# Patient Record
Sex: Female | Born: 1945
Health system: Southern US, Community
[De-identification: ages and names within clinical notes are randomized; demographics above are authoritative.]

## PROBLEM LIST (undated history)

## (undated) DIAGNOSIS — R609 Edema, unspecified: Secondary | ICD-10-CM

## (undated) DIAGNOSIS — J449 Chronic obstructive pulmonary disease, unspecified: Secondary | ICD-10-CM

## (undated) DIAGNOSIS — I509 Heart failure, unspecified: Secondary | ICD-10-CM

## (undated) DIAGNOSIS — R0902 Hypoxemia: Secondary | ICD-10-CM

## (undated) DIAGNOSIS — R29898 Other symptoms and signs involving the musculoskeletal system: Secondary | ICD-10-CM

## (undated) DIAGNOSIS — R062 Wheezing: Secondary | ICD-10-CM

## (undated) DIAGNOSIS — F419 Anxiety disorder, unspecified: Secondary | ICD-10-CM

## (undated) DIAGNOSIS — I10 Essential (primary) hypertension: Secondary | ICD-10-CM

## (undated) DIAGNOSIS — E039 Hypothyroidism, unspecified: Secondary | ICD-10-CM

## (undated) DIAGNOSIS — M1990 Unspecified osteoarthritis, unspecified site: Secondary | ICD-10-CM

## (undated) DIAGNOSIS — G2581 Restless legs syndrome: Secondary | ICD-10-CM

## (undated) DIAGNOSIS — M810 Age-related osteoporosis without current pathological fracture: Secondary | ICD-10-CM

## (undated) DIAGNOSIS — G4730 Sleep apnea, unspecified: Secondary | ICD-10-CM

## (undated) DIAGNOSIS — R0602 Shortness of breath: Secondary | ICD-10-CM

## (undated) HISTORY — PX: FOOT ARTHROPLASTY: SHX1657

## (undated) HISTORY — PX: BACK SURGERY: SHX140

## (undated) HISTORY — PX: JOINT REPLACEMENT: SHX530

## (undated) HISTORY — PX: BREAST BIOPSY: SHX20

---

## 2005-10-16 ENCOUNTER — Ambulatory Visit: Payer: Self-pay

## 2006-01-02 ENCOUNTER — Ambulatory Visit: Payer: Self-pay | Admitting: Internal Medicine

## 2006-02-19 ENCOUNTER — Inpatient Hospital Stay (HOSPITAL_COMMUNITY): Admission: RE | Admit: 2006-02-19 | Discharge: 2006-02-21 | Payer: Self-pay | Admitting: Neurosurgery

## 2007-10-10 ENCOUNTER — Ambulatory Visit: Payer: Self-pay

## 2008-12-01 ENCOUNTER — Ambulatory Visit: Payer: Self-pay

## 2009-01-20 ENCOUNTER — Ambulatory Visit: Payer: Self-pay | Admitting: General Practice

## 2009-02-04 ENCOUNTER — Ambulatory Visit: Payer: Self-pay | Admitting: General Practice

## 2009-08-08 ENCOUNTER — Ambulatory Visit: Payer: Self-pay | Admitting: General Practice

## 2009-08-22 ENCOUNTER — Inpatient Hospital Stay: Payer: Self-pay | Admitting: General Practice

## 2010-11-08 ENCOUNTER — Ambulatory Visit: Payer: Self-pay | Admitting: General Practice

## 2010-11-22 ENCOUNTER — Inpatient Hospital Stay: Payer: Self-pay | Admitting: General Practice

## 2011-02-28 ENCOUNTER — Ambulatory Visit: Payer: Self-pay | Admitting: Internal Medicine

## 2013-01-01 ENCOUNTER — Inpatient Hospital Stay: Payer: Self-pay | Admitting: Internal Medicine

## 2013-01-01 LAB — CK TOTAL AND CKMB (NOT AT ARMC): CK-MB: 2.1 ng/mL (ref 0.5–3.6)

## 2013-01-01 LAB — CBC WITH DIFFERENTIAL/PLATELET
Basophil #: 0 10*3/uL (ref 0.0–0.1)
Basophil %: 0.4 %
Eosinophil %: 2.3 %
HCT: 42.6 % (ref 35.0–47.0)
Lymphocyte %: 9.1 %
MCH: 29.5 pg (ref 26.0–34.0)
MCV: 89 fL (ref 80–100)
Neutrophil #: 8.3 10*3/uL — ABNORMAL HIGH (ref 1.4–6.5)
Platelet: 242 10*3/uL (ref 150–440)
RBC: 4.81 10*6/uL (ref 3.80–5.20)
WBC: 10.2 10*3/uL (ref 3.6–11.0)

## 2013-01-01 LAB — COMPREHENSIVE METABOLIC PANEL
Alkaline Phosphatase: 154 U/L — ABNORMAL HIGH (ref 50–136)
BUN: 17 mg/dL (ref 7–18)
Co2: 37 mmol/L — ABNORMAL HIGH (ref 21–32)
EGFR (African American): >60
Glucose: 101 mg/dL — ABNORMAL HIGH (ref 65–99)
Osmolality: 272 (ref 275–301)
Potassium: 2.9 mmol/L — ABNORMAL LOW (ref 3.5–5.1)
SGOT(AST): 74 U/L — ABNORMAL HIGH (ref 15–37)
SGPT (ALT): 49 U/L (ref 12–78)
Sodium: 136 mmol/L (ref 136–145)
Total Protein: 6.8 g/dL (ref 6.4–8.2)

## 2013-01-01 LAB — TSH: Thyroid Stimulating Horm: 1.66 u[IU]/mL

## 2013-01-02 LAB — URINALYSIS, COMPLETE
Bacteria: NONE SEEN
Blood: NEGATIVE
Glucose,UR: NEGATIVE mg/dL (ref 0–75)
Ketone: NEGATIVE
Leukocyte Esterase: NEGATIVE
Ph: 7 (ref 4.5–8.0)
Protein: NEGATIVE
RBC,UR: <1 /HPF (ref 0–5)
Specific Gravity: 1.014 (ref 1.003–1.030)
Squamous Epithelial: 1
WBC UR: 1 /HPF (ref 0–5)

## 2013-01-02 LAB — CBC WITH DIFFERENTIAL/PLATELET
Eosinophil #: 0 10*3/uL (ref 0.0–0.7)
Eosinophil %: 0.1 %
HCT: 44.5 % (ref 35.0–47.0)
HGB: 14.6 g/dL (ref 12.0–16.0)
Lymphocyte #: 0.4 10*3/uL — ABNORMAL LOW (ref 1.0–3.6)
MCHC: 32.9 g/dL (ref 32.0–36.0)
Monocyte %: 0.9 %
Neutrophil #: 11.7 10*3/uL — ABNORMAL HIGH (ref 1.4–6.5)
Neutrophil %: 94.2 %
RBC: 5 10*6/uL (ref 3.80–5.20)
RDW: 16.6 % — ABNORMAL HIGH (ref 11.5–14.5)

## 2013-01-02 LAB — CK TOTAL AND CKMB (NOT AT ARMC)
CK, Total: 25 U/L (ref 21–215)
CK-MB: 1.3 ng/mL (ref 0.5–3.6)

## 2013-01-02 LAB — BASIC METABOLIC PANEL
Anion Gap: 3 — ABNORMAL LOW (ref 7–16)
BUN: 16 mg/dL (ref 7–18)
EGFR (Non-African Amer.): >60
Glucose: 191 mg/dL — ABNORMAL HIGH (ref 65–99)
Osmolality: 276 (ref 275–301)
Sodium: 135 mmol/L — ABNORMAL LOW (ref 136–145)

## 2013-01-02 LAB — TROPONIN I
Troponin-I: 0.04 ng/mL
Troponin-I: <0.02 ng/mL

## 2013-01-03 LAB — BASIC METABOLIC PANEL
Calcium, Total: 8.5 mg/dL (ref 8.5–10.1)
Co2: 40 mmol/L (ref 21–32)
Creatinine: 0.9 mg/dL (ref 0.60–1.30)
EGFR (Non-African Amer.): >60
Sodium: 138 mmol/L (ref 136–145)

## 2013-01-04 LAB — CBC WITH DIFFERENTIAL/PLATELET
Basophil #: 0 10*3/uL (ref 0.0–0.1)
HCT: 41 % (ref 35.0–47.0)
HGB: 13.2 g/dL (ref 12.0–16.0)
MCH: 28.9 pg (ref 26.0–34.0)
MCV: 90 fL (ref 80–100)
Monocyte #: 0.7 x10 3/mm (ref 0.2–0.9)

## 2013-01-04 LAB — COMPREHENSIVE METABOLIC PANEL
Albumin: 3 g/dL — ABNORMAL LOW (ref 3.4–5.0)
Alkaline Phosphatase: 117 U/L (ref 50–136)
Anion Gap: 2 — ABNORMAL LOW (ref 7–16)
BUN: 22 mg/dL — ABNORMAL HIGH (ref 7–18)
Bilirubin,Total: 0.3 mg/dL (ref 0.2–1.0)
Co2: 40 mmol/L (ref 21–32)
Creatinine: 0.6 mg/dL (ref 0.60–1.30)
EGFR (Non-African Amer.): >60
Osmolality: 278 (ref 275–301)
SGOT(AST): 23 U/L (ref 15–37)
SGPT (ALT): 25 U/L (ref 12–78)
Sodium: 137 mmol/L (ref 136–145)

## 2013-01-04 LAB — MAGNESIUM: Magnesium: 2 mg/dL

## 2013-01-05 LAB — CBC WITH DIFFERENTIAL/PLATELET
Eosinophil #: 0.2 10*3/uL (ref 0.0–0.7)
Eosinophil %: 1.5 %
HCT: 42.2 % (ref 35.0–47.0)
HGB: 13.4 g/dL (ref 12.0–16.0)
MCV: 90 fL (ref 80–100)
Monocyte #: 1 x10 3/mm — ABNORMAL HIGH (ref 0.2–0.9)
Monocyte %: 8.5 %
Neutrophil #: 8.7 10*3/uL — ABNORMAL HIGH (ref 1.4–6.5)
Neutrophil %: 76.4 %
Platelet: 234 10*3/uL (ref 150–440)
RBC: 4.68 10*6/uL (ref 3.80–5.20)
RDW: 17 % — ABNORMAL HIGH (ref 11.5–14.5)
WBC: 11.4 10*3/uL — ABNORMAL HIGH (ref 3.6–11.0)

## 2013-01-05 LAB — BASIC METABOLIC PANEL
Anion Gap: 0 — ABNORMAL LOW (ref 7–16)
BUN: 25 mg/dL — ABNORMAL HIGH (ref 7–18)
Creatinine: 0.86 mg/dL (ref 0.60–1.30)
EGFR (African American): >60
EGFR (Non-African Amer.): >60
Osmolality: 286 (ref 275–301)
Potassium: 3.7 mmol/L (ref 3.5–5.1)
Sodium: 141 mmol/L (ref 136–145)

## 2013-01-06 LAB — CULTURE, BLOOD (SINGLE)

## 2013-05-29 ENCOUNTER — Ambulatory Visit: Payer: Self-pay | Admitting: Orthopedic Surgery

## 2013-06-30 ENCOUNTER — Other Ambulatory Visit (HOSPITAL_COMMUNITY): Payer: Self-pay

## 2013-07-01 ENCOUNTER — Encounter (HOSPITAL_COMMUNITY)
Admission: RE | Admit: 2013-07-01 | Discharge: 2013-07-01 | Disposition: A | Discharge status: Home / Self Care | Payer: Medicare HMO | Source: Ambulatory Visit | Attending: Orthopedic Surgery | Admitting: Orthopedic Surgery

## 2013-07-01 ENCOUNTER — Ambulatory Visit (HOSPITAL_COMMUNITY)
Admission: RE | Admit: 2013-07-01 | Discharge: 2013-07-01 | Disposition: A | Discharge status: Home / Self Care | Payer: Medicare HMO | Source: Ambulatory Visit | Attending: Orthopedic Surgery | Admitting: Orthopedic Surgery

## 2013-07-01 ENCOUNTER — Encounter (HOSPITAL_COMMUNITY): Payer: Self-pay

## 2013-07-01 ENCOUNTER — Encounter (HOSPITAL_COMMUNITY): Payer: Self-pay | Admitting: Pharmacy Technician

## 2013-07-01 DIAGNOSIS — Z01818 Encounter for other preprocedural examination: Secondary | ICD-10-CM | POA: Insufficient documentation

## 2013-07-01 HISTORY — DX: Sleep apnea, unspecified: G47.30

## 2013-07-01 HISTORY — DX: Unspecified osteoarthritis, unspecified site: M19.90

## 2013-07-01 HISTORY — DX: Hypothyroidism, unspecified: E03.9

## 2013-07-01 HISTORY — DX: Restless legs syndrome: G25.81

## 2013-07-01 HISTORY — DX: Heart failure, unspecified: I50.9

## 2013-07-01 HISTORY — DX: Essential (primary) hypertension: I10

## 2013-07-01 HISTORY — DX: Anxiety disorder, unspecified: F41.9

## 2013-07-01 HISTORY — DX: Shortness of breath: R06.02

## 2013-07-01 LAB — CBC WITH DIFFERENTIAL/PLATELET
Basophils Absolute: 0 10*3/uL (ref 0.0–0.1)
Basophils Relative: 0 % (ref 0–1)
Eosinophils Absolute: 0.2 10*3/uL (ref 0.0–0.7)
Eosinophils Relative: 2 % (ref 0–5)
HCT: 45.2 % (ref 36.0–46.0)
Hemoglobin: 14.8 g/dL (ref 12.0–15.0)
Lymphocytes Relative: 12 % (ref 12–46)
Lymphs Abs: 1 10*3/uL (ref 0.7–4.0)
MCH: 31.2 pg (ref 26.0–34.0)
MCHC: 32.7 g/dL (ref 30.0–36.0)
MCV: 95.2 fL (ref 78.0–100.0)
Monocytes Absolute: 0.5 10*3/uL (ref 0.1–1.0)
Monocytes Relative: 6 % (ref 3–12)
Neutro Abs: 6.9 10*3/uL (ref 1.7–7.7)
Neutrophils Relative %: 80 % — ABNORMAL HIGH (ref 43–77)
Platelets: 224 10*3/uL (ref 150–400)
RBC: 4.75 MIL/uL (ref 3.87–5.11)
RDW: 14.4 % (ref 11.5–15.5)
WBC: 8.7 10*3/uL (ref 4.0–10.5)

## 2013-07-01 LAB — COMPREHENSIVE METABOLIC PANEL
ALT: 13 U/L (ref 0–35)
AST: 18 U/L (ref 0–37)
Albumin: 4.1 g/dL (ref 3.5–5.2)
Alkaline Phosphatase: 149 U/L — ABNORMAL HIGH (ref 39–117)
BUN: 22 mg/dL (ref 6–23)
CO2: 30 mEq/L (ref 19–32)
Calcium: 10.3 mg/dL (ref 8.4–10.5)
Chloride: 101 mEq/L (ref 96–112)
Creatinine, Ser: 0.73 mg/dL (ref 0.50–1.10)
GFR calc Af Amer: >90 mL/min (ref >90)
GFR calc non Af Amer: 87 mL/min — ABNORMAL LOW (ref >90)
Glucose, Bld: 99 mg/dL (ref 70–99)
Potassium: 4.7 mEq/L (ref 3.7–5.3)
Sodium: 144 mEq/L (ref 137–147)
Total Bilirubin: 0.4 mg/dL (ref 0.3–1.2)
Total Protein: 7.2 g/dL (ref 6.0–8.3)

## 2013-07-01 LAB — PROTIME-INR
INR: 0.99 (ref 0.00–1.49)
Prothrombin Time: 12.9 seconds (ref 11.6–15.2)

## 2013-07-01 LAB — TYPE AND SCREEN
ABO/RH(D): O NEG
ANTIBODY SCREEN: NEGATIVE

## 2013-07-01 LAB — APTT: aPTT: 31 seconds (ref 24–37)

## 2013-07-01 LAB — ABO/RH: ABO/RH(D): O NEG

## 2013-07-01 MED ORDER — LACTATED RINGERS IV SOLN
INTRAVENOUS | Status: DC
  Administered 2013-07-02 (×2): via INTRAVENOUS

## 2013-07-01 MED ORDER — CHLORHEXIDINE GLUCONATE 4 % EX LIQD
60.0000 mL | Freq: Once | CUTANEOUS | Status: DC
  Filled 2013-07-01: qty 60

## 2013-07-01 MED ORDER — DEXTROSE 5 % IV SOLN
3.0000 g | INTRAVENOUS | Status: AC
  Administered 2013-07-02: 3 g via INTRAVENOUS
  Filled 2013-07-01: qty 3000

## 2013-07-01 NOTE — Pre-Procedure Instructions (Signed)
Hannah Powell  07/01/2013   Your procedure is scheduled on:  07/02/13  Report to [place]  2 * 3 at 530 AM.  Call this number if you have problems the morning of surgery: [phone]   Remember:   Do not eat food or drink liquids after midnight.   Take these medicines the morning of surgery with A SIP OF WATER: xanax,all inhalers,synthroid   Do not wear jewelry, make-up or nail polish.  Do not wear lotions, powders, or perfumes. You may wear deodorant.  Do not shave 48 hours prior to surgery. Men may shave face and neck.  Do not bring valuables to the hospital.  [facility] is not responsible                  for any belongings or valuables.               Contacts, dentures or bridgework may not be worn into surgery.  Leave suitcase in the car. After surgery it may be brought to your room.  For patients admitted to the hospital, discharge time is determined by your                treatment team.               Patients discharged the day of surgery will not be allowed to drive  home.  Name and phone number of your driver: family  Special Instructions: Incentive Spirometry - Practice and bring it with you on the day of surgery.   Please read over the following fact sheets that you were given: Pain Booklet, Coughing and Deep Breathing, Blood Transfusion Information and Surgical Site Infection Prevention

## 2013-07-01 NOTE — Progress Notes (Signed)
[facility] called for Ekg,stess, cxr

## 2013-07-02 ENCOUNTER — Encounter (HOSPITAL_COMMUNITY): Payer: Medicare HMO | Admitting: Anesthesiology

## 2013-07-02 ENCOUNTER — Encounter (HOSPITAL_COMMUNITY)
Admission: RE | Disposition: A | Discharge status: Home / Self Care | Payer: Self-pay | Source: Ambulatory Visit | Attending: Orthopedic Surgery

## 2013-07-02 ENCOUNTER — Inpatient Hospital Stay (HOSPITAL_COMMUNITY)
Admission: RE | Admit: 2013-07-02 | Discharge: 2013-07-03 | DRG: 483 | Disposition: A | Discharge status: Home / Self Care | Payer: Medicare HMO | Source: Ambulatory Visit | Attending: Orthopedic Surgery | Admitting: Orthopedic Surgery

## 2013-07-02 ENCOUNTER — Encounter (HOSPITAL_COMMUNITY): Payer: Self-pay | Admitting: Anesthesiology

## 2013-07-02 ENCOUNTER — Inpatient Hospital Stay (HOSPITAL_COMMUNITY): Payer: Medicare HMO | Admitting: Anesthesiology

## 2013-07-02 DIAGNOSIS — Z79899 Other long term (current) drug therapy: Secondary | ICD-10-CM

## 2013-07-02 DIAGNOSIS — I10 Essential (primary) hypertension: Secondary | ICD-10-CM | POA: Diagnosis present

## 2013-07-02 DIAGNOSIS — G4730 Sleep apnea, unspecified: Secondary | ICD-10-CM | POA: Diagnosis present

## 2013-07-02 DIAGNOSIS — E039 Hypothyroidism, unspecified: Secondary | ICD-10-CM | POA: Diagnosis present

## 2013-07-02 DIAGNOSIS — Z7982 Long term (current) use of aspirin: Secondary | ICD-10-CM

## 2013-07-02 DIAGNOSIS — Z96619 Presence of unspecified artificial shoulder joint: Secondary | ICD-10-CM

## 2013-07-02 DIAGNOSIS — Z96659 Presence of unspecified artificial knee joint: Secondary | ICD-10-CM

## 2013-07-02 DIAGNOSIS — G2581 Restless legs syndrome: Secondary | ICD-10-CM | POA: Diagnosis present

## 2013-07-02 DIAGNOSIS — I509 Heart failure, unspecified: Secondary | ICD-10-CM | POA: Diagnosis present

## 2013-07-02 DIAGNOSIS — M19019 Primary osteoarthritis, unspecified shoulder: Principal | ICD-10-CM | POA: Diagnosis present

## 2013-07-02 HISTORY — PX: TOTAL SHOULDER ARTHROPLASTY: SHX126

## 2013-07-02 SURGERY — ARTHROPLASTY, SHOULDER, TOTAL
Anesthesia: General | Site: Shoulder | Laterality: Left

## 2013-07-02 MED ORDER — ONDANSETRON HCL 4 MG/2ML IJ SOLN: 4.0000 mg | Freq: Four times a day (QID) | INTRAMUSCULAR | Status: DC | PRN

## 2013-07-02 MED ORDER — HYDROMORPHONE HCL PF 1 MG/ML IJ SOLN
0.2500 mg | INTRAMUSCULAR | Status: DC | PRN
  Administered 2013-07-03: 1 mg via INTRAVENOUS
  Filled 2013-07-02: qty 1

## 2013-07-02 MED ORDER — ASPIRIN EC 81 MG PO TBEC
81.0000 mg | DELAYED_RELEASE_TABLET | Freq: Every day | ORAL | Status: DC
  Administered 2013-07-02 – 2013-07-03 (×2): 81 mg via ORAL
  Filled 2013-07-02 (×2): qty 1

## 2013-07-02 MED ORDER — ONDANSETRON HCL 4 MG/2ML IJ SOLN: 4.0000 mg | Freq: Once | INTRAMUSCULAR | Status: DC | PRN

## 2013-07-02 MED ORDER — MIDAZOLAM HCL 2 MG/2ML IJ SOLN
INTRAMUSCULAR | Status: AC
  Filled 2013-07-02: qty 2

## 2013-07-02 MED ORDER — ROCURONIUM BROMIDE 100 MG/10ML IV SOLN
INTRAVENOUS | Status: DC | PRN
  Administered 2013-07-02: 50 mg via INTRAVENOUS

## 2013-07-02 MED ORDER — FLUTICASONE FUROATE-VILANTEROL 100-25 MCG/INH IN AEPB: 1.0000 | INHALATION_SPRAY | Freq: Every day | RESPIRATORY_TRACT | Status: DC

## 2013-07-02 MED ORDER — GLYCOPYRROLATE 0.2 MG/ML IJ SOLN
INTRAMUSCULAR | Status: AC
  Filled 2013-07-02: qty 1

## 2013-07-02 MED ORDER — FUROSEMIDE 40 MG PO TABS
40.0000 mg | ORAL_TABLET | Freq: Two times a day (BID) | ORAL | Status: DC | PRN
  Filled 2013-07-02: qty 1

## 2013-07-02 MED ORDER — BISACODYL 5 MG PO TBEC
5.0000 mg | DELAYED_RELEASE_TABLET | Freq: Every day | ORAL | Status: DC | PRN
Start: 2013-07-02 — End: 2013-07-03

## 2013-07-02 MED ORDER — ALPRAZOLAM 0.5 MG PO TABS: 0.5000 mg | ORAL_TABLET | Freq: Every day | ORAL | Status: DC | PRN

## 2013-07-02 MED ORDER — OXYCODONE HCL 5 MG PO TABS: 5.0000 mg | ORAL_TABLET | Freq: Once | ORAL | Status: DC | PRN

## 2013-07-02 MED ORDER — PHENOL 1.4 % MT LIQD: 1.0000 | OROMUCOSAL | Status: DC | PRN

## 2013-07-02 MED ORDER — ROCURONIUM BROMIDE 50 MG/5ML IV SOLN
INTRAVENOUS | Status: AC
  Filled 2013-07-02: qty 1

## 2013-07-02 MED ORDER — DOCUSATE SODIUM 100 MG PO CAPS
100.0000 mg | ORAL_CAPSULE | Freq: Two times a day (BID) | ORAL | Status: DC
  Filled 2013-07-02 (×3): qty 1

## 2013-07-02 MED ORDER — OXYCODONE HCL 5 MG/5ML PO SOLN: 5.0000 mg | Freq: Once | ORAL | Status: DC | PRN

## 2013-07-02 MED ORDER — GLYCOPYRROLATE 0.2 MG/ML IJ SOLN
INTRAMUSCULAR | Status: DC | PRN
  Administered 2013-07-02: 0.2 mg via INTRAVENOUS
  Administered 2013-07-02: .5 mg via INTRAVENOUS

## 2013-07-02 MED ORDER — DEXAMETHASONE SODIUM PHOSPHATE 4 MG/ML IJ SOLN
INTRAMUSCULAR | Status: DC | PRN
  Administered 2013-07-02: 4 mg via INTRAVENOUS

## 2013-07-02 MED ORDER — DIPHENHYDRAMINE HCL 12.5 MG/5ML PO ELIX: 12.5000 mg | ORAL_SOLUTION | ORAL | Status: DC | PRN

## 2013-07-02 MED ORDER — LIDOCAINE HCL (CARDIAC) 20 MG/ML IV SOLN
INTRAVENOUS | Status: AC
  Filled 2013-07-02: qty 5

## 2013-07-02 MED ORDER — LEVOTHYROXINE SODIUM 200 MCG PO TABS
200.0000 ug | ORAL_TABLET | Freq: Every day | ORAL | Status: DC
  Administered 2013-07-03: 200 ug via ORAL
  Filled 2013-07-02 (×2): qty 1

## 2013-07-02 MED ORDER — FLEET ENEMA 7-19 GM/118ML RE ENEM: 1.0000 | ENEMA | Freq: Once | RECTAL | Status: AC | PRN

## 2013-07-02 MED ORDER — DIAZEPAM 5 MG PO TABS
2.5000 mg | ORAL_TABLET | Freq: Four times a day (QID) | ORAL | Status: DC | PRN
  Administered 2013-07-03: 5 mg via ORAL
  Filled 2013-07-02 (×2): qty 1

## 2013-07-02 MED ORDER — MIDAZOLAM HCL 5 MG/5ML IJ SOLN
INTRAMUSCULAR | Status: DC | PRN
  Administered 2013-07-02: 2 mg via INTRAVENOUS

## 2013-07-02 MED ORDER — EPHEDRINE SULFATE 50 MG/ML IJ SOLN
INTRAMUSCULAR | Status: DC | PRN
  Administered 2013-07-02: 5 mg via INTRAVENOUS
  Administered 2013-07-02: 10 mg via INTRAVENOUS
  Administered 2013-07-02: 5 mg via INTRAVENOUS
  Administered 2013-07-02: 10 mg via INTRAVENOUS
  Administered 2013-07-02: 5 mg via INTRAVENOUS

## 2013-07-02 MED ORDER — LOSARTAN POTASSIUM 25 MG PO TABS
25.0000 mg | ORAL_TABLET | Freq: Every day | ORAL | Status: DC
  Administered 2013-07-03: 25 mg via ORAL
  Filled 2013-07-02 (×2): qty 1

## 2013-07-02 MED ORDER — ONDANSETRON HCL 4 MG/2ML IJ SOLN
INTRAMUSCULAR | Status: DC | PRN
  Administered 2013-07-02: 4 mg via INTRAVENOUS

## 2013-07-02 MED ORDER — FENTANYL CITRATE 0.05 MG/ML IJ SOLN: 25.0000 ug | INTRAMUSCULAR | Status: DC | PRN

## 2013-07-02 MED ORDER — FENTANYL CITRATE 0.05 MG/ML IJ SOLN
INTRAMUSCULAR | Status: AC
  Filled 2013-07-02: qty 5

## 2013-07-02 MED ORDER — POTASSIUM CHLORIDE CRYS ER 20 MEQ PO TBCR: 20.0000 meq | EXTENDED_RELEASE_TABLET | Freq: Two times a day (BID) | ORAL | Status: DC | PRN

## 2013-07-02 MED ORDER — PRAMIPEXOLE DIHYDROCHLORIDE 1.5 MG PO TABS
3.0000 mg | ORAL_TABLET | Freq: Every day | ORAL | Status: DC
  Administered 2013-07-02: 3 mg via ORAL
  Filled 2013-07-02 (×2): qty 2

## 2013-07-02 MED ORDER — FENTANYL CITRATE 0.05 MG/ML IJ SOLN
INTRAMUSCULAR | Status: DC | PRN
  Administered 2013-07-02 (×2): 50 ug via INTRAVENOUS

## 2013-07-02 MED ORDER — KETOROLAC TROMETHAMINE 15 MG/ML IJ SOLN
15.0000 mg | Freq: Four times a day (QID) | INTRAMUSCULAR | Status: DC
  Administered 2013-07-02 – 2013-07-03 (×4): 15 mg via INTRAVENOUS
  Filled 2013-07-02 (×8): qty 1

## 2013-07-02 MED ORDER — LACTATED RINGERS IV SOLN
INTRAVENOUS | Status: DC
  Administered 2013-07-02: 19:00:00 via INTRAVENOUS

## 2013-07-02 MED ORDER — METOCLOPRAMIDE HCL 5 MG/ML IJ SOLN
5.0000 mg | Freq: Three times a day (TID) | INTRAMUSCULAR | Status: DC | PRN
Start: 2013-07-02 — End: 2013-07-03

## 2013-07-02 MED ORDER — PHENYLEPHRINE HCL 10 MG/ML IJ SOLN
10.0000 mg | INTRAMUSCULAR | Status: DC | PRN
  Administered 2013-07-02: 25 ug/min via INTRAVENOUS

## 2013-07-02 MED ORDER — CEFAZOLIN SODIUM 1-5 GM-% IV SOLN
1.0000 g | Freq: Four times a day (QID) | INTRAVENOUS | Status: AC
  Administered 2013-07-02 – 2013-07-03 (×3): 1 g via INTRAVENOUS
  Filled 2013-07-02 (×4): qty 50

## 2013-07-02 MED ORDER — ALUM & MAG HYDROXIDE-SIMETH 200-200-20 MG/5ML PO SUSP: 30.0000 mL | ORAL | Status: DC | PRN

## 2013-07-02 MED ORDER — ACETAMINOPHEN 650 MG RE SUPP: 650.0000 mg | Freq: Four times a day (QID) | RECTAL | Status: DC | PRN

## 2013-07-02 MED ORDER — METOCLOPRAMIDE HCL 10 MG PO TABS
5.0000 mg | ORAL_TABLET | Freq: Three times a day (TID) | ORAL | Status: DC | PRN
Start: 2013-07-02 — End: 2013-07-03

## 2013-07-02 MED ORDER — GLYCOPYRROLATE 0.2 MG/ML IJ SOLN
INTRAMUSCULAR | Status: AC
  Filled 2013-07-02: qty 2

## 2013-07-02 MED ORDER — ONDANSETRON HCL 4 MG PO TABS: 4.0000 mg | ORAL_TABLET | Freq: Four times a day (QID) | ORAL | Status: DC | PRN

## 2013-07-02 MED ORDER — PROPOFOL 10 MG/ML IV BOLUS
INTRAVENOUS | Status: DC | PRN
  Administered 2013-07-02: 140 mg via INTRAVENOUS

## 2013-07-02 MED ORDER — LIDOCAINE HCL (CARDIAC) 20 MG/ML IV SOLN
INTRAVENOUS | Status: DC | PRN
  Administered 2013-07-02: 30 mg via INTRAVENOUS

## 2013-07-02 MED ORDER — ONDANSETRON HCL 4 MG/2ML IJ SOLN
INTRAMUSCULAR | Status: AC
  Filled 2013-07-02: qty 2

## 2013-07-02 MED ORDER — OXYCODONE-ACETAMINOPHEN 5-325 MG PO TABS: 1.0000 | ORAL_TABLET | ORAL | Status: DC | PRN

## 2013-07-02 MED ORDER — HYDROCODONE-ACETAMINOPHEN 5-325 MG PO TABS
1.0000 | ORAL_TABLET | ORAL | Status: DC | PRN
  Administered 2013-07-03 (×2): 2 via ORAL
  Filled 2013-07-02 (×2): qty 2

## 2013-07-02 MED ORDER — MENTHOL 3 MG MT LOZG
1.0000 | LOZENGE | OROMUCOSAL | Status: DC | PRN
  Filled 2013-07-02: qty 9

## 2013-07-02 MED ORDER — DEXAMETHASONE SODIUM PHOSPHATE 4 MG/ML IJ SOLN
INTRAMUSCULAR | Status: AC
  Filled 2013-07-02: qty 1

## 2013-07-02 MED ORDER — NEOSTIGMINE METHYLSULFATE 1 MG/ML IJ SOLN
INTRAMUSCULAR | Status: DC | PRN
  Administered 2013-07-02: 3 mg via INTRAVENOUS

## 2013-07-02 MED ORDER — PROPOFOL 10 MG/ML IV BOLUS
INTRAVENOUS | Status: AC
  Filled 2013-07-02: qty 20

## 2013-07-02 MED ORDER — ACETAMINOPHEN 325 MG PO TABS: 650.0000 mg | ORAL_TABLET | Freq: Four times a day (QID) | ORAL | Status: DC | PRN

## 2013-07-02 MED ORDER — POLYETHYLENE GLYCOL 3350 17 G PO PACK: 17.0000 g | PACK | Freq: Every day | ORAL | Status: DC | PRN

## 2013-07-02 SURGICAL SUPPLY — 65 items
ARM SLING XL ×1 IMPLANT
BLADE SAW SGTL 83.5X18.5 (BLADE) ×2 IMPLANT
CEMENT HV SMART SET (Cement) ×1 IMPLANT
COVER SURGICAL LIGHT HANDLE (MISCELLANEOUS) ×2 IMPLANT
DRAPE INCISE IOBAN 66X45 STRL (DRAPES) ×2 IMPLANT
DRAPE SURG 17X11 SM STRL (DRAPES) ×2 IMPLANT
DRAPE SURG 17X23 STRL (DRAPES) ×2 IMPLANT
DRAPE U-SHAPE 47X51 STRL (DRAPES) ×2 IMPLANT
DRILL BIT 7/64X5 (BIT) IMPLANT
DRSG AQUACEL AG ADV 3.5X10 (GAUZE/BANDAGES/DRESSINGS) ×2 IMPLANT
DRSG MEPILEX BORDER 4X4 (GAUZE/BANDAGES/DRESSINGS) ×2 IMPLANT
DRSG MEPILEX BORDER 4X8 (GAUZE/BANDAGES/DRESSINGS) ×2 IMPLANT
DURAPREP 26ML APPLICATOR (WOUND CARE) ×4 IMPLANT
ELECT BLADE 4.0 EZ CLEAN MEGAD (MISCELLANEOUS) ×2
ELECT CAUTERY BLADE 6.4 (BLADE) ×2 IMPLANT
ELECT REM PT RETURN 9FT ADLT (ELECTROSURGICAL) ×2
ELECTRODE BLDE 4.0 EZ CLN MEGD (MISCELLANEOUS) ×1 IMPLANT
ELECTRODE REM PT RTRN 9FT ADLT (ELECTROSURGICAL) ×1 IMPLANT
EVACUATOR 1/8 PVC DRAIN (DRAIN) ×1 IMPLANT
FACESHIELD WRAPAROUND (MASK) ×6 IMPLANT
FACESHIELD WRAPAROUND OR TEAM (MASK) ×3 IMPLANT
GLENOID ANCHOR PEG CROSSLK 44 (Orthopedic Implant) ×1 IMPLANT
GLOVE BIO SURGEON STRL SZ7.5 (GLOVE) ×2 IMPLANT
GLOVE BIO SURGEON STRL SZ8 (GLOVE) ×2 IMPLANT
GLOVE EUDERMIC 7 POWDERFREE (GLOVE) ×2 IMPLANT
GLOVE SS BIOGEL STRL SZ 7.5 (GLOVE) ×1 IMPLANT
GLOVE SUPERSENSE BIOGEL SZ 7.5 (GLOVE) ×1
GOWN STRL REUS W/ TWL LRG LVL3 (GOWN DISPOSABLE) ×1 IMPLANT
GOWN STRL REUS W/ TWL XL LVL3 (GOWN DISPOSABLE) ×2 IMPLANT
GOWN STRL REUS W/TWL LRG LVL3 (GOWN DISPOSABLE) ×2
GOWN STRL REUS W/TWL XL LVL3 (GOWN DISPOSABLE) ×4
HEAD HUM ECCENTRIC 48X18 STRL (Trauma) ×1 IMPLANT
HUMERAL STEM 14MM (Trauma) ×1 IMPLANT
KIT BASIN OR (CUSTOM PROCEDURE TRAY) ×2 IMPLANT
KIT ROOM TURNOVER OR (KITS) ×2 IMPLANT
MANIFOLD NEPTUNE II (INSTRUMENTS) ×2 IMPLANT
NDL HYPO 25GX1X1/2 BEV (NEEDLE) IMPLANT
NDL SUT 6 .5 CRC .975X.05 MAYO (NEEDLE) ×1 IMPLANT
NEEDLE HYPO 25GX1X1/2 BEV (NEEDLE) IMPLANT
NEEDLE MAYO TAPER (NEEDLE) ×2
NS IRRIG 1000ML POUR BTL (IV SOLUTION) ×2 IMPLANT
PACK SHOULDER (CUSTOM PROCEDURE TRAY) ×2 IMPLANT
PAD ARMBOARD 7.5X6 YLW CONV (MISCELLANEOUS) ×4 IMPLANT
PASSER SUT SWANSON 36MM LOOP (INSTRUMENTS) IMPLANT
PIN METAGLENE 2.5 (PIN) ×2 IMPLANT
SLING ARM LRG ADULT FOAM STRAP (SOFTGOODS) IMPLANT
SLING ARM XL FOAM STRAP (SOFTGOODS) ×2 IMPLANT
SMARTMIX MINI TOWER (MISCELLANEOUS) ×2
SPONGE LAP 18X18 X RAY DECT (DISPOSABLE) ×2 IMPLANT
SPONGE LAP 4X18 X RAY DECT (DISPOSABLE) ×2 IMPLANT
STRIP CLOSURE SKIN 1/2X4 (GAUZE/BANDAGES/DRESSINGS) ×2 IMPLANT
SUCTION FRAZIER TIP 10 FR DISP (SUCTIONS) ×2 IMPLANT
SUT BONE WAX W31G (SUTURE) IMPLANT
SUT FIBERWIRE #2 38 T-5 BLUE (SUTURE) ×4
SUT MNCRL AB 3-0 PS2 18 (SUTURE) ×2 IMPLANT
SUT VIC AB 1 CT1 27 (SUTURE) ×6
SUT VIC AB 1 CT1 27XBRD ANBCTR (SUTURE) ×3 IMPLANT
SUT VIC AB 2-0 CT1 27 (SUTURE) ×4
SUT VIC AB 2-0 CT1 TAPERPNT 27 (SUTURE) ×2 IMPLANT
SUTURE FIBERWR #2 38 T-5 BLUE (SUTURE) ×2 IMPLANT
SYR CONTROL 10ML LL (SYRINGE) IMPLANT
TOWEL OR 17X24 6PK STRL BLUE (TOWEL DISPOSABLE) ×2 IMPLANT
TOWEL OR 17X26 10 PK STRL BLUE (TOWEL DISPOSABLE) ×2 IMPLANT
TOWER SMARTMIX MINI (MISCELLANEOUS) ×1 IMPLANT
WATER STERILE IRR 1000ML POUR (IV SOLUTION) ×2 IMPLANT

## 2013-07-02 NOTE — H&P (Signed)
Hannah Powell    Chief Complaint: OA OF LEFT SHOULDER HPI: The patient is a 68 y.o. female with end stage left shoulder OA  Past Medical History  Diagnosis Date  . CHF (congestive heart failure)   . Shortness of breath   . Hypertension     dr Otho Najjar     . Sleep apnea     cpap      >5 yrs  . Hypothyroidism   . Anxiety   . Arthritis   . RLS (restless legs syndrome)     Past Surgical History  Procedure Laterality Date  . Back surgery      cervical  . Joint replacement      x2 tkr  . Foot arthroplasty      No family history on file.  Social History:  reports that she has never smoked. She does not have any smokeless tobacco history on file. She reports that she drinks alcohol. She reports that she does not use illicit drugs.  Allergies: No Known Allergies  Medications Prior to Admission  Medication Sig Dispense Refill  . ALPRAZolam (XANAX) 0.5 MG tablet Take 0.5 mg by mouth daily as needed for anxiety.      Marland Kitchen aspirin EC 81 MG tablet Take 81 mg by mouth daily.      . B Complex-C (B-COMPLEX WITH VITAMIN C) tablet Take 1 tablet by mouth daily.      . Calcium Carbonate-Vitamin D (CALCIUM-VITAMIN D) 500-200 MG-UNIT per tablet Take 1 tablet by mouth daily.      . Fluticasone Furoate-Vilanterol (BREO ELLIPTA) 100-25 MCG/INH AEPB Inhale 1 puff into the lungs daily.      . furosemide (LASIX) 40 MG tablet Take 40 mg by mouth 2 (two) times daily as needed for fluid (Takes once or twice daily depending on fluid levels).      Marland Kitchen levothyroxine (SYNTHROID, LEVOTHROID) 200 MCG tablet Take 200 mcg by mouth daily before breakfast.      . losartan (COZAAR) 25 MG tablet Take 25 mg by mouth daily.      . Omega-3 Fatty Acids (OMEGA 3 PO) Take 1 capsule by mouth daily.      . potassium chloride SA (K-DUR,KLOR-CON) 20 MEQ tablet Take 20 mEq by mouth 2 (two) times daily as needed (for fluid. Takes once of twice daily depending on how many furosemide she takes.).      Marland Kitchen pramipexole (MIRAPEX) 1 MG  tablet Take 3 mg by mouth at bedtime.         Physical Exam: left shoulder with painful and restricted motion as noted at recent office visits  Vitals  Temp:  [98.4 F (36.9 C)] 98.4 F (36.9 C) (04/16 0610) Pulse Rate:  [81-91] 81 (04/16 0610) Resp:  [18] 18 (04/16 0610) BP: (135-151)/(75-96) 135/75 mmHg (04/16 0610) SpO2:  [94 %-100 %] 100 % (04/16 0610) Weight:  [123.801 kg (272 lb 14.9 oz)] 123.801 kg (272 lb 14.9 oz) (04/16 0610)  Assessment/Plan  Impression: OA OF LEFT SHOULDER  Plan of Action: Procedure(s): LEFT TOTAL SHOULDER ARTHROPLASTY  Metta Clines Raffael Bugarin 07/02/2013, 7:10 AM

## 2013-07-02 NOTE — Op Note (Signed)
07/02/2013  9:43 AM  PATIENT:   Ronal Fear  68 y.o. female  PRE-OPERATIVE DIAGNOSIS:  OA OF LEFT SHOULDER  POST-OPERATIVE DIAGNOSIS:  same  PROCEDURE:  Left total shoulder arthroplasty, #14 stem, 48x18 eccentric head, 44 glenoid  SURGEON:  Elliett Guarisco, Metta Clines M.D.  ASSISTANTS: Shuford pac   ANESTHESIA:   GET + ISB  EBL: 250  SPECIMEN:  none  Drains: hemovac x 1   PATIENT DISPOSITION:  PACU - hemodynamically stable.    PLAN OF CARE: Admit to inpatient   Dictation# [id number]

## 2013-07-02 NOTE — Anesthesia Procedure Notes (Addendum)
Anesthesia Regional Block:  Interscalene brachial plexus block  Pre-Anesthetic Checklist: ,, timeout performed, Correct Patient, Correct Site, Correct Laterality, Correct Procedure, Correct Position, site marked, Risks and benefits discussed,  Surgical consent,  Pre-op evaluation,  At surgeon's request and post-op pain management  Laterality: Left  Prep: chloraprep       Needles:  Injection technique: Single-shot  Needle Type: Echogenic Stimulator Needle     Needle Length: 9cm 9 cm Needle Gauge: 22 and 22 G    Additional Needles:  Procedures: ultrasound guided (picture in chart) and nerve stimulator Interscalene brachial plexus block Narrative:  Start time: 07/02/2013 7:10 AM End time: 07/02/2013 7:15 AM Injection made incrementally with aspirations every 5 mL.  Performed by: Personally   Additional Notes: 20 cc 0.5% naropin injected easily   Procedure Name: Intubation Date/Time: 07/02/2013 7:38 AM Performed by: Rush Farmer E Pre-anesthesia Checklist: Patient identified, Emergency Drugs available, Suction available, Patient being monitored and Timeout performed Patient Re-evaluated:Patient Re-evaluated prior to inductionOxygen Delivery Method: Circle system utilized Preoxygenation: Pre-oxygenation with 100% oxygen Intubation Type: IV induction Ventilation: Mask ventilation without difficulty and Oral airway inserted - appropriate to patient size Laryngoscope Size: Mac and 4 Grade View: Grade II Tube type: Oral Tube size: 7.5 mm Number of attempts: 2 Airway Equipment and Method: Stylet Placement Confirmation: ETT inserted through vocal cords under direct vision,  positive ETCO2 and breath sounds checked- equal and bilateral Secured at: 21 cm Tube secured with: Tape Dental Injury: Teeth and Oropharynx as per pre-operative assessment and Injury to tongue  Comments: DL x 1 with MAC 4, Grade II view. Unable to advance ETT per CRNA. Same DL, MDA advanced ETT. +ETCO2 & BSS =.

## 2013-07-02 NOTE — Progress Notes (Signed)
Utilization review completed.  

## 2013-07-02 NOTE — Anesthesia Preprocedure Evaluation (Addendum)
Anesthesia Evaluation  Patient identified by MRN, date of birth, ID band Patient awake    Reviewed: Allergy & Precautions, H&P , NPO status , Patient's Chart, lab work & pertinent test results  Airway Mallampati: I TM Distance: >3 FB     Dental  (+) Teeth Intact, Caps   Pulmonary shortness of breath and with exertion, sleep apnea and Continuous Positive Airway Pressure Ventilation ,  breath sounds clear to auscultation        Cardiovascular hypertension, Rhythm:Regular Rate:Normal     Neuro/Psych    GI/Hepatic   Endo/Other  Hypothyroidism   Renal/GU      Musculoskeletal   Abdominal (+) + obese,   Peds  Hematology   Anesthesia Other Findings   Reproductive/Obstetrics                          Anesthesia Physical Anesthesia Plan  ASA: III  Anesthesia Plan: General   Post-op Pain Management:    Induction: Intravenous  Airway Management Planned: Oral ETT  Additional Equipment:   Intra-op Plan:   Post-operative Plan: Extubation in OR  Informed Consent: I have reviewed the patients History and Physical, chart, labs and discussed the procedure including the risks, benefits and alternatives for the proposed anesthesia with the patient or authorized representative who has indicated his/her understanding and acceptance.   Dental advisory given  Plan Discussed with: CRNA and Anesthesiologist  Anesthesia Plan Comments: (DJD L. Shoulder Obesity Htn H/O CHF per patient, (-) stress test at [facility] EF 55% per echo, no orthopnea, lungs clear  Plan GA with oral ETT and interscalene block with Ropivacaine  Roberts Gaudy, MD)       Anesthesia Quick Evaluation

## 2013-07-02 NOTE — Progress Notes (Signed)
Report given to maryann rn as caregiver 

## 2013-07-02 NOTE — Anesthesia Postprocedure Evaluation (Signed)
  Anesthesia Post-op Note  Patient: Hannah Powell  Procedure(s) Performed: Procedure(s): LEFT TOTAL SHOULDER ARTHROPLASTY (Left)  Patient Location: PACU  Anesthesia Type:General and GA combined with regional for post-op pain  Level of Consciousness: awake, alert  and oriented  Airway and Oxygen Therapy: Patient Spontanous Breathing and Patient connected to nasal cannula oxygen  Post-op Pain: none  Post-op Assessment: Post-op Vital signs reviewed, Patient's Cardiovascular Status Stable, Respiratory Function Stable, Patent Airway and Pain level controlled  Post-op Vital Signs: stable  Last Vitals:  Filed Vitals:   07/02/13 1208  BP: 100/52  Pulse: 83  Temp:   Resp:     Complications: No apparent anesthesia complications

## 2013-07-02 NOTE — Transfer of Care (Signed)
Immediate Anesthesia Transfer of Care Note  Patient: Hannah Powell  Procedure(s) Performed: Procedure(s): LEFT TOTAL SHOULDER ARTHROPLASTY (Left)  Patient Location: PACU  Anesthesia Type:General  Level of Consciousness: awake, alert  and oriented  Airway & Oxygen Therapy: Patient Spontanous Breathing and Patient connected to nasal cannula oxygen  Post-op Assessment: Report given to PACU RN and Post -op Vital signs reviewed and stable  Post vital signs: Reviewed and stable  Complications: No apparent anesthesia complications

## 2013-07-03 ENCOUNTER — Encounter (HOSPITAL_COMMUNITY): Payer: Self-pay | Admitting: *Deleted

## 2013-07-03 MED ORDER — DIAZEPAM 5 MG PO TABS: 2.5000 mg | ORAL_TABLET | Freq: Four times a day (QID) | ORAL | Status: DC | PRN

## 2013-07-03 MED ORDER — HYDROCODONE-ACETAMINOPHEN 7.5-325 MG PO TABS: 1.0000 | ORAL_TABLET | ORAL | Status: DC | PRN

## 2013-07-03 NOTE — Discharge Instructions (Signed)
° °  Kevin M. Supple, M.D., F.A.A.O.S. °Orthopaedic Surgery °Specializing in Arthroscopic and Reconstructive °Surgery of the Shoulder and Knee °[phone] °[address] - [place], [place] [address] - Fax [phone] ° ° °POST-OP TOTAL SHOULDER REPLACEMENT/SHOULDER HEMIARTHROPLASTY INSTRUCTIONS ° °1. Call the office at [phone] to schedule your first post-op appointment 10-14 days from the date of your surgery. ° °2. The bandage over your incision is waterproof. You may begin showering with this dressing on. You may leave this dressing on until first follow up appointment within 2 weeks. If you would like to remove it you may do so after the 5th day. Go slow and tug at the borders gently to break the bond the dressing has with the skin. The steri strips may come off with the dressing. At this point if there is no drainage it is okay to go without a bandage or you may cover it with a light guaze and tape. Leave the steri-strips in place over your incision. You can expect drainage that is bloody or yellow in nature that should gradually decrease from day of surgery. Change your dressing daily until drainage is completely resolved, then you may feel free to go without a bandage. You can also expect significant bruising around your shoulder that will drift down your arm and into your chest wall. This is very normal and should resolve over several days. ° ° 3. Wear your sling/immobilizer at all times except to perform the exercises below or to occasionally let your arm dangle by your side to stretch your elbow. You also need to sleep in your sling immobilizer until instructed otherwise. ° °4. Range of motion to your elbow, wrist, and hand are encouraged 3-5 times daily. Exercise to your hand and fingers helps to reduce swelling you may experience. ° °5. Utilize ice to the shoulder 3-5 times minimum a day and additionally if you are experiencing pain. ° °6. Prescriptions for a pain medication and a muscle  relaxant are provided for you. It is recommended that if you are experiencing pain that you pain medication alone is not controlling, add the muscle relaxant along with the pain medication which can give additional pain relief. The first 1-2 days is generally the most severe of your pain and then should gradually decrease. As your pain lessens it is recommended that you decrease your use of the pain medications to an "as needed basis'" only and to always comply with the recommended dosages of the pain medications. ° °7. Pain medications can produce constipation along with their use. If you experience this, the use of an over the counter stool softener or laxative daily is recommended.  ° ° ° °8. For additional questions or concerns, please do not hesitate to call the office. If after hours there is an answering service to forward your concerns to the physician on call. ° °POST-OP EXERCISES ° °Pendulum Exercises ° °Perform pendulum exercises while standing and bending at the waist. Support your uninvolved arm on a table or chair and allow your operated arm to hang freely. Make sure to do these exercises passively - not using you shoulder muscles. ° °Repeat 20 times. Do 3 sessions per day. ° ° ° ° °

## 2013-07-03 NOTE — Op Note (Signed)
NAMEJESSLYNN, KRUCK                 ACCOUNT NO.:  [id number]  MEDICAL RECORD NO.:  [id number]  LOCATION:  5N11C                        FACILITY:  [facility]  PHYSICIAN:  Metta Clines. Vaeda Westall, M.D.  DATE OF BIRTH:  April 12, 1945  DATE OF PROCEDURE:  07/02/2013 DATE OF DISCHARGE:                              OPERATIVE REPORT   PREOPERATIVE DIAGNOSIS:  End-stage osteoarthrosis of the left shoulder, with severe collapse of the humeral head related to avascular necrosis.  POSTOPERATIVE DIAGNOSIS:  End-stage osteoarthrosis of the left shoulder, with severe collapse of the humeral head related to avascular necrosis.  PROCEDURE:  Left total shoulder arthroplasty utilizing a press-fit size 14, DePuy global stem, a 48 x 18 eccentric head, and a cemented 44 glenoid.  SURGEON:  Metta Clines. Kasin Tonkinson, MD  ASSISTANT:  Reather Laurence. Shuford, PA-C  ANESTHESIA:  General endotracheal as well as an interscalene block.  ESTIMATED BLOOD LOSS:  250 mL.  SPECIMENS:  None.  DRAINS:  Hemovac x1.  HISTORY:  Ms. 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  Due to her persistent pain and functional limitations, she was brought to the operating room at this time for planned left total shoulder arthroplasty.  Preoperatively, I had counseled Ms. Zalenski regarding treatment options and risks versus benefits thereof.  Possible surgical complications were all reviewed including the potential for bleeding, infection, neurovascular injury, persistent pain, loss of motion, anesthetic complication, failure of the implant, possible need for  additional surgery.  She understands and accepts and agrees to planned procedure.  Additionally, on the preoperative studies, was found to have evidence for some loosening and migration of hardware in her neck from a previous anterior cervical diskectomy and fusion multiple levels.  This was performed by Dr. Vertell Limber a number of years ago.  I spoke with Dr. Vertell Limber yesterday.  He reviewed the radiographs and confirmed that these were chronic changes and did not feel that there is any reason to postpone the shoulder surgery and he would plan to follow up with Ms. Kemple once she had appropriately recovered from her shoulder surgery.  I counseled Ms. Morioka regarding these various issues and she was in agreement.  DESCRIPTION OF PROCEDURE:  After undergoing routine preop evaluation, the patient did receive prophylactic antibiotics.  An interscalene block was established in the holding area by the Anesthesia Department. Placed supine on the operating table, underwent smooth induction of a general endotracheal anesthesia.  Placed into beach-chair position and appropriately padded and protected.  The left shoulder girdle region was sterilely prepped and draped in standard fashion.  Time-out was called. An anterior approach to the left shoulder was made through a 12-cm deltopectoral incision.  Skin flaps were elevated.  Electrocautery was used for hemostasis.  The deltopectoral interval was then developed with the cephalic vein retracted laterally with the deltoid.  Upper cm half of the pectoralis major was tenotomized to  enhance exposure.  The adhesions were released beneath the deltoid.  Retractors were placed. Conjoined tendon was identified, mobilized, and retracted medially.  At this point, the biceps tendon was elevated from the bicipital groove and tenotomized for later tenodesis.  At the apex of the bicipital groove, I split the rotator cuff to the base of the coracoid and then divided  the subscapularis away from its insertion to the lesser tuberosity, leaving a 1-cm cuff of tissue for later repair.  The free margin was tagged with a series of #2 FiberWire sutures and then divided the capsular tissues from the anteroinferior inferior, inferior, and posterior inferior aspect of the humeral head, and maneuvering around very large osteophytes which had developed anteriorly and inferiorly.  The osteophytes were then removed with a rongeur.  An oscillating saw was then used to resect the humeral head based on the extramedullary guide and care was taken to protect the rotator cuff superiorly and posteriorly.  At this point, hand reaming of the canal was performed up to size 14.  The cookie cutter broach was then used to outline the stem position at the proximal 30 degrees retroversion, mimicking native retroversion.  Then, broached up to size 14 with good fit.  At this point, we released additional adhesions about the proximal humerus and then the metal cap was placed over the metaphyseal surface of the humerus and we then exposed the glenoid using a combination of Fukuda, pitchfork, and snake tongue retractors.  A very large osteophytes were noted around the margin of the glenoid which were all carefully excised with combination, blunt and sharp dissection, and electrocautery.  This allowed [place] to gain access to the circumference of the glenoid which was markedly inflamed with a layer of granulation tissue covering the surface.  I should also mention that the humeral head showed significant degeneration with collapse and was covered with granulation tissue as well, clearly indicating advanced degenerative changes and very inflammatory painful joint.  A guide pin was then placed in the center of the glenoid.  We reamed this with a 44-mm reamer to a stable subchondral bony bed.  The central drill hole was placed followed by the peripheral peg holes and the trial glenoid showed  excellent fit and fixation.  The glenoid was then irrigated, cleaned.  Cement was mixed and at the appropriate consistency, we introduced cement into the peripheral peg holes.  The final glenoid was impacted into position with excellent fit and fixation.  Cement was allowed to harden.  At this time, we returned our attention to the proximal humerus where the canal was irrigated.  Our size 14 stem was then impacted after we did use bone graft to fill the metaphyseal region and the humeral stem was then seated into the appropriate depth with excellent fit and fixation.  We then performed trial reductions and we found that 48 x 18 eccentric head had the best coverage and this final head was impacted after the [facility] taper was meticulously cleaned and dried.  Final reduction was performed after the joint was irrigated and soft tissue balance was much to our satisfaction with good stability.  At this point, the subscapularis was then repaired back to the lesser tuberosity.  The soft tissue repaired using #2 FiberWire and then repaired this rotator interval with a pair of figure-of-eight FiberWire sutures.  A FiberWire was used to perform a biceps tenodesis at the upper level of the pectoralis major and the proximal portion was then excised.  At  this point, we did place a Hemovac drain which we brought out posterolaterally.  Irrigation was completed.  Hemostasis was obtained.  The deltopectoral interval was then reapproximated with a series of figure-of-eight #1 Vicryl sutures, 2-0 Vicryl used for the subcu layer and intracuticular 3-0 Monocryl for the skin followed by Steri-Strips.  Dry dressings applied over left shoulder.  Left arm was placed in a sling.  The patient was awakened, extubated, and taken to the recovery room in stable condition.  Tracy A. Shuford, PA-C, was used as an Environmental consultant throughout this case as potential for help with position of the patient, position of the extremity,  retraction, tissue manipulation, wound closure, implantation of prosthesis, and intraoperative decision making.     Metta Clines. Omkar Stratmann, M.D.     KMS/MEDQ  D:  07/02/2013  T:  07/03/2013  Job:  [id number]

## 2013-07-03 NOTE — Evaluation (Signed)
Occupational Therapy Evaluation Patient Details Name: SHAWNTIA MANGAL MRN: [id number] DOB: 1945/04/26 Today's Date: 07/03/2013    History of Present Illness Pt s/p L TSA.   Clinical Impression   Pt s/p L TSA.  Education provided on shoulder precautions, ADL compensatory strategies and LUE HEP. Pt verbalized and demonstrated understanding of education.  Pt's husband present during session and verbalized understanding as well.  Pt typically ambulates with single point cane in left hand due to L LE deficits/weakness.  During OT session, she ambulated with cane in right hand. Although she does not require physical assist for balance, she does demonstrate left knee buckling and requires min guard for safety.  Pt states she has been receiving OP PT. OT recommends pt continue with OP PT for balance and to minimize fall risk, especially now that she is s/p L TSA.  No further acute OT needs. Recommend progress rehab of shoulder as recommended by MD. Pt anticipates d/c home later today.    Follow Up Recommendations  Supervision/Assistance - 24 hour (progress rehab of shoulder as recommended by MD )    Equipment Recommendations  None recommended by OT    Recommendations for Other Services       Precautions / Restrictions Precautions Precautions: Shoulder Type of Shoulder Precautions: Supple protocol for TSA Shoulder Interventions: Shoulder sling/immobilizer;For comfort Precaution Booklet Issued: Yes (comment) Precaution Comments: Educated pt and spouse on shoulder precautions, ADL compensatory strategies, and HEP.  Handout material provided. Required Braces or Orthoses: Sling Restrictions Weight Bearing Restrictions: Yes LUE Weight Bearing: Non weight bearing      Mobility Bed Mobility Overal bed mobility:  (not assessed- pt sitting EOB)                Transfers Overall transfer level: Needs assistance Equipment used: Straight cane Transfers: Sit to/from Stand Sit to Stand:  Supervision         General transfer comment: Supervision for safety due to unable to use LUE and for safety.    Balance                                            ADL Overall ADL's : Needs assistance/impaired Eating/Feeding: Set up;Sitting               Upper Body Dressing : Minimal assistance;Sitting;With caregiver independent assisting   Lower Body Dressing: Moderate assistance;Sit to/from stand   Toilet Transfer: Min guard;Ambulation;Comfort height toilet (single point cane)           Functional mobility during ADLs: Min guard;Cane General ADL Comments: Pt requires assist with UB/LB bathing/dressing but husband is independent in assisting.  Pt now ambulating with cane in right hand due to left UE precautions/immobilization.  Pt presents with left knee buckling at times when ambulating with cane but did not requires physical assist from OT for steadying/balance.  Educated pt on use of BSC as needed,especially for toileting needs at night. Also recommended remove any tripping hazards at home such as throw rugs.  Pt's husband reports he will be with her at all times and will get a w/c for her when they mobilize within community.  Pt states she has been receiving OP PT and plans to continue in order to improve balance.  OT agrees wtih this plan, especially since balance is now further affected by LUE in sling and use of cane on right  side. Pt able to verbalize and demonstrate understanding of all LUE exercises.      Vision                     Perception     Praxis      Pertinent Vitals/Pain C/o 5/10 pain in left shoulder after completing HEP.      Hand Dominance Right   Extremity/Trunk Assessment Upper Extremity Assessment Upper Extremity Assessment: LUE deficits/detail LUE Deficits / Details: Hand, wrist and elbow AROM WFL.  Shoulder PROM FF ~50 degrees, PROM ER 30 degrees, and PROM abduction ~40 degrees.   Lower Extremity  Assessment Lower Extremity Assessment: LLE deficits/detail LLE Deficits / Details: H/o left foot arthroplasty.       Communication Communication Communication: No difficulties   Cognition Arousal/Alertness: Awake/alert Behavior During Therapy: WFL for tasks assessed/performed Overall Cognitive Status: Within Functional Limits for tasks assessed                     General Comments       Exercises Exercises: Shoulder     Shoulder Instructions Shoulder Instructions Method for sponge bathing under operated UE: Minimal assistance Donning/doffing sling/immobilizer: Minimal assistance;Caregiver independent with task;Patient able to independently direct caregiver Correct positioning of sling/immobilizer: Independent Pendulum exercises (written home exercise program): Supervision/safety ROM for elbow, wrist and digits of operated UE: Independent Sling wearing schedule (on at all times/off for ADL's): Independent Proper positioning of operated UE when showering: [place] expects to be discharged to:: Private residence Living Arrangements: Spouse/significant other Available Help at Discharge: Family;Available 24 hours/day Type of Home: House Home Access: Stairs to enter [employer] of Steps: 2   Home Layout: One level     Bathroom Shower/Tub: Occupational psychologist: Handicapped height     Home Equipment: Environmental consultant - 2 wheels;Cane - single point;Shower seat - built in;Bedside commode          Prior Functioning/Environment Level of Independence: Independent with assistive device(s)        Comments: Pt uses single point cane in left hand for all functional mobility due to left foot pain (h/o sx on left foot).  Has been receiving OP PT per pt report.    OT Diagnosis:     OT Problem List:     OT Treatment/Interventions:      OT Goals(Current goals can be found in the care plan section)    OT Frequency:      Barriers to D/C:            Co-evaluation              End of Session Equipment Utilized During Treatment: Rolling walker (single point cane) Nurse Communication: Mobility status  Activity Tolerance: Patient tolerated treatment well Patient left: in chair;with call bell/phone within reach;with family/visitor present   Time: 1610-9604 OT Time Calculation (min): 35 min Charges:  OT General Charges $OT Visit: 1 Procedure OT Evaluation $Initial OT Evaluation Tier I: 1 Procedure OT Treatments $Self Care/Home Management : 8-22 mins $Therapeutic Exercise: 8-22 mins G-Codes:    Luther Bradley 2013-07-06, 9:42 AM 07-06-2013 Luther Bradley OTR/L Pager [phone] Office [phone]

## 2013-07-03 NOTE — Discharge Summary (Signed)
PATIENT ID:      Hannah Powell  MRN:     [id number] DOB/AGE:    Feb 14, 1946 / 68 y.o.     DISCHARGE SUMMARY  ADMISSION DATE:    07/02/2013 DISCHARGE DATE:    ADMISSION DIAGNOSIS: OA OF LEFT SHOULDER Past Medical History  Diagnosis Date  . CHF (congestive heart failure)   . Shortness of breath   . Hypertension     dr Otho Najjar     . Sleep apnea     cpap      >5 yrs  . Hypothyroidism   . Anxiety   . Arthritis   . RLS (restless legs syndrome)     DISCHARGE DIAGNOSIS:   Active Problems:   S/P shoulder replacement   PROCEDURE: Procedure(s): LEFT TOTAL SHOULDER ARTHROPLASTY on 07/02/2013  CONSULTS:   none  HISTORY:  See H&P in chart.  HOSPITAL COURSE:  Hannah Powell is a 68 y.o. admitted on 07/02/2013 with a chief complaint of left shoulder pain and dysfunction, and found to have a diagnosis of OA OF LEFT SHOULDER.  They were brought to the operating room on 07/02/2013 and underwent Procedure(s): LEFT TOTAL SHOULDER ARTHROPLASTY.    They were given perioperative antibiotics: Anti-infectives   Start     Dose/Rate Route Frequency Ordered Stop   07/02/13 1145  ceFAZolin (ANCEF) IVPB 1 g/50 mL premix     1 g 100 mL/hr over 30 Minutes Intravenous Every 6 hours 07/02/13 1124 07/03/13 0035   07/02/13 0600  ceFAZolin (ANCEF) 3 g in dextrose 5 % 50 mL IVPB     3 g 160 mL/hr over 30 Minutes Intravenous On call to O.R. 07/01/13 1428 07/02/13 0745    .  Patient underwent the above named procedure and tolerated it well. The following day they were hemodynamically stable and pain was controlled on oral analgesics. They were neurovascularly intact to the operative extremity. OT was ordered and worked with patient per protocol. They were medically and orthopaedically stable for discharge on day1 .   DIAGNOSTIC STUDIES:  RECENT RADIOGRAPHIC STUDIES :  Dg Chest 2 View  07/01/2013   CLINICAL DATA:  Pre-admission, shoulder surgery  EXAM: CHEST  2 VIEW  COMPARISON:  02/15/2006  FINDINGS:  Cardiomediastinal silhouette is unremarkable. Degenerative changes thoracic spine. No acute infiltrate or pleural effusion. No pulmonary edema. Degenerative changes are noted bilateral shoulders. A metallic fixation plate is noted in cervical spine. Please note the right most inferior screw of the metallic plate is dislodged. There is significant compression deformity mid thoracic spine of indeterminate age. Clinical correlation is necessary.  IMPRESSION: Degenerative changes thoracic spine. No acute infiltrate or pleural effusion. No pulmonary edema. Degenerative changes are noted bilateral shoulders. A metallic fixation plate is noted in cervical spine. Please note the right most inferior screw of the metallic plate is dislodged. There is significant compression deformity mid thoracic spine of indeterminate age. Clinical correlation is necessary.  These results were called by telephone at the time of interpretation on 07/01/2013 at 2:14 PM to Dr. Lennette Bihari SUPPLE , who verbally acknowledged these results.   Electronically Signed   By: Lahoma Crocker M.D.   On: 07/01/2013 14:14    RECENT VITAL SIGNS:  Patient Vitals for the past 24 hrs:  BP Temp Temp src Pulse Resp SpO2 Height Weight  07/03/13 0547 103/56 mmHg 97.8 F (36.6 C) Oral 75 18 97 % - -  07/03/13 0207 - - - - - - 5\' 5"  (1.651 m) 132.440  kg (272 lb)  07/02/13 2122 109/49 mmHg 98 F (36.7 C) Oral 88 18 99 % - -  07/02/13 1500 95/46 mmHg 98 F (36.7 C) Oral 89 18 100 % - -  07/02/13 1208 100/52 mmHg - - 83 - 93 % - -  07/02/13 1123 95/50 mmHg - - 80 16 93 % - -  07/02/13 1120 86/48 mmHg 98.1 F (36.7 C) Oral 81 16 90 % - -  07/02/13 1058 - 97.8 F (36.6 C) - 81 16 93 % - -  07/02/13 1049 96/48 mmHg - - - - - - -  07/02/13 1045 - - - 80 24 93 % - -  07/02/13 1032 97/54 mmHg - - - - - - -  07/02/13 1030 - - - 81 15 92 % - -  07/02/13 1017 94/73 mmHg - - - - - - -  07/02/13 1015 - - - 79 21 92 % - -  07/02/13 1003 97/53 mmHg - - - - - - -   07/02/13 1000 97/53 mmHg 97.8 F (36.6 C) - 82 16 88 % - -  .  RECENT EKG RESULTS:   No orders found for this or any previous visit.  DISCHARGE INSTRUCTIONS:    DISCHARGE MEDICATIONS:     Medication List         ALPRAZolam 0.5 MG tablet  Commonly known as:  XANAX  Take 0.5 mg by mouth daily as needed for anxiety.     aspirin EC 81 MG tablet  Take 81 mg by mouth daily.     B-complex with vitamin C tablet  Take 1 tablet by mouth daily.     BREO ELLIPTA 100-25 MCG/INH Aepb  Generic drug:  Fluticasone Furoate-Vilanterol  Inhale 1 puff into the lungs daily.     calcium-vitamin D 500-200 MG-UNIT per tablet  Take 1 tablet by mouth daily.     diazepam 5 MG tablet  Commonly known as:  VALIUM  Take 0.5-1 tablets (2.5-5 mg total) by mouth every 6 (six) hours as needed for muscle spasms or sedation.     furosemide 40 MG tablet  Commonly known as:  LASIX  Take 40 mg by mouth 2 (two) times daily as needed for fluid (Takes once or twice daily depending on fluid levels).     HYDROcodone-acetaminophen 7.5-325 MG per tablet  Commonly known as:  NORCO  Take 1-2 tablets by mouth every 4 (four) hours as needed for moderate pain.     levothyroxine 200 MCG tablet  Commonly known as:  SYNTHROID, LEVOTHROID  Take 200 mcg by mouth daily before breakfast.     losartan 25 MG tablet  Commonly known as:  COZAAR  Take 25 mg by mouth daily.     OMEGA 3 PO  Take 1 capsule by mouth daily.     potassium chloride SA 20 MEQ tablet  Commonly known as:  K-DUR,KLOR-CON  Take 20 mEq by mouth 2 (two) times daily as needed (for fluid. Takes once of twice daily depending on how many furosemide she takes.).     pramipexole 1 MG tablet  Commonly known as:  MIRAPEX  Take 3 mg by mouth at bedtime.        FOLLOW UP VISIT:       Follow-up Information   Follow up with SUPPLE,KEVIN M, MD. (call to be seen in 10-14 days)    Specialty:  Orthopedic Surgery   Contact information:   [facility]  [address] [place] [address] [phone]       DISCHARGE TO: Home  DISPOSITION: Good  DISCHARGE CONDITION:  Thereasa Parkin Sederick Jacobsen for Dr. Justice Britain 07/03/2013, 8:33 AM

## 2013-07-06 ENCOUNTER — Encounter (HOSPITAL_COMMUNITY): Payer: Self-pay | Admitting: Orthopedic Surgery

## 2013-12-10 ENCOUNTER — Ambulatory Visit: Payer: Self-pay | Admitting: Internal Medicine

## 2014-04-26 DIAGNOSIS — M6281 Muscle weakness (generalized): Secondary | ICD-10-CM | POA: Diagnosis not present

## 2014-05-11 ENCOUNTER — Ambulatory Visit: Payer: Self-pay | Admitting: Neurology

## 2014-05-11 DIAGNOSIS — M47812 Spondylosis without myelopathy or radiculopathy, cervical region: Secondary | ICD-10-CM | POA: Diagnosis not present

## 2014-05-11 DIAGNOSIS — M4802 Spinal stenosis, cervical region: Secondary | ICD-10-CM | POA: Diagnosis not present

## 2014-05-11 DIAGNOSIS — M5022 Other cervical disc displacement, mid-cervical region: Secondary | ICD-10-CM | POA: Diagnosis not present

## 2014-05-19 DIAGNOSIS — R609 Edema, unspecified: Secondary | ICD-10-CM | POA: Diagnosis not present

## 2014-05-19 DIAGNOSIS — R0602 Shortness of breath: Secondary | ICD-10-CM | POA: Diagnosis not present

## 2014-05-19 DIAGNOSIS — G4730 Sleep apnea, unspecified: Secondary | ICD-10-CM | POA: Diagnosis not present

## 2014-05-19 DIAGNOSIS — J449 Chronic obstructive pulmonary disease, unspecified: Secondary | ICD-10-CM | POA: Diagnosis not present

## 2014-06-18 DIAGNOSIS — I10 Essential (primary) hypertension: Secondary | ICD-10-CM | POA: Diagnosis not present

## 2014-06-18 DIAGNOSIS — M6281 Muscle weakness (generalized): Secondary | ICD-10-CM | POA: Diagnosis not present

## 2014-06-18 DIAGNOSIS — G4710 Hypersomnia, unspecified: Secondary | ICD-10-CM | POA: Diagnosis not present

## 2014-06-18 DIAGNOSIS — F329 Major depressive disorder, single episode, unspecified: Secondary | ICD-10-CM | POA: Diagnosis not present

## 2014-06-18 DIAGNOSIS — G629 Polyneuropathy, unspecified: Secondary | ICD-10-CM | POA: Diagnosis not present

## 2014-06-30 ENCOUNTER — Inpatient Hospital Stay
Admit: 2014-06-30 | Disposition: A | Discharge status: Home / Self Care | Payer: Self-pay | Attending: Internal Medicine | Admitting: Internal Medicine

## 2014-06-30 DIAGNOSIS — J9620 Acute and chronic respiratory failure, unspecified whether with hypoxia or hypercapnia: Secondary | ICD-10-CM | POA: Diagnosis not present

## 2014-06-30 DIAGNOSIS — R918 Other nonspecific abnormal finding of lung field: Secondary | ICD-10-CM | POA: Diagnosis not present

## 2014-06-30 DIAGNOSIS — F329 Major depressive disorder, single episode, unspecified: Secondary | ICD-10-CM | POA: Diagnosis not present

## 2014-06-30 DIAGNOSIS — I10 Essential (primary) hypertension: Secondary | ICD-10-CM | POA: Diagnosis not present

## 2014-06-30 DIAGNOSIS — J153 Pneumonia due to streptococcus, group B: Secondary | ICD-10-CM | POA: Diagnosis not present

## 2014-06-30 DIAGNOSIS — E039 Hypothyroidism, unspecified: Secondary | ICD-10-CM | POA: Diagnosis not present

## 2014-06-30 DIAGNOSIS — R0602 Shortness of breath: Secondary | ICD-10-CM | POA: Diagnosis not present

## 2014-06-30 DIAGNOSIS — J441 Chronic obstructive pulmonary disease with (acute) exacerbation: Secondary | ICD-10-CM | POA: Diagnosis not present

## 2014-06-30 DIAGNOSIS — E876 Hypokalemia: Secondary | ICD-10-CM | POA: Diagnosis not present

## 2014-06-30 DIAGNOSIS — F419 Anxiety disorder, unspecified: Secondary | ICD-10-CM | POA: Diagnosis not present

## 2014-06-30 DIAGNOSIS — G2581 Restless legs syndrome: Secondary | ICD-10-CM | POA: Diagnosis not present

## 2014-06-30 DIAGNOSIS — E785 Hyperlipidemia, unspecified: Secondary | ICD-10-CM | POA: Diagnosis not present

## 2014-06-30 DIAGNOSIS — J9600 Acute respiratory failure, unspecified whether with hypoxia or hypercapnia: Secondary | ICD-10-CM | POA: Diagnosis not present

## 2014-06-30 DIAGNOSIS — R05 Cough: Secondary | ICD-10-CM | POA: Diagnosis not present

## 2014-06-30 DIAGNOSIS — R0600 Dyspnea, unspecified: Secondary | ICD-10-CM | POA: Diagnosis not present

## 2014-06-30 DIAGNOSIS — J90 Pleural effusion, not elsewhere classified: Secondary | ICD-10-CM | POA: Diagnosis not present

## 2014-06-30 LAB — CBC WITH DIFFERENTIAL/PLATELET
Basophil #: 0 10*3/uL (ref 0.0–0.1)
Basophil %: 0.2 %
EOS PCT: 0.9 %
Eosinophil #: 0.1 10*3/uL (ref 0.0–0.7)
HCT: 41.2 % (ref 35.0–47.0)
HGB: 13.2 g/dL (ref 12.0–16.0)
LYMPHS PCT: 4.5 %
Lymphocyte #: 0.7 10*3/uL — ABNORMAL LOW (ref 1.0–3.6)
MCH: 30 pg (ref 26.0–34.0)
MCHC: 32.1 g/dL (ref 32.0–36.0)
MCV: 94 fL (ref 80–100)
MONOS PCT: 6.4 %
Monocyte #: 1 x10 3/mm — ABNORMAL HIGH (ref 0.2–0.9)
NEUTROS PCT: 88 %
Neutrophil #: 13.3 10*3/uL — ABNORMAL HIGH (ref 1.4–6.5)
PLATELETS: 217 10*3/uL (ref 150–440)
RBC: 4.41 10*6/uL (ref 3.80–5.20)
RDW: 14.8 % — ABNORMAL HIGH (ref 11.5–14.5)
WBC: 15 10*3/uL — AB (ref 3.6–11.0)

## 2014-06-30 LAB — COMPREHENSIVE METABOLIC PANEL
ALBUMIN: 3.8 g/dL
ALK PHOS: 121 U/L
ALT: 17 U/L
Anion Gap: 9 (ref 7–16)
BUN: 14 mg/dL
Bilirubin,Total: 0.8 mg/dL
CALCIUM: 8.4 mg/dL — AB
Chloride: 95 mmol/L — ABNORMAL LOW
Co2: 36 mmol/L — ABNORMAL HIGH
Creatinine: 0.74 mg/dL
EGFR (African American): >60
EGFR (Non-African Amer.): >60
GLUCOSE: 121 mg/dL — AB
POTASSIUM: 2.9 mmol/L — AB
SGOT(AST): 24 U/L
SODIUM: 140 mmol/L
TOTAL PROTEIN: 6.7 g/dL

## 2014-06-30 LAB — TROPONIN I: Troponin-I: <0.03 ng/mL

## 2014-06-30 LAB — TSH: Thyroid Stimulating Horm: 2.873 u[IU]/mL

## 2014-07-01 LAB — CBC WITH DIFFERENTIAL/PLATELET
Basophil #: 0 10*3/uL (ref 0.0–0.1)
Basophil %: 0.1 %
EOS PCT: 0 %
Eosinophil #: 0 10*3/uL (ref 0.0–0.7)
HCT: 43.5 % (ref 35.0–47.0)
HGB: 13.7 g/dL (ref 12.0–16.0)
Lymphocyte #: 0.5 10*3/uL — ABNORMAL LOW (ref 1.0–3.6)
Lymphocyte %: 2.2 %
MCH: 29.9 pg (ref 26.0–34.0)
MCHC: 31.6 g/dL — ABNORMAL LOW (ref 32.0–36.0)
MCV: 95 fL (ref 80–100)
Monocyte #: 0.5 x10 3/mm (ref 0.2–0.9)
Monocyte %: 2.5 %
Neutrophil #: 19.7 10*3/uL — ABNORMAL HIGH (ref 1.4–6.5)
Neutrophil %: 95.2 %
Platelet: 214 10*3/uL (ref 150–440)
RBC: 4.6 10*6/uL (ref 3.80–5.20)
RDW: 15.1 % — ABNORMAL HIGH (ref 11.5–14.5)
WBC: 20.7 10*3/uL — ABNORMAL HIGH (ref 3.6–11.0)

## 2014-07-01 LAB — URINALYSIS, COMPLETE
BACTERIA: NONE SEEN
Bilirubin,UR: NEGATIVE
Blood: NEGATIVE
Glucose,UR: NEGATIVE mg/dL (ref 0–75)
KETONE: NEGATIVE
LEUKOCYTE ESTERASE: NEGATIVE
Nitrite: NEGATIVE
PH: 6 (ref 4.5–8.0)
Protein: NEGATIVE
RBC,UR: NONE SEEN /HPF (ref 0–5)
SPECIFIC GRAVITY: 1.017 (ref 1.003–1.030)

## 2014-07-01 LAB — BASIC METABOLIC PANEL
ANION GAP: 6 — AB (ref 7–16)
BUN: 14 mg/dL
Calcium, Total: 8.4 mg/dL — ABNORMAL LOW
Chloride: 98 mmol/L — ABNORMAL LOW
Co2: 36 mmol/L — ABNORMAL HIGH
Creatinine: 0.64 mg/dL
Glucose: 178 mg/dL — ABNORMAL HIGH
POTASSIUM: 4 mmol/L
Sodium: 140 mmol/L

## 2014-07-02 LAB — POTASSIUM: Potassium: 4 mmol/L

## 2014-07-02 LAB — URINALYSIS, COMPLETE
BILIRUBIN, UR: NEGATIVE
Bacteria: NONE SEEN
Blood: NEGATIVE
Glucose,UR: NEGATIVE mg/dL (ref 0–75)
Ketone: NEGATIVE
Leukocyte Esterase: NEGATIVE
Nitrite: NEGATIVE
PH: 6 (ref 4.5–8.0)
Protein: NEGATIVE
Specific Gravity: 1.021 (ref 1.003–1.030)

## 2014-07-02 LAB — CBC WITH DIFFERENTIAL/PLATELET
Basophil #: 0 10*3/uL (ref 0.0–0.1)
Basophil %: 0.2 %
Eosinophil #: 0 10*3/uL (ref 0.0–0.7)
Eosinophil %: 0 %
HCT: 40.8 % (ref 35.0–47.0)
HGB: 13 g/dL (ref 12.0–16.0)
LYMPHS ABS: 0.3 10*3/uL — AB (ref 1.0–3.6)
LYMPHS PCT: 1.3 %
MCH: 30.2 pg (ref 26.0–34.0)
MCHC: 32 g/dL (ref 32.0–36.0)
MCV: 95 fL (ref 80–100)
Monocyte #: 0.8 x10 3/mm (ref 0.2–0.9)
Monocyte %: 3.5 %
NEUTROS ABS: 22.5 10*3/uL — AB (ref 1.4–6.5)
Neutrophil %: 95 %
PLATELETS: 245 10*3/uL (ref 150–440)
RBC: 4.31 10*6/uL (ref 3.80–5.20)
RDW: 14.7 % — ABNORMAL HIGH (ref 11.5–14.5)
WBC: 23.6 10*3/uL — ABNORMAL HIGH (ref 3.6–11.0)

## 2014-07-03 LAB — BASIC METABOLIC PANEL
Anion Gap: 3 — ABNORMAL LOW (ref 7–16)
BUN: 24 mg/dL — AB
CREATININE: 0.58 mg/dL
Calcium, Total: 8.8 mg/dL — ABNORMAL LOW
Chloride: 101 mmol/L
Co2: 37 mmol/L — ABNORMAL HIGH
EGFR (African American): >60
Glucose: 109 mg/dL — ABNORMAL HIGH
POTASSIUM: 3.9 mmol/L
SODIUM: 141 mmol/L

## 2014-07-03 LAB — WBC: WBC: 17.1 10*3/uL — ABNORMAL HIGH (ref 3.6–11.0)

## 2014-07-04 LAB — CBC WITH DIFFERENTIAL/PLATELET
EOS PCT: 1 %
HCT: 40.6 % (ref 35.0–47.0)
HGB: 13 g/dL (ref 12.0–16.0)
Lymphocytes: 19 %
MCH: 30.1 pg (ref 26.0–34.0)
MCHC: 32.1 g/dL (ref 32.0–36.0)
MCV: 94 fL (ref 80–100)
METAMYELOCYTE: 1 %
Monocytes: 11 %
PLATELETS: 229 10*3/uL (ref 150–440)
RBC: 4.32 10*6/uL (ref 3.80–5.20)
RDW: 14.9 % — ABNORMAL HIGH (ref 11.5–14.5)
Segmented Neutrophils: 68 %
WBC: 9.4 10*3/uL (ref 3.6–11.0)

## 2014-07-05 LAB — CULTURE, BLOOD (SINGLE)

## 2014-07-07 LAB — EXPECTORATED SPUTUM ASSESSMENT W REFEX TO RESP CULTURE

## 2014-07-09 NOTE — Consult Note (Signed)
PATIENT NAME:  Hannah Powell, Hannah Powell MR#:  [id number] DATE OF BIRTH:  [birth year]  DATE OF CONSULTATION:  01/02/2013  CONSULTING PHYSICIAN:  Isaias Cowman, MD  PRIMARY CARE PHYSICIAN:  Adin Hector, MD  CHIEF COMPLAINT: Shortness of breath.   HISTORY OF PRESENT ILLNESS: The patient is a 69 year old female with history of sleep apnea on CPAP and chronic diastolic congestive heart failure. The patient typically takes 40 mg of furosemide every morning and occasionally has difficulty with peripheral edema and shortness of breath, which generally is resolved after an extra furosemide. During the past 2 days, the patient reports worsening peripheral edema and shortness of breath resistant to outpatient furosemide. Saw Dr. Caryl Comes in the office and was admitted for intravenous furosemide and diuresis. The patient has responded to initial treatment with diuresis and clinical improvement.   PAST MEDICAL HISTORY: 1.  Sleep apnea, on CPAP.  2.  Hypertension.  3.  Hyperlipidemia.  4.  Obesity.  5.  Depression, anxiety.  6.  Hyperlipidemia.  7.  Restless leg syndrome.  8.  History of diastolic congestive heart failure.   MEDICATIONS: Hydrochlorothiazide 25 mg daily, furosemide 20 mg daily, potassium 10 mEq  daily, calcium 600 mg daily, fish oil cap 1000 mg daily, Requip 1 mg t.i.d. p.r.n., ibuprofen 800 mg daily p.r.n., Flexeril 10 mg at bedtime, Cymbalta 60 mg daily, levothyroxine 0.188 mg daily, Norco 5/325 q.6 p.r.n., alprazolam 0.5 mg t.i.d. p.r.n.   SOCIAL HISTORY: The patient has remote tobacco abuse.   FAMILY HISTORY: Father with a previous history of myocardial infarction, coronary artery disease.   REVIEW OF SYSTEMS:    CONSTITUTIONAL: No fever or chills.  EYES: No blurry vision.  EARS: No hearing loss.  RESPIRATORY: Shortness of breath as described above.  CARDIOVASCULAR: The patient does have peripheral edema.  GASTROINTESTINAL: No nausea, vomiting or diarrhea.  GENITOURINARY: No  dysuria or hematuria.  ENDOCRINE: No polyuria or polydipsia.  MUSCULOSKELETAL: No arthralgias or myalgias.  NEUROLOGICAL: No focal muscle weakness or numbness.  PSYCHOLOGICAL: No depression or anxiety.   PHYSICAL EXAMINATION: VITAL SIGNS: Blood pressure 137/74, pulse 82, respirations 18, temperature 98.6, pulse oximetry 96%.  HEENT: Pupils equal, reactive to light and accommodation.  NECK: Supple without thyromegaly.  LUNGS: Decreased breath sounds at both bases.  HEART: Normal JVP. Normal PMI. Regular rate and rhythm. Normal S1, S2. No appreciable gallop, murmur, rub.  ABDOMEN: Soft, nontender. Pulses were intact bilaterally.  MUSCULOSKELETAL: Normal muscle tone.  NEUROLOGIC: The patient is alert and oriented x 3. Motor and sensory both grossly intact.   IMPRESSION: A 69 year old female with known history of sleep apnea on CPAP with right-sided heart failure, diastolic congestive heart failure with worsening peripheral edema refractory to outpatient furosemide.   RECOMMENDATIONS: 1.  Agree with current therapy.  2.  Continue diuresis.  3.  Review 2-D echocardiogram.  4.  Had discussion with patient about the importance of low-sodium diet.  5.  Defer any further noninvasive or invasive cardiac evaluation at this time.   ____________________________ Isaias Cowman, MD ap:jm D: 01/02/2013 13:17:55 ET T: 01/02/2013 13:39:38 ET JOB#: [id number]  cc: Isaias Cowman, MD, <Dictator> Isaias Cowman MD ELECTRONICALLY SIGNED 02/04/2013 9:29

## 2014-07-09 NOTE — Discharge Summary (Signed)
PATIENT NAME:  Hannah Powell, Hannah Powell MR#:  [id number] DATE OF BIRTH:  March 11, 1946  DATE OF ADMISSION:  01/01/2013 DATE OF DISCHARGE:  01/05/2013  FINAL DIAGNOSES:  1. Acute on chronic respiratory failure due to acute left-sided diastolic congestive heart failure. 2. Chronic hypercapnic respiratory failure due to sleep apnea.  3. Degenerative joint disease.  4. Hypertension.  5. Hypothyroidism.  6. Degenerative disk disease.  7. Depression and anxiety.  8. Restless leg syndrome.  9. Chronic right-sided systolic congestive heart failure.   HISTORY AND PHYSICAL: Please see dictated admission history and physical.   [place]: The patient was admitted with acute respiratory failure, with profound hypoxia, which suggested an element of chronic respiratory failure in addition. She was initiated on diuresis, steroids were added for acute COPD exacerbation as well. She responded to diuretics slowly but steadily. Echocardiogram was performed, which revealed evidence of diastolic dysfunction, left-sided, and in addition the patient's known right-sided chronic CHF. She began to ambulate and felt improved and so was felt ready for discharge to home in stable condition. It was recommended that she weigh herself daily, calling for more than 2-pound gain in 1 day or 5 pounds in 1 week or increasing signs or symptoms of heart failure to include weight gain, fatigue, edema, dyspnea. She is familiar with these symptoms. She was placed on a 2 gram sodium diet. Her physical activity should be as tolerated with a walker. I anticipate her following up with [place] in the next 1 week. She should use CPAP whenever sleeping. She should wear compression hose whenever she is not in bed. Home health physical therapy and nursing was ordered for the patient as well to improve her ambulation and to monitor edema and her oxygenation.   DISCHARGE MEDICATIONS:  1. Calcium 600 mg p.o. daily.  2. Fish oil 1000 mg p.o.  daily. 3. Levothyroxine 0.188 mg p.o. daily.  4. Ambien 10 mg 1/2 to 1 tablet p.o. at bedtime as needed.  5. Norco 5/325 one p.o. q.6 hours p.r.n. severe pain.  6. Losartan 25 mg p.o. daily.  7. Duloxetine 60 mg p.o. daily.  8. Requip 1.5 mg 2 tablets p.o. daily.  9. Xanax 0.5 mg p.o. q.8 hours as needed for anxiety.  10. Ceftin 500 mg p.o. b.i.d. for 7 days.  11. Furosemide 40 mg p.o. b.i.d. as needed for edema.  12. Potassium 20 mEq p.o. b.i.d. as needed with furosemide.  13. Combivent MDI 1 puff 4 times a day.  14. Aspirin 81 mg p.o. daily.  15. Prednisone taper starting at 30 mg p.o. daily, decreasing by 10 mg every 2 days.   The patient was given instructions to stop hydrochlorothiazide.   ____________________________ Adin Hector, MD bjk:lb D: 01/16/2013 08:04:50 ET T: 01/16/2013 08:16:01 ET JOB#: [id number]  cc: Adin Hector, MD, <Dictator> Ramonita Lab MD ELECTRONICALLY SIGNED 01/27/2013 20:18

## 2014-07-09 NOTE — H&P (Signed)
PATIENT NAME:  Hannah Powell, PHARO MR#:  [id number] DATE OF BIRTH:  April 26, 1945  DATE OF ADMISSION:  01/01/2013  CHIEF COMPLAINT: Shortness of breath.   HISTORY OF PRESENT ILLNESS: A 69 year old female with history of sleep apnea, hypertension, hypothyroidism, degenerative disk disease who has had increasing lower extremity edema over the last several weeks.  She is not quite sure whether her weight has changed over this time she was unable to stand to be weighed today. She saw generally fatigued. She has been using Lasix and feels like it makes to make some urine but not a great amount. She presented to the office for evaluation after calling in with some these complaints, and initially was found to have oxygen saturation 67% on room air, 74% on repeat. This improved to 93% on 3 liters. Examination reveals decreased air flow into the bases, with extensive edema extending to her thighs. She is admitted now with acute respiratory failure, appears to be acute exacerbation of congestive heart failure.   PAST MEDICAL HISTORY: 1.  Sleep apnea on CPAP 10.  2.  Hypertension.  3.  Hypothyroidism.  4.  Depression and anxiety.  5.  Restless leg syndrome.  6.  Hyperlipidemia.  7.  Degenerative disk disease with C3-C7 decompression.  8.  History of chronic right-sided systolic congestive heart failure by prior echocardiogram.  9.  History of bilateral knee replacements, last September 2012 and on the right side.  10.  History of talar effusion.  11.  History of bilateral tubal ligation.   ALLERGIES: LYRICA.   MEDICATIONS: 1.  Calcium 600 mg p.o. daily.  2.  Fish oil 1000 mg p.o. daily.  3.  HCTZ 25 mg p.o. daily; ferrous sulfate 325 mg p.o. daily.  4.  Lasix 20 mg p.o. daily.  5.  Requip 1 mg p.o. t.i.d. p.r.n.  6.  Ibuprofen 800 mg p.o. daily as needed for pain.  7.  Flexeril 10 mg one half to 1 tablet p.o. at bedtime as needed for muscle pain.  8.  Cymbalta 60 mg p.o. daily.  9.  Levothyroxine, total  dose 0.188 mg p.o. daily.  10.  Norco 5/325 mg, 1 p.o. q. 6 hours as needed for pain.  11.  Alprazolam 0.5 mg p.o. 3 times daily p.r.n. anxiety.  12.  Potassium 10 mEq p.o. daily p.r.n. with Lasix.   SOCIAL HISTORY: Remote tobacco. Occasional alcohol.   FAMILY HISTORY: Hypertension, diabetes, Parkinson's, coronary artery disease.   REVIEW OF SYSTEMS: Please see history of present illness. No dysphagia; no real chest pain; no fevers, chills. The remainder of complete review of systems is negative.   PHYSICAL EXAMINATION: VITAL SIGNS: Blood pressure 130/80, temperature 98.2, pulse 108, respirations 18, saturation initially 67% on room air.  GENERAL: Obese female, appears fatigued.  EYES: Pupils are round and reactive to light. Lids and conjunctivae unremarkable.  EARS, NOSE, AND THROAT:  Throat examination unremarkable. The oropharynx is moist without lesions.  NECK: Supple. Trachea in the midline. No thyromegaly.  CARDIOVASCULAR: Regular rate and rhythm without gallops or rubs. Carotid and radial pulses 2+.  LUNGS: Decreased air flow into the bases without retractions.  ABDOMEN: Soft and nontender. Positive bowel sounds. No guarding, rebound, mass.  SKIN: No significant rashes or nodules.  LYMPH NODES: No cervical or supraclavicular nodes.  MUSCULOSKELETAL:  No clubbing or cyanosis. 2+ edema extending above the knees bilaterally. Venous stasis changes. Bilateral knee replacement changes. Negative Homans.  NEUROLOGICAL: Cranial nerves intact. Generalized weakness but motor strength appears  to be grossly symmetrical.   IMPRESSION AND PLAN: 1.  Acute respiratory failure. It appears most likely with pulmonary edema, possibly acute on chronic right-sided systolic congestive heart failure, but given edema, with prior knee surgery, need to evaluate for potential emboli. We will get chest x-ray, echo, EKG and follow cardiac enzymes.  Empiric antibiotics and we will add some steroids as there is some  decreased air flow, though this may be more "cardiac asthma" if there is some bronchospasm present. Lovenox deep vein thrombosis prophylaxis for now and consideration for increasing based on finding on D-dimer and further investigation of the above. Aggressive diuresis as tolerated.  2.  Sleep apnea. Continue CPAP.  3.  Hypothyroidism. Continue levothyroxine recheck TSH.  4.  Depression/anxiety. Continue home medications.  5.  Degenerative disk disease and chronic pain syndrome. Continue pain medication as needed.   ____________________________ Adin Hector, MD bjk:cc D: 01/01/2013 17:36:44 ET T: 01/01/2013 18:01:52 ET JOB#: [id number]  cc: Adin Hector, MD, <Dictator> Ramonita Lab MD ELECTRONICALLY SIGNED 01/09/2013 12:37

## 2014-07-15 DIAGNOSIS — M4806 Spinal stenosis, lumbar region: Secondary | ICD-10-CM | POA: Diagnosis not present

## 2014-07-15 DIAGNOSIS — E780 Pure hypercholesterolemia: Secondary | ICD-10-CM | POA: Diagnosis not present

## 2014-07-15 DIAGNOSIS — J439 Emphysema, unspecified: Secondary | ICD-10-CM | POA: Diagnosis not present

## 2014-07-15 DIAGNOSIS — G4730 Sleep apnea, unspecified: Secondary | ICD-10-CM | POA: Diagnosis not present

## 2014-07-18 NOTE — H&P (Signed)
PATIENT NAME:  Hannah Powell, BOEHNING MR#:  [id number] DATE OF BIRTH:  April 02, 1945  DATE OF ADMISSION:  06/30/2014  CHIEF COMPLAINT: Shortness of breath.   HISTORY OF PRESENT ILLNESS: A 69 year old female with history of hypertension, sleep apnea, hypothyroidism, COPD, arthritis with lumbar spinal stenosis. She developed upper respiratory symptoms yesterday with rapid progression overnight, now with significant wheezing, cough. Presented to the office for a routine evaluation, with oxygen saturation found to be 83-84% at that time. She was able to cough up some sputum and briefly bring her saturations up before they would then fall. She is admitted now with acute COPD exacerbation with acute respiratory failure.   PAST MEDICAL HISTORY:  1. Hypertension.  2. Sleep apnea.  3. Hypothyroidism.  4. Restless leg syndrome.  5. Depression and anxiety.  6. Hyperlipidemia.  7. Osteoarthritis.  8. Lumbar spinal stenosis with bilateral leg weakness.  9. COPD.   10. History of polyneuropathy.   ALLERGIES: Lyrica.   MEDICATIONS:  1. Xanax 0.5 mg p.o. b.i.d. p.r.n. anxiety.  2. Aspirin 81 mg p.o. daily.  3. Calcium 600 mg p.o. daily.  4. Breo 1 puff b.i.d.  5. Lasix 80 mg p.o. daily as needed for edema.  6. Tussionex 5 mL p.o. b.i.d. p.r.n. cough.  7. Combivent MDI 1 puff t.i.d.  8. Levothyroxine 0.2 mg p.o. daily.  9. Losartan 25 mg p.o. daily.  10. Potassium 20 mEq p.o. daily p.r.n. with Lasix.  11. Mirapex 1 mg p.o. at bedtime.  12. Zolpidem 5 mg p.o. at bedtime.   SOCIAL HISTORY: No active tobacco or significant alcohol use.   FAMILY HISTORY: Noncontributory.   REVIEW OF SYSTEMS: Please see HPI. The remainder of complete review of systems is negative.   PHYSICAL EXAMINATION:  VITAL SIGNS: Blood pressure 140/70, pulse 93, temperature 99.1, respirations 18, saturation 83% on room air. Weight is 282 pounds.  GENERAL: Obese female, appears ill.  EYES: Pupils round and react to light. Lids and  conjunctivae unremarkable.  EARS, NOSE AND THROAT: Examination unremarkable. The oropharynx is slightly dry without lesions.  NECK: Supple, trachea in the midline. No thyromegaly.  CARDIOVASCULAR: Regular rate and rhythm without gallops, rubs. Carotid and radial pulses 2 +.  LUNGS: Poor air flow throughout with expiratory wheeze. Occasional retractions.  ABDOMEN: Soft, nontender, nondistended. Positive bowel sounds.  SKIN: No significant rashes or nodules.  LYMPH NODES: No cervical or supraclavicular nodes.  MUSCULOSKELETAL: No clubbing, cyanosis. Postoperative changes in the knees. 1 + ankle edema with lymphedema present.   NEUROLOGIC: Cranial nerves intact with generalized weakness secondary to illness.   IMPRESSION AND PLAN:  1.  Acute chronic obstructive pulmonary disease exacerbation/acute on chronic respiratory failure. Start on steroids, add enteric antibiotics, convert to Symbicort and place on DuoNeb SVNs. Chest x-ray today, EKG, and cycle cardiac enzymes. Further investigation to be based on the above. Send sputum for culture if able.  2.  Hypertension. Continue losartan at home dose for now. Monitor renal function. 3.  Chronic edema. Hold Lasix and potassium for now and see how does with the above.  4.  Hypothyroidism. Check a TSH. Continue home dose levothyroxine.  5.  Restless leg syndrome. Continue on Mirapex.  6.  Anxiety. Continue Xanax as needed.    ____________________________ Adin Hector, MD bjk:bu D: 06/30/2014 15:23:40 ET T: 06/30/2014 15:56:40 ET JOB#: [id number]  cc: Adin Hector, MD, <Dictator> Ramonita Lab MD ELECTRONICALLY SIGNED 07/01/2014 8:21

## 2014-07-18 NOTE — Discharge Summary (Signed)
PATIENT NAME:  Hannah Powell, KRONICK MR#:  [id number] DATE OF BIRTH:  [birth year]  DATE OF ADMISSION:  06/30/2014 DATE OF DISCHARGE:  07/05/2014  FINAL DIAGNOSES:  1.  Pneumonia, probably from group B strep.  2.  Acute on chronic respiratory failure.  3.  Acute chronic obstructive pulmonary disease exacerbation.  4.  Hypertension.  5.  Chronic edema.  6.  Hypothyroidism.  7.  Restless leg syndrome depression.  8.  Anxiety.  9.  Degenerative joint disease.   HISTORY AND PHYSICAL: Please see dictated admission history and physical.   HOSPITAL COURSE: The patient was admitted with acute respiratory failure and findings of acute COPD exacerbation. Chest x-ray revealed bilateral increased interstitial markings which appeared to be infectious. She was placed on broad-spectrum antibiotics, steroids, inhaled medications, with improvement. Sputum culture was sent, which grew group B strep. She was converted over to oral antibiotics and oral steroids, which she tolerated. She was weaned down on oxygen and saturations were 92 to 93% on room air. She did drop to approximately 86% briefly with ambulation, before coming back up; however, she was asymptomatic with this and did not wish to use oxygen at home, although she is aware that recommendation that she do so. We had discussed home health physical therapy, but she thinks that she would do better if she can go to place where there is equipment, and so she will be set up for outpatient physical therapy. It was recommended that she weigh herself daily, calling for more than 2 pound gain in 1 day or 5 pounds in 1 week or any increased signs or symptoms of heart failure. She should be up as tolerated with a walker. Her diet should be 2 gm sodium, low fat, low cholesterol. She will follow up in our office in 1 to 2 weeks.   DISCHARGE MEDICATIONS:  1.  Ambien 5 mg p.o. at bedtime as needed for sleep.  2.  Norco 5/325, 1 p.o. every 6 hours as needed for pain.  3.   Losartan 25 mg p.o. daily. 4.  Mirapex 1.5 mg 2 tablets p.o. at bedtime.  5.  Xanax 0.5 mg p.o. every 8 hours as needed for anxiety.  6.  Combivent metered dose inhaler 1 puff be 4 times a day.  7.  Aspirin 81 mg p.o. daily.  8.  Levothyroxine 0.2 mg p.o. daily.  9.  Lasix 80 mg p.o. b.i.d.  10.  Potassium 20 mEq p.o. daily.  11.  Prednisone taper starting at 50 mg p.o. daily, decreasing x 10 mg every 2 days.  12.  Levofloxacin 500 mg p.o. daily x 5 days to complete a course.  13.  Tussionex 5 mL p.o. b.i.d. p.r.n. cough.  14.  Breo 1 inhalation twice a day to.   The patient will stop calcium while she is on Levaquin. She may hold fish oil or restart this if she wishes.   ____________________________ Adin Hector, MD bjk:sp D: 07/05/2014 12:46:31 ET T: 07/05/2014 16:44:47 ET JOB#: [id number]  cc: Adin Hector, MD, <Dictator> Ramonita Lab MD ELECTRONICALLY SIGNED 07/13/2014 8:01

## 2014-09-02 DIAGNOSIS — M6281 Muscle weakness (generalized): Secondary | ICD-10-CM | POA: Diagnosis not present

## 2014-09-02 DIAGNOSIS — R2689 Other abnormalities of gait and mobility: Secondary | ICD-10-CM | POA: Diagnosis not present

## 2014-09-08 ENCOUNTER — Ambulatory Visit: Payer: Commercial Managed Care - HMO | Attending: Neurology | Admitting: Physical Therapy

## 2014-09-08 DIAGNOSIS — M6281 Muscle weakness (generalized): Secondary | ICD-10-CM | POA: Insufficient documentation

## 2014-09-08 DIAGNOSIS — Z9181 History of falling: Secondary | ICD-10-CM

## 2014-09-08 DIAGNOSIS — R2681 Unsteadiness on feet: Secondary | ICD-10-CM | POA: Diagnosis not present

## 2014-09-08 DIAGNOSIS — R2689 Other abnormalities of gait and mobility: Secondary | ICD-10-CM

## 2014-09-08 NOTE — Patient Instructions (Signed)
All exercises provided were adapted from [email or web address]. Patient was provided a written handout with pictures as described. Any additional cues were manually entered in to handout and copied in to this document.    Standing Marching ( 10 times, 2 second hold, 2 sets, 1x per day)   Stand with upright posture and abdominals contracted, slowly bend and straigthen hip as if you were marching. Repeat as directed.    TANDEM STANCE WITH SUPPORT (5 times, 1set, 1x per day)  Stand in front of a chair, table or counter top for support. Then place the heel of one foot so that it is touching the toes of the other foot. Maintain your balance in this position.    SIT TO STAND - NO HANDS (3 times, 1 second hold, 4 sets, 1x per day)  Start by sitting in a chair. Next, raise up to standing without using your hands for support.

## 2014-09-08 NOTE — Therapy (Signed)
[place] [facility] SERVICES [address] [place], [name], [address] Phone: [phone]   Fax:  [phone]  Physical Therapy Evaluation  Patient Details  Name: Hannah Powell MRN: [id number] Date of Birth: 1945/06/18 Referring Provider:  Vladimir Crofts, MD  Encounter Date: 09/08/2014      PT End of Session - 09/08/14 1501    Visit Number 1   Number of Visits 17   Date for PT Re-Evaluation 11/17/14   Authorization Type 1/10   PT Start Time 1350   PT Stop Time 1457   PT Time Calculation (min) 67 min   Equipment Utilized During Treatment Gait belt  SPC   Activity Tolerance Patient tolerated treatment well;Patient limited by fatigue   Behavior During Therapy [facility] for tasks assessed/performed      Past Medical History  Diagnosis Date  . CHF (congestive heart failure)   . Shortness of breath   . Hypertension     dr Otho Najjar     . Sleep apnea     cpap      >5 yrs  . Hypothyroidism   . Anxiety   . Arthritis   . RLS (restless legs syndrome)     Past Surgical History  Procedure Laterality Date  . Back surgery      cervical  . Joint replacement      x2 tkr  . Foot arthroplasty    . Total shoulder arthroplasty Left 07/02/2013    Procedure: LEFT TOTAL SHOULDER ARTHROPLASTY;  Surgeon: Marin Shutter, MD;  Location: [facility];  Service: Orthopedics;  Laterality: Left;    There were no vitals filed for this visit.  Visit Diagnosis:  Generalized muscle weakness - Plan: PT plan of care cert/re-cert  Imbalance - Plan: PT plan of care cert/re-cert  At risk for falls - Plan: PT plan of care cert/re-cert      Subjective Assessment - 09/08/14 1355    Subjective Patient reports she had pain in the lateral part of her left foot/ankle roughly 8 months prior to a surgery to "lift" the arch in her foot. She reports attempting PT and other services, which were unsuccessful. Since that time she has found an increased reliance on an  assistive device for a combination of lack of confidence and to increase her safety.    Limitations Walking   Patient Stated Goals Improve her balance to the point where she no longer uses her cane.    Currently in Pain? No/denies            [facility] PT Assessment - 09/08/14 0001    Assessment   Medical Diagnosis Balance deficit   Precautions   Precautions Fall   Restrictions   Weight Bearing Restrictions No   Balance Screen   Has the patient fallen in the past 6 months No   Has the patient had a decrease in activity level because of a fear of falling?  Yes   Is the patient reluctant to leave their home because of a fear of falling?  No   Sit to Stand   Comments Unable to complete with arm rails/cane   Strength   Overall Strength Within functional limits for tasks performed   Straight Leg Raise   Findings Negative   Hip Scouring   Findings Negative   6 Minute Walk- Baseline   6 Minute Walk- Baseline yes   6 minute walk test results    Aerobic Endurance Distance Walked 385  Standardized Balance Assessment   Five times sit to stand comments  13 seconds  Had to use arm rails, unable to stand without arm rails   10 Meter Walk 16.18  With cane   Timed Up and Go Test   Normal TUG (seconds) 21.5   TUG Comments Used Cane   Functional Gait  Assessment   Gait Level Surface Walks 20 ft in less than 7 sec but greater than 5.5 sec, uses assistive device, slower speed, mild gait deviations, or deviates 6-10 in outside of the 12 in walkway width.   Change in Gait Speed Makes only minor adjustments to walking speed, or accomplishes a change in speed with significant gait deviations, deviates 10-15 in outside the 12 in walkway width, or changes speed but loses balance but is able to recover and continue walking.   Gait with Horizontal Head Turns Performs head turns smoothly with slight change in gait velocity (eg, minor disruption to smooth gait path), deviates 6-10 in outside 12 in walkway  width, or uses an assistive device.   Gait with Vertical Head Turns Performs head turns with no change in gait. Deviates no more than 6 in outside 12 in walkway width.   Gait and Pivot Turn Pivot turns safely within 3 sec and stops quickly with no loss of balance.   Step Over Obstacle Is able to step over one shoe box (4.5 in total height) but must slow down and adjust steps to clear box safely. May require verbal cueing.   Gait with Narrow Base of Support Ambulates less than 4 steps heel to toe or cannot perform without assistance.   Gait with Eyes Closed Cannot walk 20 ft without assistance, severe gait deviations or imbalance, deviates greater than 15 in outside 12 in walkway width or will not attempt task.   Ambulating Backwards Walks 20 ft, slow speed, abnormal gait pattern, evidence for imbalance, deviates 10-15 in outside 12 in walkway width.   Steps Two feet to a stair, must use rail.   Total Score 14     There-Ex  Sit to stand with two pillows underneath, RUE using SPC, and LUE pushing off through this with  Cuing to move as fast as she can. 3 sets x 4 repetitions  Standing hip flexion marhcing x 10 bilaterally with unilateral UE support for 2 sets  Tandem stance x 4-10 seconds bilaterally for 5 sets total without hand hold assistance.                       PT Education - 09/08/14 1500    Education provided Yes   Education Details Patient educated on progression of PT, need to exercise daily (both her HEP and ambulation), and need for use of a SPC currently.    Person(s) Educated Patient   Methods Explanation;Demonstration;Handout   Comprehension Verbalized understanding;Returned demonstration             PT Long Term Goals - 09/08/14 1518    PT LONG TERM GOAL #1   Title Patient will be independent with home exercise program to increase her strength, power, and endurance with mobility by 11/17/2014.   Status New   PT LONG TERM GOAL #2   Title Patient will  increase her 6 minute walk distance by at least 150 feet to display increased endurance and gait speed to return to community mobility by 11/17/2014.    Baseline 385 feet    Status New   PT LONG  TERM GOAL #3   Title Patient will decrease 92m walk time by at least 2 seconds with minimally assistive device to return to community mobility speed by 11/17/2014.    Baseline 16.18 seconds    Status New   PT LONG TERM GOAL #4   Title Patient will complete a TUG in less than 14 seconds with minimally assistive device to display increased safety and decreased falls risk with dynamic mobility by 11/17/2014.    Baseline 21.5 seconds    Status New   PT LONG TERM GOAL #5   Title Patient will complete 5x sit to stand in less than 15 seconds without use of hands to display increased strength and independence with mobility by 11/17/2014.    Baseline Unable to stand without hand hold assistance    Status New   Additional Long Term Goals   Additional Long Term Goals Yes   PT LONG TERM GOAL #6   Title Patient will increase her score on the FGA by at least 4 points to display increase in safety and independence with mobility by 11/17/2014.   Baseline 14   Status New               Plan - 10-08-2014 1513    Clinical Impression Statement Patient is a 69 y/o female that presents with marked deconditioning, functional weakness, and impaired balance. She presents with decreased 6 minute walk time (385 feet), TUG (21.5 seconds), and is unable to transfer sit <--> stand without using her UEs. She is limited currently by fatigue but not pain. She is very motivated to complete her exercise sessions and not have to use her SPC. Skilled PT services are indicated to address her safety and independence with mobility.    Pt will benefit from skilled therapeutic intervention in order to improve on the following deficits Abnormal gait;Difficulty walking;Decreased activity tolerance;Cardiopulmonary status limiting  activity;Decreased balance;Decreased endurance;Pain;Obesity;Decreased range of motion;Decreased mobility   Rehab Potential Good   Clinical Impairments Affecting Rehab Potential Positive: Motivation Negative: deconditioned status, obese.   PT Frequency 2x / week   PT Duration 8 weeks   PT Treatment/Interventions ADLs/Self Care Home Management;DME Instruction;Gait training;Stair training;Cryotherapy;Therapeutic activities;Therapeutic exercise;Neuromuscular re-education;Balance training;Energy conservation   PT Next Visit Plan Progress balance and strength training (for LEs). Increase hip extensor strengthening (bridging, standing hip abductions, step ups, single leg stance).    PT Home Exercise Plan See patient instructions.    Consulted and Agree with Plan of Care Patient          G-Codes - 08-Oct-2014 1503    Functional Assessment Tool Used 6 Minute walk test, 5x sit to stand, TUG, 40m walk time, FGA   Functional Limitation Mobility: Walking and moving around   Mobility: Walking and Moving Around Current Status [phone]) At least 40 percent but less than 60 percent impaired, limited or restricted   Mobility: Walking and Moving Around Goal Status [phone]) At least 20 percent but less than 40 percent impaired, limited or restricted       Problem List Patient Active Problem List   Diagnosis Date Noted  . S/P shoulder replacement 07/02/2013   Kerman Passey, PT, DPT   10-08-2014, 4:22 PM  [place] [facility] SERVICES [address] [place], [name], [address] Phone: [phone]   Fax:  [phone]

## 2014-09-24 DIAGNOSIS — M179 Osteoarthritis of knee, unspecified: Secondary | ICD-10-CM | POA: Diagnosis not present

## 2014-09-24 DIAGNOSIS — M25562 Pain in left knee: Secondary | ICD-10-CM | POA: Diagnosis not present

## 2014-09-24 DIAGNOSIS — S82035A Nondisplaced transverse fracture of left patella, initial encounter for closed fracture: Secondary | ICD-10-CM | POA: Diagnosis not present

## 2014-09-27 ENCOUNTER — Ambulatory Visit: Payer: Commercial Managed Care - HMO | Admitting: Physical Therapy

## 2014-09-29 ENCOUNTER — Ambulatory Visit: Payer: Commercial Managed Care - HMO | Admitting: Physical Therapy

## 2014-10-04 ENCOUNTER — Encounter: Payer: Commercial Managed Care - HMO | Admitting: Physical Therapy

## 2014-10-06 ENCOUNTER — Encounter: Payer: Commercial Managed Care - HMO | Admitting: Physical Therapy

## 2014-10-06 DIAGNOSIS — I10 Essential (primary) hypertension: Secondary | ICD-10-CM | POA: Diagnosis not present

## 2014-10-06 DIAGNOSIS — E780 Pure hypercholesterolemia: Secondary | ICD-10-CM | POA: Diagnosis not present

## 2014-10-11 ENCOUNTER — Encounter: Payer: Commercial Managed Care - HMO | Admitting: Physical Therapy

## 2014-10-13 ENCOUNTER — Encounter: Payer: Commercial Managed Care - HMO | Admitting: Physical Therapy

## 2014-10-14 DIAGNOSIS — S82032D Displaced transverse fracture of left patella, subsequent encounter for closed fracture with routine healing: Secondary | ICD-10-CM | POA: Diagnosis not present

## 2014-10-14 DIAGNOSIS — S82092D Other fracture of left patella, subsequent encounter for closed fracture with routine healing: Secondary | ICD-10-CM | POA: Diagnosis not present

## 2014-10-19 ENCOUNTER — Encounter: Payer: Commercial Managed Care - HMO | Admitting: Physical Therapy

## 2014-10-19 DIAGNOSIS — G4730 Sleep apnea, unspecified: Secondary | ICD-10-CM | POA: Diagnosis not present

## 2014-10-19 DIAGNOSIS — I10 Essential (primary) hypertension: Secondary | ICD-10-CM | POA: Diagnosis not present

## 2014-10-19 DIAGNOSIS — E780 Pure hypercholesterolemia: Secondary | ICD-10-CM | POA: Diagnosis not present

## 2014-10-19 DIAGNOSIS — E038 Other specified hypothyroidism: Secondary | ICD-10-CM | POA: Diagnosis not present

## 2014-10-21 ENCOUNTER — Encounter: Payer: Commercial Managed Care - HMO | Admitting: Physical Therapy

## 2014-10-25 ENCOUNTER — Encounter: Payer: Commercial Managed Care - HMO | Admitting: Physical Therapy

## 2014-10-27 ENCOUNTER — Encounter: Payer: Commercial Managed Care - HMO | Admitting: Physical Therapy

## 2014-11-01 ENCOUNTER — Encounter: Payer: Commercial Managed Care - HMO | Admitting: Physical Therapy

## 2014-11-02 DIAGNOSIS — S82032D Displaced transverse fracture of left patella, subsequent encounter for closed fracture with routine healing: Secondary | ICD-10-CM | POA: Diagnosis not present

## 2014-11-02 DIAGNOSIS — S82002D Unspecified fracture of left patella, subsequent encounter for closed fracture with routine healing: Secondary | ICD-10-CM | POA: Diagnosis not present

## 2014-11-03 ENCOUNTER — Encounter: Payer: Commercial Managed Care - HMO | Admitting: Physical Therapy

## 2014-11-05 DIAGNOSIS — N390 Urinary tract infection, site not specified: Secondary | ICD-10-CM | POA: Diagnosis not present

## 2014-11-08 ENCOUNTER — Encounter: Payer: Commercial Managed Care - HMO | Admitting: Physical Therapy

## 2014-11-10 ENCOUNTER — Encounter: Payer: Commercial Managed Care - HMO | Admitting: Physical Therapy

## 2014-11-16 DIAGNOSIS — S82002G Unspecified fracture of left patella, subsequent encounter for closed fracture with delayed healing: Secondary | ICD-10-CM | POA: Diagnosis not present

## 2014-11-30 DIAGNOSIS — S82002D Unspecified fracture of left patella, subsequent encounter for closed fracture with routine healing: Secondary | ICD-10-CM | POA: Diagnosis not present

## 2014-11-30 DIAGNOSIS — S82032D Displaced transverse fracture of left patella, subsequent encounter for closed fracture with routine healing: Secondary | ICD-10-CM | POA: Diagnosis not present

## 2014-12-21 DIAGNOSIS — R590 Localized enlarged lymph nodes: Secondary | ICD-10-CM | POA: Diagnosis not present

## 2014-12-21 DIAGNOSIS — S82002D Unspecified fracture of left patella, subsequent encounter for closed fracture with routine healing: Secondary | ICD-10-CM | POA: Diagnosis not present

## 2015-01-13 DIAGNOSIS — G4733 Obstructive sleep apnea (adult) (pediatric): Secondary | ICD-10-CM | POA: Diagnosis not present

## 2015-01-13 DIAGNOSIS — I10 Essential (primary) hypertension: Secondary | ICD-10-CM | POA: Diagnosis not present

## 2015-01-13 DIAGNOSIS — G2581 Restless legs syndrome: Secondary | ICD-10-CM | POA: Diagnosis not present

## 2015-01-20 DIAGNOSIS — S82002D Unspecified fracture of left patella, subsequent encounter for closed fracture with routine healing: Secondary | ICD-10-CM | POA: Diagnosis not present

## 2015-02-23 DIAGNOSIS — Z6841 Body Mass Index (BMI) 40.0 and over, adult: Secondary | ICD-10-CM | POA: Diagnosis not present

## 2015-02-23 DIAGNOSIS — M79672 Pain in left foot: Secondary | ICD-10-CM | POA: Diagnosis not present

## 2015-03-01 DIAGNOSIS — M79672 Pain in left foot: Secondary | ICD-10-CM | POA: Diagnosis not present

## 2015-03-01 DIAGNOSIS — M216X2 Other acquired deformities of left foot: Secondary | ICD-10-CM | POA: Diagnosis not present

## 2015-03-01 DIAGNOSIS — G8929 Other chronic pain: Secondary | ICD-10-CM | POA: Diagnosis not present

## 2015-03-03 DIAGNOSIS — G5603 Carpal tunnel syndrome, bilateral upper limbs: Secondary | ICD-10-CM | POA: Diagnosis not present

## 2015-03-03 DIAGNOSIS — R2689 Other abnormalities of gait and mobility: Secondary | ICD-10-CM | POA: Diagnosis not present

## 2015-03-03 DIAGNOSIS — S82032D Displaced transverse fracture of left patella, subsequent encounter for closed fracture with routine healing: Secondary | ICD-10-CM | POA: Diagnosis not present

## 2015-03-03 DIAGNOSIS — S82002D Unspecified fracture of left patella, subsequent encounter for closed fracture with routine healing: Secondary | ICD-10-CM | POA: Diagnosis not present

## 2015-03-16 DIAGNOSIS — J439 Emphysema, unspecified: Secondary | ICD-10-CM | POA: Diagnosis not present

## 2015-03-16 DIAGNOSIS — G4730 Sleep apnea, unspecified: Secondary | ICD-10-CM | POA: Diagnosis not present

## 2015-03-16 DIAGNOSIS — I10 Essential (primary) hypertension: Secondary | ICD-10-CM | POA: Diagnosis not present

## 2015-03-18 DIAGNOSIS — I10 Essential (primary) hypertension: Secondary | ICD-10-CM | POA: Diagnosis not present

## 2015-03-18 DIAGNOSIS — J438 Other emphysema: Secondary | ICD-10-CM | POA: Diagnosis not present

## 2015-03-18 DIAGNOSIS — E034 Atrophy of thyroid (acquired): Secondary | ICD-10-CM | POA: Diagnosis not present

## 2015-03-18 DIAGNOSIS — M159 Polyosteoarthritis, unspecified: Secondary | ICD-10-CM | POA: Diagnosis not present

## 2015-03-18 DIAGNOSIS — G4733 Obstructive sleep apnea (adult) (pediatric): Secondary | ICD-10-CM | POA: Diagnosis not present

## 2015-03-18 DIAGNOSIS — E7800 Pure hypercholesterolemia, unspecified: Secondary | ICD-10-CM | POA: Diagnosis not present

## 2015-03-18 DIAGNOSIS — G629 Polyneuropathy, unspecified: Secondary | ICD-10-CM | POA: Diagnosis not present

## 2015-03-18 DIAGNOSIS — M4806 Spinal stenosis, lumbar region: Secondary | ICD-10-CM | POA: Diagnosis not present

## 2015-03-23 ENCOUNTER — Ambulatory Visit: Payer: Commercial Managed Care - HMO | Attending: Neurology | Admitting: Physical Therapy

## 2015-03-23 ENCOUNTER — Encounter: Payer: Self-pay | Admitting: Physical Therapy

## 2015-03-23 DIAGNOSIS — M6281 Muscle weakness (generalized): Secondary | ICD-10-CM | POA: Diagnosis not present

## 2015-03-23 DIAGNOSIS — R2689 Other abnormalities of gait and mobility: Secondary | ICD-10-CM | POA: Diagnosis not present

## 2015-03-23 DIAGNOSIS — R269 Unspecified abnormalities of gait and mobility: Secondary | ICD-10-CM | POA: Diagnosis not present

## 2015-03-23 DIAGNOSIS — Z9181 History of falling: Secondary | ICD-10-CM | POA: Insufficient documentation

## 2015-03-23 NOTE — Therapy (Signed)
[place] MAIN [facility] SERVICES [address] [place], [name], [address] Phone: [phone]   Fax:  [phone]  Physical Therapy Evaluation  Patient Details  Name: Hannah Powell MRN: [id number] Date of Birth: 1945-10-18 Referring Provider:  Dr. Jennings Books  Encounter Date: 03/23/2015      PT End of Session - 03/23/15 1136    Visit Number 1   Number of Visits 17   Date for PT Re-Evaluation 05/18/15   Authorization Type 1   Authorization Time Period 10   PT Start Time 1005   PT Stop Time 1100   PT Time Calculation (min) 55 min   Equipment Utilized During Treatment Gait belt   Activity Tolerance Patient tolerated treatment well;No increased pain;Patient limited by fatigue   Behavior During Therapy [facility] for tasks assessed/performed      Past Medical History  Diagnosis Date  . CHF (congestive heart failure) ([place])   . Shortness of breath   . Hypertension     dr Otho Najjar     . Hypothyroidism   . Anxiety   . Arthritis   . RLS (restless legs syndrome)   . Sleep apnea     cpap      >5 yrs    Past Surgical History  Procedure Laterality Date  . Back surgery      cervical  . Joint replacement      x2 tkr  . Foot arthroplasty    . Total shoulder arthroplasty Left 07/02/2013    Procedure: LEFT TOTAL SHOULDER ARTHROPLASTY;  Surgeon: Marin Shutter, MD;  Location: [facility];  Service: Orthopedics;  Laterality: Left;    There were no vitals filed for this visit.  Visit Diagnosis:  Abnormality of gait - Plan: PT plan of care cert/re-cert  Generalized muscle weakness - Plan: PT plan of care cert/re-cert  Imbalance - Plan: PT plan of care cert/re-cert  At risk for falls - Plan: PT plan of care cert/re-cert      Subjective Assessment - 03/23/15 1012    Subjective 70 yo Female came to PT evaluation in June 2016 for balance deficits. Then she reports that she started feeling left knee pain and went to see orthopedic doctor who diagnosed  her with left knee patella fracture. she was given a knee immobilizer (july until november 2016)  she started using crutches or in a wheelchair at that time to be "off" her knee to avoid surgery; She reports that her patella is doing better. She has been released from orthopedic MD to gradually get back to where she was prior to injury;  Has numbness/tingling in right hand which is related to carpal tunnel since using crutches; managed with brace;    Pertinent History personal factors affecting rehab: recent left patella fracture (restrictions of no squats/lunges or anything that would put extra pressure on knee); 5 steps to enter house ( B Rails); CHF (controlled but has shortness of breath with activity), High fall risk, obesity   How long can you sit comfortably? unlimited   How long can you stand comfortably? 3-4 hours   How long can you walk comfortably? 30 + minutes   Diagnostic tests last x-rays of left knee where in November 2016 which looked good;    Patient Stated Goals "get rid of crutches and cane and be able to walk unassisted"; its been 3 years since she walked unassisted;    Currently in Pain? No/denies  [facility] PT Assessment - 03/23/15 0001    Assessment   Medical Diagnosis Impaired balance   Referring Provider  Dr. Jennings Books   Onset Date/Surgical Date 03/19/14   Hand Dominance Right   Next MD Visit 6 months   Prior Therapy Had PT evaluation 6 months ago for balance but was put on hold due to left patella fracture; just now able to start back; no other PT for this condition;    Precautions   Precautions Fall   Precaution Comments knee  knee restrictions- no lunges/squats   Restrictions   Weight Bearing Restrictions No   Balance Screen   Has the patient fallen in the past 6 months No   Has the patient had a decrease in activity level because of a fear of falling?  Yes   Is the patient reluctant to leave their home because of a fear of falling?  No   Home  Environment   Additional Comments 5 steps to enter house with B rails; single level home; lives with husband; has multiple AD including WC, crutches, cane, walker, bedside commode, shower seat etc;    Prior Function   Level of Independence Independent with basic ADLs;Requires assistive device for independence   Vocation Retired   Leisure swim, read, play with grandchildren   Cognition   Overall Cognitive Status Within Functional Limits for tasks assessed   Observation/Other Assessments   Observations demonstrates increased swelling in BLE (L>R); reports that this is following knee surgery and that she would always have swelling; no skin breakdown noted; no weeping;    Sensation   Light Touch Impaired by gross assessment  does have numnbess along left knee, intact light touch;   Hot/Cold Appears Intact   Additional Comments reports numnbess/tingling in BUE hands related to carpal tunnel; intact light touch sensation and deep pressure;   Coordination   Gross Motor Movements are Fluid and Coordinated Yes   Fine Motor Movements are Fluid and Coordinated Yes   Sit to Stand   Comments requires hands to push up from chair with sit<>Stand transfers;   Posture/Postural Control   Posture Comments demonstrates forward slumped posture with gait tasks especially when using crutches;   AROM   Overall AROM Comments BUE and BLE AROM is [facility]   Strength   Overall Strength Comments BUE gross strength is WNL   Right Hip Flexion 4/5   Right Hip Extension 4-/5   Right Hip ABduction 4/5   Right Hip ADduction 4/5   Left Hip Flexion 4-/5   Left Hip Extension 4/5   Left Hip ABduction 4-/5   Left Hip ADduction 4-/5   Right Knee Flexion 4+/5   Right Knee Extension 4/5   Left Knee Flexion 4+/5   Left Knee Extension 4+/5   Right Ankle Dorsiflexion 4/5   Right Ankle Plantar Flexion 3/5   Left Ankle Dorsiflexion 4-/5   Left Ankle Plantar Flexion 3/5   Palpation   Palpation comment reports mild tenderness  to palpation of patella;    Ambulation/Gait   Gait Comments ambulates with BUE crutches with distant supervision, slower gait speed, 4 point gait pattern with increased slumped posture; ambulates with SPC, close supervision slower gait speed, improved posture but wider base of support and decreased step length   6 minute walk test results    Aerobic Endurance Distance Walked 490   Endurance additional comments using BUE crutches (limited ambulator, 1000 feet is community ambulator);    Standardized Balance Assessment   Five  times sit to stand comments  16 sec with BUE pushing on arm rests; >15 sec without pushing on arm rests indicates increased risk for falls;   10 Meter Walk 0.54 m/s with crutches (BUE), and 0.43 m/s with SPC ;(high fall risk, limited home ambulator)   High Level Balance   High Level Balance Comments able to hold SLS for 1 sec on RLE, unable to hold SLS on LLE; modified tandem stance for 10 sec with increased sway; able to stand with feet together with eyes closed without loss of balance;                           PT Education - 03/23/15 1136    Education provided Yes   Education Details findings, recommendations, plan of care   Person(s) Educated Patient   Methods Explanation   Comprehension Verbalized understanding             PT Long Term Goals - 03/23/15 1141    PT LONG TERM GOAL #1   Title Patient will be independent in home exercise program to improve strength/mobility for better functional independence with ADLs. by 05/18/15   Time 8   Period Weeks   Status New   PT LONG TERM GOAL #2   Title Patient (> 76 years old) will complete five times sit to stand test with 1 UE or less in < 15 seconds indicating an increased LE strength and improved balance. by 05/18/15   Time 8   Period Weeks   Status New   PT LONG TERM GOAL #3   Title Patient will increase six minute walk test distance to >1000 for progression to community ambulator and improve  gait ability by 05/18/15   Time 8   Period Weeks   Status New   PT LONG TERM GOAL #4   Title Patient will increase 10 meter walk test to >1.41m/s as to improve gait speed for better community ambulation and to reduce fall risk. by 05/18/15   Time 8   Period Weeks   Status New   PT LONG TERM GOAL #5   Title Patient will be able to ambulate at least 50 feet without AD demonstrating good posture and safety, independently for increased home ambulation by 05/18/15   Time 8   Period Weeks   Status New   PT LONG TERM GOAL #6   Title Patient will increase Berg Balance score to >42/56 to demonstrate decreased fall risk during functional activities.   Time 8   Period Weeks   Status New               Plan - 03/23/15 1137    Clinical Impression Statement 70 yo pleasant female was referred to physical therapy with impaired balance. Patient is s/p left patella fracture in June/July of 2016 and was recently released by orthopedic MD to slowly resume functional mobility. She exhibits increased weakness in BLE (L>R); Patient also has increased swelling in LLE due to past surgeries. She is obese which limits mobiltiy and has CHF with increased shortness of breath; Patient tested as a high fall risk during balance and gait testing. She currently ambulates with BUE axillary crutches and is close supervision for gait tasks with SPC. She would benefit from additional skilled PT intervention to improve strength, gait ability, balance and functional independence with ADLs.    Pt will benefit from skilled therapeutic intervention in order to improve on the following deficits  Abnormal gait;Decreased endurance;Impaired sensation;Obesity;Cardiopulmonary status limiting activity;Decreased activity tolerance;Decreased strength;Difficulty walking;Decreased mobility;Decreased balance;Impaired flexibility;Decreased safety awareness   Rehab Potential Good   Clinical Impairments Affecting Rehab Potential positive:  motivation, family support, negative: co-morbidities, deconditioned, high fall risk; Body structures that will need to be addressed: weakness, numbness/tingling, impaired balance, difficulty walking, decreased transfer ability, etc; Due to co-morbidities her clinical presenstation is evolving;    PT Frequency 2x / week   PT Duration 8 weeks   PT Treatment/Interventions Aquatic Therapy;Cryotherapy;Electrical Stimulation;Moist Heat;Balance training;Therapeutic exercise;Therapeutic activities;Functional mobility training;Stair training;Gait training;DME Instruction;Neuromuscular re-education;Patient/family education;Energy conservation   PT Next Visit Plan initiate HEP   PT Home Exercise Plan will address next visit   Consulted and Agree with Plan of Care Patient          G-Codes - 04/10/2015 1144    Functional Assessment Tool Used 6 Minute walk test, 5x sit to stand, 62m walk time,clinical judgement   Functional Limitation Mobility: Walking and moving around   Mobility: Walking and Moving Around Current Status [id number]) At least 40 percent but less than 60 percent impaired, limited or restricted   Mobility: Walking and Moving Around Goal Status [phone]) At least 20 percent but less than 40 percent impaired, limited or restricted       Problem List Patient Active Problem List   Diagnosis Date Noted  . S/P shoulder replacement 07/02/2013    Jesiel Garate PT,DPT 04-10-15, 11:46 AM  [place] MAIN [facility] SERVICES [place], [name], [address] Phone: [phone]   Fax:  [phone]  Name: Hannah Powell MRN: [id number] Date of Birth: 1945-05-07

## 2015-03-28 ENCOUNTER — Ambulatory Visit: Payer: Commercial Managed Care - HMO | Admitting: Physical Therapy

## 2015-03-29 DIAGNOSIS — M79672 Pain in left foot: Secondary | ICD-10-CM | POA: Diagnosis not present

## 2015-03-29 DIAGNOSIS — M216X2 Other acquired deformities of left foot: Secondary | ICD-10-CM | POA: Diagnosis not present

## 2015-03-29 DIAGNOSIS — G8929 Other chronic pain: Secondary | ICD-10-CM | POA: Diagnosis not present

## 2015-03-30 ENCOUNTER — Ambulatory Visit: Payer: Commercial Managed Care - HMO | Admitting: Physical Therapy

## 2015-03-30 ENCOUNTER — Encounter: Payer: Self-pay | Admitting: Physical Therapy

## 2015-03-30 DIAGNOSIS — Z9181 History of falling: Secondary | ICD-10-CM | POA: Diagnosis not present

## 2015-03-30 DIAGNOSIS — R269 Unspecified abnormalities of gait and mobility: Secondary | ICD-10-CM

## 2015-03-30 DIAGNOSIS — M6281 Muscle weakness (generalized): Secondary | ICD-10-CM

## 2015-03-30 DIAGNOSIS — R2689 Other abnormalities of gait and mobility: Secondary | ICD-10-CM | POA: Diagnosis not present

## 2015-03-30 NOTE — Therapy (Signed)
[place] MAIN [facility] SERVICES [address] [place], [name], [address] Phone: [phone]   Fax:  [phone]  Physical Therapy Treatment  Patient Details  Name: Hannah Powell MRN: [id number] Date of Birth: November 13, 1945 Referring Provider:  Dr. Jennings Books  Encounter Date: 03/30/2015      PT End of Session - 03/30/15 0942    Visit Number 2   Number of Visits 17   Date for PT Re-Evaluation 05/18/15   Authorization Type 2   Authorization Time Period 10   PT Start Time 0935   PT Stop Time 1016   PT Time Calculation (min) 41 min   Equipment Utilized During Treatment Gait belt   Activity Tolerance Patient tolerated treatment well;No increased pain;Patient limited by fatigue   Behavior During Therapy [facility] for tasks assessed/performed      Past Medical History  Diagnosis Date  . CHF (congestive heart failure) ([place])   . Shortness of breath   . Hypertension     dr Otho Najjar     . Hypothyroidism   . Anxiety   . Arthritis   . RLS (restless legs syndrome)   . Sleep apnea     cpap      >5 yrs    Past Surgical History  Procedure Laterality Date  . Back surgery      cervical  . Joint replacement      x2 tkr  . Foot arthroplasty    . Total shoulder arthroplasty Left 07/02/2013    Procedure: LEFT TOTAL SHOULDER ARTHROPLASTY;  Surgeon: Marin Shutter, MD;  Location: [facility];  Service: Orthopedics;  Laterality: Left;    There were no vitals filed for this visit.  Visit Diagnosis:  Abnormality of gait  Generalized muscle weakness  Imbalance  At risk for falls      Subjective Assessment - 03/30/15 0940    Subjective Patient reports doing well. She reports, "I'm a little tired this morning. I don't know what possessed me to have a 14 appointment"; denies any new falls; presents to therapy with B axillary crutches;    Pertinent History personal factors affecting rehab: recent left patella fracture (restrictions of no squats/lunges  or anything that would put extra pressure on knee); 5 steps to enter house ( B Rails); CHF (controlled but has shortness of breath with activity), High fall risk, obesity   How long can you sit comfortably? unlimited   How long can you stand comfortably? 3-4 hours   How long can you walk comfortably? 30 + minutes   Diagnostic tests last x-rays of left knee where in November 2016 which looked good;    Patient Stated Goals "get rid of crutches and cane and be able to walk unassisted"; its been 3 years since she walked unassisted;    Currently in Pain? No/denies      TREATMENT: Warm up on Nustep level 1 BUE/BLE x5 min (2 min unbilled, with min VCs to increase left knee flexion to improve flexibility);  Instructed patient in HEP:  Forward/backward walking with 1 rail assist 10 feet x5 laps each direction progressing to finger tip hold for forward gait; Tandem stance with 1-0 rail assist 10 sec hold x3 each foot in front; SLS with 1-0 rail assist 10 sec hold x3 each LE;  Patient required min VCs for balance stability, including to increase trunk control for less loss of balance with smaller base of support  Standing with red tband around both ankles: Hip abduction  x10 bilaterally; Hip extension x10 bilaterally; Side stepping 10 feet x3 laps each direction; Patient required min VCs to avoid trunk lean and to increase ROM for better strengthening;  Forward step up on 4 inch step with 2-1 rail assist x10 each LE leading;  Patient ambulates without AD, 10 feet max with decreased right LE weight shift and increased hip abductor weakness with decreased right stance and shorter left step length;  She verbalized and demonstrates independence in HEP;                          PT Education - 03/30/15 [phone]    Education provided Yes   Education Details HEP, balance/strengthening exercise   Person(s) Educated Patient   Methods Explanation;Verbal cues   Comprehension  Verbalized understanding;Verbal cues required;Returned demonstration             PT Long Term Goals - 03/23/15 1141    PT LONG TERM GOAL #1   Title Patient will be independent in home exercise program to improve strength/mobility for better functional independence with ADLs. by 05/18/15   Time 8   Period Weeks   Status New   PT LONG TERM GOAL #2   Title Patient (> 30 years old) will complete five times sit to stand test with 1 UE or less in < 15 seconds indicating an increased LE strength and improved balance. by 05/18/15   Time 8   Period Weeks   Status New   PT LONG TERM GOAL #3   Title Patient will increase six minute walk test distance to >1000 for progression to community ambulator and improve gait ability by 05/18/15   Time 8   Period Weeks   Status New   PT LONG TERM GOAL #4   Title Patient will increase 10 meter walk test to >1.15m/s as to improve gait speed for better community ambulation and to reduce fall risk. by 05/18/15   Time 8   Period Weeks   Status New   PT LONG TERM GOAL #5   Title Patient will be able to ambulate at least 50 feet without AD demonstrating good posture and safety, independently for increased home ambulation by 05/18/15   Time 8   Period Weeks   Status New   PT LONG TERM GOAL #6   Title Patient will increase Berg Balance score to >42/56 to demonstrate decreased fall risk during functional activities.   Time 8   Period Weeks   Status New               Plan - 03/30/15 [id number]    Clinical Impression Statement Patient instructed in HEP including balance/LE strengthening exercise. She required min VCs for correct exercise technique. She is hesitant to shift weight to LLE due to past knee injury. Patient would benefit from additional skilled PT intervention to improve balanc/gait safety and increase LE strength.    Pt will benefit from skilled therapeutic intervention in order to improve on the following deficits Abnormal gait;Decreased  endurance;Impaired sensation;Obesity;Cardiopulmonary status limiting activity;Decreased activity tolerance;Decreased strength;Difficulty walking;Decreased mobility;Decreased balance;Impaired flexibility;Decreased safety awareness   Rehab Potential Good   Clinical Impairments Affecting Rehab Potential positive: motivation, family support, negative: co-morbidities, deconditioned, high fall risk; Body structures that will need to be addressed: weakness, numbness/tingling, impaired balance, difficulty walking, decreased transfer ability, etc; Due to co-morbidities her clinical presenstation is evolving;    PT Frequency 2x / week   PT Duration 8 weeks  PT Treatment/Interventions Aquatic Therapy;Cryotherapy;Electrical Stimulation;Moist Heat;Balance training;Therapeutic exercise;Therapeutic activities;Functional mobility training;Stair training;Gait training;DME Instruction;Neuromuscular re-education;Patient/family education;Energy conservation   PT Next Visit Plan initiate HEP   PT Home Exercise Plan will address next visit   Consulted and Agree with Plan of Care Patient        Problem List Patient Active Problem List   Diagnosis Date Noted  . S/P shoulder replacement 07/02/2013    Trotter,Margaret PT, DPT 03/30/2015, 10:16 AM  [place] MAIN [facility] SERVICES [place], [name], [address] Phone: [phone]   Fax:  [phone]  Name: Hannah Powell MRN: [id number] Date of Birth: 08-Sep-1945

## 2015-03-30 NOTE — Patient Instructions (Signed)
Copyright  VHI. All rights reserved.  Backward Walking   Walk backward, toes of each foot coming down first. Take long, even strides. Make sure you have a clear pathway with no obstructions when you do this. Stand beside counter and walk backward  And then walk forward doing opposite directions; repeat 10 laps 2x a day at least 5 days a week.  Copyright  VHI. All rights reserved.  Tandem Walking   Stand beside kitchen sink and place one foot in front of the other, lift your hand and try to hold position for 10 sec. Repeat with other foot in front; Repeat 5 reps with each foot in front 5 days a week.Balance: Unilateral   Attempt to balance on left leg, eyes open. Hold _5-10___ seconds.Start with holding onto counter and if you get your balance you can try to let go of counter. Repeat __5__ times per set. Do __1__ sets per session. Do __1__ sessions per day. Keep eyes open:   [email or web address].[email or web address]   Copyright  VHI. All rights reserved.   Balance, Proprioception: Hip Abduction With Tubing   With tubing attached to both ankles, Standing holding onto counter, kick one leg out to side and then Return.  Repeat _10___ times  On each side.  Do ___2_ sessions per day.  [email or web address].[email or web address].[email or web address]    Copyright  VHI. All rights reserved.  Balance, Proprioception: Hip Extension With Tubing   With tubing tied around both legs, holding onto kitchen counter, swing leg back. Return. Repeat _10___ times . Do __2__ sessions per day.  [email or web address]://cc.[email or web address].[email or web address]     Copyright  VHI. All rights reserved.  Band Walk: Side Stepping   Tie band around legs, around ankles. Step _10__ feet to one side, then step back to start. Repeat _2-3__ feet per session. Note: Small towel between band and skin eases rubbing.

## 2015-04-01 ENCOUNTER — Encounter: Payer: Commercial Managed Care - HMO | Admitting: Physical Therapy

## 2015-04-01 ENCOUNTER — Encounter: Payer: Self-pay | Admitting: Physical Therapy

## 2015-04-01 ENCOUNTER — Ambulatory Visit: Payer: Commercial Managed Care - HMO | Admitting: Physical Therapy

## 2015-04-01 DIAGNOSIS — Z9181 History of falling: Secondary | ICD-10-CM

## 2015-04-01 DIAGNOSIS — R269 Unspecified abnormalities of gait and mobility: Secondary | ICD-10-CM | POA: Diagnosis not present

## 2015-04-01 DIAGNOSIS — R2689 Other abnormalities of gait and mobility: Secondary | ICD-10-CM

## 2015-04-01 DIAGNOSIS — M6281 Muscle weakness (generalized): Secondary | ICD-10-CM | POA: Diagnosis not present

## 2015-04-01 NOTE — Patient Instructions (Addendum)
  Copyright  VHI. All rights reserved.   Lower Trunk Rotation Stretch  Lying on back with knees bent, Keeping back flat and feet together, rotate knees side to side slowly and in pain free range of motion.  Hold _2___ seconds. Repeat for 1-2 minutes. Do __1__ sets per session. Do __2-3__ sessions per day.  .    Copyright  VHI. All rights reserved.  Abduction: Side Leg Lift (Eccentric) - Side-Lying   Lie on side. Lift top leg slightly higher than shoulder level. Keep top leg straight with body, toes pointing forward. Slowly lower for 3-5 seconds. _10__ reps per set, _2-3__ sets per day, __5_ days per week. Be sure to keep stomach tight and avoid rotating spine  Copyright  VHI. All rights reserved.  Strengthening: Straight Leg Raise (Phase 1)  Lying on back with one knee bent and one knee straight Tighten muscles on front of right thigh, then lift leg _3-5___ inches from surface, keeping knee locked.  Repeat __10__ times per set. Do __2-3__ sets per session. Do __1__ sessions per day.

## 2015-04-01 NOTE — Therapy (Signed)
Hannah Powell [facility] SERVICES [address] [place], [name], [address] Phone: [phone]   Fax:  [phone]  Physical Therapy Treatment  Patient Details  Name: Hannah Powell MRN: [id number] Date of Birth: Mar 18, 1946 Referring Provider:  Dr. Jennings Books  Encounter Date: 04/01/2015      PT End of Session - 04/01/15 1007    Visit Number 3   Number of Visits 17   Date for PT Re-Evaluation 05/18/15   Authorization Type 3   Authorization Time Period 10   PT Start Time 1003   PT Stop Time 1045   PT Time Calculation (min) 42 min   Equipment Utilized During Treatment Gait belt   Activity Tolerance Patient tolerated treatment well;Patient limited by fatigue;No increased pain   Behavior During Therapy [facility] for tasks assessed/performed      Past Medical History  Diagnosis Date  . CHF (congestive heart failure) ([place])   . Shortness of breath   . Hypertension     dr Otho Najjar     . Hypothyroidism   . Anxiety   . Arthritis   . RLS (restless legs syndrome)   . Sleep apnea     cpap      >5 yrs    Past Surgical History  Procedure Laterality Date  . Back surgery      cervical  . Joint replacement      x2 tkr  . Foot arthroplasty    . Total shoulder arthroplasty Left 07/02/2013    Procedure: LEFT TOTAL SHOULDER ARTHROPLASTY;  Surgeon: Marin Shutter, MD;  Location: [place];  Service: Orthopedics;  Laterality: Left;    There were no vitals filed for this visit.  Visit Diagnosis:  Abnormality of gait  Generalized muscle weakness  Imbalance  At risk for falls      Subjective Assessment - 04/01/15 1006    Subjective Patient reports increased soreness in left knee after last treatment session; She denies any increase in swelling, denies any numbness or sharp pain. "I think that I just overdid it a little bit."   Pertinent History personal factors affecting rehab: recent left patella fracture (restrictions of no squats/lunges or  anything that would put extra pressure on knee); 5 steps to enter house ( B Rails); CHF (controlled but has shortness of breath with activity), High fall risk, obesity   How long can you sit comfortably? unlimited   How long can you stand comfortably? 3-4 hours   How long can you walk comfortably? 30 + minutes   Diagnostic tests last x-rays of left knee where in November 2016 which looked good;    Patient Stated Goals "get rid of crutches and cane and be able to walk unassisted"; its been 3 years since she walked unassisted;    Currently in Pain? Yes   Pain Score 3    Pain Location Knee   Pain Orientation Left   Pain Descriptors / Indicators Sore   Pain Type Chronic pain     TREATMENT: Warm up on Treadmill 1.0 mph with 2-1 rail assist, x4 min with cues to increase step length for increased reciprocal gait pattern, increase erect posture and to improve arm swing with less rail assist;  Supine: Lumbar trunk rotation x1 min each direction for increased flexibility; SLR flexion x15 bilaterally with min VCs to increase core abdominal stabilization for less back discomfort. Sidelying, hip abduction SLR 2x10 bilaterally with mod VCs to stay on side and reduce hip ER for  increased abductor strengthening;  Patient fearful of lying prone due to past knee injury;  Gait with 1 axillary crutch 40 feet x1 with min VCs to improve gait speed for less loss of balance;  Standing with back against wall, single leg march with tactile cues to increase ROM for increased hip flexion strength and to improve contralateral LE hip extensor strength x10 bilaterally; Patient required 1 axillary crutch for safety;  Advanced HEP- see patient instructions;                            PT Education - 04/01/15 1006    Education provided Yes   Education Details LE strengthening, balance and gait safety;    Person(s) Educated Patient   Methods Explanation;Verbal cues;Handout   Comprehension  Verbalized understanding;Returned demonstration;Verbal cues required             PT Long Term Goals - 03/23/15 1141    PT LONG TERM GOAL #1   Title Patient will be independent in home exercise program to improve strength/mobility for better functional independence with ADLs. by 05/18/15   Time 8   Period Weeks   Status New   PT LONG TERM GOAL #2   Title Patient (> 1 years old) will complete five times sit to stand test with 1 UE or less in < 15 seconds indicating an increased LE strength and improved balance. by 05/18/15   Time 8   Period Weeks   Status New   PT LONG TERM GOAL #3   Title Patient will increase six minute walk test distance to >1000 for progression to community ambulator and improve gait ability by 05/18/15   Time 8   Period Weeks   Status New   PT LONG TERM GOAL #4   Title Patient will increase 10 meter walk test to >1.8m/s as to improve gait speed for better community ambulation and to reduce fall risk. by 05/18/15   Time 8   Period Weeks   Status New   PT LONG TERM GOAL #5   Title Patient will be able to ambulate at least 50 feet without AD demonstrating good posture and safety, independently for increased home ambulation by 05/18/15   Time 8   Period Weeks   Status New   PT LONG TERM GOAL #6   Title Patient will increase Berg Balance score to >42/56 to demonstrate decreased fall risk during functional activities.   Time 8   Period Weeks   Status New               Plan - 04/01/15 1309    Clinical Impression Statement Patient instructed in BLE strengthening. Advanced HEP with hooklying/sidelying position to improve hip strength. She required mod VCs for correct positioning to improve strengthening. Patient is still hesitant with gait and standing tasks with less HHA due to fear of falling and LE weakness. She would benefit from additional skilled PT intervention to improve LE strength, balance and gait safety;    Pt will benefit from skilled therapeutic  intervention in order to improve on the following deficits Abnormal gait;Decreased endurance;Impaired sensation;Obesity;Cardiopulmonary status limiting activity;Decreased activity tolerance;Decreased strength;Difficulty walking;Decreased mobility;Decreased balance;Impaired flexibility;Decreased safety awareness   Rehab Potential Good   Clinical Impairments Affecting Rehab Potential positive: motivation, family support, negative: co-morbidities, deconditioned, high fall risk; Body structures that will need to be addressed: weakness, numbness/tingling, impaired balance, difficulty walking, decreased transfer ability, etc; Due to co-morbidities her clinical presenstation  is evolving;    PT Frequency 2x / week   PT Duration 8 weeks   PT Treatment/Interventions Aquatic Therapy;Cryotherapy;Electrical Stimulation;Moist Heat;Balance training;Therapeutic exercise;Therapeutic activities;Functional mobility training;Stair training;Gait training;DME Instruction;Neuromuscular re-education;Patient/family education;Energy conservation   PT Next Visit Plan initiate HEP   PT Home Exercise Plan will address next visit   Consulted and Agree with Plan of Care Patient        Problem List Patient Active Problem List   Diagnosis Date Noted  . S/P shoulder replacement 07/02/2013    Wendy Hoback PT, DPT 04/01/2015, 1:11 PM  [place] [facility] SERVICES [address] [place], [name], [address] Phone: [phone]   Fax:  [phone]  Name: Hannah Powell MRN: [id number] Date of Birth: [birth year]

## 2015-04-04 ENCOUNTER — Ambulatory Visit: Payer: Commercial Managed Care - HMO | Admitting: Physical Therapy

## 2015-04-04 ENCOUNTER — Encounter: Payer: Self-pay | Admitting: Physical Therapy

## 2015-04-04 DIAGNOSIS — R269 Unspecified abnormalities of gait and mobility: Secondary | ICD-10-CM | POA: Diagnosis not present

## 2015-04-04 DIAGNOSIS — R2689 Other abnormalities of gait and mobility: Secondary | ICD-10-CM | POA: Diagnosis not present

## 2015-04-04 DIAGNOSIS — M6281 Muscle weakness (generalized): Secondary | ICD-10-CM

## 2015-04-04 DIAGNOSIS — Z9181 History of falling: Secondary | ICD-10-CM | POA: Diagnosis not present

## 2015-04-04 NOTE — Therapy (Signed)
[place] [facility] SERVICES [address] [place], [name], [address] Phone: [phone]   Fax:  [phone]  Physical Therapy Treatment  Patient Details  Name: Hannah Powell MRN: [id number] Date of Birth: 1945-09-07 Referring Provider:  Dr. Jennings Books  Encounter Date: 04/04/2015      PT End of Session - 04/04/15 1031    Visit Number 4   Number of Visits 17   Date for PT Re-Evaluation 05/18/15   Authorization Type 4   Authorization Time Period 10   PT Start Time 1028   PT Stop Time 1107   PT Time Calculation (min) 39 min   Equipment Utilized During Treatment Gait belt   Activity Tolerance Patient tolerated treatment well;Patient limited by fatigue;No increased pain   Behavior During Therapy [facility] for tasks assessed/performed      Past Medical History  Diagnosis Date  . CHF (congestive heart failure) ([place])   . Shortness of breath   . Hypertension     dr Otho Najjar     . Hypothyroidism   . Anxiety   . Arthritis   . RLS (restless legs syndrome)   . Sleep apnea     cpap      >5 yrs    Past Surgical History  Procedure Laterality Date  . Back surgery      cervical  . Joint replacement      x2 tkr  . Foot arthroplasty    . Total shoulder arthroplasty Left 07/02/2013    Procedure: LEFT TOTAL SHOULDER ARTHROPLASTY;  Surgeon: Marin Shutter, MD;  Location: [place];  Service: Orthopedics;  Laterality: Left;    There were no vitals filed for this visit.  Visit Diagnosis:  Abnormality of gait  Generalized muscle weakness  Imbalance  At risk for falls      Subjective Assessment - 04/04/15 1027    Subjective Patient reports doing well today; She did have some discomfort over the weekend in her knee. Patient reports no pain this morning. She thinks that it is related to increased exercise. She reports doing more walking and more activity. "Those exercises are hard."   Pertinent History personal factors affecting rehab:  recent left patella fracture (restrictions of no squats/lunges or anything that would put extra pressure on knee); 5 steps to enter house ( B Rails); CHF (controlled but has shortness of breath with activity), High fall risk, obesity   How long can you sit comfortably? unlimited   How long can you stand comfortably? 3-4 hours   How long can you walk comfortably? 30 + minutes   Diagnostic tests last x-rays of left knee where in November 2016 which looked good;    Patient Stated Goals "get rid of crutches and cane and be able to walk unassisted"; its been 3 years since she walked unassisted;    Currently in Pain? No/denies        TREATMENT: Warm up on Treadmill 1.0 mph with 2-1 rail assist, x4 min with cues to increase step length for increased reciprocal gait pattern, increase erect posture and to improve arm swing with less rail assist;  Standing with red tband around both ankles: Hip extension 2x15 bilaterally with min VCs to increase gluteal squeeze; Side stepping 10 feet x3 laps with 1  rail assist;  Alternate toe taps on 4 inch step with 2-1 rail assist, 10 sec each UE hold x1 min total; Hip flexion march against green tband 2x10 bilaterally with B rail assist;  Patient required min-moderate verbal/tactile cues for correct exercise technique.   Gait with left crutch: Over 1/2 bolsters x8 with min VCs to advance crutch prior to stepping over with LE; Weaving around cones #3, x3 Picking up cones #3 with min VCs to adjust hand placement with crutch for better pick up ability;  Standing in parallel bars on airex x2: Without rail assist, head turns side/side x5, up/down x5 with min VCs for gaze stabilization; Without rail assist, head turns multiple directions x1-2 min with cues for improved left weight shift;  Patient required min VCs for balance stability, including to increase trunk control for less loss of balance with smaller base of  support                             PT Education - 04/04/15 1031    Education provided Yes   Education Details LE strengthening, gait safety;    Person(s) Educated Patient   Methods Explanation;Verbal cues   Comprehension Verbalized understanding;Returned demonstration;Verbal cues required             PT Long Term Goals - 03/23/15 1141    PT LONG TERM GOAL #1   Title Patient will be independent in home exercise program to improve strength/mobility for better functional independence with ADLs. by 05/18/15   Time 8   Period Weeks   Status New   PT LONG TERM GOAL #2   Title Patient (> 33 years old) will complete five times sit to stand test with 1 UE or less in < 15 seconds indicating an increased LE strength and improved balance. by 05/18/15   Time 8   Period Weeks   Status New   PT LONG TERM GOAL #3   Title Patient will increase six minute walk test distance to >1000 for progression to community ambulator and improve gait ability by 05/18/15   Time 8   Period Weeks   Status New   PT LONG TERM GOAL #4   Title Patient will increase 10 meter walk test to >1.4m/s as to improve gait speed for better community ambulation and to reduce fall risk. by 05/18/15   Time 8   Period Weeks   Status New   PT LONG TERM GOAL #5   Title Patient will be able to ambulate at least 50 feet without AD demonstrating good posture and safety, independently for increased home ambulation by 05/18/15   Time 8   Period Weeks   Status New   PT LONG TERM GOAL #6   Title Patient will increase Berg Balance score to >42/56 to demonstrate decreased fall risk during functional activities.   Time 8   Period Weeks   Status New               Plan - 04/04/15 1106    Clinical Impression Statement Patient instructed in advanced BLE strengthening exercise. She continues to demonstrate decreased hip abductor weakness with difficulty reducing HHA with side stepping; Patient required min VCs  for correct exercise technique for better strengthening. Instructed patient in better gait safety with 1 crutch negotiating obstacles; also instructed patient in static/dynamic balance exercise. She would benefit from additional skilled PT intervention to improve balance/gait safety and reduce risk for falls;    Pt will benefit from skilled therapeutic intervention in order to improve on the following deficits Abnormal gait;Decreased endurance;Impaired sensation;Obesity;Cardiopulmonary status limiting activity;Decreased activity tolerance;Decreased strength;Difficulty walking;Decreased mobility;Decreased balance;Impaired flexibility;Decreased safety awareness  Rehab Potential Good   Clinical Impairments Affecting Rehab Potential positive: motivation, family support, negative: co-morbidities, deconditioned, high fall risk; Body structures that will need to be addressed: weakness, numbness/tingling, impaired balance, difficulty walking, decreased transfer ability, etc; Due to co-morbidities her clinical presenstation is evolving;    PT Frequency 2x / week   PT Duration 8 weeks   PT Treatment/Interventions Aquatic Therapy;Cryotherapy;Electrical Stimulation;Moist Heat;Balance training;Therapeutic exercise;Therapeutic activities;Functional mobility training;Stair training;Gait training;DME Instruction;Neuromuscular re-education;Patient/family education;Energy conservation   PT Next Visit Plan initiate HEP   PT Home Exercise Plan will address next visit   Consulted and Agree with Plan of Care Patient        Problem List Patient Active Problem List   Diagnosis Date Noted  . S/P shoulder replacement 07/02/2013    Brentley Landfair PT, DPT 04/04/2015, 11:08 AM  [place] [facility] SERVICES [place], [name], [address] Phone: [phone]   Fax:  [phone]  Name: Khaleelah Steen MRN: [id number] Date of Birth: 1945-05-01

## 2015-04-06 ENCOUNTER — Encounter: Payer: Commercial Managed Care - HMO | Admitting: Physical Therapy

## 2015-04-06 ENCOUNTER — Ambulatory Visit: Payer: Commercial Managed Care - HMO | Admitting: Physical Therapy

## 2015-04-06 DIAGNOSIS — R269 Unspecified abnormalities of gait and mobility: Secondary | ICD-10-CM | POA: Diagnosis not present

## 2015-04-06 DIAGNOSIS — Z9181 History of falling: Secondary | ICD-10-CM | POA: Diagnosis not present

## 2015-04-06 DIAGNOSIS — M6281 Muscle weakness (generalized): Secondary | ICD-10-CM

## 2015-04-06 DIAGNOSIS — R2689 Other abnormalities of gait and mobility: Secondary | ICD-10-CM | POA: Diagnosis not present

## 2015-04-07 ENCOUNTER — Encounter: Payer: Self-pay | Admitting: Physical Therapy

## 2015-04-07 NOTE — Therapy (Addendum)
[place] [facility] SERVICES [address] [place], [name], [address] Phone: [phone]   Fax:  [phone]  Physical Therapy Treatment  Patient Details  Name: Hannah Powell MRN: [id number] Date of Birth: 02-Mar-1946 Referring Provider:  Dr. Jennings Books  Encounter Date: 04/06/2015      PT End of Session - 04/07/15 1218    Visit Number 5   Number of Visits 17   Date for PT Re-Evaluation 05/18/15   Authorization Type 5   Authorization Time Period 10   PT Start Time 1102   PT Stop Time 1146   PT Time Calculation (min) 44 min   Equipment Utilized During Treatment Gait belt   Activity Tolerance No increased pain;Patient limited by fatigue   Behavior During Therapy [facility] for tasks assessed/performed      Past Medical History  Diagnosis Date  . CHF (congestive heart failure) ([place])   . Shortness of breath   . Hypertension     dr Otho Najjar     . Hypothyroidism   . Anxiety   . Arthritis   . RLS (restless legs syndrome)   . Sleep apnea     cpap      >5 yrs    Past Surgical History  Procedure Laterality Date  . Back surgery      cervical  . Joint replacement      x2 tkr  . Foot arthroplasty    . Total shoulder arthroplasty Left 07/02/2013    Procedure: LEFT TOTAL SHOULDER ARTHROPLASTY;  Surgeon: Marin Shutter, MD;  Location: [place];  Service: Orthopedics;  Laterality: Left;    There were no vitals filed for this visit.  Visit Diagnosis:  Abnormality of gait  Generalized muscle weakness  Imbalance  At risk for falls      Subjective Assessment - 04/06/15 1110    Subjective Patient reports doing well today; She denies any soreness in left knee; She reports doing well with using just one crutch at home   Pertinent History personal factors affecting rehab: recent left patella fracture (restrictions of no squats/lunges or anything that would put extra pressure on knee); 5 steps to enter house ( B Rails); CHF (controlled but  has shortness of breath with activity), High fall risk, obesity   How long can you sit comfortably? unlimited   How long can you stand comfortably? 3-4 hours   How long can you walk comfortably? 30 + minutes   Diagnostic tests last x-rays of left knee where in November 2016 which looked good;    Patient Stated Goals "get rid of crutches and cane and be able to walk unassisted"; its been 3 years since she walked unassisted;    Currently in Pain? No/denies      TREATMENT: Warm up on Treadmill 1.0 mph with 2-1 rail assist, x4 min with cues to increase step length for increased reciprocal gait pattern, increase erect posture and to improve arm swing with less rail assist;  PT instructed patient in resisted weighted gait with 12.5# forward/backward x1 rep; Patient became dizzy and requested to sit down; BP was 113/67, HR 102, SPO2 95%; She reports having increased sinus congestion and believes that her ears may have fluid in them; PT instructed patient in lying exercise to reduce dizziness/discomfort;  Hooklying: SLR flexion x15 bilaterally; Hip flexion march with green tband x15 bilaterally;  Bridges with BLE x15 (requires wedge under LLE foot due to decreased PF ROM; Bridge with single leg knee  extension x5 bilaterally; patient had increaesd difficulty with RLE due to weakness in hip;  Sidelying: Green tband clamshells 2x15 bilaterally with min VCs for correct positioning to improve hip strengthening;   PT instructed patient in gait with SPC in left hand, 100 feet, CGA with min VCs to improve step length/cadence. Patient continues to have short stance time on right with decreased left step length;   Patient required min-moderate verbal/tactile cues for correct exercise technique.                          PT Education - 04/07/15 1217    Education provided Yes   Education Details LE strengthening   Person(s) Educated Patient   Methods Explanation;Verbal cues    Comprehension Verbalized understanding;Returned demonstration;Verbal cues required             PT Long Term Goals - 03/23/15 1141    PT LONG TERM GOAL #1   Title Patient will be independent in home exercise program to improve strength/mobility for better functional independence with ADLs. by 05/18/15   Time 8   Period Weeks   Status New   PT LONG TERM GOAL #2   Title Patient (> 69 years old) will complete five times sit to stand test with 1 UE or less in < 15 seconds indicating an increased LE strength and improved balance. by 05/18/15   Time 8   Period Weeks   Status New   PT LONG TERM GOAL #3   Title Patient will increase six minute walk test distance to >1000 for progression to community ambulator and improve gait ability by 05/18/15   Time 8   Period Weeks   Status New   PT LONG TERM GOAL #4   Title Patient will increase 10 meter walk test to >1.37m/s as to improve gait speed for better community ambulation and to reduce fall risk. by 05/18/15   Time 8   Period Weeks   Status New   PT LONG TERM GOAL #5   Title Patient will be able to ambulate at least 50 feet without AD demonstrating good posture and safety, independently for increased home ambulation by 05/18/15   Time 8   Period Weeks   Status New   PT LONG TERM GOAL #6   Title Patient will increase Berg Balance score to >42/56 to demonstrate decreased fall risk during functional activities.   Time 8   Period Weeks   Status New               Plan - 04/07/15 1218    Clinical Impression Statement Patient became dizzy while doing resisted weighted gait. She reports having increased head congestion and believes that dizziness is related to fluid in ears. PT instructed patient in BLE strengthening in hooklying, sidelying to reduce discomfort with standing/walking. She continues to have weakness with LE movement. Patient also instructed in gait with SPC requiring CGA for gait safety; She reports feeling fearful when walking  without crutches. Patient would benefit from additional skilled PT intervention to improve balance/gait safety and LE strength.    Pt will benefit from skilled therapeutic intervention in order to improve on the following deficits Abnormal gait;Decreased endurance;Impaired sensation;Obesity;Cardiopulmonary status limiting activity;Decreased activity tolerance;Decreased strength;Difficulty walking;Decreased mobility;Decreased balance;Impaired flexibility;Decreased safety awareness   Rehab Potential Good   Clinical Impairments Affecting Rehab Potential positive: motivation, family support, negative: co-morbidities, deconditioned, high fall risk; Body structures that will need to be addressed: weakness, numbness/tingling,  impaired balance, difficulty walking, decreased transfer ability, etc; Due to co-morbidities her clinical presenstation is evolving;    PT Frequency 2x / week   PT Duration 8 weeks   PT Treatment/Interventions Aquatic Therapy;Cryotherapy;Electrical Stimulation;Moist Heat;Balance training;Therapeutic exercise;Therapeutic activities;Functional mobility training;Stair training;Gait training;DME Instruction;Neuromuscular re-education;Patient/family education;Energy conservation   PT Next Visit Plan advance HEP with more balance exercise   PT Home Exercise Plan continue as previously given;    Consulted and Agree with Plan of Care Patient         Problem List Patient Active Problem List   Diagnosis Date Noted  . S/P shoulder replacement 07/02/2013    Trotter,Margaret PT, DPT 04/07/2015, 12:20 PM  [place] [facility] SERVICES [address] [place], [name], [address] Phone: [phone]   Fax:  [phone]  Name: Adelynne Sime MRN: [id number] Date of Birth: 07-11-1945

## 2015-04-11 ENCOUNTER — Encounter: Payer: Commercial Managed Care - HMO | Admitting: Physical Therapy

## 2015-04-13 ENCOUNTER — Encounter: Payer: Self-pay | Admitting: Physical Therapy

## 2015-04-13 ENCOUNTER — Ambulatory Visit: Payer: Commercial Managed Care - HMO | Admitting: Physical Therapy

## 2015-04-13 DIAGNOSIS — Z9181 History of falling: Secondary | ICD-10-CM | POA: Diagnosis not present

## 2015-04-13 DIAGNOSIS — R269 Unspecified abnormalities of gait and mobility: Secondary | ICD-10-CM

## 2015-04-13 DIAGNOSIS — M6281 Muscle weakness (generalized): Secondary | ICD-10-CM

## 2015-04-13 DIAGNOSIS — R2689 Other abnormalities of gait and mobility: Secondary | ICD-10-CM | POA: Diagnosis not present

## 2015-04-13 NOTE — Patient Instructions (Addendum)
March    Lift knee toward chest to 90 bend, then lower leg as knee is straightened. Session: March _1-2___ minutes. Do _5___ sessions per week. Arm movement: Swing, elbows straight (UEP-1) Breaststroke (UEP-3) Overhand crawl (UEP-4) Move: Forward  Try not to hold on if possible. Use crutch as needed;  Copyright  VHI. All rights reserved.  Forward    Step up onto step using 1 rail and then step down with both feet, Do _10___ repetitions, __1-2__ sets, leading with each foot;   http://bt.exer.us/155   Copyright  VHI. All rights reserved.

## 2015-04-13 NOTE — Therapy (Signed)
[place] MAIN [facility] SERVICES [address] [place], [name], [address] Phone: [phone]   Fax:  [phone]  Physical Therapy Treatment  Patient Details  Name: Hannah Powell MRN: [id number] Date of Birth: September 27, 1945 Referring Provider:  Dr. Jennings Books  Encounter Date: 04/13/2015      PT End of Session - 04/13/15 1301    Visit Number 6   Number of Visits 17   Date for PT Re-Evaluation 05/18/15   Authorization Type 6   Authorization Time Period 10   PT Start Time 1101   PT Stop Time 1159   PT Time Calculation (min) 58 min   Equipment Utilized During Treatment Gait belt   Activity Tolerance No increased pain;Patient limited by fatigue   Behavior During Therapy [facility] for tasks assessed/performed      Past Medical History  Diagnosis Date  . CHF (congestive heart failure) ([place])   . Shortness of breath   . Hypertension     dr Otho Najjar     . Hypothyroidism   . Anxiety   . Arthritis   . RLS (restless legs syndrome)   . Sleep apnea     cpap      >5 yrs    Past Surgical History  Procedure Laterality Date  . Back surgery      cervical  . Joint replacement      x2 tkr  . Foot arthroplasty    . Total shoulder arthroplasty Left 07/02/2013    Procedure: LEFT TOTAL SHOULDER ARTHROPLASTY;  Surgeon: Marin Shutter, MD;  Location: [place];  Service: Orthopedics;  Laterality: Left;    There were no vitals filed for this visit.  Visit Diagnosis:  Abnormality of gait  Imbalance  At risk for falls  Generalized muscle weakness      Subjective Assessment - 04/13/15 1105    Subjective Patient reports doing well today; Denies any pain; Reports compliance with HEP    Pertinent History personal factors affecting rehab: recent left patella fracture (restrictions of no squats/lunges or anything that would put extra pressure on knee); 5 steps to enter house ( B Rails); CHF (controlled but has shortness of breath with activity), High fall  risk, obesity   How long can you sit comfortably? unlimited   How long can you stand comfortably? 3-4 hours   How long can you walk comfortably? 30 + minutes   Diagnostic tests last x-rays of left knee where in November 2016 which looked good;    Patient Stated Goals "get rid of crutches and cane and be able to walk unassisted"; its been 3 years since she walked unassisted;    Currently in Pain? No/denies      TREATMENT:   Warm up on Treadmill 1.0 mph with 2-1 rail assist x3 min with mod VCS to increase RLE stance and slow down LLE Step for better even cadence;   Resisted weighted gait with LUE crutch, 7.5# forward/backward x2 laps, side/side left to right x2 laps with min A and mod VCs for correct gait technique including to increase step length and improve weight shift for better balance;   PT educated patient in HEP: Alternate march with LUE crutch x2 min with min VCs to increase hip flexion for better hip strengthening; Forward step up on 4 inch step with 1 rail assist and other UE fingertip hold x10 each LE; Patient required min Vcs to increase erect posture and increase forward weight shift; Standing feet shoulder width apart,  right LE hip hike x10 with mod VCs to increase LLE glute squeeze, increase hip elevation for better left hip abductor strengthening; Pal-off press x10 each side with red tband; patient requires min VCs for correct positioning with set up; She reports feeling increased gluteal soreness with increased repetition;  PT provided written handout for increased compliance; Educated patient on plan of care; She expressed desire to decrease to 1x a week due to financial costs.                        PT Education - 04/13/15 1301    Education provided Yes   Education Details LE strengthening, HEP   Person(s) Educated Patient   Methods Explanation;Verbal cues;Handout;Tactile cues   Comprehension Verbalized understanding;Returned demonstration;Verbal  cues required;Tactile cues required             PT Long Term Goals - 03/23/15 1141    PT LONG TERM GOAL #1   Title Patient will be independent in home exercise program to improve strength/mobility for better functional independence with ADLs. by 05/18/15   Time 8   Period Weeks   Status New   PT LONG TERM GOAL #2   Title Patient (> 27 years old) will complete five times sit to stand test with 1 UE or less in < 15 seconds indicating an increased LE strength and improved balance. by 05/18/15   Time 8   Period Weeks   Status New   PT LONG TERM GOAL #3   Title Patient will increase six minute walk test distance to >1000 for progression to community ambulator and improve gait ability by 05/18/15   Time 8   Period Weeks   Status New   PT LONG TERM GOAL #4   Title Patient will increase 10 meter walk test to >1.12m/s as to improve gait speed for better community ambulation and to reduce fall risk. by 05/18/15   Time 8   Period Weeks   Status New   PT LONG TERM GOAL #5   Title Patient will be able to ambulate at least 50 feet without AD demonstrating good posture and safety, independently for increased home ambulation by 05/18/15   Time 8   Period Weeks   Status New   PT LONG TERM GOAL #6   Title Patient will increase Berg Balance score to >42/56 to demonstrate decreased fall risk during functional activities.   Time 8   Period Weeks   Status New               Plan - 04/13/15 1302    Clinical Impression Statement Patient reports less dizziness today; She presents to therapy with B axillary crutches but was able to decrease to 1 crutch well without difficulty. PT emphasized importance of trying to use just 1 crutch at home for improved gait ability; Patient instructed in BLE strengthening, gait tasks. She requires min VCs for correct exercise technique. Patient would benefit from additional skilled PT intervention to improve LE strength and gait independence. She did express financial  concerns for continuing 2 x a week and has requested to be decreased to 1x a week;    Pt will benefit from skilled therapeutic intervention in order to improve on the following deficits Abnormal gait;Decreased endurance;Impaired sensation;Obesity;Cardiopulmonary status limiting activity;Decreased activity tolerance;Decreased strength;Difficulty walking;Decreased mobility;Decreased balance;Impaired flexibility;Decreased safety awareness   Rehab Potential Good   Clinical Impairments Affecting Rehab Potential positive: motivation, family support, negative: co-morbidities, deconditioned, high fall risk;  Body structures that will need to be addressed: weakness, numbness/tingling, impaired balance, difficulty walking, decreased transfer ability, etc; Due to co-morbidities her clinical presenstation is evolving;    PT Frequency 1x / week   PT Duration 8 weeks   PT Treatment/Interventions Aquatic Therapy;Cryotherapy;Electrical Stimulation;Moist Heat;Balance training;Therapeutic exercise;Therapeutic activities;Functional mobility training;Stair training;Gait training;DME Instruction;Neuromuscular re-education;Patient/family education;Energy conservation   PT Next Visit Plan work on gait training, LE strengthening, check goals?   PT Home Exercise Plan advanced- see patient instructions   Consulted and Agree with Plan of Care Patient        Problem List Patient Active Problem List   Diagnosis Date Noted  . S/P shoulder replacement 07/02/2013    Trotter,Margaret PT, DPT 04/13/2015, 2:22 PM  [place] MAIN [facility] SERVICES [address] [place], [name], [address] Phone: [phone]   Fax:  [phone]  Name: Hannah Powell MRN: [id number] Date of Birth: 07/15/1945

## 2015-04-15 ENCOUNTER — Encounter: Payer: Commercial Managed Care - HMO | Admitting: Physical Therapy

## 2015-04-18 ENCOUNTER — Ambulatory Visit: Payer: Commercial Managed Care - HMO | Admitting: Physical Therapy

## 2015-04-20 ENCOUNTER — Ambulatory Visit: Payer: Commercial Managed Care - HMO | Admitting: Physical Therapy

## 2015-04-22 ENCOUNTER — Ambulatory Visit: Payer: Commercial Managed Care - HMO | Attending: Neurology | Admitting: Physical Therapy

## 2015-04-22 ENCOUNTER — Encounter: Payer: Self-pay | Admitting: Physical Therapy

## 2015-04-22 DIAGNOSIS — R2689 Other abnormalities of gait and mobility: Secondary | ICD-10-CM

## 2015-04-22 DIAGNOSIS — M6281 Muscle weakness (generalized): Secondary | ICD-10-CM | POA: Insufficient documentation

## 2015-04-22 DIAGNOSIS — R269 Unspecified abnormalities of gait and mobility: Secondary | ICD-10-CM | POA: Insufficient documentation

## 2015-04-22 DIAGNOSIS — Z9181 History of falling: Secondary | ICD-10-CM | POA: Insufficient documentation

## 2015-04-22 NOTE — Therapy (Signed)
[place] MAIN [facility] SERVICES [address] [place], [name], [address] Phone: [phone]   Fax:  [phone]  Physical Therapy Treatment/Progress note 03/23/15 to 04/22/15  Patient Details  Name: Hannah Powell MRN: [id number] Date of Birth: Jan 07, 1946 (70) Referring Provider:  Dr. Jennings Books  Encounter Date: 04/22/2015      PT End of Session - 04/22/15 1126    Visit Number 7   Number of Visits 17   Date for PT Re-Evaluation 05/18/15   Authorization Type 1   Authorization Time Period 10   PT Start Time 1016   PT Stop Time 1117   PT Time Calculation (min) 61 min   Equipment Utilized During Treatment Gait belt   Activity Tolerance No increased pain;Patient limited by fatigue   Behavior During Therapy [facility] for tasks assessed/performed      Past Medical History  Diagnosis Date  . CHF (congestive heart failure) ([place])   . Shortness of breath   . Hypertension     dr Otho Najjar     . Hypothyroidism   . Anxiety   . Arthritis   . RLS (restless legs syndrome)   . Sleep apnea     cpap      >5 yrs    Past Surgical History  Procedure Laterality Date  . Back surgery      cervical  . Joint replacement      x2 tkr  . Foot arthroplasty    . Total shoulder arthroplasty Left 07/02/2013    Procedure: LEFT TOTAL SHOULDER ARTHROPLASTY;  Surgeon: Marin Shutter, MD;  Location: [place];  Service: Orthopedics;  Laterality: Left;    There were no vitals filed for this visit.  Visit Diagnosis:  Abnormality of gait  Imbalance  At risk for falls  Generalized muscle weakness      Subjective Assessment - 04/22/15 1027    Subjective "I have been doing the exercises and working with a Physiological scientist. I think that things are getting better" Patient presents to therapy with B axillary crutches;    Pertinent History personal factors affecting rehab: recent left patella fracture (restrictions of no squats/lunges or anything that would put extra pressure  on knee); 5 steps to enter house ( B Rails); CHF (controlled but has shortness of breath with activity), High fall risk, obesity   How long can you sit comfortably? unlimited   How long can you stand comfortably? 3-4 hours   How long can you walk comfortably? 30 + minutes   Diagnostic tests last x-rays of left knee where in November 2016 which looked good;    Patient Stated Goals "get rid of crutches and cane and be able to walk unassisted"; its been 3 years since she walked unassisted;    Currently in Pain? No/denies            [facility] PT Assessment - 04/22/15 0001    Observation/Other Assessments   Lower Extremity Functional Scale  36/80 (the lower the score the greater the disability)   Strength   Right Hip Flexion 4+/5   Right Hip ABduction 4/5   Right Hip ADduction 4/5   Left Hip Flexion 4+/5   Left Hip ABduction 4/5   Left Hip ADduction 4/5   Right Knee Flexion 4+/5   Right Knee Extension 4+/5   Left Knee Flexion 4+/5   Left Knee Extension 4/5   Right Ankle Dorsiflexion 4/5   Left Ankle Dorsiflexion 4/5   6 minute walk test  results    Aerobic Endurance Distance Walked 410   Endurance additional comments with LUE axillary crutch; required rest break after 4 min 15 sec after walk 380 feet; no significant difference from initial eval on 03/23/15 which was 490 feet with B axillary crutches;   Standardized Balance Assessment   Five times sit to stand comments  13.5 sec with 1 UE; improved from initial eval on 03/23/15 which was 16 sec with BUE pushing on chair rails;    10 Meter Walk 0.60 m/s with 1 UE crutch; limited home ambulator; improved from initial eval on 03/23/15 which was0.54 m/s with BUE axillary crutches;         TREATMENT: Warm up on Nustep BUE/BLE level 3 x4 min (unbilled);  PT educated patient in correct hand placement for stepping up on treadmill for optimum ability to get on/off treadmill.  Instructed patient in Outcome measures (10 meter walk, 5 times  sit<>Stand, 6 min walk and LEFS) to assess progress towards goals; Please see above; While patient's time and gait distance didn't significantly improve, she was able to complete all tests with 1 crutch as compared to 2 crutches;  Leg press: BLE plate 75# [id number] LLE only plate 45# [id number] Patient required min-moderate verbal/tactile cues for correct exercise technique including to increase knee ROM for better strengthening;  Patient ambulated in parallel bars without rail assist x8 feet x2 with close supervision demonstrating short step length but able to complete without HHA; Patient reports that this is her first time walking without assistive device in 3 years;                       PT Education - 04/22/15 1126    Education provided Yes   Education Details outcomes, strengthening, gait safety;    Person(s) Educated Patient   Methods Explanation;Verbal cues   Comprehension Verbalized understanding;Returned demonstration;Verbal cues required             PT Long Term Goals - 04/22/15 1040    PT LONG TERM GOAL #1   Title Patient will be independent in home exercise program to improve strength/mobility for better functional independence with ADLs. by 05/18/15   Time 8   Period Weeks   Status On-going   PT LONG TERM GOAL #2   Title Patient (> 13 years old) will complete five times sit to stand test with no hands  in < 15 seconds indicating an increased LE strength and improved balance. by 05/18/15   Time 8   Period Weeks   Status Revised   PT LONG TERM GOAL #3   Title Patient will increase six minute walk test distance to >1000 for progression to community ambulator and improve gait ability by 05/18/15   Time 8   Period Weeks   Status Not Met   PT LONG TERM GOAL #4   Title Patient will increase 10 meter walk test to >1.15ms as to improve gait speed for better community ambulation and to reduce fall risk. by 05/18/15   Time 8   Period Weeks   Status Partially Met   PT  LONG TERM GOAL #5   Title Patient will be able to ambulate at least 50 feet without AD demonstrating good posture and safety, independently for increased home ambulation by 05/18/15   Time 8   Period Weeks   Status Not Met   PT LONG TERM GOAL #6   Title Patient will increase Berg Balance score to >42/56 to  demonstrate decreased fall risk during functional activities.   Time 8   Period Weeks   Status Not Met               Plan - 05/22/15 1126    Clinical Impression Statement Instructed patient in functional outcomes to assess progress towards goals. she was able to demonstrate improved gait ability with using just 1 crutch but her distance and times were not significantly faster. Patient also demonstrates improved LE strength. She was instructed in Leg press strengthening without difficulty. She would benefit from additional skilled PT intervention to improve balance/gait safety and increase functional mobility.    Pt will benefit from skilled therapeutic intervention in order to improve on the following deficits Abnormal gait;Decreased endurance;Impaired sensation;Obesity;Cardiopulmonary status limiting activity;Decreased activity tolerance;Decreased strength;Difficulty walking;Decreased mobility;Decreased balance;Impaired flexibility;Decreased safety awareness   Rehab Potential Good   Clinical Impairments Affecting Rehab Potential positive: motivation, family support, negative: co-morbidities, deconditioned, high fall risk; Body structures that will need to be addressed: weakness, numbness/tingling, impaired balance, difficulty walking, decreased transfer ability, etc; Due to co-morbidities her clinical presenstation is evolving;    PT Frequency 1x / week   PT Duration 8 weeks   PT Treatment/Interventions Aquatic Therapy;Cryotherapy;Electrical Stimulation;Moist Heat;Balance training;Therapeutic exercise;Therapeutic activities;Functional mobility training;Stair training;Gait training;DME  Instruction;Neuromuscular re-education;Patient/family education;Energy conservation   PT Next Visit Plan work on balance exercise;    PT Home Exercise Plan continue as given   Consulted and Agree with Plan of Care Patient          G-Codes - May 22, 2015 1128    Functional Assessment Tool Used 6 Minute walk test, 5x sit to stand, 68mwalk time,clinical judgement   Functional Limitation Mobility: Walking and moving around   Mobility: Walking and Moving Around Current Status (([id number] At least 40 percent but less than 60 percent impaired, limited or restricted   Mobility: Walking and Moving Around Goal Status [phone] At least 20 percent but less than 40 percent impaired, limited or restricted      Problem List Patient Active Problem List   Diagnosis Date Noted  . S/P shoulder replacement 07/02/2013    Maximilien Hayashi PT, DPT 22017-03-05 11:29 AM  [place] [facility] [address] [address]Phone: [phone]  Fax:  [phone] Name: Hannah BannMRN: [id number] of Birth: 912-Dec-1947

## 2015-04-25 ENCOUNTER — Ambulatory Visit: Payer: Commercial Managed Care - HMO | Admitting: Physical Therapy

## 2015-04-25 DIAGNOSIS — I10 Essential (primary) hypertension: Secondary | ICD-10-CM | POA: Diagnosis not present

## 2015-04-25 DIAGNOSIS — J438 Other emphysema: Secondary | ICD-10-CM | POA: Diagnosis not present

## 2015-04-25 DIAGNOSIS — J111 Influenza due to unidentified influenza virus with other respiratory manifestations: Secondary | ICD-10-CM | POA: Diagnosis not present

## 2015-04-27 ENCOUNTER — Ambulatory Visit: Payer: Commercial Managed Care - HMO | Admitting: Physical Therapy

## 2015-04-28 ENCOUNTER — Ambulatory Visit: Payer: Commercial Managed Care - HMO | Admitting: Physical Therapy

## 2015-05-02 ENCOUNTER — Encounter: Payer: Self-pay | Admitting: Physical Therapy

## 2015-05-04 ENCOUNTER — Encounter: Payer: Self-pay | Admitting: Physical Therapy

## 2015-05-04 ENCOUNTER — Ambulatory Visit: Payer: Commercial Managed Care - HMO | Admitting: Physical Therapy

## 2015-05-04 DIAGNOSIS — Z9181 History of falling: Secondary | ICD-10-CM | POA: Diagnosis not present

## 2015-05-04 DIAGNOSIS — R269 Unspecified abnormalities of gait and mobility: Secondary | ICD-10-CM | POA: Diagnosis not present

## 2015-05-04 DIAGNOSIS — R2689 Other abnormalities of gait and mobility: Secondary | ICD-10-CM | POA: Diagnosis not present

## 2015-05-04 DIAGNOSIS — M6281 Muscle weakness (generalized): Secondary | ICD-10-CM | POA: Diagnosis not present

## 2015-05-04 NOTE — Therapy (Signed)
[place] MAIN [facility] SERVICES [address] [place], [name], [address] Phone: [phone]   Fax:  [phone]  Physical Therapy Treatment  Patient Details  Name: Hannah Powell MRN: [id number] Date of Birth: 08/08/1945 Referring Provider:  Dr. Jennings Books  Encounter Date: 05/04/2015      PT End of Session - 05/04/15 1131    Visit Number 8   Number of Visits 17   Date for PT Re-Evaluation 05/18/15   Authorization Type 2   Authorization Time Period 10   PT Start Time 1033   PT Stop Time 1116   PT Time Calculation (min) 43 min   Equipment Utilized During Treatment Gait belt   Activity Tolerance No increased pain;Patient limited by fatigue   Behavior During Therapy [facility] for tasks assessed/performed      Past Medical History  Diagnosis Date  . CHF (congestive heart failure) ([place])   . Shortness of breath   . Hypertension     dr Otho Najjar     . Hypothyroidism   . Anxiety   . Arthritis   . RLS (restless legs syndrome)   . Sleep apnea     cpap      >5 yrs    Past Surgical History  Procedure Laterality Date  . Back surgery      cervical  . Joint replacement      x2 tkr  . Foot arthroplasty    . Total shoulder arthroplasty Left 07/02/2013    Procedure: LEFT TOTAL SHOULDER ARTHROPLASTY;  Surgeon: Marin Shutter, MD;  Location: [place];  Service: Orthopedics;  Laterality: Left;    There were no vitals filed for this visit.  Visit Diagnosis:  Abnormality of gait  Imbalance  At risk for falls  Generalized muscle weakness      Subjective Assessment - 05/04/15 1045    Subjective Patient has been sick with the flu; She reports that she is about 85% from PLOF; She presents to therapy with 1 crutch and reports that she has done well with just the 1 crutch;    Pertinent History personal factors affecting rehab: recent left patella fracture (restrictions of no squats/lunges or anything that would put extra pressure on knee); 5  steps to enter house ( B Rails); CHF (controlled but has shortness of breath with activity), High fall risk, obesity   How long can you sit comfortably? unlimited   How long can you stand comfortably? 3-4 hours   How long can you walk comfortably? 30 + minutes   Diagnostic tests last x-rays of left knee where in November 2016 which looked good;    Patient Stated Goals "get rid of crutches and cane and be able to walk unassisted"; its been 3 years since she walked unassisted;    Currently in Pain? No/denies        TREATMENT: Warm up on NuStep level 2 BUE/BLE x5 min (unbilled):  Resisted weighted gait 12.5# forward/backward, side/side x2 laps each with min VCs for improved weight shift to improve balance control;  Sit<>stand from raised mat table without HHA x5 reps with mod VCS to increase forward weight shift for improved transfer ability;  Side stepping in parallel bars with green tband 10 feet x3 laps each direction; Diagonal steps forward/backward with parallel bars green tband 10 feet x2 laps each direction; Hip abduction green tband x12 bilaterally; Hip extension green tband x12 bilaterally; required min VCs to reduce BUE weight bearing for better hip strengthening;  Forward/backward step outside parallel bars without rail assist x10 reps each with mod VCs to increase step length for better gait ability;  Patient required min-moderate verbal/tactile cues for correct exercise technique.                           PT Education - 05/04/15 1131    Education provided Yes   Education Details LE strengthening, HEP   Person(s) Educated Patient   Methods Explanation;Verbal cues   Comprehension Verbalized understanding;Returned demonstration;Verbal cues required             PT Long Term Goals - 04/22/15 1040    PT LONG TERM GOAL #1   Title Patient will be independent in home exercise program to improve strength/mobility for better functional independence  with ADLs. by 05/18/15   Time 8   Period Weeks   Status On-going   PT LONG TERM GOAL #2   Title Patient (> 9 years old) will complete five times sit to stand test with no hands  in < 15 seconds indicating an increased LE strength and improved balance. by 05/18/15   Time 8   Period Weeks   Status Revised   PT LONG TERM GOAL #3   Title Patient will increase six minute walk test distance to >1000 for progression to community ambulator and improve gait ability by 05/18/15   Time 8   Period Weeks   Status Not Met   PT LONG TERM GOAL #4   Title Patient will increase 10 meter walk test to >1.18ms as to improve gait speed for better community ambulation and to reduce fall risk. by 05/18/15   Time 8   Period Weeks   Status Partially Met   PT LONG TERM GOAL #5   Title Patient will be able to ambulate at least 50 feet without AD demonstrating good posture and safety, independently for increased home ambulation by 05/18/15   Time 8   Period Weeks   Status Not Met   PT LONG TERM GOAL #6   Title Patient will increase Berg Balance score to >42/56 to demonstrate decreased fall risk during functional activities.   Time 8   Period Weeks   Status Not Met               Plan - 05/04/15 1132    Clinical Impression Statement Instructed patient in BLE strengthening. Patient able to tolerate increased resistance. She still has difficulty with sit<>Stand without HHA. Patient able to ambulate short distances <10 feet without AD with increased lateral trunk sway and trendelenburg gait. She would benefit from additional skilled PT intervention to improve balance and gait safety as well as LE strength.    Pt will benefit from skilled therapeutic intervention in order to improve on the following deficits Abnormal gait;Decreased endurance;Impaired sensation;Obesity;Cardiopulmonary status limiting activity;Decreased activity tolerance;Decreased strength;Difficulty walking;Decreased mobility;Decreased balance;Impaired  flexibility;Decreased safety awareness   Rehab Potential Good   Clinical Impairments Affecting Rehab Potential positive: motivation, family support, negative: co-morbidities, deconditioned, high fall risk; Body structures that will need to be addressed: weakness, numbness/tingling, impaired balance, difficulty walking, decreased transfer ability, etc; Due to co-morbidities her clinical presenstation is evolving;    PT Frequency 1x / week   PT Duration 8 weeks   PT Treatment/Interventions Aquatic Therapy;Cryotherapy;Electrical Stimulation;Moist Heat;Balance training;Therapeutic exercise;Therapeutic activities;Functional mobility training;Stair training;Gait training;DME Instruction;Neuromuscular re-education;Patient/family education;Energy conservation   PT Next Visit Plan work on balance exercise;    PT Home Exercise Plan  advanced with green tband   Consulted and Agree with Plan of Care Patient        Problem List Patient Active Problem List   Diagnosis Date Noted  . S/P shoulder replacement 07/02/2013    Trotter,Margaret PT, DPT 05/04/2015, 11:48 AM  [place] MAIN [facility] SERVICES [place], [name], [address] Phone: [phone]   Fax:  [phone]  Name: Hannah Powell MRN: [id number] Date of Birth: Jul 26, 1945

## 2015-05-09 ENCOUNTER — Encounter: Payer: Self-pay | Admitting: Physical Therapy

## 2015-05-11 ENCOUNTER — Ambulatory Visit: Payer: Commercial Managed Care - HMO | Admitting: Physical Therapy

## 2015-05-11 ENCOUNTER — Encounter: Payer: Self-pay | Admitting: Physical Therapy

## 2015-05-16 ENCOUNTER — Encounter: Payer: Self-pay | Admitting: Physical Therapy

## 2015-05-18 ENCOUNTER — Encounter: Payer: Self-pay | Admitting: Physical Therapy

## 2015-05-18 ENCOUNTER — Ambulatory Visit: Payer: Commercial Managed Care - HMO | Attending: Neurology | Admitting: Physical Therapy

## 2015-05-18 DIAGNOSIS — Z9181 History of falling: Secondary | ICD-10-CM

## 2015-05-18 DIAGNOSIS — R2689 Other abnormalities of gait and mobility: Secondary | ICD-10-CM

## 2015-05-18 DIAGNOSIS — M6281 Muscle weakness (generalized): Secondary | ICD-10-CM | POA: Diagnosis not present

## 2015-05-18 DIAGNOSIS — R269 Unspecified abnormalities of gait and mobility: Secondary | ICD-10-CM | POA: Diagnosis not present

## 2015-05-18 NOTE — Therapy (Signed)
[place] MAIN [facility] SERVICES [address] [place], [name], [address] Phone: [phone]   Fax:  [phone]  Physical Therapy Treatment  Patient Details  Name: Hannah Powell MRN: [id number] Date of Birth: 1945/07/08 Referring Provider:  Dr. Jennings Books  Encounter Date: 05/18/2015      PT End of Session - 05/18/15 1421    Visit Number 9   Number of Visits 17   Date for PT Re-Evaluation 05/18/15   Authorization Type 3   Authorization Time Period 10   PT Start Time 1025   PT Stop Time 1125   PT Time Calculation (min) 60 min   Equipment Utilized During Treatment Gait belt   Activity Tolerance No increased pain;Patient limited by fatigue   Behavior During Therapy [facility] for tasks assessed/performed      Past Medical History  Diagnosis Date  . CHF (congestive heart failure) ([place])   . Shortness of breath   . Hypertension     dr Otho Najjar     . Hypothyroidism   . Anxiety   . Arthritis   . RLS (restless legs syndrome)   . Sleep apnea     cpap      >5 yrs    Past Surgical History  Procedure Laterality Date  . Back surgery      cervical  . Joint replacement      x2 tkr  . Foot arthroplasty    . Total shoulder arthroplasty Left 07/02/2013    Procedure: LEFT TOTAL SHOULDER ARTHROPLASTY;  Surgeon: Marin Shutter, MD;  Location: [facility];  Service: Orthopedics;  Laterality: Left;    There were no vitals filed for this visit.  Visit Diagnosis:  Abnormality of gait  Imbalance  At risk for falls  Generalized muscle weakness      Subjective Assessment - 05/18/15 1030    Subjective Patient reports having a good trip. She reports walking 2.5 miles in the last week one day. She presents to therapy with 1 crutch. Reports that her exercises are going well.    Pertinent History personal factors affecting rehab: recent left patella fracture (restrictions of no squats/lunges or anything that would put extra pressure on knee); 5 steps to  enter house ( B Rails); CHF (controlled but has shortness of breath with activity), High fall risk, obesity   How long can you sit comfortably? unlimited   How long can you stand comfortably? 3-4 hours   How long can you walk comfortably? 30 + minutes   Diagnostic tests last x-rays of left knee where in November 2016 which looked good;    Patient Stated Goals "get rid of crutches and cane and be able to walk unassisted"; its been 3 years since she walked unassisted;    Currently in Pain? Yes   Pain Score 3    Pain Location Knee   Pain Orientation Right;Left   Pain Descriptors / Indicators Aching;Sore   Pain Type Chronic pain          TREATMENT: Warm up on NuStep level 2 BLE x5 min (unbilled):  Resisted weighted gait 12.5# forward/backward, side/side x2 laps each with min VCs for improved weight shift to improve balance control; Patient requires 1 crutch in LUE during resisted weighted gait for balance;   Gait with SPC in LUE x100 feet with CGA to close supervision and mod VCS to increase step length, increase erect posture. Patient ambulates with narrow base of support, and trendelenburg right with left hip  drop; Gait with SPC in LUE, weaving around cones #6, picking up cones # 6 with CGA to close supervision; Patient able to demonstrate better step length with 2nd gait trial. She is slower with negotiating cones but was able to turn well.  PT instructed patient to use cane with all gait inside home and to only use single crutch when outside home. Gait without AD x15 feet CGA for balance with increased trendelenburg gait, slower gait speed, decreased step length on left; Patient very fearful with walking without AD and would benefit from additional skilled PT intervention for better gait training; Patient continues to be concerned with trendelenburg gait. PT instructed patient to really focus on sidelying hip abduction and standing hip abduction working on maintain neutral hips during  exercise;  Standing in corner on airex pad: Head turns up/down x10 reps, side/side x10 reps; patient unsupported and required min VCs to increase gaze stabilization for better balance control; Standing unsupported, feet close together, eyes open/closed 10 sec hold x4 each;  Patient required close supervision with all balance exercise. Educated patient in ways to improve safety at home such as putting chair in front and to stand close to corner for better support when eyes are closed. PT advanced HEP- see patient instructions;                          PT Education - 05/18/15 1421    Education provided Yes   Education Details balance exercise   Person(s) Educated Patient   Methods Explanation;Verbal cues   Comprehension Verbalized understanding;Returned demonstration;Verbal cues required             PT Long Term Goals - 04/22/15 1040    PT LONG TERM GOAL #1   Title Patient will be independent in home exercise program to improve strength/mobility for better functional independence with ADLs. by 05/18/15   Time 8   Period Weeks   Status On-going   PT LONG TERM GOAL #2   Title Patient (> 68 years old) will complete five times sit to stand test with no hands  in < 15 seconds indicating an increased LE strength and improved balance. by 05/18/15   Time 8   Period Weeks   Status Revised   PT LONG TERM GOAL #3   Title Patient will increase six minute walk test distance to >1000 for progression to community ambulator and improve gait ability by 05/18/15   Time 8   Period Weeks   Status Not Met   PT LONG TERM GOAL #4   Title Patient will increase 10 meter walk test to >1.51ms as to improve gait speed for better community ambulation and to reduce fall risk. by 05/18/15   Time 8   Period Weeks   Status Partially Met   PT LONG TERM GOAL #5   Title Patient will be able to ambulate at least 50 feet without AD demonstrating good posture and safety, independently for increased  home ambulation by 05/18/15   Time 8   Period Weeks   Status Not Met   PT LONG TERM GOAL #6   Title Patient will increase Berg Balance score to >42/56 to demonstrate decreased fall risk during functional activities.   Time 8   Period Weeks   Status Not Met               Plan - 05/18/15 1422    Clinical Impression Statement Instructed patient in gait training and  balance exercise. Patient able to ambulate >100 feet with SPC with CGA to close supervision. She continues to have right hip weakness with trendelenburg gait with less AD. Patient able to demonstrate improved balance with less unsteadiness unsupported. Patient would benefit from additional skilled PT intervention to improve balance and gait safety; Will reassess goals next visit.    Pt will benefit from skilled therapeutic intervention in order to improve on the following deficits Abnormal gait;Decreased endurance;Impaired sensation;Obesity;Cardiopulmonary status limiting activity;Decreased activity tolerance;Decreased strength;Difficulty walking;Decreased mobility;Decreased balance;Impaired flexibility;Decreased safety awareness   Rehab Potential Good   Clinical Impairments Affecting Rehab Potential positive: motivation, family support, negative: co-morbidities, deconditioned, high fall risk; Body structures that will need to be addressed: weakness, numbness/tingling, impaired balance, difficulty walking, decreased transfer ability, etc; Due to co-morbidities her clinical presenstation is evolving;    PT Frequency 1x / week   PT Duration 8 weeks   PT Treatment/Interventions Aquatic Therapy;Cryotherapy;Electrical Stimulation;Moist Heat;Balance training;Therapeutic exercise;Therapeutic activities;Functional mobility training;Stair training;Gait training;DME Instruction;Neuromuscular re-education;Patient/family education;Energy conservation   PT Next Visit Plan assess goals   PT Home Exercise Plan advanced with balance exercise    Consulted and Agree with Plan of Care Patient        Problem List Patient Active Problem List   Diagnosis Date Noted  . S/P shoulder replacement 07/02/2013      Trotter,Margaret PT, DPT 05/18/2015, 2:25 PM  [place] MAIN [facility] SERVICES [address] [place], [name], [address] Phone: [phone]   Fax:  [phone]  Name: Hannah Powell MRN: [id number] Date of Birth: 09-19-1945

## 2015-05-18 NOTE — Patient Instructions (Addendum)
  Feet Together, Head Motion - Eyes Closed    Standing in corner (have a chair in front for safety);  1- start with turning head side/side x10 each direction; 2- turn head up/down x10 each direction; 3- eyes open x10 sec, eyes closed 10 sec x5 each;  Try to have feet close together if possible;

## 2015-05-27 ENCOUNTER — Ambulatory Visit: Payer: Commercial Managed Care - HMO

## 2015-05-27 VITALS — BP 136/61 | HR 93

## 2015-05-27 DIAGNOSIS — R269 Unspecified abnormalities of gait and mobility: Secondary | ICD-10-CM

## 2015-05-27 DIAGNOSIS — M6281 Muscle weakness (generalized): Secondary | ICD-10-CM

## 2015-05-27 DIAGNOSIS — R2689 Other abnormalities of gait and mobility: Secondary | ICD-10-CM

## 2015-05-27 DIAGNOSIS — Z9181 History of falling: Secondary | ICD-10-CM | POA: Diagnosis not present

## 2015-05-27 NOTE — Therapy (Signed)
[place] MAIN [facility] SERVICES [address] [place], [name], [address] Phone: [phone]   Fax:  [phone]  Physical Therapy Treatment  Patient Details  Name: Hannah Powell MRN: [id number] Date of Birth: 05-25-1945 Referring Provider:  Dr. Jennings Books  Encounter Date: 05/27/2015      PT End of Session - 05/27/15 1207    Visit Number 10   Number of Visits 25   Date for PT Re-Evaluation 07/22/15   Authorization Type 1   Authorization Time Period 10   PT Start Time 0905   PT Stop Time 0950   PT Time Calculation (min) 45 min   Equipment Utilized During Treatment Gait belt   Activity Tolerance No increased pain;Patient limited by fatigue   Behavior During Therapy [facility] for tasks assessed/performed      Past Medical History  Diagnosis Date  . CHF (congestive heart failure) ([place])   . Shortness of breath   . Hypertension     dr Otho Najjar     . Hypothyroidism   . Anxiety   . Arthritis   . RLS (restless legs syndrome)   . Sleep apnea     cpap      >5 yrs    Past Surgical History  Procedure Laterality Date  . Back surgery      cervical  . Joint replacement      x2 tkr  . Foot arthroplasty    . Total shoulder arthroplasty Left 07/02/2013    Procedure: LEFT TOTAL SHOULDER ARTHROPLASTY;  Surgeon: Marin Shutter, MD;  Location: [place];  Service: Orthopedics;  Laterality: Left;    Filed Vitals:   05/27/15 0911  BP: 136/61  Pulse: 93  SpO2: 94%    Visit Diagnosis:  Abnormality of gait - Plan: PT plan of care cert/re-cert  Imbalance - Plan: PT plan of care cert/re-cert  Generalized muscle weakness - Plan: PT plan of care cert/re-cert      Subjective Assessment - 05/27/15 0911    Subjective Pt reports she is doing well on this date. She is having a little more L foot pain this morining. She rates pain as 5/10 on this date. Performing HEP without issue. No specific questions or concerns on this date.    Pertinent History  personal factors affecting rehab: recent left patella fracture (restrictions of no squats/lunges or anything that would put extra pressure on knee); 5 steps to enter house ( B [place]); CHF (controlled but has shortness of breath with activity), High fall risk, obesity   How long can you sit comfortably? unlimited   How long can you stand comfortably? 3-4 hours   How long can you walk comfortably? 30 + minutes   Diagnostic tests last x-rays of left knee where in November 2016 which looked good;    Patient Stated Goals "get rid of crutches and cane and be able to walk unassisted"; its been 3 years since she walked unassisted;    Currently in Pain? Yes   Pain Score 5    Pain Location Foot   Pain Orientation Left   Pain Descriptors / Indicators Burning   Pain Type Chronic pain   Multiple Pain Sites No            [facility] PT Assessment - 05/27/15 0934    Standardized Balance Assessment   Standardized Balance Assessment Berg Balance Test;10 meter walk test;Five Times Sit to Stand   Five times sit to stand comments  14.2 seconds  with hands, unable to perform without hands   10 Meter Walk 0.65 m/s   Berg Balance Test   Sit to Stand Able to stand  independently using hands   Standing Unsupported Able to stand safely 2 minutes   Sitting with Back Unsupported but Feet Supported on Floor or Stool Able to sit safely and securely 2 minutes   Stand to Sit Controls descent by using hands   Transfers Able to transfer safely, definite need of hands   Standing Unsupported with Eyes Closed Able to stand 10 seconds safely   Standing Ubsupported with Feet Together Able to place feet together independently and stand 1 minute safely   From Standing, Reach Forward with Outstretched Arm Can reach confidently >25 cm (10")   From Standing Position, Pick up Object from Floor Able to pick up shoe safely and easily   From Standing Position, Turn to Look Behind Over each Shoulder Looks behind from both sides and  weight shifts well   Turn 360 Degrees Able to turn 360 degrees safely but slowly   Standing Unsupported, Alternately Place Feet on Step/Stool Able to complete >2 steps/needs minimal assist   Standing Unsupported, One Foot in Front Able to take small step independently and hold 30 seconds   Standing on One Leg Tries to lift leg/unable to hold 3 seconds but remains standing independently   Total Score 43        TREATMENT:  Physical Performance: Attempted 6MWT however pt unable to perform today due to increased LLE pain as well as increased SOB with minimal exertions. Perform BERG, 65mgait speed, 5TSTS. Also performed ambulation without assistive device. Pt able to ambulate >50' but with significant decrease in speed and bilateral step length. Also worsening stability not demonstrating safety without close supervision/CGA;  Neuromuscular Re-education  Airex shoulder width balance with horizontal and vertical head turns x 30 seconds each; Airex shoulder width balance with eyes open/closed x 30 seconds each; Airex modified tandem balance with eyes open/closed x 30 seconds each; Rocker board balance and weight shifting in both A/P and R/L orientations x 30 seconds each;  Patient required close supervision with all balance exercise. Seated rest breaks provided due to increased fatigue today.                   PT Education - 05/27/15 1206    Education provided Yes   Education Details Plan of care, HEP reinforced, recertification   Person(s) Educated Patient   Methods Explanation   Comprehension Verbalized understanding             PT Long Term Goals - 05/27/15 0913    PT LONG TERM GOAL #1   Title Patient will be independent in home exercise program to improve strength/mobility for better functional independence with ADLs. by 05/18/15   Time 8   Period Weeks   Status On-going   PT LONG TERM GOAL #2   Title Patient (> 690years old) will complete five times sit to  stand test with no hands  in < 15 seconds indicating an increased LE strength and improved balance. by 05/18/15   Baseline 05/27/15: 14.2 seconds (with hands), pt unable to perform sit to stand from regular height char without UE support.    Time 8   Period Weeks   Status On-going   PT LONG TERM GOAL #3   Title Patient will increase six minute walk test distance to >1000 for progression to community ambulator and improve gait  ability by 05/18/15   Baseline 2015-06-01: Deferred due to increased SOB and increased L foot pain today.    Time 8   Period Weeks   Status Not Met   PT LONG TERM GOAL #4   Title Patient will increase 10 meter walk test to >1.74ms as to improve gait speed for better community ambulation and to reduce fall risk. by 05/18/15   Baseline 123m15.5 seconds, 0.65 m/s   Time 8   Period Weeks   Status Partially Met   PT LONG TERM GOAL #5   Title Patient will be able to ambulate at least 50 feet without AD demonstrating good posture and safety, independently for increased home ambulation by 05/18/15   Baseline 3/Mar 15, 2017Able to perform but very slowly and not safely   Time 8   Period Weeks   Status On-going   Additional Long Term Goals   Additional Long Term Goals Yes   PT LONG TERM GOAL #6   Title Patient will increase Berg Balance score to >42/56 to demonstrate decreased fall risk during functional activities.   Baseline 05/2015-05-1541/56   Time 8   Period Weeks   Status Achieved   PT LONG TERM GOAL #7   Title Pt will increase BERG Balance score to >46/56 to demonstrate decreased fall risk during functional activities   Baseline 3/15-Mar-201743/56   Time 8   Period Weeks   Status New               Plan - 032017/03/15208    Clinical Impression Statement Pt demonstrates improvement in balance with BERG score of 43/56 on this date. Gait speed and ambulation are still limited and pt continues to demonstrate reliance on axillary crutch in LUE for ambulation. LE strength is also  still impaired with 5TSTS of 14.2 seconds. Pt demonstrates progress and reports improvement in function since starting therapy. However she has still not achieved all of her goals and continues to demonstrate deficits. Pt will benefit from continued skilled PT services to address deficits in strength, balance, and gait in order to improve function at home.    Pt will benefit from skilled therapeutic intervention in order to improve on the following deficits Abnormal gait;Decreased endurance;Impaired sensation;Obesity;Cardiopulmonary status limiting activity;Decreased activity tolerance;Decreased strength;Difficulty walking;Decreased mobility;Decreased balance;Impaired flexibility;Decreased safety awareness   Rehab Potential Good   Clinical Impairments Affecting Rehab Potential positive: motivation, family support, negative: co-morbidities, deconditioned, high fall risk; Body structures that will need to be addressed: weakness, numbness/tingling, impaired balance, difficulty walking, decreased transfer ability, etc; Due to co-morbidities her clinical presenstation is evolving;    PT Frequency 1x / week   PT Duration 8 weeks   PT Treatment/Interventions Aquatic Therapy;Cryotherapy;Electrical Stimulation;Moist Heat;Balance training;Therapeutic exercise;Therapeutic activities;Functional mobility training;Stair training;Gait training;DME Instruction;Neuromuscular re-education;Patient/family education;Energy conservation   PT Next Visit Plan Progress balance and strengthening. Perform 6MWT if pt can tolerate   PT Home Exercise Plan continue as prescribed.    Consulted and Agree with Plan of Care Patient          G-Codes - [id number]    Functional Assessment Tool Used BERG, 5x sit to stand, 1051mlk time,clinical judgement   Functional Limitation Mobility: Walking and moving around   Mobility: Walking and Moving Around Current Status ([id number] least 40 percent but less than 60 percent impaired,  limited or restricted   Mobility: Walking and Moving Around Goal Status ([id number] least 20 percent but less than 40 percent impaired, limited or restricted  Problem List Patient Active Problem List   Diagnosis Date Noted  . S/P shoulder replacement 07/02/2013   Phillips Grout PT, DPT   Aarib Pulido 05/27/2015, 12:31 PM  [place] MAIN [facility] SERVICES [address] [place], [name], [address] Phone: [phone]   Fax:  [phone]  Name: Hannah Powell MRN: [id number] Date of Birth: [birth year]

## 2015-05-31 ENCOUNTER — Ambulatory Visit: Payer: Commercial Managed Care - HMO | Admitting: Physical Therapy

## 2015-05-31 DIAGNOSIS — M2040 Other hammer toe(s) (acquired), unspecified foot: Secondary | ICD-10-CM | POA: Diagnosis not present

## 2015-05-31 DIAGNOSIS — M79672 Pain in left foot: Secondary | ICD-10-CM | POA: Diagnosis not present

## 2015-06-03 ENCOUNTER — Ambulatory Visit: Payer: Commercial Managed Care - HMO | Admitting: Physical Therapy

## 2015-06-03 DIAGNOSIS — Z6841 Body Mass Index (BMI) 40.0 and over, adult: Secondary | ICD-10-CM | POA: Diagnosis not present

## 2015-06-03 DIAGNOSIS — R197 Diarrhea, unspecified: Secondary | ICD-10-CM | POA: Diagnosis not present

## 2015-06-03 DIAGNOSIS — I10 Essential (primary) hypertension: Secondary | ICD-10-CM | POA: Diagnosis not present

## 2015-06-06 DIAGNOSIS — R197 Diarrhea, unspecified: Secondary | ICD-10-CM | POA: Diagnosis not present

## 2015-06-10 ENCOUNTER — Ambulatory Visit: Payer: Commercial Managed Care - HMO | Admitting: Physical Therapy

## 2015-06-10 ENCOUNTER — Encounter: Payer: Self-pay | Admitting: Physical Therapy

## 2015-06-10 DIAGNOSIS — R2689 Other abnormalities of gait and mobility: Secondary | ICD-10-CM

## 2015-06-10 DIAGNOSIS — Z9181 History of falling: Secondary | ICD-10-CM

## 2015-06-10 DIAGNOSIS — R269 Unspecified abnormalities of gait and mobility: Secondary | ICD-10-CM | POA: Diagnosis not present

## 2015-06-10 DIAGNOSIS — M6281 Muscle weakness (generalized): Secondary | ICD-10-CM | POA: Diagnosis not present

## 2015-06-10 NOTE — Patient Instructions (Addendum)
  SIT TO STAND: No Device     HAVE PILLOWS IN CHAIR TO BE ABLE TO DO IT WITHOUT PULLING ON BAR  Sit with feet shoulder-width apart, on floor.(Make sure that you are in a chair that won't move like a chair against a wall or couch etc) Lean chest forward, raise hips up from surface. Straighten hips and knees. Weight bear equally on left and right sides. 10___ reps per set, _2__ sets per day, _5__ days per week Place left leg closer to sitting surface.    Copyright  VHI. All rights reserved.   Band Walk: Zig Zag   Tie green band around legs, just above knees. Walk forward _both__ feet in a zig zag pattern. Without turning walk backward to start for one zig zag. Repeat _2-3__ zig zags per session.  USE CANE FOR BALANCE  http://plyo.exer.us/80   Copyright  VHI. All rights reserved.    Copyright  VHI. All rights reserved.  Shoulder Retraction   Tie band around door knob (sitting or standing, holding band in both hands) Facing chest height anchor, grasp ends of band and pull hands to chest, squeezing shoulder blades together. Hold _3-5seconds. Repeat _10 times. Do _2_ sessions per day. Safety Note: Be sure anchor is secure.    Copyright  VHI. All rights reserved.  Reverse Fly / Shoulder Retraction  Holding band in both hands, Extend both arms in front of body at shoulder height, palms down, holding band. Move arms out to sides, squeeze shoulder blades together. Repeat _10__ times. Do _2__ sessions per day.   Copyright  VHI. All rights reserved.   Roll   Inhale and bring shoulders up, back, then exhale and relax shoulders down. Repeat _10__ times. Do _2-3__ times per day.  Copyright  VHI. All rights reserved.     USE CANE WHEN WALKING INSIDE THE HOUSE; ONLY USE CRUTCH WHEN WALKING OUTSIDE HOUSE OR AT NIGHT

## 2015-06-10 NOTE — Therapy (Signed)
[place] MAIN [facility] SERVICES [address] [place], [name], [address] Phone: [phone]   Fax:  [phone]  Physical Therapy Treatment  Patient Details  Name: Hannah Powell MRN: [id number] Date of Birth: 09/17/1945 Referring Provider:  Dr. Jennings Books  Encounter Date: 06/10/2015      PT End of Session - 06/10/15 1114    Visit Number 11   Number of Visits 25   Date for PT Re-Evaluation 07/22/15   Authorization Type 2   Authorization Time Period 10   PT Start Time 1002   PT Stop Time 1100   PT Time Calculation (min) 58 min   Equipment Utilized During Treatment --   Activity Tolerance No increased pain;Patient limited by fatigue   Behavior During Therapy [facility] for tasks assessed/performed      Past Medical History  Diagnosis Date  . CHF (congestive heart failure) ([place])   . Shortness of breath   . Hypertension     dr Otho Najjar     . Hypothyroidism   . Anxiety   . Arthritis   . RLS (restless legs syndrome)   . Sleep apnea     cpap      >5 yrs    Past Surgical History  Procedure Laterality Date  . Back surgery      cervical  . Joint replacement      x2 tkr  . Foot arthroplasty    . Total shoulder arthroplasty Left 07/02/2013    Procedure: LEFT TOTAL SHOULDER ARTHROPLASTY;  Surgeon: Marin Shutter, MD;  Location: [facility];  Service: Orthopedics;  Laterality: Left;    There were no vitals filed for this visit.  Visit Diagnosis:  Abnormality of gait  Imbalance  Generalized muscle weakness  At risk for falls      Subjective Assessment - 06/10/15 1008    Subjective Patient reports doing a little better; She is still walking with crutch; She denies any pain today; She is still working with her Physiological scientist 4 times a week 45 min to 1 hour;    Pertinent History personal factors affecting rehab: recent left patella fracture (restrictions of no squats/lunges or anything that would put extra pressure on knee); 5 steps to  enter house ( [place]); CHF (controlled but has shortness of breath with activity), High fall risk, obesity   How long can you sit comfortably? unlimited   How long can you stand comfortably? 3-4 hours   How long can you walk comfortably? 30 + minutes   Diagnostic tests last x-rays of left knee where in November 2016 which looked good;    Patient Stated Goals "get rid of crutches and cane and be able to walk unassisted"; its been 3 years since she walked unassisted;    Currently in Pain? No/denies      TREATMENT:  Warm up on Nustep BLE only level 3 x4 min (Unbilled);  Resisted weighted gait 12.5# backward then forward, no AD, x2 laps, CGA with mod VCs for improved weight shift to improve balance control;  Gait with SPC in LUE x150 feet with close supervision and mod VCS to increase step length, increase erect posture. Patient ambulates with narrow base of support, and trendelenburg right with left hip drop, able to demonstrate better step length and cadence today as compared to previous sessions;  PT instructed patient to use cane with all gait inside home and to only use single crutch when outside home.   Reeducated patient in  HEP: Sit<>stand from chair with 2 pillows without HHA x5 reps; Patient does require min VCs to increase forward trunk lean; recommend patient do sit<>stands with pillows and start reducing pillows as exercise gets easier. Diagonal steps, red tband around legs above knees x10 feet x1 each direction with SPC with min VCS to increase step length when walking backwards;  Educated patient in postural strengthening: Red tband BUE shoulder abduction x10 reps; Red tband BUE rows x10 reps; Posterior shoulder rolls; Patient required min VCS to increase scapular retraction for better postural control;  Instructed patient in 6 min walk test, see below;           [facility] PT Assessment - 06/10/15 0001    6 minute walk test results    Aerobic Endurance Distance Walked  540   Endurance additional comments with LUE crutch; limited ambulator; improved from last reassessment on 04/22/15 which was 410 feet with LUE crutch;                              PT Education - 06/10/15 1108    Education provided Yes   Education Details gait safety, HEP, posture;   Person(s) Educated Patient   Methods Explanation;Verbal cues   Comprehension Verbalized understanding;Returned demonstration;Verbal cues required             PT Long Term Goals - 06/10/15 1117    PT LONG TERM GOAL #1   Title Patient will be independent in home exercise program to improve strength/mobility for better functional independence with ADLs. by 07/22/15   Time 8   Period Weeks   Status On-going   PT LONG TERM GOAL #2   Title Patient (> 52 years old) will complete five times sit to stand test with no hands  in < 15 seconds indicating an increased LE strength and improved balance. by 07/22/15   Baseline 05/27/15: 14.2 seconds (with hands), pt unable to perform sit to stand from regular height char without UE support.    Time 8   Period Weeks   Status On-going   PT LONG TERM GOAL #3   Title Patient will increase six minute walk test distance to >600 feet for progression to community ambulator and improve gait ability by 07/22/15   Baseline 05/27/15: Deferred due to increased SOB and increased L foot pain today.    Time 8   Period Weeks   Status Revised   PT LONG TERM GOAL #4   Title Patient will increase 10 meter walk test to >1.71ms as to improve gait speed for better community ambulation and to reduce fall risk. by 07/22/15   Baseline 169m15.5 seconds, 0.65 m/s   Time 8   Period Weeks   Status Partially Met   PT LONG TERM GOAL #5   Title Patient will be able to ambulate at least 50 feet without AD demonstrating good posture and safety, independently for increased home ambulation by 07/22/15   Baseline 05/27/15: Able to perform but very slowly and not safely   Time 8    Period Weeks   Status On-going   PT LONG TERM GOAL #6   Title Patient will increase Berg Balance score to >42/56 to demonstrate decreased fall risk during functional activities.   Baseline 05/27/15: 43/56   Time 8   Period Weeks   Status Achieved   PT LONG TERM GOAL #7   Title Pt will increase BERG Balance score to >46/56  to demonstrate decreased fall risk during functional activities 07/22/15   Baseline 05/27/15: 43/56   Time 8   Period Weeks   Status New               Plan - 06/10/15 1114    Clinical Impression Statement Patient continues to have fear with gait tasks with less AD. She does demonstrate improved gait safety with SPC Being able to demonstrate better cadence and reciprocal gait technique; Patient requires increased sitting rest break due to fatigue. Re-educated patient in HEP and advanced with new standing exercise and postural strengthening. Patient does demonstrate improved gait ability with increased distance on 6 min walk test. She would benefit from additional skilled PT Intervention to improve balance/gait safety and LE strength.    Pt will benefit from skilled therapeutic intervention in order to improve on the following deficits Abnormal gait;Decreased endurance;Impaired sensation;Obesity;Cardiopulmonary status limiting activity;Decreased activity tolerance;Decreased strength;Difficulty walking;Decreased mobility;Decreased balance;Impaired flexibility;Decreased safety awareness   Rehab Potential Good   Clinical Impairments Affecting Rehab Potential positive: motivation, family support, negative: co-morbidities, deconditioned, high fall risk; Body structures that will need to be addressed: weakness, numbness/tingling, impaired balance, difficulty walking, decreased transfer ability, etc; Due to co-morbidities her clinical presenstation is evolving;    PT Frequency 1x / week   PT Duration 8 weeks   PT Treatment/Interventions Aquatic Therapy;Cryotherapy;Electrical  Stimulation;Moist Heat;Balance training;Therapeutic exercise;Therapeutic activities;Functional mobility training;Stair training;Gait training;DME Instruction;Neuromuscular re-education;Patient/family education;Energy conservation   PT Next Visit Plan Progress balance and strengthening. Perform 6MWT if pt can tolerate   PT Home Exercise Plan continue as prescribed.    Consulted and Agree with Plan of Care Patient        Problem List Patient Active Problem List   Diagnosis Date Noted  . S/P shoulder replacement 07/02/2013    Trotter,Margaret PT, DPT 06/10/2015, 11:19 AM  [place] MAIN [facility] SERVICES [place], [name], [address] Phone: [phone]   Fax:  [phone]  Name: Hannah Powell MRN: [id number] Date of Birth: 06-Apr-1945

## 2015-06-15 ENCOUNTER — Encounter: Payer: Self-pay | Admitting: Physical Therapy

## 2015-06-15 ENCOUNTER — Ambulatory Visit: Payer: Commercial Managed Care - HMO | Admitting: Physical Therapy

## 2015-06-15 DIAGNOSIS — R269 Unspecified abnormalities of gait and mobility: Secondary | ICD-10-CM

## 2015-06-15 DIAGNOSIS — R2689 Other abnormalities of gait and mobility: Secondary | ICD-10-CM | POA: Diagnosis not present

## 2015-06-15 DIAGNOSIS — M6281 Muscle weakness (generalized): Secondary | ICD-10-CM | POA: Diagnosis not present

## 2015-06-15 DIAGNOSIS — Z9181 History of falling: Secondary | ICD-10-CM | POA: Diagnosis not present

## 2015-06-15 NOTE — Patient Instructions (Addendum)
  Copyright  VHI. All rights reserved.   Lower Trunk Rotation Stretch  Lying on back with knees bent, Keeping back flat and feet together, rotate knees side to side slowly and in pain free range of motion.  Hold _2___ seconds. Repeat for 1-2 minutes. Do __1__ sets per session. Do __2-3__ sessions per day.    Abduction    Lift leg up toward ceiling. Return. Use _2___ lbs on ankle. Repeat _15___ times each leg. Do _1-2___ sessions per day.  http://gt2.exer.us/386   Copyright  VHI. All rights reserved.  ABDUCTION: Side-Lying (Active)    Lie on right side, top left leg straight. Raise top leg as far as possible.Then do small circles, clockwise/counterclockwise, 5 times each; Complete _1-2__ sets of _5__ repetitions. Perform _1-2__ sessions per day.  http://gtsc.exer.us/95   Copyright  VHI. All rights reserved.  Knee Extension: Sit to Stand (Eccentric)    While doing sit to stand, add lifting something or throwing something... Stand up and lift arms overhead, then sit down and bring back to center. Repeat 10 times, 2-3 times per day;  Copyright  VHI. All rights reserved.

## 2015-06-15 NOTE — Therapy (Signed)
[place] MAIN [facility] SERVICES [address] [place], [name], [address] Phone: [phone]   Fax:  [phone]  Physical Therapy Treatment  Patient Details  Name: Hannah Powell MRN: [id number] Date of Birth: [birth year] Referring Provider:  Dr. Jennings Books  Encounter Date: 06/15/2015      PT End of Session - 06/15/15 1550    Visit Number 12   Number of Visits 25   Date for PT Re-Evaluation 07/22/15   Authorization Type 3   Authorization Time Period 10   PT Start Time 1425   PT Stop Time 1520   PT Time Calculation (min) 55 min   Activity Tolerance No increased pain;Patient limited by fatigue   Behavior During Therapy [facility] for tasks assessed/performed      Past Medical History  Diagnosis Date  . CHF (congestive heart failure) ([place])   . Shortness of breath   . Hypertension     dr Otho Najjar     . Hypothyroidism   . Anxiety   . Arthritis   . RLS (restless legs syndrome)   . Sleep apnea     cpap      >5 yrs    Past Surgical History  Procedure Laterality Date  . Back surgery      cervical  . Joint replacement      x2 tkr  . Foot arthroplasty    . Total shoulder arthroplasty Left 07/02/2013    Procedure: LEFT TOTAL SHOULDER ARTHROPLASTY;  Surgeon: Marin Shutter, MD;  Location: [facility];  Service: Orthopedics;  Laterality: Left;    There were no vitals filed for this visit.  Visit Diagnosis:  Abnormality of gait  Imbalance  Generalized muscle weakness  At risk for falls      Subjective Assessment - 06/15/15 1428    Subjective Patient report increased left lower back pain with doing housework. She reports that it started yesterday and has just been hurting. She reports having to treat her hard work floors and just really made her back sore;    Pertinent History personal factors affecting rehab: recent left patella fracture (restrictions of no squats/lunges or anything that would put extra pressure on knee); 5 steps to  enter house ( [place]); CHF (controlled but has shortness of breath with activity), High fall risk, obesity   How long can you sit comfortably? unlimited   How long can you stand comfortably? 3-4 hours   How long can you walk comfortably? 30 + minutes   Diagnostic tests last x-rays of left knee where in November 2016 which looked good;    Patient Stated Goals "get rid of crutches and cane and be able to walk unassisted"; its been 3 years since she walked unassisted;    Currently in Pain? Yes   Pain Score 4    Pain Location Knee   Pain Orientation Left   Pain Descriptors / Indicators Aching;Sore   Pain Type Chronic pain   Multiple Pain Sites Yes   Pain Score 7   Pain Location Back   Pain Orientation Lower   Pain Descriptors / Indicators Aching;Sore   Pain Type Acute pain   Pain Radiating Towards left hip;    Pain Onset Yesterday              Warm up on Nustep BLE only level 2 x 5 min concurrent with moist heat to left low back (unbilled);  Sidelying: Hip abduction SLR x10 bilaterally; LLE hip circles, clockwise/counterclockwise x5  each direction x1 set each; Patient able to demonstrate better hip abduction ROM, but continues to require mod VCs to avoid hip ER for better abductor strengthening. She fatigued quickly with hip circles requiring min VCs to avoid trunk rotation; Hooklying: Lumbar trunk rotation x1 min each direction;   Sit<>stand from low mat table with BUE ball toss x10 reps; Patient reports fatigue with sit<>Stand but is able to complete without pushing off from mat table;   Educated patient in safe gait technique: Gait with SPC in LUE x300+ feet with supervision and min VCS to increase step length, increase erect posture. Patient ambulates with normal base of support, and less trendelenburg right with left hip drop, able to demonstrate better step length and cadence today as compared to previous sessions; Gait outside on uneven surfaces: On grass with SPC,  close supervision, 3 point gait pattern x75 feet with min VCS for increased gaze stabilization and to increase ankle control/strategies; Up/down curb with SPC x2 reps with min A for stability; Patient had increased difficulty shifting weight forward having increased challenge and fear with curb negotiation; On uneven concrete x200 feet with SPC with cues for 2 point gait pattern and to increase step length; She was able to ambulate 15 feet on even surface without AD with uneven cadence but less trendelenburg gait;  PT instructed patient to use cane with all gait inside home and to only use single crutch when outside home.  Patient fatigued quickly with all gait training with increased shortness of breath. She required frequent sitting rest breaks. Advanced HEP- see attached.                          PT Education - 06/15/15 1549    Education provided Yes   Education Details LE strengthening, gait safety   Person(s) Educated Patient   Methods Explanation;Verbal cues   Comprehension Verbalized understanding;Returned demonstration;Verbal cues required             PT Long Term Goals - 06/10/15 1117    PT LONG TERM GOAL #1   Title Patient will be independent in home exercise program to improve strength/mobility for better functional independence with ADLs. by 07/22/15   Time 8   Period Weeks   Status On-going   PT LONG TERM GOAL #2   Title Patient (> 27 years old) will complete five times sit to stand test with no hands  in < 15 seconds indicating an increased LE strength and improved balance. by 07/22/15   Baseline 05/27/15: 14.2 seconds (with hands), pt unable to perform sit to stand from regular height char without UE support.    Time 8   Period Weeks   Status On-going   PT LONG TERM GOAL #3   Title Patient will increase six minute walk test distance to >600 feet for progression to community ambulator and improve gait ability by 07/22/15   Baseline 05/27/15:  Deferred due to increased SOB and increased L foot pain today.    Time 8   Period Weeks   Status Revised   PT LONG TERM GOAL #4   Title Patient will increase 10 meter walk test to >1.59ms as to improve gait speed for better community ambulation and to reduce fall risk. by 07/22/15   Baseline 158m15.5 seconds, 0.65 m/s   Time 8   Period Weeks   Status Partially Met   PT LONG TERM GOAL #5   Title Patient will be  able to ambulate at least 50 feet without AD demonstrating good posture and safety, independently for increased home ambulation by 07/22/15   Baseline 05/27/15: Able to perform but very slowly and not safely   Time 8   Period Weeks   Status On-going   PT LONG TERM GOAL #6   Title Patient will increase Berg Balance score to >42/56 to demonstrate decreased fall risk during functional activities.   Baseline 05/27/15: 43/56   Time 8   Period Weeks   Status Achieved   PT LONG TERM GOAL #7   Title Pt will increase BERG Balance score to >46/56 to demonstrate decreased fall risk during functional activities 07/22/15   Baseline 05/27/15: 43/56   Time 8   Period Weeks   Status New               Plan - 06/15/15 1550    Clinical Impression Statement Patient demonstrates improved LE strength being able to transfer sit<>stand from low mat table without HHA; She continues to be fearful with gait tasks with less AD (SPC vs crutch) but overall is improving. She would benefit from additional skilled PT Intervention to improve LE strength, balance/gait safety;    Pt will benefit from skilled therapeutic intervention in order to improve on the following deficits Abnormal gait;Decreased endurance;Impaired sensation;Obesity;Cardiopulmonary status limiting activity;Decreased activity tolerance;Decreased strength;Difficulty walking;Decreased mobility;Decreased balance;Impaired flexibility;Decreased safety awareness   Rehab Potential Good   Clinical Impairments Affecting Rehab Potential  positive: motivation, family support, negative: co-morbidities, deconditioned, high fall risk; Body structures that will need to be addressed: weakness, numbness/tingling, impaired balance, difficulty walking, decreased transfer ability, etc; Due to co-morbidities her clinical presenstation is evolving;    PT Frequency 1x / week   PT Duration 8 weeks   PT Treatment/Interventions Aquatic Therapy;Cryotherapy;Electrical Stimulation;Moist Heat;Balance training;Therapeutic exercise;Therapeutic activities;Functional mobility training;Stair training;Gait training;DME Instruction;Neuromuscular re-education;Patient/family education;Energy conservation   PT Next Visit Plan Progress balance and strengthening. Perform 6MWT if pt can tolerate   PT Home Exercise Plan advanced- see patient instructions;    Consulted and Agree with Plan of Care Patient        Problem List Patient Active Problem List   Diagnosis Date Noted  . S/P shoulder replacement 07/02/2013    Hannah Powell PT, DPT 06/15/2015, 3:51 PM  [place] MAIN [facility] SERVICES [address] [place], [name], [address] Phone: [phone]   Fax:  [phone]  Name: Hannah Powell MRN: [id number] Date of Birth: 02/17/1946

## 2015-06-20 ENCOUNTER — Ambulatory Visit: Payer: Commercial Managed Care - HMO | Attending: Neurology | Admitting: Physical Therapy

## 2015-06-20 ENCOUNTER — Encounter: Payer: Self-pay | Admitting: Physical Therapy

## 2015-06-20 DIAGNOSIS — R269 Unspecified abnormalities of gait and mobility: Secondary | ICD-10-CM | POA: Diagnosis not present

## 2015-06-20 DIAGNOSIS — M6281 Muscle weakness (generalized): Secondary | ICD-10-CM

## 2015-06-20 DIAGNOSIS — Z9181 History of falling: Secondary | ICD-10-CM

## 2015-06-20 DIAGNOSIS — R2689 Other abnormalities of gait and mobility: Secondary | ICD-10-CM | POA: Insufficient documentation

## 2015-06-20 NOTE — Therapy (Signed)
[place] MAIN [facility] SERVICES [address] [place], [name], [address] Phone: [phone]   Fax:  [phone]  Physical Therapy Treatment  Patient Details  Name: Hannah Powell MRN: [id number] Date of Birth: 10-Oct-1945 Referring Provider:  Dr. Jennings Books  Encounter Date: 06/20/2015      PT End of Session - 06/20/15 1310    Visit Number 13   Number of Visits 25   Date for PT Re-Evaluation 07/22/15   Authorization Type 4   Authorization Time Period 10   PT Start Time 1300   PT Stop Time 1345   PT Time Calculation (min) 45 min   Activity Tolerance No increased pain;Patient limited by fatigue   Behavior During Therapy [facility] for tasks assessed/performed      Past Medical History  Diagnosis Date  . CHF (congestive heart failure) ([place])   . Shortness of breath   . Hypertension     dr Otho Najjar     . Hypothyroidism   . Anxiety   . Arthritis   . RLS (restless legs syndrome)   . Sleep apnea     cpap      >5 yrs    Past Surgical History  Procedure Laterality Date  . Back surgery      cervical  . Joint replacement      x2 tkr  . Foot arthroplasty    . Total shoulder arthroplasty Left 07/02/2013    Procedure: LEFT TOTAL SHOULDER ARTHROPLASTY;  Surgeon: Marin Shutter, MD;  Location: [place];  Service: Orthopedics;  Laterality: Left;    There were no vitals filed for this visit.  Visit Diagnosis:  Abnormality of gait  Imbalance  Generalized muscle weakness  At risk for falls      Subjective Assessment - 06/20/15 1307    Subjective Patient presents to therapy with SPC with triangular base and crutch; She reports no pain; She has bad allergies and is hoarse today;    Pertinent History personal factors affecting rehab: recent left patella fracture (restrictions of no squats/lunges or anything that would put extra pressure on knee); 5 steps to enter house ( [place]); CHF (controlled but has shortness of breath with activity), High  fall risk, obesity   How long can you sit comfortably? unlimited   How long can you stand comfortably? 3-4 hours   How long can you walk comfortably? 30 + minutes   Diagnostic tests last x-rays of left knee where in November 2016 which looked good;    Patient Stated Goals "get rid of crutches and cane and be able to walk unassisted"; its been 3 years since she walked unassisted;    Currently in Pain? No/denies   Pain Onset Yesterday           TREATMENT:  Warm up on Nustep BUE/BLE level 2 x4 min (Unbilled);  Standing: Heel/toe raises x15 with cues to avoid hip movement for better ankle strengthening; Alternate march x1 min with min VCs to increase hip flexion and to slow down on LE movement for better balance challenge; Hip abduction circles x10 each LE with cues for erect posture and to increase ROM; Educated patient in other exercises for the pool including use of pool noodles and kickboard; Patient was provided with written handout for increased compliance;  Resisted weighted gait, 17.5#, backward, with eccentric forward x2 laps (15 feet each) with min A and mod VCs to increase step length, increase weight shift and improve gait speed;  Patient fatigued after treatment session;                         PT Education - 06/20/15 1310    Education provided Yes   Education Details LE strengthening, gait safety;    Person(s) Educated Patient   Methods Explanation;Verbal cues   Comprehension Verbalized understanding;Returned demonstration;Verbal cues required             PT Long Term Goals - 06/10/15 1117    PT LONG TERM GOAL #1   Title Patient will be independent in home exercise program to improve strength/mobility for better functional independence with ADLs. by 07/22/15   Time 8   Period Weeks   Status On-going   PT LONG TERM GOAL #2   Title Patient (> 50 years old) will complete five times sit to stand test with no hands  in < 15 seconds indicating  an increased LE strength and improved balance. by 07/22/15   Baseline 05/27/15: 14.2 seconds (with hands), pt unable to perform sit to stand from regular height char without UE support.    Time 8   Period Weeks   Status On-going   PT LONG TERM GOAL #3   Title Patient will increase six minute walk test distance to >600 feet for progression to community ambulator and improve gait ability by 07/22/15   Baseline 05/27/15: Deferred due to increased SOB and increased L foot pain today.    Time 8   Period Weeks   Status Revised   PT LONG TERM GOAL #4   Title Patient will increase 10 meter walk test to >1.42ms as to improve gait speed for better community ambulation and to reduce fall risk. by 07/22/15   Baseline 161m15.5 seconds, 0.65 m/s   Time 8   Period Weeks   Status Partially Met   PT LONG TERM GOAL #5   Title Patient will be able to ambulate at least 50 feet without AD demonstrating good posture and safety, independently for increased home ambulation by 07/22/15   Baseline 05/27/15: Able to perform but very slowly and not safely   Time 8   Period Weeks   Status On-going   PT LONG TERM GOAL #6   Title Patient will increase Berg Balance score to >42/56 to demonstrate decreased fall risk during functional activities.   Baseline 05/27/15: 43/56   Time 8   Period Weeks   Status Achieved   PT LONG TERM GOAL #7   Title Pt will increase BERG Balance score to >46/56 to demonstrate decreased fall risk during functional activities 07/22/15   Baseline 05/27/15: 43/56   Time 8   Period Weeks   Status New               Plan - 06/20/15 1341    Clinical Impression Statement Instructed patient in BLE strengthening and ROM exercise for the pool. She will be going out of town and will have access to a pool. Was able to advance gait training today with increased resistance on resisted weighted gait. Patient fatigues quickly and had difficulty increasing step length due to fatigue and weakness.  She would benefit from additional skilled PT Intervention to improve LE strength, balance and gait safety;    Pt will benefit from skilled therapeutic intervention in order to improve on the following deficits Abnormal gait;Decreased endurance;Impaired sensation;Obesity;Cardiopulmonary status limiting activity;Decreased activity tolerance;Decreased strength;Difficulty walking;Decreased mobility;Decreased balance;Impaired flexibility;Decreased safety awareness   Rehab Potential Good  Clinical Impairments Affecting Rehab Potential positive: motivation, family support, negative: co-morbidities, deconditioned, high fall risk; Body structures that will need to be addressed: weakness, numbness/tingling, impaired balance, difficulty walking, decreased transfer ability, etc; Due to co-morbidities her clinical presenstation is evolving;    PT Frequency 1x / week   PT Duration 8 weeks   PT Treatment/Interventions Aquatic Therapy;Cryotherapy;Electrical Stimulation;Moist Heat;Balance training;Therapeutic exercise;Therapeutic activities;Functional mobility training;Stair training;Gait training;DME Instruction;Neuromuscular re-education;Patient/family education;Energy conservation   PT Next Visit Plan Progress balance and strengthening. Perform 6MWT if pt can tolerate   PT Home Exercise Plan advanced- see patient instructions;    Consulted and Agree with Plan of Care Patient        Problem List Patient Active Problem List   Diagnosis Date Noted  . S/P shoulder replacement 07/02/2013    Sharron Simpson PT, DPT 06/20/2015, 1:43 PM  [place] MAIN [facility] SERVICES [address] [place], [name], [address] Phone: [phone]   Fax:  [phone]  Name: Hannah Powell MRN: [id number] Date of Birth: [birth year]

## 2015-06-20 NOTE — Patient Instructions (Addendum)
  Heel Raise: Bilateral (Standing)    Rise on balls of feet, then try to pull toes up;  Repeat _10___ times per set. Do __2__ sets per session. Do __2__ sessions per day.  http://orth.exer.us/39   Copyright  VHI. All rights reserved.  March    Lift knee toward chest to 90 bend, then lower leg as knee is straightened. Session: March __1__ minutes. Do __2__ sessions per week. Arm movement: Swing, elbows straight (UEP-1) Breaststroke (UEP-3) Overhand crawl (UEP-4) Move: Forward   Copyright  VHI. All rights reserved.  Walking Backward With [employer] backward, perform a backward breaststroke motion: arms outstretched with the thumbs up, pull arms together so that palms meet. Repeat sequence. Perform __10_ laps.  Copyright  VHI. All rights reserved.  Side Stepping    In vertical position, move sideways by lifting arms and moving one leg out to side. Then pull arms down and legs together. Perform _5__ laps leading with left leg. Perform _5__ laps leading with right leg.  Copyright  VHI. All rights reserved.  Bound: Lateral    Start beside incline. Jump, pushing up and out to side, gaining distance and height. Land on uphill foot only. Push off immediately and repeat downhill, landing on downhill foot only. Repeat ___10 times. Repeat on other side for set.  http://plyo.exer.us/93   Copyright  VHI. All rights reserved.  SIT TO STAND: No Device    Sit on steps at pool, with feet shoulder-width apart, on floor. Lean chest forward, raise hips up from surface. Straighten hips and knees. Weight bear equally on left and right sides. _10__ reps per set, _1__ sets per day, _ Copyright  VHI. All rights reserved.  Task: on Roller    Stand on noodle with boot feet (feet apart or one foot in front of other), try to do activity, such as head turns, tossing a ball, reaching, etc   Copyright  VHI. All rights reserved.  Double Kickboard Marching    In deep  or shallow water, stand with one kickboard under each foot. Slowly alternate bringing knees to chest in marching motion. Perform _10__ reps.  Copyright  VHI. All rights reserved.  Kickboard Ankle Motion    Stand on kickboard with right foot. Slowly move ankle up/down, in/out, clockwise/counterclockwise. Perform _10__ reps.  Copyright  VHI. All rights reserved.  Lower Extremity Rhythmic Stabilization With Kickboard Circles    Stand in pool, kick one let out to side, then do big circles, outward.  Repeat 10 times on each side;  Copyright  VHI. All rights reserved.  Walking Kickboard Diagonal Push / Pull With Lunge    Stand with both hands holding kickboard. Lunge forward with left leg, stabilize abdominals, rotate torso 45 to inside, push/pull kickboard. Lunge forward with other leg and repeat push/pull. Perform _2__ laps. Perform __2_ laps backward.  Copyright  VHI. All rights reserved.  Hip External Rotation Circles: Transverse Plane Stability    Side-lying, legs slightly forward. Lift top leg above hip height. Make __5_ circles clockwise, then repeat counterclockwise keeping pelvis still. Repeat with other leg. Challenge: Hand on hip not floor. Do __2_ times per day.  http://ss.exer.us/85   Copyright  VHI. All rights reserved.

## 2015-07-07 ENCOUNTER — Ambulatory Visit: Payer: Commercial Managed Care - HMO | Admitting: Physical Therapy

## 2015-07-07 DIAGNOSIS — J069 Acute upper respiratory infection, unspecified: Secondary | ICD-10-CM | POA: Diagnosis not present

## 2015-07-07 DIAGNOSIS — I10 Essential (primary) hypertension: Secondary | ICD-10-CM | POA: Diagnosis not present

## 2015-07-07 DIAGNOSIS — J441 Chronic obstructive pulmonary disease with (acute) exacerbation: Secondary | ICD-10-CM | POA: Diagnosis not present

## 2015-07-11 ENCOUNTER — Ambulatory Visit: Payer: Commercial Managed Care - HMO | Admitting: Physical Therapy

## 2015-07-11 DIAGNOSIS — E7800 Pure hypercholesterolemia, unspecified: Secondary | ICD-10-CM | POA: Diagnosis not present

## 2015-07-11 DIAGNOSIS — I10 Essential (primary) hypertension: Secondary | ICD-10-CM | POA: Diagnosis not present

## 2015-07-11 DIAGNOSIS — J441 Chronic obstructive pulmonary disease with (acute) exacerbation: Secondary | ICD-10-CM | POA: Diagnosis not present

## 2015-07-11 DIAGNOSIS — E034 Atrophy of thyroid (acquired): Secondary | ICD-10-CM | POA: Diagnosis not present

## 2015-07-11 DIAGNOSIS — J438 Other emphysema: Secondary | ICD-10-CM | POA: Diagnosis not present

## 2015-07-14 ENCOUNTER — Encounter: Payer: Self-pay | Admitting: Physical Therapy

## 2015-07-18 ENCOUNTER — Emergency Department: Payer: Commercial Managed Care - HMO

## 2015-07-18 ENCOUNTER — Inpatient Hospital Stay
Admit: 2015-07-18 | Discharge: 2015-07-18 | Disposition: A | Discharge status: Home / Self Care | Payer: Commercial Managed Care - HMO | Attending: Internal Medicine | Admitting: Internal Medicine

## 2015-07-18 ENCOUNTER — Inpatient Hospital Stay
Admission: EM | Admit: 2015-07-18 | Discharge: 2015-07-22 | DRG: 190 | Disposition: A | Discharge status: Home / Self Care | Payer: Commercial Managed Care - HMO | Attending: Internal Medicine | Admitting: Internal Medicine

## 2015-07-18 DIAGNOSIS — J440 Chronic obstructive pulmonary disease with acute lower respiratory infection: Secondary | ICD-10-CM | POA: Diagnosis not present

## 2015-07-18 DIAGNOSIS — J209 Acute bronchitis, unspecified: Secondary | ICD-10-CM | POA: Diagnosis present

## 2015-07-18 DIAGNOSIS — Z79899 Other long term (current) drug therapy: Secondary | ICD-10-CM

## 2015-07-18 DIAGNOSIS — J9811 Atelectasis: Secondary | ICD-10-CM | POA: Diagnosis present

## 2015-07-18 DIAGNOSIS — R200 Anesthesia of skin: Secondary | ICD-10-CM | POA: Diagnosis not present

## 2015-07-18 DIAGNOSIS — I10 Essential (primary) hypertension: Secondary | ICD-10-CM | POA: Diagnosis not present

## 2015-07-18 DIAGNOSIS — Z6841 Body Mass Index (BMI) 40.0 and over, adult: Secondary | ICD-10-CM | POA: Diagnosis not present

## 2015-07-18 DIAGNOSIS — G4733 Obstructive sleep apnea (adult) (pediatric): Secondary | ICD-10-CM | POA: Diagnosis not present

## 2015-07-18 DIAGNOSIS — I5033 Acute on chronic diastolic (congestive) heart failure: Secondary | ICD-10-CM | POA: Diagnosis not present

## 2015-07-18 DIAGNOSIS — J449 Chronic obstructive pulmonary disease, unspecified: Secondary | ICD-10-CM | POA: Diagnosis not present

## 2015-07-18 DIAGNOSIS — Z96619 Presence of unspecified artificial shoulder joint: Secondary | ICD-10-CM | POA: Diagnosis present

## 2015-07-18 DIAGNOSIS — M1990 Unspecified osteoarthritis, unspecified site: Secondary | ICD-10-CM | POA: Diagnosis present

## 2015-07-18 DIAGNOSIS — J441 Chronic obstructive pulmonary disease with (acute) exacerbation: Secondary | ICD-10-CM | POA: Diagnosis not present

## 2015-07-18 DIAGNOSIS — J9621 Acute and chronic respiratory failure with hypoxia: Secondary | ICD-10-CM | POA: Diagnosis present

## 2015-07-18 DIAGNOSIS — Z7982 Long term (current) use of aspirin: Secondary | ICD-10-CM | POA: Diagnosis not present

## 2015-07-18 DIAGNOSIS — E669 Obesity, unspecified: Secondary | ICD-10-CM | POA: Diagnosis present

## 2015-07-18 DIAGNOSIS — R739 Hyperglycemia, unspecified: Secondary | ICD-10-CM | POA: Diagnosis present

## 2015-07-18 DIAGNOSIS — R05 Cough: Secondary | ICD-10-CM | POA: Diagnosis not present

## 2015-07-18 DIAGNOSIS — I509 Heart failure, unspecified: Secondary | ICD-10-CM | POA: Diagnosis not present

## 2015-07-18 DIAGNOSIS — J9622 Acute and chronic respiratory failure with hypercapnia: Secondary | ICD-10-CM | POA: Diagnosis present

## 2015-07-18 DIAGNOSIS — N289 Disorder of kidney and ureter, unspecified: Secondary | ICD-10-CM

## 2015-07-18 DIAGNOSIS — I110 Hypertensive heart disease with heart failure: Secondary | ICD-10-CM | POA: Diagnosis not present

## 2015-07-18 DIAGNOSIS — R0689 Other abnormalities of breathing: Secondary | ICD-10-CM | POA: Diagnosis not present

## 2015-07-18 DIAGNOSIS — R059 Cough, unspecified: Secondary | ICD-10-CM

## 2015-07-18 DIAGNOSIS — E039 Hypothyroidism, unspecified: Secondary | ICD-10-CM | POA: Diagnosis present

## 2015-07-18 DIAGNOSIS — G2581 Restless legs syndrome: Secondary | ICD-10-CM | POA: Diagnosis present

## 2015-07-18 DIAGNOSIS — F419 Anxiety disorder, unspecified: Secondary | ICD-10-CM | POA: Diagnosis present

## 2015-07-18 DIAGNOSIS — R29898 Other symptoms and signs involving the musculoskeletal system: Secondary | ICD-10-CM | POA: Diagnosis not present

## 2015-07-18 DIAGNOSIS — R2689 Other abnormalities of gait and mobility: Secondary | ICD-10-CM | POA: Diagnosis not present

## 2015-07-18 DIAGNOSIS — J9692 Respiratory failure, unspecified with hypercapnia: Secondary | ICD-10-CM

## 2015-07-18 DIAGNOSIS — R55 Syncope and collapse: Secondary | ICD-10-CM | POA: Diagnosis not present

## 2015-07-18 DIAGNOSIS — R0602 Shortness of breath: Secondary | ICD-10-CM | POA: Diagnosis not present

## 2015-07-18 DIAGNOSIS — R0902 Hypoxemia: Secondary | ICD-10-CM | POA: Diagnosis not present

## 2015-07-18 DIAGNOSIS — I5031 Acute diastolic (congestive) heart failure: Secondary | ICD-10-CM | POA: Diagnosis present

## 2015-07-18 DIAGNOSIS — Z87891 Personal history of nicotine dependence: Secondary | ICD-10-CM | POA: Diagnosis not present

## 2015-07-18 DIAGNOSIS — R296 Repeated falls: Secondary | ICD-10-CM | POA: Diagnosis not present

## 2015-07-18 DIAGNOSIS — J90 Pleural effusion, not elsewhere classified: Secondary | ICD-10-CM | POA: Diagnosis not present

## 2015-07-18 LAB — BLOOD GAS, ARTERIAL
ACID-BASE EXCESS: 12.8 mmol/L — AB (ref 0.0–3.0)
ALLENS TEST (PASS/FAIL): POSITIVE — AB
Acid-Base Excess: 8.7 mmol/L — ABNORMAL HIGH (ref 0.0–3.0)
Allens test (pass/fail): POSITIVE — AB
BICARBONATE: 44.6 meq/L — AB (ref 21.0–28.0)
Bicarbonate: 38.9 mEq/L — ABNORMAL HIGH (ref 21.0–28.0)
DELIVERY SYSTEMS: POSITIVE
Expiratory PAP: 6
FIO2: 0.32
FIO2: 0.28
Inspiratory PAP: 12
LHR: 8 {breaths}/min
O2 Saturation: 91.5 %
O2 Saturation: 84 %
PCO2 ART: 95 mmHg — AB (ref 32.0–48.0)
PH ART: 7.28 — AB (ref 7.350–7.450)
Patient temperature: 37
Patient temperature: 37
pCO2 arterial: 79 mmHg (ref 32.0–48.0)
pH, Arterial: 7.3 — ABNORMAL LOW (ref 7.350–7.450)
pO2, Arterial: 70 mmHg — ABNORMAL LOW (ref 83.0–108.0)
pO2, Arterial: 54 mmHg — ABNORMAL LOW (ref 83.0–108.0)

## 2015-07-18 LAB — CBC WITH DIFFERENTIAL/PLATELET
Basophils Absolute: 0 10*3/uL (ref 0–0.1)
Basophils Relative: 0 %
EOS PCT: 3 %
Eosinophils Absolute: 0.2 10*3/uL (ref 0–0.7)
HCT: 46.4 % (ref 35.0–47.0)
Hemoglobin: 14.7 g/dL (ref 12.0–16.0)
LYMPHS ABS: 0.9 10*3/uL — AB (ref 1.0–3.6)
Lymphocytes Relative: 11 %
MCH: 30.2 pg (ref 26.0–34.0)
MCHC: 31.8 g/dL — AB (ref 32.0–36.0)
MCV: 95 fL (ref 80.0–100.0)
MONO ABS: 0.6 10*3/uL (ref 0.2–0.9)
Monocytes Relative: 8 %
Neutro Abs: 6.2 10*3/uL (ref 1.4–6.5)
Neutrophils Relative %: 78 %
PLATELETS: 195 10*3/uL (ref 150–440)
RBC: 4.88 MIL/uL (ref 3.80–5.20)
RDW: 19.1 % — ABNORMAL HIGH (ref 11.5–14.5)
WBC: 7.9 10*3/uL (ref 3.6–11.0)

## 2015-07-18 LAB — COMPREHENSIVE METABOLIC PANEL
ALK PHOS: 107 U/L (ref 38–126)
ALT: 74 U/L — AB (ref 14–54)
AST: 53 U/L — AB (ref 15–41)
Albumin: 3.7 g/dL (ref 3.5–5.0)
Anion gap: 7 (ref 5–15)
BILIRUBIN TOTAL: 0.7 mg/dL (ref 0.3–1.2)
BUN: 27 mg/dL — AB (ref 6–20)
CALCIUM: 8.8 mg/dL — AB (ref 8.9–10.3)
CHLORIDE: 97 mmol/L — AB (ref 101–111)
CO2: 39 mmol/L — ABNORMAL HIGH (ref 22–32)
CREATININE: 1.07 mg/dL — AB (ref 0.44–1.00)
GFR calc Af Amer: >60 mL/min (ref >60)
GFR, EST NON AFRICAN AMERICAN: 52 mL/min — AB (ref >60)
Glucose, Bld: 134 mg/dL — ABNORMAL HIGH (ref 65–99)
Potassium: 3.7 mmol/L (ref 3.5–5.1)
Sodium: 143 mmol/L (ref 135–145)
Total Protein: 6.2 g/dL — ABNORMAL LOW (ref 6.5–8.1)

## 2015-07-18 LAB — TROPONIN I: TROPONIN I: 0.03 ng/mL (ref <0.031)

## 2015-07-18 LAB — PHOSPHORUS: PHOSPHORUS: 3.6 mg/dL (ref 2.5–4.6)

## 2015-07-18 LAB — MRSA PCR SCREENING: MRSA by PCR: NEGATIVE

## 2015-07-18 LAB — BRAIN NATRIURETIC PEPTIDE: B Natriuretic Peptide: 502 pg/mL — ABNORMAL HIGH (ref 0.0–100.0)

## 2015-07-18 LAB — MAGNESIUM: MAGNESIUM: 2.5 mg/dL — AB (ref 1.7–2.4)

## 2015-07-18 MED ORDER — BUDESONIDE 0.5 MG/2ML IN SUSP
0.5000 mg | Freq: Two times a day (BID) | RESPIRATORY_TRACT | Status: DC
  Administered 2015-07-18 – 2015-07-22 (×8): 0.5 mg via RESPIRATORY_TRACT
  Filled 2015-07-18 (×11): qty 2

## 2015-07-18 MED ORDER — SODIUM CHLORIDE 0.9 % IV SOLN: 250.0000 mL | INTRAVENOUS | Status: DC | PRN

## 2015-07-18 MED ORDER — PRAMIPEXOLE DIHYDROCHLORIDE 1 MG PO TABS
3.0000 mg | ORAL_TABLET | Freq: Every day | ORAL | Status: DC
  Administered 2015-07-18 – 2015-07-21 (×4): 3 mg via ORAL
  Filled 2015-07-18 (×4): qty 2
  Filled 2015-07-18: qty 3

## 2015-07-18 MED ORDER — ZOLPIDEM TARTRATE 5 MG PO TABS
5.0000 mg | ORAL_TABLET | Freq: Every evening | ORAL | Status: DC | PRN
Start: 2015-07-18 — End: 2015-07-22
  Administered 2015-07-18 – 2015-07-21 (×4): 5 mg via ORAL
  Filled 2015-07-18 (×4): qty 1

## 2015-07-18 MED ORDER — LOSARTAN POTASSIUM 50 MG PO TABS
25.0000 mg | ORAL_TABLET | Freq: Every day | ORAL | Status: DC
  Administered 2015-07-19 – 2015-07-22 (×4): 25 mg via ORAL
  Filled 2015-07-18 (×4): qty 1

## 2015-07-18 MED ORDER — METHYLPREDNISOLONE SODIUM SUCC 40 MG IJ SOLR
40.0000 mg | Freq: Two times a day (BID) | INTRAMUSCULAR | Status: DC
  Administered 2015-07-18 – 2015-07-21 (×8): 40 mg via INTRAVENOUS
  Filled 2015-07-18 (×8): qty 1

## 2015-07-18 MED ORDER — IPRATROPIUM-ALBUTEROL 0.5-2.5 (3) MG/3ML IN SOLN
3.0000 mL | Freq: Once | RESPIRATORY_TRACT | Status: AC
  Administered 2015-07-18: 3 mL via RESPIRATORY_TRACT
  Filled 2015-07-18: qty 3

## 2015-07-18 MED ORDER — ACETAMINOPHEN 325 MG PO TABS: 650.0000 mg | ORAL_TABLET | ORAL | Status: DC | PRN

## 2015-07-18 MED ORDER — LEVOFLOXACIN IN D5W 750 MG/150ML IV SOLN
750.0000 mg | Freq: Once | INTRAVENOUS | Status: AC
  Administered 2015-07-18: 750 mg via INTRAVENOUS
  Filled 2015-07-18: qty 150

## 2015-07-18 MED ORDER — IPRATROPIUM-ALBUTEROL 0.5-2.5 (3) MG/3ML IN SOLN
3.0000 mL | RESPIRATORY_TRACT | Status: DC
  Administered 2015-07-18 – 2015-07-19 (×6): 3 mL via RESPIRATORY_TRACT
  Filled 2015-07-18 (×8): qty 3

## 2015-07-18 MED ORDER — METHYLPREDNISOLONE SODIUM SUCC 125 MG IJ SOLR
125.0000 mg | Freq: Once | INTRAMUSCULAR | Status: AC
  Administered 2015-07-18: 125 mg via INTRAVENOUS
  Filled 2015-07-18: qty 2

## 2015-07-18 MED ORDER — FAMOTIDINE 20 MG PO TABS
20.0000 mg | ORAL_TABLET | Freq: Every day | ORAL | Status: DC
  Administered 2015-07-18 – 2015-07-22 (×5): 20 mg via ORAL
  Filled 2015-07-18 (×5): qty 1

## 2015-07-18 MED ORDER — DEXTROSE 5 % IV SOLN
1.0000 g | INTRAVENOUS | Status: DC
  Filled 2015-07-18: qty 10

## 2015-07-18 MED ORDER — IOPAMIDOL (ISOVUE-370) INJECTION 76%
100.0000 mL | Freq: Once | INTRAVENOUS | Status: AC | PRN
  Administered 2015-07-18: 100 mL via INTRAVENOUS

## 2015-07-18 MED ORDER — FUROSEMIDE 10 MG/ML IJ SOLN
60.0000 mg | Freq: Once | INTRAMUSCULAR | Status: AC
  Administered 2015-07-18: 60 mg via INTRAVENOUS
  Filled 2015-07-18: qty 8

## 2015-07-18 MED ORDER — LEVOTHYROXINE SODIUM 100 MCG PO TABS
200.0000 ug | ORAL_TABLET | Freq: Every day | ORAL | Status: DC
  Administered 2015-07-19 – 2015-07-22 (×4): 200 ug via ORAL
  Filled 2015-07-18: qty 2
  Filled 2015-07-18: qty 1
  Filled 2015-07-18 (×3): qty 2

## 2015-07-18 MED ORDER — ONDANSETRON HCL 4 MG/2ML IJ SOLN: 4.0000 mg | Freq: Four times a day (QID) | INTRAMUSCULAR | Status: DC | PRN

## 2015-07-18 MED ORDER — GUAIFENESIN-CODEINE 100-10 MG/5ML PO SOLN
10.0000 mL | ORAL | Status: DC | PRN
  Administered 2015-07-19 – 2015-07-21 (×7): 10 mL via ORAL
  Filled 2015-07-18 (×7): qty 10

## 2015-07-18 MED ORDER — SODIUM CHLORIDE 0.9% FLUSH: 3.0000 mL | INTRAVENOUS | Status: DC | PRN

## 2015-07-18 MED ORDER — FAMOTIDINE IN NACL 20-0.9 MG/50ML-% IV SOLN
20.0000 mg | Freq: Two times a day (BID) | INTRAVENOUS | Status: DC
  Filled 2015-07-18 (×2): qty 50

## 2015-07-18 MED ORDER — ENOXAPARIN SODIUM 40 MG/0.4ML ~~LOC~~ SOLN
30.0000 mg | SUBCUTANEOUS | Status: DC
  Administered 2015-07-18: 30 mg via SUBCUTANEOUS
  Filled 2015-07-18: qty 0.4

## 2015-07-18 MED ORDER — AZITHROMYCIN 500 MG IV SOLR
500.0000 mg | INTRAVENOUS | Status: DC
  Administered 2015-07-19 – 2015-07-20 (×2): 500 mg via INTRAVENOUS
  Filled 2015-07-18 (×3): qty 500

## 2015-07-18 MED ORDER — SODIUM CHLORIDE 0.9% FLUSH
3.0000 mL | Freq: Two times a day (BID) | INTRAVENOUS | Status: DC
  Administered 2015-07-18 – 2015-07-22 (×8): 3 mL via INTRAVENOUS

## 2015-07-18 NOTE — ED Notes (Signed)
Reports cough and wheezing x 1 wk, taking meds from primary MD without improvement. sats improved from 62% to 93% on 3 l.  Edema to bilat legs, skin w/d.

## 2015-07-18 NOTE — H&P (Signed)
[place] [facility] Consultation      Name: Hannah Powell MRN: [id number] DOB: May 03, 1945    ADMISSION DATE:  07/18/2015   CHIEF COMPLAINT:   resp distress, cough   HISTORY OF PRESENT ILLNESS  70 yo white female seen in ER for progressive coughing and wheezing for  Last several days, patient is former smoker quit 1974 Husband at bedside.  Patient placed on BiPAP, wheezing b/l able to speak but really SOB Patient with pH 7.29/95  Patient not moving any air,  SHe is able to provide limited ROS   PAST MEDICAL HISTORY    :  Past Medical History  Diagnosis Date  . CHF (congestive heart failure) ([place])   . Shortness of breath   . Hypertension     dr Otho Najjar     . Hypothyroidism   . Anxiety   . Arthritis   . RLS (restless legs syndrome)   . Sleep apnea     cpap      >5 yrs   Past Surgical History  Procedure Laterality Date  . Back surgery      cervical  . Joint replacement      x2 tkr  . Foot arthroplasty    . Total shoulder arthroplasty Left 07/02/2013    Procedure: LEFT TOTAL SHOULDER ARTHROPLASTY;  Surgeon: Marin Shutter, MD;  Location: [place];  Service: Orthopedics;  Laterality: Left;   Prior to Admission medications   Medication Sig Start Date End Date Taking? Authorizing Provider  aspirin EC 81 MG tablet Take 81 mg by mouth daily.   Yes Historical Provider, MD  B Complex-C (B-COMPLEX WITH VITAMIN C) tablet Take 1 tablet by mouth daily.   Yes Historical Provider, MD  Calcium Carbonate-Vitamin D (CALCIUM-VITAMIN D) 500-200 MG-UNIT per tablet Take 1 tablet by mouth daily.   Yes Historical Provider, MD  Fluticasone Furoate-Vilanterol (BREO ELLIPTA) 100-25 MCG/INH AEPB Inhale 1 puff into the lungs daily.   Yes Historical Provider, MD  furosemide (LASIX) 40 MG tablet Take 40 mg by mouth 2 (two) times daily as needed for fluid (Takes once or twice daily depending on fluid levels).   Yes Historical Provider, MD  ibuprofen (ADVIL,MOTRIN) 800 MG tablet  Take 800 mg by mouth every 8 (eight) hours as needed.   Yes Historical Provider, MD  levothyroxine (SYNTHROID, LEVOTHROID) 200 MCG tablet Take 200 mcg by mouth daily before breakfast.   Yes Historical Provider, MD  losartan (COZAAR) 25 MG tablet Take 25 mg by mouth daily.   Yes Historical Provider, MD  Omega-3 Fatty Acids (OMEGA 3 PO) Take 1 capsule by mouth daily.   Yes Historical Provider, MD  potassium chloride SA (K-DUR,KLOR-CON) 20 MEQ tablet Take 20 mEq by mouth 2 (two) times daily as needed (for fluid. Takes once of twice daily depending on how many furosemide she takes.).   Yes Historical Provider, MD  pramipexole (MIRAPEX) 1 MG tablet Take 3 mg by mouth at bedtime.   Yes Historical Provider, MD   Allergies  Allergen Reactions  . Lyrica [Pregabalin] Other (See Comments)    Reaction: made Restless Leg Syndrome worse      FAMILY HISTORY   No family history on file.    SOCIAL HISTORY    reports that she quit smoking about 40 years ago. She does not have any smokeless tobacco history on file. She reports that she drinks alcohol. She reports that she does not use illicit drugs.  Review of Systems  Constitutional: Positive for  malaise/fatigue. Negative for fever, chills and weight loss.  Eyes: Negative for blurred vision and double vision.  Respiratory: Positive for cough, shortness of breath and wheezing. Negative for hemoptysis.   Cardiovascular: Negative for chest pain.  Gastrointestinal: Negative for heartburn, nausea, vomiting and abdominal pain.  Genitourinary: Negative for dysuria.  Musculoskeletal: Negative for back pain.  Skin: Negative for rash.  Neurological: Negative for dizziness, tingling and headaches.  Endo/Heme/Allergies: Does not bruise/bleed easily.  Psychiatric/Behavioral: The patient is nervous/anxious.   All other systems reviewed and are negative.     VITAL SIGNS    Temp:  [98.2 F (36.8 C)] 98.2 F (36.8 C) (05/01 0909) Pulse Rate:  [88-97] 90  (05/01 1241) Resp:  [18-26] 18 (05/01 1241) BP: (112-154)/(62-74) 112/65 mmHg (05/01 1241) SpO2:  [64 %-96 %] 91 % (05/01 1241) Weight:  [268 lb (121.564 kg)] 268 lb (121.564 kg) (05/01 0909) HEMODYNAMICS:   VENTILATOR SETTINGS:   INTAKE / OUTPUT:  Intake/Output Summary (Last 24 hours) at 07/18/15 1325 Last data filed at 07/18/15 1325  Gross per 24 hour  Intake      0 ml  Output    600 ml  Net   -600 ml       PHYSICAL EXAM   Physical Exam  Constitutional: She is oriented to person, place, and time. She appears well-developed and well-nourished. No distress.  HENT:  Head: Normocephalic and atraumatic.  Mouth/Throat: No oropharyngeal exudate.  Eyes: EOM are normal. Pupils are equal, round, and reactive to light. No scleral icterus.  Neck: Normal range of motion. Neck supple.  Cardiovascular: Normal rate, regular rhythm and normal heart sounds.   No murmur heard. Pulmonary/Chest: No stridor. She is in respiratory distress. She has wheezes.  Abdominal: Soft. Bowel sounds are normal. She exhibits no distension.  Musculoskeletal: Normal range of motion. She exhibits no edema.  Neurological: She is alert and oriented to person, place, and time. No cranial nerve deficit.  Skin: Skin is warm. She is not diaphoretic.  Psychiatric: She has a normal mood and affect.       LABS   LABS:  CBC  Recent Labs Lab 07/18/15 0925  WBC 7.9  HGB 14.7  HCT 46.4  PLT 195   Coag's No results for input(s): APTT, INR in the last 168 hours. BMET  Recent Labs Lab 07/18/15 0925  NA 143  K 3.7  CL 97*  CO2 39*  BUN 27*  CREATININE 1.07*  GLUCOSE 134*   Electrolytes  Recent Labs Lab 07/18/15 0925  CALCIUM 8.8*   Sepsis Markers No results for input(s): LATICACIDVEN, PROCALCITON, O2SATVEN in the last 168 hours. ABG  Recent Labs Lab 07/18/15 1059 07/18/15 1253  PHART 7.28* 7.30*  PCO2ART 95* 79*  PO2ART 70* 54*   Liver Enzymes  Recent Labs Lab 07/18/15 0925    AST 53*  ALT 74*  ALKPHOS 107  BILITOT 0.7  ALBUMIN 3.7   Cardiac Enzymes  Recent Labs Lab 07/18/15 0925  TROPONINI 0.03   Glucose No results for input(s): GLUCAP in the last 168 hours.   No results found for this or any previous visit (from the past 240 hour(s)).   Current facility-administered medications:  .  0.9 %  sodium chloride infusion, 250 mL, Intravenous, PRN, Flora Lipps, MD .  0.9 %  sodium chloride infusion, 250 mL, Intravenous, PRN, Flora Lipps, MD .  acetaminophen (TYLENOL) tablet 650 mg, 650 mg, Oral, Q4H PRN, Flora Lipps, MD .  budesonide (PULMICORT) nebulizer solution 0.5  mg, 0.5 mg, Nebulization, BID, Flora Lipps, MD .  enoxaparin (LOVENOX) injection 30 mg, 30 mg, Subcutaneous, Q24H, Flora Lipps, MD .  famotidine (PEPCID) IVPB 20 mg premix, 20 mg, Intravenous, Q12H, Flora Lipps, MD .  famotidine (PEPCID) tablet 20 mg, 20 mg, Oral, Daily, Flora Lipps, MD .  ipratropium-albuterol (DUONEB) 0.5-2.5 (3) MG/3ML nebulizer solution 3 mL, 3 mL, Nebulization, Q4H, Flora Lipps, MD .  levofloxacin (LEVAQUIN) IVPB 750 mg, 750 mg, Intravenous, Once, Schuyler Amor, MD, Last Rate: 100 mL/hr at 07/18/15 1227, 750 mg at 07/18/15 1227 .  methylPREDNISolone sodium succinate (SOLU-MEDROL) 40 mg/mL injection 40 mg, 40 mg, Intravenous, Q12H, Flora Lipps, MD .  ondansetron (ZOFRAN) injection 4 mg, 4 mg, Intravenous, Q6H PRN, Flora Lipps, MD .  sodium chloride flush (NS) 0.9 % injection 3 mL, 3 mL, Intravenous, Q12H, Flora Lipps, MD .  sodium chloride flush (NS) 0.9 % injection 3 mL, 3 mL, Intravenous, PRN, Flora Lipps, MD  Current outpatient prescriptions:  .  aspirin EC 81 MG tablet, Take 81 mg by mouth daily., Disp: , Rfl:  .  B Complex-C (B-COMPLEX WITH VITAMIN C) tablet, Take 1 tablet by mouth daily., Disp: , Rfl:  .  Calcium Carbonate-Vitamin D (CALCIUM-VITAMIN D) 500-200 MG-UNIT per tablet, Take 1 tablet by mouth daily., Disp: , Rfl:  .  Fluticasone Furoate-Vilanterol  (BREO ELLIPTA) 100-25 MCG/INH AEPB, Inhale 1 puff into the lungs daily., Disp: , Rfl:  .  furosemide (LASIX) 40 MG tablet, Take 40 mg by mouth 2 (two) times daily as needed for fluid (Takes once or twice daily depending on fluid levels)., Disp: , Rfl:  .  ibuprofen (ADVIL,MOTRIN) 800 MG tablet, Take 800 mg by mouth every 8 (eight) hours as needed., Disp: , Rfl:  .  levothyroxine (SYNTHROID, LEVOTHROID) 200 MCG tablet, Take 200 mcg by mouth daily before breakfast., Disp: , Rfl:  .  losartan (COZAAR) 25 MG tablet, Take 25 mg by mouth daily., Disp: , Rfl:  .  Omega-3 Fatty Acids (OMEGA 3 PO), Take 1 capsule by mouth daily., Disp: , Rfl:  .  potassium chloride SA (K-DUR,KLOR-CON) 20 MEQ tablet, Take 20 mEq by mouth 2 (two) times daily as needed (for fluid. Takes once of twice daily depending on how many furosemide she takes.)., Disp: , Rfl:  .  pramipexole (MIRAPEX) 1 MG tablet, Take 3 mg by mouth at bedtime., Disp: , Rfl:   IMAGING    Dg Chest 2 View  07/18/2015  CLINICAL DATA:  Productive cough and shortness of breath for 1 week, history of CHF and hypertension, former smoker EXAM: CHEST  2 VIEW COMPARISON:  04/152016 FINDINGS: Enlargement of cardiac silhouette with pulmonary vascular congestion. Tortuosity of thoracic aorta. Chronic bronchitic changes with bibasilar atelectasis. Mild chronic accentuation of interstitial markings without acute infiltrate, pleural effusion, or pneumothorax. Bones demineralized. LEFT shoulder prosthesis. IMPRESSION: Enlargement of cardiac silhouette with pulmonary vascular congestion. Chronic bronchitic changes with bibasilar atelectasis. No definite acute infiltrate. Electronically Signed   By: Lavonia Dana M.D.   On: 07/18/2015 10:21   Ct Angio Chest Pe W/cm &/or Wo Cm  07/18/2015  CLINICAL DATA:  Low oxygen saturations and shortness of breath. EXAM: CT ANGIOGRAPHY CHEST WITH CONTRAST TECHNIQUE: Multidetector CT imaging of the chest was performed using the standard  protocol during bolus administration of intravenous contrast. Multiplanar CT image reconstructions and MIPs were obtained to evaluate the vascular anatomy. CONTRAST:  100 mL Isovue 370 COMPARISON:  Chest CTA 07/05/2014 and 01/01/2013 FINDINGS: Mediastinum/Lymph  Nodes: No evidence for pulmonary embolism. Main pulmonary artery is enlarged measuring up to 4.2 cm. Small amount of fluid in superior pericardial recess. No suspicious chest lymphadenopathy. Lungs/Pleura: Trachea and mainstem bronchi are patent. 9 mm x 9 mm noncalcified nodule in the left lower lobe on sequence 6, image 90. This nodule has not significantly changed since 01/01/2013. Patchy parenchymal densities along the dependent aspect the lower lobes bilaterally. Small amount atelectasis in the posterior upper lobes bilaterally. There appears to be some scarring in left lower lobe. There appears to be some trace right pleural fluid. Upper abdomen: Question exophytic structure involving the anterior aspect of the left kidney on sequence 4, image 142. However, this is the last image and could be related to normal parenchyma. This area is incompletely evaluated. Otherwise, images of the upper abdomen are unremarkable. Musculoskeletal: Stable appearance of the anterior fixation plate and screws in the lower cervical spine. Again noted is severe compression deformity involving T7 vertebral body. Multilevel degenerative changes in the thoracic spine. Hemiarthroplasty of the left shoulder. Prominent subchondral cyst formations in the glenoids bilaterally. Review of the MIP images confirms the above findings. IMPRESSION: Negative for pulmonary embolism. Patchy parenchymal densities in the dependent aspect of the lower lobes bilaterally. This likely represents a combination of scarring and atelectasis. Trace pleural fluid. Stable 9 mm nodule in the left lower lobe. There has been minimal change since 2014 suggesting that this is likely a benign etiology. Consider  a 1 year follow-up to ensure stability. Question an exophytic area involving the anterior aspect of the left kidney but this area is incompletely evaluated and could represent normal parenchyma. Consider follow-up renal ultrasound to exclude a renal lesion. Electronically Signed   By: Markus Daft M.D.   On: 07/18/2015 11:58        ASSESSMENT/PLAN   70 yo white female seen today for acute and severe hypercapnic resp failure from acute COPD exacerbation  from acute and probable pneumonia   Pulmonary -Respiratory Failure Oxygen and biPAP as needed -aggressive BD therapy -iv steroids and IV abx -high risk of failing   CARDIOVASCULAR Needs SD/ICU monitoring -  GASTROINTESTINAL Gi prx, keep NPO  HEMATOLOGIC follow CBC  INFECTIOUS Empiric abx therapy -rocephin and asithromycin     I have personally obtained a history, examined the patient, evaluated laboratory and independently reviewed  imaging results, formulated the assessment and plan and placed orders.  The Patient requires high complexity decision making for assessment and support, frequent evaluation and titration of therapies, application of advanced monitoring technologies and extensive interpretation of multiple databases. Critical Care Time devoted to patient care services described in this note is 45 minutes.   Overall, patient is critically ill, prognosis is guarded.   Corrin Parker, M.D.  [facility]  Medical Director [place] Director [facility] Cardio-Pulmonary Department

## 2015-07-18 NOTE — ED Notes (Signed)
AAOx3.  Skin warm and dry.  Ambulated with easy and steady gait.  Tolerated well.

## 2015-07-18 NOTE — ED Notes (Signed)
OOB to [facility].  Remained on Bipap.  Tolerated well.  Continue to monitor.

## 2015-07-18 NOTE — Progress Notes (Signed)
Pharmacy Antibiotic Note  Hannah Powell is a 70 y.o. female admitted on 07/18/2015 with pneumonia.  Pharmacy has been consulted for ceftriaxone and azithromycin dosing.  Plan: Ceftriaxone 1 g IV daily Azithromycin 500 mg IV daily (to begin tomorrow since patient received levofloxacin this afternoon in ED and they need to be separated by 24 hours to reduce risk of QTc prolongation)  Height: 5\' 5"  (165.1 cm) Weight: 268 lb (121.564 kg) IBW/kg (Calculated) : 57  Temp (24hrs), Avg:98.2 F (36.8 C), Min:98.2 F (36.8 C), Max:98.2 F (36.8 C)   Recent Labs Lab 07/18/15 0925  WBC 7.9  CREATININE 1.07*    Estimated Creatinine Clearance: 65.8 mL/min (by C-G formula based on Cr of 1.07).    Allergies  Allergen Reactions  . Lyrica [Pregabalin] Other (See Comments)    Reaction: made Restless Leg Syndrome worse    Antimicrobials this admission: ceftriaxone 5/1 >>  azithromycin 5/1 >>  Levofloxacin one dose in ED on 5/1  Dose adjustments this admission:  Microbiology results: 5/1 BCx: Sent  Thank you for allowing pharmacy to be a part of this patient's care.  Lenis Noon, PharmD Clinical Pharmacist 07/18/2015 1:34 PM

## 2015-07-18 NOTE — ED Provider Notes (Addendum)
[facility] Emergency Department Provider Note  ____________________________________________   I have reviewed the triage vital signs and the nursing notes.   HISTORY  Chief Complaint Shortness of Breath    HPI Hannah Powell is a 70 y.o. female who does not she state have a history of COPD, has a history of Lasix use but denies history of CHF states that she uses her Lasix "as needed" for high blood pressure and has not been taking it recently because her blood pressure has been low. Patient states that she has had a wheeze and a cough which is productive for the last 2 weeks. She has seen her doctor and had steroids as well as albuterol and antibiotics but does not feel much better. She has had several syncopal events. Most recent was 4 days ago. No seizure. She wants passed out for about "10 seconds". She denies any focal neurologic deficit. She states one week ago at her primary care doctor her oxygen saturation was 90 and she does have a home pulse ox machine and his regularly been in the upper 80s but sometimes below that recently. No personal or family history of PE or DVT she has chronic leg swelling and after knee surgery left sometimes is greater than the right. She went to see Dr. Brigitte Pulse this morning and it was noted that her oxygen saturation was in the 60s, she was sent here by private vehicle. On arrival, patient sats are 64% however we have given her oxygen and she is now in the mid 90s feeling better. She denies any chest pain, she denies any significant injury from her fall she did bump her cheek once. Usually she decides out of a chair according to her family member.   Past Medical History  Diagnosis Date  . CHF (congestive heart failure) ([place])   . Shortness of breath   . Hypertension     dr Otho Najjar     . Hypothyroidism   . Anxiety   . Arthritis   . RLS (restless legs syndrome)   . Sleep apnea     cpap      >5 yrs    Patient Active Problem  List   Diagnosis Date Noted  . S/P shoulder replacement 07/02/2013    Past Surgical History  Procedure Laterality Date  . Back surgery      cervical  . Joint replacement      x2 tkr  . Foot arthroplasty    . Total shoulder arthroplasty Left 07/02/2013    Procedure: LEFT TOTAL SHOULDER ARTHROPLASTY;  Surgeon: Marin Shutter, MD;  Location: [facility];  Service: Orthopedics;  Laterality: Left;    Current Outpatient Rx  Name  Route  Sig  Dispense  Refill  . ALPRAZolam (XANAX) 0.5 MG tablet   Oral   Take 0.5 mg by mouth daily as needed for anxiety. Reported on 03/23/2015         . aspirin EC 81 MG tablet   Oral   Take 81 mg by mouth daily.         . B Complex-C (B-COMPLEX WITH VITAMIN C) tablet   Oral   Take 1 tablet by mouth daily.         . Calcium Carbonate-Vitamin D (CALCIUM-VITAMIN D) 500-200 MG-UNIT per tablet   Oral   Take 1 tablet by mouth daily.         . diazepam (VALIUM) 5 MG tablet   Oral   Take 0.5-1 tablets (  2.5-5 mg total) by mouth every 6 (six) hours as needed for muscle spasms or sedation. Patient not taking: Reported on 03/23/2015   40 tablet   1   . Fluticasone Furoate-Vilanterol (BREO ELLIPTA) 100-25 MCG/INH AEPB   Inhalation   Inhale 1 puff into the lungs daily.         . furosemide (LASIX) 40 MG tablet   Oral   Take 40 mg by mouth 2 (two) times daily as needed for fluid (Takes once or twice daily depending on fluid levels).         Marland Kitchen HYDROcodone-acetaminophen (NORCO) 7.5-325 MG per tablet   Oral   Take 1-2 tablets by mouth every 4 (four) hours as needed for moderate pain. Patient not taking: Reported on 03/23/2015   60 tablet   0   . ibuprofen (ADVIL,MOTRIN) 800 MG tablet   Oral   Take 800 mg by mouth every 8 (eight) hours as needed.         Marland Kitchen levothyroxine (SYNTHROID, LEVOTHROID) 200 MCG tablet   Oral   Take 200 mcg by mouth daily before breakfast.         . losartan (COZAAR) 25 MG tablet   Oral   Take 25 mg by mouth daily.          . Omega-3 Fatty Acids (OMEGA 3 PO)   Oral   Take 1 capsule by mouth daily.         . potassium chloride SA (K-DUR,KLOR-CON) 20 MEQ tablet   Oral   Take 20 mEq by mouth 2 (two) times daily as needed (for fluid. Takes once of twice daily depending on how many furosemide she takes.).         Marland Kitchen pramipexole (MIRAPEX) 1 MG tablet   Oral   Take 3 mg by mouth at bedtime.           Allergies Review of patient's allergies indicates no known allergies.  No family history on file.  Social History Social History  Substance Use Topics  . Smoking status: Former Smoker -- 1.00 packs/day for 10 years    Quit date: 03/20/1975  . Smokeless tobacco: Not on file  . Alcohol Use: Yes     Comment: occ    Review of Systems Constitutional: No fever/chills Eyes: No visual changes. ENT: No sore throat. No stiff neck no neck pain Cardiovascular: Denies chest pain. Respiratory: Positive shortness of breath. Gastrointestinal:   no vomiting.  No diarrhea.  No constipation. Genitourinary: Negative for dysuria. Musculoskeletal: Negative for change in chronic lower extremity swelling Skin: Negative for rash. Neurological: Negative for headaches, focal weakness or numbness. 10-point ROS otherwise negative.  ____________________________________________   PHYSICAL EXAM:  VITAL SIGNS: ED Triage Vitals  Enc Vitals Group     BP 07/18/15 0909 127/66 mmHg     Pulse Rate 07/18/15 0909 93     Resp 07/18/15 0909 26     Temp 07/18/15 0909 98.2 F (36.8 C)     Temp Source 07/18/15 0909 Oral     SpO2 07/18/15 0909 64 %     Weight 07/18/15 0909 268 lb (121.564 kg)     Height 07/18/15 0909 5\' 5"  (1.651 m)     Head Cir --      Peak Flow --      Pain Score 07/18/15 0909 0     Pain Loc --      Pain Edu? --      Excl. in [place]? --  Constitutional:Patient remarkedly well-appearing despite oxygen saturation, not in respiratory distress Eyes: Conjunctivae are normal. PERRL. EOMI. Head:  Atraumatic. Nose: No congestion/rhinnorhea. Mouth/Throat: Mucous membranes are moist.  Oropharynx non-erythematous. Neck: No stridor.   Nontender with no meningismus Cardiovascular: Normal rate, regular rhythm. Grossly normal heart sounds.  Good peripheral circulation. Respiratory: No significant increased work of breathing mild tachypnea noted, very diminished in the bases, tight Abdominal: Soft and nontender. No distention. No guarding no rebound Back:  There is no focal tenderness or step off there is no midline tenderness there are no lesions noted. there is no CVA tenderness Musculoskeletal: No lower extremity tenderness. No joint effusions, no DVT signs strong distal pulses 2+ bilateral pitting edema Neurologic:  Normal speech and language. No gross focal neurologic deficits are appreciated.  Skin:  Skin is warm, dry and intact. No rash noted. Psychiatric: Mood and affect are normal. Speech and behavior are normal.  ____________________________________________   LABS (all labs ordered are listed, but only abnormal results are displayed)  Labs Reviewed  CULTURE, BLOOD (ROUTINE X 2)  CULTURE, BLOOD (ROUTINE X 2)  COMPREHENSIVE METABOLIC PANEL  CBC WITH DIFFERENTIAL/PLATELET  TROPONIN I  BRAIN NATRIURETIC PEPTIDE  BLOOD GAS, ARTERIAL   ____________________________________________  EKG  I personally interpreted any EKGs ordered by me or triage Sinus rhythm rate 89 bpm no acute ST elevation or depression, RAD noted ____________________________________________  RADIOLOGY  I reviewed any imaging ordered by me or triage that were performed during my shift and, if possible, patient and/or family made aware of any abnormal findings. ____________________________________________   PROCEDURES  Procedure(s) performed: None  Critical Care performed: CRITICAL CARE Performed by: Schuyler Amor   Total critical care time: 50 minutes  Critical care time was exclusive of  separately billable procedures and treating other patients.  Critical care was necessary to treat or prevent imminent or life-threatening deterioration.  Critical care was time spent personally by me on the following activities: development of treatment plan with patient and/or surrogate as well as nursing, discussions with consultants, evaluation of patient's response to treatment, examination of patient, obtaining history from patient or surrogate, ordering and performing treatments and interventions, ordering and review of laboratory studies, ordering and review of radiographic studies, pulse oximetry and re-evaluation of patient's condition.   ____________________________________________   INITIAL IMPRESSION / ASSESSMENT AND PLAN / ED COURSE  Pertinent labs & imaging results that were available during my care of the patient were reviewed by me and considered in my medical decision making (see chart for details).  Patient with significant hypoxia the context of cough and wheeze which she describes as "allergies". She arrives by private vehicle with a sat of 64%. However, her sats rapidly improve with supplemental oxygen at this time she is at 95%. We will obtain an ABG of BNP at chest x-ray, EKG doesn't show any acute ischemia she has had no chest pain. We will obtain a troponin tissue that does not represent occult ischemia, we will obtain basic blood work and we will give her an albuterol treatment steroids and reassessed. Could be CHF could be COPD or pneumonia, could be pulmonary embolism. The cough and wheeze however makes me suspect less than his PE ____________________________________________   FINAL CLINICAL IMPRESSION(S) / ED DIAGNOSES  Final diagnoses:  None      This chart was dictated using voice recognition software.  Despite best efforts to proofread,  errors can occur which can change meaning.     Schuyler Amor, MD 07/18/15  [id number]  Schuyler Amor, MD 07/18/15  [phone]

## 2015-07-18 NOTE — ED Notes (Signed)
Pt sent here from Dr Manuella Ghazi office due to low oxygen saturation; pt's oxygen sat is 64% on room air in triage. Pt reports some shortness of breath, denies chest pain at present time. Pt alert and oriented; placed on 4L via [place].

## 2015-07-18 NOTE — Progress Notes (Signed)
*  PRELIMINARY RESULTS* Echocardiogram 2D Echocardiogram has been performed.  Hannah Powell 07/18/2015, 7:58 PM

## 2015-07-19 ENCOUNTER — Inpatient Hospital Stay: Payer: Commercial Managed Care - HMO

## 2015-07-19 LAB — ECHOCARDIOGRAM COMPLETE
HEIGHTINCHES: 65 in
Weight: 4288 oz

## 2015-07-19 LAB — BASIC METABOLIC PANEL
ANION GAP: 8 (ref 5–15)
BUN: 25 mg/dL — ABNORMAL HIGH (ref 6–20)
CALCIUM: 8.9 mg/dL (ref 8.9–10.3)
CO2: 39 mmol/L — ABNORMAL HIGH (ref 22–32)
Chloride: 98 mmol/L — ABNORMAL LOW (ref 101–111)
Creatinine, Ser: 1.02 mg/dL — ABNORMAL HIGH (ref 0.44–1.00)
GFR, EST NON AFRICAN AMERICAN: 55 mL/min — AB (ref >60)
GLUCOSE: 160 mg/dL — AB (ref 65–99)
POTASSIUM: 4.4 mmol/L (ref 3.5–5.1)
Sodium: 145 mmol/L (ref 135–145)

## 2015-07-19 LAB — CBC
HCT: 47.8 % — ABNORMAL HIGH (ref 35.0–47.0)
Hemoglobin: 15 g/dL (ref 12.0–16.0)
MCH: 30.4 pg (ref 26.0–34.0)
MCHC: 31.3 g/dL — ABNORMAL LOW (ref 32.0–36.0)
MCV: 97.2 fL (ref 80.0–100.0)
PLATELETS: 170 10*3/uL (ref 150–440)
RBC: 4.92 MIL/uL (ref 3.80–5.20)
RDW: 18.8 % — ABNORMAL HIGH (ref 11.5–14.5)
WBC: 6.1 10*3/uL (ref 3.6–11.0)

## 2015-07-19 MED ORDER — FUROSEMIDE 10 MG/ML IJ SOLN
40.0000 mg | Freq: Once | INTRAMUSCULAR | Status: AC
  Administered 2015-07-19: 40 mg via INTRAVENOUS
  Filled 2015-07-19: qty 4

## 2015-07-19 MED ORDER — IPRATROPIUM-ALBUTEROL 0.5-2.5 (3) MG/3ML IN SOLN
3.0000 mL | Freq: Three times a day (TID) | RESPIRATORY_TRACT | Status: DC
  Administered 2015-07-19 – 2015-07-20 (×2): 3 mL via RESPIRATORY_TRACT
  Filled 2015-07-19 (×2): qty 3

## 2015-07-19 MED ORDER — ENOXAPARIN SODIUM 40 MG/0.4ML ~~LOC~~ SOLN
40.0000 mg | Freq: Two times a day (BID) | SUBCUTANEOUS | Status: DC
  Administered 2015-07-19 – 2015-07-22 (×6): 40 mg via SUBCUTANEOUS
  Filled 2015-07-19 (×6): qty 0.4

## 2015-07-19 NOTE — Clinical Documentation Improvement (Signed)
Internal Medicine Critical Care  Please provide diagnosis for BNP 502, if appropriate ?  Thank you     Acuity - Acute, Chronic, Acute on Chronic   Type - Systolic, Diastolic, Systolic and Diastolic  Other  Clinically Undetermined   Document any associated diagnoses/conditions  Supporting information: Hypertension   Treatment: IV Lasix 60 mg x1, then 40 mg BID IV  Evaluation: Echo results pending    Please exercise your independent, professional judgment when responding. A specific answer is not anticipated or expected.   Thank You,  [place] [phone]

## 2015-07-19 NOTE — Consult Note (Signed)
[facility] Consultation     Date: 07/19/2015,   MRN# [id number] Hannah Powell 1945-11-26 Code Status:     Code Status Orders        Start     Ordered   07/18/15 1321  Full code   Continuous     07/18/15 1324    Code Status History    Date Active Date Inactive Code Status Order ID Comments User Context   07/02/2013 11:24 AM 07/03/2013  1:35 PM Full Code [id number]  Jenetta Loges, PA-C Inpatient    Advance Directive Documentation        Most Recent Value   Type of Advance Directive  Living will, Healthcare Power of Attorney   Pre-existing out of facility DNR order (yellow form or pink MOST form)     "MOST" Form in Place?       Hosp day:@LENGTHOFSTAYDAYS @ Referring MD: @ATDPROV @     PCP:      AdmissionWeight: 268 lb (121.564 kg)                 CurrentWeight: 271 lb 13.2 oz (123.3 kg)      CHIEF COMPLAINT:   Acute resp failure-resolved   HISTORY OF PRESENT ILLNESS  SOB SOB resolved, still with cough, echo pending,     Review of Systems  Constitutional: Positive for malaise/fatigue. Negative for fever, chills and weight loss.  HENT: Positive for congestion.   Respiratory: Positive for cough. Negative for hemoptysis, sputum production, shortness of breath and wheezing.   Cardiovascular: Positive for leg swelling. Negative for chest pain, palpitations and orthopnea.  Gastrointestinal: Negative for nausea and vomiting.  Musculoskeletal: Positive for back pain.  Skin: Negative for rash.  Neurological: Negative for dizziness.  Psychiatric/Behavioral: The patient is not nervous/anxious.   All other systems reviewed and are negative.    VS: BP 120/76 mmHg  Pulse 75  Temp(Src) 98.4 F (36.9 C) (Oral)  Resp 18  Ht 5\' 5"  (1.651 m)  Wt 271 lb 13.2 oz (123.3 kg)  BMI 45.23 kg/m2  SpO2 100%     PHYSICAL EXAM   Physical Exam  Constitutional: She is oriented to person, place, and time. She appears well-developed and well-nourished. No  distress.  HENT:  Head: Normocephalic and atraumatic.  Eyes: Pupils are equal, round, and reactive to light.  Neck: Normal range of motion. Neck supple.  Cardiovascular: Normal rate, regular rhythm and normal heart sounds.   No murmur heard. Pulmonary/Chest: No stridor. No respiratory distress. She has no wheezes.  Musculoskeletal: Normal range of motion. She exhibits edema.  Neurological: She is alert and oriented to person, place, and time. No cranial nerve deficit.  Skin: Skin is warm. She is not diaphoretic.  Psychiatric: She has a normal mood and affect.        LABS    Recent Labs     07/18/15  0925  07/19/15  0546  HGB  14.7  15.0  HCT  46.4  47.8*  MCV  95.0  97.2  WBC  7.9  6.1  BUN  27*  25*  CREATININE  1.07*  1.02*  GLUCOSE  134*  160*  CALCIUM  8.8*  8.9  ,           IMAGING    Dg Chest 2 View  07/18/2015  CLINICAL DATA:  Productive cough and shortness of breath for 1 week, history of CHF and hypertension, former smoker EXAM: CHEST  2 VIEW COMPARISON:  04/152016 FINDINGS: Enlargement  of cardiac silhouette with pulmonary vascular congestion. Tortuosity of thoracic aorta. Chronic bronchitic changes with bibasilar atelectasis. Mild chronic accentuation of interstitial markings without acute infiltrate, pleural effusion, or pneumothorax. Bones demineralized. LEFT shoulder prosthesis. IMPRESSION: Enlargement of cardiac silhouette with pulmonary vascular congestion. Chronic bronchitic changes with bibasilar atelectasis. No definite acute infiltrate. Electronically Signed   By: Lavonia Dana M.D.   On: 07/18/2015 10:21   Ct Angio Chest Pe W/cm &/or Wo Cm  07/18/2015  CLINICAL DATA:  Low oxygen saturations and shortness of breath. EXAM: CT ANGIOGRAPHY CHEST WITH CONTRAST TECHNIQUE: Multidetector CT imaging of the chest was performed using the standard protocol during bolus administration of intravenous contrast. Multiplanar CT image reconstructions and MIPs were obtained  to evaluate the vascular anatomy. CONTRAST:  100 mL Isovue 370 COMPARISON:  Chest CTA 07/05/2014 and 01/01/2013 FINDINGS: Mediastinum/Lymph Nodes: No evidence for pulmonary embolism. Main pulmonary artery is enlarged measuring up to 4.2 cm. Small amount of fluid in superior pericardial recess. No suspicious chest lymphadenopathy. Lungs/Pleura: Trachea and mainstem bronchi are patent. 9 mm x 9 mm noncalcified nodule in the left lower lobe on sequence 6, image 90. This nodule has not significantly changed since 01/01/2013. Patchy parenchymal densities along the dependent aspect the lower lobes bilaterally. Small amount atelectasis in the posterior upper lobes bilaterally. There appears to be some scarring in left lower lobe. There appears to be some trace right pleural fluid. Upper abdomen: Question exophytic structure involving the anterior aspect of the left kidney on sequence 4, image 142. However, this is the last image and could be related to normal parenchyma. This area is incompletely evaluated. Otherwise, images of the upper abdomen are unremarkable. Musculoskeletal: Stable appearance of the anterior fixation plate and screws in the lower cervical spine. Again noted is severe compression deformity involving T7 vertebral body. Multilevel degenerative changes in the thoracic spine. Hemiarthroplasty of the left shoulder. Prominent subchondral cyst formations in the glenoids bilaterally. Review of the MIP images confirms the above findings. IMPRESSION: Negative for pulmonary embolism. Patchy parenchymal densities in the dependent aspect of the lower lobes bilaterally. This likely represents a combination of scarring and atelectasis. Trace pleural fluid. Stable 9 mm nodule in the left lower lobe. There has been minimal change since 2014 suggesting that this is likely a benign etiology. Consider a 1 year follow-up to ensure stability. Question an exophytic area involving the anterior aspect of the left kidney but  this area is incompletely evaluated and could represent normal parenchyma. Consider follow-up renal ultrasound to exclude a renal lesion. Electronically Signed   By: Markus Daft M.D.   On: 07/18/2015 11:58   Dg Chest Port 1 View  07/19/2015  CLINICAL DATA:  Cough for 3 weeks. EXAM: PORTABLE CHEST 1 VIEW COMPARISON:  07/18/2015 FINDINGS: Shallow inspiration. Cardiac enlargement with mild vascular congestion. No edema. Probable small bilateral pleural effusions with atelectasis in the lung bases. Tortuous aorta. Postoperative changes in the cervical spine and left shoulder. No pneumothorax. IMPRESSION: Cardiac enlargement with mild pulmonary vascular congestion. Bilateral small pleural effusions and atelectasis in the lung bases. Electronically Signed   By: Lucienne Capers M.D.   On: 07/19/2015 06:35       ASSESSMENT/PLAN    70 yo white female seen today for acute and severe hypercapnic resp failure from acute COPD exacerbation from acute and probable pneumonia   Pulmonary -Respiratory Failure-resolved Oxygen and biPAP at night for OSA -aggressive BD therapy -wean off steroids, off ABX now-watch for fevers -high risk of  failing   CARDIOVASCULAR Ok to transfer to gen med floor -ECHO pending Lasix as tolerated Patient with h/o CHF  GASTROINTESTINAL Advance diet  INFECTIOUS Hold BAX for now   Needs PT/OT and transfer to gen med floor, Dr. Judeen Hammans notified of transfer to gen med floor and transfer of service   The Patient requires high complexity decision making for assessment and support, frequent evaluation and titration of therapies, application of advanced monitoring technologies and extensive interpretation of multiple databases.  Patient satisfied with Plan of action and management. All questions answered   Corrin Parker, M.D.  [facility]  Medical Director [place] Director [facility] Cardio-Pulmonary Department

## 2015-07-19 NOTE — Progress Notes (Signed)
Vital signs stable all shift. Patient up in chair with minimal assist. Patient gets up to [facility] to use the bathroom. Lungs sounds clear and diminished. On 2L O2 via . O2 Sats at 90-100%. No complaints of pain this shift. Patient complains of cough and given PRN medication. Followed up with patient and reports that the cough has decreased and medicine was effective. Patient on a low-salt diet. Will continue to monitor patient.  Hannah Powell

## 2015-07-19 NOTE — Progress Notes (Signed)
Pt requesting to take a shower, nurse advised pt that due to her respiratory distress and poor activity tolerance a shower is not recommended. Pt insisting. MD Paulene Floor notified , per MD pt should not take a shower at this time. Pt notified of MD order. Pt verbalized understanding.

## 2015-07-19 NOTE — Progress Notes (Signed)
Dr. Mortimer Fries gave order for patient to be moved to any floor without tele monitoring.

## 2015-07-19 NOTE — Evaluation (Signed)
Occupational Therapy Evaluation Patient Details Name: Hannah Powell MRN: [id number] DOB: 1945-06-10 Today's Date: 07/19/2015    History of Present Illness patient is 70 year old Caucasian female with past medical history significant for history of CHF, shortness of breath, hypertension, obstructive sleep apnea, pulmonary tobacco abuse, who presents to the hospital with cough, greenish phlegm production, shortness of breath worsening over period of few weeks prior to coming to the hospital, associated with lower extremity swelling. On arrival to the hospital. Patient's labs revealed hypercarbic hypoxic respiratory failure, mild renal insufficiency. Patient's initial chest x-ray revealed enlargement of cardiac silhouette and pulmonary vascular congestion, bibasilar atelectasis. Patient was initiated on broad-spectrum antibiotic therapy with Rocephin and Zithromax intravenously nebulizing therapy, Lasix, BiPAP was initiated and patient was admitted to intensive care unit. With conservative therapy. Her condition improved and she was weaned off BiPAP, now she is on 2 L of oxygen through nasal cannula and feels comfortable. She diuresed approximately 1.8 L during the stay in the hospital time. She admitted of orthopnea prior to coming to the hospital, which resolved with diuresis    Clinical Impression   Pt is  70 year old woman who presents to [facility] hospital with SOB that was getting worse over several weeks with LE swelling.  She presents with decreased airway exchange during conversation and when talking for more than a few sentences, she has to cough.  She was knowledgeable about energy conservation tech and states she has been using pacing, purse lip breathing, task analysis to spread out duties to do each day but would benefit from using the pursed lip breathing during ADls.  She requires min assist for LB dressing tasks with several rest breaks and would benefit from instruction in use of assistive  devices to decrease exertion and minimize SOB.  She has a fully remodeled bathroom with handicap height toilet and built in seat for shower with grab bars for both areas.  She and her husband order food from local restaurants for meals and don't cook except basic breakfast items.  She would benefit from continued OT services while in hospital for further instruction in energy conservation tech and education in adaptive equipment for LB dressing skills.  She requires assist for ADLs due to SOB, decreased endurance for functional tasks and at risk for falls.  Pt will most likely not need any further OT services but will continue to monitor progress.     Follow Up Recommendations  No OT follow up    Equipment Recommendations       Recommendations for Other Services       Precautions / Restrictions Precautions Precautions: Fall Restrictions Weight Bearing Restrictions: Yes Other Position/Activity Restrictions: WBAT      Mobility Bed Mobility                  Transfers                      Balance                                            ADL Overall ADL's : Needs assistance/impaired Eating/Feeding: Independent;Set up   Grooming: Wash/dry hands;Wash/dry face;Oral care;Sitting;Set up               Lower Body Dressing: Minimal assistance;Cueing for compensatory techniques Lower Body Dressing Details (indicate cue type and reason):  (  rest breaks for SOB and rec using AD)               General ADL Comments: Pt is limited in ADLs due to decreased endurance, SOB and need for rest breaks.  Increased SOB with exertion and leaning forward and able to state back endergy conserv tech but not able to demonstrate during ADLs due to fatigue.  Rec using AD for LB dressing when SOB increases and to prevent further SOB and left handout about adaptive devices to review and consider for ADLs.     Vision     Perception     Praxis      Pertinent  Vitals/Pain Pain Assessment: No/denies pain     Hand Dominance Right   Extremity/Trunk Assessment Upper Extremity Assessment Upper Extremity Assessment: Overall WFL for tasks assessed;Generalized weakness (BUEs and hands weak but full ROM with carpal tunnel in R hand per pt report with minimal numbness noted)   Lower Extremity Assessment Lower Extremity Assessment: Defer to PT evaluation       Communication Communication Communication: No difficulties   Cognition Arousal/Alertness: Awake/alert Behavior During Therapy: WFL for tasks assessed/performed Overall Cognitive Status: Within Functional Limits for tasks assessed                     General Comments       Exercises       Shoulder Instructions      Home Living Family/patient expects to be discharged to:: Private residence Living Arrangements: Spouse/significant other Available Help at Discharge: Family;Available 24 hours/day Type of Home: House Home Access: Stairs to enter [employer] of Steps: 2   Home Layout: One level     Bathroom Shower/Tub: Occupational psychologist: Handicapped height Bathroom Accessibility: Yes How Accessible: Accessible via walker Home Equipment: [place] - 2 wheels;Cane - single point;Shower seat - built in;Bedside commode          Prior Functioning/Environment Level of Independence: Independent with assistive device(s)        Comments: SPC as needed but was mostly walking without AD for ADLs    OT Diagnosis: Generalized weakness;Other (comment) (long standing SOB and decreased stamina)   OT Problem List: Decreased activity tolerance;Decreased strength;Cardiopulmonary status limiting activity;Other (comment) (needs education in energy conservation tech)   OT Treatment/Interventions: Self-care/ADL training;Energy conservation;Patient/family education;Therapeutic activities    OT Goals(Current goals can be found in the care plan section) Acute Rehab  OT Goals Patient Stated Goal: "to keep getting better so I can go home" OT Goal Formulation: With patient/family Time For Goal Achievement: 08/02/15 Potential to Achieve Goals: Good ADL Goals Pt Will Perform Lower Body Dressing: with min assist;with adaptive equipment;sit to/from stand (rec using reacher and sock aid to decrease SOB and exertion) Pt Will Perform Toileting - Clothing Manipulation and hygiene: with min assist;sit to/from stand Additional ADL Goal #1: Pt will be educated and able to demonstrate energy conservation tech for ADLs  OT Frequency: Min 1X/week   Barriers to D/C:            Co-evaluation              End of Session Nurse Communication:  (to clear for evaluation and clarify mobility status and not on bed rest any longer)  Activity Tolerance: Patient limited by fatigue Patient left: in chair;with chair alarm set;with call bell/phone within reach   Time: 1430-1459 OT Time Calculation (min): 29 min Charges:  OT General Charges $OT Visit: 1  Procedure OT Evaluation $OT Eval Moderate Complexity: 1 Procedure OT Treatments $Self Care/Home Management : 8-22 mins G-Codes:     Chrys Racer, OTR/L ascom [phone] 07/19/2015, 3:24 PM

## 2015-07-19 NOTE — Plan of Care (Signed)
Problem: Safety: Goal: Ability to remain free from injury will improve Outcome: Completed/Met Date Met:  07/19/15 Pt remains free from falls. All walkways clear and assessed prior to ambulating. Fall risk signage hanging in room. Patient verbalizes when needing to go to the restroom.     

## 2015-07-19 NOTE — Care Management (Signed)
Respiratory sx related to CHF per attending.  May benefit from referral to heart failure clinic. Notified clinic of potential referral.  Physical therapy and occupational therapy are not recommending any home health therapy.  Patient was receiving outpatient physical therapy prior to admission and should resume.  Will need to be assessed for need of home 02

## 2015-07-19 NOTE — Progress Notes (Signed)
[facility] [facility] [place] NAME: Hannah Powell    MR#:  [id number]  DATE OF BIRTH:  07-Jul-1945  SUBJECTIVE:  CHIEF COMPLAINT:   Chief Complaint  Patient presents with  . Shortness of Breath   patient is 70 year old Caucasian female with past medical history significant for history of CHF, shortness of breath, hypertension, obstructive sleep apnea, pulmonary tobacco abuse, who presents to the hospital with cough, greenish phlegm production, shortness of breath worsening over period of few weeks prior to coming to the hospital, associated with lower extremity swelling. On arrival to the hospital. Patient's labs revealed hypercarbic hypoxic respiratory failure, mild renal insufficiency. Patient's initial chest x-ray revealed enlargement of cardiac silhouette and pulmonary vascular congestion, bibasilar atelectasis. Patient was initiated on broad-spectrum antibiotic therapy with Rocephin and Zithromax intravenously nebulizing therapy, Lasix, BiPAP was initiated and patient was admitted to intensive care unit. With conservative therapy. Her condition improved and she was weaned off BiPAP, now she is on 2 L of oxygen through nasal cannula and feels comfortable. She diuresed approximately 1.8 L during the stay in the hospital time. She admitted of orthopnea prior to coming to the hospital, which resolved with diuresis . Patient admited of smoking tobacco approximately 10 years, however, quit more than 40 years ago  Review of Systems  Constitutional: Negative for fever, chills and weight loss.  HENT: Negative for congestion.   Eyes: Negative for blurred vision and double vision.  Respiratory: Positive for cough and sputum production. Negative for shortness of breath and wheezing.   Cardiovascular: Positive for leg swelling. Negative for chest pain, palpitations, orthopnea and PND.  Gastrointestinal: Negative for nausea, vomiting, abdominal pain, diarrhea,  constipation and blood in stool.  Genitourinary: Negative for dysuria, urgency, frequency and hematuria.  Musculoskeletal: Negative for falls.  Neurological: Negative for dizziness, tremors, focal weakness and headaches.  Endo/Heme/Allergies: Does not bruise/bleed easily.  Psychiatric/Behavioral: Negative for depression. The patient does not have insomnia.     VITAL SIGNS: Blood pressure 106/64, pulse 81, temperature 98.4 F (36.9 C), temperature source Oral, resp. rate 29, height 5\' 5"  (1.651 m), weight 123.3 kg (271 lb 13.2 oz), SpO2 81 %.  PHYSICAL EXAMINATION:   GENERAL:  70 y.o.-year-old patient lying in the bedin mild respiratory distress, intermittently tachypneic, especially whenever exerting herself, talking, however, able to speak long sentences, coughs intermittently. No significant sputum production.Marland Kitchen  EYES: Pupils equal, round, reactive to light and accommodation. No scleral icterus. Extraocular muscles intact.  HEENT: Head atraumatic, normocephalic. Oropharynx and nasopharynx clear.  NECK:  Supple, no jugular venous distention. No thyroid enlargement, no tenderness.  LUNGS:Some diminishedh sounds bilaterally, especially on bases, no wheezing, , however, unilateral, more on the right side, intermittent rales,rhonchi and crepitations were noted. No use of accessory muscles of respiration.  CARDIOVASCULAR: S1, S2 normal. No murmurs, rubs, or gallops.  ABDOMEN: Soft, nontender, nondistended. Bowel sounds present. No organomegaly or mass.  EXTREMITIES:Trace lower extremity and pedal edema, no cyanosis, or clubbing. Dorsalis pedis pulses are 2+  NEUROLOGIC: Cranial nerves II through XII are intact. Muscle strength 5/5 in all extremities. Sensation intact. Gait not checked.  PSYCHIATRIC: The patient is alert and oriented x 3.  SKIN: No obvious rash, lesion, or ulcer.   ORDERS/RESULTS REVIEWED:   CBC  Recent Labs Lab 07/18/15 0925 07/19/15 0546  WBC 7.9 6.1  HGB 14.7 15.0   HCT 46.4 47.8*  PLT 195 170  MCV 95.0 97.2  MCH 30.2 30.4  MCHC 31.8*  31.3*  RDW 19.1* 18.8*  LYMPHSABS 0.9*  --   MONOABS 0.6  --   EOSABS 0.2  --   BASOSABS 0.0  --    ------------------------------------------------------------------------------------------------------------------  Chemistries   Recent Labs Lab 07/18/15 0925 07/19/15 0546  NA 143 145  K 3.7 4.4  CL 97* 98*  CO2 39* 39*  GLUCOSE 134* 160*  BUN 27* 25*  CREATININE 1.07* 1.02*  CALCIUM 8.8* 8.9  MG 2.5*  --   AST 53*  --   ALT 74*  --   ALKPHOS 107  --   BILITOT 0.7  --    ------------------------------------------------------------------------------------------------------------------ estimated creatinine clearance is 69.6 mL/min (by C-G formula based on Cr of 1.02). ------------------------------------------------------------------------------------------------------------------ No results for input(s): TSH, T4TOTAL, T3FREE, THYROIDAB in the last 72 hours.  Invalid input(s): FREET3  Cardiac Enzymes  Recent Labs Lab 07/18/15 0925  TROPONINI 0.03   ------------------------------------------------------------------------------------------------------------------ Invalid input(s): POCBNP ---------------------------------------------------------------------------------------------------------------  RADIOLOGY: Dg Chest 2 View  07/18/2015  CLINICAL DATA:  Productive cough and shortness of breath for 1 week, history of CHF and hypertension, former smoker EXAM: CHEST  2 VIEW COMPARISON:  04/152016 FINDINGS: Enlargement of cardiac silhouette with pulmonary vascular congestion. Tortuosity of thoracic aorta. Chronic bronchitic changes with bibasilar atelectasis. Mild chronic accentuation of interstitial markings without acute infiltrate, pleural effusion, or pneumothorax. Bones demineralized. LEFT shoulder prosthesis. IMPRESSION: Enlargement of cardiac silhouette with pulmonary vascular congestion. Chronic  bronchitic changes with bibasilar atelectasis. No definite acute infiltrate. Electronically Signed   By: Lavonia Dana M.D.   On: 07/18/2015 10:21   Ct Angio Chest Pe W/cm &/or Wo Cm  07/18/2015  CLINICAL DATA:  Low oxygen saturations and shortness of breath. EXAM: CT ANGIOGRAPHY CHEST WITH CONTRAST TECHNIQUE: Multidetector CT imaging of the chest was performed using the standard protocol during bolus administration of intravenous contrast. Multiplanar CT image reconstructions and MIPs were obtained to evaluate the vascular anatomy. CONTRAST:  100 mL Isovue 370 COMPARISON:  Chest CTA 07/05/2014 and 01/01/2013 FINDINGS: Mediastinum/Lymph Nodes: No evidence for pulmonary embolism. Main pulmonary artery is enlarged measuring up to 4.2 cm. Small amount of fluid in superior pericardial recess. No suspicious chest lymphadenopathy. Lungs/Pleura: Trachea and mainstem bronchi are patent. 9 mm x 9 mm noncalcified nodule in the left lower lobe on sequence 6, image 90. This nodule has not significantly changed since 01/01/2013. Patchy parenchymal densities along the dependent aspect the lower lobes bilaterally. Small amount atelectasis in the posterior upper lobes bilaterally. There appears to be some scarring in left lower lobe. There appears to be some trace right pleural fluid. Upper abdomen: Question exophytic structure involving the anterior aspect of the left kidney on sequence 4, image 142. However, this is the last image and could be related to normal parenchyma. This area is incompletely evaluated. Otherwise, images of the upper abdomen are unremarkable. Musculoskeletal: Stable appearance of the anterior fixation plate and screws in the lower cervical spine. Again noted is severe compression deformity involving T7 vertebral body. Multilevel degenerative changes in the thoracic spine. Hemiarthroplasty of the left shoulder. Prominent subchondral cyst formations in the glenoids bilaterally. Review of the MIP images  confirms the above findings. IMPRESSION: Negative for pulmonary embolism. Patchy parenchymal densities in the dependent aspect of the lower lobes bilaterally. This likely represents a combination of scarring and atelectasis. Trace pleural fluid. Stable 9 mm nodule in the left lower lobe. There has been minimal change since 2014 suggesting that this is likely a benign etiology. Consider a 1 year follow-up to ensure  stability. Question an exophytic area involving the anterior aspect of the left kidney but this area is incompletely evaluated and could represent normal parenchyma. Consider follow-up renal ultrasound to exclude a renal lesion. Electronically Signed   By: Markus Daft M.D.   On: 07/18/2015 11:58   Dg Chest Port 1 View  07/19/2015  CLINICAL DATA:  Cough for 3 weeks. EXAM: PORTABLE CHEST 1 VIEW COMPARISON:  07/18/2015 FINDINGS: Shallow inspiration. Cardiac enlargement with mild vascular congestion. No edema. Probable small bilateral pleural effusions with atelectasis in the lung bases. Tortuous aorta. Postoperative changes in the cervical spine and left shoulder. No pneumothorax. IMPRESSION: Cardiac enlargement with mild pulmonary vascular congestion. Bilateral small pleural effusions and atelectasis in the lung bases. Electronically Signed   By: Lucienne Capers M.D.   On: 07/19/2015 06:35    EKG:  Orders placed or performed during the hospital encounter of 07/18/15  . EKG 12-Lead  . EKG 12-Lead  . ED EKG  . ED EKG    ASSESSMENT AND PLAN:  Active Problems:   Respiratory failure ([place]) 1. Acute on Chronic respiratory failure with hypoxia and hypercapnia due to acute diastolic CHF, COPD exacerbation, patient's chest x-ray revealed cardiac enlargement and pulmonary vascular congestion, bilateral pleural effusions and atelectasis, unlikely pneumonia, but acute bronchitis, continue oxygen therapy and wean off oxygen as tolerated #2. Acute on chronic diastolic CHF, echocardiogram revealed normal  ejection fraction, mild diastolic dysfunction and mild mitral valve regurgitation, continue Lasix, losartan #3. Essential hypertension, well-controlled on current medications #4. COPD exacerbation due to acute bronchitis, continue Rocephin and Zithromax. Get sputum cultures if possible, blood culture 1 was negative so far, MRSA PCR is negative #5. Acute renal insufficiency, seems to be stable with diuresis, follow with ARB and Lasix  #6. Hyperglycemia, get hemoglobin A1c to rule out diabetes mellitus  Management plans discussed with the patient, family and they are in agreement.   DRUG ALLERGIES:  Allergies  Allergen Reactions  . Lyrica [Pregabalin] Other (See Comments)    Reaction: made Restless Leg Syndrome worse     CODE STATUS:     Code Status Orders        Start     Ordered   07/18/15 1321  Full code   Continuous     07/18/15 1324    Code Status History    Date Active Date Inactive Code Status Order ID Comments User Context   07/02/2013 11:24 AM 07/03/2013  1:35 PM Full Code [id number]  Jenetta Loges, PA-C Inpatient    Advance Directive Documentation        Most Recent Value   Type of Advance Directive  Living will, Healthcare Power of Attorney   Pre-existing out of facility DNR order (yellow form or pink MOST form)     "MOST" Form in Place?        TOTAL TIME TAKING CARE OF THIS PATIENT: 40  minutes.    Theodoro Grist M.D on 07/19/2015 at 11:10 AM  Between 7am to 6pm - Pager - [phone]  After 6pm go to [email or web address] - password [email or web address] [facility]  [place] [facility]  Office  [phone]  CC: Primary care physician; No primary care provider on file.

## 2015-07-19 NOTE — Progress Notes (Signed)
Pt 85% on room air. Placed on 2L of nasal cannula.

## 2015-07-19 NOTE — Progress Notes (Signed)
Report given to Hannah Males, RN who is receiving [name] in room 138.  Jahid Weida A Cliff Damiani  4:24 PM

## 2015-07-19 NOTE — Progress Notes (Signed)
Patient transported to 138 by wheelchair at 1645.  O2 taken off upon transportation and patient's O2 sat at 98% during transportation.  Patient in bed with Wilhemena Durie, RN at the bedside. Reported to [facility] that O2 had been taken off for transport.  Levon Hedger RN

## 2015-07-19 NOTE — Care Management (Signed)
Patient admitted to icu stepdown due to the need for continuous bipap. she is currently on nasal cannula. She does not have chronic home 02.  She is current with her pcp- Dr Ramonita Lab.  Denies issues with accessing medical care, obtaining medications or transportation

## 2015-07-19 NOTE — Clinical Documentation Improvement (Signed)
Internal Medicine Critical Care  Can a  diagnosis be provided for the BMI of  44.7, if appropriate for this admission ?  Thank you    Identify Type - morbid obesity, obesity (link the BMI to condition e.g. morbid obesity with BMI of 47), overweight, with alveolar hypoventilation (Pickwickian Syndrome)  Document etiology of - drug induced (specify drug), due to excess calories, familial, endocrine  Other  Clinically Undetermined  Document any associated diagnoses/conditions.   Supporting Information:  COPD, Heart failure, OSA use of Oxygen/Bipap   Please exercise your independent, professional judgment when responding. A specific answer is not anticipated or expected.   Thank You,  [place] [phone]

## 2015-07-19 NOTE — Progress Notes (Signed)
Pt has maintained O2 goal of >88, and rested well on 2L [place]. Pts VS have remained stable even with getting up to the bedside commode. AM ABG order discontinued due to pt not requiring Bipap overnight.

## 2015-07-19 NOTE — Progress Notes (Signed)
Pharmacy Antibiotic Note  Hannah Powell is a 70 y.o. female admitted on 07/18/2015 with pneumonia.  Pharmacy has been consulted for ceftriaxone and azithromycin dosing.  Plan: After discussion with Dr. Mortimer Fries, will d/c ceftriaxone and continue azithromycin for a total of 5 days.  Continue azithromycin 500 mg iv daily.   Height: 5\' 5"  (165.1 cm) Weight: 271 lb 13.2 oz (123.3 kg) IBW/kg (Calculated) : 57  Temp (24hrs), Avg:98.4 F (36.9 C), Min:98.3 F (36.8 C), Max:98.6 F (37 C)   Recent Labs Lab 07/18/15 0925 07/19/15 0546  WBC 7.9 6.1  CREATININE 1.07* 1.02*    Estimated Creatinine Clearance: 69.6 mL/min (by C-G formula based on Cr of 1.02).    Allergies  Allergen Reactions  . Lyrica [Pregabalin] Other (See Comments)    Reaction: made Restless Leg Syndrome worse    Antimicrobials this admission: ceftriaxone 5/1 >> 5/2 azithromycin 5/1 >>  Levofloxacin one dose in ED on 5/1  Dose adjustments this admission:  Microbiology results: 5/1 BCx: NGTD 5/1 MRSA PCR: negative  Thank you for allowing pharmacy to be a part of this patient's care.  [name], PharmD Clinical Pharmacist 07/19/2015 2:11 PM

## 2015-07-19 NOTE — Evaluation (Signed)
Physical Therapy Evaluation Patient Details Name: Hannah Powell MRN: [id number] DOB: 1945/06/20 Today's Date: 07/19/2015   History of Present Illness  Pt presented to ED on 5/1 from D. Shah's office for low oxygen saturation down to 64% on RA. She was found to hypercarbic hypoxic respiratory failure, mild renal insufficiency. Patient's initial chest x-ray revealed enlargement of cardiac silhouette and pulmonary vascular congestion, bibasilar atelectasis.   Clinical Impression  Pt demonstrated generalized weakness and difficulty walking with decreased endurance. During session SpO2 ranged between 93 to 96% on 2L O2. Cues given for pursed lip breathing during activity. Required min guard for transfers and ambulation in room with FWW. Demonstrated good hand placement and steady gait with slow speed. No LOB during session. After hospital discharge, continuing OPPT downstairs at [facility] main therapy recommended as she was involved prior to hospital admission. This recommendation is in order to address deficits of strength, endurance, balance and gait to progress towards PLOF. Pt will benefit from skilled PT services to increase functional I and mobility for safe discharge.     Follow Up Recommendations Outpatient PT;Supervision - Intermittent (continue OPPT downstairs at [facility] main OP) Note: pt will likely need a new PT order to continue OPPT after hospital discharge.    Equipment Recommendations  None recommended by PT (pt has recommended FWW)    Recommendations for Other Services       Precautions / Restrictions Precautions Precautions: Fall Restrictions Weight Bearing Restrictions: Yes Other Position/Activity Restrictions: WBAT      Mobility  Bed Mobility               General bed mobility comments: up in recliner, NT  Transfers Overall transfer level: Needs assistance Equipment used: Rolling walker (2 wheeled) Transfers: Sit to/from [facility] Sit to  Stand: Min guard Stand pivot transfers: Min guard       General transfer comment: demonstrated good hand placement and stability, no LOB  Ambulation/Gait Ambulation/Gait assistance: Min guard Ambulation Distance (Feet): 15 Feet Assistive device: Rolling walker (2 wheeled) Gait Pattern/deviations: Decreased stride length;Trunk flexed Gait velocity: reduced   General Gait Details: Demonstrated steady but slow gait with no LOB.   Stairs            Wheelchair Mobility    Modified Rankin (Stroke Patients Only)       Balance Overall balance assessment: Needs assistance;History of Falls Sitting-balance support: Feet supported Sitting balance-Leahy Scale: Good     Standing balance support: Single extremity supported Standing balance-Leahy Scale: Fair Standing balance comment: able to reach out of BOS without LOB                             Pertinent Vitals/Pain Pain Assessment: No/denies pain    Home Living Family/patient expects to be discharged to:: Private residence Living Arrangements: Spouse/significant other Available Help at Discharge: Family;Available 24 hours/day Type of Home: House Home Access: Stairs to enter Entrance Stairs-Rails: Can reach both Entrance Stairs-Number of Steps: 2 to 6 Home Layout: One level Home Equipment: Walker - 2 wheels;Cane - single point;Shower seat - built in;Bedside commode Additional Comments: depending on entrance has 2 rails or 1 rail    Prior Function Level of Independence: Independent with assistive device(s)         Comments: She has been using crutches but not using any for ADLs until she walked to living room; she was seeing PT for outpatient downstairs (rehab receptionist and PT  Beth notified pt was in [place] per patient permission)     Hand Dominance   Dominant Hand: Right    Extremity/Trunk Assessment   Upper Extremity Assessment: Defer to OT evaluation           Lower Extremity  Assessment: Generalized weakness (grossly 4/5 strength B LEs)         Communication   Communication: No difficulties  Cognition Arousal/Alertness: Awake/alert Behavior During Therapy: WFL for tasks assessed/performed Overall Cognitive Status: Within Functional Limits for tasks assessed                      General Comments General comments (skin integrity, edema, etc.): B LE edema    Exercises Other Exercises Other Exercises: B LE seated therex: ankle pumps, QS, GS, LAQs, marching, hip abd and heel slides with manual resistance, hip add pillow squeezes x15 each. Cues for pursed lip breathing to maintain oxygen concentration.      Assessment/Plan    PT Assessment Patient needs continued PT services  PT Diagnosis Difficulty walking;Generalized weakness   PT Problem List Decreased strength;Decreased activity tolerance;Decreased balance;Decreased mobility;Decreased knowledge of use of DME;Cardiopulmonary status limiting activity;Obesity  PT Treatment Interventions DME instruction;Gait training;Stair training;Therapeutic activities;Therapeutic exercise;Balance training;Neuromuscular re-education;Patient/family education   PT Goals (Current goals can be found in the Care Plan section) Acute Rehab PT Goals Patient Stated Goal: "to keep getting better so I can go home" PT Goal Formulation: With patient Time For Goal Achievement: 08/02/15 Potential to Achieve Goals: Good    Frequency Min 2X/week   Barriers to discharge Inaccessible home environment stairs to enter    Co-evaluation               End of Session Equipment Utilized During Treatment: Gait belt;Oxygen Activity Tolerance: Patient tolerated treatment well Patient left: in chair;with call bell/phone within reach Nurse Communication: Mobility status         Time: 1505-1530 PT Time Calculation (min) (ACUTE ONLY): 25 min   Charges:   PT Evaluation $PT Eval Moderate Complexity: 1 Procedure PT  Treatments $Therapeutic Exercise: 8-22 mins   PT G Codes:        Neoma Laming, PT, DPT  07/19/2015, 3:54 PM [phone]

## 2015-07-20 DIAGNOSIS — G4733 Obstructive sleep apnea (adult) (pediatric): Secondary | ICD-10-CM

## 2015-07-20 DIAGNOSIS — I509 Heart failure, unspecified: Secondary | ICD-10-CM

## 2015-07-20 DIAGNOSIS — E669 Obesity, unspecified: Secondary | ICD-10-CM

## 2015-07-20 LAB — HEMOGLOBIN A1C: HEMOGLOBIN A1C: 5.7 % (ref 4.0–6.0)

## 2015-07-20 MED ORDER — FUROSEMIDE 40 MG PO TABS
40.0000 mg | ORAL_TABLET | Freq: Every day | ORAL | Status: DC
  Administered 2015-07-20 – 2015-07-22 (×3): 40 mg via ORAL
  Filled 2015-07-20 (×3): qty 1

## 2015-07-20 MED ORDER — IPRATROPIUM-ALBUTEROL 0.5-2.5 (3) MG/3ML IN SOLN
3.0000 mL | RESPIRATORY_TRACT | Status: DC
  Administered 2015-07-20 – 2015-07-22 (×11): 3 mL via RESPIRATORY_TRACT
  Filled 2015-07-20 (×11): qty 3

## 2015-07-20 NOTE — Progress Notes (Signed)
Physical Therapy Treatment Patient Details Name: Hannah Powell MRN: [id number] DOB: January 18, 1946 Today's Date: 07/20/2015    History of Present Illness Pt presented to ED on 5/1 from D. Shah's office for low oxygen saturation down to 64% on RA. She was found to hypercarbic hypoxic respiratory failure, mild renal insufficiency. Patient's initial chest x-ray revealed enlargement of cardiac silhouette and pulmonary vascular congestion, bibasilar atelectasis.     PT Comments    Pt progressing with PT this date, able to complete transfers with Mod I and ambulate 400' with RW on 2LO2 with O2 sats at 99% on 2.5L and 93% on room air.  Continue to progress POC.  Follow Up Recommendations  Outpatient PT;Supervision - Intermittent     Equipment Recommendations  Rolling walker with 5" wheels    Recommendations for Other Services       Precautions / Restrictions Precautions Precautions: Fall    Mobility  Bed Mobility Overal bed mobility: Modified Independent             General bed mobility comments: Supine to sit with HOB elevate to 45 degrees using L bed rail to exit with Mod I  Transfers Overall transfer level: Modified independent Equipment used: Rolling walker (2 wheeled) Transfers: Sit to/from Stand Sit to Stand: Supervision Stand pivot transfers: Modified independent (Device/Increase time)       General transfer comment: Sit<>stand from bed and toilet using RW and grab bar in bathroom.  Ambulation/Gait Ambulation/Gait assistance: Supervision Ambulation Distance (Feet): 400 Feet Assistive device: Rolling walker (2 wheeled)   Gait velocity: reduced Gait velocity interpretation: Below normal speed for age/gender General Gait Details: Slow steady gait with forward lean onto RW   Stairs            Wheelchair Mobility    Modified Rankin (Stroke Patients Only)       Balance Overall balance assessment: Modified Independent                                   Cognition Arousal/Alertness: Awake/alert Behavior During Therapy: WFL for tasks assessed/performed Overall Cognitive Status: Within Functional Limits for tasks assessed                      Exercises Other Exercises Other Exercises: ambulation    General Comments General comments (skin integrity, edema, etc.): B LE edema      Pertinent Vitals/Pain Pain Assessment: No/denies pain    Home Living                      Prior Function            PT Goals (current goals can now be found in the care plan section) Acute Rehab PT Goals Patient Stated Goal: "to keep getting better so I can go home" PT Goal Formulation: With patient Time For Goal Achievement: 08/02/15 Potential to Achieve Goals: Good    Frequency  Min 2X/week    PT Plan Current plan remains appropriate    Co-evaluation             End of Session Equipment Utilized During Treatment: Gait belt;Oxygen Activity Tolerance: Patient tolerated treatment well Patient left: in chair;with call bell/phone within reach     Time: 1415-1444 PT Time Calculation (min) (ACUTE ONLY): 29 min  Charges:  $Therapeutic Exercise: 23-37 mins  G Codes:      Tyke Outman A Martinez Boxx 07/20/2015, 3:30 PM

## 2015-07-20 NOTE — Care Management (Addendum)
Cardiac rehab follow up appointment 5/16. PCP is Dr. Ramonita Lab. O2 is new. Need O2 assessment documented. [facility] after speaking with patient regarding potential need. She denies difficulty obtaining meds. She states she is independent with mobility. RNCM will continue to follow. Patient states she has a nebulizer at home that works.

## 2015-07-20 NOTE — Discharge Instructions (Signed)
Heart Failure Clinic appointment on Aug 02, 2015 at 9:00am with Darylene Price, [place]. Please call [phone] to reschedule.

## 2015-07-20 NOTE — Progress Notes (Addendum)
SATURATION QUALIFICATIONS:  Patient Saturations on Room Air at Rest = 85 %  Please briefly explain why patient needs home oxygen: drop in O2 while ambulating

## 2015-07-20 NOTE — Progress Notes (Signed)
[facility] NAME: Hannah Powell    MR#:  [id number]  DATE OF BIRTH:  01-29-1946  SUBJECTIVE:  CHIEF COMPLAINT:   Chief Complaint  Patient presents with  . Shortness of Breath   patient is 70 year old Caucasian female with past medical history significant for history of CHF, shortness of breath, hypertension, obstructive sleep apnea, pulmonary tobacco abuse, who presents to the hospital with cough, greenish phlegm production, shortness of breath worsening over period of few weeks prior to coming to the hospital, associated with lower extremity swelling. On arrival to the hospital. Patient's labs revealed hypercarbic hypoxic respiratory failure, mild renal insufficiency. Patient's initial chest x-ray revealed enlargement of cardiac silhouette and pulmonary vascular congestion, bibasilar atelectasis. Patient was initiated on broad-spectrum antibiotic therapy with Rocephin and Zithromax intravenously nebulizing therapy, Lasix, BiPAP was initiated and patient was admitted to intensive care unit. With conservative therapy. Her condition improved and she was weaned off BiPAP, now she is on 2 L of oxygen through nasal cannula and feels Overall better. She diuresed approximately 2.2 L during the stay in the hospital time. She admitted of orthopnea prior to coming to the hospital, which resolved with diuresis . Patient admited of smoking tobacco approximately 10 years, however, quit more than 40 years ago . She feels better today, however, admits of shortness of breath, wheezing Review of Systems  Constitutional: Negative for fever, chills and weight loss.  HENT: Negative for congestion.   Eyes: Negative for blurred vision and double vision.  Respiratory: Positive for cough and sputum production. Negative for shortness of breath and wheezing.   Cardiovascular: Positive for leg swelling. Negative for chest pain, palpitations, orthopnea and PND.   Gastrointestinal: Negative for nausea, vomiting, abdominal pain, diarrhea, constipation and blood in stool.  Genitourinary: Negative for dysuria, urgency, frequency and hematuria.  Musculoskeletal: Negative for falls.  Neurological: Negative for dizziness, tremors, focal weakness and headaches.  Endo/Heme/Allergies: Does not bruise/bleed easily.  Psychiatric/Behavioral: Negative for depression. The patient does not have insomnia.     VITAL SIGNS: Blood pressure 142/69, pulse 86, temperature 98.4 F (36.9 C), temperature source Oral, resp. rate 18, height 5\' 5"  (1.651 m), weight 123.061 kg (271 lb 4.8 oz), SpO2 97 %.  PHYSICAL EXAMINATION:   GENERAL:  70 y.o.-year-old patient lying in the bedin mild respiratory distress, tachypneic, especially on exertion  talking, coughs intermittently. No significant sputum production.Marland Kitchen  EYES: Pupils equal, round, reactive to light and accommodation. No scleral icterus. Extraocular muscles intact.  HEENT: Head atraumatic, normocephalic. Oropharynx and nasopharynx clear.  NECK:  Supple, no jugular venous distention. No thyroid enlargement, no tenderness.  LUNGS:Diminishedh sounds bilaterally, especially on bases, diffuse bilateral inspiratory and expiratory wheezing, no rales,rhonchi or crepitations were noted. No use of accessory muscles of respiration.  CARDIOVASCULAR: S1, S2 normal. No murmurs, rubs, or gallops.  ABDOMEN: Soft, nontender, nondistended. Bowel sounds present. No organomegaly or mass.  EXTREMITIES:Trace lower extremity and pedal edema, no cyanosis, or clubbing. Dorsalis pedis pulses are 2+  NEUROLOGIC: Cranial nerves II through XII are intact. Muscle strength 5/5 in all extremities. Sensation intact. Gait not checked.  PSYCHIATRIC: The patient is alert and oriented x 3.  SKIN: No obvious rash, lesion, or ulcer.   ORDERS/RESULTS REVIEWED:   CBC  Recent Labs Lab 07/18/15 0925 07/19/15 0546  WBC 7.9 6.1  HGB 14.7 15.0  HCT 46.4  47.8*  PLT 195 170  MCV 95.0 97.2  MCH 30.2 30.4  MCHC 31.8*  31.3*  RDW 19.1* 18.8*  LYMPHSABS 0.9*  --   MONOABS 0.6  --   EOSABS 0.2  --   BASOSABS 0.0  --    ------------------------------------------------------------------------------------------------------------------  Chemistries   Recent Labs Lab 07/18/15 0925 07/19/15 0546  NA 143 145  K 3.7 4.4  CL 97* 98*  CO2 39* 39*  GLUCOSE 134* 160*  BUN 27* 25*  CREATININE 1.07* 1.02*  CALCIUM 8.8* 8.9  MG 2.5*  --   AST 53*  --   ALT 74*  --   ALKPHOS 107  --   BILITOT 0.7  --    ------------------------------------------------------------------------------------------------------------------ estimated creatinine clearance is 69.5 mL/min (by C-G formula based on Cr of 1.02). ------------------------------------------------------------------------------------------------------------------ No results for input(s): TSH, T4TOTAL, T3FREE, THYROIDAB in the last 72 hours.  Invalid input(s): FREET3  Cardiac Enzymes  Recent Labs Lab 07/18/15 0925  TROPONINI 0.03   ------------------------------------------------------------------------------------------------------------------ Invalid input(s): POCBNP ---------------------------------------------------------------------------------------------------------------  RADIOLOGY: Dg Chest Port 1 View  07/19/2015  CLINICAL DATA:  Cough for 3 weeks. EXAM: PORTABLE CHEST 1 VIEW COMPARISON:  07/18/2015 FINDINGS: Shallow inspiration. Cardiac enlargement with mild vascular congestion. No edema. Probable small bilateral pleural effusions with atelectasis in the lung bases. Tortuous aorta. Postoperative changes in the cervical spine and left shoulder. No pneumothorax. IMPRESSION: Cardiac enlargement with mild pulmonary vascular congestion. Bilateral small pleural effusions and atelectasis in the lung bases. Electronically Signed   By: Lucienne Capers M.D.   On: 07/19/2015 06:35     EKG:  Orders placed or performed during the hospital encounter of 07/18/15  . EKG 12-Lead  . EKG 12-Lead  . ED EKG  . ED EKG    ASSESSMENT AND PLAN:  Active Problems:   Respiratory failure ([place]) 1. Acute on Chronic respiratory failure with hypoxia and hypercapnia due to less likely 2/2 acute diastolic CHF, more COPD exacerbation, patient's chest x-ray revealed cardiac enlargement and pulmonary vascular congestion, bilateral pleural effusions and atelectasis, unlikely pneumonia, but acute bronchitis, continue oxygen therapy and wean off oxygen as tolerated #2. Acute on chronic diastolic CHF, echocardiogram revealed normal ejection fraction, mild diastolic dysfunction and mild mitral valve regurgitation, continue Lasix, losartan #3. Essential hypertension, well-controlled on current medications #4. COPD exacerbation due to acute bronchitis, continue Rocephin and Zithromax. No sputum cultures reported, blood culture 1 was negative so far, MRSA PCR is negative, clinically stable, improving #5. Acute renal insufficiency, stable with diuresis, follow with ARB and Lasix  #6. Hyperglycemia, hemoglobin A1c 5.7, no diabetes mellitus  Management plans discussed with the patient, family and they are in agreement.   DRUG ALLERGIES:  Allergies  Allergen Reactions  . Lyrica [Pregabalin] Other (See Comments)    Reaction: made Restless Leg Syndrome worse     CODE STATUS:     Code Status Orders        Start     Ordered   07/18/15 1321  Full code   Continuous     07/18/15 1324    Code Status History    Date Active Date Inactive Code Status Order ID Comments User Context   07/02/2013 11:24 AM 07/03/2013  1:35 PM Full Code [id number]  Jenetta Loges, PA-C Inpatient    Advance Directive Documentation        Most Recent Value   Type of Advance Directive  Living will, Healthcare Power of Attorney   Pre-existing out of facility DNR order (yellow form or pink MOST form)     "MOST" Form in  Place?  TOTAL TIME TAKING CARE OF THIS PATIENT: 30  minutes.    Theodoro Grist M.D on 07/20/2015 at 12:58 PM  Between 7am to 6pm - Pager - [phone]  After 6pm go to [email or web address] - password [email or web address] [facility]  [facility]  Office  [phone]  CC: Primary care physician; No primary care provider on file.

## 2015-07-20 NOTE — Consult Note (Signed)
[facility] Consultation     Date: 07/20/2015,   MRN# [id number] Kaida Maro [facility] 1946-01-03    AdmissionWeight: 268 lb (121.564 kg)                 CurrentWeight: 271 lb 4.8 oz (123.061 kg)      CHIEF COMPLAINT:   Acute resp failure-resolved   SUBJECTIVE  SOB resolved, still with cough (mild), echo pending, did not wear bipap lastnight, thought she didn't need it.  Currently on 2L of O2.  Tolerating PT well.     Review of Systems  Constitutional: Positive for malaise/fatigue. Negative for fever, chills and weight loss.  HENT: Positive for congestion.   Respiratory: Positive for cough. Negative for hemoptysis, sputum production, shortness of breath and wheezing.   Cardiovascular: Positive for leg swelling. Negative for chest pain, palpitations and orthopnea.  Gastrointestinal: Negative for nausea and vomiting.  Musculoskeletal: Positive for back pain.  Skin: Negative for rash.  Neurological: Negative for dizziness.  Psychiatric/Behavioral: The patient is not nervous/anxious.   All other systems reviewed and are negative.    VS: BP 142/69 mmHg  Pulse 86  Temp(Src) 98.4 F (36.9 C) (Oral)  Resp 18  Ht 5\' 5"  (1.651 m)  Wt 271 lb 4.8 oz (123.061 kg)  BMI 45.15 kg/m2  SpO2 97%     PHYSICAL EXAM   Physical Exam  Constitutional: She is oriented to person, place, and time. She appears well-developed and well-nourished. No distress.  HENT:  Head: Normocephalic and atraumatic.  Eyes: Pupils are equal, round, and reactive to light.  Neck: Normal range of motion. Neck supple.  Cardiovascular: Normal rate, regular rhythm and normal heart sounds.   No murmur heard. Pulmonary/Chest: No stridor. No respiratory distress. She has no wheezes.  Musculoskeletal: Normal range of motion. She exhibits edema.  Neurological: She is alert and oriented to person, place, and time. No cranial nerve deficit.  Skin: Skin is warm. She is not diaphoretic.    Psychiatric: She has a normal mood and affect.        LABS    Recent Labs     07/18/15  0925  07/19/15  0546  HGB  14.7  15.0  HCT  46.4  47.8*  MCV  95.0  97.2  WBC  7.9  6.1  BUN  27*  25*  CREATININE  1.07*  1.02*  GLUCOSE  134*  160*  CALCIUM  8.8*  8.9  ,           IMAGING    No results found.     ASSESSMENT/PLAN    70 yo female seen today for acute and severe hypercapnic resp failure from acute COPD exacerbation from acute and probable pneumonia, doing well today    Pulmonary -Respiratory Failure-resolved -Oxygen and biPAP at night for OSA -aggressive BD therapy -wean off steroids, off ABX now-watch for fevers   CARDIOVASCULAR -ECHO pending -Lasix as tolerated -Patient with h/o CHF   Patient will be seen in the Pulmonary clinic by Dr. Mortimer Fries for OSA optimization.  Pulmonary Clinic will call patient with appointment.   Thank you for consulting [facility], we will signoff at this time.  Please feel free to contact us with any questions at [phone] (please enter 7-digits).    Vilinda Boehringer, MD [facility] Pager [phone] (please enter 7-digits) On Call Pager - [phone] (please enter 7-digits) Clinic - [phone]  [phone]     

## 2015-07-20 NOTE — Care Management Important Message (Signed)
Important Message  Patient Details  Name: Hannah Powell MRN: [id number] Date of Birth: Aug 17, 1945   Medicare Important Message Given:  Yes    [name] 07/20/2015, 10:05 AM

## 2015-07-20 NOTE — Progress Notes (Signed)
Initial Heart Failure Clinic appointment scheduled on Aug 02, 2015 at 9:00am. Thank you for the referral.

## 2015-07-21 ENCOUNTER — Ambulatory Visit: Payer: Commercial Managed Care - HMO | Admitting: Physical Therapy

## 2015-07-21 ENCOUNTER — Telehealth: Payer: Self-pay | Admitting: *Deleted

## 2015-07-21 DIAGNOSIS — I10 Essential (primary) hypertension: Secondary | ICD-10-CM

## 2015-07-21 DIAGNOSIS — R739 Hyperglycemia, unspecified: Secondary | ICD-10-CM

## 2015-07-21 DIAGNOSIS — N289 Disorder of kidney and ureter, unspecified: Secondary | ICD-10-CM

## 2015-07-21 DIAGNOSIS — I5033 Acute on chronic diastolic (congestive) heart failure: Secondary | ICD-10-CM

## 2015-07-21 DIAGNOSIS — J441 Chronic obstructive pulmonary disease with (acute) exacerbation: Secondary | ICD-10-CM

## 2015-07-21 LAB — BASIC METABOLIC PANEL
ANION GAP: 8 (ref 5–15)
BUN: 24 mg/dL — AB (ref 6–20)
CALCIUM: 8.8 mg/dL — AB (ref 8.9–10.3)
CO2: 36 mmol/L — ABNORMAL HIGH (ref 22–32)
Chloride: 95 mmol/L — ABNORMAL LOW (ref 101–111)
Creatinine, Ser: 0.75 mg/dL (ref 0.44–1.00)
GFR calc Af Amer: >60 mL/min (ref >60)
GLUCOSE: 133 mg/dL — AB (ref 65–99)
Potassium: 3.7 mmol/L (ref 3.5–5.1)
SODIUM: 139 mmol/L (ref 135–145)

## 2015-07-21 MED ORDER — PREDNISONE 10 MG (21) PO TBPK: 10.0000 mg | ORAL_TABLET | Freq: Every day | ORAL | Status: DC

## 2015-07-21 MED ORDER — AZITHROMYCIN 250 MG PO TABS
500.0000 mg | ORAL_TABLET | Freq: Every day | ORAL | Status: DC
  Administered 2015-07-21 – 2015-07-22 (×2): 500 mg via ORAL
  Filled 2015-07-21 (×2): qty 2

## 2015-07-21 MED ORDER — FAMOTIDINE 20 MG PO TABS: 20.0000 mg | ORAL_TABLET | Freq: Every day | ORAL | Status: DC

## 2015-07-21 MED ORDER — HYDROCOD POLST-CPM POLST ER 10-8 MG/5ML PO SUER: 5.0000 mL | Freq: Two times a day (BID) | ORAL | Status: DC

## 2015-07-21 MED ORDER — IPRATROPIUM-ALBUTEROL 0.5-2.5 (3) MG/3ML IN SOLN: 3.0000 mL | RESPIRATORY_TRACT | Status: DC

## 2015-07-21 MED ORDER — GUAIFENESIN ER 600 MG PO TB12: 600.0000 mg | ORAL_TABLET | Freq: Two times a day (BID) | ORAL | Status: DC

## 2015-07-21 MED ORDER — FUROSEMIDE 40 MG PO TABS: 40.0000 mg | ORAL_TABLET | Freq: Every day | ORAL | Status: DC

## 2015-07-21 MED ORDER — AZITHROMYCIN 250 MG PO TABS: 250.0000 mg | ORAL_TABLET | Freq: Every day | ORAL | Status: DC

## 2015-07-21 MED ORDER — GUAIFENESIN-CODEINE 100-10 MG/5ML PO SOLN: 10.0000 mL | ORAL | Status: DC | PRN

## 2015-07-21 NOTE — Progress Notes (Addendum)
SATURATION QUALIFICATIONS: (This note is used to comply with regulatory documentation for home oxygen)  Patient Saturations on Room Air at Rest = 93%  Patient Saturations on Room Air while Ambulating = 80%  Patient Saturations on 1 Liters of oxygen while Ambulating = 95%  Please briefly explain why patient needs home oxygen:

## 2015-07-21 NOTE — Care Management (Addendum)
Patient O2 sats dropped to 80's on room air with mobility. Referral to [facility] with [place] for tank delivered to this room today. Dr. Jen Mow is concerned since her O2 sats dropped that low. Discharge cancelled per Dr. Jen Mow. Corene Cornea with [facility] notified. Patient is aware. And not happy. Flutter valve requested. Patient is not wearing O2 and does not think she needs it. She is also refusing home health services.

## 2015-07-21 NOTE — Progress Notes (Signed)
[facility] NAME: Hannah Powell    MR#:  [id number]  DATE OF BIRTH:  10/01/1945  SUBJECTIVE:  CHIEF COMPLAINT:   Chief Complaint  Patient presents with  . Shortness of Breath   patient is 70 year old Caucasian female with past medical history significant for history of CHF, shortness of breath, hypertension, obstructive sleep apnea, pulmonary tobacco abuse, who presents to the hospital with cough, greenish phlegm production, shortness of breath worsening over period of few weeks prior to coming to the hospital, associated with lower extremity swelling. On arrival to the hospital. Patient's labs revealed hypercarbic hypoxic respiratory failure, mild renal insufficiency. Patient's initial chest x-ray revealed enlargement of cardiac silhouette and pulmonary vascular congestion, bibasilar atelectasis. Patient was initiated on broad-spectrum antibiotic therapy with Rocephin and Zithromax intravenously nebulizing therapy, Lasix, BiPAP was initiated and patient was admitted to intensive care unit. With conservative therapy. Her condition improved and she was weaned off BiPAP, now she is on 2 L of oxygen through nasal cannula and feels Overall better. She diuresed approximately 3.3  L during the stay in the hospital time. She admitted of orthopnea prior to coming to the hospital, which resolved with diuresis . Patient admited of smoking tobacco approximately 10 years, however, quit more than 40 years ago . She feels better today, however, Oxygen saturations dropped down to 80% on room air on exertion.  Review of Systems  Constitutional: Negative for fever, chills and weight loss.  HENT: Negative for congestion.   Eyes: Negative for blurred vision and double vision.  Respiratory: Positive for cough and sputum production. Negative for shortness of breath and wheezing.   Cardiovascular: Positive for leg swelling. Negative for chest pain, palpitations,  orthopnea and PND.  Gastrointestinal: Negative for nausea, vomiting, abdominal pain, diarrhea, constipation and blood in stool.  Genitourinary: Negative for dysuria, urgency, frequency and hematuria.  Musculoskeletal: Negative for falls.  Neurological: Negative for dizziness, tremors, focal weakness and headaches.  Endo/Heme/Allergies: Does not bruise/bleed easily.  Psychiatric/Behavioral: Negative for depression. The patient does not have insomnia.     VITAL SIGNS: Blood pressure 136/71, pulse 86, temperature 97.7 F (36.5 C), temperature source Oral, resp. rate 20, height 5\' 5"  (1.651 m), weight 121.745 kg (268 lb 6.4 oz), SpO2 95 %.  PHYSICAL EXAMINATION:   GENERAL:  70 y.o.-year-old patient lying in the bed in no significant distress, less cough,, respirations  EYES: Pupils equal, round, reactive to light and accommodation. No scleral icterus. Extraocular muscles intact.  HEENT: Head atraumatic, normocephalic. Oropharynx and nasopharynx clear.  NECK:  Supple, no jugular venous distention. No thyroid enlargement, no tenderness.  LUNGS: Better air entrance, breath sounds bilaterally, especially on bases, no  wheezing, no rales,rhonchi or crepitations were noted. No use of accessory muscles of respiration.  CARDIOVASCULAR: S1, S2 normal. No murmurs, rubs, or gallops.  ABDOMEN: Soft, nontender, nondistended. Bowel sounds present. No organomegaly or mass.  EXTREMITIES:Trace lower extremity and pedal edema, no cyanosis, or clubbing. Dorsalis pedis pulses are 2+  NEUROLOGIC: Cranial nerves II through XII are intact. Muscle strength 5/5 in all extremities. Sensation intact. Gait not checked.  PSYCHIATRIC: The patient is alert and oriented x 3.  SKIN: No obvious rash, lesion, or ulcer.   ORDERS/RESULTS REVIEWED:   CBC  Recent Labs Lab 07/18/15 0925 07/19/15 0546  WBC 7.9 6.1  HGB 14.7 15.0  HCT 46.4 47.8*  PLT 195 170  MCV 95.0 97.2  MCH 30.2 30.4  MCHC 31.8* 31.3*  RDW 19.1*  18.8*  LYMPHSABS 0.9*  --   MONOABS 0.6  --   EOSABS 0.2  --   BASOSABS 0.0  --    ------------------------------------------------------------------------------------------------------------------  Chemistries   Recent Labs Lab 07/18/15 0925 07/19/15 0546 07/21/15 0347  NA 143 145 139  K 3.7 4.4 3.7  CL 97* 98* 95*  CO2 39* 39* 36*  GLUCOSE 134* 160* 133*  BUN 27* 25* 24*  CREATININE 1.07* 1.02* 0.75  CALCIUM 8.8* 8.9 8.8*  MG 2.5*  --   --   AST 53*  --   --   ALT 74*  --   --   ALKPHOS 107  --   --   BILITOT 0.7  --   --    ------------------------------------------------------------------------------------------------------------------ estimated creatinine clearance is 88.1 mL/min (by C-G formula based on Cr of 0.75). ------------------------------------------------------------------------------------------------------------------ No results for input(s): TSH, T4TOTAL, T3FREE, THYROIDAB in the last 72 hours.  Invalid input(s): FREET3  Cardiac Enzymes  Recent Labs Lab 07/18/15 0925  TROPONINI 0.03   ------------------------------------------------------------------------------------------------------------------ Invalid input(s): POCBNP ---------------------------------------------------------------------------------------------------------------  RADIOLOGY: No results found.  EKG:  Orders placed or performed during the hospital encounter of 07/18/15  . EKG 12-Lead  . EKG 12-Lead  . ED EKG  . ED EKG    ASSESSMENT AND PLAN:  Active Problems:   Acute on chronic respiratory failure with hypoxia and hypercapnia (HCC)   Acute on chronic diastolic CHF (congestive heart failure) (HCC)   COPD exacerbation (HCC)   Acute renal insufficiency   Hyperglycemia   Benign essential HTN 1. Acute on Chronic respiratory failure with hypoxia and hypercapnia due to acute diastolic CHF, COPD exacerbation, patient's chest x-ray revealed cardiac enlargement and pulmonary  vascular congestion, bilateral pleural effusions and atelectasis, unlikely pneumonia, but acute bronchitis, continue oxygen therapy and wean off oxygen as tolerated. Patient was weaned off oxygen and her O2 saturation remained stable on room air. However, it dropped down to 80s when patient was ambulated. Continue current therapy and recheck O2 sats again tomorrow morning #2. Acute on chronic diastolic CHF, echocardiogram revealed normal ejection fraction, mild diastolic dysfunction and mild mitral valve regurgitation, continue Lasix, losartan #3. Essential hypertension, controlled on current medications #4. COPD exacerbation due to acute bronchitis, continue Zithromax orally. No sputum cultures reported, blood culture 1 was negative so far, MRSA PCR is negative, clinically stable, improving #5. Acute renal insufficiency, stable with diuresis, follow with ARB and Lasix  #6. Hyperglycemia, hemoglobin A1c 5.7, no diabetes mellitus  Management plans discussed with the patient, family and they are in agreement.   DRUG ALLERGIES:  Allergies  Allergen Reactions  . Lyrica [Pregabalin] Other (See Comments)    Reaction: made Restless Leg Syndrome worse     CODE STATUS:     Code Status Orders        Start     Ordered   07/18/15 1321  Full code   Continuous     07/18/15 1324    Code Status History    Date Active Date Inactive Code Status Order ID Comments User Context   07/02/2013 11:24 AM 07/03/2013  1:35 PM Full Code [id number]  Jenetta Loges, PA-C Inpatient    Advance Directive Documentation        Most Recent Value   Type of Advance Directive  Living will, Healthcare Power of Attorney   Pre-existing out of facility DNR order (yellow form or pink MOST form)     "MOST" Form in Place?  TOTAL TIME TAKING CARE OF THIS PATIENT: 35 minutes.  Discharge planning, oxygen saturation on room air evaluation at rest as well as on exertion  Zurisadai Helminiak M.D on 07/21/2015 at 1:25 PM  Between  7am to 6pm - Pager - [phone]  After 6pm go to [email or web address] - password [email or web address] [facility]  [facility]  Office  [phone]  CC: Primary care physician; No primary care provider on file.

## 2015-07-21 NOTE — Progress Notes (Signed)
PT Cancellation Note  Patient Details Name: Hannah Powell MRN: [id number] DOB: 1946/02/15   Cancelled Treatment:    Reason Eval/Treat Not Completed: Other (comment)  Pt up in chair and ambulating with NA earlier today.  Pt just received lunch tray.  Will continue to follow as appropriate.  [name], GL 07/21/2015, 1:23 PM

## 2015-07-21 NOTE — Telephone Encounter (Signed)
-----   Message from Vilinda Boehringer, MD sent at 07/20/2015  2:25 PM EDT ----- Regarding: HFU Please setup patient with Dr. Mortimer Fries as a hospital follow up in 2-3 weeks.   Thanks VM

## 2015-07-21 NOTE — Care Management (Signed)
Patient appears to be on room air and tolerating at rest. Will need new O2 assessments to include ambulatory sats prior to discharge. I have updated [facility].

## 2015-07-21 NOTE — Telephone Encounter (Signed)
Spoke with Hannah Powell on the unit and gave hosp f/u appt for 5/15 to arrive at 11am with [facility]. Nothing further needed.

## 2015-07-21 NOTE — Progress Notes (Signed)
PHARMACIST - PHYSICIAN COMMUNICATION CONCERNING: Antibiotic IV to Oral Route Change Policy  RECOMMENDATION: This patient is receiving azithromycin by the intravenous route.  Based on criteria approved by the Pharmacy and Therapeutics Committee, the antibiotic(s) is/are being converted to the equivalent oral dose form(s).   DESCRIPTION: These criteria include:  Patient being treated for a respiratory tract infection, urinary tract infection, cellulitis or clostridium difficile associated diarrhea if on metronidazole  The patient is not neutropenic and does not exhibit a GI malabsorption state  The patient is eating (either orally or via tube) and/or has been taking other orally administered medications for a least 24 hours  The patient is improving clinically and has a Tmax < 100.5  If you have questions about this conversion, please contact the Pharmacy Department  []  ( [phone] )  [facility] [x]  ( [phone] )  [facility] []  ( [phone] )  [place] []  ( [phone] )  [facility] []  ( [phone] )  [facility]  

## 2015-07-22 NOTE — Progress Notes (Signed)
Ambulated patient half way around the nurses station. Patient O2 sats fluctuated around 88-91 on room air. Will continue to monitor.

## 2015-07-22 NOTE — Progress Notes (Signed)
SATURATION QUALIFICATIONS: (This note is used to comply with regulatory documentation for home oxygen)  Patient Saturations on Room Air at Rest = 95%  Patient Saturations on Room Air while Ambulating = 93%  Patient Saturations on 0 Liters of oxygen while Ambulating =93%  Please briefly explain why patient needs home oxygen: shortness of breath

## 2015-07-22 NOTE — Care Management (Signed)
Per staff patient did drop to 88% but within seconds of rest she recovered to 90's. She remained in the 90's with ambulation per staff. I have updated Corene Cornea with [facility]. Patient again refused home health services- said "she was not going to be homebound and not going to wait on them". 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 I'm not going to leave AMA today but I am going home today". No further RNCM needs. Case closed.

## 2015-07-22 NOTE — Discharge Summary (Signed)
Hannah Powell at [place] NAME: Hannah Powell    MR#:  [id number]  DATE OF BIRTH:  [birth year]  DATE OF ADMISSION:  07/18/2015 ADMITTING PHYSICIAN: No admitting provider for patient encounter.  DATE OF DISCHARGE: 07/22/2015 12:51 PM  PRIMARY CARE PHYSICIAN: No primary care provider on file.     ADMISSION DIAGNOSIS:  Cough [R05] Hypercapnia [R06.89] Hypoxia [R09.02]  DISCHARGE DIAGNOSIS:  Active Problems:   Acute on chronic respiratory failure with hypoxia and hypercapnia (HCC)   Acute on chronic diastolic CHF (congestive heart failure) (HCC)   COPD exacerbation (HCC)   Acute renal insufficiency   Hyperglycemia   Benign essential HTN   SECONDARY DIAGNOSIS:   Past Medical History  Diagnosis Date  . CHF (congestive heart failure) ([place])   . Shortness of breath   . Hypertension     dr Otho Najjar     . Hypothyroidism   . Anxiety   . Arthritis   . RLS (restless legs syndrome)   . Sleep apnea     cpap      >5 yrs    .pro HOSPITAL COURSE:  The patient is 70 year old Caucasian female with past medical history significant for history of CHF, shortness of breath, hypertension, obstructive sleep apnea, tobacco abuse, who presented to the hospital with cough, greenish phlegm production, shortness of breath worsening over period of few weeks prior to coming to the hospital, associated with lower extremity swelling. On arrival to the hospital the patient's labs revealed hypercarbic hypoxic respiratory failure, mild renal insufficiency. Patient's initial chest x-ray revealed enlargement of cardiac silhouette and pulmonary vascular congestion, bibasilar atelectasis. Patient was initiated on broad-spectrum antibiotic therapy with Rocephin and Zithromax intravenously,  nebulizing therapy, Lasix, BiPAP, oxygen and admitted to intensive care unit. With conservative therapy her condition improved and she was weaned off BiPAP, and eventually off oxygen.  She diuresed approximately 2.7 L during the stay in the hospital time.  Discussion by problem: 1. Acute on Chronic respiratory failure with hypoxia and hypercapnia due to acute diastolic CHF, COPD exacerbation, patient's chest x-ray revealed cardiac enlargement and pulmonary vascular congestion, bilateral pleural effusions and atelectasis, unlikely pneumonia, but acute bronchitis, the patient was weaned off oxygen. Her O2 sats were checked on room air at rest and on admission and it remained stable. #2. Acute on chronic diastolic CHF, echocardiogram revealed normal ejection fraction, mild diastolic dysfunction and mild mitral valve regurgitation, continue Lasix, losartan #3. Essential hypertension, controlled on current medications #4. COPD exacerbation due to acute bronchitis, continue Zithromax orally to complete course. No sputum cultures were obtained, blood culture 1 was negative,  MRSA PCR is negative, clinically  improved #5. Acute renal insufficiency, resolved despite diuresis,  ARB   #6. Hyperglycemia, hemoglobin A1c 5.7, no diabetes mellitus   DISCHARGE CONDITIONS:   stable  CONSULTS OBTAINED:     DRUG ALLERGIES:   Allergies  Allergen Reactions  . Lyrica [Pregabalin] Other (See Comments)    Reaction: made Restless Leg Syndrome worse     DISCHARGE MEDICATIONS:   Discharge Medication List as of 07/22/2015 11:05 AM    START taking these medications   Details  azithromycin (ZITHROMAX) 250 MG tablet Take 1 tablet (250 mg total) by mouth daily., Starting 07/21/2015, Until Discontinued, Normal    chlorpheniramine-HYDROcodone (TUSSIONEX PENNKINETIC ER) 10-8 MG/5ML SUER Take 5 mLs by mouth 2 (two) times daily., Starting 07/21/2015, Until Discontinued, Normal    famotidine (PEPCID) 20 MG tablet Take 1 tablet (20 mg  total) by mouth daily., Starting 07/21/2015, Until Discontinued, Normal    guaiFENesin (MUCINEX) 600 MG 12 hr tablet Take 1 tablet (600 mg total) by mouth 2 (two) times  daily., Starting 07/21/2015, Until Discontinued, Normal    guaiFENesin-codeine 100-10 MG/5ML syrup Take 10 mLs by mouth every 4 (four) hours as needed for cough., Starting 07/21/2015, Until Discontinued, Print    ipratropium-albuterol (DUONEB) 0.5-2.5 (3) MG/3ML SOLN Take 3 mLs by nebulization every 4 (four) hours., Starting 07/21/2015, Until Discontinued, Normal    predniSONE (STERAPRED UNI-PAK 21 TAB) 10 MG (21) TBPK tablet Take 1 tablet (10 mg total) by mouth daily. Please take 6 pills once in the morning on the first day, then taper by one pill daily until finished, Starting 07/21/2015, Until Discontinued, Normal      CONTINUE these medications which have CHANGED   Details  furosemide (LASIX) 40 MG tablet Take 1 tablet (40 mg total) by mouth daily., Starting 07/21/2015, Until Discontinued, Normal      CONTINUE these medications which have NOT CHANGED   Details  aspirin EC 81 MG tablet Take 81 mg by mouth daily., Until Discontinued, Historical Med    B Complex-C (B-COMPLEX WITH VITAMIN C) tablet Take 1 tablet by mouth daily., Until Discontinued, Historical Med    Calcium Carbonate-Vitamin D (CALCIUM-VITAMIN D) 500-200 MG-UNIT per tablet Take 1 tablet by mouth daily., Until Discontinued, Historical Med    Fluticasone Furoate-Vilanterol (BREO ELLIPTA) 100-25 MCG/INH AEPB Inhale 1 puff into the lungs daily., Until Discontinued, Historical Med    levothyroxine (SYNTHROID, LEVOTHROID) 200 MCG tablet Take 200 mcg by mouth daily before breakfast., Until Discontinued, Historical Med    losartan (COZAAR) 25 MG tablet Take 25 mg by mouth daily., Until Discontinued, Historical Med    Omega-3 Fatty Acids (OMEGA 3 PO) Take 1 capsule by mouth daily., Until Discontinued, Historical Med    pramipexole (MIRAPEX) 1 MG tablet Take 3 mg by mouth at bedtime., Until Discontinued, Historical Med      STOP taking these medications     ibuprofen (ADVIL,MOTRIN) 800 MG tablet      potassium chloride SA  (K-DUR,KLOR-CON) 20 MEQ tablet          DISCHARGE INSTRUCTIONS:    Follow up with PCP , pulmonary as outpatient  If you experience worsening of your admission symptoms, develop shortness of breath, life threatening emergency, suicidal or homicidal thoughts you must seek medical attention immediately by calling 911 or calling your MD immediately  if symptoms less severe.  You Must read complete instructions/literature along with all the possible adverse reactions/side effects for all the Medicines you take and that have been prescribed to you. Take any new Medicines after you have completely understood and accept all the possible adverse reactions/side effects.   Please note  You were cared for by a hospitalist during your hospital stay. If you have any questions about your discharge medications or the care you received while you were in the hospital after you are discharged, you can call the unit and asked to speak with the hospitalist on call if the hospitalist that took care of you is not available. Once you are discharged, your primary care physician will handle any further medical issues. Please note that NO REFILLS for any discharge medications will be authorized once you are discharged, as it is imperative that you return to your primary care physician (or establish a relationship with a primary care physician if you do not have one) for your aftercare needs so  that they can reassess your need for medications and monitor your lab values.    Today   CHIEF COMPLAINT:   Chief Complaint  Patient presents with  . Shortness of Breath    HISTORY OF PRESENT ILLNESS:  Hannah Powell  is a 70 y.o. female with a known history of CHF, shortness of breath, hypertension, obstructive sleep apnea, tobacco abuse, who presented to the hospital with cough, greenish phlegm production, shortness of breath worsening over period of few weeks prior to coming to the hospital, associated with lower extremity  swelling. On arrival to the hospital the patient's labs revealed hypercarbic hypoxic respiratory failure, mild renal insufficiency. Patient's initial chest x-ray revealed enlargement of cardiac silhouette and pulmonary vascular congestion, bibasilar atelectasis. Patient was initiated on broad-spectrum antibiotic therapy with Rocephin and Zithromax intravenously,  nebulizing therapy, Lasix, BiPAP, oxygen and admitted to intensive care unit. With conservative therapy her condition improved and she was weaned off BiPAP, and eventually off oxygen. She diuresed approximately 2.7 L during the stay in the hospital time.  Discussion by problem: 1. Acute on Chronic respiratory failure with hypoxia and hypercapnia due to acute diastolic CHF, COPD exacerbation, patient's chest x-ray revealed cardiac enlargement and pulmonary vascular congestion, bilateral pleural effusions and atelectasis, unlikely pneumonia, but acute bronchitis, the patient was weaned off oxygen. Her O2 sats were checked on room air at rest and on admission and it remained stable. #2. Acute on chronic diastolic CHF, echocardiogram revealed normal ejection fraction, mild diastolic dysfunction and mild mitral valve regurgitation, continue Lasix, losartan #3. Essential hypertension, controlled on current medications #4. COPD exacerbation due to acute bronchitis, continue Zithromax orally to complete course. No sputum cultures were obtained, blood culture 1 was negative,  MRSA PCR is negative, clinically  improved #5. Acute renal insufficiency, resolved despite diuresis,  ARB   #6. Hyperglycemia, hemoglobin A1c 5.7, no diabetes mellitus     VITAL SIGNS:  Blood pressure 140/74, pulse 96, temperature 97.9 F (36.6 C), temperature source Oral, resp. rate 18, height 5\' 5"  (1.651 m), weight 126.454 kg (278 lb 12.5 oz), SpO2 96 %.  I/O:   Intake/Output Summary (Last 24 hours) at 07/22/15 2000 Last data filed at 07/22/15 0900  Gross per 24 hour   Intake    360 ml  Output      0 ml  Net    360 ml    PHYSICAL EXAMINATION:  GENERAL:  70 y.o.-year-old patient lying in the bed with no acute distress.  EYES: Pupils equal, round, reactive to light and accommodation. No scleral icterus. Extraocular muscles intact.  HEENT: Head atraumatic, normocephalic. Oropharynx and nasopharynx clear.  NECK:  Supple, no jugular venous distention. No thyroid enlargement, no tenderness.  LUNGS: Normal breath sounds bilaterally, no wheezing, rales,rhonchi or crepitation. No use of accessory muscles of respiration.  CARDIOVASCULAR: S1, S2 normal. No murmurs, rubs, or gallops.  ABDOMEN: Soft, non-tender, non-distended. Bowel sounds present. No organomegaly or mass.  EXTREMITIES: No pedal edema, cyanosis, or clubbing.  NEUROLOGIC: Cranial nerves II through XII are intact. Muscle strength 5/5 in all extremities. Sensation intact. Gait not checked.  PSYCHIATRIC: The patient is alert and oriented x 3.  SKIN: No obvious rash, lesion, or ulcer.   DATA REVIEW:   CBC  Recent Labs Lab 07/19/15 0546  WBC 6.1  HGB 15.0  HCT 47.8*  PLT 170    Chemistries   Recent Labs Lab 07/18/15 0925  07/21/15 0347  NA 143  < > 139  K 3.7  < >  3.7  CL 97*  < > 95*  CO2 39*  < > 36*  GLUCOSE 134*  < > 133*  BUN 27*  < > 24*  CREATININE 1.07*  < > 0.75  CALCIUM 8.8*  < > 8.8*  MG 2.5*  --   --   AST 53*  --   --   ALT 74*  --   --   ALKPHOS 107  --   --   BILITOT 0.7  --   --   < > = values in this interval not displayed.  Cardiac Enzymes  Recent Labs Lab 07/18/15 0925  TROPONINI 0.03    Microbiology Results  Results for orders placed or performed during the hospital encounter of 07/18/15  Culture, blood (routine x 2)     Status: None (Preliminary result)   Collection Time: 07/18/15  9:25 AM  Result Value Ref Range Status   Specimen Description BLOOD  Final   Special Requests NONE  Final   Culture NO GROWTH 4 DAYS  Final   Report Status PENDING   Incomplete  MRSA PCR Screening     Status: None   Collection Time: 07/18/15  4:31 PM  Result Value Ref Range Status   MRSA by PCR NEGATIVE NEGATIVE Final    Comment:        The GeneXpert MRSA Assay (FDA approved for NASAL specimens only), is one component of a comprehensive MRSA colonization surveillance program. It is not intended to diagnose MRSA infection nor to guide or monitor treatment for MRSA infections.     RADIOLOGY:  No results found.  EKG:   Orders placed or performed during the hospital encounter of 07/18/15  . EKG 12-Lead  . EKG 12-Lead  . ED EKG  . ED EKG      Management plans discussed with the patient, family and they are in agreement.  CODE STATUS:  Code Status History    Date Active Date Inactive Code Status Order ID Comments User Context   07/18/2015  1:24 PM 07/22/2015  3:51 PM Full Code [id number]  Flora Lipps, MD ED   07/02/2013 11:24 AM 07/03/2013  1:35 PM Full Code [id number]  Jenetta Loges, PA-C Inpatient    Advance Directive Documentation        Most Recent Value   Type of Advance Directive  Living will, Healthcare Power of Attorney   Pre-existing out of facility DNR order (yellow form or pink MOST form)     "MOST" Form in Place?        TOTAL TIME TAKING CARE OF THIS PATIENT: 40 minutes.    Theodoro Grist M.D on 07/22/2015 at 8:00 PM  Between 7am to 6pm - Pager - [phone]  After 6pm go to [email or web address] - password [email or web address] [facility]  [facility]  Office  [phone]  CC: Primary care physician; No primary care provider on file.

## 2015-07-22 NOTE — Progress Notes (Signed)
Pt discharged home. DC instructions  provided and explained. Medications reviewed. Rx given. All questions answered. Pt denies pain and shortness of breath.

## 2015-07-23 LAB — CULTURE, BLOOD (ROUTINE X 2): Culture: NO GROWTH

## 2015-07-27 ENCOUNTER — Ambulatory Visit: Payer: Commercial Managed Care - HMO | Admitting: Physical Therapy

## 2015-07-29 DIAGNOSIS — Q6652 Congenital pes planus, left foot: Secondary | ICD-10-CM | POA: Diagnosis not present

## 2015-07-29 DIAGNOSIS — M2141 Flat foot [pes planus] (acquired), right foot: Secondary | ICD-10-CM | POA: Diagnosis not present

## 2015-08-01 ENCOUNTER — Encounter: Payer: Self-pay | Admitting: Internal Medicine

## 2015-08-01 ENCOUNTER — Ambulatory Visit (INDEPENDENT_AMBULATORY_CARE_PROVIDER_SITE_OTHER): Payer: Commercial Managed Care - HMO | Admitting: Internal Medicine

## 2015-08-01 VITALS — BP 150/90 | HR 88 | Ht 65.0 in | Wt 263.0 lb

## 2015-08-01 DIAGNOSIS — J438 Other emphysema: Secondary | ICD-10-CM | POA: Diagnosis not present

## 2015-08-01 DIAGNOSIS — Z6841 Body Mass Index (BMI) 40.0 and over, adult: Secondary | ICD-10-CM | POA: Diagnosis not present

## 2015-08-01 DIAGNOSIS — G4730 Sleep apnea, unspecified: Secondary | ICD-10-CM

## 2015-08-01 DIAGNOSIS — I10 Essential (primary) hypertension: Secondary | ICD-10-CM | POA: Diagnosis not present

## 2015-08-01 DIAGNOSIS — G4733 Obstructive sleep apnea (adult) (pediatric): Secondary | ICD-10-CM | POA: Diagnosis not present

## 2015-08-01 DIAGNOSIS — I5032 Chronic diastolic (congestive) heart failure: Secondary | ICD-10-CM | POA: Diagnosis not present

## 2015-08-01 MED ORDER — FLUTICASONE FUROATE-VILANTEROL 100-25 MCG/INH IN AEPB: 1.0000 | INHALATION_SPRAY | Freq: Every day | RESPIRATORY_TRACT | Status: AC

## 2015-08-01 NOTE — Patient Instructions (Signed)
Chronic Obstructive Pulmonary Disease Chronic obstructive pulmonary disease (COPD) is a common lung condition in which airflow from the lungs is limited. COPD is a general term that can be used to describe many different lung problems that limit airflow, including both chronic bronchitis and emphysema. If you have COPD, your lung function will probably never return to normal, but there are measures you can take to improve lung function and make yourself feel better. CAUSES   Smoking (common).  Exposure to secondhand smoke.  Genetic problems.  Chronic inflammatory lung diseases or recurrent infections. SYMPTOMS  Shortness of breath, especially with physical activity.  Deep, persistent (chronic) cough with a large amount of thick mucus.  Wheezing.  Rapid breaths (tachypnea).  Gray or bluish discoloration (cyanosis) of the skin, especially in your fingers, toes, or lips.  Fatigue.  Weight loss.  Frequent infections or episodes when breathing symptoms become much worse (exacerbations).  Chest tightness. DIAGNOSIS Your health care provider will take a medical history and perform a physical examination to diagnose COPD. Additional tests for COPD may include:  Lung (pulmonary) function tests.  Chest X-ray.  CT scan.  Blood tests. TREATMENT  Treatment for COPD may include:  Inhaler and nebulizer medicines. These help manage the symptoms of COPD and make your breathing more comfortable.  Supplemental oxygen. Supplemental oxygen is only helpful if you have a low oxygen level in your blood.  Exercise and physical activity. These are beneficial for nearly all people with COPD.  Lung surgery or transplant.  Nutrition therapy to gain weight, if you are underweight.  Pulmonary rehabilitation. This may involve working with a team of health care providers and specialists, such as respiratory, occupational, and physical therapists. HOME CARE INSTRUCTIONS  Take all medicines  (inhaled or pills) as directed by your health care provider.  Avoid over-the-counter medicines or cough syrups that dry up your airway (such as antihistamines) and slow down the elimination of secretions unless instructed otherwise by your health care provider.  If you are a smoker, the most important thing that you can do is stop smoking. Continuing to smoke will cause further lung damage and breathing trouble. Ask your health care provider for help with quitting smoking. He or she can direct you to community resources or hospitals that provide support.  Avoid exposure to irritants such as smoke, chemicals, and fumes that aggravate your breathing.  Use oxygen therapy and pulmonary rehabilitation if directed by your health care provider. If you require home oxygen therapy, ask your health care provider whether you should purchase a pulse oximeter to measure your oxygen level at home.  Avoid contact with individuals who have a contagious illness.  Avoid extreme temperature and humidity changes.  Eat healthy foods. Eating smaller, more frequent meals and resting before meals may help you maintain your strength.  Stay active, but balance activity with periods of rest. Exercise and physical activity will help you maintain your ability to do things you want to do.  Preventing infection and hospitalization is very important when you have COPD. Make sure to receive all the vaccines your health care provider recommends, especially the pneumococcal and influenza vaccines. Ask your health care provider whether you need a pneumonia vaccine.  Learn and use relaxation techniques to manage stress.  Learn and use controlled breathing techniques as directed by your health care provider. Controlled breathing techniques include:  Pursed lip breathing. Start by breathing in (inhaling) through your nose for 1 second. Then, purse your lips as if you were   going to whistle and breathe out (exhale) through the  pursed lips for 2 seconds.  Diaphragmatic breathing. Start by putting one hand on your abdomen just above your waist. Inhale slowly through your nose. The hand on your abdomen should move out. Then purse your lips and exhale slowly. You should be able to feel the hand on your abdomen moving in as you exhale.  Learn and use controlled coughing to clear mucus from your lungs. Controlled coughing is a series of short, progressive coughs. The steps of controlled coughing are: 1. Lean your head slightly forward. 2. Breathe in deeply using diaphragmatic breathing. 3. Try to hold your breath for 3 seconds. 4. Keep your mouth slightly open while coughing twice. 5. Spit any mucus out into a tissue. 6. Rest and repeat the steps once or twice as needed. SEEK MEDICAL CARE IF:  You are coughing up more mucus than usual.  There is a change in the color or thickness of your mucus.  Your breathing is more labored than usual.  Your breathing is faster than usual. SEEK IMMEDIATE MEDICAL CARE IF:  You have shortness of breath while you are resting.  You have shortness of breath that prevents you from:  Being able to talk.  Performing your usual physical activities.  You have chest pain lasting longer than 5 minutes.  Your skin color is more cyanotic than usual.  You measure low oxygen saturations for longer than 5 minutes with a pulse oximeter. MAKE SURE YOU:  Understand these instructions.  Will watch your condition.  Will get help right away if you are not doing well or get worse.   This information is not intended to replace advice given to you by your health care provider. Make sure you discuss any questions you have with your health care provider.   Document Released: 12/13/2004 Document Revised: 03/26/2014 Document Reviewed: 10/30/2012 Elsevier Interactive Patient Education 2016 [employer]  

## 2015-08-01 NOTE — Progress Notes (Signed)
[facility] Consultation     Date: 08/01/2015,   MRN# [id number] Hannah Powell Sep 13, 1945 Code Status:  Code Status History    Date Active Date Inactive Code Status Order ID Comments User Context   07/18/2015  1:24 PM 07/22/2015  3:51 PM Full Code [id number]  Flora Lipps, MD ED   07/02/2013 11:24 AM 07/03/2013  1:35 PM Full Code [id number]  Jenetta Loges, PA-C Inpatient     Hosp day:@LENGTHOFSTAYDAYS @ Referring MD: @ATDPROV @     PCP:      AdmissionWeight: 263 lb (119.296 kg)                 CurrentWeight: 263 lb (119.296 kg)  CHIEF COMPLAINT:   Hospital follow up for resp failure   HISTORY OF PRESENT ILLNESS    70 yo obese white female admitted recently for acute COPD exacerbation Patient was treated with biPAP therapy, abx and steroids, patient doing well after hospitalization Patient is remote smoker, quit 1977 Currently, patient doing well. No SOB/DOE Patient is compliant with CPAP therapy for OSA No sign of infection at this time Takes BReo and dounebs every 6 hrs Patient uses CPAP for OSA(reports 12 cm h2o)   Home Medication:   Current Medication:   Current outpatient prescriptions:  .  aspirin EC 81 MG tablet, Take 81 mg by mouth daily., Disp: , Rfl:  .  B Complex-C (B-COMPLEX WITH VITAMIN C) tablet, Take 1 tablet by mouth daily., Disp: , Rfl:  .  Calcium Carbonate-Vitamin D (CALCIUM-VITAMIN D) 500-200 MG-UNIT per tablet, Take 1 tablet by mouth daily., Disp: , Rfl:  .  chlorpheniramine-HYDROcodone (TUSSIONEX PENNKINETIC ER) 10-8 MG/5ML SUER, Take 5 mLs by mouth 2 (two) times daily., Disp: 140 mL, Rfl: 0 .  famotidine (PEPCID) 20 MG tablet, Take 1 tablet (20 mg total) by mouth daily., Disp: 30 tablet, Rfl: 5 .  Fluticasone Furoate-Vilanterol (BREO ELLIPTA) 100-25 MCG/INH AEPB, Inhale 1 puff into the lungs daily., Disp: , Rfl:  .  furosemide (LASIX) 40 MG tablet, Take 1 tablet (40 mg total) by mouth daily., Disp: 30 tablet, Rfl: 5 .  guaiFENesin  (MUCINEX) 600 MG 12 hr tablet, Take 1 tablet (600 mg total) by mouth 2 (two) times daily., Disp: 20 tablet, Rfl: 2 .  guaiFENesin-codeine 100-10 MG/5ML syrup, Take 10 mLs by mouth every 4 (four) hours as needed for cough., Disp: 120 mL, Rfl: 0 .  ipratropium-albuterol (DUONEB) 0.5-2.5 (3) MG/3ML SOLN, Take 3 mLs by nebulization every 4 (four) hours., Disp: 360 mL, Rfl: 5 .  levothyroxine (SYNTHROID, LEVOTHROID) 200 MCG tablet, Take 200 mcg by mouth daily before breakfast., Disp: , Rfl:  .  losartan (COZAAR) 25 MG tablet, Take 25 mg by mouth daily., Disp: , Rfl:  .  Omega-3 Fatty Acids (OMEGA 3 PO), Take 1 capsule by mouth daily., Disp: , Rfl:  .  pramipexole (MIRAPEX) 1 MG tablet, Take 4 mg by mouth at bedtime. , Disp: , Rfl:      ALLERGIES   Lyrica     REVIEW OF SYSTEMS   Review of Systems  Constitutional: Negative for fever, chills, weight loss, malaise/fatigue and diaphoresis.  HENT: Negative for congestion and hearing loss.   Eyes: Negative for blurred vision and double vision.  Respiratory: Negative for cough, hemoptysis, sputum production, shortness of breath and wheezing.   Cardiovascular: Negative for chest pain, palpitations, orthopnea and leg swelling.  Gastrointestinal: Negative for heartburn, nausea, vomiting and abdominal pain.  Skin: Negative for rash.  Neurological:  Negative for dizziness, weakness and headaches.  Endo/Heme/Allergies: Does not bruise/bleed easily.  All other systems reviewed and are negative.    VS: BP 150/90 mmHg  Pulse 88  Ht 5\' 5"  (1.651 m)  Wt 263 lb (119.296 kg)  BMI 43.77 kg/m2  SpO2 92%     PHYSICAL EXAM   Physical Exam  Constitutional: She is oriented to person, place, and time. She appears well-developed and well-nourished. No distress.  HENT:  Head: Normocephalic and atraumatic.  Mouth/Throat: No oropharyngeal exudate.  Eyes: EOM are normal. Pupils are equal, round, and reactive to light. No scleral icterus.  Neck: Normal  range of motion. Neck supple.  Cardiovascular: Normal rate, regular rhythm and normal heart sounds.   No murmur heard. Pulmonary/Chest: No stridor. No respiratory distress. She has no wheezes. She has no rales.  Musculoskeletal: Normal range of motion. She exhibits no edema.  Neurological: She is alert and oriented to person, place, and time. No cranial nerve deficit.  Skin: Skin is warm. She is not diaphoretic.  Psychiatric: She has a normal mood and affect.        ASSESSMENT/PLAN   71 yo white female with recent hospitalization for high likelihood for COPD with obesity and underlying OSA  1.check ONO 2.continue CPAP therapy for OSA 3.will need to check PFT/6MWT 4.continue inhaled steroids/LABA(BREO) 5.dounebs every 6 hrs as needed  Follow up in 2 months     The Patient requires high complexity decision making for assessment and support, frequent evaluation and titration of therapies, application of advanced monitoring technologies and extensive interpretation of multiple databases.  Patient/Family are satisfied with Plan of action and management. All questions answered   Corrin Parker, M.D.  [facility]  Medical Director [place] Director [facility] Cardio-Pulmonary Department

## 2015-08-02 ENCOUNTER — Ambulatory Visit: Payer: Self-pay | Admitting: Family

## 2015-08-03 ENCOUNTER — Ambulatory Visit: Payer: Commercial Managed Care - HMO | Admitting: Physical Therapy

## 2015-08-04 DIAGNOSIS — R55 Syncope and collapse: Secondary | ICD-10-CM | POA: Diagnosis not present

## 2015-08-05 ENCOUNTER — Encounter: Payer: Self-pay | Admitting: Internal Medicine

## 2015-08-05 DIAGNOSIS — H2513 Age-related nuclear cataract, bilateral: Secondary | ICD-10-CM | POA: Diagnosis not present

## 2015-08-08 ENCOUNTER — Ambulatory Visit: Payer: Commercial Managed Care - HMO | Admitting: Physical Therapy

## 2015-08-09 DIAGNOSIS — G4730 Sleep apnea, unspecified: Secondary | ICD-10-CM | POA: Diagnosis not present

## 2015-08-16 ENCOUNTER — Encounter: Payer: Self-pay | Admitting: Physical Therapy

## 2015-08-16 ENCOUNTER — Telehealth: Payer: Self-pay | Admitting: Internal Medicine

## 2015-08-16 DIAGNOSIS — J449 Chronic obstructive pulmonary disease, unspecified: Secondary | ICD-10-CM

## 2015-08-16 NOTE — Telephone Encounter (Signed)
Pt informed of ONO results. Order placed for O2 for night time use @2L .

## 2015-08-16 NOTE — Telephone Encounter (Signed)
Pt is calling to check up on [facility]. If we received the report, it is a over night test she did. Please call back

## 2015-08-17 DIAGNOSIS — J449 Chronic obstructive pulmonary disease, unspecified: Secondary | ICD-10-CM | POA: Diagnosis not present

## 2015-08-30 ENCOUNTER — Ambulatory Visit: Payer: Self-pay | Admitting: Internal Medicine

## 2015-09-01 DIAGNOSIS — S82032D Displaced transverse fracture of left patella, subsequent encounter for closed fracture with routine healing: Secondary | ICD-10-CM | POA: Diagnosis not present

## 2015-09-01 DIAGNOSIS — R29898 Other symptoms and signs involving the musculoskeletal system: Secondary | ICD-10-CM | POA: Diagnosis not present

## 2015-09-01 DIAGNOSIS — G4733 Obstructive sleep apnea (adult) (pediatric): Secondary | ICD-10-CM | POA: Diagnosis not present

## 2015-09-01 DIAGNOSIS — Z96653 Presence of artificial knee joint, bilateral: Secondary | ICD-10-CM | POA: Diagnosis not present

## 2015-09-01 DIAGNOSIS — R296 Repeated falls: Secondary | ICD-10-CM | POA: Diagnosis not present

## 2015-09-09 DIAGNOSIS — J438 Other emphysema: Secondary | ICD-10-CM | POA: Diagnosis not present

## 2015-09-09 DIAGNOSIS — G629 Polyneuropathy, unspecified: Secondary | ICD-10-CM | POA: Diagnosis not present

## 2015-09-09 DIAGNOSIS — R05 Cough: Secondary | ICD-10-CM | POA: Diagnosis not present

## 2015-09-09 DIAGNOSIS — I10 Essential (primary) hypertension: Secondary | ICD-10-CM | POA: Diagnosis not present

## 2015-09-09 DIAGNOSIS — I5032 Chronic diastolic (congestive) heart failure: Secondary | ICD-10-CM | POA: Diagnosis not present

## 2015-09-09 DIAGNOSIS — G4733 Obstructive sleep apnea (adult) (pediatric): Secondary | ICD-10-CM | POA: Diagnosis not present

## 2015-09-13 ENCOUNTER — Ambulatory Visit (INDEPENDENT_AMBULATORY_CARE_PROVIDER_SITE_OTHER): Payer: Commercial Managed Care - HMO | Admitting: *Deleted

## 2015-09-13 DIAGNOSIS — G4730 Sleep apnea, unspecified: Secondary | ICD-10-CM

## 2015-09-13 DIAGNOSIS — R0902 Hypoxemia: Secondary | ICD-10-CM

## 2015-09-13 LAB — PULMONARY FUNCTION TEST
DL/VA % PRED: 97 %
DL/VA: 4.81 ml/min/mmHg/L
DLCO unc % pred: 63 %
DLCO unc: 16.23 ml/min/mmHg
FEF 25-75 POST: 0.86 L/s
FEF 25-75 Pre: 0.85 L/sec
FEF2575-%CHANGE-POST: 1 %
FEF2575-%PRED-POST: 42 %
FEF2575-%Pred-Pre: 42 %
FEV1-%Change-Post: 0 %
FEV1-%PRED-PRE: 52 %
FEV1-%Pred-Post: 52 %
FEV1-PRE: 1.27 L
FEV1-Post: 1.27 L
FEV1FVC-%CHANGE-POST: 0 %
FEV1FVC-%PRED-PRE: 94 %
FEV6-%Change-Post: 0 %
FEV6-%PRED-PRE: 58 %
FEV6-%Pred-Post: 58 %
FEV6-Post: 1.77 L
FEV6-Pre: 1.76 L
FEV6FVC-%Pred-Post: 104 %
FEV6FVC-%Pred-Pre: 104 %
FVC-%Change-Post: 0 %
FVC-%PRED-POST: 56 %
FVC-%PRED-PRE: 55 %
FVC-POST: 1.77 L
FVC-PRE: 1.77 L
POST FEV1/FVC RATIO: 72 %
PRE FEV1/FVC RATIO: 72 %
Post FEV6/FVC ratio: 100 %
Pre FEV6/FVC Ratio: 100 %
RV % pred: 142 %
RV: 3.15 L
TLC % pred: 98 %
TLC: 5.1 L

## 2015-09-13 NOTE — Progress Notes (Signed)
PFT performed today. 

## 2015-09-15 DIAGNOSIS — Z96652 Presence of left artificial knee joint: Secondary | ICD-10-CM | POA: Diagnosis not present

## 2015-09-15 DIAGNOSIS — M7050 Other bursitis of knee, unspecified knee: Secondary | ICD-10-CM | POA: Diagnosis not present

## 2015-09-15 DIAGNOSIS — M1712 Unilateral primary osteoarthritis, left knee: Secondary | ICD-10-CM | POA: Diagnosis not present

## 2015-09-15 DIAGNOSIS — S82032D Displaced transverse fracture of left patella, subsequent encounter for closed fracture with routine healing: Secondary | ICD-10-CM | POA: Diagnosis not present

## 2015-09-16 DIAGNOSIS — J449 Chronic obstructive pulmonary disease, unspecified: Secondary | ICD-10-CM | POA: Diagnosis not present

## 2015-09-22 ENCOUNTER — Ambulatory Visit (INDEPENDENT_AMBULATORY_CARE_PROVIDER_SITE_OTHER): Payer: Commercial Managed Care - HMO | Admitting: Internal Medicine

## 2015-09-22 ENCOUNTER — Encounter: Payer: Self-pay | Admitting: Internal Medicine

## 2015-09-22 VITALS — BP 142/80 | HR 95 | Ht 65.0 in | Wt 271.0 lb

## 2015-09-22 DIAGNOSIS — J449 Chronic obstructive pulmonary disease, unspecified: Secondary | ICD-10-CM | POA: Diagnosis not present

## 2015-09-22 NOTE — Patient Instructions (Signed)
1.continue Breo 2.dounebs as needed 3.needs 2 L Oxygen at night with CPAP 4.follow up in 3 months  Chronic Obstructive Pulmonary Disease Chronic obstructive pulmonary disease (COPD) is a common lung condition in which airflow from the lungs is limited. COPD is a general term that can be used to describe many different lung problems that limit airflow, including both chronic bronchitis and emphysema. If you have COPD, your lung function will probably never return to normal, but there are measures you can take to improve lung function and make yourself feel better. CAUSES   Smoking (common).  Exposure to secondhand smoke.  Genetic problems.  Chronic inflammatory lung diseases or recurrent infections. SYMPTOMS  Shortness of breath, especially with physical activity.  Deep, persistent (chronic) cough with a large amount of thick mucus.  Wheezing.  Rapid breaths (tachypnea).  Gray or bluish discoloration (cyanosis) of the skin, especially in your fingers, toes, or lips.  Fatigue.  Weight loss.  Frequent infections or episodes when breathing symptoms become much worse (exacerbations).  Chest tightness. DIAGNOSIS Your health care provider will take a medical history and perform a physical examination to diagnose COPD. Additional tests for COPD may include:  Lung (pulmonary) function tests.  Chest X-ray.  CT scan.  Blood tests. TREATMENT  Treatment for COPD may include:  Inhaler and nebulizer medicines. These help manage the symptoms of COPD and make your breathing more comfortable.  Supplemental oxygen. Supplemental oxygen is only helpful if you have a low oxygen level in your blood.  Exercise and physical activity. These are beneficial for nearly all people with COPD.  Lung surgery or transplant.  Nutrition therapy to gain weight, if you are underweight.  Pulmonary rehabilitation. This may involve working with a team of health care providers and specialists, such  as respiratory, occupational, and physical therapists. HOME CARE INSTRUCTIONS  Take all medicines (inhaled or pills) as directed by your health care provider.  Avoid over-the-counter medicines or cough syrups that dry up your airway (such as antihistamines) and slow down the elimination of secretions unless instructed otherwise by your health care provider.  If you are a smoker, the most important thing that you can do is stop smoking. Continuing to smoke will cause further lung damage and breathing trouble. Ask your health care provider for help with quitting smoking. He or she can direct you to community resources or hospitals that provide support.  Avoid exposure to irritants such as smoke, chemicals, and fumes that aggravate your breathing.  Use oxygen therapy and pulmonary rehabilitation if directed by your health care provider. If you require home oxygen therapy, ask your health care provider whether you should purchase a pulse oximeter to measure your oxygen level at home.  Avoid contact with individuals who have a contagious illness.  Avoid extreme temperature and humidity changes.  Eat healthy foods. Eating smaller, more frequent meals and resting before meals may help you maintain your strength.  Stay active, but balance activity with periods of rest. Exercise and physical activity will help you maintain your ability to do things you want to do.  Preventing infection and hospitalization is very important when you have COPD. Make sure to receive all the vaccines your health care provider recommends, especially the pneumococcal and influenza vaccines. Ask your health care provider whether you need a pneumonia vaccine.  Learn and use relaxation techniques to manage stress.  Learn and use controlled breathing techniques as directed by your health care provider. Controlled breathing techniques include:  Pursed lip breathing.  Start by breathing in (inhaling) through your nose for 1  second. Then, purse your lips as if you were going to whistle and breathe out (exhale) through the pursed lips for 2 seconds.  Diaphragmatic breathing. Start by putting one hand on your abdomen just above your waist. Inhale slowly through your nose. The hand on your abdomen should move out. Then purse your lips and exhale slowly. You should be able to feel the hand on your abdomen moving in as you exhale.  Learn and use controlled coughing to clear mucus from your lungs. Controlled coughing is a series of short, progressive coughs. The steps of controlled coughing are: 1. Lean your head slightly forward. 2. Breathe in deeply using diaphragmatic breathing. 3. Try to hold your breath for 3 seconds. 4. Keep your mouth slightly open while coughing twice. 5. Spit any mucus out into a tissue. 6. Rest and repeat the steps once or twice as needed. SEEK MEDICAL CARE IF:  You are coughing up more mucus than usual.  There is a change in the color or thickness of your mucus.  Your breathing is more labored than usual.  Your breathing is faster than usual. SEEK IMMEDIATE MEDICAL CARE IF:  You have shortness of breath while you are resting.  You have shortness of breath that prevents you from:  Being able to talk.  Performing your usual physical activities.  You have chest pain lasting longer than 5 minutes.  Your skin color is more cyanotic than usual.  You measure low oxygen saturations for longer than 5 minutes with a pulse oximeter. MAKE SURE YOU:  Understand these instructions.  Will watch your condition.  Will get help right away if you are not doing well or get worse.   This information is not intended to replace advice given to you by your health care provider. Make sure you discuss any questions you have with your health care provider.   Document Released: 12/13/2004 Document Revised: 03/26/2014 Document Reviewed: 10/30/2012 Elsevier Interactive Patient Education [employer]

## 2015-09-22 NOTE — Progress Notes (Signed)
[facility] Consultation     Date: 09/22/2015,   MRN# [id number] Hannah Powell 06/05/1945 Code Status:  Code Status History    Date Active Date Inactive Code Status Order ID Comments User Context   07/18/2015  1:24 PM 07/22/2015  3:51 PM Full Code [id number]  Flora Lipps, MD ED   07/02/2013 11:24 AM 07/03/2013  1:35 PM Full Code [id number]  Jenetta Loges, PA-C Inpatient     Hosp day:@LENGTHOFSTAYDAYS @ Referring MD: @ATDPROV @     PCP:      AdmissionWeight: 271 lb (122.925 kg)                 CurrentWeight: 271 lb (122.925 kg)  CHIEF COMPLAINT:   Hospital follow up for resp failure-resolved   HISTORY OF PRESENT ILLNESS   currently Doing well overall, doing well with Breo No SOB, no chest pain No signs of infection at this time  Patient is remote smoker, quit 1977 Patient uses CPAP for OSA(reports 12 cm h2o) ONO results positive for hypoxia  PFT 09/13/15 Ratio 72% Fev1 52% Fef25/75 42%  Assessment-mild obstructive airways disease  Home Medication:   Current Medication:   Current outpatient prescriptions:  .  aspirin EC 81 MG tablet, Take 81 mg by mouth daily., Disp: , Rfl:  .  B Complex-C (B-COMPLEX WITH VITAMIN C) tablet, Take 1 tablet by mouth daily., Disp: , Rfl:  .  Calcium Carbonate-Vitamin D (CALCIUM-VITAMIN D) 500-200 MG-UNIT per tablet, Take 1 tablet by mouth daily., Disp: , Rfl:  .  chlorpheniramine-HYDROcodone (TUSSIONEX PENNKINETIC ER) 10-8 MG/5ML SUER, Take 5 mLs by mouth 2 (two) times daily. (Patient taking differently: Take 5 mLs by mouth 2 (two) times daily as needed. ), Disp: 140 mL, Rfl: 0 .  Fluticasone Furoate-Vilanterol (BREO ELLIPTA) 100-25 MCG/INH AEPB, Inhale 1 puff into the lungs daily., Disp: , Rfl:  .  furosemide (LASIX) 40 MG tablet, Take 1 tablet (40 mg total) by mouth daily., Disp: 30 tablet, Rfl: 5 .  guaiFENesin (MUCINEX) 600 MG 12 hr tablet, Take 1 tablet (600 mg total) by mouth 2 (two) times daily., Disp: 20 tablet, Rfl:  2 .  guaiFENesin-codeine 100-10 MG/5ML syrup, Take 10 mLs by mouth every 4 (four) hours as needed for cough., Disp: 120 mL, Rfl: 0 .  ipratropium-albuterol (DUONEB) 0.5-2.5 (3) MG/3ML SOLN, Take 3 mLs by nebulization every 4 (four) hours. (Patient taking differently: Take 3 mLs by nebulization every 4 (four) hours as needed. ), Disp: 360 mL, Rfl: 5 .  levothyroxine (SYNTHROID, LEVOTHROID) 200 MCG tablet, Take 200 mcg by mouth daily before breakfast., Disp: , Rfl:  .  losartan (COZAAR) 25 MG tablet, Take 25 mg by mouth daily., Disp: , Rfl:  .  Omega-3 Fatty Acids (OMEGA 3 PO), Take 1 capsule by mouth daily., Disp: , Rfl:  .  pramipexole (MIRAPEX) 1 MG tablet, Take 4 mg by mouth at bedtime. , Disp: , Rfl:      ALLERGIES   Lyrica     REVIEW OF SYSTEMS   Review of Systems  Constitutional: Negative for fever, chills, weight loss, malaise/fatigue and diaphoresis.  HENT: Positive for congestion.   Eyes: Negative for blurred vision and double vision.  Respiratory: Negative for cough, hemoptysis, sputum production, shortness of breath and wheezing.   Cardiovascular: Positive for leg swelling. Negative for chest pain, palpitations and orthopnea.  Gastrointestinal: Negative for heartburn, nausea, vomiting and abdominal pain.  Neurological: Negative for weakness.  All other systems reviewed and are negative.  VS: BP 142/80 mmHg  Pulse 95  Ht 5\' 5"  (1.651 m)  Wt 271 lb (122.925 kg)  BMI 45.10 kg/m2  SpO2 91%     PHYSICAL EXAM   Physical Exam  Constitutional: She is oriented to person, place, and time. She appears well-developed and well-nourished. No distress.  HENT:  Mouth/Throat: No oropharyngeal exudate.  Cardiovascular: Normal rate, regular rhythm and normal heart sounds.   No murmur heard. Pulmonary/Chest: Effort normal and breath sounds normal. No stridor. No respiratory distress. She has no wheezes. She has no rales.  Musculoskeletal: Normal range of motion. She exhibits  edema.  Neurological: She is alert and oriented to person, place, and time. No cranial nerve deficit.  Skin: Skin is warm. She is not diaphoretic.  Psychiatric: She has a normal mood and affect.         ASSESSMENT/PLAN   70 yo white female with MILD COPD Grade A with obesity and underlying OSA with nocturnal hypoxia  1.continue CPAP therapy for OSA with oxygen 2.continue inhaled steroids/LABA(BREO) 3..dounebs every 6 hrs as needed  Follow up in 3 months   The Patient requires high complexity decision making for assessment and support, frequent evaluation and titration of therapies, application of advanced monitoring technologies and extensive interpretation of multiple databases.  Patient/Family are satisfied with Plan of action and management. All questions answered   Corrin Parker, M.D.  [facility]  Medical Director [place] Director [facility] Cardio-Pulmonary Department

## 2015-09-27 DIAGNOSIS — N3946 Mixed incontinence: Secondary | ICD-10-CM | POA: Diagnosis not present

## 2015-10-03 DIAGNOSIS — J438 Other emphysema: Secondary | ICD-10-CM | POA: Diagnosis not present

## 2015-10-03 DIAGNOSIS — I10 Essential (primary) hypertension: Secondary | ICD-10-CM | POA: Diagnosis not present

## 2015-10-17 DIAGNOSIS — E7800 Pure hypercholesterolemia, unspecified: Secondary | ICD-10-CM | POA: Diagnosis not present

## 2015-10-17 DIAGNOSIS — I10 Essential (primary) hypertension: Secondary | ICD-10-CM | POA: Diagnosis not present

## 2015-10-17 DIAGNOSIS — M4696 Unspecified inflammatory spondylopathy, lumbar region: Secondary | ICD-10-CM | POA: Diagnosis not present

## 2015-10-17 DIAGNOSIS — E034 Atrophy of thyroid (acquired): Secondary | ICD-10-CM | POA: Diagnosis not present

## 2015-10-17 DIAGNOSIS — M1710 Unilateral primary osteoarthritis, unspecified knee: Secondary | ICD-10-CM | POA: Diagnosis not present

## 2015-10-17 DIAGNOSIS — G4733 Obstructive sleep apnea (adult) (pediatric): Secondary | ICD-10-CM | POA: Diagnosis not present

## 2015-10-17 DIAGNOSIS — G2581 Restless legs syndrome: Secondary | ICD-10-CM | POA: Diagnosis not present

## 2015-10-17 DIAGNOSIS — J449 Chronic obstructive pulmonary disease, unspecified: Secondary | ICD-10-CM | POA: Diagnosis not present

## 2015-10-17 DIAGNOSIS — M4806 Spinal stenosis, lumbar region: Secondary | ICD-10-CM | POA: Diagnosis not present

## 2015-10-17 DIAGNOSIS — G629 Polyneuropathy, unspecified: Secondary | ICD-10-CM | POA: Diagnosis not present

## 2015-10-20 DIAGNOSIS — M25551 Pain in right hip: Secondary | ICD-10-CM | POA: Diagnosis not present

## 2015-10-20 DIAGNOSIS — M545 Low back pain: Secondary | ICD-10-CM | POA: Diagnosis not present

## 2015-10-20 DIAGNOSIS — M7061 Trochanteric bursitis, right hip: Secondary | ICD-10-CM | POA: Diagnosis not present

## 2015-10-20 DIAGNOSIS — S82032D Displaced transverse fracture of left patella, subsequent encounter for closed fracture with routine healing: Secondary | ICD-10-CM | POA: Diagnosis not present

## 2015-10-20 DIAGNOSIS — S82002D Unspecified fracture of left patella, subsequent encounter for closed fracture with routine healing: Secondary | ICD-10-CM | POA: Diagnosis not present

## 2015-10-28 DIAGNOSIS — I10 Essential (primary) hypertension: Secondary | ICD-10-CM | POA: Diagnosis not present

## 2015-10-28 DIAGNOSIS — M4696 Unspecified inflammatory spondylopathy, lumbar region: Secondary | ICD-10-CM | POA: Diagnosis not present

## 2015-10-28 DIAGNOSIS — I5032 Chronic diastolic (congestive) heart failure: Secondary | ICD-10-CM | POA: Diagnosis not present

## 2015-10-28 DIAGNOSIS — J438 Other emphysema: Secondary | ICD-10-CM | POA: Diagnosis not present

## 2015-11-17 DIAGNOSIS — J449 Chronic obstructive pulmonary disease, unspecified: Secondary | ICD-10-CM | POA: Diagnosis not present

## 2015-11-29 DIAGNOSIS — J438 Other emphysema: Secondary | ICD-10-CM | POA: Diagnosis not present

## 2015-11-29 DIAGNOSIS — G4733 Obstructive sleep apnea (adult) (pediatric): Secondary | ICD-10-CM | POA: Diagnosis not present

## 2015-11-29 DIAGNOSIS — Z6841 Body Mass Index (BMI) 40.0 and over, adult: Secondary | ICD-10-CM | POA: Diagnosis not present

## 2015-11-29 DIAGNOSIS — I5032 Chronic diastolic (congestive) heart failure: Secondary | ICD-10-CM | POA: Diagnosis not present

## 2015-11-29 DIAGNOSIS — J9601 Acute respiratory failure with hypoxia: Secondary | ICD-10-CM | POA: Diagnosis not present

## 2015-11-29 DIAGNOSIS — M159 Polyosteoarthritis, unspecified: Secondary | ICD-10-CM | POA: Diagnosis not present

## 2015-11-29 DIAGNOSIS — R05 Cough: Secondary | ICD-10-CM | POA: Diagnosis not present

## 2015-12-13 DIAGNOSIS — Z23 Encounter for immunization: Secondary | ICD-10-CM | POA: Diagnosis not present

## 2015-12-13 DIAGNOSIS — G4733 Obstructive sleep apnea (adult) (pediatric): Secondary | ICD-10-CM | POA: Diagnosis not present

## 2015-12-13 DIAGNOSIS — E7800 Pure hypercholesterolemia, unspecified: Secondary | ICD-10-CM | POA: Diagnosis not present

## 2015-12-13 DIAGNOSIS — G629 Polyneuropathy, unspecified: Secondary | ICD-10-CM | POA: Diagnosis not present

## 2015-12-13 DIAGNOSIS — M4696 Unspecified inflammatory spondylopathy, lumbar region: Secondary | ICD-10-CM | POA: Diagnosis not present

## 2015-12-13 DIAGNOSIS — M159 Polyosteoarthritis, unspecified: Secondary | ICD-10-CM | POA: Diagnosis not present

## 2015-12-13 DIAGNOSIS — G2581 Restless legs syndrome: Secondary | ICD-10-CM | POA: Diagnosis not present

## 2015-12-13 DIAGNOSIS — I5032 Chronic diastolic (congestive) heart failure: Secondary | ICD-10-CM | POA: Diagnosis not present

## 2015-12-13 DIAGNOSIS — J438 Other emphysema: Secondary | ICD-10-CM | POA: Diagnosis not present

## 2015-12-13 DIAGNOSIS — Z6841 Body Mass Index (BMI) 40.0 and over, adult: Secondary | ICD-10-CM | POA: Diagnosis not present

## 2015-12-17 DIAGNOSIS — J449 Chronic obstructive pulmonary disease, unspecified: Secondary | ICD-10-CM | POA: Diagnosis not present

## 2015-12-30 DIAGNOSIS — J438 Other emphysema: Secondary | ICD-10-CM | POA: Diagnosis not present

## 2015-12-30 DIAGNOSIS — M48062 Spinal stenosis, lumbar region with neurogenic claudication: Secondary | ICD-10-CM | POA: Diagnosis not present

## 2015-12-30 DIAGNOSIS — I5032 Chronic diastolic (congestive) heart failure: Secondary | ICD-10-CM | POA: Diagnosis not present

## 2015-12-30 DIAGNOSIS — M4696 Unspecified inflammatory spondylopathy, lumbar region: Secondary | ICD-10-CM | POA: Diagnosis not present

## 2015-12-30 DIAGNOSIS — J441 Chronic obstructive pulmonary disease with (acute) exacerbation: Secondary | ICD-10-CM | POA: Diagnosis not present

## 2016-01-10 ENCOUNTER — Encounter: Payer: Self-pay | Admitting: Internal Medicine

## 2016-01-10 ENCOUNTER — Ambulatory Visit (INDEPENDENT_AMBULATORY_CARE_PROVIDER_SITE_OTHER): Payer: Commercial Managed Care - HMO | Admitting: Internal Medicine

## 2016-01-10 VITALS — BP 140/64 | HR 85 | Ht 65.0 in | Wt 273.0 lb

## 2016-01-10 DIAGNOSIS — J441 Chronic obstructive pulmonary disease with (acute) exacerbation: Secondary | ICD-10-CM | POA: Diagnosis not present

## 2016-01-10 MED ORDER — PREDNISONE 20 MG PO TABS: 20.0000 mg | ORAL_TABLET | Freq: Every day | ORAL | 0 refills | Status: DC

## 2016-01-10 NOTE — Progress Notes (Signed)
[facility] Consultation     Date: 01/10/2016,   MRN# [id number] Jarrett Kortan 08/30/1945 Code Status:  Code Status History    Date Active Date Inactive Code Status Order ID Comments User Context   07/18/2015  1:24 PM 07/22/2015  3:51 PM Full Code [id number]  Flora Lipps, MD ED   07/02/2013 11:24 AM 07/03/2013  1:35 PM Full Code [id number]  Jenetta Loges, PA-C Inpatient     Hosp day:@LENGTHOFSTAYDAYS @ Referring MD: @ATDPROV @     PCP:      AdmissionWeight: 273 lb (123.8 kg)                 CurrentWeight: 273 lb (123.8 kg)  CHIEF COMPLAINT:   Follow up COPD   HISTORY OF PRESENT ILLNESS   Currently having mild COPD exacerbation Finished 7 days of prednisone 40 mg daily Completed levaquin Has some SOB, cough, and wheezing   Patient is remote smoker, quit 1977 Patient uses CPAP for OSA(reports 12 cm h2o) ONO results positive for hypoxia  PFT 09/13/15 Ratio 72% Fev1 52% Fef25/75 42%  Assessment-mod obstructive airways disease  Home Medication:   Current Medication:   Current Outpatient Prescriptions:  .  aspirin EC 81 MG tablet, Take 81 mg by mouth daily., Disp: , Rfl:  .  B Complex-C (B-COMPLEX WITH VITAMIN C) tablet, Take 1 tablet by mouth daily., Disp: , Rfl:  .  Calcium Carbonate-Vitamin D (CALCIUM-VITAMIN D) 500-200 MG-UNIT per tablet, Take 1 tablet by mouth daily., Disp: , Rfl:  .  chlorpheniramine-HYDROcodone (TUSSIONEX PENNKINETIC ER) 10-8 MG/5ML SUER, Take 5 mLs by mouth 2 (two) times daily. (Patient taking differently: Take 5 mLs by mouth 2 (two) times daily as needed. ), Disp: 140 mL, Rfl: 0 .  Fluticasone Furoate-Vilanterol (BREO ELLIPTA) 100-25 MCG/INH AEPB, Inhale 1 puff into the lungs daily., Disp: , Rfl:  .  furosemide (LASIX) 40 MG tablet, Take 1 tablet (40 mg total) by mouth daily., Disp: 30 tablet, Rfl: 5 .  guaiFENesin (MUCINEX) 600 MG 12 hr tablet, Take 1 tablet (600 mg total) by mouth 2 (two) times daily., Disp: 20 tablet, Rfl:  2 .  guaiFENesin-codeine 100-10 MG/5ML syrup, Take 10 mLs by mouth every 4 (four) hours as needed for cough., Disp: 120 mL, Rfl: 0 .  ipratropium-albuterol (DUONEB) 0.5-2.5 (3) MG/3ML SOLN, Take 3 mLs by nebulization every 4 (four) hours. (Patient taking differently: Take 3 mLs by nebulization every 4 (four) hours as needed. ), Disp: 360 mL, Rfl: 5 .  levothyroxine (SYNTHROID, LEVOTHROID) 200 MCG tablet, Take 200 mcg by mouth daily before breakfast., Disp: , Rfl:  .  losartan (COZAAR) 25 MG tablet, Take 25 mg by mouth daily., Disp: , Rfl:  .  Omega-3 Fatty Acids (OMEGA 3 PO), Take 1 capsule by mouth daily., Disp: , Rfl:  .  pramipexole (MIRAPEX) 1 MG tablet, Take 4 mg by mouth at bedtime. , Disp: , Rfl:  .  zolpidem (AMBIEN) 10 MG tablet, Take by mouth., Disp: , Rfl:      ALLERGIES   Lyrica [pregabalin]     REVIEW OF SYSTEMS   Review of Systems  Constitutional: Negative for chills, diaphoresis, fever, malaise/fatigue and weight loss.  HENT: Positive for congestion.   Respiratory: Positive for wheezing. Negative for cough, hemoptysis, sputum production and shortness of breath.   Cardiovascular: Positive for leg swelling. Negative for chest pain, palpitations and orthopnea.  Gastrointestinal: Negative for abdominal pain, heartburn, nausea and vomiting.  Neurological: Negative for weakness.  All  other systems reviewed and are negative.    VS: BP 140/64 (BP Location: Left Wrist, Cuff Size: Normal)   Pulse 85   Ht 5\' 5"  (1.651 m)   Wt 273 lb (123.8 kg)   SpO2 91%   BMI 45.43 kg/m      PHYSICAL EXAM   Physical Exam  Constitutional: She is oriented to person, place, and time. She appears well-developed and well-nourished. No distress.  HENT:  Mouth/Throat: No oropharyngeal exudate.  Cardiovascular: Normal rate, regular rhythm and normal heart sounds.   No murmur heard. Pulmonary/Chest: Effort normal. No stridor. No respiratory distress. She has wheezes. She has no rales.   Musculoskeletal: Normal range of motion. She exhibits edema.  Neurological: She is alert and oriented to person, place, and time. No cranial nerve deficit.  Skin: Skin is warm. She is not diaphoretic.  Psychiatric: She has a normal mood and affect.         ASSESSMENT/PLAN   70 yo white female with Moderate COPD Grade B with obesity and underlying OSA with nocturnal hypoxia with chronic resp failure now with acute MILD copd exacerbation  1.continue CPAP therapy for OSA with oxygen 2.continue inhaled steroids/LABA(BREO) 3..dounebs every 4 hrs for next 2 days 4.will prescribed 40 mg prednisone for another 3 days(patient already had 7 days of prednisone)  Follow up in 3 months   The Patient requires high complexity decision making for assessment and support, frequent evaluation and titration of therapies, application of advanced monitoring technologies and extensive interpretation of multiple databases.  Patient/Family are satisfied with Plan of action and management. All questions answered   Corrin Parker, M.D.  [facility]  Medical Director [place] Director [facility] Cardio-Pulmonary Department

## 2016-01-10 NOTE — Patient Instructions (Signed)
Prednisone 40 mg for 3 more days Dounebs every 4 hrs for next 2 days  Chronic Obstructive Pulmonary Disease Chronic obstructive pulmonary disease (COPD) is a common lung condition in which airflow from the lungs is limited. COPD is a general term that can be used to describe many different lung problems that limit airflow, including both chronic bronchitis and emphysema. If you have COPD, your lung function will probably never return to normal, but there are measures you can take to improve lung function and make yourself feel better. CAUSES   Smoking (common).  Exposure to secondhand smoke.  Genetic problems.  Chronic inflammatory lung diseases or recurrent infections. SYMPTOMS  Shortness of breath, especially with physical activity.  Deep, persistent (chronic) cough with a large amount of thick mucus.  Wheezing.  Rapid breaths (tachypnea).  Gray or bluish discoloration (cyanosis) of the skin, especially in your fingers, toes, or lips.  Fatigue.  Weight loss.  Frequent infections or episodes when breathing symptoms become much worse (exacerbations).  Chest tightness. DIAGNOSIS Your health care provider will take a medical history and perform a physical examination to diagnose COPD. Additional tests for COPD may include:  Lung (pulmonary) function tests.  Chest X-ray.  CT scan.  Blood tests. TREATMENT  Treatment for COPD may include:  Inhaler and nebulizer medicines. These help manage the symptoms of COPD and make your breathing more comfortable.  Supplemental oxygen. Supplemental oxygen is only helpful if you have a low oxygen level in your blood.  Exercise and physical activity. These are beneficial for nearly all people with COPD.  Lung surgery or transplant.  Nutrition therapy to gain weight, if you are underweight.  Pulmonary rehabilitation. This may involve working with a team of health care providers and specialists, such as respiratory, occupational,  and physical therapists. HOME CARE INSTRUCTIONS  Take all medicines (inhaled or pills) as directed by your health care provider.  Avoid over-the-counter medicines or cough syrups that dry up your airway (such as antihistamines) and slow down the elimination of secretions unless instructed otherwise by your health care provider.  If you are a smoker, the most important thing that you can do is stop smoking. Continuing to smoke will cause further lung damage and breathing trouble. Ask your health care provider for help with quitting smoking. He or she can direct you to community resources or hospitals that provide support.  Avoid exposure to irritants such as smoke, chemicals, and fumes that aggravate your breathing.  Use oxygen therapy and pulmonary rehabilitation if directed by your health care provider. If you require home oxygen therapy, ask your health care provider whether you should purchase a pulse oximeter to measure your oxygen level at home.  Avoid contact with individuals who have a contagious illness.  Avoid extreme temperature and humidity changes.  Eat healthy foods. Eating smaller, more frequent meals and resting before meals may help you maintain your strength.  Stay active, but balance activity with periods of rest. Exercise and physical activity will help you maintain your ability to do things you want to do.  Preventing infection and hospitalization is very important when you have COPD. Make sure to receive all the vaccines your health care provider recommends, especially the pneumococcal and influenza vaccines. Ask your health care provider whether you need a pneumonia vaccine.  Learn and use relaxation techniques to manage stress.  Learn and use controlled breathing techniques as directed by your health care provider. Controlled breathing techniques include:  Pursed lip breathing. Start by breathing  in (inhaling) through your nose for 1 second. Then, purse your lips as  if you were going to whistle and breathe out (exhale) through the pursed lips for 2 seconds.  Diaphragmatic breathing. Start by putting one hand on your abdomen just above your waist. Inhale slowly through your nose. The hand on your abdomen should move out. Then purse your lips and exhale slowly. You should be able to feel the hand on your abdomen moving in as you exhale.  Learn and use controlled coughing to clear mucus from your lungs. Controlled coughing is a series of short, progressive coughs. The steps of controlled coughing are: 1. Lean your head slightly forward. 2. Breathe in deeply using diaphragmatic breathing. 3. Try to hold your breath for 3 seconds. 4. Keep your mouth slightly open while coughing twice. 5. Spit any mucus out into a tissue. 6. Rest and repeat the steps once or twice as needed. SEEK MEDICAL CARE IF:  You are coughing up more mucus than usual.  There is a change in the color or thickness of your mucus.  Your breathing is more labored than usual.  Your breathing is faster than usual. SEEK IMMEDIATE MEDICAL CARE IF:  You have shortness of breath while you are resting.  You have shortness of breath that prevents you from:  Being able to talk.  Performing your usual physical activities.  You have chest pain lasting longer than 5 minutes.  Your skin color is more cyanotic than usual.  You measure low oxygen saturations for longer than 5 minutes with a pulse oximeter. MAKE SURE YOU:  Understand these instructions.  Will watch your condition.  Will get help right away if you are not doing well or get worse.   This information is not intended to replace advice given to you by your health care provider. Make sure you discuss any questions you have with your health care provider.   Document Released: 12/13/2004 Document Revised: 03/26/2014 Document Reviewed: 10/30/2012 Elsevier Interactive Patient Education [employer].

## 2016-01-12 ENCOUNTER — Telehealth: Payer: Self-pay | Admitting: Internal Medicine

## 2016-01-12 MED ORDER — LEVOFLOXACIN 500 MG PO TABS: 500.0000 mg | ORAL_TABLET | Freq: Every day | ORAL | 0 refills | Status: AC

## 2016-01-12 NOTE — Telephone Encounter (Signed)
Per [facility]K sending in Levaquin 500mg  daily x 3 days. RX sent. Pt informed. Nothing further needed.

## 2016-01-12 NOTE — Telephone Encounter (Signed)
Patient was in yesterday to see Kasa.  Patient says she may need to start another round of Levaquin.  Patient has a productive green sputum started this morning.  Please call.

## 2016-01-12 NOTE — Telephone Encounter (Signed)
Please advise on message below. Pt was seen on 01/12/16. OV note states finished Prednisone 40mg  daily x 7 days and Levaquin. You gave Prednisone at visit. Please advise.

## 2016-01-12 NOTE — Telephone Encounter (Signed)
3 more days of levaquin please

## 2016-01-17 DIAGNOSIS — J449 Chronic obstructive pulmonary disease, unspecified: Secondary | ICD-10-CM | POA: Diagnosis not present

## 2016-01-20 ENCOUNTER — Other Ambulatory Visit
Admission: RE | Admit: 2016-01-20 | Discharge: 2016-01-20 | Disposition: A | Discharge status: Home / Self Care | Payer: Commercial Managed Care - HMO | Source: Ambulatory Visit | Attending: Physician Assistant | Admitting: Physician Assistant

## 2016-01-20 DIAGNOSIS — R0602 Shortness of breath: Secondary | ICD-10-CM | POA: Diagnosis not present

## 2016-01-20 DIAGNOSIS — J438 Other emphysema: Secondary | ICD-10-CM | POA: Diagnosis not present

## 2016-01-20 DIAGNOSIS — I5032 Chronic diastolic (congestive) heart failure: Secondary | ICD-10-CM | POA: Diagnosis not present

## 2016-01-20 DIAGNOSIS — I10 Essential (primary) hypertension: Secondary | ICD-10-CM | POA: Diagnosis not present

## 2016-01-20 DIAGNOSIS — R05 Cough: Secondary | ICD-10-CM | POA: Diagnosis not present

## 2016-01-20 LAB — BRAIN NATRIURETIC PEPTIDE: B NATRIURETIC PEPTIDE 5: 355 pg/mL — AB (ref 0.0–100.0)

## 2016-01-24 DIAGNOSIS — J9611 Chronic respiratory failure with hypoxia: Secondary | ICD-10-CM | POA: Diagnosis not present

## 2016-01-24 DIAGNOSIS — J438 Other emphysema: Secondary | ICD-10-CM | POA: Diagnosis not present

## 2016-01-24 DIAGNOSIS — R05 Cough: Secondary | ICD-10-CM | POA: Diagnosis not present

## 2016-01-24 DIAGNOSIS — M4696 Unspecified inflammatory spondylopathy, lumbar region: Secondary | ICD-10-CM | POA: Diagnosis not present

## 2016-01-25 ENCOUNTER — Ambulatory Visit (INDEPENDENT_AMBULATORY_CARE_PROVIDER_SITE_OTHER): Payer: Commercial Managed Care - HMO | Admitting: Internal Medicine

## 2016-01-25 ENCOUNTER — Encounter: Payer: Self-pay | Admitting: Internal Medicine

## 2016-01-25 VITALS — BP 132/70 | HR 87 | Wt 265.0 lb

## 2016-01-25 DIAGNOSIS — J441 Chronic obstructive pulmonary disease with (acute) exacerbation: Secondary | ICD-10-CM | POA: Diagnosis not present

## 2016-01-25 DIAGNOSIS — J449 Chronic obstructive pulmonary disease, unspecified: Secondary | ICD-10-CM

## 2016-01-25 MED ORDER — ROFLUMILAST 500 MCG PO TABS: 500.0000 ug | ORAL_TABLET | Freq: Every day | ORAL | 1 refills | Status: DC

## 2016-01-25 MED ORDER — ROFLUMILAST 500 MCG PO TABS: 500.0000 ug | ORAL_TABLET | Freq: Every day | ORAL | 5 refills | Status: DC

## 2016-01-25 NOTE — Patient Instructions (Signed)
Start DALIRESP Continue inhaled meds as prescribed Finish up steroids and abx as prescribed  Chronic Obstructive Pulmonary Disease Chronic obstructive pulmonary disease (COPD) is a common lung condition in which airflow from the lungs is limited. COPD is a general term that can be used to describe many different lung problems that limit airflow, including both chronic bronchitis and emphysema. If you have COPD, your lung function will probably never return to normal, but there are measures you can take to improve lung function and make yourself feel better. CAUSES   Smoking (common).  Exposure to secondhand smoke.  Genetic problems.  Chronic inflammatory lung diseases or recurrent infections. SYMPTOMS  Shortness of breath, especially with physical activity.  Deep, persistent (chronic) cough with a large amount of thick mucus.  Wheezing.  Rapid breaths (tachypnea).  Gray or bluish discoloration (cyanosis) of the skin, especially in your fingers, toes, or lips.  Fatigue.  Weight loss.  Frequent infections or episodes when breathing symptoms become much worse (exacerbations).  Chest tightness. DIAGNOSIS Your health care provider will take a medical history and perform a physical examination to diagnose COPD. Additional tests for COPD may include:  Lung (pulmonary) function tests.  Chest X-ray.  CT scan.  Blood tests. TREATMENT  Treatment for COPD may include:  Inhaler and nebulizer medicines. These help manage the symptoms of COPD and make your breathing more comfortable.  Supplemental oxygen. Supplemental oxygen is only helpful if you have a low oxygen level in your blood.  Exercise and physical activity. These are beneficial for nearly all people with COPD.  Lung surgery or transplant.  Nutrition therapy to gain weight, if you are underweight.  Pulmonary rehabilitation. This may involve working with a team of health care providers and specialists, such as  respiratory, occupational, and physical therapists. HOME CARE INSTRUCTIONS  Take all medicines (inhaled or pills) as directed by your health care provider.  Avoid over-the-counter medicines or cough syrups that dry up your airway (such as antihistamines) and slow down the elimination of secretions unless instructed otherwise by your health care provider.  If you are a smoker, the most important thing that you can do is stop smoking. Continuing to smoke will cause further lung damage and breathing trouble. Ask your health care provider for help with quitting smoking. He or she can direct you to community resources or hospitals that provide support.  Avoid exposure to irritants such as smoke, chemicals, and fumes that aggravate your breathing.  Use oxygen therapy and pulmonary rehabilitation if directed by your health care provider. If you require home oxygen therapy, ask your health care provider whether you should purchase a pulse oximeter to measure your oxygen level at home.  Avoid contact with individuals who have a contagious illness.  Avoid extreme temperature and humidity changes.  Eat healthy foods. Eating smaller, more frequent meals and resting before meals may help you maintain your strength.  Stay active, but balance activity with periods of rest. Exercise and physical activity will help you maintain your ability to do things you want to do.  Preventing infection and hospitalization is very important when you have COPD. Make sure to receive all the vaccines your health care provider recommends, especially the pneumococcal and influenza vaccines. Ask your health care provider whether you need a pneumonia vaccine.  Learn and use relaxation techniques to manage stress.  Learn and use controlled breathing techniques as directed by your health care provider. Controlled breathing techniques include:  Pursed lip breathing. Start by breathing in (  inhaling) through your nose for 1  second. Then, purse your lips as if you were going to whistle and breathe out (exhale) through the pursed lips for 2 seconds.  Diaphragmatic breathing. Start by putting one hand on your abdomen just above your waist. Inhale slowly through your nose. The hand on your abdomen should move out. Then purse your lips and exhale slowly. You should be able to feel the hand on your abdomen moving in as you exhale.  Learn and use controlled coughing to clear mucus from your lungs. Controlled coughing is a series of short, progressive coughs. The steps of controlled coughing are: 1. Lean your head slightly forward. 2. Breathe in deeply using diaphragmatic breathing. 3. Try to hold your breath for 3 seconds. 4. Keep your mouth slightly open while coughing twice. 5. Spit any mucus out into a tissue. 6. Rest and repeat the steps once or twice as needed. SEEK MEDICAL CARE IF:  You are coughing up more mucus than usual.  There is a change in the color or thickness of your mucus.  Your breathing is more labored than usual.  Your breathing is faster than usual. SEEK IMMEDIATE MEDICAL CARE IF:  You have shortness of breath while you are resting.  You have shortness of breath that prevents you from:  Being able to talk.  Performing your usual physical activities.  You have chest pain lasting longer than 5 minutes.  Your skin color is more cyanotic than usual.  You measure low oxygen saturations for longer than 5 minutes with a pulse oximeter. MAKE SURE YOU:  Understand these instructions.  Will watch your condition.  Will get help right away if you are not doing well or get worse.   This information is not intended to replace advice given to you by your health care provider. Make sure you discuss any questions you have with your health care provider.   Document Released: 12/13/2004 Document Revised: 03/26/2014 Document Reviewed: 10/30/2012 Elsevier Interactive Patient Education [employer]

## 2016-01-25 NOTE — Progress Notes (Signed)
[facility] Consultation     Date: 01/25/2016,   MRN# [id number] Jazabella Brush [birth year] Code Status:  Code Status History    Date Active Date Inactive Code Status Order ID Comments User Context   07/18/2015  1:24 PM 07/22/2015  3:51 PM Full Code [id number]  Flora Lipps, MD ED   07/02/2013 11:24 AM 07/03/2013  1:35 PM Full Code [id number]  Jenetta Loges, PA-C Inpatient     Hosp day:@LENGTHOFSTAYDAYS @ Referring MD: @ATDPROV @     PCP:      AdmissionWeight: 265 lb (120.2 kg)                 CurrentWeight: 265 lb (120.2 kg)  CHIEF COMPLAINT:   Follow up COPD   HISTORY OF PRESENT ILLNESS   LAST OV 10/24 had acute COPD exacerbation PRevious OV on 10/24 patient was given prednisone and ABX for Acute copd exacerbation  Patient Currently having acute  COPD exacerbation, PCP placed on prednisone and levaquin Still with some SOB, cough, and wheezing 02 sat is 82% on RA. Placed on oxygen   Patient is remote smoker, quit 1977 Patient uses CPAP for OSA(reports 12 cm h2o) -will need to obtain compliance report ONO results positive for hypoxia  PFT 09/13/15 Ratio 72% Fev1 52% Fef25/75 42%  Assessment-mod/severe obstructive airways disease  Home Medication:   Current Medication:   Current Outpatient Prescriptions:  .  aspirin EC 81 MG tablet, Take 81 mg by mouth daily., Disp: , Rfl:  .  B Complex-C (B-COMPLEX WITH VITAMIN C) tablet, Take 1 tablet by mouth daily., Disp: , Rfl:  .  Calcium Carbonate-Vitamin D (CALCIUM-VITAMIN D) 500-200 MG-UNIT per tablet, Take 1 tablet by mouth daily., Disp: , Rfl:  .  chlorpheniramine-HYDROcodone (TUSSIONEX PENNKINETIC ER) 10-8 MG/5ML SUER, Take 5 mLs by mouth 2 (two) times daily. (Patient taking differently: Take 5 mLs by mouth 2 (two) times daily as needed. ), Disp: 140 mL, Rfl: 0 .  Fluticasone Furoate-Vilanterol (BREO ELLIPTA) 100-25 MCG/INH AEPB, Inhale 1 puff into the lungs daily., Disp: , Rfl:  .  furosemide (LASIX) 40  MG tablet, Take 1 tablet (40 mg total) by mouth daily., Disp: 30 tablet, Rfl: 5 .  guaiFENesin (MUCINEX) 600 MG 12 hr tablet, Take 1 tablet (600 mg total) by mouth 2 (two) times daily., Disp: 20 tablet, Rfl: 2 .  guaiFENesin-codeine 100-10 MG/5ML syrup, Take 10 mLs by mouth every 4 (four) hours as needed for cough., Disp: 120 mL, Rfl: 0 .  ipratropium-albuterol (DUONEB) 0.5-2.5 (3) MG/3ML SOLN, Take 3 mLs by nebulization every 4 (four) hours. (Patient taking differently: Take 3 mLs by nebulization every 4 (four) hours as needed. ), Disp: 360 mL, Rfl: 5 .  levothyroxine (SYNTHROID, LEVOTHROID) 200 MCG tablet, Take 200 mcg by mouth daily before breakfast., Disp: , Rfl:  .  losartan (COZAAR) 25 MG tablet, Take 25 mg by mouth daily., Disp: , Rfl:  .  metolazone (ZAROXOLYN) 5 MG tablet, Take 5 mg by mouth daily., Disp: , Rfl:  .  Omega-3 Fatty Acids (OMEGA 3 PO), Take 1 capsule by mouth daily., Disp: , Rfl:  .  pramipexole (MIRAPEX) 1 MG tablet, Take 4 mg by mouth at bedtime. , Disp: , Rfl:  .  predniSONE (DELTASONE) 20 MG tablet, Take 1 tablet (20 mg total) by mouth daily with breakfast. 2 tablets daily for 3 days, Disp: 6 tablet, Rfl: 0 .  zolpidem (AMBIEN) 10 MG tablet, Take by mouth., Disp: , Rfl:  ALLERGIES   Lyrica [pregabalin]     REVIEW OF SYSTEMS   Review of Systems  Constitutional: Negative for chills, diaphoresis, fever, malaise/fatigue and weight loss.  HENT: Positive for congestion.   Respiratory: Positive for wheezing. Negative for cough, hemoptysis, sputum production and shortness of breath.   Cardiovascular: Positive for leg swelling. Negative for chest pain, palpitations and orthopnea.  Gastrointestinal: Negative for abdominal pain, heartburn, nausea and vomiting.  Neurological: Negative for weakness.  All other systems reviewed and are negative.    VS: BP 132/70 (BP Location: Left Arm, Cuff Size: Normal)   Pulse 87   Wt 265 lb (120.2 kg)   SpO2 92%   BMI 44.10  kg/m      PHYSICAL EXAM   Physical Exam  Constitutional: She is oriented to person, place, and time. She appears well-developed and well-nourished. No distress.  HENT:  Mouth/Throat: No oropharyngeal exudate.  Cardiovascular: Normal rate, regular rhythm and normal heart sounds.   No murmur heard. Pulmonary/Chest: Effort normal. No stridor. No respiratory distress. She has wheezes. She has no rales.  Musculoskeletal: Normal range of motion. She exhibits edema.  Neurological: She is alert and oriented to person, place, and time. No cranial nerve deficit.  Skin: Skin is warm. She is not diaphoretic.  Psychiatric: She has a normal mood and affect.         ASSESSMENT/PLAN   70 yo white female with Moderate COPD Grade C with obesity and underlying OSA with nocturnal hypoxia with chronic resp failure with recurrent bouts of COPD exacerbation and multiple prednisone therapies. She has at least 4 exacerbations in the last 6 months   1.continue CPAP therapy for OSA with oxygen 2.oxygen with exertion 3.continue inhaled steroids/LABA(BREO) 4.dounebs every 4 hrs as needed 5.continue prednisone taper and abx as prescribed by her PCP 6.will order Daliresp to prevent COPD exacerbations  Follow up in 1 months   The Patient requires high complexity decision making for assessment and support, frequent evaluation and titration of therapies, application of advanced monitoring technologies and extensive interpretation of multiple databases.  Patient/Family are satisfied with Plan of action and management. All questions answered   Corrin Parker, M.D.  [facility]  Medical Director [place] Director [facility] Cardio-Pulmonary Department

## 2016-01-26 ENCOUNTER — Telehealth: Payer: Self-pay | Admitting: Internal Medicine

## 2016-01-26 NOTE — Telephone Encounter (Signed)
Pt seen DK 01-25-16 and was started on daliresp, pt is currently on abx and prednisone prescribed by her PCP. Pt wanted to make sure that it was okay to take daliresp while on abx and prednisone. I explained to per OV note is it okay to do both. Pt voiced understanding & had no further questions. Nothing further needed.

## 2016-01-26 NOTE — Telephone Encounter (Signed)
Pt calling stating we started her on some new medicine and she would like to just make sure that she is taking them correctly  Please call back

## 2016-01-30 ENCOUNTER — Telehealth: Payer: Self-pay | Admitting: Internal Medicine

## 2016-01-30 NOTE — Telephone Encounter (Signed)
Pt is calling to just make sure that we received some paperwork from [place] for her Please call back if we did not receive anything from them

## 2016-01-30 NOTE — Telephone Encounter (Signed)
Informed pt that we have the paperwork from [place].

## 2016-02-03 DIAGNOSIS — R0902 Hypoxemia: Secondary | ICD-10-CM | POA: Diagnosis not present

## 2016-02-03 DIAGNOSIS — I509 Heart failure, unspecified: Secondary | ICD-10-CM | POA: Diagnosis not present

## 2016-02-03 DIAGNOSIS — J449 Chronic obstructive pulmonary disease, unspecified: Secondary | ICD-10-CM | POA: Diagnosis not present

## 2016-02-03 DIAGNOSIS — R0689 Other abnormalities of breathing: Secondary | ICD-10-CM | POA: Diagnosis not present

## 2016-02-06 ENCOUNTER — Telehealth: Payer: Self-pay | Admitting: Internal Medicine

## 2016-02-06 NOTE — Telephone Encounter (Signed)
Spoke with pt and she states she feels much better. Informed she can cont QID nebs per DK but pt states she will do them PRN. Nothing further needed.

## 2016-02-06 NOTE — Telephone Encounter (Signed)
Pt states she is doing much better, no coughing, breathing much better, she asks if it is ok that she stops the breathing treatments 4x a day. Please call and advise.

## 2016-02-06 NOTE — Telephone Encounter (Signed)
Yes that's ok

## 2016-02-06 NOTE — Telephone Encounter (Signed)
Spoke with pt who states she is really much better. Pt denies any cough, wheezing or chest tightness. Pt states she feels like a brand new person and would like to know if she needs to continue taking her neb treatments qid?  DK please advise. Thanks.

## 2016-02-06 NOTE — Telephone Encounter (Signed)
Lmtcb. Will await call back 

## 2016-02-08 DIAGNOSIS — S81002A Unspecified open wound, left knee, initial encounter: Secondary | ICD-10-CM | POA: Diagnosis not present

## 2016-02-16 ENCOUNTER — Telehealth: Payer: Self-pay | Admitting: Internal Medicine

## 2016-02-16 DIAGNOSIS — J449 Chronic obstructive pulmonary disease, unspecified: Secondary | ICD-10-CM | POA: Diagnosis not present

## 2016-02-16 MED ORDER — ROFLUMILAST 500 MCG PO TABS: 500.0000 ug | ORAL_TABLET | Freq: Every day | ORAL | 3 refills | Status: DC

## 2016-02-16 NOTE — Telephone Encounter (Signed)
Rx sent to preferred pharmacy. Pt aware & voiced understanding. Nothing further needed.  

## 2016-02-16 NOTE — Telephone Encounter (Signed)
Pt calling asking if we can send her presctiption to [facility] mail order Please send in Ethan  If we have any issues please call her

## 2016-02-17 DIAGNOSIS — S81002A Unspecified open wound, left knee, initial encounter: Secondary | ICD-10-CM | POA: Diagnosis not present

## 2016-02-17 DIAGNOSIS — J438 Other emphysema: Secondary | ICD-10-CM | POA: Diagnosis not present

## 2016-02-21 DIAGNOSIS — R29898 Other symptoms and signs involving the musculoskeletal system: Secondary | ICD-10-CM | POA: Diagnosis not present

## 2016-02-22 ENCOUNTER — Encounter: Payer: Self-pay | Admitting: Internal Medicine

## 2016-02-22 ENCOUNTER — Ambulatory Visit (INDEPENDENT_AMBULATORY_CARE_PROVIDER_SITE_OTHER): Payer: Commercial Managed Care - HMO | Admitting: Internal Medicine

## 2016-02-22 VITALS — BP 118/74 | HR 100 | Wt 255.0 lb

## 2016-02-22 DIAGNOSIS — J449 Chronic obstructive pulmonary disease, unspecified: Secondary | ICD-10-CM | POA: Diagnosis not present

## 2016-02-22 MED ORDER — FLUTICASONE FUROATE-VILANTEROL 100-25 MCG/INH IN AEPB: 1.0000 | INHALATION_SPRAY | Freq: Every day | RESPIRATORY_TRACT | 0 refills | Status: DC

## 2016-02-22 NOTE — Progress Notes (Signed)
[facility] Consultation     Date: 02/22/2016,   MRN# [id number] Hannah Powell 02/17/1946 Code Status:  Code Status History    Date Active Date Inactive Code Status Order ID Comments User Context   07/18/2015  1:24 PM 07/22/2015  3:51 PM Full Code [id number]  Flora Lipps, MD ED   07/02/2013 11:24 AM 07/03/2013  1:35 PM Full Code [id number]  Jenetta Loges, PA-C Inpatient     Hosp day:@LENGTHOFSTAYDAYS @ Referring MD: @ATDPROV @     PCP:      AdmissionWeight: 255 lb (115.7 kg)                 CurrentWeight: 255 lb (115.7 kg)  CHIEF COMPLAINT:   Follow up COPD   HISTORY OF PRESENT ILLNESS   Doing well on oxygen as needed and inhaled therapy No signs of infection at this time tolerating daliresp well, no exacerbation at this time  Patient is remote smoker, quit 1977 Patient uses CPAP for OSA(reports 12 cm h2o) ONO results positive for hypoxia  PFT 09/13/15 Ratio 72% Fev1 52% Fef25/75 42%  Assessment-mod/severe obstructive airways disease  Home Medication:   Current Medication:   Current Outpatient Prescriptions:  .  aspirin EC 81 MG tablet, Take 81 mg by mouth daily., Disp: , Rfl:  .  B Complex-C (B-COMPLEX WITH VITAMIN C) tablet, Take 1 tablet by mouth daily., Disp: , Rfl:  .  Calcium Carbonate-Vitamin D (CALCIUM-VITAMIN D) 500-200 MG-UNIT per tablet, Take 1 tablet by mouth daily., Disp: , Rfl:  .  chlorpheniramine-HYDROcodone (TUSSIONEX PENNKINETIC ER) 10-8 MG/5ML SUER, Take 5 mLs by mouth 2 (two) times daily. (Patient taking differently: Take 5 mLs by mouth 2 (two) times daily as needed. ), Disp: 140 mL, Rfl: 0 .  Fluticasone Furoate-Vilanterol (BREO ELLIPTA) 100-25 MCG/INH AEPB, Inhale 1 puff into the lungs daily., Disp: , Rfl:  .  furosemide (LASIX) 40 MG tablet, Take 1 tablet (40 mg total) by mouth daily., Disp: 30 tablet, Rfl: 5 .  guaiFENesin (MUCINEX) 600 MG 12 hr tablet, Take 1 tablet (600 mg total) by mouth 2 (two) times daily., Disp: 20  tablet, Rfl: 2 .  guaiFENesin-codeine 100-10 MG/5ML syrup, Take 10 mLs by mouth every 4 (four) hours as needed for cough., Disp: 120 mL, Rfl: 0 .  ipratropium-albuterol (DUONEB) 0.5-2.5 (3) MG/3ML SOLN, Take 3 mLs by nebulization every 4 (four) hours. (Patient taking differently: Take 3 mLs by nebulization every 4 (four) hours as needed. ), Disp: 360 mL, Rfl: 5 .  levothyroxine (SYNTHROID, LEVOTHROID) 200 MCG tablet, Take 200 mcg by mouth daily before breakfast., Disp: , Rfl:  .  losartan (COZAAR) 25 MG tablet, Take 25 mg by mouth daily., Disp: , Rfl:  .  metolazone (ZAROXOLYN) 5 MG tablet, Take 5 mg by mouth daily., Disp: , Rfl:  .  Omega-3 Fatty Acids (OMEGA 3 PO), Take 1 capsule by mouth daily., Disp: , Rfl:  .  pramipexole (MIRAPEX) 1 MG tablet, Take 4 mg by mouth at bedtime. , Disp: , Rfl:  .  predniSONE (DELTASONE) 20 MG tablet, Take 1 tablet (20 mg total) by mouth daily with breakfast. 2 tablets daily for 3 days, Disp: 6 tablet, Rfl: 0 .  roflumilast (DALIRESP) 500 MCG TABS tablet, Take 1 tablet (500 mcg total) by mouth daily., Disp: 90 tablet, Rfl: 3 .  zolpidem (AMBIEN) 10 MG tablet, Take by mouth., Disp: , Rfl:      ALLERGIES   Lyrica [pregabalin]     REVIEW  OF SYSTEMS   Review of Systems  Constitutional: Negative for chills, diaphoresis, fever, malaise/fatigue and weight loss.  HENT: Negative for congestion.   Eyes: Negative for blurred vision and double vision.  Respiratory: Negative for cough, hemoptysis, sputum production, shortness of breath and wheezing.   Cardiovascular: Positive for leg swelling. Negative for chest pain, palpitations and orthopnea.  Gastrointestinal: Negative for abdominal pain, heartburn, nausea and vomiting.  Neurological: Negative for weakness.  All other systems reviewed and are negative.    VS: BP 118/74 (BP Location: Left Arm, Cuff Size: Normal)   Pulse 100   Wt 255 lb (115.7 kg)   SpO2 93%   BMI 42.43 kg/m      PHYSICAL EXAM    Physical Exam  Constitutional: She is oriented to person, place, and time. She appears well-developed and well-nourished. No distress.  HENT:  Mouth/Throat: No oropharyngeal exudate.  Cardiovascular: Normal rate, regular rhythm and normal heart sounds.   No murmur heard. Pulmonary/Chest: Effort normal. No stridor. No respiratory distress. She has no wheezes. She has no rales.  Musculoskeletal: Normal range of motion. She exhibits edema.  Neurological: She is alert and oriented to person, place, and time. No cranial nerve deficit.  Skin: Skin is warm. She is not diaphoretic.  Psychiatric: She has a normal mood and affect.         ASSESSMENT/PLAN   70 yo white female with Moderate COPD Grade C with obesity and underlying OSA with nocturnal hypoxia with chronic resp failure with recurrent bouts of COPD exacerbation and multiple prednisone therapies. She has at least 4 exacerbations in the last 6 months   1.continue CPAP therapy for OSA with oxygen 2.oxygen with exertion 3.continue inhaled steroids/LABA(BREO) 4.dounebs every 4 hrs as needed 5.continue  Daliresp to prevent COPD exacerbations  Follow up in 6 months Overall, patient stabel from resp status, and doing well with her inhaler regimen    The Patient requires high complexity decision making for assessment and support, frequent evaluation and titration of therapies, application of advanced monitoring technologies and extensive interpretation of multiple databases.  Patient/Family are satisfied with Plan of action and management. All questions answered   Corrin Parker, M.D.  [facility]  Medical Director [place] Director [facility] Cardio-Pulmonary Department

## 2016-02-22 NOTE — Patient Instructions (Signed)
Continue oxygen as needed Inhalers as prescribed   Chronic Obstructive Pulmonary Disease Chronic obstructive pulmonary disease (COPD) is a common lung problem. In COPD, the flow of air from the lungs is limited. The way your lungs work will probably never return to normal, but there are things you can do to improve your lungs and make yourself feel better. Your doctor may treat your condition with:  Medicines.  Oxygen.  Lung surgery.  Changes to your diet.  Rehabilitation. This may involve a team of specialists. Follow these instructions at home:  Take all medicines as told by your doctor.  Avoid medicines or cough syrups that dry up your airway (such as antihistamines) and do not allow you to get rid of thick spit. You do not need to avoid them if told differently by your doctor.  If you smoke, stop. Smoking makes the problem worse.  Avoid being around things that make your breathing worse (like smoke, chemicals, and fumes).  Use oxygen therapy and therapy to help improve your lungs (pulmonary rehabilitation) if told by your doctor. If you need home oxygen therapy, ask your doctor if you should buy a tool to measure your oxygen level (oximeter).  Avoid people who have a sickness you can catch (contagious).  Avoid going outside when it is very hot, cold, or humid.  Eat healthy foods. Eat smaller meals more often. Rest before meals.  Stay active, but remember to also rest.  Make sure to get all the shots (vaccines) your doctor recommends. Ask your doctor if you need a pneumonia shot.  Learn and use tips on how to relax.  Learn and use tips on how to control your breathing as told by your doctor. Try: 1. Breathing in (inhaling) through your nose for 1 second. Then, pucker your lips and breath out (exhale) through your lips for 2 seconds. 2. Putting one hand on your belly (abdomen). Breathe in slowly through your nose for 1 second. Your hand on your belly should move out.  Pucker your lips and breathe out slowly through your lips. Your hand on your belly should move in as you breathe out.  Learn and use controlled coughing to clear thick spit from your lungs. The steps are: 1. Lean your head a little forward. 2. Breathe in deeply. 3. Try to hold your breath for 3 seconds. 4. Keep your mouth slightly open while coughing 2 times. 5. Spit any thick spit out into a tissue. 6. Rest and do the steps again 1 or 2 times as needed. Contact a doctor if:  You cough up more thick spit than usual.  There is a change in the color or thickness of the spit.  It is harder to breathe than usual.  Your breathing is faster than usual. Get help right away if:  You have shortness of breath while resting.  You have shortness of breath that stops you from:  Being able to talk.  Doing normal activities.  You chest hurts for longer than 5 minutes.  Your skin color is more blue than usual.  Your pulse oximeter shows that you have low oxygen for longer than 5 minutes. This information is not intended to replace advice given to you by your health care provider. Make sure you discuss any questions you have with your health care provider. Document Released: 08/22/2007 Document Revised: 08/11/2015 Document Reviewed: 10/30/2012 Elsevier Interactive Patient Education  2017 [employer].

## 2016-02-22 NOTE — Addendum Note (Signed)
Addended by: Oscar La R on: 02/22/2016 10:13 AM   Modules accepted: Orders

## 2016-03-01 ENCOUNTER — Ambulatory Visit: Payer: Commercial Managed Care - HMO | Attending: Neurology

## 2016-03-01 VITALS — BP 123/62 | HR 90

## 2016-03-01 DIAGNOSIS — M6281 Muscle weakness (generalized): Secondary | ICD-10-CM | POA: Insufficient documentation

## 2016-03-01 DIAGNOSIS — R2689 Other abnormalities of gait and mobility: Secondary | ICD-10-CM | POA: Diagnosis not present

## 2016-03-01 DIAGNOSIS — R2681 Unsteadiness on feet: Secondary | ICD-10-CM

## 2016-03-01 NOTE — Therapy (Signed)
[facility] [address] [place], [name], [address] Phone: [phone]   Fax:  [phone]  Physical Therapy Evaluation  Patient Details  Name: Hannah Powell MRN: [id number] Date of Birth: 10/16/1945 Referring Provider: Jennings Books  Encounter Date: 03/01/2016      PT End of Session - 03/01/16 1458    Visit Number 1   Number of Visits 17   Date for PT Re-Evaluation 05-10-16   Authorization Type g codes 1/10   PT Start Time 1440   PT Stop Time 1530   PT Time Calculation (min) 50 min   Equipment Utilized During Treatment Gait belt   Activity Tolerance Patient tolerated treatment well   Behavior During Therapy [facility] for tasks assessed/performed      Past Medical History:  Diagnosis Date  . Anxiety   . Arthritis   . CHF (congestive heart failure) ([place])   . Hypertension    dr Otho Najjar     . Hypothyroidism   . RLS (restless legs syndrome)   . Shortness of breath   . Sleep apnea    cpap      >5 yrs    Past Surgical History:  Procedure Laterality Date  . BACK SURGERY     cervical  . FOOT ARTHROPLASTY    . JOINT REPLACEMENT     x2 tkr  . TOTAL SHOULDER ARTHROPLASTY Left 07/02/2013   Procedure: LEFT TOTAL SHOULDER ARTHROPLASTY;  Surgeon: Marin Shutter, MD;  Location: [facility];  Service: Orthopedics;  Laterality: Left;    Vitals:   03/01/16 1455  BP: 123/62  Pulse: 90  SpO2: 96%         Subjective Assessment - 03/01/16 1445    Subjective Bilateral LE weakness   Pertinent History 70 yo female referred for PT evaluation for bilateral LE weakness. Pt reports that the weakness is gradually worsening. Pt has a history of balance deficits with 5 falls over the course of 2 weeks in 06/2015. Pt reports only one falls since that time. Pt is complaining of L hip weakness at this time. She has a history of left knee patella fracture, bilateral knee replacements, and a L shoulder replacement. She ambulates with axillary  crutches since 2013 after a L foot surgery. She has been actively working out with a trainer at [employer] but stopped approximately 1 month ago. Pt reports she went out of town, got sick, and then had financial constraints which prevented her from returning to her trainer. Pt a history of neuropathy in bilateral feet but reports it as mild. Pt denies other changes in her health recently. ROS negative for red flags.    How long can you sit comfortably? unlimited   How long can you stand comfortably? 3-4 hours   How long can you walk comfortably? 3-4 hours   Patient Stated Goals get rid of crutches and walk unassisted. "Increase my endurance." Pt reports primary limiting factor in walking endurance is use of crutches   Currently in Pain? No/denies            [facility] PT Assessment - 03/01/16 1447      Assessment   Medical Diagnosis Muscle weakness lower extremities   Referring Provider Hemang Shah   Onset Date/Surgical Date 04/02/11  Approximate   Hand Dominance Right   Next MD Visit Neuromuscular specialist in January 2018   Prior Therapy Yes, at [facility] and [facility]     Precautions  Precautions Fall     Restrictions   Weight Bearing Restrictions No     Balance Screen   Has the patient fallen in the past 6 months Yes   How many times? 1   Has the patient had a decrease in activity level because of a fear of falling?  No   Is the patient reluctant to leave their home because of a fear of falling?  No     Home Environment   Living Environment Private residence   Living Arrangements Spouse/significant other   Available Help at Discharge Family   Type of [place]   Additional Comments 5 steps to enter house with B rails; single level home; lives with husband; has multiple AD including WC, crutches, cane, walker, bedside commode, shower seat etc;      Prior Function   Level of Independence Independent with basic ADLs;Requires assistive device for  independence   Vocation Retired   [employer] Retired [employer], read, play with grandchildren     Cognition   Overall Cognitive Status Within Functional Limits for tasks assessed     Observation/Other Assessments   Other Surveys  --     Observation/Other Assessments-Edema    Edema --  bilateral LE edema noted     Sensation   Additional Comments reports numbness/tingling in BUE hands related to carpal tunnel; reports some bilateral LE neuropathy in feet but mild. Not tested at this time     Posture/Postural Control   Posture Comments slumped posture with rounded upper back, protracted shoulder and forward head     AROM   Overall AROM Comments BUE and BLE AROM is [facility]     Strength   Overall Strength Comments BUE gross strength is WNL   Right/Left Hip Right;Left   Right Hip Flexion 4/5   Right Hip ABduction 4/5   Right Hip ADduction 4/5   Left Hip Flexion 4/5   Left Hip ABduction 4/5   Left Hip ADduction 4/5   Right Knee Flexion 4+/5   Right Knee Extension 4+/5   Left Knee Flexion 4+/5   Left Knee Extension 4+/5   Right Ankle Dorsiflexion 4/5   Left Ankle Dorsiflexion 4/5     Palpation   Palpation comment Deferred, not related to current episode of care     Transfers   Comments Requires UE support for all transfers. Unable to perform sit to stand without heavy bilateral UE assist     Ambulation/Gait   Gait Comments ambulates with BUE crutches with distant supervision, slower gait speed, 4 point gait pattern with increased slumped posture;     6 Minute Walk- Baseline   6 Minute Walk- Baseline yes   BP (mmHg) 126/62   HR (bpm) 90   02 Sat (%RA) 96 %   Modified Borg Scale for Dyspnea 0- Nothing at all   Perceived Rate of Exertion (Borg) 6-     6 Minute walk- Post Test   6 Minute Walk Post Test yes   BP (mmHg) 126/71   HR (bpm) 93   02 Sat (%RA) 93 %   Modified Borg Scale for Dyspnea 1- Very mild shortness of breath   Perceived Rate of  Exertion (Borg) 8-     6 minute walk test results    Aerobic Endurance Distance Walked 345   Endurance additional comments with bilateral UE crutches;     Standardized Balance Assessment   Standardized Balance Assessment 10 meter walk test;Timed Up and  Go Test   Five times sit to stand comments  14.14 seconds  with BUE assist. Unable to performance without UE assist   10 Meter Walk 22.7 seconds; 0.44 m/s     Timed Up and Go Test   TUG Normal TUG   Normal TUG (seconds) 37.2                              PT Education - 03/01/16 1457    Education provided Yes   Education Details HEP, plan of care   Person(s) Educated Patient   Methods Explanation;Demonstration;Verbal cues;Handout   Comprehension Verbalized understanding;Returned demonstration             PT Long Term Goals - 03/01/16 1504      PT LONG TERM GOAL #1   Title Patient will be independent in home exercise program to improve strength/mobility for better functional independence with ADLs.   Time 8   Period Weeks   Status New     PT LONG TERM GOAL #2   Title Pt will decrease TUG by at least 5 seconds in order to demonstrate decreased fall risk    Baseline 03/01/16: 37.2 seconds   Time 8   Period Weeks   Status New     PT LONG TERM GOAL #3   Title Pt will increase 6MWT by at least 52m (178ft) in order to demonstrate clinically significant improvement in cardiopulmonary endurance and community ambulation   Baseline 03/01/16: 345'   Time 8   Period Weeks   Status New     PT LONG TERM GOAL #4   Title Pt will increase 10MWT speed by at least 0.13 m/s in order to demonstrate clinically significant improvement in community ambulation.   Baseline 03/01/16: 0.44 m/s   Time 8   Period Weeks   Status New     PT LONG TERM GOAL #5   Title --   Baseline --   Time --   Period --   Status --     PT LONG TERM GOAL #6   Title --   Baseline --   Time --   Period --   Status --     PT  LONG TERM GOAL #7   Title --   Baseline --   Time --   Period --   Status --               Plan - 03/01/16 1459    Clinical Impression Statement Pt is a pleasant 70 yo female referred for bilateral LE weakness. Pt presents today with bilateral LE weakness and poor endurance. 6 Minute Walk Test is 53' which is significantly below norms for age/gender. Pt unable to perform sit to stand without UE support. TUG is 37.2 seconds which significantly above cut-off for age/gender norms indiciating increased fall risk. 12m gait speed is also decreased at 0.44 m/s. Pt will benefit from skilled PT services to address deficits in strength, balance, and endurance in order to improve function at decrease risk for future falls.   Rehab Potential Good   Clinical Impairments Affecting Rehab Potential positive: motivation, family support, negative: co-morbidities, deconditioned, high fall risk; Body structures that will need to be addressed: weakness, numbness/tingling, impaired balance, difficulty walking, decreased transfer ability, etc; Due to co-morbidities her clinical presenstation is evolving;    PT Frequency 2x / week   PT Duration 8 weeks   PT Treatment/Interventions Aquatic Therapy;Cryotherapy;Dealer  Stimulation;Moist Heat;Balance training;Therapeutic exercise;Therapeutic activities;Functional mobility training;Stair training;Gait training;DME Instruction;Neuromuscular re-education;Patient/family education;Energy conservation;ADLs/Self Care Home Management;Ultrasound;Manual techniques;Passive range of motion   PT Next Visit Plan Progress balance and strengthening.    PT Home Exercise Plan To be initiated at next session.    Consulted and Agree with Plan of Care Patient      Patient will benefit from skilled therapeutic intervention in order to improve the following deficits and impairments:  Abnormal gait, Decreased endurance, Impaired sensation, Obesity, Cardiopulmonary status limiting  activity, Decreased activity tolerance, Decreased strength, Difficulty walking, Decreased mobility, Decreased balance, Impaired flexibility, Decreased safety awareness  Visit Diagnosis: Muscle weakness (generalized) - Plan: PT plan of care cert/re-cert  Other abnormalities of gait and mobility - Plan: PT plan of care cert/re-cert  Unsteadiness on feet - Plan: PT plan of care cert/re-cert      G-Codes - [id number]    Functional Assessment Tool Used 5TSTS, 72m walk time, TUG, 6 minute walk test, clinical judgement   Functional Limitation Mobility: Walking and moving around   Mobility: Walking and Moving Around Current Status [id number]) At least 40 percent but less than 60 percent impaired, limited or restricted   Mobility: Walking and Moving Around Goal Status [phone]) At least 20 percent but less than 40 percent impaired, limited or restricted       Problem List Patient Active Problem List   Diagnosis Date Noted  . Acute on chronic diastolic CHF (congestive heart failure) ([place]) 07/21/2015  . COPD exacerbation ([place]) 07/21/2015  . Acute renal insufficiency 07/21/2015  . Hyperglycemia 07/21/2015  . Benign essential HTN 07/21/2015  . Acute on chronic respiratory failure with hypoxia and hypercapnia ([place]) 07/18/2015  . S/P shoulder replacement 07/02/2013   Phillips Grout PT, DPT   Hannah Powell 03/02/2016, 10:49 AM  [facility] [address] [place], [name], [address] Phone: [phone]   Fax:  [phone]  Name: Kaleb Dimeglio MRN: [id number] Date of Birth: July 16, 1945

## 2016-03-02 ENCOUNTER — Telehealth: Payer: Self-pay | Admitting: Internal Medicine

## 2016-03-02 MED ORDER — DOXYCYCLINE HYCLATE 100 MG PO TABS: 100.0000 mg | ORAL_TABLET | Freq: Two times a day (BID) | ORAL | 0 refills | Status: DC

## 2016-03-02 MED ORDER — PREDNISONE 20 MG PO TABS: 40.0000 mg | ORAL_TABLET | Freq: Every day | ORAL | 0 refills | Status: DC

## 2016-03-02 MED ORDER — PREDNISONE 20 MG PO TABS: 40.0000 mg | ORAL_TABLET | Freq: Every day | ORAL | 0 refills | Status: AC

## 2016-03-02 NOTE — Telephone Encounter (Signed)
Canceled rx sent to [facility]. Rx sent to local pharmacy. Pt aware and voiced understanding. Nothing further needed.

## 2016-03-02 NOTE — Telephone Encounter (Signed)
Doxycycline 100 mg twice a day for 7 days Prednisone 40 mg (20mg  X 2) daily X 5 days  These orders have been sent to pharmacy.  Expect improvement beginning in 2-3 days  Call us if not improved after completion   Waunita Schooner

## 2016-03-02 NOTE — Telephone Encounter (Signed)
Pt c/o prod cough with yellow mucus & head congestion X1 wk pt denies any fever, chills or sweats. Pt has been taking Claritin D daily & duoneb bid with no relief. Pt has upcoming trip planned for christmas and is requesting recommendations.  DS please advise. thanks

## 2016-03-02 NOTE — Telephone Encounter (Signed)
Pt states she is going out of town for Christmas, and has started to get cold symptoms. States she is coughing up yellow stuff, very congested. Please call.

## 2016-03-04 DIAGNOSIS — I509 Heart failure, unspecified: Secondary | ICD-10-CM | POA: Diagnosis not present

## 2016-03-04 DIAGNOSIS — J449 Chronic obstructive pulmonary disease, unspecified: Secondary | ICD-10-CM | POA: Diagnosis not present

## 2016-03-04 DIAGNOSIS — R0689 Other abnormalities of breathing: Secondary | ICD-10-CM | POA: Diagnosis not present

## 2016-03-04 DIAGNOSIS — R0902 Hypoxemia: Secondary | ICD-10-CM | POA: Diagnosis not present

## 2016-03-18 DIAGNOSIS — J449 Chronic obstructive pulmonary disease, unspecified: Secondary | ICD-10-CM | POA: Diagnosis not present

## 2016-03-20 DIAGNOSIS — M48061 Spinal stenosis, lumbar region without neurogenic claudication: Secondary | ICD-10-CM | POA: Diagnosis not present

## 2016-03-20 DIAGNOSIS — G629 Polyneuropathy, unspecified: Secondary | ICD-10-CM | POA: Diagnosis not present

## 2016-03-20 DIAGNOSIS — R202 Paresthesia of skin: Secondary | ICD-10-CM | POA: Diagnosis not present

## 2016-03-20 DIAGNOSIS — M4802 Spinal stenosis, cervical region: Secondary | ICD-10-CM | POA: Diagnosis not present

## 2016-03-20 DIAGNOSIS — R600 Localized edema: Secondary | ICD-10-CM | POA: Diagnosis not present

## 2016-03-20 DIAGNOSIS — M2578 Osteophyte, vertebrae: Secondary | ICD-10-CM | POA: Diagnosis not present

## 2016-03-20 DIAGNOSIS — R531 Weakness: Secondary | ICD-10-CM | POA: Diagnosis not present

## 2016-03-20 DIAGNOSIS — M47817 Spondylosis without myelopathy or radiculopathy, lumbosacral region: Secondary | ICD-10-CM | POA: Diagnosis not present

## 2016-03-21 ENCOUNTER — Ambulatory Visit: Payer: Medicare HMO | Attending: Neurology | Admitting: Physical Therapy

## 2016-03-21 DIAGNOSIS — M6281 Muscle weakness (generalized): Secondary | ICD-10-CM | POA: Insufficient documentation

## 2016-03-21 DIAGNOSIS — Z9181 History of falling: Secondary | ICD-10-CM | POA: Diagnosis not present

## 2016-03-21 DIAGNOSIS — R2681 Unsteadiness on feet: Secondary | ICD-10-CM | POA: Diagnosis not present

## 2016-03-21 DIAGNOSIS — R2689 Other abnormalities of gait and mobility: Secondary | ICD-10-CM

## 2016-03-21 DIAGNOSIS — R269 Unspecified abnormalities of gait and mobility: Secondary | ICD-10-CM | POA: Diagnosis not present

## 2016-03-21 NOTE — Therapy (Signed)
[facility] [address] [place], [name], [address] Phone: [phone]   Fax:  [phone]  Physical Therapy Treatment  Patient Details  Name: Hannah Powell MRN: [id number] Date of Birth: 02-22-1946 Referring Provider: Jennings Books  Encounter Date: 03/21/2016      PT End of Session - 03/21/16 1349    Visit Number 2   Number of Visits 17   Date for PT Re-Evaluation 05-23-2016   Authorization Type g codes 1/10   Authorization Time Period --   Authorization - Visit Number 2   Authorization - Number of Visits 10   PT Start Time 0906   PT Stop Time 1001   PT Time Calculation (min) 55 min   Equipment Utilized During Treatment Gait belt   Activity Tolerance Patient tolerated treatment well   Behavior During Therapy [facility] for tasks assessed/performed      Past Medical History:  Diagnosis Date  . Anxiety   . Arthritis   . CHF (congestive heart failure) ([place])   . Hypertension    dr Otho Najjar     . Hypothyroidism   . RLS (restless legs syndrome)   . Shortness of breath   . Sleep apnea    cpap      >5 yrs    Past Surgical History:  Procedure Laterality Date  . BACK SURGERY     cervical  . FOOT ARTHROPLASTY    . JOINT REPLACEMENT     x2 tkr  . TOTAL SHOULDER ARTHROPLASTY Left 07/02/2013   Procedure: LEFT TOTAL SHOULDER ARTHROPLASTY;  Surgeon: Marin Shutter, MD;  Location: [facility];  Service: Orthopedics;  Laterality: Left;    There were no vitals filed for this visit.      Subjective Assessment - 03/21/16 1239    Subjective Pt. reports no pain prior to tx. session and entered PT with use of B axillary crutches.  Pt. c/o L foot issues and concerns with muscle weakness/ gait/ balance.     Pertinent History 71 yo female referred for PT evaluation for bilateral LE weakness. Pt reports that the weakness is gradually worsening. Pt has a history of balance deficits with 5 falls over the course of 2 weeks in 06/2015. Pt reports  only one falls since that time. Pt is complaining of L hip weakness at this time. She has a history of left knee patella fracture, bilateral knee replacements, and a L shoulder replacement. She ambulates with axillary crutches since 2013 after a L foot surgery. She has been actively working out with a trainer at [employer] but stopped approximately 1 month ago. Pt reports she went out of town, got sick, and then had financial constraints which prevented her from returning to her trainer. Pt a history of neuropathy in bilateral feet but reports it as mild. Pt denies other changes in her health recently. ROS negative for red flags.    How long can you sit comfortably? unlimited   How long can you stand comfortably? 3-4 hours   How long can you walk comfortably? 3-4 hours   Diagnostic tests last x-rays of left knee where in November 2016 which looked good;    Patient Stated Goals get rid of crutches and walk unassisted. "Increase my endurance." Pt reports primary limiting factor in walking endurance is use of crutches   Currently in Pain? No/denies      OBJECTIVE:  Neuro:  Standing tolerance in //-bars/ forward and lateral walking in //-bars with  cuing for upright posture/ BOS (2x).  Standing step touches with B UE progressing to single hand (difficulty with R UE during L hip flexion).  Tandem stance/ SLS with difficulty.  There.ex.: Supine SLR/ hip abd. With RTB/ add. With ball/ bridging with bolster under L foot 10x2.  Discussed HEP and plans for handouts next visit.  Nustep L5 10 min. (consistent cadence/ no breaks).       Pt response for medical necessity:  Pt. Benefits from generalized strengthening ex. Program to improve standing/ gait with least assistive device.  Limited by L ankle and B LE muscle weakness.  No pain.         PT Long Term Goals - 03/01/16 1504      PT LONG TERM GOAL #1   Title Patient will be independent in home exercise program to improve strength/mobility for  better functional independence with ADLs.   Time 8   Period Weeks   Status New     PT LONG TERM GOAL #2   Title Pt will decrease TUG by at least 5 seconds in order to demonstrate decreased fall risk    Baseline 03/01/16: 37.2 seconds   Time 8   Period Weeks   Status New     PT LONG TERM GOAL #3   Title Pt will increase 6MWT by at least 51m (179ft) in order to demonstrate clinically significant improvement in cardiopulmonary endurance and community ambulation   Baseline 03/01/16: 345'   Time 8   Period Weeks   Status New     PT LONG TERM GOAL #4   Title Pt will increase 10MWT speed by at least 0.13 m/s in order to demonstrate clinically significant improvement in community ambulation.   Baseline 03/01/16: 0.44 m/s   Time 8   Period Weeks   Status New     PT LONG TERM GOAL #5   Title --   Baseline --   Time --   Period --   Status --     PT LONG TERM GOAL #6   Title --   Baseline --   Time --   Period --   Status --     PT LONG TERM GOAL #7   Title --   Baseline --   Time --   Period --   Status --               Plan - 03/21/16 1350    Clinical Impression Statement Pt. requires significant B UE assist with all standing/walking tasks.  Pt. unable to lift/flexion L hip with step touches while using only R UE on //-bars.  Pt. benefits from use of B UE assist in //-bars and completed supine ex. with min. issues.  Pt. unable to PF L foot noted during supine bridging ex. and required bolster to support.  No pain limitations but poor generalized muscle strength/ conditioning t/o tx. session requiring breaks.     Rehab Potential Good   Clinical Impairments Affecting Rehab Potential positive: motivation, family support, negative: co-morbidities, deconditioned, high fall risk; Body structures that will need to be addressed: weakness, numbness/tingling, impaired balance, difficulty walking, decreased transfer ability, etc; Due to co-morbidities her clinical presenstation  is evolving;    PT Frequency 2x / week   PT Duration 8 weeks   PT Treatment/Interventions Aquatic Therapy;Cryotherapy;Electrical Stimulation;Moist Heat;Balance training;Therapeutic exercise;Therapeutic activities;Functional mobility training;Stair training;Gait training;DME Instruction;Neuromuscular re-education;Patient/family education;Energy conservation;ADLs/Self Care Home Management;Ultrasound;Manual techniques;Passive range of motion   PT Next Visit Plan Progress balance  and strengthening.  ISSUE HEP   PT Home Exercise Plan To be initiated at next session.    Consulted and Agree with Plan of Care Patient      Patient will benefit from skilled therapeutic intervention in order to improve the following deficits and impairments:  Abnormal gait, Decreased endurance, Impaired sensation, Obesity, Cardiopulmonary status limiting activity, Decreased activity tolerance, Decreased strength, Difficulty walking, Decreased mobility, Decreased balance, Impaired flexibility, Decreased safety awareness  Visit Diagnosis: Muscle weakness (generalized)  Other abnormalities of gait and mobility  Unsteadiness on feet  Abnormality of gait  Imbalance     Problem List Patient Active Problem List   Diagnosis Date Noted  . Acute on chronic diastolic CHF (congestive heart failure) ([place]) 07/21/2015  . COPD exacerbation ([place]) 07/21/2015  . Acute renal insufficiency 07/21/2015  . Hyperglycemia 07/21/2015  . Benign essential HTN 07/21/2015  . Acute on chronic respiratory failure with hypoxia and hypercapnia ([place]) 07/18/2015  . S/P shoulder replacement 07/02/2013   Pura Spice, PT, DPT # [id number] 03/21/2016, 1:55 PM  [facility] [address] [place], [name], [address] Phone: [phone]   Fax:  [phone]  Name: Aubrie Pompeo MRN: [id number] Date of Birth: 1945-10-11

## 2016-03-23 ENCOUNTER — Other Ambulatory Visit: Payer: Self-pay | Admitting: Internal Medicine

## 2016-03-23 DIAGNOSIS — M48061 Spinal stenosis, lumbar region without neurogenic claudication: Secondary | ICD-10-CM

## 2016-03-27 ENCOUNTER — Ambulatory Visit: Payer: Medicare HMO | Admitting: Physical Therapy

## 2016-03-27 DIAGNOSIS — M6281 Muscle weakness (generalized): Secondary | ICD-10-CM | POA: Diagnosis not present

## 2016-03-27 DIAGNOSIS — R2689 Other abnormalities of gait and mobility: Secondary | ICD-10-CM

## 2016-03-27 DIAGNOSIS — R269 Unspecified abnormalities of gait and mobility: Secondary | ICD-10-CM | POA: Diagnosis not present

## 2016-03-27 DIAGNOSIS — R2681 Unsteadiness on feet: Secondary | ICD-10-CM | POA: Diagnosis not present

## 2016-03-27 DIAGNOSIS — Z9181 History of falling: Secondary | ICD-10-CM | POA: Diagnosis not present

## 2016-03-27 NOTE — Therapy (Signed)
[facility] [address] [place], [name], [address] Phone: [phone]   Fax:  [phone]  Physical Therapy Treatment  Patient Details  Name: Hero Prem MRN: [id number] Date of Birth: 20-Jul-1945 Referring Provider: Jennings Books  Encounter Date: 03/27/2016      PT End of Session - 03/27/16 1805    Visit Number 3   Number of Visits 17   Date for PT Re-Evaluation 04/26/16   Authorization - Visit Number 3   Authorization - Number of Visits 10   PT Start Time [id number]   PT Stop Time 1216   PT Time Calculation (min) 51 min   Equipment Utilized During Treatment Gait belt   Activity Tolerance Patient tolerated treatment well   Behavior During Therapy [facility] for tasks assessed/performed      Past Medical History:  Diagnosis Date  . Anxiety   . Arthritis   . CHF (congestive heart failure) ([place])   . Hypertension    dr Otho Najjar     . Hypothyroidism   . RLS (restless legs syndrome)   . Shortness of breath   . Sleep apnea    cpap      >5 yrs    Past Surgical History:  Procedure Laterality Date  . BACK SURGERY     cervical  . FOOT ARTHROPLASTY    . JOINT REPLACEMENT     x2 tkr  . TOTAL SHOULDER ARTHROPLASTY Left 07/02/2013   Procedure: LEFT TOTAL SHOULDER ARTHROPLASTY;  Surgeon: Marin Shutter, MD;  Location: [place];  Service: Orthopedics;  Laterality: Left;    There were no vitals filed for this visit.      Subjective Assessment - 03/27/16 1800    Subjective Pt. reports no pain prior to tx. session and entered PT with use of B axillary crutches.  Pt reported she went to the gym 3x in the past week and performed cardiovascular exercises. Pt showed a positive disposition and is eager to progress from B axillary crutches to a single-point cane.    Pertinent History 71 yo female referred for PT evaluation for bilateral LE weakness. Pt reports that the weakness is gradually worsening. Pt has a history of balance deficits with 5  falls over the course of 2 weeks in 06/2015. Pt reports only one falls since that time. Pt is complaining of L hip weakness at this time. She has a history of left knee patella fracture, bilateral knee replacements, and a L shoulder replacement. She ambulates with axillary crutches since 2013 after a L foot surgery. She has been actively working out with a trainer at [employer] but stopped approximately 1 month ago. Pt reports she went out of town, got sick, and then had financial constraints which prevented her from returning to her trainer. Pt a history of neuropathy in bilateral feet but reports it as mild. Pt denies other changes in her health recently. ROS negative for red flags.    Limitations Walking;Standing;House hold activities   Patient Stated Goals get rid of crutches and walk unassisted. "Increase my endurance." Pt reports primary limiting factor in walking endurance is use of crutches   Currently in Pain? No/denies   Pain Score 0-No pain      There ex: Discussed gym based ex. //-bar forward/backward/sideways steps with focus on hip and knee flexion - cueing to decrease UE assist/ correct posture  Sit to stands 10x.   Neuro: Toe and heel touches w/ 6" step x20 each  leg - progressed from bilateral hand hold to to single hand hold  Ambulation w/ single-point cane - min/mod assist with cueing for safety Working on consistent BOS/ hip symmetry with standing posture and hip flexion.  Trying to prevent trendelenburg with increase glut stability.   Amb. Out to car with use of crutches (poor posture/ head position noted).          PT Long Term Goals - 03/01/16 1504      PT LONG TERM GOAL #1   Title Patient will be independent in home exercise program to improve strength/mobility for better functional independence with ADLs.   Time 8   Period Weeks   Status New     PT LONG TERM GOAL #2   Title Pt will decrease TUG by at least 5 seconds in order to demonstrate decreased  fall risk    Baseline 03/01/16: 37.2 seconds   Time 8   Period Weeks   Status New     PT LONG TERM GOAL #3   Title Pt will increase 6MWT by at least 64m (135ft) in order to demonstrate clinically significant improvement in cardiopulmonary endurance and community ambulation   Baseline 03/01/16: 345'   Time 8   Period Weeks   Status New     PT LONG TERM GOAL #4   Title Pt will increase 10MWT speed by at least 0.13 m/s in order to demonstrate clinically significant improvement in community ambulation.   Baseline 03/01/16: 0.44 m/s   Time 8   Period Weeks   Status New     PT LONG TERM GOAL #5   Title --   Baseline --   Time --   Period --   Status --     PT LONG TERM GOAL #6   Title --   Baseline --   Time --   Period --   Status --     PT LONG TERM GOAL #7   Title --   Baseline --   Time --   Period --   Status --            Plan - 03/27/16 1806    Clinical Impression Statement Pt. showed significant B LE fatigue during step touches in //-bars but demonstrated improved ability to lift L hip while using R UE in //-bars. Pt showed R sided glut. weakness and a Trendelenburg gait pattern.  with all aspects of walking.  Pt. benefits from use of L UE assist during R and L hip flexion step-ups.  Pt able to progress to 1-handed step-ups and was limited by fatigue but no pain.  Pt. remains highly motivated and hard working during tx. session.     Rehab Potential Good   Clinical Impairments Affecting Rehab Potential positive: motivation, family support, negative: co-morbidities, deconditioned, high fall risk; Body structures that will need to be addressed: weakness, numbness/tingling, impaired balance, difficulty walking, decreased transfer ability, etc; Due to co-morbidities her clinical presenstation is evolving;    PT Frequency 2x / week   PT Duration 8 weeks   PT Treatment/Interventions Aquatic Therapy;Cryotherapy;Electrical Stimulation;Moist Heat;Balance  training;Therapeutic exercise;Therapeutic activities;Functional mobility training;Stair training;Gait training;DME Instruction;Neuromuscular re-education;Patient/family education;Energy conservation;ADLs/Self Care Home Management;Ultrasound;Manual techniques;Passive range of motion   PT Next Visit Plan Progress balance and strengthening. discuss gym program and progression from B axillary crutches.  Discuss trip to [employer] and walking.        Patient will benefit from skilled therapeutic intervention in order to improve the following deficits  and impairments:  Abnormal gait, Decreased endurance, Impaired sensation, Obesity, Cardiopulmonary status limiting activity, Decreased activity tolerance, Decreased strength, Difficulty walking, Decreased mobility, Decreased balance, Impaired flexibility, Decreased safety awareness  Visit Diagnosis: Muscle weakness (generalized)  Other abnormalities of gait and mobility  Unsteadiness on feet  Imbalance  At risk for falls     Problem List Patient Active Problem List   Diagnosis Date Noted  . Acute on chronic diastolic CHF (congestive heart failure) ([place]) 07/21/2015  . COPD exacerbation ([place]) 07/21/2015  . Acute renal insufficiency 07/21/2015  . Hyperglycemia 07/21/2015  . Benign essential HTN 07/21/2015  . Acute on chronic respiratory failure with hypoxia and hypercapnia ([place]) 07/18/2015  . S/P shoulder replacement 07/02/2013    Pura Spice, PT, DPT # [id number] Willodean Rosenthal, SPT 03/27/2016, 6:35 PM  [place] [facility] [address] [place], [name], [address] Phone: [phone]   Fax:  [phone]  Name: Kristabella Turpen MRN: [id number] Date of Birth: [birth year]

## 2016-03-28 ENCOUNTER — Encounter: Payer: Commercial Managed Care - HMO | Admitting: Physical Therapy

## 2016-03-30 ENCOUNTER — Ambulatory Visit
Admission: RE | Admit: 2016-03-30 | Discharge: 2016-03-30 | Disposition: A | Discharge status: Home / Self Care | Payer: Medicare HMO | Source: Ambulatory Visit | Attending: Internal Medicine | Admitting: Internal Medicine

## 2016-03-30 DIAGNOSIS — M47816 Spondylosis without myelopathy or radiculopathy, lumbar region: Secondary | ICD-10-CM | POA: Diagnosis not present

## 2016-03-30 DIAGNOSIS — M48061 Spinal stenosis, lumbar region without neurogenic claudication: Secondary | ICD-10-CM | POA: Diagnosis not present

## 2016-03-30 DIAGNOSIS — M5126 Other intervertebral disc displacement, lumbar region: Secondary | ICD-10-CM | POA: Insufficient documentation

## 2016-03-30 DIAGNOSIS — M545 Low back pain: Secondary | ICD-10-CM | POA: Diagnosis not present

## 2016-04-04 ENCOUNTER — Encounter: Payer: Commercial Managed Care - HMO | Admitting: Physical Therapy

## 2016-04-04 DIAGNOSIS — I509 Heart failure, unspecified: Secondary | ICD-10-CM | POA: Diagnosis not present

## 2016-04-04 DIAGNOSIS — J449 Chronic obstructive pulmonary disease, unspecified: Secondary | ICD-10-CM | POA: Diagnosis not present

## 2016-04-04 DIAGNOSIS — R0689 Other abnormalities of breathing: Secondary | ICD-10-CM | POA: Diagnosis not present

## 2016-04-04 DIAGNOSIS — R0902 Hypoxemia: Secondary | ICD-10-CM | POA: Diagnosis not present

## 2016-04-06 ENCOUNTER — Ambulatory Visit: Payer: Medicare HMO | Admitting: Physical Therapy

## 2016-04-11 ENCOUNTER — Ambulatory Visit: Payer: Medicare HMO | Admitting: Physical Therapy

## 2016-04-11 DIAGNOSIS — R269 Unspecified abnormalities of gait and mobility: Secondary | ICD-10-CM | POA: Diagnosis not present

## 2016-04-11 DIAGNOSIS — M1710 Unilateral primary osteoarthritis, unspecified knee: Secondary | ICD-10-CM | POA: Diagnosis not present

## 2016-04-11 DIAGNOSIS — M4696 Unspecified inflammatory spondylopathy, lumbar region: Secondary | ICD-10-CM | POA: Diagnosis not present

## 2016-04-11 DIAGNOSIS — M79604 Pain in right leg: Secondary | ICD-10-CM | POA: Diagnosis not present

## 2016-04-11 DIAGNOSIS — M6281 Muscle weakness (generalized): Secondary | ICD-10-CM | POA: Diagnosis not present

## 2016-04-11 DIAGNOSIS — R2689 Other abnormalities of gait and mobility: Secondary | ICD-10-CM

## 2016-04-11 DIAGNOSIS — Z9181 History of falling: Secondary | ICD-10-CM | POA: Diagnosis not present

## 2016-04-11 DIAGNOSIS — R2681 Unsteadiness on feet: Secondary | ICD-10-CM | POA: Diagnosis not present

## 2016-04-11 DIAGNOSIS — J438 Other emphysema: Secondary | ICD-10-CM | POA: Diagnosis not present

## 2016-04-11 DIAGNOSIS — I5032 Chronic diastolic (congestive) heart failure: Secondary | ICD-10-CM | POA: Diagnosis not present

## 2016-04-11 DIAGNOSIS — M159 Polyosteoarthritis, unspecified: Secondary | ICD-10-CM | POA: Diagnosis not present

## 2016-04-11 DIAGNOSIS — I10 Essential (primary) hypertension: Secondary | ICD-10-CM | POA: Diagnosis not present

## 2016-04-11 DIAGNOSIS — J9611 Chronic respiratory failure with hypoxia: Secondary | ICD-10-CM | POA: Diagnosis not present

## 2016-04-11 DIAGNOSIS — G4733 Obstructive sleep apnea (adult) (pediatric): Secondary | ICD-10-CM | POA: Diagnosis not present

## 2016-04-11 DIAGNOSIS — Z6841 Body Mass Index (BMI) 40.0 and over, adult: Secondary | ICD-10-CM | POA: Diagnosis not present

## 2016-04-11 DIAGNOSIS — G629 Polyneuropathy, unspecified: Secondary | ICD-10-CM | POA: Diagnosis not present

## 2016-04-11 NOTE — Therapy (Signed)
[facility] [address] [place], [name], [address] Phone: [phone]   Fax:  [phone]  Physical Therapy Treatment  Patient Details  Name: Hannah Powell MRN: [id number] Date of Birth: 1945-05-18 Referring Provider: Jennings Books  Encounter Date: 04/11/2016      PT End of Session - 04/11/16 1007    Visit Number 4   Number of Visits 12   Date for PT Re-Evaluation 05/09/16   Authorization - Visit Number 5   Authorization - Number of Visits 14   PT Start Time 0902   PT Stop Time 1005   PT Time Calculation (min) 63 min   Equipment Utilized During Treatment Gait belt   Activity Tolerance Patient tolerated treatment well;Patient limited by fatigue   Behavior During Therapy [facility] for tasks assessed/performed      Past Medical History:  Diagnosis Date  . Anxiety   . Arthritis   . CHF (congestive heart failure) ([place])   . Hypertension    dr Otho Najjar     . Hypothyroidism   . RLS (restless legs syndrome)   . Shortness of breath   . Sleep apnea    cpap      >5 yrs    Past Surgical History:  Procedure Laterality Date  . BACK SURGERY     cervical  . FOOT ARTHROPLASTY    . JOINT REPLACEMENT     x2 tkr  . TOTAL SHOULDER ARTHROPLASTY Left 07/02/2013   Procedure: LEFT TOTAL SHOULDER ARTHROPLASTY;  Surgeon: Marin Shutter, MD;  Location: [place];  Service: Orthopedics;  Laterality: Left;    There were no vitals filed for this visit.      Subjective Assessment - 04/11/16 0932    Subjective Pt. reports no pain prior to tx. session and entered PT with use of B axillary crutches. Pt. said she went to the gym one time this week and swam and rode the bike.    Pertinent History 71 yo female referred for PT evaluation for bilateral LE weakness. Pt reports that the weakness is gradually worsening. Pt has a history of balance deficits with 5 falls over the course of 2 weeks in 06/2015. Pt reports only one falls since that time. Pt is  complaining of L hip weakness at this time. She has a history of left knee patella fracture, bilateral knee replacements, and a L shoulder replacement. She ambulates with axillary crutches since 2013 after a L foot surgery. She has been actively working out with a trainer at [employer] but stopped approximately 1 month ago. Pt reports she went out of town, got sick, and then had financial constraints which prevented her from returning to her trainer. Pt a history of neuropathy in bilateral feet but reports it as mild. Pt denies other changes in her health recently. ROS negative for red flags.    Limitations Walking;Standing;House hold activities   Patient Stated Goals get rid of crutches and walk unassisted. "Increase my endurance." Pt reports primary limiting factor in walking endurance is use of crutches   Currently in Pain? No/denies      Therex: Standing marches in //-bars with focus on LLE.  Standing hip abduction w/ RTB - sing UE assist Step-ups on 6" step - cuing for LLE to flex straight up/no circumduction (B UE assist) some fatigue noted. Nustep L3 B UE/LE 10 min (no charge) consistent cadence 60 steps/min  Neuro: Tandem balance in //-bars - good LE control, some cuing  for posture req.  Single leg balance - single UE assist.  SLS with forward reach  Forward punches w/ 2# dumbbell on airex - no assist. Good UE control  Gait training: Walking w/ B axillary crutches progressed to QC in //-bars for first time. Pt. Fearful to leave //-bars but practiced small lap with SPT CGA and gait belt.  Pt will benefit from continued LE strength and balance training as well as progression of gait training from B axillary crutches to QC for more independent ambulation in community and at home.         PT Long Term Goals - 04/12/16 1221      PT LONG TERM GOAL #1   Title Patient will be independent in home exercise program to improve strength/mobility for better functional  independence with ADLs.   Time 4   Period Weeks   Status On-going     PT LONG TERM GOAL #2   Title Pt will decrease TUG by at least 5 seconds in order to demonstrate decreased fall risk    Baseline 03/01/16: 37.2 seconds   Time 4   Period Weeks   Status On-going     PT LONG TERM GOAL #3   Title Pt will increase 6MWT by at least 30m (139ft) in order to demonstrate clinically significant improvement in cardiopulmonary endurance and community ambulation   Baseline 03/01/16: 345'   Time 4   Period Weeks   Status On-going     PT LONG TERM GOAL #4   Title Pt will increase 10MWT speed by at least 0.13 m/s in order to demonstrate clinically significant improvement in community ambulation.   Baseline 03/01/16: 0.44 m/s   Time 4   Period Weeks   Status On-going             Plan - 04/11/16 1008    Clinical Impression Statement Pt. demonstrated improved R glut. weakness during step ups in //-bars with single UE assist. Pt. able to complete 10x2 step-ups before fatiguing. Pt. completed 20 forward shoulder flexion w/ 2# dumbbells on airex w/ no assist. Pt. still using B axillary crutches for ambulation, progressed to QC in //-bars today. Pt. ambulated out of //-bars at end of tx session with CGA and gait belt. Pt. has been going to the gym to swim and bike and will benefit from continued B LE strengthening, balance training, and gait training progression.    Rehab Potential Good   Clinical Impairments Affecting Rehab Potential positive: motivation, family support, negative: co-morbidities, deconditioned, high fall risk; Body structures that will need to be addressed: weakness, numbness/tingling, impaired balance, difficulty walking, decreased transfer ability, etc; Due to co-morbidities her clinical presenstation is evolving;    PT Frequency 2x / week   PT Duration 8 weeks   PT Treatment/Interventions Aquatic Therapy;Cryotherapy;Electrical Stimulation;Moist Heat;Balance training;Therapeutic  exercise;Therapeutic activities;Functional mobility training;Stair training;Gait training;DME Instruction;Neuromuscular re-education;Patient/family education;Energy conservation;ADLs/Self Care Home Management;Ultrasound;Manual techniques;Passive range of motion   PT Next Visit Plan Progress balance and LE strength program. Assess home program. Progress QC walking.   Consulted and Agree with Plan of Care Patient      Patient will benefit from skilled therapeutic intervention in order to improve the following deficits and impairments:  Abnormal gait, Decreased endurance, Impaired sensation, Obesity, Cardiopulmonary status limiting activity, Decreased activity tolerance, Decreased strength, Difficulty walking, Decreased mobility, Decreased balance, Impaired flexibility, Decreased safety awareness  Visit Diagnosis: Muscle weakness (generalized)  Unsteadiness on feet  Imbalance       G-Codes -  04/11/16 1422    Functional Assessment Tool Used 5TSTS, 58m walk time, TUG, 6 minute walk test, clinical judgement   Functional Limitation Mobility: Walking and moving around   Mobility: Walking and Moving Around Current Status [phone]) At least 40 percent but less than 60 percent impaired, limited or restricted   Mobility: Walking and Moving Around Goal Status [phone]) At least 20 percent but less than 40 percent impaired, limited or restricted      Problem List Patient Active Problem List   Diagnosis Date Noted  . Acute on chronic diastolic CHF (congestive heart failure) ([place]) 07/21/2015  . COPD exacerbation ([place]) 07/21/2015  . Acute renal insufficiency 07/21/2015  . Hyperglycemia 07/21/2015  . Benign essential HTN 07/21/2015  . Acute on chronic respiratory failure with hypoxia and hypercapnia ([place]) 07/18/2015  . S/P shoulder replacement 07/02/2013   Pura Spice, PT, DPT # [id number], SPT 04/12/2016, 12:23 PM  [facility] [address] [place], [name], [address] Phone: [phone]   Fax:  [phone]  Name: Hannah Powell MRN: [id number] Date of Birth: 14-Sep-1945

## 2016-04-12 ENCOUNTER — Telehealth: Payer: Self-pay | Admitting: Internal Medicine

## 2016-04-12 NOTE — Telephone Encounter (Signed)
Pt states she has gotten congestion, "all of the sudden". States she is using her Albuterol and Claritin D and Flonase. States at night she coughs continually. States she has taken Tussinex before for her cough. Please call.

## 2016-04-13 MED ORDER — AZITHROMYCIN 250 MG PO TABS: ORAL_TABLET | ORAL | 0 refills | Status: AC

## 2016-04-13 NOTE — Telephone Encounter (Signed)
Zpak ordered. Have her call back first of next week if not improving si that she may be seen by any available provider  Thanks  Waunita Schooner

## 2016-04-13 NOTE — Telephone Encounter (Signed)
Pt informed. Nothing further needed. 

## 2016-04-13 NOTE — Telephone Encounter (Signed)
Prod cough at times. Denies fever. Pt states she has gotten congestion, "all of the sudden". States she is using her Albuterol and Claritin D and Flonase. States at night she coughs continually. States she has taken Tussinex before for her cough. Please advise.

## 2016-04-17 DIAGNOSIS — M5441 Lumbago with sciatica, right side: Secondary | ICD-10-CM | POA: Diagnosis not present

## 2016-04-17 DIAGNOSIS — M7061 Trochanteric bursitis, right hip: Secondary | ICD-10-CM | POA: Diagnosis not present

## 2016-04-18 ENCOUNTER — Ambulatory Visit: Payer: Medicare HMO | Admitting: Physical Therapy

## 2016-04-18 ENCOUNTER — Other Ambulatory Visit: Payer: Self-pay | Admitting: Internal Medicine

## 2016-04-18 DIAGNOSIS — J438 Other emphysema: Secondary | ICD-10-CM | POA: Diagnosis not present

## 2016-04-18 DIAGNOSIS — Z1231 Encounter for screening mammogram for malignant neoplasm of breast: Secondary | ICD-10-CM

## 2016-04-18 DIAGNOSIS — M1710 Unilateral primary osteoarthritis, unspecified knee: Secondary | ICD-10-CM | POA: Diagnosis not present

## 2016-04-18 DIAGNOSIS — M4696 Unspecified inflammatory spondylopathy, lumbar region: Secondary | ICD-10-CM | POA: Diagnosis not present

## 2016-04-18 DIAGNOSIS — R2681 Unsteadiness on feet: Secondary | ICD-10-CM | POA: Diagnosis not present

## 2016-04-18 DIAGNOSIS — R2689 Other abnormalities of gait and mobility: Secondary | ICD-10-CM | POA: Diagnosis not present

## 2016-04-18 DIAGNOSIS — R269 Unspecified abnormalities of gait and mobility: Secondary | ICD-10-CM | POA: Diagnosis not present

## 2016-04-18 DIAGNOSIS — M6281 Muscle weakness (generalized): Secondary | ICD-10-CM | POA: Diagnosis not present

## 2016-04-18 DIAGNOSIS — J449 Chronic obstructive pulmonary disease, unspecified: Secondary | ICD-10-CM | POA: Diagnosis not present

## 2016-04-18 DIAGNOSIS — I10 Essential (primary) hypertension: Secondary | ICD-10-CM | POA: Diagnosis not present

## 2016-04-18 DIAGNOSIS — Z0000 Encounter for general adult medical examination without abnormal findings: Secondary | ICD-10-CM | POA: Diagnosis not present

## 2016-04-18 DIAGNOSIS — E034 Atrophy of thyroid (acquired): Secondary | ICD-10-CM | POA: Diagnosis not present

## 2016-04-18 DIAGNOSIS — M159 Polyosteoarthritis, unspecified: Secondary | ICD-10-CM | POA: Diagnosis not present

## 2016-04-18 DIAGNOSIS — Z9181 History of falling: Secondary | ICD-10-CM | POA: Diagnosis not present

## 2016-04-18 DIAGNOSIS — G63 Polyneuropathy in diseases classified elsewhere: Secondary | ICD-10-CM | POA: Diagnosis not present

## 2016-04-18 DIAGNOSIS — I5032 Chronic diastolic (congestive) heart failure: Secondary | ICD-10-CM | POA: Diagnosis not present

## 2016-04-18 DIAGNOSIS — J9611 Chronic respiratory failure with hypoxia: Secondary | ICD-10-CM | POA: Diagnosis not present

## 2016-04-18 NOTE — Therapy (Signed)
[place] [facility] [address] [place], [name], [address] Phone: [phone]   Fax:  [phone]  Physical Therapy Treatment  Patient Details  Name: Hannah Powell MRN: [id number] Date of Birth: 03/14/1946 Referring Provider: Jennings Books  Encounter Date: 04/18/2016      PT End of Session - 04/18/16 1507    Visit Number 5   Number of Visits 12   Date for PT Re-Evaluation 05/09/16   Authorization - Visit Number 5   Authorization - Number of Visits 14   PT Start Time 1302   PT Stop Time 1401   PT Time Calculation (min) 59 min   Equipment Utilized During Treatment Gait belt   Activity Tolerance Patient tolerated treatment well;Patient limited by fatigue;Patient limited by pain   Behavior During Therapy [facility] for tasks assessed/performed      Past Medical History:  Diagnosis Date  . Anxiety   . Arthritis   . CHF (congestive heart failure) ([place])   . Hypertension    dr Otho Najjar     . Hypothyroidism   . RLS (restless legs syndrome)   . Shortness of breath   . Sleep apnea    cpap      >5 yrs    Past Surgical History:  Procedure Laterality Date  . BACK SURGERY     cervical  . FOOT ARTHROPLASTY    . JOINT REPLACEMENT     x2 tkr  . TOTAL SHOULDER ARTHROPLASTY Left 07/02/2013   Procedure: LEFT TOTAL SHOULDER ARTHROPLASTY;  Surgeon: Marin Shutter, MD;  Location: [facility];  Service: Orthopedics;  Laterality: Left;    There were no vitals filed for this visit.      Subjective Assessment - 04/18/16 1259    Subjective Pt. reports R sided bursitis dx in past week and received injection yesterday and reports 4/10 pain.    Pertinent History 71 yo female referred for PT evaluation for bilateral LE weakness. Pt reports that the weakness is gradually worsening. Pt has a history of balance deficits with 5 falls over the course of 2 weeks in 06/2015. Pt reports only one falls since that time. Pt is complaining of L hip weakness at this  time. She has a history of left knee patella fracture, bilateral knee replacements, and a L shoulder replacement. She ambulates with axillary crutches since 2013 after a L foot surgery. She has been actively working out with a trainer at [employer] but stopped approximately 1 month ago. Pt reports she went out of town, got sick, and then had financial constraints which prevented her from returning to her trainer. Pt a history of neuropathy in bilateral feet but reports it as mild. Pt denies other changes in her health recently. ROS negative for red flags.    Limitations Walking;Standing;House hold activities   How long can you sit comfortably? unlimited   How long can you stand comfortably? 3-4 hours   How long can you walk comfortably? 3-4 hours   Diagnostic tests last x-rays of left knee where in November 2016 which looked good;    Patient Stated Goals get rid of crutches and walk unassisted. "Increase my endurance." Pt reports primary limiting factor in walking endurance is use of crutches   Currently in Pain? Yes   Pain Score 4    Pain Location Hip   Pain Orientation Right     Gait training:  Ambulation //-bars forward/backward double UE assist to single to 2 finger touch  Cane ambulation in //-bars (pt less fearful here) Cane ambulation in clinic - CGA ~40 ft. Pt. Confidence improving with increased practice. Reciprocal gait pattern noted w/ slight R Trendelenberg   Therex: Lateral walks w/ squat - fatigue noted after 2 trials. R hip pain (recent bursitis dx) Step-ups 6" - B UE assist on rails - pt. Able to complete incr. Reps before fatigue from last tx.  Step over bars leading with left leg BUE support, single UE support, two finger bilateral support. Verbal cues to maintain posture.  Nustep 10 min L6 consistent cadence (no charge)   Pt will benefit from continued LE strength and balance training as well as progression of gait training from B axillary crutches to QC for  more independent ambulation in community and at home.          PT Long Term Goals - 04/12/16 1221      PT LONG TERM GOAL #1   Title Patient will be independent in home exercise program to improve strength/mobility for better functional independence with ADLs.   Time 4   Period Weeks   Status On-going     PT LONG TERM GOAL #2   Title Pt will decrease TUG by at least 5 seconds in order to demonstrate decreased fall risk    Baseline 03/01/16: 37.2 seconds   Time 4   Period Weeks   Status On-going     PT LONG TERM GOAL #3   Title Pt will increase 6MWT by at least 89m (127ft) in order to demonstrate clinically significant improvement in cardiopulmonary endurance and community ambulation   Baseline 03/01/16: 345'   Time 4   Period Weeks   Status On-going     PT LONG TERM GOAL #4   Title Pt will increase 10MWT speed by at least 0.13 m/s in order to demonstrate clinically significant improvement in community ambulation.   Baseline 03/01/16: 0.44 m/s   Time 4   Period Weeks   Status On-going            Plan - 04/18/16 1508    Clinical Impression Statement Pt. progressed today with SPC ambulation outside parallel bars w/ CGA. Pt. fearful to practice ambulation with cane but demonstrates improving sequencing with reciprocal gait pattern. Some verbal cuing req. for correct gait pattern w/ cane. Pt. demonstrates kyphotic posture/forward head and frequently looks at feet during ambulation but is able to correct with verbal cuing. Pt. fatigues with standing parallel bar exercises but is staying consistent with making it to the gym while at home to perform cardio and LE strength exercises. B hip flexion strength increasing with 6" step up exercises. Pt. will benefit from continued gait training to increase confidence and LE strength/balance to progress from B axillary crutches to [facility] for use at home and in the community.    Rehab Potential Good   Clinical Impairments Affecting Rehab  Potential positive: motivation, family support, negative: co-morbidities, deconditioned, high fall risk; Body structures that will need to be addressed: weakness, numbness/tingling, impaired balance, difficulty walking, decreased transfer ability, etc; Due to co-morbidities her clinical presenstation is evolving;    PT Frequency 2x / week   PT Duration 4 weeks   PT Treatment/Interventions Aquatic Therapy;Cryotherapy;Electrical Stimulation;Moist Heat;Balance training;Therapeutic exercise;Therapeutic activities;Functional mobility training;Stair training;Gait training;DME Instruction;Neuromuscular re-education;Patient/family education;Energy conservation;ADLs/Self Care Home Management;Ultrasound;Manual techniques;Passive range of motion   PT Next Visit Plan Progress balance and LE strength program. Assess home program. Progress QC walking.   Consulted and Agree with Plan of  Care Patient      Patient will benefit from skilled therapeutic intervention in order to improve the following deficits and impairments:  Abnormal gait, Decreased endurance, Impaired sensation, Obesity, Cardiopulmonary status limiting activity, Decreased activity tolerance, Decreased strength, Difficulty walking, Decreased mobility, Decreased balance, Impaired flexibility, Decreased safety awareness  Visit Diagnosis: Muscle weakness (generalized)  Unsteadiness on feet  Imbalance  Other abnormalities of gait and mobility  At risk for falls     Problem List Patient Active Problem List   Diagnosis Date Noted  . Acute on chronic diastolic CHF (congestive heart failure) ([place]) 07/21/2015  . COPD exacerbation ([place]) 07/21/2015  . Acute renal insufficiency 07/21/2015  . Hyperglycemia 07/21/2015  . Benign essential HTN 07/21/2015  . Acute on chronic respiratory failure with hypoxia and hypercapnia ([place]) 07/18/2015  . S/P shoulder replacement 07/02/2013   Pura Spice, PT, DPT # [id number] SPT 04/18/2016, 6:26  PM  [place] [facility] [address] [place], [name], [address] Phone: [phone]   Fax:  [phone]  Name: Hannah Powell MRN: [id number] Date of Birth: 03/15/1946

## 2016-04-23 ENCOUNTER — Ambulatory Visit
Admission: RE | Admit: 2016-04-23 | Discharge: 2016-04-23 | Disposition: A | Discharge status: Home / Self Care | Payer: Medicare HMO | Source: Ambulatory Visit | Attending: Internal Medicine | Admitting: Internal Medicine

## 2016-04-23 DIAGNOSIS — Z1231 Encounter for screening mammogram for malignant neoplasm of breast: Secondary | ICD-10-CM | POA: Insufficient documentation

## 2016-04-24 DIAGNOSIS — Z780 Asymptomatic menopausal state: Secondary | ICD-10-CM | POA: Diagnosis not present

## 2016-04-24 DIAGNOSIS — M8588 Other specified disorders of bone density and structure, other site: Secondary | ICD-10-CM | POA: Diagnosis not present

## 2016-04-25 ENCOUNTER — Ambulatory Visit: Payer: Medicare HMO | Attending: Neurology | Admitting: Physical Therapy

## 2016-04-25 DIAGNOSIS — R2689 Other abnormalities of gait and mobility: Secondary | ICD-10-CM | POA: Diagnosis not present

## 2016-04-25 DIAGNOSIS — Z9181 History of falling: Secondary | ICD-10-CM | POA: Insufficient documentation

## 2016-04-25 DIAGNOSIS — M6281 Muscle weakness (generalized): Secondary | ICD-10-CM | POA: Diagnosis not present

## 2016-04-25 DIAGNOSIS — R2681 Unsteadiness on feet: Secondary | ICD-10-CM

## 2016-04-25 NOTE — Therapy (Signed)
[place] [facility] [address]. [place], [name], [address] Phone: [phone]   Fax:  [phone]  Physical Therapy Treatment  Patient Details  Name: Hannah Powell MRN: [id number] Date of Birth: 25-Apr-1945 Referring Provider: Jennings Books  Encounter Date: 04/25/2016      PT End of Session - 04/25/16 1308    Visit Number 6   Number of Visits 12   Date for PT Re-Evaluation 05/09/16   Authorization - Visit Number 6   Authorization - Number of Visits 14   PT Start Time [id number]   PT Stop Time [id number]   PT Time Calculation (min) 56 min   Equipment Utilized During Treatment Gait belt   Activity Tolerance Patient tolerated treatment well;Patient limited by fatigue;Patient limited by pain   Behavior During Therapy [facility] for tasks assessed/performed      Past Medical History:  Diagnosis Date  . Anxiety   . Arthritis   . CHF (congestive heart failure) ([place])   . Hypertension    dr Otho Najjar     . Hypothyroidism   . RLS (restless legs syndrome)   . Shortness of breath   . Sleep apnea    cpap      >5 yrs    Past Surgical History:  Procedure Laterality Date  . BACK SURGERY     cervical  . BREAST BIOPSY Left    needle bx-neg  . FOOT ARTHROPLASTY    . JOINT REPLACEMENT     x2 tkr  . TOTAL SHOULDER ARTHROPLASTY Left 07/02/2013   Procedure: LEFT TOTAL SHOULDER ARTHROPLASTY;  Surgeon: Marin Shutter, MD;  Location: [facility];  Service: Orthopedics;  Laterality: Left;    There were no vitals filed for this visit.      Subjective Assessment - 04/25/16 1305    Subjective Patient says she has increased her weight for the abductor and adductor machine at the gym and is feeling good. She is feeling a lot better this week.    Pertinent History 71 yo female referred for PT evaluation for bilateral LE weakness. Pt reports that the weakness is gradually worsening. Pt has a history of balance deficits with 5 falls over the course of 2 weeks in 06/2015.  Pt reports only one falls since that time. Pt is complaining of L hip weakness at this time. She has a history of left knee patella fracture, bilateral knee replacements, and a L shoulder replacement. She ambulates with axillary crutches since 2013 after a L foot surgery. She has been actively working out with a trainer at [employer] but stopped approximately 1 month ago. Pt reports she went out of town, got sick, and then had financial constraints which prevented her from returning to her trainer. Pt a history of neuropathy in bilateral feet but reports it as mild. Pt denies other changes in her health recently. ROS negative for red flags.    Limitations Walking;Standing;House hold activities   How long can you sit comfortably? unlimited   How long can you stand comfortably? 3-4 hours   How long can you walk comfortably? 3-4 hours   Diagnostic tests last x-rays of left knee where in November 2016 which looked good;    Patient Stated Goals get rid of crutches and walk unassisted. "Increase my endurance." Pt reports primary limiting factor in walking endurance is use of crutches   Currently in Pain? No/denies      Therex: Walking in // bars with single HHA (R)  and CGA for multiple trials.  Step over 3" in // bars with light BHHA  for multiple trials. Verbal cues for not putting pressure through arms. Cues for upright posture.  Step ups with LLE/RLEonto 6" in // bars x10 each leg combined with forward and backwards walking in // bars ~3 ft. . Cues for upright posture Step up/over with LLE 6" in // bars for multiple trials.  Dynamic marches in // bars with BUE support , patient performed well with minimal cueing Partial squat side steps in // bars with BUE support. Pt requires cues for upright trunk. Educated on how to perform in pool   Gait Tng: Ambulating with SPT 2x ~40 ft. Cues for keeping core activated with upright posture to look 10-15 ft ahead.  Turn pivot transfer with SPT:  CGA Ambulating with Quad cane: in // bars with BUE support to educate on use of quad cane, ambulating with quad cane in // bars without using railing.  Ambulating 15 ft with quad cane and CGA in clinic.     Pt. Does 30 min cross country exercises on noodle in water and leg lifts, squats in water, and balance in water           PT Long Term Goals - 04/12/16 1221      PT LONG TERM GOAL #1   Title Patient will be independent in home exercise program to improve strength/mobility for better functional independence with ADLs.   Time 4   Period Weeks   Status On-going     PT LONG TERM GOAL #2   Title Pt will decrease TUG by at least 5 seconds in order to demonstrate decreased fall risk    Baseline 03/01/16: 37.2 seconds   Time 4   Period Weeks   Status On-going     PT LONG TERM GOAL #3   Title Pt will increase 6MWT by at least 71m (152ft) in order to demonstrate clinically significant improvement in cardiopulmonary endurance and community ambulation   Baseline 03/01/16: 345'   Time 4   Period Weeks   Status On-going     PT LONG TERM GOAL #4   Title Pt will increase 10MWT speed by at least 0.13 m/s in order to demonstrate clinically significant improvement in community ambulation.   Baseline 03/01/16: 0.44 m/s   Time 4   Period Weeks   Status On-going             Plan - 04/25/16 1355    Clinical Impression Statement Patient demonstrated good tolerance of activities today with occasional standing rest breaks. Patient requires continuous cueing for upright posture. Functional hip strength is improving with noted progression of step up/over, decreased need of BUE assistance, and improved control of LE body mechanics. Patient continues to rely on UE support when LE fatigue. The ability to ambulate with a cane for longer durations is improving with continued gait deviations of decreased step length and weight bearing on right side. Patient educated on use of quad cane and  performed multiple trials in parallel bars. Patient ambulated with similar gait patterns with SPC and quad cane and upon discussion with patient decided to continue patient ambulatory plan with [facility]. Patient would benefit from continued skilled physical therapy for gait training, balance, gross strengthening, and increased independence navigating natural environment.    Rehab Potential Good   Clinical Impairments Affecting Rehab Potential positive: motivation, family support, negative: co-morbidities, deconditioned, high fall risk; Body structures that will need to be addressed:  weakness, numbness/tingling, impaired balance, difficulty walking, decreased transfer ability, etc; Due to co-morbidities her clinical presenstation is evolving;    PT Frequency 1x / week   PT Duration 4 weeks   PT Treatment/Interventions Aquatic Therapy;Cryotherapy;Electrical Stimulation;Moist Heat;Balance training;Therapeutic exercise;Therapeutic activities;Functional mobility training;Stair training;Gait training;DME Instruction;Neuromuscular re-education;Patient/family education;Energy conservation;ADLs/Self Care Home Management;Ultrasound;Manual techniques;Passive range of motion   PT Next Visit Plan Progress balance and LE strength program. Assess home program. Progress QC walking.   Consulted and Agree with Plan of Care Patient      Patient will benefit from skilled therapeutic intervention in order to improve the following deficits and impairments:  Abnormal gait, Decreased endurance, Impaired sensation, Obesity, Cardiopulmonary status limiting activity, Decreased activity tolerance, Decreased strength, Difficulty walking, Decreased mobility, Decreased balance, Impaired flexibility, Decreased safety awareness  Visit Diagnosis: Muscle weakness (generalized)  Unsteadiness on feet  Imbalance  Other abnormalities of gait and mobility  At risk for falls     Problem List Patient Active Problem List   Diagnosis  Date Noted  . Acute on chronic diastolic CHF (congestive heart failure) ([place]) 07/21/2015  . COPD exacerbation ([place]) 07/21/2015  . Acute renal insufficiency 07/21/2015  . Hyperglycemia 07/21/2015  . Benign essential HTN 07/21/2015  . Acute on chronic respiratory failure with hypoxia and hypercapnia ([place]) 07/18/2015  . S/P shoulder replacement 07/02/2013   Pura Spice, PT, DPT # [id number] Willodean Rosenthal, SPT 04/26/2016, 7:51 AM  [place] [facility] [address] [place], [name], [address] Phone: [phone]   Fax:  [phone]  Name: Zariah Gluckman MRN: [id number] Date of Birth: 02-Aug-1945

## 2016-05-02 ENCOUNTER — Ambulatory Visit: Payer: Medicare HMO | Admitting: Physical Therapy

## 2016-05-02 DIAGNOSIS — R2689 Other abnormalities of gait and mobility: Secondary | ICD-10-CM | POA: Diagnosis not present

## 2016-05-02 DIAGNOSIS — M6281 Muscle weakness (generalized): Secondary | ICD-10-CM

## 2016-05-02 DIAGNOSIS — R2681 Unsteadiness on feet: Secondary | ICD-10-CM

## 2016-05-02 DIAGNOSIS — Z9181 History of falling: Secondary | ICD-10-CM

## 2016-05-02 NOTE — Therapy (Signed)
[place] [facility] [address] [place], [name], [address] Phone: [phone]   Fax:  [phone]  Physical Therapy Treatment  Patient Details  Name: Hannah Powell MRN: [id number] Date of Birth: [birth year] Referring Provider: Jennings Books  Encounter Date: 05/02/2016      PT End of Session - 05/02/16 1101    Visit Number 7   Number of Visits 12   Date for PT Re-Evaluation 05/09/16   Authorization - Visit Number 7   Authorization - Number of Visits 14   PT Start Time 0949   PT Stop Time 1051   PT Time Calculation (min) 62 min   Equipment Utilized During Treatment Gait belt   Activity Tolerance Patient tolerated treatment well   Behavior During Therapy [facility] for tasks assessed/performed      Past Medical History:  Diagnosis Date  . Anxiety   . Arthritis   . CHF (congestive heart failure) ([place])   . Hypertension    dr Otho Najjar     . Hypothyroidism   . RLS (restless legs syndrome)   . Shortness of breath   . Sleep apnea    cpap      >5 yrs    Past Surgical History:  Procedure Laterality Date  . BACK SURGERY     cervical  . BREAST BIOPSY Left    needle bx-neg  . FOOT ARTHROPLASTY    . JOINT REPLACEMENT     x2 tkr  . TOTAL SHOULDER ARTHROPLASTY Left 07/02/2013   Procedure: LEFT TOTAL SHOULDER ARTHROPLASTY;  Surgeon: Marin Shutter, MD;  Location: [place];  Service: Orthopedics;  Laterality: Left;    There were no vitals filed for this visit.      Subjective Assessment - 05/02/16 0954    Subjective Pt. arrived to PT with B crutches and brought her own [facility] today and reports she has been practicing at home with it.    Pertinent History 71 yo female referred for PT evaluation for bilateral LE weakness. Pt reports that the weakness is gradually worsening. Pt has a history of balance deficits with 5 falls over the course of 2 weeks in 06/2015. Pt reports only one falls since that time. Pt is complaining of L hip weakness at  this time. She has a history of left knee patella fracture, bilateral knee replacements, and a L shoulder replacement. She ambulates with axillary crutches since 2013 after a L foot surgery. She has been actively working out with a trainer at [employer] but stopped approximately 1 month ago. Pt reports she went out of town, got sick, and then had financial constraints which prevented her from returning to her trainer. Pt a history of neuropathy in bilateral feet but reports it as mild. Pt denies other changes in her health recently. ROS negative for red flags.    Limitations Walking;Standing;House hold activities   How long can you sit comfortably? unlimited   Patient Stated Goals get rid of crutches and walk unassisted. "Increase my endurance." Pt reports primary limiting factor in walking endurance is use of crutches   Currently in Pain? No/denies     OBJECTIVE:  Nustep L5 10 min. B UE/LE (warm-up prior to tx. Session)- no charge.    Gait Training: Ambulating with SPT in hallway 6x 70 ft. With CGA. Pt. Provided cueing to increase step length and keep a steady breathing cadence during ambulation.  Turn pivot transfer to chair with SPT: CGA. Pt. Able to control  descent well despite feeling weak in LLE today. Pt. Instructed on increasing base of support during gait and using hip flexors to ensure proper foot clearance onto different surfaces. Pt. Was fearful of ambulating over blue foam pad but was able to show good ankle control and no periods of LOB. Pt. Ambulated through the grass outside with [facility] and demonstrated a side-step down off the curb to parking lot safely and with min CGA. Pt. Still limited in heelstrike but understands importance of clearing each foot during swing phase.  Pt. Given new set of exercises to perform at home focusing on glute and hip flexor strengthening (marching, side steps, and sit to stands) in addition to practicing at-home ambulation.  Pt. Continues to do   30 min cross country exercises on noodle in water and leg lifts, squats in water, and balance in water.         PT Education - 05/02/16 1058    Education provided Yes   Education Details pt. educated on performing side step w/ squat at kitchen counter along with sit to stands at home.    Person(s) Educated Patient   Methods Explanation;Demonstration   Comprehension Verbalized understanding;Returned demonstration             PT Long Term Goals - 04/12/16 1221      PT LONG TERM GOAL #1   Title Patient will be independent in home exercise program to improve strength/mobility for better functional independence with ADLs.   Time 4   Period Weeks   Status On-going     PT LONG TERM GOAL #2   Title Pt will decrease TUG by at least 5 seconds in order to demonstrate decreased fall risk    Baseline 03/01/16: 37.2 seconds   Time 4   Period Weeks   Status On-going     PT LONG TERM GOAL #3   Title Pt will increase 6MWT by at least 54m (142ft) in order to demonstrate clinically significant improvement in cardiopulmonary endurance and community ambulation   Baseline 03/01/16: 345'   Time 4   Period Weeks   Status On-going     PT LONG TERM GOAL #4   Title Pt will increase 10MWT speed by at least 0.13 m/s in order to demonstrate clinically significant improvement in community ambulation.   Baseline 03/01/16: 0.44 m/s   Time 4   Period Weeks   Status On-going               Plan - 05/02/16 1102    Clinical Impression Statement Patient was able to ambulate 70 ft. w/ no rest breaks for multiple trials with SPC and CGA from PT. Patient is still working on increasing confidence with use of SPC and has been practicing at home. Pt. was monitored for Sp02 levels and HR after each trial of ambulation and remained at her typical levels (between 92 and 94% O2) throughout. Pt. was able to navigate different surfaces such has a foam mat and negotiate turns between cones without any LOB.  Pt. cued to maintain LE control during stand to sit and reported some feelings of weakness in LLE. Pt. is remaining consistent with going to the gym multiple times a week to use exercise machines and swim. Pt. will benefit from continued skilled PT in order to progress gait training with SPC, gross LE strengthening, and balance to improve stability during ambulation.    Rehab Potential Good   Clinical Impairments Affecting Rehab Potential positive: motivation, family support, negative:  co-morbidities, deconditioned, high fall risk; Body structures that will need to be addressed: weakness, numbness/tingling, impaired balance, difficulty walking, decreased transfer ability, etc; Due to co-morbidities her clinical presenstation is evolving;    PT Frequency 1x / week   PT Duration 4 weeks   PT Treatment/Interventions Aquatic Therapy;Cryotherapy;Electrical Stimulation;Moist Heat;Balance training;Therapeutic exercise;Therapeutic activities;Functional mobility training;Stair training;Gait training;DME Instruction;Neuromuscular re-education;Patient/family education;Energy conservation;ADLs/Self Care Home Management;Ultrasound;Manual techniques;Passive range of motion   PT Next Visit Plan Progress balance and LE strength program. Progress SPC ambulation on various surfaces   PT Home Exercise Plan Sidestep w/ squat, standing marching, and sit to stands added for HEP (pt. declined handout)   Consulted and Agree with Plan of Care Patient      Patient will benefit from skilled therapeutic intervention in order to improve the following deficits and impairments:  Abnormal gait, Decreased endurance, Impaired sensation, Obesity, Cardiopulmonary status limiting activity, Decreased activity tolerance, Decreased strength, Difficulty walking, Decreased mobility, Decreased balance, Impaired flexibility, Decreased safety awareness  Visit Diagnosis: Muscle weakness (generalized)  Unsteadiness on feet  Imbalance  At  risk for falls     Problem List Patient Active Problem List   Diagnosis Date Noted  . Acute on chronic diastolic CHF (congestive heart failure) ([place]) 07/21/2015  . COPD exacerbation ([place]) 07/21/2015  . Acute renal insufficiency 07/21/2015  . Hyperglycemia 07/21/2015  . Benign essential HTN 07/21/2015  . Acute on chronic respiratory failure with hypoxia and hypercapnia ([place]) 07/18/2015  . S/P shoulder replacement 07/02/2013   Pura Spice, PT, DPT # [id number] SPT 05/02/2016, 12:13 PM  [place] [facility] [address] [place], [name], [address] Phone: [phone]   Fax:  [phone]  Name: Barnette Lofgreen MRN: [id number] Date of Birth: 1946/03/09

## 2016-05-05 DIAGNOSIS — R0902 Hypoxemia: Secondary | ICD-10-CM | POA: Diagnosis not present

## 2016-05-05 DIAGNOSIS — J449 Chronic obstructive pulmonary disease, unspecified: Secondary | ICD-10-CM | POA: Diagnosis not present

## 2016-05-05 DIAGNOSIS — I509 Heart failure, unspecified: Secondary | ICD-10-CM | POA: Diagnosis not present

## 2016-05-05 DIAGNOSIS — R0689 Other abnormalities of breathing: Secondary | ICD-10-CM | POA: Diagnosis not present

## 2016-05-09 ENCOUNTER — Ambulatory Visit: Payer: Medicare HMO | Admitting: Physical Therapy

## 2016-05-09 DIAGNOSIS — R2689 Other abnormalities of gait and mobility: Secondary | ICD-10-CM

## 2016-05-09 DIAGNOSIS — R2681 Unsteadiness on feet: Secondary | ICD-10-CM

## 2016-05-09 DIAGNOSIS — Z9181 History of falling: Secondary | ICD-10-CM | POA: Diagnosis not present

## 2016-05-09 DIAGNOSIS — M6281 Muscle weakness (generalized): Secondary | ICD-10-CM | POA: Diagnosis not present

## 2016-05-09 NOTE — Therapy (Signed)
[place] [facility] [address] [place], [name], [address] Phone: [phone]   Fax:  [phone]  Physical Therapy Treatment  Patient Details  Name: Hannah Powell MRN: [id number] Date of Birth: 25-Mar-1945 Referring Provider: Jennings Books  Encounter Date: 05/09/2016      PT End of Session - 05/09/16 1239    Visit Number 8   Number of Visits 12   Date for PT Re-Evaluation 05/09/16   Authorization - Visit Number 8   Authorization - Number of Visits 14   PT Start Time [phone]   PT Stop Time 1040   PT Time Calculation (min) 58 min   Equipment Utilized During Treatment Gait belt   Activity Tolerance Patient tolerated treatment well   Behavior During Therapy [facility] for tasks assessed/performed      Past Medical History:  Diagnosis Date  . Anxiety   . Arthritis   . CHF (congestive heart failure) ([place])   . Hypertension    dr Otho Najjar     . Hypothyroidism   . RLS (restless legs syndrome)   . Shortness of breath   . Sleep apnea    cpap      >5 yrs    Past Surgical History:  Procedure Laterality Date  . BACK SURGERY     cervical  . BREAST BIOPSY Left    needle bx-neg  . FOOT ARTHROPLASTY    . JOINT REPLACEMENT     x2 tkr  . TOTAL SHOULDER ARTHROPLASTY Left 07/02/2013   Procedure: LEFT TOTAL SHOULDER ARTHROPLASTY;  Surgeon: Marin Shutter, MD;  Location: [place];  Service: Orthopedics;  Laterality: Left;    There were no vitals filed for this visit.      Subjective Assessment - 05/09/16 1046    Subjective Pt. arrived to PT clinic with [facility] and ambulated in from her car with it. Pt. reports no pain today and has been consistent with exercising at the gym and in the pool.   Pertinent History 71 yo female referred for PT evaluation for bilateral LE weakness. Pt reports that the weakness is gradually worsening. Pt has a history of balance deficits with 5 falls over the course of 2 weeks in 06/2015. Pt reports only one falls since that  time. Pt is complaining of L hip weakness at this time. She has a history of left knee patella fracture, bilateral knee replacements, and a L shoulder replacement. She ambulates with axillary crutches since 2013 after a L foot surgery. She has been actively working out with a trainer at [employer] but stopped approximately 1 month ago. Pt reports she went out of town, got sick, and then had financial constraints which prevented her from returning to her trainer. Pt a history of neuropathy in bilateral feet but reports it as mild. Pt denies other changes in her health recently. ROS negative for red flags.    Limitations Walking;Standing;House hold activities   How long can you sit comfortably? unlimited   Patient Stated Goals get rid of crutches and walk unassisted. "Increase my endurance." Pt reports primary limiting factor in walking endurance is use of crutches   Currently in Pain? No/denies        OBJECTIVE:  Nustep L6 10 min. B UE/LE (warm-up prior to tx. Session)- no charge.    Gait Training: Ambulating with SPT in clinic/hallway with B loftstrands with CGA. Pt.  Several turn pivot transfers to chair with SPT:  Pt. Instructed on increasing base  of support during gait and using hip flexors to ensure proper foot clearance onto different surfaces.Pt. Ambulated with B loftstrands down a cemented incline slowly but safely and back up the incline w/ no LOB. Pt. Still limited in heelstrike but understands importance of clearing each foot during swing phase.  Pt. Ascended/descended 4 stairs with B and single loftstrands. Pt. Uses a step-to gait pattern for both ascend/descending but is able to perform both safely. Pt. Educated on how to descend stairs with AD descending each step before her Les. Pt. Ambulated to and from car in parking lot to clinic with [facility] with SBA for safety. Pt.'s endurance has improved greatly and does not fatigue with ambulating around/outside clinic.  Pt. Is  progressing well with progression of assistive devices and will benefit from continued skilled PT to progress gait to most independent device and increase B LE strength.    Pt. Continues to do  30 min cross country exercises on noodle in water and leg lifts, squats in water, and balance in water as well as gym-based exercises focusing on cardiovascular endurance and LE strength.           PT Long Term Goals - 04/12/16 1221      PT LONG TERM GOAL #1   Title Patient will be independent in home exercise program to improve strength/mobility for better functional independence with ADLs.   Time 4   Period Weeks   Status On-going     PT LONG TERM GOAL #2   Title Pt will decrease TUG by at least 5 seconds in order to demonstrate decreased fall risk    Baseline 03/01/16: 37.2 seconds   Time 4   Period Weeks   Status On-going     PT LONG TERM GOAL #3   Title Pt will increase 6MWT by at least 32m (150ft) in order to demonstrate clinically significant improvement in cardiopulmonary endurance and community ambulation   Baseline 03/01/16: 345'   Time 4   Period Weeks   Status On-going     PT LONG TERM GOAL #4   Title Pt will increase 10MWT speed by at least 0.13 m/s in order to demonstrate clinically significant improvement in community ambulation.   Baseline 03/01/16: 0.44 m/s   Time 4   Period Weeks   Status On-going            Plan - 05/09/16 1239    Clinical Impression Statement Pt. presented to PT clinic with [facility] for first time without axillary crutches. Pt. has had no falls practicing with cane at home. Pt. tried B Loftsrand crutches for first time today first in parallel bars and then out of //-bars. Pt. ambulated first with 4-point gait pattern and quickly progressed to 2-point gait pattern with ability to maintain consistent step pattern and no LOB. Pt. ambulated outdoors down incline, on uneven terrain, and up an incline and through the clinic.  Pt. then used single  Loftstrand in left UE in //-bars for practice.  Pt. ascended and descended 4 stairs with B loftstrands 1x and with single loftsrand 1x. Pt. is able to maintain increased upright posture with use of loftstrands as compared to B axillary crutches or SPC. [facility] training will remain in pt's POC but pt. is going to acquire personal loftstrands for use in and out of her home. Pt. remains fearful of ambulating with only SPC but is working hard each session. Pt. remains consistent with gym program focusing on LE strengthening and aquatic therapy.  Rehab Potential Good   Clinical Impairments Affecting Rehab Potential positive: motivation, family support, negative: co-morbidities, deconditioned, high fall risk; Body structures that will need to be addressed: weakness, numbness/tingling, impaired balance, difficulty walking, decreased transfer ability, etc; Due to co-morbidities her clinical presenstation is evolving;    PT Frequency 1x / week   PT Duration 4 weeks   PT Treatment/Interventions Aquatic Therapy;Cryotherapy;Electrical Stimulation;Moist Heat;Balance training;Therapeutic exercise;Therapeutic activities;Functional mobility training;Stair training;Gait training;DME Instruction;Neuromuscular re-education;Patient/family education;Energy conservation;ADLs/Self Care Home Management;Ultrasound;Manual techniques;Passive range of motion   PT Next Visit Plan Progress balance and LE strength program. Progress SPC ambulation on various surfaces   PT Home Exercise Plan Sidestep w/ squat, standing marching, and sit to stands added for HEP (pt. declined handout).   RECERT NEXT TX SESSION   Consulted and Agree with Plan of Care Patient      Patient will benefit from skilled therapeutic intervention in order to improve the following deficits and impairments:  Abnormal gait, Decreased endurance, Impaired sensation, Obesity, Cardiopulmonary status limiting activity, Decreased activity tolerance, Decreased strength,  Difficulty walking, Decreased mobility, Decreased balance, Impaired flexibility, Decreased safety awareness  Visit Diagnosis: Muscle weakness (generalized)  Unsteadiness on feet  Imbalance     Problem List Patient Active Problem List   Diagnosis Date Noted  . Acute on chronic diastolic CHF (congestive heart failure) ([place]) 07/21/2015  . COPD exacerbation ([place]) 07/21/2015  . Acute renal insufficiency 07/21/2015  . Hyperglycemia 07/21/2015  . Benign essential HTN 07/21/2015  . Acute on chronic respiratory failure with hypoxia and hypercapnia ([place]) 07/18/2015  . S/P shoulder replacement 07/02/2013   Pura Spice, PT, DPT # [address] SPT 05/10/2016, 8:01 AM  [place] [facility] [address] [place], [name], [address] Phone: [phone]   Fax:  [phone]  Name: Karan Hollaway MRN: [id number] Date of Birth: [birth year]

## 2016-05-16 ENCOUNTER — Encounter: Payer: Medicare HMO | Admitting: Physical Therapy

## 2016-05-16 DIAGNOSIS — I10 Essential (primary) hypertension: Secondary | ICD-10-CM | POA: Diagnosis not present

## 2016-05-16 DIAGNOSIS — M5136 Other intervertebral disc degeneration, lumbar region: Secondary | ICD-10-CM | POA: Diagnosis not present

## 2016-05-16 DIAGNOSIS — J449 Chronic obstructive pulmonary disease, unspecified: Secondary | ICD-10-CM | POA: Diagnosis not present

## 2016-05-16 DIAGNOSIS — M545 Low back pain: Secondary | ICD-10-CM | POA: Diagnosis not present

## 2016-05-16 DIAGNOSIS — M5416 Radiculopathy, lumbar region: Secondary | ICD-10-CM | POA: Diagnosis not present

## 2016-05-17 ENCOUNTER — Ambulatory Visit: Payer: Medicare HMO | Attending: Neurology | Admitting: Physical Therapy

## 2016-05-17 DIAGNOSIS — R2689 Other abnormalities of gait and mobility: Secondary | ICD-10-CM | POA: Diagnosis not present

## 2016-05-17 DIAGNOSIS — Z9181 History of falling: Secondary | ICD-10-CM | POA: Insufficient documentation

## 2016-05-17 DIAGNOSIS — M6281 Muscle weakness (generalized): Secondary | ICD-10-CM | POA: Insufficient documentation

## 2016-05-17 DIAGNOSIS — R2681 Unsteadiness on feet: Secondary | ICD-10-CM | POA: Diagnosis not present

## 2016-05-17 NOTE — Therapy (Signed)
[facility] [address]. [place], [name], [address] Phone: [phone]   Fax:  [phone]  Physical Therapy Treatment  Patient Details  Name: Hannah Powell MRN: [id number] Date of Birth: [birth year] Referring Provider: Jennings Books  Encounter Date: 05/17/2016      PT End of Session - 05/17/16 1235    Visit Number 9   Number of Visits 13   Date for PT Re-Evaluation 06/14/16   Authorization - Visit Number 9   Authorization - Number of Visits 14   PT Start Time 0908   PT Stop Time 1007   PT Time Calculation (min) 59 min   Equipment Utilized During Treatment Gait belt   Activity Tolerance Patient tolerated treatment well   Behavior During Therapy [facility] for tasks assessed/performed      Past Medical History:  Diagnosis Date  . Anxiety   . Arthritis   . CHF (congestive heart failure) ([place])   . Hypertension    dr Otho Najjar     . Hypothyroidism   . RLS (restless legs syndrome)   . Shortness of breath   . Sleep apnea    cpap      >71 yrs    Past Surgical History:  Procedure Laterality Date  . BACK SURGERY     cervical  . BREAST BIOPSY Left    needle bx-neg  . FOOT ARTHROPLASTY    . JOINT REPLACEMENT     x2 tkr  . TOTAL SHOULDER ARTHROPLASTY Left 07/02/2013   Procedure: LEFT TOTAL SHOULDER ARTHROPLASTY;  Surgeon: Marin Shutter, MD;  Location: [facility];  Service: Orthopedics;  Laterality: Left;    There were no vitals filed for this visit.      Subjective Assessment - 05/17/16 1234    Subjective Pt. has purchased Lofstrand crutches and brought them to her session today. She has been consistant with her exercise at the gym and in the pool.    Pertinent History 71 yo female referred for PT evaluation for bilateral LE weakness. Pt reports that the weakness is gradually worsening. Pt has a history of balance deficits with 5 falls over the course of 2 weeks in 06/2015. Pt reports only one falls since that time. Pt is  complaining of L hip weakness at this time. She has a history of left knee patella fracture, bilateral knee replacements, and a L shoulder replacement. She ambulates with axillary crutches since 2013 after a L foot surgery. She has been actively working out with a trainer at [employer] but stopped approximately 1 month ago. Pt reports she went out of town, got sick, and then had financial constraints which prevented her from returning to her trainer. Pt a history of neuropathy in bilateral feet but reports it as mild. Pt denies other changes in her health recently. ROS negative for red flags.    Limitations Walking;Standing;House hold activities   How long can you sit comfortably? unlimited   Patient Stated Goals get rid of crutches and walk unassisted. "Increase my endurance." Pt reports primary limiting factor in walking endurance is use of crutches   Currently in Pain? No/denies     Gait: TUG (timed up and go): 3 trials with Lofstrand crutches, 1 trial with SPC. Improved speed with increased trials. Step length increased after second trial with better heel strike and trunk alignment.CGA.  10MWT: 4 trials with Lofstrand crutches, 2 trials with SPC. Fatigue after each trial with decreased acceleration with cane due to  fear of falling. CGA.  TherEx: Sit to stand with Lofstrand crutches: Educated on proper technique to increase speed and safety. Required frequent trials due to fatigue. CGA   Pt. Is progressing well with progression of assistive devices and will benefit from continued skilled PT to progress gait to most independent device and increase B LE strength.         PT Education - 05/17/16 1235    Education provided Yes   Education Details Pt. educated on sit to stand with lofstrand crutches   Person(s) Educated Patient   Methods Explanation;Demonstration   Comprehension Verbalized understanding;Returned demonstration             PT Long Term Goals - 05/17/16 1239       PT LONG TERM GOAL #1   Title Patient will be independent in home exercise program to improve strength/mobility for better functional independence with ADLs.   Baseline Patient continues to progress with HEP   Time 4   Period Weeks   Status Partially Met     PT LONG TERM GOAL #2   Title Pt will decrease TUG by at least 5 seconds in order to demonstrate decreased fall risk    Baseline 05/17/16: 27.32; =9 minute decrease with Lofstrand 33.82 with cane= partially met 03/01/16: 37.2 seconds   Time 4   Period Weeks   Status Partially Met     PT LONG TERM GOAL #3   Title Pt will increase 6MWT by at least 66m(1694f in order to demonstrate clinically significant improvement in cardiopulmonary endurance and community ambulation   Baseline will test next visit, 03/01/16: 345'   Time 4   Period Weeks   Status On-going     PT LONG TERM GOAL #4   Title Pt will increase 10MWT speed by at least 0.13 m/s in order to demonstrate clinically significant improvement in community ambulation.   Baseline 05/17/16: .4525mwith Lofstrand crutches .34 m/s with SPC, 03/01/16: 0.44 m/s   Time 4   Period Weeks   Status On-going     PT LONG TERM GOAL #5   Title Patient will perform 10 consistant sit to stands with Lofstrand crutches in an efficient manner to improve transfer ability in the community.    Baseline Patient is very slow and fatigues quickly from performing sit to stands with Lofstrand crutches, having to pause at top to readjust handles.    Time 4   Period Weeks   Status New               Plan - 05/17/16 1805    Clinical Impression Statement Patient presented to PT clinic with newly purchased lofstrand crutches. Pt. has had no falls and feels safer utilizing Lofstrand crutches. Crutches required adjustment for proper fit and pt. demonstrated improved two point gait mechanics post adjustment. Pt. performed 6m5mk test with Lofstrand (.41 m/s) and SPC (.81m/36mPatient performed the  TUG and had a 9 minute decrease with use of Lofstrand crutches and approximately a 4 minute decrease with cane from prior testing. Patient was educated on sit to standing with Lofstrand crutches to promote functionality. Pt. continues to be fearful of ambulating with only SPC but work diligently throughout session. Patient will benefit from continued skilled physical therapy in order to progress gait training to least assistive device, gross LE strengthening, and balance to improve stability during ambulation to allow increased independence navigating her natural environment.    Rehab Potential Good   Clinical Impairments Affecting  Rehab Potential positive: motivation, family support, negative: co-morbidities, deconditioned, high fall risk; Body structures that will need to be addressed: weakness, numbness/tingling, impaired balance, difficulty walking, decreased transfer ability, etc; Due to co-morbidities her clinical presenstation is evolving;    PT Frequency 1x / week   PT Duration 4 weeks   PT Treatment/Interventions Aquatic Therapy;Cryotherapy;Electrical Stimulation;Moist Heat;Balance training;Therapeutic exercise;Therapeutic activities;Functional mobility training;Stair training;Gait training;DME Instruction;Neuromuscular re-education;Patient/family education;Energy conservation;ADLs/Self Care Home Management;Ultrasound;Manual techniques;Passive range of motion   PT Next Visit Plan Progress balance and LE strength program. Progress SPC ambulation on various surfaces   PT Home Exercise Plan Sidestep w/ squat, standing marching, and sit to stands added for HEP (pt. declined handout).   RECERT NEXT TX SESSION   Consulted and Agree with Plan of Care Patient      Patient will benefit from skilled therapeutic intervention in order to improve the following deficits and impairments:  Abnormal gait, Decreased endurance, Impaired sensation, Obesity, Cardiopulmonary status limiting activity, Decreased  activity tolerance, Decreased strength, Difficulty walking, Decreased mobility, Decreased balance, Impaired flexibility, Decreased safety awareness  Visit Diagnosis: Muscle weakness (generalized)  Unsteadiness on feet  Imbalance  At risk for falls  Other abnormalities of gait and mobility       G-Codes - 05/23/16 1441    Functional Assessment Tool Used (Outpatient Only) 5TSTS, 38mwalk time, TUG, 6 minute walk test, clinical judgement   Functional Limitation Mobility: Walking and moving around   Mobility: Walking and Moving Around Current Status ([phone] At least 20 percent but less than 40 percent impaired, limited or restricted   Mobility: Walking and Moving Around Goal Status ([phone] At least 1 percent but less than 20 percent impaired, limited or restricted      Problem List Patient Active Problem List   Diagnosis Date Noted  . Acute on chronic diastolic CHF (congestive heart failure) ([place] 07/21/[place]  . [place] exacerbation ([place] 07/21/2015  . Acute renal insufficiency 07/21/2015  . Hyperglycemia 07/21/2015  . Benign essential HTN 07/21/2015  . Acute on chronic respiratory failure with hypoxia and hypercapnia ([place] 07/18/2015  . S/P shoulder replacement 07/02/2013   MPura Spice PT, DPT # [id number] Arch SPT 05/18/2016, 2:46 PM  [place] [facility]MRoscoe NAlaska [address]Phone: [phone]  Fax:  [phone] Name: SShery WaunekaMRN: [id number] of Birth: 9Sep 08, 1947

## 2016-05-18 ENCOUNTER — Encounter: Payer: Self-pay | Admitting: Physical Therapy

## 2016-05-21 DIAGNOSIS — R29898 Other symptoms and signs involving the musculoskeletal system: Secondary | ICD-10-CM | POA: Diagnosis not present

## 2016-05-21 DIAGNOSIS — R296 Repeated falls: Secondary | ICD-10-CM | POA: Diagnosis not present

## 2016-05-28 ENCOUNTER — Telehealth: Payer: Self-pay | Admitting: Internal Medicine

## 2016-05-28 NOTE — Telephone Encounter (Signed)
Pt states her O2 stats are in the 80s, she is coughing, she thinks she may have pneumonial or bronchitis. States she is going out of town the end of the month, and would like to get treatment before then. Please call.

## 2016-05-28 NOTE — Telephone Encounter (Signed)
Pt informed she needs to go to ER per DR. Pt states she will see if she can get in with Dr. Caryl Comes and if not she will go to ER. Nothing further needed.

## 2016-05-28 NOTE — Telephone Encounter (Signed)
Pt called back and states Dr. Caryl Comes is calling an abx and steroid and she is scheduled to see him 05/29/16. Nothing further needed.

## 2016-05-29 ENCOUNTER — Telehealth: Payer: Self-pay | Admitting: Internal Medicine

## 2016-05-29 DIAGNOSIS — I5032 Chronic diastolic (congestive) heart failure: Secondary | ICD-10-CM | POA: Diagnosis not present

## 2016-05-29 DIAGNOSIS — J438 Other emphysema: Secondary | ICD-10-CM | POA: Diagnosis not present

## 2016-05-29 DIAGNOSIS — J441 Chronic obstructive pulmonary disease with (acute) exacerbation: Secondary | ICD-10-CM | POA: Diagnosis not present

## 2016-05-29 DIAGNOSIS — J811 Chronic pulmonary edema: Secondary | ICD-10-CM | POA: Diagnosis not present

## 2016-05-29 DIAGNOSIS — J9611 Chronic respiratory failure with hypoxia: Secondary | ICD-10-CM | POA: Diagnosis not present

## 2016-05-29 NOTE — Telephone Encounter (Signed)
Patient called an wanted to let you know she has an appt today with Dr. Caryn Section.

## 2016-05-29 NOTE — Telephone Encounter (Signed)
Spoke with pt yesterday in previous phone note and she informed me at that time. Will close encounter at this time.

## 2016-05-30 ENCOUNTER — Telehealth: Payer: Self-pay | Admitting: Internal Medicine

## 2016-05-30 NOTE — Telephone Encounter (Signed)
Please call patient to discuss copd flare .

## 2016-05-30 NOTE — Telephone Encounter (Signed)
LMOM for pt to return call. 

## 2016-05-30 NOTE — Telephone Encounter (Signed)
Spoke with pt and she states she followed with Dr. Caryl Comes. We scheduled pt a f/u for April due to needing to f/u from this exacerbation. Nothing further needed.

## 2016-06-02 DIAGNOSIS — R0902 Hypoxemia: Secondary | ICD-10-CM | POA: Diagnosis not present

## 2016-06-02 DIAGNOSIS — R0689 Other abnormalities of breathing: Secondary | ICD-10-CM | POA: Diagnosis not present

## 2016-06-02 DIAGNOSIS — I509 Heart failure, unspecified: Secondary | ICD-10-CM | POA: Diagnosis not present

## 2016-06-02 DIAGNOSIS — J449 Chronic obstructive pulmonary disease, unspecified: Secondary | ICD-10-CM | POA: Diagnosis not present

## 2016-06-16 DIAGNOSIS — J449 Chronic obstructive pulmonary disease, unspecified: Secondary | ICD-10-CM | POA: Diagnosis not present

## 2016-06-30 ENCOUNTER — Ambulatory Visit
Admission: EM | Admit: 2016-06-30 | Discharge: 2016-06-30 | Disposition: A | Discharge status: Home / Self Care | Payer: Medicare HMO | Attending: Family Medicine | Admitting: Family Medicine

## 2016-06-30 ENCOUNTER — Encounter: Payer: Self-pay | Admitting: Gynecology

## 2016-06-30 DIAGNOSIS — R300 Dysuria: Secondary | ICD-10-CM | POA: Diagnosis not present

## 2016-06-30 DIAGNOSIS — N390 Urinary tract infection, site not specified: Secondary | ICD-10-CM

## 2016-06-30 LAB — URINALYSIS, COMPLETE (UACMP) WITH MICROSCOPIC
BACTERIA UA: NONE SEEN
Bilirubin Urine: NEGATIVE
Glucose, UA: NEGATIVE mg/dL
Hgb urine dipstick: NEGATIVE
Ketones, ur: NEGATIVE mg/dL
Nitrite: NEGATIVE
PH: 5.5 (ref 5.0–8.0)
Protein, ur: NEGATIVE mg/dL
RBC / HPF: NONE SEEN RBC/hpf (ref 0–5)
SPECIFIC GRAVITY, URINE: 1.01 (ref 1.005–1.030)

## 2016-06-30 MED ORDER — PHENAZOPYRIDINE HCL 200 MG PO TABS: 200.0000 mg | ORAL_TABLET | Freq: Three times a day (TID) | ORAL | 0 refills | Status: DC | PRN

## 2016-06-30 MED ORDER — NITROFURANTOIN MONOHYD MACRO 100 MG PO CAPS: 100.0000 mg | ORAL_CAPSULE | Freq: Two times a day (BID) | ORAL | 0 refills | Status: DC

## 2016-06-30 MED ORDER — AMOXICILLIN-POT CLAVULANATE 875-125 MG PO TABS: 1.0000 | ORAL_TABLET | Freq: Two times a day (BID) | ORAL | 0 refills | Status: DC

## 2016-06-30 MED ORDER — CEFTRIAXONE SODIUM 1 G IJ SOLR
1.0000 g | Freq: Once | INTRAMUSCULAR | Status: DC
Start: 2016-06-30 — End: 2016-06-30

## 2016-06-30 NOTE — ED Triage Notes (Signed)
Per patient abdominal pain/ burning with urination.

## 2016-06-30 NOTE — ED Provider Notes (Signed)
[facility]    CSN: [id number] Arrival date & time: 06/30/16  1547     History   Chief Complaint Chief Complaint  Patient presents with  . Urinary Tract Infection    HPI Hannah Powell is a 71 y.o. female.   Patient's here because of symptoms of UTI. She reports symptoms [name] today. She reports lower abdominal pain in the very bottom of the abdomen and frequency. She has some chills earlier today but no true fever. She's had UTI one time before stasis is very similar to what was. She denies any type of vaginal discharge dyspareunia other fascial problems. She's had multiple orthopedic surgery of both knees replaced shoulder and cervical spine fusion as well. She's also developed COPD though she stopped smoking over 20 years ago. She has a history of CHF and hypertension as well. No pertinent family medical history. She is allergic to Lyrica.   The history is provided by the patient. No language interpreter was used.  Urinary Tract Infection  Pain quality:  Burning, stabbing and shooting Pain severity:  Moderate Onset quality:  Sudden Duration:  7 hours Timing:  Constant Progression:  Worsening Chronicity:  New Recent urinary tract infections: no   Relieved by:  Nothing Worsened by:  Nothing Urinary symptoms: frequent urination   Associated symptoms: abdominal pain   Associated symptoms: no fever, no genital lesions and no vaginal discharge   Risk factors: no recurrent urinary tract infections, no renal disease, no single kidney and no urinary catheter     Past Medical History:  Diagnosis Date  . Anxiety   . Arthritis   . CHF (congestive heart failure) ([place])   . Hypertension    dr Otho Najjar     . Hypothyroidism   . RLS (restless legs syndrome)   . Shortness of breath   . Sleep apnea    cpap      >5 yrs    Patient Active Problem List   Diagnosis Date Noted  . Acute on chronic diastolic CHF (congestive heart failure) ([place]) 07/21/2015  . COPD  exacerbation ([place]) 07/21/2015  . Acute renal insufficiency 07/21/2015  . Hyperglycemia 07/21/2015  . Benign essential HTN 07/21/2015  . Acute on chronic respiratory failure with hypoxia and hypercapnia ([place]) 07/18/2015  . S/P shoulder replacement 07/02/2013    Past Surgical History:  Procedure Laterality Date  . BACK SURGERY     cervical  . BREAST BIOPSY Left    needle bx-neg  . FOOT ARTHROPLASTY    . JOINT REPLACEMENT     x2 tkr  . TOTAL SHOULDER ARTHROPLASTY Left 07/02/2013   Procedure: LEFT TOTAL SHOULDER ARTHROPLASTY;  Surgeon: Marin Shutter, MD;  Location: [facility];  Service: Orthopedics;  Laterality: Left;    OB History    No data available       Home Medications    Prior to Admission medications   Medication Sig Start Date End Date Taking? Authorizing Provider  aspirin EC 81 MG tablet Take 81 mg by mouth daily.   Yes Historical Provider, MD  B Complex-C (B-COMPLEX WITH VITAMIN C) tablet Take 1 tablet by mouth daily.   Yes Historical Provider, MD  Calcium Carbonate-Vitamin D (CALCIUM-VITAMIN D) 500-200 MG-UNIT per tablet Take 1 tablet by mouth daily.   Yes Historical Provider, MD  doxycycline (VIBRA-TABS) 100 MG tablet Take 1 tablet (100 mg total) by mouth 2 (two) times daily. 03/02/16  Yes Wilhelmina Mcardle, MD  fluticasone furoate-vilanterol (BREO ELLIPTA) 100-25  MCG/INH AEPB Inhale 1 puff into the lungs daily. 02/22/16  Yes Flora Lipps, MD  furosemide (LASIX) 40 MG tablet Take 1 tablet (40 mg total) by mouth daily. 07/21/15  Yes Theodoro Grist, MD  guaiFENesin (MUCINEX) 600 MG 12 hr tablet Take 1 tablet (600 mg total) by mouth 2 (two) times daily. 07/21/15  Yes Theodoro Grist, MD  ipratropium-albuterol (DUONEB) 0.5-2.5 (3) MG/3ML SOLN Take 3 mLs by nebulization every 4 (four) hours. Patient taking differently: Take 3 mLs by nebulization every 4 (four) hours as needed.  07/21/15  Yes Theodoro Grist, MD  levothyroxine (SYNTHROID, LEVOTHROID) 200 MCG tablet Take 200 mcg by mouth daily  before breakfast.   Yes Historical Provider, MD  losartan (COZAAR) 25 MG tablet Take 25 mg by mouth daily.   Yes Historical Provider, MD  metolazone (ZAROXOLYN) 5 MG tablet Take 5 mg by mouth daily. 01/20/16 01/19/17 Yes Historical Provider, MD  Omega-3 Fatty Acids (OMEGA 3 PO) Take 1 capsule by mouth daily.   Yes Historical Provider, MD  pramipexole (MIRAPEX) 1 MG tablet Take 4 mg by mouth at bedtime.    Yes Historical Provider, MD  roflumilast (DALIRESP) 500 MCG TABS tablet Take 1 tablet (500 mcg total) by mouth daily. 02/16/16  Yes Flora Lipps, MD  zolpidem (AMBIEN) 10 MG tablet Take by mouth. 12/30/15  Yes Historical Provider, MD  chlorpheniramine-HYDROcodone (TUSSIONEX PENNKINETIC ER) 10-8 MG/5ML SUER Take 5 mLs by mouth 2 (two) times daily. Patient taking differently: Take 5 mLs by mouth 2 (two) times daily as needed.  07/21/15   Theodoro Grist, MD  guaiFENesin-codeine 100-10 MG/5ML syrup Take 10 mLs by mouth every 4 (four) hours as needed for cough. 07/21/15   Theodoro Grist, MD  nitrofurantoin, macrocrystal-monohydrate, (MACROBID) 100 MG capsule Take 1 capsule (100 mg total) by mouth 2 (two) times daily. 06/30/16   Frederich Cha, MD  phenazopyridine (PYRIDIUM) 200 MG tablet Take 1 tablet (200 mg total) by mouth 3 (three) times daily as needed for pain. 06/30/16   Frederich Cha, MD    Family History Family History  Problem Relation Age of Onset  . Breast cancer Neg Hx     Social History Social History  Substance Use Topics  . Smoking status: Former Smoker    Packs/day: 1.00    Years: 10.00    Quit date: 03/20/1975  . Smokeless tobacco: Never Used  . Alcohol use Yes     Comment: occ     Allergies   Lyrica [pregabalin]   Review of Systems Review of Systems  Constitutional: Negative for fever.  Gastrointestinal: Positive for abdominal pain.  Genitourinary: Positive for dysuria and urgency. Negative for vaginal discharge.  All other systems reviewed and are negative.    Physical  Exam Triage Vital Signs ED Triage Vitals  Enc Vitals Group     BP 06/30/16 1610 (!) 137/113     Pulse Rate 06/30/16 1610 (!) 109     Resp 06/30/16 1610 16     Temp 06/30/16 1610 99 F (37.2 C)     Temp Source 06/30/16 1610 Oral     SpO2 06/30/16 1610 93 %     Weight 06/30/16 1613 250 lb (113.4 kg)     Height 06/30/16 1613 5\' 5"  (1.651 m)     Head Circumference --      Peak Flow --      Pain Score 06/30/16 1614 3     Pain Loc --      Pain Edu? --  Excl. in GC? --    No data found.   Updated Vital Signs BP (!) 137/113 (BP Location: Left Arm)   Pulse (!) 109   Temp 99 F (37.2 C) (Oral)   Resp 16   Ht 5\' 5"  (5.732 m)   Wt 250 lb (113.4 kg)   SpO2 93%   BMI 41.60 kg/m   Visual Acuity Right Eye Distance:   Left Eye Distance:   Bilateral Distance:    Right Eye Near:   Left Eye Near:    Bilateral Near:     Physical Exam  Constitutional: She is oriented to person, place, and time. She appears well-nourished.  HENT:  Head: Normocephalic and atraumatic.  Right Ear: External ear normal.  Left Ear: External ear normal.  Eyes: Pupils are equal, round, and reactive to light.  Neck: Normal range of motion. Neck supple.  Pulmonary/Chest: Effort normal.  Abdominal: Soft. There is tenderness.  Musculoskeletal: Normal range of motion. She exhibits no edema or tenderness.  Neurological: She is alert and oriented to person, place, and time.  Skin: Skin is warm.  Psychiatric: She has a normal mood and affect.  Vitals reviewed.    UC Treatments / Results  Labs (all labs ordered are listed, but only abnormal results are displayed) Labs Reviewed  URINALYSIS, COMPLETE (UACMP) WITH MICROSCOPIC - Abnormal; Notable for the following:       Result Value   Leukocytes, UA TRACE (*)    Squamous Epithelial / LPF 0-5 (*)    All other components within normal limits  URINE CULTURE    EKG  EKG Interpretation None       Radiology No results  found.  Procedures Procedures (including critical care time)  Medications Ordered in UC Medications - No data to display Results for orders placed or performed during the hospital encounter of 06/30/16  Urinalysis, Complete w Microscopic  Result Value Ref Range   Color, Urine YELLOW YELLOW   APPearance CLEAR CLEAR   Specific Gravity, Urine 1.010 1.005 - 1.030   pH 5.5 5.0 - 8.0   Glucose, UA NEGATIVE NEGATIVE mg/dL   Hgb urine dipstick NEGATIVE NEGATIVE   Bilirubin Urine NEGATIVE NEGATIVE   Ketones, ur NEGATIVE NEGATIVE mg/dL   Protein, ur NEGATIVE NEGATIVE mg/dL   Nitrite NEGATIVE NEGATIVE   Leukocytes, UA TRACE (A) NEGATIVE   Squamous Epithelial / LPF 0-5 (A) NONE SEEN   WBC, UA 0-5 0 - 5 WBC/hpf   RBC / HPF NONE SEEN 0 - 5 RBC/hpf   Bacteria, UA NONE SEEN NONE SEEN   Urine-Other LESS THAN 10 mL OF URINE SUBMITTED     Initial Impression / Assessment and Plan / UC Course  I have reviewed the triage vital signs and the nursing notes.  Pertinent labs & imaging results that were available during my care of the patient were reviewed by me and considered in my medical decision making (see chart for details).     His urine just shows a little bit of leukocytes but may just be because OF started this morning. We'll place on Macrobid for 1 week 1 capsule twice a day will also place her on Pyridium for the next 5 days will culture urine to make sure this is truly UTI. Follow-up for PCP as needed not better in 1-2 weeks  Final Clinical Impressions(s) / UC Diagnoses   Final diagnoses:  Lower urinary tract infectious disease  Dysuria    New Prescriptions New Prescriptions   NITROFURANTOIN, MACROCRYSTAL-MONOHYDRATE, (  MACROBID) 100 MG CAPSULE    Take 1 capsule (100 mg total) by mouth 2 (two) times daily.   PHENAZOPYRIDINE (PYRIDIUM) 200 MG TABLET    Take 1 tablet (200 mg total) by mouth 3 (three) times daily as needed for pain.   Note: This dictation was prepared with Dragon  dictation along with smaller phrase technology. Any transcriptional errors that result from this process are unintentional.   Frederich Cha, MD 06/30/16 1654

## 2016-07-02 ENCOUNTER — Inpatient Hospital Stay
Admission: EM | Admit: 2016-07-02 | Discharge: 2016-07-24 | DRG: 870 | Disposition: A | Discharge status: Skilled Nursing Facility | Payer: Medicare HMO | Attending: Internal Medicine | Admitting: Internal Medicine

## 2016-07-02 ENCOUNTER — Emergency Department: Payer: Medicare HMO

## 2016-07-02 DIAGNOSIS — N170 Acute kidney failure with tubular necrosis: Secondary | ICD-10-CM | POA: Diagnosis not present

## 2016-07-02 DIAGNOSIS — I515 Myocardial degeneration: Secondary | ICD-10-CM | POA: Diagnosis not present

## 2016-07-02 DIAGNOSIS — I2510 Atherosclerotic heart disease of native coronary artery without angina pectoris: Secondary | ICD-10-CM | POA: Diagnosis not present

## 2016-07-02 DIAGNOSIS — J9801 Acute bronchospasm: Secondary | ICD-10-CM | POA: Diagnosis not present

## 2016-07-02 DIAGNOSIS — J9600 Acute respiratory failure, unspecified whether with hypoxia or hypercapnia: Secondary | ICD-10-CM | POA: Diagnosis present

## 2016-07-02 DIAGNOSIS — Z6841 Body Mass Index (BMI) 40.0 and over, adult: Secondary | ICD-10-CM

## 2016-07-02 DIAGNOSIS — R0902 Hypoxemia: Secondary | ICD-10-CM | POA: Diagnosis not present

## 2016-07-02 DIAGNOSIS — Z7189 Other specified counseling: Secondary | ICD-10-CM

## 2016-07-02 DIAGNOSIS — J9690 Respiratory failure, unspecified, unspecified whether with hypoxia or hypercapnia: Secondary | ICD-10-CM

## 2016-07-02 DIAGNOSIS — R410 Disorientation, unspecified: Secondary | ICD-10-CM | POA: Diagnosis not present

## 2016-07-02 DIAGNOSIS — I517 Cardiomegaly: Secondary | ICD-10-CM | POA: Diagnosis not present

## 2016-07-02 DIAGNOSIS — I5033 Acute on chronic diastolic (congestive) heart failure: Secondary | ICD-10-CM | POA: Diagnosis not present

## 2016-07-02 DIAGNOSIS — R918 Other nonspecific abnormal finding of lung field: Secondary | ICD-10-CM | POA: Diagnosis not present

## 2016-07-02 DIAGNOSIS — R498 Other voice and resonance disorders: Secondary | ICD-10-CM | POA: Diagnosis not present

## 2016-07-02 DIAGNOSIS — R29898 Other symptoms and signs involving the musculoskeletal system: Secondary | ICD-10-CM | POA: Diagnosis not present

## 2016-07-02 DIAGNOSIS — Z515 Encounter for palliative care: Secondary | ICD-10-CM | POA: Diagnosis not present

## 2016-07-02 DIAGNOSIS — J440 Chronic obstructive pulmonary disease with acute lower respiratory infection: Secondary | ICD-10-CM | POA: Diagnosis present

## 2016-07-02 DIAGNOSIS — I10 Essential (primary) hypertension: Secondary | ICD-10-CM | POA: Diagnosis not present

## 2016-07-02 DIAGNOSIS — D696 Thrombocytopenia, unspecified: Secondary | ICD-10-CM | POA: Diagnosis not present

## 2016-07-02 DIAGNOSIS — J449 Chronic obstructive pulmonary disease, unspecified: Secondary | ICD-10-CM | POA: Diagnosis not present

## 2016-07-02 DIAGNOSIS — G9340 Encephalopathy, unspecified: Secondary | ICD-10-CM | POA: Diagnosis not present

## 2016-07-02 DIAGNOSIS — Z96612 Presence of left artificial shoulder joint: Secondary | ICD-10-CM | POA: Diagnosis present

## 2016-07-02 DIAGNOSIS — Z8249 Family history of ischemic heart disease and other diseases of the circulatory system: Secondary | ICD-10-CM

## 2016-07-02 DIAGNOSIS — Z833 Family history of diabetes mellitus: Secondary | ICD-10-CM

## 2016-07-02 DIAGNOSIS — Z4682 Encounter for fitting and adjustment of non-vascular catheter: Secondary | ICD-10-CM | POA: Diagnosis not present

## 2016-07-02 DIAGNOSIS — A419 Sepsis, unspecified organism: Secondary | ICD-10-CM | POA: Diagnosis not present

## 2016-07-02 DIAGNOSIS — J441 Chronic obstructive pulmonary disease with (acute) exacerbation: Secondary | ICD-10-CM | POA: Diagnosis present

## 2016-07-02 DIAGNOSIS — J189 Pneumonia, unspecified organism: Secondary | ICD-10-CM | POA: Diagnosis not present

## 2016-07-02 DIAGNOSIS — G2581 Restless legs syndrome: Secondary | ICD-10-CM | POA: Diagnosis present

## 2016-07-02 DIAGNOSIS — Z66 Do not resuscitate: Secondary | ICD-10-CM | POA: Diagnosis present

## 2016-07-02 DIAGNOSIS — R404 Transient alteration of awareness: Secondary | ICD-10-CM | POA: Diagnosis not present

## 2016-07-02 DIAGNOSIS — I110 Hypertensive heart disease with heart failure: Secondary | ICD-10-CM | POA: Diagnosis present

## 2016-07-02 DIAGNOSIS — E876 Hypokalemia: Secondary | ICD-10-CM | POA: Diagnosis not present

## 2016-07-02 DIAGNOSIS — I959 Hypotension, unspecified: Secondary | ICD-10-CM | POA: Diagnosis present

## 2016-07-02 DIAGNOSIS — J9622 Acute and chronic respiratory failure with hypercapnia: Secondary | ICD-10-CM

## 2016-07-02 DIAGNOSIS — R0689 Other abnormalities of breathing: Secondary | ICD-10-CM | POA: Diagnosis not present

## 2016-07-02 DIAGNOSIS — R509 Fever, unspecified: Secondary | ICD-10-CM | POA: Diagnosis not present

## 2016-07-02 DIAGNOSIS — R0600 Dyspnea, unspecified: Secondary | ICD-10-CM | POA: Diagnosis not present

## 2016-07-02 DIAGNOSIS — Z9981 Dependence on supplemental oxygen: Secondary | ICD-10-CM

## 2016-07-02 DIAGNOSIS — E872 Acidosis: Secondary | ICD-10-CM | POA: Diagnosis not present

## 2016-07-02 DIAGNOSIS — J9601 Acute respiratory failure with hypoxia: Secondary | ICD-10-CM | POA: Diagnosis present

## 2016-07-02 DIAGNOSIS — A499 Bacterial infection, unspecified: Secondary | ICD-10-CM | POA: Diagnosis not present

## 2016-07-02 DIAGNOSIS — A403 Sepsis due to Streptococcus pneumoniae: Secondary | ICD-10-CM | POA: Diagnosis not present

## 2016-07-02 DIAGNOSIS — J9621 Acute and chronic respiratory failure with hypoxia: Secondary | ICD-10-CM | POA: Diagnosis not present

## 2016-07-02 DIAGNOSIS — Z7401 Bed confinement status: Secondary | ICD-10-CM | POA: Diagnosis not present

## 2016-07-02 DIAGNOSIS — E039 Hypothyroidism, unspecified: Secondary | ICD-10-CM | POA: Diagnosis present

## 2016-07-02 DIAGNOSIS — Z1623 Resistance to quinolones and fluoroquinolones: Secondary | ICD-10-CM | POA: Diagnosis present

## 2016-07-02 DIAGNOSIS — R1312 Dysphagia, oropharyngeal phase: Secondary | ICD-10-CM | POA: Diagnosis not present

## 2016-07-02 DIAGNOSIS — L8990 Pressure ulcer of unspecified site, unspecified stage: Secondary | ICD-10-CM | POA: Insufficient documentation

## 2016-07-02 DIAGNOSIS — Z87891 Personal history of nicotine dependence: Secondary | ICD-10-CM | POA: Diagnosis not present

## 2016-07-02 DIAGNOSIS — Z981 Arthrodesis status: Secondary | ICD-10-CM

## 2016-07-02 DIAGNOSIS — G4733 Obstructive sleep apnea (adult) (pediatric): Secondary | ICD-10-CM | POA: Diagnosis present

## 2016-07-02 DIAGNOSIS — R531 Weakness: Secondary | ICD-10-CM | POA: Diagnosis not present

## 2016-07-02 DIAGNOSIS — E874 Mixed disorder of acid-base balance: Secondary | ICD-10-CM | POA: Diagnosis not present

## 2016-07-02 DIAGNOSIS — Z4659 Encounter for fitting and adjustment of other gastrointestinal appliance and device: Secondary | ICD-10-CM

## 2016-07-02 DIAGNOSIS — Z9911 Dependence on respirator [ventilator] status: Secondary | ICD-10-CM | POA: Diagnosis not present

## 2016-07-02 DIAGNOSIS — R739 Hyperglycemia, unspecified: Secondary | ICD-10-CM | POA: Diagnosis not present

## 2016-07-02 DIAGNOSIS — J90 Pleural effusion, not elsewhere classified: Secondary | ICD-10-CM | POA: Diagnosis not present

## 2016-07-02 DIAGNOSIS — E873 Alkalosis: Secondary | ICD-10-CM | POA: Diagnosis not present

## 2016-07-02 DIAGNOSIS — M6281 Muscle weakness (generalized): Secondary | ICD-10-CM | POA: Diagnosis not present

## 2016-07-02 DIAGNOSIS — L570 Actinic keratosis: Secondary | ICD-10-CM | POA: Diagnosis not present

## 2016-07-02 DIAGNOSIS — I509 Heart failure, unspecified: Secondary | ICD-10-CM | POA: Diagnosis not present

## 2016-07-02 DIAGNOSIS — J13 Pneumonia due to Streptococcus pneumoniae: Secondary | ICD-10-CM | POA: Diagnosis not present

## 2016-07-02 DIAGNOSIS — J9602 Acute respiratory failure with hypercapnia: Secondary | ICD-10-CM | POA: Diagnosis not present

## 2016-07-02 DIAGNOSIS — R6521 Severe sepsis with septic shock: Secondary | ICD-10-CM | POA: Diagnosis not present

## 2016-07-02 DIAGNOSIS — N179 Acute kidney failure, unspecified: Secondary | ICD-10-CM | POA: Diagnosis not present

## 2016-07-02 DIAGNOSIS — Z96698 Presence of other orthopedic joint implants: Secondary | ICD-10-CM | POA: Diagnosis present

## 2016-07-02 DIAGNOSIS — R0602 Shortness of breath: Secondary | ICD-10-CM | POA: Diagnosis not present

## 2016-07-02 HISTORY — DX: Chronic obstructive pulmonary disease, unspecified: J44.9

## 2016-07-02 LAB — INFLUENZA PANEL BY PCR (TYPE A & B)
INFLBPCR: NEGATIVE
Influenza A By PCR: NEGATIVE

## 2016-07-02 LAB — URINE CULTURE: Special Requests: NORMAL

## 2016-07-02 LAB — COMPREHENSIVE METABOLIC PANEL
ALBUMIN: 3.1 g/dL — AB (ref 3.5–5.0)
ALK PHOS: 108 U/L (ref 38–126)
ALT: 14 U/L (ref 14–54)
AST: 25 U/L (ref 15–41)
Anion gap: 9 (ref 5–15)
BILIRUBIN TOTAL: 1 mg/dL (ref 0.3–1.2)
BUN: 58 mg/dL — AB (ref 6–20)
CALCIUM: 8.1 mg/dL — AB (ref 8.9–10.3)
CO2: 28 mmol/L (ref 22–32)
CREATININE: 2.92 mg/dL — AB (ref 0.44–1.00)
Chloride: 97 mmol/L — ABNORMAL LOW (ref 101–111)
GFR calc Af Amer: 18 mL/min — ABNORMAL LOW (ref >60)
GFR, EST NON AFRICAN AMERICAN: 15 mL/min — AB (ref >60)
GLUCOSE: 95 mg/dL (ref 65–99)
POTASSIUM: 4.4 mmol/L (ref 3.5–5.1)
Sodium: 134 mmol/L — ABNORMAL LOW (ref 135–145)
TOTAL PROTEIN: 5.8 g/dL — AB (ref 6.5–8.1)

## 2016-07-02 LAB — CBC
HCT: 43.4 % (ref 35.0–47.0)
Hemoglobin: 13.7 g/dL (ref 12.0–16.0)
MCH: 30.3 pg (ref 26.0–34.0)
MCHC: 31.6 g/dL — AB (ref 32.0–36.0)
MCV: 95.8 fL (ref 80.0–100.0)
PLATELETS: 165 10*3/uL (ref 150–440)
RBC: 4.53 MIL/uL (ref 3.80–5.20)
RDW: 17.2 % — AB (ref 11.5–14.5)
WBC: 36.7 10*3/uL — AB (ref 3.6–11.0)

## 2016-07-02 LAB — URINALYSIS, ROUTINE W REFLEX MICROSCOPIC
BACTERIA UA: NONE SEEN
Bilirubin Urine: NEGATIVE
GLUCOSE, UA: NEGATIVE mg/dL
KETONES UR: NEGATIVE mg/dL
LEUKOCYTES UA: NEGATIVE
NITRITE: NEGATIVE
Protein, ur: 30 mg/dL — AB
SPECIFIC GRAVITY, URINE: 1.016 (ref 1.005–1.030)
pH: 5 (ref 5.0–8.0)

## 2016-07-02 LAB — PROCALCITONIN: PROCALCITONIN: 14.57 ng/mL

## 2016-07-02 LAB — TROPONIN I: Troponin I: 0.06 ng/mL (ref <0.03)

## 2016-07-02 LAB — MRSA PCR SCREENING: MRSA BY PCR: NEGATIVE

## 2016-07-02 LAB — GLUCOSE, CAPILLARY: Glucose-Capillary: 87 mg/dL (ref 65–99)

## 2016-07-02 LAB — STREP PNEUMONIAE URINARY ANTIGEN: Strep Pneumo Urinary Antigen: POSITIVE — AB

## 2016-07-02 LAB — LACTIC ACID, PLASMA
LACTIC ACID, VENOUS: 1.5 mmol/L (ref 0.5–1.9)
Lactic Acid, Venous: 1.5 mmol/L (ref 0.5–1.9)

## 2016-07-02 MED ORDER — LEVOTHYROXINE SODIUM 75 MCG PO TABS
200.0000 ug | ORAL_TABLET | Freq: Every day | ORAL | Status: DC
  Administered 2016-07-03 – 2016-07-13 (×11): 200 ug via ORAL
  Filled 2016-07-02: qty 1
  Filled 2016-07-02: qty 2
  Filled 2016-07-02: qty 1
  Filled 2016-07-02: qty 4
  Filled 2016-07-02 (×6): qty 1
  Filled 2016-07-02: qty 2

## 2016-07-02 MED ORDER — IPRATROPIUM-ALBUTEROL 0.5-2.5 (3) MG/3ML IN SOLN
3.0000 mL | Freq: Once | RESPIRATORY_TRACT | Status: AC
  Administered 2016-07-02: 3 mL via RESPIRATORY_TRACT
  Filled 2016-07-02: qty 3

## 2016-07-02 MED ORDER — ACETAMINOPHEN 500 MG PO TABS
ORAL_TABLET | ORAL | Status: AC
  Administered 2016-07-02: 1000 mg via ORAL
  Filled 2016-07-02: qty 2

## 2016-07-02 MED ORDER — VANCOMYCIN HCL IN DEXTROSE 1-5 GM/200ML-% IV SOLN
1000.0000 mg | Freq: Once | INTRAVENOUS | Status: AC
  Administered 2016-07-02: 1000 mg via INTRAVENOUS
  Filled 2016-07-02: qty 200

## 2016-07-02 MED ORDER — SODIUM CHLORIDE 0.9 % IV BOLUS (SEPSIS)
1000.0000 mL | Freq: Once | INTRAVENOUS | Status: AC
  Administered 2016-07-02: 1000 mL via INTRAVENOUS

## 2016-07-02 MED ORDER — HEPARIN SODIUM (PORCINE) 5000 UNIT/ML IJ SOLN
5000.0000 [IU] | Freq: Three times a day (TID) | INTRAMUSCULAR | Status: DC
  Administered 2016-07-02 – 2016-07-07 (×15): 5000 [IU] via SUBCUTANEOUS
  Filled 2016-07-02 (×14): qty 1

## 2016-07-02 MED ORDER — ROFLUMILAST 500 MCG PO TABS
500.0000 ug | ORAL_TABLET | Freq: Every day | ORAL | Status: DC
Start: 2016-07-02 — End: 2016-07-02

## 2016-07-02 MED ORDER — ASPIRIN EC 81 MG PO TBEC
81.0000 mg | DELAYED_RELEASE_TABLET | Freq: Every day | ORAL | Status: DC
  Administered 2016-07-03 – 2016-07-11 (×8): 81 mg via ORAL
  Filled 2016-07-02 (×10): qty 1

## 2016-07-02 MED ORDER — FUROSEMIDE 40 MG PO TABS: 40.0000 mg | ORAL_TABLET | Freq: Every day | ORAL | Status: DC

## 2016-07-02 MED ORDER — ACETAMINOPHEN 325 MG PO TABS
650.0000 mg | ORAL_TABLET | Freq: Four times a day (QID) | ORAL | Status: DC | PRN
  Administered 2016-07-04 – 2016-07-06 (×2): 650 mg via ORAL
  Filled 2016-07-02 (×2): qty 2

## 2016-07-02 MED ORDER — SODIUM CHLORIDE 0.9 % IV SOLN
INTRAVENOUS | Status: DC
  Administered 2016-07-02: 13:00:00 via INTRAVENOUS

## 2016-07-02 MED ORDER — PRAMIPEXOLE DIHYDROCHLORIDE 1.5 MG PO TABS
4.0000 mg | ORAL_TABLET | Freq: Every day | ORAL | Status: DC
  Filled 2016-07-02: qty 1

## 2016-07-02 MED ORDER — ACETAMINOPHEN 650 MG RE SUPP: 650.0000 mg | Freq: Four times a day (QID) | RECTAL | Status: DC | PRN

## 2016-07-02 MED ORDER — BUDESONIDE 0.25 MG/2ML IN SUSP
0.2500 mg | Freq: Four times a day (QID) | RESPIRATORY_TRACT | Status: DC
  Administered 2016-07-02 – 2016-07-03 (×3): 0.25 mg via RESPIRATORY_TRACT
  Filled 2016-07-02 (×2): qty 2

## 2016-07-02 MED ORDER — IPRATROPIUM-ALBUTEROL 0.5-2.5 (3) MG/3ML IN SOLN: 3.0000 mL | RESPIRATORY_TRACT | Status: DC

## 2016-07-02 MED ORDER — PIPERACILLIN-TAZOBACTAM 3.375 G IVPB 30 MIN
3.3750 g | Freq: Once | INTRAVENOUS | Status: AC
  Administered 2016-07-02: 3.375 g via INTRAVENOUS
  Filled 2016-07-02: qty 50

## 2016-07-02 MED ORDER — VANCOMYCIN HCL 500 MG IV SOLR
500.0000 mg | Freq: Once | INTRAVENOUS | Status: AC
  Administered 2016-07-02: 500 mg via INTRAVENOUS
  Filled 2016-07-02 (×3): qty 500

## 2016-07-02 MED ORDER — IPRATROPIUM-ALBUTEROL 0.5-2.5 (3) MG/3ML IN SOLN
3.0000 mL | Freq: Four times a day (QID) | RESPIRATORY_TRACT | Status: DC
  Administered 2016-07-02 – 2016-07-03 (×3): 3 mL via RESPIRATORY_TRACT
  Filled 2016-07-02 (×3): qty 3

## 2016-07-02 MED ORDER — DEXTROSE 5 % IV SOLN
500.0000 mg | Freq: Every day | INTRAVENOUS | Status: DC
  Administered 2016-07-02 – 2016-07-03 (×2): 500 mg via INTRAVENOUS
  Filled 2016-07-02 (×3): qty 500

## 2016-07-02 MED ORDER — CHLORHEXIDINE GLUCONATE 0.12 % MT SOLN
15.0000 mL | Freq: Two times a day (BID) | OROMUCOSAL | Status: DC
  Administered 2016-07-02 – 2016-07-17 (×22): 15 mL via OROMUCOSAL
  Filled 2016-07-02 (×9): qty 15

## 2016-07-02 MED ORDER — PRAMIPEXOLE DIHYDROCHLORIDE 1 MG PO TABS
4.0000 mg | ORAL_TABLET | Freq: Every day | ORAL | Status: DC
  Administered 2016-07-03 – 2016-07-12 (×11): 4 mg via ORAL
  Filled 2016-07-02 (×12): qty 4

## 2016-07-02 MED ORDER — LACTATED RINGERS IV SOLN
INTRAVENOUS | Status: DC
  Administered 2016-07-02: 15:00:00 via INTRAVENOUS

## 2016-07-02 MED ORDER — BUDESONIDE 0.25 MG/2ML IN SUSP
0.2500 mg | Freq: Four times a day (QID) | RESPIRATORY_TRACT | Status: DC
  Filled 2016-07-02: qty 2

## 2016-07-02 MED ORDER — FLUTICASONE FUROATE-VILANTEROL 100-25 MCG/INH IN AEPB
1.0000 | INHALATION_SPRAY | Freq: Every day | RESPIRATORY_TRACT | Status: DC
  Administered 2016-07-02: 1 via RESPIRATORY_TRACT
  Filled 2016-07-02: qty 28

## 2016-07-02 MED ORDER — METHYLPREDNISOLONE SODIUM SUCC 40 MG IJ SOLR: 40.0000 mg | Freq: Every day | INTRAMUSCULAR | Status: DC

## 2016-07-02 MED ORDER — SODIUM CHLORIDE 0.9 % IV BOLUS (SEPSIS)
250.0000 mL | Freq: Once | INTRAVENOUS | Status: AC
  Administered 2016-07-02: 250 mL via INTRAVENOUS

## 2016-07-02 MED ORDER — DEXTROSE 5 % IV SOLN
2.0000 g | INTRAVENOUS | Status: DC
  Administered 2016-07-02 – 2016-07-03 (×2): 2 g via INTRAVENOUS
  Filled 2016-07-02 (×3): qty 2

## 2016-07-02 MED ORDER — CEFEPIME-DEXTROSE 1 GM/50ML IV SOLR: 1.0000 g | INTRAVENOUS | Status: DC

## 2016-07-02 MED ORDER — PHENYLEPHRINE HCL 10 MG/ML IJ SOLN
0.0000 ug/min | INTRAMUSCULAR | Status: DC
  Administered 2016-07-02 – 2016-07-03 (×2): 20 ug/min via INTRAVENOUS
  Filled 2016-07-02 (×2): qty 1

## 2016-07-02 MED ORDER — METHYLPREDNISOLONE SODIUM SUCC 125 MG IJ SOLR
125.0000 mg | Freq: Once | INTRAMUSCULAR | Status: AC
  Administered 2016-07-02: 125 mg via INTRAVENOUS
  Filled 2016-07-02: qty 2

## 2016-07-02 MED ORDER — METOLAZONE 5 MG PO TABS: 5.0000 mg | ORAL_TABLET | Freq: Every day | ORAL | Status: DC

## 2016-07-02 MED ORDER — ORAL CARE MOUTH RINSE: 15.0000 mL | Freq: Two times a day (BID) | OROMUCOSAL | Status: DC

## 2016-07-02 MED ORDER — ACETAMINOPHEN 500 MG PO TABS
1000.0000 mg | ORAL_TABLET | Freq: Once | ORAL | Status: AC
  Administered 2016-07-02: 1000 mg via ORAL

## 2016-07-02 NOTE — Consult Note (Signed)
PULMONARY / CRITICAL CARE MEDICINE   Name: Hannah Powell MRN: [id number] DOB: 10/12/1945    ADMISSION DATE:  07/02/2016 CONSULTATION DATE: 07/02/16  REFERRING MD: Earleen Newport  PT PROFILE: 71 y.o. F former smoker pt of [facility] with moderate to severe COPD admitted via ED with acute on chronic hypoxemic/hypercarbic respiratory failure due to pneumonia. She is DNR. Supported on BiPAP  HISTORY OF PRESENT ILLNESS:   As above. Patient is lethargic. Level V caveat. I did speak with Dr. Mortimer Fries who reports to me that he spoke with her over the phone last week and she had a fever but was not in respiratory distress at that time  PAST MEDICAL HISTORY :  She  has a past medical history of Anxiety; Arthritis; CHF (congestive heart failure) ([place]); COPD (chronic obstructive pulmonary disease) ([place]); Hypertension; Hypothyroidism; RLS (restless legs syndrome); Shortness of breath; and Sleep apnea.  PAST SURGICAL HISTORY: She  has a past surgical history that includes Back surgery; Joint replacement; Foot arthroplasty; Total shoulder arthroplasty (Left, 07/02/2013); and Breast biopsy (Left).  Allergies  Allergen Reactions  . Lyrica [Pregabalin] Other (See Comments)    Reaction: made Restless Leg Syndrome worse     No current facility-administered medications on file prior to encounter.    Current Outpatient Prescriptions on File Prior to Encounter  Medication Sig  . aspirin EC 81 MG tablet Take 81 mg by mouth daily.  . B Complex-C (B-COMPLEX WITH VITAMIN C) tablet Take 1 tablet by mouth daily.  . Calcium Carbonate-Vitamin D (CALCIUM-VITAMIN D) 500-200 MG-UNIT per tablet Take 1 tablet by mouth daily.  . chlorpheniramine-HYDROcodone (TUSSIONEX PENNKINETIC ER) 10-8 MG/5ML SUER Take 5 mLs by mouth 2 (two) times daily. (Patient taking differently: Take 5 mLs by mouth 2 (two) times daily as needed. )  . doxycycline (VIBRA-TABS) 100 MG tablet Take 1 tablet (100 mg total) by mouth 2 (two) times daily.  .  fluticasone furoate-vilanterol (BREO ELLIPTA) 100-25 MCG/INH AEPB Inhale 1 puff into the lungs daily.  . furosemide (LASIX) 40 MG tablet Take 1 tablet (40 mg total) by mouth daily.  Marland Kitchen guaiFENesin (MUCINEX) 600 MG 12 hr tablet Take 1 tablet (600 mg total) by mouth 2 (two) times daily.  Marland Kitchen guaiFENesin-codeine 100-10 MG/5ML syrup Take 10 mLs by mouth every 4 (four) hours as needed for cough.  Marland Kitchen ipratropium-albuterol (DUONEB) 0.5-2.5 (3) MG/3ML SOLN Take 3 mLs by nebulization every 4 (four) hours. (Patient taking differently: Take 3 mLs by nebulization every 4 (four) hours as needed. )  . levothyroxine (SYNTHROID, LEVOTHROID) 200 MCG tablet Take 200 mcg by mouth daily before breakfast.  . losartan (COZAAR) 25 MG tablet Take 25 mg by mouth daily.  . metolazone (ZAROXOLYN) 5 MG tablet Take 5 mg by mouth daily.  . nitrofurantoin, macrocrystal-monohydrate, (MACROBID) 100 MG capsule Take 1 capsule (100 mg total) by mouth 2 (two) times daily.  . Omega-3 Fatty Acids (OMEGA 3 PO) Take 1 capsule by mouth daily.  . phenazopyridine (PYRIDIUM) 200 MG tablet Take 1 tablet (200 mg total) by mouth 3 (three) times daily as needed for pain.  . pramipexole (MIRAPEX) 1 MG tablet Take 4 mg by mouth at bedtime.   . roflumilast (DALIRESP) 500 MCG TABS tablet Take 1 tablet (500 mcg total) by mouth daily.  Marland Kitchen zolpidem (AMBIEN) 10 MG tablet Take 10 mg by mouth at bedtime.     FAMILY HISTORY:  Her indicated that her mother is deceased. She indicated that her father is deceased. She indicated that the  status of her neg hx is unknown.    SOCIAL HISTORY: She  reports that she quit smoking about 41 years ago. She has a 10.00 pack-year smoking history. She has never used smokeless tobacco. She reports that she drinks alcohol. She reports that she does not use drugs.  REVIEW OF SYSTEMS:   Level V caveat  SUBJECTIVE:    VITAL SIGNS: BP (!) 115/57   Pulse 86   Temp 98.2 F (36.8 C) (Axillary)   Resp 19   Ht 5\' 5"  (1.651  m)   Wt 113.4 kg (250 lb)   SpO2 96%   BMI 41.60 kg/m   HEMODYNAMICS:    VENTILATOR SETTINGS:    INTAKE / OUTPUT: No intake/output data recorded.  PHYSICAL EXAMINATION: General: Lethargic, BiPAP, no overt distress, following commands Neuro: No focal deficits, cranial nerves intact, moves all extremities HEENT: NCAT, sclerae white Cardiovascular: Regular, no murmurs Lungs: Extensive rhonchi on the right, no wheezes Abdomen: Obese, soft, diminished bowel sounds Extremities: Warm, no edema Skin: Limited exam, no lesions noted  LABS:  BMET  Recent Labs Lab 07/02/16 1127  NA 134*  K 4.4  CL 97*  CO2 28  BUN 58*  CREATININE 2.92*  GLUCOSE 95    Electrolytes  Recent Labs Lab 07/02/16 1127  CALCIUM 8.1*    CBC  Recent Labs Lab 07/02/16 1127  WBC 36.7*  HGB 13.7  HCT 43.4  PLT 165    Coag's No results for input(s): APTT, INR in the last 168 hours.  Sepsis Markers  Recent Labs Lab 07/02/16 1127  LATICACIDVEN 1.5  PROCALCITON 14.57    ABG No results for input(s): PHART, PCO2ART, PO2ART in the last 168 hours.  Liver Enzymes  Recent Labs Lab 07/02/16 1127  AST 25  ALT 14  ALKPHOS 108  BILITOT 1.0  ALBUMIN 3.1*    Cardiac Enzymes  Recent Labs Lab 07/02/16 1127  TROPONINI 0.06*    Glucose  Recent Labs Lab 07/02/16 1505  GLUCAP 87    CXR: Extensive infiltrate involving all lobes on the right   ASSESSMENT / PLAN: Acute on chronic hypoxemic/hypercarbic respiratory failure Moderate to severe COPD Extensive right-sided pneumonia Acute kidney injury DNR  Agree with ICU/stepdown status Agree with current antibiotics-vancomycin, cefepime, azithromycin BiPAP as needed Nebulized steroids and bronchodilators Systemic steroids DNR status seems appropriate I have ordered strep and Legionella urinary antigens Pro-calcitonin protocol ordered I have adjusted IV fluids to minimize risk of worsening renal failure  Merton Border, MD PCCM service Mobile [phone] Pager [phone]  07/02/2016, 4:28 PM

## 2016-07-02 NOTE — Progress Notes (Signed)
Pharmacy Antibiotic Note  Hannah Powell is a 71 y.o. female admitted on 07/02/2016 with sepsis.  Pharmacy has been consulted for vancomycin and cefepime dosing.  Patient is in AKI. Vancomycin 1000 mg and piperacillin/tazobactam dose already given.  Plan: Will order another vancomycin 500 mg dose to be given now for a total loading dose of 1500 mg (~20 mg/kg adjusted body weight). As patient is in AKI, will need to dose off of random levels. Vancomycin level ordered for 24 hours post-dose. Goal vancomycin level 15-20 mcg/mL  Order cefepime 2 g IV daily per renal function and indication.   Height: 5\' 5"  (165.1 cm) Weight: 250 lb (113.4 kg) IBW/kg (Calculated) : 57  Temp (24hrs), Avg:98.2 F (36.8 C), Min:98.2 F (36.8 C), Max:98.2 F (36.8 C)   Recent Labs Lab 07/02/16 1127  WBC 36.7*  CREATININE 2.92*  LATICACIDVEN 1.5    Estimated Creatinine Clearance: 22.8 mL/min (A) (by C-G formula based on SCr of 2.92 mg/dL (H)).    Allergies  Allergen Reactions  . Lyrica [Pregabalin] Other (See Comments)    Reaction: made Restless Leg Syndrome worse     Thank you for allowing pharmacy to be a part of this patient's care.  Lenis Noon, PharmD Clinical Pharmacist 07/02/2016 1:37 PM

## 2016-07-02 NOTE — ED Triage Notes (Signed)
Pt presents to ED via [place] emergency traffic from home for respiratory distress. Pt is altered, will act like she's asleep when oxygen drops. Pt is breathing fast. 58% RA with FD, 6 L nasal cannula with EMS brought her up to 78% with BP of 82/60. Pt was traveling in [place] few weeks ago, self diagnosed with allergies and took a steroid. Arrived home few days ago and went to a walk in clinic and dx with borderline UTI, taking macrobid. Pt appears very tired, eye lids hanging low. Pt cap refil takes a while in fingers. Ears purple. Pt used to be a [employer] scientist. When she appears alert and oriented she answers questions, when oxygen drops she is hard to arouse. Pt has hx CHF. Pt arrives with 18 G R AC. Pt has a cough, lung sounds diminished. Pt is breathing fast and shallow. EMS gave 1 albuterol, CBG 114.

## 2016-07-02 NOTE — ED Notes (Signed)
Pt states she wears CPAP at home, told EMS she used it last night, told respiratory she didn't use it last night.

## 2016-07-02 NOTE — ED Provider Notes (Signed)
[facility] Emergency Department Provider Note   ____________________________________________    I have reviewed the triage vital signs and the nursing notes.   HISTORY  Chief Complaint Respiratory Distress   History limited by shortness of breath  HPI Hannah Powell is a 71 y.o. female who presents via EMS with shortness of breath.Patient poorly has a history of CHF and COPD. Patient feels this is likely related to COPD. She denies fevers to me. She denies chest pain. She is on 3 L of home O2.   Past Medical History:  Diagnosis Date  . Anxiety   . Arthritis   . CHF (congestive heart failure) ([place])   . Hypertension    dr Otho Najjar     . Hypothyroidism   . RLS (restless legs syndrome)   . Shortness of breath   . Sleep apnea    cpap      >5 yrs    Patient Active Problem List   Diagnosis Date Noted  . Acute respiratory failure with hypoxia ([place]) 07/02/2016  . Acute on chronic diastolic CHF (congestive heart failure) ([place]) 07/21/2015  . COPD exacerbation ([place]) 07/21/2015  . Acute renal insufficiency 07/21/2015  . Hyperglycemia 07/21/2015  . Benign essential HTN 07/21/2015  . Acute on chronic respiratory failure with hypoxia and hypercapnia ([place]) 07/18/2015  . S/P shoulder replacement 07/02/2013    Past Surgical History:  Procedure Laterality Date  . BACK SURGERY     cervical  . BREAST BIOPSY Left    needle bx-neg  . FOOT ARTHROPLASTY    . JOINT REPLACEMENT     x2 tkr  . TOTAL SHOULDER ARTHROPLASTY Left 07/02/2013   Procedure: LEFT TOTAL SHOULDER ARTHROPLASTY;  Surgeon: Marin Shutter, MD;  Location: [place];  Service: Orthopedics;  Laterality: Left;    Prior to Admission medications   Medication Sig Start Date End Date Taking? Authorizing Provider  aspirin EC 81 MG tablet Take 81 mg by mouth daily.   Yes Historical Provider, MD  B Complex-C (B-COMPLEX WITH VITAMIN C) tablet Take 1 tablet by mouth daily.   Yes Historical  Provider, MD  Calcium Carbonate-Vitamin D (CALCIUM-VITAMIN D) 500-200 MG-UNIT per tablet Take 1 tablet by mouth daily.   Yes Historical Provider, MD  chlorpheniramine-HYDROcodone (TUSSIONEX PENNKINETIC ER) 10-8 MG/5ML SUER Take 5 mLs by mouth 2 (two) times daily. Patient taking differently: Take 5 mLs by mouth 2 (two) times daily as needed.  07/21/15  Yes Theodoro Grist, MD  doxycycline (VIBRA-TABS) 100 MG tablet Take 1 tablet (100 mg total) by mouth 2 (two) times daily. 03/02/16  Yes Wilhelmina Mcardle, MD  fluticasone furoate-vilanterol (BREO ELLIPTA) 100-25 MCG/INH AEPB Inhale 1 puff into the lungs daily. 02/22/16  Yes Flora Lipps, MD  furosemide (LASIX) 40 MG tablet Take 1 tablet (40 mg total) by mouth daily. 07/21/15  Yes Theodoro Grist, MD  guaiFENesin (MUCINEX) 600 MG 12 hr tablet Take 1 tablet (600 mg total) by mouth 2 (two) times daily. 07/21/15  Yes Theodoro Grist, MD  guaiFENesin-codeine 100-10 MG/5ML syrup Take 10 mLs by mouth every 4 (four) hours as needed for cough. 07/21/15  Yes Theodoro Grist, MD  ipratropium-albuterol (DUONEB) 0.5-2.5 (3) MG/3ML SOLN Take 3 mLs by nebulization every 4 (four) hours. Patient taking differently: Take 3 mLs by nebulization every 4 (four) hours as needed.  07/21/15  Yes Theodoro Grist, MD  levothyroxine (SYNTHROID, LEVOTHROID) 200 MCG tablet Take 200 mcg by mouth daily before breakfast.   Yes Historical  Provider, MD  losartan (COZAAR) 25 MG tablet Take 25 mg by mouth daily.   Yes Historical Provider, MD  metolazone (ZAROXOLYN) 5 MG tablet Take 5 mg by mouth daily. 01/20/16 01/19/17 Yes Historical Provider, MD  nitrofurantoin, macrocrystal-monohydrate, (MACROBID) 100 MG capsule Take 1 capsule (100 mg total) by mouth 2 (two) times daily. 06/30/16  Yes Frederich Cha, MD  Omega-3 Fatty Acids (OMEGA 3 PO) Take 1 capsule by mouth daily.   Yes Historical Provider, MD  phenazopyridine (PYRIDIUM) 200 MG tablet Take 1 tablet (200 mg total) by mouth 3 (three) times daily as needed for  pain. 06/30/16  Yes Frederich Cha, MD  pramipexole (MIRAPEX) 1 MG tablet Take 4 mg by mouth at bedtime.    Yes Historical Provider, MD  roflumilast (DALIRESP) 500 MCG TABS tablet Take 1 tablet (500 mcg total) by mouth daily. 02/16/16  Yes Flora Lipps, MD  zolpidem (AMBIEN) 10 MG tablet Take 10 mg by mouth at bedtime.  12/30/15  Yes Historical Provider, MD     Allergies Lyrica [pregabalin]  Family History  Problem Relation Age of Onset  . Breast cancer Neg Hx     Social History Social History  Substance Use Topics  . Smoking status: Former Smoker    Packs/day: 1.00    Years: 10.00    Quit date: 03/20/1975  . Smokeless tobacco: Never Used  . Alcohol use Yes     Comment: occ    Level V caveat: Unable to obtain full Review of Systems due to patient distress  Constitutional: No fever/chills  ENT: No swelling Cardiovascular: Denies chest pain. Respiratory: Denies shortness of breath. Gastrointestinal: No abdominal pain.      Musculoskeletal: Negative for back pain.  Neurological: Negative for headaches  10-point ROS otherwise negative.  ____________________________________________   PHYSICAL EXAM:  VITAL SIGNS: ED Triage Vitals  Enc Vitals Group     BP 07/02/16 1130 116/88     Pulse Rate 07/02/16 1130 93     Resp 07/02/16 1130 19     Temp 07/02/16 1130 98.2 F (36.8 C)     Temp Source 07/02/16 1130 Axillary     SpO2 07/02/16 1130 (!) 84 %     Weight 07/02/16 1225 250 lb (113.4 kg)     Height 07/02/16 1225 5\' 5"  (1.651 m)     Head Circumference --      Peak Flow --      Pain Score 07/02/16 1126 6     Pain Loc --      Pain Edu? --      Excl. in [place]? --     Constitutional: AlertBut drowsy appearing. Obvious respiratory distress Eyes: Conjunctivae are normal.  Head: Atraumatic. Nose: No congestion/rhinnorhea. Mouth/Throat: Mucous membranes are moist.   Neck:  Painless ROM Cardiovascular: Tachycardia, regular rhythm. Grossly normal heart sounds.  Good peripheral  circulation. Respiratory: Wheezing diffusely, increased respiratory effort without tachypnea  bibasilar rales Gastrointestinal: Soft and nontender. No distention.  No CVA tenderness. Genitourinary: deferred Musculoskeletal: .  Warm and well perfused Neurologic:  . No gross focal neurologic deficits are appreciated.  Skin:  Skin is warm, dry and intact. No rash noted. Psychiatric: Mood and affect are normal. Speech and behavior are normal.  ____________________________________________   LABS (all labs ordered are listed, but only abnormal results are displayed)  Labs Reviewed  BLOOD GAS, VENOUS - Abnormal; Notable for the following:       Result Value   pH, Ven 7.15 (*)  pCO2, Ven 83 (*)    Bicarbonate 28.9 (*)    Acid-base deficit 2.3 (*)    All other components within normal limits  TROPONIN I - Abnormal; Notable for the following:    Troponin I 0.06 (*)    All other components within normal limits  COMPREHENSIVE METABOLIC PANEL - Abnormal; Notable for the following:    Sodium 134 (*)    Chloride 97 (*)    BUN 58 (*)    Creatinine, Ser 2.92 (*)    Calcium 8.1 (*)    Total Protein 5.8 (*)    Albumin 3.1 (*)    GFR calc non Af Amer 15 (*)    GFR calc Af Amer 18 (*)    All other components within normal limits  CBC - Abnormal; Notable for the following:    WBC 36.7 (*)    MCHC 31.6 (*)    RDW 17.2 (*)    All other components within normal limits  CULTURE, BLOOD (ROUTINE X 2)  CULTURE, BLOOD (ROUTINE X 2)  LACTIC ACID, PLASMA  LACTIC ACID, PLASMA  URINALYSIS, ROUTINE W REFLEX MICROSCOPIC   ____________________________________________  EKG  ED ECG REPORT I, Lavonia Drafts, the attending physician, personally viewed and interpreted this ECG.  Date: 07/02/2016   Rhythm: normal sinus rhythm QRS Axis: normal Intervals: normal ST/T Wave abnormalities: normal Conduction Disturbances: none   ____________________________________________  RADIOLOGY  Chest  x-ray shows probably lobar pneumonia on the right ____________________________________________   PROCEDURES  Procedure(s) performed: No    Critical Care performed: yes  CRITICAL CARE Performed by: Lavonia Drafts   Total critical care time:30 minutes  Critical care time was exclusive of separately billable procedures and treating other patients.  Critical care was necessary to treat or prevent imminent or life-threatening deterioration.  Critical care was time spent personally by me on the following activities: development of treatment plan with patient and/or surrogate as well as nursing, discussions with consultants, evaluation of patient's response to treatment, examination of patient, obtaining history from patient or surrogate, ordering and performing treatments and interventions, ordering and review of laboratory studies, ordering and review of radiographic studies, pulse oximetry and re-evaluation of patient's condition.  ____________________________________________   INITIAL IMPRESSION / ASSESSMENT AND PLAN / ED COURSE  Pertinent labs & imaging results that were available during my care of the patient were reviewed by me and considered in my medical decision making (see chart for details).  Patient presents with severe shortness of breath. Differential includes COPD exacerbation/CHF, less likely pneumonia. She appears drowsy, suspect elevated CO2. BiPAP ordered on arrival. Venous blood gas confirms elevated CO2.Marland Kitchen CBC demonstrates extremity high white blood cell count, given chest x-ray code sepsis called broad-spectrum antibiotics ordered.    ____________________________________________   FINAL CLINICAL IMPRESSION(S) / ED DIAGNOSES  Final diagnoses:  Acute respiratory failure with hypoxia and hypercapnia (HCC)  Community acquired pneumonia of right lung, unspecified part of lung  COPD exacerbation ([place])      NEW MEDICATIONS STARTED DURING THIS VISIT:  New  Prescriptions   No medications on file     Note:  This document was prepared using Dragon voice recognition software and may include unintentional dictation errors.    Lavonia Drafts, MD 07/02/16 1245

## 2016-07-02 NOTE — H&P (Signed)
[facility] NAME: Hannah Powell    MR#:  [id number]  DATE OF BIRTH:  Aug 05, 1945  DATE OF ADMISSION:  07/02/2016  PRIMARY CARE PHYSICIAN: Adin Hector, MD   REQUESTING/REFERRING PHYSICIAN: Dr Lavonia Drafts  CHIEF COMPLAINT:   Chief Complaint  Patient presents with  . Respiratory Distress    HISTORY OF PRESENT ILLNESS:  Hannah Powell  is a 71 y.o. female presented with respiratory distress. She stated on Thursday she started feeling bad. She needed to increase her oxygen from 2 L to 3 L. On Saturday she had shaking chills and fever. She spoke with Dr. Christel Mormon told her to go to urgent care. At the urgent care she was treated for UTI and given Bactrim. On Sunday she still had chills and a fever. She wanted to wait till today to call Dr. Caryl Comes for an appointment but she had altered mental status and family brought her in. She complains of shortness of breath, coughing and wheezing and bringing up grayish phlegm. In the ER she was in respiratory distress and placed on BiPAP. She was found to have a large right-sided pneumonia.  PAST MEDICAL HISTORY:   Past Medical History:  Diagnosis Date  . Anxiety   . Arthritis   . CHF (congestive heart failure) ([place])   . COPD (chronic obstructive pulmonary disease) ([place])   . Hypertension    dr Otho Najjar     . Hypothyroidism   . RLS (restless legs syndrome)   . Shortness of breath   . Sleep apnea    cpap      >5 yrs    PAST SURGICAL HISTORY:   Past Surgical History:  Procedure Laterality Date  . BACK SURGERY     cervical  . BREAST BIOPSY Left    needle bx-neg  . FOOT ARTHROPLASTY    . JOINT REPLACEMENT     x2 tkr  . TOTAL SHOULDER ARTHROPLASTY Left 07/02/2013   Procedure: LEFT TOTAL SHOULDER ARTHROPLASTY;  Surgeon: Marin Shutter, MD;  Location: [facility];  Service: Orthopedics;  Laterality: Left;    SOCIAL HISTORY:   Social History  Substance Use Topics  . Smoking status:  Former Smoker    Packs/day: 1.00    Years: 10.00    Quit date: 03/20/1975  . Smokeless tobacco: Never Used  . Alcohol use Yes     Comment: occ    FAMILY HISTORY:   Family History  Problem Relation Age of Onset  . Hypertension Mother   . Diabetes Mother   . Heart failure Father   . Breast cancer Neg Hx     DRUG ALLERGIES:   Allergies  Allergen Reactions  . Lyrica [Pregabalin] Other (See Comments)    Reaction: made Restless Leg Syndrome worse     REVIEW OF SYSTEMS:  CONSTITUTIONAL: Positive for fever, chills and sweats and fatigue and weakness.  EYES: No blurred or double vision. Wears glasses EARS, NOSE, AND THROAT: No tinnitus or ear pain. Positive for runny nose and sore throat. Decreased hearing RESPIRATORY: Positive for cough, shortness of breath, and wheezing. No hemoptysis.  CARDIOVASCULAR: No chest pain, orthopnea, edema.  GASTROINTESTINAL: No nausea, vomiting, diarrhea or abdominal pain. No blood in bowel movements GENITOURINARY: No dysuria, hematuria.  ENDOCRINE: No polyuria, nocturia,  HEMATOLOGY: No anemia, easy bruising or bleeding SKIN: No rash or lesion. MUSCULOSKELETAL: Positive for joint pain   NEUROLOGIC: No tingling, numbness, weakness.  PSYCHIATRY: No anxiety or depression.  MEDICATIONS AT HOME:   Prior to Admission medications   Medication Sig Start Date End Date Taking? Authorizing Provider  aspirin EC 81 MG tablet Take 81 mg by mouth daily.   Yes Historical Provider, MD  B Complex-C (B-COMPLEX WITH VITAMIN C) tablet Take 1 tablet by mouth daily.   Yes Historical Provider, MD  Calcium Carbonate-Vitamin D (CALCIUM-VITAMIN D) 500-200 MG-UNIT per tablet Take 1 tablet by mouth daily.   Yes Historical Provider, MD  chlorpheniramine-HYDROcodone (TUSSIONEX PENNKINETIC ER) 10-8 MG/5ML SUER Take 5 mLs by mouth 2 (two) times daily. Patient taking differently: Take 5 mLs by mouth 2 (two) times daily as needed.  07/21/15  Yes Theodoro Grist, MD  doxycycline  (VIBRA-TABS) 100 MG tablet Take 1 tablet (100 mg total) by mouth 2 (two) times daily. 03/02/16  Yes Wilhelmina Mcardle, MD  fluticasone furoate-vilanterol (BREO ELLIPTA) 100-25 MCG/INH AEPB Inhale 1 puff into the lungs daily. 02/22/16  Yes Flora Lipps, MD  furosemide (LASIX) 40 MG tablet Take 1 tablet (40 mg total) by mouth daily. 07/21/15  Yes Theodoro Grist, MD  guaiFENesin (MUCINEX) 600 MG 12 hr tablet Take 1 tablet (600 mg total) by mouth 2 (two) times daily. 07/21/15  Yes Theodoro Grist, MD  guaiFENesin-codeine 100-10 MG/5ML syrup Take 10 mLs by mouth every 4 (four) hours as needed for cough. 07/21/15  Yes Theodoro Grist, MD  ipratropium-albuterol (DUONEB) 0.5-2.5 (3) MG/3ML SOLN Take 3 mLs by nebulization every 4 (four) hours. Patient taking differently: Take 3 mLs by nebulization every 4 (four) hours as needed.  07/21/15  Yes Theodoro Grist, MD  levothyroxine (SYNTHROID, LEVOTHROID) 200 MCG tablet Take 200 mcg by mouth daily before breakfast.   Yes Historical Provider, MD  losartan (COZAAR) 25 MG tablet Take 25 mg by mouth daily.   Yes Historical Provider, MD  metolazone (ZAROXOLYN) 5 MG tablet Take 5 mg by mouth daily. 01/20/16 01/19/17 Yes Historical Provider, MD  nitrofurantoin, macrocrystal-monohydrate, (MACROBID) 100 MG capsule Take 1 capsule (100 mg total) by mouth 2 (two) times daily. 06/30/16  Yes Frederich Cha, MD  Omega-3 Fatty Acids (OMEGA 3 PO) Take 1 capsule by mouth daily.   Yes Historical Provider, MD  phenazopyridine (PYRIDIUM) 200 MG tablet Take 1 tablet (200 mg total) by mouth 3 (three) times daily as needed for pain. 06/30/16  Yes Frederich Cha, MD  pramipexole (MIRAPEX) 1 MG tablet Take 4 mg by mouth at bedtime.    Yes Historical Provider, MD  roflumilast (DALIRESP) 500 MCG TABS tablet Take 1 tablet (500 mcg total) by mouth daily. 02/16/16  Yes Flora Lipps, MD  zolpidem (AMBIEN) 10 MG tablet Take 10 mg by mouth at bedtime.  12/30/15  Yes Historical Provider, MD      VITAL SIGNS:  Blood  pressure (!) 89/64, pulse 87, temperature 98.2 F (36.8 C), temperature source Axillary, resp. rate 20, height 5\' 5"  (1.651 m), weight 113.4 kg (250 lb), SpO2 94 %.  PHYSICAL EXAMINATION:  GENERAL:  71 y.o.-year-old patient lying in the bed with acute Respiratory distress.  EYES: Pupils equal, round, reactive to light and accommodation. No scleral icterus. Extraocular muscles intact.  HEENT: Head atraumatic, normocephalic. Oropharynx and nasopharynx clear.  NECK:  Supple, no jugular venous distention. No thyroid enlargement, no tenderness.  LUNGS: Decreased breath sounds bilaterally, no wheezing or rales. Positive rhonchi right lung. No use of accessory muscles of respiration.  CARDIOVASCULAR: S1, S2 normal. No murmurs, rubs, or gallops.  ABDOMEN: Soft, nontender, nondistended. Bowel sounds present. No organomegaly or  mass.  EXTREMITIES: 2+ edema, no cyanosis, or clubbing.  NEUROLOGIC: Cranial nerves II through XII are intact. Muscle strength 5/5 in all extremities. Sensation intact. Gait not checked.  PSYCHIATRIC: The patient is alert and oriented x 3.  SKIN: Chronic lower extremity discoloration  LABORATORY PANEL:   CBC  Recent Labs Lab 07/02/16 1127  WBC 36.7*  HGB 13.7  HCT 43.4  PLT 165   ------------------------------------------------------------------------------------------------------------------  Chemistries   Recent Labs Lab 07/02/16 1127  NA 134*  K 4.4  CL 97*  CO2 28  GLUCOSE 95  BUN 58*  CREATININE 2.92*  CALCIUM 8.1*  AST 25  ALT 14  ALKPHOS 108  BILITOT 1.0   ------------------------------------------------------------------------------------------------------------------  Cardiac Enzymes  Recent Labs Lab 07/02/16 1127  TROPONINI 0.06*   ------------------------------------------------------------------------------------------------------------------  RADIOLOGY:  Dg Chest Portable 1 View  Result Date: 07/02/2016 CLINICAL DATA:  Shortness  of breath since this morning. EXAM: PORTABLE CHEST 1 VIEW COMPARISON:  Chest x-ray 07/19/2015 and report from prior chest x-ray 05/29/2016. FINDINGS: The heart is enlarged but stable. There is mild tortuosity and ectasia of the thoracic aorta. Fairly extensive opacification of the right hemithorax possibly a combination of polylobar pneumonia and pleural effusion. Left basilar atelectasis but no definite left lung infiltrates. The bony thorax is intact. Stable cervical fusion hardware and left shoulder prosthesis. IMPRESSION: Fairly extensive airspace opacification involving the right hemithorax. This is most likely polylobar pneumonia and pleural effusion. Tumor is felt to be unlikely given x-ray report of 05/29/2016. Followup PA and lateral chest X-ray is recommended in 3-4 weeks following trial of antibiotic therapy to ensure resolution and exclude underlying malignancy. Electronically Signed   By: Marijo Sanes M.D.   On: 07/02/2016 12:12    EKG:   Normal sinus rhythm 92 bpm, left atrial enlargement, incomplete right bundle-branch block  IMPRESSION AND PLAN:   1. Acute respiratory failure with hypoxia and acidosis. Patient placed on BiPAP and will be admitted to the critical care unit for closer monitoring. Case discussed with critical care specialist Dr. Alva Garnet to see the patient. 2. Clinical sepsis with Right-sided pneumonia, leukocytosis and tachypnea. ER physician placed on aggressive antibiotics with vancomycin and Zosyn. I will continue vancomycin and switch Zosyn to Maxipime and add Zithromax for right now. Obtain an MRSA PCR, pro-calcitonin and influenza swab. 3. Acute kidney injury. Gentle IV fluid hydration with history of congestive heart failure. Hold Zaroxolyn and losartan 4. COPD on chronic oxygen. Ordered nebulizer treatments and low-dose Solu-Medrol daily 5. Sleep apnea on CPAP at night will use BiPAP while here 6. History of diastolic congestive heart failure. This does not seem  like heart failure at this point. 7. Essential hypertension blood pressure on the lower side. IV fluid hydration 8. Hypothyroidism unspecified on levothyroxine 9. Restless leg syndrome on Mirapex  All the records are reviewed and case discussed with ED provider. Management plans discussed with the patient, family and they are in agreement.  CODE STATUS: DO NOT RESUSCITATE  TOTAL TIME TAKING CARE OF THIS PATIENT: 60 minutes, critical care time. Case discussed with Dr. Alva Garnet critical care specialist.   Milas Hock.D on 07/02/2016 at 12:51 PM  Between 7am to 6pm - Pager - [phone]  After 6pm call admission pager [phone]  [facility] Office  [phone]  CC: Primary care physician; Adin Hector, MD

## 2016-07-02 NOTE — ED Notes (Signed)
Date and time results received: 07/02/16 1200   Test: Troponin Critical Value: 0.06  Name of Provider Notified: Dr. Corky Downs

## 2016-07-02 NOTE — ED Notes (Signed)
Sister and admitting MD at bedside

## 2016-07-02 NOTE — ED Notes (Signed)
Musician switched BP cuff from R arm to R leg. Will continue to monitor BP.

## 2016-07-02 NOTE — ED Notes (Signed)
CODE  SEPSIS  CALLED TO  PHIL  AT  [facility] 

## 2016-07-02 NOTE — Progress Notes (Signed)
Notified E Link RN about patient's blood pressure which decreased to SBP in upper 80s and MAP in lower 60s even on recheck. E Link RN to notify E Link MD. Patient back on bipap now due to increased lethargy and while arousable appears to be sleeping in between care. Team will continue to monitor.

## 2016-07-02 NOTE — ED Notes (Signed)
Switched BP cuff from L arm to R arm.

## 2016-07-02 NOTE — ED Notes (Signed)
Pt alert, talking, answering questions. Will continue to monitor BP.

## 2016-07-02 NOTE — ED Notes (Addendum)
Pt color on ears and finger tips appears slightly less purple. Pt appears exhausted. Still talking back when asked a question.   [facility] ED medic taking a manual pressure on L arm of 72/34. R arm manual BP 60/40.

## 2016-07-02 NOTE — Progress Notes (Signed)
eLink Physician-Brief Progress Note Patient Name: Hannah Powell DOB: 04/03/1945 MRN: [id number]   Date of Service  07/02/2016  HPI/Events of Note  Hypotension - BP = 89/54. Last LVEF = 65%.  eICU Interventions  Will bolus with 0.9 NaCl 1 liter IV over 1 hour now.      Intervention Category Intermediate Interventions: Hypertension - evaluation and management  Lysle Dingwall 07/02/2016, 6:39 PM

## 2016-07-02 NOTE — ED Notes (Signed)
Pt oxygen maintaining 95%-96% on bipap.

## 2016-07-03 DIAGNOSIS — N179 Acute kidney failure, unspecified: Secondary | ICD-10-CM

## 2016-07-03 DIAGNOSIS — L8990 Pressure ulcer of unspecified site, unspecified stage: Secondary | ICD-10-CM | POA: Insufficient documentation

## 2016-07-03 LAB — CBC
HCT: 41.5 % (ref 35.0–47.0)
Hemoglobin: 13.3 g/dL (ref 12.0–16.0)
MCH: 30 pg (ref 26.0–34.0)
MCHC: 31.9 g/dL — ABNORMAL LOW (ref 32.0–36.0)
MCV: 94 fL (ref 80.0–100.0)
PLATELETS: 172 10*3/uL (ref 150–440)
RBC: 4.42 MIL/uL (ref 3.80–5.20)
RDW: 16.7 % — ABNORMAL HIGH (ref 11.5–14.5)
WBC: 35.4 10*3/uL — AB (ref 3.6–11.0)

## 2016-07-03 LAB — EXPECTORATED SPUTUM ASSESSMENT W REFEX TO RESP CULTURE

## 2016-07-03 LAB — BLOOD CULTURE ID PANEL (REFLEXED)
Acinetobacter baumannii: NOT DETECTED
CANDIDA ALBICANS: NOT DETECTED
CANDIDA GLABRATA: NOT DETECTED
CANDIDA KRUSEI: NOT DETECTED
CANDIDA PARAPSILOSIS: NOT DETECTED
CANDIDA TROPICALIS: NOT DETECTED
ENTEROBACTERIACEAE SPECIES: NOT DETECTED
ESCHERICHIA COLI: NOT DETECTED
Enterobacter cloacae complex: NOT DETECTED
Enterococcus species: NOT DETECTED
HAEMOPHILUS INFLUENZAE: NOT DETECTED
KLEBSIELLA OXYTOCA: NOT DETECTED
KLEBSIELLA PNEUMONIAE: NOT DETECTED
Listeria monocytogenes: NOT DETECTED
METHICILLIN RESISTANCE: DETECTED — AB
Neisseria meningitidis: NOT DETECTED
Proteus species: NOT DETECTED
Pseudomonas aeruginosa: NOT DETECTED
STREPTOCOCCUS PNEUMONIAE: NOT DETECTED
STREPTOCOCCUS PYOGENES: NOT DETECTED
Serratia marcescens: NOT DETECTED
Staphylococcus aureus (BCID): NOT DETECTED
Staphylococcus species: DETECTED — AB
Streptococcus agalactiae: NOT DETECTED
Streptococcus species: NOT DETECTED

## 2016-07-03 LAB — BASIC METABOLIC PANEL
Anion gap: 10 (ref 5–15)
BUN: 62 mg/dL — ABNORMAL HIGH (ref 6–20)
CHLORIDE: 101 mmol/L (ref 101–111)
CO2: 25 mmol/L (ref 22–32)
CREATININE: 2.53 mg/dL — AB (ref 0.44–1.00)
Calcium: 7.6 mg/dL — ABNORMAL LOW (ref 8.9–10.3)
GFR calc non Af Amer: 18 mL/min — ABNORMAL LOW (ref >60)
GFR, EST AFRICAN AMERICAN: 21 mL/min — AB (ref >60)
Glucose, Bld: 151 mg/dL — ABNORMAL HIGH (ref 65–99)
Potassium: 5.1 mmol/L (ref 3.5–5.1)
SODIUM: 136 mmol/L (ref 135–145)

## 2016-07-03 LAB — EXPECTORATED SPUTUM ASSESSMENT W GRAM STAIN, RFLX TO RESP C

## 2016-07-03 LAB — PROCALCITONIN: Procalcitonin: 8.82 ng/mL

## 2016-07-03 MED ORDER — FLUTICASONE FUROATE-VILANTEROL 100-25 MCG/INH IN AEPB
1.0000 | INHALATION_SPRAY | Freq: Every day | RESPIRATORY_TRACT | Status: DC
  Administered 2016-07-03 – 2016-07-04 (×2): 1 via RESPIRATORY_TRACT
  Filled 2016-07-03: qty 28

## 2016-07-03 MED ORDER — GUAIFENESIN-DM 100-10 MG/5ML PO SYRP
5.0000 mL | ORAL_SOLUTION | ORAL | Status: DC | PRN
  Administered 2016-07-03: 5 mL via ORAL
  Filled 2016-07-03: qty 5

## 2016-07-03 MED ORDER — HYDROCOD POLST-CPM POLST ER 10-8 MG/5ML PO SUER
5.0000 mL | Freq: Two times a day (BID) | ORAL | Status: DC | PRN
  Administered 2016-07-03 – 2016-07-07 (×4): 5 mL via ORAL
  Filled 2016-07-03 (×5): qty 5

## 2016-07-03 MED ORDER — IPRATROPIUM-ALBUTEROL 0.5-2.5 (3) MG/3ML IN SOLN
3.0000 mL | RESPIRATORY_TRACT | Status: DC | PRN
  Administered 2016-07-04: 3 mL via RESPIRATORY_TRACT
  Filled 2016-07-03: qty 3

## 2016-07-03 NOTE — Progress Notes (Addendum)
[facility] at [place] NAME: Hannah Powell    MR#:  [id number]  DATE OF BIRTH:  02-23-1946  SUBJECTIVE:  Came in with increasing SOB and found to have right sided pneumonia. Pt is off BIPAP  REVIEW OF SYSTEMS:   Review of Systems  Constitutional: Negative for chills, fever and weight loss.  HENT: Negative for ear discharge, ear pain and nosebleeds.   Eyes: Negative for blurred vision, pain and discharge.  Respiratory: Positive for sputum production and shortness of breath. Negative for wheezing and stridor.   Cardiovascular: Negative for chest pain, palpitations, orthopnea and PND.  Gastrointestinal: Negative for abdominal pain, diarrhea, nausea and vomiting.  Genitourinary: Negative for frequency and urgency.  Musculoskeletal: Negative for back pain and joint pain.  Neurological: Positive for weakness. Negative for sensory change, speech change and focal weakness.  Psychiatric/Behavioral: Negative for depression and hallucinations. The patient is not nervous/anxious.    Tolerating Diet:yes Tolerating PT: pending  DRUG ALLERGIES:   Allergies  Allergen Reactions  . Lyrica [Pregabalin] Other (See Comments)    Reaction: made Restless Leg Syndrome worse     VITALS:  Blood pressure 103/79, pulse 94, temperature 98.6 F (37 C), temperature source Oral, resp. rate 17, height 5\' 5"  (1.651 m), weight 113.4 kg (250 lb), SpO2 100 %.  PHYSICAL EXAMINATION:   Physical Exam  GENERAL:  71 y.o.-year-old patient lying in the bed with no acute distress.  EYES: Pupils equal, round, reactive to light and accommodation. No scleral icterus. Extraocular muscles intact.  HEENT: Head atraumatic, normocephalic. Oropharynx and nasopharynx clear.  NECK:  Supple, no jugular venous distention. No thyroid enlargement, no tenderness.  LUNGS: Normal breath sounds bilaterally, no wheezing, rales, rhonchi. No use of accessory muscles of respiration.   CARDIOVASCULAR: S1, S2 normal. No murmurs, rubs, or gallops.  ABDOMEN: Soft, nontender, nondistended. Bowel sounds present. No organomegaly or mass.  EXTREMITIES: No cyanosis, clubbing or edema b/l.    NEUROLOGIC: Cranial nerves II through XII are intact. No focal Motor or sensory deficits b/l.   PSYCHIATRIC:  patient is alert and oriented x 3.  SKIN: No obvious rash, lesion, or ulcer.   LABORATORY PANEL:  CBC  Recent Labs Lab 07/03/16 0334  WBC 35.4*  HGB 13.3  HCT 41.5  PLT 172    Chemistries   Recent Labs Lab 07/02/16 1127 07/03/16 0334  NA 134* 136  K 4.4 5.1  CL 97* 101  CO2 28 25  GLUCOSE 95 151*  BUN 58* 62*  CREATININE 2.92* 2.53*  CALCIUM 8.1* 7.6*  AST 25  --   ALT 14  --   ALKPHOS 108  --   BILITOT 1.0  --    Cardiac Enzymes  Recent Labs Lab 07/02/16 1127  TROPONINI 0.06*   RADIOLOGY:  Dg Chest Portable 1 View  Result Date: 07/02/2016 CLINICAL DATA:  Shortness of breath since this morning. EXAM: PORTABLE CHEST 1 VIEW COMPARISON:  Chest x-ray 07/19/2015 and report from prior chest x-ray 05/29/2016. FINDINGS: The heart is enlarged but stable. There is mild tortuosity and ectasia of the thoracic aorta. Fairly extensive opacification of the right hemithorax possibly a combination of polylobar pneumonia and pleural effusion. Left basilar atelectasis but no definite left lung infiltrates. The bony thorax is intact. Stable cervical fusion hardware and left shoulder prosthesis. IMPRESSION: Fairly extensive airspace opacification involving the right hemithorax. This is most likely polylobar pneumonia and pleural effusion. Tumor is felt to be unlikely given x-ray  report of 05/29/2016. Followup PA and lateral chest X-ray is recommended in 3-4 weeks following trial of antibiotic therapy to ensure resolution and exclude underlying malignancy. Electronically Signed   By: Marijo Sanes M.D.   On: 07/02/2016 12:12   ASSESSMENT AND PLAN:  Hannah Powell  is a 71 y.o.  female presented with respiratory distress. She stated on Thursday she started feeling bad. She needed to increase her oxygen from 2 L to 3 L. On Saturday she had shaking chills and fever.   1. Acute respiratory failure with hypoxia and acidosis. - PatientWas placed on BiPAP  - now off BiPAP doing well on 3 L nasal oxygen   2. Clinical sepsis with Right-sided pneumonia, leukocytosis and tachypnea. ER physician placed on aggressive antibiotics with vancomycin and Zosyn. - will continue Maxipime and Zithromax  -  MRSA PCR negative   3. Acute kidney injury. Gentle IV fluid hydration with history of congestive heart failure. Hold Zaroxolyn and losartan  4. COPD on chronic oxygen. Ordered nebulizer treatments and low-dose Solu-Medrol daily  5. Sleep apnea on CPAP at night will use BiPAP while here  6. History of diastolic congestive heart failure. This does not seem like heart failure at this point.  7. Essential hypertension blood pressure on the lower side. IV fluid hydration  8. Hypothyroidism unspecified on levothyroxine  9. Restless leg syndrome on Mirapex  Case discussed with Care Management/Social Worker. Management plans discussed with the patient, family and they are in agreement.  CODE STATUS: DNR  DVT Prophylaxis: lovenox TOTAL TIME TAKING CARE OF THIS PATIENT: 35 minutes.  >50% time spent on counselling and coordination of care pt and husband  POSSIBLE D/C IN 2-3 DAYS, DEPENDING ON CLINICAL CONDITION.  Note: This dictation was prepared with Dragon dictation along with smaller phrase technology. Any transcriptional errors that result from this process are unintentional.  Hannah Powell M.D on 07/03/2016 at 3:28 PM  Between 7am to 6pm - Pager - [phone]  After 6pm go to [email or web address] - password [email or web address] [facility]  Office  [phone]  CC: Primary care physician; Adin Hector, MD

## 2016-07-03 NOTE — Progress Notes (Signed)
Pt has remained alert and oriented with no c/o pain. NSR on cardiac monitor. Neo weaned off this am around 1100. BP has remained stable with MAP parameters > 60 per Dr Alva Garnet. Pt has been transitioned to 2LNC-SpO2 > 90%. Pt with a non-productive, cough-requesting cough medicine. Pt has been tolerating a cardiac/carb mod diet without noting any resp distress. Husband has been updated at bedside.

## 2016-07-03 NOTE — Progress Notes (Signed)
PHARMACY - PHYSICIAN COMMUNICATION CRITICAL VALUE ALERT - BLOOD CULTURE IDENTIFICATION (BCID)  Results for orders placed or performed during the hospital encounter of 07/02/16  Blood Culture ID Panel (Reflexed) (Collected: 07/02/2016 11:47 AM)  Result Value Ref Range   Enterococcus species NOT DETECTED NOT DETECTED   Listeria monocytogenes NOT DETECTED NOT DETECTED   Staphylococcus species DETECTED (A) NOT DETECTED   Staphylococcus aureus NOT DETECTED NOT DETECTED   Methicillin resistance DETECTED (A) NOT DETECTED   Streptococcus species NOT DETECTED NOT DETECTED   Streptococcus agalactiae NOT DETECTED NOT DETECTED   Streptococcus pneumoniae NOT DETECTED NOT DETECTED   Streptococcus pyogenes NOT DETECTED NOT DETECTED   Acinetobacter baumannii NOT DETECTED NOT DETECTED   Enterobacteriaceae species NOT DETECTED NOT DETECTED   Enterobacter cloacae complex NOT DETECTED NOT DETECTED   Escherichia coli NOT DETECTED NOT DETECTED   Klebsiella oxytoca NOT DETECTED NOT DETECTED   Klebsiella pneumoniae NOT DETECTED NOT DETECTED   Proteus species NOT DETECTED NOT DETECTED   Serratia marcescens NOT DETECTED NOT DETECTED   Haemophilus influenzae NOT DETECTED NOT DETECTED   Neisseria meningitidis NOT DETECTED NOT DETECTED   Pseudomonas aeruginosa NOT DETECTED NOT DETECTED   Candida albicans NOT DETECTED NOT DETECTED   Candida glabrata NOT DETECTED NOT DETECTED   Candida krusei NOT DETECTED NOT DETECTED   Candida parapsilosis NOT DETECTED NOT DETECTED   Candida tropicalis NOT DETECTED NOT DETECTED    Name of physician (or Provider) Contacted: Kasa  Changes to prescribed antibiotics required: Continue Cefepime   Chaniqua Brisby D 07/03/2016  6:00 PM

## 2016-07-03 NOTE — Progress Notes (Signed)
PULMONARY / CRITICAL CARE MEDICINE   Name: Hannah Powell MRN: [id number] DOB: 10-31-1945    ADMISSION DATE:  07/02/2016 CONSULTATION DATE: 07/02/16  REFERRING MD: Earleen Newport  PT PROFILE: 71 y.o. F former smoker pt of [facility] with moderate to severe COPD admitted via ED with acute on chronic hypoxemic/hypercarbic respiratory failure due to pneumonia. She is DNR. Supported on BiPAP   SUBJECTIVE:  Looks much better and cognition is much improved but started on low dose phenylephrine last PM for hypotension. Cognition is fully intact and she is comfortable on [place] O2  VITAL SIGNS: BP (!) 82/51   Pulse 97   Temp 98.8 F (37.1 C) (Axillary)   Resp 20   Ht 5\' 5"  (1.651 m)   Wt 113.4 kg (250 lb)   SpO2 99%   BMI 41.60 kg/m   HEMODYNAMICS:    VENTILATOR SETTINGS: FiO2 (%):  [40 %] 40 %  INTAKE / OUTPUT: I/O last 3 completed shifts: In: 2533.2 [I.V.:983.2; IV Piggyback:1550] Out: 525 [Urine:525]  PHYSICAL EXAMINATION: General: RASS 0, + F/C Neuro: No focal deficits, cranial nerves intact HEENT: NCAT, sclerae white Cardiovascular: Regular, no murmurs Lungs: Rhonchi on the right with bronchial BS, no wheezes Abdomen: Obese, soft, diminished bowel sounds Extremities: Warm, no edema Skin: Limited exam, no lesions noted  LABS:  BMET  Recent Labs Lab 07/02/16 1127 07/03/16 0334  NA 134* 136  K 4.4 5.1  CL 97* 101  CO2 28 25  BUN 58* 62*  CREATININE 2.92* 2.53*  GLUCOSE 95 151*    Electrolytes  Recent Labs Lab 07/02/16 1127 07/03/16 0334  CALCIUM 8.1* 7.6*    CBC  Recent Labs Lab 07/02/16 1127 07/03/16 0334  WBC 36.7* 35.4*  HGB 13.7 13.3  HCT 43.4 41.5  PLT 165 172    Coag's No results for input(s): APTT, INR in the last 168 hours.  Sepsis Markers  Recent Labs Lab 07/02/16 1127 07/02/16 2306 07/03/16 0334  LATICACIDVEN 1.5 1.5  --   PROCALCITON 14.57  --  8.82    ABG No results for input(s): PHART, PCO2ART, PO2ART in the last 168  hours.  Liver Enzymes  Recent Labs Lab 07/02/16 1127  AST 25  ALT 14  ALKPHOS 108  BILITOT 1.0  ALBUMIN 3.1*    Cardiac Enzymes  Recent Labs Lab 07/02/16 1127  TROPONINI 0.06*    Glucose  Recent Labs Lab 07/02/16 1505  GLUCAP 87   Urine strep antigen: Positive CXR: Extensive infiltrate involving all lobes on the right   ASSESSMENT / PLAN: Acute on chronic hypoxemic/hypercarbic respiratory failure Moderate to severe COPD Extensive right-sided pneumonia  Strep antigen positive - likely pneumococcal pneumonia Acute kidney injury DNR  Cont ICU/stepdown until off Vasopressors Continue antibiotics- we'll discontinue vancomycin. Continue cefepime and azithromycin for now Continue BiPAP as needed Continue nebulized steroids and bronchodilators Stopped systemic steroids in the absence of wheezing Monitor BMET intermittently Monitor I/Os Correct electrolytes as indicated   Merton Border, MD PCCM service Mobile [phone] Pager [phone]  07/03/2016, 2:18 PM

## 2016-07-03 NOTE — Progress Notes (Signed)
Pharmacy Antibiotic Note  Hannah Powell is a 71 y.o. female admitted on 07/02/2016 with sepsis.  Pharmacy has been consulted for vancomycin and cefepime dosing. Patient is also ordered azithromycin. Vancomycin d/c 4/17.   Plan: Continuecefepime 2 g IV daily per renal function and indication.   Height: 5\' 5"  (165.1 cm) Weight: 250 lb (113.4 kg) IBW/kg (Calculated) : 57  Temp (24hrs), Avg:98.4 F (36.9 C), Min:98.1 F (36.7 C), Max:98.8 F (37.1 C)   Recent Labs Lab 07/02/16 1127 07/02/16 2306 07/03/16 0334  WBC 36.7*  --  35.4*  CREATININE 2.92*  --  2.53*  LATICACIDVEN 1.5 1.5  --     Estimated Creatinine Clearance: 26.4 mL/min (A) (by C-G formula based on SCr of 2.53 mg/dL (H)).    Allergies  Allergen Reactions  . Lyrica [Pregabalin] Other (See Comments)    Reaction: made Restless Leg Syndrome worse     Thank you for allowing pharmacy to be a part of this patient's care.  Napoleon Form, PharmD Clinical Pharmacist 07/03/2016 2:56 PM

## 2016-07-04 ENCOUNTER — Inpatient Hospital Stay: Payer: Medicare HMO

## 2016-07-04 DIAGNOSIS — J441 Chronic obstructive pulmonary disease with (acute) exacerbation: Secondary | ICD-10-CM

## 2016-07-04 LAB — BASIC METABOLIC PANEL
ANION GAP: 9 (ref 5–15)
BUN: 66 mg/dL — ABNORMAL HIGH (ref 6–20)
CO2: 27 mmol/L (ref 22–32)
Calcium: 8.3 mg/dL — ABNORMAL LOW (ref 8.9–10.3)
Chloride: 100 mmol/L — ABNORMAL LOW (ref 101–111)
Creatinine, Ser: 2.08 mg/dL — ABNORMAL HIGH (ref 0.44–1.00)
GFR, EST AFRICAN AMERICAN: 27 mL/min — AB (ref >60)
GFR, EST NON AFRICAN AMERICAN: 23 mL/min — AB (ref >60)
GLUCOSE: 94 mg/dL (ref 65–99)
POTASSIUM: 4.6 mmol/L (ref 3.5–5.1)
Sodium: 136 mmol/L (ref 135–145)

## 2016-07-04 LAB — BLOOD GAS, ARTERIAL
Acid-base deficit: 4.1 mmol/L — ABNORMAL HIGH (ref 0.0–2.0)
Bicarbonate: 26.8 mmol/L (ref 20.0–28.0)
FIO2: 0.24
O2 SAT: 80.2 %
PATIENT TEMPERATURE: 37
PCO2 ART: 77 mmHg — AB (ref 32.0–48.0)
pH, Arterial: 7.15 — CL (ref 7.350–7.450)
pO2, Arterial: 58 mmHg — ABNORMAL LOW (ref 83.0–108.0)

## 2016-07-04 LAB — BLOOD GAS, VENOUS
ACID-BASE DEFICIT: 2.3 mmol/L — AB (ref 0.0–2.0)
Bicarbonate: 28.9 mmol/L — ABNORMAL HIGH (ref 20.0–28.0)
PATIENT TEMPERATURE: 37
PCO2 VEN: 83 mmHg — AB (ref 44.0–60.0)
pH, Ven: 7.15 — CL (ref 7.250–7.430)

## 2016-07-04 LAB — LEGIONELLA PNEUMOPHILA SEROGP 1 UR AG: L. pneumophila Serogp 1 Ur Ag: NEGATIVE

## 2016-07-04 LAB — PROCALCITONIN: PROCALCITONIN: 4.81 ng/mL

## 2016-07-04 LAB — GLUCOSE, CAPILLARY
GLUCOSE-CAPILLARY: 56 mg/dL — AB (ref 65–99)
GLUCOSE-CAPILLARY: 63 mg/dL — AB (ref 65–99)
GLUCOSE-CAPILLARY: 76 mg/dL (ref 65–99)

## 2016-07-04 MED ORDER — ONDANSETRON HCL 4 MG/2ML IJ SOLN
4.0000 mg | Freq: Four times a day (QID) | INTRAMUSCULAR | Status: DC | PRN
Start: 2016-07-04 — End: 2016-07-12
  Administered 2016-07-04 – 2016-07-12 (×3): 4 mg via INTRAVENOUS
  Filled 2016-07-04 (×3): qty 2

## 2016-07-04 MED ORDER — DEXTROSE 5 % IV SOLN
2.0000 g | Freq: Two times a day (BID) | INTRAVENOUS | Status: DC
  Administered 2016-07-04 – 2016-07-05 (×2): 2 g via INTRAVENOUS
  Filled 2016-07-04 (×3): qty 2

## 2016-07-04 MED ORDER — AZITHROMYCIN 250 MG PO TABS
250.0000 mg | ORAL_TABLET | Freq: Every day | ORAL | Status: DC
  Administered 2016-07-04: 250 mg via ORAL
  Filled 2016-07-04: qty 1

## 2016-07-04 MED ORDER — ACETYLCYSTEINE 20 % IN SOLN
4.0000 mL | Freq: Once | RESPIRATORY_TRACT | Status: AC
  Administered 2016-07-04: 4 mL via RESPIRATORY_TRACT
  Filled 2016-07-04: qty 4

## 2016-07-04 MED ORDER — DEXTROSE 50 % IV SOLN
1.0000 | Freq: Once | INTRAVENOUS | Status: AC
  Administered 2016-07-05: 50 mL via INTRAVENOUS
  Filled 2016-07-04: qty 50

## 2016-07-04 NOTE — Progress Notes (Signed)
PT Cancellation Note  Patient Details Name: Hannah Powell MRN: [id number] DOB: 21-Jun-1945   Cancelled Treatment:    Reason Eval/Treat Not Completed: Other (comment). Pt is now on bi-pap and transferred to CCU secondary to respiratory distress and lethargy. Will cancel current orders for PT treatment. Please re-order when medically ready.   Chau Savell 07/04/2016, 2:48 PM  Greggory Stallion, PT, DPT [phone]

## 2016-07-04 NOTE — Progress Notes (Signed)
PULMONARY / CRITICAL CARE MEDICINE   Name: Hannah Powell MRN: [id number] DOB: Jan 17, 1946    ADMISSION DATE:  07/02/2016 CONSULTATION DATE: 07/02/16  REFERRING MD: Earleen Newport  PT PROFILE: 71 y.o. F former smoker pt of pulmonologist Dr. Mortimer Fries admitted 04/16 with acute on chronic hypercapnic hypoxic respiratory failure secondary to AECOPD and right sided pneumonia requiring continuous BIpap.   SIGNIFICANT EVENTS: 04/16-Pt admitted to [facility] due to need of continuous Bipap  04/17-Pt transferred to [facility] unit  04/18-Pt transferred back to [facility] Unit due to acute on chronic hypercapnic respiratory failure requiring continuous BIpap   REVIEW OF SYSTEMS:  Unable to assess pt lethargic  SUBJECTIVE:  Pt very lethargic on 2L O2 and having intermittent jerky movement.  Her family member at the bedside is concerned about cognition he states "this is not her baseline"  VITAL SIGNS: BP 108/78 (BP Location: Right Arm)   Pulse 87   Temp 97.9 F (36.6 C) (Oral)   Resp 20   Ht 5\' 5"  (1.651 m)   Wt 113.4 kg (250 lb)   SpO2 97%   BMI 41.60 kg/m   HEMODYNAMICS:    VENTILATOR SETTINGS:    INTAKE / OUTPUT: I/O last 3 completed shifts: In: 2746.8 [P.O.:780; I.V.:1466.8; IV Piggyback:500] Out: 275 [Urine:275]  PHYSICAL EXAMINATION: General: well developed, well nourished, obese Caucasian female  Neuro: lethargic, follows commands, PERRL  HEENT: supple, no JVD  Cardiovascular: nsr, s1s2, M/R/G Lungs: mild rhonchi in all lobes on the right, slightly labored  Abdomen: Obese, soft, hypoative BS x4 Extremities: Warm, 1+ bilateral lower extremity edema  Skin: no rashes or lesions present   LABS:  BMET  Recent Labs Lab 07/02/16 1127 07/03/16 0334 07/04/16 0419  NA 134* 136 136  K 4.4 5.1 4.6  CL 97* 101 100*  CO2 28 25 27   BUN 58* 62* 66*  CREATININE 2.92* 2.53* 2.08*  GLUCOSE 95 151* 94    Electrolytes  Recent Labs Lab 07/02/16 1127 07/03/16 0334  07/04/16 0419  CALCIUM 8.1* 7.6* 8.3*    CBC  Recent Labs Lab 07/02/16 1127 07/03/16 0334  WBC 36.7* 35.4*  HGB 13.7 13.3  HCT 43.4 41.5  PLT 165 172    Coag's No results for input(s): APTT, INR in the last 168 hours.  Sepsis Markers  Recent Labs Lab 07/02/16 1127 07/02/16 2306 07/03/16 0334 07/04/16 0419  LATICACIDVEN 1.5 1.5  --   --   PROCALCITON 14.57  --  8.82 4.81    ABG  Recent Labs Lab 07/04/16 1155  PHART 7.15*  PCO2ART 77*  PO2ART 58*    Liver Enzymes  Recent Labs Lab 07/02/16 1127  AST 25  ALT 14  ALKPHOS 108  BILITOT 1.0  ALBUMIN 3.1*    Cardiac Enzymes  Recent Labs Lab 07/02/16 1127  TROPONINI 0.06*    Glucose  Recent Labs Lab 07/02/16 1505  GLUCAP 87   Urine strep antigen: Positive CXR: Extensive infiltrate involving all lobes on the right   ASSESSMENT / PLAN: Acute on chronic hypoxemic/hypercarbic respiratory failure Moderate to severe COPD Extensive right-sided pneumonia  Strep antigen positive - likely pneumococcal pneumonia Acute kidney injury-improving  Hx: OSA-cpap qhs  P:  Will transfer pt back to Stepdown Unit for continuous Bipap will wean as tolerated and will need Bipap qhs  Maintain O2 sats 88% to 92% Continue cefepime and azithromycin for now will transition all abx to po once mentation improves  Continue bronchodilator therapy  Monitor BMET intermittently Monitor I/Os Correct  electrolytes as indicated Follow cultures and sensitivities  Upon discharge pt will need to follow-up with Dr. Mortimer Fries in 3-4 weeks for repeat CXR   Marda Stalker, [place] Pager [phone] (please enter 7 digits) PCCM Consult Pager [phone] (please enter 7 digits)    PCCM ATTENDING ATTESTATION:  I have evaluated patient with the APP [place], reviewed database in its entirety and discussed care plan in detail. In addition, this patient was discussed on multidisciplinary rounds.   Important  exam findings: More lethargic and tremulous Increased wheezes  ABG reveals worsening (recurrent) respiratory acidosis  Major problems addressed by PCCM team: Mod-severe COPD R sided CAP - likely pneumococcal (strep Ag positive) Acute bronchospasm   PLAN/REC: Transfer to ICU/SDU for NIPPV Cont abx, steroids, nebs, O2 as above   Merton Border, MD PCCM service Mobile [phone] Pager [phone] 07/04/2016 2:00 PM

## 2016-07-04 NOTE — Progress Notes (Signed)
Pt being moved to ICU room 19. Report called to tracy RN. Family was called and updated on status and plan of care.

## 2016-07-04 NOTE — Progress Notes (Signed)
Assisted with patient trans port to ICU room 19. Pt was transported while on the BiPap with no complications.

## 2016-07-04 NOTE — Progress Notes (Signed)
Pharmacy Antibiotic Note  Hannah Powell is a 71 y.o. female admitted on 07/02/2016 with sepsis.  Pharmacy has been consulted for vancomycin and cefepime dosing. Patient is also ordered azithromycin.   Plan: Will increase cefepime to 2 g iv q 12 hours with improvement in SCr.   Height: 5\' 5"  (165.1 cm) Weight: 250 lb (113.4 kg) IBW/kg (Calculated) : 57  Temp (24hrs), Avg:98.2 F (36.8 C), Min:97.8 F (36.6 C), Max:98.6 F (37 C)   Recent Labs Lab 07/02/16 1127 07/02/16 2306 07/03/16 0334 07/04/16 0419  WBC 36.7*  --  35.4*  --   CREATININE 2.92*  --  2.53* 2.08*  LATICACIDVEN 1.5 1.5  --   --     Estimated Creatinine Clearance: 32.1 mL/min (A) (by C-G formula based on SCr of 2.08 mg/dL (H)).    Allergies  Allergen Reactions  . Lyrica [Pregabalin] Other (See Comments)    Reaction: made Restless Leg Syndrome worse     Thank you for allowing pharmacy to be a part of this patient's care.  [name], PharmD Clinical Pharmacist 07/04/2016 1:46 PM

## 2016-07-04 NOTE — Evaluation (Signed)
Physical Therapy Evaluation Patient Details Name: Hannah Powell MRN: [id number] DOB: [birth year] Today's Date: 07/04/2016   History of Present Illness  Pt admitted for acute respiratory failure with hypoxia. Pt now with + pneumonia and complaints of respiratory distress. PMH includes anxiety, CHF, COPD, HTN and wears 3L of O2 PRN.   Clinical Impression  Pt is a pleasant 71 year old female who was admitted for acute respiratory failure with hypoxia. Pt performs transfers with cga and ambulation with cga and loft strand crutches. Pt with no LOB during ambulation, however fatigues quickly and needs cues for pursed lip breathing as O2 decreases with exertion. Pt demonstrates deficits with strength/endurance/mobility. Pt appears slightly confused, often answering questions wrong and then correcting herself. Unsure of accurate history. Pt appears somewhat close to baseline level with mobility. Would benefit from skilled PT to address above deficits and promote optimal return to PLOF. Recommend transition to [place] upon discharge from acute hospitalization.       Follow Up Recommendations Home health PT;Supervision for mobility/OOB    Equipment Recommendations  None recommended by PT    Recommendations for Other Services       Precautions / Restrictions Precautions Precautions: Fall Restrictions Weight Bearing Restrictions: No      Mobility  Bed Mobility               General bed mobility comments: not performed as pt received in recliner  Transfers Overall transfer level: Needs assistance Equipment used: Lofstrands Transfers: Sit to/from Stand Sit to Stand: Min guard         General transfer comment: assist for transfers with pt taking increased time for completion. Upright posture noted  Ambulation/Gait Ambulation/Gait assistance: Min guard Ambulation Distance (Feet): 80 Feet Assistive device: Lofstrands Gait Pattern/deviations: Step-through pattern;Wide base of  support     General Gait Details: very slow gait speed with pt with SOB symptoms limiting further exertion. All mobility performed on 3L of O2 with sats decreasing to 85% with exertion. Pt cued for pursed lip breathing. No LOB or unsteadiness noted  Stairs            Wheelchair Mobility    Modified Rankin (Stroke Patients Only)       Balance Overall balance assessment: Needs assistance Sitting-balance support: Feet supported Sitting balance-Leahy Scale: Normal     Standing balance support: Bilateral upper extremity supported Standing balance-Leahy Scale: Good                               Pertinent Vitals/Pain Pain Assessment: No/denies pain    Home Living Family/patient expects to be discharged to:: Private residence Living Arrangements: Spouse/significant other Available Help at Discharge: Available 24 hours/day Type of Home: House Home Access: Stairs to enter Entrance Stairs-Rails: Can reach both Entrance Stairs-Number of Steps: 5 Home Layout: One level Home Equipment:  (loft strand crutches)      Prior Function Level of Independence: Independent with assistive device(s)         Comments: ambulates using loftstrand crutches. Reports she typically only ambulates 10-12' in home. She then reports she is able to ambulate outside as well. Pt is poor historian.     Hand Dominance        Extremity/Trunk Assessment   Upper Extremity Assessment Upper Extremity Assessment: Overall WFL for tasks assessed    Lower Extremity Assessment Lower Extremity Assessment: Generalized weakness (B LE grossly 4/5)  Communication   Communication: No difficulties  Cognition Arousal/Alertness: Awake/alert Behavior During Therapy: WFL for tasks assessed/performed Overall Cognitive Status: Impaired/Different from baseline                                        General Comments      Exercises Other Exercises Other Exercises:  Seated ther-ex performed including B LE alt. LAQ, marching, and ankle pumps. All ther-ex performed x 10 reps with cues for pursed lip breathing. Safe technique performed.   Assessment/Plan    PT Assessment Patient needs continued PT services  PT Problem List Decreased strength;Decreased balance;Decreased mobility;Decreased cognition       PT Treatment Interventions Gait training;DME instruction;Therapeutic exercise;Balance training    PT Goals (Current goals can be found in the Care Plan section)  Acute Rehab PT Goals Patient Stated Goal: to get stronger PT Goal Formulation: With patient Time For Goal Achievement: 07/18/16 Potential to Achieve Goals: Good    Frequency Min 2X/week   Barriers to discharge        Co-evaluation               End of Session Equipment Utilized During Treatment: Gait belt;Oxygen Activity Tolerance: Patient tolerated treatment well Patient left: in chair (no chair alarm available; RN and aide notified) Nurse Communication: Mobility status PT Visit Diagnosis: Muscle weakness (generalized) (M62.81);Difficulty in walking, not elsewhere classified (R26.2)    Time: 9357-0177 PT Time Calculation (min) (ACUTE ONLY): 21 min   Charges:   PT Evaluation $PT Eval Low Complexity: 1 Procedure PT Treatments $Therapeutic Exercise: 8-22 mins   PT G Codes:        Greggory Stallion, PT, DPT [phone]   Ambermarie Honeyman 07/04/2016, 11:05 AM

## 2016-07-04 NOTE — Progress Notes (Signed)
[place] [facility] [place] NAME: Hannah Powell    MR#:  [id number]  DATE OF BIRTH:  1946-03-18  SUBJECTIVE:  I want to go home Breathing better  REVIEW OF SYSTEMS:   Review of Systems  Constitutional: Negative for chills, fever and weight loss.  HENT: Negative for ear discharge, ear pain and nosebleeds.   Eyes: Negative for blurred vision, pain and discharge.  Respiratory: Positive for shortness of breath. Negative for sputum production, wheezing and stridor.   Cardiovascular: Negative for chest pain, palpitations, orthopnea and PND.  Gastrointestinal: Negative for abdominal pain, diarrhea, nausea and vomiting.  Genitourinary: Negative for frequency and urgency.  Musculoskeletal: Negative for back pain and joint pain.  Neurological: Positive for weakness. Negative for sensory change, speech change and focal weakness.  Psychiatric/Behavioral: Negative for depression and hallucinations. The patient is not nervous/anxious.    Tolerating Diet:yes Tolerating PT: pending  DRUG ALLERGIES:   Allergies  Allergen Reactions  . Lyrica [Pregabalin] Other (See Comments)    Reaction: made Restless Leg Syndrome worse     VITALS:  Blood pressure 108/78, pulse 87, temperature 97.9 F (36.6 C), temperature source Oral, resp. rate 20, height 5\' 5"  (1.651 m), weight 113.4 kg (250 lb), SpO2 97 %.  PHYSICAL EXAMINATION:   Physical Exam  GENERAL:  71 y.o.-year-old patient lying in the bed with no acute distress.  EYES: Pupils equal, round, reactive to light and accommodation. No scleral icterus. Extraocular muscles intact.  HEENT: Head atraumatic, normocephalic. Oropharynx and nasopharynx clear.  NECK:  Supple, no jugular venous distention. No thyroid enlargement, no tenderness.  LUNGS:distant breath sounds bilaterally, no wheezing, rales, rhonchi. No use of accessory muscles of respiration.  CARDIOVASCULAR: S1, S2 normal. No murmurs, rubs, or  gallops.  ABDOMEN: Soft, nontender, nondistended. Bowel sounds present. No organomegaly or mass.  EXTREMITIES: No cyanosis, clubbing or edema b/l.    NEUROLOGIC: Cranial nerves II through XII are intact. No focal Motor or sensory deficits b/l.   PSYCHIATRIC:  patient is alert and oriented x 3.  SKIN: No obvious rash, lesion, or ulcer.   LABORATORY PANEL:  CBC  Recent Labs Lab 07/03/16 0334  WBC 35.4*  HGB 13.3  HCT 41.5  PLT 172    Chemistries   Recent Labs Lab 07/02/16 1127  07/04/16 0419  NA 134*  < > 136  K 4.4  < > 4.6  CL 97*  < > 100*  CO2 28  < > 27  GLUCOSE 95  < > 94  BUN 58*  < > 66*  CREATININE 2.92*  < > 2.08*  CALCIUM 8.1*  < > 8.3*  AST 25  --   --   ALT 14  --   --   ALKPHOS 108  --   --   BILITOT 1.0  --   --   < > = values in this interval not displayed. Cardiac Enzymes  Recent Labs Lab 07/02/16 1127  TROPONINI 0.06*   RADIOLOGY:  Dg Chest Port 1 View  Result Date: 07/04/2016 CLINICAL DATA:  Respiratory failure EXAM: PORTABLE CHEST 1 VIEW COMPARISON:  07/02/2016 FINDINGS: Cardiac shadow is again mildly enlarged but stable. Diffuse right upper lobe infiltrate is seen unchanged from the prior exam. Left lung remains clear. No new bony abnormality is noted. Postsurgical changes in left shoulder are noted. Postsurgical changes are noted in the cervical spine. Loose screws are noted inferiorly but stable from multiple previous exams. IMPRESSION: Stable right  upper lobe infiltrate. Electronically Signed   By: Inez Catalina M.D.   On: 07/04/2016 07:33   Dg Chest Portable 1 View  Result Date: 07/02/2016 CLINICAL DATA:  Shortness of breath since this morning. EXAM: PORTABLE CHEST 1 VIEW COMPARISON:  Chest x-ray 07/19/2015 and report from prior chest x-ray 05/29/2016. FINDINGS: The heart is enlarged but stable. There is mild tortuosity and ectasia of the thoracic aorta. Fairly extensive opacification of the right hemithorax possibly a combination of polylobar  pneumonia and pleural effusion. Left basilar atelectasis but no definite left lung infiltrates. The bony thorax is intact. Stable cervical fusion hardware and left shoulder prosthesis. IMPRESSION: Fairly extensive airspace opacification involving the right hemithorax. This is most likely polylobar pneumonia and pleural effusion. Tumor is felt to be unlikely given x-ray report of 05/29/2016. Followup PA and lateral chest X-ray is recommended in 3-4 weeks following trial of antibiotic therapy to ensure resolution and exclude underlying malignancy. Electronically Signed   By: Marijo Sanes M.D.   On: 07/02/2016 12:12   ASSESSMENT AND PLAN:  Hannah Powell a 71 y.o.femalepresented with respiratory distress. She stated on Thursday she started feeling bad. She needed to increase her oxygen from 2 L to 3 L. On Saturday she had shaking chills and fever.   1. Acute respiratory failure with hypoxia and acidosis. - PatientWas placed on BiPAP  - now off BiPAP doing well on 3 L nasal oxygen  -1/2 BC GPC  2. Clinical sepsis with Right-sided pneumonia, leukocytosis and tachypnea. - will continue Maxipime and Zithromax  -  MRSA PCR negative   3. Acute kidney injury. Gentle IV fluid hydration with history of congestive heart failure. Hold Zaroxolyn and losartan -creat improving Baseline creat 0.75 in 01/2016 Came in with 2.53--- 2.08  4. COPD on chronic oxygen. Ordered nebulizer treatments and change to oral steroids  5. Sleep apnea on CPAP at night   6. History of diastolic congestive heart failure. This does not seem like heart failure at this point.  7. Essential hypertension blood pressure on the lower side. IV fluid hydration  8. Hypothyroidism unspecified on levothyroxine  9. Restless leg syndrome on Mirapex  Case discussed with Care Management/Social Worker. Management plans discussed with the patient, family and they are in agreement.  CODE STATUS: DNR  DVT Prophylaxis:  lovenox  TOTAL TIME TAKING CARE OF THIS PATIENT: *30 minutes.  >50% time spent on counselling and coordination of care  POSSIBLE D/C IN1DAYS, DEPENDING ON CLINICAL CONDITION.  Note: This dictation was prepared with Dragon dictation along with smaller phrase technology. Any transcriptional errors that result from this process are unintentional.  Venola Castello M.D on 07/04/2016 at 8:43 AM  Between 7am to 6pm - Pager - [phone]  After 6pm go to [email or web address] - password [email or web address] [facility]  Office  [phone]  CC: Primary care physician; Adin Hector, MD

## 2016-07-04 NOTE — Progress Notes (Signed)
Pt complains of nausea. Notified MD. Awaiting orders.

## 2016-07-05 ENCOUNTER — Ambulatory Visit: Payer: Self-pay | Admitting: Internal Medicine

## 2016-07-05 DIAGNOSIS — J13 Pneumonia due to Streptococcus pneumoniae: Secondary | ICD-10-CM

## 2016-07-05 DIAGNOSIS — R410 Disorientation, unspecified: Secondary | ICD-10-CM

## 2016-07-05 LAB — GLUCOSE, CAPILLARY
GLUCOSE-CAPILLARY: 100 mg/dL — AB (ref 65–99)
GLUCOSE-CAPILLARY: 68 mg/dL (ref 65–99)
GLUCOSE-CAPILLARY: 74 mg/dL (ref 65–99)
Glucose-Capillary: 102 mg/dL — ABNORMAL HIGH (ref 65–99)
Glucose-Capillary: 76 mg/dL (ref 65–99)
Glucose-Capillary: 80 mg/dL (ref 65–99)
Glucose-Capillary: 82 mg/dL (ref 65–99)

## 2016-07-05 LAB — CULTURE, RESPIRATORY W GRAM STAIN

## 2016-07-05 LAB — CBC
HCT: 44.7 % (ref 35.0–47.0)
Hemoglobin: 14.4 g/dL (ref 12.0–16.0)
MCH: 30.1 pg (ref 26.0–34.0)
MCHC: 32.1 g/dL (ref 32.0–36.0)
MCV: 93.7 fL (ref 80.0–100.0)
PLATELETS: 140 10*3/uL — AB (ref 150–440)
RBC: 4.77 MIL/uL (ref 3.80–5.20)
RDW: 16.6 % — ABNORMAL HIGH (ref 11.5–14.5)
WBC: 13.4 10*3/uL — ABNORMAL HIGH (ref 3.6–11.0)

## 2016-07-05 LAB — CULTURE, RESPIRATORY

## 2016-07-05 LAB — CREATININE, SERUM
Creatinine, Ser: 1.1 mg/dL — ABNORMAL HIGH (ref 0.44–1.00)
GFR calc Af Amer: 58 mL/min — ABNORMAL LOW (ref >60)
GFR calc non Af Amer: 50 mL/min — ABNORMAL LOW (ref >60)

## 2016-07-05 MED ORDER — ALPRAZOLAM 0.25 MG PO TABS
0.2500 mg | ORAL_TABLET | Freq: Three times a day (TID) | ORAL | Status: DC | PRN
  Administered 2016-07-05 – 2016-07-08 (×7): 0.25 mg via ORAL
  Filled 2016-07-05 (×7): qty 1

## 2016-07-05 MED ORDER — DEXTROSE 5 % IV SOLN
1.0000 g | INTRAVENOUS | Status: DC
  Administered 2016-07-05 – 2016-07-06 (×2): 1 g via INTRAVENOUS
  Filled 2016-07-05 (×3): qty 10

## 2016-07-05 MED ORDER — IPRATROPIUM-ALBUTEROL 0.5-2.5 (3) MG/3ML IN SOLN
3.0000 mL | Freq: Four times a day (QID) | RESPIRATORY_TRACT | Status: DC
  Administered 2016-07-05 – 2016-07-06 (×5): 3 mL via RESPIRATORY_TRACT
  Filled 2016-07-05 (×5): qty 3

## 2016-07-05 MED ORDER — BUDESONIDE 0.25 MG/2ML IN SUSP
0.2500 mg | Freq: Four times a day (QID) | RESPIRATORY_TRACT | Status: DC
Start: 2016-07-05 — End: 2016-07-07
  Administered 2016-07-05 – 2016-07-07 (×8): 0.25 mg via RESPIRATORY_TRACT
  Filled 2016-07-05 (×8): qty 2

## 2016-07-05 MED ORDER — VANCOMYCIN HCL IN DEXTROSE 1-5 GM/200ML-% IV SOLN
1000.0000 mg | Freq: Once | INTRAVENOUS | Status: AC
Start: 2016-07-05 — End: 2016-07-05
  Administered 2016-07-05: 1000 mg via INTRAVENOUS
  Filled 2016-07-05: qty 200

## 2016-07-05 MED ORDER — DEXTROSE IN LACTATED RINGERS 5 % IV SOLN
INTRAVENOUS | Status: DC
  Administered 2016-07-05: 1 mL via INTRAVENOUS
  Administered 2016-07-06: 23:00:00 via INTRAVENOUS

## 2016-07-05 MED ORDER — ALBUTEROL SULFATE (2.5 MG/3ML) 0.083% IN NEBU
2.5000 mg | INHALATION_SOLUTION | RESPIRATORY_TRACT | Status: DC | PRN
  Administered 2016-07-06: 2.5 mg via RESPIRATORY_TRACT
  Filled 2016-07-05: qty 3

## 2016-07-05 MED ORDER — VANCOMYCIN HCL 10 G IV SOLR
1500.0000 mg | INTRAVENOUS | Status: DC
  Administered 2016-07-05: 1500 mg via INTRAVENOUS
  Filled 2016-07-05 (×2): qty 1500

## 2016-07-05 NOTE — Progress Notes (Signed)
Pharmacy Antibiotic Note  Hannah Powell is a 71 y.o. female admitted on 07/02/2016 with respiratory failure due to AECOPD and PNA.  Patient was receiving cefepime and azithromycin with one set of blood cultures growing CNS. Second set now with [facility] in clusters and microbiology working on differentiation. Vancomycin ordered pending ID recommendations. Respiratory culture with S pneumoniae. Patient is also on ceftriaxone.   Plan: Vancomycin 1000 mg iv once then 1500 mg iv q 24 hours. Goal trough 15-20 mcg/ml.   ke: 0.046 (using CrCl of 50 ml/min between actual and adjusted body weight)  Height: 5\' 7"  (170.2 cm) Weight: 262 lb 5.6 oz (119 kg) IBW/kg (Calculated) : 61.6  Temp (24hrs), Avg:98.2 F (36.8 C), Min:97.7 F (36.5 C), Max:98.5 F (36.9 C)   Recent Labs Lab 07/02/16 1127 07/02/16 2306 07/03/16 0334 07/04/16 0419 07/05/16 0345  WBC 36.7*  --  35.4*  --  13.4*  CREATININE 2.92*  --  2.53* 2.08* 1.10*  LATICACIDVEN 1.5 1.5  --   --   --     Estimated Creatinine Clearance: 64.5 mL/min (A) (by C-G formula based on SCr of 1.1 mg/dL (H)).    Allergies  Allergen Reactions  . Lyrica [Pregabalin] Other (See Comments)    Reaction: made Restless Leg Syndrome worse     Antimicrobials this admission: Vancomycin and Zosyn 4/16  Azithromycin and cefepime 4/16 >> 4/19 Vancomycin 4/19 >> Ceftriaxone 4/19 >>  Dose adjustments this admission:   Microbiology results: 4/16 BCx: CNS 2/2 4/17 Sputum: S pneumoniae  4/16 MRSA PCR: negative  Thank you for allowing pharmacy to be a part of this patient's care.  [name] 07/05/2016 2:55 PM

## 2016-07-05 NOTE — Progress Notes (Signed)
Pharmacy Antibiotic Note  Hannah Powell is a 71 y.o. female admitted on 07/02/2016 with respiratory failure due to AECOPD and PNA.  Patient was receiving cefepime and azithromycin with one set of blood cultures growing CNS. Second set now with [facility] in clusters and microbiology working on differentiation. Vancomycin ordered pending ID recommendations. Respiratory culture with S pneumoniae. Patient is also on ceftriaxone.   Plan: Vancomycin 1000 mg iv once then 1500 mg iv q 24 hours. Goal trough 15-20 mcg/ml.   ke: 0.046  Height: 5\' 7"  (170.2 cm) Weight: 262 lb 5.6 oz (119 kg) IBW/kg (Calculated) : 61.6  Temp (24hrs), Avg:98.2 F (36.8 C), Min:97.7 F (36.5 C), Max:98.5 F (36.9 C)   Recent Labs Lab 07/02/16 1127 07/02/16 2306 07/03/16 0334 07/04/16 0419 07/05/16 0345  WBC 36.7*  --  35.4*  --  13.4*  CREATININE 2.92*  --  2.53* 2.08* 1.10*  LATICACIDVEN 1.5 1.5  --   --   --     Estimated Creatinine Clearance: 64.5 mL/min (A) (by C-G formula based on SCr of 1.1 mg/dL (H)).    Allergies  Allergen Reactions  . Lyrica [Pregabalin] Other (See Comments)    Reaction: made Restless Leg Syndrome worse     Antimicrobials this admission: Vancomycin and Zosyn 4/16  Azithromycin and cefepime 4/16 >> 4/19 Vancomycin 4/19 >> Ceftriaxone 4/19 >>  Dose adjustments this admission:   Microbiology results: 4/16 BCx: CNS 2/2 4/17 Sputum: S pneumoniae  4/16 MRSA PCR: negative  Thank you for allowing pharmacy to be a part of this patient's care.  [name] 07/05/2016 2:45 PM

## 2016-07-05 NOTE — Progress Notes (Signed)
[facility] NAME: Hannah Powell    MR#:  [id number]  DATE OF BIRTH:  [birth year]  SUBJECTIVE:  Transferred to CCU due to working of breathing. Per RN change in mental status with sob No focal weakness  REVIEW OF SYSTEMS:   Review of Systems  Constitutional: Negative for chills, fever and weight loss.  HENT: Negative for ear discharge, ear pain and nosebleeds.   Eyes: Negative for blurred vision, pain and discharge.  Respiratory: Positive for shortness of breath. Negative for sputum production, wheezing and stridor.   Cardiovascular: Negative for chest pain, palpitations, orthopnea and PND.  Gastrointestinal: Negative for abdominal pain, diarrhea, nausea and vomiting.  Genitourinary: Negative for frequency and urgency.  Musculoskeletal: Negative for back pain and joint pain.  Neurological: Positive for weakness. Negative for sensory change, speech change and focal weakness.  Psychiatric/Behavioral: Negative for depression and hallucinations. The patient is not nervous/anxious.    Tolerating Diet:yes Tolerating PT: pending  DRUG ALLERGIES:   Allergies  Allergen Reactions  . Lyrica [Pregabalin] Other (See Comments)    Reaction: made Restless Leg Syndrome worse     VITALS:  Blood pressure (!) 158/89, pulse 75, temperature 98.4 F (36.9 C), temperature source Oral, resp. rate (!) 25, height 5\' 7"  (1.702 m), weight 119 kg (262 lb 5.6 oz), SpO2 99 %.  PHYSICAL EXAMINATION:   Physical Exam  GENERAL:  71 y.o.-year-old patient lying in the bed with mild acute distress. Obese EYES: Pupils equal, round, reactive to light and accommodation. No scleral icterus. Extraocular muscles intact.  HEENT: Head atraumatic, normocephalic. Oropharynx and nasopharynx clear. BIPAP+ NECK:  Supple, no jugular venous distention. No thyroid enlargement, no tenderness.  LUNGS:distant breath sounds bilaterally, no wheezing, rales, rhonchi. No use  of accessory muscles of respiration.  CARDIOVASCULAR: S1, S2 normal. No murmurs, rubs, or gallops.  ABDOMEN: Soft, nontender, nondistended. Bowel sounds present. No organomegaly or mass.  EXTREMITIES: No cyanosis, clubbing or edema b/l.    NEUROLOGIC: Cranial nerves II through XII are intact. No focal Motor or sensory deficits b/l.   PSYCHIATRIC:  patient is alert and oriented x 3.  SKIN: No obvious rash, lesion, or ulcer.   LABORATORY PANEL:  CBC  Recent Labs Lab 07/05/16 0345  WBC 13.4*  HGB 14.4  HCT 44.7  PLT 140*    Chemistries   Recent Labs Lab 07/02/16 1127  07/04/16 0419 07/05/16 0345  NA 134*  < > 136  --   K 4.4  < > 4.6  --   CL 97*  < > 100*  --   CO2 28  < > 27  --   GLUCOSE 95  < > 94  --   BUN 58*  < > 66*  --   CREATININE 2.92*  < > 2.08* 1.10*  CALCIUM 8.1*  < > 8.3*  --   AST 25  --   --   --   ALT 14  --   --   --   ALKPHOS 108  --   --   --   BILITOT 1.0  --   --   --   < > = values in this interval not displayed. Cardiac Enzymes  Recent Labs Lab 07/02/16 1127  TROPONINI 0.06*   RADIOLOGY:  Dg Chest Port 1 View  Result Date: 07/04/2016 CLINICAL DATA:  Respiratory failure EXAM: PORTABLE CHEST 1 VIEW COMPARISON:  07/02/2016 FINDINGS: Cardiac shadow is again mildly enlarged but  stable. Diffuse right upper lobe infiltrate is seen unchanged from the prior exam. Left lung remains clear. No new bony abnormality is noted. Postsurgical changes in left shoulder are noted. Postsurgical changes are noted in the cervical spine. Loose screws are noted inferiorly but stable from multiple previous exams. IMPRESSION: Stable right upper lobe infiltrate. Electronically Signed   By: Inez Catalina M.D.   On: 07/04/2016 07:33   ASSESSMENT AND PLAN:  Hannah Powell a 71 y.o.femalepresented with respiratory distress. She stated on Thursday she started feeling bad. She needed to increase her oxygen from 2 L to 3 L. On Saturday she had shaking chills and fever.    1. Acute respiratory failure with hypoxia and acidosis. - Patient is back on BiPAP  -1/2 BC GPC  2. Clinical sepsis with Right-sided pneumonia, leukocytosis and tachypnea. - will continue vanc and rocephin -  MRSA PCR negative  -BC positive for strept and staph -case d/w ID  3. Acute kidney injury. - Gentle IV fluid hydration with history of congestive heart failure. Hold Zaroxolyn and losartan -creat improving Baseline creat 0.75 in 01/2016 Came in with 2.53--- 2.08  4. COPD on chronic oxygen. Ordered nebulizer treatments and change to oral steroids  5. Sleep apnea on CPAP at night   6. History of diastolic congestive heart failure. This does not seem like heart failure at this point.  7. Essential hypertension blood pressure on the lower side. IV fluid hydration  8. Hypothyroidism unspecified on levothyroxine  9. Restless leg syndrome on Mirapex  Case discussed with Care Management/Social Worker. Management plans discussed with the patient, family and they are in agreement.  CODE STATUS: DNR  DVT Prophylaxis: lovenox  TOTAL TIME TAKING CARE OF THIS PATIENT: *30 minutes.  >50% time spent on counselling and coordination of care  POSSIBLE D/C IN1DAYS, DEPENDING ON CLINICAL CONDITION.  Note: This dictation was prepared with Dragon dictation along with smaller phrase technology. Any transcriptional errors that result from this process are unintentional.  Gunner Iodice M.D on 07/05/2016 at 3:17 PM  Between 7am to 6pm - Pager - [phone]  After 6pm go to [email or web address] - password [email or web address] [facility]  Office  [phone]  CC: Primary care physician; Adin Hector, MD

## 2016-07-05 NOTE — Progress Notes (Signed)
Patient alert with much confusion. Patient was going between using Bi-Pap and using [place] at 3LPM per patient comfort. Family in visiting all day. MD aware as well regarding patient's increase in confusion and repeating statements. Continent of bladder. Both IV sites went bad and she now has two new IV sites. One on each of her arms. Tolerating IVF well. Safety maintained. Will continue to monitor.

## 2016-07-05 NOTE — Progress Notes (Signed)
Pt's blood sugar dropped again at 6am check.  Two cups of orange juice were given.  Will recheck blood sugar in 15 min.

## 2016-07-05 NOTE — Progress Notes (Signed)
Blood sugar was taken at 00:00 with the result of 56.  Repeat was 63.  One amp of D50 was given and sugars increased to 100.  Pt remains on Bipap.

## 2016-07-05 NOTE — Plan of Care (Signed)
Problem: Safety: Goal: Ability to remain free from injury will improve Outcome: Progressing Reminded patient to use call bell and not attempt to get out of bed without assistance.

## 2016-07-05 NOTE — Consult Note (Signed)
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 On admit her wbc was 36 but no fevers. CXR showed impressive PNA and she was admitted with acute on chronic Resp failure. Since admission she was placed on Bipap, Sputum cx + strep PNA and bcx + CNS. SHe has no fever and wbc down to 13.  Remains on Bipap but family at bedside reports some continued confusion.    Past Medical History:  Diagnosis Date  . Anxiety   . Arthritis   . CHF (congestive heart failure) ([place])   . COPD (chronic obstructive pulmonary disease) ([place])   . Hypertension    dr Otho Najjar     . Hypothyroidism   . RLS (restless legs syndrome)   . Shortness of breath   . Sleep apnea    cpap      >5 yrs   Past Surgical History:  Procedure Laterality Date  . BACK SURGERY     cervical  . BREAST BIOPSY Left    needle bx-neg  . FOOT ARTHROPLASTY    . JOINT REPLACEMENT     x2 tkr  . TOTAL SHOULDER ARTHROPLASTY Left 07/02/2013   Procedure: LEFT TOTAL SHOULDER ARTHROPLASTY;  Surgeon: Marin Shutter, MD;  Location: [facility];  Service: Orthopedics;  Laterality: Left;   Social History  Substance Use Topics  . Smoking status: Former Smoker    Packs/day: 1.00    Years: 10.00    Quit date: 03/20/1975  . Smokeless tobacco: Never Used  . Alcohol use Yes     Comment: occ   Family History  Problem Relation Age of Onset  . Hypertension Mother   . Diabetes Mother   . Heart failure Father   . Breast cancer Neg Hx     Allergies:  Allergies  Allergen Reactions  . Lyrica [Pregabalin] Other (See Comments)    Reaction: made Restless Leg Syndrome worse     Current antibiotics: Antibiotics Given (last 72 hours)     Date/Time Action Medication Dose Rate   07/03/16 1052 Given   azithromycin (ZITHROMAX) 500 mg in dextrose 5 % 250 mL IVPB 500 mg 250 mL/hr   07/03/16 1853 Given   ceFEPIme (MAXIPIME) 2 g in dextrose 5 % 50 mL IVPB 2 g 100 mL/hr   07/04/16 0946 Given   azithromycin (ZITHROMAX) tablet 250 mg 250 mg       MEDICATIONS: . aspirin EC  81 mg Oral Daily  . budesonide (PULMICORT) nebulizer solution  0.25 mg Nebulization Q6H  . chlorhexidine  15 mL Mouth Rinse BID  . heparin  5,000 Units Subcutaneous Q8H  . ipratropium-albuterol  3 mL Nebulization Q6H  . levothyroxine  200 mcg Oral QAC breakfast  . pramipexole  4 mg Oral QHS    Review of Systems - 11 systems reviewed and negative per HPI   OBJECTIVE: Temp:  [97.9 F (36.6 C)-98.5 F (36.9 C)] 97.9 F (36.6 C) (04/19 1400) Pulse Rate:  [68-89] 80 (04/19 1800) Resp:  [14-36] 20 (04/19 1800) BP: (121-162)/(66-144) 143/78 (04/19 1800) SpO2:  [93 %-100 %] 100 % (04/19 2118) FiO2 (%):  [35 %] 35 % (04/19 2118)  Physical Exam  Constitutional:  Obese, on bipap with some labored breathing, able to converse HENT: [place]/AT, PERRLA, no scleral icterus Mouth/Throat: Oropharynx is clear and moist. No oropharyngeal exudate.  Cardiovascular: Normal rate, regular rhythm and normal heart sounds. Exam reveals no gallop .  Pulmonary/Chest: poor air movement, rhonchi Neck = supple, no nuchal rigidity Abdominal: Soft. Bowel sounds are normal.  exhibits no distension. There is no tenderness.  Lymphadenopathy: no cervical adenopathy. No axillary adenopathy Neurological: alert and oriented to person, place, and time.  Skin: Skin is warm and dry. No rash noted. No erythema.  Psychiatric: a normal mood and affect.  behavior is normal.    LABS: Results for orders placed or performed during the hospital encounter of 07/02/16 (from the past 48 hour(s))  Procalcitonin     Status: None   Collection Time: 07/04/16  4:19 AM  Result Value Ref Range    Procalcitonin 4.81 ng/mL    Comment:        Interpretation: PCT > 2 ng/mL: Systemic infection (sepsis) is likely, unless other causes are known. (NOTE)         ICU PCT Algorithm               Non ICU PCT Algorithm    ----------------------------     ------------------------------         PCT < 0.25 ng/mL                 PCT < 0.1 ng/mL     Stopping of antibiotics            Stopping of antibiotics       strongly encouraged.               strongly encouraged.    ----------------------------     ------------------------------       PCT level decrease by               PCT < 0.25 ng/mL       >= 80% from peak PCT       OR PCT 0.25 - 0.5 ng/mL          Stopping of antibiotics                                             encouraged.     Stopping of antibiotics           encouraged.    ----------------------------     ------------------------------       PCT level decrease by              PCT >= 0.25 ng/mL       < 80% from peak PCT        AND PCT >= 0.5 ng/mL            Continuing antibiotics                                               encouraged.       Continuing antibiotics            encouraged.    ----------------------------     ------------------------------     PCT level increase compared  PCT > 0.5 ng/mL         with peak PCT AND          PCT >= 0.5 ng/mL             Escalation of antibiotics                                          strongly encouraged.      Escalation of antibiotics        strongly encouraged.   Basic metabolic panel     Status: Abnormal   Collection Time: 07/04/16  4:19 AM  Result Value Ref Range   Sodium 136 135 - 145 mmol/L   Potassium 4.6 3.5 - 5.1 mmol/L   Chloride 100 (L) 101 - 111 mmol/L   CO2 27 22 - 32 mmol/L   Glucose, Bld 94 65 - 99 mg/dL   BUN 66 (H) 6 - 20 mg/dL   Creatinine, Ser 2.08 (H) 0.44 - 1.00 mg/dL   Calcium 8.3 (L) 8.9 - 10.3 mg/dL   GFR calc non Af Amer 23 (L) >60 mL/min   GFR calc Af Amer 27 (L) >60 mL/min    Comment:  (NOTE) The eGFR has been calculated using the CKD EPI equation. This calculation has not been validated in all clinical situations. eGFR's persistently <60 mL/min signify possible Chronic Kidney Disease.    Anion gap 9 5 - 15  Blood gas, arterial     Status: Abnormal   Collection Time: 07/04/16 11:55 AM  Result Value Ref Range   FIO2 0.24    Delivery systems NASAL CANNULA    pH, Arterial 7.15 (LL) 7.350 - 7.450    Comment: CRITICAL RESULT CALLED TO, READ BACK BY AND VERIFIED WITH: CRITICAL VALUE 07/04/16,1240,DANA BLAKENEY,NP/FD    pCO2 arterial 77 (HH) 32.0 - 48.0 mmHg    Comment: CRITICAL RESULT CALLED TO, READ BACK BY AND VERIFIED WITH: CRITICAL VALUE 07/04/16,1240,DANA BLAKENEY,NP/FD    pO2, Arterial 58 (L) 83.0 - 108.0 mmHg   Bicarbonate 26.8 20.0 - 28.0 mmol/L   Acid-base deficit 4.1 (H) 0.0 - 2.0 mmol/L   O2 Saturation 80.2 %   Patient temperature 37.0    Collection site RIGHT BRACHIAL    Sample type ARTERIAL DRAW    [place]ens test (pass/fail) PASS PASS  Glucose, capillary     Status: None   Collection Time: 07/04/16  2:28 PM  Result Value Ref Range   Glucose-Capillary 76 65 - 99 mg/dL  Glucose, capillary     Status: Abnormal   Collection Time: 07/04/16 11:54 PM  Result Value Ref Range   Glucose-Capillary 56 (L) 65 - 99 mg/dL  Glucose, capillary     Status: Abnormal   Collection Time: 07/04/16 11:56 PM  Result Value Ref Range   Glucose-Capillary 63 (L) 65 - 99 mg/dL  Glucose, capillary     Status: Abnormal   Collection Time: 07/05/16 12:49 AM  Result Value Ref Range   Glucose-Capillary 100 (H) 65 - 99 mg/dL  Glucose, capillary     Status: None   Collection Time: 07/05/16  3:07 AM  Result Value Ref Range   Glucose-Capillary 82 65 - 99 mg/dL  CBC     Status: Abnormal   Collection Time: 07/05/16  3:45 AM  Result Value Ref Range   WBC 13.4 (H) 3.6 - 11.0 K/uL   RBC 4.77 3.80 -  5.20 MIL/uL   Hemoglobin 14.4 12.0 - 16.0 g/dL   HCT 44.7 35.0 - 47.0 %   MCV 93.7 80.0  - 100.0 fL   MCH 30.1 26.0 - 34.0 pg   MCHC 32.1 32.0 - 36.0 g/dL   RDW 16.6 (H) 11.5 - 14.5 %   Platelets 140 (L) 150 - 440 K/uL  Creatinine, serum     Status: Abnormal   Collection Time: 07/05/16  3:45 AM  Result Value Ref Range   Creatinine, Ser 1.10 (H) 0.44 - 1.00 mg/dL   GFR calc non Af Amer 50 (L) >60 mL/min   GFR calc Af Amer 58 (L) >60 mL/min    Comment: (NOTE) The eGFR has been calculated using the CKD EPI equation. This calculation has not been validated in all clinical situations. eGFR's persistently <60 mL/min signify possible Chronic Kidney Disease.   Glucose, capillary     Status: None   Collection Time: 07/05/16  6:04 AM  Result Value Ref Range   Glucose-Capillary 68 65 - 99 mg/dL  Glucose, capillary     Status: None   Collection Time: 07/05/16  6:36 AM  Result Value Ref Range   Glucose-Capillary 74 65 - 99 mg/dL  Glucose, capillary     Status: Abnormal   Collection Time: 07/05/16 11:23 AM  Result Value Ref Range   Glucose-Capillary 102 (H) 65 - 99 mg/dL  Glucose, capillary     Status: None   Collection Time: 07/05/16  5:57 PM  Result Value Ref Range   Glucose-Capillary 76 65 - 99 mg/dL   No components found for: ESR, C REACTIVE PROTEIN MICRO: Recent Results (from the past 720 hour(s))  Urine culture     Status: Abnormal   Collection Time: 06/30/16  3:50 PM  Result Value Ref Range Status   Specimen Description URINE, CLEAN CATCH  Final   Special Requests Normal  Final   Culture MULTIPLE SPECIES PRESENT, SUGGEST RECOLLECTION (A)  Final   Report Status 07/02/2016 FINAL  Final  Blood culture (routine x 2)     Status: Abnormal (Preliminary result)   Collection Time: 07/02/16 11:47 AM  Result Value Ref Range Status   Specimen Description BLOOD LEFT ARM  Final   Special Requests   Final    BOTTLES DRAWN AEROBIC AND ANAEROBIC Blood Culture results may not be optimal due to an excessive volume of blood received in culture bottles   Culture  Setup Time   Final     GRAM POSITIVE COCCI IN CLUSTERS ANAEROBIC BOTTLE ONLY CRITICAL VALUE NOTED.  VALUE IS CONSISTENT WITH PREVIOUSLY REPORTED AND CALLED VALUE. Performed at [facility], [place] [address], [place], [place] [address]    Culture STAPHYLOCOCCUS CAPITIS (A)  Final   Report Status PENDING  Incomplete  Blood culture (routine x 2)     Status: Abnormal (Preliminary result)   Collection Time: 07/02/16 11:47 AM  Result Value Ref Range Status   Specimen Description BLOOD RIGHT ARM  Final   Special Requests   Final    BOTTLES DRAWN AEROBIC AND ANAEROBIC Blood Culture adequate volume   Culture  Setup Time   Final    GRAM POSITIVE COCCI IN BOTH AEROBIC AND ANAEROBIC BOTTLES CRITICAL RESULT CALLED TO, READ BACK BY AND VERIFIED WITH: SHEEMA HALLAJI 07/03/16 @ 17  [place]    Culture STAPHYLOCOCCUS SPECIES (COAGULASE NEGATIVE) (A)  Final   Report Status PENDING  Incomplete  Blood Culture ID Panel (Reflexed)     Status: Abnormal  Collection Time: 07/02/16 11:47 AM  Result Value Ref Range Status   Enterococcus species NOT DETECTED NOT DETECTED Final   Listeria monocytogenes NOT DETECTED NOT DETECTED Final   Staphylococcus species DETECTED (A) NOT DETECTED Final    Comment: Methicillin (oxacillin) resistant coagulase negative staphylococcus. Possible blood culture contaminant (unless isolated from more than one blood culture draw or clinical case suggests pathogenicity). No antibiotic treatment is indicated for blood  culture contaminants. CRITICAL RESULT CALLED TO, READ BACK BY AND VERIFIED WITH: SHEEMA HALLAJI 07/03/16 @ 8588  [place]    Staphylococcus aureus NOT DETECTED NOT DETECTED Final   Methicillin resistance DETECTED (A) NOT DETECTED Final    Comment: CRITICAL RESULT CALLED TO, READ BACK BY AND VERIFIED WITH: SHEEMA HALLAJI 07/03/16 @ 1725  [facility]    Streptococcus species NOT DETECTED NOT DETECTED Final   Streptococcus agalactiae NOT DETECTED NOT DETECTED Final   Streptococcus pneumoniae NOT  DETECTED NOT DETECTED Final   Streptococcus pyogenes NOT DETECTED NOT DETECTED Final   Acinetobacter baumannii NOT DETECTED NOT DETECTED Final   Enterobacteriaceae species NOT DETECTED NOT DETECTED Final   Enterobacter cloacae complex NOT DETECTED NOT DETECTED Final   Escherichia coli NOT DETECTED NOT DETECTED Final   Klebsiella oxytoca NOT DETECTED NOT DETECTED Final   Klebsiella pneumoniae NOT DETECTED NOT DETECTED Final   Proteus species NOT DETECTED NOT DETECTED Final   Serratia marcescens NOT DETECTED NOT DETECTED Final   Haemophilus influenzae NOT DETECTED NOT DETECTED Final   Neisseria meningitidis NOT DETECTED NOT DETECTED Final   Pseudomonas aeruginosa NOT DETECTED NOT DETECTED Final   Candida albicans NOT DETECTED NOT DETECTED Final   Candida glabrata NOT DETECTED NOT DETECTED Final   Candida krusei NOT DETECTED NOT DETECTED Final   Candida parapsilosis NOT DETECTED NOT DETECTED Final   Candida tropicalis NOT DETECTED NOT DETECTED Final  MRSA PCR Screening     Status: None   Collection Time: 07/02/16  3:14 PM  Result Value Ref Range Status   MRSA by PCR NEGATIVE NEGATIVE Final    Comment:        The GeneXpert MRSA Assay (FDA approved for NASAL specimens only), is one component of a comprehensive MRSA colonization surveillance program. It is not intended to diagnose MRSA infection nor to guide or monitor treatment for MRSA infections.   Culture, expectorated sputum-assessment     Status: None   Collection Time: 07/03/16 12:23 AM  Result Value Ref Range Status   Specimen Description SPUTUM  Final   Special Requests NONE  Final   Sputum evaluation THIS SPECIMEN IS ACCEPTABLE FOR SPUTUM CULTURE  Final   Report Status 07/03/2016 FINAL  Final  Culture, respiratory (NON-Expectorated)     Status: None   Collection Time: 07/03/16 12:23 AM  Result Value Ref Range Status   Specimen Description SPUTUM  Final   Special Requests NONE Reflexed from [id number]  Final   Gram Stain    Final    MODERATE WBC PRESENT, PREDOMINANTLY PMN RARE YEAST FEW GRAM POSITIVE COCCI IN PAIRS Performed at [facility], [address], [place], [place] [address]    Culture FEW STREPTOCOCCUS PNEUMONIAE  Final   Report Status 07/05/2016 FINAL  Final   Organism ID, Bacteria STREPTOCOCCUS PNEUMONIAE  Final      Susceptibility   Streptococcus pneumoniae - MIC*    ERYTHROMYCIN >=8 RESISTANT Resistant     LEVOFLOXACIN >=16 RESISTANT Resistant     PENICILLIN (meningitis) <=0.06 SENSITIVE Sensitive     PENICILLIN (non-meningitis) <=  0.06 SENSITIVE Sensitive     PENICILLIN (oral) <=0.06 SENSITIVE Sensitive     CEFTRIAXONE (non-meningitis) <=0.12 SENSITIVE Sensitive     CEFTRIAXONE (meningitis) <=0.12 SENSITIVE Sensitive     * FEW STREPTOCOCCUS PNEUMONIAE    IMAGING: Dg Chest Port 1 View  Result Date: 07/04/2016 CLINICAL DATA:  Respiratory failure EXAM: PORTABLE CHEST 1 VIEW COMPARISON:  07/02/2016 FINDINGS: Cardiac shadow is again mildly enlarged but stable. Diffuse right upper lobe infiltrate is seen unchanged from the prior exam. Left lung remains clear. No new bony abnormality is noted. Postsurgical changes in left shoulder are noted. Postsurgical changes are noted in the cervical spine. Loose screws are noted inferiorly but stable from multiple previous exams. IMPRESSION: Stable right upper lobe infiltrate. Electronically Signed   By: Inez Catalina M.D.   On: 07/04/2016 07:33   Dg Chest Portable 1 View  Result Date: 07/02/2016 CLINICAL DATA:  Shortness of breath since this morning. EXAM: PORTABLE CHEST 1 VIEW COMPARISON:  Chest x-ray 07/19/2015 and report from prior chest x-ray 05/29/2016. FINDINGS: The heart is enlarged but stable. There is mild tortuosity and ectasia of the thoracic aorta. Fairly extensive opacification of the right hemithorax possibly a combination of polylobar pneumonia and pleural effusion. Left basilar atelectasis but no definite left lung infiltrates. The bony  thorax is intact. Stable cervical fusion hardware and left shoulder prosthesis. IMPRESSION: Fairly extensive airspace opacification involving the right hemithorax. This is most likely polylobar pneumonia and pleural effusion. Tumor is felt to be unlikely given x-ray report of 05/29/2016. Followup PA and lateral chest X-ray is recommended in 3-4 weeks following trial of antibiotic therapy to ensure resolution and exclude underlying malignancy. Electronically Signed   By: Marijo Sanes M.D.   On: 07/02/2016 12:12    Assessment:   Nakema Fake is a 71 y.o. female with mod-severe COPD on Home O2 admitted with PNA with cx from sputum + Strep PNA and BCX 2/2 + CNS.  She is on Bipap, wbc improving with ctx and azithromycin. She has no PPM or intravascular devices. [place] despite being 2/2 + seem to have been drawn at the same time and I suspect they were from the same blood draw. She is not really at risk of coag neg staph bacteremia with no PPM, AICD or Portacath. One set is growing staph capitis, the other is stilll pending   Recommendations Cont ctx Can dc azitro If the second set of bcx is a different form of coag neg staph can dc vanco Repeat bcx ordered Thank you very much for allowing me to participate in the care of this patient. Please call with questions.   Cheral Marker. Ola Spurr, MD

## 2016-07-05 NOTE — Progress Notes (Signed)
Per Hinton Dyer NP placed patient on 3 liters and removed bipap. Orders to allow patient to eat and drink. If needed orders to place patient back if she becomes in distress. Will monitor.

## 2016-07-05 NOTE — Care Management Note (Signed)
Case Management Note  Patient Details  Name: Hannah Powell MRN: [id number] Date of Birth: 1945-09-14  Subjective/Objective:                  Patient's husband called this RNCM regarding discharge planning. Patient typically ambulates with cane (for past two years) but still has her front-wheeled walker. He states that patient was followed by home health agency after knee surgery with Dr. Marry Guan in the past. He thinks it was [facility] (now [facility]) and would like them again. She is on home O2 through [place]. She has a CPAP that she wears with O2 at night per husband. He would like to talk to Pulmonologist regarding benefits of NIV. Cost for NIV would be around $200/month with insurance approval and would need documentation that "patient needs the non-invasive vent due to chronic respiratory failure secondary to COPD" (if appropriate).   Action/Plan: [facility]. Referral to [facility] for [place]. RNCM will continue to follow.  Expected Discharge Date:                  Expected Discharge Plan:     In-House Referral:     Discharge planning Services  CM Consult  Post Acute Care Choice:  Durable Medical Equipment, Home Health Choice offered to:  Spouse, Patient  DME Arranged:  NIV DME Agency:  [place]:  PT Homeland:  [facility] (now [facility])  Status of Service:  In process, will continue to follow  If discussed at Long Length of Stay Meetings, dates discussed:    Additional Comments:  Marshell Garfinkel, RN 07/05/2016, 1:39 PM

## 2016-07-05 NOTE — H&P (Signed)
Dr Leonidas Romberg has been made aware of patients speech delays and mimicking. At this point no additional orders. Patient is moving all extremities. Continue to monitor.

## 2016-07-05 NOTE — Care Management (Signed)
PT is recommending HHPT. Patient may qualify for home NIV also. Corene Cornea with [facility] has sent qualification guide and I have shared with Hinton Dyer NP for assessment. I have left message for patient's husband to discuss discharge planning. ID is also following patient.

## 2016-07-05 NOTE — Progress Notes (Signed)
PULMONARY / CRITICAL CARE MEDICINE   Name: Hannah Powell MRN: [id number] DOB: 1945-11-27    ADMISSION DATE:  07/02/2016 CONSULTATION DATE: 07/02/16  REFERRING MD: Earleen Newport  PT PROFILE: 71 y.o. F former smoker pt of pulmonologist Dr. Mortimer Fries admitted 04/16 with acute on chronic hypercapnic hypoxic respiratory failure secondary to AECOPD and right sided pneumonia requiring continuous BIpap.   SIGNIFICANT EVENTS: 04/16-Pt admitted to [facility] Unit due to need of continuous Bipap  04/17-Pt transferred to [facility] unit  04/18-Pt transferred back to [facility] Unit due to acute on chronic hypercapnic respiratory failure requiring continuous BIpap   REVIEW OF SYSTEMS:  Unable to assess pt lethargic  SUBJECTIVE:  Remains slightly confused but alert and interactive. Seemingly some difficulty with word finding  VITAL SIGNS: BP (!) 153/89 (BP Location: Right Arm)   Pulse 87   Temp 98.4 F (36.9 C) (Oral)   Resp (!) 23   Ht 5\' 7"  (1.702 m)   Wt 119 kg (262 lb 5.6 oz)   SpO2 94%   BMI 41.09 kg/m   HEMODYNAMICS:    VENTILATOR SETTINGS: FiO2 (%):  [35 %] 35 %  INTAKE / OUTPUT: I/O last 3 completed shifts: In: 50 [IV Piggyback:50] Out: 950 [Urine:950]  PHYSICAL EXAMINATION: General: Mildly dyspneic, tachypneic off BiPAP  Neuro: RASS 0, no focal deficits, CNs intact, DTRs symmetric HEENT: supple, no JVD  Cardiovascular: Reg, no M Lungs: Rhonchi on R, few scattered wheezes Abdomen: Obese, soft, +BS Extremities: Warm, 1+ bilateral lower extremity edema  Skin: no rashes or lesions present   LABS:  BMET  Recent Labs Lab 07/02/16 1127 07/03/16 0334 07/04/16 0419 07/05/16 0345  NA 134* 136 136  --   K 4.4 5.1 4.6  --   CL 97* 101 100*  --   CO2 28 25 27   --   BUN 58* 62* 66*  --   CREATININE 2.92* 2.53* 2.08* 1.10*  GLUCOSE 95 151* 94  --     Electrolytes  Recent Labs Lab 07/02/16 1127 07/03/16 0334 07/04/16 0419  CALCIUM 8.1* 7.6* 8.3*    CBC  Recent  Labs Lab 07/02/16 1127 07/03/16 0334 07/05/16 0345  WBC 36.7* 35.4* 13.4*  HGB 13.7 13.3 14.4  HCT 43.4 41.5 44.7  PLT 165 172 140*    Coag's No results for input(s): APTT, INR in the last 168 hours.  Sepsis Markers  Recent Labs Lab 07/02/16 1127 07/02/16 2306 07/03/16 0334 07/04/16 0419  LATICACIDVEN 1.5 1.5  --   --   PROCALCITON 14.57  --  8.82 4.81    ABG  Recent Labs Lab 07/04/16 1155  PHART 7.15*  PCO2ART 77*  PO2ART 58*    Liver Enzymes  Recent Labs Lab 07/02/16 1127  AST 25  ALT 14  ALKPHOS 108  BILITOT 1.0  ALBUMIN 3.1*    Cardiac Enzymes  Recent Labs Lab 07/02/16 1127  TROPONINI 0.06*    Glucose  Recent Labs Lab 07/04/16 2356 07/05/16 0049 07/05/16 0307 07/05/16 0604 07/05/16 0636 07/05/16 1123  GLUCAP 63* 100* 82 68 74 102*   Results for orders placed or performed during the hospital encounter of 07/02/16  Blood culture (routine x 2)     Status: None (Preliminary result)   Collection Time: 07/02/16 11:47 AM  Result Value Ref Range Status   Specimen Description BLOOD LEFT ARM  Final   Special Requests   Final    BOTTLES DRAWN AEROBIC AND ANAEROBIC Blood Culture results may not be optimal due to an  excessive volume of blood received in culture bottles   Culture  Setup Time   Final    GRAM POSITIVE COCCI IN CLUSTERS ANAEROBIC BOTTLE ONLY CRITICAL VALUE NOTED.  VALUE IS CONSISTENT WITH PREVIOUSLY REPORTED AND CALLED VALUE. Performed at [facility], [place] [address] [place], [place] [address]    Culture GRAM POSITIVE COCCI  Final   Report Status PENDING  Incomplete  Blood culture (routine x 2)     Status: Abnormal (Preliminary result)   Collection Time: 07/02/16 11:47 AM  Result Value Ref Range Status   Specimen Description BLOOD RIGHT ARM  Final   Special Requests   Final    BOTTLES DRAWN AEROBIC AND ANAEROBIC Blood Culture adequate volume   Culture  Setup Time   Final    GRAM POSITIVE COCCI IN BOTH AEROBIC AND  ANAEROBIC BOTTLES CRITICAL RESULT CALLED TO, READ BACK BY AND VERIFIED WITH: SHEEMA HALLAJI 07/03/16 @ 66  [place]    Culture STAPHYLOCOCCUS SPECIES (COAGULASE NEGATIVE) (A)  Final   Report Status PENDING  Incomplete  Blood Culture ID Panel (Reflexed)     Status: Abnormal   Collection Time: 07/02/16 11:47 AM  Result Value Ref Range Status   Enterococcus species NOT DETECTED NOT DETECTED Final   Listeria monocytogenes NOT DETECTED NOT DETECTED Final   Staphylococcus species DETECTED (A) NOT DETECTED Final    Comment: Methicillin (oxacillin) resistant coagulase negative staphylococcus. Possible blood culture contaminant (unless isolated from more than one blood culture draw or clinical case suggests pathogenicity). No antibiotic treatment is indicated for blood  culture contaminants. CRITICAL RESULT CALLED TO, READ BACK BY AND VERIFIED WITH: SHEEMA HALLAJI 07/03/16 @ 1027  [place]    Staphylococcus aureus NOT DETECTED NOT DETECTED Final   Methicillin resistance DETECTED (A) NOT DETECTED Final    Comment: CRITICAL RESULT CALLED TO, READ BACK BY AND VERIFIED WITH: SHEEMA HALLAJI 07/03/16 @ 1725  [place]    Streptococcus species NOT DETECTED NOT DETECTED Final   Streptococcus agalactiae NOT DETECTED NOT DETECTED Final   Streptococcus pneumoniae NOT DETECTED NOT DETECTED Final   Streptococcus pyogenes NOT DETECTED NOT DETECTED Final   Acinetobacter baumannii NOT DETECTED NOT DETECTED Final   Enterobacteriaceae species NOT DETECTED NOT DETECTED Final   Enterobacter cloacae complex NOT DETECTED NOT DETECTED Final   Escherichia coli NOT DETECTED NOT DETECTED Final   Klebsiella oxytoca NOT DETECTED NOT DETECTED Final   Klebsiella pneumoniae NOT DETECTED NOT DETECTED Final   Proteus species NOT DETECTED NOT DETECTED Final   Serratia marcescens NOT DETECTED NOT DETECTED Final   Haemophilus influenzae NOT DETECTED NOT DETECTED Final   Neisseria meningitidis NOT DETECTED NOT DETECTED Final   Pseudomonas  aeruginosa NOT DETECTED NOT DETECTED Final   Candida albicans NOT DETECTED NOT DETECTED Final   Candida glabrata NOT DETECTED NOT DETECTED Final   Candida krusei NOT DETECTED NOT DETECTED Final   Candida parapsilosis NOT DETECTED NOT DETECTED Final   Candida tropicalis NOT DETECTED NOT DETECTED Final  MRSA PCR Screening     Status: None   Collection Time: 07/02/16  3:14 PM  Result Value Ref Range Status   MRSA by PCR NEGATIVE NEGATIVE Final    Comment:        The GeneXpert MRSA Assay (FDA approved for NASAL specimens only), is one component of a comprehensive MRSA colonization surveillance program. It is not intended to diagnose MRSA infection nor to guide or monitor treatment for MRSA infections.   Culture, expectorated sputum-assessment  Status: None   Collection Time: 07/03/16 12:23 AM  Result Value Ref Range Status   Specimen Description SPUTUM  Final   Special Requests NONE  Final   Sputum evaluation THIS SPECIMEN IS ACCEPTABLE FOR SPUTUM CULTURE  Final   Report Status 07/03/2016 FINAL  Final  Culture, respiratory (NON-Expectorated)     Status: None (Preliminary result)   Collection Time: 07/03/16 12:23 AM  Result Value Ref Range Status   Specimen Description SPUTUM  Final   Special Requests NONE Reflexed from [id number]  Final   Gram Stain   Final    MODERATE WBC PRESENT, PREDOMINANTLY PMN RARE YEAST FEW GRAM POSITIVE COCCI IN PAIRS Performed at [facility], [address] [place], [place] [address]    Culture FEW STREPTOCOCCUS PNEUMONIAE  Final   Report Status PENDING  Incomplete   Anti-infectives    Start     Dose/Rate Route Frequency Ordered Stop   07/05/16 1030  vancomycin (VANCOCIN) IVPB 1000 mg/200 mL premix     1,000 mg 200 mL/hr over 60 Minutes Intravenous  Once 07/05/16 0928 07/05/16 1200   07/05/16 1000  cefTRIAXone (ROCEPHIN) 1 g in dextrose 5 % 50 mL IVPB     1 g 120 mL/hr over 30 Minutes Intravenous Every 24 hours 07/05/16 0942 07/10/16 0959    07/04/16 1500  ceFEPIme (MAXIPIME) 2 g in dextrose 5 % 50 mL IVPB  Status:  Discontinued     2 g 100 mL/hr over 30 Minutes Intravenous Every 12 hours 07/04/16 1346 07/05/16 1032   07/04/16 1000  azithromycin (ZITHROMAX) tablet 250 mg  Status:  Discontinued     250 mg Oral Daily 07/04/16 0742 07/05/16 0942   07/02/16 1800  ceFEPIme (MAXIPIME) 1 GM / 73mL IVPB premix  Status:  Discontinued     1 g 100 mL/hr over 30 Minutes Intravenous Every 24 hours 07/02/16 1342 07/02/16 1342   07/02/16 1800  ceFEPIme (MAXIPIME) 2 g in dextrose 5 % 50 mL IVPB  Status:  Discontinued     2 g 100 mL/hr over 30 Minutes Intravenous Every 24 hours 07/02/16 1343 07/04/16 1346   07/02/16 1400  vancomycin (VANCOCIN) 500 mg in sodium chloride 0.9 % 100 mL IVPB     500 mg 100 mL/hr over 60 Minutes Intravenous  Once 07/02/16 1337 07/02/16 1935   07/02/16 1300  azithromycin (ZITHROMAX) 500 mg in dextrose 5 % 250 mL IVPB  Status:  Discontinued     500 mg 250 mL/hr over 60 Minutes Intravenous Daily 07/02/16 1242 07/04/16 0742   07/02/16 1215  vancomycin (VANCOCIN) IVPB 1000 mg/200 mL premix     1,000 mg 200 mL/hr over 60 Minutes Intravenous  Once 07/02/16 1211 07/02/16 1333   07/02/16 1215  piperacillin-tazobactam (ZOSYN) IVPB 3.375 g     3.375 g 100 mL/hr over 30 Minutes Intravenous  Once 07/02/16 1212 07/02/16 1256       Urine strep antigen: Positive CXR:  NNF   ASSESSMENT / PLAN: Acute on chronic hypoxemic/hypercarbic respiratory failure Moderate to severe COPD Extensive right-sided pneumococcal pneumonia Acute kidney injury- Cr improving  "+" blood cultures for coag neg staph - these appear to be contaminants Hx: OSA-cpap qhs   AMS, acute encephalopathy - suspect due to hypercarbia.  Cont PRN BiPAP Cont supplemental O2 Cont systemic steroids Cont nebulized steroids and bronchodilators Recheck CXR AM 04/20 Monitor BMET intermittently Monitor I/Os Correct electrolytes as indicated DVT px: SQ  heparin Monitor CBC intermittently Transfuse per usual guidelines  Monitor temp, WBC count Micro and abx as above Discussed abx therapy with Dr Ola Spurr If AMS persists, will consider CT head 04/20 Family updated @ bedside  Merton Border, MD PCCM service Mobile [phone] Pager [phone] 07/05/2016 12:09 PM

## 2016-07-06 ENCOUNTER — Inpatient Hospital Stay: Payer: Medicare HMO

## 2016-07-06 ENCOUNTER — Ambulatory Visit: Payer: Self-pay | Admitting: Internal Medicine

## 2016-07-06 DIAGNOSIS — J9601 Acute respiratory failure with hypoxia: Secondary | ICD-10-CM

## 2016-07-06 LAB — GLUCOSE, CAPILLARY
GLUCOSE-CAPILLARY: 84 mg/dL (ref 65–99)
Glucose-Capillary: 101 mg/dL — ABNORMAL HIGH (ref 65–99)
Glucose-Capillary: 109 mg/dL — ABNORMAL HIGH (ref 65–99)
Glucose-Capillary: 114 mg/dL — ABNORMAL HIGH (ref 65–99)

## 2016-07-06 LAB — CULTURE, BLOOD (ROUTINE X 2): Special Requests: ADEQUATE

## 2016-07-06 MED ORDER — LORATADINE 10 MG PO TABS
10.0000 mg | ORAL_TABLET | Freq: Every day | ORAL | Status: DC
  Administered 2016-07-06 – 2016-07-07 (×2): 10 mg via ORAL
  Filled 2016-07-06 (×4): qty 1

## 2016-07-06 MED ORDER — IPRATROPIUM-ALBUTEROL 0.5-2.5 (3) MG/3ML IN SOLN: 3.0000 mL | RESPIRATORY_TRACT | Status: DC | PRN

## 2016-07-06 MED ORDER — TRAZODONE HCL 50 MG PO TABS
100.0000 mg | ORAL_TABLET | Freq: Every day | ORAL | Status: DC
  Administered 2016-07-06 – 2016-07-11 (×6): 100 mg via ORAL
  Filled 2016-07-06 (×5): qty 2
  Filled 2016-07-06: qty 1

## 2016-07-06 MED ORDER — GUAIFENESIN 100 MG/5ML PO SOLN
10.0000 mL | ORAL | Status: DC | PRN
  Administered 2016-07-06 – 2016-07-12 (×9): 200 mg via ORAL
  Filled 2016-07-06 (×14): qty 10

## 2016-07-06 NOTE — Progress Notes (Signed)
[place] INFECTIOUS DISEASE PROGRESS NOTE Date of Admission:  07/02/2016     ID: Hannah Powell is a 71 y.o. female with strep PNA and coag neg staph bacteremia Active Problems:   Acute respiratory failure with hypoxia (HCC)   Pressure injury of skin   Subjective: No fevers, was off bipap for about an hour and a half but now back on. Still with cough.   ROS  Eleven systems are reviewed and negative except per hpi  Medications:  Antibiotics Given (last 72 hours)    Date/Time Action Medication Dose Rate   07/03/16 1853 Given   ceFEPIme (MAXIPIME) 2 g in dextrose 5 % 50 mL IVPB 2 g 100 mL/hr   07/04/16 0946 Given   azithromycin (ZITHROMAX) tablet 250 mg 250 mg      . aspirin EC  81 mg Oral Daily  . budesonide (PULMICORT) nebulizer solution  0.25 mg Nebulization Q6H  . chlorhexidine  15 mL Mouth Rinse BID  . heparin  5,000 Units Subcutaneous Q8H  . ipratropium-albuterol  3 mL Nebulization Q6H  . levothyroxine  200 mcg Oral QAC breakfast  . pramipexole  4 mg Oral QHS  . traZODone  100 mg Oral QHS    Objective: Vital signs in last 24 hours: Temp:  [98.2 F (36.8 C)] 98.2 F (36.8 C) (04/19 2000) Pulse Rate:  [72-86] 72 (04/20 0600) Resp:  [18-36] 31 (04/20 0600) BP: (134-162)/(76-144) 150/119 (04/20 0600) SpO2:  [84 %-100 %] 94 % (04/20 0820) FiO2 (%):  [35 %] 35 % (04/20 0215) Constitutional:  Obese, on bipap with some labored breathing, able to converse HENT: [place]/AT, PERRLA, no scleral icterus Mouth/Throat: Oropharynx is clear and moist. No oropharyngeal exudate.  Cardiovascular: Normal rate, regular rhythm and normal heart sounds. Exam reveals no gallop .  Pulmonary/Chest: poor air movement, rhonchi Neck = supple, no nuchal rigidity Abdominal: Soft. Bowel sounds are normal.  exhibits no distension. There is no tenderness.  Lymphadenopathy: no cervical adenopathy. No axillary adenopathy Neurological: alert and oriented to person, place, and time.  Skin: Skin  is warm and dry. No rash noted. No erythema.  Psychiatric: a normal mood and affect.  behavior is normal.   Lab Results  Recent Labs  07/04/16 0419 07/05/16 0345  WBC  --  13.4*  HGB  --  14.4  HCT  --  44.7  NA 136  --   K 4.6  --   CL 100*  --   CO2 27  --   BUN 66*  --   CREATININE 2.08* 1.10*    Microbiology: Results for orders placed or performed during the hospital encounter of 07/02/16  Blood culture (routine x 2)     Status: Abnormal (Preliminary result)   Collection Time: 07/02/16 11:47 AM  Result Value Ref Range Status   Specimen Description BLOOD LEFT ARM  Final   Special Requests   Final    BOTTLES DRAWN AEROBIC AND ANAEROBIC Blood Culture results may not be optimal due to an excessive volume of blood received in culture bottles   Culture  Setup Time   Final    GRAM POSITIVE COCCI IN CLUSTERS ANAEROBIC BOTTLE ONLY CRITICAL VALUE NOTED.  VALUE IS CONSISTENT WITH PREVIOUSLY REPORTED AND CALLED VALUE.    Culture (A)  Final    STAPHYLOCOCCUS CAPITIS CULTURE REINCUBATED FOR BETTER GROWTH Performed at [facility], [place] [address] [place], [place] [address]    Report Status PENDING  Incomplete   Organism ID,  Bacteria STAPHYLOCOCCUS CAPITIS  Final      Susceptibility   Staphylococcus capitis - MIC*    CIPROFLOXACIN 4 RESISTANT Resistant     ERYTHROMYCIN >=8 RESISTANT Resistant     GENTAMICIN <=0.5 SENSITIVE Sensitive     OXACILLIN >=4 RESISTANT Resistant     TETRACYCLINE <=1 SENSITIVE Sensitive     VANCOMYCIN <=0.5 SENSITIVE Sensitive     TRIMETH/SULFA <=10 SENSITIVE Sensitive     CLINDAMYCIN INTERMEDIATE Intermediate     RIFAMPIN <=0.5 SENSITIVE Sensitive     Inducible Clindamycin NEGATIVE Sensitive     * STAPHYLOCOCCUS CAPITIS  Blood culture (routine x 2)     Status: Abnormal   Collection Time: 07/02/16 11:47 AM  Result Value Ref Range Status   Specimen Description BLOOD RIGHT ARM  Final   Special Requests   Final    BOTTLES DRAWN AEROBIC AND  ANAEROBIC Blood Culture adequate volume   Culture  Setup Time   Final    GRAM POSITIVE COCCI IN BOTH AEROBIC AND ANAEROBIC BOTTLES CRITICAL RESULT CALLED TO, READ BACK BY AND VERIFIED WITH: SHEEMA HALLAJI 07/03/16 @ 62  [place]    Culture (A)  Final    STAPHYLOCOCCUS CAPITIS SUSCEPTIBILITIES PERFORMED ON PREVIOUS CULTURE WITHIN THE LAST 5 DAYS. Performed at [facility], [place] [address], [place], [place] [address]    Report Status 07/06/2016 FINAL  Final  Blood Culture ID Panel (Reflexed)     Status: Abnormal   Collection Time: 07/02/16 11:47 AM  Result Value Ref Range Status   Enterococcus species NOT DETECTED NOT DETECTED Final   Listeria monocytogenes NOT DETECTED NOT DETECTED Final   Staphylococcus species DETECTED (A) NOT DETECTED Final    Comment: Methicillin (oxacillin) resistant coagulase negative staphylococcus. Possible blood culture contaminant (unless isolated from more than one blood culture draw or clinical case suggests pathogenicity). No antibiotic treatment is indicated for blood  culture contaminants. CRITICAL RESULT CALLED TO, READ BACK BY AND VERIFIED WITH: SHEEMA HALLAJI 07/03/16 @ 6712  [place]    Staphylococcus aureus NOT DETECTED NOT DETECTED Final   Methicillin resistance DETECTED (A) NOT DETECTED Final    Comment: CRITICAL RESULT CALLED TO, READ BACK BY AND VERIFIED WITH: SHEEMA HALLAJI 07/03/16 @ 1725  [place]    Streptococcus species NOT DETECTED NOT DETECTED Final   Streptococcus agalactiae NOT DETECTED NOT DETECTED Final   Streptococcus pneumoniae NOT DETECTED NOT DETECTED Final   Streptococcus pyogenes NOT DETECTED NOT DETECTED Final   Acinetobacter baumannii NOT DETECTED NOT DETECTED Final   Enterobacteriaceae species NOT DETECTED NOT DETECTED Final   Enterobacter cloacae complex NOT DETECTED NOT DETECTED Final   Escherichia coli NOT DETECTED NOT DETECTED Final   Klebsiella oxytoca NOT DETECTED NOT DETECTED Final   Klebsiella pneumoniae NOT DETECTED NOT  DETECTED Final   Proteus species NOT DETECTED NOT DETECTED Final   Serratia marcescens NOT DETECTED NOT DETECTED Final   Haemophilus influenzae NOT DETECTED NOT DETECTED Final   Neisseria meningitidis NOT DETECTED NOT DETECTED Final   Pseudomonas aeruginosa NOT DETECTED NOT DETECTED Final   Candida albicans NOT DETECTED NOT DETECTED Final   Candida glabrata NOT DETECTED NOT DETECTED Final   Candida krusei NOT DETECTED NOT DETECTED Final   Candida parapsilosis NOT DETECTED NOT DETECTED Final   Candida tropicalis NOT DETECTED NOT DETECTED Final  MRSA PCR Screening     Status: None   Collection Time: 07/02/16  3:14 PM  Result Value Ref Range Status   MRSA by PCR NEGATIVE NEGATIVE Final  Comment:        The GeneXpert MRSA Assay (FDA approved for NASAL specimens only), is one component of a comprehensive MRSA colonization surveillance program. It is not intended to diagnose MRSA infection nor to guide or monitor treatment for MRSA infections.   Culture, expectorated sputum-assessment     Status: None   Collection Time: 07/03/16 12:23 AM  Result Value Ref Range Status   Specimen Description SPUTUM  Final   Special Requests NONE  Final   Sputum evaluation THIS SPECIMEN IS ACCEPTABLE FOR SPUTUM CULTURE  Final   Report Status 07/03/2016 FINAL  Final  Culture, respiratory (NON-Expectorated)     Status: None   Collection Time: 07/03/16 12:23 AM  Result Value Ref Range Status   Specimen Description SPUTUM  Final   Special Requests NONE Reflexed from [id number]  Final   Gram Stain   Final    MODERATE WBC PRESENT, PREDOMINANTLY PMN RARE YEAST FEW GRAM POSITIVE COCCI IN PAIRS Performed at [facility], [address], [place], [place] [address]    Culture FEW STREPTOCOCCUS PNEUMONIAE  Final   Report Status 07/05/2016 FINAL  Final   Organism ID, Bacteria STREPTOCOCCUS PNEUMONIAE  Final      Susceptibility   Streptococcus pneumoniae - MIC*    ERYTHROMYCIN >=8 RESISTANT Resistant      LEVOFLOXACIN >=16 RESISTANT Resistant     PENICILLIN (meningitis) <=0.06 SENSITIVE Sensitive     PENICILLIN (non-meningitis) <=0.06 SENSITIVE Sensitive     PENICILLIN (oral) <=0.06 SENSITIVE Sensitive     CEFTRIAXONE (non-meningitis) <=0.12 SENSITIVE Sensitive     CEFTRIAXONE (meningitis) <=0.12 SENSITIVE Sensitive     * FEW STREPTOCOCCUS PNEUMONIAE  Culture, blood (single) w Reflex to ID Panel     Status: None (Preliminary result)   Collection Time: 07/05/16 11:23 AM  Result Value Ref Range Status   Specimen Description BLOOD R HAND  Final   Special Requests   Final    BOTTLES DRAWN AEROBIC AND ANAEROBIC Blood Culture adequate volume   Culture NO GROWTH < 24 HOURS  Final   Report Status PENDING  Incomplete     Studies/Results: Dg Chest Port 1 View  Result Date: 07/06/2016 CLINICAL DATA:  Pneumonia. EXAM: PORTABLE CHEST 1 VIEW COMPARISON:  Radiograph of July 04, 2016. FINDINGS: Stable cardiomegaly. No pneumothorax is noted. Increased right upper lobe opacity is noted consistent with pneumonia. Mild bibasilar subsegmental atelectasis or infiltrates are noted. Probable minimal bilateral pleural effusions are noted. Status post left shoulder arthroplasty. Degenerative joint disease is seen involving the right glenohumeral joint. IMPRESSION: Increased right upper lobe opacity is noted consistent with pneumonia. Mild bibasilar subsegmental atelectasis or infiltrates are noted. Electronically Signed   By: Marijo Conception, M.D.   On: 07/06/2016 10:21    Assessment/Plan: Hannah Powell is a 71 y.o. female with mod-severe COPD on Home O2 admitted with PNA with cx from sputum + Strep PNA and BCX 2/2 + CNS (Staph capitis).  She is on Bipap, wbc improving with ctx and azithromycin. Vanco added 4/19. She has no PPM or intravascular devices. [place] despite being 2/2 + seem to have been drawn at the same time and I suspect they were from the same blood draw. She is not really at risk of coag neg  staph bacteremia with no PPM, AICD or Portacath.  FU BCX is negative.  CXR still with impressive RUL changes Day 5 of effective abx therapy Recommendations Cont ceftriaxone.  Would rec a 10 day course of  treatment but does not have to be all IV. Since levo and azithro resistant best option would be oral penicillin when ready for DC  I would not suggest targeting treatment for the Coag negative staph so will dc vancomycin  Thank you very much for the consult. Will follow with you.  Caya Soberanis P   07/06/2016, 2:24 PM

## 2016-07-06 NOTE — Progress Notes (Signed)
PULMONARY / CRITICAL CARE MEDICINE   Name: Hannah Powell MRN: [id number] DOB: 1945/08/28    ADMISSION DATE:  07/02/2016 CONSULTATION DATE: 07/02/16  REFERRING MD: Earleen Newport  PT PROFILE: 71 y.o. F former smoker pt of pulmonologist Dr. Mortimer Fries admitted 04/16 with acute on chronic hypercapnic hypoxic respiratory failure secondary to AECOPD and right sided pneumonia requiring continuous BIpap.   SIGNIFICANT EVENTS: 04/16-Pt admitted to [facility] due to need of continuous Bipap  04/17-Pt transferred to [facility] unit  04/18-Pt transferred back to [facility] Unit due to acute on chronic hypercapnic respiratory failure requiring continuous BIpap   REVIEW OF SYSTEMS: Positives in BOLD Gen: Denies fever, chills, weight change, fatigue, night sweats HEENT: Denies blurred vision, double vision, hearing loss, tinnitus, sinus congestion, rhinorrhea, sore throat, neck stiffness, dysphagia PULM: Intermittent shortness of breath, cough, sputum production, hemoptysis, wheezing CV: Denies chest pain, edema, orthopnea, paroxysmal nocturnal dyspnea, palpitations GI: Denies abdominal pain, nausea, vomiting, diarrhea, hematochezia, melena, constipation, change in bowel habits GU: Denies dysuria, hematuria, polyuria, oliguria, urethral discharge Endocrine: Denies hot or cold intolerance, polyuria, polyphagia or appetite change Derm: Denies rash, dry skin, scaling or peeling skin change Heme: Denies easy bruising, bleeding, bleeding gums Neuro: Intermittent confusion,  headache, numbness, weakness, slurred speech, loss of memory or consciousness  SUBJECTIVE:  Mentation improved today, however still requesting Bipap intermittently due to shortness of breath   VITAL SIGNS: BP 139/84   Pulse 74   Temp 98.2 F (36.8 C) (Oral)   Resp (!) 36   Ht 5\' 7"  (1.702 m)   Wt 119 kg (262 lb 5.6 oz)   SpO2 93%   BMI 41.09 kg/m   HEMODYNAMICS:    VENTILATOR SETTINGS: FiO2 (%):  [35 %] 35 %  INTAKE /  OUTPUT: I/O last 3 completed shifts: In: 1797.5 [P.O.:240; I.V.:1507.5; IV Piggyback:50] Out: 2400 [Urine:2400]  PHYSICAL EXAMINATION: General: obese Caucasian female NAD  Neuro: RASS 0, no focal deficits, CNs intact, DTRs symmetric HEENT: supple, no JVD  Cardiovascular: Reg, no M Lungs: Rhonchi on R, few scattered wheezes, even, non labored  Abdomen: Obese, soft, +BS x4, non tender, non distended  Extremities: Warm, 1+ bilateral lower extremity edema  Skin: no rashes or lesions present   LABS: Reviewed   Urine strep antigen: Positive CXR:  NNF  ASSESSMENT / PLAN: Acute on chronic hypoxemic/hypercarbic respiratory failure Moderate to severe COPD Extensive right-sided pneumococcal pneumonia Acute kidney injury- Cr improving  "+" blood cultures for coag neg staph - these appear to be contaminants AMS, acute encephalopathy - suspect due to hypercarbia and may be component of ICU delirium Hx: OSA-cpap qhs   P: Cont PRN BiPAP  Cont supplemental O2 Cont systemic steroids Cont nebulized steroids and prn bronchodilators Recheck CXR AM 04/22 Monitor BMET intermittently Monitor I/Os Correct electrolytes as indicated DVT px: SQ heparin Monitor CBC intermittently Transfuse per usual guidelines Monitor temp, WBC count Micro and abx as above Per ID recommendations continue IV Ceftriaxone recommends 10 day course of treatment, however IV abx not required.  Best option at discharge is oral penicillin due to levo and azithro reistant.  Suggest not targeting treatment for the Coag negative staph will dc vancomycin Family updated @ bedside and updated  -Hopefully pt can be transferred out of the [facility] in the next day or two  Marda Stalker, [place] Pager [phone] (please enter 7 digits) [place] Pager [phone] (please enter 7 digits)  PCCM ATTENDING ATTESTATION:  I have evaluated patient with the APP  [place], reviewed database in its  entirety and discussed care plan in detail. In addition, this patient was discussed on multidisciplinary rounds.   Important exam findings: Much less dyspneic Cognition improved Still on intermittent BiPAP Minimal wheezes CXR unchanged  Plan as above Watch in SDU through today Husband updated @ bedside  Merton Border, MD PCCM service Mobile [phone] Pager [phone] 07/06/2016 3:51 PM

## 2016-07-06 NOTE — Progress Notes (Signed)
[place] at [place] NAME: Nataleigh Griffin    MR#:  [id number]  DATE OF BIRTH:  [birth year]  SUBJECTIVE:  Feels a lot better. Uses prn BIPAP only  REVIEW OF SYSTEMS:   Review of Systems  Constitutional: Negative for chills, fever and weight loss.  HENT: Negative for ear discharge, ear pain and nosebleeds.   Eyes: Negative for blurred vision, pain and discharge.  Respiratory: Positive for shortness of breath. Negative for sputum production, wheezing and stridor.   Cardiovascular: Negative for chest pain, palpitations, orthopnea and PND.  Gastrointestinal: Negative for abdominal pain, diarrhea, nausea and vomiting.  Genitourinary: Negative for frequency and urgency.  Musculoskeletal: Negative for back pain and joint pain.  Neurological: Positive for weakness. Negative for sensory change, speech change and focal weakness.  Psychiatric/Behavioral: Negative for depression and hallucinations. The patient is not nervous/anxious.    Tolerating Diet:yes Tolerating PT: pending  DRUG ALLERGIES:   Allergies  Allergen Reactions  . Lyrica [Pregabalin] Other (See Comments)    Reaction: made Restless Leg Syndrome worse     VITALS:  Blood pressure 139/84, pulse 74, temperature 98.2 F (36.8 C), temperature source Oral, resp. rate (!) 36, height 5\' 7"  (1.702 m), weight 119 kg (262 lb 5.6 oz), SpO2 93 %.  PHYSICAL EXAMINATION:   Physical Exam  GENERAL:  71 y.o.-year-old patient lying in the bed with mild acute distress. Obese EYES: Pupils equal, round, reactive to light and accommodation. No scleral icterus. Extraocular muscles intact.  HEENT: Head atraumatic, normocephalic. Oropharynx and nasopharynx clear. BIPAP+ NECK:  Supple, no jugular venous distention. No thyroid enlargement, no tenderness.  LUNGS:distant breath sounds bilaterally, no wheezing, rales, rhonchi. No use of accessory muscles of respiration.  CARDIOVASCULAR: S1, S2 normal. No  murmurs, rubs, or gallops.  ABDOMEN: Soft, nontender, nondistended. Bowel sounds present. No organomegaly or mass.  EXTREMITIES: No cyanosis, clubbing or edema b/l.    NEUROLOGIC: Cranial nerves II through XII are intact. No focal Motor or sensory deficits b/l.   PSYCHIATRIC:  patient is alert and oriented x 3.  SKIN: No obvious rash, lesion, or ulcer.   LABORATORY PANEL:  CBC  Recent Labs Lab 07/05/16 0345  WBC 13.4*  HGB 14.4  HCT 44.7  PLT 140*    Chemistries   Recent Labs Lab 07/02/16 1127  07/04/16 0419 07/05/16 0345  NA 134*  < > 136  --   K 4.4  < > 4.6  --   CL 97*  < > 100*  --   CO2 28  < > 27  --   GLUCOSE 95  < > 94  --   BUN 58*  < > 66*  --   CREATININE 2.92*  < > 2.08* 1.10*  CALCIUM 8.1*  < > 8.3*  --   AST 25  --   --   --   ALT 14  --   --   --   ALKPHOS 108  --   --   --   BILITOT 1.0  --   --   --   < > = values in this interval not displayed. Cardiac Enzymes  Recent Labs Lab 07/02/16 1127  TROPONINI 0.06*   RADIOLOGY:  Dg Chest Port 1 View  Result Date: 07/06/2016 CLINICAL DATA:  Pneumonia. EXAM: PORTABLE CHEST 1 VIEW COMPARISON:  Radiograph of July 04, 2016. FINDINGS: Stable cardiomegaly. No pneumothorax is noted. Increased right upper lobe opacity is noted consistent with pneumonia.  Mild bibasilar subsegmental atelectasis or infiltrates are noted. Probable minimal bilateral pleural effusions are noted. Status post left shoulder arthroplasty. Degenerative joint disease is seen involving the right glenohumeral joint. IMPRESSION: Increased right upper lobe opacity is noted consistent with pneumonia. Mild bibasilar subsegmental atelectasis or infiltrates are noted. Electronically Signed   By: Marijo Conception, M.D.   On: 07/06/2016 10:21   ASSESSMENT AND PLAN:  [name] a 71 y.o.femalepresented with respiratory distress. She stated on Thursday she started feeling bad. She needed to increase her oxygen from 2 L to 3 L. On Saturday she had  shaking chills and fever.   1. Acute respiratory failure with hypoxia and acidosis. - Patient is back on BiPAP  -1/2 BC GPC  2. Clinical sepsis with Right-sided pneumonia, leukocytosis and tachypnea. - will continue vanc and rocephin---now on IV rocephin only -  MRSA PCR negative  -BC positive for strept and staph -case d/w ID  3. Acute kidney injury. - Gentle IV fluid hydration with history of congestive heart failure. Hold Zaroxolyn and losartan -creat improving Baseline creat 0.75 in 01/2016 Came in with 2.53--- 2.08--1.10  4. COPD on chronic oxygen. Ordered nebulizer treatments and change to oral steroids  5. Sleep apnea on CPAP at night   6. History of diastolic congestive heart failure. This does not seem like heart failure at this point.  7. Essential hypertension blood pressure on the lower side. IV fluid hydration  8. Hypothyroidism unspecified on levothyroxine  9. Restless leg syndrome on Mirapex  Case discussed with Care Management/Social Worker. Management plans discussed with the patient, family and they are in agreement.  CODE STATUS: DNR  DVT Prophylaxis: lovenox  TOTAL TIME TAKING CARE OF THIS PATIENT: *25 minutes.  >50% time spent on counselling and coordination of care  POSSIBLE D/C IN 1-2 DAYS, DEPENDING ON CLINICAL CONDITION.  Note: This dictation was prepared with Dragon dictation along with smaller phrase technology. Any transcriptional errors that result from this process are unintentional.  Neyland Pettengill M.D on 07/06/2016 at 3:12 PM  Between 7am to 6pm - Pager - [phone]  After 6pm go to [email or web address] - password [email or web address] [place] [facility]  Office  [phone]  CC: Primary care physician; Adin Hector, MD

## 2016-07-07 ENCOUNTER — Inpatient Hospital Stay: Payer: Medicare HMO

## 2016-07-07 LAB — CBC
HEMATOCRIT: 39.7 % (ref 35.0–47.0)
Hemoglobin: 12.8 g/dL (ref 12.0–16.0)
MCH: 30 pg (ref 26.0–34.0)
MCHC: 32.4 g/dL (ref 32.0–36.0)
MCV: 92.8 fL (ref 80.0–100.0)
Platelets: 127 10*3/uL — ABNORMAL LOW (ref 150–440)
RBC: 4.27 MIL/uL (ref 3.80–5.20)
RDW: 16.7 % — AB (ref 11.5–14.5)
WBC: 7.5 10*3/uL (ref 3.6–11.0)

## 2016-07-07 LAB — GLUCOSE, CAPILLARY: Glucose-Capillary: 92 mg/dL (ref 65–99)

## 2016-07-07 LAB — CULTURE, BLOOD (ROUTINE X 2)

## 2016-07-07 LAB — BASIC METABOLIC PANEL
Anion gap: 5 (ref 5–15)
BUN: 16 mg/dL (ref 6–20)
CO2: 33 mmol/L — AB (ref 22–32)
Calcium: 8.4 mg/dL — ABNORMAL LOW (ref 8.9–10.3)
Chloride: 105 mmol/L (ref 101–111)
Creatinine, Ser: 0.75 mg/dL (ref 0.44–1.00)
GFR calc Af Amer: >60 mL/min (ref >60)
GLUCOSE: 104 mg/dL — AB (ref 65–99)
POTASSIUM: 3.9 mmol/L (ref 3.5–5.1)
Sodium: 143 mmol/L (ref 135–145)

## 2016-07-07 MED ORDER — MORPHINE SULFATE (PF) 2 MG/ML IV SOLN
0.5000 mg | Freq: Once | INTRAVENOUS | Status: AC
  Administered 2016-07-07: 0.5 mg via INTRAVENOUS
  Filled 2016-07-07: qty 1

## 2016-07-07 MED ORDER — IPRATROPIUM-ALBUTEROL 0.5-2.5 (3) MG/3ML IN SOLN
3.0000 mL | RESPIRATORY_TRACT | Status: DC | PRN
  Administered 2016-07-07: 3 mL via RESPIRATORY_TRACT
  Filled 2016-07-07: qty 3

## 2016-07-07 MED ORDER — LEVOFLOXACIN 500 MG PO TABS
500.0000 mg | ORAL_TABLET | Freq: Every day | ORAL | Status: DC
  Administered 2016-07-07: 500 mg via ORAL
  Filled 2016-07-07 (×2): qty 1

## 2016-07-07 MED ORDER — TIOTROPIUM BROMIDE MONOHYDRATE 18 MCG IN CAPS
18.0000 ug | ORAL_CAPSULE | Freq: Every day | RESPIRATORY_TRACT | Status: DC
  Administered 2016-07-07: 18 ug via RESPIRATORY_TRACT
  Filled 2016-07-07: qty 5

## 2016-07-07 MED ORDER — ENOXAPARIN SODIUM 40 MG/0.4ML ~~LOC~~ SOLN
40.0000 mg | SUBCUTANEOUS | Status: DC
  Administered 2016-07-07 – 2016-07-09 (×3): 40 mg via SUBCUTANEOUS
  Filled 2016-07-07 (×5): qty 0.4

## 2016-07-07 MED ORDER — FLUTICASONE FUROATE-VILANTEROL 200-25 MCG/INH IN AEPB
1.0000 | INHALATION_SPRAY | Freq: Every day | RESPIRATORY_TRACT | Status: DC
  Administered 2016-07-07: 1 via RESPIRATORY_TRACT
  Filled 2016-07-07: qty 28

## 2016-07-07 NOTE — Plan of Care (Signed)
Problem: Education: Goal: Knowledge of [facility] Education information/materials will improve Outcome: Progressing Pt POC discussed with pt and son (via phone), pt denies pain. Pt remains on bipap over night.  Problem: Safety: Goal: Ability to remain free from injury will improve Outcome: Progressing Pt remains free of falls, call bell within reach.   Problem: Pain Managment: Goal: General experience of comfort will improve Outcome: Progressing Pt denies  Problem: Physical Regulation: Goal: Will remain free from infection Outcome: Progressing Cont to monitor WBC, pt afebrile.   Problem: Activity: Goal: Risk for activity intolerance will decrease Outcome: Progressing Pt oob to bsc to void.

## 2016-07-07 NOTE — Progress Notes (Signed)
[place] at [place] NAME: Hannah Powell    MR#:  [id number]  DATE OF BIRTH:  Nov 02, 1945  SUBJECTIVE:  Feels a lot better. Uses prn BIPAP only Eating well  REVIEW OF SYSTEMS:   Review of Systems  Constitutional: Negative for chills, fever and weight loss.  HENT: Negative for ear discharge, ear pain and nosebleeds.   Eyes: Negative for blurred vision, pain and discharge.  Respiratory: Positive for shortness of breath. Negative for sputum production, wheezing and stridor.   Cardiovascular: Negative for chest pain, palpitations, orthopnea and PND.  Gastrointestinal: Negative for abdominal pain, diarrhea, nausea and vomiting.  Genitourinary: Negative for frequency and urgency.  Musculoskeletal: Negative for back pain and joint pain.  Neurological: Positive for weakness. Negative for sensory change, speech change and focal weakness.  Psychiatric/Behavioral: Negative for depression and hallucinations. The patient is not nervous/anxious.    Tolerating Diet:yes Tolerating PT: pending  DRUG ALLERGIES:   Allergies  Allergen Reactions  . Lyrica [Pregabalin] Other (See Comments)    Reaction: made Restless Leg Syndrome worse     VITALS:  Blood pressure (!) 146/79, pulse 68, temperature 98.4 F (36.9 C), temperature source Axillary, resp. rate (!) 21, height 5\' 7"  (1.702 m), weight 119 kg (262 lb 5.6 oz), SpO2 93 %.  PHYSICAL EXAMINATION:   Physical Exam  GENERAL:  71 y.o.-year-old patient lying in the bed with mild acute distress. Obese EYES: Pupils equal, round, reactive to light and accommodation. No scleral icterus. Extraocular muscles intact.  HEENT: Head atraumatic, normocephalic. Oropharynx and nasopharynx clear. BIPAP+ NECK:  Supple, no jugular venous distention. No thyroid enlargement, no tenderness.  LUNGS:distant breath sounds bilaterally, no wheezing, rales, rhonchi. No use of accessory muscles of respiration.   CARDIOVASCULAR: S1, S2 normal. No murmurs, rubs, or gallops.  ABDOMEN: Soft, nontender, nondistended. Bowel sounds present. No organomegaly or mass.  EXTREMITIES: No cyanosis, clubbing or edema b/l.    NEUROLOGIC: Cranial nerves II through XII are intact. No focal Motor or sensory deficits b/l.   PSYCHIATRIC:  patient is alert and oriented x 3.  SKIN: No obvious rash, lesion, or ulcer.   LABORATORY PANEL:  CBC  Recent Labs Lab 07/07/16 0447  WBC 7.5  HGB 12.8  HCT 39.7  PLT 127*    Chemistries   Recent Labs Lab 07/02/16 1127  07/07/16 0447  NA 134*  < > 143  K 4.4  < > 3.9  CL 97*  < > 105  CO2 28  < > 33*  GLUCOSE 95  < > 104*  BUN 58*  < > 16  CREATININE 2.92*  < > 0.75  CALCIUM 8.1*  < > 8.4*  AST 25  --   --   ALT 14  --   --   ALKPHOS 108  --   --   BILITOT 1.0  --   --   < > = values in this interval not displayed. Cardiac Enzymes  Recent Labs Lab 07/02/16 1127  TROPONINI 0.06*   RADIOLOGY:  Dg Chest Port 1 View  Result Date: 07/07/2016 CLINICAL DATA:  Initial evaluation for acute respiratory failure. EXAM: PORTABLE CHEST 1 VIEW COMPARISON:  Prior radiograph from 07/06/2016. FINDINGS: Stable cardiomegaly.  Mediastinal silhouette within normal limits. Lungs mildly hypoinflated. Previously noted right upper lobe infiltrate again seen, improved from previous. Improved underlying vascular congestion as compared to prior. Increased density at the left lung base may reflect atelectasis or infiltrate. Possible small left  pleural effusion. No pneumothorax. No acute osseus abnormality. Cervical ACDF and left shoulder arthroplasty noted. Degenerative changes noted at the right shoulder. IMPRESSION: 1. Persistent but improved right upper lobe infiltrate, suggesting improved pneumonia. 2. Improved underlying pulmonary vascular congestion as compared to 07/06/16. 3. Increased density at the left lung base, which may reflect atelectasis or infiltrate. Possible small left  pleural effusion may be present. Electronically Signed   By: Jeannine Boga M.D.   On: 07/07/2016 03:43   Dg Chest Port 1 View  Result Date: 07/06/2016 CLINICAL DATA:  Pneumonia. EXAM: PORTABLE CHEST 1 VIEW COMPARISON:  Radiograph of July 04, 2016. FINDINGS: Stable cardiomegaly. No pneumothorax is noted. Increased right upper lobe opacity is noted consistent with pneumonia. Mild bibasilar subsegmental atelectasis or infiltrates are noted. Probable minimal bilateral pleural effusions are noted. Status post left shoulder arthroplasty. Degenerative joint disease is seen involving the right glenohumeral joint. IMPRESSION: Increased right upper lobe opacity is noted consistent with pneumonia. Mild bibasilar subsegmental atelectasis or infiltrates are noted. Electronically Signed   By: Marijo Conception, M.D.   On: 07/06/2016 10:21   ASSESSMENT AND PLAN:  [name] a 71 y.o.femalepresented with respiratory distress. She stated on Thursday she started feeling bad. She needed to increase her oxygen from 2 L to 3 L. On Saturday she had shaking chills and fever.   1. Acute respiratory failure with hypoxia and acidosis. - Patient is back on BiPAP---now on [place] oxygen  -1/2 BC GPC  2. Clinical sepsis with Right-sided pneumonia, leukocytosis and tachypnea. - will continue vanc and rocephin---now on IV rocephin only--changed to po levaquin -  MRSA PCR negative  -BC positive for strept and staph -case d/w ID  3. Acute kidney injury due to ATN from pneumonia and sepsis. -creat improving Baseline creat 0.75 in 01/2016 Came in with 2.53--- 2.08--1.10--0.75  4. COPD on chronic oxygen. Ordered nebulizer treatments and change to oral steroids  5. Sleep apnea on CPAP at night   6. History of diastolic congestive heart failure. This does not seem like heart failure at this point.  7. Essential hypertension blood pressure on the lower side. IV fluid hydration  8. Hypothyroidism unspecified  on levothyroxine  9. Restless leg syndrome on Mirapex  Transfer to med floor PT to see Case discussed with Care Management/Social Worker. Management plans discussed with the patient, family and they are in agreement.  CODE STATUS: DNR  DVT Prophylaxis: lovenox  TOTAL TIME TAKING CARE OF THIS PATIENT: *25 minutes.  >50% time spent on counselling and coordination of care  POSSIBLE D/C IN 1-2 DAYS, DEPENDING ON CLINICAL CONDITION.  Note: This dictation was prepared with Dragon dictation along with smaller phrase technology. Any transcriptional errors that result from this process are unintentional.  Dashanique Brownstein M.D on 07/07/2016 at 11:42 AM  Between 7am to 6pm - Pager - [phone]  After 6pm go to [email or web address] - password [email or web address] [facility]  Office  [phone]  CC: Primary care physician; Adin Hector, MD

## 2016-07-07 NOTE — Progress Notes (Signed)
PT Cancellation Note  Patient Details Name: Hannah Powell MRN: [id number] DOB: 1946-02-21   Cancelled Treatment:    Reason Eval/Treat Not Completed: Other (comment). Consult received for re-evaluation. Re-eval attempted, however pt just reported she received morphine secondary to difficulty breathing and is requesting to re-attempt at another time. Will re-attempt next date.   Tanaya Dunigan 07/07/2016, 2:34 PM Greggory Stallion, PT, DPT [phone]

## 2016-07-07 NOTE — Progress Notes (Signed)
PULMONARY / CRITICAL CARE MEDICINE   Name: Hannah Powell MRN: [id number] DOB: 1945/10/13    ADMISSION DATE:  07/02/2016 CONSULTATION DATE: 07/02/16  REFERRING MD: Earleen Newport  PT PROFILE: 71 y.o. F former smoker pt of pulmonologist Dr. Mortimer Fries admitted 04/16 with acute on chronic hypercapnic hypoxic respiratory failure secondary to AECOPD and right sided pneumonia requiring continuous BIpap.    SUBJECTIVE:  Much improved. No new complaints. Requests to transfer out of ICU/SDU  VITAL SIGNS: BP (!) 146/79 (BP Location: Left Arm)   Pulse 68   Temp 98.4 F (36.9 C) (Axillary)   Resp (!) 21   Ht 5\' 7"  (1.702 m)   Wt 119 kg (262 lb 5.6 oz)   SpO2 93%   BMI 41.09 kg/m   HEMODYNAMICS:    VENTILATOR SETTINGS: FiO2 (%):  [35 %] 35 %  INTAKE / OUTPUT: I/O last 3 completed shifts: In: 2670 [P.O.:120; I.V.:2550] Out: 1750 [Urine:1750]  PHYSICAL EXAMINATION: NAD HEENT WNL No JVD Few crackles on R, minimal scattered wheezes Reg, no M NABS, soft No edema, warm  LABS: Reviewed   CXR:  Improving RUL infiltrate  ASSESSMENT / PLAN: Acute on chronic hypoxemic/hypercarbic respiratory failure Moderate to severe COPD at baseline Pneumococcal pneumonia - clinically and radiographically resolving Acute kidney injury- resolved False positive blood cultures for coag neg staph  AMS, acute encephalopathy - resolved Hx: OSA-cpap qhs   P: Cont supplemental O2 - baseline is 3 lpm [place] Changed controlled regimen to Breo 200 and tiotropium - she should be discharged to home on these Cont PRN Duoneb Change abx to levofloxacin and complete 5 more days of abx per ID recs Transfer to med-surg I have requested follow up with Dr Mortimer Fries in 3-4 weeks with CXR prior  PCCM will sign off. Please call if we can be of further assistance   Merton Border, MD PCCM service Mobile [phone] Pager [phone] 07/07/2016 12:01 PM

## 2016-07-07 NOTE — Progress Notes (Signed)
Sats on [place] are 86-87% at this time, Left patient at 3.5 liters, RN spoke with Dr. Posey Pronto about saturation of 86-87% and no changes were requested at this time.

## 2016-07-08 LAB — GLUCOSE, CAPILLARY: GLUCOSE-CAPILLARY: 70 mg/dL (ref 65–99)

## 2016-07-08 MED ORDER — ROFLUMILAST 500 MCG PO TABS
500.0000 ug | ORAL_TABLET | Freq: Every day | ORAL | Status: DC
  Filled 2016-07-08: qty 1

## 2016-07-08 MED ORDER — LOSARTAN POTASSIUM 25 MG PO TABS
25.0000 mg | ORAL_TABLET | Freq: Every day | ORAL | Status: DC
  Filled 2016-07-08: qty 1

## 2016-07-08 MED ORDER — AMOXICILLIN-POT CLAVULANATE 875-125 MG PO TABS
1.0000 | ORAL_TABLET | Freq: Two times a day (BID) | ORAL | Status: DC
  Filled 2016-07-08: qty 1

## 2016-07-08 MED ORDER — FUROSEMIDE 10 MG/ML IJ SOLN
40.0000 mg | Freq: Once | INTRAMUSCULAR | Status: AC
Start: 2016-07-08 — End: 2016-07-08
  Administered 2016-07-08: 40 mg via INTRAVENOUS
  Filled 2016-07-08: qty 4

## 2016-07-08 MED ORDER — ALBUTEROL SULFATE (2.5 MG/3ML) 0.083% IN NEBU: 2.5000 mg | INHALATION_SOLUTION | RESPIRATORY_TRACT | Status: DC | PRN

## 2016-07-08 MED ORDER — IPRATROPIUM-ALBUTEROL 0.5-2.5 (3) MG/3ML IN SOLN
3.0000 mL | Freq: Four times a day (QID) | RESPIRATORY_TRACT | Status: DC
  Administered 2016-07-08 – 2016-07-24 (×64): 3 mL via RESPIRATORY_TRACT
  Filled 2016-07-08 (×65): qty 3

## 2016-07-08 MED ORDER — AMPICILLIN-SULBACTAM SODIUM 3 (2-1) G IJ SOLR
3.0000 g | Freq: Three times a day (TID) | INTRAMUSCULAR | Status: DC
  Filled 2016-07-08 (×2): qty 3

## 2016-07-08 MED ORDER — FUROSEMIDE 40 MG PO TABS: 40.0000 mg | ORAL_TABLET | Freq: Every day | ORAL | Status: DC

## 2016-07-08 MED ORDER — FLUTICASONE FUROATE-VILANTEROL 100-25 MCG/INH IN AEPB: 1.0000 | INHALATION_SPRAY | Freq: Every day | RESPIRATORY_TRACT | Status: DC

## 2016-07-08 MED ORDER — MORPHINE SULFATE (PF) 4 MG/ML IV SOLN
1.0000 mg | INTRAVENOUS | Status: DC | PRN
  Administered 2016-07-08 – 2016-07-09 (×2): 2 mg via INTRAVENOUS
  Filled 2016-07-08 (×2): qty 1

## 2016-07-08 MED ORDER — POTASSIUM CL IN DEXTROSE 5% 20 MEQ/L IV SOLN
20.0000 meq | INTRAVENOUS | Status: DC
  Administered 2016-07-08 – 2016-07-12 (×5): 20 meq via INTRAVENOUS
  Filled 2016-07-08 (×6): qty 1000

## 2016-07-08 MED ORDER — B COMPLEX-C PO TABS
1.0000 | ORAL_TABLET | Freq: Every day | ORAL | Status: DC
  Filled 2016-07-08: qty 1

## 2016-07-08 MED ORDER — BUDESONIDE 0.25 MG/2ML IN SUSP
0.2500 mg | Freq: Four times a day (QID) | RESPIRATORY_TRACT | Status: DC
  Administered 2016-07-08 – 2016-07-24 (×64): 0.25 mg via RESPIRATORY_TRACT
  Filled 2016-07-08 (×66): qty 2

## 2016-07-08 MED ORDER — METOLAZONE 5 MG PO TABS
5.0000 mg | ORAL_TABLET | Freq: Every day | ORAL | Status: DC
  Administered 2016-07-09 – 2016-07-10 (×2): 5 mg via ORAL
  Filled 2016-07-08 (×4): qty 1

## 2016-07-08 MED ORDER — CALCIUM CARBONATE-VITAMIN D 500-200 MG-UNIT PO TABS
1.0000 | ORAL_TABLET | Freq: Every day | ORAL | Status: DC
  Filled 2016-07-08: qty 1

## 2016-07-08 MED ORDER — SODIUM CHLORIDE 0.9 % IV SOLN
3.0000 g | Freq: Four times a day (QID) | INTRAVENOUS | Status: DC
  Administered 2016-07-08 – 2016-07-13 (×20): 3 g via INTRAVENOUS
  Filled 2016-07-08 (×26): qty 3

## 2016-07-08 MED ORDER — MORPHINE SULFATE (PF) 2 MG/ML IV SOLN
0.5000 mg | INTRAVENOUS | Status: AC
  Administered 2016-07-08: 0.5 mg via INTRAVENOUS
  Filled 2016-07-08: qty 1

## 2016-07-08 MED ORDER — ALPRAZOLAM 0.5 MG PO TABS
0.5000 mg | ORAL_TABLET | Freq: Three times a day (TID) | ORAL | Status: DC | PRN
  Administered 2016-07-08 – 2016-07-11 (×3): 0.5 mg via ORAL
  Filled 2016-07-08 (×3): qty 1

## 2016-07-08 MED ORDER — HYDROCOD POLST-CPM POLST ER 10-8 MG/5ML PO SUER
5.0000 mL | Freq: Two times a day (BID) | ORAL | Status: DC
  Administered 2016-07-08: 5 mL via ORAL
  Filled 2016-07-08: qty 5

## 2016-07-08 NOTE — Progress Notes (Signed)
RT called by RN to place patient on HFNC per Dr. Ena Dawley request. Patients sats on 3.5l [place] when RT entered room were 66%.

## 2016-07-08 NOTE — Progress Notes (Signed)
Patient is being transferred to CCU. Gave report to Q[place]a Skye

## 2016-07-08 NOTE — Progress Notes (Signed)
Pt reports anxiety about being alone tonight, requesting something to relax.  Pt given xanax Prn.

## 2016-07-08 NOTE — Progress Notes (Signed)
PT Cancellation Note  Patient Details Name: Hannah Powell MRN: [id number] DOB: [birth year]   Cancelled Treatment:    Reason Eval/Treat Not Completed: Patient not medically ready. Pt now transferred to CCU secondary to respiratory distress. Will need new order to for continued PT when medically stable. Will cancel current order.   Jaydyn Bozzo 07/08/2016, 11:42 AM  Greggory Stallion, PT, DPT [phone]

## 2016-07-08 NOTE — Progress Notes (Addendum)
[place] [facility] [place] NAME: Hannah Powell    MR#:  [id number]  DATE OF BIRTH:  November 24, 1945  SUBJECTIVE:  Declined this am when PT was about to start PT. sats dropped in the 60%'s. Her oxygen tubing was kinked. Now on HFNC 45% ofor 30 mins but sats 85-87% Lip cyanosis Anxious Cough with thick phlegm. Unable to cough up much.  REVIEW OF SYSTEMS:   Review of Systems  Constitutional: Negative for chills, fever and weight loss.  HENT: Negative for ear discharge, ear pain and nosebleeds.   Eyes: Negative for blurred vision, pain and discharge.  Respiratory: Positive for shortness of breath. Negative for sputum production, wheezing and stridor.   Cardiovascular: Negative for chest pain, palpitations, orthopnea and PND.  Gastrointestinal: Negative for abdominal pain, diarrhea, nausea and vomiting.  Genitourinary: Negative for frequency and urgency.  Musculoskeletal: Negative for back pain and joint pain.  Neurological: Positive for weakness. Negative for sensory change, speech change and focal weakness.  Psychiatric/Behavioral: Negative for depression and hallucinations. The patient is not nervous/anxious.    Tolerating Diet:yes Tolerating PT: pending  DRUG ALLERGIES:   Allergies  Allergen Reactions  . Lyrica [Pregabalin] Other (See Comments)    Reaction: made Restless Leg Syndrome worse     VITALS:  Blood pressure (!) 154/62, pulse 71, temperature 98.9 F (37.2 C), temperature source Oral, resp. rate (!) 29, height 5\' 7"  (1.702 m), weight 119 kg (262 lb 5.6 oz), SpO2 92 %.  PHYSICAL EXAMINATION:   Physical Exam  GENERAL:  71 y.o.-year-old patient lying in the bed with moderate  distress. Obese EYES: Pupils equal, round, reactive to light and accommodation. No scleral icterus. Extraocular muscles intact.  HEENT: Head atraumatic, normocephalic. Oropharynx and nasopharynx clear. BIPAP+ cyanotic lips NECK:  Supple, no jugular venous  distention. No thyroid enlargement, no tenderness.  LUNGS:distant breath sounds bilaterally, no wheezing,++ rales,no rhonchi. Shallow breathing CARDIOVASCULAR: S1, S2 normal. No murmurs, rubs, or gallops.  ABDOMEN: Soft, nontender, nondistended. Bowel sounds present. No organomegaly or mass.  EXTREMITIES: No cyanosis, clubbing or edema b/l.    NEUROLOGIC: Cranial nerves II through XII are intact. No focal Motor or sensory deficits b/l.   PSYCHIATRIC:  patient is alert and oriented x 3. But anxious  SKIN: No obvious rash, lesion, or ulcer.   LABORATORY PANEL:  CBC  Recent Labs Lab 07/07/16 0447  WBC 7.5  HGB 12.8  HCT 39.7  PLT 127*    Chemistries   Recent Labs Lab 07/02/16 1127  07/07/16 0447  NA 134*  < > 143  K 4.4  < > 3.9  CL 97*  < > 105  CO2 28  < > 33*  GLUCOSE 95  < > 104*  BUN 58*  < > 16  CREATININE 2.92*  < > 0.75  CALCIUM 8.1*  < > 8.4*  AST 25  --   --   ALT 14  --   --   ALKPHOS 108  --   --   BILITOT 1.0  --   --   < > = values in this interval not displayed. Cardiac Enzymes  Recent Labs Lab 07/02/16 1127  TROPONINI 0.06*   RADIOLOGY:  Dg Chest Port 1 View  Result Date: 07/07/2016 CLINICAL DATA:  Initial evaluation for acute respiratory failure. EXAM: PORTABLE CHEST 1 VIEW COMPARISON:  Prior radiograph from 07/06/2016. FINDINGS: Stable cardiomegaly.  Mediastinal silhouette within normal limits. Lungs mildly hypoinflated. Previously noted right upper  lobe infiltrate again seen, improved from previous. Improved underlying vascular congestion as compared to prior. Increased density at the left lung base may reflect atelectasis or infiltrate. Possible small left pleural effusion. No pneumothorax. No acute osseus abnormality. Cervical ACDF and left shoulder arthroplasty noted. Degenerative changes noted at the right shoulder. IMPRESSION: 1. Persistent but improved right upper lobe infiltrate, suggesting improved pneumonia. 2. Improved underlying pulmonary  vascular congestion as compared to 07/06/16. 3. Increased density at the left lung base, which may reflect atelectasis or infiltrate. Possible small left pleural effusion may be present. Electronically Signed   By: Jeannine Boga M.D.   On: 07/07/2016 03:43   Dg Chest Port 1 View  Result Date: 07/06/2016 CLINICAL DATA:  Pneumonia. EXAM: PORTABLE CHEST 1 VIEW COMPARISON:  Radiograph of July 04, 2016. FINDINGS: Stable cardiomegaly. No pneumothorax is noted. Increased right upper lobe opacity is noted consistent with pneumonia. Mild bibasilar subsegmental atelectasis or infiltrates are noted. Probable minimal bilateral pleural effusions are noted. Status post left shoulder arthroplasty. Degenerative joint disease is seen involving the right glenohumeral joint. IMPRESSION: Increased right upper lobe opacity is noted consistent with pneumonia. Mild bibasilar subsegmental atelectasis or infiltrates are noted. Electronically Signed   By: Marijo Conception, M.D.   On: 07/06/2016 10:21   ASSESSMENT AND PLAN:  [name] a 71 y.o.femalepresented with respiratory distress. She stated on Thursday she started feeling bad. She needed to increase her oxygen from 2 L to 3 L. On Saturday she had shaking chills and fever.   1. Acute on chronic hypoxic respiratory failure with hypoxia and acidosis. - Patient is back on BiPAP---now on [place] oxygen  -1/2 BC GPC -looks pt going into impending  respiratory failure with severe hypoxia and cyanosis -transfer to CCU -Spoke with Dr Alva Garnet - Lasix 40 mg stat, IV morphine, BIPAP -pt will benefit from NIV after discharge due to her severe COPD  2. Clinical sepsis with Right-sided pneumonia, leukocytosis and tachypnea. - will continue vanc and rocephin---now on IV rocephin only--changed to po levaquin -  MRSA PCR negative  -BC positive for strept and staph -sputum culture strept pneumonia resistant to levofloxacin--per ID use oral penicillin.will give IV unasyn now  that she is will require BIPAP  3. Acute kidney injury due to ATN from pneumonia and sepsis. -creat improving Baseline creat 0.75 in 01/2016 Came in with 2.53--- 2.08--1.10--0.75  4. COPD on chronic oxygen. Ordered nebulizer treatments and change to oral steroids  5. Sleep apnea on CPAP at night   6. History of diastolic congestive heart failure. This does not seem like heart failure at this point.  7. Essential hypertension blood pressure on the lower side. IV fluid hydration  8. Hypothyroidism unspecified on levothyroxine  9. Restless leg syndrome on Mirapex  Transfer to CCU   Husband is requesting transfer to [facility] once pt stabilizes. Will pass on this to ICU attending.  Case discussed with Care Management/Social Worker. Management plans discussed with the patient, family and they are in agreement.  CODE STATUS: DNR  DVT Prophylaxis: lovenox  TOTAL TIME TAKING CARE OF THIS PATIENT: 30 minutes.  >50% time spent on counselling and coordination of care   Note: This dictation was prepared with Dragon dictation along with smaller phrase technology. Any transcriptional errors that result from this process are unintentional.  Chaundra Abreu M.D on 07/08/2016 at 10:08 AM  Between 7am to 6pm - Pager - [phone]  After 6pm go to [email or web address] - password [email or web address]  [employer]  Office  [phone]  CC: Primary care physician; Adin Hector, MD

## 2016-07-08 NOTE — Progress Notes (Signed)
PULMONARY / CRITICAL CARE MEDICINE   Name: Hannah Powell MRN: [id number] DOB: May 08, 1945    ADMISSION DATE:  07/02/2016 CONSULTATION DATE: 07/02/16  REFERRING MD: Earleen Newport  PT PROFILE: 71 y.o. F former smoker pt of pulmonologist Dr. Mortimer Fries admitted 04/16 with acute on chronic hypercapnic hypoxic respiratory failure secondary to AECOPD and right sided pneumonia requiring continuous BIpap.    SUBJECTIVE:  Patient was transferred out of SDU yesterday. This morning developed increasing respiratory distress with hypoxemia. Transferred back to SDU for BiPAP. Cognition intact. Comfortable on BiPAP.  VITAL SIGNS: BP 138/72   Pulse 65   Temp 97 F (36.1 C) (Axillary)   Resp (!) 35   Ht 5\' 5"  (1.651 m)   Wt 119.4 kg (263 lb 3.7 oz)   SpO2 94%   BMI 43.80 kg/m   HEMODYNAMICS:    VENTILATOR SETTINGS: FiO2 (%):  [35 %] 35 %  INTAKE / OUTPUT: I/O last 3 completed shifts: In: 600 [I.V.:600] Out: 500 [Urine:500]  PHYSICAL EXAMINATION: NAD HEENT WNL No JVD Scattered crackles and wheezes on R, diminished breath sounds on L Reg, no M NABS, soft No edema, warm  LABS: BMP Latest Ref Rng & Units 07/07/2016 07/05/2016 07/04/2016  Glucose 65 - 99 mg/dL 104(H) - 94  BUN 6 - 20 mg/dL 16 - 66(H)  Creatinine 0.44 - 1.00 mg/dL 0.75 1.10(H) 2.08(H)  Sodium 135 - 145 mmol/L 143 - 136  Potassium 3.5 - 5.1 mmol/L 3.9 - 4.6  Chloride 101 - 111 mmol/L 105 - 100(L)  CO2 22 - 32 mmol/L 33(H) - 27  Calcium 8.9 - 10.3 mg/dL 8.4(L) - 8.3(L)   CBC Latest Ref Rng & Units 07/07/2016 07/05/2016 07/03/2016  WBC 3.6 - 11.0 K/uL 7.5 13.4(H) 35.4(H)  Hemoglobin 12.0 - 16.0 g/dL 12.8 14.4 13.3  Hematocrit 35.0 - 47.0 % 39.7 44.7 41.5  Platelets 150 - 440 K/uL 127(L) 140(L) 172     CXR:  NNF  ASSESSMENT / PLAN: Acute on chronic hypoxemic/hypercarbic respiratory failure  Recurrent need for BiPAP Moderate to severe COPD at baseline based on previous PFTs Pneumococcal pneumonia - clinically and  radiographically resolving Acute kidney injury- resolved False positive blood cultures for coag neg staph  AMS, acute encephalopathy - resolved Hx: OSA-cpap qhs   P: Cont BiPAP Continue supplemental oxygen Changed back to nebulized steroids and bronchodilators Complete 10 days antibiotics (through 04/26) Husband and daughter have been updated in detail I have already requested follow up with Dr Mortimer Fries in 3-4 weeks with CXR prior   Merton Border, MD PCCM service Mobile [phone] Pager [phone] 07/08/2016 3:16 PM

## 2016-07-08 NOTE — Progress Notes (Signed)
Pharmacy Antibiotic Note  Hannah Powell is a 71 y.o. female admitted on 07/02/2016 with  pneumonia.  Pharmacy has been consulted for Unasyn dosing.  Plan: Will start patient on Unasyn 3gm IV every 6 hours based on current CrCl.   Height: 5\' 7"  (170.2 cm) Weight: 262 lb 5.6 oz (119 kg) IBW/kg (Calculated) : 61.6  Temp (24hrs), Avg:98.4 F (36.9 C), Min:98.1 F (36.7 C), Max:98.9 F (37.2 C)   Recent Labs Lab 07/02/16 1127 07/02/16 2306 07/03/16 0334 07/04/16 0419 07/05/16 0345 07/07/16 0447  WBC 36.7*  --  35.4*  --  13.4* 7.5  CREATININE 2.92*  --  2.53* 2.08* 1.10* 0.75  LATICACIDVEN 1.5 1.5  --   --   --   --     Estimated Creatinine Clearance: 88.6 mL/min (by C-G formula based on SCr of 0.75 mg/dL).    Allergies  Allergen Reactions  . Lyrica [Pregabalin] Other (See Comments)    Reaction: made Restless Leg Syndrome worse     Antimicrobials this admission: 4/20 ceftriaxone >> 4/20 4/21 levofloxacin >>4/21 4/22 Unasyn >>  Dose adjustments this admission:  Microbiology results: 4/17 BCx: STAPHYLOCOCCUS CAPITIS 4/18 BX: NG x 3 days  4/17 Sputum: STREPTOCOCCUS PNEUMONIAE 4/17 MRSA PCR: negative   Thank you for allowing pharmacy to be a part of this patient's care.  Pernell Dupre, PharmD, BCPS Clinical Pharmacist 07/08/2016 10:39 AM

## 2016-07-08 NOTE — Progress Notes (Signed)
PT Cancellation Note  Patient Details Name: Hannah Powell MRN: [id number] DOB: 22-Jul-1945   Cancelled Treatment:    Reason Eval/Treat Not Completed: Other (comment). PT entered room for re-evaluation attempt, however pt with difficulty breathing. Pt on 4L of O2 and at 66% sats. Called RN to assess. Taking increased time for sats to improve. When RN entered O2 sats at 82%. RN to call Respiratory. Not appropriate for PT re-evaluation at this time. Will re-attempt next date if medically stable.   Briasia Flinders 07/08/2016, 9:24 AM  Greggory Stallion, PT, DPT [phone]

## 2016-07-08 NOTE — Progress Notes (Signed)
Pulled patients morning meds but patient was not able to take them d/t sob. Advised CCU nurse that they were not given.

## 2016-07-09 ENCOUNTER — Other Ambulatory Visit: Payer: Self-pay | Admitting: *Deleted

## 2016-07-09 ENCOUNTER — Inpatient Hospital Stay: Payer: Medicare HMO

## 2016-07-09 ENCOUNTER — Telehealth: Payer: Self-pay | Admitting: Internal Medicine

## 2016-07-09 DIAGNOSIS — J189 Pneumonia, unspecified organism: Secondary | ICD-10-CM

## 2016-07-09 LAB — CBC
HCT: 43.5 % (ref 35.0–47.0)
HEMOGLOBIN: 13.5 g/dL (ref 12.0–16.0)
MCH: 29.5 pg (ref 26.0–34.0)
MCHC: 31.1 g/dL — ABNORMAL LOW (ref 32.0–36.0)
MCV: 94.8 fL (ref 80.0–100.0)
PLATELETS: 118 10*3/uL — AB (ref 150–440)
RBC: 4.59 MIL/uL (ref 3.80–5.20)
RDW: 16.5 % — ABNORMAL HIGH (ref 11.5–14.5)
WBC: 7.6 10*3/uL (ref 3.6–11.0)

## 2016-07-09 LAB — BASIC METABOLIC PANEL
ANION GAP: 5 (ref 5–15)
BUN: 11 mg/dL (ref 6–20)
CO2: 41 mmol/L — ABNORMAL HIGH (ref 22–32)
Calcium: 8.4 mg/dL — ABNORMAL LOW (ref 8.9–10.3)
Chloride: 98 mmol/L — ABNORMAL LOW (ref 101–111)
Creatinine, Ser: 0.66 mg/dL (ref 0.44–1.00)
GFR calc non Af Amer: >60 mL/min (ref >60)
Glucose, Bld: 96 mg/dL (ref 65–99)
POTASSIUM: 4 mmol/L (ref 3.5–5.1)
SODIUM: 144 mmol/L (ref 135–145)

## 2016-07-09 LAB — MAGNESIUM: MAGNESIUM: 1.5 mg/dL — AB (ref 1.7–2.4)

## 2016-07-09 LAB — PHOSPHORUS: Phosphorus: 2.6 mg/dL (ref 2.5–4.6)

## 2016-07-09 MED ORDER — BENZONATATE 100 MG PO CAPS
200.0000 mg | ORAL_CAPSULE | Freq: Three times a day (TID) | ORAL | Status: DC
  Administered 2016-07-09 – 2016-07-11 (×8): 200 mg via ORAL
  Filled 2016-07-09 (×8): qty 2

## 2016-07-09 MED ORDER — FUROSEMIDE 10 MG/ML IJ SOLN
INTRAMUSCULAR | Status: AC
  Filled 2016-07-09: qty 2

## 2016-07-09 MED ORDER — MAGNESIUM SULFATE 4 GM/100ML IV SOLN
4.0000 g | Freq: Once | INTRAVENOUS | Status: AC
  Administered 2016-07-09: 4 g via INTRAVENOUS
  Filled 2016-07-09: qty 100

## 2016-07-09 MED ORDER — FAMOTIDINE 40 MG/5ML PO SUSR
20.0000 mg | Freq: Every day | ORAL | Status: DC
  Filled 2016-07-09: qty 2.5

## 2016-07-09 MED ORDER — IBUPROFEN 400 MG PO TABS
400.0000 mg | ORAL_TABLET | ORAL | Status: DC | PRN
  Administered 2016-07-09: 400 mg via ORAL
  Filled 2016-07-09: qty 1

## 2016-07-09 MED ORDER — FUROSEMIDE 10 MG/ML IJ SOLN
20.0000 mg | Freq: Two times a day (BID) | INTRAMUSCULAR | Status: DC
  Administered 2016-07-09: 20 mg via INTRAVENOUS
  Filled 2016-07-09: qty 2

## 2016-07-09 MED ORDER — LOSARTAN POTASSIUM 25 MG PO TABS
25.0000 mg | ORAL_TABLET | Freq: Every day | ORAL | Status: DC
  Administered 2016-07-09 – 2016-07-11 (×3): 25 mg via ORAL
  Filled 2016-07-09 (×4): qty 1

## 2016-07-09 MED ORDER — FUROSEMIDE 10 MG/ML IJ SOLN
20.0000 mg | Freq: Once | INTRAMUSCULAR | Status: AC
  Administered 2016-07-09: 20 mg via INTRAVENOUS

## 2016-07-09 MED ORDER — ENOXAPARIN SODIUM 40 MG/0.4ML ~~LOC~~ SOLN
40.0000 mg | Freq: Two times a day (BID) | SUBCUTANEOUS | Status: DC
  Administered 2016-07-09 – 2016-07-14 (×11): 40 mg via SUBCUTANEOUS
  Filled 2016-07-09 (×12): qty 0.4

## 2016-07-09 MED ORDER — MAGNESIUM SULFATE 4 GM/100ML IV SOLN
4.0000 g | Freq: Once | INTRAVENOUS | Status: DC
  Filled 2016-07-09: qty 100

## 2016-07-09 MED ORDER — FAMOTIDINE 20 MG PO TABS
20.0000 mg | ORAL_TABLET | Freq: Every day | ORAL | Status: DC
  Administered 2016-07-09 – 2016-07-10 (×2): 20 mg via ORAL
  Filled 2016-07-09 (×2): qty 1

## 2016-07-09 MED ORDER — FUROSEMIDE 10 MG/ML IJ SOLN
40.0000 mg | Freq: Two times a day (BID) | INTRAMUSCULAR | Status: DC
  Administered 2016-07-09 – 2016-07-10 (×2): 40 mg via INTRAVENOUS
  Filled 2016-07-09 (×2): qty 4

## 2016-07-09 MED ORDER — ACETAMINOPHEN 325 MG PO TABS
650.0000 mg | ORAL_TABLET | Freq: Four times a day (QID) | ORAL | Status: DC | PRN
  Administered 2016-07-09 – 2016-07-12 (×2): 650 mg via ORAL
  Filled 2016-07-09 (×3): qty 2

## 2016-07-09 NOTE — Telephone Encounter (Signed)
Husband informed DR has seen pt today and to let us know if there is anything they need.

## 2016-07-09 NOTE — Progress Notes (Signed)
Pharmacy Antibiotic Note/ Electrolyte Monitoring  Hannah Powell is a 71 y.o. female admitted on 07/02/2016 with  pneumonia.  Pharmacy has been consulted for Unasyn dosing as well as for electrolyte monitoring and replacement.   Plan: 1. Continue Unasyn 3 g iv q 6 hours. Plan is for 10 day course of antibiotics per ID.   2. Magnesium sulfate 4 g iv once and will f/u AM labs.   Height: 5\' 5"  (165.1 cm) Weight: 263 lb 3.7 oz (119.4 kg) IBW/kg (Calculated) : 57  Temp (24hrs), Avg:98.8 F (37.1 C), Min:98.5 F (36.9 C), Max:99.3 F (37.4 C)   Recent Labs Lab 07/02/16 2306 07/03/16 0334 07/04/16 0419 07/05/16 0345 07/07/16 0447 07/09/16 0501  WBC  --  35.4*  --  13.4* 7.5 7.6  CREATININE  --  2.53* 2.08* 1.10* 0.75 0.66  LATICACIDVEN 1.5  --   --   --   --   --     Estimated Creatinine Clearance: 85.9 mL/min (by C-G formula based on SCr of 0.66 mg/dL).    BMP Latest Ref Rng & Units 07/09/2016 07/07/2016 07/05/2016  Glucose 65 - 99 mg/dL 96 104(H) -  BUN 6 - 20 mg/dL 11 16 -  Creatinine 0.44 - 1.00 mg/dL 0.66 0.75 1.10(H)  Sodium 135 - 145 mmol/L 144 143 -  Potassium 3.5 - 5.1 mmol/L 4.0 3.9 -  Chloride 101 - 111 mmol/L 98(L) 105 -  CO2 22 - 32 mmol/L 41(H) 33(H) -  Calcium 8.9 - 10.3 mg/dL 8.4(L) 8.4(L) -   Magnesium (mg/dL)  Date Value  07/09/2016 1.5 (L)  01/04/2013 2.0   Phosphorus (mg/dL)  Date Value  07/09/2016 2.6   Albumin (g/dL)  Date Value  07/02/2016 3.1 (L)  06/30/2014 3.8     Allergies  Allergen Reactions  . Lyrica [Pregabalin] Other (See Comments)    Reaction: made Restless Leg Syndrome worse     Antimicrobials this admission: 4/20 ceftriaxone >> 4/20 4/21 levofloxacin >>4/21 4/22 Unasyn >>  Dose adjustments this admission:  Microbiology results: 4/17 BCx: STAPHYLOCOCCUS CAPITIS 4/18 BX: NG x 3 days  4/17 Sputum: STREPTOCOCCUS PNEUMONIAE 4/17 MRSA PCR: negative   Thank you for allowing pharmacy to be a part of this patient's  care.  Napoleon Form, PharmD, BCPS Clinical Pharmacist 07/09/2016 3:22 PM

## 2016-07-09 NOTE — Telephone Encounter (Signed)
Pt spouse calling stating pt is in hospital and is in here now in ICU  Just wanted to let us know

## 2016-07-09 NOTE — Progress Notes (Signed)
Anticoagulation Monitoring  71 y/o F ordered Lovenox 40 mg daily for DVT prophylaxis.   Estimated Creatinine Clearance: 85.9 mL/min (by C-G formula based on SCr of 0.66 mg/dL).  Body mass index is 43.8 kg/m.  Will increase Lovenox to 40 mg bid for BMI > 40.   Ulice Dash, PharmD Clinical Pharmacist

## 2016-07-09 NOTE — Progress Notes (Signed)
Patient alert and oriented. Vitals stable- NSR on monitor.  Patient had coughing episodes during the morning- tessalon pearls ordered in addition to robitussin- patient's coughing has ended now. Patient weaned off bipap and is now on 3 liters nasal cannula sating 90%.  Patient eating- tolerating well.  Patient foley in place- lasix scheduled and patient has great urine output.  Magnesium replaced- and PT ordered.  Family at bedside.

## 2016-07-09 NOTE — Care Management (Signed)
Home health is covered at 100% per Tim with [facility].

## 2016-07-09 NOTE — Progress Notes (Signed)
[place] at [place] NAME: Hannah Powell    MR#:  [id number]  DATE OF BIRTH:  [birth year]  SUBJECTIVE:  Off BIPA at present Headache and cough today  REVIEW OF SYSTEMS:   Review of Systems  Constitutional: Negative for chills, fever and weight loss.  HENT: Negative for ear discharge, ear pain and nosebleeds.   Eyes: Negative for blurred vision, pain and discharge.  Respiratory: Positive for shortness of breath. Negative for sputum production, wheezing and stridor.   Cardiovascular: Negative for chest pain, palpitations, orthopnea and PND.  Gastrointestinal: Negative for abdominal pain, diarrhea, nausea and vomiting.  Genitourinary: Negative for frequency and urgency.  Musculoskeletal: Negative for back pain and joint pain.  Neurological: Positive for weakness. Negative for sensory change, speech change and focal weakness.  Psychiatric/Behavioral: Negative for depression and hallucinations. The patient is not nervous/anxious.    Tolerating Diet:yes Tolerating PT: pending  DRUG ALLERGIES:   Allergies  Allergen Reactions  . Lyrica [Pregabalin] Other (See Comments)    Reaction: made Restless Leg Syndrome worse     VITALS:  Blood pressure (!) 147/76, pulse 87, temperature 97.5 F (36.4 C), temperature source Axillary, resp. rate (!) 36, height 5\' 5"  (1.651 m), weight 119.4 kg (263 lb 3.7 oz), SpO2 90 %.  PHYSICAL EXAMINATION:   Physical Exam  GENERAL:  71 y.o.-year-old patient lying in the bed with moderate  distress. Obese EYES: Pupils equal, round, reactive to light and accommodation. No scleral icterus. Extraocular muscles intact.  HEENT: Head atraumatic, normocephalic. Oropharynx and nasopharynx clear. BIPAP+ cyanotic lips NECK:  Supple, no jugular venous distention. No thyroid enlargement, no tenderness.  LUNGS:distant breath sounds bilaterally, no wheezing,++ rales,no rhonchi. Shallow breathing CARDIOVASCULAR: S1, S2  normal. No murmurs, rubs, or gallops.  ABDOMEN: Soft, nontender, nondistended. Bowel sounds present. No organomegaly or mass.  EXTREMITIES: No cyanosis, clubbing or edema b/l.    NEUROLOGIC: Cranial nerves II through XII are intact. No focal Motor or sensory deficits b/l.   PSYCHIATRIC:  patient is alert and oriented x 3. But anxious  SKIN: No obvious rash, lesion, or ulcer.   LABORATORY PANEL:  CBC  Recent Labs Lab 07/09/16 0501  WBC 7.6  HGB 13.5  HCT 43.5  PLT 118*    Chemistries   Recent Labs Lab 07/09/16 0501  NA 144  K 4.0  CL 98*  CO2 41*  GLUCOSE 96  BUN 11  CREATININE 0.66  CALCIUM 8.4*  MG 1.5*   Cardiac Enzymes No results for input(s): TROPONINI in the last 168 hours. RADIOLOGY:  Dg Chest Port 1 View  Result Date: 07/09/2016 CLINICAL DATA:  Respiratory failure. EXAM: PORTABLE CHEST 1 VIEW COMPARISON:  07/07/2016 FINDINGS: Left shoulder arthroplasty and lower cervical spine fixation. Mildly degraded exam due to AP portable technique and patient body habitus. New or increased right and persistent small left pleural effusion. No pneumothorax. Mild interstitial edema persists. Worsened right upper lobe airspace disease. Increased bibasilar airspace opacities. IMPRESSION: Overall worsened aeration, with increased interstitial edema and multifocal airspace opacities. Persistent left and new or increased layering right pleural effusion. Electronically Signed   By: Abigail Miyamoto M.D.   On: 07/09/2016 07:28   ASSESSMENT AND PLAN:  Hannah Powell a 71 y.o.femalepresented with respiratory distress. She stated on Thursday she started feeling bad. She needed to increase her oxygen from 2 L to 3 L. On Saturday she had shaking chills and fever.   1. Acute on chronic hypoxic respiratory failure with  hypoxia and acidosis. Appears combination of severe COPD and CHF acute diastolic (Echo in the past EF 65%) - Patient is back on BiPAP---now on [place] oxygen  -IV lasix ---good  diureses  2. Clinical sepsis with Right-sided pneumonia, leukocytosis and tachypnea. - will continue vanc and rocephin---now on IV rocephin only--changed to po levaquin -  MRSA PCR negative  -BC positive for strept and staph -sputum culture strept pneumonia resistant to levofloxacin--per ID use oral penicillin. -will give IV unasyn now that she is will require BIPAP  3. Acute kidney injury due to ATN from pneumonia and sepsis. -creat improving Baseline creat 0.75 in 01/2016 Came in with 2.53--- 2.08--1.10--0.75  4. COPD on chronic oxygen. Ordered nebulizer treatments and change to oral steroids  5. Sleep apnea on CPAP at night   6. History of diastolic congestive heart failure. This does not seem like heart failure at this point.  7. Essential hypertension blood pressure on the lower side. IV fluid hydration  8. Hypothyroidism unspecified on levothyroxine  9. Restless leg syndrome on Mirapex   Spoke with dter  Case discussed with Care Management/Social Worker. Management plans discussed with the patient, family and they are in agreement.  CODE STATUS: DNR  DVT Prophylaxis: lovenox  TOTAL TIME TAKING CARE OF THIS PATIENT: 30 minutes.  >50% time spent on counselling and coordination of care   Note: This dictation was prepared with Dragon dictation along with smaller phrase technology. Any transcriptional errors that result from this process are unintentional.  Hannah Powell M.D on 07/09/2016 at 6:47 PM  Between 7am to 6pm - Pager - [phone]  After 6pm go to [email or web address] - password [email or web address] [facility]  Office  [phone]  CC: Primary care physician; Adin Hector, MD

## 2016-07-09 NOTE — Progress Notes (Signed)
[facility] INFECTI[facility] PROGRESS NOTE Date of Admission:  07/02/2016     ID: Hannah Powell is a 71 y.o. female with strep PNA and coag neg staph bacteremia Active Problems:   Acute respiratory failure with hypoxia (HCC)   Pressure injury of skin   Subjective: No fevers, now  off bipap sitting up in bed, eating.  Still with cough.   ROS  Eleven systems are reviewed and negative except per hpi  Medications:  Antibiotics Given (last 72 hours)    Date/Time Action Medication Dose   07/07/16 1221 Given   levofloxacin (LEVAQUIN) tablet 500 mg 500 mg     . aspirin EC  81 mg Oral Daily  . benzonatate  200 mg Oral TID  . budesonide (PULMICORT) nebulizer solution  0.25 mg Nebulization Q6H  . chlorhexidine  15 mL Mouth Rinse BID  . enoxaparin (LOVENOX) injection  40 mg Subcutaneous Q12H  . famotidine  20 mg Oral Daily  . furosemide      . furosemide  40 mg Intravenous Q12H  . ipratropium-albuterol  3 mL Nebulization Q6H  . levothyroxine  200 mcg Oral QAC breakfast  . losartan  25 mg Oral Daily  . metolazone  5 mg Oral Daily  . pramipexole  4 mg Oral QHS  . traZODone  100 mg Oral QHS    Objective: Vital signs in last 24 hours: Temp:  [98.5 F (36.9 C)-99.3 F (37.4 C)] 98.6 F (37 C) (04/23 1154) Pulse Rate:  [73-108] 97 (04/23 1531) Resp:  [14-41] 23 (04/23 1531) BP: (123-163)/(52-121) 140/78 (04/23 1500) SpO2:  [76 %-98 %] 92 % (04/23 1531) FiO2 (%):  [35 %-76 %] 35 % (04/23 0751) Constitutional:  Obese,  Mild  labored breathing, able to converse HENT: [place]/AT, PERRLA, no scleral icterus Mouth/Throat: Oropharynx is clear and moist. No oropharyngeal exudate.  Cardiovascular: Normal rate, regular rhythm and normal heart sounds. Exam reveals no gallop .  Pulmonary/Chest: poor air movement, rhonchi Neck = supple, no nuchal rigidity Abdominal: Soft. Bowel sounds are normal.  exhibits no distension. There is no tenderness.  Lymphadenopathy: no cervical adenopathy.  No axillary adenopathy Neurological: alert and oriented to person, place, and time.  Skin: Skin is warm and dry. No rash noted. No erythema.  Psychiatric: a normal mood and affect.  behavior is normal.   Lab Results  Recent Labs  07/07/16 0447 07/09/16 0501  WBC 7.5 7.6  HGB 12.8 13.5  HCT 39.7 43.5  NA 143 144  K 3.9 4.0  CL 105 98*  CO2 33* 41*  BUN 16 11  CREATININE 0.75 0.66    Microbiology: Results for orders placed or performed during the hospital encounter of 07/02/16  Blood culture (routine x 2)     Status: Abnormal   Collection Time: 07/02/16 11:47 AM  Result Value Ref Range Status   Specimen Description BLOOD LEFT ARM  Final   Special Requests   Final    BOTTLES DRAWN AEROBIC AND ANAEROBIC Blood Culture results may not be optimal due to an excessive volume of blood received in culture bottles   Culture  Setup Time   Final    GRAM POSITIVE COCCI IN CLUSTERS ANAEROBIC BOTTLE ONLY CRITICAL VALUE NOTED.  VALUE IS CONSISTENT WITH PREVIOUSLY REPORTED AND CALLED VALUE. Performed at [facility], [place] [address] [place], [place] [address]    Culture STAPHYLOCOCCUS CAPITIS (A)  Final   Report Status 07/07/2016 FINAL  Final   Organism ID, Bacteria STAPHYLOCOCCUS  CAPITIS  Final      Susceptibility   Staphylococcus capitis - MIC*    CIPROFLOXACIN 4 RESISTANT Resistant     ERYTHROMYCIN >=8 RESISTANT Resistant     GENTAMICIN <=0.5 SENSITIVE Sensitive     OXACILLIN >=4 RESISTANT Resistant     TETRACYCLINE <=1 SENSITIVE Sensitive     VANCOMYCIN <=0.5 SENSITIVE Sensitive     TRIMETH/SULFA <=10 SENSITIVE Sensitive     CLINDAMYCIN INTERMEDIATE Intermediate     RIFAMPIN <=0.5 SENSITIVE Sensitive     Inducible Clindamycin NEGATIVE Sensitive     * STAPHYLOCOCCUS CAPITIS  Blood culture (routine x 2)     Status: Abnormal   Collection Time: 07/02/16 11:47 AM  Result Value Ref Range Status   Specimen Description BLOOD RIGHT ARM  Final   Special Requests   Final     BOTTLES DRAWN AEROBIC AND ANAEROBIC Blood Culture adequate volume   Culture  Setup Time   Final    GRAM POSITIVE COCCI IN BOTH AEROBIC AND ANAEROBIC BOTTLES CRITICAL RESULT CALLED TO, READ BACK BY AND VERIFIED WITH: SHEEMA HALLAJI 07/03/16 @ 31  [place]    Culture (A)  Final    STAPHYLOCOCCUS CAPITIS SUSCEPTIBILITIES PERFORMED ON PREVIOUS CULTURE WITHIN THE LAST 5 DAYS. Performed at [facility], [place] [address] [place], [place] [address]    Report Status 07/06/2016 FINAL  Final  Blood Culture ID Panel (Reflexed)     Status: Abnormal   Collection Time: 07/02/16 11:47 AM  Result Value Ref Range Status   Enterococcus species NOT DETECTED NOT DETECTED Final   Listeria monocytogenes NOT DETECTED NOT DETECTED Final   Staphylococcus species DETECTED (A) NOT DETECTED Final    Comment: Methicillin (oxacillin) resistant coagulase negative staphylococcus. Possible blood culture contaminant (unless isolated from more than one blood culture draw or clinical case suggests pathogenicity). No antibiotic treatment is indicated for blood  culture contaminants. CRITICAL RESULT CALLED TO, READ BACK BY AND VERIFIED WITH: SHEEMA HALLAJI 07/03/16 @ 1540  [place]    Staphylococcus aureus NOT DETECTED NOT DETECTED Final   Methicillin resistance DETECTED (A) NOT DETECTED Final    Comment: CRITICAL RESULT CALLED TO, READ BACK BY AND VERIFIED WITH: SHEEMA HALLAJI 07/03/16 @ 1725  [facility]    Streptococcus species NOT DETECTED NOT DETECTED Final   Streptococcus agalactiae NOT DETECTED NOT DETECTED Final   Streptococcus pneumoniae NOT DETECTED NOT DETECTED Final   Streptococcus pyogenes NOT DETECTED NOT DETECTED Final   Acinetobacter baumannii NOT DETECTED NOT DETECTED Final   Enterobacteriaceae species NOT DETECTED NOT DETECTED Final   Enterobacter cloacae complex NOT DETECTED NOT DETECTED Final   Escherichia coli NOT DETECTED NOT DETECTED Final   Klebsiella oxytoca NOT DETECTED NOT DETECTED Final   Klebsiella  pneumoniae NOT DETECTED NOT DETECTED Final   Proteus species NOT DETECTED NOT DETECTED Final   Serratia marcescens NOT DETECTED NOT DETECTED Final   Haemophilus influenzae NOT DETECTED NOT DETECTED Final   Neisseria meningitidis NOT DETECTED NOT DETECTED Final   Pseudomonas aeruginosa NOT DETECTED NOT DETECTED Final   Candida albicans NOT DETECTED NOT DETECTED Final   Candida glabrata NOT DETECTED NOT DETECTED Final   Candida krusei NOT DETECTED NOT DETECTED Final   Candida parapsilosis NOT DETECTED NOT DETECTED Final   Candida tropicalis NOT DETECTED NOT DETECTED Final  MRSA PCR Screening     Status: None   Collection Time: 07/02/16  3:14 PM  Result Value Ref Range Status   MRSA by PCR NEGATIVE NEGATIVE Final  Comment:        The GeneXpert MRSA Assay (FDA approved for NASAL specimens only), is one component of a comprehensive MRSA colonization surveillance program. It is not intended to diagnose MRSA infection nor to guide or monitor treatment for MRSA infections.   Culture, expectorated sputum-assessment     Status: None   Collection Time: 07/03/16 12:23 AM  Result Value Ref Range Status   Specimen Description SPUTUM  Final   Special Requests NONE  Final   Sputum evaluation THIS SPECIMEN IS ACCEPTABLE FOR SPUTUM CULTURE  Final   Report Status 07/03/2016 FINAL  Final  Culture, respiratory (NON-Expectorated)     Status: None   Collection Time: 07/03/16 12:23 AM  Result Value Ref Range Status   Specimen Description SPUTUM  Final   Special Requests NONE Reflexed from [id number]  Final   Gram Stain   Final    MODERATE WBC PRESENT, PREDOMINANTLY PMN RARE YEAST FEW GRAM POSITIVE COCCI IN PAIRS Performed at [facility], [address] [place], [place] [address]    Culture FEW STREPTOCOCCUS PNEUMONIAE  Final   Report Status 07/05/2016 FINAL  Final   Organism ID, Bacteria STREPTOCOCCUS PNEUMONIAE  Final      Susceptibility   Streptococcus pneumoniae - MIC*     ERYTHROMYCIN >=8 RESISTANT Resistant     LEVOFLOXACIN >=16 RESISTANT Resistant     PENICILLIN (meningitis) <=0.06 SENSITIVE Sensitive     PENICILLIN (non-meningitis) <=0.06 SENSITIVE Sensitive     PENICILLIN (oral) <=0.06 SENSITIVE Sensitive     CEFTRIAXONE (non-meningitis) <=0.12 SENSITIVE Sensitive     CEFTRIAXONE (meningitis) <=0.12 SENSITIVE Sensitive     * FEW STREPTOCOCCUS PNEUMONIAE  Culture, blood (single) w Reflex to ID Panel     Status: None (Preliminary result)   Collection Time: 07/05/16 11:23 AM  Result Value Ref Range Status   Specimen Description BLOOD R HAND  Final   Special Requests   Final    BOTTLES DRAWN AEROBIC AND ANAEROBIC Blood Culture adequate volume   Culture NO GROWTH 4 DAYS  Final   Report Status PENDING  Incomplete     Studies/Results: Dg Chest Port 1 View  Result Date: 07/09/2016 CLINICAL DATA:  Respiratory failure. EXAM: PORTABLE CHEST 1 VIEW COMPARISON:  07/07/2016 FINDINGS: Left shoulder arthroplasty and lower cervical spine fixation. Mildly degraded exam due to AP portable technique and patient body habitus. New or increased right and persistent small left pleural effusion. No pneumothorax. Mild interstitial edema persists. Worsened right upper lobe airspace disease. Increased bibasilar airspace opacities. IMPRESSION: Overall worsened aeration, with increased interstitial edema and multifocal airspace opacities. Persistent left and new or increased layering right pleural effusion. Electronically Signed   By: Abigail Miyamoto M.D.   On: 07/09/2016 07:28    Assessment/Plan: Eugena Rhue is a 71 y.o. female with mod-severe COPD on Home O2 admitted with PNA with cx from sputum + Strep PNA and BCX 2/2 + CNS (Staph capitis).  She is on Bipap, wbc improving with ctx and azithromycin. Vanco added 4/19. She has no PPM or intravascular devices. [place] despite being 2/2 + seem to have been drawn at the same time and I suspect they were from the same blood draw. She is  not really at risk of coag neg staph bacteremia with no PPM, AICD or Portacath.  FU BCX is negative.  CXR still with impressive RUL changes Day 8 of effective abx therapy Recommendations Cont unasyn  Would rec a 10-14 day course of treatment but  does not have to be all IV.  Can change to augmentin at dc Monitor for empyema I would not suggest targeting treatment for the Coag negative staph so will dc vancomycin  Thank you very much for the consult. Will follow with you.  Gypsy Kellogg P   07/09/2016, 4:45 PM

## 2016-07-09 NOTE — Progress Notes (Addendum)
[facility] Progess Note    SYNOPSIS   71 y.o. F former smoker pt of pulmonologist Dr. Mortimer Fries admitted 04/16 with acute on chronic hypercapnic hypoxic respiratory failure secondary to AECOPD and right sided pneumonia requiring continuous BIpap.   ASSESSMENT/PLAN    PULMONARY A:Acute hypoxic respiratory failure with pneumococcal pneumonia, complicating severe COPD. Pt has had multiple exacerbations requiring hospitalization, she remains at high risk of progression of disease, intubation, and readmission for Copd exacerbation. She would benefit from non-invasive ventilatory therapy long term.  -Images personally reviewed; chest x-ray 07/09/16; right upper lobe infiltrate, which may be consistent with pneumonia. -Currently on BiPAP at 35%. P: -Continue to wean down/off BiPAP.    CARDIOVASCULAR A:essential hypertension , Currently blood pressure appears adequately controlled.  --Acute on Chronic volume overload. P: Continue diuresis.  Continue zaroxolyn.    RENAL A:-- INTAKE / OUTPUT:  Intake/Output Summary (Last 24 hours) at 07/09/16 0834 Last data filed at 07/09/16 0700  Gross per 24 hour  Intake            832.5 ml  Output             3750 ml  Net          -2917.5 ml    GASTROINTESTINAL A:  --  HEMATOLOGIC A:  -  INFECTIOUS A:  -- P:    Micro/culture results UC 07/05/16; negative Sputum: 07/03/16; positive for strep pneumonia; levofloxacin, resistant MRSA screen 4/16; negative  Antibiotics: 4/20 ceftriaxone >> 4/20 4/21 levofloxacin >>4/21 4/22 Unasyn >>  ENDOCRINE A:  Glucose controlled.  P:   --  NEUROLOGIC A:  --   MAJOR EVENTS/TEST RESULTS:   Best Practices  DVT Prophylaxis: enoxaparin.  GI Prophylaxis: famotidine.   --Daughter and husband updated at bedside.  ---------------------------------------   ----------------------------------------   Name: Hannah Powell MRN: [id number] DOB: Mar 25, 1945     ADMISSION DATE:  07/02/2016    SUBJECTIVE:   Pt currently on the bipap, can not provide history or review of systems. However she appears to be breathing comfortably.   Review of Systems:  --   VITAL SIGNS: Temp:  [97 F (36.1 C)-99.3 F (37.4 C)] 98.5 F (36.9 C) (04/23 0742) Pulse Rate:  [65-108] 108 (04/23 0800) Resp:  [14-41] 32 (04/23 0800) BP: (117-157)/(52-81) 125/73 (04/23 0500) SpO2:  [76 %-98 %] 94 % (04/23 0800) FiO2 (%):  [35 %-76 %] 35 % (04/23 0751) Weight:  [263 lb 3.7 oz (119.4 kg)] 263 lb 3.7 oz (119.4 kg) (04/22 1111)     PHYSICAL EXAMINATION: Physical Examination:   VS: BP 125/73   Pulse (!) 108   Temp 98.5 F (36.9 C) (Oral)   Resp (!) 32   Ht 5\' 5"  (1.651 m)   Wt 263 lb 3.7 oz (119.4 kg)   SpO2 94%   BMI 43.80 kg/m   General Appearance: No distress  Neuro:without focal findings, mental status normal. HEENT: PERRLA, EOM intact. Pulmonary: decreased air entry bilaterally.  CardiovascularNormal S1,S2.  No m/r/g.   Abdomen: Benign, Soft, non-tender. Renal:  No costovertebral tenderness  GU:  Not performed at this time. Endocrine: No evident thyromegaly. Skin:   warm, no rashes, no ecchymosis  Extremities: normal, no cyanosis, clubbing.    LABORATORY PANEL:   CBC  Recent Labs Lab 07/09/16 0501  WBC 7.6  HGB 13.5  HCT 43.5  PLT 118*    Chemistries   Recent Labs Lab 07/02/16 1127  07/09/16 0501  NA 134*  < >  144  K 4.4  < > 4.0  CL 97*  < > 98*  CO2 28  < > 41*  GLUCOSE 95  < > 96  BUN 58*  < > 11  CREATININE 2.92*  < > 0.66  CALCIUM 8.1*  < > 8.4*  MG  --   --  1.5*  PHOS  --   --  2.6  AST 25  --   --   ALT 14  --   --   ALKPHOS 108  --   --   BILITOT 1.0  --   --   < > = values in this interval not displayed.   Recent Labs Lab 07/06/16 0554 07/06/16 1559 07/06/16 2013 07/06/16 2357 07/07/16 0758 07/08/16 1113  GLUCAP 84 109* 114* 101* 92 70    Recent Labs Lab 07/04/16 1155  PHART 7.15*   PCO2ART 77*  PO2ART 58*    Recent Labs Lab 07/02/16 1127  AST 25  ALT 14  ALKPHOS 108  BILITOT 1.0  ALBUMIN 3.1*    Cardiac Enzymes  Recent Labs Lab 07/02/16 1127  TROPONINI 0.06*    RADIOLOGY:  Dg Chest Port 1 View  Result Date: 07/09/2016 CLINICAL DATA:  Respiratory failure. EXAM: PORTABLE CHEST 1 VIEW COMPARISON:  07/07/2016 FINDINGS: Left shoulder arthroplasty and lower cervical spine fixation. Mildly degraded exam due to AP portable technique and patient body habitus. New or increased right and persistent small left pleural effusion. No pneumothorax. Mild interstitial edema persists. Worsened right upper lobe airspace disease. Increased bibasilar airspace opacities. IMPRESSION: Overall worsened aeration, with increased interstitial edema and multifocal airspace opacities. Persistent left and new or increased layering right pleural effusion. Electronically Signed   By: Abigail Miyamoto M.D.   On: 07/09/2016 07:28        --Marda Stalker, MD.  ICU Pager: [phone] [facility] Office Number: [phone]   07/09/2016   Critical Care Attestation.  I have personally obtained a history, examined the patient, evaluated laboratory and imaging results, formulated the assessment and plan and placed orders. The Patient requires high complexity decision making for assessment and support, frequent evaluation and titration of therapies, application of advanced monitoring technologies and extensive interpretation of multiple databases. The patient has critical illness that could lead imminently to failure of 1 or more organ systems and requires the highest level of physician preparedness to intervene.  Critical Care Time devoted to patient care services described in this note is 35 minutes and is exclusive of time spent in procedures.

## 2016-07-09 NOTE — Progress Notes (Signed)
eLink Physician-Brief Progress Note Patient Name: Wende Longstreth DOB: Aug 22, 1945 MRN: [id number]   Date of Service  07/09/2016  HPI/Events of Note  Patient c/o headache. No relief with Tylenol PO. Creatinine = 0.66.  eICU Interventions  Will order Motrin 400 mg PO Q 4 hours PRN headache.      Intervention Category Intermediate Interventions: Pain - evaluation and management  Lysle Dingwall 07/09/2016, 5:33 PM

## 2016-07-10 ENCOUNTER — Inpatient Hospital Stay: Payer: Medicare HMO

## 2016-07-10 LAB — BLOOD GAS, ARTERIAL
ACID-BASE EXCESS: 23.4 mmol/L — AB (ref 0.0–2.0)
Bicarbonate: 54.3 mmol/L — ABNORMAL HIGH (ref 20.0–28.0)
DELIVERY SYSTEMS: POSITIVE
Expiratory PAP: 8
FIO2: 0.5
Inspiratory PAP: 14
O2 Saturation: 95.2 %
PATIENT TEMPERATURE: 37
PCO2 ART: 94 mmHg — AB (ref 32.0–48.0)
PH ART: 7.37 (ref 7.350–7.450)
pO2, Arterial: 79 mmHg — ABNORMAL LOW (ref 83.0–108.0)

## 2016-07-10 LAB — CULTURE, BLOOD (SINGLE)
Culture: NO GROWTH
SPECIAL REQUESTS: ADEQUATE

## 2016-07-10 LAB — MAGNESIUM: Magnesium: 1.7 mg/dL (ref 1.7–2.4)

## 2016-07-10 LAB — BASIC METABOLIC PANEL
ANION GAP: 6 (ref 5–15)
BUN: 11 mg/dL (ref 6–20)
CALCIUM: 8.2 mg/dL — AB (ref 8.9–10.3)
CO2: 49 mmol/L — ABNORMAL HIGH (ref 22–32)
Chloride: 87 mmol/L — ABNORMAL LOW (ref 101–111)
Creatinine, Ser: 0.8 mg/dL (ref 0.44–1.00)
GLUCOSE: 105 mg/dL — AB (ref 65–99)
Potassium: 3.5 mmol/L (ref 3.5–5.1)
SODIUM: 142 mmol/L (ref 135–145)

## 2016-07-10 LAB — CBC
HCT: 40.8 % (ref 35.0–47.0)
Hemoglobin: 13.3 g/dL (ref 12.0–16.0)
MCH: 30.9 pg (ref 26.0–34.0)
MCHC: 32.6 g/dL (ref 32.0–36.0)
MCV: 94.6 fL (ref 80.0–100.0)
PLATELETS: 105 10*3/uL — AB (ref 150–440)
RBC: 4.31 MIL/uL (ref 3.80–5.20)
RDW: 16 % — AB (ref 11.5–14.5)
WBC: 7.9 10*3/uL (ref 3.6–11.0)

## 2016-07-10 MED ORDER — METHYLPREDNISOLONE SODIUM SUCC 125 MG IJ SOLR
60.0000 mg | Freq: Once | INTRAMUSCULAR | Status: AC
  Administered 2016-07-10: 60 mg via INTRAVENOUS
  Filled 2016-07-10: qty 2

## 2016-07-10 MED ORDER — HYDROCOD POLST-CPM POLST ER 10-8 MG/5ML PO SUER
5.0000 mL | Freq: Two times a day (BID) | ORAL | Status: DC
  Administered 2016-07-10 – 2016-07-15 (×10): 5 mL via ORAL
  Filled 2016-07-10 (×10): qty 5

## 2016-07-10 MED ORDER — SODIUM CHLORIDE 0.9 % IV SOLN
30.0000 meq | Freq: Once | INTRAVENOUS | Status: AC
  Administered 2016-07-10: 30 meq via INTRAVENOUS
  Filled 2016-07-10: qty 15

## 2016-07-10 MED ORDER — PANTOPRAZOLE SODIUM 40 MG IV SOLR
40.0000 mg | INTRAVENOUS | Status: DC
  Administered 2016-07-10 – 2016-07-12 (×3): 40 mg via INTRAVENOUS
  Filled 2016-07-10 (×3): qty 40

## 2016-07-10 MED ORDER — METHYLPREDNISOLONE SODIUM SUCC 125 MG IJ SOLR: 60.0000 mg | INTRAMUSCULAR | Status: DC

## 2016-07-10 MED ORDER — MAGNESIUM SULFATE 2 GM/50ML IV SOLN
2.0000 g | Freq: Once | INTRAVENOUS | Status: AC
  Administered 2016-07-10: 2 g via INTRAVENOUS
  Filled 2016-07-10: qty 50

## 2016-07-10 MED ORDER — FUROSEMIDE 10 MG/ML IJ SOLN
20.0000 mg | Freq: Every day | INTRAMUSCULAR | Status: DC
  Administered 2016-07-10 – 2016-07-12 (×2): 20 mg via INTRAVENOUS
  Filled 2016-07-10 (×3): qty 2

## 2016-07-10 NOTE — Progress Notes (Signed)
Pharmacy Antibiotic Note/ Electrolyte Monitoring  Hannah Powell is a 71 y.o. female admitted on 07/02/2016 with  pneumonia.  Pharmacy has been consulted for Unasyn dosing as well as for electrolyte monitoring and replacement.   Plan: 1. Continue Unasyn 3 g iv q 6 hours. Plan is for 10 day course of antibiotics per ID.   2. Magnesium sulfate 2 g iv once and KCl 30 meq IV once as patient continues on Lasix and is unable to take oral medications currently.   Height: 5\' 5"  (165.1 cm) Weight: 263 lb 3.7 oz (119.4 kg) IBW/kg (Calculated) : 57  Temp (24hrs), Avg:98.1 F (36.7 C), Min:97.5 F (36.4 C), Max:98.6 F (37 C)   Recent Labs Lab 07/04/16 0419 07/05/16 0345 07/07/16 0447 07/09/16 0501 07/10/16 0439  WBC  --  13.4* 7.5 7.6 7.9  CREATININE 2.08* 1.10* 0.75 0.66 0.80    Estimated Creatinine Clearance: 85.9 mL/min (by C-G formula based on SCr of 0.8 mg/dL).    BMP Latest Ref Rng & Units 07/10/2016 07/09/2016 07/07/2016  Glucose 65 - 99 mg/dL 105(H) 96 104(H)  BUN 6 - 20 mg/dL 11 11 16   Creatinine 0.44 - 1.00 mg/dL 0.80 0.66 0.75  Sodium 135 - 145 mmol/L 142 144 143  Potassium 3.5 - 5.1 mmol/L 3.5 4.0 3.9  Chloride 101 - 111 mmol/L 87(L) 98(L) 105  CO2 22 - 32 mmol/L 49(H) 41(H) 33(H)  Calcium 8.9 - 10.3 mg/dL 8.2(L) 8.4(L) 8.4(L)   Magnesium (mg/dL)  Date Value  07/10/2016 1.7  01/04/2013 2.0   Phosphorus (mg/dL)  Date Value  07/09/2016 2.6   Albumin (g/dL)  Date Value  07/02/2016 3.1 (L)  06/30/2014 3.8     Allergies  Allergen Reactions  . Lyrica [Pregabalin] Other (See Comments)    Reaction: made Restless Leg Syndrome worse     Antimicrobials this admission: 4/20 ceftriaxone >> 4/20 4/21 levofloxacin >>4/21 4/22 Unasyn >>  Dose adjustments this admission:  Microbiology results: 4/17 BCx: STAPHYLOCOCCUS CAPITIS 4/18 BX: NG x 3 days  4/17 Sputum: STREPTOCOCCUS PNEUMONIAE 4/17 MRSA PCR: negative   Thank you for allowing pharmacy to be a part of  this patient's care.  Napoleon Form, PharmD, BCPS Clinical Pharmacist 07/10/2016 2:57 PM

## 2016-07-10 NOTE — Progress Notes (Signed)
Pt at 13:45 showed signs of decreased level of consciousness.  Family at bedside also confirmed that pt had been getting more confused throughout the day. Dr. Mortimer Fries alerted of pt status change and verbal orders given to restart BiPAP. Will continue to assess and monitor.

## 2016-07-10 NOTE — Progress Notes (Signed)
SLP Cancellation Note  Patient Details Name: Crisol Muecke MRN: [id number] DOB: [birth year]   Cancelled treatment:       Reason Eval/Treat Not Completed: Medical issues which prohibited therapy (chart reviewed; consulted NSG re: pt's status). NSG indicated SLP would need to hold on BSE d/t pt's respiratory status at this time; pt is currently on HFNC for support. ST will f/u w/ BSE tomorrow.    Orinda Kenner, MS, CCC-SLP Watson,Katherine 07/10/2016, 1:54 PM

## 2016-07-10 NOTE — Evaluation (Signed)
Physical Therapy Re-Evaluation Patient Details Name: Hannah Powell MRN: [id number] DOB: 04-Apr-1945 Today's Date: 07/10/2016   History of Present Illness  Pt admitted to the CCU 07/02/16 for acute respiratory failure with hypoxia on continuous BiPAP. Pt moved to med surg on 07/03/16 on 3L O2. Pt moved to CCU on 07/04/16 after RR called for acute on chronic hypercapnic respiratory failure requiring continuous BiPAP. Pt returned to med surg on 4/21, but moved back to CCU on 4/22 for increased respiratory distress with hypoxemia and back on BiPAP. Pt now on HFNC. Pt has PMH of anxiety, arthritis, CHF, COPD, HTN, hypothyroidism, restless leg syndrome, sleep apnea, s/p back surgery (cervical), foot arthroplasty, L total shoulder arthroplasty, and TKR. Pt wears 3L of O2 PRN at baseline.   Clinical Impression  Pt in bed watching TV upon entry. Pt very pleasant and agreeable to PT evaluation. Pt presents with generalized weakness. Pt required multiple rest breaks and pursed lip breathing during eval d/t coughing spells and O2 sats dropping to 88% on HFNC. O2 sat would return to >90% with a few deep breaths. During bed level exercises, pt's O2 sats dropped to 77% and respiratory rate was 42. Exercises were stopped and it took ~2 min of pursed lip breathing to return pt's O2 sats to >90% and 20 breaths per min (RN notified). Pt would benefit from skilled PT to increase strength, activity tolerance, and functional mobility. Recommend d/c to SNF for deficits mentioned above and to improve functional mobility to PLOF.    Follow Up Recommendations SNF    Equipment Recommendations  Rolling walker with 5" wheels;3in1 (PT) (Pt has 3in1 at home)    Recommendations for Other Services       Precautions / Restrictions Precautions Precautions: Fall Restrictions Weight Bearing Restrictions: No      Mobility  Bed Mobility               General bed mobility comments: Not assessed d/t pt respiratory  status  Transfers                 General transfer comment: Not assessed d/t pt respiratory status  Ambulation/Gait                Stairs            Wheelchair Mobility    Modified Rankin (Stroke Patients Only)       Balance                                             Pertinent Vitals/Pain Pain Assessment: No/denies pain  Nursing reports pt on high flow nasal cannula at 75%.    Home Living Family/patient expects to be discharged to:: Private residence Living Arrangements: Spouse/significant other Available Help at Discharge: Available 24 hours/day Type of Home: House Home Access: Stairs to enter Entrance Stairs-Rails: Can reach both Entrance Stairs-Number of Steps: 5 Home Layout: One level Home Equipment: Crutches;Walker - standard;Bedside commode      Prior Function Level of Independence: Independent with assistive device(s)         Comments: ambulates using loftstrand crutches. Reports she typically only ambulates 10-12' in home. She then reports she is able to ambulate outside as well. Pt is poor historian. (Per prior PT note)     Hand Dominance        Extremity/Trunk Assessment   Upper Extremity  Assessment Upper Extremity Assessment: Generalized weakness (strength grossly at least 3/5 with fair grip strength)    Lower Extremity Assessment Lower Extremity Assessment: Generalized weakness (B LE grossly at least 3/5 strength)       Communication   Communication: Other (comment) (Pt intermittently struggles to find words and perseverates with the word ibuprophen )  Cognition Arousal/Alertness: Awake/alert Behavior During Therapy: WFL for tasks assessed/performed Overall Cognitive Status: Impaired/Different from baseline Area of Impairment: Orientation                 Orientation Level: Disoriented to;Time;Place             General Comments: Pt could recall her name, but not her birthday, today's  date, or which hospital she was in      General Comments      Exercises General Exercises - Lower Extremity Ankle Circles/Pumps: AROM;Strengthening;Both;10 reps;Supine Heel Slides: AROM;Strengthening;Both;10 reps;Supine Hip ABduction/ADduction: AROM;Right;Supine;Strengthening (3 reps) Straight Leg Raises: AROM;Strengthening;Both;10 reps;Supine   Assessment/Plan    PT Assessment Patient needs continued PT services  PT Problem List Decreased strength;Decreased mobility;Decreased activity tolerance;Decreased cognition;Cardiopulmonary status limiting activity;Decreased balance       PT Treatment Interventions DME instruction;Therapeutic activities;Gait training;Therapeutic exercise;Patient/family education;Stair training;Balance training;Functional mobility training    PT Goals (Current goals can be found in the Care Plan section)  Acute Rehab PT Goals Patient Stated Goal: to get stronger and go back home PT Goal Formulation: With patient Time For Goal Achievement: 07/10/16 Potential to Achieve Goals: Fair    Frequency Min 2X/week   Barriers to discharge Inaccessible home environment      Co-evaluation               End of Session Equipment Utilized During Treatment: Oxygen (high flow 75%) Activity Tolerance: Treatment limited secondary to medical complications (Comment) (Limited by respiratory rate and O2 desaturation) Patient left: in bed;with call bell/phone within reach;with bed alarm set Nurse Communication: Other (comment) (O2 sats and respiratory rate) PT Visit Diagnosis: Difficulty in walking, not elsewhere classified (R26.2);Muscle weakness (generalized) (M62.81)    Time: 1027-2536 PT Time Calculation (min) (ACUTE ONLY): 26 min   Charges:         PT G Codes:        Marzell Allemand, SPT 07/10/2016, 2:45 PM

## 2016-07-10 NOTE — Progress Notes (Signed)
[facility] INFECTIOUS DISE[facility] PROGRESS NOTE Date of Admission:  07/02/2016     ID: Hannah Powell is a 71 y.o. female with strep PNA and coag neg staph bacteremia Active Problems:   Acute respiratory failure with hypoxia (HCC)   Pressure injury of skin   Subjective: No fevers, wbc remains stable. Off and on bipap  ROS  Eleven systems are reviewed and negative except per hpi  Medications:  Antibiotics Given (last 72 hours)    None     . aspirin EC  81 mg Oral Daily  . benzonatate  200 mg Oral TID  . budesonide (PULMICORT) nebulizer solution  0.25 mg Nebulization Q6H  . chlorhexidine  15 mL Mouth Rinse BID  . chlorpheniramine-HYDROcodone  5 mL Oral Q12H  . enoxaparin (LOVENOX) injection  40 mg Subcutaneous Q12H  . furosemide  20 mg Intravenous Daily  . furosemide  40 mg Intravenous Q12H  . ipratropium-albuterol  3 mL Nebulization Q6H  . levothyroxine  200 mcg Oral QAC breakfast  . losartan  25 mg Oral Daily  . metolazone  5 mg Oral Daily  . pantoprazole (PROTONIX) IV  40 mg Intravenous Q24H  . pramipexole  4 mg Oral QHS  . traZODone  100 mg Oral QHS    Objective: Vital signs in last 24 hours: Temp:  [97.5 F (36.4 C)-98.6 F (37 C)] 98.6 F (37 C) (04/24 1200) Pulse Rate:  [80-164] 110 (04/24 1300) Resp:  [17-42] 42 (04/24 1300) BP: (101-175)/(49-162) 101/92 (04/24 1300) SpO2:  [56 %-100 %] 91 % (04/24 1300) FiO2 (%):  [50 %-75 %] 75 % (04/24 0815) Constitutional:  Obese,  Mild  labored breathing, on bipap  HENT: [place]/AT, PERRLA, no scleral icterus Mouth/Throat: Oropharynx is clear and moist. No oropharyngeal exudate.  Cardiovascular: Normal rate, regular rhythm and normal heart sounds. Exam reveals no gallop .  Pulmonary/Chest: poor air movement, rhonchi Neck = supple, no nuchal rigidity Abdominal: Soft. Bowel sounds are normal.  exhibits no distension. There is no tenderness.  Lymphadenopathy: no cervical adenopathy. No axillary adenopathy Neurological:  alert and oriented to person, place, and time.  Skin: Skin is warm and dry. No rash noted. No erythema.  Psychiatric: a normal mood and affect.  behavior is normal.   Lab Results  Recent Labs  07/09/16 0501 07/10/16 0439  WBC 7.6 7.9  HGB 13.5 13.3  HCT 43.5 40.8  NA 144 142  K 4.0 3.5  CL 98* 87*  CO2 41* 49*  BUN 11 11  CREATININE 0.66 0.80    Microbiology: Results for orders placed or performed during the hospital encounter of 07/02/16  Blood culture (routine x 2)     Status: Abnormal   Collection Time: 07/02/16 11:47 AM  Result Value Ref Range Status   Specimen Description BLOOD LEFT ARM  Final   Special Requests   Final    BOTTLES DRAWN AEROBIC AND ANAEROBIC Blood Culture results may not be optimal due to an excessive volume of blood received in culture bottles   Culture  Setup Time   Final    GRAM POSITIVE COCCI IN CLUSTERS ANAEROBIC BOTTLE ONLY CRITICAL VALUE NOTED.  VALUE IS CONSISTENT WITH PREVIOUSLY REPORTED AND CALLED VALUE. Performed at [facility], [place] [address], [place], [place] [address]    Culture STAPHYLOCOCCUS CAPITIS (A)  Final   Report Status 07/07/2016 FINAL  Final   Organism ID, Bacteria STAPHYLOCOCCUS CAPITIS  Final      Susceptibility   Staphylococcus capitis -  MIC*    CIPROFLOXACIN 4 RESISTANT Resistant     ERYTHROMYCIN >=8 RESISTANT Resistant     GENTAMICIN <=0.5 SENSITIVE Sensitive     OXACILLIN >=4 RESISTANT Resistant     TETRACYCLINE <=1 SENSITIVE Sensitive     VANCOMYCIN <=0.5 SENSITIVE Sensitive     TRIMETH/SULFA <=10 SENSITIVE Sensitive     CLINDAMYCIN INTERMEDIATE Intermediate     RIFAMPIN <=0.5 SENSITIVE Sensitive     Inducible Clindamycin NEGATIVE Sensitive     * STAPHYLOCOCCUS CAPITIS  Blood culture (routine x 2)     Status: Abnormal   Collection Time: 07/02/16 11:47 AM  Result Value Ref Range Status   Specimen Description BLOOD RIGHT ARM  Final   Special Requests   Final    BOTTLES DRAWN AEROBIC AND ANAEROBIC Blood  Culture adequate volume   Culture  Setup Time   Final    GRAM POSITIVE COCCI IN BOTH AEROBIC AND ANAEROBIC BOTTLES CRITICAL RESULT CALLED TO, READ BACK BY AND VERIFIED WITH: SHEEMA HALLAJI 07/03/16 @ 54  [place]    Culture (A)  Final    STAPHYLOCOCCUS CAPITIS SUSCEPTIBILITIES PERFORMED ON PREVIOUS CULTURE WITHIN THE LAST 5 DAYS. Performed at [facility], [place] [address], [place], [place] [address]    Report Status 07/06/2016 FINAL  Final  Blood Culture ID Panel (Reflexed)     Status: Abnormal   Collection Time: 07/02/16 11:47 AM  Result Value Ref Range Status   Enterococcus species NOT DETECTED NOT DETECTED Final   Listeria monocytogenes NOT DETECTED NOT DETECTED Final   Staphylococcus species DETECTED (A) NOT DETECTED Final    Comment: Methicillin (oxacillin) resistant coagulase negative staphylococcus. Possible blood culture contaminant (unless isolated from more than one blood culture draw or clinical case suggests pathogenicity). No antibiotic treatment is indicated for blood  culture contaminants. CRITICAL RESULT CALLED TO, READ BACK BY AND VERIFIED WITH: SHEEMA HALLAJI 07/03/16 @ 0272  [place]    Staphylococcus aureus NOT DETECTED NOT DETECTED Final   Methicillin resistance DETECTED (A) NOT DETECTED Final    Comment: CRITICAL RESULT CALLED TO, READ BACK BY AND VERIFIED WITH: SHEEMA HALLAJI 07/03/16 @ 1725  [facility]    Streptococcus species NOT DETECTED NOT DETECTED Final   Streptococcus agalactiae NOT DETECTED NOT DETECTED Final   Streptococcus pneumoniae NOT DETECTED NOT DETECTED Final   Streptococcus pyogenes NOT DETECTED NOT DETECTED Final   Acinetobacter baumannii NOT DETECTED NOT DETECTED Final   Enterobacteriaceae species NOT DETECTED NOT DETECTED Final   Enterobacter cloacae complex NOT DETECTED NOT DETECTED Final   Escherichia coli NOT DETECTED NOT DETECTED Final   Klebsiella oxytoca NOT DETECTED NOT DETECTED Final   Klebsiella pneumoniae NOT DETECTED NOT DETECTED Final    Proteus species NOT DETECTED NOT DETECTED Final   Serratia marcescens NOT DETECTED NOT DETECTED Final   Haemophilus influenzae NOT DETECTED NOT DETECTED Final   Neisseria meningitidis NOT DETECTED NOT DETECTED Final   Pseudomonas aeruginosa NOT DETECTED NOT DETECTED Final   Candida albicans NOT DETECTED NOT DETECTED Final   Candida glabrata NOT DETECTED NOT DETECTED Final   Candida krusei NOT DETECTED NOT DETECTED Final   Candida parapsilosis NOT DETECTED NOT DETECTED Final   Candida tropicalis NOT DETECTED NOT DETECTED Final  MRSA PCR Screening     Status: None   Collection Time: 07/02/16  3:14 PM  Result Value Ref Range Status   MRSA by PCR NEGATIVE NEGATIVE Final    Comment:        The GeneXpert MRSA Assay (FDA  approved for NASAL specimens only), is one component of a comprehensive MRSA colonization surveillance program. It is not intended to diagnose MRSA infection nor to guide or monitor treatment for MRSA infections.   Culture, expectorated sputum-assessment     Status: None   Collection Time: 07/03/16 12:23 AM  Result Value Ref Range Status   Specimen Description SPUTUM  Final   Special Requests NONE  Final   Sputum evaluation THIS SPECIMEN IS ACCEPTABLE FOR SPUTUM CULTURE  Final   Report Status 07/03/2016 FINAL  Final  Culture, respiratory (NON-Expectorated)     Status: None   Collection Time: 07/03/16 12:23 AM  Result Value Ref Range Status   Specimen Description SPUTUM  Final   Special Requests NONE Reflexed from [id number]  Final   Gram Stain   Final    MODERATE WBC PRESENT, PREDOMINANTLY PMN RARE YEAST FEW GRAM POSITIVE COCCI IN PAIRS Performed at [facility], [address], [place], [place] [address]    Culture FEW STREPTOCOCCUS PNEUMONIAE  Final   Report Status 07/05/2016 FINAL  Final   Organism ID, Bacteria STREPTOCOCCUS PNEUMONIAE  Final      Susceptibility   Streptococcus pneumoniae - MIC*    ERYTHROMYCIN >=8 RESISTANT Resistant     LEVOFLOXACIN  >=16 RESISTANT Resistant     PENICILLIN (meningitis) <=0.06 SENSITIVE Sensitive     PENICILLIN (non-meningitis) <=0.06 SENSITIVE Sensitive     PENICILLIN (oral) <=0.06 SENSITIVE Sensitive     CEFTRIAXONE (non-meningitis) <=0.12 SENSITIVE Sensitive     CEFTRIAXONE (meningitis) <=0.12 SENSITIVE Sensitive     * FEW STREPTOCOCCUS PNEUMONIAE  Culture, blood (single) w Reflex to ID Panel     Status: None   Collection Time: 07/05/16 11:23 AM  Result Value Ref Range Status   Specimen Description BLOOD R HAND  Final   Special Requests   Final    BOTTLES DRAWN AEROBIC AND ANAEROBIC Blood Culture adequate volume   Culture NO GROWTH 5 DAYS  Final   Report Status 07/10/2016 FINAL  Final     Studies/Results: Dg Chest 1 View  Result Date: 07/10/2016 CLINICAL DATA:  Shortness of breath. EXAM: CHEST 1 VIEW COMPARISON:  07/09/2016. FINDINGS: Stable cardiomegaly. Persistent multifocal pulmonary infiltrates particularly the right upper lung. Atelectatic changes right upper lung. Persistent right apical pleural thickening. Small right pleural effusion. Interim improvement of left pleural effusion. Prior cervical spine fusion. Left shoulder replacement. Degenerative changes right shoulder. IMPRESSION: 1. Persistent multifocal pulmonary infiltrates particularly in the right upper lung. Persistent atelectatic changes right upper lung. Persistent right apical pleural thickening. 2. Small right pleural effusion. Interim improvement of left pleural effusion . Electronically Signed   By: Marcello Moores  Register   On: 07/10/2016 06:54   Dg Chest Port 1 View  Result Date: 07/09/2016 CLINICAL DATA:  Respiratory failure. EXAM: PORTABLE CHEST 1 VIEW COMPARISON:  07/07/2016 FINDINGS: Left shoulder arthroplasty and lower cervical spine fixation. Mildly degraded exam due to AP portable technique and patient body habitus. New or increased right and persistent small left pleural effusion. No pneumothorax. Mild interstitial edema  persists. Worsened right upper lobe airspace disease. Increased bibasilar airspace opacities. IMPRESSION: Overall worsened aeration, with increased interstitial edema and multifocal airspace opacities. Persistent left and new or increased layering right pleural effusion. Electronically Signed   By: Abigail Miyamoto M.D.   On: 07/09/2016 07:28    Assessment/Plan: Kambra Beachem is a 71 y.o. female with mod-severe COPD on Home O2 admitted with PNA with cx from sputum + Strep PNA  and BCX 2/2 + CNS (Staph capitis).  She is on Bipap, wbc improving with ctx and azithromycin. Vanco added 4/19. She has no PPM or intravascular devices. [place] despite being 2/2 + seem to have been drawn at the same time and I suspect they were from the same blood draw. She is not really at risk of coag neg staph bacteremia with no PPM, AICD or Portacath.  FU BCX is negative.  CXR still with impressive RUL changes  Day 9 of effective abx therapy Recommendations Cont unasyn  Would rec a 10-14 day course of treatment but does not have to be all IV.  Can change to augmentin at dc Monitor for empyema I would not suggest targeting treatment for the Coag negative staph so will dc vancomycin  Thank you very much for the consult. Will follow with you.  Brode Sculley P   07/10/2016, 1:57 PM

## 2016-07-10 NOTE — Progress Notes (Signed)
[facility] [place] NAME: Hannah Powell    MR#:  [id number]  DATE OF BIRTH:  1945/09/05  SUBJECTIVE:  On BIPAP at present Son in the room  REVIEW OF SYSTEMS:   Review of Systems  Constitutional: Negative for chills, fever and weight loss.  HENT: Negative for ear discharge, ear pain and nosebleeds.   Eyes: Negative for blurred vision, pain and discharge.  Respiratory: Positive for shortness of breath. Negative for sputum production, wheezing and stridor.   Cardiovascular: Negative for chest pain, palpitations, orthopnea and PND.  Gastrointestinal: Negative for abdominal pain, diarrhea, nausea and vomiting.  Genitourinary: Negative for frequency and urgency.  Musculoskeletal: Negative for back pain and joint pain.  Neurological: Positive for weakness. Negative for sensory change, speech change and focal weakness.  Psychiatric/Behavioral: Negative for depression and hallucinations. The patient is not nervous/anxious.    Tolerating Diet:yes Tolerating PT: pending  DRUG ALLERGIES:   Allergies  Allergen Reactions  . Lyrica [Pregabalin] Other (See Comments)    Reaction: made Restless Leg Syndrome worse     VITALS:  Blood pressure (!) 103/53, pulse 94, temperature 97.8 F (36.6 C), temperature source Axillary, resp. rate (!) 33, height 5\' 5"  (1.651 m), weight 119.4 kg (263 lb 3.7 oz), SpO2 98 %.  PHYSICAL EXAMINATION:   Physical Exam  GENERAL:  71 y.o.-year-old patient lying in the bed with moderate  distress. Obese EYES: Pupils equal, round, reactive to light and accommodation. No scleral icterus. Extraocular muscles intact.  HEENT: Head atraumatic, normocephalic. Oropharynx and nasopharynx clear. BIPAP+ cyanotic lips NECK:  Supple, no jugular venous distention. No thyroid enlargement, no tenderness.  LUNGS:distant breath sounds bilaterally, no wheezing,++ rales,no rhonchi. Shallow breathing CARDIOVASCULAR: S1, S2 normal. No  murmurs, rubs, or gallops.  ABDOMEN: Soft, nontender, nondistended. Bowel sounds present. No organomegaly or mass.  EXTREMITIES: No cyanosis, clubbing or edema b/l.    NEUROLOGIC: Cranial nerves II through XII are intact. No focal Motor or sensory deficits b/l.   PSYCHIATRIC:  patient is alert and oriented x 3. But anxious  SKIN: No obvious rash, lesion, or ulcer.   LABORATORY PANEL:  CBC  Recent Labs Lab 07/10/16 0439  WBC 7.9  HGB 13.3  HCT 40.8  PLT 105*    Chemistries   Recent Labs Lab 07/10/16 0439  NA 142  K 3.5  CL 87*  CO2 49*  GLUCOSE 105*  BUN 11  CREATININE 0.80  CALCIUM 8.2*  MG 1.7   Cardiac Enzymes No results for input(s): TROPONINI in the last 168 hours. RADIOLOGY:  Dg Chest 1 View  Result Date: 07/10/2016 CLINICAL DATA:  Shortness of breath. EXAM: CHEST 1 VIEW COMPARISON:  07/09/2016. FINDINGS: Stable cardiomegaly. Persistent multifocal pulmonary infiltrates particularly the right upper lung. Atelectatic changes right upper lung. Persistent right apical pleural thickening. Small right pleural effusion. Interim improvement of left pleural effusion. Prior cervical spine fusion. Left shoulder replacement. Degenerative changes right shoulder. IMPRESSION: 1. Persistent multifocal pulmonary infiltrates particularly in the right upper lung. Persistent atelectatic changes right upper lung. Persistent right apical pleural thickening. 2. Small right pleural effusion. Interim improvement of left pleural effusion . Electronically Signed   By: Marcello Moores  Register   On: 07/10/2016 06:54   Dg Chest Port 1 View  Result Date: 07/09/2016 CLINICAL DATA:  Respiratory failure. EXAM: PORTABLE CHEST 1 VIEW COMPARISON:  07/07/2016 FINDINGS: Left shoulder arthroplasty and lower cervical spine fixation. Mildly degraded exam due to AP portable technique and patient body  habitus. New or increased right and persistent small left pleural effusion. No pneumothorax. Mild interstitial edema  persists. Worsened right upper lobe airspace disease. Increased bibasilar airspace opacities. IMPRESSION: Overall worsened aeration, with increased interstitial edema and multifocal airspace opacities. Persistent left and new or increased layering right pleural effusion. Electronically Signed   By: Abigail Miyamoto M.D.   On: 07/09/2016 07:28   ASSESSMENT AND PLAN:  Hannah Powell a 71 y.o.femalepresented with respiratory distress. She stated on Thursday she started feeling bad. She needed to increase her oxygen from 2 L to 3 L. On Saturday she had shaking chills and fever.   1. Acute on chronic hypoxic/hypercarbic respiratory failure with hypoxia and acidosis.  -Appears combination of severe COPD and CHF acute diastolic (Echo in the past EF 65%) - Patient is back on BiPAP due to change in mentation and CO2 narcosis -IV lasix ---good diureses (-) by 7 liters  2. Clinical sepsis with Right-sided pneumonia, leukocytosis and tachypnea. -sputum culture strept pneumonia resistant to levofloxacin--per ID use oral penicillin. -will give IV unasyn now that she is will require BIPAP  3. Acute kidney injury due to ATN from pneumonia and sepsis. -creat improving Baseline creat 0.75 in 01/2016 Came in with 2.53--- 2.08--1.10--0.75  4. Acute on chronic COPD on chronic oxygen. -management as above  5. Sleep apnea on CPAP at night    6. Essential hypertension blood pressure on the lower side. IV fluid hydration  7. Hypothyroidism unspecified on levothyroxine  8. Restless leg syndrome on Mirapex  Spoke with son in the room  Case discussed with Care Management/Social Worker. Management plans discussed with the patient, family and they are in agreement.  CODE STATUS: DNR  DVT Prophylaxis: lovenox  TOTAL TIME TAKING CARE OF THIS PATIENT: 30 minutes.  >50% time spent on counselling and coordination of care   Note: This dictation was prepared with Dragon dictation along with smaller phrase  technology. Any transcriptional errors that result from this process are unintentional.  Albaraa Swingle M.D on 07/10/2016 at 8:41 PM  Between 7am to 6pm - Pager - [phone]  After 6pm go to [email or web address] - password [email or web address] [facility]  Office  [phone]  CC: Primary care physician; Adin Hector, MD

## 2016-07-10 NOTE — Progress Notes (Signed)
[facility] Progess Note    SYNOPSIS   71 y.o. F former smoker pt of pulmonologist Dr. Mortimer Fries admitted 04/16 with acute on chronic hypercapnic hypoxic respiratory failure secondary to AECOPD and right sided pneumonia requiring continuous Bipap.   ASSESSMENT/PLAN    PULMONARY A:Acute hypoxic respiratory failure with pneumococcal pneumonia, complicating severe COPD. Pt has acute on chronic hypoxic and hypercapnic respiratory failure, she has had multiple exacerbations requiring hospitalization, she remains at high risk of progression of disease, intubation, and readmission for Copd exacerbation. She would benefit from non-invasive ventilatory therapy long term.  -Images personally reviewed; chest x-ray 07/10/16; continued right upper lobe infiltrate, which may be consistent with pneumonia. -ABG, 7.37/94/79/54.3. Consistent with compensated chronic hypercapnic respiratory failure. Likely secondary to severe COPD, as well as some element of obesity hypoventilation syndrome. -Currently on BiPAP at 50% this am. P: -Bipap qhs, d/w Education officer, museum re: obtaining home trilogy ventilator.  -Avoid narcotics.  CARDIOVASCULAR A:essential hypertension , Currently blood pressure appears adequately controlled.  --Acute on Chronic volume overload. P: Continue diuresis.  Continue zaroxolyn.    RENAL A:-- good urine output yesterday.  INTAKE / OUTPUT:  Intake/Output Summary (Last 24 hours) at 07/10/16 0751 Last data filed at 07/10/16 0600  Gross per 24 hour  Intake           865.83 ml  Output             6785 ml  Net         -5919.17 ml    GASTROINTESTINAL A:  --  HEMATOLOGIC A:  -  INFECTIOUS A:  -- P:    Micro/culture results UC 07/05/16; negative Sputum: 07/03/16; positive for strep pneumonia; levofloxacin, resistant MRSA screen 4/16; negative  Antibiotics: 4/20 ceftriaxone >> 4/20 4/21 levofloxacin >>4/21 4/22 Unasyn >>  ENDOCRINE A:  Glucose  controlled.  P:   --  NEUROLOGIC A:  --   MAJOR EVENTS/TEST RESULTS:   Best Practices  DVT Prophylaxis: enoxaparin.  GI Prophylaxis: famotidine.   --Daughter and husband updated at bedside.  ---------------------------------------   ----------------------------------------   Name: Amanat Hackel MRN: [id number] DOB: 10/13/1945    ADMISSION DATE:  07/02/2016    SUBJECTIVE:   Pt currently on the bipap, can not provide history or review of systems. However she appears to be breathing comfortably.   Review of Systems:  --   VITAL SIGNS: Temp:  [97.5 F (36.4 C)-98.6 F (37 C)] 97.9 F (36.6 C) (04/24 0200) Pulse Rate:  [80-108] 91 (04/24 0730) Resp:  [15-37] 24 (04/24 0730) BP: (101-163)/(49-121) 114/65 (04/24 0700) SpO2:  [76 %-100 %] 97 % (04/24 0730) FiO2 (%):  [50 %-55 %] 50 % (04/24 0128)     PHYSICAL EXAMINATION: Physical Examination:   VS: BP 114/65   Pulse 91   Temp 97.9 F (36.6 C) (Axillary)   Resp (!) 24   Ht 5\' 5"  (1.651 m)   Wt 263 lb 3.7 oz (119.4 kg)   SpO2 97%   BMI 43.80 kg/m   General Appearance: No distress  Neuro:without focal findings, mental status normal. HEENT: PERRLA, EOM intact. Pulmonary: decreased air entry bilaterally.  CardiovascularNormal S1,S2.  No m/r/g.   Abdomen: Benign, Soft, non-tender. Renal:  No costovertebral tenderness  GU:  Not performed at this time. Endocrine: No evident thyromegaly. Skin:   warm, no rashes, no ecchymosis  Extremities: normal, no cyanosis, clubbing.    LABORATORY PANEL:   CBC  Recent Labs Lab 07/10/16 0439  WBC  7.9  HGB 13.3  HCT 40.8  PLT 105*    Chemistries   Recent Labs Lab 07/09/16 0501 07/10/16 0439  NA 144 142  K 4.0 3.5  CL 98* 87*  CO2 41* 49*  GLUCOSE 96 105*  BUN 11 11  CREATININE 0.66 0.80  CALCIUM 8.4* 8.2*  MG 1.5* 1.7  PHOS 2.6  --      Recent Labs Lab 07/06/16 0554 07/06/16 1559 07/06/16 2013 07/06/16 2357 07/07/16 0758  07/08/16 1113  GLUCAP 84 109* 114* 101* 92 70    Recent Labs Lab 07/04/16 1155 07/10/16 0356  PHART 7.15* 7.37  PCO2ART 77* 94*  PO2ART 58* 79*   No results for input(s): AST, ALT, ALKPHOS, BILITOT, ALBUMIN in the last 168 hours.  Cardiac Enzymes No results for input(s): TROPONINI in the last 168 hours.  RADIOLOGY:  Dg Chest 1 View  Result Date: 07/10/2016 CLINICAL DATA:  Shortness of breath. EXAM: CHEST 1 VIEW COMPARISON:  07/09/2016. FINDINGS: Stable cardiomegaly. Persistent multifocal pulmonary infiltrates particularly the right upper lung. Atelectatic changes right upper lung. Persistent right apical pleural thickening. Small right pleural effusion. Interim improvement of left pleural effusion. Prior cervical spine fusion. Left shoulder replacement. Degenerative changes right shoulder. IMPRESSION: 1. Persistent multifocal pulmonary infiltrates particularly in the right upper lung. Persistent atelectatic changes right upper lung. Persistent right apical pleural thickening. 2. Small right pleural effusion. Interim improvement of left pleural effusion . Electronically Signed   By: Marcello Moores  Register   On: 07/10/2016 06:54   Dg Chest Port 1 View  Result Date: 07/09/2016 CLINICAL DATA:  Respiratory failure. EXAM: PORTABLE CHEST 1 VIEW COMPARISON:  07/07/2016 FINDINGS: Left shoulder arthroplasty and lower cervical spine fixation. Mildly degraded exam due to AP portable technique and patient body habitus. New or increased right and persistent small left pleural effusion. No pneumothorax. Mild interstitial edema persists. Worsened right upper lobe airspace disease. Increased bibasilar airspace opacities. IMPRESSION: Overall worsened aeration, with increased interstitial edema and multifocal airspace opacities. Persistent left and new or increased layering right pleural effusion. Electronically Signed   By: Abigail Miyamoto M.D.   On: 07/09/2016 07:28        --Marda Stalker, MD.  ICU Pager:  [phone] [facility] Office Number: [phone]   07/10/2016

## 2016-07-11 DIAGNOSIS — Z7189 Other specified counseling: Secondary | ICD-10-CM

## 2016-07-11 DIAGNOSIS — Z515 Encounter for palliative care: Secondary | ICD-10-CM

## 2016-07-11 DIAGNOSIS — J189 Pneumonia, unspecified organism: Secondary | ICD-10-CM

## 2016-07-11 LAB — MAGNESIUM: Magnesium: 2.1 mg/dL (ref 1.7–2.4)

## 2016-07-11 LAB — BASIC METABOLIC PANEL
Anion gap: 7 (ref 5–15)
BUN: 21 mg/dL — AB (ref 6–20)
CALCIUM: 7.9 mg/dL — AB (ref 8.9–10.3)
CO2: 50 mmol/L — AB (ref 22–32)
CREATININE: 0.92 mg/dL (ref 0.44–1.00)
Chloride: 83 mmol/L — ABNORMAL LOW (ref 101–111)
GFR calc Af Amer: >60 mL/min (ref >60)
GFR calc non Af Amer: >60 mL/min (ref >60)
GLUCOSE: 153 mg/dL — AB (ref 65–99)
Potassium: 4.1 mmol/L (ref 3.5–5.1)
Sodium: 140 mmol/L (ref 135–145)

## 2016-07-11 LAB — PHOSPHORUS: Phosphorus: 4.6 mg/dL (ref 2.5–4.6)

## 2016-07-11 MED ORDER — MENTHOL 3 MG MT LOZG
1.0000 | LOZENGE | OROMUCOSAL | Status: DC | PRN
  Administered 2016-07-11: 3 mg via ORAL
  Filled 2016-07-11: qty 9

## 2016-07-11 NOTE — Consult Note (Signed)
Consultation Note Date: 07/11/2016   Patient Name: Hannah Powell  DOB: [birth year]  MRN: [id number]  Age / Sex: 71 y.o., female  PCP: Adin Hector, MD Referring Physician: Fritzi Mandes, MD  Reason for Consultation: Establishing goals of care  HPI/Patient Profile: 71 y.o. female  with past medical history of COPD on home oxygen, sleep apnea with CPAP HS, restless leg, hypertension, hypothyroidism, diastolic CHF, arthritis, and anxiety admitted on 07/02/2016 with respiratory distress and altered mental status. In ED, was in respiratory distress and placed on BiPAP. Found to have large right-sided pneumonia. Patient has acute on chronic hypoxic and hypercapnic respiratory failure complicated by pneumococcal pneumonia and severe COPD. Palliative medicine consultation for goals of care.   Clinical Assessment and Goals of Care: I have reviewed medical records, discussed with care team, and met with patient and son, Hannah Powell, at bedside to discuss diagnosis,  prognosis, GOC, EOL wishes, disposition and options. Husband is sick today and unable to visit the hospital.   Introduced Palliative Medicine as specialized medical care for people living with serious illness. It focuses on providing relief from the symptoms and stress of a serious illness. The goal is to improve quality of life for both the patient and the family.  We discussed a brief life review of the patient. She has been married to husband for 49 years. Two children and four grandchildren. Daughter lives nearby, son is in [place]. Patient is a retired Marine scientist. She has had COPD for about 5 years and been on home oxygen for about 3 years. Prior to hospitalization, patient was ambulating with crutches, able to perform ADL's, and with good appetite. Son speaks of her having exacerbations associated with traveling. "This often happens after she travels."    Discussed current diagnoses and interventions. Educated on the trajectory of COPD. Patient and son understand that COPD is a chronic, progressive, and irreversible disease. Patient tells me she "hopes to get off oxygen at home." Explained my concern that she will likely not be able to eliminate oxygen and that she is needing higher amounts of oxygen (high flow and BiPAP) to function. Son understands she will need BiPAP at home--if able to get home. We talked about her being at a new baseline and there often becomes a point where aggressive interventions are not effective as disease progresses--and the focus becoming comfort and symptom management.   Advanced directives, concepts specific to code status, and artifical feeding and hydration were considered and discussed. Patient/son confirms DNR/DNI.   Patient speaks of "doing what I want" and "making the calls on the BiPAP." Son and I reiterated to her that she will need the BiPAP to survive if this is what she wants for herself. Also, that she is not at a place where she can even be discharged to SNF for rehab. She may need a LTACH.   Patient is a spiritual individual of [place]. Strong faith community praying that she improves to the point of return home. Asked the patient her fears. She  tells me "I am afraid I won't go to heaven." Then states "that I won't survive." Becomes tearful during the conversation and changes the subject. Son encouraged she talk about her fears with their pastor.   At this point, patient/family will continue current plan of care understanding she may need LTACH. They are not ready for a transition to comfort. I encouraged the patient consider her wishes if she should continue to decline and discuss with husband and family.   Questions and concerns were addressed.  Hard Choices booklet left for review.     SUMMARY OF RECOMMENDATIONS    DNR/DNI  Continue current plan of care--patient/family not ready for comfort  measures only. Remain hopeful for improvement even if this means transfer to [facility].   Educated on trajectory of COPD.  Encouraged the patient and family continue to discuss [place] and EOL wishes if she should continue to decline.   PMT will continue to support patient/family through hospitalization.   Code Status/Advance Care Planning:  DNR   Symptom Management:   Per attending  Palliative Prophylaxis:   Aspiration, Delirium Protocol, Oral Care and Turn Reposition  Additional Recommendations (Limitations, Scope, Preferences):  Full Scope Treatment-except DNR/DNI  Psycho-social/Spiritual:   Desire for further Chaplaincy support: yes  Additional Recommendations: Caregiving  Support/Resources, Compassionate Wean Education and Education on Hospice  Prognosis:   Unable to determine  Discharge Planning: To Be Determined      Primary Diagnoses: Present on Admission: . Acute respiratory failure with hypoxia ([place])   I have reviewed the medical record, interviewed the patient and family, and examined the patient. The following aspects are pertinent.  Past Medical History:  Diagnosis Date  . Anxiety   . Arthritis   . CHF (congestive heart failure) ([place])   . COPD (chronic obstructive pulmonary disease) ([place])   . Hypertension    dr Otho Najjar     . Hypothyroidism   . RLS (restless legs syndrome)   . Shortness of breath   . Sleep apnea    cpap      >5 yrs   Social History   Social History  . Marital status: Married    Spouse name: N/A  . Number of children: N/A  . Years of education: N/A   Social History Main Topics  . Smoking status: Former Smoker    Packs/day: 1.00    Years: 10.00    Quit date: 03/20/1975  . Smokeless tobacco: Never Used  . Alcohol use Yes     Comment: occ  . Drug use: No  . Sexual activity: Not Asked   Other Topics Concern  . None   Social History Narrative  . None   Family History  Problem Relation Age of Onset  . Hypertension  Mother   . Diabetes Mother   . Heart failure Father   . Breast cancer Neg Hx    Scheduled Meds: . aspirin EC  81 mg Oral Daily  . benzonatate  200 mg Oral TID  . budesonide (PULMICORT) nebulizer solution  0.25 mg Nebulization Q6H  . chlorhexidine  15 mL Mouth Rinse BID  . chlorpheniramine-HYDROcodone  5 mL Oral Q12H  . enoxaparin (LOVENOX) injection  40 mg Subcutaneous Q12H  . furosemide  20 mg Intravenous Daily  . ipratropium-albuterol  3 mL Nebulization Q6H  . levothyroxine  200 mcg Oral QAC breakfast  . losartan  25 mg Oral Daily  . metolazone  5 mg Oral Daily  . pantoprazole (PROTONIX) IV  40 mg Intravenous  Q24H  . pramipexole  4 mg Oral QHS  . traZODone  100 mg Oral QHS   Continuous Infusions: . ampicillin-sulbactam (UNASYN) IV Stopped (07/11/16 2229)  . dextrose 5 % with KCl 20 mEq / L 20 mEq (07/11/16 0600)   PRN Meds:.acetaminophen, albuterol, ALPRAZolam, guaiFENesin, ibuprofen, morphine injection, ondansetron (ZOFRAN) IV Medications Prior to Admission:  Prior to Admission medications   Medication Sig Start Date End Date Taking? Authorizing Provider  aspirin EC 81 MG tablet Take 81 mg by mouth daily.   Yes Historical Provider, MD  B Complex-C (B-COMPLEX WITH VITAMIN C) tablet Take 1 tablet by mouth daily.   Yes Historical Provider, MD  Calcium Carbonate-Vitamin D (CALCIUM-VITAMIN D) 500-200 MG-UNIT per tablet Take 1 tablet by mouth daily.   Yes Historical Provider, MD  chlorpheniramine-HYDROcodone (TUSSIONEX PENNKINETIC ER) 10-8 MG/5ML SUER Take 5 mLs by mouth 2 (two) times daily. Patient taking differently: Take 5 mLs by mouth 2 (two) times daily as needed.  07/21/15  Yes Theodoro Grist, MD  doxycycline (VIBRA-TABS) 100 MG tablet Take 1 tablet (100 mg total) by mouth 2 (two) times daily. 03/02/16  Yes Wilhelmina Mcardle, MD  fluticasone furoate-vilanterol (BREO ELLIPTA) 100-25 MCG/INH AEPB Inhale 1 puff into the lungs daily. 02/22/16  Yes Flora Lipps, MD  furosemide (LASIX) 40  MG tablet Take 1 tablet (40 mg total) by mouth daily. 07/21/15  Yes Theodoro Grist, MD  guaiFENesin (MUCINEX) 600 MG 12 hr tablet Take 1 tablet (600 mg total) by mouth 2 (two) times daily. 07/21/15  Yes Theodoro Grist, MD  guaiFENesin-codeine 100-10 MG/5ML syrup Take 10 mLs by mouth every 4 (four) hours as needed for cough. 07/21/15  Yes Theodoro Grist, MD  ipratropium-albuterol (DUONEB) 0.5-2.5 (3) MG/3ML SOLN Take 3 mLs by nebulization every 4 (four) hours. Patient taking differently: Take 3 mLs by nebulization every 4 (four) hours as needed.  07/21/15  Yes Theodoro Grist, MD  levothyroxine (SYNTHROID, LEVOTHROID) 200 MCG tablet Take 200 mcg by mouth daily before breakfast.   Yes Historical Provider, MD  losartan (COZAAR) 25 MG tablet Take 25 mg by mouth daily.   Yes Historical Provider, MD  metolazone (ZAROXOLYN) 5 MG tablet Take 5 mg by mouth daily. 01/20/16 01/19/17 Yes Historical Provider, MD  nitrofurantoin, macrocrystal-monohydrate, (MACROBID) 100 MG capsule Take 1 capsule (100 mg total) by mouth 2 (two) times daily. 06/30/16  Yes Frederich Cha, MD  Omega-3 Fatty Acids (OMEGA 3 PO) Take 1 capsule by mouth daily.   Yes Historical Provider, MD  phenazopyridine (PYRIDIUM) 200 MG tablet Take 1 tablet (200 mg total) by mouth 3 (three) times daily as needed for pain. 06/30/16  Yes Frederich Cha, MD  pramipexole (MIRAPEX) 1 MG tablet Take 4 mg by mouth at bedtime.    Yes Historical Provider, MD  roflumilast (DALIRESP) 500 MCG TABS tablet Take 1 tablet (500 mcg total) by mouth daily. 02/16/16  Yes Flora Lipps, MD  zolpidem (AMBIEN) 10 MG tablet Take 10 mg by mouth at bedtime.  12/30/15  Yes Historical Provider, MD   Allergies  Allergen Reactions  . Lyrica [Pregabalin] Other (See Comments)    Reaction: made Restless Leg Syndrome worse    Review of Systems  Unable to perform ROS: Acuity of condition   Physical Exam  Constitutional: She is easily aroused. She appears ill.  HENT:  Head: Normocephalic and  atraumatic.  Cardiovascular: Regular rhythm.   Pulmonary/Chest: No tachypnea. No respiratory distress. She has decreased breath sounds.  Abdominal: Normal appearance and bowel  sounds are normal.  Neurological: She is alert and easily aroused.  Skin: Skin is warm and dry. There is pallor.  Psychiatric: She has a normal mood and affect. Her speech is normal and behavior is normal.  Nursing note and vitals reviewed.  Vital Signs: BP (!) 90/52   Pulse 85   Temp 98 F (36.7 C) (Axillary)   Resp 17   Ht _0  (1.651 m)   Wt 119.4 kg (263 lb 3.7 oz)   SpO2 91%   BMI 43.80 kg/m  Pain Assessment: No/denies pain POSS *See Group Information*: S-Acceptable,Sleep, easy to arouse Pain Score: 0-No pain   SpO2: SpO2: 91 % O2 Device:SpO2: 91 % O2 Flow Rate: .O2 Flow Rate (L/min): 50 L/min  IO: Intake/output summary:   Intake/Output Summary (Last 24 hours) at 07/11/16 0957 Last data filed at 07/11/16 0900  Gross per 24 hour  Intake              800 ml  Output             1100 ml  Net             -300 ml    LBM: Last BM Date: 07/06/16 Baseline Weight: Weight: 113.4 kg (250 lb) Most recent weight: Weight: 119.4 kg (263 lb 3.7 oz)     Palliative Assessment/Data: PPS 30%   Flowsheet Rows     Most Recent Value  Intake Tab  Referral Department  Critical care  Unit at Time of Referral  ICU  Palliative Care Primary Diagnosis  Pulmonary  Palliative Care Type  New Palliative care  Reason for referral  Clarify Goals of Care  Date first seen by Palliative Care  07/11/16  Clinical Assessment  Palliative Performance Scale Score  30%  Psychosocial & Spiritual Assessment  Palliative Care Outcomes  Patient/Family meeting held?  Yes  Who was at the meeting?  patient and son  Palliative Care Outcomes  Clarified goals of care, Provided psychosocial or spiritual support, Linked to palliative care logitudinal support, ACP counseling assistance, Counseled regarding hospice      Time In:  1040 Time Out: 1150 Time Total: 38mn Greater than 50%  of this time was spent counseling and coordinating care related to the above assessment and plan.  Signed by:  MIhor Dow FNP-C Palliative Medicine Team  Phone: [phone]: [phone]  Please contact Palliative Medicine Team phone at [phone] questions and concerns.  For individual provider: See AShea Evans

## 2016-07-11 NOTE — Progress Notes (Signed)
Pharmacy Antibiotic Note/ Electrolyte Monitoring  Hannah Powell is a 71 y.o. female admitted on 07/02/2016 with  pneumonia.  Pharmacy has been consulted for Unasyn dosing as well as for electrolyte monitoring and replacement.   Plan: 1. Continue Unasyn 3 g iv q 6 hours. Plan is for 10 day course of antibiotics per ID.   2. No replacement needed for electrolytes. Will f/u AM labs.   Height: 5\' 5"  (165.1 cm) Weight: 263 lb 3.7 oz (119.4 kg) IBW/kg (Calculated) : 57  Temp (24hrs), Avg:97.6 F (36.4 C), Min:96.7 F (35.9 C), Max:98 F (36.7 C)   Recent Labs Lab 07/05/16 0345 07/07/16 0447 07/09/16 0501 07/10/16 0439 07/11/16 0415  WBC 13.4* 7.5 7.6 7.9  --   CREATININE 1.10* 0.75 0.66 0.80 0.92    Estimated Creatinine Clearance: 74.7 mL/min (by C-G formula based on SCr of 0.92 mg/dL).    BMP Latest Ref Rng & Units 07/11/2016 07/10/2016 07/09/2016  Glucose 65 - 99 mg/dL 153(H) 105(H) 96  BUN 6 - 20 mg/dL 21(H) 11 11  Creatinine 0.44 - 1.00 mg/dL 0.92 0.80 0.66  Sodium 135 - 145 mmol/L 140 142 144  Potassium 3.5 - 5.1 mmol/L 4.1 3.5 4.0  Chloride 101 - 111 mmol/L 83(L) 87(L) 98(L)  CO2 22 - 32 mmol/L 50(H) 49(H) 41(H)  Calcium 8.9 - 10.3 mg/dL 7.9(L) 8.2(L) 8.4(L)   Magnesium (mg/dL)  Date Value  07/11/2016 2.1  01/04/2013 2.0   Phosphorus (mg/dL)  Date Value  07/11/2016 4.6   Albumin (g/dL)  Date Value  07/02/2016 3.1 (L)  06/30/2014 3.8     Allergies  Allergen Reactions  . Lyrica [Pregabalin] Other (See Comments)    Reaction: made Restless Leg Syndrome worse     Antimicrobials this admission: 4/20 ceftriaxone >> 4/20 4/21 levofloxacin >>4/21 4/22 Unasyn >>  Dose adjustments this admission:  Microbiology results: 4/17 BCx: STAPHYLOCOCCUS CAPITIS 4/18 BX: NG x 3 days  4/17 Sputum: STREPTOCOCCUS PNEUMONIAE 4/17 MRSA PCR: negative  4/19 BCx: negative  Thank you for allowing pharmacy to be a part of this patient's care.  Napoleon Form, PharmD,  BCPS Clinical Pharmacist 07/11/2016 2:28 PM

## 2016-07-11 NOTE — Progress Notes (Signed)
Discussed with Dr. Ashby Dawes during rounds that patient's blood pressure has been low this morning with MAP of 64 at highest and that patient has lasix, cozarr and metalozone due.  Patient arouses to voice and is oriented to self and place but has confused conversation at times.  Dr. Ashby Dawes gave order to hold lasix and metalozone if systolic BP is less than 90 and that cozarr can be given.

## 2016-07-11 NOTE — Progress Notes (Signed)
RN made Dr. Ashby Dawes aware that patient continues to cough after taking tessalon pearls and tussin ex this morning. RN asked about cough drops.  MD gave order for Cepacol lozenges.

## 2016-07-11 NOTE — Progress Notes (Signed)
[place] [facility] [place] NAME: Hannah Powell    MR#:  [id number]  DATE OF BIRTH:  December 31, 1945  SUBJECTIVE:  On BIPAP at present  REVIEW OF SYSTEMS:   Review of Systems  Constitutional: Negative for chills, fever and weight loss.  HENT: Negative for ear discharge, ear pain and nosebleeds.   Eyes: Negative for blurred vision, pain and discharge.  Respiratory: Positive for shortness of breath. Negative for sputum production, wheezing and stridor.   Cardiovascular: Negative for chest pain, palpitations, orthopnea and PND.  Gastrointestinal: Negative for abdominal pain, diarrhea, nausea and vomiting.  Genitourinary: Negative for frequency and urgency.  Musculoskeletal: Negative for back pain and joint pain.  Neurological: Positive for weakness. Negative for sensory change, speech change and focal weakness.  Psychiatric/Behavioral: Negative for depression and hallucinations. The patient is not nervous/anxious.    Tolerating Diet:yes Tolerating PT: pending  DRUG ALLERGIES:   Allergies  Allergen Reactions  . Lyrica [Pregabalin] Other (See Comments)    Reaction: made Restless Leg Syndrome worse     VITALS:  Blood pressure (!) 89/51, pulse 82, temperature 97.7 F (36.5 C), temperature source Axillary, resp. rate (!) 21, height 5\' 5"  (1.651 m), weight 119.4 kg (263 lb 3.7 oz), SpO2 93 %.  PHYSICAL EXAMINATION:   Physical Exam  GENERAL:  71 y.o.-year-old patient lying in the bed with moderate  distress. Obese EYES: Pupils equal, round, reactive to light and accommodation. No scleral icterus. Extraocular muscles intact.  HEENT: Head atraumatic, normocephalic. Oropharynx and nasopharynx clear. BIPAP+ cyanotic lips NECK:  Supple, no jugular venous distention. No thyroid enlargement, no tenderness.  LUNGS:distant breath sounds bilaterally, no wheezing,++ rales,no rhonchi. Shallow breathing CARDIOVASCULAR: S1, S2 normal. No murmurs, rubs, or  gallops.  ABDOMEN: Soft, nontender, nondistended. Bowel sounds present. No organomegaly or mass.  EXTREMITIES: No cyanosis, clubbing or edema b/l.    NEUROLOGIC: Cranial nerves II through XII are intact. No focal Motor or sensory deficits b/l.   PSYCHIATRIC:  patient is alert and oriented x 3. But anxious  SKIN: No obvious rash, lesion, or ulcer.   LABORATORY PANEL:  CBC  Recent Labs Lab 07/10/16 0439  WBC 7.9  HGB 13.3  HCT 40.8  PLT 105*    Chemistries   Recent Labs Lab 07/11/16 0415  NA 140  K 4.1  CL 83*  CO2 50*  GLUCOSE 153*  BUN 21*  CREATININE 0.92  CALCIUM 7.9*  MG 2.1   Cardiac Enzymes No results for input(s): TROPONINI in the last 168 hours. RADIOLOGY:  Dg Chest 1 View  Result Date: 07/10/2016 CLINICAL DATA:  Shortness of breath. EXAM: CHEST 1 VIEW COMPARISON:  07/09/2016. FINDINGS: Stable cardiomegaly. Persistent multifocal pulmonary infiltrates particularly the right upper lung. Atelectatic changes right upper lung. Persistent right apical pleural thickening. Small right pleural effusion. Interim improvement of left pleural effusion. Prior cervical spine fusion. Left shoulder replacement. Degenerative changes right shoulder. IMPRESSION: 1. Persistent multifocal pulmonary infiltrates particularly in the right upper lung. Persistent atelectatic changes right upper lung. Persistent right apical pleural thickening. 2. Small right pleural effusion. Interim improvement of left pleural effusion . Electronically Signed   By: Marcello Moores  Register   On: 07/10/2016 06:54   ASSESSMENT AND PLAN:  [name] a 71 y.o.femalepresented with respiratory distress. She stated on Thursday she started feeling bad. She needed to increase her oxygen from 2 L to 3 L. On Saturday she had shaking chills and fever.   1. Acute on chronic hypoxic/hypercarbic  respiratory failure with hypoxia and acidosis.  -Appears combination of severe COPD and CHF acute diastolic (Echo in the past EF  65%) - Patient is back on BiPAP due to change in mentation and CO2 narcosis -IV lasix ---good diureses (-) by 7 liters  2. Clinical sepsis with Right-sided pneumonia, leukocytosis and tachypnea. -sputum culture strept pneumonia resistant to levofloxacin--per ID use oral penicillin. -will give IV unasyn now that she is will require BIPAP  3. Acute kidney injury due to ATN from pneumonia and sepsis. -creat improving Baseline creat 0.75 in 01/2016 Came in with 2.53--- 2.08--1.10--0.75  4. Acute on chronic COPD on chronic oxygen. -management as above  5. Sleep apnea on CPAP at night    6. Essential hypertension blood pressure on the lower side. IV fluid hydration  7. Hypothyroidism unspecified on levothyroxine  8. Restless leg syndrome on Mirapex  Spoke with the family member the room. Discharge planning possible LTAC.  Case discussed with Care Management/Social Worker. Management plans discussed with the patient, family and they are in agreement.  CODE STATUS: DNR  DVT Prophylaxis: lovenox  TOTAL TIME TAKING CARE OF THIS PATIENT: 30 minutes.  >50% time spent on counselling and coordination of care   Note: This dictation was prepared with Dragon dictation along with smaller phrase technology. Any transcriptional errors that result from this process are unintentional.  Ellis Koffler M.D on 07/11/2016 at 3:06 PM  Between 7am to 6pm - Pager - [phone]  After 6pm go to [email or web address] - password [email or web address] [facility]  Office  [phone]  CC: Primary care physician; Adin Hector, MD

## 2016-07-11 NOTE — Care Management (Addendum)
RNCM notified by Jinny Blossom with Palliative team that Raynie Steinhaus son [phone] requests call from me.  I have explained everything to [place] as I did Mr. Whitt (husband). They will talk and decide on LTAC or not. Trilogy may be delivered tomorrow.

## 2016-07-11 NOTE — Progress Notes (Signed)
SLP Cancellation Note: Chart reviewed. Pt Still on Bipap. Will hold off on BSE until respiratory status has improved.  Patient Details Name: Hannah Powell MRN: [id number] DOB: 24-Mar-1945   Cancelled treatment:           Lucila Maine 07/11/2016, 11:16 AM

## 2016-07-11 NOTE — Care Management (Signed)
Spoke with patient's husband this morning regarding trial with Triliogy while here at hospital- he agrees. He also agrees for LTAC screening. I have notified [employer] worker, community pending husband/physician decision.

## 2016-07-11 NOTE — Progress Notes (Addendum)
[facility] Progess Note    SYNOPSIS   71 y.o. F former smoker pt of pulmonologist Dr. Mortimer Fries admitted 04/16 with acute on chronic hypercapnic hypoxic respiratory failure secondary to AECOPD and right sided pneumonia requiring continuous Bipap. Making minimal progress.   ASSESSMENT/PLAN   ADDENDUM:  Spoke with patient's son, Darrick Meigs, explained poor prognosis, she has been on Bipap for several days, and we would have expected improvement, however she continues to decline and does not appear to be improving. She is starting to have tissue breakdown from bipap mask, and has been unable to be without the bipap for even short periods. Recommend that we should change goals of care to comfort, if continued care is desired, then we would have to transfer to [facility] and consider intubation as I do not think she will tolerate Bipap much longer.  Subsequently, informed RN that he wants his mother to be transferred. Then called [place] office wanting his mother to be seen by Dr. Mortimer Fries right away, he was apparently lead to believe that he could approach the front desk and simply ask for Dr. Mortimer Fries, and he expected me to call Dr. Mortimer Fries to come in and see her.  I informed him that I am covering for Dr. Mortimer Fries at this time, he would not be in. However a different doctor is taking over the service tomorrow and Dr. Mortimer Fries would be available at that time, to provide his opinion at that time. He stated that he needs a second opinion today, and waiting until tomorrow is unacceptable.  I  offered a second opinion from another doctor, which he agreed to. I consulted Dr. Raul Del for a second opinion who will be seeing the patient later today.   PULMONARY A:Acute hypoxic respiratory failure with pneumococcal pneumonia, complicating severe COPD. Pt has acute on chronic hypoxic and hypercapnic respiratory failure, she has had multiple exacerbations requiring hospitalization, she remains at high risk of  progression of disease, intubation, and readmission for Copd exacerbation. She would benefit from non-invasive ventilatory therapy long term.  -Images personally reviewed; chest x-ray 07/11/16; continued right upper lobe infiltrate, which may be consistent with pneumonia. -ABG, 7.37/94/79/54.3. Consistent with compensated chronic hypercapnic respiratory failure. Likely secondary to severe COPD, as well as some element of obesity hypoventilation syndrome. -Currently on BiPAP at16/8 50%-70 this am. P: -Bipapd/w Education officer, museum re: obtaining home trilogy ventilator and possible transfer to [place].  -Avoid narcotics.  CARDIOVASCULAR A:essential hypertension , Currently blood pressure appears adequately controlled.  --Acute on Chronic volume overload. P: Continue diuresis.  Continue zaroxolyn.    RENAL A:-- good urine output 4/24.  INTAKE / OUTPUT:  Intake/Output Summary (Last 24 hours) at 07/11/16 0858 Last data filed at 07/11/16 0800  Gross per 24 hour  Intake              750 ml  Output             1100 ml  Net             -350 ml    GASTROINTESTINAL A:  --  HEMATOLOGIC A:  -  INFECTIOUS A:  -- P:    Micro/culture results UC 07/05/16; negative Sputum: 07/03/16; positive for strep pneumonia; levofloxacin, resistant MRSA screen 4/16; negative  Antibiotics: 4/20 ceftriaxone >> 4/20 4/21 levofloxacin >>4/21 4/22 Unasyn >>  ENDOCRINE A:  Glucose controlled.  P:   --  NEUROLOGIC A:  --   MAJOR EVENTS/TEST RESULTS:   Best Practices  DVT Prophylaxis:  enoxaparin.  GI Prophylaxis: famotidine.   --Daughter and husband updated at bedside.  ---------------------------------------   ----------------------------------------   Name: Hannah Powell MRN: [id number] DOB: 05-04-1945    ADMISSION DATE:  07/02/2016    SUBJECTIVE:   Pt currently on the bipap, can not provide history or review of systems.  Review of Systems:  --   VITAL SIGNS: Temp:  [96.7 F  (35.9 C)-98.6 F (37 C)] 98 F (36.7 C) (04/25 0816) Pulse Rate:  [82-112] 83 (04/25 0820) Resp:  [17-42] 22 (04/25 0820) BP: (79-148)/(53-128) 95/57 (04/25 0820) SpO2:  [87 %-99 %] 96 % (04/25 0820) FiO2 (%):  [70 %-100 %] 70 % (04/25 0820)     PHYSICAL EXAMINATION: Physical Examination:   VS: BP (!) 95/57   Pulse 83   Temp 98 F (36.7 C) (Axillary)   Resp (!) 22   Ht 5\' 5"  (1.651 m)   Wt 263 lb 3.7 oz (119.4 kg)   SpO2 96%   BMI 43.80 kg/m   General Appearance: No distress  Neuro:without focal findings, mental status lethargic HEENT: PERRLA, EOM intact. Pulmonary: decreased air entry bilaterally.  CardiovascularNormal S1,S2.  No m/r/g.   Abdomen: Benign, Soft, non-tender. Renal:  No costovertebral tenderness  GU:  Not performed at this time. Endocrine: No evident thyromegaly. Skin:   warm, no rashes, no ecchymosis  Extremities: normal, no cyanosis, clubbing.    LABORATORY PANEL:   CBC  Recent Labs Lab 07/10/16 0439  WBC 7.9  HGB 13.3  HCT 40.8  PLT 105*    Chemistries   Recent Labs Lab 07/11/16 0415  NA 140  K 4.1  CL 83*  CO2 50*  GLUCOSE 153*  BUN 21*  CREATININE 0.92  CALCIUM 7.9*  MG 2.1  PHOS 4.6     Recent Labs Lab 07/06/16 0554 07/06/16 1559 07/06/16 2013 07/06/16 2357 07/07/16 0758 07/08/16 1113  GLUCAP 84 109* 114* 101* 92 70    Recent Labs Lab 07/04/16 1155 07/10/16 0356  PHART 7.15* 7.37  PCO2ART 77* 94*  PO2ART 58* 79*   No results for input(s): AST, ALT, ALKPHOS, BILITOT, ALBUMIN in the last 168 hours.  Cardiac Enzymes No results for input(s): TROPONINI in the last 168 hours.  RADIOLOGY:  Dg Chest 1 View  Result Date: 07/10/2016 CLINICAL DATA:  Shortness of breath. EXAM: CHEST 1 VIEW COMPARISON:  07/09/2016. FINDINGS: Stable cardiomegaly. Persistent multifocal pulmonary infiltrates particularly the right upper lung. Atelectatic changes right upper lung. Persistent right apical pleural thickening. Small  right pleural effusion. Interim improvement of left pleural effusion. Prior cervical spine fusion. Left shoulder replacement. Degenerative changes right shoulder. IMPRESSION: 1. Persistent multifocal pulmonary infiltrates particularly in the right upper lung. Persistent atelectatic changes right upper lung. Persistent right apical pleural thickening. 2. Small right pleural effusion. Interim improvement of left pleural effusion . Electronically Signed   By: Marcello Moores  Register   On: 07/10/2016 06:54        --Marda Stalker, MD.  ICU Pager: [phone] [facility] Office Number: [phone]   07/11/2016   Critical Care Attestation.  I have personally obtained a history, examined the patient, evaluated laboratory and imaging results, formulated the assessment and plan and placed orders. The Patient requires high complexity decision making for assessment and support, frequent evaluation and titration of therapies, application of advanced monitoring technologies and extensive interpretation of multiple databases. The patient has critical illness that could lead imminently to failure of 1 or more organ systems and requires the  highest level of physician preparedness to intervene.  Critical Care Time devoted to patient care services described in this note is 34 minutes and is exclusive of time spent in procedures.

## 2016-07-11 NOTE — Progress Notes (Signed)
Patient asking for something to eat therefore RN asked Hinton Dyer, NP about diet.  NP gave order for diet.  Patient alert to self and place with confused conversation at times.  Denies pain.  No respiratory distress at rest.  Patient complaining that high flow oxygen is too much air flow to her nose therefore trying nasal cannula at this time.  Patient on bipap for a good part of shift and tolerated well.  Daughter and son at bedside.

## 2016-07-11 NOTE — Progress Notes (Signed)
PT Cancellation Note  Patient Details Name: Hannah Powell MRN: [id number] DOB: March 05, 1946   Cancelled Treatment:    Reason Eval/Treat Not Completed: Medical issues which prohibited therapy.  Per notes, pt on BiPAP today.  Will hold PT at this time and re-attempt PT treatment at a later date/time as medically appropriate.  Leitha Bleak, PT 07/11/16, 5:18 PM [phone]

## 2016-07-12 ENCOUNTER — Telehealth: Payer: Self-pay | Admitting: Internal Medicine

## 2016-07-12 ENCOUNTER — Inpatient Hospital Stay: Payer: Medicare HMO

## 2016-07-12 DIAGNOSIS — J9602 Acute respiratory failure with hypercapnia: Secondary | ICD-10-CM

## 2016-07-12 LAB — BASIC METABOLIC PANEL
ANION GAP: 7 (ref 5–15)
BUN: 31 mg/dL — ABNORMAL HIGH (ref 6–20)
CHLORIDE: 82 mmol/L — AB (ref 101–111)
CO2: 50 mmol/L — AB (ref 22–32)
Calcium: 8.2 mg/dL — ABNORMAL LOW (ref 8.9–10.3)
Creatinine, Ser: 0.97 mg/dL (ref 0.44–1.00)
GFR calc non Af Amer: 58 mL/min — ABNORMAL LOW (ref >60)
Glucose, Bld: 93 mg/dL (ref 65–99)
Potassium: 4.1 mmol/L (ref 3.5–5.1)
Sodium: 139 mmol/L (ref 135–145)

## 2016-07-12 LAB — PHOSPHORUS
PHOSPHORUS: 3.3 mg/dL (ref 2.5–4.6)
Phosphorus: 2.7 mg/dL (ref 2.5–4.6)

## 2016-07-12 LAB — CBC
HEMATOCRIT: 39.2 % (ref 35.0–47.0)
HEMOGLOBIN: 12.5 g/dL (ref 12.0–16.0)
MCH: 30.3 pg (ref 26.0–34.0)
MCHC: 31.9 g/dL — AB (ref 32.0–36.0)
MCV: 95.1 fL (ref 80.0–100.0)
Platelets: 131 10*3/uL — ABNORMAL LOW (ref 150–440)
RBC: 4.12 MIL/uL (ref 3.80–5.20)
RDW: 16.2 % — ABNORMAL HIGH (ref 11.5–14.5)
WBC: 8.6 10*3/uL (ref 3.6–11.0)

## 2016-07-12 LAB — MAGNESIUM
MAGNESIUM: 1.7 mg/dL (ref 1.7–2.4)
Magnesium: 2.1 mg/dL (ref 1.7–2.4)

## 2016-07-12 LAB — GLUCOSE, CAPILLARY
GLUCOSE-CAPILLARY: 111 mg/dL — AB (ref 65–99)
Glucose-Capillary: 103 mg/dL — ABNORMAL HIGH (ref 65–99)

## 2016-07-12 MED ORDER — FUROSEMIDE 10 MG/ML IJ SOLN: 40.0000 mg | Freq: Every day | INTRAMUSCULAR | Status: DC

## 2016-07-12 MED ORDER — FUROSEMIDE 10 MG/ML IJ SOLN
20.0000 mg | Freq: Once | INTRAMUSCULAR | Status: AC
  Administered 2016-07-12: 20 mg via INTRAVENOUS
  Filled 2016-07-12: qty 2

## 2016-07-12 MED ORDER — FENTANYL 2500MCG IN NS 250ML (10MCG/ML) PREMIX INFUSION
0.0000 ug/h | INTRAVENOUS | Status: DC
  Administered 2016-07-12: 300 ug/h via INTRAVENOUS
  Administered 2016-07-12: 250 ug/h via INTRAVENOUS
  Administered 2016-07-13 (×2): 200 ug/h via INTRAVENOUS
  Administered 2016-07-14: 300 ug/h via INTRAVENOUS
  Administered 2016-07-14: 250 ug/h via INTRAVENOUS
  Administered 2016-07-15: 300 ug/h via INTRAVENOUS
  Administered 2016-07-15: 250 ug/h via INTRAVENOUS
  Administered 2016-07-15 – 2016-07-16 (×2): 350 ug/h via INTRAVENOUS
  Administered 2016-07-16: 400 ug/h via INTRAVENOUS
  Administered 2016-07-16: 350 ug/h via INTRAVENOUS
  Administered 2016-07-17: 50 ug/h via INTRAVENOUS
  Filled 2016-07-12 (×13): qty 250

## 2016-07-12 MED ORDER — CHLORHEXIDINE GLUCONATE 0.12% ORAL RINSE (MEDLINE KIT)
15.0000 mL | Freq: Two times a day (BID) | OROMUCOSAL | Status: DC
  Administered 2016-07-12 – 2016-07-18 (×11): 15 mL via OROMUCOSAL

## 2016-07-12 MED ORDER — FENTANYL BOLUS VIA INFUSION
25.0000 ug | INTRAVENOUS | Status: DC | PRN
  Administered 2016-07-13 – 2016-07-18 (×15): 25 ug via INTRAVENOUS
  Filled 2016-07-12: qty 25

## 2016-07-12 MED ORDER — FENTANYL CITRATE (PF) 100 MCG/2ML IJ SOLN: 50.0000 ug | Freq: Once | INTRAMUSCULAR | Status: DC

## 2016-07-12 MED ORDER — ORAL CARE MOUTH RINSE
15.0000 mL | Freq: Four times a day (QID) | OROMUCOSAL | Status: DC
  Administered 2016-07-12 – 2016-07-17 (×20): 15 mL via OROMUCOSAL

## 2016-07-12 MED ORDER — STERILE WATER FOR INJECTION IJ SOLN
INTRAMUSCULAR | Status: AC
  Administered 2016-07-12: 18:00:00
  Filled 2016-07-12: qty 10

## 2016-07-12 MED ORDER — PRO-STAT SUGAR FREE PO LIQD
30.0000 mL | Freq: Two times a day (BID) | ORAL | Status: DC
  Administered 2016-07-12 – 2016-07-13 (×3): 30 mL

## 2016-07-12 MED ORDER — FENTANYL CITRATE (PF) 100 MCG/2ML IJ SOLN
INTRAMUSCULAR | Status: AC
  Administered 2016-07-12: 100 ug
  Filled 2016-07-12: qty 4

## 2016-07-12 MED ORDER — MIDAZOLAM HCL 2 MG/2ML IJ SOLN
1.0000 mg | INTRAMUSCULAR | Status: AC | PRN
  Administered 2016-07-12 – 2016-07-13 (×3): 1 mg via INTRAVENOUS
  Filled 2016-07-12 (×3): qty 2

## 2016-07-12 MED ORDER — NOREPINEPHRINE 4 MG/250ML-% IV SOLN
INTRAVENOUS | Status: AC
  Filled 2016-07-12: qty 250

## 2016-07-12 MED ORDER — VECURONIUM BROMIDE 10 MG IV SOLR
INTRAVENOUS | Status: AC
  Administered 2016-07-12: 10 mg
  Filled 2016-07-12: qty 10

## 2016-07-12 MED ORDER — MIDAZOLAM HCL 2 MG/2ML IJ SOLN
INTRAMUSCULAR | Status: AC
  Administered 2016-07-12: 2 mg
  Filled 2016-07-12: qty 4

## 2016-07-12 MED ORDER — SENNOSIDES-DOCUSATE SODIUM 8.6-50 MG PO TABS
2.0000 | ORAL_TABLET | Freq: Two times a day (BID) | ORAL | Status: DC
  Administered 2016-07-12: 2 via ORAL
  Filled 2016-07-12: qty 2

## 2016-07-12 MED ORDER — VITAL HIGH PROTEIN PO LIQD
1000.0000 mL | ORAL | Status: DC
  Administered 2016-07-12 (×2)
  Administered 2016-07-12: 1000 mL

## 2016-07-12 MED ORDER — DEXTROSE 5 % IV SOLN
0.0000 ug/min | INTRAVENOUS | Status: DC
  Administered 2016-07-12: 8 ug/min via INTRAVENOUS
  Administered 2016-07-13: 17 ug/min via INTRAVENOUS
  Administered 2016-07-13: 20 ug/min via INTRAVENOUS
  Administered 2016-07-14: 21 ug/min via INTRAVENOUS
  Administered 2016-07-15: 31 ug/min via INTRAVENOUS
  Administered 2016-07-17: 3 ug/min via INTRAVENOUS
  Filled 2016-07-12 (×8): qty 16

## 2016-07-12 MED ORDER — POTASSIUM CHLORIDE 2 MEQ/ML IV SOLN
INTRAVENOUS | Status: DC
  Administered 2016-07-12: 12:00:00 via INTRAVENOUS
  Filled 2016-07-12: qty 990

## 2016-07-12 MED ORDER — MIDAZOLAM HCL 2 MG/2ML IJ SOLN
1.0000 mg | INTRAMUSCULAR | Status: DC | PRN
  Administered 2016-07-13 – 2016-07-14 (×3): 1 mg via INTRAVENOUS
  Filled 2016-07-12 (×3): qty 2

## 2016-07-12 MED ORDER — NOREPINEPHRINE 4 MG/250ML-% IV SOLN
0.0000 ug/min | INTRAVENOUS | Status: DC
  Administered 2016-07-12: 8 ug/min via INTRAVENOUS

## 2016-07-12 NOTE — Procedures (Signed)
Central Venous Catheter Insertion Procedure Note Anjeli Casad [id number] 07-Apr-1945  Procedure: Insertion of Central Venous Catheter Indications: Assessment of intravascular volume, Drug and/or fluid administration and Frequent blood sampling  Procedure Details Consent: Risks of procedure as well as the alternatives and risks of each were explained to the (patient/caregiver).  Consent for procedure obtained. Time Out: Verified patient identification, verified procedure, site/side was marked, verified correct patient position, special equipment/implants available, medications/allergies/relevent history reviewed, required imaging and test results available.  Performed  Maximum sterile technique was used including antiseptics, cap, gloves, gown, hand hygiene, mask and sheet. Skin prep: Chlorhexidine; local anesthetic administered A antimicrobial bonded/coated triple lumen catheter was placed in the left internal jugular vein using the Seldinger technique.  Evaluation Blood flow good Complications: No apparent complications Patient did tolerate procedure well. Chest X-ray ordered to verify placement.  CXR: pending.  Left internal jugular central line placed utilizing ultrasound no complications noted during or following procedure.  Marda Stalker, [place] Pager [phone] (please enter 7 digits) [place] Pager [phone] (please enter 7 digits)   Merton Border, MD PCCM service Mobile [phone] Pager [phone] 07/16/2016 4:47 PM

## 2016-07-12 NOTE — Telephone Encounter (Signed)
Jawanna Dykman pt son calling stating he is in hospital with patient. He called asked the nurses station to see if he could possibly get an opinion from patient primary pulmonary provider who is Dr Mortimer Fries, but when he went he states Dr Ashby Dawes was not happy about that and stated to him that he would have to wait until tomorrow when both providers switched out. He states he is very concerned for patient and that Dr Ashby Dawes told him he was not going to contact Dr Mortimer Fries for he was on in the hospital today  Would like someone to please help patient. He is worried for patient.

## 2016-07-12 NOTE — Progress Notes (Signed)
[place] at [place] NAME: Hannah Powell    MR#:  [id number]  DATE OF BIRTH:  1945-12-19  SUBJECTIVE:  On BIPAP at present Does not appear to look good. fatigued  REVIEW OF SYSTEMS:   Review of Systems  Constitutional: Negative for chills, fever and weight loss.  HENT: Negative for ear discharge, ear pain and nosebleeds.   Eyes: Negative for blurred vision, pain and discharge.  Respiratory: Positive for shortness of breath. Negative for sputum production, wheezing and stridor.   Cardiovascular: Negative for chest pain, palpitations, orthopnea and PND.  Gastrointestinal: Negative for abdominal pain, diarrhea, nausea and vomiting.  Genitourinary: Negative for frequency and urgency.  Musculoskeletal: Negative for back pain and joint pain.  Neurological: Positive for weakness. Negative for sensory change, speech change and focal weakness.  Psychiatric/Behavioral: Negative for depression and hallucinations. The patient is not nervous/anxious.    Tolerating Diet:yes Tolerating PT: pending  DRUG ALLERGIES:   Allergies  Allergen Reactions  . Lyrica [Pregabalin] Other (See Comments)    Reaction: made Restless Leg Syndrome worse     VITALS:  Blood pressure 127/66, pulse 98, temperature 97.8 F (36.6 C), resp. rate (!) 35, height 5\' 5"  (1.651 m), weight 119.4 kg (263 lb 3.7 oz), SpO2 90 %.  PHYSICAL EXAMINATION:   Physical Exam  GENERAL:  71 y.o.-year-old patient lying in the bed with moderate  distress. Obese EYES: Pupils equal, round, reactive to light and accommodation. No scleral icterus. Extraocular muscles intact.  HEENT: Head atraumatic, normocephalic. Oropharynx and nasopharynx clear. BIPAP+ cyanotic lips NECK:  Supple, no jugular venous distention. No thyroid enlargement, no tenderness.  LUNGS:distant breath sounds bilaterally, no wheezing,++ rales,no rhonchi. Shallow breathing CARDIOVASCULAR: S1, S2 normal. No murmurs,  rubs, or gallops.  ABDOMEN: Soft, nontender, nondistended. Bowel sounds present. No organomegaly or mass.  EXTREMITIES: No cyanosis, clubbing or edema b/l.    NEUROLOGIC: Cranial nerves II through XII are intact. No focal Motor or sensory deficits b/l.   PSYCHIATRIC:  patient is alert and oriented x 3. But anxious  SKIN: No obvious rash, lesion, or ulcer.   LABORATORY PANEL:  CBC  Recent Labs Lab 07/12/16 0534  WBC 8.6  HGB 12.5  HCT 39.2  PLT 131*    Chemistries   Recent Labs Lab 07/12/16 0534  NA 139  K 4.1  CL 82*  CO2 50*  GLUCOSE 93  BUN 31*  CREATININE 0.97  CALCIUM 8.2*  MG 2.1   Cardiac Enzymes No results for input(s): TROPONINI in the last 168 hours. RADIOLOGY:  No results found. ASSESSMENT AND PLAN:  [name] a 71 y.o.femalepresented with respiratory distress. She stated on Thursday she started feeling bad. She needed to increase her oxygen from 2 L to 3 L. On Saturday she had shaking chills and fever.   1. Acute on chronic hypoxic/hypercarbic respiratory failure with hypoxia and acidosis.  -Appears combination of severe COPD and CHF acute diastolic (Echo in the past EF 65%) - Patient is back on BiPAP due to change in mentation and CO2 narcosis -IV lasix ---good diureses (-) by 7 liters -family had discussion with Hannah Powell. They want pt to be intubated today (07/12/16)  2. Clinical sepsis with Right-sided pneumonia, leukocytosis and tachypnea. -sputum culture strept pneumonia resistant to levofloxacin--per ID use oral penicillin. -iv unasyn  3. Acute kidney injury due to ATN from pneumonia and sepsis. -creat improving Baseline creat 0.75 in 01/2016 Came in with 2.53--- 2.08--1.10--0.75  4. Acute  on chronic COPD on chronic oxygen. -management as above  5. Sleep apnea on CPAP at night    6. Essential hypertension blood pressure on the lower side.   7. Hypothyroidism unspecified on levothyroxine  8. Restless leg syndrome on  Mirapex   Case discussed with Care Management/Social Worker. Management plans discussed with the patient, family and they are in agreement.  CODE STATUS: DNR  DVT Prophylaxis: lovenox  TOTAL TIME TAKING CARE OF THIS PATIENT: 30 minutes.  >50% time spent on counselling and coordination of care   Note: This dictation was prepared with Dragon dictation along with smaller phrase technology. Any transcriptional errors that result from this process are unintentional.  Hannah Powell M.D on 07/12/2016 at 3:19 PM  Between 7am to 6pm - Pager - [phone]  After 6pm go to [email or web address] - password [email or web address] [place] [facility]  Office  [phone]  CC: Primary care physician; Hannah Hector, MD

## 2016-07-12 NOTE — Progress Notes (Signed)
[facility]  Patient Status: RT attempted delivery of Trilogy NIV today, husband not sure of discharge plan at this time. Contacted CM to make aware if patient is to d/c over the weekend we would need to deliver and educate by end of day 4/27.    Hannah Powell 07/12/2016, 12:51 PM

## 2016-07-12 NOTE — Progress Notes (Signed)
Patient struggling to breath on bi-pap and high-flow this am- decision was made by the family to intubate per Dr. Mortimer Fries- Patient intubated- sedated with fentanyl- patient is breathing comfortably- on levophed at 5 mcqs at this time- tube feeds started- foley in place- urine output great.  Family at bedside.

## 2016-07-12 NOTE — Procedures (Signed)
Endotracheal Intubation: Patient required placement of an artificial airway secondary to resp failure.   Consent: Emergent.   Hand washing performed prior to starting the procedure.   Medications administered for sedation prior to procedure: Midazolam 4 mg IV,  Vecuronium 10 mg IV, Fentanyl 100 mcg IV.   Procedure: A time out procedure was called and correct patient, name, & ID confirmed. Needed supplies and equipment were assembled and checked to include ETT, 10 ml syringe, Glidescope, Mac and Miller blades, suction, oxygen and bag mask valve, end tidal CO2 monitor. Patient was positioned to align the mouth and pharynx to facilitate visualization of the glottis.  Heart rate, SpO2 and blood pressure was continuously monitored during the procedure. Pre-oxygenation was conducted prior to intubation and endotracheal tube was placed through the vocal cords into the trachea.  During intubation an assistant applied gentle pressure to the cricoid cartilage.   The artificial airway was placed under direct visualization via glidescope route using a 8.0 ETT on the first attempt.    ETT was secured at 22 cm mark.    Placement was confirmed by auscuitation of lungs with good breath sounds bilaterally and no stomach sounds.  Condensation was noted on endotracheal tube.  Pulse ox 92%.  CO2 detector in place with appropriate color change.   Complications: None .   Operator: Elizet Kaplan/Blakeny  Chest radiograph ordered and pending.   Comments: OGT placed via glidescope.  Corrin Parker, M.D.  [facility]  Medical Director [place] Director [facility]io-P[facility] Department

## 2016-07-12 NOTE — Telephone Encounter (Signed)
Informed DK of pt's son calling. Informed son per DK that he would come to ICU to see her later today due to being in clinic. Pt's son informed and verbalized understanding and stating that he spoke with DR and that son wants 2nd opinion. Spoke with DR who states he has requested a 2nd opinion from Dr. Raul Del. Nothing further needed at this time.

## 2016-07-12 NOTE — Progress Notes (Signed)
I met with whole family, patient with progressive resp distress, suffering and struggling on biPAP. I conveyed patients options of Comfort care measures or proceed with MV and intubation.  Family has consented and agreed to MV and intubation and maintain DNR status.   Family are satisfied with Plan of action and management. All questions answered  Corrin Parker, M.D.  [facility]  Medical Director [place] Director [facility] Cardio-Pulmonary Department

## 2016-07-12 NOTE — Progress Notes (Addendum)
PT Cancellation Note  Patient Details Name: Hannah Powell MRN: [id number] DOB: [birth year]   Cancelled Treatment:    Reason Eval/Treat Not Completed: Patient at procedure or test/unavailable. CI at this time; pt critically ill with several procedures today. Re attempt at a later date as appropriate.    Larae Grooms, PTA 07/12/2016, 4:11 PM

## 2016-07-12 NOTE — Care Management (Signed)
RNCM notified by [facility] that patient's husband would not allow Trilogy to be delivered.

## 2016-07-12 NOTE — Progress Notes (Signed)
Pharmacy Antibiotic Note/ Electrolyte Monitoring  Hannah Powell is a 71 y.o. female admitted on 07/02/2016 with  pneumonia.  Pharmacy has been consulted for Unasyn dosing as well as for electrolyte monitoring and replacement. Patient receiving D10 with 20 mEq K+/L at 25 mL/hr.  Plan: 1. Continue Unasyn 3 g iv q 6 hours. Plan is for 10 day course of antibiotics per ID.   2. No replacement needed for electrolytes. Will f/u AM labs.   Height: 5\' 5"  (165.1 cm) Weight: 263 lb 3.7 oz (119.4 kg) IBW/kg (Calculated) : 57  Temp (24hrs), Avg:98 F (36.7 C), Min:97.3 F (36.3 C), Max:98.5 F (36.9 C)   Recent Labs Lab 07/07/16 0447 07/09/16 0501 07/10/16 0439 07/11/16 0415 07/12/16 0534  WBC 7.5 7.6 7.9  --  8.6  CREATININE 0.75 0.66 0.80 0.92 0.97    Estimated Creatinine Clearance: 70.9 mL/min (by C-G formula based on SCr of 0.97 mg/dL).    BMP Latest Ref Rng & Units 07/12/2016 07/11/2016 07/10/2016  Glucose 65 - 99 mg/dL 93 153(H) 105(H)  BUN 6 - 20 mg/dL 31(H) 21(H) 11  Creatinine 0.44 - 1.00 mg/dL 0.97 0.92 0.80  Sodium 135 - 145 mmol/L 139 140 142  Potassium 3.5 - 5.1 mmol/L 4.1 4.1 3.5  Chloride 101 - 111 mmol/L 82(L) 83(L) 87(L)  CO2 22 - 32 mmol/L 50(H) 50(H) 49(H)  Calcium 8.9 - 10.3 mg/dL 8.2(L) 7.9(L) 8.2(L)   Magnesium (mg/dL)  Date Value  07/12/2016 2.1  01/04/2013 2.0   Phosphorus (mg/dL)  Date Value  07/12/2016 3.3   Albumin (g/dL)  Date Value  07/02/2016 3.1 (L)  06/30/2014 3.8     Allergies  Allergen Reactions  . Lyrica [Pregabalin] Other (See Comments)    Reaction: made Restless Leg Syndrome worse     Antimicrobials this admission: 4/20 ceftriaxone >> 4/20 4/21 levofloxacin >>4/21 4/22 Unasyn >>  Dose adjustments this admission:  Microbiology results: 4/16 MRSA PCR: negative 4/16 BCx: STAPHYLOCOCCUS CAPITIS 4/17 Sputum: STREPTOCOCCUS PNEUMONIAE  4/19 BCx: NG x 5 days  Thank you for allowing pharmacy to be a part of this patient's  care.  Samin Milke 07/12/2016 11:37 AM

## 2016-07-12 NOTE — Progress Notes (Signed)
Daily Progress Note   Patient Name: Hannah Powell       Date: 07/12/2016 DOB: 1945-09-05  Age: 71 y.o. MRN#: [id number] Attending Physician: Fritzi Mandes, MD Primary Care Physician: Adin Hector, MD Admit Date: 07/02/2016  Reason for Consultation/Follow-up: Establishing goals of care  Subjective: Patient with respiratory distress. Tachypnea and using accessory muscles with BiPAP. Easily arouses to voice but remains confused.   Husband, son, and friend at bedside. Dr. Juanell Fairly has spoke with son on options--comfort versus need for intubation. He is requesting a second opinion. Son, Darrick Meigs, asked my thoughts on her condition. Spoke of decline in respiratory status from yesterday. She was on high flow and able to participate in the conversation. Today with tachypnea, accessory muscle usage, and increased drowsiness. Discussed diagnoses, interventions, and increased work of breathing likely in need of intubation in the near future. She told us yesterday she would NOT want to be intubated.   Husband questions how she has "worsened over night." Educated on trajectory of COPD and often coming to a point where interventions are not effective and focus transitions to comfort. Ran through med list with him. He is irritable and states "I don't know how we let it get to this."   Patient states "it is what it is." Son asks what I would do if this was my mom. I encouraged he consider keeping her comfortable because I feel she is suffering at this time. Also encouraged family to keep her at the center of decision making. Son tells me "people are coming and we will make a decision soon" in regards to intubation and after second opinion from Dr. Mortimer Fries.   Answered questions and concerns. Provided emotional and  spiritual support.   Length of Stay: 10  Current Medications: Scheduled Meds:  . aspirin EC  81 mg Oral Daily  . benzonatate  200 mg Oral TID  . budesonide (PULMICORT) nebulizer solution  0.25 mg Nebulization Q6H  . chlorhexidine  15 mL Mouth Rinse BID  . chlorpheniramine-HYDROcodone  5 mL Oral Q12H  . enoxaparin (LOVENOX) injection  40 mg Subcutaneous Q12H  . [START ON 07/13/2016] furosemide  40 mg Intravenous Daily  . ipratropium-albuterol  3 mL Nebulization Q6H  . levothyroxine  200 mcg Oral QAC breakfast  . losartan  25 mg Oral Daily  .  metolazone  5 mg Oral Daily  . pantoprazole (PROTONIX) IV  40 mg Intravenous Q24H  . pramipexole  4 mg Oral QHS  . senna-docusate  2 tablet Oral BID  . traZODone  100 mg Oral QHS    Continuous Infusions: . ampicillin-sulbactam (UNASYN) IV 3 g (07/12/16 1225)  . dextrose 10 % with additives Pediatric IV fluid 25 mL/hr at 07/12/16 1225    PRN Meds: acetaminophen, albuterol, ALPRAZolam, guaiFENesin, ibuprofen, menthol-cetylpyridinium, morphine injection, ondansetron (ZOFRAN) IV  Physical Exam  Constitutional: She is easily aroused. She appears ill. She appears distressed.  HENT:  Head: Normocephalic and atraumatic.  Cardiovascular: Regular rhythm.   Pulmonary/Chest: Accessory muscle usage present. Tachypnea noted. She has decreased breath sounds.  Musculoskeletal: She exhibits edema (generalized).  Neurological: She is easily aroused.  Arouses to voice/pleasantly confused  Skin: Skin is warm and dry. There is pallor.  Psychiatric: Cognition and memory are impaired. She is inattentive.  Nursing note and vitals reviewed.          Vital Signs: BP 127/66   Pulse 98   Temp 98.1 F (36.7 C) (Axillary)   Resp (!) 35   Ht 5\' 5"  (1.651 m)   Wt 119.4 kg (263 lb 3.7 oz)   SpO2 (!) 87%   BMI 43.80 kg/m  SpO2: SpO2: (!) 87 % O2 Device: O2 Device: High Flow Nasal Cannula O2 Flow Rate: O2 Flow Rate (L/min): 40 L/min  Intake/output summary:     Intake/Output Summary (Last 24 hours) at 07/12/16 1231 Last data filed at 07/12/16 1000  Gross per 24 hour  Intake             1200 ml  Output             1200 ml  Net                0 ml   LBM: Last BM Date: 07/06/16 Baseline Weight: Weight: 113.4 kg (250 lb) Most recent weight: Weight: 119.4 kg (263 lb 3.7 oz)  Palliative Assessment/Data: PPS 20%   Flowsheet Rows     Most Recent Value  Intake Tab  Referral Department  Critical care  Unit at Time of Referral  ICU  Palliative Care Primary Diagnosis  Pulmonary  Date Notified  07/10/16  Palliative Care Type  New Palliative care  Reason for referral  Clarify Goals of Care  Date of Admission  07/02/16  Date first seen by Palliative Care  07/11/16  # of days Palliative referral response time  1 Day(s)  # of days IP prior to Palliative referral  8  Clinical Assessment  Palliative Performance Scale Score  20%  Psychosocial & Spiritual Assessment  Palliative Care Outcomes  Patient/Family meeting held?  Yes  Who was at the meeting?  patient, son, husband, friend  Palliative Care Outcomes  Clarified goals of care, ACP counseling assistance, Provided psychosocial or spiritual support, Provided end of life care assistance      Patient Active Problem List   Diagnosis Date Noted  . Community acquired pneumonia of right lung   . Palliative care by specialist   . Goals of care, counseling/discussion   . Pressure injury of skin 07/03/2016  . Acute respiratory failure with hypoxia and hypercapnia ([place]) 07/02/2016  . Acute on chronic diastolic CHF (congestive heart failure) ([place]) 07/21/2015  . COPD exacerbation ([place]) 07/21/2015  . Acute renal insufficiency 07/21/2015  . Hyperglycemia 07/21/2015  . Benign essential HTN 07/21/2015  . Acute on chronic respiratory  failure with hypoxia and hypercapnia ([place]) 07/18/2015  . S/P shoulder replacement 07/02/2013    Palliative Care Assessment & Plan   Patient Profile: 71 y.o. female   with past medical history of COPD on home oxygen, sleep apnea with CPAP HS, restless leg, hypertension, hypothyroidism, diastolic CHF, arthritis, and anxiety admitted on 07/02/2016 with respiratory distress and altered mental status. In ED, was in respiratory distress and placed on BiPAP. Found to have large right-sided pneumonia. Patient has acute on chronic hypoxic and hypercapnic respiratory failure complicated by pneumococcal pneumonia and severe COPD. Palliative medicine consultation for goals of care.   Assessment: Acute on chronic hypoxic/hypercarbic respiratory failure Severe COPD Acute diastolic CHF Sepsis with right-sided pneumonia Acute kidney injury due to ATN Sleep apnea on CPAP HS Essential hypertension Hypothyroidism   Recommendations/Plan:  Family waiting for second opinion.  Patient confirmed DNR/DNI yesterday but family may revoke this.  Nausea during my conversation--RN to give zofran.  PMT will continue to support patient/family through hospitalization.  Goals of Care and Additional Recommendations:  Limitations on Scope of Treatment: Full Scope Treatment DNR/DNI ?  Code Status: DNR/DNI   Code Status Orders        Start     Ordered   07/02/16 1244  Do not attempt resuscitation (DNR)  Continuous    Question Answer Comment  In the event of cardiac or respiratory ARREST Do not call a "code blue"   In the event of cardiac or respiratory ARREST Do not perform Intubation, CPR, defibrillation or ACLS   In the event of cardiac or respiratory ARREST Use medication by any route, position, wound care, and other measures to relive pain and suffering. May use oxygen, suction and manual treatment of airway obstruction as needed for comfort.   Comments nurse may pronounce      07/02/16 1244    Code Status History    Date Active Date Inactive Code Status Order ID Comments User Context   07/18/2015  1:24 PM 07/22/2015  3:51 PM Full Code [id number]  Flora Lipps, MD ED    07/02/2013 11:24 AM 07/03/2013  1:35 PM Full Code [id number]  Jenetta Loges, PA-C Inpatient       Prognosis:   Unable to determine guarded with clinical decline today--worsening respiratory status on BiPAP secondary to pneumonia and severe COPD.   Discharge Planning:  To Be Determined  Care plan was discussed with patient, Dr. Ashby Dawes, family, and RN  Thank you for allowing the Palliative Medicine Team to assist in the care of this patient.   Time In: 1115 Time Out: 1230 Total Time 77min Prolonged Time Billed yes      Greater than 50%  of this time was spent counseling and coordinating care related to the above assessment and plan.  Ihor Dow, FNP-C Palliative Medicine Team  Phone: [phone] Fax: [phone]  Please contact Palliative Medicine Team phone at [phone] for questions and concerns.

## 2016-07-13 ENCOUNTER — Inpatient Hospital Stay: Payer: Medicare HMO

## 2016-07-13 ENCOUNTER — Encounter: Payer: Self-pay | Admitting: Radiology

## 2016-07-13 DIAGNOSIS — R509 Fever, unspecified: Secondary | ICD-10-CM

## 2016-07-13 DIAGNOSIS — J13 Pneumonia due to Streptococcus pneumoniae: Secondary | ICD-10-CM

## 2016-07-13 DIAGNOSIS — J9600 Acute respiratory failure, unspecified whether with hypoxia or hypercapnia: Secondary | ICD-10-CM

## 2016-07-13 LAB — CBC WITH DIFFERENTIAL/PLATELET
BASOS PCT: 0 %
Basophils Absolute: 0 10*3/uL (ref 0–0.1)
Eosinophils Absolute: 0 10*3/uL (ref 0–0.7)
Eosinophils Relative: 0 %
HEMATOCRIT: 40.1 % (ref 35.0–47.0)
HEMOGLOBIN: 13.1 g/dL (ref 12.0–16.0)
Lymphocytes Relative: 8 %
Lymphs Abs: 0.7 10*3/uL — ABNORMAL LOW (ref 1.0–3.6)
MCH: 30.6 pg (ref 26.0–34.0)
MCHC: 32.6 g/dL (ref 32.0–36.0)
MCV: 93.8 fL (ref 80.0–100.0)
MONO ABS: 0.8 10*3/uL (ref 0.2–0.9)
Monocytes Relative: 8 %
NEUTROS ABS: 7.6 10*3/uL — AB (ref 1.4–6.5)
NEUTROS PCT: 84 %
Platelets: 154 10*3/uL (ref 150–440)
RBC: 4.27 MIL/uL (ref 3.80–5.20)
RDW: 15.6 % — AB (ref 11.5–14.5)
WBC: 9.2 10*3/uL (ref 3.6–11.0)

## 2016-07-13 LAB — BASIC METABOLIC PANEL
Anion gap: 9 (ref 5–15)
BUN: 38 mg/dL — ABNORMAL HIGH (ref 6–20)
CHLORIDE: 81 mmol/L — AB (ref 101–111)
CO2: 50 mmol/L — ABNORMAL HIGH (ref 22–32)
CREATININE: 0.95 mg/dL (ref 0.44–1.00)
Calcium: 8.2 mg/dL — ABNORMAL LOW (ref 8.9–10.3)
GFR calc non Af Amer: 60 mL/min — ABNORMAL LOW (ref >60)
Glucose, Bld: 134 mg/dL — ABNORMAL HIGH (ref 65–99)
POTASSIUM: 3.7 mmol/L (ref 3.5–5.1)
SODIUM: 140 mmol/L (ref 135–145)

## 2016-07-13 LAB — MAGNESIUM
MAGNESIUM: 1.7 mg/dL (ref 1.7–2.4)
Magnesium: 2.1 mg/dL (ref 1.7–2.4)

## 2016-07-13 LAB — GLUCOSE, CAPILLARY
GLUCOSE-CAPILLARY: 143 mg/dL — AB (ref 65–99)
Glucose-Capillary: 120 mg/dL — ABNORMAL HIGH (ref 65–99)
Glucose-Capillary: 125 mg/dL — ABNORMAL HIGH (ref 65–99)
Glucose-Capillary: 128 mg/dL — ABNORMAL HIGH (ref 65–99)
Glucose-Capillary: 139 mg/dL — ABNORMAL HIGH (ref 65–99)
Glucose-Capillary: 171 mg/dL — ABNORMAL HIGH (ref 65–99)

## 2016-07-13 LAB — PHOSPHORUS
PHOSPHORUS: 1.6 mg/dL — AB (ref 2.5–4.6)
PHOSPHORUS: 8.6 mg/dL — AB (ref 2.5–4.6)

## 2016-07-13 MED ORDER — BISACODYL 10 MG RE SUPP
10.0000 mg | Freq: Every day | RECTAL | Status: DC | PRN
  Administered 2016-07-13: 10 mg via RECTAL
  Filled 2016-07-13: qty 1

## 2016-07-13 MED ORDER — PRAMIPEXOLE DIHYDROCHLORIDE 1 MG PO TABS
4.0000 mg | ORAL_TABLET | Freq: Every day | ORAL | Status: DC
  Administered 2016-07-13 – 2016-07-17 (×5): 4 mg
  Filled 2016-07-13 (×5): qty 4

## 2016-07-13 MED ORDER — IOPAMIDOL (ISOVUE-300) INJECTION 61%
30.0000 mL | Freq: Once | INTRAVENOUS | Status: AC
  Administered 2016-07-13: 30 mL via ORAL

## 2016-07-13 MED ORDER — VANCOMYCIN HCL 10 G IV SOLR
1250.0000 mg | Freq: Once | INTRAVENOUS | Status: AC
  Administered 2016-07-13: 1250 mg via INTRAVENOUS
  Filled 2016-07-13: qty 1250

## 2016-07-13 MED ORDER — ASPIRIN 81 MG PO CHEW
81.0000 mg | CHEWABLE_TABLET | Freq: Every day | ORAL | Status: DC
  Administered 2016-07-13 – 2016-07-17 (×5): 81 mg
  Filled 2016-07-13 (×6): qty 1

## 2016-07-13 MED ORDER — ACETAMINOPHEN 325 MG PO TABS
650.0000 mg | ORAL_TABLET | ORAL | Status: DC | PRN
  Administered 2016-07-13 – 2016-07-14 (×5): 650 mg via ORAL
  Filled 2016-07-13 (×5): qty 2

## 2016-07-13 MED ORDER — LEVOTHYROXINE SODIUM 75 MCG PO TABS
200.0000 ug | ORAL_TABLET | Freq: Every day | ORAL | Status: DC
  Administered 2016-07-14 – 2016-07-18 (×5): 200 ug
  Filled 2016-07-13 (×4): qty 1
  Filled 2016-07-13: qty 2

## 2016-07-13 MED ORDER — IOPAMIDOL (ISOVUE-300) INJECTION 61%
100.0000 mL | Freq: Once | INTRAVENOUS | Status: AC | PRN
  Administered 2016-07-13: 100 mL via INTRAVENOUS

## 2016-07-13 MED ORDER — POTASSIUM PHOSPHATES 15 MMOLE/5ML IV SOLN
30.0000 mmol | Freq: Once | INTRAVENOUS | Status: AC
  Administered 2016-07-13: 30 mmol via INTRAVENOUS
  Filled 2016-07-13: qty 10

## 2016-07-13 MED ORDER — METOLAZONE 5 MG PO TABS
5.0000 mg | ORAL_TABLET | Freq: Every day | ORAL | Status: DC
  Administered 2016-07-13 – 2016-07-14 (×2): 5 mg
  Filled 2016-07-13 (×2): qty 1

## 2016-07-13 MED ORDER — PANTOPRAZOLE SODIUM 40 MG PO PACK
40.0000 mg | PACK | Freq: Every day | ORAL | Status: DC
  Administered 2016-07-13 – 2016-07-18 (×6): 40 mg
  Filled 2016-07-13 (×6): qty 20

## 2016-07-13 MED ORDER — MAGNESIUM SULFATE 2 GM/50ML IV SOLN
2.0000 g | Freq: Once | INTRAVENOUS | Status: AC
  Administered 2016-07-13: 2 g via INTRAVENOUS
  Filled 2016-07-13: qty 50

## 2016-07-13 MED ORDER — PIPERACILLIN-TAZOBACTAM 3.375 G IVPB
3.3750 g | Freq: Three times a day (TID) | INTRAVENOUS | Status: DC
  Administered 2016-07-13 – 2016-07-18 (×15): 3.375 g via INTRAVENOUS
  Filled 2016-07-13 (×19): qty 50

## 2016-07-13 MED ORDER — SENNOSIDES-DOCUSATE SODIUM 8.6-50 MG PO TABS
2.0000 | ORAL_TABLET | Freq: Two times a day (BID) | ORAL | Status: DC
  Administered 2016-07-13 – 2016-07-17 (×5): 2
  Filled 2016-07-13 (×5): qty 2

## 2016-07-13 MED ORDER — IBUPROFEN 800 MG PO TABS
800.0000 mg | ORAL_TABLET | Freq: Once | ORAL | Status: AC
  Administered 2016-07-13: 800 mg via ORAL
  Filled 2016-07-13: qty 1

## 2016-07-13 MED ORDER — POTASSIUM & SODIUM PHOSPHATES 280-160-250 MG PO PACK
2.0000 | PACK | ORAL | Status: AC
  Administered 2016-07-13 (×2): 2 via ORAL
  Filled 2016-07-13 (×2): qty 2

## 2016-07-13 MED ORDER — VITAL HIGH PROTEIN PO LIQD
1000.0000 mL | ORAL | Status: DC
  Administered 2016-07-14 – 2016-07-17 (×5): 1000 mL

## 2016-07-13 MED ORDER — HYDROCORTISONE NA SUCCINATE PF 100 MG IJ SOLR
100.0000 mg | Freq: Three times a day (TID) | INTRAMUSCULAR | Status: DC
  Administered 2016-07-13 – 2016-07-16 (×10): 100 mg via INTRAVENOUS
  Filled 2016-07-13 (×10): qty 2

## 2016-07-13 MED ORDER — VANCOMYCIN HCL IN DEXTROSE 1-5 GM/200ML-% IV SOLN
1000.0000 mg | Freq: Two times a day (BID) | INTRAVENOUS | Status: DC
  Administered 2016-07-13 – 2016-07-15 (×4): 1000 mg via INTRAVENOUS
  Filled 2016-07-13 (×6): qty 200

## 2016-07-13 NOTE — Progress Notes (Signed)
Initial Nutrition Assessment  DOCUMENTATION CODES:   Morbid obesity  INTERVENTION:  -TF: recommend Vital High Protein at rate of 60 ml/hr providing 1440 kcals, 127 g of protein and 1210 mL of free water. PEPuP initiated.  -Discussed no BM x 1 week during ICU rounds, bowel regimen adjusted  NUTRITION DIAGNOSIS:   Inadequate oral intake related to acute illness as evidenced by NPO status.  Being addressed via TF  GOAL:   Provide needs based on ASPEN/SCCM guidelines  Met  MONITOR:   TF tolerance, Vent status, Labs, Weight trends  REASON FOR ASSESSMENT:   Ventilator, Consult Enteral/tube feeding initiation and management  ASSESSMENT:   71 yo female admitted with acute respiratory failure and sepsis with pneumonia on 4/16, on and off Bipap, ultimately requiring intubation on 4/26.  Pt with hx of CHF, COPD, HTN  Pt currently sedated on vent, febrile this AM Adult Tube Feeding Protocol ordered 4/26 Vital High Protein infusing at 40 ml/hr with Prostat BID No BM x 1 week  Labs: phosphorus 1.6 (supplemented) Meds: senna-docusate started today  Diet Order:   NPO  Skin:  Wound (see comment) (stage II sacrum)  Last BM:  07/06/16  Height:   Ht Readings from Last 1 Encounters:  07/08/16 _0  (1.651 m)    Weight:   Wt Readings from Last 1 Encounters:  07/13/16 243 lb 13.3 oz (110.6 kg)   BMI:  Body mass index is 40.58 kg/m.  Estimated Nutritional Needs:   Kcal:  6967-8938 kcals  Protein:  >/= 114 g  Fluid:  >/= 1.5 L  EDUCATION NEEDS:   No education needs identified at this time  [place], [place], [place] [phone] Pager  [phone] Weekend/On-Call Pager

## 2016-07-13 NOTE — Progress Notes (Signed)
[place] [facility] [place] NAME: Hannah Powell    MR#:  [id number]  DATE OF BIRTH:  [birth year]  SUBJECTIVE:  Patient got intubated yesterday. Spiking fever of 101-103  Husband and daughter in the room  REVIEW OF SYSTEMS:   Review of Systems  Unable to perform ROS: Intubated   DRUG ALLERGIES:   Allergies  Allergen Reactions  . Lyrica [Pregabalin] Other (See Comments)    Reaction: made Restless Leg Syndrome worse     VITALS:  Blood pressure (!) 77/67, pulse 82, temperature (!) 103.3 F (39.6 C), temperature source Rectal, resp. rate 16, height '5\' 5"'  (1.651 m), weight 110.6 kg (243 lb 13.3 oz), SpO2 90 %.  PHYSICAL EXAMINATION:   Physical Exam  GENERAL:  71 y.o.-year-old patient lying in the bed with moderate  distress. ObeseCritically ill  EYES: Pupils equal, round, reactive to light and accommodation. No scleral icterus. Extraocular muscles intact.  HEENT: Head atraumatic, normocephalic. Oropharynx and nasopharynx clear.Intubated and on the ventK:  Supple, no jugular venous distention. No thyroid enlargement, no tenderness.  LUNGS:distant breath sounds bilaterally, no wheezing,++ rales,no rhonchi. Shallow breathing CARDIOVASCULAR: S1, S2 normal. No murmurs, rubs, or gallops.  ABDOMEN: Soft, nontender, nondistended. Bowel sounds present. No organomegaly or mass.  EXTREMITIES: No cyanosis, clubbing or edema b/l.    NEUROLOGIC:Intubated Psychiatric: Intubated  SKIN: No obvious rash, lesion, or ulcer.   LABORATORY PANEL:  CBC  Recent Labs Lab 07/13/16 0434  WBC 9.2  HGB 13.1  HCT 40.1  PLT 154    Chemistries   Recent Labs Lab 07/13/16 0434  NA 140  K 3.7  CL 81*  CO2 50*  GLUCOSE 134*  BUN 38*  CREATININE 0.95  CALCIUM 8.2*  MG 1.7   Cardiac Enzymes No results for input(s): TROPONINI in the last 168 hours. RADIOLOGY:  Dg Abd 1 View  Result Date: 07/12/2016 CLINICAL DATA:  Post endotracheal tube placement, OG  tube placement EXAM: ABDOMEN - 1 VIEW COMPARISON:  Portable chest x-ray of 07/10/2016 FINDINGS: The tip of the endotracheal tube is approximately 4.0 cm above the carina. Left IJ central venous line tip overlies the mid lower SVC. A NG tube extends into the stomach. The lungs appear better aerated. Bibasilar opacities remain consistent with atelectasis and possible small effusions. Pleuroparenchymal opacities are unchanged and the right lung apex. Technically the film is overpenetrated making assessment of the left lung difficult. IMPRESSION: 1. Endotracheal tube tip 4.0 cm above the carina. 2. NG tube extends into the stomach. 3. Left central venous line tip overlies the mid lower SVC Electronically Signed   By: Ivar Drape M.D.   On: 07/12/2016 16:03   Dg Chest Port 1 View  Result Date: 07/13/2016 CLINICAL DATA:  71 year old female with acute respiratory failure. EXAM: PORTABLE CHEST 1 VIEW COMPARISON:  Chest radiograph dated 07/12/2016 FINDINGS: Endotracheal tube above the carina in similar positioning. Left IJ central line in stable position. Enteric tube extends into the left hemiabdomen with tip beyond the inferior margin of the image. There is mild cardiomegaly. There is prominence of the central pulmonary arteries likely representing a degree of underlying pulmonary hypertension. Bibasilar and right upper lobe airspace densities again noted, similar or slightly worsened. Small bilateral pleural effusions. No pneumothorax. Partially visualized lower cervical fusion hardware. Left shoulder hemiarthroplasty. No acute fracture. IMPRESSION: 1. Bibasilar and right upper lobe airspace opacities, similar or slightly worsened compared to the prior radiograph. 2. Small bilateral pleural effusions similar to  prior radiograph. Electronically Signed   By: Anner Crete M.D.   On: 07/13/2016 06:52   Dg Chest Port 1 View  Result Date: 07/12/2016 CLINICAL DATA:  Endotracheal tube placement. EXAM: PORTABLE CHEST 1  VIEW COMPARISON:  Chest radiograph July 10, 2016 FINDINGS: Endotracheal tube tip projects 3.5 cm above the carina. LEFT internal jugular central venous catheter distal tip projects in proximal superior vena cava. Nasogastric tube side port projecting in proximal stomach, distal tip out of field-of-view. Increasing bibasilar airspace opacities, obscuring the hemidiaphragms. Cardiac silhouette is mildly enlarged, mildly calcified aortic knob. Improved RIGHT upper lobe consolidation with persistent interstitial prominence. Small pleural effusions. No pneumothorax. LEFT humeral arthroplasty, osseous structures unchanged. ACDF. IMPRESSION: Endotracheal tube tip projects 3.5 cm above the carina. LEFT internal jugular central venous catheter distal tip projects in proximal superior vena cava. Nasogastric tube side port projecting in proximal stomach, distal tip out of field-of-view. Worsening bibasilar consolidation with improved aeration of RIGHT upper lobe, findings suggest shifting pulmonary edema, pneumonia is possible. Small pleural effusions. Electronically Signed   By: Elon Alas M.D.   On: 07/12/2016 16:02   ASSESSMENT AND PLAN:  Hannah Powell a 71 y.o.femalepresented with respiratory distress. She stated on Thursday she started feeling bad. She needed to increase her oxygen from 2 L to 3 L. On Saturday she had shaking chills and fever.   1. Acute on chronic hypoxic/hypercarbic respiratory failure with hypoxia and acidosis.  -Appears combination of severe COPD and CHF acute diastolic (Echo in the past EF 65%) -Patient continued to worsen despite aggressive medical management. -family had discussion with DR Mortimer Fries. -Patient intubated on  (07/12/16) -IV fentanyl drip -Currently on Levophed -Spiking high-grade fever  2. Clinical sepsis with Right-sided pneumonia, leukocytosis and tachypnea. IV Vanco and Zosyn  - repeat blood cultures from 4/27 negative  - sputum cultures pending   3. Acute  kidney injury due to ATN from pneumonia and sepsis. -creat improving Baseline creat 0.75 in 01/2016 Came in with 2.53--- 2.08--1.10--0.75  4. Acute on chronic COPD on chronic oxygen. -management as above  5.History of obstructive sleep apnea   6.  history of Essential hypertension patient is hypotensive on IV levophed  7. Hypothyroidism    8. Palliative care has seen and met patient and family. -Patient remains DO NOT RESUSCITATE  Overall critically ill  Case discussed with Care Management/Social Worker. Management plans discussed with the patient, family and they are in agreement.  CODE STATUS: DNR  DVT Prophylaxis: lovenox  TOTAL TIME TAKING CARE OF THIS PATIENT: 30 minutes.  >50% time spent on counselling and coordination of careHusband and daughter   Note: This dictation was prepared with Dragon dictation along with smaller phrase technology. Any transcriptional errors that result from this process are unintentional.  [place] Fassnacht M.D on 07/13/2016 at 2:09 PM  Between 7am to 6pm - Pager - [phone]  After 6pm go to [email or web address] - password [email or web address] [facility]  Office  [phone]  CC: Primary care physician; Adin Hector, MD

## 2016-07-13 NOTE — Progress Notes (Signed)
Attempted to retrieve sputum sample on 2 different occasions, unsuccessful each attempt. RN aware.

## 2016-07-13 NOTE — Progress Notes (Signed)
PT Cancellation Note  Patient Details Name: Hannah Powell MRN: [id number] DOB: Jul 06, 1945   Cancelled Treatment:    Reason Eval/Treat Not Completed: Medical issues which prohibited therapy (Per chart review, patient now intubated/sedated due to further respiratory decline.  Will complete initial order at this time; please re-consult as medically appropriate.)   Maryetta Shafer H. Owens Shark, PT, DPT, NCS 07/13/16, 8:49 AM [phone]

## 2016-07-13 NOTE — Progress Notes (Signed)
RN spoke with Dr. Lyndel Safe about fever of 102.9 and that tylenol has been given around 0500 and can not be given at this time and that room is very cool.  MD gave order for 800 mg motrin.

## 2016-07-13 NOTE — Progress Notes (Signed)
PULMONARY / CRITICAL CARE MEDICINE   Name: Hannah Powell MRN: [id number] DOB: 1945-11-24    ADMISSION DATE:  07/02/2016    SIGNIFICANT EVENTS: Noted events overnight. Patient on NE. Spiking fever overnight. Abdomen distended.   HISTORY OF PRESENT ILLNESS:   71 y.o.F former smoker pt of pulmonologist Dr. Mortimer Fries admitted 04/16 with acute on chronic hypercapnic hypoxic respiratory failure secondary to AECOPD and right sided pneumonia. Intubated 4/26.  PAST MEDICAL HISTORY :   has a past medical history of Anxiety; Arthritis; CHF (congestive heart failure) ([place]); COPD (chronic obstructive pulmonary disease) ([place]); Hypertension; Hypothyroidism; RLS (restless legs syndrome); Shortness of breath; and Sleep apnea.  has a past surgical history that includes Back surgery; Joint replacement; Foot arthroplasty; Total shoulder arthroplasty (Left, 07/02/2013); and Breast biopsy (Left).   Prior to Admission medications   Medication Sig Start Date End Date Taking? Authorizing Provider  aspirin EC 81 MG tablet Take 81 mg by mouth daily.   Yes Historical Provider, MD  B Complex-C (B-COMPLEX WITH VITAMIN C) tablet Take 1 tablet by mouth daily.   Yes Historical Provider, MD  Calcium Carbonate-Vitamin D (CALCIUM-VITAMIN D) 500-200 MG-UNIT per tablet Take 1 tablet by mouth daily.   Yes Historical Provider, MD  chlorpheniramine-HYDROcodone (TUSSIONEX PENNKINETIC ER) 10-8 MG/5ML SUER Take 5 mLs by mouth 2 (two) times daily. Patient taking differently: Take 5 mLs by mouth 2 (two) times daily as needed.  07/21/15  Yes Theodoro Grist, MD  doxycycline (VIBRA-TABS) 100 MG tablet Take 1 tablet (100 mg total) by mouth 2 (two) times daily. 03/02/16  Yes Wilhelmina Mcardle, MD  fluticasone furoate-vilanterol (BREO ELLIPTA) 100-25 MCG/INH AEPB Inhale 1 puff into the lungs daily. 02/22/16  Yes Flora Lipps, MD  furosemide (LASIX) 40 MG tablet Take 1 tablet (40 mg total) by mouth daily. 07/21/15  Yes Theodoro Grist, MD  guaiFENesin  (MUCINEX) 600 MG 12 hr tablet Take 1 tablet (600 mg total) by mouth 2 (two) times daily. 07/21/15  Yes Theodoro Grist, MD  guaiFENesin-codeine 100-10 MG/5ML syrup Take 10 mLs by mouth every 4 (four) hours as needed for cough. 07/21/15  Yes Theodoro Grist, MD  ipratropium-albuterol (DUONEB) 0.5-2.5 (3) MG/3ML SOLN Take 3 mLs by nebulization every 4 (four) hours. Patient taking differently: Take 3 mLs by nebulization every 4 (four) hours as needed.  07/21/15  Yes Theodoro Grist, MD  levothyroxine (SYNTHROID, LEVOTHROID) 200 MCG tablet Take 200 mcg by mouth daily before breakfast.   Yes Historical Provider, MD  losartan (COZAAR) 25 MG tablet Take 25 mg by mouth daily.   Yes Historical Provider, MD  metolazone (ZAROXOLYN) 5 MG tablet Take 5 mg by mouth daily. 01/20/16 01/19/17 Yes Historical Provider, MD  nitrofurantoin, macrocrystal-monohydrate, (MACROBID) 100 MG capsule Take 1 capsule (100 mg total) by mouth 2 (two) times daily. 06/30/16  Yes Frederich Cha, MD  Omega-3 Fatty Acids (OMEGA 3 PO) Take 1 capsule by mouth daily.   Yes Historical Provider, MD  phenazopyridine (PYRIDIUM) 200 MG tablet Take 1 tablet (200 mg total) by mouth 3 (three) times daily as needed for pain. 06/30/16  Yes Frederich Cha, MD  pramipexole (MIRAPEX) 1 MG tablet Take 4 mg by mouth at bedtime.    Yes Historical Provider, MD  roflumilast (DALIRESP) 500 MCG TABS tablet Take 1 tablet (500 mcg total) by mouth daily. 02/16/16  Yes Flora Lipps, MD  zolpidem (AMBIEN) 10 MG tablet Take 10 mg by mouth at bedtime.  12/30/15  Yes Historical Provider, MD   Allergies  Allergen Reactions  . Lyrica [Pregabalin] Other (See Comments)    Reaction: made Restless Leg Syndrome worse       VITAL SIGNS: Temp:  [98.9 F (37.2 C)-103.3 F (39.6 C)] 102.2 F (39 C) (04/27 1600) Pulse Rate:  [81-103] 83 (04/27 1600) Resp:  [15-21] 16 (04/27 1600) BP: (34-162)/(25-130) 105/56 (04/27 1600) SpO2:  [86 %-96 %] 94 % (04/27 1600) FiO2 (%):  [65 %-70 %] 65 %  (04/27 1451) Weight:  [243 lb 13.3 oz (110.6 kg)] 243 lb 13.3 oz (110.6 kg) (04/27 0500) HEMODYNAMICS: CVP:  [6 mmHg-7 mmHg] 7 mmHg VENTILATOR SETTINGS: Vent Mode: PRVC FiO2 (%):  [65 %-70 %] 65 % Set Rate:  [16 bmp] 16 bmp Vt Set:  [500 mL] 500 mL PEEP:  [5 cmH20] 5 cmH20 INTAKE / OUTPUT:  Intake/Output Summary (Last 24 hours) at 07/13/16 1605 Last data filed at 07/13/16 1600  Gross per 24 hour  Intake          3184.87 ml  Output             2450 ml  Net           734.87 ml    PHYSICAL EXAMINATION: General:  Intubated, sedated Neuro:  Moves all extremities to painful stimuli HEENT:  ett+ Cardiovascular:  s1s2+, regular Lungs:  Decreased air entry b/l, no wheezing/crackles Abdomen:  Distended, decreased bowel sounds Ext: no edema  LABS:  CBC  Recent Labs Lab 07/10/16 0439 07/12/16 0534 07/13/16 0434  WBC 7.9 8.6 9.2  HGB 13.3 12.5 13.1  HCT 40.8 39.2 40.1  PLT 105* 131* 154   Coag's No results for input(s): APTT, INR in the last 168 hours. BMET  Recent Labs Lab 07/11/16 0415 07/12/16 0534 07/13/16 0434  NA 140 139 140  K 4.1 4.1 3.7  CL 83* 82* 81*  CO2 50* 50* 50*  BUN 21* 31* 38*  CREATININE 0.92 0.97 0.95  GLUCOSE 153* 93 134*   Electrolytes  Recent Labs Lab 07/11/16 0415 07/12/16 0534 07/12/16 1714 07/13/16 0434  CALCIUM 7.9* 8.2*  --  8.2*  MG 2.1 2.1 1.7 1.7  PHOS 4.6 3.3 2.7 1.6*   Sepsis Markers No results for input(s): LATICACIDVEN, PROCALCITON, O2SATVEN in the last 168 hours. ABG  Recent Labs Lab 07/10/16 0356 07/12/16 1600  PHART 7.37 7.45  PCO2ART 94* 89*  PO2ART 79* 66*   Liver Enzymes No results for input(s): AST, ALT, ALKPHOS, BILITOT, ALBUMIN in the last 168 hours. Cardiac Enzymes No results for input(s): TROPONINI, PROBNP in the last 168 hours. Glucose  Recent Labs Lab 07/12/16 1737 07/12/16 1936 07/12/16 2359 07/13/16 0353 07/13/16 0718 07/13/16 1132  GLUCAP 103* 111* 120* 139* 128* 143*     Imaging Dg Chest Port 1 View  Result Date: 07/13/2016 CLINICAL DATA:  71 year old female with acute respiratory failure. EXAM: PORTABLE CHEST 1 VIEW COMPARISON:  Chest radiograph dated 07/12/2016 FINDINGS: Endotracheal tube above the carina in similar positioning. Left IJ central line in stable position. Enteric tube extends into the left hemiabdomen with tip beyond the inferior margin of the image. There is mild cardiomegaly. There is prominence of the central pulmonary arteries likely representing a degree of underlying pulmonary hypertension. Bibasilar and right upper lobe airspace densities again noted, similar or slightly worsened. Small bilateral pleural effusions. No pneumothorax. Partially visualized lower cervical fusion hardware. Left shoulder hemiarthroplasty. No acute fracture. IMPRESSION: 1. Bibasilar and right upper lobe airspace opacities, similar or slightly worsened compared to the prior radiograph.  2. Small bilateral pleural effusions similar to prior radiograph. Electronically Signed   By: Anner Crete M.D.   On: 07/13/2016 06:52    ASSESSMENT: ACUTE HYPOXIC RESP FAILURE / ARDS SHOCK ? 2 TO SEPSIS PNEUMONIA ALTERED MENTAL STATUS THROMBOCTOPENIA   PLAN Vasopressors for MAP>65. Will switch to zosyn/vanc. Ct chest/abd/pelvis for fever work-up. Wean FiO2/PEEP for sats >90%. Will check bladder pressures. Monitor closely. Stress dose steroids.  DVT/GI prophylaxis. Glycemic control. DNR Family updated at bedside.  Cc: 32 minutes   Dimas Chyle MD Pulmonary and Critical Care Medicine    07/13/2016, 4:05 PM

## 2016-07-13 NOTE — Progress Notes (Signed)
Pharmacy Antibiotic Note/ Electrolyte Monitoring  Hannah Powell is a 71 y.o. female admitted on 07/02/2016 with  pneumonia.  Pharmacy has been consulted for vancomycin and Zosyn dosing as well as for electrolyte monitoring and replacement.   Plan: 1. Patient with persistent fevers and patient panned cultured. Coverage expanded and will initiate Zosyn EI 3.375g IV Q8hr.   2. Will start vancomycin 1000mg  IV Q12hr for goal trough of 15-20. Will obtain trough prior to 5th dose of vancomycin.     3. No replacement needed for electrolytes. Will f/u AM labs.   Height: 5\' 5"  (165.1 cm) Weight: 243 lb 13.3 oz (110.6 kg) IBW/kg (Calculated) : 57  Temp (24hrs), Avg:102 F (38.9 C), Min:99.4 F (37.4 C), Max:103.3 F (39.6 C)   Recent Labs Lab 07/07/16 0447 07/09/16 0501 07/10/16 0439 07/11/16 0415 07/12/16 0534 07/13/16 0434  WBC 7.5 7.6 7.9  --  8.6 9.2  CREATININE 0.75 0.66 0.80 0.92 0.97 0.95    Estimated Creatinine Clearance: 69.2 mL/min (by C-G formula based on SCr of 0.95 mg/dL).    BMP Latest Ref Rng & Units 07/13/2016 07/12/2016 07/11/2016  Glucose 65 - 99 mg/dL 134(H) 93 153(H)  BUN 6 - 20 mg/dL 38(H) 31(H) 21(H)  Creatinine 0.44 - 1.00 mg/dL 0.95 0.97 0.92  Sodium 135 - 145 mmol/L 140 139 140  Potassium 3.5 - 5.1 mmol/L 3.7 4.1 4.1  Chloride 101 - 111 mmol/L 81(L) 82(L) 83(L)  CO2 22 - 32 mmol/L 50(H) 50(H) 50(H)  Calcium 8.9 - 10.3 mg/dL 8.2(L) 8.2(L) 7.9(L)   Magnesium (mg/dL)  Date Value  07/13/2016 2.1  01/04/2013 2.0   Phosphorus (mg/dL)  Date Value  07/13/2016 8.6 (H)   Albumin (g/dL)  Date Value  07/02/2016 3.1 (L)  06/30/2014 3.8     Allergies  Allergen Reactions  . Lyrica [Pregabalin] Other (See Comments)    Reaction: made Restless Leg Syndrome worse     Antimicrobials this admission: 4/20 ceftriaxone >> 4/20 4/21 levofloxacin >>4/21 4/22 Unasyn >> 4/27   Vancomycin 4/27 >>  Zosyn 4/27 >>   Dose adjustments this  admission:  Microbiology results: 4/17 BCx: STAPHYLOCOCCUS CAPITIS 4/18 BX: NG x 3 days  4/17 Sputum: STREPTOCOCCUS PNEUMONIAE 4/17 MRSA PCR: negative  4/19 BCx: negative 4/27 Sputum: no organisms seen  4/27 UCx: pending  4/27 BCx: no growth < 12 hours   Thank you for allowing pharmacy to be a part of this patient's care.  Jalila Goodnough L, 07/13/2016 11:19 PM

## 2016-07-13 NOTE — Progress Notes (Signed)
Daily Progress Note   Patient Name: Hannah Powell       Date: 07/13/2016 DOB: October 03, 1945  Age: 71 y.o. MRN#: [id number] Attending Physician: Fritzi Mandes, MD Primary Care Physician: Adin Hector, MD Admit Date: 07/02/2016  Reason for Consultation/Follow-up: Establishing goals of care  Subjective: Patient intubated and sedated. No signs or symptoms of discomfort.  Husband, daughter, and friend at bedside. Confirmed full scope treatment but not resuscitation. Worried about her fevers this morning. Answered questions. Offered emotional and spiritual support.   Length of Stay: 11  Current Medications: Scheduled Meds:  . aspirin  81 mg Per Tube Daily  . budesonide (PULMICORT) nebulizer solution  0.25 mg Nebulization Q6H  . chlorhexidine  15 mL Mouth Rinse BID  . chlorhexidine gluconate (MEDLINE KIT)  15 mL Mouth Rinse BID  . chlorpheniramine-HYDROcodone  5 mL Oral Q12H  . enoxaparin (LOVENOX) injection  40 mg Subcutaneous Q12H  . feeding supplement (PRO-STAT SUGAR FREE 64)  30 mL Per Tube BID  . feeding supplement (VITAL HIGH PROTEIN)  1,000 mL Per Tube Q24H  . fentaNYL (SUBLIMAZE) injection  50 mcg Intravenous Once  . ipratropium-albuterol  3 mL Nebulization Q6H  . [START ON 07/14/2016] levothyroxine  200 mcg Per Tube QAC breakfast  . mouth rinse  15 mL Mouth Rinse QID  . metolazone  5 mg Per Tube Daily  . pantoprazole sodium  40 mg Per Tube Daily  . potassium & sodium phosphates  2 packet Oral Q4H  . pramipexole  4 mg Per Tube QHS  . senna-docusate  2 tablet Per Tube BID    Continuous Infusions: . ampicillin-sulbactam (UNASYN) IV Stopped (07/13/16 0531)  . fentaNYL infusion INTRAVENOUS 200 mcg/hr (07/13/16 0944)  . norepinephrine (LEVOPHED) Adult infusion 17 mcg/min  (07/13/16 0900)  . potassium phosphate IVPB (mmol) 30 mmol (07/13/16 0742)    PRN Meds: acetaminophen, albuterol, fentaNYL, midazolam, midazolam  Physical Exam  Constitutional: She appears lethargic. She appears ill. She is sedated and intubated.  HENT:  Head: Normocephalic and atraumatic.  Cardiovascular: Regular rhythm.   Pulmonary/Chest: She is intubated. She has decreased breath sounds. She has wheezes.  Abdominal: Normal appearance and bowel sounds are normal.  Musculoskeletal: She exhibits edema (generalized).  Neurological: She appears lethargic.  Arouses to voice/pleasantly confused  Skin: Skin is warm and  dry. There is pallor.  Psychiatric: Cognition and memory are impaired. She is inattentive.  Nursing note and vitals reviewed.          Vital Signs: BP (!) 88/58   Pulse 88   Temp (!) 102.9 F (39.4 C)   Resp 17   Ht _0  (1.651 m)   Wt 110.6 kg (243 lb 13.3 oz)   SpO2 95%   BMI 40.58 kg/m  SpO2: SpO2: 95 % O2 Device: O2 Device: Ventilator O2 Flow Rate: O2 Flow Rate (L/min): 40 L/min  Intake/output summary:   Intake/Output Summary (Last 24 hours) at 07/13/16 1010 Last data filed at 07/13/16 1000  Gross per 24 hour  Intake          2421.37 ml  Output             2200 ml  Net           221.37 ml   LBM: Last BM Date: 07/06/16 Baseline Weight: Weight: 113.4 kg (250 lb) Most recent weight: Weight: 110.6 kg (243 lb 13.3 oz)  Palliative Assessment/Data: PPS 20%   Flowsheet Rows     Most Recent Value  Intake Tab  Referral Department  Critical care  Unit at Time of Referral  ICU  Palliative Care Primary Diagnosis  Pulmonary  Date Notified  07/10/16  Palliative Care Type  New Palliative care  Reason for referral  Clarify Goals of Care  Date of Admission  07/02/16  Date first seen by Palliative Care  07/11/16  # of days Palliative referral response time  1 Day(s)  # of days IP prior to Palliative referral  8  Clinical Assessment  Palliative Performance  Scale Score  20%  Psychosocial & Spiritual Assessment  Palliative Care Outcomes  Patient/Family meeting held?  Yes  Who was at the meeting?  husband, daughter, friend  Palliative Care Outcomes  Clarified goals of care, ACP counseling assistance, Provided psychosocial or spiritual support      Patient Active Problem List   Diagnosis Date Noted  . Community acquired pneumonia of right lung   . Palliative care by specialist   . Goals of care, counseling/discussion   . Pressure injury of skin 07/03/2016  . Acute respiratory failure with hypoxia and hypercapnia ([place]) 07/02/2016  . Acute on chronic diastolic CHF (congestive heart failure) ([place]) 07/21/2015  . COPD exacerbation ([place]) 07/21/2015  . Acute renal insufficiency 07/21/2015  . Hyperglycemia 07/21/2015  . Benign essential HTN 07/21/2015  . Acute on chronic respiratory failure with hypoxia and hypercapnia ([place]) 07/18/2015  . S/P shoulder replacement 07/02/2013    Palliative Care Assessment & Plan   Patient Profile: 71 y.o. female  with past medical history of COPD on home oxygen, sleep apnea with CPAP HS, restless leg, hypertension, hypothyroidism, diastolic CHF, arthritis, and anxiety admitted on 07/02/2016 with respiratory distress and altered mental status. In ED, was in respiratory distress and placed on BiPAP. Found to have large right-sided pneumonia. Patient has acute on chronic hypoxic and hypercapnic respiratory failure complicated by pneumococcal pneumonia and severe COPD. Palliative medicine consultation for goals of care.   Assessment: Acute on chronic hypoxic/hypercarbic respiratory failure Severe COPD Acute diastolic CHF Sepsis with right-sided pneumonia Acute kidney injury due to ATN Sleep apnea on CPAP HS Essential hypertension Hypothyroidism   Recommendations/Plan:  Intubated. Family confirms full scope treatment but no resuscitation.  Watchful waiting through the weekend.   PMT will f/u Monday.   Goals  of Care and Additional Recommendations:  Limitations on Scope of Treatment: Full Scope Treatment no resuscitation  Code Status: DNR   Code Status Orders        Start     Ordered   07/02/16 8832  Do not attempt resuscitation (DNR)  Continuous    Question Answer Comment  In the event of cardiac or respiratory ARREST Do not call a "code blue"   In the event of cardiac or respiratory ARREST Do not perform Intubation, CPR, defibrillation or ACLS   In the event of cardiac or respiratory ARREST Use medication by any route, position, wound care, and other measures to relive pain and suffering. May use oxygen, suction and manual treatment of airway obstruction as needed for comfort.   Comments nurse may pronounce      07/02/16 1244    Code Status History    Date Active Date Inactive Code Status Order ID Comments User Context   07/18/2015  1:24 PM 07/22/2015  3:51 PM Full Code [id number]  Flora Lipps, MD ED   07/02/2013 11:24 AM 07/03/2013  1:35 PM Full Code [id number]  Jenetta Loges, PA-C Inpatient       Prognosis:   Unable to determine guarded with clinical decline yesterday--worsening respiratory status now intubated secondary to pneumonia and severe COPD.   Discharge Planning:  To Be Determined  Care plan was discussed with patient, family, and RN  Thank you for allowing the Palliative Medicine Team to assist in the care of this patient.   Time In: 0945 Time Out: 1010 Total Time 64mn Prolonged Time Billed no      Greater than 50%  of this time was spent counseling and coordinating care related to the above assessment and plan.  MIhor Dow FNP-C Palliative Medicine Team  Phone: [phone]: [phone] Please contact Palliative Medicine Team phone at [phone] questions and concerns.

## 2016-07-13 NOTE — Progress Notes (Signed)
Fever unchanged by ice packs therefore will place cooling blanket and give bath.

## 2016-07-13 NOTE — Progress Notes (Signed)
Patient with fever of 102.3 orally upon assessment. Prn tylenol given as ordered and NP [place] made aware. Orders for cultures (blood/urine/sputum) acknowledged. Levophed and fentanyl drip infusing. Family at bedside last night and updated on plan of care. Will continue to monitor.

## 2016-07-13 NOTE — Progress Notes (Addendum)
Fever unrelieved by tylenol therefore Ice packs placed under both axilla, behind neck and in groin to try to bring fever down.  Will continue to monitor.

## 2016-07-13 NOTE — Progress Notes (Signed)
Since bathing patient temperature is consistantly trending down.  Cooling blanket placed under patient when turning for bath but has not been turned on yet since temperature is coming down.  Will continue to monitor and assess need to turn cooling blanket on.

## 2016-07-14 ENCOUNTER — Inpatient Hospital Stay
Admit: 2016-07-14 | Discharge: 2016-07-14 | Disposition: A | Discharge status: Home / Self Care | Payer: Medicare HMO | Attending: Pulmonary Disease | Admitting: Pulmonary Disease

## 2016-07-14 LAB — BLOOD GAS, ARTERIAL
ACID-BASE EXCESS: 34.1 mmol/L — AB (ref 0.0–2.0)
BICARBONATE: 64.8 mmol/L — AB (ref 20.0–28.0)
FIO2: 40
MECHVT: 500 mL
O2 SAT: 87.7 %
PATIENT TEMPERATURE: 37
PCO2 ART: 87 mmHg — AB (ref 32.0–48.0)
PEEP: 5 cmH2O
PH ART: 7.48 — AB (ref 7.350–7.450)
PO2 ART: 50 mmHg — AB (ref 83.0–108.0)
RATE: 16 resp/min

## 2016-07-14 LAB — BASIC METABOLIC PANEL
Anion gap: 9 (ref 5–15)
BUN: 33 mg/dL — AB (ref 6–20)
CALCIUM: 7.8 mg/dL — AB (ref 8.9–10.3)
CO2: 49 mmol/L — AB (ref 22–32)
CREATININE: 0.79 mg/dL (ref 0.44–1.00)
Chloride: 80 mmol/L — ABNORMAL LOW (ref 101–111)
GFR calc non Af Amer: >60 mL/min (ref >60)
Glucose, Bld: 203 mg/dL — ABNORMAL HIGH (ref 65–99)
Potassium: 3.1 mmol/L — ABNORMAL LOW (ref 3.5–5.1)
Sodium: 138 mmol/L (ref 135–145)

## 2016-07-14 LAB — URINE CULTURE: CULTURE: NO GROWTH

## 2016-07-14 LAB — GLUCOSE, CAPILLARY
GLUCOSE-CAPILLARY: 182 mg/dL — AB (ref 65–99)
GLUCOSE-CAPILLARY: 197 mg/dL — AB (ref 65–99)
GLUCOSE-CAPILLARY: 200 mg/dL — AB (ref 65–99)
Glucose-Capillary: 198 mg/dL — ABNORMAL HIGH (ref 65–99)
Glucose-Capillary: 219 mg/dL — ABNORMAL HIGH (ref 65–99)
Glucose-Capillary: 185 mg/dL — ABNORMAL HIGH (ref 65–99)
Glucose-Capillary: 188 mg/dL — ABNORMAL HIGH (ref 65–99)

## 2016-07-14 LAB — PHOSPHORUS: Phosphorus: 4.2 mg/dL (ref 2.5–4.6)

## 2016-07-14 LAB — BRAIN NATRIURETIC PEPTIDE: B NATRIURETIC PEPTIDE 5: 122 pg/mL — AB (ref 0.0–100.0)

## 2016-07-14 LAB — MAGNESIUM: Magnesium: 2 mg/dL (ref 1.7–2.4)

## 2016-07-14 LAB — TRIGLYCERIDES: TRIGLYCERIDES: 137 mg/dL (ref <150)

## 2016-07-14 LAB — VANCOMYCIN, TROUGH: Vancomycin Tr: 18 ug/mL (ref 15–20)

## 2016-07-14 MED ORDER — POTASSIUM CHLORIDE 20 MEQ/15ML (10%) PO SOLN
30.0000 meq | Freq: Once | ORAL | Status: AC
  Administered 2016-07-14: 30 meq
  Filled 2016-07-14: qty 22.5

## 2016-07-14 MED ORDER — PROPOFOL 1000 MG/100ML IV EMUL
5.0000 ug/kg/min | INTRAVENOUS | Status: DC
  Administered 2016-07-14: 10 ug/kg/min via INTRAVENOUS
  Administered 2016-07-14 – 2016-07-15 (×2): 20 ug/kg/min via INTRAVENOUS
  Administered 2016-07-15: 15 ug/kg/min via INTRAVENOUS
  Administered 2016-07-15 – 2016-07-16 (×2): 20 ug/kg/min via INTRAVENOUS
  Administered 2016-07-16: 50 ug/kg/min via INTRAVENOUS
  Administered 2016-07-16: 40 ug/kg/min via INTRAVENOUS
  Administered 2016-07-16: 20 ug/kg/min via INTRAVENOUS
  Administered 2016-07-16 – 2016-07-17 (×2): 50 ug/kg/min via INTRAVENOUS
  Administered 2016-07-17: 35 ug/kg/min via INTRAVENOUS
  Administered 2016-07-17: 30 ug/kg/min via INTRAVENOUS
  Administered 2016-07-17: 35 ug/kg/min via INTRAVENOUS
  Administered 2016-07-17: 23 ug/kg/min via INTRAVENOUS
  Administered 2016-07-17: 30 ug/kg/min via INTRAVENOUS
  Administered 2016-07-18: 20 ug/kg/min via INTRAVENOUS
  Filled 2016-07-14 (×18): qty 100

## 2016-07-14 MED ORDER — INSULIN ASPART 100 UNIT/ML ~~LOC~~ SOLN
0.0000 [IU] | Freq: Three times a day (TID) | SUBCUTANEOUS | Status: DC
  Administered 2016-07-14 (×2): 4 [IU] via SUBCUTANEOUS
  Administered 2016-07-14: 7 [IU] via SUBCUTANEOUS
  Administered 2016-07-15: 4 [IU] via SUBCUTANEOUS
  Administered 2016-07-15: 7 [IU] via SUBCUTANEOUS
  Administered 2016-07-15: 4 [IU] via SUBCUTANEOUS
  Administered 2016-07-16 (×3): 3 [IU] via SUBCUTANEOUS
  Filled 2016-07-14: qty 4
  Filled 2016-07-14: qty 7
  Filled 2016-07-14 (×2): qty 3
  Filled 2016-07-14: qty 7
  Filled 2016-07-14: qty 4
  Filled 2016-07-14: qty 3
  Filled 2016-07-14 (×2): qty 4

## 2016-07-14 MED ORDER — IBUPROFEN 100 MG/5ML PO SUSP
600.0000 mg | Freq: Once | ORAL | Status: AC
  Administered 2016-07-14: 600 mg
  Filled 2016-07-14: qty 30

## 2016-07-14 NOTE — Progress Notes (Signed)
Pt has sores on her lips and inside of her mouth with scabs. ETAD moved every 4 hrs to help with breakdown.

## 2016-07-14 NOTE — Progress Notes (Signed)
RN spoke with Dr. Lyndel Safe about patient being agitated and anxious not relieved by fentanyl drip at 300 mcg/H.  Versed PRN used but only works for a short time.  MD gave order for propofol drip.

## 2016-07-14 NOTE — Progress Notes (Signed)
Pharmacy Antibiotic Note/ Electrolyte Monitoring  Hannah Powell is a 71 y.o. female admitted on 07/02/2016 with  pneumonia.  Pharmacy has been consulted for vancomycin and Zosyn dosing as well as for electrolyte monitoring and replacement.   Plan: 1. Patient with persistent fevers and patient panned cultured. Coverage expanded and will initiate Zosyn EI 3.375g IV Q8hr.   2. Continue 1000mg  IV Q12hr for goal trough of 15-20. Will obtain trough prior to 5th dose of vancomycin.     3.Will replace potassium 37mEq VT, recheck electrolytes with AM labs  Height: 5\' 5"  (165.1 cm) Weight: 247 lb 2.2 oz (112.1 kg) IBW/kg (Calculated) : 57  Temp (24hrs), Avg:101.8 F (38.8 C), Min:98.6 F (37 C), Max:103.3 F (39.6 C)   Recent Labs Lab 07/09/16 0501 07/10/16 0439 07/11/16 0415 07/12/16 0534 07/13/16 0434 07/14/16 0515  WBC 7.6 7.9  --  8.6 9.2  --   CREATININE 0.66 0.80 0.92 0.97 0.95 0.79  VANCOTROUGH  --   --   --   --   --  18    Estimated Creatinine Clearance: 82.8 mL/min (by C-G formula based on SCr of 0.79 mg/dL).    BMP Latest Ref Rng & Units 07/14/2016 07/13/2016 07/12/2016  Glucose 65 - 99 mg/dL 203(H) 134(H) 93  BUN 6 - 20 mg/dL 33(H) 38(H) 31(H)  Creatinine 0.44 - 1.00 mg/dL 0.79 0.95 0.97  Sodium 135 - 145 mmol/L 138 140 139  Potassium 3.5 - 5.1 mmol/L 3.1(L) 3.7 4.1  Chloride 101 - 111 mmol/L 80(L) 81(L) 82(L)  CO2 22 - 32 mmol/L 49(H) 50(H) 50(H)  Calcium 8.9 - 10.3 mg/dL 7.8(L) 8.2(L) 8.2(L)   Magnesium (mg/dL)  Date Value  07/14/2016 2.0  01/04/2013 2.0   Phosphorus (mg/dL)  Date Value  07/14/2016 4.2   Albumin (g/dL)  Date Value  07/02/2016 3.1 (L)  06/30/2014 3.8     Allergies  Allergen Reactions  . Lyrica [Pregabalin] Other (See Comments)    Reaction: made Restless Leg Syndrome worse     Antimicrobials this admission: 4/20 ceftriaxone >> 4/20 4/21 levofloxacin >>4/21 4/22 Unasyn >> 4/27   Vancomycin 4/27 >>  Zosyn 4/27 >>   Dose  adjustments this admission:  Microbiology results: 4/17 BCx: STAPHYLOCOCCUS CAPITIS 4/18 BX: NG x 3 days  4/17 Sputum: STREPTOCOCCUS PNEUMONIAE 4/17 MRSA PCR: negative  4/19 BCx: negative 4/27 Sputum: no organisms seen  4/27 UCx: pending  4/27 BCx: no growth < 12 hours   Thank you for allowing pharmacy to be a part of this patient's care.  Hans Rusher C, 07/14/2016 8:15 AM

## 2016-07-14 NOTE — Progress Notes (Signed)
PULMONARY / CRITICAL CARE MEDICINE   Name: Hannah Powell MRN: [id number] DOB: 10-02-1945    ADMISSION DATE:  07/02/2016    SIGNIFICANT EVENTS: Noted events overnight. Patient continues to be on norepinephrine. Fever curve better. Reviewed CT scans from overnight.    HISTORY OF PRESENT ILLNESS:   71 y.o.F former smoker pt of pulmonologist Dr. Mortimer Fries admitted 04/16 with acute on chronic hypercapnic hypoxic respiratory failure secondary to AECOPD and right sided pneumonia. Intubated 4/26.  PAST MEDICAL HISTORY :   has a past medical history of Anxiety; Arthritis; CHF (congestive heart failure) ([place]); COPD (chronic obstructive pulmonary disease) ([place]); Hypertension; Hypothyroidism; RLS (restless legs syndrome); Shortness of breath; and Sleep apnea.  has a past surgical history that includes Back surgery; Joint replacement; Foot arthroplasty; Total shoulder arthroplasty (Left, 07/02/2013); and Breast biopsy (Left).    VITAL SIGNS: Temp:  [98.6 F (37 C)-102.9 F (39.4 C)] 100.6 F (38.1 C) (04/28 1500) Pulse Rate:  [66-91] 83 (04/28 1500) Resp:  [12-24] 18 (04/28 1500) BP: (70-182)/(40-135) 103/47 (04/28 1500) SpO2:  [87 %-100 %] 94 % (04/28 1500) FiO2 (%):  [40 %-65 %] 60 % (04/28 1122) Weight:  [247 lb 2.2 oz (112.1 kg)] 247 lb 2.2 oz (112.1 kg) (04/28 0500) HEMODYNAMICS: CVP:  [5 mmHg-13 mmHg] 10 mmHg VENTILATOR SETTINGS: Vent Mode: PRVC FiO2 (%):  [40 %-65 %] 60 % Set Rate:  [16 bmp] 16 bmp Vt Set:  [500 mL] 500 mL PEEP:  [5 cmH20] 5 cmH20 INTAKE / OUTPUT:  Intake/Output Summary (Last 24 hours) at 07/14/16 1526 Last data filed at 07/14/16 1500  Gross per 24 hour  Intake          3484.68 ml  Output             4450 ml  Net          -965.32 ml    PHYSICAL EXAMINATION: General:  Intubated, sedated Neuro:  Moves all extremities to painful stimuli HEENT:  ett+ Cardiovascular:  s1s2+, regular Lungs:  Decreased air entry b/l, no wheezing/crackles Abdomen:  Distended,  decreased bowel sounds Ext: no edema  LABS:  CBC  Recent Labs Lab 07/10/16 0439 07/12/16 0534 07/13/16 0434  WBC 7.9 8.6 9.2  HGB 13.3 12.5 13.1  HCT 40.8 39.2 40.1  PLT 105* 131* 154   Coag's No results for input(s): APTT, INR in the last 168 hours. BMET  Recent Labs Lab 07/12/16 0534 07/13/16 0434 07/14/16 0515  NA 139 140 138  K 4.1 3.7 3.1*  CL 82* 81* 80*  CO2 50* 50* 49*  BUN 31* 38* 33*  CREATININE 0.97 0.95 0.79  GLUCOSE 93 134* 203*   Electrolytes  Recent Labs Lab 07/12/16 0534  07/13/16 0434 07/13/16 1653 07/14/16 0515  CALCIUM 8.2*  --  8.2*  --  7.8*  MG 2.1  < > 1.7 2.1 2.0  PHOS 3.3  < > 1.6* 8.6* 4.2  < > = values in this interval not displayed. Sepsis Markers No results for input(s): LATICACIDVEN, PROCALCITON, O2SATVEN in the last 168 hours. ABG  Recent Labs Lab 07/10/16 0356 07/12/16 1600 07/14/16 0930  PHART 7.37 7.45 7.48*  PCO2ART 94* 89* 87*  PO2ART 79* 66* 50*   Liver Enzymes No results for input(s): AST, ALT, ALKPHOS, BILITOT, ALBUMIN in the last 168 hours. Cardiac Enzymes No results for input(s): TROPONINI, PROBNP in the last 168 hours. Glucose  Recent Labs Lab 07/13/16 1603 07/13/16 1958 07/14/16 0107 07/14/16 0401 07/14/16 0721 07/14/16 1237  GLUCAP 125* 171* 182* 198* 219* 185*    Imaging Ct Chest W Contrast  Result Date: 07/13/2016 CLINICAL DATA:  Acute kidney injury. Pneumonia and sepsis. Respiratory failure. Ventilator support. EXAM: CT CHEST, ABDOMEN, AND PELVIS WITH CONTRAST TECHNIQUE: Multidetector CT imaging of the chest, abdomen and pelvis was performed following the standard protocol during bolus administration of intravenous contrast. CONTRAST:  183mL ISOVUE-300 IOPAMIDOL (ISOVUE-300) INJECTION 61% COMPARISON:  Recent radiography FINDINGS: CT CHEST FINDINGS Cardiovascular: Aortic atherosclerosis. No aneurysm or dissection. Some coronary artery calcification. Mitral valve region calcification. No  pericardial fluid. No definable pulmonary arterial abnormality. Mediastinum/Nodes: Small reactive mediastinal lymph nodes. Lungs/Pleura: Bilateral effusions layering dependently, larger on the right than the left. On the left, there are areas of patchy bronchopneumonia in the left upper lobe. The left lower lobe shows subtotal collapse. The lung not collapsed shows scattered patchy airspace densities. On the right, the upper lobe shows extensive infiltrate in subtotal collapse. The lower lobe shows subtotal collapse and moderate infiltrate. The right middle lobe shows moderate collapse and infiltrate. Musculoskeletal: Ordinary degenerative changes. Old compression fracture T7. Endotracheal tube tip is in the trachea just above the carina. Nasogastric tube enters the abdomen. CT ABDOMEN PELVIS FINDINGS Hepatobiliary: Normal Pancreas: Fatty replacement.  Otherwise normal. Spleen: Normal Adrenals/Urinary Tract: Adrenal glands are normal. No hydronephrosis. Poorly functioning kidneys. Benign appearing 2 cm cyst lateral right kidney. Stomach/Bowel: No acute or significant bowel finding. Vascular/Lymphatic: Aortic atherosclerosis. No aneurysm. IVC is normal. No retroperitoneal mass or adenopathy. Reproductive: Negative Other: No free fluid or air. Musculoskeletal: Old appearing lower thoracic compression fracture. Extensive chronic degenerative changes throughout the lumbar spine. IMPRESSION: Bilateral effusions layering dependently, larger on the right than the left. Associated pulmonary collapse affecting all lobes to a degree with the exception of the left upper lobe. The portions of the lungs that are not collapsed show patchy airspace filling consistent with bronchopneumonia. No significant intraabdominal finding, other than delayed excretion by the kidneys. Electronically Signed   By: Nelson Chimes M.D.   On: 07/13/2016 18:05   Ct Abdomen Pelvis W Contrast  Result Date: 07/13/2016 CLINICAL DATA:  Acute kidney  injury. Pneumonia and sepsis. Respiratory failure. Ventilator support. EXAM: CT CHEST, ABDOMEN, AND PELVIS WITH CONTRAST TECHNIQUE: Multidetector CT imaging of the chest, abdomen and pelvis was performed following the standard protocol during bolus administration of intravenous contrast. CONTRAST:  177mL ISOVUE-300 IOPAMIDOL (ISOVUE-300) INJECTION 61% COMPARISON:  Recent radiography FINDINGS: CT CHEST FINDINGS Cardiovascular: Aortic atherosclerosis. No aneurysm or dissection. Some coronary artery calcification. Mitral valve region calcification. No pericardial fluid. No definable pulmonary arterial abnormality. Mediastinum/Nodes: Small reactive mediastinal lymph nodes. Lungs/Pleura: Bilateral effusions layering dependently, larger on the right than the left. On the left, there are areas of patchy bronchopneumonia in the left upper lobe. The left lower lobe shows subtotal collapse. The lung not collapsed shows scattered patchy airspace densities. On the right, the upper lobe shows extensive infiltrate in subtotal collapse. The lower lobe shows subtotal collapse and moderate infiltrate. The right middle lobe shows moderate collapse and infiltrate. Musculoskeletal: Ordinary degenerative changes. Old compression fracture T7. Endotracheal tube tip is in the trachea just above the carina. Nasogastric tube enters the abdomen. CT ABDOMEN PELVIS FINDINGS Hepatobiliary: Normal Pancreas: Fatty replacement.  Otherwise normal. Spleen: Normal Adrenals/Urinary Tract: Adrenal glands are normal. No hydronephrosis. Poorly functioning kidneys. Benign appearing 2 cm cyst lateral right kidney. Stomach/Bowel: No acute or significant bowel finding. Vascular/Lymphatic: Aortic atherosclerosis. No aneurysm. IVC is normal. No retroperitoneal mass or adenopathy. Reproductive:  Negative Other: No free fluid or air. Musculoskeletal: Old appearing lower thoracic compression fracture. Extensive chronic degenerative changes throughout the lumbar  spine. IMPRESSION: Bilateral effusions layering dependently, larger on the right than the left. Associated pulmonary collapse affecting all lobes to a degree with the exception of the left upper lobe. The portions of the lungs that are not collapsed show patchy airspace filling consistent with bronchopneumonia. No significant intraabdominal finding, other than delayed excretion by the kidneys. Electronically Signed   By: Nelson Chimes M.D.   On: 07/13/2016 18:05    ASSESSMENT: ACUTE HYPOXIC RESP FAILURE / ARDS SHOCK ? 2 TO SEPSIS PNEUMONIA/ BILATERAL PLEURAL EFFUSIONS ALTERED MENTAL STATUS THROMBOCTOPENIA   PLAN Wean vasopressors for MAP>65. Continue zosyn/vanc. Echo pending. No indication for thoracentesis at this time. Weaning FiO2/PEEP for sats >90%.  Monitor closely. Continue stress dose steroids.  DVT/GI prophylaxis. Glycemic control. DNR. Family updated at bedside. No beds available at [facility]. Called hospital as per family request.  Cc: 35 minutes   Dimas Chyle MD Pulmonary and Critical Care Medicine 07/14/2016, 3:26 PM

## 2016-07-14 NOTE — Progress Notes (Signed)
Pt continues to have sores on both upper and lower lips. Some of the sores are scabbed over and others are bleeding. Ett continues to be moved per protocol every 4 hours

## 2016-07-14 NOTE — Progress Notes (Signed)
[facility] [place] NAME: Hannah Powell    MR#:  [id number]  DATE OF BIRTH:  Mar 08, 1946  SUBJECTIVE:  Patient got intubated on 07/12/16. Spiking fever of 101-103 Yesterday. Off cooling like it. Temperature 90.9  daughter in the room  Appears critically ill REVIEW OF SYSTEMS:   Review of Systems  Unable to perform ROS: Intubated   DRUG ALLERGIES:   Allergies  Allergen Reactions  . Lyrica [Pregabalin] Other (See Comments)    Reaction: made Restless Leg Syndrome worse     VITALS:  Blood pressure (!) 114/56, pulse 66, temperature 99.5 F (37.5 C), temperature source Rectal, resp. rate 16, height '5\' 5"'  (1.651 m), weight 112.1 kg (247 lb 2.2 oz), SpO2 96 %.  PHYSICAL EXAMINATION:   Physical Exam  GENERAL:  71 y.o.-year-old patient lying in the bed with moderate  distress. ObeseCritically ill  EYES: Pupils equal, round, reactive to light and accommodation. No scleral icterus. Extraocular muscles intact.  HEENT: Head atraumatic, normocephalic. Oropharynx and nasopharynx clear.Intubated and on the ventK:  Supple, no jugular venous distention. No thyroid enlargement, no tenderness.  LUNGS:distant breath sounds bilaterally, no wheezing,++ rales,no rhonchi. Shallow breathing CARDIOVASCULAR: S1, S2 normal. No murmurs, rubs, or gallops.  ABDOMEN: Soft, nontender, nondistended. Bowel sounds present. No organomegaly or mass.  EXTREMITIES: No cyanosis, clubbing or edema b/l.    NEUROLOGIC:Intubated Psychiatric: Intubated  SKIN: No obvious rash, lesion, or ulcer.   LABORATORY PANEL:  CBC  Recent Labs Lab 07/13/16 0434  WBC 9.2  HGB 13.1  HCT 40.1  PLT 154    Chemistries   Recent Labs Lab 07/14/16 0515  NA 138  K 3.1*  CL 80*  CO2 49*  GLUCOSE 203*  BUN 33*  CREATININE 0.79  CALCIUM 7.8*  MG 2.0   Cardiac Enzymes No results for input(s): TROPONINI in the last 168 hours. RADIOLOGY:  Dg Abd 1 View  Result Date:  07/12/2016 CLINICAL DATA:  Post endotracheal tube placement, OG tube placement EXAM: ABDOMEN - 1 VIEW COMPARISON:  Portable chest x-ray of 07/10/2016 FINDINGS: The tip of the endotracheal tube is approximately 4.0 cm above the carina. Left IJ central venous line tip overlies the mid lower SVC. A NG tube extends into the stomach. The lungs appear better aerated. Bibasilar opacities remain consistent with atelectasis and possible small effusions. Pleuroparenchymal opacities are unchanged and the right lung apex. Technically the film is overpenetrated making assessment of the left lung difficult. IMPRESSION: 1. Endotracheal tube tip 4.0 cm above the carina. 2. NG tube extends into the stomach. 3. Left central venous line tip overlies the mid lower SVC Electronically Signed   By: Ivar Drape M.D.   On: 07/12/2016 16:03   Ct Chest W Contrast  Result Date: 07/13/2016 CLINICAL DATA:  Acute kidney injury. Pneumonia and sepsis. Respiratory failure. Ventilator support. EXAM: CT CHEST, ABDOMEN, AND PELVIS WITH CONTRAST TECHNIQUE: Multidetector CT imaging of the chest, abdomen and pelvis was performed following the standard protocol during bolus administration of intravenous contrast. CONTRAST:  129m ISOVUE-300 IOPAMIDOL (ISOVUE-300) INJECTION 61% COMPARISON:  Recent radiography FINDINGS: CT CHEST FINDINGS Cardiovascular: Aortic atherosclerosis. No aneurysm or dissection. Some coronary artery calcification. Mitral valve region calcification. No pericardial fluid. No definable pulmonary arterial abnormality. Mediastinum/Nodes: Small reactive mediastinal lymph nodes. Lungs/Pleura: Bilateral effusions layering dependently, larger on the right than the left. On the left, there are areas of patchy bronchopneumonia in the left upper lobe. The left lower lobe shows subtotal collapse.  The lung not collapsed shows scattered patchy airspace densities. On the right, the upper lobe shows extensive infiltrate in subtotal collapse. The  lower lobe shows subtotal collapse and moderate infiltrate. The right middle lobe shows moderate collapse and infiltrate. Musculoskeletal: Ordinary degenerative changes. Old compression fracture T7. Endotracheal tube tip is in the trachea just above the carina. Nasogastric tube enters the abdomen. CT ABDOMEN PELVIS FINDINGS Hepatobiliary: Normal Pancreas: Fatty replacement.  Otherwise normal. Spleen: Normal Adrenals/Urinary Tract: Adrenal glands are normal. No hydronephrosis. Poorly functioning kidneys. Benign appearing 2 cm cyst lateral right kidney. Stomach/Bowel: No acute or significant bowel finding. Vascular/Lymphatic: Aortic atherosclerosis. No aneurysm. IVC is normal. No retroperitoneal mass or adenopathy. Reproductive: Negative Other: No free fluid or air. Musculoskeletal: Old appearing lower thoracic compression fracture. Extensive chronic degenerative changes throughout the lumbar spine. IMPRESSION: Bilateral effusions layering dependently, larger on the right than the left. Associated pulmonary collapse affecting all lobes to a degree with the exception of the left upper lobe. The portions of the lungs that are not collapsed show patchy airspace filling consistent with bronchopneumonia. No significant intraabdominal finding, other than delayed excretion by the kidneys. Electronically Signed   By: Nelson Chimes M.D.   On: 07/13/2016 18:05   Ct Abdomen Pelvis W Contrast  Result Date: 07/13/2016 CLINICAL DATA:  Acute kidney injury. Pneumonia and sepsis. Respiratory failure. Ventilator support. EXAM: CT CHEST, ABDOMEN, AND PELVIS WITH CONTRAST TECHNIQUE: Multidetector CT imaging of the chest, abdomen and pelvis was performed following the standard protocol during bolus administration of intravenous contrast. CONTRAST:  163m ISOVUE-300 IOPAMIDOL (ISOVUE-300) INJECTION 61% COMPARISON:  Recent radiography FINDINGS: CT CHEST FINDINGS Cardiovascular: Aortic atherosclerosis. No aneurysm or dissection. Some  coronary artery calcification. Mitral valve region calcification. No pericardial fluid. No definable pulmonary arterial abnormality. Mediastinum/Nodes: Small reactive mediastinal lymph nodes. Lungs/Pleura: Bilateral effusions layering dependently, larger on the right than the left. On the left, there are areas of patchy bronchopneumonia in the left upper lobe. The left lower lobe shows subtotal collapse. The lung not collapsed shows scattered patchy airspace densities. On the right, the upper lobe shows extensive infiltrate in subtotal collapse. The lower lobe shows subtotal collapse and moderate infiltrate. The right middle lobe shows moderate collapse and infiltrate. Musculoskeletal: Ordinary degenerative changes. Old compression fracture T7. Endotracheal tube tip is in the trachea just above the carina. Nasogastric tube enters the abdomen. CT ABDOMEN PELVIS FINDINGS Hepatobiliary: Normal Pancreas: Fatty replacement.  Otherwise normal. Spleen: Normal Adrenals/Urinary Tract: Adrenal glands are normal. No hydronephrosis. Poorly functioning kidneys. Benign appearing 2 cm cyst lateral right kidney. Stomach/Bowel: No acute or significant bowel finding. Vascular/Lymphatic: Aortic atherosclerosis. No aneurysm. IVC is normal. No retroperitoneal mass or adenopathy. Reproductive: Negative Other: No free fluid or air. Musculoskeletal: Old appearing lower thoracic compression fracture. Extensive chronic degenerative changes throughout the lumbar spine. IMPRESSION: Bilateral effusions layering dependently, larger on the right than the left. Associated pulmonary collapse affecting all lobes to a degree with the exception of the left upper lobe. The portions of the lungs that are not collapsed show patchy airspace filling consistent with bronchopneumonia. No significant intraabdominal finding, other than delayed excretion by the kidneys. Electronically Signed   By: MNelson ChimesM.D.   On: 07/13/2016 18:05   Dg Chest Port 1  View  Result Date: 07/13/2016 CLINICAL DATA:  71year old female with acute respiratory failure. EXAM: PORTABLE CHEST 1 VIEW COMPARISON:  Chest radiograph dated 07/12/2016 FINDINGS: Endotracheal tube above the carina in similar positioning. Left IJ central line in stable position.  Enteric tube extends into the left hemiabdomen with tip beyond the inferior margin of the image. There is mild cardiomegaly. There is prominence of the central pulmonary arteries likely representing a degree of underlying pulmonary hypertension. Bibasilar and right upper lobe airspace densities again noted, similar or slightly worsened. Small bilateral pleural effusions. No pneumothorax. Partially visualized lower cervical fusion hardware. Left shoulder hemiarthroplasty. No acute fracture. IMPRESSION: 1. Bibasilar and right upper lobe airspace opacities, similar or slightly worsened compared to the prior radiograph. 2. Small bilateral pleural effusions similar to prior radiograph. Electronically Signed   By: Anner Crete M.D.   On: 07/13/2016 06:52   Dg Chest Port 1 View  Result Date: 07/12/2016 CLINICAL DATA:  Endotracheal tube placement. EXAM: PORTABLE CHEST 1 VIEW COMPARISON:  Chest radiograph July 10, 2016 FINDINGS: Endotracheal tube tip projects 3.5 cm above the carina. LEFT internal jugular central venous catheter distal tip projects in proximal superior vena cava. Nasogastric tube side port projecting in proximal stomach, distal tip out of field-of-view. Increasing bibasilar airspace opacities, obscuring the hemidiaphragms. Cardiac silhouette is mildly enlarged, mildly calcified aortic knob. Improved RIGHT upper lobe consolidation with persistent interstitial prominence. Small pleural effusions. No pneumothorax. LEFT humeral arthroplasty, osseous structures unchanged. ACDF. IMPRESSION: Endotracheal tube tip projects 3.5 cm above the carina. LEFT internal jugular central venous catheter distal tip projects in proximal  superior vena cava. Nasogastric tube side port projecting in proximal stomach, distal tip out of field-of-view. Worsening bibasilar consolidation with improved aeration of RIGHT upper lobe, findings suggest shifting pulmonary edema, pneumonia is possible. Small pleural effusions. Electronically Signed   By: Elon Alas M.D.   On: 07/12/2016 16:02   ASSESSMENT AND PLAN:  Hannah Powell a 71 y.o.femalepresented with respiratory distress. She stated on Thursday she started feeling bad. She needed to increase her oxygen from 2 L to 3 L. On Saturday she had shaking chills and fever.   1. Acute on chronic hypoxic/hypercarbic respiratory failure with hypoxia and acidosis.  -Appears combination of severe COPD and CHF acute diastolic (Echo in the past EF 65%) -Patient continued to worsen despite aggressive medical management. -family had discussion with DR Mortimer Fries. -Patient intubated on  (07/12/16) -IV fentanyl dripAdded low-dose propofol -Currently on Levophed -Spiking high-grade fever yesterday. -Repeat cultures including blood cultures sputum culture no growth from 07/13/2016  2. Clinical sepsis with Right-sided pneumonia, leukocytosis and tachypnea. IV Vanco and Zosyn  - repeat blood cultures from 4/27 negative  - sputum cultures negative  3. Acute kidney injury due to ATN from pneumonia and sepsis. -creat improving Baseline creat 0.75 in 01/2016 Came in with 2.53--- 2.08--1.10--0.75  4. Acute on chronic COPD on chronic oxygen. -management as above  5.History of obstructive sleep apnea   6.  history of Essential hypertension patient is hypotensive on IV levophed  7. Hypothyroidism    8. Palliative care has seen and met patient and family. -Patient remains DO NOT RESUSCITATE  Overall critically ill  Case discussed with Care Management/Social Worker. Management plans discussed with the patient, family and they are in agreement.  CODE STATUS: DNR  DVT Prophylaxis:  lovenox  TOTAL TIME TAKING CARE OF THIS PATIENT: 30 minutes.  >50% time spent on counselling and coordination of careHusband and daughter   Note: This dictation was prepared with Dragon dictation along with smaller phrase technology. Any transcriptional errors that result from this process are unintentional.  Brinkley Peet M.D on 07/14/2016 at 11:33 AM  Between 7am to 6pm - Pager - [phone]  After 6pm go  to [email or web address] - password [email or web address] [facility]  Office  [phone]  CC: Primary care physician; Adin Hector, MD

## 2016-07-15 ENCOUNTER — Inpatient Hospital Stay: Payer: Medicare HMO

## 2016-07-15 LAB — BLOOD GAS, ARTERIAL
ACID-BASE EXCESS: 30.2 mmol/L — AB (ref 0.0–2.0)
Acid-Base Excess: 28.6 mmol/L — ABNORMAL HIGH (ref 0.0–2.0)
Bicarbonate: 61.8 mmol/L — ABNORMAL HIGH (ref 20.0–28.0)
Bicarbonate: 58 mmol/L — ABNORMAL HIGH (ref 20.0–28.0)
FIO2: 0.6
FIO2: 60
MECHVT: 500 mL
MECHVT: 500 mL
Mechanical Rate: 16
O2 SAT: 97.2 %
O2 SAT: 94.9 %
PATIENT TEMPERATURE: 37
PCO2 ART: 91 mmHg — AB (ref 32.0–48.0)
PCO2 ART: 71 mmHg — AB (ref 32.0–48.0)
PEEP: 5 cmH2O
PEEP: 5 cmH2O
PH ART: 7.44 (ref 7.350–7.450)
PH ART: 7.52 — AB (ref 7.350–7.450)
PO2 ART: 90 mmHg (ref 83.0–108.0)
Patient temperature: 37
RATE: 25 resp/min
pO2, Arterial: 67 mmHg — ABNORMAL LOW (ref 83.0–108.0)

## 2016-07-15 LAB — BASIC METABOLIC PANEL
ANION GAP: 8 (ref 5–15)
BUN: 31 mg/dL — AB (ref 6–20)
CALCIUM: 7.7 mg/dL — AB (ref 8.9–10.3)
CO2: 48 mmol/L — ABNORMAL HIGH (ref 22–32)
CREATININE: 0.68 mg/dL (ref 0.44–1.00)
Chloride: 80 mmol/L — ABNORMAL LOW (ref 101–111)
GFR calc Af Amer: >60 mL/min (ref >60)
GFR calc non Af Amer: >60 mL/min (ref >60)
GLUCOSE: 267 mg/dL — AB (ref 65–99)
Potassium: 2.4 mmol/L — CL (ref 3.5–5.1)
Sodium: 136 mmol/L (ref 135–145)

## 2016-07-15 LAB — ECHOCARDIOGRAM COMPLETE
HEIGHTINCHES: 65 in
WEIGHTICAEL: 3954.17 [oz_av]

## 2016-07-15 LAB — MAGNESIUM: MAGNESIUM: 2 mg/dL (ref 1.7–2.4)

## 2016-07-15 LAB — GLUCOSE, CAPILLARY
GLUCOSE-CAPILLARY: 137 mg/dL — AB (ref 65–99)
GLUCOSE-CAPILLARY: 217 mg/dL — AB (ref 65–99)
Glucose-Capillary: 221 mg/dL — ABNORMAL HIGH (ref 65–99)
Glucose-Capillary: 167 mg/dL — ABNORMAL HIGH (ref 65–99)
Glucose-Capillary: 158 mg/dL — ABNORMAL HIGH (ref 65–99)
Glucose-Capillary: 144 mg/dL — ABNORMAL HIGH (ref 65–99)

## 2016-07-15 LAB — POTASSIUM: POTASSIUM: 2.8 mmol/L — AB (ref 3.5–5.1)

## 2016-07-15 LAB — VANCOMYCIN, TROUGH: VANCOMYCIN TR: 26 ug/mL — AB (ref 15–20)

## 2016-07-15 MED ORDER — SODIUM CHLORIDE 0.9 % IV SOLN
30.0000 meq | Freq: Once | INTRAVENOUS | Status: AC
  Administered 2016-07-15: 30 meq via INTRAVENOUS
  Filled 2016-07-15: qty 15

## 2016-07-15 MED ORDER — POTASSIUM CHLORIDE 20 MEQ/15ML (10%) PO SOLN
40.0000 meq | Freq: Once | ORAL | Status: AC
  Administered 2016-07-15: 40 meq
  Filled 2016-07-15: qty 30

## 2016-07-15 MED ORDER — FUROSEMIDE 10 MG/ML IJ SOLN
20.0000 mg | Freq: Once | INTRAMUSCULAR | Status: AC
Start: 2016-07-15 — End: 2016-07-15
  Administered 2016-07-15: 20 mg via INTRAVENOUS
  Filled 2016-07-15: qty 2

## 2016-07-15 MED ORDER — ENOXAPARIN SODIUM 40 MG/0.4ML ~~LOC~~ SOLN
40.0000 mg | SUBCUTANEOUS | Status: DC
  Administered 2016-07-15 – 2016-07-23 (×9): 40 mg via SUBCUTANEOUS
  Filled 2016-07-15 (×9): qty 0.4

## 2016-07-15 MED ORDER — VANCOMYCIN HCL IN DEXTROSE 1-5 GM/200ML-% IV SOLN
1000.0000 mg | INTRAVENOUS | Status: DC
  Administered 2016-07-16 – 2016-07-17 (×2): 1000 mg via INTRAVENOUS
  Filled 2016-07-15 (×3): qty 200

## 2016-07-15 NOTE — Progress Notes (Signed)
Pt has been awake on and off this shift. Had to be medicated with Fentanyl bolus and titrated continuous gtt. Blood pressure has been up and down this shift as well. Continue on Levo. Potassium was down this AM and medicated with K gtt and per peg tube. CO2 was elevated at 91. RT changed vent setting to accommodate.

## 2016-07-15 NOTE — Progress Notes (Addendum)
[facility] [place] NAME: Hannah Powell    MR#:  [id number]  DATE OF BIRTH:  02-21-1946  SUBJECTIVE:  Patient got intubated on 07/12/16.  Off cooling blanket, fever curve better  daughter in the room  Appears critically ill IV propofol, fentanyl and weaning IV levophed REVIEW OF SYSTEMS:   Review of Systems  Unable to perform ROS: Intubated   DRUG ALLERGIES:   Allergies  Allergen Reactions  . Lyrica [Pregabalin] Other (See Comments)    Reaction: made Restless Leg Syndrome worse     VITALS:  Blood pressure (!) 95/58, pulse 83, temperature 99.2 F (37.3 C), resp. rate 17, height _0  (1.651 m), weight 113.1 kg (249 lb 5.4 oz), SpO2 100 %.  PHYSICAL EXAMINATION:   Physical Exam  GENERAL:  71 y.o.-year-old patient lying in the bed with moderate  distress. ObeseCritically ill  EYES: Pupils equal, round, reactive to light and accommodation. No scleral icterus. Extraocular muscles intact.  HEENT: Head atraumatic, normocephalic. Oropharynx and nasopharynx clear.Intubated and on the ventK:  Supple, no jugular venous distention. No thyroid enlargement, no tenderness.  LUNGS:distant breath sounds bilaterally, no wheezing,++ rales,no rhonchi. Shallow breathing CARDIOVASCULAR: S1, S2 normal. No murmurs, rubs, or gallops.  ABDOMEN: Soft, nontender, nondistended. Bowel sounds present. No organomegaly or mass.  EXTREMITIES: No cyanosis, clubbing  ++ edema b/l.    NEUROLOGIC:Intubated Psychiatric: Intubated  SKIN: No obvious rash, lesion, or ulcer.   LABORATORY PANEL:  CBC  Recent Labs Lab 07/13/16 0434  WBC 9.2  HGB 13.1  HCT 40.1  PLT 154    Chemistries   Recent Labs Lab 07/14/16 0515 07/15/16 0422  NA 138 136  K 3.1* 2.4*  CL 80* 80*  CO2 49* 48*  GLUCOSE 203* 267*  BUN 33* 31*  CREATININE 0.79 0.68  CALCIUM 7.8* 7.7*  MG 2.0  --    Cardiac Enzymes No results for input(s): TROPONINI in the last 168  hours. RADIOLOGY:  Ct Chest W Contrast  Result Date: 07/13/2016 CLINICAL DATA:  Acute kidney injury. Pneumonia and sepsis. Respiratory failure. Ventilator support. EXAM: CT CHEST, ABDOMEN, AND PELVIS WITH CONTRAST TECHNIQUE: Multidetector CT imaging of the chest, abdomen and pelvis was performed following the standard protocol during bolus administration of intravenous contrast. CONTRAST:  151m ISOVUE-300 IOPAMIDOL (ISOVUE-300) INJECTION 61% COMPARISON:  Recent radiography FINDINGS: CT CHEST FINDINGS Cardiovascular: Aortic atherosclerosis. No aneurysm or dissection. Some coronary artery calcification. Mitral valve region calcification. No pericardial fluid. No definable pulmonary arterial abnormality. Mediastinum/Nodes: Small reactive mediastinal lymph nodes. Lungs/Pleura: Bilateral effusions layering dependently, larger on the right than the left. On the left, there are areas of patchy bronchopneumonia in the left upper lobe. The left lower lobe shows subtotal collapse. The lung not collapsed shows scattered patchy airspace densities. On the right, the upper lobe shows extensive infiltrate in subtotal collapse. The lower lobe shows subtotal collapse and moderate infiltrate. The right middle lobe shows moderate collapse and infiltrate. Musculoskeletal: Ordinary degenerative changes. Old compression fracture T7. Endotracheal tube tip is in the trachea just above the carina. Nasogastric tube enters the abdomen. CT ABDOMEN PELVIS FINDINGS Hepatobiliary: Normal Pancreas: Fatty replacement.  Otherwise normal. Spleen: Normal Adrenals/Urinary Tract: Adrenal glands are normal. No hydronephrosis. Poorly functioning kidneys. Benign appearing 2 cm cyst lateral right kidney. Stomach/Bowel: No acute or significant bowel finding. Vascular/Lymphatic: Aortic atherosclerosis. No aneurysm. IVC is normal. No retroperitoneal mass or adenopathy. Reproductive: Negative Other: No free fluid or air. Musculoskeletal: Old appearing  lower thoracic compression fracture. Extensive chronic degenerative changes throughout the lumbar spine. IMPRESSION: Bilateral effusions layering dependently, larger on the right than the left. Associated pulmonary collapse affecting all lobes to a degree with the exception of the left upper lobe. The portions of the lungs that are not collapsed show patchy airspace filling consistent with bronchopneumonia. No significant intraabdominal finding, other than delayed excretion by the kidneys. Electronically Signed   By: Nelson Chimes M.D.   On: 07/13/2016 18:05   Ct Abdomen Pelvis W Contrast  Result Date: 07/13/2016 CLINICAL DATA:  Acute kidney injury. Pneumonia and sepsis. Respiratory failure. Ventilator support. EXAM: CT CHEST, ABDOMEN, AND PELVIS WITH CONTRAST TECHNIQUE: Multidetector CT imaging of the chest, abdomen and pelvis was performed following the standard protocol during bolus administration of intravenous contrast. CONTRAST:  177m ISOVUE-300 IOPAMIDOL (ISOVUE-300) INJECTION 61% COMPARISON:  Recent radiography FINDINGS: CT CHEST FINDINGS Cardiovascular: Aortic atherosclerosis. No aneurysm or dissection. Some coronary artery calcification. Mitral valve region calcification. No pericardial fluid. No definable pulmonary arterial abnormality. Mediastinum/Nodes: Small reactive mediastinal lymph nodes. Lungs/Pleura: Bilateral effusions layering dependently, larger on the right than the left. On the left, there are areas of patchy bronchopneumonia in the left upper lobe. The left lower lobe shows subtotal collapse. The lung not collapsed shows scattered patchy airspace densities. On the right, the upper lobe shows extensive infiltrate in subtotal collapse. The lower lobe shows subtotal collapse and moderate infiltrate. The right middle lobe shows moderate collapse and infiltrate. Musculoskeletal: Ordinary degenerative changes. Old compression fracture T7. Endotracheal tube tip is in the trachea just above the  carina. Nasogastric tube enters the abdomen. CT ABDOMEN PELVIS FINDINGS Hepatobiliary: Normal Pancreas: Fatty replacement.  Otherwise normal. Spleen: Normal Adrenals/Urinary Tract: Adrenal glands are normal. No hydronephrosis. Poorly functioning kidneys. Benign appearing 2 cm cyst lateral right kidney. Stomach/Bowel: No acute or significant bowel finding. Vascular/Lymphatic: Aortic atherosclerosis. No aneurysm. IVC is normal. No retroperitoneal mass or adenopathy. Reproductive: Negative Other: No free fluid or air. Musculoskeletal: Old appearing lower thoracic compression fracture. Extensive chronic degenerative changes throughout the lumbar spine. IMPRESSION: Bilateral effusions layering dependently, larger on the right than the left. Associated pulmonary collapse affecting all lobes to a degree with the exception of the left upper lobe. The portions of the lungs that are not collapsed show patchy airspace filling consistent with bronchopneumonia. No significant intraabdominal finding, other than delayed excretion by the kidneys. Electronically Signed   By: MNelson ChimesM.D.   On: 07/13/2016 18:05   Dg Chest Port 1 View  Result Date: 07/15/2016 CLINICAL DATA:  Followup for pulmonary infiltrates. 71y.o. Former smoker admitted 04/16 with acute on chronic hypercapnic hypoxic respiratory failure secondary to exacerbation of COPD and right sided pneumonia. Intubated 4/26. EXAM: PORTABLE CHEST 1 VIEW COMPARISON:  07/13/2016 FINDINGS: Patchy bilateral airspace opacities, noted throughout the central right lung and in both lung bases, has improved, although significant consolidation persists. The bilateral pleural effusions appear decreased in size. There are no new lung abnormalities. No pneumothorax. The endotracheal tube, left internal jugular central venous line and nasogastric tube are stable. IMPRESSION: Improved lung aeration when compared the prior exam consistent with improving pneumonia and a decrease in  bilateral pleural effusions. Electronically Signed   By: DLajean ManesM.D.   On: 07/15/2016 10:14   ASSESSMENT AND PLAN:  [name] a 71y.o.femalepresented with respiratory distress. She stated on Thursday she started feeling bad. She needed to increase her oxygen from 2 L to 3 L. On Saturday she had shaking  chills and fever.   1. Acute on chronic hypoxic/hypercarbic respiratory failure with hypoxia and acidosis.  -Appears combination of severe COPD and CHF acute diastolic (Echo in the past EF 65%) -Patient continued to worsen despite aggressive medical management. -family had discussion with DR Mortimer Fries. -Patient intubated on  (07/12/16) -IV fentanyl drip, propofol gtt -Currently on Levophed -fever curve better -Repeat cultures including blood cultures sputum culture no growth from 07/13/2016 -UOP (-) 6.2 liters (not on lasix)  2. Clinical sepsis with Right-sided pneumonia, leukocytosis and tachypnea. IV Vanco and Zosyn  - repeat blood cultures from 4/27 negative  - sputum cultures negative -on IV solucortef  3. Acute kidney injury due to ATN from pneumonia and sepsis. -creat improving Baseline creat 0.75 in 01/2016 Came in with 2.53--- 2.08--1.10--0.75  4. Acute on chronic COPD on chronic oxygen. -management as above  5.History of obstructive sleep apnea   6.  history of Essential hypertension patient is hypotensive on IV levophed  7. Hypothyroidism    8. Palliative care has seen and met patient and family. -Patient remains DO NOT RESUSCITATE  Overall critically ill  Case discussed with Care Management/Social Worker. Management plans discussed with the patient, family and they are in agreement.  CODE STATUS: DNR  DVT Prophylaxis: lovenox  TOTAL TIME TAKING CARE OF THIS PATIENT: 30 minutes.  >50% time spent on counselling and coordination of careHusband and daughter   Note: This dictation was prepared with Dragon dictation along with smaller phrase  technology. Any transcriptional errors that result from this process are unintentional.  Maija Biggers M.D on 07/15/2016 at 1:06 PM  Between 7am to 6pm - Pager - [phone]  After 6pm go to [email or web address] - password [email or web address] [facility]  Office  [phone]  CC: Primary care physician; Adin Hector, MD

## 2016-07-15 NOTE — Progress Notes (Signed)
Pharmacy Antibiotic Note/ Electrolyte Monitoring  Hannah Powell is a 71 y.o. female admitted on 07/02/2016 with  pneumonia.  Pharmacy has been consulted for vancomycin and Zosyn dosing as well as for electrolyte monitoring and replacement.   Plan: 1. Patient with persistent fevers and patient panned cultured. Coverage expanded and will initiate Zosyn EI 3.375g IV Q8hr.   2. Currently ordered Vancomycin 1000mg  IV Q12hr for goal trough of 15-20. 4/29 17:17 Vancomycin trough resulted at 25.6 mcg/ml, this may have been drawn after dose was hung (17:00). Will change to 1g IV q18h and check a Vancomycin random level with AM labs (~12h post dose). If level still elevated then will continue with q18h, if therapeutic then could switch back to q12h interval.  3.Potassium low at 2.8 at 13:00, despite replacement with KCl 103mEq IV and 22mEq PO. Will order KCl 32mEq IV once. Will recheck BMET with AM labs.  Height: 5\' 5"  (165.1 cm) Weight: 249 lb 5.4 oz (113.1 kg) IBW/kg (Calculated) : 57  Temp (24hrs), Avg:100 F (37.8 C), Min:98.8 F (37.1 C), Max:101.8 F (38.8 C)   Recent Labs Lab 07/09/16 0501 07/10/16 0439 07/11/16 0415 07/12/16 0534 07/13/16 0434 07/14/16 0515 07/15/16 0422 07/15/16 1717  WBC 7.6 7.9  --  8.6 9.2  --   --   --   CREATININE 0.66 0.80 0.92 0.97 0.95 0.79 0.68  --   VANCOTROUGH  --   --   --   --   --  18  --  26*    Estimated Creatinine Clearance: 83.2 mL/min (by C-G formula based on SCr of 0.68 mg/dL).    BMP Latest Ref Rng & Units 07/15/2016 07/15/2016 07/14/2016  Glucose 65 - 99 mg/dL - 267(H) 203(H)  BUN 6 - 20 mg/dL - 31(H) 33(H)  Creatinine 0.44 - 1.00 mg/dL - 0.68 0.79  Sodium 135 - 145 mmol/L - 136 138  Potassium 3.5 - 5.1 mmol/L 2.8(L) 2.4(LL) 3.1(L)  Chloride 101 - 111 mmol/L - 80(L) 80(L)  CO2 22 - 32 mmol/L - 48(H) 49(H)  Calcium 8.9 - 10.3 mg/dL - 7.7(L) 7.8(L)   Magnesium (mg/dL)  Date Value  07/15/2016 2.0  01/04/2013 2.0   Phosphorus  (mg/dL)  Date Value  07/14/2016 4.2   Albumin (g/dL)  Date Value  07/02/2016 3.1 (L)  06/30/2014 3.8     Allergies  Allergen Reactions  . Lyrica [Pregabalin] Other (See Comments)    Reaction: made Restless Leg Syndrome worse     Antimicrobials this admission: 4/20 ceftriaxone >> 4/20 4/21 levofloxacin >>4/21 4/22 Unasyn >> 4/27   Vancomycin 4/27 >>  Zosyn 4/27 >>   Dose adjustments this admission:  Microbiology results: 4/17 BCx: STAPHYLOCOCCUS CAPITIS 4/18 BX: NG x 3 days  4/17 Sputum: STREPTOCOCCUS PNEUMONIAE 4/17 MRSA PCR: negative  4/19 BCx: negative 4/27 Sputum: no organisms seen  4/27 UCx: pending  4/27 BCx: no growth < 12 hours   Thank you for allowing pharmacy to be a part of this patient's care.  Paulina Fusi, PharmD, BCPS 07/15/2016 6:44 PM

## 2016-07-15 NOTE — Progress Notes (Signed)
Pharmacy Antibiotic Note/ Electrolyte Monitoring  Hannah Powell is a 71 y.o. female admitted on 07/02/2016 with  pneumonia.  Pharmacy has been consulted for vancomycin and Zosyn dosing as well as for electrolyte monitoring and replacement.   Plan: 1. Patient with persistent fevers and patient panned cultured. Coverage expanded and will initiate Zosyn EI 3.375g IV Q8hr.   2. Continue 1000mg  IV Q12hr for goal trough of 15-20. Will obtain trough prior to 5th dose of vancomycin.     3.Potassium critically low this AM, replaced with KCl 58mEq IV and 29mEq PO. Will recheck K/Mg at 1300.  Height: 5\' 5"  (165.1 cm) Weight: 249 lb 5.4 oz (113.1 kg) IBW/kg (Calculated) : 57  Temp (24hrs), Avg:100.5 F (38.1 C), Min:98.8 F (37.1 C), Max:101.8 F (38.8 C)   Recent Labs Lab 07/09/16 0501 07/10/16 0439 07/11/16 0415 07/12/16 0534 07/13/16 0434 07/14/16 0515 07/15/16 0422  WBC 7.6 7.9  --  8.6 9.2  --   --   CREATININE 0.66 0.80 0.92 0.97 0.95 0.79 0.68  VANCOTROUGH  --   --   --   --   --  18  --     Estimated Creatinine Clearance: 83.2 mL/min (by C-G formula based on SCr of 0.68 mg/dL).    BMP Latest Ref Rng & Units 07/15/2016 07/14/2016 07/13/2016  Glucose 65 - 99 mg/dL 267(H) 203(H) 134(H)  BUN 6 - 20 mg/dL 31(H) 33(H) 38(H)  Creatinine 0.44 - 1.00 mg/dL 0.68 0.79 0.95  Sodium 135 - 145 mmol/L 136 138 140  Potassium 3.5 - 5.1 mmol/L 2.4(LL) 3.1(L) 3.7  Chloride 101 - 111 mmol/L 80(L) 80(L) 81(L)  CO2 22 - 32 mmol/L 48(H) 49(H) 50(H)  Calcium 8.9 - 10.3 mg/dL 7.7(L) 7.8(L) 8.2(L)   Magnesium (mg/dL)  Date Value  07/14/2016 2.0  01/04/2013 2.0   Phosphorus (mg/dL)  Date Value  07/14/2016 4.2   Albumin (g/dL)  Date Value  07/02/2016 3.1 (L)  06/30/2014 3.8     Allergies  Allergen Reactions  . Lyrica [Pregabalin] Other (See Comments)    Reaction: made Restless Leg Syndrome worse     Antimicrobials this admission: 4/20 ceftriaxone >> 4/20 4/21 levofloxacin  >>4/21 4/22 Unasyn >> 4/27   Vancomycin 4/27 >>  Zosyn 4/27 >>   Dose adjustments this admission:  Microbiology results: 4/17 BCx: STAPHYLOCOCCUS CAPITIS 4/18 BX: NG x 3 days  4/17 Sputum: STREPTOCOCCUS PNEUMONIAE 4/17 MRSA PCR: negative  4/19 BCx: negative 4/27 Sputum: no organisms seen  4/27 UCx: pending  4/27 BCx: no growth < 12 hours   Thank you for allowing pharmacy to be a part of this patient's care.  Raizel Wesolowski C, 07/15/2016 9:15 AM

## 2016-07-15 NOTE — Progress Notes (Signed)
Pt has sores inside of mouth and on lips. Some sores have scabs and others are oozing blood. ETT moved according to protocol

## 2016-07-15 NOTE — Progress Notes (Signed)
PULMONARY / CRITICAL CARE MEDICINE   Name: Hannah Powell MRN: [id number] DOB: 01-12-1946    ADMISSION DATE:  07/02/2016    SIGNIFICANT EVENTS: Noted events overnight. Fever curve improved. Cultures negative so far. On norepinephrine gtt down to 6 from 28 earlier today. Family updated at bedside. Requesting transfer to [facility]. Increased O2 overnight. Net positive overnight.   HISTORY OF PRESENT ILLNESS:   71 y.o.F former smoker pt of pulmonologist Dr. Mortimer Fries admitted 04/16 with acute on chronic hypercapnic hypoxic respiratory failure secondary to AECOPD and right sided pneumonia. Intubated 4/26.  PAST MEDICAL HISTORY :   has a past medical history of Anxiety; Arthritis; CHF (congestive heart failure) ([place]); COPD (chronic obstructive pulmonary disease) ([place]); Hypertension; Hypothyroidism; RLS (restless legs syndrome); Shortness of breath; and Sleep apnea.  has a past surgical history that includes Back surgery; Joint replacement; Foot arthroplasty; Total shoulder arthroplasty (Left, 07/02/2013); and Breast biopsy (Left).    VITAL SIGNS: Temp:  [98.8 F (37.1 C)-101.8 F (38.8 C)] 99.2 F (37.3 C) (04/29 1130) Pulse Rate:  [82-102] 88 (04/29 1500) Resp:  [8-26] 13 (04/29 1500) BP: (68-180)/(32-142) 119/71 (04/29 1500) SpO2:  [85 %-100 %] 98 % (04/29 1600) FiO2 (%):  [60 %] 60 % (04/29 1600) Weight:  [249 lb 5.4 oz (113.1 kg)] 249 lb 5.4 oz (113.1 kg) (04/29 0425) HEMODYNAMICS:   VENTILATOR SETTINGS: Vent Mode: PRVC FiO2 (%):  [60 %] 60 % Set Rate:  [16 bmp-25 bmp] 16 bmp Vt Set:  [500 mL] 500 mL PEEP:  [5 cmH20] 5 cmH20 INTAKE / OUTPUT:  Intake/Output Summary (Last 24 hours) at 07/15/16 1702 Last data filed at 07/15/16 8921  Gross per 24 hour  Intake          2102.27 ml  Output             1850 ml  Net           252.27 ml    PHYSICAL EXAMINATION: General:  Intubated, sedated Neuro:  Moves all extremities to painful stimuli, does not follow commands HEENT:   ett+ Cardiovascular:  s1s2+, regular Lungs:  Decreased air entry b/l, no wheezing/crackles Abdomen:  Distended, decreased bowel sounds Ext: no edema  LABS:  CBC  Recent Labs Lab 07/10/16 0439 07/12/16 0534 07/13/16 0434  WBC 7.9 8.6 9.2  HGB 13.3 12.5 13.1  HCT 40.8 39.2 40.1  PLT 105* 131* 154   Coag's No results for input(s): APTT, INR in the last 168 hours. BMET  Recent Labs Lab 07/13/16 0434 07/14/16 0515 07/15/16 0422 07/15/16 1300  NA 140 138 136  --   K 3.7 3.1* 2.4* 2.8*  CL 81* 80* 80*  --   CO2 50* 49* 48*  --   BUN 38* 33* 31*  --   CREATININE 0.95 0.79 0.68  --   GLUCOSE 134* 203* 267*  --    Electrolytes  Recent Labs Lab 07/13/16 0434 07/13/16 1653 07/14/16 0515 07/15/16 0422 07/15/16 1300  CALCIUM 8.2*  --  7.8* 7.7*  --   MG 1.7 2.1 2.0  --  2.0  PHOS 1.6* 8.6* 4.2  --   --    Sepsis Markers No results for input(s): LATICACIDVEN, PROCALCITON, O2SATVEN in the last 168 hours. ABG  Recent Labs Lab 07/14/16 0930 07/15/16 0500 07/15/16 0830  PHART 7.48* 7.44 7.52*  PCO2ART 87* 91* 71*  PO2ART 50* 90 67*   Liver Enzymes No results for input(s): AST, ALT, ALKPHOS, BILITOT, ALBUMIN in the last 168  hours. Cardiac Enzymes No results for input(s): TROPONINI, PROBNP in the last 168 hours. Glucose  Recent Labs Lab 07/14/16 1945 07/14/16 2359 07/15/16 0423 07/15/16 0726 07/15/16 1152 07/15/16 1608  GLUCAP 200* 197* 221* 217* 167* 158*    Imaging Dg Chest Port 1 View  Result Date: 07/15/2016 CLINICAL DATA:  Followup for pulmonary infiltrates. 71 y.o. Former smoker admitted 04/16 with acute on chronic hypercapnic hypoxic respiratory failure secondary to exacerbation of COPD and right sided pneumonia. Intubated 4/26. EXAM: PORTABLE CHEST 1 VIEW COMPARISON:  07/13/2016 FINDINGS: Patchy bilateral airspace opacities, noted throughout the central right lung and in both lung bases, has improved, although significant consolidation persists.  The bilateral pleural effusions appear decreased in size. There are no new lung abnormalities. No pneumothorax. The endotracheal tube, left internal jugular central venous line and nasogastric tube are stable. IMPRESSION: Improved lung aeration when compared the prior exam consistent with improving pneumonia and a decrease in bilateral pleural effusions. Electronically Signed   By: Lajean Manes M.D.   On: 07/15/2016 10:14    ASSESSMENT: ACUTE HYPOXIC RESP FAILURE / ARDS SHOCK LIKELY 2 TO SEPSIS PNEUMONIA / BILATERAL PLEURAL EFFUSIONS ALTERED MENTAL STATUS THROMBOCTOPENIA HYPOKALEMIA   PLAN Weaning vasopressors for MAP>65. Will continue zosyn/vanc. Echo wnl.  Will hold off on thoracentesis at this time. Lasix IV. Wean FiO2/PEEP for sats >90%.  Continue stress dose steroids.  DVT/GI prophylaxis. Glycemic control. Will transition to precedex.  DNR. Family updated at bedside. No beds available at [facility]. Called them again today.  Cc: 32 minutes   Dimas Chyle MD Pulmonary and Critical Care Medicine 07/15/2016, 5:02 PM

## 2016-07-16 LAB — GLUCOSE, CAPILLARY
GLUCOSE-CAPILLARY: 142 mg/dL — AB (ref 65–99)
GLUCOSE-CAPILLARY: 143 mg/dL — AB (ref 65–99)
Glucose-Capillary: 159 mg/dL — ABNORMAL HIGH (ref 65–99)
Glucose-Capillary: 149 mg/dL — ABNORMAL HIGH (ref 65–99)
Glucose-Capillary: 131 mg/dL — ABNORMAL HIGH (ref 65–99)

## 2016-07-16 LAB — BASIC METABOLIC PANEL
BUN: 38 mg/dL — ABNORMAL HIGH (ref 6–20)
Calcium: 7.9 mg/dL — ABNORMAL LOW (ref 8.9–10.3)
Chloride: 81 mmol/L — ABNORMAL LOW (ref 101–111)
Creatinine, Ser: 0.77 mg/dL (ref 0.44–1.00)
GFR calc Af Amer: >60 mL/min (ref >60)
GFR calc non Af Amer: >60 mL/min (ref >60)
GLUCOSE: 163 mg/dL — AB (ref 65–99)
POTASSIUM: 2.7 mmol/L — AB (ref 3.5–5.1)
Sodium: 142 mmol/L (ref 135–145)

## 2016-07-16 LAB — CBC
HEMATOCRIT: 40.6 % (ref 35.0–47.0)
Hemoglobin: 12.9 g/dL (ref 12.0–16.0)
MCH: 30.4 pg (ref 26.0–34.0)
MCHC: 31.7 g/dL — ABNORMAL LOW (ref 32.0–36.0)
MCV: 95.8 fL (ref 80.0–100.0)
Platelets: 220 10*3/uL (ref 150–440)
RBC: 4.24 MIL/uL (ref 3.80–5.20)
RDW: 16.9 % — AB (ref 11.5–14.5)
WBC: 12 10*3/uL — AB (ref 3.6–11.0)

## 2016-07-16 LAB — CULTURE, RESPIRATORY

## 2016-07-16 LAB — CULTURE, RESPIRATORY W GRAM STAIN

## 2016-07-16 LAB — ALBUMIN: ALBUMIN: 2.3 g/dL — AB (ref 3.5–5.0)

## 2016-07-16 LAB — PHOSPHORUS: Phosphorus: 2.7 mg/dL (ref 2.5–4.6)

## 2016-07-16 LAB — VANCOMYCIN, RANDOM: Vancomycin Rm: 19

## 2016-07-16 MED ORDER — SODIUM CHLORIDE 0.9% FLUSH
10.0000 mL | Freq: Two times a day (BID) | INTRAVENOUS | Status: DC
  Administered 2016-07-16 – 2016-07-24 (×14): 10 mL

## 2016-07-16 MED ORDER — POTASSIUM CHLORIDE 10 MEQ/50ML IV SOLN
10.0000 meq | INTRAVENOUS | Status: AC
  Administered 2016-07-16 (×4): 10 meq via INTRAVENOUS
  Filled 2016-07-16 (×4): qty 50

## 2016-07-16 MED ORDER — SODIUM CHLORIDE 0.9% FLUSH: 10.0000 mL | INTRAVENOUS | Status: DC | PRN

## 2016-07-16 MED ORDER — POTASSIUM CHLORIDE 20 MEQ/15ML (10%) PO SOLN
40.0000 meq | Freq: Once | ORAL | Status: AC
  Administered 2016-07-16: 40 meq
  Filled 2016-07-16: qty 30

## 2016-07-16 MED ORDER — INSULIN ASPART 100 UNIT/ML ~~LOC~~ SOLN
0.0000 [IU] | SUBCUTANEOUS | Status: DC
  Administered 2016-07-16: 2 [IU] via SUBCUTANEOUS
  Administered 2016-07-16: 3 [IU] via SUBCUTANEOUS
  Administered 2016-07-16 – 2016-07-19 (×4): 2 [IU] via SUBCUTANEOUS
  Filled 2016-07-16 (×5): qty 2

## 2016-07-16 MED ORDER — HYDROCORTISONE NA SUCCINATE PF 100 MG IJ SOLR
50.0000 mg | Freq: Two times a day (BID) | INTRAMUSCULAR | Status: DC
  Administered 2016-07-17 – 2016-07-19 (×5): 50 mg via INTRAVENOUS
  Filled 2016-07-16 (×5): qty 2

## 2016-07-16 NOTE — Progress Notes (Signed)
[facility] NAME: Hannah Powell    MR#:  [id number]  DATE OF BIRTH:  Jul 21, 1945  SUBJECTIVE:  Patient got intubated on 07/12/16.  No fever Husband in the room awake IV propofol, fentanyl and weaning IV levophed REVIEW OF SYSTEMS:   Review of Systems  Unable to perform ROS: Intubated   DRUG ALLERGIES:   Allergies  Allergen Reactions  . Lyrica [Pregabalin] Other (See Comments)    Reaction: made Restless Leg Syndrome worse     VITALS:  Blood pressure 114/62, pulse 76, temperature 98.3 F (36.8 C), resp. rate 16, height '5\' 5"'  (1.651 m), weight 111.6 kg (246 lb 0.5 oz), SpO2 97 %.  PHYSICAL EXAMINATION:   Physical Exam  GENERAL:  71 y.o.-year-old patient lying in the bed with moderate  distress. ObeseCritically ill  EYES: Pupils equal, round, reactive to light and accommodation. No scleral icterus. Extraocular muscles intact.  HEENT: Head atraumatic, normocephalic. Oropharynx and nasopharynx clear.Intubated and on the ventK:  Supple, no jugular venous distention. No thyroid enlargement, no tenderness.  LUNGS:distant breath sounds bilaterally, no wheezing,++ rales,no rhonchi. Shallow breathing CARDIOVASCULAR: S1, S2 normal. No murmurs, rubs, or gallops.  ABDOMEN: Soft, nontender, nondistended. Bowel sounds present. No organomegaly or mass.  EXTREMITIES: No cyanosis, clubbing  ++ edema b/l.    NEUROLOGIC:Intubated Psychiatric: Intubated  SKIN: No obvious rash, lesion, or ulcer.   LABORATORY PANEL:  CBC  Recent Labs Lab 07/15/16 1209  WBC 12.0*  HGB 12.9  HCT 40.6  PLT 220    Chemistries   Recent Labs Lab 07/15/16 1300 07/16/16 0443  NA  --  142  K 2.8* 2.7*  CL  --  81*  CO2  --  >50*  GLUCOSE  --  163*  BUN  --  38*  CREATININE  --  0.77  CALCIUM  --  7.9*  MG 2.0  --    Cardiac Enzymes No results for input(s): TROPONINI in the last 168 hours. RADIOLOGY:  Dg Chest Port 1 View  Result Date:  07/15/2016 CLINICAL DATA:  Followup for pulmonary infiltrates. 71 y.o. Former smoker admitted 04/16 with acute on chronic hypercapnic hypoxic respiratory failure secondary to exacerbation of COPD and right sided pneumonia. Intubated 4/26. EXAM: PORTABLE CHEST 1 VIEW COMPARISON:  07/13/2016 FINDINGS: Patchy bilateral airspace opacities, noted throughout the central right lung and in both lung bases, has improved, although significant consolidation persists. The bilateral pleural effusions appear decreased in size. There are no new lung abnormalities. No pneumothorax. The endotracheal tube, left internal jugular central venous line and nasogastric tube are stable. IMPRESSION: Improved lung aeration when compared the prior exam consistent with improving pneumonia and a decrease in bilateral pleural effusions. Electronically Signed   By: Lajean Manes M.D.   On: 07/15/2016 10:14   ASSESSMENT AND PLAN:  [name] a 71 y.o.femalepresented with respiratory distress. She stated on Thursday she started feeling bad. She needed to increase her oxygen from 2 L to 3 L. On Saturday she had shaking chills and fever.   1. Acute on chronic hypoxic/hypercarbic respiratory failure with hypoxia and acidosis.  -Appears combination of severe COPD and CHF acute diastolic (Echo in the past EF 65%) -Patient continued to worsen despite aggressive medical management. -family had discussion with DR Mortimer Fries. -Patient intubated on  (07/12/16) -IV fentanyl drip, propofol gtt -Currently on Levophed -fever curve better -Repeat cultures including blood cultures sputum culture no growth from 07/13/2016 -UOP (-) 6.2 liters (not on  lasix)  2. Clinical sepsis with Right-sided pneumonia, leukocytosis and tachypnea. IV Vanco and Zosyn  - repeat blood cultures from 4/27 negative  - sputum cultures negative -on IV solucortef (stress doses)  3. Acute kidney injury due to ATN from pneumonia and sepsis. -creat improving Baseline  creat 0.75 in 01/2016 Came in with 2.53--- 2.08--1.10--0.75  4. Acute on chronic COPD on chronic oxygen. -management as above  5.History of obstructive sleep apnea   6.  history of Essential hypertension patient is hypotensive on IV levophed  7. Hypothyroidism    8. Palliative care has seen and met patient and family. -Patient remains DO NOT RESUSCITATE  Overall critically ill  Case discussed with Care Management/Social Worker. Management plans discussed with the patient, family and they are in agreement.  CODE STATUS: DNR  DVT Prophylaxis: lovenox  TOTAL TIME TAKING CARE OF THIS PATIENT: 30 minutes.  >50% time spent on counselling and coordination of careHusband and daughter   Note: This dictation was prepared with Dragon dictation along with smaller phrase technology. Any transcriptional errors that result from this process are unintentional.  Alean Kromer M.D on 07/16/2016 at 2:04 PM  Between 7am to 6pm - Pager - [phone]  After 6pm go to [email or web address] - password [email or web address] [facility]  Office  [phone]  CC: Primary care physician; Adin Hector, MD

## 2016-07-16 NOTE — Progress Notes (Signed)
PT PROFILE: 71 y.o. F with moderate to severe COPD, chronic oxygen therapy, moderately impaired at baseline. Admitted 07/02/16 with acute on chronic hypoxemic/hypercarbic respiratory failure due to severe right-sided pneumonia. Pneumococcal urine antigen was positive. Patient was initially supported with noninvasive ventilation. She was transferred out of the intensive care unit on the day following admission but returned shortly thereafter with worsening hypercarbic respiratory failure. Was transferred out and returned once again several days later. Ultimately was intubated 04/26. Has remained ventilator dependent since that time.  SUBJ: Intubated, continuous fentanyl infusion, continuous propofol infusion. Nonetheless, follows commands and appears to understand conversation. No distress on mechanical ventilation  OBJ: Vitals:   07/16/16 1500 07/16/16 1530 07/16/16 1545 07/16/16 1600  BP: (!) 90/51 (!) 81/46  (!) 88/49  Pulse: 76 73  72  Resp:      Temp:      TempSrc:      SpO2: 97% 98% 97% 98%  Weight:      Height:       NAD Synchronous with ventilator HEENT: NCAT, sclerae white Neck supple, jugular venous pulsations not well visualized Breath sounds mildly diminished and prolonged, no wheezes heard Cardiac: Regular, no murmurs Abdomen: Obese, soft, bowel sounds present Extremities: Warm, minimal symmetric edema Neuro: No focal deficits noted  BMP Latest Ref Rng & Units 07/16/2016 07/15/2016 07/15/2016  Glucose 65 - 99 mg/dL 163(H) - 267(H)  BUN 6 - 20 mg/dL 38(H) - 31(H)  Creatinine 0.44 - 1.00 mg/dL 0.77 - 0.68  Sodium 135 - 145 mmol/L 142 - 136  Potassium 3.5 - 5.1 mmol/L 2.7(LL) 2.8(L) 2.4(LL)  Chloride 101 - 111 mmol/L 81(L) - 80(L)  CO2 22 - 32 mmol/L >50(H) - 48(H)  Calcium 8.9 - 10.3 mg/dL 7.9(L) - 7.7(L)   CBC Latest Ref Rng & Units 07/15/2016 07/13/2016 07/12/2016  WBC 3.6 - 11.0 K/uL 12.0(H) 9.2 8.6  Hemoglobin 12.0 - 16.0 g/dL 12.9 13.1 12.5  Hematocrit 35.0 - 47.0 %  40.6 40.1 39.2  Platelets 150 - 440 K/uL 220 154 131(L)    Results for orders placed or performed during the hospital encounter of 07/02/16  Blood culture (routine x 2)     Status: Abnormal   Collection Time: 07/02/16 11:47 AM  Result Value Ref Range Status   Specimen Description BLOOD LEFT ARM  Final   Special Requests   Final    BOTTLES DRAWN AEROBIC AND ANAEROBIC Blood Culture results may not be optimal due to an excessive volume of blood received in culture bottles   Culture  Setup Time   Final    GRAM POSITIVE COCCI IN CLUSTERS ANAEROBIC BOTTLE ONLY CRITICAL VALUE NOTED.  VALUE IS CONSISTENT WITH PREVIOUSLY REPORTED AND CALLED VALUE. Performed at [facility], [place] [address] [place], [name] [address]    Culture STAPHYLOCOCCUS CAPITIS (A)  Final   Report Status 07/07/2016 FINAL  Final   Organism ID, Bacteria STAPHYLOCOCCUS CAPITIS  Final      Susceptibility   Staphylococcus capitis - MIC*    CIPROFLOXACIN 4 RESISTANT Resistant     ERYTHROMYCIN >=8 RESISTANT Resistant     GENTAMICIN <=0.5 SENSITIVE Sensitive     OXACILLIN >=4 RESISTANT Resistant     TETRACYCLINE <=1 SENSITIVE Sensitive     VANCOMYCIN <=0.5 SENSITIVE Sensitive     TRIMETH/SULFA <=10 SENSITIVE Sensitive     CLINDAMYCIN INTERMEDIATE Intermediate     RIFAMPIN <=0.5 SENSITIVE Sensitive     Inducible Clindamycin NEGATIVE Sensitive     * STAPHYLOCOCCUS  CAPITIS  Blood culture (routine x 2)     Status: Abnormal   Collection Time: 07/02/16 11:47 AM  Result Value Ref Range Status   Specimen Description BLOOD RIGHT ARM  Final   Special Requests   Final    BOTTLES DRAWN AEROBIC AND ANAEROBIC Blood Culture adequate volume   Culture  Setup Time   Final    GRAM POSITIVE COCCI IN BOTH AEROBIC AND ANAEROBIC BOTTLES CRITICAL RESULT CALLED TO, READ BACK BY AND VERIFIED WITH: SHEEMA HALLAJI 07/03/16 @ 41  [place]    Culture (A)  Final    STAPHYLOCOCCUS CAPITIS SUSCEPTIBILITIES PERFORMED ON PREVIOUS CULTURE WITHIN  THE LAST 5 DAYS. Performed at [facility], [place] [address] [place], [place] [address]    Report Status 07/06/2016 FINAL  Final  Blood Culture ID Panel (Reflexed)     Status: Abnormal   Collection Time: 07/02/16 11:47 AM  Result Value Ref Range Status   Enterococcus species NOT DETECTED NOT DETECTED Final   Listeria monocytogenes NOT DETECTED NOT DETECTED Final   Staphylococcus species DETECTED (A) NOT DETECTED Final    Comment: Methicillin (oxacillin) resistant coagulase negative staphylococcus. Possible blood culture contaminant (unless isolated from more than one blood culture draw or clinical case suggests pathogenicity). No antibiotic treatment is indicated for blood  culture contaminants. CRITICAL RESULT CALLED TO, READ BACK BY AND VERIFIED WITH: SHEEMA HALLAJI 07/03/16 @ 3149  [place]    Staphylococcus aureus NOT DETECTED NOT DETECTED Final   Methicillin resistance DETECTED (A) NOT DETECTED Final    Comment: CRITICAL RESULT CALLED TO, READ BACK BY AND VERIFIED WITH: SHEEMA HALLAJI 07/03/16 @ 1725  [facility]    Streptococcus species NOT DETECTED NOT DETECTED Final   Streptococcus agalactiae NOT DETECTED NOT DETECTED Final   Streptococcus pneumoniae NOT DETECTED NOT DETECTED Final   Streptococcus pyogenes NOT DETECTED NOT DETECTED Final   Acinetobacter baumannii NOT DETECTED NOT DETECTED Final   Enterobacteriaceae species NOT DETECTED NOT DETECTED Final   Enterobacter cloacae complex NOT DETECTED NOT DETECTED Final   Escherichia coli NOT DETECTED NOT DETECTED Final   Klebsiella oxytoca NOT DETECTED NOT DETECTED Final   Klebsiella pneumoniae NOT DETECTED NOT DETECTED Final   Proteus species NOT DETECTED NOT DETECTED Final   Serratia marcescens NOT DETECTED NOT DETECTED Final   Haemophilus influenzae NOT DETECTED NOT DETECTED Final   Neisseria meningitidis NOT DETECTED NOT DETECTED Final   Pseudomonas aeruginosa NOT DETECTED NOT DETECTED Final   Candida albicans NOT DETECTED NOT  DETECTED Final   Candida glabrata NOT DETECTED NOT DETECTED Final   Candida krusei NOT DETECTED NOT DETECTED Final   Candida parapsilosis NOT DETECTED NOT DETECTED Final   Candida tropicalis NOT DETECTED NOT DETECTED Final  MRSA PCR Screening     Status: None   Collection Time: 07/02/16  3:14 PM  Result Value Ref Range Status   MRSA by PCR NEGATIVE NEGATIVE Final    Comment:        The GeneXpert MRSA Assay (FDA approved for NASAL specimens only), is one component of a comprehensive MRSA colonization surveillance program. It is not intended to diagnose MRSA infection nor to guide or monitor treatment for MRSA infections.   Culture, expectorated sputum-assessment     Status: None   Collection Time: 07/03/16 12:23 AM  Result Value Ref Range Status   Specimen Description SPUTUM  Final   Special Requests NONE  Final   Sputum evaluation THIS SPECIMEN IS ACCEPTABLE FOR SPUTUM CULTURE  Final  Report Status 07/03/2016 FINAL  Final  Culture, respiratory (NON-Expectorated)     Status: None   Collection Time: 07/03/16 12:23 AM  Result Value Ref Range Status   Specimen Description SPUTUM  Final   Special Requests NONE Reflexed from [id number]  Final   Gram Stain   Final    MODERATE WBC PRESENT, PREDOMINANTLY PMN RARE YEAST FEW GRAM POSITIVE COCCI IN PAIRS Performed at [facility], [address] [place], [place] [address]    Culture FEW STREPTOCOCCUS PNEUMONIAE  Final   Report Status 07/05/2016 FINAL  Final   Organism ID, Bacteria STREPTOCOCCUS PNEUMONIAE  Final      Susceptibility   Streptococcus pneumoniae - MIC*    ERYTHROMYCIN >=8 RESISTANT Resistant     LEVOFLOXACIN >=16 RESISTANT Resistant     PENICILLIN (meningitis) <=0.06 SENSITIVE Sensitive     PENICILLIN (non-meningitis) <=0.06 SENSITIVE Sensitive     PENICILLIN (oral) <=0.06 SENSITIVE Sensitive     CEFTRIAXONE (non-meningitis) <=0.12 SENSITIVE Sensitive     CEFTRIAXONE (meningitis) <=0.12 SENSITIVE Sensitive     *  FEW STREPTOCOCCUS PNEUMONIAE  Culture, blood (single) w Reflex to ID Panel     Status: None   Collection Time: 07/05/16 11:23 AM  Result Value Ref Range Status   Specimen Description BLOOD R HAND  Final   Special Requests   Final    BOTTLES DRAWN AEROBIC AND ANAEROBIC Blood Culture adequate volume   Culture NO GROWTH 5 DAYS  Final   Report Status 07/10/2016 FINAL  Final  Urine culture     Status: None   Collection Time: 07/13/16 12:16 AM  Result Value Ref Range Status   Specimen Description URINE, RANDOM  Final   Special Requests NONE  Final   Culture   Final    NO GROWTH Performed at [facility], [place] [address] [place], [place] [address]    Report Status 07/14/2016 FINAL  Final  Culture, blood (Routine X 2) w Reflex to ID Panel     Status: None (Preliminary result)   Collection Time: 07/13/16 12:47 AM  Result Value Ref Range Status   Specimen Description BLOOD RIGHT HAND  Final   Special Requests   Final    BOTTLES DRAWN AEROBIC AND ANAEROBIC Blood Culture adequate volume   Culture NO GROWTH 3 DAYS  Final   Report Status PENDING  Incomplete  Culture, blood (Routine X 2) w Reflex to ID Panel     Status: None (Preliminary result)   Collection Time: 07/13/16 12:47 AM  Result Value Ref Range Status   Specimen Description BLOOD LEFT HAND  Final   Special Requests   Final    BOTTLES DRAWN AEROBIC AND ANAEROBIC Blood Culture adequate volume   Culture NO GROWTH 3 DAYS  Final   Report Status PENDING  Incomplete  Culture, respiratory (NON-Expectorated)     Status: None   Collection Time: 07/13/16  5:15 PM  Result Value Ref Range Status   Specimen Description TRACHEAL ASPIRATE  Final   Special Requests NONE  Final   Gram Stain   Final    ABUNDANT WBC PRESENT,BOTH PMN AND MONONUCLEAR NO ORGANISMS SEEN Performed at [facility], [address] [place], [place] [address]    Culture MODERATE CANDIDA ALBICANS  Final   Report Status 07/16/2016 FINAL  Final    Anti-infectives    Start     Dose/Rate Route Frequency Ordered Stop   07/16/16 1100  vancomycin (VANCOCIN) IVPB 1000 mg/200 mL  premix     1,000 mg 200 mL/hr over 60 Minutes Intravenous Every 18 hours 07/15/16 1836     07/13/16 1800  vancomycin (VANCOCIN) IVPB 1000 mg/200 mL premix  Status:  Discontinued     1,000 mg 200 mL/hr over 60 Minutes Intravenous Every 12 hours 07/13/16 1721 07/15/16 1836   07/13/16 1200  piperacillin-tazobactam (ZOSYN) IVPB 3.375 g     3.375 g 12.5 mL/hr over 240 Minutes Intravenous Every 8 hours 07/13/16 1050     07/13/16 1100  vancomycin (VANCOCIN) 1,250 mg in sodium chloride 0.9 % 250 mL IVPB     1,250 mg 166.7 mL/hr over 90 Minutes Intravenous  Once 07/13/16 1050 07/13/16 1303   07/08/16 1200  Ampicillin-Sulbactam (UNASYN) 3 g in sodium chloride 0.9 % 100 mL IVPB  Status:  Discontinued     3 g 200 mL/hr over 30 Minutes Intravenous Every 8 hours 07/08/16 1026 07/08/16 1034   07/08/16 1200  Ampicillin-Sulbactam (UNASYN) 3 g in sodium chloride 0.9 % 100 mL IVPB  Status:  Discontinued     3 g 200 mL/hr over 30 Minutes Intravenous Every 6 hours 07/08/16 1034 07/13/16 1050   07/08/16 1000  amoxicillin-clavulanate (AUGMENTIN) 875-125 MG per tablet 1 tablet  Status:  Discontinued     1 tablet Oral Every 12 hours 07/08/16 0955 07/08/16 1026   07/07/16 1000  levofloxacin (LEVAQUIN) tablet 500 mg  Status:  Discontinued     500 mg Oral Daily 07/07/16 0940 07/08/16 0955   07/05/16 1700  vancomycin (VANCOCIN) 1,500 mg in sodium chloride 0.9 % 500 mL IVPB  Status:  Discontinued     1,500 mg 250 mL/hr over 120 Minutes Intravenous Every 24 hours 07/05/16 1444 07/06/16 1436   07/05/16 1030  vancomycin (VANCOCIN) IVPB 1000 mg/200 mL premix     1,000 mg 200 mL/hr over 60 Minutes Intravenous  Once 07/05/16 0928 07/05/16 1200   07/05/16 1000  cefTRIAXone (ROCEPHIN) 1 g in dextrose 5 % 50 mL IVPB  Status:  Discontinued     1 g 120 mL/hr over 30 Minutes Intravenous Every 24  hours 07/05/16 0942 07/07/16 0940   07/04/16 1500  ceFEPIme (MAXIPIME) 2 g in dextrose 5 % 50 mL IVPB  Status:  Discontinued     2 g 100 mL/hr over 30 Minutes Intravenous Every 12 hours 07/04/16 1346 07/05/16 1032   07/04/16 1000  azithromycin (ZITHROMAX) tablet 250 mg  Status:  Discontinued     250 mg Oral Daily 07/04/16 0742 07/05/16 0942   07/02/16 1800  ceFEPIme (MAXIPIME) 1 GM / 88mL IVPB premix  Status:  Discontinued     1 g 100 mL/hr over 30 Minutes Intravenous Every 24 hours 07/02/16 1342 07/02/16 1342   07/02/16 1800  ceFEPIme (MAXIPIME) 2 g in dextrose 5 % 50 mL IVPB  Status:  Discontinued     2 g 100 mL/hr over 30 Minutes Intravenous Every 24 hours 07/02/16 1343 07/04/16 1346   07/02/16 1400  vancomycin (VANCOCIN) 500 mg in sodium chloride 0.9 % 100 mL IVPB     500 mg 100 mL/hr over 60 Minutes Intravenous  Once 07/02/16 1337 07/02/16 1935   07/02/16 1300  azithromycin (ZITHROMAX) 500 mg in dextrose 5 % 250 mL IVPB  Status:  Discontinued     500 mg 250 mL/hr over 60 Minutes Intravenous Daily 07/02/16 1242 07/04/16 0742   07/02/16 1215  vancomycin (VANCOCIN) IVPB 1000 mg/200 mL premix     1,000 mg 200 mL/hr over 60  Minutes Intravenous  Once 07/02/16 1211 07/02/16 1333   07/02/16 1215  piperacillin-tazobactam (ZOSYN) IVPB 3.375 g     3.375 g 100 mL/hr over 30 Minutes Intravenous  Once 07/02/16 1212 07/02/16 1256     CXR: NNF  IMPRESSION: Acute on chronic hypoxemic, hypercarbic respiratory failure Moderate to severe COPD at baseline Pneumococcal pneumonia - extensive infiltrate in right lung Resolving bronchospasm Hypotension - sedation induced Acute delirium - controlled Hypokalemia  PLAN/REC: Cont vent support - settings reviewed and/or adjusted Wean in PSV as tolerated Cont vent bundle Daily SBT if/when meets criteria Continue nebulized steroids and bronchodilators Monitor temp, WBC count Micro and abx as above Monitor BMET intermittently Monitor I/Os Correct  electrolytes as indicated Sliding scale insulin regimen adjusted Continue PAD protocol Wean norepinephrine to off for map greater than 36 Husband and sister updated in detail at the bedside. We discussed goals of care and current plan of treatment  CCM time: 40 mins The above time includes time spent in consultation with patient and/or family members and reviewing care plan on multidisciplinary rounds  Merton Border, MD PCCM service Mobile [phone] Pager [phone]

## 2016-07-16 NOTE — Progress Notes (Signed)
Pharmacy Antibiotic Note  Hannah Powell is a 71 y.o. female admitted on 07/02/2016 with  pneumonia.  Pharmacy has been consulted for vancomycin and Zosyn dosing.   Plan: 1. Patient with persistent fevers and patient panned cultured. Coverage expanded and will continue Zosyn EI 3.375g IV Q8hr.   2. Continue vancomycin 1g IV Q24hr for goal trough of 15-20. Will obtain follow up trough as clinically indicated.    Height: 5\' 5"  (165.1 cm) Weight: 246 lb 0.5 oz (111.6 kg) IBW/kg (Calculated) : 57  Temp (24hrs), Avg:98.1 F (36.7 C), Min:97.7 F (36.5 C), Max:98.9 F (37.2 C)   Recent Labs Lab 07/10/16 0439  07/12/16 0534 07/13/16 0434 07/14/16 0515 07/15/16 0422 07/15/16 1209 07/15/16 1717 07/16/16 0443 07/16/16 0958  WBC 7.9  --  8.6 9.2  --   --  12.0*  --   --   --   CREATININE 0.80  < > 0.97 0.95 0.79 0.68  --   --  0.77  --   VANCOTROUGH  --   --   --   --  18  --   --  26*  --   --   VANCORANDOM  --   --   --   --   --   --   --   --   --  19  < > = values in this interval not displayed.  Estimated Creatinine Clearance: 82.6 mL/min (by C-G formula based on SCr of 0.77 mg/dL).    BMP Latest Ref Rng & Units 07/16/2016 07/15/2016 07/15/2016  Glucose 65 - 99 mg/dL 163(H) - 267(H)  BUN 6 - 20 mg/dL 38(H) - 31(H)  Creatinine 0.44 - 1.00 mg/dL 0.77 - 0.68  Sodium 135 - 145 mmol/L 142 - 136  Potassium 3.5 - 5.1 mmol/L 2.7(LL) 2.8(L) 2.4(LL)  Chloride 101 - 111 mmol/L 81(L) - 80(L)  CO2 22 - 32 mmol/L >50(H) - 48(H)  Calcium 8.9 - 10.3 mg/dL 7.9(L) - 7.7(L)   Magnesium (mg/dL)  Date Value  07/15/2016 2.0  01/04/2013 2.0   Phosphorus (mg/dL)  Date Value  07/16/2016 2.7   Albumin (g/dL)  Date Value  07/16/2016 2.3 (L)  06/30/2014 3.8     Allergies  Allergen Reactions  . Lyrica [Pregabalin] Other (See Comments)    Reaction: made Restless Leg Syndrome worse     Antimicrobials this admission: 4/20 ceftriaxone >> 4/20 4/21 levofloxacin >>4/21 4/22 Unasyn >>  4/27   Vancomycin 4/27 >>  Zosyn 4/27 >>   Dose adjustments this admission:  Microbiology results: 4/17 BCx: STAPHYLOCOCCUS CAPITIS 4/18 BX: NG x 3 days  4/17 Sputum: STREPTOCOCCUS PNEUMONIAE 4/17 MRSA PCR: negative  4/19 BCx: negative 4/27 Sputum: no organisms seen  4/27 UCx: pending  4/27 BCx: no growth < 12 hours   Pharmacy will continue to monitor and adjust per consult.   MLS 07/16/2016 4:28 PM

## 2016-07-16 NOTE — Progress Notes (Signed)
Pt was more awake this shift and NP wanted pt sedated for comfort over night. Husband was at bedside earlier in shift and happy with temperature being down, and that pt was not on as much sedation during the day, but reported the dayshift RN wanted them to limit visit so she could get some rest. Good urine output following Lasix administration. Rectal tube intact and patent. Foley intact and patent. No other concerns, continue to monitor.

## 2016-07-16 NOTE — Progress Notes (Signed)
Nutrition Follow-up  DOCUMENTATION CODES:   Morbid obesity  INTERVENTION:  1. Continue VHP @ 59mL/hr, with propofol @ 13.40mL/hr patient is receiving 1796 calories (116% estimated needs)  NUTRITION DIAGNOSIS:   Inadequate oral intake related to acute illness as evidenced by NPO status. -ongoing  GOAL:   Provide needs based on ASPEN/SCCM guidelines  -exceeding currently  MONITOR:   TF tolerance, Vent status, Labs, Weight trends  ASSESSMENT:   71 yo female admitted with acute respiratory failure and sepsis with pneumonia on 4/16, on and off Bipap, ultimately requiring intubation on 4/26.  Pt with hx of CHF, COPD, HTN  Discussed in rounds. Patient is currently intubated on ventilator support MV: 8.1 L/min Temp (24hrs), Avg:98.3 F (36.8 C), Min:97.7 F (36.5 C), Max:99.2 F (37.3 C) Propofol: 13.5 ml/hr --> 356 calories Labs and medications reviewed: CBGs 142-159, K 2.7 KCL 27mEq x4 hrs, Senokot-S, fentanyl gtt  Diet Order:     Skin:  Wound (see comment) (stage II sacrum)  Last BM:  07/06/16  Height:   Ht Readings from Last 1 Encounters:  07/08/16 5\' 5"  (1.651 m)    Weight:   Wt Readings from Last 1 Encounters:  07/16/16 246 lb 0.5 oz (111.6 kg)    Ideal Body Weight:  56.81 kg  BMI:  Body mass index is 40.94 kg/m.  Estimated Nutritional Needs:   Kcal:  4665-9935 kcals  Protein:  >/= 114 g  Fluid:  >/= 1.5 L  EDUCATION NEEDS:   No education needs identified at this time  Satira Anis. Alaura Schippers, MS, RD LDN Inpatient Clinical Dietitian Pager [phone]

## 2016-07-17 ENCOUNTER — Inpatient Hospital Stay: Payer: Medicare HMO

## 2016-07-17 DIAGNOSIS — Z9911 Dependence on respirator [ventilator] status: Secondary | ICD-10-CM

## 2016-07-17 DIAGNOSIS — J90 Pleural effusion, not elsewhere classified: Secondary | ICD-10-CM

## 2016-07-17 LAB — BLOOD GAS, ARTERIAL
ACID-BASE EXCESS: 30.9 mmol/L — AB (ref 0.0–2.0)
Bicarbonate: 61.9 mmol/L — ABNORMAL HIGH (ref 20.0–28.0)
FIO2: 70
LHR: 16 {breaths}/min
O2 SAT: 93.6 %
PCO2 ART: 89 mmHg — AB (ref 32.0–48.0)
PEEP: 5 cmH2O
PH ART: 7.45 (ref 7.350–7.450)
PO2 ART: 66 mmHg — AB (ref 83.0–108.0)
Patient temperature: 37
VT: 500 mL

## 2016-07-17 LAB — BASIC METABOLIC PANEL
BUN: 47 mg/dL — AB (ref 6–20)
CALCIUM: 8.3 mg/dL — AB (ref 8.9–10.3)
CO2: >50 mmol/L — ABNORMAL HIGH (ref 22–32)
CREATININE: 0.74 mg/dL (ref 0.44–1.00)
Chloride: 84 mmol/L — ABNORMAL LOW (ref 101–111)
GFR calc Af Amer: >60 mL/min (ref >60)
GLUCOSE: 104 mg/dL — AB (ref 65–99)
Potassium: 2.8 mmol/L — ABNORMAL LOW (ref 3.5–5.1)
Sodium: 142 mmol/L (ref 135–145)

## 2016-07-17 LAB — GLUCOSE, CAPILLARY
GLUCOSE-CAPILLARY: 132 mg/dL — AB (ref 65–99)
GLUCOSE-CAPILLARY: 138 mg/dL — AB (ref 65–99)
GLUCOSE-CAPILLARY: 91 mg/dL (ref 65–99)
Glucose-Capillary: 109 mg/dL — ABNORMAL HIGH (ref 65–99)
Glucose-Capillary: 114 mg/dL — ABNORMAL HIGH (ref 65–99)
Glucose-Capillary: 135 mg/dL — ABNORMAL HIGH (ref 65–99)
Glucose-Capillary: 119 mg/dL — ABNORMAL HIGH (ref 65–99)

## 2016-07-17 LAB — TRIGLYCERIDES: TRIGLYCERIDES: 142 mg/dL (ref <150)

## 2016-07-17 LAB — PROTEIN, PLEURAL OR PERITONEAL FLUID

## 2016-07-17 LAB — MAGNESIUM: Magnesium: 2.3 mg/dL (ref 1.7–2.4)

## 2016-07-17 LAB — CBC
HCT: 34.4 % — ABNORMAL LOW (ref 35.0–47.0)
Hemoglobin: 11.2 g/dL — ABNORMAL LOW (ref 12.0–16.0)
MCH: 30.8 pg (ref 26.0–34.0)
MCHC: 32.6 g/dL (ref 32.0–36.0)
MCV: 94.5 fL (ref 80.0–100.0)
PLATELETS: 241 10*3/uL (ref 150–440)
RBC: 3.64 MIL/uL — ABNORMAL LOW (ref 3.80–5.20)
RDW: 16.2 % — AB (ref 11.5–14.5)
WBC: 10.8 10*3/uL (ref 3.6–11.0)

## 2016-07-17 LAB — LACTATE DEHYDROGENASE, PLEURAL OR PERITONEAL FLUID: LD, Fluid: 92 U/L — ABNORMAL HIGH (ref 3–23)

## 2016-07-17 LAB — BODY FLUID CELL COUNT WITH DIFFERENTIAL
EOS FL: 0 %
Lymphs, Fluid: 55 %
Monocyte-Macrophage-Serous Fluid: 21 %
NEUTROPHIL FLUID: 24 %
WBC FLUID: 153 uL

## 2016-07-17 MED ORDER — ORAL CARE MOUTH RINSE
15.0000 mL | OROMUCOSAL | Status: DC
  Administered 2016-07-17 – 2016-07-18 (×5): 15 mL via OROMUCOSAL

## 2016-07-17 MED ORDER — POTASSIUM CHLORIDE 20 MEQ/15ML (10%) PO SOLN
40.0000 meq | Freq: Three times a day (TID) | ORAL | Status: AC
  Administered 2016-07-17 (×3): 40 meq
  Filled 2016-07-17 (×4): qty 30

## 2016-07-17 MED ORDER — POTASSIUM CHLORIDE 10 MEQ/100ML IV SOLN
10.0000 meq | INTRAVENOUS | Status: AC
  Administered 2016-07-17 (×3): 10 meq via INTRAVENOUS
  Filled 2016-07-17 (×3): qty 100

## 2016-07-17 MED ORDER — SODIUM CHLORIDE 0.9 % IV SOLN: 30.0000 meq | Freq: Once | INTRAVENOUS | Status: DC

## 2016-07-17 NOTE — Progress Notes (Signed)
[facility] NAME: Hannah Powell    MR#:  [id number]  DATE OF BIRTH:  June 26, 1945  SUBJECTIVE:  Patient got intubated on 07/12/16.  No fever awake IV propofol and weaning IV levophed REVIEW OF SYSTEMS:   Review of Systems  Unable to perform ROS: Intubated   DRUG ALLERGIES:   Allergies  Allergen Reactions  . Lyrica [Pregabalin] Other (See Comments)    Reaction: made Restless Leg Syndrome worse     VITALS:  Blood pressure 119/64, pulse 63, temperature 98.6 F (37 C), temperature source Axillary, resp. rate 16, height 5' 5" (1.651 m), weight 111.7 kg (246 lb 4.1 oz), SpO2 97 %.  PHYSICAL EXAMINATION:   Physical Exam  GENERAL:  71 y.o.-year-old patient lying in the bed with moderate  distress. ObeseCritically ill  EYES: Pupils equal, round, reactive to light and accommodation. No scleral icterus. Extraocular muscles intact.  HEENT: Head atraumatic, normocephalic. Oropharynx and nasopharynx clear.Intubated and on the ventK:  Supple, no jugular venous distention. No thyroid enlargement, no tenderness.  LUNGS:distant breath sounds bilaterally, no wheezing,++ rales,no rhonchi. Shallow breathing CARDIOVASCULAR: S1, S2 normal. No murmurs, rubs, or gallops.  ABDOMEN: Soft, nontender, nondistended. Bowel sounds present. No organomegaly or mass.  EXTREMITIES: No cyanosis, clubbing  ++ edema b/l.    NEUROLOGIC:Intubated Psychiatric: Intubated  SKIN: No obvious rash, lesion, or ulcer.   LABORATORY PANEL:  CBC  Recent Labs Lab 07/17/16 0424  WBC 10.8  HGB 11.2*  HCT 34.4*  PLT 241    Chemistries   Recent Labs Lab 07/17/16 0424  NA 142  K 2.8*  CL 84*  CO2 >50*  GLUCOSE 104*  BUN 47*  CREATININE 0.74  CALCIUM 8.3*  MG 2.3   Cardiac Enzymes No results for input(s): TROPONINI in the last 168 hours. RADIOLOGY:  Dg Chest Port 1 View  Result Date: 07/17/2016 CLINICAL DATA:  Respiratory failure. EXAM: PORTABLE  CHEST 1 VIEW COMPARISON:  07/15/2016.  07/13/2016.  CT 07/13/2016. FINDINGS: Endotracheal tube, NG tube, left IJ line stable position. Stable cardiomegaly. Progressive right upper lobe infiltrate and/or edema. Persistent bibasilar infiltrates and/or edema. Small bilateral pleural effusions. No pneumothorax . Left shoulder replacement. IMPRESSION: 1. Lines and tubes in stable position . 2. Progressive right upper lobe infiltrate. Persistent bibasilar pulmonary infiltrates. Small bilateral pleural effusions. 3.  Cardiomegaly. Electronically Signed   By: Marcello Moores  Register   On: 07/17/2016 06:43   Dg Chest Port 1v Same Day  Result Date: 07/17/2016 CLINICAL DATA:  Pleural effusion EXAM: PORTABLE CHEST 1 VIEW COMPARISON:  07/17/2016 FINDINGS: Cardiac shadow is enlarged but stable. Endotracheal tube, nasogastric catheter and left jugular central line are again seen and stable. Improved aeration is noted bilaterally. Some persistent rounded density is noted in the right mid lung likely related to fluid in the minor fissure. Small pleural effusion is noted. Mild interstitial changes are seen. IMPRESSION: Significantly improved aeration when compared with the prior exam in the right lung. Fluid in the minor fissure remains. Tubes and lines as described. Electronically Signed   By: Inez Catalina M.D.   On: 07/17/2016 11:19   ASSESSMENT AND PLAN:  [name] a 71 y.o.femalepresented with respiratory distress. She stated on Thursday she started feeling bad. She needed to increase her oxygen from 2 L to 3 L. On Saturday she had shaking chills and fever.   1. Acute on chronic hypoxic/hypercarbic respiratory failure with hypoxia and acidosis.  -Appears combination of severe COPD and CHF  acute diastolic (Echo in the past EF 65%) -Patient continued to worsen despite aggressive medical management. -family had discussion with DR Mortimer Fries. -Patient intubated on  (07/12/16) -IV  propofol gtt -Currently on Levophed -fever  curve better -Repeat cultures including blood cultures sputum culture no growth from 07/13/2016 -UOP (-) 8.2 liters (not on lasix) -CXR today supine and lying down shows evidence of Pleural effusion. For thoracentesis today  2. Clinical sepsis with Right-sided pneumonia, leukocytosis and tachypnea. IV Vanco and Zosyn --vanco d/ced - repeat blood cultures from 4/27 negative  - sputum cultures negative -on IV solucortef (stress doses)  3. Acute kidney injury due to ATN from pneumonia and sepsis. -creat improving Baseline creat 0.75 in 01/2016 Came in with 2.53--- 2.08--1.10--0.75  4. Acute on chronic COPD on chronic oxygen. -management as above  5.History of obstructive sleep apnea   6.  history of Essential hypertension patient is hypotensive on IV levophed  7. Hypothyroidism    8. Palliative care has seen and met patient and family. -Patient remains DO NOT RESUSCITATE  Overall critically ill  Case discussed with Care Management/Social Worker. Management plans discussed with the patient, family and they are in agreement.  CODE STATUS: DNR  DVT Prophylaxis: lovenox  TOTAL TIME TAKING CARE OF THIS PATIENT: 30 minutes.  >50% time spent on counselling and coordination of careHusband and daughter   Note: This dictation was prepared with Dragon dictation along with smaller phrase technology. Any transcriptional errors that result from this process are unintentional.  PATEL,SONA M.D on 07/17/2016 at 11:46 AM  Between 7am to 6pm - Pager - [phone]  After 6pm go to [email or web address] - password [email or web address] [facility]  Office  [phone]  CC: Primary care physician; Adin Hector, MD

## 2016-07-17 NOTE — Progress Notes (Signed)
Pharmacy Antibiotic Note  Hannah Powell is a 71 y.o. female admitted on 07/02/2016 with  pneumonia.  Pharmacy has been consulted for vancomycin and Zosyn dosing.  Renal function stable, afebrile  Plan: 1. Patient panned cultured. Coverage expanded and will continue Zosyn EI 3.375g IV Q8hr.   2. Continue vancomycin 1g IV Q18hr for goal trough of 15-20. Will obtain follow up trough as clinically indicated.    Height: 5\' 5"  (165.1 cm) Weight: 246 lb 4.1 oz (111.7 kg) IBW/kg (Calculated) : 57  Temp (24hrs), Avg:98.6 F (37 C), Min:97.8 F (36.6 C), Max:99.1 F (37.3 C)   Recent Labs Lab 07/12/16 0534 07/13/16 0434 07/14/16 0515 07/15/16 0422 07/15/16 1209 07/15/16 1717 07/16/16 0443 07/16/16 0958 07/17/16 0424  WBC 8.6 9.2  --   --  12.0*  --   --   --  10.8  CREATININE 0.97 0.95 0.79 0.68  --   --  0.77  --  0.74  VANCOTROUGH  --   --  18  --   --  26*  --   --   --   VANCORANDOM  --   --   --   --   --   --   --  19  --     Estimated Creatinine Clearance: 82.7 mL/min (by C-G formula based on SCr of 0.74 mg/dL).    BMP Latest Ref Rng & Units 07/17/2016 07/16/2016 07/15/2016  Glucose 65 - 99 mg/dL 104(H) 163(H) -  BUN 6 - 20 mg/dL 47(H) 38(H) -  Creatinine 0.44 - 1.00 mg/dL 0.74 0.77 -  Sodium 135 - 145 mmol/L 142 142 -  Potassium 3.5 - 5.1 mmol/L 2.8(L) 2.7(LL) 2.8(L)  Chloride 101 - 111 mmol/L 84(L) 81(L) -  CO2 22 - 32 mmol/L >50(H) >50(H) -  Calcium 8.9 - 10.3 mg/dL 8.3(L) 7.9(L) -   Magnesium (mg/dL)  Date Value  07/17/2016 2.3  01/04/2013 2.0   Phosphorus (mg/dL)  Date Value  07/16/2016 2.7   Albumin (g/dL)  Date Value  07/16/2016 2.3 (L)  06/30/2014 3.8     Allergies  Allergen Reactions  . Lyrica [Pregabalin] Other (See Comments)    Reaction: made Restless Leg Syndrome worse     Antimicrobials this admission: 4/20 ceftriaxone >> 4/20 4/21 levofloxacin >>4/21 4/22 Unasyn >> 4/27   Vancomycin 4/27 >>  Zosyn 4/27 >>   Dose adjustments this  admission:  Microbiology results: 4/17 BCx: STAPHYLOCOCCUS CAPITIS 4/18 BX: NG x 3 days  4/17 Sputum: STREPTOCOCCUS PNEUMONIAE 4/17 MRSA PCR: negative  4/19 BCx: negative 4/27 Sputum: no organisms seen  4/27 UCx: no growth  4/27 BCx: no growth x 4 days   Pharmacy will continue to monitor and adjust per consult.   Loree Fee, PharmD 07/17/2016 10:04 AM

## 2016-07-17 NOTE — Procedures (Signed)
s/p [place] RT THORACENTESIS  No comp Stable 500cc removed and sent to lab cxr pending Full report in PACS

## 2016-07-17 NOTE — Progress Notes (Signed)
Hannah Powell Date of Admission:  07/02/2016     ID: Hannah Powell is a 71 y.o. female with strep PNA and coag neg staph bacteremia Active Problems:   Acute respiratory failure (HCC)   Pressure injury of skin   Community acquired pneumonia of right lung   Palliative care by specialist   Goals of care, counseling/discussion   Pneumonia due to Streptococcus pneumoniae ([place])   Fever   Subjective: Was transferred out of unit but then had decompensation and now intubated 4/26.   ROS  Eleven systems are reviewed and negative except per hpi  Medications:  Antibiotics Given (last 72 hours)    Date/Time Action Medication Dose Rate   07/14/16 2216 New Bag/Given   piperacillin-tazobactam (ZOSYN) IVPB 3.375 g 3.375 g 12.5 mL/hr   07/15/16 0535 New Bag/Given   piperacillin-tazobactam (ZOSYN) IVPB 3.375 g 3.375 g 12.5 mL/hr   07/15/16 0535 New Bag/Given   vancomycin (VANCOCIN) IVPB 1000 mg/200 mL premix 1,000 mg 200 mL/hr   07/15/16 1617 New Bag/Given   piperacillin-tazobactam (ZOSYN) IVPB 3.375 g 3.375 g 12.5 mL/hr   07/15/16 1700 New Bag/Given   vancomycin (VANCOCIN) IVPB 1000 mg/200 mL premix 1,000 mg 200 mL/hr   07/15/16 2150 New Bag/Given   piperacillin-tazobactam (ZOSYN) IVPB 3.375 g 3.375 g 12.5 mL/hr   07/16/16 0550 New Bag/Given   piperacillin-tazobactam (ZOSYN) IVPB 3.375 g 3.375 g 12.5 mL/hr   07/16/16 1324 New Bag/Given   vancomycin (VANCOCIN) IVPB 1000 mg/200 mL premix 1,000 mg 200 mL/hr   07/16/16 1428 New Bag/Given   piperacillin-tazobactam (ZOSYN) IVPB 3.375 g 3.375 g 12.5 mL/hr   07/16/16 2143 New Bag/Given   piperacillin-tazobactam (ZOSYN) IVPB 3.375 g 3.375 g 12.5 mL/hr   07/17/16 0436 New Bag/Given   vancomycin (VANCOCIN) IVPB 1000 mg/200 mL premix 1,000 mg 200 mL/hr   07/17/16 0543 New Bag/Given   piperacillin-tazobactam (ZOSYN) IVPB 3.375 g 3.375 g 12.5 mL/hr   07/17/16 1404 New Bag/Given   piperacillin-tazobactam (ZOSYN)  IVPB 3.375 g 3.375 g 12.5 mL/hr     . aspirin  81 mg Per Tube Daily  . budesonide (PULMICORT) nebulizer solution  0.25 mg Nebulization Q6H  . chlorhexidine gluconate (MEDLINE KIT)  15 mL Mouth Rinse BID  . enoxaparin (LOVENOX) injection  40 mg Subcutaneous Q24H  . hydrocortisone sod succinate (SOLU-CORTEF) inj  50 mg Intravenous Q12H  . insulin aspart  0-15 Units Subcutaneous Q4H  . ipratropium-albuterol  3 mL Nebulization Q6H  . levothyroxine  200 mcg Per Tube QAC breakfast  . mouth rinse  15 mL Mouth Rinse 10 times per day  . pantoprazole sodium  40 mg Per Tube Daily  . potassium chloride  40 mEq Per Tube TID  . pramipexole  4 mg Per Tube QHS  . senna-docusate  2 tablet Per Tube BID  . sodium chloride flush  10-40 mL Intracatheter Q12H    Objective: Vital signs in last 24 hours: Temp:  [98.2 F (36.8 C)-99.1 F (37.3 C)] 98.2 F (36.8 C) (05/01 2000) Pulse Rate:  [60-74] 64 (05/01 2100) Resp:  [11-20] 15 (05/01 2100) BP: (82-201)/(46-97) 109/58 (05/01 2100) SpO2:  [88 %-100 %] 100 % (05/01 2100) FiO2 (%):  [40 %-45 %] 40 % (05/01 2100) Weight:  [111.7 kg (246 lb 4.1 oz)] 111.7 kg (246 lb 4.1 oz) (05/01 0426) Constitutional:  Obese,  Intubated HENT: [place]/AT, PERRLA, no scleral icterus Mouth/Throat: ett in place Cardiovascular: Normal rate, regular rhythm and normal heart sounds. Pulmonary/Chest:  poor air movement, rhonchi Neck = supple, no nuchal rigidity Abdominal: Soft. Bowel sounds are normal.  exhibits no distension. There is no tenderness.  Lymphadenopathy: no cervical adenopathy. No axillary adenopathy Neurological: sedated Skin: Skin is warm and dry. No rash noted. No erythema.  Psychiatric: sedated GU - foley and rectal tube in place.    Lab Results  Recent Labs  07/15/16 1209  07/16/16 0443 07/17/16 0424  WBC 12.0*  --   --  10.8  HGB 12.9  --   --  11.2*  HCT 40.6  --   --  34.4*  NA  --   --  142 142  K  --   < > 2.7* 2.8*  CL  --   --  81* 84*  CO2   --   --  >50* >50*  BUN  --   --  38* 47*  CREATININE  --   --  0.77 0.74  < > = values in this interval not displayed.  Microbiology: Results for orders placed or performed during the hospital encounter of 07/02/16  Blood culture (routine x 2)     Status: Abnormal   Collection Time: 07/02/16 11:47 AM  Result Value Ref Range Status   Specimen Description BLOOD LEFT ARM  Final   Special Requests   Final    BOTTLES DRAWN AEROBIC AND ANAEROBIC Blood Culture results may not be optimal due to an excessive volume of blood received in culture bottles   Culture  Setup Time   Final    GRAM POSITIVE COCCI IN CLUSTERS ANAEROBIC BOTTLE ONLY CRITICAL VALUE NOTED.  VALUE IS CONSISTENT WITH PREVIOUSLY REPORTED AND CALLED VALUE. Performed at [facility], [place] [address] [place], [place] [address]    Culture STAPHYLOCOCCUS CAPITIS (A)  Final   Report Status 07/07/2016 FINAL  Final   Organism ID, Bacteria STAPHYLOCOCCUS CAPITIS  Final      Susceptibility   Staphylococcus capitis - MIC*    CIPROFLOXACIN 4 RESISTANT Resistant     ERYTHROMYCIN >=8 RESISTANT Resistant     GENTAMICIN <=0.5 SENSITIVE Sensitive     OXACILLIN >=4 RESISTANT Resistant     TETRACYCLINE <=1 SENSITIVE Sensitive     VANCOMYCIN <=0.5 SENSITIVE Sensitive     TRIMETH/SULFA <=10 SENSITIVE Sensitive     CLINDAMYCIN INTERMEDIATE Intermediate     RIFAMPIN <=0.5 SENSITIVE Sensitive     Inducible Clindamycin NEGATIVE Sensitive     * STAPHYLOCOCCUS CAPITIS  Blood culture (routine x 2)     Status: Abnormal   Collection Time: 07/02/16 11:47 AM  Result Value Ref Range Status   Specimen Description BLOOD RIGHT ARM  Final   Special Requests   Final    BOTTLES DRAWN AEROBIC AND ANAEROBIC Blood Culture adequate volume   Culture  Setup Time   Final    GRAM POSITIVE COCCI IN BOTH AEROBIC AND ANAEROBIC BOTTLES CRITICAL RESULT CALLED TO, READ BACK BY AND VERIFIED WITH: SHEEMA HALLAJI 07/03/16 @ 36  [place]    Culture (A)  Final     STAPHYLOCOCCUS CAPITIS SUSCEPTIBILITIES PERFORMED ON PREVIOUS CULTURE WITHIN THE LAST 5 DAYS. Performed at [facility], [place] [address] [place], [place] [address]    Report Status 07/06/2016 FINAL  Final  Blood Culture ID Panel (Reflexed)     Status: Abnormal   Collection Time: 07/02/16 11:47 AM  Result Value Ref Range Status   Enterococcus species NOT DETECTED NOT DETECTED Final   Listeria monocytogenes NOT DETECTED NOT  DETECTED Final   Staphylococcus species DETECTED (A) NOT DETECTED Final    Comment: Methicillin (oxacillin) resistant coagulase negative staphylococcus. Possible blood culture contaminant (unless isolated from more than one blood culture draw or clinical case suggests pathogenicity). No antibiotic treatment is indicated for blood  culture contaminants. CRITICAL RESULT CALLED TO, READ BACK BY AND VERIFIED WITH: SHEEMA HALLAJI 07/03/16 @ 1478  [place]    Staphylococcus aureus NOT DETECTED NOT DETECTED Final   Methicillin resistance DETECTED (A) NOT DETECTED Final    Comment: CRITICAL RESULT CALLED TO, READ BACK BY AND VERIFIED WITH: SHEEMA HALLAJI 07/03/16 @ 1725  MLK    Streptococcus species NOT DETECTED NOT DETECTED Final   Streptococcus agalactiae NOT DETECTED NOT DETECTED Final   Streptococcus pneumoniae NOT DETECTED NOT DETECTED Final   Streptococcus pyogenes NOT DETECTED NOT DETECTED Final   Acinetobacter baumannii NOT DETECTED NOT DETECTED Final   Enterobacteriaceae species NOT DETECTED NOT DETECTED Final   Enterobacter cloacae complex NOT DETECTED NOT DETECTED Final   Escherichia coli NOT DETECTED NOT DETECTED Final   Klebsiella oxytoca NOT DETECTED NOT DETECTED Final   Klebsiella pneumoniae NOT DETECTED NOT DETECTED Final   Proteus species NOT DETECTED NOT DETECTED Final   Serratia marcescens NOT DETECTED NOT DETECTED Final   Haemophilus influenzae NOT DETECTED NOT DETECTED Final   Neisseria meningitidis NOT DETECTED NOT DETECTED Final   Pseudomonas  aeruginosa NOT DETECTED NOT DETECTED Final   Candida albicans NOT DETECTED NOT DETECTED Final   Candida glabrata NOT DETECTED NOT DETECTED Final   Candida krusei NOT DETECTED NOT DETECTED Final   Candida parapsilosis NOT DETECTED NOT DETECTED Final   Candida tropicalis NOT DETECTED NOT DETECTED Final  MRSA PCR Screening     Status: None   Collection Time: 07/02/16  3:14 PM  Result Value Ref Range Status   MRSA by PCR NEGATIVE NEGATIVE Final    Comment:        The GeneXpert MRSA Assay (FDA approved for NASAL specimens only), is one component of a comprehensive MRSA colonization surveillance program. It is not intended to diagnose MRSA infection nor to guide or monitor treatment for MRSA infections.   Culture, expectorated sputum-assessment     Status: None   Collection Time: 07/03/16 12:23 AM  Result Value Ref Range Status   Specimen Description SPUTUM  Final   Special Requests NONE  Final   Sputum evaluation THIS SPECIMEN IS ACCEPTABLE FOR SPUTUM CULTURE  Final   Report Status 07/03/2016 FINAL  Final  Culture, respiratory (NON-Expectorated)     Status: None   Collection Time: 07/03/16 12:23 AM  Result Value Ref Range Status   Specimen Description SPUTUM  Final   Special Requests NONE Reflexed from [id number]  Final   Gram Stain   Final    MODERATE WBC PRESENT, PREDOMINANTLY PMN RARE YEAST FEW GRAM POSITIVE COCCI IN PAIRS Performed at [facility], [address], [place], [place] [address]    Culture FEW STREPTOCOCCUS PNEUMONIAE  Final   Report Status 07/05/2016 FINAL  Final   Organism ID, Bacteria STREPTOCOCCUS PNEUMONIAE  Final      Susceptibility   Streptococcus pneumoniae - MIC*    ERYTHROMYCIN >=8 RESISTANT Resistant     LEVOFLOXACIN >=16 RESISTANT Resistant     PENICILLIN (meningitis) <=0.06 SENSITIVE Sensitive     PENICILLIN (non-meningitis) <=0.06 SENSITIVE Sensitive     PENICILLIN (oral) <=0.06 SENSITIVE Sensitive     CEFTRIAXONE (non-meningitis) <=0.12  SENSITIVE Sensitive     CEFTRIAXONE (meningitis) <=  0.12 SENSITIVE Sensitive     * FEW STREPTOCOCCUS PNEUMONIAE  Culture, blood (single) w Reflex to ID Panel     Status: None   Collection Time: 07/05/16 11:23 AM  Result Value Ref Range Status   Specimen Description BLOOD R HAND  Final   Special Requests   Final    BOTTLES DRAWN AEROBIC AND ANAEROBIC Blood Culture adequate volume   Culture NO GROWTH 5 DAYS  Final   Report Status 07/10/2016 FINAL  Final  Urine culture     Status: None   Collection Time: 07/13/16 12:16 AM  Result Value Ref Range Status   Specimen Description URINE, RANDOM  Final   Special Requests NONE  Final   Culture   Final    NO GROWTH Performed at [facility], [place] [address], [place], [place] [address]    Report Status 07/14/2016 FINAL  Final  Culture, blood (Routine X 2) w Reflex to ID Panel     Status: None (Preliminary result)   Collection Time: 07/13/16 12:47 AM  Result Value Ref Range Status   Specimen Description BLOOD RIGHT HAND  Final   Special Requests   Final    BOTTLES DRAWN AEROBIC AND ANAEROBIC Blood Culture adequate volume   Culture NO GROWTH 4 DAYS  Final   Report Status PENDING  Incomplete  Culture, blood (Routine X 2) w Reflex to ID Panel     Status: None (Preliminary result)   Collection Time: 07/13/16 12:47 AM  Result Value Ref Range Status   Specimen Description BLOOD LEFT HAND  Final   Special Requests   Final    BOTTLES DRAWN AEROBIC AND ANAEROBIC Blood Culture adequate volume   Culture NO GROWTH 4 DAYS  Final   Report Status PENDING  Incomplete  Culture, respiratory (NON-Expectorated)     Status: None   Collection Time: 07/13/16  5:15 PM  Result Value Ref Range Status   Specimen Description TRACHEAL ASPIRATE  Final   Special Requests NONE  Final   Gram Stain   Final    ABUNDANT WBC PRESENT,BOTH PMN AND MONONUCLEAR NO ORGANISMS SEEN Performed at [facility], [address] [place], [place] [address]    Culture  MODERATE CANDIDA ALBICANS  Final   Report Status 07/16/2016 FINAL  Final     Studies/Results: Dg Chest 1 View  Result Date: 07/17/2016 CLINICAL DATA:  71 year old female status post thoracentesis. EXAM: CHEST 1 VIEW COMPARISON:  Chest x-ray 07/17/2016. FINDINGS: An endotracheal tube is in place with tip 3.4 cm above the carina. There is a left-sided internal jugular central venous catheter with tip terminating in the mid superior vena cava. A nasogastric tube is seen extending into the stomach, however, the tip of the nasogastric tube extends below the lower margin of the image. Orthopedic fixation hardware in the lower cervical spine. Lung volumes are low. No pneumothorax. Previously noted right-sided pleural effusion has significantly decreased in size, nearly completely resolved. Small left pleural effusion. Patchy multifocal airspace consolidation scattered throughout the lungs bilaterally, similar to recent prior examinations, including a nodular appearing area in the right mid lung. No evidence of pulmonary edema. Heart size is mildly enlarged. Upper mediastinal contours are distorted by patient positioning. Aortic atherosclerosis. IMPRESSION: 1. Decreased size of small right pleural effusion following right-sided thoracentesis. No pneumothorax or other immediate complicating features. 2. Persistent small left pleural effusion. 3. Patchy multifocal interstitial and airspace disease throughout the lungs bilaterally, most compatible with multilobar bronchopneumonia. 4. Mild cardiomegaly.  5. Aortic atherosclerosis. Electronically Signed   By: Vinnie Langton M.D.   On: 07/17/2016 15:15   Dg Chest Port 1 View  Result Date: 07/17/2016 CLINICAL DATA:  Respiratory failure. EXAM: PORTABLE CHEST 1 VIEW COMPARISON:  07/15/2016.  07/13/2016.  CT 07/13/2016. FINDINGS: Endotracheal tube, NG tube, left IJ line stable position. Stable cardiomegaly. Progressive right upper lobe infiltrate and/or edema. Persistent  bibasilar infiltrates and/or edema. Small bilateral pleural effusions. No pneumothorax . Left shoulder replacement. IMPRESSION: 1. Lines and tubes in stable position . 2. Progressive right upper lobe infiltrate. Persistent bibasilar pulmonary infiltrates. Small bilateral pleural effusions. 3.  Cardiomegaly. Electronically Signed   By: Marcello Moores  Register   On: 07/17/2016 06:43   Dg Chest Port 1v Same Day  Result Date: 07/17/2016 CLINICAL DATA:  Pleural effusion EXAM: PORTABLE CHEST 1 VIEW COMPARISON:  07/17/2016 FINDINGS: Cardiac shadow is enlarged but stable. Endotracheal tube, nasogastric catheter and left jugular central line are again seen and stable. Improved aeration is noted bilaterally. Some persistent rounded density is noted in the right mid lung likely related to fluid in the minor fissure. Small pleural effusion is noted. Mild interstitial changes are seen. IMPRESSION: Significantly improved aeration when compared with the prior exam in the right lung. Fluid in the minor fissure remains. Tubes and lines as described. Electronically Signed   By: Inez Catalina M.D.   On: 07/17/2016 11:19   US Thoracentesis Asp Pleural Space W/img Guide  Result Date: 07/17/2016 INDICATION: Respiratory failure, ventilatory support, right pleural effusion EXAM: ULTRASOUND GUIDED RIGHT THORACENTESIS MEDICATIONS: 1% lidocaine locally COMPLICATIONS: None immediate. PROCEDURE: An ultrasound guided thoracentesis was thoroughly discussed with the patient and questions answered. The benefits, risks, alternatives and complications were also discussed. The patient understands and wishes to proceed with the procedure. Written consent was obtained. Ultrasound was performed to localize and mark an adequate pocket of fluid in the right chest. The area was then prepped and draped in the normal sterile fashion. 1% Lidocaine was used for local anesthesia. Under ultrasound guidance a 6 Fr Safe-T-Centesis catheter was introduced.  Thoracentesis was performed. The catheter was removed and a dressing applied. FINDINGS: A total of approximately 500 cc of amber colored pleural fluid was removed. Samples were sent to the laboratory as requested by the clinical team. IMPRESSION: Successful ultrasound guided right thoracentesis yielding 500 cc of pleural fluid. Electronically Signed   By: Jerilynn Mages.  Shick M.D.   On: 07/17/2016 14:34    Assessment/Plan: Breon Diss is a 71 y.o. female with mod-severe COPD on Home O2 admitted with PNA with cx from sputum + Strep PNA and BCX 2/2 + CNS (Staph capitis).  She is on Bipap, wbc improving with ctx and azithromycin. Vanco added 4/19. She has no PPM or intravascular devices. [place] despite being 2/2 + seem to have been drawn at the same time and I suspect they were from the same blood draw. She is not really at risk of coag neg staph bacteremia with no PPM, AICD or Portacath.  FU BCX is negative.   Has had complicated course with intubated 4/26 and fevers 4.28. FU bcx all negative. Broadened from unasyn to vanco and zosyn on 4/27.  S/p thora 5/1 with evidence transudate.  CT shows extensive infection esp on R  Recommendations There is no evidence she needs the broader coverage with vanco and zosyn so can deescalate to ceftraixone or unasyn if ok with Pulm CCM. Continue critical care management- hopefully will have some improvement with removal of pleural  fluid.  I would not suggest targeting treatment for the Coag negative staph so will dc vancomycin  Thank you very much for the consult. Will follow with you.  [place], Watson Robarge P   07/17/2016, 9:52 PM

## 2016-07-17 NOTE — Progress Notes (Signed)
Patient agitated this morning- increased the sedation (fentanyl and propofol) and patient able to follow commands and rest.  Then was able to wean off fentanyl- patient resting comfortably and following commands.  Chest xray worsening from prior- Dr. Alva Garnet stated he may order thoracentesis to drain fluid.

## 2016-07-17 NOTE — Progress Notes (Signed)
Dr. Alva Garnet at bedside- placed patient is spontaneous mode- patient would not initiate breaths on her own. Patient following commands. Patient placed back on PRVC mode.

## 2016-07-17 NOTE — Progress Notes (Signed)
Thoracentesis performed- 581ml of fluid pulled off.  Fentanyl restarted prior to procedure due to patient nodding that she was in pain.  Patient resting comfortably at this time.  Family at bedside.

## 2016-07-17 NOTE — Progress Notes (Signed)
PT PROFILE: 71 y.o. F with moderate to severe COPD, chronic oxygen therapy, moderately impaired at baseline. Admitted 07/02/16 with acute on chronic hypoxemic/hypercarbic respiratory failure due to severe right-sided pneumonia. Pneumococcal urine antigen was positive. Patient was initially supported with noninvasive ventilation. She was transferred out of the intensive care unit on the day following admission but returned shortly thereafter with worsening hypercarbic respiratory failure. Was transferred out and returned once again several days later. Ultimately was intubated 04/26. Has remained ventilator dependent since that time.  SUBJ: Intubated, continuous fentanyl infusion, continuous propofol infusion. Nonetheless, follows commands and appears to understand conversation. No distress on mechanical ventilation  OBJ: Vitals:   07/17/16 0831 07/17/16 0900 07/17/16 1100 07/17/16 1200  BP: 135/65 119/64 (!) 99/52 (!) 103/56  Pulse: 69 63 60 68  Resp:  16 14 17   Temp:    98.9 F (37.2 C)  TempSrc:    Axillary  SpO2:  96% 97% 98%  Weight:      Height:       RASS -1, + F/C NAD, Synchronous with ventilator HEENT: NCAT, sclerae white Neck supple, jugular venous pulsations not well visualized Breath sounds mildly diminished and prolonged, no wheezes heard Cardiac: Regular, no murmurs Abdomen: Obese, soft, bowel sounds present Extremities: Warm, minimal symmetric edema Neuro: No focal deficits noted  BMP Latest Ref Rng & Units 07/17/2016 07/16/2016 07/15/2016  Glucose 65 - 99 mg/dL 104(H) 163(H) -  BUN 6 - 20 mg/dL 47(H) 38(H) -  Creatinine 0.44 - 1.00 mg/dL 0.74 0.77 -  Sodium 135 - 145 mmol/L 142 142 -  Potassium 3.5 - 5.1 mmol/L 2.8(L) 2.7(LL) 2.8(L)  Chloride 101 - 111 mmol/L 84(L) 81(L) -  CO2 22 - 32 mmol/L >50(H) >50(H) -  Calcium 8.9 - 10.3 mg/dL 8.3(L) 7.9(L) -   CBC Latest Ref Rng & Units 07/17/2016 07/15/2016 07/13/2016  WBC 3.6 - 11.0 K/uL 10.8 12.0(H) 9.2  Hemoglobin 12.0 -  16.0 g/dL 11.2(L) 12.9 13.1  Hematocrit 35.0 - 47.0 % 34.4(L) 40.6 40.1  Platelets 150 - 440 K/uL 241 220 154    Results for orders placed or performed during the hospital encounter of 07/02/16  Blood culture (routine x 2)     Status: Abnormal   Collection Time: 07/02/16 11:47 AM  Result Value Ref Range Status   Specimen Description BLOOD LEFT ARM  Final   Special Requests   Final    BOTTLES DRAWN AEROBIC AND ANAEROBIC Blood Culture results may not be optimal due to an excessive volume of blood received in culture bottles   Culture  Setup Time   Final    GRAM POSITIVE COCCI IN CLUSTERS ANAEROBIC BOTTLE ONLY CRITICAL VALUE NOTED.  VALUE IS CONSISTENT WITH PREVIOUSLY REPORTED AND CALLED VALUE. Performed at [facility], [place] [address] [place], [name] [address]    Culture STAPHYLOCOCCUS CAPITIS (A)  Final   Report Status 07/07/2016 FINAL  Final   Organism ID, Bacteria STAPHYLOCOCCUS CAPITIS  Final      Susceptibility   Staphylococcus capitis - MIC*    CIPROFLOXACIN 4 RESISTANT Resistant     ERYTHROMYCIN >=8 RESISTANT Resistant     GENTAMICIN <=0.5 SENSITIVE Sensitive     OXACILLIN >=4 RESISTANT Resistant     TETRACYCLINE <=1 SENSITIVE Sensitive     VANCOMYCIN <=0.5 SENSITIVE Sensitive     TRIMETH/SULFA <=10 SENSITIVE Sensitive     CLINDAMYCIN INTERMEDIATE Intermediate     RIFAMPIN <=0.5 SENSITIVE Sensitive     Inducible Clindamycin NEGATIVE Sensitive     *  STAPHYLOCOCCUS CAPITIS  Blood culture (routine x 2)     Status: Abnormal   Collection Time: 07/02/16 11:47 AM  Result Value Ref Range Status   Specimen Description BLOOD RIGHT ARM  Final   Special Requests   Final    BOTTLES DRAWN AEROBIC AND ANAEROBIC Blood Culture adequate volume   Culture  Setup Time   Final    GRAM POSITIVE COCCI IN BOTH AEROBIC AND ANAEROBIC BOTTLES CRITICAL RESULT CALLED TO, READ BACK BY AND VERIFIED WITH: SHEEMA HALLAJI 07/03/16 @ 36  [place]    Culture (A)  Final    STAPHYLOCOCCUS  CAPITIS SUSCEPTIBILITIES PERFORMED ON PREVIOUS CULTURE WITHIN THE LAST 5 DAYS. Performed at [facility], [place] [address] [place], [place] [address]    Report Status 07/06/2016 FINAL  Final  Blood Culture ID Panel (Reflexed)     Status: Abnormal   Collection Time: 07/02/16 11:47 AM  Result Value Ref Range Status   Enterococcus species NOT DETECTED NOT DETECTED Final   Listeria monocytogenes NOT DETECTED NOT DETECTED Final   Staphylococcus species DETECTED (A) NOT DETECTED Final    Comment: Methicillin (oxacillin) resistant coagulase negative staphylococcus. Possible blood culture contaminant (unless isolated from more than one blood culture draw or clinical case suggests pathogenicity). No antibiotic treatment is indicated for blood  culture contaminants. CRITICAL RESULT CALLED TO, READ BACK BY AND VERIFIED WITH: SHEEMA HALLAJI 07/03/16 @ 8270  [place]    Staphylococcus aureus NOT DETECTED NOT DETECTED Final   Methicillin resistance DETECTED (A) NOT DETECTED Final    Comment: CRITICAL RESULT CALLED TO, READ BACK BY AND VERIFIED WITH: SHEEMA HALLAJI 07/03/16 @ 1725  [facility]    Streptococcus species NOT DETECTED NOT DETECTED Final   Streptococcus agalactiae NOT DETECTED NOT DETECTED Final   Streptococcus pneumoniae NOT DETECTED NOT DETECTED Final   Streptococcus pyogenes NOT DETECTED NOT DETECTED Final   Acinetobacter baumannii NOT DETECTED NOT DETECTED Final   Enterobacteriaceae species NOT DETECTED NOT DETECTED Final   Enterobacter cloacae complex NOT DETECTED NOT DETECTED Final   Escherichia coli NOT DETECTED NOT DETECTED Final   Klebsiella oxytoca NOT DETECTED NOT DETECTED Final   Klebsiella pneumoniae NOT DETECTED NOT DETECTED Final   Proteus species NOT DETECTED NOT DETECTED Final   Serratia marcescens NOT DETECTED NOT DETECTED Final   Haemophilus influenzae NOT DETECTED NOT DETECTED Final   Neisseria meningitidis NOT DETECTED NOT DETECTED Final   Pseudomonas aeruginosa NOT  DETECTED NOT DETECTED Final   Candida albicans NOT DETECTED NOT DETECTED Final   Candida glabrata NOT DETECTED NOT DETECTED Final   Candida krusei NOT DETECTED NOT DETECTED Final   Candida parapsilosis NOT DETECTED NOT DETECTED Final   Candida tropicalis NOT DETECTED NOT DETECTED Final  MRSA PCR Screening     Status: None   Collection Time: 07/02/16  3:14 PM  Result Value Ref Range Status   MRSA by PCR NEGATIVE NEGATIVE Final    Comment:        The GeneXpert MRSA Assay (FDA approved for NASAL specimens only), is one component of a comprehensive MRSA colonization surveillance program. It is not intended to diagnose MRSA infection nor to guide or monitor treatment for MRSA infections.   Culture, expectorated sputum-assessment     Status: None   Collection Time: 07/03/16 12:23 AM  Result Value Ref Range Status   Specimen Description SPUTUM  Final   Special Requests NONE  Final   Sputum evaluation THIS SPECIMEN IS ACCEPTABLE FOR SPUTUM CULTURE  Final  Report Status 07/03/2016 FINAL  Final  Culture, respiratory (NON-Expectorated)     Status: None   Collection Time: 07/03/16 12:23 AM  Result Value Ref Range Status   Specimen Description SPUTUM  Final   Special Requests NONE Reflexed from [id number]  Final   Gram Stain   Final    MODERATE WBC PRESENT, PREDOMINANTLY PMN RARE YEAST FEW GRAM POSITIVE COCCI IN PAIRS Performed at [facility], [address], [place], [place] [address]    Culture FEW STREPTOCOCCUS PNEUMONIAE  Final   Report Status 07/05/2016 FINAL  Final   Organism ID, Bacteria STREPTOCOCCUS PNEUMONIAE  Final      Susceptibility   Streptococcus pneumoniae - MIC*    ERYTHROMYCIN >=8 RESISTANT Resistant     LEVOFLOXACIN >=16 RESISTANT Resistant     PENICILLIN (meningitis) <=0.06 SENSITIVE Sensitive     PENICILLIN (non-meningitis) <=0.06 SENSITIVE Sensitive     PENICILLIN (oral) <=0.06 SENSITIVE Sensitive     CEFTRIAXONE (non-meningitis) <=0.12 SENSITIVE Sensitive      CEFTRIAXONE (meningitis) <=0.12 SENSITIVE Sensitive     * FEW STREPTOCOCCUS PNEUMONIAE  Culture, blood (single) w Reflex to ID Panel     Status: None   Collection Time: 07/05/16 11:23 AM  Result Value Ref Range Status   Specimen Description BLOOD R HAND  Final   Special Requests   Final    BOTTLES DRAWN AEROBIC AND ANAEROBIC Blood Culture adequate volume   Culture NO GROWTH 5 DAYS  Final   Report Status 07/10/2016 FINAL  Final  Urine culture     Status: None   Collection Time: 07/13/16 12:16 AM  Result Value Ref Range Status   Specimen Description URINE, RANDOM  Final   Special Requests NONE  Final   Culture   Final    NO GROWTH Performed at [facility], [place] [address] [place], [place] [address]    Report Status 07/14/2016 FINAL  Final  Culture, blood (Routine X 2) w Reflex to ID Panel     Status: None (Preliminary result)   Collection Time: 07/13/16 12:47 AM  Result Value Ref Range Status   Specimen Description BLOOD RIGHT HAND  Final   Special Requests   Final    BOTTLES DRAWN AEROBIC AND ANAEROBIC Blood Culture adequate volume   Culture NO GROWTH 4 DAYS  Final   Report Status PENDING  Incomplete  Culture, blood (Routine X 2) w Reflex to ID Panel     Status: None (Preliminary result)   Collection Time: 07/13/16 12:47 AM  Result Value Ref Range Status   Specimen Description BLOOD LEFT HAND  Final   Special Requests   Final    BOTTLES DRAWN AEROBIC AND ANAEROBIC Blood Culture adequate volume   Culture NO GROWTH 4 DAYS  Final   Report Status PENDING  Incomplete  Culture, respiratory (NON-Expectorated)     Status: None   Collection Time: 07/13/16  5:15 PM  Result Value Ref Range Status   Specimen Description TRACHEAL ASPIRATE  Final   Special Requests NONE  Final   Gram Stain   Final    ABUNDANT WBC PRESENT,BOTH PMN AND MONONUCLEAR NO ORGANISMS SEEN Performed at [facility], [address], [place], [place] [address]    Culture MODERATE CANDIDA ALBICANS   Final   Report Status 07/16/2016 FINAL  Final   Anti-infectives    Start     Dose/Rate Route Frequency Ordered Stop   07/16/16 1100  vancomycin (VANCOCIN) IVPB 1000 mg/200 mL  premix  Status:  Discontinued     1,000 mg 200 mL/hr over 60 Minutes Intravenous Every 18 hours 07/15/16 1836 07/17/16 1057   07/13/16 1800  vancomycin (VANCOCIN) IVPB 1000 mg/200 mL premix  Status:  Discontinued     1,000 mg 200 mL/hr over 60 Minutes Intravenous Every 12 hours 07/13/16 1721 07/15/16 1836   07/13/16 1200  piperacillin-tazobactam (ZOSYN) IVPB 3.375 g     3.375 g 12.5 mL/hr over 240 Minutes Intravenous Every 8 hours 07/13/16 1050     07/13/16 1100  vancomycin (VANCOCIN) 1,250 mg in sodium chloride 0.9 % 250 mL IVPB     1,250 mg 166.7 mL/hr over 90 Minutes Intravenous  Once 07/13/16 1050 07/13/16 1303   07/08/16 1200  Ampicillin-Sulbactam (UNASYN) 3 g in sodium chloride 0.9 % 100 mL IVPB  Status:  Discontinued     3 g 200 mL/hr over 30 Minutes Intravenous Every 8 hours 07/08/16 1026 07/08/16 1034   07/08/16 1200  Ampicillin-Sulbactam (UNASYN) 3 g in sodium chloride 0.9 % 100 mL IVPB  Status:  Discontinued     3 g 200 mL/hr over 30 Minutes Intravenous Every 6 hours 07/08/16 1034 07/13/16 1050   07/08/16 1000  amoxicillin-clavulanate (AUGMENTIN) 875-125 MG per tablet 1 tablet  Status:  Discontinued     1 tablet Oral Every 12 hours 07/08/16 0955 07/08/16 1026   07/07/16 1000  levofloxacin (LEVAQUIN) tablet 500 mg  Status:  Discontinued     500 mg Oral Daily 07/07/16 0940 07/08/16 0955   07/05/16 1700  vancomycin (VANCOCIN) 1,500 mg in sodium chloride 0.9 % 500 mL IVPB  Status:  Discontinued     1,500 mg 250 mL/hr over 120 Minutes Intravenous Every 24 hours 07/05/16 1444 07/06/16 1436   07/05/16 1030  vancomycin (VANCOCIN) IVPB 1000 mg/200 mL premix     1,000 mg 200 mL/hr over 60 Minutes Intravenous  Once 07/05/16 0928 07/05/16 1200   07/05/16 1000  cefTRIAXone (ROCEPHIN) 1 g in dextrose 5 % 50 mL  IVPB  Status:  Discontinued     1 g 120 mL/hr over 30 Minutes Intravenous Every 24 hours 07/05/16 0942 07/07/16 0940   07/04/16 1500  ceFEPIme (MAXIPIME) 2 g in dextrose 5 % 50 mL IVPB  Status:  Discontinued     2 g 100 mL/hr over 30 Minutes Intravenous Every 12 hours 07/04/16 1346 07/05/16 1032   07/04/16 1000  azithromycin (ZITHROMAX) tablet 250 mg  Status:  Discontinued     250 mg Oral Daily 07/04/16 0742 07/05/16 0942   07/02/16 1800  ceFEPIme (MAXIPIME) 1 GM / 37mL IVPB premix  Status:  Discontinued     1 g 100 mL/hr over 30 Minutes Intravenous Every 24 hours 07/02/16 1342 07/02/16 1342   07/02/16 1800  ceFEPIme (MAXIPIME) 2 g in dextrose 5 % 50 mL IVPB  Status:  Discontinued     2 g 100 mL/hr over 30 Minutes Intravenous Every 24 hours 07/02/16 1343 07/04/16 1346   07/02/16 1400  vancomycin (VANCOCIN) 500 mg in sodium chloride 0.9 % 100 mL IVPB     500 mg 100 mL/hr over 60 Minutes Intravenous  Once 07/02/16 1337 07/02/16 1935   07/02/16 1300  azithromycin (ZITHROMAX) 500 mg in dextrose 5 % 250 mL IVPB  Status:  Discontinued     500 mg 250 mL/hr over 60 Minutes Intravenous Daily 07/02/16 1242 07/04/16 0742   07/02/16 1215  vancomycin (VANCOCIN) IVPB 1000 mg/200 mL premix     1,000 mg  200 mL/hr over 60 Minutes Intravenous  Once 07/02/16 1211 07/02/16 1333   07/02/16 1215  piperacillin-tazobactam (ZOSYN) IVPB 3.375 g     3.375 g 100 mL/hr over 30 Minutes Intravenous  Once 07/02/16 1212 07/02/16 1256     CXR: increased opacification/GGO on R. R sided opacity cleared completely with upright film - suggests layering pleural effusion  IMPRESSION: Acute on chronic hypoxemic, hypercarbic respiratory failure Ventilator status Moderate to severe COPD at baseline Pneumococcal pneumonia - resolving R lung infiltrate R pleural effusion Resolved bronchospasm Hypotension - sedation induced Acute delirium - controlled Hypokalemia, recurrent  PLAN/REC: Cont vent support - settings  reviewed and/or adjusted Wean in PSV as tolerated Cont vent bundle Daily SBT if/when meets criteria Continue nebulized steroids and bronchodilators Thoracentesis requested Monitor temp, WBC count Micro and abx as above Monitor BMET intermittently Monitor I/Os Correct electrolytes as indicated Sliding scale insulin regimen adjusted Continue PAD protocol Wean norepinephrine to off for map greater than 65 mmHg Husband and daughter updated in detail at the bedside  CCM time: 35 mins The above time includes time spent in consultation with patient and/or family members and reviewing care plan on multidisciplinary rounds  Merton Border, MD PCCM service Mobile [phone] Pager [phone]  07/17/2016 12:47 PM

## 2016-07-18 ENCOUNTER — Inpatient Hospital Stay: Payer: Medicare HMO

## 2016-07-18 LAB — CULTURE, BLOOD (ROUTINE X 2)
CULTURE: NO GROWTH
Culture: NO GROWTH
SPECIAL REQUESTS: ADEQUATE
Special Requests: ADEQUATE

## 2016-07-18 LAB — GLUCOSE, CAPILLARY
GLUCOSE-CAPILLARY: 107 mg/dL — AB (ref 65–99)
GLUCOSE-CAPILLARY: 119 mg/dL — AB (ref 65–99)
Glucose-Capillary: 108 mg/dL — ABNORMAL HIGH (ref 65–99)
Glucose-Capillary: 92 mg/dL (ref 65–99)
Glucose-Capillary: 107 mg/dL — ABNORMAL HIGH (ref 65–99)
Glucose-Capillary: 97 mg/dL (ref 65–99)

## 2016-07-18 LAB — BASIC METABOLIC PANEL
Anion gap: 10 (ref 5–15)
BUN: 39 mg/dL — AB (ref 6–20)
CHLORIDE: 85 mmol/L — AB (ref 101–111)
CO2: 48 mmol/L — AB (ref 22–32)
CREATININE: 0.57 mg/dL (ref 0.44–1.00)
Calcium: 8.4 mg/dL — ABNORMAL LOW (ref 8.9–10.3)
GFR calc Af Amer: >60 mL/min (ref >60)
GFR calc non Af Amer: >60 mL/min (ref >60)
GLUCOSE: 114 mg/dL — AB (ref 65–99)
POTASSIUM: 3.3 mmol/L — AB (ref 3.5–5.1)
SODIUM: 143 mmol/L (ref 135–145)

## 2016-07-18 LAB — MISC LABCORP TEST (SEND OUT): LABCORP TEST CODE: 19588

## 2016-07-18 MED ORDER — KCL-LACTATED RINGERS-D5W 20 MEQ/L IV SOLN
INTRAVENOUS | Status: DC
  Administered 2016-07-18 – 2016-07-19 (×2): via INTRAVENOUS
  Filled 2016-07-18 (×3): qty 1000

## 2016-07-18 MED ORDER — FENTANYL CITRATE (PF) 100 MCG/2ML IJ SOLN: 12.5000 ug | INTRAMUSCULAR | Status: DC | PRN

## 2016-07-18 MED ORDER — LEVOTHYROXINE SODIUM 200 MCG PO TABS
200.0000 ug | ORAL_TABLET | Freq: Every day | ORAL | Status: DC
  Administered 2016-07-19 – 2016-07-24 (×6): 200 ug via ORAL
  Filled 2016-07-18 (×6): qty 1

## 2016-07-18 MED ORDER — ONDANSETRON HCL 4 MG/2ML IJ SOLN
INTRAMUSCULAR | Status: AC
  Administered 2016-07-18: 4 mg via INTRAVENOUS
  Filled 2016-07-18: qty 2

## 2016-07-18 MED ORDER — ONDANSETRON HCL 4 MG/2ML IJ SOLN
4.0000 mg | Freq: Four times a day (QID) | INTRAMUSCULAR | Status: DC | PRN
  Administered 2016-07-18 – 2016-07-19 (×3): 4 mg via INTRAVENOUS
  Filled 2016-07-18 (×3): qty 2

## 2016-07-18 MED ORDER — ASPIRIN 81 MG PO CHEW
81.0000 mg | CHEWABLE_TABLET | Freq: Every day | ORAL | Status: DC
  Administered 2016-07-18 – 2016-07-24 (×7): 81 mg via ORAL
  Filled 2016-07-18 (×6): qty 1

## 2016-07-18 MED ORDER — ONDANSETRON HCL 4 MG PO TABS
4.0000 mg | ORAL_TABLET | Freq: Four times a day (QID) | ORAL | Status: DC | PRN
  Administered 2016-07-18: 4 mg via ORAL
  Filled 2016-07-18: qty 1

## 2016-07-18 MED ORDER — PRAMIPEXOLE DIHYDROCHLORIDE 1.5 MG PO TABS
4.0000 mg | ORAL_TABLET | Freq: Every day | ORAL | Status: DC
  Administered 2016-07-18 – 2016-07-22 (×5): 4 mg via ORAL
  Filled 2016-07-18: qty 1
  Filled 2016-07-18: qty 4
  Filled 2016-07-18 (×3): qty 1
  Filled 2016-07-18: qty 4
  Filled 2016-07-18: qty 1
  Filled 2016-07-18: qty 4

## 2016-07-18 MED ORDER — DEXTROSE 5 % IV SOLN
2.0000 g | INTRAVENOUS | Status: DC
  Filled 2016-07-18: qty 2

## 2016-07-18 MED ORDER — ALPRAZOLAM 0.5 MG PO TABS
0.2500 mg | ORAL_TABLET | Freq: Three times a day (TID) | ORAL | Status: DC | PRN
  Administered 2016-07-18 – 2016-07-24 (×6): 0.25 mg via ORAL
  Filled 2016-07-18 (×6): qty 1

## 2016-07-18 MED ORDER — METOCLOPRAMIDE HCL 5 MG/ML IJ SOLN
5.0000 mg | Freq: Once | INTRAMUSCULAR | Status: AC
  Administered 2016-07-18: 5 mg via INTRAVENOUS
  Filled 2016-07-18: qty 2

## 2016-07-18 MED ORDER — ORAL CARE MOUTH RINSE
15.0000 mL | Freq: Two times a day (BID) | OROMUCOSAL | Status: DC
  Administered 2016-07-18 – 2016-07-20 (×5): 15 mL via OROMUCOSAL

## 2016-07-18 NOTE — Progress Notes (Addendum)
Patient tolerating 4 liters well, tends to desat when coughing but recovers well, pt has strong productive cough and uses the [place] independently.   Foley appears to be leaking around insertion site.  Writer injected additional sterile NS into the balloon.  Follow up for further leakage.  May need to discontinue foley.

## 2016-07-18 NOTE — Progress Notes (Signed)
Pt tolerated bed to chair position approx 2 hours, and dangling approx 30 minutes.  Some nausea post extubation, no emesis, O2 sats low - mid 90s on 4 liters, she desats with exertion or coughing, but recovers quickly.  Physical Therapy deferred by writer until tomorrow per Dr Alva Garnet comment to me.  She will also have SLP tomorrow.  Tolerating ice chips and water and ginger ale very well w/o s/sx of aspiration.

## 2016-07-18 NOTE — Progress Notes (Signed)
PT PROFILE: 71 y.o. F with moderate to severe COPD, chronic oxygen therapy, moderately impaired at baseline. Admitted 07/02/16 with acute on chronic hypoxemic/hypercarbic respiratory failure due to severe right-sided pneumonia. Pneumococcal urine antigen was positive. Patient was initially supported with noninvasive ventilation. She was transferred out of the intensive care unit on the day following admission but returned shortly thereafter with worsening hypercarbic respiratory failure. Was transferred out and returned once again several days later. Ultimately was intubated 04/26. Has remained ventilator dependent since that time.  SUBJ: RASS 0. + F/C. Passed SBT and extubated under my direction. Tolerating so far. 4 LPM [place]  OBJ: Vitals:   07/18/16 0900 07/18/16 1000 07/18/16 1031 07/18/16 1100  BP: (!) 146/91 128/72  134/73  Pulse: 97 88  91  Resp: (!) 22 17  14   Temp:      TempSrc:      SpO2: (!) 88% 97% 100% (!) 87%  Weight:      Height:       RASS 0, + F/C NAD, Synchronous with ventilator HEENT: NCAT, sclerae white Neck supple, jugular venous pulsations not well visualized Breath sounds mildly diminished, scattered wheezes Cardiac: Regular, no murmurs Abdomen: Obese, soft, bowel sounds present Extremities: Warm, minimal symmetric edema Neuro: No focal deficits noted  BMP Latest Ref Rng & Units 07/18/2016 07/17/2016 07/16/2016  Glucose 65 - 99 mg/dL 114(H) 104(H) 163(H)  BUN 6 - 20 mg/dL 39(H) 47(H) 38(H)  Creatinine 0.44 - 1.00 mg/dL 0.57 0.74 0.77  Sodium 135 - 145 mmol/L 143 142 142  Potassium 3.5 - 5.1 mmol/L 3.3(L) 2.8(L) 2.7(LL)  Chloride 101 - 111 mmol/L 85(L) 84(L) 81(L)  CO2 22 - 32 mmol/L 48(H) >50(H) >50(H)  Calcium 8.9 - 10.3 mg/dL 8.4(L) 8.3(L) 7.9(L)   CBC Latest Ref Rng & Units 07/17/2016 07/15/2016 07/13/2016  WBC 3.6 - 11.0 K/uL 10.8 12.0(H) 9.2  Hemoglobin 12.0 - 16.0 g/dL 11.2(L) 12.9 13.1  Hematocrit 35.0 - 47.0 % 34.4(L) 40.6 40.1  Platelets 150 - 440 K/uL  241 220 154    Results for orders placed or performed during the hospital encounter of 07/02/16  Blood culture (routine x 2)     Status: Abnormal   Collection Time: 07/02/16 11:47 AM  Result Value Ref Range Status   Specimen Description BLOOD LEFT ARM  Final   Special Requests   Final    BOTTLES DRAWN AEROBIC AND ANAEROBIC Blood Culture results may not be optimal due to an excessive volume of blood received in culture bottles   Culture  Setup Time   Final    GRAM POSITIVE COCCI IN CLUSTERS ANAEROBIC BOTTLE ONLY CRITICAL VALUE NOTED.  VALUE IS CONSISTENT WITH PREVIOUSLY REPORTED AND CALLED VALUE. Performed at [facility], [place] [address], [place], [name] [address]    Culture STAPHYLOCOCCUS CAPITIS (A)  Final   Report Status 07/07/2016 FINAL  Final   Organism ID, Bacteria STAPHYLOCOCCUS CAPITIS  Final      Susceptibility   Staphylococcus capitis - MIC*    CIPROFLOXACIN 4 RESISTANT Resistant     ERYTHROMYCIN >=8 RESISTANT Resistant     GENTAMICIN <=0.5 SENSITIVE Sensitive     OXACILLIN >=4 RESISTANT Resistant     TETRACYCLINE <=1 SENSITIVE Sensitive     VANCOMYCIN <=0.5 SENSITIVE Sensitive     TRIMETH/SULFA <=10 SENSITIVE Sensitive     CLINDAMYCIN INTERMEDIATE Intermediate     RIFAMPIN <=0.5 SENSITIVE Sensitive     Inducible Clindamycin NEGATIVE Sensitive     * STAPHYLOCOCCUS CAPITIS  Blood culture (routine x 2)     Status: Abnormal   Collection Time: 07/02/16 11:47 AM  Result Value Ref Range Status   Specimen Description BLOOD RIGHT ARM  Final   Special Requests   Final    BOTTLES DRAWN AEROBIC AND ANAEROBIC Blood Culture adequate volume   Culture  Setup Time   Final    GRAM POSITIVE COCCI IN BOTH AEROBIC AND ANAEROBIC BOTTLES CRITICAL RESULT CALLED TO, READ BACK BY AND VERIFIED WITH: SHEEMA HALLAJI 07/03/16 @ 71  [place]    Culture (A)  Final    STAPHYLOCOCCUS CAPITIS SUSCEPTIBILITIES PERFORMED ON PREVIOUS CULTURE WITHIN THE LAST 5 DAYS. Performed at [facility], [place] [address] [place], [place] [address]    Report Status 07/06/2016 FINAL  Final  Blood Culture ID Panel (Reflexed)     Status: Abnormal   Collection Time: 07/02/16 11:47 AM  Result Value Ref Range Status   Enterococcus species NOT DETECTED NOT DETECTED Final   Listeria monocytogenes NOT DETECTED NOT DETECTED Final   Staphylococcus species DETECTED (A) NOT DETECTED Final    Comment: Methicillin (oxacillin) resistant coagulase negative staphylococcus. Possible blood culture contaminant (unless isolated from more than one blood culture draw or clinical case suggests pathogenicity). No antibiotic treatment is indicated for blood  culture contaminants. CRITICAL RESULT CALLED TO, READ BACK BY AND VERIFIED WITH: SHEEMA HALLAJI 07/03/16 @ 1914  [place]    Staphylococcus aureus NOT DETECTED NOT DETECTED Final   Methicillin resistance DETECTED (A) NOT DETECTED Final    Comment: CRITICAL RESULT CALLED TO, READ BACK BY AND VERIFIED WITH: SHEEMA HALLAJI 07/03/16 @ 1725  MLK    Streptococcus species NOT DETECTED NOT DETECTED Final   Streptococcus agalactiae NOT DETECTED NOT DETECTED Final   Streptococcus pneumoniae NOT DETECTED NOT DETECTED Final   Streptococcus pyogenes NOT DETECTED NOT DETECTED Final   Acinetobacter baumannii NOT DETECTED NOT DETECTED Final   Enterobacteriaceae species NOT DETECTED NOT DETECTED Final   Enterobacter cloacae complex NOT DETECTED NOT DETECTED Final   Escherichia coli NOT DETECTED NOT DETECTED Final   Klebsiella oxytoca NOT DETECTED NOT DETECTED Final   Klebsiella pneumoniae NOT DETECTED NOT DETECTED Final   Proteus species NOT DETECTED NOT DETECTED Final   Serratia marcescens NOT DETECTED NOT DETECTED Final   Haemophilus influenzae NOT DETECTED NOT DETECTED Final   Neisseria meningitidis NOT DETECTED NOT DETECTED Final   Pseudomonas aeruginosa NOT DETECTED NOT DETECTED Final   Candida albicans NOT DETECTED NOT DETECTED Final   Candida glabrata NOT DETECTED  NOT DETECTED Final   Candida krusei NOT DETECTED NOT DETECTED Final   Candida parapsilosis NOT DETECTED NOT DETECTED Final   Candida tropicalis NOT DETECTED NOT DETECTED Final  MRSA PCR Screening     Status: None   Collection Time: 07/02/16  3:14 PM  Result Value Ref Range Status   MRSA by PCR NEGATIVE NEGATIVE Final    Comment:        The GeneXpert MRSA Assay (FDA approved for NASAL specimens only), is one component of a comprehensive MRSA colonization surveillance program. It is not intended to diagnose MRSA infection nor to guide or monitor treatment for MRSA infections.   Culture, expectorated sputum-assessment     Status: None   Collection Time: 07/03/16 12:23 AM  Result Value Ref Range Status   Specimen Description SPUTUM  Final   Special Requests NONE  Final   Sputum evaluation THIS SPECIMEN IS ACCEPTABLE FOR SPUTUM CULTURE  Final   Report Status  07/03/2016 FINAL  Final  Culture, respiratory (NON-Expectorated)     Status: None   Collection Time: 07/03/16 12:23 AM  Result Value Ref Range Status   Specimen Description SPUTUM  Final   Special Requests NONE Reflexed from [id number]  Final   Gram Stain   Final    MODERATE WBC PRESENT, PREDOMINANTLY PMN RARE YEAST FEW GRAM POSITIVE COCCI IN PAIRS Performed at [facility], [address] [place], [place] [address]    Culture FEW STREPTOCOCCUS PNEUMONIAE  Final   Report Status 07/05/2016 FINAL  Final   Organism ID, Bacteria STREPTOCOCCUS PNEUMONIAE  Final      Susceptibility   Streptococcus pneumoniae - MIC*    ERYTHROMYCIN >=8 RESISTANT Resistant     LEVOFLOXACIN >=16 RESISTANT Resistant     PENICILLIN (meningitis) <=0.06 SENSITIVE Sensitive     PENICILLIN (non-meningitis) <=0.06 SENSITIVE Sensitive     PENICILLIN (oral) <=0.06 SENSITIVE Sensitive     CEFTRIAXONE (non-meningitis) <=0.12 SENSITIVE Sensitive     CEFTRIAXONE (meningitis) <=0.12 SENSITIVE Sensitive     * FEW STREPTOCOCCUS PNEUMONIAE  Culture, blood  (single) w Reflex to ID Panel     Status: None   Collection Time: 07/05/16 11:23 AM  Result Value Ref Range Status   Specimen Description BLOOD R HAND  Final   Special Requests   Final    BOTTLES DRAWN AEROBIC AND ANAEROBIC Blood Culture adequate volume   Culture NO GROWTH 5 DAYS  Final   Report Status 07/10/2016 FINAL  Final  Urine culture     Status: None   Collection Time: 07/13/16 12:16 AM  Result Value Ref Range Status   Specimen Description URINE, RANDOM  Final   Special Requests NONE  Final   Culture   Final    NO GROWTH Performed at [facility], [place] [address] [place], [place] [address]    Report Status 07/14/2016 FINAL  Final  Culture, blood (Routine X 2) w Reflex to ID Panel     Status: None   Collection Time: 07/13/16 12:47 AM  Result Value Ref Range Status   Specimen Description BLOOD RIGHT HAND  Final   Special Requests   Final    BOTTLES DRAWN AEROBIC AND ANAEROBIC Blood Culture adequate volume   Culture NO GROWTH 5 DAYS  Final   Report Status 07/18/2016 FINAL  Final  Culture, blood (Routine X 2) w Reflex to ID Panel     Status: None   Collection Time: 07/13/16 12:47 AM  Result Value Ref Range Status   Specimen Description BLOOD LEFT HAND  Final   Special Requests   Final    BOTTLES DRAWN AEROBIC AND ANAEROBIC Blood Culture adequate volume   Culture NO GROWTH 5 DAYS  Final   Report Status 07/18/2016 FINAL  Final  Culture, respiratory (NON-Expectorated)     Status: None   Collection Time: 07/13/16  5:15 PM  Result Value Ref Range Status   Specimen Description TRACHEAL ASPIRATE  Final   Special Requests NONE  Final   Gram Stain   Final    ABUNDANT WBC PRESENT,BOTH PMN AND MONONUCLEAR NO ORGANISMS SEEN Performed at [facility], [place] [address] [place], [place] [address]    Culture MODERATE CANDIDA ALBICANS  Final   Report Status 07/16/2016 FINAL  Final  Body fluid culture     Status: None (Preliminary result)   Collection Time: 07/17/16  2:24  PM  Result Value Ref Range Status   Specimen Description PLEURAL  Final   Special Requests NONE  Final   Gram Stain   Final    RARE WBC PRESENT, PREDOMINANTLY MONONUCLEAR NO ORGANISMS SEEN    Culture   Final    NO GROWTH < 24 HOURS Performed at [facility] [address] [place], [place] [address]    Report Status PENDING  Incomplete   Anti-infectives    Start     Dose/Rate Route Frequency Ordered Stop   07/18/16 1000  cefTRIAXone (ROCEPHIN) 2 g in dextrose 5 % 50 mL IVPB  Status:  Discontinued     2 g 100 mL/hr over 30 Minutes Intravenous Every 24 hours 07/18/16 0604 07/18/16 0835   07/16/16 1100  vancomycin (VANCOCIN) IVPB 1000 mg/200 mL premix  Status:  Discontinued     1,000 mg 200 mL/hr over 60 Minutes Intravenous Every 18 hours 07/15/16 1836 07/17/16 1057   07/13/16 1800  vancomycin (VANCOCIN) IVPB 1000 mg/200 mL premix  Status:  Discontinued     1,000 mg 200 mL/hr over 60 Minutes Intravenous Every 12 hours 07/13/16 1721 07/15/16 1836   07/13/16 1200  piperacillin-tazobactam (ZOSYN) IVPB 3.375 g  Status:  Discontinued     3.375 g 12.5 mL/hr over 240 Minutes Intravenous Every 8 hours 07/13/16 1050 07/18/16 0559   07/13/16 1100  vancomycin (VANCOCIN) 1,250 mg in sodium chloride 0.9 % 250 mL IVPB     1,250 mg 166.7 mL/hr over 90 Minutes Intravenous  Once 07/13/16 1050 07/13/16 1303   07/08/16 1200  Ampicillin-Sulbactam (UNASYN) 3 g in sodium chloride 0.9 % 100 mL IVPB  Status:  Discontinued     3 g 200 mL/hr over 30 Minutes Intravenous Every 8 hours 07/08/16 1026 07/08/16 1034   07/08/16 1200  Ampicillin-Sulbactam (UNASYN) 3 g in sodium chloride 0.9 % 100 mL IVPB  Status:  Discontinued     3 g 200 mL/hr over 30 Minutes Intravenous Every 6 hours 07/08/16 1034 07/13/16 1050   07/08/16 1000  amoxicillin-clavulanate (AUGMENTIN) 875-125 MG per tablet 1 tablet  Status:  Discontinued     1 tablet Oral Every 12 hours 07/08/16 0955 07/08/16 1026   07/07/16 1000  levofloxacin  (LEVAQUIN) tablet 500 mg  Status:  Discontinued     500 mg Oral Daily 07/07/16 0940 07/08/16 0955   07/05/16 1700  vancomycin (VANCOCIN) 1,500 mg in sodium chloride 0.9 % 500 mL IVPB  Status:  Discontinued     1,500 mg 250 mL/hr over 120 Minutes Intravenous Every 24 hours 07/05/16 1444 07/06/16 1436   07/05/16 1030  vancomycin (VANCOCIN) IVPB 1000 mg/200 mL premix     1,000 mg 200 mL/hr over 60 Minutes Intravenous  Once 07/05/16 0928 07/05/16 1200   07/05/16 1000  cefTRIAXone (ROCEPHIN) 1 g in dextrose 5 % 50 mL IVPB  Status:  Discontinued     1 g 120 mL/hr over 30 Minutes Intravenous Every 24 hours 07/05/16 0942 07/07/16 0940   07/04/16 1500  ceFEPIme (MAXIPIME) 2 g in dextrose 5 % 50 mL IVPB  Status:  Discontinued     2 g 100 mL/hr over 30 Minutes Intravenous Every 12 hours 07/04/16 1346 07/05/16 1032   07/04/16 1000  azithromycin (ZITHROMAX) tablet 250 mg  Status:  Discontinued     250 mg Oral Daily 07/04/16 0742 07/05/16 0942   07/02/16 1800  ceFEPIme (MAXIPIME) 1 GM / 74mL IVPB premix  Status:  Discontinued     1 g 100 mL/hr over 30 Minutes Intravenous Every 24 hours 07/02/16  1342 07/02/16 1342   07/02/16 1800  ceFEPIme (MAXIPIME) 2 g in dextrose 5 % 50 mL IVPB  Status:  Discontinued     2 g 100 mL/hr over 30 Minutes Intravenous Every 24 hours 07/02/16 1343 07/04/16 1346   07/02/16 1400  vancomycin (VANCOCIN) 500 mg in sodium chloride 0.9 % 100 mL IVPB     500 mg 100 mL/hr over 60 Minutes Intravenous  Once 07/02/16 1337 07/02/16 1935   07/02/16 1300  azithromycin (ZITHROMAX) 500 mg in dextrose 5 % 250 mL IVPB  Status:  Discontinued     500 mg 250 mL/hr over 60 Minutes Intravenous Daily 07/02/16 1242 07/04/16 0742   07/02/16 1215  vancomycin (VANCOCIN) IVPB 1000 mg/200 mL premix     1,000 mg 200 mL/hr over 60 Minutes Intravenous  Once 07/02/16 1211 07/02/16 1333   07/02/16 1215  piperacillin-tazobactam (ZOSYN) IVPB 3.375 g     3.375 g 100 mL/hr over 30 Minutes Intravenous  Once  07/02/16 1212 07/02/16 1256     CXR: increased opacification/GGO on R. R sided opacity cleared completely with upright film - suggests layering pleural effusion  IMPRESSION: Acute on chronic hypoxemic, hypercarbic respiratory failure Moderate to severe COPD at baseline Pneumococcal pneumonia - nearly fully resolved R lung infiltrate R pleural effusion - S/P thoracentesis 05/01. 500cc removed. Fluid c/w transudate Hypotension - resolved Acute delirium - resolved Hypokalemia, recurrent  PLAN/REC: Monitor in ICU post extubation Supplemental O2 to keep saturation greater than 90% Continue nebulized steroids and bronchodilators Monitor temp, WBC count Micro and abx as above - all antibiotics discontinued 05/02 Monitor BMET intermittently Monitor I/Os Correct electrolytes as indicated Continue sliding scale insulin  Husband and daughter updated in detail at the bedside  CCM time: 35 mins The above time includes time spent in consultation with patient and/or family members, reviewing care plan on multidisciplinary rounds and several rechecks after extubation  Merton Border, MD PCCM service Mobile [phone] Pager [phone]  07/18/2016 12:12 PM

## 2016-07-18 NOTE — Progress Notes (Signed)
Per NP Hinton Dyer order zofran for patient

## 2016-07-18 NOTE — Progress Notes (Signed)
SLP Cancellation Note  Patient Details Name: Nyala Kirchner MRN: [id number] DOB: 11-17-1945   Cancelled treatment:       Reason Eval/Treat Not Completed:  (order received to initiate 07/19/16. Chart reviewed.).  NSG consulted. SLP recommended frequent oral care for hygiene and stimulation for swallowing today; possible few ice chips (post oral care) if appropriate and per MD ok - again, to stimulate swallowing. ST services will f/u w/ BSE on 07/19/16. NSG to contact w/ any needs.   Orinda Kenner, [place], CCC-SLP Kaheem Halleck 07/18/2016, 9:33 AM

## 2016-07-18 NOTE — Progress Notes (Signed)
Pharmacy Antibiotic Note  Hannah Powell is a 71 y.o. female admitted on 07/02/2016 with  pneumonia.  Pharmacy has been consulted for vancomycin and Zosyn dosing.  Renal function stable, afebrile  Plan: 1. Patient panned cultured. Coverage expanded and will continue Zosyn EI 3.375g IV Q8hr.   2. Continue vancomycin 1g IV Q18hr for goal trough of 15-20. Will obtain follow up trough as clinically indicated.   5/2 Abx changed to ceftriaxone. 2 gram q 24 hours ordered.   Height: 5\' 5"  (165.1 cm) Weight: 245 lb 2.4 oz (111.2 kg) IBW/kg (Calculated) : 57  Temp (24hrs), Avg:98.6 F (37 C), Min:98.2 F (36.8 C), Max:98.9 F (37.2 C)   Recent Labs Lab 07/12/16 0534 07/13/16 0434 07/14/16 0515 07/15/16 0422 07/15/16 1209 07/15/16 1717 07/16/16 0443 07/16/16 0958 07/17/16 0424  WBC 8.6 9.2  --   --  12.0*  --   --   --  10.8  CREATININE 0.97 0.95 0.79 0.68  --   --  0.77  --  0.74  VANCOTROUGH  --   --  18  --   --  26*  --   --   --   VANCORANDOM  --   --   --   --   --   --   --  19  --     Estimated Creatinine Clearance: 82.5 mL/min (by C-G formula based on SCr of 0.74 mg/dL).    BMP Latest Ref Rng & Units 07/17/2016 07/16/2016 07/15/2016  Glucose 65 - 99 mg/dL 104(H) 163(H) -  BUN 6 - 20 mg/dL 47(H) 38(H) -  Creatinine 0.44 - 1.00 mg/dL 0.74 0.77 -  Sodium 135 - 145 mmol/L 142 142 -  Potassium 3.5 - 5.1 mmol/L 2.8(L) 2.7(LL) 2.8(L)  Chloride 101 - 111 mmol/L 84(L) 81(L) -  CO2 22 - 32 mmol/L >50(H) >50(H) -  Calcium 8.9 - 10.3 mg/dL 8.3(L) 7.9(L) -   Magnesium (mg/dL)  Date Value  07/17/2016 2.3  01/04/2013 2.0   Phosphorus (mg/dL)  Date Value  07/16/2016 2.7   Albumin (g/dL)  Date Value  07/16/2016 2.3 (L)  06/30/2014 3.8     Allergies  Allergen Reactions  . Lyrica [Pregabalin] Other (See Comments)    Reaction: made Restless Leg Syndrome worse     Antimicrobials this admission: 4/20 ceftriaxone >> 4/20 4/21 levofloxacin >>4/21 4/22 Unasyn >> 4/27    Vancomycin 4/27 >>  Zosyn 4/27 >> ceftriaxone 5/2  Dose adjustments this admission:  Microbiology results: 4/17 BCx: STAPHYLOCOCCUS CAPITIS 4/18 BX: NG x 3 days  4/17 Sputum: STREPTOCOCCUS PNEUMONIAE 4/17 MRSA PCR: negative  4/19 BCx: negative 4/27 Sputum: no organisms seen  4/27 UCx: no growth  4/27 BCx: no growth x 4 days   Pharmacy will continue to monitor and adjust per consult.   Tamella Tuccillo S, PharmD 07/18/2016 6:04 AM

## 2016-07-18 NOTE — Progress Notes (Signed)
Pt is in no distress. Pt does not wish to wear Bipap. Pt sates that the Dr. Rockey Situ her it was only for an emergency and as needed.

## 2016-07-18 NOTE — Progress Notes (Signed)
Nutrition Follow-up  DOCUMENTATION CODES:   Morbid obesity  INTERVENTION:  1. Diet advancement per MD/NP/PA  NUTRITION DIAGNOSIS:   Inadequate oral intake related to acute illness as evidenced by NPO status. -ongoing  GOAL:   Patient will meet greater than or equal to 90% of their needs -new goal, not meeting yet  MONITOR:   I & O's, Diet advancement, Labs, Supplement acceptance  ASSESSMENT:   71 yo female admitted with acute respiratory failure and sepsis with pneumonia on 4/16, on and off Bipap, ultimately requiring intubation on 4/26.  Pt with hx of CHF, COPD, HTN  Patient extubated this morning to 4L [place] Monitor for diet advancement, BSE by SLP tomorrow Needs re-estimated Labs and medications reviewed: K 3.3 D5 LR w/ KCL 64mEq @ 52mL/hr --> 204 calories  Intake/Output Summary (Last 24 hours) at 07/18/16 1119 Last data filed at 07/18/16 1100  Gross per 24 hour  Intake          2421.07 ml  Output             6165 ml  Net         -3743.93 ml   Diet Order:  Diet NPO time specified Except for: Ice Chips, Sips with Meds  Skin:  Wound (see comment) (stage II sacrum)  Last BM:  07/18/2016  Height:   Ht Readings from Last 1 Encounters:  07/08/16 5\' 5"  (1.651 m)    Weight:   Wt Readings from Last 1 Encounters:  07/18/16 245 lb 2.4 oz (111.2 kg)    Ideal Body Weight:  56.81 kg  BMI:  Body mass index is 40.8 kg/m.  Estimated Nutritional Needs:   Kcal:  1700-2100 calories (25-30 cal/kg ABW)  Protein:  60-75 gm  Fluid:  >/= 1.7L  EDUCATION NEEDS:   No education needs identified at this time  Hannah Powell. Hannah Kleckley, MS, RD LDN Inpatient Clinical Dietitian Pager [phone]

## 2016-07-18 NOTE — Progress Notes (Signed)
[facility] NAME: Hannah Powell    MR#:  [id number]  DATE OF BIRTH:  Nov 07, 1945  SUBJECTIVE:  Patient got intubated on 07/12/16.---extubated on 07/18/16 No fever Awake doing well. Very hungry  REVIEW OF SYSTEMS:   Review of Systems  Unable to perform ROS: Intubated   DRUG ALLERGIES:   Allergies  Allergen Reactions  . Lyrica [Pregabalin] Other (See Comments)    Reaction: made Restless Leg Syndrome worse     VITALS:  Blood pressure 118/65, pulse 88, temperature 98.2 F (36.8 C), temperature source Axillary, resp. rate 20, height '5\' 5"'  (1.651 m), weight 111.2 kg (245 lb 2.4 oz), SpO2 92 %.  PHYSICAL EXAMINATION:   Physical Exam  GENERAL:  71 y.o.-year-old patient lying in the bed with moderate  distress. ObeseCritically ill  EYES: Pupils equal, round, reactive to light and accommodation. No scleral icterus. Extraocular muscles intact.  HEENT: Head atraumatic, normocephalic. Oropharynx and nasopharynx clear. -NECK  Supple, no jugular venous distention. No thyroid enlargement, no tenderness.  LUNGS:distant breath sounds bilaterally, no wheezing,++ rales,no rhonchi. Shallow breathing CARDIOVASCULAR: S1, S2 normal. No murmurs, rubs, or gallops.  ABDOMEN: Soft, nontender, nondistended. Bowel sounds present. No organomegaly or mass.  EXTREMITIES: No cyanosis, clubbing  ++ edema b/l.    NEUROLOGIC: weak Grossly nonfocal moves all her extremities well Psychiatric: Alert and oriented 3  SKIN: No obvious rash, lesion, or ulcer.   LABORATORY PANEL:  CBC  Recent Labs Lab 07/17/16 0424  WBC 10.8  HGB 11.2*  HCT 34.4*  PLT 241    Chemistries   Recent Labs Lab 07/17/16 0424 07/18/16 0827  NA 142 143  K 2.8* 3.3*  CL 84* 85*  CO2 >50* 48*  GLUCOSE 104* 114*  BUN 47* 39*  CREATININE 0.74 0.57  CALCIUM 8.3* 8.4*  MG 2.3  --    Cardiac Enzymes No results for input(s): TROPONINI in the last 168  hours. RADIOLOGY:  Dg Chest 1 View  Result Date: 07/17/2016 CLINICAL DATA:  71 year old female status post thoracentesis. EXAM: CHEST 1 VIEW COMPARISON:  Chest x-ray 07/17/2016. FINDINGS: An endotracheal tube is in place with tip 3.4 cm above the carina. There is a left-sided internal jugular central venous catheter with tip terminating in the mid superior vena cava. A nasogastric tube is seen extending into the stomach, however, the tip of the nasogastric tube extends below the lower margin of the image. Orthopedic fixation hardware in the lower cervical spine. Lung volumes are low. No pneumothorax. Previously noted right-sided pleural effusion has significantly decreased in size, nearly completely resolved. Small left pleural effusion. Patchy multifocal airspace consolidation scattered throughout the lungs bilaterally, similar to recent prior examinations, including a nodular appearing area in the right mid lung. No evidence of pulmonary edema. Heart size is mildly enlarged. Upper mediastinal contours are distorted by patient positioning. Aortic atherosclerosis. IMPRESSION: 1. Decreased size of small right pleural effusion following right-sided thoracentesis. No pneumothorax or other immediate complicating features. 2. Persistent small left pleural effusion. 3. Patchy multifocal interstitial and airspace disease throughout the lungs bilaterally, most compatible with multilobar bronchopneumonia. 4. Mild cardiomegaly. 5. Aortic atherosclerosis. Electronically Signed   By: Vinnie Langton M.D.   On: 07/17/2016 15:15   Dg Chest Port 1 View  Result Date: 07/18/2016 CLINICAL DATA:  Respiratory failure. EXAM: PORTABLE CHEST 1 VIEW COMPARISON:  07/17/2016. FINDINGS: Endotracheal tube, NG tube, left IJ line stable position. Stable cardiomegaly. Patchy right upper lobe and bibasilar infiltrates  again noted. Small bilateral pleural effusions again noted. No pneumothorax. Cervical spine fusion. Left shoulder  replacement. IMPRESSION: 1. Lines and tubes in stable position. 2. Patchy right upper lobe and bibasilar infiltrates and small pleural effusions. No change. 3. Stable cardiomegaly . Electronically Signed   By: Marcello Moores  Register   On: 07/18/2016 06:45   Dg Chest Port 1 View  Result Date: 07/17/2016 CLINICAL DATA:  Respiratory failure. EXAM: PORTABLE CHEST 1 VIEW COMPARISON:  07/15/2016.  07/13/2016.  CT 07/13/2016. FINDINGS: Endotracheal tube, NG tube, left IJ line stable position. Stable cardiomegaly. Progressive right upper lobe infiltrate and/or edema. Persistent bibasilar infiltrates and/or edema. Small bilateral pleural effusions. No pneumothorax . Left shoulder replacement. IMPRESSION: 1. Lines and tubes in stable position . 2. Progressive right upper lobe infiltrate. Persistent bibasilar pulmonary infiltrates. Small bilateral pleural effusions. 3.  Cardiomegaly. Electronically Signed   By: Marcello Moores  Register   On: 07/17/2016 06:43   Dg Chest Port 1v Same Day  Result Date: 07/17/2016 CLINICAL DATA:  Pleural effusion EXAM: PORTABLE CHEST 1 VIEW COMPARISON:  07/17/2016 FINDINGS: Cardiac shadow is enlarged but stable. Endotracheal tube, nasogastric catheter and left jugular central line are again seen and stable. Improved aeration is noted bilaterally. Some persistent rounded density is noted in the right mid lung likely related to fluid in the minor fissure. Small pleural effusion is noted. Mild interstitial changes are seen. IMPRESSION: Significantly improved aeration when compared with the prior exam in the right lung. Fluid in the minor fissure remains. Tubes and lines as described. Electronically Signed   By: Inez Catalina M.D.   On: 07/17/2016 11:19   US Thoracentesis Asp Pleural Space W/img Guide  Result Date: 07/17/2016 INDICATION: Respiratory failure, ventilatory support, right pleural effusion EXAM: ULTRASOUND GUIDED RIGHT THORACENTESIS MEDICATIONS: 1% lidocaine locally COMPLICATIONS: None  immediate. PROCEDURE: An ultrasound guided thoracentesis was thoroughly discussed with the patient and questions answered. The benefits, risks, alternatives and complications were also discussed. The patient understands and wishes to proceed with the procedure. Written consent was obtained. Ultrasound was performed to localize and mark an adequate pocket of fluid in the right chest. The area was then prepped and draped in the normal sterile fashion. 1% Lidocaine was used for local anesthesia. Under ultrasound guidance a 6 Fr Safe-T-Centesis catheter was introduced. Thoracentesis was performed. The catheter was removed and a dressing applied. FINDINGS: A total of approximately 500 cc of amber colored pleural fluid was removed. Samples were sent to the laboratory as requested by the clinical team. IMPRESSION: Successful ultrasound guided right thoracentesis yielding 500 cc of pleural fluid. Electronically Signed   By: Jerilynn Mages.  Shick M.D.   On: 07/17/2016 14:34   ASSESSMENT AND PLAN:  [name] a 71 y.o.femalepresented with respiratory distress. She stated on Thursday she started feeling bad. She needed to increase her oxygen from 2 L to 3 L. On Saturday she had shaking chills and fever.   1. Acute on chronic hypoxic/hypercarbic respiratory failure with hypoxia and acidosis.  -Appears combination of severe COPD and CHF acute diastolic (Echo in the past EF 65%) -Patient continued to worsen despite aggressive medical management. -family had discussion with DR Mortimer Fries. -Patient intubated on  (07/12/16)---now extubated 07/18/2016 -Repeat cultures including blood cultures sputum culture no growth from 07/13/2016 -UOP (-) 8.2 liters (not on lasix) -CXR today supine and lying down shows evidence of Pleural effusion. -Status post thoracentesis 07/17/2016 which are more of 500 cc of fluid  2. Clinical sepsis with Right-sided pneumonia, leukocytosis and tachypnea. IV Vanco  and Zosyn --vanco d/ced. Patient  currently not on any antibiotics - repeat blood cultures from 4/27 negative  - sputum cultures negative -on IV solucortef (stress doses)  3. Acute kidney injury due to ATN from pneumonia and sepsis. -creat improving Baseline creat 0.75 in 01/2016 Came in with 2.53--- 2.08--1.10--0.75  4. Acute on chronic COPD on chronic oxygen. -management as above  5.History of obstructive sleep apnea   6.  history of Essential hypertension patient is hypotensive on IV levophed  7. Hypothyroidism    8. Palliative care has seen and met patient and family. -Patient remains DO NOT RESUSCITATE  Case discussed with Care Management/Social Worker. Management plans discussed with the patient, family and they are in agreement.  CODE STATUS: DNR  DVT Prophylaxis: lovenox  TOTAL TIME TAKING CARE OF THIS PATIENT: 30 minutes.  >50% time spent on counselling and coordination of careHusband and daughter   Note: This dictation was prepared with Dragon dictation along with smaller phrase technology. Any transcriptional errors that result from this process are unintentional.  Jasilyn Holderman M.D on 07/18/2016 at 3:33 PM  Between 7am to 6pm - Pager - [phone]  After 6pm go to [email or web address] - password [email or web address] [facility]  Office  [phone]  CC: Primary care physician; Adin Hector, MD

## 2016-07-18 NOTE — Progress Notes (Signed)
PT Cancellation Note  Patient Details Name: Hannah Powell MRN: [id number] DOB: May 20, 1945   Cancelled Treatment:    Reason Eval/Treat Not Completed: Patient not medically ready. PT discussed with RN, per her conversation with MD earlier today this order should have read "start tomorrow" (5/3). Will hold on mobility re-evaluation today and check on her tomorrow.   Royce Macadamia PT, DPT, CSCS    07/18/2016, 4:22 PM

## 2016-07-18 NOTE — Progress Notes (Signed)
Patient placed on PSV 5 peep of 5 by physician at [facility].  Patient assessed at [phone] and was tolerating well so patient was extubated at this time and placed on 4lpm [place].  Patient doing well HR 91 saturations 93% RR 20.

## 2016-07-19 DIAGNOSIS — E873 Alkalosis: Secondary | ICD-10-CM

## 2016-07-19 DIAGNOSIS — R918 Other nonspecific abnormal finding of lung field: Secondary | ICD-10-CM

## 2016-07-19 LAB — BASIC METABOLIC PANEL
ANION GAP: 5 (ref 5–15)
Anion gap: 4 — ABNORMAL LOW (ref 5–15)
BUN: 27 mg/dL — ABNORMAL HIGH (ref 6–20)
BUN: 25 mg/dL — ABNORMAL HIGH (ref 6–20)
CALCIUM: 8.4 mg/dL — AB (ref 8.9–10.3)
CHLORIDE: 92 mmol/L — AB (ref 101–111)
CHLORIDE: 95 mmol/L — AB (ref 101–111)
CO2: 50 mmol/L — AB (ref 22–32)
CO2: 48 mmol/L — AB (ref 22–32)
CREATININE: 0.5 mg/dL (ref 0.44–1.00)
Calcium: 8.9 mg/dL (ref 8.9–10.3)
Creatinine, Ser: 0.49 mg/dL (ref 0.44–1.00)
GFR calc non Af Amer: >60 mL/min (ref >60)
GFR calc non Af Amer: >60 mL/min (ref >60)
GLUCOSE: 97 mg/dL (ref 65–99)
Glucose, Bld: 146 mg/dL — ABNORMAL HIGH (ref 65–99)
POTASSIUM: 3.5 mmol/L (ref 3.5–5.1)
Potassium: 2.8 mmol/L — ABNORMAL LOW (ref 3.5–5.1)
SODIUM: 148 mmol/L — AB (ref 135–145)
Sodium: 146 mmol/L — ABNORMAL HIGH (ref 135–145)

## 2016-07-19 LAB — CBC
HCT: 33.1 % — ABNORMAL LOW (ref 35.0–47.0)
Hemoglobin: 10.9 g/dL — ABNORMAL LOW (ref 12.0–16.0)
MCH: 30.5 pg (ref 26.0–34.0)
MCHC: 32.8 g/dL (ref 32.0–36.0)
MCV: 93.2 fL (ref 80.0–100.0)
PLATELETS: 303 10*3/uL (ref 150–440)
RBC: 3.56 MIL/uL — AB (ref 3.80–5.20)
RDW: 15.8 % — ABNORMAL HIGH (ref 11.5–14.5)
WBC: 7.3 10*3/uL (ref 3.6–11.0)

## 2016-07-19 LAB — GLUCOSE, CAPILLARY
GLUCOSE-CAPILLARY: 80 mg/dL (ref 65–99)
GLUCOSE-CAPILLARY: 148 mg/dL — AB (ref 65–99)
GLUCOSE-CAPILLARY: 77 mg/dL (ref 65–99)
Glucose-Capillary: 106 mg/dL — ABNORMAL HIGH (ref 65–99)
Glucose-Capillary: 93 mg/dL (ref 65–99)

## 2016-07-19 LAB — CYTOLOGY - NON PAP

## 2016-07-19 LAB — MAGNESIUM: Magnesium: 1.9 mg/dL (ref 1.7–2.4)

## 2016-07-19 MED ORDER — STERILE WATER FOR INJECTION IJ SOLN
INTRAMUSCULAR | Status: AC
  Administered 2016-07-19: 10 mL
  Filled 2016-07-19: qty 10

## 2016-07-19 MED ORDER — PROMETHAZINE HCL 25 MG/ML IJ SOLN
12.5000 mg | INTRAMUSCULAR | Status: DC | PRN
  Administered 2016-07-19 – 2016-07-23 (×8): 12.5 mg via INTRAVENOUS
  Filled 2016-07-19 (×8): qty 1

## 2016-07-19 MED ORDER — POTASSIUM CHLORIDE 10 MEQ/100ML IV SOLN
10.0000 meq | INTRAVENOUS | Status: DC
  Filled 2016-07-19 (×6): qty 100

## 2016-07-19 MED ORDER — FAMOTIDINE 20 MG PO TABS
20.0000 mg | ORAL_TABLET | Freq: Two times a day (BID) | ORAL | Status: DC
  Administered 2016-07-19 – 2016-07-24 (×9): 20 mg via ORAL
  Filled 2016-07-19 (×10): qty 1

## 2016-07-19 MED ORDER — ACETAZOLAMIDE SODIUM 500 MG IJ SOLR
500.0000 mg | Freq: Once | INTRAMUSCULAR | Status: AC
  Administered 2016-07-19: 500 mg via INTRAVENOUS
  Filled 2016-07-19: qty 500

## 2016-07-19 MED ORDER — FAMOTIDINE IN NACL 20-0.9 MG/50ML-% IV SOLN
20.0000 mg | INTRAVENOUS | Status: DC
  Administered 2016-07-19: 20 mg via INTRAVENOUS
  Filled 2016-07-19: qty 50

## 2016-07-19 MED ORDER — POTASSIUM CHLORIDE 10 MEQ/100ML IV SOLN: 10.0000 meq | INTRAVENOUS | Status: DC

## 2016-07-19 MED ORDER — GUAIFENESIN-DM 100-10 MG/5ML PO SYRP
5.0000 mL | ORAL_SOLUTION | Freq: Four times a day (QID) | ORAL | Status: DC | PRN
  Administered 2016-07-19 – 2016-07-22 (×3): 5 mL via ORAL
  Filled 2016-07-19 (×2): qty 5

## 2016-07-19 MED ORDER — POTASSIUM CHLORIDE 2 MEQ/ML IV SOLN
INTRAVENOUS | Status: DC
  Administered 2016-07-19 (×2): via INTRAVENOUS
  Filled 2016-07-19 (×6): qty 1000

## 2016-07-19 MED ORDER — POTASSIUM CHLORIDE 10 MEQ/100ML IV SOLN
10.0000 meq | INTRAVENOUS | Status: AC
  Administered 2016-07-19 (×6): 10 meq via INTRAVENOUS
  Filled 2016-07-19 (×6): qty 100

## 2016-07-19 MED ORDER — PANTOPRAZOLE SODIUM 40 MG IV SOLR: 40.0000 mg | Freq: Once | INTRAVENOUS | Status: DC

## 2016-07-19 MED ORDER — SODIUM CHLORIDE 0.9 % IJ SOLN
INTRAMUSCULAR | Status: AC
  Administered 2016-07-19: 10 mL
  Filled 2016-07-19: qty 10

## 2016-07-19 MED ORDER — INSULIN ASPART 100 UNIT/ML ~~LOC~~ SOLN
0.0000 [IU] | Freq: Three times a day (TID) | SUBCUTANEOUS | Status: DC
  Administered 2016-07-20 – 2016-07-24 (×8): 2 [IU] via SUBCUTANEOUS
  Filled 2016-07-19: qty 1
  Filled 2016-07-19 (×4): qty 2
  Filled 2016-07-19: qty 1
  Filled 2016-07-19: qty 2
  Filled 2016-07-19: qty 1
  Filled 2016-07-19: qty 2

## 2016-07-19 MED ORDER — ZOLPIDEM TARTRATE 5 MG PO TABS
5.0000 mg | ORAL_TABLET | Freq: Once | ORAL | Status: AC
  Administered 2016-07-19: 5 mg via ORAL
  Filled 2016-07-19: qty 1

## 2016-07-19 MED ORDER — INSULIN ASPART 100 UNIT/ML ~~LOC~~ SOLN: 0.0000 [IU] | Freq: Every day | SUBCUTANEOUS | Status: DC

## 2016-07-19 MED ORDER — TRAZODONE HCL 100 MG PO TABS
100.0000 mg | ORAL_TABLET | Freq: Every day | ORAL | Status: DC
  Administered 2016-07-19 – 2016-07-23 (×5): 100 mg via ORAL
  Filled 2016-07-19: qty 1
  Filled 2016-07-19: qty 2
  Filled 2016-07-19: qty 1
  Filled 2016-07-19: qty 2
  Filled 2016-07-19: qty 1

## 2016-07-19 MED ORDER — PREDNISONE 20 MG PO TABS
40.0000 mg | ORAL_TABLET | Freq: Every day | ORAL | Status: DC
  Administered 2016-07-20 – 2016-07-24 (×5): 40 mg via ORAL
  Filled 2016-07-19 (×5): qty 2

## 2016-07-19 NOTE — Progress Notes (Signed)
CONCERNING: IV to Oral Route Change Policy  RECOMMENDATION: This patient is receiving famotidne by the intravenous route.  Based on criteria approved by the Pharmacy and Therapeutics Committee, the intravenous medication(s) is/are being converted to the equivalent oral dose form(s).   DESCRIPTION: These criteria include:  The patient is eating (either orally or via tube) and/or has been taking other orally administered medications for a least 24 hours  The patient has no evidence of active gastrointestinal bleeding or impaired GI absorption (gastrectomy, short bowel, patient on TNA or NPO).  If you have questions about this conversion, please contact the Pharmacy Department  []   [phone] )  [facility] [x]   [phone] )  [facility] []   [phone] )  [facility] []   [phone] )  [facility] []   [phone] )  [place], [facility] 07/19/2016 4:18 PM

## 2016-07-19 NOTE — Progress Notes (Signed)
Hannah Powell NAME: Hannah Powell    MR#:  [id number]  DATE OF BIRTH:  07-27-1945  SUBJECTIVE:  Patient got intubated on 07/12/16.---extubated on 07/18/16  Afebrile Getting better. Weak. Nausea but no vomiting  REVIEW OF SYSTEMS:   Review of Systems  Unable to perform ROS: Intubated   DRUG ALLERGIES:   Allergies  Allergen Reactions  . Lyrica [Pregabalin] Other (See Comments)    Reaction: made Restless Leg Syndrome worse     VITALS:  Blood pressure (!) 143/73, pulse 86, temperature 98.7 F (37.1 C), temperature source Axillary, resp. rate 20, height 5\' 5"  (1.651 m), weight 111.2 kg (245 lb 2.4 oz), SpO2 91 %.  PHYSICAL EXAMINATION:   Physical Exam  GENERAL:  71 y.o.-year-old patient lying in the bed with moderate  distress. Obese. EYES: Pupils equal, round, reactive to light and accommodation. No scleral icterus. Extraocular muscles intact.  HEENT: Head atraumatic, normocephalic. Oropharynx and nasopharynx clear. -NECK  Supple, no jugular venous distention. No thyroid enlargement, no tenderness.  LUNGS:distant breath sounds bilaterally, no wheezing, + rales,no rhonchi. Shallow breathing CARDIOVASCULAR: S1, S2 normal. No murmurs, rubs, or gallops.  ABDOMEN: Soft, nontender, nondistended. Bowel sounds present. No organomegaly or mass.  EXTREMITIES: No cyanosis, clubbing  + edema b/l.    NEUROLOGIC: weak Grossly nonfocal moves all her extremities well Psychiatric: Alert and oriented 3  SKIN: No obvious rash, lesion, or ulcer.   LABORATORY PANEL:  CBC  Recent Labs Lab 07/19/16 0522  WBC 7.3  HGB 10.9*  HCT 33.1*  PLT 303    Chemistries   Recent Labs Lab 07/17/16 0424  07/19/16 0522  NA 142  < > 146*  K 2.8*  < > 2.8*  CL 84*  < > 92*  CO2 >50*  < > 50*  GLUCOSE 104*  < > 97  BUN 47*  < > 27*  CREATININE 0.74  < > 0.50  CALCIUM 8.3*  < > 8.4*  MG 2.3  --   --   < > = values in this interval not  displayed. Cardiac Enzymes No results for input(s): TROPONINI in the last 168 hours. RADIOLOGY:  Dg Chest 1 View  Result Date: 07/17/2016 CLINICAL DATA:  72 year old female status post thoracentesis. EXAM: CHEST 1 VIEW COMPARISON:  Chest x-ray 07/17/2016. FINDINGS: An endotracheal tube is in place with tip 3.4 cm above the carina. There is a left-sided internal jugular central venous catheter with tip terminating in the mid superior vena cava. A nasogastric tube is seen extending into the stomach, however, the tip of the nasogastric tube extends below the lower margin of the image. Orthopedic fixation hardware in the lower cervical spine. Lung volumes are low. No pneumothorax. Previously noted right-sided pleural effusion has significantly decreased in size, nearly completely resolved. Small left pleural effusion. Patchy multifocal airspace consolidation scattered throughout the lungs bilaterally, similar to recent prior examinations, including a nodular appearing area in the right mid lung. No evidence of pulmonary edema. Heart size is mildly enlarged. Upper mediastinal contours are distorted by patient positioning. Aortic atherosclerosis. IMPRESSION: 1. Decreased size of small right pleural effusion following right-sided thoracentesis. No pneumothorax or other immediate complicating features. 2. Persistent small left pleural effusion. 3. Patchy multifocal interstitial and airspace disease throughout the lungs bilaterally, most compatible with multilobar bronchopneumonia. 4. Mild cardiomegaly. 5. Aortic atherosclerosis. Electronically Signed   By: Vinnie Langton M.D.   On: 07/17/2016 15:15   Dg Chest [facility]  1 View  Result Date: 07/18/2016 CLINICAL DATA:  Respiratory failure. EXAM: PORTABLE CHEST 1 VIEW COMPARISON:  07/17/2016. FINDINGS: Endotracheal tube, NG tube, left IJ line stable position. Stable cardiomegaly. Patchy right upper lobe and bibasilar infiltrates again noted. Small bilateral pleural  effusions again noted. No pneumothorax. Cervical spine fusion. Left shoulder replacement. IMPRESSION: 1. Lines and tubes in stable position. 2. Patchy right upper lobe and bibasilar infiltrates and small pleural effusions. No change. 3. Stable cardiomegaly . Electronically Signed   By: Marcello Moores  Register   On: 07/18/2016 06:45   US Thoracentesis Asp Pleural Space W/img Guide  Result Date: 07/17/2016 INDICATION: Respiratory failure, ventilatory support, right pleural effusion EXAM: ULTRASOUND GUIDED RIGHT THORACENTESIS MEDICATIONS: 1% lidocaine locally COMPLICATIONS: None immediate. PROCEDURE: An ultrasound guided thoracentesis was thoroughly discussed with the patient and questions answered. The benefits, risks, alternatives and complications were also discussed. The patient understands and wishes to proceed with the procedure. Written consent was obtained. Ultrasound was performed to localize and mark an adequate pocket of fluid in the right chest. The area was then prepped and draped in the normal sterile fashion. 1% Lidocaine was used for local anesthesia. Under ultrasound guidance a 6 Fr Safe-T-Centesis catheter was introduced. Thoracentesis was performed. The catheter was removed and a dressing applied. FINDINGS: A total of approximately 500 cc of amber colored pleural fluid was removed. Samples were sent to the laboratory as requested by the clinical team. IMPRESSION: Successful ultrasound guided right thoracentesis yielding 500 cc of pleural fluid. Electronically Signed   By: Jerilynn Mages.  Shick M.D.   On: 07/17/2016 14:34   ASSESSMENT AND PLAN:  Hannah Powell a 71 y.o.femalepresented with respiratory distress. She stated on Thursday she started feeling bad. She needed to increase her oxygen from 2 L to 3 L. On Saturday she had shaking chills and fever.   1. Acute on chronic hypoxic/hypercarbic respiratory failure with hypoxia and acidosis.  -Appears combination of severe COPD and CHF acute diastolic (Echo  in the past EF 65%) -Patient continued to worsen despite aggressive medical management. -family had discussion with Dr Mortimer Fries. -Patient intubated on  (07/12/16)---now extubated 07/18/2016. On 4 L O2 -Repeat cultures including blood cultures sputum culture no growth from 07/13/2016 -Status post thoracentesis 07/17/2016 which are more of 500 cc of fluid  2. Severe sepsis with Right-sided pneumonia, leukocytosis and tachypnea. IV Vanco and Zosyn. Patient currently not on any antibiotics - repeat blood cultures from 4/27 negative  - sputum cultures negative -was on IV solucortef (stress doses) - Stopped  3. Acute kidney injury due to ATN from pneumonia and sepsis. -creatinine improving Baseline creat 0.75 in 01/2016 Came in with 2.53--- 2.08--1.10-->0.75  4. Acute on chronic COPD on chronic oxygen. -management as above  5.History of obstructive sleep apnea   6.  History of Essential hypertension patient was hypotensive on IV levophed. Add meds slowly  7. Hypothyroidism    8. Palliative care on board -Patient remains DO NOT RESUSCITATE  Discussed with patient and husband at bedside.  CODE STATUS: DNR  DVT Prophylaxis: lovenox  TOTAL TIME TAKING CARE OF THIS PATIENT: 30 minutes.    Hillary Bow R M.D on 07/19/2016 at 11:19 AM  Between 7am to 6pm - Pager - [phone]  After 6pm go to [email or web address] - password [email or web address] [facility]  Office  [phone]  CC: Primary care physician; Adin Hector, MD   Note: This dictation was prepared with Dragon dictation along with smaller phrase technology. Any transcriptional errors that  result from this process are unintentional.

## 2016-07-19 NOTE — Progress Notes (Signed)
Patient requests NO VISITORS other than her husband and children.  If her husband is here he will speak any visitors and let us know if they can come back to her room.  Unit secretary informed.

## 2016-07-19 NOTE — Progress Notes (Signed)
eLink Physician-Brief Progress Note Patient Name: Hannah Powell DOB: May 21, 1945 MRN: [id number]   Date of Service  07/19/2016  HPI/Events of Note  Patient now on diet. Request to change from Q 4 hour moderate Novolog SSI to AC/HS moderate Novolog SSI  eICU Interventions  Will order: 1. D/C Q 4 hour moderate Novolog SSI. 2. AC/HS moderate Novolog SSI.     Intervention Category Major Interventions: Hyperglycemia - active titration of insulin therapy  Sommer,Steven Eugene 07/19/2016, 4:02 PM

## 2016-07-19 NOTE — Clinical Social Work Note (Signed)
CSW has noted that patient is clinically improving and that PT reassessment is pending. CSW will follow up with patient again once PT reassessment has been completed. Shela Leff MSW,LCSW [phone]

## 2016-07-19 NOTE — Progress Notes (Signed)
PT PROFILE: 71 y.o. F with moderate to severe COPD, chronic oxygen therapy, moderately impaired at baseline. Admitted 07/02/16 with acute on chronic hypoxemic/hypercarbic respiratory failure due to severe right-sided pneumonia. Pneumococcal urine antigen was positive. Patient was initially supported with noninvasive ventilation. She was transferred out of the intensive care unit on the day following admission but returned shortly thereafter with worsening hypercarbic respiratory failure. Was transferred out and returned once again several days later. Ultimately was intubated 04/26 and extubated 05/2  SUBJ: Pt primarily c/o persistent nausea with epigastric pain.  She is currently on 4L O2 via nasal canula with no c/o sob, however she does have periods of wheezing   PHYSICAL EXAMINATION: General: well developed, well nourished, NAD  Neuro: No focal deficits, cranial nerves intact, follows commands  HEENT: NCAT, sclerae white Cardiovascular: nsr, s1s2, no M/R/G Lungs: rhonchi throughout, even, non labored Abdomen: Obese, soft, hypoactive BS x4, epigastric tenderness, non distended  Extremities: Warm, no edema Skin: no rashes or lesions, scattered ecchymosis   LABS: Reviewed   CXR: increased opacification/GGO on R. R sided opacity cleared completely with upright film - suggests layering pleural effusion  IMPRESSION: Acute on chronic hypoxemic, hypercarbic respiratory failure-improving Moderate to severe COPD at baseline Severe metabolic alkalosis secondary to loop diuresis  Pneumococcal pneumonia - nearly fully resolved R lung infiltrate R pleural effusion - S/P thoracentesis 05/01. 500cc removed. Fluid c/w transudate Hypotension - resolved Acute delirium - resolved Hypokalemia, recurrent  PLAN/REC: Supplemental O2 to keep saturation greater than 90% Diamox for 1 more dose today for metabolic alkalosis  Repeat BMP this afternoon  Continue nebulized steroids and  bronchodilators Monitor temp, WBC count Micro and abx as above - all antibiotics discontinued 05/02 Monitor BMET intermittently Monitor I/Os Correct electrolytes as indicated Continue sliding scale insulin  Husband and daughter updated in detail at the bedside Physical therapy consulted Frequent OOB to chair as tolerated   -Will keep pt in Stepdown Unit today 07/19/16 if pt remains stable overnight will transfer to medsurg unit tomorrow 07/20/16  Marda Stalker, [place] Pager [phone] (please enter 7 digits) PCCM Consult Pager [phone] (please enter 7 digits)  PCCM ATTENDING ATTESTATION:  I have evaluated patient with the APP [place], reviewed database in its entirety and discussed care plan in detail. In addition, this patient was discussed on multidisciplinary rounds.  Agree with above. Replete KCl aggressively. Acetazolamide X one dose. Monitor in ICU/SDU through today  Merton Border, MD PCCM service Mobile [phone] Pager [phone] 07/19/2016 1:47 PM

## 2016-07-19 NOTE — Evaluation (Signed)
Physical Therapy Re-Evaluation Patient Details Name: Hannah Powell MRN: [id number] DOB: 1946/03/16 Today's Date: 07/19/2016   History of Present Illness  Pt admitted to the CCU 07/02/16 for acute respiratory failure with hypoxia on continuous BiPAP. Pt moved to med surg on 07/03/16 on 3L O2. Pt moved to CCU on 07/04/16 after RR called for acute on chronic hypercapnic respiratory failure requiring continuous BiPAP. Pt returned to med surg on 4/21, but moved back to CCU on 4/22 for increased respiratory distress with hypoxemia and back on BiPAP. Pt intubated on 07/12/16 and extubated on 07/18/16. New PT orders recieved on 07/18/16 for re-eval and pt currently on 4L O2. Pt has PMH of anxiety, arthritis, CHF, COPD, HTN, hypothyroidism, restless leg syndrome, sleep apnea, s/p back surgery (cervical), foot arthroplasty, L total shoulder arthroplasty, and TKR. Pt wears 3L of O2 PRN at baseline.   Clinical Impression  Pt was in bed with family at bedside upon entry was very pleasant and agreeable to PT re-evaluation. Pt presents with generalized weakness, decreased activity tolerance, and decreased functional mobility. Pt tolerated bed level exercises with O2 sats at 94%. Pt required mod assist of 2 for rolling in bed to the L and R and for sitting EOB. Pt's O2 sats dropped to mid 80's on 3L O2 with sitting EOB, but pt tolerated sitting EOB for ~10 minutes performing pursed lip breathing attempting to increase O2 sats to >90%. O2 sats remained in mid to high 80's and pt assist back to bed with total assist of 2 to prevent O2 sats from dropping any lower. Pt's bed positioned in chair position at end of session and placed on 4L O2, per nursing, with O2 sats around 90-91%.     Follow Up Recommendations SNF    Equipment Recommendations  Rolling walker with 5" wheels;3in1 (PT) (Pt has 3in1 at home)    Recommendations for Other Services       Precautions / Restrictions Precautions Precautions:  Fall Restrictions Weight Bearing Restrictions: No      Mobility  Bed Mobility Overal bed mobility: Needs Assistance Bed Mobility: Rolling;Supine to Sit;Sit to Supine Rolling: Mod assist;+2 for physical assistance   Supine to sit: Mod assist;+2 for physical assistance Sit to supine: +2 for physical assistance;Total assist   General bed mobility comments: Pt required HHA from the front and a second assist with LE for rolling R and L; Pt required mod assist of 2 with LE and trunk to sit EOB; Pt required total assist of 2 for sit to supine d/t oxygen desaturation in mid 80's on 3L O2.     Transfers Overall transfer level:  (Not assessed d/t oxygen desaturation to mid 80's sitting EOB)                  Ambulation/Gait                Stairs            Wheelchair Mobility    Modified Rankin (Stroke Patients Only)       Balance Overall balance assessment: Needs assistance Sitting-balance support: Bilateral upper extremity supported;Feet supported Sitting balance-Leahy Scale: Poor Sitting balance - Comments: Pt requires min assist sitting EOB for ~10 minutes                                      Pertinent Vitals/Pain Pain Assessment: No/denies pain    Home  Living Family/patient expects to be discharged to:: Private residence Living Arrangements: Spouse/significant other Available Help at Discharge: Available 24 hours/day Type of Home: House Home Access: Stairs to enter Entrance Stairs-Rails: Can reach both Entrance Stairs-Number of Steps: 5 Home Layout: One level Home Equipment: Crutches;Walker - standard;Bedside commode      Prior Function Level of Independence: Independent with assistive device(s)         Comments: ambulates using loftstrand crutches. Reports she typically only ambulates 10-12' in home. She then reports she is able to ambulate outside as well. (Per prior PT note)     Hand Dominance        Extremity/Trunk  Assessment   Upper Extremity Assessment Upper Extremity Assessment: Generalized weakness (grossly at least 3/5 strength; good grip strength)    Lower Extremity Assessment Lower Extremity Assessment: Generalized weakness (grossly at least 3/5 strength; sensation intact)    Cervical / Trunk Assessment Cervical / Trunk Assessment: Kyphotic  Communication   Communication: No difficulties (pt's voice is quiet and raspy secondary to extubation yesterday.)  Cognition Arousal/Alertness: Awake/alert Behavior During Therapy: WFL for tasks assessed/performed Overall Cognitive Status: Within Functional Limits for tasks assessed                                 General Comments: Pt A/O x3      General Comments      Exercises     Assessment/Plan    PT Assessment Patient needs continued PT services  PT Problem List Decreased strength;Decreased mobility;Decreased activity tolerance;Cardiopulmonary status limiting activity;Decreased balance;Decreased knowledge of use of DME       PT Treatment Interventions DME instruction;Gait training;Stair training;Functional mobility training;Balance training;Therapeutic exercise;Therapeutic activities;Patient/family education    PT Goals (Current goals can be found in the Care Plan section)  Acute Rehab PT Goals Patient Stated Goal: to get stronger  PT Goal Formulation: With patient Time For Goal Achievement: 08/02/16 Potential to Achieve Goals: Good    Frequency Min 2X/week   Barriers to discharge Inaccessible home environment      Co-evaluation               AM-PAC PT "6 Clicks" Daily Activity  Outcome Measure Difficulty turning over in bed (including adjusting bedclothes, sheets and blankets)?: Total Difficulty moving from lying on back to sitting on the side of the bed? : Total Difficulty sitting down on and standing up from a chair with arms (e.g., wheelchair, bedside commode, etc,.)?: Total Help needed moving to and  from a bed to chair (including a wheelchair)?: Total Help needed walking in hospital room?: Total Help needed climbing 3-5 steps with a railing? : Total 6 Click Score: 6    End of Session Equipment Utilized During Treatment: Oxygen (3-4 L O2) Activity Tolerance:  (Pt limited by oxygen desaturation and dizziness 8/10 (RN notified)) Patient left: in bed;with call bell/phone within reach;Other (comment) (with speach therapist and RN in room ) Nurse Communication: Mobility status (O2 sats and dizziness) PT Visit Diagnosis: Difficulty in walking, not elsewhere classified (R26.2);Muscle weakness (generalized) (M62.81)    Time: 7106-2694 PT Time Calculation (min) (ACUTE ONLY): 38 min   Charges:         PT G Codes:        Clifford Coudriet, SPT 07/19/2016, 11:16 AM

## 2016-07-19 NOTE — Progress Notes (Signed)
Daily Progress Note   Patient Name: Hannah Powell       Date: 07/19/2016 DOB: November 25, 1945  Age: 71 y.o. MRN#: [id number] Attending Physician: Hillary Bow, MD Primary Care Physician: Adin Hector, MD Admit Date: 07/02/2016  Reason for Consultation/Follow-up: Establishing goals of care  Subjective: Patient is now extubated. Awake, alert, and oriented. Denies pain, discomfort, or shortness of breath. She speaks of her fears with having to be intubated. She is thankful to be feeling better today. Likely PT eval tomorrow. No family at bedside. Provided emotional and spiritual support.   Length of Stay: 17  Current Medications: Scheduled Meds:  . aspirin  81 mg Oral Daily  . budesonide (PULMICORT) nebulizer solution  0.25 mg Nebulization Q6H  . enoxaparin (LOVENOX) injection  40 mg Subcutaneous Q24H  . insulin aspart  0-15 Units Subcutaneous Q4H  . ipratropium-albuterol  3 mL Nebulization Q6H  . levothyroxine  200 mcg Oral QAC breakfast  . mouth rinse  15 mL Mouth Rinse BID  . pramipexole  4 mg Oral QHS  . [START ON 07/20/2016] predniSONE  40 mg Oral Q breakfast  . sodium chloride flush  10-40 mL Intracatheter Q12H  . traZODone  100 mg Oral QHS    Continuous Infusions: . dextrose 5 % with kcl 100 mL/hr at 07/19/16 1400  . famotidine (PEPCID) IV Stopped (07/19/16 1157)    PRN Meds: acetaminophen, albuterol, ALPRAZolam, bisacodyl, fentaNYL (SUBLIMAZE) injection, guaiFENesin-dextromethorphan, ondansetron (ZOFRAN) IV, promethazine, sodium chloride flush  Physical Exam  Constitutional: She is oriented to person, place, and time. She is cooperative. She appears ill.  HENT:  Head: Normocephalic and atraumatic.  Cardiovascular: Regular rhythm.   Pulmonary/Chest: No accessory muscle  usage. No tachypnea. No respiratory distress. She has decreased breath sounds. She has wheezes.  Abdominal: Normal appearance and bowel sounds are normal.  Musculoskeletal: Edema: generalized.  Neurological: She is alert and oriented to person, place, and time.  Skin: Skin is warm and dry. There is pallor.  Psychiatric: She has a normal mood and affect. Her speech is normal and behavior is normal. Cognition and memory are normal.  Nursing note and vitals reviewed.          Vital Signs: BP (!) 156/85   Pulse 84   Temp 98.5 F (36.9 C) (Oral)   Resp Marland Kitchen)  25   Ht 5\' 5"  (1.651 m)   Wt 111.2 kg (245 lb 2.4 oz)   SpO2 99%   BMI 40.80 kg/m  SpO2: SpO2: 99 % O2 Device: O2 Device: Nasal Cannula O2 Flow Rate: O2 Flow Rate (L/min): 4 L/min  Intake/output summary:   Intake/Output Summary (Last 24 hours) at 07/19/16 1438 Last data filed at 07/19/16 1400  Gross per 24 hour  Intake          1549.33 ml  Output             1150 ml  Net           399.33 ml   LBM: Last BM Date: 07/18/16 Baseline Weight: Weight: 113.4 kg (250 lb) Most recent weight: Weight: 111.2 kg (245 lb 2.4 oz)  Palliative Assessment/Data: PPS 30%   Flowsheet Rows     Most Recent Value  Intake Tab  Referral Department  Critical care  Unit at Time of Referral  ICU  Palliative Care Primary Diagnosis  Pulmonary  Date Notified  07/10/16  Palliative Care Type  New Palliative care  Reason for referral  Clarify Goals of Care  Date of Admission  07/02/16  Date first seen by Palliative Care  07/11/16  # of days Palliative referral response time  1 Day(s)  # of days IP prior to Palliative referral  8  Clinical Assessment  Palliative Performance Scale Score  30%  Psychosocial & Spiritual Assessment  Palliative Care Outcomes  Patient/Family meeting held?  Yes  Who was at the meeting?  patient  Palliative Care Outcomes  Clarified goals of care, Provided psychosocial or spiritual support, Linked to palliative care  logitudinal support      Patient Active Problem List   Diagnosis Date Noted  . Pneumonia due to Streptococcus pneumoniae ([place])   . Fever   . Community acquired pneumonia of right lung   . Palliative care by specialist   . Goals of care, counseling/discussion   . Pressure injury of skin 07/03/2016  . Acute respiratory failure ([place]) 07/02/2016  . Acute on chronic diastolic CHF (congestive heart failure) ([place]) 07/21/2015  . COPD exacerbation ([place]) 07/21/2015  . Acute renal insufficiency 07/21/2015  . Hyperglycemia 07/21/2015  . Benign essential HTN 07/21/2015  . Acute on chronic respiratory failure with hypoxia and hypercapnia ([place]) 07/18/2015  . S/P shoulder replacement 07/02/2013    Palliative Care Assessment & Plan   Patient Profile: 71 y.o. female  with past medical history of COPD on home oxygen, sleep apnea with CPAP HS, restless leg, hypertension, hypothyroidism, diastolic CHF, arthritis, and anxiety admitted on 07/02/2016 with respiratory distress and altered mental status. In ED, was in respiratory distress and placed on BiPAP. Found to have large right-sided pneumonia. Patient has acute on chronic hypoxic and hypercapnic respiratory failure complicated by pneumococcal pneumonia and severe COPD. Palliative medicine consultation for goals of care.   Assessment: Acute on chronic hypoxic/hypercarbic respiratory failure Severe COPD Acute diastolic CHF Sepsis with right-sided pneumonia Acute kidney injury due to ATN Sleep apnea on CPAP HS Essential hypertension Hypothyroidism   Recommendations/Plan:  DNR  Clinical improvement. Now on nasal cannula.  PT evaluation pending.  Could benefit from palliative services to follow outpatient.   PMT will continue to support patient/family through hospitalization  Goals of Care and Additional Recommendations:  Limitations on Scope of Treatment: Full Scope Treatment no resuscitation  Code Status: DNR   Code Status Orders         Start  Ordered   07/02/16 1244  Do not attempt resuscitation (DNR)  Continuous    Question Answer Comment  In the event of cardiac or respiratory ARREST Do not call a "code blue"   In the event of cardiac or respiratory ARREST Do not perform Intubation, CPR, defibrillation or ACLS   In the event of cardiac or respiratory ARREST Use medication by any route, position, wound care, and other measures to relive pain and suffering. May use oxygen, suction and manual treatment of airway obstruction as needed for comfort.   Comments nurse may pronounce      07/02/16 1244    Code Status History    Date Active Date Inactive Code Status Order ID Comments User Context   07/18/2015  1:24 PM 07/22/2015  3:51 PM Full Code [id number]  Flora Lipps, MD ED   07/02/2013 11:24 AM 07/03/2013  1:35 PM Full Code [id number]  Jenetta Loges, PA-C Inpatient       Prognosis:   Unable to determine  Discharge Planning:  To Be Determined  Care plan was discussed with patient and RN  Thank you for allowing the Palliative Medicine Team to assist in the care of this patient.   Time In: 1215 Time Out: 1230 Total Time 70min Prolonged Time Billed no      Greater than 50%  of this time was spent counseling and coordinating care related to the above assessment and plan.  Ihor Dow, FNP-C Palliative Medicine Team  Phone: [phone] Fax: [phone]  Please contact Palliative Medicine Team phone at [phone] for questions and concerns.

## 2016-07-19 NOTE — Evaluation (Signed)
Clinical/Bedside Swallow Evaluation Patient Details  Name: Hannah Powell MRN: [id number] Date of Birth: October 04, 1945  Today's Date: 07/19/2016 Time: SLP Start Time (ACUTE ONLY): 1000 SLP Stop Time (ACUTE ONLY): 1100 SLP Time Calculation (min) (ACUTE ONLY): 60 min  Past Medical History:  Past Medical History:  Diagnosis Date  . Anxiety   . Arthritis   . CHF (congestive heart failure) ([place])   . COPD (chronic obstructive pulmonary disease) ([place])   . Hypertension    dr Otho Najjar     . Hypothyroidism   . RLS (restless legs syndrome)   . Shortness of breath   . Sleep apnea    cpap      >5 yrs   Past Surgical History:  Past Surgical History:  Procedure Laterality Date  . BACK SURGERY     cervical  . BREAST BIOPSY Left    needle bx-neg  . FOOT ARTHROPLASTY    . JOINT REPLACEMENT     x2 tkr  . TOTAL SHOULDER ARTHROPLASTY Left 07/02/2013   Procedure: LEFT TOTAL SHOULDER ARTHROPLASTY;  Surgeon: Marin Shutter, MD;  Location: [facility];  Service: Orthopedics;  Laterality: Left;   HPI:      Assessment / Plan / Recommendation Clinical Impression  Pt appears at reduced-mild risk for aspiration but when following general aspiration precautions during oral intake, no immediate, overt s/s of aspiration were noted. Pt does exhibit min increased pulmonary effort w/ the exertion expended during oral intake/trials. Pt's O2 sats remained at their baseline of ~90% during/post trials. Pt only accepted a few po trials total of thin liquids and puree; declined solids. She endorses and demonstrated frequent belching and feelings of N/V. Recommend a more Full Liquid diet consistency and presentation for ease of swallowing/intake; thin liquids; general aspiration precautions; Reflux precautions. Trials to upgrade consistency/foods when pt is desiring such. Dietician updated; NSG updated.  SLP Visit Diagnosis: Dysphagia, unspecified (R13.10)    Aspiration Risk   (reduced-mild aspiration risk)    Diet  Recommendation  Full Liquid diet consistency of thin liquids, purees; general aspiration precautions; Reflux precautions. Trials to upgrade to solids as pt desires - Mech Soft consistency  Medication Administration: Whole meds with puree (Crushed if needed for easier, safer swallowing)    Other  Recommendations Recommended Consults: Consider GI evaluation (Dietician following) Oral Care Recommendations: Oral care BID;Patient independent with oral care;Staff/trained caregiver to provide oral care   Follow up Recommendations  (TBD)      Frequency and Duration min 2x/week  1 week       Prognosis Prognosis for Safe Diet Advancement: Fair (-Good)      Swallow Study   General Date of Onset: 07/02/16 Type of Study: Bedside Swallow Evaluation Previous Swallow Assessment: none Diet Prior to this Study: Regular;Thin liquids Temperature Spikes Noted: No (wbc 7.3) Respiratory Status: Nasal cannula (4 liters; CO2 elevated) History of Recent Intubation: Yes Length of Intubations (days): 7 days Date extubated: 07/18/16 Behavior/Cognition: Alert;Cooperative;Pleasant mood;Requires cueing Oral Cavity Assessment: Dry Oral Care Completed by SLP: Recent completion by staff Oral Cavity - Dentition: Adequate natural dentition Vision: Functional for self-feeding Self-Feeding Abilities: Able to feed self;Needs assist;Needs set up Patient Positioning: Upright in bed Baseline Vocal Quality: Low vocal intensity;Hoarse (hacking cough) Volitional Cough: Congested (hacking) Volitional Swallow: Able to elicit    Oral/Motor/Sensory Function Overall Oral Motor/Sensory Function: Within functional limits   Ice Chips Ice chips: Within functional limits Presentation: Spoon (fed; 3 trials)   Thin Liquid Thin Liquid: Impaired Presentation: Cup;Self Fed;Straw (  8 trials total) Oral Phase Impairments:  (none) Oral Phase Functional Implications:  (none) Pharyngeal  Phase Impairments:  (increased respiratory  effort w/ exertion) Other Comments: declined further    Nectar Thick Nectar Thick Liquid: Not tested   Honey Thick Honey Thick Liquid: Not tested   Puree Puree: Impaired Presentation: Spoon;Self Fed (5 trials) Oral Phase Impairments:  (took only a minimal amount from spoon) Oral Phase Functional Implications:  (none) Pharyngeal Phase Impairments:  (increased respiratory effort w/ the exertion)   Solid   GO   Solid: Not tested Other Comments: pt declined         Orinda Kenner, MS, CCC-SLP Hannah Powell 07/19/2016,2:17 PM

## 2016-07-19 NOTE — Progress Notes (Signed)
Patient's central line inserted 7 days ago, it flushes and draws back well.  The dressing is very clean and dry, antimicrobial disc is also clean and in place.  The patient has declined to have the dressing changed at this time.  NP Hinton Dyer informed; we will monitor the line and keep it for now as patient only has 1 PIV.  Will report to night RN that patient should be asked each shift if we can change the dressing

## 2016-07-19 NOTE — Plan of Care (Signed)
Problem: Coping: Goal: Verbalizations of decreased anxiety will increase Outcome: Progressing Patient able to ask for anxiety medication when needed and is easily able to calm down with communication and encouragement to remain calm.  Problem: Respiratory: Goal: Respiratory status will improve Outcome: Progressing Patient using incentive spirometer while awake and practicing coughing and deep breathing exercises.  Patient tolerating 4L [place] while awake and wearing CPAP while resting. Goal: Ability to maintain a clear airway will improve Outcome: Progressing Patient able to cough and deep breath as instructed; uses incentive spirometer as instructed; tolerating chest PT as needed; uses oral suction for sputum produced to clear airway Goal: Pain level will decrease Outcome: Progressing Patient does not have complaints of any pain or discomfort at this time

## 2016-07-20 LAB — CBC WITH DIFFERENTIAL/PLATELET
Basophils Absolute: 0 10*3/uL (ref 0–0.1)
Basophils Relative: 0 %
EOS PCT: 5 %
Eosinophils Absolute: 0.4 10*3/uL (ref 0–0.7)
HEMATOCRIT: 35.8 % (ref 35.0–47.0)
HEMOGLOBIN: 11.3 g/dL — AB (ref 12.0–16.0)
Lymphocytes Relative: 8 %
Lymphs Abs: 0.7 10*3/uL — ABNORMAL LOW (ref 1.0–3.6)
MCH: 30.1 pg (ref 26.0–34.0)
MCHC: 31.7 g/dL — AB (ref 32.0–36.0)
MCV: 94.9 fL (ref 80.0–100.0)
MONOS PCT: 9 %
Monocytes Absolute: 0.8 10*3/uL (ref 0.2–0.9)
NEUTROS ABS: 6.6 10*3/uL — AB (ref 1.4–6.5)
NRBC: 1 /100{WBCs} — AB
Neutrophils Relative %: 78 %
Platelets: 355 10*3/uL (ref 150–440)
RBC: 3.77 MIL/uL — ABNORMAL LOW (ref 3.80–5.20)
RDW: 16.4 % — ABNORMAL HIGH (ref 11.5–14.5)
WBC: 8.5 10*3/uL (ref 3.6–11.0)

## 2016-07-20 LAB — BASIC METABOLIC PANEL
Anion gap: 3 — ABNORMAL LOW (ref 5–15)
BUN: 21 mg/dL — ABNORMAL HIGH (ref 6–20)
CO2: 45 mmol/L — ABNORMAL HIGH (ref 22–32)
CREATININE: 0.53 mg/dL (ref 0.44–1.00)
Calcium: 8.4 mg/dL — ABNORMAL LOW (ref 8.9–10.3)
Chloride: 92 mmol/L — ABNORMAL LOW (ref 101–111)
Glucose, Bld: 100 mg/dL — ABNORMAL HIGH (ref 65–99)
Potassium: 3.5 mmol/L (ref 3.5–5.1)
SODIUM: 140 mmol/L (ref 135–145)

## 2016-07-20 LAB — GLUCOSE, CAPILLARY
GLUCOSE-CAPILLARY: 110 mg/dL — AB (ref 65–99)
GLUCOSE-CAPILLARY: 86 mg/dL (ref 65–99)
Glucose-Capillary: 100 mg/dL — ABNORMAL HIGH (ref 65–99)
Glucose-Capillary: 127 mg/dL — ABNORMAL HIGH (ref 65–99)

## 2016-07-20 LAB — BODY FLUID CULTURE: Culture: NO GROWTH

## 2016-07-20 LAB — TSH: TSH: 2.203 u[IU]/mL (ref 0.350–4.500)

## 2016-07-20 MED ORDER — KETOROLAC TROMETHAMINE 15 MG/ML IJ SOLN
15.0000 mg | Freq: Four times a day (QID) | INTRAMUSCULAR | Status: DC | PRN
Start: 2016-07-20 — End: 2016-07-21

## 2016-07-20 NOTE — Progress Notes (Signed)
SLP Cancellation Note  Patient Details Name: Hannah Powell MRN: [id number] DOB: 02/18/1946   Cancelled treatment:       Reason Eval/Treat Not Completed:  (chart reviewed; pt consulted: nsg consulted) Pt's diet consistency has been upgraded by MD; pt stated she is feeling "better" but continues to be easily SOB w/ any exertion. Briefly discussed recommendations from BSE yesterday including choosing easy to masticate foods to lessen the exertion of the masticating task; rest breaks; eating/drinking slowly during any oral intake; pt verbally agreed. NSG to use a puree w/ Pills if any overt s/s of aspiration noted using liquids.  ST services will be available for any further education or assessment while pt is admitted; NSG/MD to reconsult.    Orinda Kenner, [place], CCC-SLP Watson,Katherine 07/20/2016, 1:43 PM

## 2016-07-20 NOTE — Care Management (Signed)
Met with patient and her husband. No current changes to patient discharge plan. [facility] also updated. RNCM to continue to follow.

## 2016-07-20 NOTE — Progress Notes (Signed)
PT PROFILE: 71 y.o. F with moderate to severe COPD, chronic oxygen therapy, moderately impaired at baseline. Admitted 07/02/16 with acute on chronic hypoxemic/hypercarbic respiratory failure due to severe right-sided pneumonia. Pneumococcal urine antigen was positive. Patient was initially supported with noninvasive ventilation. She was transferred out of the intensive care unit on the day following admission but returned shortly thereafter with worsening hypercarbic respiratory failure. Was transferred out and returned once again several days later. Ultimately was intubated 04/26 and extubated 05/2  SUBJ: Pt stating she is more short of breath today then she was yesterday.   PHYSICAL EXAMINATION: General: well developed, well nourished, NAD  Neuro: No focal deficits, cranial nerves intact, follows commands  HEENT: NCAT, sclerae white Cardiovascular: nsr, s1s2, no M/R/G Lungs: wheezes bilateral upper lobes diminished throughout, even, non labored  Abdomen: Obese, soft, hypoactive BS x4, RUQ tenderness, non distended  Extremities: Warm, 2+ bilateral lower extremity edema  Skin: no rashes or lesions, scattered ecchymosis   LABS: BMP Latest Ref Rng & Units 07/20/2016 07/19/2016 07/19/2016  Glucose 65 - 99 mg/dL 100(H) 146(H) 97  BUN 6 - 20 mg/dL 21(H) 25(H) 27(H)  Creatinine 0.44 - 1.00 mg/dL 0.53 0.49 0.50  Sodium 135 - 145 mmol/L 140 148(H) 146(H)  Potassium 3.5 - 5.1 mmol/L 3.5 3.5 2.8(L)  Chloride 101 - 111 mmol/L 92(L) 95(L) 92(L)  CO2 22 - 32 mmol/L 45(H) 48(H) 50(H)  Calcium 8.9 - 10.3 mg/dL 8.4(L) 8.9 8.4(L)   CBC Latest Ref Rng & Units 07/20/2016 07/19/2016 07/17/2016  WBC 3.6 - 11.0 K/uL 8.5 7.3 10.8  Hemoglobin 12.0 - 16.0 g/dL 11.3(L) 10.9(L) 11.2(L)  Hematocrit 35.0 - 47.0 % 35.8 33.1(L) 34.4(L)  Platelets 150 - 440 K/uL 355 303 241     CXR: NNF  IMPRESSION: Acute on chronic hypoxemic, hypercarbic respiratory failure-improving Moderate to severe COPD at baseline Severe metabolic  alkalosis secondary to loop diuresis  Pneumococcal pneumonia - nearly fully resolved R lung infiltrate R pleural effusion - S/P thoracentesis 05/01. 500cc removed. Fluid c/w transudate Hypotension - resolved Acute delirium - resolved Hypokalemia, recurrent  PLAN/REC: Supplemental O2 to keep saturation greater than 90% CPAP qhs Discontinue maintenance fluids  Continue nebulized steroids and bronchodilators Monitor temp, WBC count Micro and abx as above - all antibiotics discontinued 05/02 Monitor BMET intermittently Monitor I/Os Correct electrolytes as indicated Continue sliding scale insulin  Physical therapy consulted Frequent OOB to chair as tolerated  Pulmonary hygiene Pts husband updated regarding plan of care and all questions answered  -Pt will stay in [facility] today will reassess in the am 05/5 if she is ready to transfer out of [facility].  Marda Stalker, [place] Pager [phone] (please enter 7 digits) [place] Pager [phone] (please enter 7 digits)   Pt interviewed, examined and database reviewed. Husband and pt updated @ bedside. Agree with above findings, assessment and plan  Merton Border, MD PCCM service Mobile [phone] Pager [phone] 07/20/2016 2:19 PM

## 2016-07-20 NOTE — Care Management Important Message (Signed)
Important Message  Patient Details  Name: Hannah Powell MRN: [id number] Date of Birth: 18-Oct-1945   Medicare Important Message Given:  Yes    Marshell Garfinkel, RN 07/20/2016, 9:08 AM

## 2016-07-20 NOTE — Plan of Care (Signed)
Problem: Pain Managment: Goal: General experience of comfort will improve Outcome: Progressing Patient denies any pain or discomfort during this shift.  Problem: Physical Regulation: Goal: Ability to maintain clinical measurements within normal limits will improve Outcome: Progressing Vitals WNL throughout shift, afebrile, tolerating 4LNC with no distress  Problem: Coping: Goal: Verbalizations of decreased anxiety will increase Outcome: Progressing Patient verbalized decrease of anxiety overall from previous night.  Rested more comfortably throughout shift.  Problem: Physical Regulation: Goal: Ability to maintain a body temperature in the normal range will improve Outcome: Progressing Patient remains afebrile throughout shift.  Problem: Respiratory: Goal: Ability to maintain a clear airway will improve Outcome: Progressing Patient experiencing less dyspnea with coughing spells and able to clear sputum when coughed up.

## 2016-07-20 NOTE — Progress Notes (Signed)
[facility] at [place] NAME: Hannah Powell    MR#:  [id number]  DATE OF BIRTH:  1945-08-26  SUBJECTIVE:  Patient got intubated on 07/12/16.---extubated on 07/18/16  Feels weak. Worked with PT yesterday in bed On 4 L o2  REVIEW OF SYSTEMS:   Review of Systems  Constitutional: Positive for malaise/fatigue. Negative for chills and fever.  HENT: Negative for sore throat.   Eyes: Negative for blurred vision, double vision and pain.  Respiratory: Positive for cough, shortness of breath and wheezing. Negative for hemoptysis.   Cardiovascular: Negative for chest pain, palpitations, orthopnea and leg swelling.  Gastrointestinal: Negative for abdominal pain, constipation, diarrhea, heartburn, nausea and vomiting.  Genitourinary: Negative for dysuria and hematuria.  Musculoskeletal: Positive for back pain. Negative for joint pain.  Skin: Negative for rash.  Neurological: Negative for sensory change, speech change, focal weakness and headaches.  Endo/Heme/Allergies: Does not bruise/bleed easily.  Psychiatric/Behavioral: Negative for depression. The patient is not nervous/anxious.    DRUG ALLERGIES:   Allergies  Allergen Reactions  . Lyrica [Pregabalin] Other (See Comments)    Reaction: made Restless Leg Syndrome worse     VITALS:  Blood pressure 124/79, pulse 95, temperature 98.8 F (37.1 C), temperature source Oral, resp. rate (!) 25, height 5\' 5"  (1.651 m), weight 111.2 kg (245 lb 2.4 oz), SpO2 95 %.  PHYSICAL EXAMINATION:   Physical Exam  GENERAL:  71 y.o.-year-old patient lying in the bed. Obese. EYES: Pupils equal, round, reactive to light and accommodation. No scleral icterus. Extraocular muscles intact.  HEENT: Head atraumatic, normocephalic. Oropharynx and nasopharynx clear. -NECK  Supple, no jugular venous distention. No thyroid enlargement, no tenderness.  LUNGS:distant breath sounds bilaterally, no wheezing, + rales,no  rhonchi. Shallow breathing CARDIOVASCULAR: S1, S2 normal. No murmurs, rubs, or gallops.  ABDOMEN: Soft, nontender, nondistended. Bowel sounds present. No organomegaly or mass.  EXTREMITIES: No cyanosis, clubbing  + edema b/l.    NEUROLOGIC: weak Grossly nonfocal moves all her extremities well Psychiatric: Alert and oriented 3  SKIN: No obvious rash, lesion, or ulcer.   LABORATORY PANEL:  CBC  Recent Labs Lab 07/20/16 0609  WBC 8.5  HGB 11.3*  HCT 35.8  PLT 355    Chemistries   Recent Labs Lab 07/19/16 0522  07/20/16 0609  NA 146*  < > 140  K 2.8*  < > 3.5  CL 92*  < > 92*  CO2 50*  < > 45*  GLUCOSE 97  < > 100*  BUN 27*  < > 21*  CREATININE 0.50  < > 0.53  CALCIUM 8.4*  < > 8.4*  MG 1.9  --   --   < > = values in this interval not displayed. Cardiac Enzymes No results for input(s): TROPONINI in the last 168 hours. RADIOLOGY:  No results found. ASSESSMENT AND PLAN:  Hannah Powell a 71 y.o.femalepresented with respiratory distress. She stated on Thursday she started feeling bad. She needed to increase her oxygen from 2 L to 3 L. On Saturday she had shaking chills and fever.   1. Acute on chronic hypoxic/hypercarbic respiratory failure with hypoxia and acidosis.  -Appears combination of severe COPD and CHF acute diastolic (Echo in the past EF 65%) -Patient continued to worsen despite aggressive medical management. -Patient intubated on  (07/12/16)---now extubated 07/18/2016. On 4 L O2 -Repeat cultures including blood cultures sputum culture no growth from 07/13/2016 -Status post thoracentesis 07/17/2016 - 500 cc of fluid  2. Severe  sepsis with Right-sided pneumonia, leukocytosis and tachypnea. IV Vanco and Zosyn were given. Patient currently not on any antibiotics - repeat blood cultures from 4/27 negative  - sputum cultures negative -was on IV solucortef (stress doses) - Stopped  3. Acute kidney injury due to ATN from pneumonia and sepsis. -creatinine  improving Resolved  4. Acute on chronic COPD on chronic oxygen. -management as above  5.History of obstructive sleep apnea   6.  History of Essential hypertension patient was hypotensive on IV levophed. Add meds slowly  7. Hypothyroidism    8. Palliative care on board -Patient remains DO NOT RESUSCITATE  Discussed with patient and husband at bedside.  CODE STATUS: DNR  DVT Prophylaxis: lovenox  TOTAL TIME TAKING CARE OF THIS PATIENT: 30 minutes.    Hillary Bow R M.D on 07/20/2016 at 10:29 AM  Between 7am to 6pm - Pager - [phone]  After 6pm go to [email or web address] - password [email or web address] [facility]  Office  [phone]  CC: Primary care physician; Adin Hector, MD   Note: This dictation was prepared with Dragon dictation along with smaller phrase technology. Any transcriptional errors that result from this process are unintentional.

## 2016-07-20 NOTE — Progress Notes (Signed)
Patient alert and oriented. Vitals stable- No complaints of pain.  Urine output adequate- foley removed.  Per Dr. Alva Garnet- leave Central IJ in for one more day.  Family at bedside.

## 2016-07-21 ENCOUNTER — Inpatient Hospital Stay: Payer: Medicare HMO

## 2016-07-21 DIAGNOSIS — R29898 Other symptoms and signs involving the musculoskeletal system: Secondary | ICD-10-CM

## 2016-07-21 LAB — GLUCOSE, CAPILLARY
GLUCOSE-CAPILLARY: 141 mg/dL — AB (ref 65–99)
Glucose-Capillary: 72 mg/dL (ref 65–99)
Glucose-Capillary: 144 mg/dL — ABNORMAL HIGH (ref 65–99)
Glucose-Capillary: 111 mg/dL — ABNORMAL HIGH (ref 65–99)

## 2016-07-21 LAB — CBC WITH DIFFERENTIAL/PLATELET
BASOS ABS: 0 10*3/uL (ref 0–0.1)
Basophils Relative: 0 %
EOS PCT: 5 %
Eosinophils Absolute: 0.4 10*3/uL (ref 0–0.7)
HCT: 35.2 % (ref 35.0–47.0)
Hemoglobin: 11.5 g/dL — ABNORMAL LOW (ref 12.0–16.0)
LYMPHS PCT: 9 %
Lymphs Abs: 0.8 10*3/uL — ABNORMAL LOW (ref 1.0–3.6)
MCH: 30.3 pg (ref 26.0–34.0)
MCHC: 32.6 g/dL (ref 32.0–36.0)
MCV: 93 fL (ref 80.0–100.0)
Monocytes Absolute: 0.7 10*3/uL (ref 0.2–0.9)
Monocytes Relative: 9 %
NEUTROS ABS: 6.7 10*3/uL — AB (ref 1.4–6.5)
NEUTROS PCT: 77 %
Platelets: 370 10*3/uL (ref 150–440)
RBC: 3.79 MIL/uL — AB (ref 3.80–5.20)
RDW: 16 % — AB (ref 11.5–14.5)
WBC: 8.7 10*3/uL (ref 3.6–11.0)

## 2016-07-21 LAB — BASIC METABOLIC PANEL
ANION GAP: 3 — AB (ref 5–15)
BUN: 18 mg/dL (ref 6–20)
CALCIUM: 8.4 mg/dL — AB (ref 8.9–10.3)
CO2: 42 mmol/L — ABNORMAL HIGH (ref 22–32)
Chloride: 93 mmol/L — ABNORMAL LOW (ref 101–111)
Creatinine, Ser: 0.35 mg/dL — ABNORMAL LOW (ref 0.44–1.00)
Glucose, Bld: 90 mg/dL (ref 65–99)
Potassium: 3.8 mmol/L (ref 3.5–5.1)
Sodium: 138 mmol/L (ref 135–145)

## 2016-07-21 MED ORDER — NEOMYCIN-POLYMYXIN-DEXAMETH 3.5-10000-0.1 OP SUSP
1.0000 [drp] | Freq: Four times a day (QID) | OPHTHALMIC | Status: DC
  Administered 2016-07-21 – 2016-07-24 (×13): 1 [drp] via OPHTHALMIC
  Filled 2016-07-21: qty 5

## 2016-07-21 NOTE — Progress Notes (Signed)
Pt remains alert and oriented.  Pt has not complained of any pain during the night.  She did wear cpap while she slept but is now on 3L nasal cannula.  Urine output has been more than adequate and she has been incontinent at times.  Central line remains intact.

## 2016-07-21 NOTE — Progress Notes (Signed)
[facility] at [place] NAME: Hannah Powell    MR#:  [id number]  DATE OF BIRTH:  December 12, 1945  SUBJECTIVE:  Patient got intubated on 07/12/16.---extubated on 07/18/16  Feels weak. Worked with PT yesterday in bed On 4 L o2 Family in the room  REVIEW OF SYSTEMS:   Review of Systems  Constitutional: Positive for malaise/fatigue. Negative for chills and fever.  HENT: Negative for sore throat.   Eyes: Negative for blurred vision, double vision and pain.  Respiratory: Positive for cough, shortness of breath and wheezing. Negative for hemoptysis.   Cardiovascular: Negative for chest pain, palpitations, orthopnea and leg swelling.  Gastrointestinal: Negative for abdominal pain, constipation, diarrhea, heartburn, nausea and vomiting.  Genitourinary: Negative for dysuria and hematuria.  Musculoskeletal: Positive for back pain. Negative for joint pain.  Skin: Negative for rash.  Neurological: Negative for sensory change, speech change, focal weakness and headaches.  Endo/Heme/Allergies: Does not bruise/bleed easily.  Psychiatric/Behavioral: Negative for depression. The patient is not nervous/anxious.    DRUG ALLERGIES:   Allergies  Allergen Reactions  . Lyrica [Pregabalin] Other (See Comments)    Reaction: made Restless Leg Syndrome worse     VITALS:  Blood pressure 127/79, pulse 100, temperature 98.5 F (36.9 C), temperature source Oral, resp. rate (!) 33, height 5\' 5"  (1.651 m), weight 111.2 kg (245 lb 2.4 oz), SpO2 96 %.  PHYSICAL EXAMINATION:   Physical Exam  GENERAL:  71 y.o.-year-old patient lying in the bed. Obese. EYES: Pupils equal, round, reactive to light and accommodation. No scleral icterus. Extraocular muscles intact.  HEENT: Head atraumatic, normocephalic. Oropharynx and nasopharynx clear. -NECK  Supple, no jugular venous distention. No thyroid enlargement, no tenderness.  LUNGS:distant breath sounds bilaterally, no  wheezing, + rales,no rhonchi. Shallow breathing CARDIOVASCULAR: S1, S2 normal. No murmurs, rubs, or gallops.  ABDOMEN: Soft, nontender, nondistended. Bowel sounds present. No organomegaly or mass.  EXTREMITIES: No cyanosis, clubbing  + edema b/l.    NEUROLOGIC: weak Grossly nonfocal moves all her extremities well Psychiatric: Alert and oriented 3  SKIN: No obvious rash, lesion, or ulcer.   LABORATORY PANEL:  CBC  Recent Labs Lab 07/21/16 0428  WBC 8.7  HGB 11.5*  HCT 35.2  PLT 370    Chemistries   Recent Labs Lab 07/19/16 0522  07/21/16 0428  NA 146*  < > 138  K 2.8*  < > 3.8  CL 92*  < > 93*  CO2 50*  < > 42*  GLUCOSE 97  < > 90  BUN 27*  < > 18  CREATININE 0.50  < > 0.35*  CALCIUM 8.4*  < > 8.4*  MG 1.9  --   --   < > = values in this interval not displayed. Cardiac Enzymes No results for input(s): TROPONINI in the last 168 hours. RADIOLOGY:  Dg Chest Port 1 View  Result Date: 07/21/2016 CLINICAL DATA:  Acute respiratory failure. EXAM: PORTABLE CHEST 1 VIEW COMPARISON:  Radiograph Jul 18, 2016. FINDINGS: Stable cardiomegaly. Endotracheal and nasogastric tubes have been removed. Left internal jugular catheter is again noted with distal tip in expected position of the SVC. Status post left shoulder arthroplasty. No pneumothorax is noted. Stable right upper lobe opacity is noted concerning for pneumonia. Mild bibasilar subsegmental atelectasis or infiltrates are noted with minimal pleural effusions. Bony thorax is unremarkable. IMPRESSION: Endotracheal and nasogastric tubes have been removed. Stable position of left internal jugular catheter. Stable right upper lobe opacity concerning for  possible pneumonia. Stable mild bibasilar subsegmental atelectasis or infiltrates with minimal pleural effusions. Electronically Signed   By: Marijo Conception, M.D.   On: 07/21/2016 07:11   ASSESSMENT AND PLAN:  Hannah Powell a 71 y.o.femalepresented with respiratory distress. She  stated on Thursday she started feeling bad. She needed to increase her oxygen from 2 L to 3 L. On Saturday she had shaking chills and fever.   1. Acute on chronic hypoxic/hypercarbic respiratory failure with hypoxia and acidosis.  -Appears combination of severe COPD and CHF acute diastolic (Echo in the past EF 65%) -Patient continued to worsen despite aggressive medical management. -Patient intubated on  (07/12/16)---now extubated 07/18/2016. On 4 L O2 -Repeat cultures including blood cultures sputum culture no growth from 07/13/2016 -Status post thoracentesis 07/17/2016 - 500 cc of fluid  2. Severe sepsis with Right-sided pneumonia, leukocytosis and tachypnea. IV Vanco and Zosyn were given. Patient currently not on any antibiotics - repeat blood cultures from 4/27 negative  - sputum cultures negative -was on IV solucortef (stress doses) - Stopped  3. Acute kidney injury due to ATN from pneumonia and sepsis. -creatinine improving Resolved  4. Acute on chronic COPD on chronic oxygen. -management as above  5.History of obstructive sleep apnea   6.  History of Essential hypertension patient was hypotensive on IV levophed. Add meds slowly  7. Hypothyroidism    8. Palliative care on board -Patient remains DO NOT RESUSCITATE  Pt being transferred out to the floor  Discussed with patient and daughter at bedside.  CODE STATUS: DNR  DVT Prophylaxis: lovenox  TOTAL TIME TAKING CARE OF THIS PATIENT: 30 minutes.    Robby Bulkley M.D on 07/21/2016 at 12:49 PM  Between 7am to 6pm - Pager - [phone]  After 6pm go to [email or web address] - password [email or web address] [facility]  Office  [phone]  CC: Primary care physician; Adin Hector, MD   Note: This dictation was prepared with Dragon dictation along with smaller phrase technology. Any transcriptional errors that result from this process are unintentional.

## 2016-07-21 NOTE — Progress Notes (Signed)
PT PROFILE: 71 y.o. F with moderate to severe COPD, chronic oxygen therapy, moderately impaired at baseline. Admitted 07/02/16 with acute on chronic hypoxemic/hypercarbic respiratory failure due to severe right-sided pneumonia. Pneumococcal urine antigen was positive. Patient was initially supported with noninvasive ventilation. She was transferred out of the intensive care unit on the day following admission but returned shortly thereafter with worsening hypercarbic respiratory failure. Was transferred out and returned once again several days later. Ultimately was intubated 04/26 and extubated 05/2  SUBJ: Improved. Denies dyspnea. No new complaints.   PHYSICAL EXAMINATION: General: well developed, well nourished, NAD  Neuro: No focal deficits, cranial nerves intact, diffusely weak, cognition intact HEENT: NCAT, sclerae white Cardiovascular: Regular, no murmurs Lungs: Diminished throughout, no wheezes Abdomen: Soft, nontender, bowel sounds present Extremities: Warm, 2+ bilateral lower extremity edema  Skin: no rashes or lesions, scattered ecchymosis   LABS: BMP Latest Ref Rng & Units 07/21/2016 07/20/2016 07/19/2016  Glucose 65 - 99 mg/dL 90 100(H) 146(H)  BUN 6 - 20 mg/dL 18 21(H) 25(H)  Creatinine 0.44 - 1.00 mg/dL 0.35(L) 0.53 0.49  Sodium 135 - 145 mmol/L 138 140 148(H)  Potassium 3.5 - 5.1 mmol/L 3.8 3.5 3.5  Chloride 101 - 111 mmol/L 93(L) 92(L) 95(L)  CO2 22 - 32 mmol/L 42(H) 45(H) 48(H)  Calcium 8.9 - 10.3 mg/dL 8.4(L) 8.4(L) 8.9   CBC Latest Ref Rng & Units 07/21/2016 07/20/2016 07/19/2016  WBC 3.6 - 11.0 K/uL 8.7 8.5 7.3  Hemoglobin 12.0 - 16.0 g/dL 11.5(L) 11.3(L) 10.9(L)  Hematocrit 35.0 - 47.0 % 35.2 35.8 33.1(L)  Platelets 150 - 440 K/uL 370 355 303     CXR: No significant change  IMPRESSION: Acute on chronic hypoxemic, hypercarbic respiratory failure - mproving Moderate to severe COPD at baseline Severe metabolic alkalosis secondary to loop diuresis -  improving Pneumococcal pneumonia - treated and nearly fully resolved R lung infiltrate R pleural effusion - S/P thoracentesis 05/01. 500cc removed. Fluid c/w transudate Mild hyperglycemia without prior history of diabetes Severe deconditioning due to prolonged hospitalization Hypotension - resolved Acute delirium - resolved Hypokalemia, recurrent  PLAN/REC: Supplemental O2 to keep saturation greater than 90% Nocturnal BiPAP Continue nebulized steroids and bronchodilators Monitor BMET intermittently Monitor I/Os Correct electrolytes as indicated Discontinue sliding scale insulin  Monitor glucose on chemistry panels PT/OT involved Advance activity as able   Transfer to [facility]. PCCM will continue to follow  Merton Border, MD PCCM service Mobile [phone] Pager [phone] 07/21/2016 3:56 PM

## 2016-07-22 LAB — GLUCOSE, CAPILLARY
GLUCOSE-CAPILLARY: 144 mg/dL — AB (ref 65–99)
GLUCOSE-CAPILLARY: 143 mg/dL — AB (ref 65–99)
Glucose-Capillary: 103 mg/dL — ABNORMAL HIGH (ref 65–99)
Glucose-Capillary: 123 mg/dL — ABNORMAL HIGH (ref 65–99)

## 2016-07-22 MED ORDER — HYDROCOD POLST-CPM POLST ER 10-8 MG/5ML PO SUER
5.0000 mL | Freq: Every evening | ORAL | Status: DC | PRN
  Administered 2016-07-22: 5 mL via ORAL
  Filled 2016-07-22: qty 5

## 2016-07-22 MED ORDER — LOSARTAN POTASSIUM 50 MG PO TABS
25.0000 mg | ORAL_TABLET | Freq: Every day | ORAL | Status: DC
  Administered 2016-07-22 – 2016-07-24 (×3): 25 mg via ORAL
  Filled 2016-07-22 (×3): qty 1

## 2016-07-22 MED ORDER — ROFLUMILAST 500 MCG PO TABS
500.0000 ug | ORAL_TABLET | Freq: Every day | ORAL | Status: DC
  Administered 2016-07-22 – 2016-07-24 (×3): 500 ug via ORAL
  Filled 2016-07-22 (×3): qty 1

## 2016-07-22 MED ORDER — FLUTICASONE FUROATE-VILANTEROL 100-25 MCG/INH IN AEPB
1.0000 | INHALATION_SPRAY | Freq: Every day | RESPIRATORY_TRACT | Status: DC
  Administered 2016-07-22 – 2016-07-23 (×2): 1 via RESPIRATORY_TRACT
  Filled 2016-07-22: qty 28

## 2016-07-22 MED ORDER — CALCIUM CARBONATE-VITAMIN D 500-200 MG-UNIT PO TABS
1.0000 | ORAL_TABLET | Freq: Every day | ORAL | Status: DC
  Administered 2016-07-22 – 2016-07-24 (×3): 1 via ORAL
  Filled 2016-07-22 (×3): qty 1

## 2016-07-22 MED ORDER — B COMPLEX-C PO TABS: 1.0000 | ORAL_TABLET | Freq: Every day | ORAL | Status: DC

## 2016-07-22 MED ORDER — HYPROMELLOSE (GONIOSCOPIC) 2.5 % OP SOLN: 1.0000 [drp] | Freq: Four times a day (QID) | OPHTHALMIC | Status: DC

## 2016-07-22 MED ORDER — LEVOTHYROXINE SODIUM 200 MCG PO TABS: 200.0000 ug | ORAL_TABLET | Freq: Every day | ORAL | Status: DC

## 2016-07-22 MED ORDER — POLYVINYL ALCOHOL 1.4 % OP SOLN
1.0000 [drp] | Freq: Four times a day (QID) | OPHTHALMIC | Status: DC
  Administered 2016-07-22 – 2016-07-24 (×7): 1 [drp] via OPHTHALMIC
  Filled 2016-07-22: qty 15

## 2016-07-22 MED ORDER — VITAMIN C 500 MG PO TABS
500.0000 mg | ORAL_TABLET | Freq: Every day | ORAL | Status: DC
  Administered 2016-07-22 – 2016-07-24 (×3): 500 mg via ORAL
  Filled 2016-07-22 (×3): qty 1

## 2016-07-22 NOTE — NC FL2 (Signed)
[place] LEVEL OF CARE SCREENING TOOL     IDENTIFICATION  Patient Name: Hannah Powell Birthdate: [birth year] Sex: female Admission Date (Current Location): 07/02/2016  [place] and [place] Number:  Engineering geologist and Address:  [facility], [address] [place], [place] [address]      Provider Number: [id number]  Attending Physician Name and Address:  Fritzi Mandes, MD  Relative Name and Phone Number:       Current Level of Care: Hospital Recommended Level of Care: [place] Prior Approval Number:    Date Approved/Denied: 07/22/16 PASRR Number: [id number]  Discharge Plan: SNF    Current Diagnoses: Patient Active Problem List   Diagnosis Date Noted  . Pulmonary infiltrates   . Pneumonia due to Streptococcus pneumoniae ([place])   . Fever   . Community acquired pneumonia of right lung   . Palliative care by specialist   . Goals of care, counseling/discussion   . Pressure injury of skin 07/03/2016  . Acute respiratory failure ([place]) 07/02/2016  . Acute on chronic diastolic CHF (congestive heart failure) ([place]) 07/21/2015  . COPD exacerbation ([place]) 07/21/2015  . Acute renal insufficiency 07/21/2015  . Hyperglycemia 07/21/2015  . Benign essential HTN 07/21/2015  . Acute on chronic respiratory failure with hypoxia and hypercapnia ([place]) 07/18/2015  . S/P shoulder replacement 07/02/2013    Orientation RESPIRATION BLADDER Height & Weight     Self, Time, Situation, Place  O2 (3L o2, chronic) Continent Weight: 245 lb 2.4 oz (111.2 kg) Height:  5\' 5"  (165.1 cm)  BEHAVIORAL SYMPTOMS/MOOD NEUROLOGICAL BOWEL NUTRITION STATUS      Continent Diet (heart healthy)  AMBULATORY STATUS COMMUNICATION OF NEEDS Skin   Extensive Assist Verbally Normal                       Personal Care Assistance Level of Assistance  Bathing, Feeding, Dressing Bathing Assistance: Maximum assistance Feeding assistance:  Independent Dressing Assistance: Maximum assistance     Functional Limitations Info             SPECIAL CARE FACTORS FREQUENCY  PT (By licensed PT)     PT Frequency: Up to 5X per day, 5 days per week              Contractures Contractures Info: Present    Additional Factors Info  Code Status, Allergies Code Status Info: DNR Allergies Info: Lyrica (pregabalin)           Current Medications (07/22/2016):  This is the current hospital active medication list Current Facility-Administered Medications  Medication Dose Route Frequency Provider Last Rate Last Dose  . acetaminophen (TYLENOL) tablet 650 mg  650 mg Oral Q4H PRN Varughese, Bincy S, NP   650 mg at 07/14/16 2103  . albuterol (PROVENTIL) (2.5 MG/3ML) 0.083% nebulizer solution 2.5 mg  2.5 mg Nebulization Q3H PRN Wilhelmina Mcardle, MD      . ALPRAZolam Duanne Moron) tablet 0.25 mg  0.25 mg Oral TID PRN Wilhelmina Mcardle, MD   0.25 mg at 07/20/16 1208  . aspirin chewable tablet 81 mg  81 mg Oral Daily Wilhelmina Mcardle, MD   81 mg at 07/22/16 1006  . bisacodyl (DULCOLAX) suppository 10 mg  10 mg Rectal Daily PRN Awilda Bill, NP   10 mg at 07/13/16 2004  . budesonide (PULMICORT) nebulizer solution 0.25 mg  0.25 mg Nebulization Q6H Wilhelmina Mcardle, MD   0.25 mg at 07/22/16 0739  .  calcium-vitamin D (OSCAL WITH D) 500-200 MG-UNIT per tablet 1 tablet  1 tablet Oral Daily Fritzi Mandes, MD   1 tablet at 07/22/16 1006  . enoxaparin (LOVENOX) injection 40 mg  40 mg Subcutaneous Q24H Dimas Chyle, MD   40 mg at 07/21/16 2149  . famotidine (PEPCID) tablet 20 mg  20 mg Oral BID Hillary Bow, MD   20 mg at 07/22/16 1005  . fluticasone furoate-vilanterol (BREO ELLIPTA) 100-25 MCG/INH 1 puff  1 puff Inhalation Daily Fritzi Mandes, MD   1 puff at 07/22/16 1009  . guaiFENesin-dextromethorphan (ROBITUSSIN DM) 100-10 MG/5ML syrup 5 mL  5 mL Oral Q6H PRN Varughese, Bincy S, NP   5 mL at 07/19/16 2155  . insulin aspart (novoLOG) injection 0-15  Units  0-15 Units Subcutaneous TID WC Anders Simmonds, MD   2 Units at 07/21/16 1705  . insulin aspart (novoLOG) injection 0-5 Units  0-5 Units Subcutaneous QHS Anders Simmonds, MD      . ipratropium-albuterol (DUONEB) 0.5-2.5 (3) MG/3ML nebulizer solution 3 mL  3 mL Nebulization Q6H Wilhelmina Mcardle, MD   3 mL at 07/22/16 0739  . levothyroxine (SYNTHROID, LEVOTHROID) tablet 200 mcg  200 mcg Oral QAC breakfast Wilhelmina Mcardle, MD   200 mcg at 07/22/16 0601  . losartan (COZAAR) tablet 25 mg  25 mg Oral Daily Fritzi Mandes, MD   25 mg at 07/22/16 1006  . neomycin-polymyxin b-dexamethasone (MAXITROL) ophthalmic suspension 1 drop  1 drop Left Eye Q6H Wilhelmina Mcardle, MD   1 drop at 07/22/16 0601  . ondansetron (ZOFRAN) injection 4 mg  4 mg Intravenous Q6H PRN Awilda Bill, NP   4 mg at 07/19/16 0555  . pramipexole (MIRAPEX) tablet 4 mg  4 mg Oral QHS Wilhelmina Mcardle, MD   4 mg at 07/21/16 2304  . predniSONE (DELTASONE) tablet 40 mg  40 mg Oral Q breakfast Wilhelmina Mcardle, MD   40 mg at 07/22/16 8786  . promethazine (PHENERGAN) injection 12.5 mg  12.5 mg Intravenous Q4H PRN Wilhelmina Mcardle, MD   12.5 mg at 07/21/16 1706  . roflumilast (DALIRESP) tablet 500 mcg  500 mcg Oral Daily Fritzi Mandes, MD   500 mcg at 07/22/16 1005  . sodium chloride flush (NS) 0.9 % injection 10-40 mL  10-40 mL Intracatheter Q12H Fritzi Mandes, MD   10 mL at 07/21/16 2306  . sodium chloride flush (NS) 0.9 % injection 10-40 mL  10-40 mL Intracatheter PRN Fritzi Mandes, MD      . traZODone (DESYREL) tablet 100 mg  100 mg Oral QHS Wilhelmina Mcardle, MD   100 mg at 07/21/16 2149  . vitamin C (ASCORBIC ACID) tablet 500 mg  500 mg Oral Daily Lenis Noon, RPH   500 mg at 07/22/16 1006     Discharge Medications: Please see discharge summary for a list of discharge medications.  Relevant Imaging Results:  Relevant Lab Results:   Additional Information SS# [id number]  Zettie Pho, LCSW

## 2016-07-22 NOTE — Clinical Social Work Note (Addendum)
Clinical Social Work Assessment  Patient Details  Name: Hannah Powell MRN: [id number] Date of Birth: September 16, 1945  Date of referral:  07/22/16               Reason for consult:  Facility Placement                Permission sought to share information with:  Chartered certified accountant granted to share information::  Yes, Verbal Permission Granted  Name::        Agency::     Relationship::     Contact Information:     Housing/Transportation Living arrangements for the past 2 months:  Single Family Home (The patient has been inpatient at [facility] for almost 1 month of the past 2 months.) Source of Information:  Medical Team, Spouse Patient Interpreter Needed:  None Criminal Activity/Legal Involvement Pertinent to Current Situation/Hospitalization:  No - Comment as needed Significant Relationships:  Warehouse manager, Social worker, Adult Children, Spouse Lives with:  Spouse Do you feel safe going back to the place where you live?  Yes Need for family participation in patient care:  No (Coment)  Care giving concerns:  PT recommendation for SNF   Social Worker assessment / plan:  CSW spoke with the patient's spouse about discharge planning and introduced role of CSW in such. The patient's husband gave verbal permission to conduct a SNF referral, and he indicated that her 1st preference is [facility], with [facility] as secondary choices. The CSW explained the insurance authorization process as well as the referral process.  The patient just recently transferred off of ICU. The patient's husband would like her to work more with PT before making a final decision; however, he reports that they understand that she will most likely continue needing STR at least due to deconditioning while inpatient. The patient is no longer on a vent.  The CSW has begun the referral process including PASRR and [place] 2. CSW will continue to follow for discharge facilitation and  planning.  Employment status:  Retired Forensic scientist:  Medicare PT Recommendations:  [place] / Referral to community resources:  [place]  Patient/Family's Response to care:  Patient's husband thanked the CSW for assistance.  Patient/Family's Understanding of and Emotional Response to Diagnosis, Current Treatment, and Prognosis:  The patient and her husband understand the potential needs for SNF level of care and are willing to pursue.  Emotional Assessment Appearance:  Appears stated age Attitude/Demeanor/Rapport:  Lethargic Affect (typically observed):  Appropriate Orientation:  Oriented to Self, Oriented to Place, Oriented to  Time, Oriented to Situation Alcohol / Substance use:  Never Used Psych involvement (Current and /or in the community):  No (Comment)  Discharge Needs  Concerns to be addressed:  Care Coordination, Adjustment to Illness, Discharge Planning Concerns Readmission within the last 30 days:  No Current discharge risk:  Chronically ill Barriers to Discharge:  Continued Medical Work up   [employer], LCSW 07/22/2016, 11:44 AM

## 2016-07-22 NOTE — Progress Notes (Signed)
[place] [facility] [place] NAME: Hannah Powell    MR#:  [id number]  DATE OF BIRTH:  03-24-1945  SUBJECTIVE:  Patient got intubated on 07/12/16.---extubated on 07/18/16  Feels weak. Worked with PT yesterday in bed On 3 L o2 Family in the room Cough. Feels a lot better  REVIEW OF SYSTEMS:   Review of Systems  Constitutional: Positive for malaise/fatigue. Negative for chills and fever.  HENT: Negative for sore throat.   Eyes: Negative for blurred vision, double vision and pain.  Respiratory: Positive for cough, shortness of breath and wheezing. Negative for hemoptysis.   Cardiovascular: Negative for chest pain, palpitations, orthopnea and leg swelling.  Gastrointestinal: Negative for abdominal pain, constipation, diarrhea, heartburn, nausea and vomiting.  Genitourinary: Negative for dysuria and hematuria.  Musculoskeletal: Positive for back pain. Negative for joint pain.  Skin: Negative for rash.  Neurological: Negative for sensory change, speech change, focal weakness and headaches.  Endo/Heme/Allergies: Does not bruise/bleed easily.  Psychiatric/Behavioral: Negative for depression. The patient is not nervous/anxious.    DRUG ALLERGIES:   Allergies  Allergen Reactions  . Lyrica [Pregabalin] Other (See Comments)    Reaction: made Restless Leg Syndrome worse     VITALS:  Blood pressure (!) 158/88, pulse 93, temperature 98.5 F (36.9 C), temperature source Axillary, resp. rate (!) 23, height 5\' 5"  (1.651 m), weight 111.2 kg (245 lb 2.4 oz), SpO2 95 %.  PHYSICAL EXAMINATION:   Physical Exam  GENERAL:  71 y.o.-year-old patient lying in the bed. Obese. EYES: Pupils equal, round, reactive to light and accommodation. No scleral icterus. Extraocular muscles intact.  HEENT: Head atraumatic, normocephalic. Oropharynx and nasopharynx clear. -NECK  Supple, no jugular venous distention. No thyroid enlargement, no tenderness.  LUNGS:distant  breath sounds bilaterally, no wheezing,  no rales,no rhonchi.  CARDIOVASCULAR: S1, S2 normal. No murmurs, rubs, or gallops.  ABDOMEN: Soft, nontender, nondistended. Bowel sounds present. No organomegaly or mass.  EXTREMITIES: No cyanosis, clubbing  + edema b/l.    NEUROLOGIC: weak Grossly nonfocal moves all her extremities well Psychiatric: Alert and oriented 3  SKIN: No obvious rash, lesion, or ulcer.   LABORATORY PANEL:  CBC  Recent Labs Lab 07/21/16 0428  WBC 8.7  HGB 11.5*  HCT 35.2  PLT 370    Chemistries   Recent Labs Lab 07/19/16 0522  07/21/16 0428  NA 146*  < > 138  K 2.8*  < > 3.8  CL 92*  < > 93*  CO2 50*  < > 42*  GLUCOSE 97  < > 90  BUN 27*  < > 18  CREATININE 0.50  < > 0.35*  CALCIUM 8.4*  < > 8.4*  MG 1.9  --   --   < > = values in this interval not displayed. Cardiac Enzymes No results for input(s): TROPONINI in the last 168 hours. RADIOLOGY:  Dg Chest Port 1 View  Result Date: 07/21/2016 CLINICAL DATA:  Acute respiratory failure. EXAM: PORTABLE CHEST 1 VIEW COMPARISON:  Radiograph Jul 18, 2016. FINDINGS: Stable cardiomegaly. Endotracheal and nasogastric tubes have been removed. Left internal jugular catheter is again noted with distal tip in expected position of the SVC. Status post left shoulder arthroplasty. No pneumothorax is noted. Stable right upper lobe opacity is noted concerning for pneumonia. Mild bibasilar subsegmental atelectasis or infiltrates are noted with minimal pleural effusions. Bony thorax is unremarkable. IMPRESSION: Endotracheal and nasogastric tubes have been removed. Stable position of left internal jugular catheter. Stable  right upper lobe opacity concerning for possible pneumonia. Stable mild bibasilar subsegmental atelectasis or infiltrates with minimal pleural effusions. Electronically Signed   By: Hannah Powell, M.D.   On: 07/21/2016 07:11   ASSESSMENT AND PLAN:  Hannah Powell with respiratory  distress. She stated on Thursday she started feeling bad. She needed to increase her oxygen from 2 L to 3 L. On Saturday she had shaking chills and fever.   1. Acute on chronic hypoxic/hypercarbic respiratory failure with hypoxia and acidosis.  -Appears combination of severe COPD and CHF acute diastolic (Echo in the past EF 65%) -Patient intubated on  (07/12/16)---now extubated 07/18/2016. On 4 L O2 -Repeat cultures including blood cultures sputum culture no growth from 07/13/2016 -Status post thoracentesis 07/17/2016 - 500 cc of fluid  2. Severe sepsis with Right-sided pneumonia, leukocytosis and tachypnea. IV Vanco and Zosyn were given. Patient currently not on any antibiotics - repeat blood cultures from 4/27 negative  - sputum cultures negative -was on IV solucortef (stress doses) - Stopped  3. Acute kidney injury due to ATN from pneumonia and sepsis. -creatinine improving Resolved  4. Acute on chronic COPD on chronic oxygen. -management as above  5.History of obstructive sleep apnea   6.  History of Essential hypertension patient was hypotensive on IV levophed. -resume losartan  7. Hypothyroidism -on synthroid    8. Palliative care on board -Patient remains DO NOT RESUSCITATE  Pt being transferred out to the floor CSW for d/c planning  Discussed with patient and daughter at bedside.  CODE STATUS: DNR  DVT Prophylaxis: lovenox  TOTAL TIME TAKING CARE OF THIS PATIENT: 30 minutes.    Hannah Powell M.D on 07/22/2016 at 8:56 AM  Between 7am to 6pm - Pager - [phone]  After 6pm go to [email or web address] - password [email or web address] [facility]  Office  [phone]  CC: Primary care physician; Hannah Hector, MD   Note: This dictation was prepared with Dragon dictation along with smaller phrase technology. Any transcriptional errors that result from this process are unintentional.

## 2016-07-22 NOTE — Plan of Care (Signed)
Problem: Activity: Goal: Risk for activity intolerance will decrease Outcome: Not Progressing Pt unable to tolerate transfer out of bed.

## 2016-07-22 NOTE — Clinical Social Work Note (Signed)
CSW received consult for nursing home placement. CSW is waiting for the PT note to be entered prior to beginning a bed search as said note is necessary for insurance authorization.  Santiago Bumpers, MSW, Latanya Presser [phone]

## 2016-07-23 LAB — GLUCOSE, CAPILLARY
GLUCOSE-CAPILLARY: 122 mg/dL — AB (ref 65–99)
GLUCOSE-CAPILLARY: 118 mg/dL — AB (ref 65–99)
GLUCOSE-CAPILLARY: 134 mg/dL — AB (ref 65–99)
GLUCOSE-CAPILLARY: 117 mg/dL — AB (ref 65–99)

## 2016-07-23 MED ORDER — PRAMIPEXOLE DIHYDROCHLORIDE 1 MG PO TABS
4.0000 mg | ORAL_TABLET | Freq: Every day | ORAL | Status: DC
  Administered 2016-07-23: 20:00:00 4 mg via ORAL
  Filled 2016-07-23: qty 4

## 2016-07-23 MED ORDER — FUROSEMIDE 20 MG PO TABS
20.0000 mg | ORAL_TABLET | Freq: Every day | ORAL | Status: DC
  Administered 2016-07-24: 20 mg via ORAL
  Filled 2016-07-23: qty 1

## 2016-07-23 NOTE — Progress Notes (Signed)
Nutrition Follow-up  DOCUMENTATION CODES:   Morbid obesity  INTERVENTION:  Continue calorie- and protein-dense snacks 2-3 times daily between meals (food brought from home).  Patient declined oral nutrition supplements recommended by RD.  NUTRITION DIAGNOSIS:   Inadequate oral intake related to acute illness as evidenced by per patient/family report.  Ongoing inadequate intake though patient reports improving appetite.  GOAL:   Patient will meet greater than or equal to 90% of their needs  Progressing.  MONITOR:   I & O's, Diet advancement, Labs, Supplement acceptance  REASON FOR ASSESSMENT:   Ventilator, Consult Enteral/tube feeding initiation and management  ASSESSMENT:   71 yo female admitted with acute respiratory failure and sepsis with pneumonia on 4/16, on and off Bipap, ultimately requiring intubation on 4/26.  Pt with hx of CHF, COPD, HTN  -Pt s/p thoracentesis on 5/1 with 500cc removed. -Patient extubated 07/18/2016 and OGT was removed. -Advanced to FLD on 5/3 and Heart Healthy/CHO Modified diet on 5/4.  -PMT following patient - was changed to DNR.  Spoke with patient at bedside. She reports her appetite is very good and she believes it is back to baseline. She had 100% of her breakfast this morning (only 220 kcal, 7 grams of protein) but reports prior to today she had been finishing about 20% of meals. She has many snacks in room (apples, peanut butter, peanuts, chips) that she reports are brought in from home and she eats 2-3 times per day between meals. Denies any N/V, abdominal pain, constipation/diarrhea, or difficulty chewing/swallowing. Patient reports she does not want any oral nutrition supplements offered by RD to help meet calorie/protein needs. States she will continue with her snacks, instead.  Medications reviewed and include: Oscal with D, famotidine, Novolog sliding scale TID with meals and QHS, levothyroxine, prednisone 40 mg daily, vitamin C 500 mg  daily.  Labs reviewed: CBG 122-144 past 24 hrs.  I/O: 3 occurrences unmeasured UOP past 24 hrs  Weight trend: RD obtained bed scale weight of 252 lbs (114.5 kg), which is +3.3 kg from last weight on 5/2  Diet Order:  Diet heart healthy/carb modified Room service appropriate? Yes; Fluid consistency: Thin  Skin:  Wound (see comment) (stage II sacrum)  Last BM:  07/22/2016 - type 6 per chart  Height:   Ht Readings from Last 1 Encounters:  07/08/16 5\' 5"  (1.651 m)    Weight:   Wt Readings from Last 1 Encounters:  07/23/16 252 lb (114.3 kg)    Ideal Body Weight:  56.81 kg  BMI:  Body mass index is 41.93 kg/m.  Estimated Nutritional Needs:   Kcal:  2050-2280 (18-20 kcal/kg)  Protein:  100-115 grams (0.9-1 grams/kg)  Fluid:  2 L/day  EDUCATION NEEDS:   No education needs identified at this time  Willey Blade, MS, RD, LDN Pager: [phone] After Hours Pager: [phone]

## 2016-07-23 NOTE — Clinical Social Work Note (Signed)
Pt will discharge to [facility]. [employer] Josem Kaufmann is pending. OT has been requested and needed for auth. MD updated. CSW will continue to follow.   Darden Dates, MSW, LCSW  Clinical Social Worker  [phone]

## 2016-07-23 NOTE — Progress Notes (Signed)
[place] at [place] NAME: Hannah Powell    MR#:  [id number]  DATE OF BIRTH:  Sep 27, 1945  SUBJECTIVE:  Patient got intubated on 07/12/16.---extubated on 07/18/16  Feels weak. Physical therapy started On 3 L o2 Family in the room Cough. Feels a lot better  REVIEW OF SYSTEMS:   Review of Systems  Constitutional: Positive for malaise/fatigue. Negative for chills and fever.  HENT: Negative for sore throat.   Eyes: Negative for blurred vision, double vision and pain.  Respiratory: Positive for cough, shortness of breath and wheezing. Negative for hemoptysis.   Cardiovascular: Negative for chest pain, palpitations, orthopnea and leg swelling.  Gastrointestinal: Negative for abdominal pain, constipation, diarrhea, heartburn, nausea and vomiting.  Genitourinary: Negative for dysuria and hematuria.  Musculoskeletal: Positive for back pain. Negative for joint pain.  Skin: Negative for rash.  Neurological: Negative for sensory change, speech change, focal weakness and headaches.  Endo/Heme/Allergies: Does not bruise/bleed easily.  Psychiatric/Behavioral: Negative for depression. The patient is not nervous/anxious.    DRUG ALLERGIES:   Allergies  Allergen Reactions  . Lyrica [Pregabalin] Other (See Comments)    Reaction: made Restless Leg Syndrome worse     VITALS:  Blood pressure 135/71, pulse 94, temperature 98.3 F (36.8 C), temperature source Oral, resp. rate 20, height 5\' 5"  (1.651 m), weight 114.3 kg (252 lb), SpO2 94 %.  PHYSICAL EXAMINATION:   Physical Exam  GENERAL:  71 y.o.-year-old patient lying in the bed. Obese. EYES: Pupils equal, round, reactive to light and accommodation. No scleral icterus. Extraocular muscles intact.  HEENT: Head atraumatic, normocephalic. Oropharynx and nasopharynx clear. -NECK  Supple, no jugular venous distention. No thyroid enlargement, no tenderness.  LUNGS:distant breath sounds bilaterally, no  wheezing,  no rales,no rhonchi.  CARDIOVASCULAR: S1, S2 normal. No murmurs, rubs, or gallops.  ABDOMEN: Soft, nontender, nondistended. Bowel sounds present. No organomegaly or mass.  EXTREMITIES: No cyanosis, clubbing  + edema b/l.    NEUROLOGIC: weak Grossly nonfocal moves all Hannah extremities well Psychiatric: Alert and oriented 3  SKIN: No obvious rash, lesion, or ulcer.   LABORATORY PANEL:  CBC  Recent Labs Lab 07/21/16 0428  WBC 8.7  HGB 11.5*  HCT 35.2  PLT 370    Chemistries   Recent Labs Lab 07/19/16 0522  07/21/16 0428  NA 146*  < > 138  K 2.8*  < > 3.8  CL 92*  < > 93*  CO2 50*  < > 42*  GLUCOSE 97  < > 90  BUN 27*  < > 18  CREATININE 0.50  < > 0.35*  CALCIUM 8.4*  < > 8.4*  MG 1.9  --   --   < > = values in this interval not displayed. Cardiac Enzymes No results for input(s): TROPONINI in the last 168 hours. RADIOLOGY:  No results found. ASSESSMENT AND PLAN:  Hannah Powell a 71 y.o.femalepresented with respiratory distress. She stated on Thursday she started feeling bad. She needed to increase Hannah oxygen from 2 L to 3 L. On Saturday she had shaking chills and fever.   1. Acute on chronic hypoxic/hypercarbic respiratory failure with hypoxia and acidosis.  -Appears combination of severe COPD and CHF acute diastolic (Echo in the past EF 65%) -Patient intubated on  (07/12/16)---now extubated 07/18/2016. On 4 L O2 -Repeat cultures including blood cultures sputum culture no growth from 07/13/2016 -Status post thoracentesis 07/17/2016 - 500 cc of fluid -Patient resume back on Lasix 20 mg daily -  She will be using BiPAP every night. Hannah settings have been given. Rehabilitation place has made arrangements for BiPAP.  2. Severe sepsis with Right-sided pneumonia, leukocytosis and tachypnea. IV Vanco and Zosyn were given. Patient currently not on any antibiotics - repeat blood cultures from 4/27 negative  - sputum cultures negative -was on IV solucortef  (stress doses) - Stopped  3. Acute kidney injury due to ATN from pneumonia and sepsis. -creatinine improving Resolved  4. Acute on chronic COPD on chronic oxygen. -management as above  5.History of obstructive sleep apnea   6.  History of Essential hypertension patient was hypotensive on IV levophed. -resume losartan  7. Hypothyroidism -on synthroid    8. Palliative care on board -Patient remains DO NOT RESUSCITATE   CSW for d/c planning. Insurance authorization pending. Patient will discharge to [facility] once insurance authorization obtained. This was discussed with patient and Hannah Powell in the room   CODE STATUS: DNR  DVT Prophylaxis: lovenox  TOTAL TIME TAKING CARE OF THIS PATIENT: 30 minutes.    Glynn Yepes M.D on 07/23/2016 at 3:45 PM  Between 7am to 6pm - Pager - [phone]  After 6pm go to [email or web address] - password [email or web address] [facility]  Office  [phone]  CC: Primary care physician; Adin Hector, MD   Note: This dictation was prepared with Dragon dictation along with smaller phrase technology. Any transcriptional errors that result from this process are unintentional.

## 2016-07-23 NOTE — Progress Notes (Signed)
PT PROFILE: 71 y.o. F with moderate to severe COPD, chronic oxygen therapy, moderately impaired at baseline. Admitted 07/02/16 with acute on chronic hypoxemic/hypercarbic respiratory failure due to severe right-sided pneumonia. Pneumococcal urine antigen was positive. Patient was initially supported with noninvasive ventilation. She was transferred out of the intensive care unit on the day following admission but returned shortly thereafter with worsening hypercarbic respiratory failure. Was transferred out and returned once again several days later. Ultimately was intubated 04/26 and extubated 05/2  SUBJ: Improved. Denies dyspnea. No new complaints.   PHYSICAL EXAMINATION: General: well developed, well nourished, NAD  Neuro: No focal deficits, cranial nerves intact, diffusely weak, cognition intact HEENT: NCAT, sclerae white Cardiovascular: Regular, no murmurs Lungs: Diminished throughout, no wheezes Abdomen: Soft, nontender, bowel sounds present Extremities: Warm, 2+ bilateral lower extremity edema  Skin: no rashes or lesions, scattered ecchymosis   LABS: BMP Latest Ref Rng & Units 07/21/2016 07/20/2016 07/19/2016  Glucose 65 - 99 mg/dL 90 100(H) 146(H)  BUN 6 - 20 mg/dL 18 21(H) 25(H)  Creatinine 0.44 - 1.00 mg/dL 0.35(L) 0.53 0.49  Sodium 135 - 145 mmol/L 138 140 148(H)  Potassium 3.5 - 5.1 mmol/L 3.8 3.5 3.5  Chloride 101 - 111 mmol/L 93(L) 92(L) 95(L)  CO2 22 - 32 mmol/L 42(H) 45(H) 48(H)  Calcium 8.9 - 10.3 mg/dL 8.4(L) 8.4(L) 8.9   CBC Latest Ref Rng & Units 07/21/2016 07/20/2016 07/19/2016  WBC 3.6 - 11.0 K/uL 8.7 8.5 7.3  Hemoglobin 12.0 - 16.0 g/dL 11.5(L) 11.3(L) 10.9(L)  Hematocrit 35.0 - 47.0 % 35.2 35.8 33.1(L)  Platelets 150 - 440 K/uL 370 355 303     CXR: No significant change  IMPRESSION: Acute on chronic hypoxemic, hypercarbic respiratory failure - mproving Moderate to severe COPD at baseline Severe metabolic alkalosis secondary to loop diuresis -  improving Pneumococcal pneumonia - treated and nearly fully resolved R lung infiltrate R pleural effusion - S/P thoracentesis 05/01. 500cc removed. Fluid c/w transudate Mild hyperglycemia without prior history of diabetes Severe deconditioning due to prolonged hospitalization Hypotension - resolved Acute delirium - resolved Hypokalemia, recurrent  PLAN/REC: Supplemental O2 to keep saturation greater than 90% Nocturnal BiPAP Continue nebulized steroids and bronchodilators Monitor BMET intermittently Monitor I/Os Correct electrolytes as indicated Discontinue sliding scale insulin  Monitor glucose on chemistry panels PT/OT involved Advance activity as able    Patient satisfied with Plan of action and management. All questions answered  Corrin Parker, M.D.  [facility]  Medical Director [place] Director [facility] Department

## 2016-07-23 NOTE — Evaluation (Signed)
Occupational Therapy Evaluation Patient Details Name: Hannah Powell MRN: [id number] DOB: [birth year] Today's Date: 07/23/2016    History of Present Illness Pt admitted to the CCU 07/02/16 for acute respiratory failure with hypoxia on continuous BiPAP. Pt moved to med surg on 07/03/16 on 3L O2. Pt moved to CCU on 07/04/16 after RR called for acute on chronic hypercapnic respiratory failure requiring continuous BiPAP. Pt returned to med surg on 4/21, but moved back to CCU on 4/22 for increased respiratory distress with hypoxemia and back on BiPAP. Pt intubated on 07/12/16 and extubated on 07/18/16. New PT orders recieved on 07/18/16 for re-eval and pt currently on 4L O2. Pt has PMH of anxiety, arthritis, CHF, COPD, HTN, hypothyroidism, restless leg syndrome, sleep apnea, s/p back surgery (cervical), foot arthroplasty, L total shoulder arthroplasty, and TKR. Pt wears 3L of O2 PRN at baseline.    Clinical Impression   Pt seen for OT evaluation this date. Pt agreeable to OT, despite noting at start of session that she has been feeling "a little depressed." Pt notes she has mentioned this to others on her healthcare team. Pt was independent with ADL, and IADL including med mgt, meals, light cleaning at baseline, was not driving, and was using loftstrand crutches for ambulation at baseline. Pt enjoys reading, watching tv, loves [place] sports teams, and enjoys participating in water aerobics classes 3-4x/wk. Pt presents with impaired cardiopulmonary status, a hx of 6 falls in past 12 months per pt report, and deficits in strength, activity tolerance, and knowledge of strategies and AE/AD for self care tasks. Pt will benefit from skilled OT services to address noted impairments and functional deficits including education/training in AE for self care tasks, energy conservation and task simplification strategies, and falls prevention education in order to maximize return to PLOF and minimize future falls risk and risk  of injury and rehospitalization. Pt limited during today's session due to fatigue and dizziness/lightheadedness/nausea. Pt is a good candidate for STR to continue addressing OT goals once medically appropriate.    Follow Up Recommendations  SNF    Equipment Recommendations  Other (comment) (defer to next venue of care)    Recommendations for Other Services       Precautions / Restrictions Precautions Precautions: Fall Restrictions Weight Bearing Restrictions: No      Mobility Bed Mobility               General bed mobility comments: did not attempt, pt limited by fatigue, dizziness, nausea  Transfers                 General transfer comment: did not attempt, pt limited by fatigue, dizziness, nausea    Balance                                           ADL either performed or assessed with clinical judgement   ADL Overall ADL's : Needs assistance/impaired Eating/Feeding: Bed level;Set up   Grooming: Set up;Bed level   Upper Body Bathing: Bed level;Moderate assistance   Lower Body Bathing: Bed level;Maximal assistance   Upper Body Dressing : Minimal assistance;Set up;Bed level   Lower Body Dressing: Maximal assistance;Bed level                 General ADL Comments: pt generally max assist for LB ADL     Vision   Additional Comments: vision not  tested at this time due to nausea/dizziness; of note, bruising around L eye, pt reports this does not negatively impact her vision in the L eye though     Perception     Praxis      Pertinent Vitals/Pain Pain Assessment: No/denies pain     Hand Dominance Right   Extremity/Trunk Assessment Upper Extremity Assessment Upper Extremity Assessment: Generalized weakness (grossly 4-/5 bilaterally, grip strength WFL)   Lower Extremity Assessment Lower Extremity Assessment: Defer to PT evaluation;Generalized weakness   Cervical / Trunk Assessment Cervical / Trunk Assessment:  Kyphotic   Communication Communication Communication: No difficulties   Cognition Arousal/Alertness: Awake/alert Behavior During Therapy: WFL for tasks assessed/performed Overall Cognitive Status: Within Functional Limits for tasks assessed                                     General Comments  bruising on R forearm, around L eye noted    Exercises Other Exercises Other Exercises: Pt educated in energy conservation strategies with handout provided, including home/routines modifications to support functional independence, safety/falls prevention Other Exercises: Pt educated in body positioning to minimize risk of lightheadedness/syncope and falls with positional changes    Shoulder Instructions      Home Living Family/patient expects to be discharged to:: Skilled nursing facility Living Arrangements: Spouse/significant other Available Help at Discharge: Available 24 hours/day;Family Type of Home: House Home Access: Stairs to enter [employer] of Steps: 5 Entrance Stairs-Rails: Can reach both Home Layout: One level     Bathroom Shower/Tub: Walk-in shower;Door   Bathroom Toilet: Handicapped height (has toilet riser) Bathroom Accessibility: Yes How Accessible: Accessible via wheelchair;Accessible via walker Home Equipment: Crutches;Walker - standard;Bedside commode;Hand held shower head;Grab bars - tub/shower;Grab bars - toilet;Toilet riser          Prior Functioning/Environment Level of Independence: Independent with assistive device(s)        Comments: ambulates using loftstrand crutches. Reports she typically only ambulates 10-12' in home. She then reports she is able to ambulate outside as well. (Per prior PT note); pt reports indep with med mgt, able to do light meal prep, has someone come clean the house 1x/mo, and indep with ADL, not driving; pt endorses 6 falls in past 12 months stating "I go to stand up and get lightheaded and just pass out"         OT Problem List: Decreased strength;Cardiopulmonary status limiting activity;Decreased activity tolerance;Impaired balance (sitting and/or standing);Decreased knowledge of use of DME or AE      OT Treatment/Interventions: Self-care/ADL training;Therapeutic exercise;Therapeutic activities;Energy conservation;DME and/or AE instruction;Patient/family education    OT Goals(Current goals can be found in the care plan section) Acute Rehab OT Goals Patient Stated Goal: to get stronger  OT Goal Formulation: With patient Time For Goal Achievement: 08/06/16 Potential to Achieve Goals: Good  OT Frequency: Min 2X/week   Barriers to D/C: Inaccessible home environment          Co-evaluation              AM-PAC PT "6 Clicks" Daily Activity     Outcome Measure Help from another person eating meals?: None Help from another person taking care of personal grooming?: None Help from another person toileting, which includes using toliet, bedpan, or urinal?: A Lot Help from another person bathing (including washing, rinsing, drying)?: A Lot Help from another person to put on and taking off regular upper  body clothing?: A Little Help from another person to put on and taking off regular lower body clothing?: A Lot 6 Click Score: 17   End of Session    Activity Tolerance: Patient limited by fatigue;Other (comment) (also limited by nausea, dizziness) Patient left: in bed;with call bell/phone within reach;with bed alarm set  OT Visit Diagnosis: Repeated falls (R29.6);Muscle weakness (generalized) (M62.81);History of falling (Z91.81);Dizziness and giddiness (R42)                Time: 8550-1586 OT Time Calculation (min): 29 min Charges:  OT General Charges $OT Visit: 1 Procedure OT Evaluation $OT Eval Low Complexity: 1 Procedure OT Treatments $Self Care/Home Management : 8-22 mins G-Codes:     Jeni Salles, MPH, MS, OTR/L ascom [phone] 07/23/16, 3:52 PM

## 2016-07-23 NOTE — Progress Notes (Signed)
Pt. pulled bipap mask off in her sleep. O2 sat dropped into 40's.  Replaced mask & increased O2 bleedin flow to 15+ lpm in order to raise sat to 89. Discussed with RN who will continue to check on her.

## 2016-07-23 NOTE — Care Management Important Message (Signed)
Important Message  Patient Details  Name: Hannah Powell MRN: [id number] Date of Birth: Feb 18, 1946   Medicare Important Message Given:  Yes    Shelbie Ammons, RN 07/23/2016, 9:39 AM

## 2016-07-23 NOTE — Progress Notes (Signed)
Physical Therapy Treatment Patient Details Name: Hannah Powell MRN: [id number] DOB: Jul 22, 1945 Today's Date: 07/23/2016    History of Present Illness Pt admitted to the CCU 07/02/16 for acute respiratory failure with hypoxia on continuous BiPAP. Pt moved to med surg on 07/03/16 on 3L O2. Pt moved to CCU on 07/04/16 after RR called for acute on chronic hypercapnic respiratory failure requiring continuous BiPAP. Pt returned to med surg on 4/21, but moved back to CCU on 4/22 for increased respiratory distress with hypoxemia and back on BiPAP. Pt intubated on 07/12/16 and extubated on 07/18/16. New PT orders recieved on 07/18/16 for re-eval and pt currently on 4L O2. Pt has PMH of anxiety, arthritis, CHF, COPD, HTN, hypothyroidism, restless leg syndrome, sleep apnea, s/p back surgery (cervical), foot arthroplasty, L total shoulder arthroplasty, and TKR. Pt wears 3L of O2 PRN at baseline.     PT Comments    Pt was in bed watching TV upon entry. Pt was very pleasant and agreeable to PT session. Pt required mod assist of 1 to max assist of 2 for bed mobility and max assist of 2 for 3 attempts to stand with RW. Pt tolerated bed level exercises well. O2 sats dropped to 88% on 3L after 15 reps of ankle pumps. Put the pt on 4L O2 (RN notified) and pt performed remainder of exercises with O2 sats >90%. Pt dropped to 88% on 4L O2 after sitting EOB, but quickly returned to 91% with pursed lip breathing. Pt's O2 sats remained above 90% for the remainder of the session on 4L O2 with mobility and stayed steady at 91% on 3L resting in bed at end of session. PT will attempt to transfer to the chair during next session and will continue with exercise program. Current recommendations remain appropriate.   Follow Up Recommendations  SNF     Equipment Recommendations  Rolling walker with 5" wheels;3in1 (PT)    Recommendations for Other Services       Precautions / Restrictions Precautions Precautions:  Fall Restrictions Weight Bearing Restrictions: No    Mobility  Bed Mobility Overal bed mobility: Needs Assistance Bed Mobility: Sidelying to Sit     Supine to sit: Mod assist Sit to supine: Max assist;+2 for physical assistance   General bed mobility comments: Pt required mod assist of 1 for trunk to sit EOB and max assist of 2 to lie back down and slide up in bed d/t fatgue  Transfers Overall transfer level: Needs assistance Equipment used: Rolling walker (2 wheeled) Transfers: Sit to/from Stand Sit to Stand: Max assist;+2 physical assistance         General transfer comment: pt attempted to stand x3 with max assist of 2; v/c's provided to put her feet undernieth her and put her nose over her toes and lean forward before trying to stand all the way up, pt able to perform this on last trial, but unable to push through her legs to stand all the way up; pt reported that she feels too weak and is scared of falling  Ambulation/Gait Ambulation/Gait assistance:  (not appropriate at this time)               Stairs            Wheelchair Mobility    Modified Rankin (Stroke Patients Only)       Balance Overall balance assessment: Needs assistance Sitting-balance support: Single extremity supported;Feet supported Sitting balance-Leahy Scale: Fair Sitting balance - Comments: pt required supervision when  sitting EOB                                     Cognition Arousal/Alertness: Awake/alert Behavior During Therapy: WFL for tasks assessed/performed Overall Cognitive Status: Within Functional Limits for tasks assessed                                        Exercises General Exercises - Lower Extremity Ankle Circles/Pumps: AROM;Strengthening;Both;15 reps;Supine Heel Slides: Strengthening;Both;10 reps;AROM;Supine Straight Leg Raises: AAROM;Both;10 reps;Supine;Strengthening     General Comments General comments (skin integrity,  edema, etc.): bruising on R forearm, around L eye noted      Pertinent Vitals/Pain Pain Assessment: No/denies pain           PT Goals (current goals can now be found in the care plan section) Acute Rehab PT Goals Patient Stated Goal: to get stronger  PT Goal Formulation: With patient Time For Goal Achievement: 08/06/16 Potential to Achieve Goals: Good Progress towards PT goals: Progressing toward goals    Frequency    Min 2X/week      PT Plan Current plan remains appropriate    Co-evaluation              AM-PAC PT "6 Clicks" Daily Activity  Outcome Measure  Difficulty turning over in bed (including adjusting bedclothes, sheets and blankets)?: Total Difficulty moving from lying on back to sitting on the side of the bed? : Total Difficulty sitting down on and standing up from a chair with arms (e.g., wheelchair, bedside commode, etc,.)?: Total Help needed moving to and from a bed to chair (including a wheelchair)?: Total Help needed walking in hospital room?: Total Help needed climbing 3-5 steps with a railing? : Total 6 Click Score: 6    End of Session Equipment Utilized During Treatment: Gait belt;Oxygen (3L resting in bed, 4L for mobility) Activity Tolerance: Patient limited by fatigue Patient left: in bed;with bed alarm set;with call bell/phone within reach (on 3L O2 ) Nurse Communication: Mobility status;Precautions (O2 to 4L with mobility, O2 sats dropped to 88% x2) PT Visit Diagnosis: Difficulty in walking, not elsewhere classified (R26.2);Muscle weakness (generalized) (M62.81)     Time: 0233-4356 PT Time Calculation (min) (ACUTE ONLY): 33 min  Charges:                       G Codes:         Hannah Powell, SPT 07/23/2016, 4:13 PM

## 2016-07-24 DIAGNOSIS — I10 Essential (primary) hypertension: Secondary | ICD-10-CM | POA: Diagnosis not present

## 2016-07-24 DIAGNOSIS — J449 Chronic obstructive pulmonary disease, unspecified: Secondary | ICD-10-CM | POA: Diagnosis not present

## 2016-07-24 DIAGNOSIS — R1312 Dysphagia, oropharyngeal phase: Secondary | ICD-10-CM | POA: Diagnosis not present

## 2016-07-24 DIAGNOSIS — G2581 Restless legs syndrome: Secondary | ICD-10-CM | POA: Diagnosis not present

## 2016-07-24 DIAGNOSIS — Z79899 Other long term (current) drug therapy: Secondary | ICD-10-CM | POA: Diagnosis not present

## 2016-07-24 DIAGNOSIS — R4182 Altered mental status, unspecified: Secondary | ICD-10-CM | POA: Diagnosis not present

## 2016-07-24 DIAGNOSIS — J9622 Acute and chronic respiratory failure with hypercapnia: Secondary | ICD-10-CM | POA: Diagnosis not present

## 2016-07-24 DIAGNOSIS — J9600 Acute respiratory failure, unspecified whether with hypoxia or hypercapnia: Secondary | ICD-10-CM | POA: Diagnosis not present

## 2016-07-24 DIAGNOSIS — M6281 Muscle weakness (generalized): Secondary | ICD-10-CM | POA: Diagnosis not present

## 2016-07-24 DIAGNOSIS — Z66 Do not resuscitate: Secondary | ICD-10-CM | POA: Diagnosis present

## 2016-07-24 DIAGNOSIS — J90 Pleural effusion, not elsewhere classified: Secondary | ICD-10-CM | POA: Diagnosis not present

## 2016-07-24 DIAGNOSIS — R531 Weakness: Secondary | ICD-10-CM | POA: Diagnosis not present

## 2016-07-24 DIAGNOSIS — G4700 Insomnia, unspecified: Secondary | ICD-10-CM | POA: Diagnosis not present

## 2016-07-24 DIAGNOSIS — I5031 Acute diastolic (congestive) heart failure: Secondary | ICD-10-CM | POA: Diagnosis not present

## 2016-07-24 DIAGNOSIS — E876 Hypokalemia: Secondary | ICD-10-CM | POA: Diagnosis present

## 2016-07-24 DIAGNOSIS — N390 Urinary tract infection, site not specified: Secondary | ICD-10-CM | POA: Diagnosis not present

## 2016-07-24 DIAGNOSIS — E870 Hyperosmolality and hypernatremia: Secondary | ICD-10-CM | POA: Diagnosis not present

## 2016-07-24 DIAGNOSIS — D649 Anemia, unspecified: Secondary | ICD-10-CM | POA: Diagnosis not present

## 2016-07-24 DIAGNOSIS — J189 Pneumonia, unspecified organism: Secondary | ICD-10-CM | POA: Diagnosis not present

## 2016-07-24 DIAGNOSIS — Z888 Allergy status to other drugs, medicaments and biological substances status: Secondary | ICD-10-CM | POA: Diagnosis not present

## 2016-07-24 DIAGNOSIS — I509 Heart failure, unspecified: Secondary | ICD-10-CM | POA: Diagnosis not present

## 2016-07-24 DIAGNOSIS — I110 Hypertensive heart disease with heart failure: Secondary | ICD-10-CM | POA: Diagnosis not present

## 2016-07-24 DIAGNOSIS — I700 Atherosclerosis of aorta: Secondary | ICD-10-CM | POA: Diagnosis present

## 2016-07-24 DIAGNOSIS — Z7982 Long term (current) use of aspirin: Secondary | ICD-10-CM | POA: Diagnosis not present

## 2016-07-24 DIAGNOSIS — J9601 Acute respiratory failure with hypoxia: Secondary | ICD-10-CM | POA: Diagnosis not present

## 2016-07-24 DIAGNOSIS — R0689 Other abnormalities of breathing: Secondary | ICD-10-CM | POA: Diagnosis not present

## 2016-07-24 DIAGNOSIS — R498 Other voice and resonance disorders: Secondary | ICD-10-CM | POA: Diagnosis not present

## 2016-07-24 DIAGNOSIS — R0600 Dyspnea, unspecified: Secondary | ICD-10-CM | POA: Diagnosis not present

## 2016-07-24 DIAGNOSIS — Z87891 Personal history of nicotine dependence: Secondary | ICD-10-CM | POA: Diagnosis not present

## 2016-07-24 DIAGNOSIS — Z7401 Bed confinement status: Secondary | ICD-10-CM | POA: Diagnosis not present

## 2016-07-24 DIAGNOSIS — I2510 Atherosclerotic heart disease of native coronary artery without angina pectoris: Secondary | ICD-10-CM | POA: Diagnosis not present

## 2016-07-24 DIAGNOSIS — F419 Anxiety disorder, unspecified: Secondary | ICD-10-CM | POA: Diagnosis present

## 2016-07-24 DIAGNOSIS — J9610 Chronic respiratory failure, unspecified whether with hypoxia or hypercapnia: Secondary | ICD-10-CM | POA: Diagnosis not present

## 2016-07-24 DIAGNOSIS — J9621 Acute and chronic respiratory failure with hypoxia: Secondary | ICD-10-CM | POA: Diagnosis not present

## 2016-07-24 DIAGNOSIS — G931 Anoxic brain damage, not elsewhere classified: Secondary | ICD-10-CM | POA: Diagnosis not present

## 2016-07-24 DIAGNOSIS — Z96612 Presence of left artificial shoulder joint: Secondary | ICD-10-CM | POA: Diagnosis present

## 2016-07-24 DIAGNOSIS — I5032 Chronic diastolic (congestive) heart failure: Secondary | ICD-10-CM | POA: Diagnosis not present

## 2016-07-24 DIAGNOSIS — I5033 Acute on chronic diastolic (congestive) heart failure: Secondary | ICD-10-CM | POA: Diagnosis not present

## 2016-07-24 DIAGNOSIS — Z8249 Family history of ischemic heart disease and other diseases of the circulatory system: Secondary | ICD-10-CM | POA: Diagnosis not present

## 2016-07-24 DIAGNOSIS — R0902 Hypoxemia: Secondary | ICD-10-CM | POA: Diagnosis not present

## 2016-07-24 DIAGNOSIS — J690 Pneumonitis due to inhalation of food and vomit: Secondary | ICD-10-CM | POA: Diagnosis not present

## 2016-07-24 DIAGNOSIS — J810 Acute pulmonary edema: Secondary | ICD-10-CM | POA: Diagnosis not present

## 2016-07-24 DIAGNOSIS — G4733 Obstructive sleep apnea (adult) (pediatric): Secondary | ICD-10-CM | POA: Diagnosis present

## 2016-07-24 DIAGNOSIS — E039 Hypothyroidism, unspecified: Secondary | ICD-10-CM | POA: Diagnosis not present

## 2016-07-24 DIAGNOSIS — J441 Chronic obstructive pulmonary disease with (acute) exacerbation: Secondary | ICD-10-CM | POA: Diagnosis not present

## 2016-07-24 LAB — GLUCOSE, CAPILLARY
Glucose-Capillary: 106 mg/dL — ABNORMAL HIGH (ref 65–99)
Glucose-Capillary: 141 mg/dL — ABNORMAL HIGH (ref 65–99)

## 2016-07-24 MED ORDER — PREDNISONE 20 MG PO TABS: 40.0000 mg | ORAL_TABLET | Freq: Every day | ORAL | Status: DC

## 2016-07-24 MED ORDER — FUROSEMIDE 20 MG PO TABS: 20.0000 mg | ORAL_TABLET | Freq: Every day | ORAL | Status: DC

## 2016-07-24 MED ORDER — NEOMYCIN-POLYMYXIN-DEXAMETH 3.5-10000-0.1 OP SUSP: 1.0000 [drp] | Freq: Four times a day (QID) | OPHTHALMIC | Status: DC

## 2016-07-24 MED ORDER — HYDROCOD POLST-CPM POLST ER 10-8 MG/5ML PO SUER: 5.0000 mL | Freq: Two times a day (BID) | ORAL | 0 refills | Status: DC | PRN

## 2016-07-24 MED ORDER — TRAZODONE HCL 100 MG PO TABS: 100.0000 mg | ORAL_TABLET | Freq: Every day | ORAL | Status: DC

## 2016-07-24 NOTE — Progress Notes (Signed)
Patient transported via EMS to [facility]. IV removed and tolerated with no c/o pain or discomfort. Family at bedside and will accompany patient to facility.

## 2016-07-24 NOTE — Clinical Social Work Note (Signed)
Pt is ready for discharge today and will go to [facility]. [employer] Josem Kaufmann has been obtained. Facility is ready to admit pt as they have received discharge information. Pt is aware and in agreement. Pt will update her family as they stepped out of the room. RN will call report. [facility] will provide transportation. CSW is signing off as no further needs identified.   Darden Dates, MSW, LCSW  Clinical Social Worker  [phone]

## 2016-07-24 NOTE — Clinical Social Work Placement (Signed)
   CLINICAL SOCIAL WORK PLACEMENT  NOTE  Date:  07/24/2016  Patient Details  Name: Hannah Powell MRN: [id number] Date of Birth: [birth year]  Clinical Social Work is seeking post-discharge placement for this patient at the [place] level of care (*CSW will initial, date and re-position this form in  chart as items are completed):  Yes   Patient/family provided with [place] Work Department's list of facilities offering this level of care within the geographic area requested by the patient (or if unable, by the patient's family).  Yes   Patient/family informed of their freedom to choose among providers that offer the needed level of care, that participate in Medicare, Medicaid or managed care program needed by the patient, have an available bed and are willing to accept the patient.  Yes   Patient/family informed of [facility]'s ownership interest in [facility] and [facility], as well as of the fact that they are under no obligation to receive care at these facilities.  PASRR submitted to EDS on 07/22/16     PASRR number received on 07/22/16     Existing PASRR number confirmed on       FL2 transmitted to all facilities in geographic area requested by pt/family on 07/22/16     FL2 transmitted to all facilities within larger geographic area on       Patient informed that his/her managed care company has contracts with or will negotiate with certain facilities, including the following:        Yes   Patient/family informed of bed offers received.  Patient chooses bed at [facility]     Physician recommends and patient chooses bed at      Patient to be transferred to [facility] on 07/24/16.  Patient to be transferred to facility by [facility]     Patient family notified on 07/24/16 of transfer.  Name of family member notified:  Pt will update her family, who stepped out of the room     PHYSICIAN      Additional Comment:    _______________________________________________ Darden Dates, LCSW 07/24/2016, 2:17 PM

## 2016-07-24 NOTE — Progress Notes (Signed)
Physical Therapy Treatment Patient Details Name: See Beharry MRN: [id number] DOB: [birth year] Today's Date: 07/24/2016    History of Present Illness Pt admitted to the CCU 07/02/16 for acute respiratory failure with hypoxia on continuous BiPAP. Pt moved to med surg on 07/03/16 on 3L O2. Pt moved to CCU on 07/04/16 after RR called for acute on chronic hypercapnic respiratory failure requiring continuous BiPAP. Pt returned to med surg on 4/21, but moved back to CCU on 4/22 for increased respiratory distress with hypoxemia and back on BiPAP. Pt intubated on 07/12/16 and extubated on 07/18/16. New PT orders recieved on 07/18/16 for re-eval and pt currently on 4L O2. Pt has PMH of anxiety, arthritis, CHF, COPD, HTN, hypothyroidism, restless leg syndrome, sleep apnea, s/p back surgery (cervical), foot arthroplasty, L total shoulder arthroplasty, and TKR. Pt wears 3L of O2 PRN at baseline.     PT Comments    Pt in bed, ready for session.  To edge of bed with mod a x 1, HOB raised and increased time.  Some dizziness reported upon sitting but cleared with time.  Stood x 2 with mod a x 2.  Once standing, she fatigues quickly but was able to take 3 small sidesteps along the bed.  She wanted to try but was unable to stand a 3rd time.  Max a x 2 to return to supine and reposition.    Sats monitored throughout session.  3 lpm at rest high 80's.  During activity dropped to low 80's and was increased to 4 lpm to maintain sats.  Returned to 3 lpm at end of session.  Pt stated her baseline was high 80's/ mid 90's.    SNF remains appropriate at this time to return to baseline mobility and is motivated to do so.   Follow Up Recommendations  SNF     Equipment Recommendations       Recommendations for Other Services       Precautions / Restrictions Precautions Precautions: Fall Restrictions Weight Bearing Restrictions: No    Mobility  Bed Mobility Overal bed mobility: Needs Assistance Bed Mobility: Supine  to Sit;Sit to Supine     Supine to sit: Mod assist Sit to supine: Max assist;+2 for physical assistance      Transfers Overall transfer level: Needs assistance Equipment used: Rolling walker (2 wheeled) Transfers: Sit to/from Stand Sit to Stand: Mod assist;+2 physical assistance         General transfer comment: stood x 2, unable on 3rd attempt  Ambulation/Gait             General Gait Details: unsafe at this time   Stairs            Wheelchair Mobility    Modified Rankin (Stroke Patients Only)       Balance Overall balance assessment: Needs assistance Sitting-balance support: Feet supported;Single extremity supported Sitting balance-Leahy Scale: Good Sitting balance - Comments: pt required supervision when sitting EOB    Standing balance support: Bilateral upper extremity supported Standing balance-Leahy Scale: Fair                              Cognition Arousal/Alertness: Awake/alert Behavior During Therapy: WFL for tasks assessed/performed Overall Cognitive Status: Within Functional Limits for tasks assessed  Exercises Other Exercises Other Exercises: BLE ankle pumps, heel slides, SLR,     General Comments        Pertinent Vitals/Pain Pain Assessment: No/denies pain    Home Living                      Prior Function            PT Goals (current goals can now be found in the care plan section) Progress towards PT goals: Progressing toward goals    Frequency           PT Plan Current plan remains appropriate    Co-evaluation              AM-PAC PT "6 Clicks" Daily Activity  Outcome Measure  Difficulty turning over in bed (including adjusting bedclothes, sheets and blankets)?: Total Difficulty moving from lying on back to sitting on the side of the bed? : Total Difficulty sitting down on and standing up from a chair with arms (e.g.,  wheelchair, bedside commode, etc,.)?: Total Help needed moving to and from a bed to chair (including a wheelchair)?: Total Help needed walking in hospital room?: Total Help needed climbing 3-5 steps with a railing? : Total 6 Click Score: 6    End of Session Equipment Utilized During Treatment: Gait belt;Oxygen Activity Tolerance: Patient limited by fatigue Patient left: in bed;with bed alarm set;with call bell/phone within reach;with family/visitor present         Time: 1130-1150 PT Time Calculation (min) (ACUTE ONLY): 20 min  Charges:  $Therapeutic Activity: 8-22 mins                    G Codes:       Jo-Ann Johanning, PTA 07/24/16, 11:55 AM

## 2016-07-24 NOTE — Progress Notes (Signed)
Report called to [place], Therapist, sports of [place] [employer]. AVS placed in discharge packet. Patient and husband aware of discharge plan awaiting transport at this time.

## 2016-07-24 NOTE — Discharge Summary (Addendum)
[facility] [place] NAME: Hannah Powell    MR#:  [id number]  DATE OF BIRTH:  08-06-1945  DATE OF ADMISSION:  07/02/2016   ADMITTING PHYSICIAN: Loletha Grayer, MD  DATE OF DISCHARGE: 07/24/2016 PRIMARY CARE PHYSICIAN: Adin Hector, MD   ADMISSION DIAGNOSIS:  COPD exacerbation ([place]) [J44.1] Acute respiratory failure with hypoxia and hypercapnia ([place]) [J96.01, J96.02] Community acquired pneumonia of right lung, unspecified part of lung [J18.9] DISCHARGE DIAGNOSIS:  Active Problems:   Acute respiratory failure ([place])   Pressure injury of skin   Community acquired pneumonia of right lung   Palliative care by specialist   Goals of care, counseling/discussion   Pneumonia due to Streptococcus pneumoniae ([place])   Fever   Pulmonary infiltrates Acute on chronic hypoxic/hypercarbic respiratory failure with hypoxia and acidosis.  Severe sepsis with Right-sided pneumonia, leukocytosis and tachypnea. Acute kidney injury due to ATN  Acute on chronic COPD on chronic oxygen. SECONDARY DIAGNOSIS:   Past Medical History:  Diagnosis Date  . Anxiety   . Arthritis   . CHF (congestive heart failure) ([place])   . COPD (chronic obstructive pulmonary disease) ([place])   . Hypertension    dr Otho Najjar     . Hypothyroidism   . RLS (restless legs syndrome)   . Shortness of breath   . Sleep apnea    cpap      >5 yrs   HOSPITAL COURSE:   Hannah Powell a 71 y.o.femalepresented with respiratory distress. She stated on Thursday she started feeling bad. She needed to increase her oxygen from 2 L to 3 L. On Saturday she had shaking chills and fever.   1. Acute on chronic hypoxic/hypercarbic respiratory failure with hypoxia and acidosis.  -Appears combination of severe COPD and CHF acute diastolic (Echo in the past EF 65%) -Patient intubated on  (07/12/16)---now extubated 07/18/2016. On 3 L O2 -Repeat cultures including blood cultures sputum culture no  growth from 07/13/2016 -Status post thoracentesis 07/17/2016 - 500 cc of fluid -Patient resume back on Lasix 20 mg daily -She will be using BiPAP every night. Her settings have been given. Rehabilitation place has made arrangements for BiPAP.  2. Severe sepsis with Right-sided pneumonia, leukocytosis and tachypnea. IV Vanco and Zosyn were given. Patient currently not on any antibiotics - repeat blood cultures from 4/27 negative  - sputum cultures negative -was on IV solucortef (stress doses) - Stopped  3. Acute kidney injury due to ATN from pneumonia and sepsis. Resolved  4. Acute on chronic COPD on chronic oxygen. -management as above  5.History of obstructive sleep apnea   6.  History of Essential hypertension patient was hypotensive on IV levophed. -resumed losartan  7. Hypothyroidism -on synthroid    8. Palliative care on board -Patient remains DO NOT RESUSCITATE  DISCHARGE CONDITIONS:  Clinically stable, discharge to SNF today. CONSULTS OBTAINED:  Treatment Team:  Leonel Ramsay, MD DRUG ALLERGIES:   Allergies  Allergen Reactions  . Lyrica [Pregabalin] Other (See Comments)    Reaction: made Restless Leg Syndrome worse    DISCHARGE MEDICATIONS:   Allergies as of 07/24/2016      Reactions   Lyrica [pregabalin] Other (See Comments)   Reaction: made Restless Leg Syndrome worse       Medication List    STOP taking these medications   doxycycline 100 MG tablet Commonly known as:  VIBRA-TABS   guaiFENesin 600 MG 12 hr tablet Commonly known as:  MUCINEX   metolazone  5 MG tablet Commonly known as:  ZAROXOLYN   zolpidem 10 MG tablet Commonly known as:  AMBIEN     TAKE these medications   aspirin EC 81 MG tablet Take 81 mg by mouth daily.   B-complex with vitamin C tablet Take 1 tablet by mouth daily.   calcium-vitamin D 500-200 MG-UNIT tablet Take 1 tablet by mouth daily.   chlorpheniramine-HYDROcodone 10-8 MG/5ML Suer Commonly known  as:  TUSSIONEX PENNKINETIC ER Take 5 mLs by mouth 2 (two) times daily as needed.   fluticasone furoate-vilanterol 100-25 MCG/INH Aepb Commonly known as:  BREO ELLIPTA Inhale 1 puff into the lungs daily.   furosemide 20 MG tablet Commonly known as:  LASIX Take 1 tablet (20 mg total) by mouth daily. What changed:  medication strength  how much to take   guaiFENesin-codeine 100-10 MG/5ML syrup Take 10 mLs by mouth every 4 (four) hours as needed for cough.   ipratropium-albuterol 0.5-2.5 (3) MG/3ML Soln Commonly known as:  DUONEB Take 3 mLs by nebulization every 4 (four) hours. What changed:  when to take this  reasons to take this   levothyroxine 200 MCG tablet Commonly known as:  SYNTHROID, LEVOTHROID Take 200 mcg by mouth daily before breakfast.   losartan 25 MG tablet Commonly known as:  COZAAR Take 25 mg by mouth daily.   neomycin-polymyxin b-dexamethasone 3.5-10000-0.1 Susp Commonly known as:  MAXITROL Place 1 drop into the left eye every 6 (six) hours.   nitrofurantoin (macrocrystal-monohydrate) 100 MG capsule Commonly known as:  MACROBID Take 1 capsule (100 mg total) by mouth 2 (two) times daily.   OMEGA 3 PO Take 1 capsule by mouth daily.   phenazopyridine 200 MG tablet Commonly known as:  PYRIDIUM Take 1 tablet (200 mg total) by mouth 3 (three) times daily as needed for pain.   pramipexole 1 MG tablet Commonly known as:  MIRAPEX Take 4 mg by mouth at bedtime.   predniSONE 20 MG tablet Commonly known as:  DELTASONE Take 2 tablets (40 mg total) by mouth daily with breakfast.   roflumilast 500 MCG Tabs tablet Commonly known as:  DALIRESP Take 1 tablet (500 mcg total) by mouth daily.   traZODone 100 MG tablet Commonly known as:  DESYREL Take 1 tablet (100 mg total) by mouth at bedtime.        DISCHARGE INSTRUCTIONS:  See AVS   If you experience worsening of your admission symptoms, develop shortness of breath, life threatening emergency,  suicidal or homicidal thoughts you must seek medical attention immediately by calling 911 or calling your MD immediately  if symptoms less severe.  You Must read complete instructions/literature along with all the possible adverse reactions/side effects for all the Medicines you take and that have been prescribed to you. Take any new Medicines after you have completely understood and accpet all the possible adverse reactions/side effects.   Please note  You were cared for by a hospitalist during your hospital stay. If you have any questions about your discharge medications or the care you received while you were in the hospital after you are discharged, you can call the unit and asked to speak with the hospitalist on call if the hospitalist that took care of you is not available. Once you are discharged, your primary care physician will handle any further medical issues. Please note that NO REFILLS for any discharge medications will be authorized once you are discharged, as it is imperative that you return to your primary care physician (or  establish a relationship with a primary care physician if you do not have one) for your aftercare needs so that they can reassess your need for medications and monitor your lab values.    On the day of Discharge:  VITAL SIGNS:  Blood pressure 131/88, pulse 94, temperature 97.8 F (36.6 C), temperature source Oral, resp. rate (!) 22, height 5\' 5"  (1.651 m), weight 252 lb (114.3 kg), SpO2 94 %. PHYSICAL EXAMINATION:  GENERAL:  71 y.o.-year-old patient lying in the bed with no acute distress. Obese. EYES: Pupils equal, round, reactive to light and accommodation. No scleral icterus. Extraocular muscles intact. HEENT: Head atraumatic, normocephalic. Oropharynx and nasopharynx clear.  NECK:  Supple, no jugular venous distention. No thyroid enlargement, no tenderness.  LUNGS: Normal breath sounds bilaterally, no wheezing, rales,rhonchi or crepitation. No use of  accessory muscles of respiration.  CARDIOVASCULAR: S1, S2 normal. No murmurs, rubs, or gallops.  ABDOMEN: Soft, non-tender, non-distended. Bowel sounds present. No organomegaly or mass.  EXTREMITIES: No pedal edema, cyanosis, or clubbing.  NEUROLOGIC: Cranial nerves II through XII are intact. Muscle strength 5/5 in all extremities. Sensation intact. Gait not checked.  PSYCHIATRIC: The patient is alert and oriented x 3.  SKIN: No obvious rash, lesion, or ulcer.  DATA REVIEW:   CBC  Recent Labs Lab 07/21/16 0428  WBC 8.7  HGB 11.5*  HCT 35.2  PLT 370    Chemistries   Recent Labs Lab 07/19/16 0522  07/21/16 0428  NA 146*  < > 138  K 2.8*  < > 3.8  CL 92*  < > 93*  CO2 50*  < > 42*  GLUCOSE 97  < > 90  BUN 27*  < > 18  CREATININE 0.50  < > 0.35*  CALCIUM 8.4*  < > 8.4*  MG 1.9  --   --   < > = values in this interval not displayed.   Microbiology Results  Results for orders placed or performed during the hospital encounter of 07/02/16  Blood culture (routine x 2)     Status: Abnormal   Collection Time: 07/02/16 11:47 AM  Result Value Ref Range Status   Specimen Description BLOOD LEFT ARM  Final   Special Requests   Final    BOTTLES DRAWN AEROBIC AND ANAEROBIC Blood Culture results may not be optimal due to an excessive volume of blood received in culture bottles   Culture  Setup Time   Final    GRAM POSITIVE COCCI IN CLUSTERS ANAEROBIC BOTTLE ONLY CRITICAL VALUE NOTED.  VALUE IS CONSISTENT WITH PREVIOUSLY REPORTED AND CALLED VALUE. Performed at [facility], [place] [address], [place], [place] [address]    Culture STAPHYLOCOCCUS CAPITIS (A)  Final   Report Status 07/07/2016 FINAL  Final   Organism ID, Bacteria STAPHYLOCOCCUS CAPITIS  Final      Susceptibility   Staphylococcus capitis - MIC*    CIPROFLOXACIN 4 RESISTANT Resistant     ERYTHROMYCIN >=8 RESISTANT Resistant     GENTAMICIN <=0.5 SENSITIVE Sensitive     OXACILLIN >=4 RESISTANT Resistant      TETRACYCLINE <=1 SENSITIVE Sensitive     VANCOMYCIN <=0.5 SENSITIVE Sensitive     TRIMETH/SULFA <=10 SENSITIVE Sensitive     CLINDAMYCIN INTERMEDIATE Intermediate     RIFAMPIN <=0.5 SENSITIVE Sensitive     Inducible Clindamycin NEGATIVE Sensitive     * STAPHYLOCOCCUS CAPITIS  Blood culture (routine x 2)     Status: Abnormal   Collection Time: 07/02/16 11:47 AM  Result Value Ref Range Status   Specimen Description BLOOD RIGHT ARM  Final   Special Requests   Final    BOTTLES DRAWN AEROBIC AND ANAEROBIC Blood Culture adequate volume   Culture  Setup Time   Final    GRAM POSITIVE COCCI IN BOTH AEROBIC AND ANAEROBIC BOTTLES CRITICAL RESULT CALLED TO, READ BACK BY AND VERIFIED WITH: SHEEMA HALLAJI 07/03/16 @ 57  [place]    Culture (A)  Final    STAPHYLOCOCCUS CAPITIS SUSCEPTIBILITIES PERFORMED ON PREVIOUS CULTURE WITHIN THE LAST 5 DAYS. Performed at [facility], [place] [address] [place], [place] [address]    Report Status 07/06/2016 FINAL  Final  Blood Culture ID Panel (Reflexed)     Status: Abnormal   Collection Time: 07/02/16 11:47 AM  Result Value Ref Range Status   Enterococcus species NOT DETECTED NOT DETECTED Final   Listeria monocytogenes NOT DETECTED NOT DETECTED Final   Staphylococcus species DETECTED (A) NOT DETECTED Final    Comment: Methicillin (oxacillin) resistant coagulase negative staphylococcus. Possible blood culture contaminant (unless isolated from more than one blood culture draw or clinical case suggests pathogenicity). No antibiotic treatment is indicated for blood  culture contaminants. CRITICAL RESULT CALLED TO, READ BACK BY AND VERIFIED WITH: SHEEMA HALLAJI 07/03/16 @ 8756  [place]    Staphylococcus aureus NOT DETECTED NOT DETECTED Final   Methicillin resistance DETECTED (A) NOT DETECTED Final    Comment: CRITICAL RESULT CALLED TO, READ BACK BY AND VERIFIED WITH: SHEEMA HALLAJI 07/03/16 @ 1725  [facility]    Streptococcus species NOT DETECTED NOT DETECTED Final    Streptococcus agalactiae NOT DETECTED NOT DETECTED Final   Streptococcus pneumoniae NOT DETECTED NOT DETECTED Final   Streptococcus pyogenes NOT DETECTED NOT DETECTED Final   Acinetobacter baumannii NOT DETECTED NOT DETECTED Final   Enterobacteriaceae species NOT DETECTED NOT DETECTED Final   Enterobacter cloacae complex NOT DETECTED NOT DETECTED Final   Escherichia coli NOT DETECTED NOT DETECTED Final   Klebsiella oxytoca NOT DETECTED NOT DETECTED Final   Klebsiella pneumoniae NOT DETECTED NOT DETECTED Final   Proteus species NOT DETECTED NOT DETECTED Final   Serratia marcescens NOT DETECTED NOT DETECTED Final   Haemophilus influenzae NOT DETECTED NOT DETECTED Final   Neisseria meningitidis NOT DETECTED NOT DETECTED Final   Pseudomonas aeruginosa NOT DETECTED NOT DETECTED Final   Candida albicans NOT DETECTED NOT DETECTED Final   Candida glabrata NOT DETECTED NOT DETECTED Final   Candida krusei NOT DETECTED NOT DETECTED Final   Candida parapsilosis NOT DETECTED NOT DETECTED Final   Candida tropicalis NOT DETECTED NOT DETECTED Final  MRSA PCR Screening     Status: None   Collection Time: 07/02/16  3:14 PM  Result Value Ref Range Status   MRSA by PCR NEGATIVE NEGATIVE Final    Comment:        The GeneXpert MRSA Assay (FDA approved for NASAL specimens only), is one component of a comprehensive MRSA colonization surveillance program. It is not intended to diagnose MRSA infection nor to guide or monitor treatment for MRSA infections.   Culture, expectorated sputum-assessment     Status: None   Collection Time: 07/03/16 12:23 AM  Result Value Ref Range Status   Specimen Description SPUTUM  Final   Special Requests NONE  Final   Sputum evaluation THIS SPECIMEN IS ACCEPTABLE FOR SPUTUM CULTURE  Final   Report Status 07/03/2016 FINAL  Final  Culture, respiratory (NON-Expectorated)     Status: None   Collection Time: 07/03/16 12:23  AM  Result Value Ref Range Status   Specimen  Description SPUTUM  Final   Special Requests NONE Reflexed from [id number]  Final   Gram Stain   Final    MODERATE WBC PRESENT, PREDOMINANTLY PMN RARE YEAST FEW GRAM POSITIVE COCCI IN PAIRS Performed at [facility], [address], [place], [place] [address]    Culture FEW STREPTOCOCCUS PNEUMONIAE  Final   Report Status 07/05/2016 FINAL  Final   Organism ID, Bacteria STREPTOCOCCUS PNEUMONIAE  Final      Susceptibility   Streptococcus pneumoniae - MIC*    ERYTHROMYCIN >=8 RESISTANT Resistant     LEVOFLOXACIN >=16 RESISTANT Resistant     PENICILLIN (meningitis) <=0.06 SENSITIVE Sensitive     PENICILLIN (non-meningitis) <=0.06 SENSITIVE Sensitive     PENICILLIN (oral) <=0.06 SENSITIVE Sensitive     CEFTRIAXONE (non-meningitis) <=0.12 SENSITIVE Sensitive     CEFTRIAXONE (meningitis) <=0.12 SENSITIVE Sensitive     * FEW STREPTOCOCCUS PNEUMONIAE  Culture, blood (single) w Reflex to ID Panel     Status: None   Collection Time: 07/05/16 11:23 AM  Result Value Ref Range Status   Specimen Description BLOOD R HAND  Final   Special Requests   Final    BOTTLES DRAWN AEROBIC AND ANAEROBIC Blood Culture adequate volume   Culture NO GROWTH 5 DAYS  Final   Report Status 07/10/2016 FINAL  Final  Urine culture     Status: None   Collection Time: 07/13/16 12:16 AM  Result Value Ref Range Status   Specimen Description URINE, RANDOM  Final   Special Requests NONE  Final   Culture   Final    NO GROWTH Performed at [facility], [place] [address] [place], [place] [address]    Report Status 07/14/2016 FINAL  Final  Culture, blood (Routine X 2) w Reflex to ID Panel     Status: None   Collection Time: 07/13/16 12:47 AM  Result Value Ref Range Status   Specimen Description BLOOD RIGHT HAND  Final   Special Requests   Final    BOTTLES DRAWN AEROBIC AND ANAEROBIC Blood Culture adequate volume   Culture NO GROWTH 5 DAYS  Final   Report Status 07/18/2016 FINAL  Final  Culture, blood (Routine X  2) w Reflex to ID Panel     Status: None   Collection Time: 07/13/16 12:47 AM  Result Value Ref Range Status   Specimen Description BLOOD LEFT HAND  Final   Special Requests   Final    BOTTLES DRAWN AEROBIC AND ANAEROBIC Blood Culture adequate volume   Culture NO GROWTH 5 DAYS  Final   Report Status 07/18/2016 FINAL  Final  Culture, respiratory (NON-Expectorated)     Status: None   Collection Time: 07/13/16  5:15 PM  Result Value Ref Range Status   Specimen Description TRACHEAL ASPIRATE  Final   Special Requests NONE  Final   Gram Stain   Final    ABUNDANT WBC PRESENT,BOTH PMN AND MONONUCLEAR NO ORGANISMS SEEN Performed at [facility], [place] [address], [place], [place] [address]    Culture MODERATE CANDIDA ALBICANS  Final   Report Status 07/16/2016 FINAL  Final  Body fluid culture     Status: None   Collection Time: 07/17/16  2:24 PM  Result Value Ref Range Status   Specimen Description PLEURAL  Final   Special Requests NONE  Final   Gram Stain   Final    RARE WBC PRESENT,  PREDOMINANTLY MONONUCLEAR NO ORGANISMS SEEN    Culture   Final    NO GROWTH 3 DAYS Performed at [facility], [place] [address] [place], [place] [address]    Report Status 07/20/2016 FINAL  Final    RADIOLOGY:  No results found.   Management plans discussed with the patient, family and they are in agreement.  CODE STATUS: DNR   TOTAL TIME TAKING CARE OF THIS PATIENT: 36 minutes.    Demetrios Loll M.D on 07/24/2016 at 8:38 AM  Between 7am to 6pm - Pager - [phone]  After 6pm go to [email or web address] - password [email or web address] [facility]  [employer] [facility]  Office  [phone]  CC: Primary care physician; Adin Hector, MD   Note: This dictation was prepared with Dragon dictation along with smaller phrase technology. Any transcriptional errors that result from this process are unintentional.

## 2016-07-24 NOTE — Discharge Instructions (Signed)
Continue home O2 [place] 3 L, BIPAP at night Continue prednisone 40 mg po daily until seeing Dr. Mortimer Fries. Heart healthy diet. Fall precaution.

## 2016-07-26 DIAGNOSIS — I5032 Chronic diastolic (congestive) heart failure: Secondary | ICD-10-CM | POA: Diagnosis not present

## 2016-07-26 DIAGNOSIS — J441 Chronic obstructive pulmonary disease with (acute) exacerbation: Secondary | ICD-10-CM | POA: Diagnosis not present

## 2016-07-26 DIAGNOSIS — G4700 Insomnia, unspecified: Secondary | ICD-10-CM | POA: Diagnosis not present

## 2016-07-26 DIAGNOSIS — J9600 Acute respiratory failure, unspecified whether with hypoxia or hypercapnia: Secondary | ICD-10-CM | POA: Diagnosis not present

## 2016-07-26 DIAGNOSIS — G2581 Restless legs syndrome: Secondary | ICD-10-CM | POA: Diagnosis not present

## 2016-07-26 DIAGNOSIS — E039 Hypothyroidism, unspecified: Secondary | ICD-10-CM | POA: Diagnosis not present

## 2016-07-26 DIAGNOSIS — J189 Pneumonia, unspecified organism: Secondary | ICD-10-CM | POA: Diagnosis not present

## 2016-07-26 DIAGNOSIS — N390 Urinary tract infection, site not specified: Secondary | ICD-10-CM | POA: Diagnosis not present

## 2016-07-26 DIAGNOSIS — M6281 Muscle weakness (generalized): Secondary | ICD-10-CM | POA: Diagnosis not present

## 2016-07-27 ENCOUNTER — Other Ambulatory Visit
Admission: RE | Admit: 2016-07-27 | Discharge: 2016-07-27 | Disposition: A | Discharge status: Home / Self Care | Payer: Medicare HMO | Source: Ambulatory Visit | Attending: Family Medicine | Admitting: Family Medicine

## 2016-07-27 DIAGNOSIS — I2510 Atherosclerotic heart disease of native coronary artery without angina pectoris: Secondary | ICD-10-CM | POA: Insufficient documentation

## 2016-07-27 LAB — BRAIN NATRIURETIC PEPTIDE: B Natriuretic Peptide: 96 pg/mL (ref 0.0–100.0)

## 2016-08-01 ENCOUNTER — Emergency Department: Payer: Medicare HMO

## 2016-08-01 ENCOUNTER — Inpatient Hospital Stay: Payer: Medicare HMO

## 2016-08-01 ENCOUNTER — Inpatient Hospital Stay
Admission: EM | Admit: 2016-08-01 | Discharge: 2016-08-06 | DRG: 291 | Disposition: A | Discharge status: Skilled Nursing Facility | Payer: Medicare HMO | Attending: Internal Medicine | Admitting: Internal Medicine

## 2016-08-01 DIAGNOSIS — J9621 Acute and chronic respiratory failure with hypoxia: Secondary | ICD-10-CM | POA: Diagnosis not present

## 2016-08-01 DIAGNOSIS — Z66 Do not resuscitate: Secondary | ICD-10-CM | POA: Diagnosis present

## 2016-08-01 DIAGNOSIS — J9622 Acute and chronic respiratory failure with hypercapnia: Secondary | ICD-10-CM | POA: Diagnosis present

## 2016-08-01 DIAGNOSIS — E039 Hypothyroidism, unspecified: Secondary | ICD-10-CM | POA: Diagnosis not present

## 2016-08-01 DIAGNOSIS — I5033 Acute on chronic diastolic (congestive) heart failure: Secondary | ICD-10-CM | POA: Diagnosis present

## 2016-08-01 DIAGNOSIS — J441 Chronic obstructive pulmonary disease with (acute) exacerbation: Secondary | ICD-10-CM | POA: Diagnosis not present

## 2016-08-01 DIAGNOSIS — R0902 Hypoxemia: Secondary | ICD-10-CM

## 2016-08-01 DIAGNOSIS — E876 Hypokalemia: Secondary | ICD-10-CM | POA: Diagnosis not present

## 2016-08-01 DIAGNOSIS — I110 Hypertensive heart disease with heart failure: Secondary | ICD-10-CM | POA: Diagnosis not present

## 2016-08-01 DIAGNOSIS — Z7982 Long term (current) use of aspirin: Secondary | ICD-10-CM

## 2016-08-01 DIAGNOSIS — G4733 Obstructive sleep apnea (adult) (pediatric): Secondary | ICD-10-CM | POA: Diagnosis present

## 2016-08-01 DIAGNOSIS — Z8249 Family history of ischemic heart disease and other diseases of the circulatory system: Secondary | ICD-10-CM | POA: Diagnosis not present

## 2016-08-01 DIAGNOSIS — G931 Anoxic brain damage, not elsewhere classified: Secondary | ICD-10-CM | POA: Diagnosis not present

## 2016-08-01 DIAGNOSIS — I509 Heart failure, unspecified: Secondary | ICD-10-CM | POA: Diagnosis not present

## 2016-08-01 DIAGNOSIS — I5031 Acute diastolic (congestive) heart failure: Secondary | ICD-10-CM | POA: Diagnosis not present

## 2016-08-01 DIAGNOSIS — I700 Atherosclerosis of aorta: Secondary | ICD-10-CM | POA: Diagnosis present

## 2016-08-01 DIAGNOSIS — J9601 Acute respiratory failure with hypoxia: Secondary | ICD-10-CM | POA: Diagnosis not present

## 2016-08-01 DIAGNOSIS — Z79899 Other long term (current) drug therapy: Secondary | ICD-10-CM | POA: Diagnosis not present

## 2016-08-01 DIAGNOSIS — Z888 Allergy status to other drugs, medicaments and biological substances status: Secondary | ICD-10-CM

## 2016-08-01 DIAGNOSIS — J9610 Chronic respiratory failure, unspecified whether with hypoxia or hypercapnia: Secondary | ICD-10-CM | POA: Diagnosis not present

## 2016-08-01 DIAGNOSIS — J9811 Atelectasis: Secondary | ICD-10-CM | POA: Diagnosis not present

## 2016-08-01 DIAGNOSIS — J690 Pneumonitis due to inhalation of food and vomit: Secondary | ICD-10-CM | POA: Diagnosis not present

## 2016-08-01 DIAGNOSIS — R4182 Altered mental status, unspecified: Secondary | ICD-10-CM

## 2016-08-01 DIAGNOSIS — G2581 Restless legs syndrome: Secondary | ICD-10-CM | POA: Diagnosis not present

## 2016-08-01 DIAGNOSIS — J9690 Respiratory failure, unspecified, unspecified whether with hypoxia or hypercapnia: Secondary | ICD-10-CM | POA: Diagnosis present

## 2016-08-01 DIAGNOSIS — Z96612 Presence of left artificial shoulder joint: Secondary | ICD-10-CM | POA: Diagnosis present

## 2016-08-01 DIAGNOSIS — J449 Chronic obstructive pulmonary disease, unspecified: Secondary | ICD-10-CM | POA: Diagnosis not present

## 2016-08-01 DIAGNOSIS — R0689 Other abnormalities of breathing: Secondary | ICD-10-CM

## 2016-08-01 DIAGNOSIS — Z87891 Personal history of nicotine dependence: Secondary | ICD-10-CM | POA: Diagnosis not present

## 2016-08-01 DIAGNOSIS — J9600 Acute respiratory failure, unspecified whether with hypoxia or hypercapnia: Secondary | ICD-10-CM | POA: Diagnosis not present

## 2016-08-01 DIAGNOSIS — F419 Anxiety disorder, unspecified: Secondary | ICD-10-CM | POA: Diagnosis present

## 2016-08-01 DIAGNOSIS — M6281 Muscle weakness (generalized): Secondary | ICD-10-CM | POA: Diagnosis not present

## 2016-08-01 DIAGNOSIS — I10 Essential (primary) hypertension: Secondary | ICD-10-CM | POA: Diagnosis not present

## 2016-08-01 DIAGNOSIS — E870 Hyperosmolality and hypernatremia: Secondary | ICD-10-CM | POA: Diagnosis present

## 2016-08-01 DIAGNOSIS — J810 Acute pulmonary edema: Secondary | ICD-10-CM | POA: Diagnosis not present

## 2016-08-01 DIAGNOSIS — I2510 Atherosclerotic heart disease of native coronary artery without angina pectoris: Secondary | ICD-10-CM | POA: Diagnosis not present

## 2016-08-01 DIAGNOSIS — Z7401 Bed confinement status: Secondary | ICD-10-CM | POA: Diagnosis not present

## 2016-08-01 DIAGNOSIS — J90 Pleural effusion, not elsewhere classified: Secondary | ICD-10-CM | POA: Diagnosis not present

## 2016-08-01 LAB — TROPONIN I: Troponin I: <0.03 ng/mL (ref <0.03)

## 2016-08-01 LAB — COMPREHENSIVE METABOLIC PANEL
ALK PHOS: 74 U/L (ref 38–126)
ALT: 18 U/L (ref 14–54)
AST: 27 U/L (ref 15–41)
Albumin: 3.3 g/dL — ABNORMAL LOW (ref 3.5–5.0)
Anion gap: 7 (ref 5–15)
BILIRUBIN TOTAL: 0.8 mg/dL (ref 0.3–1.2)
BUN: 13 mg/dL (ref 6–20)
CALCIUM: 8.8 mg/dL — AB (ref 8.9–10.3)
CO2: 48 mmol/L — ABNORMAL HIGH (ref 22–32)
CREATININE: 0.62 mg/dL (ref 0.44–1.00)
Chloride: 91 mmol/L — ABNORMAL LOW (ref 101–111)
Glucose, Bld: 113 mg/dL — ABNORMAL HIGH (ref 65–99)
Potassium: 3.5 mmol/L (ref 3.5–5.1)
Sodium: 146 mmol/L — ABNORMAL HIGH (ref 135–145)
Total Protein: 6.3 g/dL — ABNORMAL LOW (ref 6.5–8.1)

## 2016-08-01 LAB — BLOOD GAS, VENOUS
Acid-Base Excess: 23.8 mmol/L — ABNORMAL HIGH (ref 0.0–2.0)
Bicarbonate: 54.6 mmol/L — ABNORMAL HIGH (ref 20.0–28.0)
DELIVERY SYSTEMS: POSITIVE
FIO2: 0.5
O2 Saturation: 67.1 %
PCO2 VEN: 106 mmHg — AB (ref 44.0–60.0)
PO2 VEN: 38 mmHg (ref 32.0–45.0)
Patient temperature: 37
pH, Ven: 7.32 (ref 7.250–7.430)

## 2016-08-01 LAB — PROCALCITONIN

## 2016-08-01 LAB — URINALYSIS, COMPLETE (UACMP) WITH MICROSCOPIC
BILIRUBIN URINE: NEGATIVE
Glucose, UA: NEGATIVE mg/dL
HGB URINE DIPSTICK: NEGATIVE
Ketones, ur: NEGATIVE mg/dL
LEUKOCYTES UA: NEGATIVE
Nitrite: NEGATIVE
PROTEIN: 30 mg/dL — AB
SPECIFIC GRAVITY, URINE: 1.015 (ref 1.005–1.030)
pH: 6 (ref 5.0–8.0)

## 2016-08-01 LAB — CBC
HCT: 38.6 % (ref 35.0–47.0)
Hemoglobin: 12.4 g/dL (ref 12.0–16.0)
MCH: 31.8 pg (ref 26.0–34.0)
MCHC: 32.2 g/dL (ref 32.0–36.0)
MCV: 98.8 fL (ref 80.0–100.0)
Platelets: 143 10*3/uL — ABNORMAL LOW (ref 150–440)
RBC: 3.9 MIL/uL (ref 3.80–5.20)
RDW: 18.2 % — ABNORMAL HIGH (ref 11.5–14.5)
WBC: 5 10*3/uL (ref 3.6–11.0)

## 2016-08-01 LAB — POTASSIUM: POTASSIUM: 3 mmol/L — AB (ref 3.5–5.1)

## 2016-08-01 LAB — GLUCOSE, CAPILLARY: Glucose-Capillary: 95 mg/dL (ref 65–99)

## 2016-08-01 LAB — TSH: TSH: 0.668 u[IU]/mL (ref 0.350–4.500)

## 2016-08-01 LAB — AMMONIA: AMMONIA: 50 umol/L — AB (ref 9–35)

## 2016-08-01 LAB — MRSA PCR SCREENING: MRSA BY PCR: NEGATIVE

## 2016-08-01 LAB — BRAIN NATRIURETIC PEPTIDE: B NATRIURETIC PEPTIDE 5: 202 pg/mL — AB (ref 0.0–100.0)

## 2016-08-01 MED ORDER — ORAL CARE MOUTH RINSE
15.0000 mL | Freq: Two times a day (BID) | OROMUCOSAL | Status: DC
  Administered 2016-08-01 – 2016-08-02 (×4): 15 mL via OROMUCOSAL

## 2016-08-01 MED ORDER — CHLORHEXIDINE GLUCONATE 0.12 % MT SOLN
15.0000 mL | Freq: Two times a day (BID) | OROMUCOSAL | Status: DC
  Administered 2016-08-01 – 2016-08-05 (×6): 15 mL via OROMUCOSAL
  Filled 2016-08-01 (×5): qty 15

## 2016-08-01 MED ORDER — BISACODYL 5 MG PO TBEC: 5.0000 mg | DELAYED_RELEASE_TABLET | Freq: Every day | ORAL | Status: DC | PRN

## 2016-08-01 MED ORDER — LEVOTHYROXINE SODIUM 100 MCG IV SOLR
100.0000 ug | Freq: Every day | INTRAVENOUS | Status: DC
  Administered 2016-08-01: 100 ug via INTRAVENOUS
  Filled 2016-08-01: qty 5

## 2016-08-01 MED ORDER — SENNOSIDES-DOCUSATE SODIUM 8.6-50 MG PO TABS: 1.0000 | ORAL_TABLET | Freq: Every evening | ORAL | Status: DC | PRN

## 2016-08-01 MED ORDER — PIPERACILLIN-TAZOBACTAM 3.375 G IVPB
3.3750 g | Freq: Three times a day (TID) | INTRAVENOUS | Status: DC
  Administered 2016-08-01 – 2016-08-03 (×6): 3.375 g via INTRAVENOUS
  Filled 2016-08-01 (×9): qty 50

## 2016-08-01 MED ORDER — ACETAMINOPHEN 325 MG PO TABS: 650.0000 mg | ORAL_TABLET | Freq: Four times a day (QID) | ORAL | Status: DC | PRN

## 2016-08-01 MED ORDER — METHYLPREDNISOLONE SODIUM SUCC 40 MG IJ SOLR
40.0000 mg | Freq: Three times a day (TID) | INTRAMUSCULAR | Status: DC
  Administered 2016-08-01 – 2016-08-03 (×6): 40 mg via INTRAVENOUS
  Filled 2016-08-01 (×6): qty 1

## 2016-08-01 MED ORDER — ONDANSETRON HCL 4 MG PO TABS: 4.0000 mg | ORAL_TABLET | Freq: Four times a day (QID) | ORAL | Status: DC | PRN

## 2016-08-01 MED ORDER — FUROSEMIDE 10 MG/ML IJ SOLN
40.0000 mg | Freq: Two times a day (BID) | INTRAMUSCULAR | Status: DC
  Administered 2016-08-01 – 2016-08-03 (×4): 40 mg via INTRAVENOUS
  Filled 2016-08-01 (×5): qty 4

## 2016-08-01 MED ORDER — IOPAMIDOL (ISOVUE-370) INJECTION 76%
75.0000 mL | Freq: Once | INTRAVENOUS | Status: AC | PRN
  Administered 2016-08-01: 75 mL via INTRAVENOUS

## 2016-08-01 MED ORDER — FAMOTIDINE IN NACL 20-0.9 MG/50ML-% IV SOLN: 20.0000 mg | INTRAVENOUS | Status: DC

## 2016-08-01 MED ORDER — ENOXAPARIN SODIUM 40 MG/0.4ML ~~LOC~~ SOLN
40.0000 mg | Freq: Two times a day (BID) | SUBCUTANEOUS | Status: DC
  Administered 2016-08-01 – 2016-08-05 (×10): 40 mg via SUBCUTANEOUS
  Filled 2016-08-01 (×11): qty 0.4

## 2016-08-01 MED ORDER — HYDROCODONE-ACETAMINOPHEN 5-325 MG PO TABS: 1.0000 | ORAL_TABLET | ORAL | Status: DC | PRN

## 2016-08-01 MED ORDER — ACETAMINOPHEN 650 MG RE SUPP: 650.0000 mg | Freq: Four times a day (QID) | RECTAL | Status: DC | PRN

## 2016-08-01 MED ORDER — BUDESONIDE 0.5 MG/2ML IN SUSP
0.5000 mg | Freq: Two times a day (BID) | RESPIRATORY_TRACT | Status: DC
  Administered 2016-08-01 – 2016-08-06 (×11): 0.5 mg via RESPIRATORY_TRACT
  Filled 2016-08-01 (×11): qty 2

## 2016-08-01 MED ORDER — HYDRALAZINE HCL 20 MG/ML IJ SOLN: 10.0000 mg | Freq: Four times a day (QID) | INTRAMUSCULAR | Status: DC | PRN

## 2016-08-01 MED ORDER — ONDANSETRON HCL 4 MG/2ML IJ SOLN: 4.0000 mg | Freq: Four times a day (QID) | INTRAMUSCULAR | Status: DC | PRN

## 2016-08-01 MED ORDER — POTASSIUM CHLORIDE 10 MEQ/100ML IV SOLN
10.0000 meq | INTRAVENOUS | Status: AC
  Administered 2016-08-01 – 2016-08-02 (×6): 10 meq via INTRAVENOUS
  Filled 2016-08-01 (×6): qty 100

## 2016-08-01 MED ORDER — IPRATROPIUM-ALBUTEROL 0.5-2.5 (3) MG/3ML IN SOLN
3.0000 mL | RESPIRATORY_TRACT | Status: DC
  Administered 2016-08-01 – 2016-08-05 (×24): 3 mL via RESPIRATORY_TRACT
  Filled 2016-08-01 (×26): qty 3

## 2016-08-01 MED ORDER — FUROSEMIDE 10 MG/ML IJ SOLN: 40.0000 mg | Freq: Two times a day (BID) | INTRAMUSCULAR | Status: DC

## 2016-08-01 MED ORDER — ENOXAPARIN SODIUM 40 MG/0.4ML ~~LOC~~ SOLN: 40.0000 mg | SUBCUTANEOUS | Status: DC

## 2016-08-01 NOTE — Progress Notes (Signed)
eLink Physician-Brief Progress Note Patient Name: Hannah Powell DOB: 10/21/1945 MRN: [id number]   Date of Service  08/01/2016  HPI/Events of Note  Consult for hypercapnic respiratory failure. Patient known to have diastolic heart failure, recently with acute on chronic dyspnea.  VBG on admission pCo2 120. Placed on Bipap.  Per hospitalist, patient is still lethargic.  Stat ABG. If not better, patient is to be intubated.   Told [place] re: pt. PCCM to see pt.   eICU Interventions       Intervention Category Evaluation Type: Other  [place] 08/01/2016, 6:54 AM

## 2016-08-01 NOTE — ED Notes (Signed)
Report from [place], [place]. Care assumed by this RN.

## 2016-08-01 NOTE — ED Notes (Signed)
Pt arrives to ED via [facility] from [facility] with c/o respiratory distress and AMS. Per EMS pt was found to be altered with cyanotic lips sometime at the beginning of 3rd shift (11pm) and noted O2 sats to be in the 60's. Facility staff placed pt on 15L NRB and O2 sats imporved to mid 90's. EMS states the facility reports pt had "one or more episodes of emesis at an unknown time today" and ?'s if the pt might have possibly aspirated the vomitus. Facility reports the pt has been lethargic and sleeping all day. Pt arrives on 15L NRB via EMS, responds to voice and able to follow commands; denies any c/o pain. Dr Dahlia Client at beside upon pt's arrival to ED. EMS reports CBG checked and found to be 105 mg/dL en route.

## 2016-08-01 NOTE — ED Notes (Signed)
ED Provider at bedside. 

## 2016-08-01 NOTE — Consult Note (Addendum)
Name: Hannah Powell MRN: [id number] DOB: [birth year]    ADMISSION DATE:  08/01/2016 CONSULTATION DATE: 08/01/2016  REFERRING MD : Dr. Benjie Karvonen  CHIEF COMPLAINT:  Altered mental status  BRIEF PATIENT DESCRIPTION:  71 yo female admitted 05/16 with altered mental status and acute on chronic hypoxic hypercapnic respiratory failure secondary to AECOPD and CHF exacerbation requiring continuous Bipap  SIGNIFICANT EVENTS  05/16-Pt admitted to ICU   STUDIES:  CT Head 05/16>>Age related atrophy with minimal chronic small vessel ischemia. No acute intracranial abnormality. Fluid levels in the sphenoid sinuses and left maxillary sinus, may be secondary to recent intubation or sinusitis  HISTORY OF PRESENT ILLNESS:   This is a 71 yo female with a PMH of OSA (CPAP qhs), Restless Leg Syndrome, Hypothyroidism, COPD, HTN, Chronic Diastolic CHF, Arthritis, and Anxiety.  She presented to [facility] ER 05/16 with altered mental status, cyanotic lips, and hypoxia O2 sats 60's.  Per ER notes the facility reported the pt had been lethargic through the day and symptoms progressively worsened at the beginning of third shift 05/15.  However, according to pts son the pt had a good day yesterday 05/15 and was very active during rehabilitation.  She was recently discharged the beginning of May following a long hospital course due to pneumonia and acute on chronic hypoxic hypercapnic respiratory failure requiring mechanical intubation.  The pt arrived in the ER this presentation via EMS on a nonrebreather mask and was immediately placed on Bipap.  Lab results revealed venous gas pH 7.22, PCO2 120, and PO2 49.  She was subsequently admitted to the ICU by hospitalist for further workup and treatment.   Pt admitted to the CCU 07/02/16 for acute respiratory failure with hypoxia on continuous BiPAP. Pt moved to med surg on 07/03/16 on 3L O2. Pt moved to CCU on 07/04/16 after RR called for acute on chronic hypercapnic respiratory  failure requiring continuous BiPAP. Pt returned to med surg on 4/21, but moved back to CCU on 4/22 for increased respiratory distress with hypoxemia and back on BiPAP. Pt intubated on 07/12/16 and extubated on 07/18/16. New PT orders recieved on 07/18/16 for re-eval and pt currently on 4L O2. Pt has PMH of anxiety, arthritis, CHF, COPD, HTN, hypothyroidism, restless leg syndrome, sleep apnea, s/p back surgery (cervical), foot arthroplasty, L total shoulder arthroplasty, and TKR. Pt wears 3L of O2 PRN at baseline.     PAST MEDICAL HISTORY :   has a past medical history of Anxiety; Arthritis; CHF (congestive heart failure) ([place]); COPD (chronic obstructive pulmonary disease) ([place]); Hypertension; Hypothyroidism; RLS (restless legs syndrome); Shortness of breath; and Sleep apnea.  has a past surgical history that includes Back surgery; Joint replacement; Foot arthroplasty; Total shoulder arthroplasty (Left, 07/02/2013); and Breast biopsy (Left). Prior to Admission medications   Medication Sig Start Date End Date Taking? Authorizing Provider  ALPRAZolam (XANAX) 0.25 MG tablet Take 0.25 mg by mouth 3 (three) times daily as needed for anxiety.   Yes [provider]  aspirin EC 81 MG tablet Take 81 mg by mouth daily.   Yes [provider]  azelastine (ASTELIN) 0.1 % nasal spray Place 2 sprays into both nostrils 2 (two) times daily. Use in each nostril as directed   Yes [provider]  B Complex-C (B-COMPLEX WITH VITAMIN C) tablet Take 1 tablet by mouth daily.   Yes [provider]  Calcium Carbonate-Vitamin D (CALCIUM-VITAMIN D) 500-200 MG-UNIT per tablet Take 1 tablet by mouth daily.   Yes [provider]  cyclobenzaprine (FLEXERIL) 10 MG tablet Take 0.5-1 mg by mouth 3 (three) times daily as needed for muscle spasms.   Yes [provider]  ferrous sulfate 325 (65 FE) MG tablet Take 325 mg by mouth daily with breakfast.   Yes [provider]    furosemide (LASIX) 20 MG tablet Take 1 tablet (20 mg total) by mouth daily. Patient taking differently: Take 40 mg by mouth 2 (two) times daily.  07/24/16  Yes Demetrios Loll, MD  gabapentin (NEURONTIN) 300 MG capsule Take 300 mg by mouth 2 (two) times daily.   Yes [provider]  ibuprofen (ADVIL,MOTRIN) 200 MG tablet Take 200 mg by mouth every 6 (six) hours as needed.   Yes [provider]  ipratropium-albuterol (DUONEB) 0.5-2.5 (3) MG/3ML SOLN Take 3 mLs by nebulization every 4 (four) hours. Patient taking differently: Take 3 mLs by nebulization every 4 (four) hours as needed.  07/21/15  Yes Theodoro Grist, MD  levothyroxine (SYNTHROID, LEVOTHROID) 200 MCG tablet Take 200 mcg by mouth daily before breakfast.   Yes [provider]  losartan (COZAAR) 25 MG tablet Take 25 mg by mouth daily.   Yes [provider]  nystatin (NYSTATIN) powder Apply 1 g topically 2 (two) times daily.   Yes [provider]  Omega-3 Fatty Acids (OMEGA 3 PO) Take 1 capsule by mouth daily.   Yes [provider]  oxybutynin (DITROPAN) 5 MG tablet Take 5 mg by mouth 3 (three) times daily.   Yes [provider]  potassium chloride SA (K-DUR,KLOR-CON) 20 MEQ tablet Take 20 mEq by mouth 2 (two) times daily.   Yes [provider]  pramipexole (MIRAPEX) 1 MG tablet Take 4 mg by mouth at bedtime.    Yes [provider]  roflumilast (DALIRESP) 500 MCG TABS tablet Take 1 tablet (500 mcg total) by mouth daily. 02/16/16  Yes Flora Lipps, MD  tiotropium (SPIRIVA) 18 MCG inhalation capsule Place 18 mcg into inhaler and inhale daily.   Yes [provider]  traMADol (ULTRAM) 50 MG tablet Take 50 mg by mouth every 8 (eight) hours as needed.   Yes [provider]  zolpidem (AMBIEN) 10 MG tablet Take 5 mg by mouth at bedtime as needed for sleep.   Yes [provider]   Allergies  Allergen Reactions  . Lyrica [Pregabalin] Other (See  Comments)    Reaction: made Restless Leg Syndrome worse     FAMILY HISTORY:  family history includes Diabetes in her mother; Heart failure in her father; Hypertension in her mother. SOCIAL HISTORY:  reports that she quit smoking about 41 years ago. She has a 10.00 pack-year smoking history. She has never used smokeless tobacco. She reports that she drinks alcohol. She reports that she does not use drugs.  REVIEW OF SYSTEMS:   Unable to assess pt lethargic on Bipap  SUBJECTIVE:  Pt remains on Bipap  VITAL SIGNS: Temp:  [98.6 F (37 C)] 98.6 F (37 C) (05/16 0140) Pulse Rate:  [80-106] 81 (05/16 0711) Resp:  [17-34] 21 (05/16 0711) BP: (81-159)/(53-138) 109/63 (05/16 0711) SpO2:  [73 %-100 %] 97 % (05/16 0711) Weight:  [114.3 kg (252 lb)] 114.3 kg (252 lb) (05/16 0141)  PHYSICAL EXAMINATION: General: well developed, well nourished Caucasian female Neuro: follows commands, PERRL  HEENT: supple, No JVD Cardiovascular: nsr, s1s2, no M/R/G Lungs: diminished throughout, even, non labored  Abdomen: +BS x4, soft, obese, non tender, non distended  Musculoskeletal: 2+ bilateral lower  extremity edema, normal tone Skin: intact no rashes or lesions   Recent Labs Lab 08/01/16 0149  NA 146*  K 3.5  CL 91*  CO2 48*  BUN 13  CREATININE 0.62  GLUCOSE 113*    Recent Labs Lab 08/01/16 0149  HGB 12.4  HCT 38.6  WBC 5.0  PLT 143*   Ct Head Wo Contrast  Result Date: 08/01/2016 CLINICAL DATA:  Altered mental status.  Hypoxia. EXAM: CT HEAD WITHOUT CONTRAST TECHNIQUE: Contiguous axial images were obtained from the base of the skull through the vertex without intravenous contrast. COMPARISON:  None. FINDINGS: Brain: Age related atrophy. Minimal chronic small vessel ischemia. Remote lacunar infarct versus prominent perivascular space in the left basal ganglia. No hemorrhage, evidence acute infarct, mass effect or midline shift. No hydrocephalus, basilar cisterns are patent. Vascular:  Atherosclerosis of skullbase vasculature without hyperdense vessel or abnormal calcification. Skull: No fracture or focal lesion. Sinuses/Orbits: Fluid in the right and left sphenoid sinus. Fluid level in left maxillary sinus. Mastoid air cells are well-aerated. Other: None. IMPRESSION: Age related atrophy with minimal chronic small vessel ischemia. No acute intracranial abnormality. Fluid levels in the sphenoid sinuses and left maxillary sinus, may be secondary to recent intubation or sinusitis. Electronically Signed   By: Jeb Levering M.D.   On: 08/01/2016 03:37   Dg Chest Portable 1 View  Result Date: 08/01/2016 CLINICAL DATA:  Altered mental status and hypoxia EXAM: PORTABLE CHEST 1 VIEW COMPARISON:  Chest radiograph 07/21/2016 FINDINGS: There is unchanged massive cardiomegaly. There dense bibasilar consolidations with bilateral pleural effusions. There is mild pulmonary edema. No pneumothorax. IMPRESSION: 1. Unchanged massive enlargement of the cardiomediastinal silhouette, indicating pericardial effusion versus cardiomegaly alone. 2. Bibasilar consolidation, likely atelectasis, and bilateral pleural effusions with mild pulmonary edema, consistent with congestive heart failure. Electronically Signed   By: Ulyses Jarred M.D.   On: 08/01/2016 02:07    ASSESSMENT / PLAN: Acute on Chronic Hypoxic Hypercapnic Respiratory Failure secondary to AECOPD and CHF exacerbation Acute Encephalopathy secondary to hypercapnia Hx: HTN, OSA, Mechanical Intubation, Anxiety, and Hypothyroidism P: Continuous Bipap for now wean as tolerated, will need Bipap or Cpap qhs Maintain O2 sats 88% to 92%  Scheduled and prn bronchodilator therapy Intermittent CXR Repeat ABG today IV steroids Continuous telemetry monitoring IV lasix Cardiology consulted appreciate input  Daily weights Trend BMP Replace electrolytes as indicated Monitor UOP Lovenox for VTE prophylaxis Monitor for s/sx of bleeding Trend  CBC Transfuse for hgb <7 TSH pending Continue outpatient synthroid  Prn hydralazine for bp management  -Will need to discuss code status with family today 08/01/16  Marda Stalker, [place] Pager [phone] (please enter 7 digits) PCCM Consult Pager [phone] (please enter 7 digits)  Pt seen and examined with NP, agree with findings, assessment, plan.  Pt was DC from a complicated hospitalization from 07/02/16 to 07/24/16 for volume overload. She was initially admitted to ICU, transferred to floor on 4/17;  Back to ICU on 4/18 for bipap, then back to floor on 4/21.  Back to ICU on 4/22 for bipap.  Intubated on 4/26 underwent thoracentesis on 5/01, extubated on 5/02.   Currently the patient has Acute respiratory failure; I personally reviewed CXR images, there is bibasilar atelectasis vs. Effusions, R>L which appear slightly worse than on previous imaging from discharge. Will check CT chest to r/o PE, if effusion present will consider thoracentesis. Pt currently DNR, however they might consider intubation (as on last admission) if there is a pointed plan  to make her better.  Will Continue diuresis, bipap and wean as tolerated. Venous blood gas shows acute hypercapnic and hypoxic respiratory failure.  NP discussed case with husband who requested to [facility]. Per charting, the patient vomited at [facility], and there is concern that she may have aspirated. Will start empiric steroids and abx for aspiration pneumonitis/pneumonia. Marda Stalker, M.D.  08/01/2016

## 2016-08-01 NOTE — ED Notes (Signed)
Husband at bedside at this time.

## 2016-08-01 NOTE — Progress Notes (Signed)
Pt. Was transported to CT and back to ICU while on the bipap.

## 2016-08-01 NOTE — Progress Notes (Signed)
Pharmacy Antibiotic Note  Hannah Powell is a 71 y.o. female with a h/o COPD and CHF admitted on 08/01/2016 with acute respiratory failure.  Pharmacy has been consulted for Zosyn dosing for possible aspiration PNA.  Plan: Zosyn 3.375g IV q8h (4 hour infusion).  Height: 5\' 3"  (160 cm) Weight: 243 lb 6.2 oz (110.4 kg) IBW/kg (Calculated) : 52.4  Temp (24hrs), Avg:98.8 F (37.1 C), Min:98.6 F (37 C), Max:99 F (37.2 C)   Recent Labs Lab 08/01/16 0149  WBC 5.0  CREATININE 0.62    Estimated Creatinine Clearance: 79.2 mL/min (by C-G formula based on SCr of 0.62 mg/dL).    Allergies  Allergen Reactions  . Lyrica [Pregabalin] Other (See Comments)    Reaction: made Restless Leg Syndrome worse     Antimicrobials this admission: Zosyn 5/16 >>  Dose adjustments this admission:   Microbiology results: 5/16 BCx: sent 5/16 UCx: sent  5/16 MRSA PCR: negative  Thank you for allowing pharmacy to be a part of this patient's care.  [name] 08/01/2016 2:17 PM

## 2016-08-01 NOTE — ED Notes (Signed)
Spoke with pt's husband about the pt's presenting condition and c/o's. Husband reports being with the pt up until 5-530 pm yesterday, and denies anything unsual in her behavior. Except for being a little more drowsy than usual at dinner time (he does state she was able to eat a grilled cheese sandwich and chips, and was told the drowsiness might have been caused by nausea medication; states he was never told the pt had vomited or witnessed the same himself), pt's husband states she was at her normal baseline.

## 2016-08-01 NOTE — H&P (Signed)
[facility] [place] NAME: Hannah Powell    MR#:  [id number]  DATE OF BIRTH:  08-Mar-1946  DATE OF ADMISSION:  08/01/2016  PRIMARY CARE PHYSICIAN: Adin Hector, MD   REQUESTING/REFERRING PHYSICIAN: dr Dahlia Client  CHIEF COMPLAINT:   AMS HISTORY OF PRESENT ILLNESS:  Hannah Powell  is a 71 y.o. female with a known history of Respiratory failure due to COPD and chronic diastolic heart failure he was discharged from the hospital in early May after prolonged hospitalization for acute on chronic hypoxic/hypercarbic respiratory failure due to COPD and CHF requiring intubation who presents from skilled nursing facility due to altered mental status. Patient was found to be with altered mental status and cyanotic lips at the beginning of third shift. Her O2 saturations were in the 60s. The facility placed on 15 L nonrebreather and O2 saturations improved to mid 90s. The facility is reporting the patient has been lethargic through the day. According to the son was at bedside patient had a very good day yesterday and was very active at rehabilitation. Patient arrived on 15 L nonrebreather mask via EMS. She was placed on BiPAP in the emergency room. CBG was reported at 105. VBG shows severe CO2 narcosis.  PAST MEDICAL HISTORY:   Past Medical History:  Diagnosis Date  . Anxiety   . Arthritis   . CHF (congestive heart failure) ([place])   . COPD (chronic obstructive pulmonary disease) ([place])   . Hypertension    dr Otho Najjar     . Hypothyroidism   . RLS (restless legs syndrome)   . Shortness of breath   . Sleep apnea    cpap      >5 yrs    PAST SURGICAL HISTORY:   Past Surgical History:  Procedure Laterality Date  . BACK SURGERY     cervical  . BREAST BIOPSY Left    needle bx-neg  . FOOT ARTHROPLASTY    . JOINT REPLACEMENT     x2 tkr  . TOTAL SHOULDER ARTHROPLASTY Left 07/02/2013   Procedure: LEFT TOTAL SHOULDER ARTHROPLASTY;  Surgeon: Marin Shutter,  MD;  Location: [facility];  Service: Orthopedics;  Laterality: Left;    SOCIAL HISTORY:   Social History  Substance Use Topics  . Smoking status: Former Smoker    Packs/day: 1.00    Years: 10.00    Quit date: 03/20/1975  . Smokeless tobacco: Never Used  . Alcohol use Yes     Comment: occ    FAMILY HISTORY:   Family History  Problem Relation Age of Onset  . Hypertension Mother   . Diabetes Mother   . Heart failure Father   . Breast cancer Neg Hx     DRUG ALLERGIES:   Allergies  Allergen Reactions  . Lyrica [Pregabalin] Other (See Comments)    Reaction: made Restless Leg Syndrome worse     REVIEW OF SYSTEMS:   Review of Systems  Unable to perform ROS: Acuity of condition    MEDICATIONS AT HOME:   Prior to Admission medications   Medication Sig Start Date End Date Taking? Authorizing Provider  ALPRAZolam (XANAX) 0.25 MG tablet Take 0.25 mg by mouth 3 (three) times daily as needed for anxiety.   Yes [provider]  aspirin EC 81 MG tablet Take 81 mg by mouth daily.   Yes [provider]  azelastine (ASTELIN) 0.1 % nasal spray Place 2 sprays into both nostrils 2 (two) times daily. Use in  each nostril as directed   Yes [provider]  B Complex-C (B-COMPLEX WITH VITAMIN C) tablet Take 1 tablet by mouth daily.   Yes [provider]  Calcium Carbonate-Vitamin D (CALCIUM-VITAMIN D) 500-200 MG-UNIT per tablet Take 1 tablet by mouth daily.   Yes [provider]  cyclobenzaprine (FLEXERIL) 10 MG tablet Take 0.5-1 mg by mouth 3 (three) times daily as needed for muscle spasms.   Yes [provider]  ferrous sulfate 325 (65 FE) MG tablet Take 325 mg by mouth daily with breakfast.   Yes [provider]  furosemide (LASIX) 20 MG tablet Take 1 tablet (20 mg total) by mouth daily. Patient taking differently: Take 40 mg by mouth 2 (two) times daily.  07/24/16  Yes Demetrios Loll, MD  gabapentin (NEURONTIN) 300 MG capsule Take 300 mg  by mouth 2 (two) times daily.   Yes [provider]  ibuprofen (ADVIL,MOTRIN) 200 MG tablet Take 200 mg by mouth every 6 (six) hours as needed.   Yes [provider]  ipratropium-albuterol (DUONEB) 0.5-2.5 (3) MG/3ML SOLN Take 3 mLs by nebulization every 4 (four) hours. Patient taking differently: Take 3 mLs by nebulization every 4 (four) hours as needed.  07/21/15  Yes Theodoro Grist, MD  levothyroxine (SYNTHROID, LEVOTHROID) 200 MCG tablet Take 200 mcg by mouth daily before breakfast.   Yes [provider]  losartan (COZAAR) 25 MG tablet Take 25 mg by mouth daily.   Yes [provider]  nystatin (NYSTATIN) powder Apply 1 g topically 2 (two) times daily.   Yes [provider]  Omega-3 Fatty Acids (OMEGA 3 PO) Take 1 capsule by mouth daily.   Yes [provider]  oxybutynin (DITROPAN) 5 MG tablet Take 5 mg by mouth 3 (three) times daily.   Yes [provider]  potassium chloride SA (K-DUR,KLOR-CON) 20 MEQ tablet Take 20 mEq by mouth 2 (two) times daily.   Yes [provider]  pramipexole (MIRAPEX) 1 MG tablet Take 4 mg by mouth at bedtime.    Yes [provider]  roflumilast (DALIRESP) 500 MCG TABS tablet Take 1 tablet (500 mcg total) by mouth daily. 02/16/16  Yes Flora Lipps, MD  tiotropium (SPIRIVA) 18 MCG inhalation capsule Place 18 mcg into inhaler and inhale daily.   Yes [provider]  traMADol (ULTRAM) 50 MG tablet Take 50 mg by mouth every 8 (eight) hours as needed.   Yes [provider]  zolpidem (AMBIEN) 10 MG tablet Take 5 mg by mouth at bedtime as needed for sleep.   Yes [provider]      VITAL SIGNS:  Blood pressure 134/71, pulse 88, temperature 98.6 F (37 C), temperature source Axillary, resp. rate (!) 22, height 5\' 3"  (1.6 m), weight 114.3 kg (252 lb), SpO2 95 %.  PHYSICAL EXAMINATION:   Physical Exam  Constitutional: She is well-developed, well-nourished, and in no  distress. No distress.  lethargic  HENT:  Head: Normocephalic.  Eyes: No scleral icterus.  Left eye with  Neck: Normal range of motion. Neck supple. No JVD present. No tracheal deviation present. Thyromegaly present.  Cardiovascular: Normal rate, regular rhythm and normal heart sounds.  Exam reveals no gallop and no friction rub.   No murmur heard. Pulmonary/Chest: Effort normal and breath sounds normal. No respiratory distress. She has no wheezes. She has no rales. She exhibits no tenderness.  Abdominal: Soft. Bowel sounds are normal. She exhibits no distension and no mass. There is no tenderness.  There is no rebound and no guarding.  Musculoskeletal: She exhibits edema.  Neurological:  Lethargic  Skin: Skin is warm. No rash noted. No erythema.  Psychiatric:  Lethargic      LABORATORY PANEL:   CBC  Recent Labs Lab 08/01/16 0149  WBC 5.0  HGB 12.4  HCT 38.6  PLT 143*   ------------------------------------------------------------------------------------------------------------------  Chemistries   Recent Labs Lab 08/01/16 0149  NA 146*  K 3.5  CL 91*  CO2 48*  GLUCOSE 113*  BUN 13  CREATININE 0.62  CALCIUM 8.8*  AST 27  ALT 18  ALKPHOS 74  BILITOT 0.8   ------------------------------------------------------------------------------------------------------------------  Cardiac Enzymes  Recent Labs Lab 08/01/16 0149  TROPONINI <0.03   ------------------------------------------------------------------------------------------------------------------  RADIOLOGY:  Ct Head Wo Contrast  Result Date: 08/01/2016 CLINICAL DATA:  Altered mental status.  Hypoxia. EXAM: CT HEAD WITHOUT CONTRAST TECHNIQUE: Contiguous axial images were obtained from the base of the skull through the vertex without intravenous contrast. COMPARISON:  None. FINDINGS: Brain: Age related atrophy. Minimal chronic small vessel ischemia. Remote lacunar infarct versus prominent perivascular  space in the left basal ganglia. No hemorrhage, evidence acute infarct, mass effect or midline shift. No hydrocephalus, basilar cisterns are patent. Vascular: Atherosclerosis of skullbase vasculature without hyperdense vessel or abnormal calcification. Skull: No fracture or focal lesion. Sinuses/Orbits: Fluid in the right and left sphenoid sinus. Fluid level in left maxillary sinus. Mastoid air cells are well-aerated. Other: None. IMPRESSION: Age related atrophy with minimal chronic small vessel ischemia. No acute intracranial abnormality. Fluid levels in the sphenoid sinuses and left maxillary sinus, may be secondary to recent intubation or sinusitis. Electronically Signed   By: Jeb Levering M.D.   On: 08/01/2016 03:37   Dg Chest Portable 1 View  Result Date: 08/01/2016 CLINICAL DATA:  Altered mental status and hypoxia EXAM: PORTABLE CHEST 1 VIEW COMPARISON:  Chest radiograph 07/21/2016 FINDINGS: There is unchanged massive cardiomegaly. There dense bibasilar consolidations with bilateral pleural effusions. There is mild pulmonary edema. No pneumothorax. IMPRESSION: 1. Unchanged massive enlargement of the cardiomediastinal silhouette, indicating pericardial effusion versus cardiomegaly alone. 2. Bibasilar consolidation, likely atelectasis, and bilateral pleural effusions with mild pulmonary edema, consistent with congestive heart failure. Electronically Signed   By: Ulyses Jarred M.D.   On: 08/01/2016 02:07    EKG:   Sinus tachycardia No ST elevation or depression           IMPRESSION AND PLAN:   71 year old female with history of chronic respiratory failure on 2-3 L of oxygen due to COPD and chronic diastolic heart failure who presents from the skilled nursing facility with altered mental status and CO2 narcosis.  1. Acute hypoxic/hypercarbic respiratory failure in the setting of COPD and CHF exacerbation BiPAP is not effective for this patient. She will need intubation. Discussed case with  the intensivist as well as ER physician who will intubate patient. Further management as per intensivist when she reaches ICU.  2. Acute on chronic diastolic heart failure with preserved ejection fraction: Continue IV Lasix Monitor I and O and daily weight Consider reordering echocardiogram due to abnormal chest x-ray [facility] cardiology consultation requested  3. Acute COPD exacerbation: Start IV steroids and DuoNeb's  4. Essential hypertension: When necessary hydralazine has been ordered  5. Hypothyroidism: IV Synthroid has been ordered  6. Mild hypernatremia: Repeat in am after diuresis   All the records are reviewed and case discussed with ED provider. Management plans discussed with the patient's son and he is in agreement  CODE  STATUS: FULL  Critical care TOTAL TIME TAKING CARE OF THIS PATIENT: 60 minutes.    Rashaad Hallstrom M.D on 08/01/2016 at 6:57 AM  Between 7am to 6pm - Pager - [phone]  After 6pm go to [email or web address] - password [email or web address] [facility]  Office  [phone]  CC: Primary care physician; Adin Hector, MD

## 2016-08-01 NOTE — Progress Notes (Signed)
Lovenox dose adjusted to 40mg  q12hr per protocol due to BMI >40. Prudy Feeler, RPh

## 2016-08-01 NOTE — ED Provider Notes (Signed)
[facility] Emergency Department Provider Note   ____________________________________________   First MD Initiated Contact with Patient 08/01/16 0129     (approximate)  I have reviewed the triage vital signs and the nursing notes.   HISTORY  Chief Complaint Respiratory Distress and Altered Mental Status  The patient is unable to participate in the history due to being unresponsive.  HPI Hannah Powell is a 71 y.o. female who comes into the hospital today with unresponsiveness.According to EMS the patient was last seen normal sometime between 7 AM and 5 PM. They report that normally the patient is with it and talking can answer questions but currently she is not responsive. According to the nurse the second shuffle while the patient to sleep all day. They state that she did vomit and had a possible aspiration. This evening the patient was found with O2 sats of 60% and the patient couldn't answer any questions. When the patient's husband arrived he reports that she actually had a great day. He reports that he spent most of the day with her and she did multiple episodes of physical therapy. The patient spent time with her friends but he states that around 4 PM she did start to seem a little sleepier. He reports that he left [facility] around 5 but the patient was still acting within normal limits. He states that this is been one of her best days and she hadn't had any complaints today. The patient is unable to answer any questions regarding her history.   Past Medical History:  Diagnosis Date  . Anxiety   . Arthritis   . CHF (congestive heart failure) ([place])   . COPD (chronic obstructive pulmonary disease) ([place])   . Hypertension    dr Otho Najjar     . Hypothyroidism   . RLS (restless legs syndrome)   . Shortness of breath   . Sleep apnea    cpap      >5 yrs    Patient Active Problem List   Diagnosis Date Noted  . Pulmonary infiltrates   .  Pneumonia due to Streptococcus pneumoniae ([place])   . Fever   . Community acquired pneumonia of right lung   . Palliative care by specialist   . Goals of care, counseling/discussion   . Pressure injury of skin 07/03/2016  . Acute respiratory failure ([place]) 07/02/2016  . Acute on chronic diastolic CHF (congestive heart failure) ([place]) 07/21/2015  . COPD exacerbation ([place]) 07/21/2015  . Acute renal insufficiency 07/21/2015  . Hyperglycemia 07/21/2015  . Benign essential HTN 07/21/2015  . Acute on chronic respiratory failure with hypoxia and hypercapnia ([place]) 07/18/2015  . S/P shoulder replacement 07/02/2013    Past Surgical History:  Procedure Laterality Date  . BACK SURGERY     cervical  . BREAST BIOPSY Left    needle bx-neg  . FOOT ARTHROPLASTY    . JOINT REPLACEMENT     x2 tkr  . TOTAL SHOULDER ARTHROPLASTY Left 07/02/2013   Procedure: LEFT TOTAL SHOULDER ARTHROPLASTY;  Surgeon: Marin Shutter, MD;  Location: [place];  Service: Orthopedics;  Laterality: Left;    Prior to Admission medications   Medication Sig Start Date End Date Taking? Authorizing Provider  ALPRAZolam (XANAX) 0.25 MG tablet Take 0.25 mg by mouth 3 (three) times daily as needed for anxiety.   Yes [provider]  aspirin EC 81 MG tablet Take 81 mg by mouth daily.   Yes [provider]  azelastine (ASTELIN) 0.1 %  nasal spray Place 2 sprays into both nostrils 2 (two) times daily. Use in each nostril as directed   Yes [provider]  B Complex-C (B-COMPLEX WITH VITAMIN C) tablet Take 1 tablet by mouth daily.   Yes [provider]  Calcium Carbonate-Vitamin D (CALCIUM-VITAMIN D) 500-200 MG-UNIT per tablet Take 1 tablet by mouth daily.   Yes [provider]  cyclobenzaprine (FLEXERIL) 10 MG tablet Take 0.5-1 mg by mouth 3 (three) times daily as needed for muscle spasms.   Yes [provider]  ferrous sulfate 325 (65 FE) MG tablet Take 325 mg by mouth daily with  breakfast.   Yes [provider]  furosemide (LASIX) 20 MG tablet Take 1 tablet (20 mg total) by mouth daily. Patient taking differently: Take 40 mg by mouth 2 (two) times daily.  07/24/16  Yes Demetrios Loll, MD  gabapentin (NEURONTIN) 300 MG capsule Take 300 mg by mouth 2 (two) times daily.   Yes [provider]  ibuprofen (ADVIL,MOTRIN) 200 MG tablet Take 200 mg by mouth every 6 (six) hours as needed.   Yes [provider]  ipratropium-albuterol (DUONEB) 0.5-2.5 (3) MG/3ML SOLN Take 3 mLs by nebulization every 4 (four) hours. Patient taking differently: Take 3 mLs by nebulization every 4 (four) hours as needed.  07/21/15  Yes Theodoro Grist, MD  levothyroxine (SYNTHROID, LEVOTHROID) 200 MCG tablet Take 200 mcg by mouth daily before breakfast.   Yes [provider]  losartan (COZAAR) 25 MG tablet Take 25 mg by mouth daily.   Yes [provider]  nystatin (NYSTATIN) powder Apply 1 g topically 2 (two) times daily.   Yes [provider]  Omega-3 Fatty Acids (OMEGA 3 PO) Take 1 capsule by mouth daily.   Yes [provider]  oxybutynin (DITROPAN) 5 MG tablet Take 5 mg by mouth 3 (three) times daily.   Yes [provider]  potassium chloride SA (K-DUR,KLOR-CON) 20 MEQ tablet Take 20 mEq by mouth 2 (two) times daily.   Yes [provider]  pramipexole (MIRAPEX) 1 MG tablet Take 4 mg by mouth at bedtime.    Yes [provider]  roflumilast (DALIRESP) 500 MCG TABS tablet Take 1 tablet (500 mcg total) by mouth daily. 02/16/16  Yes Flora Lipps, MD  tiotropium (SPIRIVA) 18 MCG inhalation capsule Place 18 mcg into inhaler and inhale daily.   Yes [provider]  traMADol (ULTRAM) 50 MG tablet Take 50 mg by mouth every 8 (eight) hours as needed.   Yes [provider]  zolpidem (AMBIEN) 10 MG tablet Take 5 mg by mouth at bedtime as needed for sleep.   Yes [provider]    Allergies Lyrica  [pregabalin]  Family History  Problem Relation Age of Onset  . Hypertension Mother   . Diabetes Mother   . Heart failure Father   . Breast cancer Neg Hx     Social History Social History  Substance Use Topics  . Smoking status: Former Smoker    Packs/day: 1.00    Years: 10.00    Quit date: 03/20/1975  . Smokeless tobacco: Never Used  . Alcohol use Yes     Comment: occ    Review of Systems  I am unable to fully complete the review of systems as the patient is unresponsive and she is unable to answer my questions. Constitutional: No fever/chills Cardiovascular: Denies chest pain. Respiratory:  shortness of breath. Gastrointestinal: Vomiting Musculoskeletal: Negative for back pain. Skin: Negative for  rash. .   ____________________________________________   PHYSICAL EXAM:  VITAL SIGNS: ED Triage Vitals  Enc Vitals Group     BP 08/01/16 0140 (!) 152/87     Pulse Rate 08/01/16 0140 (!) 101     Resp 08/01/16 0140 18     Temp 08/01/16 0140 98.6 F (37 C)     Temp Source 08/01/16 0140 Axillary     SpO2 08/01/16 0134 97 %     Weight 08/01/16 0141 252 lb (114.3 kg)     Height 08/01/16 0141 5\' 3"  (1.6 m)     Head Circumference --      [facility] --      Pain Score 08/01/16 0137 0     Pain Loc --      Pain Edu? --      Excl. in [place]? --     Constitutional: The patient is somnolent and minimally responsive. She is sitting on the stretcher with a nonrebreather in place and her head to the side Eyes: Conjunctivae are normal. Head: Atraumatic. Nose: No congestion/rhinnorhea. Mouth/Throat: Mucous membranes are moist.  Oropharynx non-erythematous. Cardiovascular: Tachycardia, regular rhythm. Grossly normal heart sounds.  Good peripheral circulation. Respiratory: Increased respiratory effort.  No retractions. Diminished breath sounds throughout Gastrointestinal: Soft and nontender. No distention. Positive bowel sounds Musculoskeletal: Bilateral lower extremity  edema Neurologic:  The patient does not answer questions but she did squeeze my fingers when I asked her to. The patient does groan to sternal rub. Skin:  Skin is warm, dry and intact. No rash noted. Psychiatric: The patient is minimally responsive and sleeping on the stretcher  ____________________________________________   LABS (all labs ordered are listed, but only abnormal results are displayed)  Labs Reviewed  CBC - Abnormal; Notable for the following:       Result Value   RDW 18.2 (*)    Platelets 143 (*)    All other components within normal limits  COMPREHENSIVE METABOLIC PANEL - Abnormal; Notable for the following:    Sodium 146 (*)    Chloride 91 (*)    CO2 48 (*)    Glucose, Bld 113 (*)    Calcium 8.8 (*)    Total Protein 6.3 (*)    Albumin 3.3 (*)    All other components within normal limits  BRAIN NATRIURETIC PEPTIDE - Abnormal; Notable for the following:    B Natriuretic Peptide 202.0 (*)    All other components within normal limits  BLOOD GAS, VENOUS - Abnormal; Notable for the following:    pH, Ven 7.22 (*)    pCO2, Ven 120 (*)    pO2, Ven 49.0 (*)    All other components within normal limits  AMMONIA - Abnormal; Notable for the following:    Ammonia 50 (*)    All other components within normal limits  TROPONIN I  URINALYSIS, COMPLETE (UACMP) WITH MICROSCOPIC   ____________________________________________  EKG  ED ECG REPORT I, Loney Hering, the attending physician, personally viewed and interpreted this ECG.   Date: 08/01/2016  EKG Time: 137  Rate: 102  Rhythm: sinus tachycardia  Axis: normal  Intervals:none  ST&T Change: none  ____________________________________________  RADIOLOGY  CT head CXR ____________________________________________   PROCEDURES  Procedure(s) performed: None  Procedures  Critical Care performed: Yes, see critical care note(s)  CRITICAL CARE Performed by: Charlesetta Ivory P   Total critical care  time: 30 minutes  Critical care time was exclusive of separately billable procedures and treating other  patients.  Critical care was necessary to treat or prevent imminent or life-threatening deterioration.  Critical care was time spent personally by me on the following activities: development of treatment plan with patient and/or surrogate as well as nursing, discussions with consultants, evaluation of patient's response to treatment, examination of patient, obtaining history from patient or surrogate, ordering and performing treatments and interventions, ordering and review of laboratory studies, ordering and review of radiographic studies, pulse oximetry and re-evaluation of patient's condition.  ____________________________________________   INITIAL IMPRESSION / ASSESSMENT AND PLAN / ED COURSE  Pertinent labs & imaging results that were available during my care of the patient were reviewed by me and considered in my medical decision making (see chart for details).  This is a 71 year old female who comes into the hospital today with some unresponsiveness. I did order ABG as well as ammonia. The patient's PCO2 was found to be 120 with a pH of 7.22. I feel that the patient's symptoms are due to CO2 narcosis. The patient also has some right basilar consolidation with a concern for effusions versus pulmonary edema. The patient is also edematous and her lower extremities. We did place the patient on BiPAP when she initially arrived. The patient has a MOST form which delineates that the patient is a DO NOT RESUSCITATE and limited medical intervention patient. I did ask the family specifically and they state that at this time they do not want her intubated. The patient received a CT of her head which was unremarkable. Once the blood work was obtained the decision was made to admit the patient to the hospitalist service into the ICU. The patient will be admitted.  Clinical Course as of Aug 02 618  Wed  Aug 01, 2016  0247 1. Unchanged massive enlargement of the cardiomediastinal silhouette, indicating pericardial effusion versus cardiomegaly alone. 2. Bibasilar consolidation, likely atelectasis, and bilateral pleural effusions with mild pulmonary edema, consistent with congestive heart failure.   DG Chest Portable 1 View [AW]  0400 Age related atrophy with minimal chronic small vessel ischemia. No acute intracranial abnormality.  Fluid levels in the sphenoid sinuses and left maxillary sinus, may be secondary to recent intubation or sinusitis.   CT Head Wo Contrast [AW]    Clinical Course User Index [AW] Loney Hering, MD    We will repeat the patient's VBG. She did have a few lowered blood pressures which is why I did not give the patient Lasix. ____________________________________________   FINAL CLINICAL IMPRESSION(S) / ED DIAGNOSES  Final diagnoses:  Altered mental status, unspecified altered mental status type  CO2 narcosis  Hypoxia      NEW MEDICATIONS STARTED DURING THIS VISIT:  New Prescriptions   No medications on file     Note:  This document was prepared using Dragon voice recognition software and may include unintentional dictation errors.    Loney Hering, MD 08/01/16 [phone]

## 2016-08-01 NOTE — Consult Note (Signed)
[facility] Cardiology Consultation Note  Patient ID: Hannah Powell, MRN: [id number], DOB/AGE: 1945/03/25 71 y.o. Admit date: 08/01/2016   Date of Consult: 08/01/2016 Primary Physician: Adin Hector, MD Primary Cardiologist: None  Chief Complaint:  Chief Complaint  Patient presents with  . Respiratory Distress  . Altered Mental Status   Reason for Consult: acute on chronic diastolic dysfunction heart failure with cardiomegaly  HPI: 71 y.o. female with known severe COPD and sleep apnea with acute on chronic respiratory distress and hypoxia. The patient has recently been admitted for prolonged hospitalization for COPD and exacerbation. The patient was discharged to home and then has returned with skin continue to hypoxia and bilateral pleural effusions with some pulmonary edema as well. The patient has received Lasix to improve hypoxia and diastolic heart failure with improvements as well. The patient has had BiPAP and now is less hypoxic and feels more comfortable. Chest x-ray does show pleural effusions also cardiomegaly although CAT scan currently shows no evidence of pericardial effusion. Pleural effusion likely multifactorial in nature. The patient does have some atherosclerosis of the aorta but currently no evidence of myocardial infarction  Past Medical History:  Diagnosis Date  . Anxiety   . Arthritis   . CHF (congestive heart failure) ([place])   . COPD (chronic obstructive pulmonary disease) ([place])   . Hypertension    dr Otho Najjar     . Hypothyroidism   . RLS (restless legs syndrome)   . Shortness of breath   . Sleep apnea    cpap      >5 yrs      Surgical History:  Past Surgical History:  Procedure Laterality Date  . BACK SURGERY     cervical  . BREAST BIOPSY Left    needle bx-neg  . FOOT ARTHROPLASTY    . JOINT REPLACEMENT     x2 tkr  . TOTAL SHOULDER ARTHROPLASTY Left 07/02/2013   Procedure: LEFT TOTAL SHOULDER ARTHROPLASTY;  Surgeon: Marin Shutter, MD;   Location: [place];  Service: Orthopedics;  Laterality: Left;     Home Meds: Prior to Admission medications   Medication Sig Start Date End Date Taking? Authorizing Provider  ALPRAZolam (XANAX) 0.25 MG tablet Take 0.25 mg by mouth 3 (three) times daily as needed for anxiety.   Yes [provider]  aspirin EC 81 MG tablet Take 81 mg by mouth daily.   Yes [provider]  azelastine (ASTELIN) 0.1 % nasal spray Place 2 sprays into both nostrils 2 (two) times daily. Use in each nostril as directed   Yes [provider]  B Complex-C (B-COMPLEX WITH VITAMIN C) tablet Take 1 tablet by mouth daily.   Yes [provider]  Calcium Carbonate-Vitamin D (CALCIUM-VITAMIN D) 500-200 MG-UNIT per tablet Take 1 tablet by mouth daily.   Yes [provider]  cyclobenzaprine (FLEXERIL) 10 MG tablet Take 0.5-1 mg by mouth 3 (three) times daily as needed for muscle spasms.   Yes [provider]  ferrous sulfate 325 (65 FE) MG tablet Take 325 mg by mouth daily with breakfast.   Yes [provider]  furosemide (LASIX) 20 MG tablet Take 1 tablet (20 mg total) by mouth daily. Patient taking differently: Take 40 mg by mouth 2 (two) times daily.  07/24/16  Yes Demetrios Loll, MD  gabapentin (NEURONTIN) 300 MG capsule Take 300 mg by mouth 2 (two) times daily.   Yes [provider]  ibuprofen (ADVIL,MOTRIN) 200 MG tablet Take 200  mg by mouth every 6 (six) hours as needed.   Yes [provider]  ipratropium-albuterol (DUONEB) 0.5-2.5 (3) MG/3ML SOLN Take 3 mLs by nebulization every 4 (four) hours. Patient taking differently: Take 3 mLs by nebulization every 4 (four) hours as needed.  07/21/15  Yes Theodoro Grist, MD  levothyroxine (SYNTHROID, LEVOTHROID) 200 MCG tablet Take 200 mcg by mouth daily before breakfast.   Yes [provider]  losartan (COZAAR) 25 MG tablet Take 25 mg by mouth daily.   Yes [provider]  nystatin (NYSTATIN)  powder Apply 1 g topically 2 (two) times daily.   Yes [provider]  Omega-3 Fatty Acids (OMEGA 3 PO) Take 1 capsule by mouth daily.   Yes [provider]  oxybutynin (DITROPAN) 5 MG tablet Take 5 mg by mouth 3 (three) times daily.   Yes [provider]  potassium chloride SA (K-DUR,KLOR-CON) 20 MEQ tablet Take 20 mEq by mouth 2 (two) times daily.   Yes [provider]  pramipexole (MIRAPEX) 1 MG tablet Take 4 mg by mouth at bedtime.    Yes [provider]  roflumilast (DALIRESP) 500 MCG TABS tablet Take 1 tablet (500 mcg total) by mouth daily. 02/16/16  Yes Flora Lipps, MD  tiotropium (SPIRIVA) 18 MCG inhalation capsule Place 18 mcg into inhaler and inhale daily.   Yes [provider]  traMADol (ULTRAM) 50 MG tablet Take 50 mg by mouth every 8 (eight) hours as needed.   Yes [provider]  zolpidem (AMBIEN) 10 MG tablet Take 5 mg by mouth at bedtime as needed for sleep.   Yes [provider]    Inpatient Medications:  . budesonide (PULMICORT) nebulizer solution  0.5 mg Nebulization BID  . chlorhexidine  15 mL Mouth Rinse BID  . enoxaparin (LOVENOX) injection  40 mg Subcutaneous Q12H  . furosemide  40 mg Intravenous BID  . ipratropium-albuterol  3 mL Nebulization Q4H  . levothyroxine  100 mcg Intravenous Daily  . mouth rinse  15 mL Mouth Rinse q12n4p  . methylPREDNISolone (SOLU-MEDROL) injection  40 mg Intravenous Q8H   . piperacillin-tazobactam (ZOSYN)  IV 3.375 g (08/01/16 1641)    Allergies:  Allergies  Allergen Reactions  . Lyrica [Pregabalin] Other (See Comments)    Reaction: made Restless Leg Syndrome worse     Social History   Social History  . Marital status: Married    Spouse name: N/A  . Number of children: N/A  . Years of education: N/A   Occupational History  . Not on file.   Social History Main Topics  . Smoking status: Former Smoker    Packs/day: 1.00    Years: 10.00    Quit date:  03/20/1975  . Smokeless tobacco: Never Used  . Alcohol use Yes     Comment: occ  . Drug use: No  . Sexual activity: Not on file   Other Topics Concern  . Not on file   Social History Narrative  . No narrative on file     Family History  Problem Relation Age of Onset  . Hypertension Mother   . Diabetes Mother   . Heart failure Father   . Breast cancer Neg Hx      Review of Systems Positive for Shortness of breath cough congestion Negative for: General:  chills, fever, night sweats or weight changes.  Cardiovascular: PND orthopnea syncope dizziness  Dermatological skin lesions rashes Respiratory: Positive for Cough congestion Urologic: Frequent urination urination at night  and hematuria Abdominal: negative for nausea, vomiting, diarrhea, bright red blood per rectum, melena, or hematemesis Neurologic: negative for visual changes, and/or hearing changes  All other systems reviewed and are otherwise negative except as noted above.  Labs:  Recent Labs  08/01/16 0149 08/01/16 1145  TROPONINI <0.03 <0.03   Lab Results  Component Value Date   WBC 5.0 08/01/2016   HGB 12.4 08/01/2016   HCT 38.6 08/01/2016   MCV 98.8 08/01/2016   PLT 143 (L) 08/01/2016    Recent Labs Lab 08/01/16 0149  NA 146*  K 3.5  CL 91*  CO2 48*  BUN 13  CREATININE 0.62  CALCIUM 8.8*  PROT 6.3*  BILITOT 0.8  ALKPHOS 74  ALT 18  AST 27  GLUCOSE 113*   Lab Results  Component Value Date   TRIG 142 07/17/2016   No results found for: DDIMER  Radiology/Studies:  Dg Chest 1 View  Result Date: 07/17/2016 CLINICAL DATA:  70 year old female status post thoracentesis. EXAM: CHEST 1 VIEW COMPARISON:  Chest x-ray 07/17/2016. FINDINGS: An endotracheal tube is in place with tip 3.4 cm above the carina. There is a left-sided internal jugular central venous catheter with tip terminating in the mid superior vena cava. A nasogastric tube is seen extending into the stomach, however, the tip of the  nasogastric tube extends below the lower margin of the image. Orthopedic fixation hardware in the lower cervical spine. Lung volumes are low. No pneumothorax. Previously noted right-sided pleural effusion has significantly decreased in size, nearly completely resolved. Small left pleural effusion. Patchy multifocal airspace consolidation scattered throughout the lungs bilaterally, similar to recent prior examinations, including a nodular appearing area in the right mid lung. No evidence of pulmonary edema. Heart size is mildly enlarged. Upper mediastinal contours are distorted by patient positioning. Aortic atherosclerosis. IMPRESSION: 1. Decreased size of small right pleural effusion following right-sided thoracentesis. No pneumothorax or other immediate complicating features. 2. Persistent small left pleural effusion. 3. Patchy multifocal interstitial and airspace disease throughout the lungs bilaterally, most compatible with multilobar bronchopneumonia. 4. Mild cardiomegaly. 5. Aortic atherosclerosis. Electronically Signed   By: Vinnie Langton M.D.   On: 07/17/2016 15:15   Dg Chest 1 View  Result Date: 07/10/2016 CLINICAL DATA:  Shortness of breath. EXAM: CHEST 1 VIEW COMPARISON:  07/09/2016. FINDINGS: Stable cardiomegaly. Persistent multifocal pulmonary infiltrates particularly the right upper lung. Atelectatic changes right upper lung. Persistent right apical pleural thickening. Small right pleural effusion. Interim improvement of left pleural effusion. Prior cervical spine fusion. Left shoulder replacement. Degenerative changes right shoulder. IMPRESSION: 1. Persistent multifocal pulmonary infiltrates particularly in the right upper lung. Persistent atelectatic changes right upper lung. Persistent right apical pleural thickening. 2. Small right pleural effusion. Interim improvement of left pleural effusion . Electronically Signed   By: Marcello Moores  Register   On: 07/10/2016 06:54   Dg Abd 1 View  Result  Date: 07/12/2016 CLINICAL DATA:  Post endotracheal tube placement, OG tube placement EXAM: ABDOMEN - 1 VIEW COMPARISON:  Portable chest x-ray of 07/10/2016 FINDINGS: The tip of the endotracheal tube is approximately 4.0 cm above the carina. Left IJ central venous line tip overlies the mid lower SVC. A NG tube extends into the stomach. The lungs appear better aerated. Bibasilar opacities remain consistent with atelectasis and possible small effusions. Pleuroparenchymal opacities are unchanged and the right lung apex. Technically the film is overpenetrated making assessment of the left lung difficult. IMPRESSION: 1. Endotracheal tube tip 4.0 cm above the carina. 2. NG  tube extends into the stomach. 3. Left central venous line tip overlies the mid lower SVC Electronically Signed   By: Ivar Drape M.D.   On: 07/12/2016 16:03   Ct Head Wo Contrast  Result Date: 08/01/2016 CLINICAL DATA:  Altered mental status.  Hypoxia. EXAM: CT HEAD WITHOUT CONTRAST TECHNIQUE: Contiguous axial images were obtained from the base of the skull through the vertex without intravenous contrast. COMPARISON:  None. FINDINGS: Brain: Age related atrophy. Minimal chronic small vessel ischemia. Remote lacunar infarct versus prominent perivascular space in the left basal ganglia. No hemorrhage, evidence acute infarct, mass effect or midline shift. No hydrocephalus, basilar cisterns are patent. Vascular: Atherosclerosis of skullbase vasculature without hyperdense vessel or abnormal calcification. Skull: No fracture or focal lesion. Sinuses/Orbits: Fluid in the right and left sphenoid sinus. Fluid level in left maxillary sinus. Mastoid air cells are well-aerated. Other: None. IMPRESSION: Age related atrophy with minimal chronic small vessel ischemia. No acute intracranial abnormality. Fluid levels in the sphenoid sinuses and left maxillary sinus, may be secondary to recent intubation or sinusitis. Electronically Signed   By: Jeb Levering M.D.    On: 08/01/2016 03:37   Ct Chest W Contrast  Result Date: 07/13/2016 CLINICAL DATA:  Acute kidney injury. Pneumonia and sepsis. Respiratory failure. Ventilator support. EXAM: CT CHEST, ABDOMEN, AND PELVIS WITH CONTRAST TECHNIQUE: Multidetector CT imaging of the chest, abdomen and pelvis was performed following the standard protocol during bolus administration of intravenous contrast. CONTRAST:  123mL ISOVUE-300 IOPAMIDOL (ISOVUE-300) INJECTION 61% COMPARISON:  Recent radiography FINDINGS: CT CHEST FINDINGS Cardiovascular: Aortic atherosclerosis. No aneurysm or dissection. Some coronary artery calcification. Mitral valve region calcification. No pericardial fluid. No definable pulmonary arterial abnormality. Mediastinum/Nodes: Small reactive mediastinal lymph nodes. Lungs/Pleura: Bilateral effusions layering dependently, larger on the right than the left. On the left, there are areas of patchy bronchopneumonia in the left upper lobe. The left lower lobe shows subtotal collapse. The lung not collapsed shows scattered patchy airspace densities. On the right, the upper lobe shows extensive infiltrate in subtotal collapse. The lower lobe shows subtotal collapse and moderate infiltrate. The right middle lobe shows moderate collapse and infiltrate. Musculoskeletal: Ordinary degenerative changes. Old compression fracture T7. Endotracheal tube tip is in the trachea just above the carina. Nasogastric tube enters the abdomen. CT ABDOMEN PELVIS FINDINGS Hepatobiliary: Normal Pancreas: Fatty replacement.  Otherwise normal. Spleen: Normal Adrenals/Urinary Tract: Adrenal glands are normal. No hydronephrosis. Poorly functioning kidneys. Benign appearing 2 cm cyst lateral right kidney. Stomach/Bowel: No acute or significant bowel finding. Vascular/Lymphatic: Aortic atherosclerosis. No aneurysm. IVC is normal. No retroperitoneal mass or adenopathy. Reproductive: Negative Other: No free fluid or air. Musculoskeletal: Old appearing  lower thoracic compression fracture. Extensive chronic degenerative changes throughout the lumbar spine. IMPRESSION: Bilateral effusions layering dependently, larger on the right than the left. Associated pulmonary collapse affecting all lobes to a degree with the exception of the left upper lobe. The portions of the lungs that are not collapsed show patchy airspace filling consistent with bronchopneumonia. No significant intraabdominal finding, other than delayed excretion by the kidneys. Electronically Signed   By: Nelson Chimes M.D.   On: 07/13/2016 18:05   Ct Angio Chest Pe W Or Wo Contrast  Result Date: 08/01/2016 CLINICAL DATA:  Acute respiratory failure. EXAM: CT ANGIOGRAPHY CHEST WITH CONTRAST TECHNIQUE: Multidetector CT imaging of the chest was performed using the standard protocol during bolus administration of intravenous contrast. Multiplanar CT image reconstructions and MIPs were obtained to evaluate the vascular anatomy. CONTRAST:  75  mL of Isovue 370 intravenously. COMPARISON:  CT scan of July 13, 2016. FINDINGS: Cardiovascular: Atherosclerosis of thoracic aorta is noted without aneurysm or dissection. There is no definite evidence of pulmonary embolus. No pericardial effusion is noted. Mediastinum/Nodes: No enlarged mediastinal, hilar, or axillary lymph nodes. Thyroid gland, trachea, and esophagus demonstrate no significant findings. Lungs/Pleura: Mild bilateral pleural effusions are noted with adjacent atelectasis or infiltrates. No pneumothorax is noted. Upper Abdomen: No acute abnormality. Musculoskeletal: Status post left shoulder arthroplasty. No acute abnormality seen. Review of the MIP images confirms the above findings. IMPRESSION: No definite evidence of pulmonary embolus. Aortic atherosclerosis. Mild bilateral pleural effusions are noted with adjacent subsegmental atelectasis or infiltrates. Electronically Signed   By: Marijo Conception, M.D.   On: 08/01/2016 16:35   Ct Abdomen Pelvis W  Contrast  Result Date: 07/13/2016 CLINICAL DATA:  Acute kidney injury. Pneumonia and sepsis. Respiratory failure. Ventilator support. EXAM: CT CHEST, ABDOMEN, AND PELVIS WITH CONTRAST TECHNIQUE: Multidetector CT imaging of the chest, abdomen and pelvis was performed following the standard protocol during bolus administration of intravenous contrast. CONTRAST:  11mL ISOVUE-300 IOPAMIDOL (ISOVUE-300) INJECTION 61% COMPARISON:  Recent radiography FINDINGS: CT CHEST FINDINGS Cardiovascular: Aortic atherosclerosis. No aneurysm or dissection. Some coronary artery calcification. Mitral valve region calcification. No pericardial fluid. No definable pulmonary arterial abnormality. Mediastinum/Nodes: Small reactive mediastinal lymph nodes. Lungs/Pleura: Bilateral effusions layering dependently, larger on the right than the left. On the left, there are areas of patchy bronchopneumonia in the left upper lobe. The left lower lobe shows subtotal collapse. The lung not collapsed shows scattered patchy airspace densities. On the right, the upper lobe shows extensive infiltrate in subtotal collapse. The lower lobe shows subtotal collapse and moderate infiltrate. The right middle lobe shows moderate collapse and infiltrate. Musculoskeletal: Ordinary degenerative changes. Old compression fracture T7. Endotracheal tube tip is in the trachea just above the carina. Nasogastric tube enters the abdomen. CT ABDOMEN PELVIS FINDINGS Hepatobiliary: Normal Pancreas: Fatty replacement.  Otherwise normal. Spleen: Normal Adrenals/Urinary Tract: Adrenal glands are normal. No hydronephrosis. Poorly functioning kidneys. Benign appearing 2 cm cyst lateral right kidney. Stomach/Bowel: No acute or significant bowel finding. Vascular/Lymphatic: Aortic atherosclerosis. No aneurysm. IVC is normal. No retroperitoneal mass or adenopathy. Reproductive: Negative Other: No free fluid or air. Musculoskeletal: Old appearing lower thoracic compression fracture.  Extensive chronic degenerative changes throughout the lumbar spine. IMPRESSION: Bilateral effusions layering dependently, larger on the right than the left. Associated pulmonary collapse affecting all lobes to a degree with the exception of the left upper lobe. The portions of the lungs that are not collapsed show patchy airspace filling consistent with bronchopneumonia. No significant intraabdominal finding, other than delayed excretion by the kidneys. Electronically Signed   By: Nelson Chimes M.D.   On: 07/13/2016 18:05   Dg Chest Portable 1 View  Result Date: 08/01/2016 CLINICAL DATA:  Altered mental status and hypoxia EXAM: PORTABLE CHEST 1 VIEW COMPARISON:  Chest radiograph 07/21/2016 FINDINGS: There is unchanged massive cardiomegaly. There dense bibasilar consolidations with bilateral pleural effusions. There is mild pulmonary edema. No pneumothorax. IMPRESSION: 1. Unchanged massive enlargement of the cardiomediastinal silhouette, indicating pericardial effusion versus cardiomegaly alone. 2. Bibasilar consolidation, likely atelectasis, and bilateral pleural effusions with mild pulmonary edema, consistent with congestive heart failure. Electronically Signed   By: Ulyses Jarred M.D.   On: 08/01/2016 02:07   Dg Chest Port 1 View  Result Date: 07/21/2016 CLINICAL DATA:  Acute respiratory failure. EXAM: PORTABLE CHEST 1 VIEW COMPARISON:  Radiograph Jul 18, 2016.  FINDINGS: Stable cardiomegaly. Endotracheal and nasogastric tubes have been removed. Left internal jugular catheter is again noted with distal tip in expected position of the SVC. Status post left shoulder arthroplasty. No pneumothorax is noted. Stable right upper lobe opacity is noted concerning for pneumonia. Mild bibasilar subsegmental atelectasis or infiltrates are noted with minimal pleural effusions. Bony thorax is unremarkable. IMPRESSION: Endotracheal and nasogastric tubes have been removed. Stable position of left internal jugular catheter.  Stable right upper lobe opacity concerning for possible pneumonia. Stable mild bibasilar subsegmental atelectasis or infiltrates with minimal pleural effusions. Electronically Signed   By: Marijo Conception, M.D.   On: 07/21/2016 07:11   Dg Chest Port 1 View  Result Date: 07/18/2016 CLINICAL DATA:  Respiratory failure. EXAM: PORTABLE CHEST 1 VIEW COMPARISON:  07/17/2016. FINDINGS: Endotracheal tube, NG tube, left IJ line stable position. Stable cardiomegaly. Patchy right upper lobe and bibasilar infiltrates again noted. Small bilateral pleural effusions again noted. No pneumothorax. Cervical spine fusion. Left shoulder replacement. IMPRESSION: 1. Lines and tubes in stable position. 2. Patchy right upper lobe and bibasilar infiltrates and small pleural effusions. No change. 3. Stable cardiomegaly . Electronically Signed   By: Marcello Moores  Register   On: 07/18/2016 06:45   Dg Chest Port 1 View  Result Date: 07/17/2016 CLINICAL DATA:  Respiratory failure. EXAM: PORTABLE CHEST 1 VIEW COMPARISON:  07/15/2016.  07/13/2016.  CT 07/13/2016. FINDINGS: Endotracheal tube, NG tube, left IJ line stable position. Stable cardiomegaly. Progressive right upper lobe infiltrate and/or edema. Persistent bibasilar infiltrates and/or edema. Small bilateral pleural effusions. No pneumothorax . Left shoulder replacement. IMPRESSION: 1. Lines and tubes in stable position . 2. Progressive right upper lobe infiltrate. Persistent bibasilar pulmonary infiltrates. Small bilateral pleural effusions. 3.  Cardiomegaly. Electronically Signed   By: Marcello Moores  Register   On: 07/17/2016 06:43   Dg Chest Port 1 View  Result Date: 07/15/2016 CLINICAL DATA:  Followup for pulmonary infiltrates. 71 y.o. Former smoker admitted 04/16 with acute on chronic hypercapnic hypoxic respiratory failure secondary to exacerbation of COPD and right sided pneumonia. Intubated 4/26. EXAM: PORTABLE CHEST 1 VIEW COMPARISON:  07/13/2016 FINDINGS: Patchy bilateral airspace  opacities, noted throughout the central right lung and in both lung bases, has improved, although significant consolidation persists. The bilateral pleural effusions appear decreased in size. There are no new lung abnormalities. No pneumothorax. The endotracheal tube, left internal jugular central venous line and nasogastric tube are stable. IMPRESSION: Improved lung aeration when compared the prior exam consistent with improving pneumonia and a decrease in bilateral pleural effusions. Electronically Signed   By: Lajean Manes M.D.   On: 07/15/2016 10:14   Dg Chest Port 1 View  Result Date: 07/13/2016 CLINICAL DATA:  71 year old female with acute respiratory failure. EXAM: PORTABLE CHEST 1 VIEW COMPARISON:  Chest radiograph dated 07/12/2016 FINDINGS: Endotracheal tube above the carina in similar positioning. Left IJ central line in stable position. Enteric tube extends into the left hemiabdomen with tip beyond the inferior margin of the image. There is mild cardiomegaly. There is prominence of the central pulmonary arteries likely representing a degree of underlying pulmonary hypertension. Bibasilar and right upper lobe airspace densities again noted, similar or slightly worsened. Small bilateral pleural effusions. No pneumothorax. Partially visualized lower cervical fusion hardware. Left shoulder hemiarthroplasty. No acute fracture. IMPRESSION: 1. Bibasilar and right upper lobe airspace opacities, similar or slightly worsened compared to the prior radiograph. 2. Small bilateral pleural effusions similar to prior radiograph. Electronically Signed   By: Laren Everts.D.  On: 07/13/2016 06:52   Dg Chest Port 1 View  Result Date: 07/12/2016 CLINICAL DATA:  Endotracheal tube placement. EXAM: PORTABLE CHEST 1 VIEW COMPARISON:  Chest radiograph July 10, 2016 FINDINGS: Endotracheal tube tip projects 3.5 cm above the carina. LEFT internal jugular central venous catheter distal tip projects in proximal  superior vena cava. Nasogastric tube side port projecting in proximal stomach, distal tip out of field-of-view. Increasing bibasilar airspace opacities, obscuring the hemidiaphragms. Cardiac silhouette is mildly enlarged, mildly calcified aortic knob. Improved RIGHT upper lobe consolidation with persistent interstitial prominence. Small pleural effusions. No pneumothorax. LEFT humeral arthroplasty, osseous structures unchanged. ACDF. IMPRESSION: Endotracheal tube tip projects 3.5 cm above the carina. LEFT internal jugular central venous catheter distal tip projects in proximal superior vena cava. Nasogastric tube side port projecting in proximal stomach, distal tip out of field-of-view. Worsening bibasilar consolidation with improved aeration of RIGHT upper lobe, findings suggest shifting pulmonary edema, pneumonia is possible. Small pleural effusions. Electronically Signed   By: Elon Alas M.D.   On: 07/12/2016 16:02   Dg Chest Port 1 View  Result Date: 07/09/2016 CLINICAL DATA:  Respiratory failure. EXAM: PORTABLE CHEST 1 VIEW COMPARISON:  07/07/2016 FINDINGS: Left shoulder arthroplasty and lower cervical spine fixation. Mildly degraded exam due to AP portable technique and patient body habitus. New or increased right and persistent small left pleural effusion. No pneumothorax. Mild interstitial edema persists. Worsened right upper lobe airspace disease. Increased bibasilar airspace opacities. IMPRESSION: Overall worsened aeration, with increased interstitial edema and multifocal airspace opacities. Persistent left and new or increased layering right pleural effusion. Electronically Signed   By: Abigail Miyamoto M.D.   On: 07/09/2016 07:28   Dg Chest Port 1 View  Result Date: 07/07/2016 CLINICAL DATA:  Initial evaluation for acute respiratory failure. EXAM: PORTABLE CHEST 1 VIEW COMPARISON:  Prior radiograph from 07/06/2016. FINDINGS: Stable cardiomegaly.  Mediastinal silhouette within normal limits.  Lungs mildly hypoinflated. Previously noted right upper lobe infiltrate again seen, improved from previous. Improved underlying vascular congestion as compared to prior. Increased density at the left lung base may reflect atelectasis or infiltrate. Possible small left pleural effusion. No pneumothorax. No acute osseus abnormality. Cervical ACDF and left shoulder arthroplasty noted. Degenerative changes noted at the right shoulder. IMPRESSION: 1. Persistent but improved right upper lobe infiltrate, suggesting improved pneumonia. 2. Improved underlying pulmonary vascular congestion as compared to 07/06/16. 3. Increased density at the left lung base, which may reflect atelectasis or infiltrate. Possible small left pleural effusion may be present. Electronically Signed   By: Jeannine Boga M.D.   On: 07/07/2016 03:43   Dg Chest Port 1 View  Result Date: 07/06/2016 CLINICAL DATA:  Pneumonia. EXAM: PORTABLE CHEST 1 VIEW COMPARISON:  Radiograph of July 04, 2016. FINDINGS: Stable cardiomegaly. No pneumothorax is noted. Increased right upper lobe opacity is noted consistent with pneumonia. Mild bibasilar subsegmental atelectasis or infiltrates are noted. Probable minimal bilateral pleural effusions are noted. Status post left shoulder arthroplasty. Degenerative joint disease is seen involving the right glenohumeral joint. IMPRESSION: Increased right upper lobe opacity is noted consistent with pneumonia. Mild bibasilar subsegmental atelectasis or infiltrates are noted. Electronically Signed   By: Marijo Conception, M.D.   On: 07/06/2016 10:21   Dg Chest Port 1 View  Result Date: 07/04/2016 CLINICAL DATA:  Respiratory failure EXAM: PORTABLE CHEST 1 VIEW COMPARISON:  07/02/2016 FINDINGS: Cardiac shadow is again mildly enlarged but stable. Diffuse right upper lobe infiltrate is seen unchanged from the prior exam. Left lung remains  clear. No new bony abnormality is noted. Postsurgical changes in left shoulder are  noted. Postsurgical changes are noted in the cervical spine. Loose screws are noted inferiorly but stable from multiple previous exams. IMPRESSION: Stable right upper lobe infiltrate. Electronically Signed   By: Inez Catalina M.D.   On: 07/04/2016 07:33   Dg Chest Port 1v Same Day  Result Date: 07/17/2016 CLINICAL DATA:  Pleural effusion EXAM: PORTABLE CHEST 1 VIEW COMPARISON:  07/17/2016 FINDINGS: Cardiac shadow is enlarged but stable. Endotracheal tube, nasogastric catheter and left jugular central line are again seen and stable. Improved aeration is noted bilaterally. Some persistent rounded density is noted in the right mid lung likely related to fluid in the minor fissure. Small pleural effusion is noted. Mild interstitial changes are seen. IMPRESSION: Significantly improved aeration when compared with the prior exam in the right lung. Fluid in the minor fissure remains. Tubes and lines as described. Electronically Signed   By: Inez Catalina M.D.   On: 07/17/2016 11:19   US Thoracentesis Asp Pleural Space W/img Guide  Result Date: 07/17/2016 INDICATION: Respiratory failure, ventilatory support, right pleural effusion EXAM: ULTRASOUND GUIDED RIGHT THORACENTESIS MEDICATIONS: 1% lidocaine locally COMPLICATIONS: None immediate. PROCEDURE: An ultrasound guided thoracentesis was thoroughly discussed with the patient and questions answered. The benefits, risks, alternatives and complications were also discussed. The patient understands and wishes to proceed with the procedure. Written consent was obtained. Ultrasound was performed to localize and mark an adequate pocket of fluid in the right chest. The area was then prepped and draped in the normal sterile fashion. 1% Lidocaine was used for local anesthesia. Under ultrasound guidance a 6 Fr Safe-T-Centesis catheter was introduced. Thoracentesis was performed. The catheter was removed and a dressing applied. FINDINGS: A total of approximately 500 cc of amber  colored pleural fluid was removed. Samples were sent to the laboratory as requested by the clinical team. IMPRESSION: Successful ultrasound guided right thoracentesis yielding 500 cc of pleural fluid. Electronically Signed   By: Jerilynn Mages.  Shick M.D.   On: 07/17/2016 14:34    EKG: Normal sinus rhythm with left axis and left atrial enlargement  Weights: Filed Weights   08/01/16 0141 08/01/16 1000  Weight: 114.3 kg (252 lb) 110.4 kg (243 lb 6.2 oz)     Physical Exam: Blood pressure 131/67, pulse 78, temperature 99 F (37.2 C), temperature source Axillary, resp. rate (!) 29, height 5\' 3"  (1.6 m), weight 110.4 kg (243 lb 6.2 oz), SpO2 90 %. Body mass index is 43.11 kg/m. General: Well developed, well nourished, in no acute distress. Head eyes ears nose throat: Normocephalic, atraumatic, sclera non-icteric, no xanthomas, nares are without discharge. No apparent thyromegaly and/or mass  Lungs: Normal respiratory effort.  Diffuse wheezes, few rales, some rhonchi. Decreased breath sounds in the base Heart: RRR with normal S1 S2. no murmur gallop, no rub, PMI is normal size and placement, carotid upstroke normal without bruit, jugular venous pressure is normal Abdomen: Soft, non-tender, non-distended with normoactive bowel sounds. No hepatomegaly. No rebound/guarding. No obvious abdominal masses. Abdominal aorta is normal size without bruit Extremities: Trace to 1+ edema. no cyanosis, no clubbing, no ulcers  Peripheral : 2+ bilateral upper extremity pulses, 2+ bilateral femoral pulses, 2+ bilateral dorsal pedal pulse Neuro: Alert and oriented. No facial asymmetry. No focal deficit. Moves all extremities spontaneously. Musculoskeletal: Normal muscle tone without kyphosis Psych:  Responds to questions appropriately with a normal affect.    Assessment: 71 year old female with acute on chronic respiratory failure multifactorial  in nature with the acute on chronic diastolic dysfunction heart failure with  pleural effusion pulmonary edema slightly improved with diuretics and no current evidence of myocardial infarction  Plan: 1. Continue intravenous Lasix for pleural effusions and pulmonary edema watching closely for chronic kidney disease exacerbation 2. Continue BiPAP for oxygenation due to respiratory distress 3. No need for repeat of echocardiogram showing normal LV systolic function with diastolic heart failure and previously no pericardial effusion with CAT scan suggesting no evidence of pericardial effusion 4. Hypertension control with hydralazine as needed 5. Begin ambulation if able following for need for adjustments of medication management  Signed, Corey Skains M.D. [facility] 08/01/2016, 5:10 PM

## 2016-08-01 NOTE — Progress Notes (Addendum)
Called [facility]  transfer center and discussed case with CICU fellow, who felt that patient was likely more appropriate for MICU.  Discussed with MICU attending, she felt that patient was unlikely to benefit from transfer as there was little else they would offer, but would accept the patient when beds are available, currently no beds are available in the MICU.  She asked that we update transfer center if patient is weaned from bipap, as pt would no longer be an ICU transfer candidate at that point.  Marda Stalker, M.D.  08/01/2016

## 2016-08-01 NOTE — Care Management (Signed)
Readmission. Patient discharged to [facility] on 07/24/16 for rehab after a long respiratory recovery that required intubation during last ICU visit. During the early stages of that visit in ICU I spoke with husband and patient's son regarding [facility] and LTAC- they never followed through with CM on that as patient finally recovered somewhat. Patient has history of [facility] for home health. RNCM will follow.

## 2016-08-01 NOTE — Progress Notes (Signed)
Good afternoon. Down to CT at 4pm. for chest CT. More alert this afternoon.

## 2016-08-01 NOTE — ED Notes (Signed)
Attempted report x1. 

## 2016-08-02 ENCOUNTER — Inpatient Hospital Stay: Payer: Medicare HMO

## 2016-08-02 DIAGNOSIS — J810 Acute pulmonary edema: Secondary | ICD-10-CM

## 2016-08-02 DIAGNOSIS — J690 Pneumonitis due to inhalation of food and vomit: Secondary | ICD-10-CM

## 2016-08-02 LAB — CBC
HCT: 32.6 % — ABNORMAL LOW (ref 35.0–47.0)
HEMOGLOBIN: 10.6 g/dL — AB (ref 12.0–16.0)
MCH: 31 pg (ref 26.0–34.0)
MCHC: 32.4 g/dL (ref 32.0–36.0)
MCV: 95.7 fL (ref 80.0–100.0)
Platelets: 144 10*3/uL — ABNORMAL LOW (ref 150–440)
RBC: 3.41 MIL/uL — ABNORMAL LOW (ref 3.80–5.20)
RDW: 17.4 % — ABNORMAL HIGH (ref 11.5–14.5)
WBC: 5.4 10*3/uL (ref 3.6–11.0)

## 2016-08-02 LAB — BASIC METABOLIC PANEL
ANION GAP: 12 (ref 5–15)
BUN: 17 mg/dL (ref 6–20)
CALCIUM: 8.8 mg/dL — AB (ref 8.9–10.3)
CO2: 45 mmol/L — ABNORMAL HIGH (ref 22–32)
CREATININE: 0.64 mg/dL (ref 0.44–1.00)
Chloride: 87 mmol/L — ABNORMAL LOW (ref 101–111)
GFR calc Af Amer: >60 mL/min (ref >60)
GLUCOSE: 123 mg/dL — AB (ref 65–99)
Potassium: 3.9 mmol/L (ref 3.5–5.1)
Sodium: 144 mmol/L (ref 135–145)

## 2016-08-02 LAB — URINE CULTURE: CULTURE: NO GROWTH

## 2016-08-02 LAB — TROPONIN I

## 2016-08-02 LAB — PROCALCITONIN: Procalcitonin: <0.1 ng/mL

## 2016-08-02 MED ORDER — ACETAMINOPHEN 325 MG PO TABS: 650.0000 mg | ORAL_TABLET | Freq: Four times a day (QID) | ORAL | Status: DC | PRN

## 2016-08-02 MED ORDER — LEVOTHYROXINE SODIUM 200 MCG PO TABS
200.0000 ug | ORAL_TABLET | Freq: Every day | ORAL | Status: DC
  Administered 2016-08-02 – 2016-08-06 (×5): 200 ug via ORAL
  Filled 2016-08-02 (×5): qty 1

## 2016-08-02 MED ORDER — PRAMIPEXOLE DIHYDROCHLORIDE 1 MG PO TABS: 4.0000 mg | ORAL_TABLET | Freq: Every day | ORAL | Status: DC

## 2016-08-02 MED ORDER — PRAMIPEXOLE DIHYDROCHLORIDE 1 MG PO TABS
4.0000 mg | ORAL_TABLET | Freq: Every day | ORAL | Status: DC
  Administered 2016-08-02 – 2016-08-05 (×5): 4 mg via ORAL
  Filled 2016-08-02 (×5): qty 4

## 2016-08-02 NOTE — Progress Notes (Signed)
Name: Salayah Meares MRN: [id number] DOB: 1946/02/13    ADMISSION DATE:  08/01/2016 CONSULTATION DATE: 08/01/2016  REFERRING MD : Dr. Benjie Karvonen  CHIEF COMPLAINT:  Altered mental status  BRIEF PATIENT DESCRIPTION:  71 yo female admitted 05/16 with altered mental status and acute on chronic hypoxic hypercapnic respiratory failure secondary to AECOPD and CHF exacerbation requiring continuous Bipap  SIGNIFICANT EVENTS  05/16-Pt admitted to ICU   STUDIES:  CT Head 05/16>>Age related atrophy with minimal chronic small vessel ischemia. No acute intracranial abnormality. Fluid levels in the sphenoid sinuses and left maxillary sinus, may be secondary to recent intubation or sinusitis   REVIEW OF SYSTEMS:   Unable to assess pt lethargic on Bipap  SUBJECTIVE:  Pt taken off bipap to hi-flow at 40% appears to be feeling better and looking better.   VITAL SIGNS: Temp:  [98.6 F (37 C)-99.1 F (37.3 C)] 98.8 F (37.1 C) (05/17 0811) Pulse Rate:  [75-94] 94 (05/17 0811) Resp:  [14-30] 14 (05/17 0811) BP: (103-156)/(56-99) 124/82 (05/17 0811) SpO2:  [90 %-98 %] 93 % (05/17 0811) FiO2 (%):  [36 %-46 %] 46 % (05/17 0445) Weight:  [243 lb 6.2 oz (110.4 kg)-247 lb 9.2 oz (112.3 kg)] 247 lb 9.2 oz (112.3 kg) (05/17 0500)  PHYSICAL EXAMINATION: General: well developed, well nourished Caucasian female Neuro: follows commands, PERRL  HEENT: supple, No JVD Cardiovascular: nsr, s1s2, no M/R/G Lungs: diminished throughout, even, non labored  Abdomen: +BS x4, soft, obese, non tender, non distended  Musculoskeletal: 2+ bilateral lower extremity edema, normal tone Skin: intact no rashes or lesions   Recent Labs Lab 08/01/16 0149 08/01/16 1716 08/02/16 0610  NA 146*  --  144  K 3.5 3.0* 3.9  CL 91*  --  87*  CO2 48*  --  45*  BUN 13  --  17  CREATININE 0.62  --  0.64  GLUCOSE 113*  --  123*    Recent Labs Lab 08/01/16 0149 08/02/16 0610  HGB 12.4 10.6*  HCT 38.6 32.6*  WBC 5.0  5.4  PLT 143* 144*   Ct Head Wo Contrast  Result Date: 08/01/2016 CLINICAL DATA:  Altered mental status.  Hypoxia. EXAM: CT HEAD WITHOUT CONTRAST TECHNIQUE: Contiguous axial images were obtained from the base of the skull through the vertex without intravenous contrast. COMPARISON:  None. FINDINGS: Brain: Age related atrophy. Minimal chronic small vessel ischemia. Remote lacunar infarct versus prominent perivascular space in the left basal ganglia. No hemorrhage, evidence acute infarct, mass effect or midline shift. No hydrocephalus, basilar cisterns are patent. Vascular: Atherosclerosis of skullbase vasculature without hyperdense vessel or abnormal calcification. Skull: No fracture or focal lesion. Sinuses/Orbits: Fluid in the right and left sphenoid sinus. Fluid level in left maxillary sinus. Mastoid air cells are well-aerated. Other: None. IMPRESSION: Age related atrophy with minimal chronic small vessel ischemia. No acute intracranial abnormality. Fluid levels in the sphenoid sinuses and left maxillary sinus, may be secondary to recent intubation or sinusitis. Electronically Signed   By: Jeb Levering M.D.   On: 08/01/2016 03:37   Ct Angio Chest Pe W Or Wo Contrast  Result Date: 08/01/2016 CLINICAL DATA:  Acute respiratory failure. EXAM: CT ANGIOGRAPHY CHEST WITH CONTRAST TECHNIQUE: Multidetector CT imaging of the chest was performed using the standard protocol during bolus administration of intravenous contrast. Multiplanar CT image reconstructions and MIPs were obtained to evaluate the vascular anatomy. CONTRAST:  75 mL of Isovue 370 intravenously. COMPARISON:  CT scan of July 13, 2016. FINDINGS:  Cardiovascular: Atherosclerosis of thoracic aorta is noted without aneurysm or dissection. There is no definite evidence of pulmonary embolus. No pericardial effusion is noted. Mediastinum/Nodes: No enlarged mediastinal, hilar, or axillary lymph nodes. Thyroid gland, trachea, and esophagus demonstrate no  significant findings. Lungs/Pleura: Mild bilateral pleural effusions are noted with adjacent atelectasis or infiltrates. No pneumothorax is noted. Upper Abdomen: No acute abnormality. Musculoskeletal: Status post left shoulder arthroplasty. No acute abnormality seen. Review of the MIP images confirms the above findings. IMPRESSION: No definite evidence of pulmonary embolus. Aortic atherosclerosis. Mild bilateral pleural effusions are noted with adjacent subsegmental atelectasis or infiltrates. Electronically Signed   By: Marijo Conception, M.D.   On: 08/01/2016 16:35   Dg Chest Port 1 View  Result Date: 08/02/2016 CLINICAL DATA:  Respiratory failure. EXAM: PORTABLE CHEST 1 VIEW COMPARISON:  Chest x-ray 08/01/2016, 07/21/2016.  CT 08/01/2016. FINDINGS: Cardiomegaly with mild pulmonary vascular prominence, bilateral interstitial prominence and bilateral pleural effusions consistent with CHF. Similar findings noted on prior exam. Low lung volumes with basilar atelectasis. No pneumothorax. Prior cervicothoracic spine fusion. Left shoulder replacement. IMPRESSION: 1. Findings consistent with congestive heart failure with bilateral pulmonary interstitial edema and bilateral pleural effusions. similar findings noted on prior exam. 2. Low lung volumes with basilar atelectasis again noted. Electronically Signed   By: Marcello Moores  Register   On: 08/02/2016 06:19   Dg Chest Portable 1 View  Result Date: 08/01/2016 CLINICAL DATA:  Altered mental status and hypoxia EXAM: PORTABLE CHEST 1 VIEW COMPARISON:  Chest radiograph 07/21/2016 FINDINGS: There is unchanged massive cardiomegaly. There dense bibasilar consolidations with bilateral pleural effusions. There is mild pulmonary edema. No pneumothorax. IMPRESSION: 1. Unchanged massive enlargement of the cardiomediastinal silhouette, indicating pericardial effusion versus cardiomegaly alone. 2. Bibasilar consolidation, likely atelectasis, and bilateral pleural effusions with mild  pulmonary edema, consistent with congestive heart failure. Electronically Signed   By: Ulyses Jarred M.D.   On: 08/01/2016 02:07    ASSESSMENT / PLAN: Acute on Chronic Hypoxic Hypercapnic Respiratory Failure secondary to AECOPD and CHF exacerbation Acute Encephalopathy secondary to hypercapnia Possible aspiration pneumonitis secondary to episode of vomiting.  Hx: HTN, OSA, Mechanical Intubation, Anxiety, and Hypothyroidism P: Continue to wean down oxygen as tolerated.  Maintain O2 sats 88% to 92%  Scheduled and prn bronchodilator therapy Wean down steroids as tolerated.  Continuous telemetry monitoring Continue diuresis.  Cardiology consulted appreciate input  Prn hydralazine for bp management  I personally review CXR and CT imaging, there is continued pulmonary edema with reduced lung volumes. Small to moderate bilateral pleural effusions.   Marda Stalker, M.D.  08/02/2016

## 2016-08-02 NOTE — Progress Notes (Signed)
Pt's husband is requesting that pt remain in ICU until 08-03-16. Husband states that he believes pt's CO2 is elevated; however, he states that she does not need an ABG. Pt is on 6 LPM nasal cannula at this time, satting 96%. Discussed with Dr. Juanell Fairly the possibility of transferring pt to the floor. Dr. Juanell Fairly stated that Dr. Mortimer Fries can have the conversation with the family when he comes on at 21 due to a previously established relationship with the family.

## 2016-08-02 NOTE — Discharge Instructions (Signed)
[facility] Failure [facility] appointment on Aug 09, 2016 at 9:20am with Darylene Price, [place]. Please call [phone] to reschedule.

## 2016-08-02 NOTE — Care Management (Addendum)
Met with patient and her husband to discuss discharge planning. Patient was progressing with PT per husband and "her CO2 level went up so they sent her back to the hospital". He does most of the talking however patient did agree to this and agreed with everything he stated. She is "on a CPAP" at [facility] and they "want to go back for rehabilitation".  PT evaluation will be needed for her return. A [facility] cannot be set up if patient returns to rehab. She has history of [facility] for home health services. RNCM will continue to follow. I spoke also about LTAC and they are not interested in pursuing this option.

## 2016-08-02 NOTE — Progress Notes (Signed)
[facility] NAME: Hannah Powell    MR#:  [id number]  DATE OF BIRTH:  10/19/1945  SUBJECTIVE:   Patient off of BiPAP and on high flow nasal cannula. Patient is doing well without any complaints  REVIEW OF SYSTEMS:    Review of Systems  Constitutional: Negative for fever, chills weight loss HENT: Negative for ear pain, nosebleeds, congestion, facial swelling, rhinorrhea, neck pain, neck stiffness and ear discharge.   Respiratory: Negative for cough, shortness of breath, wheezing  Cardiovascular: Negative for chest pain, palpitations and leg swelling.  Gastrointestinal: Negative for heartburn, abdominal pain, vomiting, diarrhea or consitpation Genitourinary: Negative for dysuria, urgency, frequency, hematuria Musculoskeletal: Negative for back pain or joint pain Neurological: Negative for dizziness, seizures, syncope, focal weakness,  numbness and headaches.  Hematological: Does not bruise/bleed easily.  Psychiatric/Behavioral: Negative for hallucinations, confusion, dysphoric mood    Tolerating Diet:yes      DRUG ALLERGIES:   Allergies  Allergen Reactions  . Lyrica [Pregabalin] Other (See Comments)    Reaction: made Restless Leg Syndrome worse     VITALS:  Blood pressure (!) 141/82, pulse 86, temperature 98.6 F (37 C), temperature source Axillary, resp. rate (!) 23, height 5\' 3"  (1.6 m), weight 112.3 kg (247 lb 9.2 oz), SpO2 91 %.  PHYSICAL EXAMINATION:  Constitutional: Appears well-developed and well-nourished. No distress. HENT: Normocephalic. . Eyes: Conjunctivae and EOM are normal. PERRLA, no scleral icterus.  Neck: Normal ROM. Neck supple. No JVD. No tracheal deviation. CVS: RRR, S1/S2 +, no murmurs, no gallops, no carotid bruit.  Pulmonary: Effort and breath sounds normal, no stridor, rhonchi, wheezes, rales.  Abdominal: Soft. BS +,  no distension, tenderness, rebound or guarding.  Musculoskeletal: Normal range  of motion. 1+ lower extremity edema and no tenderness.  Neuro: Alert. CN 2-12 grossly intact. No focal deficits. Skin: Skin is warm and dry. No rash noted. Psychiatric: Normal mood and affect.      LABORATORY PANEL:   CBC  Recent Labs Lab 08/02/16 0610  WBC 5.4  HGB 10.6*  HCT 32.6*  PLT 144*   ------------------------------------------------------------------------------------------------------------------  Chemistries   Recent Labs Lab 08/01/16 0149  08/02/16 0610  NA 146*  --  144  K 3.5  < > 3.9  CL 91*  --  87*  CO2 48*  --  45*  GLUCOSE 113*  --  123*  BUN 13  --  17  CREATININE 0.62  --  0.64  CALCIUM 8.8*  --  8.8*  AST 27  --   --   ALT 18  --   --   ALKPHOS 74  --   --   BILITOT 0.8  --   --   < > = values in this interval not displayed. ------------------------------------------------------------------------------------------------------------------  Cardiac Enzymes  Recent Labs Lab 08/01/16 1145 08/01/16 1716 08/01/16 2326  TROPONINI <0.03 <0.03 <0.03   ------------------------------------------------------------------------------------------------------------------  RADIOLOGY:  Ct Head Wo Contrast  Result Date: 08/01/2016 CLINICAL DATA:  Altered mental status.  Hypoxia. EXAM: CT HEAD WITHOUT CONTRAST TECHNIQUE: Contiguous axial images were obtained from the base of the skull through the vertex without intravenous contrast. COMPARISON:  None. FINDINGS: Brain: Age related atrophy. Minimal chronic small vessel ischemia. Remote lacunar infarct versus prominent perivascular space in the left basal ganglia. No hemorrhage, evidence acute infarct, mass effect or midline shift. No hydrocephalus, basilar cisterns are patent. Vascular: Atherosclerosis of skullbase vasculature without hyperdense vessel or abnormal calcification. Skull: No fracture or  focal lesion. Sinuses/Orbits: Fluid in the right and left sphenoid sinus. Fluid level in left maxillary sinus.  Mastoid air cells are well-aerated. Other: None. IMPRESSION: Age related atrophy with minimal chronic small vessel ischemia. No acute intracranial abnormality. Fluid levels in the sphenoid sinuses and left maxillary sinus, may be secondary to recent intubation or sinusitis. Electronically Signed   By: Jeb Levering M.D.   On: 08/01/2016 03:37   Ct Angio Chest Pe W Or Wo Contrast  Result Date: 08/01/2016 CLINICAL DATA:  Acute respiratory failure. EXAM: CT ANGIOGRAPHY CHEST WITH CONTRAST TECHNIQUE: Multidetector CT imaging of the chest was performed using the standard protocol during bolus administration of intravenous contrast. Multiplanar CT image reconstructions and MIPs were obtained to evaluate the vascular anatomy. CONTRAST:  75 mL of Isovue 370 intravenously. COMPARISON:  CT scan of July 13, 2016. FINDINGS: Cardiovascular: Atherosclerosis of thoracic aorta is noted without aneurysm or dissection. There is no definite evidence of pulmonary embolus. No pericardial effusion is noted. Mediastinum/Nodes: No enlarged mediastinal, hilar, or axillary lymph nodes. Thyroid gland, trachea, and esophagus demonstrate no significant findings. Lungs/Pleura: Mild bilateral pleural effusions are noted with adjacent atelectasis or infiltrates. No pneumothorax is noted. Upper Abdomen: No acute abnormality. Musculoskeletal: Status post left shoulder arthroplasty. No acute abnormality seen. Review of the MIP images confirms the above findings. IMPRESSION: No definite evidence of pulmonary embolus. Aortic atherosclerosis. Mild bilateral pleural effusions are noted with adjacent subsegmental atelectasis or infiltrates. Electronically Signed   By: Marijo Conception, M.D.   On: 08/01/2016 16:35   Dg Chest Portable 1 View  Result Date: 08/01/2016 CLINICAL DATA:  Altered mental status and hypoxia EXAM: PORTABLE CHEST 1 VIEW COMPARISON:  Chest radiograph 07/21/2016 FINDINGS: There is unchanged massive cardiomegaly. There dense  bibasilar consolidations with bilateral pleural effusions. There is mild pulmonary edema. No pneumothorax. IMPRESSION: 1. Unchanged massive enlargement of the cardiomediastinal silhouette, indicating pericardial effusion versus cardiomegaly alone. 2. Bibasilar consolidation, likely atelectasis, and bilateral pleural effusions with mild pulmonary edema, consistent with congestive heart failure. Electronically Signed   By: Ulyses Jarred M.D.   On: 08/01/2016 02:07     ASSESSMENT AND PLAN:   71 year old female with history of chronic respiratory failure on 2-3 L of oxygen due to COPD and chronic diastolic heart failure who presents from the skilled nursing facility with altered mental status and CO2 narcosis.  1. Acute hypoxic/hypercarbic respiratory failure in the setting of COPD and CHF exacerbation Patient tolerated BiPAP and now is on high flow nasal cannula. She did not require intubation. Wean high flow to baseline nasal cannula oxygen of 3 L CT chest with results as above  2. Acute on chronic diastolic heart failure with preserved ejection fraction: Continue IV Lasix Monitor I and O and daily weight  [facility] cardiology consultation appreciated  3. Acute COPD exacerbation: Wean IV steroids as there is no wheezing on today's examination and DuoNeb's  4. Essential hypertension: Patient may restart home medications 5. Hypothyroidism: IV Synthroid has been ordered, but this may be changed to oral now  6. Mild hypernatremia: This is resolved  Needs physical therapy consultation for disposition  Management plans discussed with the patient and she is in agreement.  CODE STATUS: FULL  TOTAL TIME TAKING CARE OF THIS PATIENT: 30 minutes.     POSSIBLE D/C 1-2 days, DEPENDING ON CLINICAL CONDITION.   Jaysiah Marchetta M.D on 08/02/2016 at 6:51 AM  Between 7am to 6pm - Pager - [phone] After 6pm go to [email or web address] - password  [email or web address]  [facility]  Office   [phone]  CC: Primary care physician; Adin Hector, MD  Note: This dictation was prepared with Dragon dictation along with smaller phrase technology. Any transcriptional errors that result from this process are unintentional.

## 2016-08-02 NOTE — Progress Notes (Signed)
[facility] Encounter Note  Patient: Hannah Powell / Admit Date: 08/01/2016 / Date of Encounter: 08/02/2016, 8:37 AM   Subjective: Patient breathing much better. No evidence of significant rhythm disturbances by telemetry. Patient has normal troponin without evidence of myocardial infarction. Diastolic dysfunction congestive heart failure improved with Lasix and oxygenation with continued pleural effusion  Review of Systems: Positive for: Shortness of breath cough congestion Negative for: Vision change, hearing change, syncope, dizziness, nausea, vomiting,diarrhea, bloody stool, stomach pain, positive for cough, congestion, negative for diaphoresis, urinary frequency, urinary pain,skin lesions, skin rashes Others previously listed  Objective: Telemetry: Normal sinus rhythm Physical Exam: Blood pressure 124/82, pulse 94, temperature 98.8 F (37.1 C), temperature source Oral, resp. rate 14, height 5\' 3"  (1.6 m), weight 112.3 kg (247 lb 9.2 oz), SpO2 93 %. Body mass index is 43.86 kg/m. General: Well developed, well nourished, in no acute distress. Head: Normocephalic, atraumatic, sclera non-icteric, no xanthomas, nares are without discharge. Neck: No apparent masses Lungs: Normal respirations with diffuse wheezes, no rhonchi, no rales , few crackles basilar decreased breath sounds  Heart: Regular rate and rhythm, normal S1 S2, no murmur, no rub, no gallop, PMI is normal size and placement, carotid upstroke normal without bruit, jugular venous pressure normal Abdomen: Soft, non-tender, non-distended with normoactive bowel sounds. No hepatosplenomegaly. Abdominal aorta is normal size without bruit Extremities: 1+ edema, no clubbing, no cyanosis, no ulcers,  Peripheral: 2+ radial, 2+ femoral, 2+ dorsal pedal pulses Neuro: Alert and oriented. Moves all extremities spontaneously. Psych:  Responds to questions appropriately with a normal affect.   Intake/Output  Summary (Last 24 hours) at 08/02/16 0837 Last data filed at 08/02/16 0600  Gross per 24 hour  Intake              100 ml  Output             2750 ml  Net            -2650 ml    Inpatient Medications:  . budesonide (PULMICORT) nebulizer solution  0.5 mg Nebulization BID  . chlorhexidine  15 mL Mouth Rinse BID  . enoxaparin (LOVENOX) injection  40 mg Subcutaneous Q12H  . furosemide  40 mg Intravenous BID  . ipratropium-albuterol  3 mL Nebulization Q4H  . levothyroxine  200 mcg Oral QAC breakfast  . mouth rinse  15 mL Mouth Rinse q12n4p  . methylPREDNISolone (SOLU-MEDROL) injection  40 mg Intravenous Q8H  . pramipexole  4 mg Oral QHS   Infusions:  . piperacillin-tazobactam (ZOSYN)  IV 3.375 g (08/02/16 0532)    Labs:  Recent Labs  08/01/16 0149 08/01/16 1716 08/02/16 0610  NA 146*  --  144  K 3.5 3.0* 3.9  CL 91*  --  87*  CO2 48*  --  45*  GLUCOSE 113*  --  123*  BUN 13  --  17  CREATININE 0.62  --  0.64  CALCIUM 8.8*  --  8.8*    Recent Labs  08/01/16 0149  AST 27  ALT 18  ALKPHOS 74  BILITOT 0.8  PROT 6.3*  ALBUMIN 3.3*    Recent Labs  08/01/16 0149 08/02/16 0610  WBC 5.0 5.4  HGB 12.4 10.6*  HCT 38.6 32.6*  MCV 98.8 95.7  PLT 143* 144*    Recent Labs  08/01/16 0149 08/01/16 1145 08/01/16 1716 08/01/16 2326  TROPONINI <0.03 <0.03 <0.03 <0.03   Invalid input(s): POCBNP No results for input(s): HGBA1C in the last  72 hours.   Weights: Filed Weights   08/01/16 0141 08/01/16 1000 08/02/16 0500  Weight: 114.3 kg (252 lb) 110.4 kg (243 lb 6.2 oz) 112.3 kg (247 lb 9.2 oz)     Radiology/Studies:  Dg Chest 1 View  Result Date: 07/17/2016 CLINICAL DATA:  71 year old female status post thoracentesis. EXAM: CHEST 1 VIEW COMPARISON:  Chest x-ray 07/17/2016. FINDINGS: An endotracheal tube is in place with tip 3.4 cm above the carina. There is a left-sided internal jugular central venous catheter with tip terminating in the mid superior vena cava. A  nasogastric tube is seen extending into the stomach, however, the tip of the nasogastric tube extends below the lower margin of the image. Orthopedic fixation hardware in the lower cervical spine. Lung volumes are low. No pneumothorax. Previously noted right-sided pleural effusion has significantly decreased in size, nearly completely resolved. Small left pleural effusion. Patchy multifocal airspace consolidation scattered throughout the lungs bilaterally, similar to recent prior examinations, including a nodular appearing area in the right mid lung. No evidence of pulmonary edema. Heart size is mildly enlarged. Upper mediastinal contours are distorted by patient positioning. Aortic atherosclerosis. IMPRESSION: 1. Decreased size of small right pleural effusion following right-sided thoracentesis. No pneumothorax or other immediate complicating features. 2. Persistent small left pleural effusion. 3. Patchy multifocal interstitial and airspace disease throughout the lungs bilaterally, most compatible with multilobar bronchopneumonia. 4. Mild cardiomegaly. 5. Aortic atherosclerosis. Electronically Signed   By: Vinnie Langton M.D.   On: 07/17/2016 15:15   Dg Chest 1 View  Result Date: 07/10/2016 CLINICAL DATA:  Shortness of breath. EXAM: CHEST 1 VIEW COMPARISON:  07/09/2016. FINDINGS: Stable cardiomegaly. Persistent multifocal pulmonary infiltrates particularly the right upper lung. Atelectatic changes right upper lung. Persistent right apical pleural thickening. Small right pleural effusion. Interim improvement of left pleural effusion. Prior cervical spine fusion. Left shoulder replacement. Degenerative changes right shoulder. IMPRESSION: 1. Persistent multifocal pulmonary infiltrates particularly in the right upper lung. Persistent atelectatic changes right upper lung. Persistent right apical pleural thickening. 2. Small right pleural effusion. Interim improvement of left pleural effusion . Electronically  Signed   By: Marcello Moores  Register   On: 07/10/2016 06:54   Dg Abd 1 View  Result Date: 07/12/2016 CLINICAL DATA:  Post endotracheal tube placement, OG tube placement EXAM: ABDOMEN - 1 VIEW COMPARISON:  Portable chest x-ray of 07/10/2016 FINDINGS: The tip of the endotracheal tube is approximately 4.0 cm above the carina. Left IJ central venous line tip overlies the mid lower SVC. A NG tube extends into the stomach. The lungs appear better aerated. Bibasilar opacities remain consistent with atelectasis and possible small effusions. Pleuroparenchymal opacities are unchanged and the right lung apex. Technically the film is overpenetrated making assessment of the left lung difficult. IMPRESSION: 1. Endotracheal tube tip 4.0 cm above the carina. 2. NG tube extends into the stomach. 3. Left central venous line tip overlies the mid lower SVC Electronically Signed   By: Ivar Drape M.D.   On: 07/12/2016 16:03   Ct Head Wo Contrast  Result Date: 08/01/2016 CLINICAL DATA:  Altered mental status.  Hypoxia. EXAM: CT HEAD WITHOUT CONTRAST TECHNIQUE: Contiguous axial images were obtained from the base of the skull through the vertex without intravenous contrast. COMPARISON:  None. FINDINGS: Brain: Age related atrophy. Minimal chronic small vessel ischemia. Remote lacunar infarct versus prominent perivascular space in the left basal ganglia. No hemorrhage, evidence acute infarct, mass effect or midline shift. No hydrocephalus, basilar cisterns are patent. Vascular: Atherosclerosis of skullbase  vasculature without hyperdense vessel or abnormal calcification. Skull: No fracture or focal lesion. Sinuses/Orbits: Fluid in the right and left sphenoid sinus. Fluid level in left maxillary sinus. Mastoid air cells are well-aerated. Other: None. IMPRESSION: Age related atrophy with minimal chronic small vessel ischemia. No acute intracranial abnormality. Fluid levels in the sphenoid sinuses and left maxillary sinus, may be secondary to  recent intubation or sinusitis. Electronically Signed   By: Jeb Levering M.D.   On: 08/01/2016 03:37   Ct Chest W Contrast  Result Date: 07/13/2016 CLINICAL DATA:  Acute kidney injury. Pneumonia and sepsis. Respiratory failure. Ventilator support. EXAM: CT CHEST, ABDOMEN, AND PELVIS WITH CONTRAST TECHNIQUE: Multidetector CT imaging of the chest, abdomen and pelvis was performed following the standard protocol during bolus administration of intravenous contrast. CONTRAST:  136mL ISOVUE-300 IOPAMIDOL (ISOVUE-300) INJECTION 61% COMPARISON:  Recent radiography FINDINGS: CT CHEST FINDINGS Cardiovascular: Aortic atherosclerosis. No aneurysm or dissection. Some coronary artery calcification. Mitral valve region calcification. No pericardial fluid. No definable pulmonary arterial abnormality. Mediastinum/Nodes: Small reactive mediastinal lymph nodes. Lungs/Pleura: Bilateral effusions layering dependently, larger on the right than the left. On the left, there are areas of patchy bronchopneumonia in the left upper lobe. The left lower lobe shows subtotal collapse. The lung not collapsed shows scattered patchy airspace densities. On the right, the upper lobe shows extensive infiltrate in subtotal collapse. The lower lobe shows subtotal collapse and moderate infiltrate. The right middle lobe shows moderate collapse and infiltrate. Musculoskeletal: Ordinary degenerative changes. Old compression fracture T7. Endotracheal tube tip is in the trachea just above the carina. Nasogastric tube enters the abdomen. CT ABDOMEN PELVIS FINDINGS Hepatobiliary: Normal Pancreas: Fatty replacement.  Otherwise normal. Spleen: Normal Adrenals/Urinary Tract: Adrenal glands are normal. No hydronephrosis. Poorly functioning kidneys. Benign appearing 2 cm cyst lateral right kidney. Stomach/Bowel: No acute or significant bowel finding. Vascular/Lymphatic: Aortic atherosclerosis. No aneurysm. IVC is normal. No retroperitoneal mass or adenopathy.  Reproductive: Negative Other: No free fluid or air. Musculoskeletal: Old appearing lower thoracic compression fracture. Extensive chronic degenerative changes throughout the lumbar spine. IMPRESSION: Bilateral effusions layering dependently, larger on the right than the left. Associated pulmonary collapse affecting all lobes to a degree with the exception of the left upper lobe. The portions of the lungs that are not collapsed show patchy airspace filling consistent with bronchopneumonia. No significant intraabdominal finding, other than delayed excretion by the kidneys. Electronically Signed   By: Nelson Chimes M.D.   On: 07/13/2016 18:05   Ct Angio Chest Pe W Or Wo Contrast  Result Date: 08/01/2016 CLINICAL DATA:  Acute respiratory failure. EXAM: CT ANGIOGRAPHY CHEST WITH CONTRAST TECHNIQUE: Multidetector CT imaging of the chest was performed using the standard protocol during bolus administration of intravenous contrast. Multiplanar CT image reconstructions and MIPs were obtained to evaluate the vascular anatomy. CONTRAST:  75 mL of Isovue 370 intravenously. COMPARISON:  CT scan of July 13, 2016. FINDINGS: Cardiovascular: Atherosclerosis of thoracic aorta is noted without aneurysm or dissection. There is no definite evidence of pulmonary embolus. No pericardial effusion is noted. Mediastinum/Nodes: No enlarged mediastinal, hilar, or axillary lymph nodes. Thyroid gland, trachea, and esophagus demonstrate no significant findings. Lungs/Pleura: Mild bilateral pleural effusions are noted with adjacent atelectasis or infiltrates. No pneumothorax is noted. Upper Abdomen: No acute abnormality. Musculoskeletal: Status post left shoulder arthroplasty. No acute abnormality seen. Review of the MIP images confirms the above findings. IMPRESSION: No definite evidence of pulmonary embolus. Aortic atherosclerosis. Mild bilateral pleural effusions are noted with adjacent subsegmental atelectasis or  infiltrates.  Electronically Signed   By: Marijo Conception, M.D.   On: 08/01/2016 16:35   Ct Abdomen Pelvis W Contrast  Result Date: 07/13/2016 CLINICAL DATA:  Acute kidney injury. Pneumonia and sepsis. Respiratory failure. Ventilator support. EXAM: CT CHEST, ABDOMEN, AND PELVIS WITH CONTRAST TECHNIQUE: Multidetector CT imaging of the chest, abdomen and pelvis was performed following the standard protocol during bolus administration of intravenous contrast. CONTRAST:  164mL ISOVUE-300 IOPAMIDOL (ISOVUE-300) INJECTION 61% COMPARISON:  Recent radiography FINDINGS: CT CHEST FINDINGS Cardiovascular: Aortic atherosclerosis. No aneurysm or dissection. Some coronary artery calcification. Mitral valve region calcification. No pericardial fluid. No definable pulmonary arterial abnormality. Mediastinum/Nodes: Small reactive mediastinal lymph nodes. Lungs/Pleura: Bilateral effusions layering dependently, larger on the right than the left. On the left, there are areas of patchy bronchopneumonia in the left upper lobe. The left lower lobe shows subtotal collapse. The lung not collapsed shows scattered patchy airspace densities. On the right, the upper lobe shows extensive infiltrate in subtotal collapse. The lower lobe shows subtotal collapse and moderate infiltrate. The right middle lobe shows moderate collapse and infiltrate. Musculoskeletal: Ordinary degenerative changes. Old compression fracture T7. Endotracheal tube tip is in the trachea just above the carina. Nasogastric tube enters the abdomen. CT ABDOMEN PELVIS FINDINGS Hepatobiliary: Normal Pancreas: Fatty replacement.  Otherwise normal. Spleen: Normal Adrenals/Urinary Tract: Adrenal glands are normal. No hydronephrosis. Poorly functioning kidneys. Benign appearing 2 cm cyst lateral right kidney. Stomach/Bowel: No acute or significant bowel finding. Vascular/Lymphatic: Aortic atherosclerosis. No aneurysm. IVC is normal. No retroperitoneal mass or adenopathy. Reproductive: Negative  Other: No free fluid or air. Musculoskeletal: Old appearing lower thoracic compression fracture. Extensive chronic degenerative changes throughout the lumbar spine. IMPRESSION: Bilateral effusions layering dependently, larger on the right than the left. Associated pulmonary collapse affecting all lobes to a degree with the exception of the left upper lobe. The portions of the lungs that are not collapsed show patchy airspace filling consistent with bronchopneumonia. No significant intraabdominal finding, other than delayed excretion by the kidneys. Electronically Signed   By: Nelson Chimes M.D.   On: 07/13/2016 18:05   Dg Chest Port 1 View  Result Date: 08/02/2016 CLINICAL DATA:  Respiratory failure. EXAM: PORTABLE CHEST 1 VIEW COMPARISON:  Chest x-ray 08/01/2016, 07/21/2016.  CT 08/01/2016. FINDINGS: Cardiomegaly with mild pulmonary vascular prominence, bilateral interstitial prominence and bilateral pleural effusions consistent with CHF. Similar findings noted on prior exam. Low lung volumes with basilar atelectasis. No pneumothorax. Prior cervicothoracic spine fusion. Left shoulder replacement. IMPRESSION: 1. Findings consistent with congestive heart failure with bilateral pulmonary interstitial edema and bilateral pleural effusions. similar findings noted on prior exam. 2. Low lung volumes with basilar atelectasis again noted. Electronically Signed   By: Marcello Moores  Register   On: 08/02/2016 06:19   Dg Chest Portable 1 View  Result Date: 08/01/2016 CLINICAL DATA:  Altered mental status and hypoxia EXAM: PORTABLE CHEST 1 VIEW COMPARISON:  Chest radiograph 07/21/2016 FINDINGS: There is unchanged massive cardiomegaly. There dense bibasilar consolidations with bilateral pleural effusions. There is mild pulmonary edema. No pneumothorax. IMPRESSION: 1. Unchanged massive enlargement of the cardiomediastinal silhouette, indicating pericardial effusion versus cardiomegaly alone. 2. Bibasilar consolidation, likely  atelectasis, and bilateral pleural effusions with mild pulmonary edema, consistent with congestive heart failure. Electronically Signed   By: Ulyses Jarred M.D.   On: 08/01/2016 02:07   Dg Chest Port 1 View  Result Date: 07/21/2016 CLINICAL DATA:  Acute respiratory failure. EXAM: PORTABLE CHEST 1 VIEW COMPARISON:  Radiograph Jul 18, 2016. FINDINGS: Stable  cardiomegaly. Endotracheal and nasogastric tubes have been removed. Left internal jugular catheter is again noted with distal tip in expected position of the SVC. Status post left shoulder arthroplasty. No pneumothorax is noted. Stable right upper lobe opacity is noted concerning for pneumonia. Mild bibasilar subsegmental atelectasis or infiltrates are noted with minimal pleural effusions. Bony thorax is unremarkable. IMPRESSION: Endotracheal and nasogastric tubes have been removed. Stable position of left internal jugular catheter. Stable right upper lobe opacity concerning for possible pneumonia. Stable mild bibasilar subsegmental atelectasis or infiltrates with minimal pleural effusions. Electronically Signed   By: Marijo Conception, M.D.   On: 07/21/2016 07:11   Dg Chest Port 1 View  Result Date: 07/18/2016 CLINICAL DATA:  Respiratory failure. EXAM: PORTABLE CHEST 1 VIEW COMPARISON:  07/17/2016. FINDINGS: Endotracheal tube, NG tube, left IJ line stable position. Stable cardiomegaly. Patchy right upper lobe and bibasilar infiltrates again noted. Small bilateral pleural effusions again noted. No pneumothorax. Cervical spine fusion. Left shoulder replacement. IMPRESSION: 1. Lines and tubes in stable position. 2. Patchy right upper lobe and bibasilar infiltrates and small pleural effusions. No change. 3. Stable cardiomegaly . Electronically Signed   By: Marcello Moores  Register   On: 07/18/2016 06:45   Dg Chest Port 1 View  Result Date: 07/17/2016 CLINICAL DATA:  Respiratory failure. EXAM: PORTABLE CHEST 1 VIEW COMPARISON:  07/15/2016.  07/13/2016.  CT 07/13/2016.  FINDINGS: Endotracheal tube, NG tube, left IJ line stable position. Stable cardiomegaly. Progressive right upper lobe infiltrate and/or edema. Persistent bibasilar infiltrates and/or edema. Small bilateral pleural effusions. No pneumothorax . Left shoulder replacement. IMPRESSION: 1. Lines and tubes in stable position . 2. Progressive right upper lobe infiltrate. Persistent bibasilar pulmonary infiltrates. Small bilateral pleural effusions. 3.  Cardiomegaly. Electronically Signed   By: Marcello Moores  Register   On: 07/17/2016 06:43   Dg Chest Port 1 View  Result Date: 07/15/2016 CLINICAL DATA:  Followup for pulmonary infiltrates. 71 y.o. Former smoker admitted 04/16 with acute on chronic hypercapnic hypoxic respiratory failure secondary to exacerbation of COPD and right sided pneumonia. Intubated 4/26. EXAM: PORTABLE CHEST 1 VIEW COMPARISON:  07/13/2016 FINDINGS: Patchy bilateral airspace opacities, noted throughout the central right lung and in both lung bases, has improved, although significant consolidation persists. The bilateral pleural effusions appear decreased in size. There are no new lung abnormalities. No pneumothorax. The endotracheal tube, left internal jugular central venous line and nasogastric tube are stable. IMPRESSION: Improved lung aeration when compared the prior exam consistent with improving pneumonia and a decrease in bilateral pleural effusions. Electronically Signed   By: Lajean Manes M.D.   On: 07/15/2016 10:14   Dg Chest Port 1 View  Result Date: 07/13/2016 CLINICAL DATA:  71 year old female with acute respiratory failure. EXAM: PORTABLE CHEST 1 VIEW COMPARISON:  Chest radiograph dated 07/12/2016 FINDINGS: Endotracheal tube above the carina in similar positioning. Left IJ central line in stable position. Enteric tube extends into the left hemiabdomen with tip beyond the inferior margin of the image. There is mild cardiomegaly. There is prominence of the central pulmonary arteries likely  representing a degree of underlying pulmonary hypertension. Bibasilar and right upper lobe airspace densities again noted, similar or slightly worsened. Small bilateral pleural effusions. No pneumothorax. Partially visualized lower cervical fusion hardware. Left shoulder hemiarthroplasty. No acute fracture. IMPRESSION: 1. Bibasilar and right upper lobe airspace opacities, similar or slightly worsened compared to the prior radiograph. 2. Small bilateral pleural effusions similar to prior radiograph. Electronically Signed   By: Laren Everts.D.  On: 07/13/2016 06:52   Dg Chest Port 1 View  Result Date: 07/12/2016 CLINICAL DATA:  Endotracheal tube placement. EXAM: PORTABLE CHEST 1 VIEW COMPARISON:  Chest radiograph July 10, 2016 FINDINGS: Endotracheal tube tip projects 3.5 cm above the carina. LEFT internal jugular central venous catheter distal tip projects in proximal superior vena cava. Nasogastric tube side port projecting in proximal stomach, distal tip out of field-of-view. Increasing bibasilar airspace opacities, obscuring the hemidiaphragms. Cardiac silhouette is mildly enlarged, mildly calcified aortic knob. Improved RIGHT upper lobe consolidation with persistent interstitial prominence. Small pleural effusions. No pneumothorax. LEFT humeral arthroplasty, osseous structures unchanged. ACDF. IMPRESSION: Endotracheal tube tip projects 3.5 cm above the carina. LEFT internal jugular central venous catheter distal tip projects in proximal superior vena cava. Nasogastric tube side port projecting in proximal stomach, distal tip out of field-of-view. Worsening bibasilar consolidation with improved aeration of RIGHT upper lobe, findings suggest shifting pulmonary edema, pneumonia is possible. Small pleural effusions. Electronically Signed   By: Elon Alas M.D.   On: 07/12/2016 16:02   Dg Chest Port 1 View  Result Date: 07/09/2016 CLINICAL DATA:  Respiratory failure. EXAM: PORTABLE CHEST 1 VIEW  COMPARISON:  07/07/2016 FINDINGS: Left shoulder arthroplasty and lower cervical spine fixation. Mildly degraded exam due to AP portable technique and patient body habitus. New or increased right and persistent small left pleural effusion. No pneumothorax. Mild interstitial edema persists. Worsened right upper lobe airspace disease. Increased bibasilar airspace opacities. IMPRESSION: Overall worsened aeration, with increased interstitial edema and multifocal airspace opacities. Persistent left and new or increased layering right pleural effusion. Electronically Signed   By: Abigail Miyamoto M.D.   On: 07/09/2016 07:28   Dg Chest Port 1 View  Result Date: 07/07/2016 CLINICAL DATA:  Initial evaluation for acute respiratory failure. EXAM: PORTABLE CHEST 1 VIEW COMPARISON:  Prior radiograph from 07/06/2016. FINDINGS: Stable cardiomegaly.  Mediastinal silhouette within normal limits. Lungs mildly hypoinflated. Previously noted right upper lobe infiltrate again seen, improved from previous. Improved underlying vascular congestion as compared to prior. Increased density at the left lung base may reflect atelectasis or infiltrate. Possible small left pleural effusion. No pneumothorax. No acute osseus abnormality. Cervical ACDF and left shoulder arthroplasty noted. Degenerative changes noted at the right shoulder. IMPRESSION: 1. Persistent but improved right upper lobe infiltrate, suggesting improved pneumonia. 2. Improved underlying pulmonary vascular congestion as compared to 07/06/16. 3. Increased density at the left lung base, which may reflect atelectasis or infiltrate. Possible small left pleural effusion may be present. Electronically Signed   By: Jeannine Boga M.D.   On: 07/07/2016 03:43   Dg Chest Port 1 View  Result Date: 07/06/2016 CLINICAL DATA:  Pneumonia. EXAM: PORTABLE CHEST 1 VIEW COMPARISON:  Radiograph of July 04, 2016. FINDINGS: Stable cardiomegaly. No pneumothorax is noted. Increased right upper  lobe opacity is noted consistent with pneumonia. Mild bibasilar subsegmental atelectasis or infiltrates are noted. Probable minimal bilateral pleural effusions are noted. Status post left shoulder arthroplasty. Degenerative joint disease is seen involving the right glenohumeral joint. IMPRESSION: Increased right upper lobe opacity is noted consistent with pneumonia. Mild bibasilar subsegmental atelectasis or infiltrates are noted. Electronically Signed   By: Marijo Conception, M.D.   On: 07/06/2016 10:21   Dg Chest Port 1 View  Result Date: 07/04/2016 CLINICAL DATA:  Respiratory failure EXAM: PORTABLE CHEST 1 VIEW COMPARISON:  07/02/2016 FINDINGS: Cardiac shadow is again mildly enlarged but stable. Diffuse right upper lobe infiltrate is seen unchanged from the prior exam. Left lung remains  clear. No new bony abnormality is noted. Postsurgical changes in left shoulder are noted. Postsurgical changes are noted in the cervical spine. Loose screws are noted inferiorly but stable from multiple previous exams. IMPRESSION: Stable right upper lobe infiltrate. Electronically Signed   By: Inez Catalina M.D.   On: 07/04/2016 07:33   Dg Chest Port 1v Same Day  Result Date: 07/17/2016 CLINICAL DATA:  Pleural effusion EXAM: PORTABLE CHEST 1 VIEW COMPARISON:  07/17/2016 FINDINGS: Cardiac shadow is enlarged but stable. Endotracheal tube, nasogastric catheter and left jugular central line are again seen and stable. Improved aeration is noted bilaterally. Some persistent rounded density is noted in the right mid lung likely related to fluid in the minor fissure. Small pleural effusion is noted. Mild interstitial changes are seen. IMPRESSION: Significantly improved aeration when compared with the prior exam in the right lung. Fluid in the minor fissure remains. Tubes and lines as described. Electronically Signed   By: Inez Catalina M.D.   On: 07/17/2016 11:19   US Thoracentesis Asp Pleural Space W/img Guide  Result Date:  07/17/2016 INDICATION: Respiratory failure, ventilatory support, right pleural effusion EXAM: ULTRASOUND GUIDED RIGHT THORACENTESIS MEDICATIONS: 1% lidocaine locally COMPLICATIONS: None immediate. PROCEDURE: An ultrasound guided thoracentesis was thoroughly discussed with the patient and questions answered. The benefits, risks, alternatives and complications were also discussed. The patient understands and wishes to proceed with the procedure. Written consent was obtained. Ultrasound was performed to localize and mark an adequate pocket of fluid in the right chest. The area was then prepped and draped in the normal sterile fashion. 1% Lidocaine was used for local anesthesia. Under ultrasound guidance a 6 Fr Safe-T-Centesis catheter was introduced. Thoracentesis was performed. The catheter was removed and a dressing applied. FINDINGS: A total of approximately 500 cc of amber colored pleural fluid was removed. Samples were sent to the laboratory as requested by the clinical team. IMPRESSION: Successful ultrasound guided right thoracentesis yielding 500 cc of pleural fluid. Electronically Signed   By: Jerilynn Mages.  Shick M.D.   On: 07/17/2016 14:34     Assessment and Recommendation  71 y.o. female with the known chronic obstructive pulmonary disease sleep apnea with acute on chronic exacerbation of COPD causing hypoxia and respiratory failure with acute on chronic diastolic dysfunction heart failure without evidence of myocardial infarction now improving  I1. Continue supportive care of respiratory failure and bronchitis with antibiotics and inhalers and oxygen 2. Furosemide for continued pleural effusions and pulmonary edema now resolving 3. No further cardiac diagnostics necessary at this time due to no evidence of myocardial infarction 4. Begin ambulation and follow for improvements of symptoms and need for adjustments of medication management  Signed, Serafina Royals M.D. FACC

## 2016-08-02 NOTE — Progress Notes (Signed)
Pharmacy Antibiotic Note  Hannah Powell is a 71 y.o. female with a h/o COPD and CHF admitted on 08/01/2016 with acute respiratory failure.  Pharmacy has been consulted for Zosyn dosing for possible aspiration PNA.  Plan: Zosyn 3.375g IV q8h (4 hour infusion).   PCT < 0.1 x 2  Height: 5\' 3"  (160 cm) Weight: 247 lb 9.2 oz (112.3 kg) IBW/kg (Calculated) : 52.4  Temp (24hrs), Avg:98.8 F (37.1 C), Min:98.6 F (37 C), Max:99.1 F (37.3 C)   Recent Labs Lab 08/01/16 0149 08/02/16 0610  WBC 5.0 5.4  CREATININE 0.62 0.64    Estimated Creatinine Clearance: 80 mL/min (by C-G formula based on SCr of 0.64 mg/dL).    Allergies  Allergen Reactions  . Lyrica [Pregabalin] Other (See Comments)    Reaction: made Restless Leg Syndrome worse     Antimicrobials this admission: Zosyn 5/16 >>  Dose adjustments this admission:   Microbiology results: 5/16 BCx: NGTD 5/16 UCx: NG 5/16 MRSA PCR: negative  Thank you for allowing pharmacy to be a part of this patient's care.  [name] 08/02/2016 4:53 PM

## 2016-08-03 LAB — MAGNESIUM: MAGNESIUM: 1.8 mg/dL (ref 1.7–2.4)

## 2016-08-03 LAB — BASIC METABOLIC PANEL
Anion gap: 9 (ref 5–15)
BUN: 17 mg/dL (ref 6–20)
CALCIUM: 8.7 mg/dL — AB (ref 8.9–10.3)
CHLORIDE: 82 mmol/L — AB (ref 101–111)
CO2: 48 mmol/L — ABNORMAL HIGH (ref 22–32)
Creatinine, Ser: 0.74 mg/dL (ref 0.44–1.00)
Glucose, Bld: 148 mg/dL — ABNORMAL HIGH (ref 65–99)
Potassium: 2.9 mmol/L — ABNORMAL LOW (ref 3.5–5.1)
Sodium: 139 mmol/L (ref 135–145)

## 2016-08-03 LAB — CBC
HCT: 33.2 % — ABNORMAL LOW (ref 35.0–47.0)
HEMOGLOBIN: 11 g/dL — AB (ref 12.0–16.0)
MCH: 31.2 pg (ref 26.0–34.0)
MCHC: 33.1 g/dL (ref 32.0–36.0)
MCV: 94.3 fL (ref 80.0–100.0)
PLATELETS: 143 10*3/uL — AB (ref 150–440)
RBC: 3.52 MIL/uL — ABNORMAL LOW (ref 3.80–5.20)
RDW: 17.4 % — AB (ref 11.5–14.5)
WBC: 5.9 10*3/uL (ref 3.6–11.0)

## 2016-08-03 LAB — PROCALCITONIN

## 2016-08-03 MED ORDER — BENZONATATE 100 MG PO CAPS
100.0000 mg | ORAL_CAPSULE | Freq: Three times a day (TID) | ORAL | Status: DC | PRN
  Administered 2016-08-04: 14:00:00 100 mg via ORAL
  Filled 2016-08-03 (×2): qty 1

## 2016-08-03 MED ORDER — LEVOFLOXACIN 750 MG PO TABS: 750.0000 mg | ORAL_TABLET | Freq: Every day | ORAL | 0 refills | Status: DC

## 2016-08-03 MED ORDER — GUAIFENESIN 100 MG/5ML PO SOLN
5.0000 mL | ORAL | Status: DC | PRN
  Filled 2016-08-03: qty 10

## 2016-08-03 MED ORDER — IPRATROPIUM-ALBUTEROL 0.5-2.5 (3) MG/3ML IN SOLN: 3.0000 mL | RESPIRATORY_TRACT | Status: AC | PRN

## 2016-08-03 MED ORDER — HYDROCOD POLST-CPM POLST ER 10-8 MG/5ML PO SUER
5.0000 mL | Freq: Two times a day (BID) | ORAL | Status: DC | PRN
  Administered 2016-08-03 – 2016-08-05 (×3): 5 mL via ORAL
  Filled 2016-08-03 (×3): qty 5

## 2016-08-03 MED ORDER — TRAMADOL HCL 50 MG PO TABS: 50.0000 mg | ORAL_TABLET | Freq: Three times a day (TID) | ORAL | 0 refills | Status: DC | PRN

## 2016-08-03 MED ORDER — MAGNESIUM SULFATE 2 GM/50ML IV SOLN
2.0000 g | Freq: Once | INTRAVENOUS | Status: AC
  Administered 2016-08-03: 2 g via INTRAVENOUS
  Filled 2016-08-03: qty 50

## 2016-08-03 MED ORDER — ALPRAZOLAM 0.25 MG PO TABS: 0.2500 mg | ORAL_TABLET | Freq: Three times a day (TID) | ORAL | 0 refills | Status: AC | PRN

## 2016-08-03 MED ORDER — DEXTROMETHORPHAN POLISTIREX ER 30 MG/5ML PO SUER
30.0000 mg | Freq: Two times a day (BID) | ORAL | Status: DC | PRN
  Administered 2016-08-03: 30 mg via ORAL
  Filled 2016-08-03 (×2): qty 5

## 2016-08-03 MED ORDER — POTASSIUM CHLORIDE CRYS ER 20 MEQ PO TBCR
20.0000 meq | EXTENDED_RELEASE_TABLET | Freq: Three times a day (TID) | ORAL | Status: DC
  Filled 2016-08-03 (×2): qty 1

## 2016-08-03 MED ORDER — POTASSIUM CHLORIDE CRYS ER 20 MEQ PO TBCR
40.0000 meq | EXTENDED_RELEASE_TABLET | ORAL | Status: AC
  Administered 2016-08-03 (×2): 40 meq via ORAL
  Filled 2016-08-03: qty 2

## 2016-08-03 MED ORDER — PREDNISONE 50 MG PO TABS: 50.0000 mg | ORAL_TABLET | Freq: Every day | ORAL | 0 refills | Status: AC

## 2016-08-03 MED ORDER — METHYLPREDNISOLONE SODIUM SUCC 40 MG IJ SOLR: 40.0000 mg | Freq: Every day | INTRAMUSCULAR | Status: DC

## 2016-08-03 MED ORDER — FUROSEMIDE 20 MG PO TABS: 40.0000 mg | ORAL_TABLET | Freq: Two times a day (BID) | ORAL | Status: DC

## 2016-08-03 MED ORDER — FUROSEMIDE 40 MG PO TABS
40.0000 mg | ORAL_TABLET | Freq: Two times a day (BID) | ORAL | Status: DC
  Administered 2016-08-04: 08:00:00 40 mg via ORAL
  Filled 2016-08-03: qty 1

## 2016-08-03 NOTE — Plan of Care (Signed)
Problem: Activity: Goal: Risk for activity intolerance will decrease Outcome: Progressing Pt has been up and down to bedside commode multiple times today and sat in chair twice.  Requires 2+ assist

## 2016-08-03 NOTE — Discharge Summary (Addendum)
[facility] NAME: Hannah Powell    MR#:  [id number]  DATE OF BIRTH:  01/04/1946  DATE OF ADMISSION:  08/01/2016 ADMITTING PHYSICIAN: Bettey Costa, MD  DATE OF DISCHARGE: 08/06/2016  PRIMARY CARE PHYSICIAN: Adin Hector, MD    ADMISSION DIAGNOSIS:  Hypoxia [R09.02] CO2 narcosis [R06.89] Altered mental status, unspecified altered mental status type [R41.82]  DISCHARGE DIAGNOSIS:  Active Problems:   Respiratory failure ([place])   SECONDARY DIAGNOSIS:   Past Medical History:  Diagnosis Date  . Anxiety   . Arthritis   . CHF (congestive heart failure) ([place])   . COPD (chronic obstructive pulmonary disease) ([place])   . Hypertension    dr Otho Najjar     . Hypothyroidism   . RLS (restless legs syndrome)   . Shortness of breath   . Sleep apnea    cpap      >5 yrs    HOSPITAL COURSE:   71 year old female with history of chronic respiratory failure on 2-3 L of oxygen due to COPD and chronic diastolic heart failure who presents from the skilled nursing facility with altered mental status and CO2 narcosis.  1. Acute hypoxic/hypercarbic respiratory failure in the setting of COPD and CHF exacerbation She was initiated on BiPAP and then high flow. She is now on her baseline nasal cannula at 3 L. She has no complaints of shortness of breath. CT of the chest did not show evidence of pulmonary emboli.  2. Acute on chronic diastolic heart failure with preserved ejection fraction: Patient has diuresed using IV Lasix/Diamox. She will be referred to CHF clinic. She will also need monitoring of her BMP. She will continue with potassium supplementation. She will be discharged on by mouth Lasix. [facility] cardiology consultation appreciated  3. Acute COPD exacerbation, mild: Patient has no wheezing on examination. She will be discharged with prednisone and Levaquin.  4. Essential hypertension: Patient will resume outpatient medications 5.  Hypothyroidism: She will continue Synthroid  6. Mild hypernatremia: This has resolved  7. OSA; she wears CPAP at night  8. Hypokalemia: This was repleted  DISCHARGE CONDITIONS AND DIET:   Stable Heart healthy  CONSULTS OBTAINED:  Treatment Team:  Laverle Hobby, MD Corey Skains, MD  DRUG ALLERGIES:   Allergies  Allergen Reactions  . Lyrica [Pregabalin] Other (See Comments)    Reaction: made Restless Leg Syndrome worse     DISCHARGE MEDICATIONS:   Current Discharge Medication List    START taking these medications   Details  guaiFENesin (ROBITUSSIN) 100 MG/5ML SOLN Take 5 mLs (100 mg total) by mouth every 4 (four) hours as needed for cough or to loosen phlegm. Qty: 1200 mL, Refills: 0    levofloxacin (LEVAQUIN) 750 MG tablet Take 1 tablet (750 mg total) by mouth daily. Qty: 5 tablet, Refills: 0    predniSONE (DELTASONE) 50 MG tablet Take 1 tablet (50 mg total) by mouth daily with breakfast. Qty: 4 tablet, Refills: 0      CONTINUE these medications which have CHANGED   Details  ALPRAZolam (XANAX) 0.25 MG tablet Take 1 tablet (0.25 mg total) by mouth 3 (three) times daily as needed for anxiety. Qty: 30 tablet, Refills: 0    furosemide (LASIX) 20 MG tablet Take 2 tablets (40 mg total) by mouth 2 (two) times daily.    ipratropium-albuterol (DUONEB) 0.5-2.5 (3) MG/3ML SOLN Take 3 mLs by nebulization every 4 (four) hours as needed.    traMADol (ULTRAM) 50  MG tablet Take 1 tablet (50 mg total) by mouth every 8 (eight) hours as needed. Qty: 30 tablet, Refills: 0      CONTINUE these medications which have NOT CHANGED   Details  aspirin EC 81 MG tablet Take 81 mg by mouth daily.    B Complex-C (B-COMPLEX WITH VITAMIN C) tablet Take 1 tablet by mouth daily.    Calcium Carbonate-Vitamin D (CALCIUM-VITAMIN D) 500-200 MG-UNIT per tablet Take 1 tablet by mouth daily.    cyclobenzaprine (FLEXERIL) 10 MG tablet Take 0.5-1 mg by mouth 3 (three) times daily as  needed for muscle spasms.    ferrous sulfate 325 (65 FE) MG tablet Take 325 mg by mouth daily with breakfast.    gabapentin (NEURONTIN) 300 MG capsule Take 300 mg by mouth 2 (two) times daily.    levothyroxine (SYNTHROID, LEVOTHROID) 200 MCG tablet Take 200 mcg by mouth daily before breakfast.    losartan (COZAAR) 25 MG tablet Take 25 mg by mouth daily.    nystatin (NYSTATIN) powder Apply 1 g topically 2 (two) times daily.    Omega-3 Fatty Acids (OMEGA 3 PO) Take 1 capsule by mouth daily.    oxybutynin (DITROPAN) 5 MG tablet Take 5 mg by mouth 3 (three) times daily.    potassium chloride SA (K-DUR,KLOR-CON) 20 MEQ tablet Take 20 mEq by mouth 2 (two) times daily.    pramipexole (MIRAPEX) 1 MG tablet Take 4 mg by mouth at bedtime.     roflumilast (DALIRESP) 500 MCG TABS tablet Take 1 tablet (500 mcg total) by mouth daily. Qty: 90 tablet, Refills: 3    tiotropium (SPIRIVA) 18 MCG inhalation capsule Place 18 mcg into inhaler and inhale daily.    zolpidem (AMBIEN) 10 MG tablet Take 5 mg by mouth at bedtime as needed for sleep.      STOP taking these medications     azelastine (ASTELIN) 0.1 % nasal spray      ibuprofen (ADVIL,MOTRIN) 200 MG tablet           Today   CHIEF COMPLAINT:  Doing very well this am no SOB or CP   VITAL SIGNS:  Blood pressure (!) 153/76, pulse 81, temperature 98 F (36.7 C), resp. rate 19, height 5\' 5"  (1.651 m), weight 109.9 kg (242 lb 3.2 oz), SpO2 98 %.   REVIEW OF SYSTEMS:  Review of Systems  Constitutional: Negative.  Negative for chills, fever and malaise/fatigue.  HENT: Negative.  Negative for ear discharge, ear pain, hearing loss, nosebleeds and sore throat.   Eyes: Negative.  Negative for blurred vision and pain.  Respiratory: Negative.  Negative for cough, hemoptysis, shortness of breath and wheezing.   Cardiovascular: Negative.  Negative for chest pain, palpitations and leg swelling.  Gastrointestinal: Negative.  Negative for  abdominal pain, blood in stool, diarrhea, nausea and vomiting.  Genitourinary: Negative.  Negative for dysuria.  Musculoskeletal: Negative.  Negative for back pain.  Skin: Negative.   Neurological: Negative for dizziness, tremors, speech change, focal weakness, seizures and headaches.  Endo/Heme/Allergies: Negative.  Does not bruise/bleed easily.  Psychiatric/Behavioral: Negative.  Negative for depression, hallucinations and suicidal ideas.     PHYSICAL EXAMINATION:  GENERAL:  71 y.o.-year-old patient lying in the bed with no acute distress.  NECK:  Supple, no jugular venous distention. No thyroid enlargement, no tenderness.  LUNGS: Normal breath sounds bilaterally, no wheezing, rales,rhonchi  No use of accessory muscles of respiration.  CARDIOVASCULAR: S1, S2 normal. No murmurs, rubs, or gallops.  ABDOMEN:  Soft, non-tender, non-distended. Bowel sounds present. No organomegaly or mass.  EXTREMITIES: No pedal edema, cyanosis, or clubbing.  PSYCHIATRIC: The patient is alert and oriented x 3.  SKIN: No obvious rash, lesion, or ulcer.   DATA REVIEW:   CBC  Recent Labs Lab 08/03/16 0415  WBC 5.9  HGB 11.0*  HCT 33.2*  PLT 143*    Chemistries   Recent Labs Lab 08/01/16 0149  08/05/16 0520 08/06/16 0415  NA 146*  < > 140 140  K 3.5  < > 3.2* 3.7  CL 91*  < > 91* 101  CO2 48*  < > 43* 36*  GLUCOSE 113*  < > 104* 104*  BUN 13  < > 18 20  CREATININE 0.62  < > 0.68 0.51  CALCIUM 8.8*  < > 8.5* 8.3*  MG  --   < > 2.4  --   AST 27  --   --   --   ALT 18  --   --   --   ALKPHOS 74  --   --   --   BILITOT 0.8  --   --   --   < > = values in this interval not displayed.  Cardiac Enzymes  Recent Labs Lab 08/01/16 1145 08/01/16 1716 08/01/16 2326  TROPONINI <0.03 <0.03 <0.03    Microbiology Results  @MICRORSLT48 @  RADIOLOGY:  No results found.    Current Discharge Medication List    START taking these medications   Details  guaiFENesin (ROBITUSSIN) 100  MG/5ML SOLN Take 5 mLs (100 mg total) by mouth every 4 (four) hours as needed for cough or to loosen phlegm. Qty: 1200 mL, Refills: 0    levofloxacin (LEVAQUIN) 750 MG tablet Take 1 tablet (750 mg total) by mouth daily. Qty: 5 tablet, Refills: 0    predniSONE (DELTASONE) 50 MG tablet Take 1 tablet (50 mg total) by mouth daily with breakfast. Qty: 4 tablet, Refills: 0      CONTINUE these medications which have CHANGED   Details  ALPRAZolam (XANAX) 0.25 MG tablet Take 1 tablet (0.25 mg total) by mouth 3 (three) times daily as needed for anxiety. Qty: 30 tablet, Refills: 0    furosemide (LASIX) 20 MG tablet Take 2 tablets (40 mg total) by mouth 2 (two) times daily.    ipratropium-albuterol (DUONEB) 0.5-2.5 (3) MG/3ML SOLN Take 3 mLs by nebulization every 4 (four) hours as needed.    traMADol (ULTRAM) 50 MG tablet Take 1 tablet (50 mg total) by mouth every 8 (eight) hours as needed. Qty: 30 tablet, Refills: 0      CONTINUE these medications which have NOT CHANGED   Details  aspirin EC 81 MG tablet Take 81 mg by mouth daily.    B Complex-C (B-COMPLEX WITH VITAMIN C) tablet Take 1 tablet by mouth daily.    Calcium Carbonate-Vitamin D (CALCIUM-VITAMIN D) 500-200 MG-UNIT per tablet Take 1 tablet by mouth daily.    cyclobenzaprine (FLEXERIL) 10 MG tablet Take 0.5-1 mg by mouth 3 (three) times daily as needed for muscle spasms.    ferrous sulfate 325 (65 FE) MG tablet Take 325 mg by mouth daily with breakfast.    gabapentin (NEURONTIN) 300 MG capsule Take 300 mg by mouth 2 (two) times daily.    levothyroxine (SYNTHROID, LEVOTHROID) 200 MCG tablet Take 200 mcg by mouth daily before breakfast.    losartan (COZAAR) 25 MG tablet Take 25 mg by mouth daily.    nystatin (NYSTATIN) powder  Apply 1 g topically 2 (two) times daily.    Omega-3 Fatty Acids (OMEGA 3 PO) Take 1 capsule by mouth daily.    oxybutynin (DITROPAN) 5 MG tablet Take 5 mg by mouth 3 (three) times daily.    potassium  chloride SA (K-DUR,KLOR-CON) 20 MEQ tablet Take 20 mEq by mouth 2 (two) times daily.    pramipexole (MIRAPEX) 1 MG tablet Take 4 mg by mouth at bedtime.     roflumilast (DALIRESP) 500 MCG TABS tablet Take 1 tablet (500 mcg total) by mouth daily. Qty: 90 tablet, Refills: 3    tiotropium (SPIRIVA) 18 MCG inhalation capsule Place 18 mcg into inhaler and inhale daily.    zolpidem (AMBIEN) 10 MG tablet Take 5 mg by mouth at bedtime as needed for sleep.      STOP taking these medications     azelastine (ASTELIN) 0.1 % nasal spray      ibuprofen (ADVIL,MOTRIN) 200 MG tablet             Management plans discussed with the patient and she is in agreement. Stable for discharge snf  Patient should follow up with pcp  CODE STATUS:     Code Status Orders        Start     Ordered   08/01/16 1112  Full code  Continuous     08/01/16 1111    Code Status History    Date Active Date Inactive Code Status Order ID Comments User Context   07/02/2016 12:44 PM 07/24/2016  6:15 PM DNR [id number]  Loletha Grayer, MD ED   07/18/2015  1:24 PM 07/22/2015  3:51 PM Full Code [id number]  Flora Lipps, MD ED   07/02/2013 11:24 AM 07/03/2013  1:35 PM Full Code [id number]  Jenetta Loges, PA-C Inpatient    Advance Directive Documentation     Most Recent Value  Type of Advance Directive  Out of facility DNR (pink MOST or yellow form)  Pre-existing out of facility DNR order (yellow form or pink MOST form)  Pink MOST form placed in chart (order not valid for inpatient use)  "MOST" Form in Place?  -      TOTAL TIME TAKING CARE OF THIS PATIENT: 38 minutes.    Note: This dictation was prepared with Dragon dictation along with smaller phrase technology. Any transcriptional errors that result from this process are unintentional.  Darneisha Windhorst M.D on 08/06/2016 at 9:03 AM  Between 7am to 6pm - Pager - [phone] After 6pm go to [email or web address] - password [email or web address] [facility]  Office   [phone]  CC: Primary care physician; Adin Hector, MD

## 2016-08-03 NOTE — Progress Notes (Signed)
[facility] Encounter Note  Patient: Hannah Powell / Admit Date: 08/01/2016 / Date of Encounter: 08/03/2016, 8:50 AM   Subjective: Patient breathing much better. No evidence of significant rhythm disturbances by telemetry. Patient has normal troponin without evidence of myocardial infarction. Diastolic dysfunction congestive heart failure improved with Lasix and oxygenation with continued pleural effusion. Overall improved from admission  Review of Systems: Positive for: Shortness of breath cough congestion Negative for: Vision change, hearing change, syncope, dizziness, nausea, vomiting,diarrhea, bloody stool, stomach pain, positive for cough, congestion, negative for diaphoresis, urinary frequency, urinary pain,skin lesions, skin rashes Others previously listed  Objective: Telemetry: Normal sinus rhythm Physical Exam: Blood pressure (!) 158/90, pulse 77, temperature 98.3 F (36.8 C), temperature source Oral, resp. rate (!) 21, height 5\' 3"  (1.6 m), weight 112.3 kg (247 lb 9.2 oz), SpO2 100 %. Body mass index is 43.86 kg/m. General: Well developed, well nourished, in no acute distress. Head: Normocephalic, atraumatic, sclera non-icteric, no xanthomas, nares are without discharge. Neck: No apparent masses Lungs: Normal respirations with diffuse wheezes, no rhonchi, no rales , few crackles basilar decreased breath sounds  Heart: Regular rate and rhythm, normal S1 S2, no murmur, no rub, no gallop, PMI is normal size and placement, carotid upstroke normal without bruit, jugular venous pressure normal Abdomen: Soft, non-tender, non-distended with normoactive bowel sounds. No hepatosplenomegaly. Abdominal aorta is normal size without bruit Extremities: 1+ edema, no clubbing, no cyanosis, no ulcers,  Peripheral: 2+ radial, 2+ femoral, 2+ dorsal pedal pulses Neuro: Alert and oriented. Moves all extremities spontaneously. Psych:  Responds to questions appropriately with  a normal affect.   Intake/Output Summary (Last 24 hours) at 08/03/16 0850 Last data filed at 08/02/16 2200  Gross per 24 hour  Intake             1320 ml  Output             2625 ml  Net            -1305 ml    Inpatient Medications:  . budesonide (PULMICORT) nebulizer solution  0.5 mg Nebulization BID  . chlorhexidine  15 mL Mouth Rinse BID  . enoxaparin (LOVENOX) injection  40 mg Subcutaneous Q12H  . furosemide  40 mg Intravenous BID  . ipratropium-albuterol  3 mL Nebulization Q4H  . levothyroxine  200 mcg Oral QAC breakfast  . mouth rinse  15 mL Mouth Rinse q12n4p  . [START ON 08/04/2016] methylPREDNISolone (SOLU-MEDROL) injection  40 mg Intravenous Daily  . potassium chloride  20 mEq Oral TID WC  . pramipexole  4 mg Oral QHS   Infusions:  . piperacillin-tazobactam (ZOSYN)  IV 3.375 g (08/03/16 0530)    Labs:  Recent Labs  08/02/16 0610 08/03/16 0415  NA 144 139  K 3.9 2.9*  CL 87* 82*  CO2 45* 48*  GLUCOSE 123* 148*  BUN 17 17  CREATININE 0.64 0.74  CALCIUM 8.8* 8.7*  MG  --  1.8    Recent Labs  08/01/16 0149  AST 27  ALT 18  ALKPHOS 74  BILITOT 0.8  PROT 6.3*  ALBUMIN 3.3*    Recent Labs  08/02/16 0610 08/03/16 0415  WBC 5.4 5.9  HGB 10.6* 11.0*  HCT 32.6* 33.2*  MCV 95.7 94.3  PLT 144* 143*    Recent Labs  08/01/16 0149 08/01/16 1145 08/01/16 1716 08/01/16 2326  TROPONINI <0.03 <0.03 <0.03 <0.03   Invalid input(s): POCBNP No results for input(s): HGBA1C in the last  72 hours.   Weights: Filed Weights   08/01/16 0141 08/01/16 1000 08/02/16 0500  Weight: 114.3 kg (252 lb) 110.4 kg (243 lb 6.2 oz) 112.3 kg (247 lb 9.2 oz)     Radiology/Studies:  Dg Chest 1 View  Result Date: 07/17/2016 CLINICAL DATA:  71 year old female status post thoracentesis. EXAM: CHEST 1 VIEW COMPARISON:  Chest x-ray 07/17/2016. FINDINGS: An endotracheal tube is in place with tip 3.4 cm above the carina. There is a left-sided internal jugular central venous  catheter with tip terminating in the mid superior vena cava. A nasogastric tube is seen extending into the stomach, however, the tip of the nasogastric tube extends below the lower margin of the image. Orthopedic fixation hardware in the lower cervical spine. Lung volumes are low. No pneumothorax. Previously noted right-sided pleural effusion has significantly decreased in size, nearly completely resolved. Small left pleural effusion. Patchy multifocal airspace consolidation scattered throughout the lungs bilaterally, similar to recent prior examinations, including a nodular appearing area in the right mid lung. No evidence of pulmonary edema. Heart size is mildly enlarged. Upper mediastinal contours are distorted by patient positioning. Aortic atherosclerosis. IMPRESSION: 1. Decreased size of small right pleural effusion following right-sided thoracentesis. No pneumothorax or other immediate complicating features. 2. Persistent small left pleural effusion. 3. Patchy multifocal interstitial and airspace disease throughout the lungs bilaterally, most compatible with multilobar bronchopneumonia. 4. Mild cardiomegaly. 5. Aortic atherosclerosis. Electronically Signed   By: Vinnie Langton M.D.   On: 07/17/2016 15:15   Dg Chest 1 View  Result Date: 07/10/2016 CLINICAL DATA:  Shortness of breath. EXAM: CHEST 1 VIEW COMPARISON:  07/09/2016. FINDINGS: Stable cardiomegaly. Persistent multifocal pulmonary infiltrates particularly the right upper lung. Atelectatic changes right upper lung. Persistent right apical pleural thickening. Small right pleural effusion. Interim improvement of left pleural effusion. Prior cervical spine fusion. Left shoulder replacement. Degenerative changes right shoulder. IMPRESSION: 1. Persistent multifocal pulmonary infiltrates particularly in the right upper lung. Persistent atelectatic changes right upper lung. Persistent right apical pleural thickening. 2. Small right pleural effusion.  Interim improvement of left pleural effusion . Electronically Signed   By: Marcello Moores  Register   On: 07/10/2016 06:54   Dg Abd 1 View  Result Date: 07/12/2016 CLINICAL DATA:  Post endotracheal tube placement, OG tube placement EXAM: ABDOMEN - 1 VIEW COMPARISON:  Portable chest x-ray of 07/10/2016 FINDINGS: The tip of the endotracheal tube is approximately 4.0 cm above the carina. Left IJ central venous line tip overlies the mid lower SVC. A NG tube extends into the stomach. The lungs appear better aerated. Bibasilar opacities remain consistent with atelectasis and possible small effusions. Pleuroparenchymal opacities are unchanged and the right lung apex. Technically the film is overpenetrated making assessment of the left lung difficult. IMPRESSION: 1. Endotracheal tube tip 4.0 cm above the carina. 2. NG tube extends into the stomach. 3. Left central venous line tip overlies the mid lower SVC Electronically Signed   By: Ivar Drape M.D.   On: 07/12/2016 16:03   Ct Head Wo Contrast  Result Date: 08/01/2016 CLINICAL DATA:  Altered mental status.  Hypoxia. EXAM: CT HEAD WITHOUT CONTRAST TECHNIQUE: Contiguous axial images were obtained from the base of the skull through the vertex without intravenous contrast. COMPARISON:  None. FINDINGS: Brain: Age related atrophy. Minimal chronic small vessel ischemia. Remote lacunar infarct versus prominent perivascular space in the left basal ganglia. No hemorrhage, evidence acute infarct, mass effect or midline shift. No hydrocephalus, basilar cisterns are patent. Vascular: Atherosclerosis of skullbase  vasculature without hyperdense vessel or abnormal calcification. Skull: No fracture or focal lesion. Sinuses/Orbits: Fluid in the right and left sphenoid sinus. Fluid level in left maxillary sinus. Mastoid air cells are well-aerated. Other: None. IMPRESSION: Age related atrophy with minimal chronic small vessel ischemia. No acute intracranial abnormality. Fluid levels in the  sphenoid sinuses and left maxillary sinus, may be secondary to recent intubation or sinusitis. Electronically Signed   By: Jeb Levering M.D.   On: 08/01/2016 03:37   Ct Chest W Contrast  Result Date: 07/13/2016 CLINICAL DATA:  Acute kidney injury. Pneumonia and sepsis. Respiratory failure. Ventilator support. EXAM: CT CHEST, ABDOMEN, AND PELVIS WITH CONTRAST TECHNIQUE: Multidetector CT imaging of the chest, abdomen and pelvis was performed following the standard protocol during bolus administration of intravenous contrast. CONTRAST:  166mL ISOVUE-300 IOPAMIDOL (ISOVUE-300) INJECTION 61% COMPARISON:  Recent radiography FINDINGS: CT CHEST FINDINGS Cardiovascular: Aortic atherosclerosis. No aneurysm or dissection. Some coronary artery calcification. Mitral valve region calcification. No pericardial fluid. No definable pulmonary arterial abnormality. Mediastinum/Nodes: Small reactive mediastinal lymph nodes. Lungs/Pleura: Bilateral effusions layering dependently, larger on the right than the left. On the left, there are areas of patchy bronchopneumonia in the left upper lobe. The left lower lobe shows subtotal collapse. The lung not collapsed shows scattered patchy airspace densities. On the right, the upper lobe shows extensive infiltrate in subtotal collapse. The lower lobe shows subtotal collapse and moderate infiltrate. The right middle lobe shows moderate collapse and infiltrate. Musculoskeletal: Ordinary degenerative changes. Old compression fracture T7. Endotracheal tube tip is in the trachea just above the carina. Nasogastric tube enters the abdomen. CT ABDOMEN PELVIS FINDINGS Hepatobiliary: Normal Pancreas: Fatty replacement.  Otherwise normal. Spleen: Normal Adrenals/Urinary Tract: Adrenal glands are normal. No hydronephrosis. Poorly functioning kidneys. Benign appearing 2 cm cyst lateral right kidney. Stomach/Bowel: No acute or significant bowel finding. Vascular/Lymphatic: Aortic atherosclerosis. No  aneurysm. IVC is normal. No retroperitoneal mass or adenopathy. Reproductive: Negative Other: No free fluid or air. Musculoskeletal: Old appearing lower thoracic compression fracture. Extensive chronic degenerative changes throughout the lumbar spine. IMPRESSION: Bilateral effusions layering dependently, larger on the right than the left. Associated pulmonary collapse affecting all lobes to a degree with the exception of the left upper lobe. The portions of the lungs that are not collapsed show patchy airspace filling consistent with bronchopneumonia. No significant intraabdominal finding, other than delayed excretion by the kidneys. Electronically Signed   By: Nelson Chimes M.D.   On: 07/13/2016 18:05   Ct Angio Chest Pe W Or Wo Contrast  Result Date: 08/01/2016 CLINICAL DATA:  Acute respiratory failure. EXAM: CT ANGIOGRAPHY CHEST WITH CONTRAST TECHNIQUE: Multidetector CT imaging of the chest was performed using the standard protocol during bolus administration of intravenous contrast. Multiplanar CT image reconstructions and MIPs were obtained to evaluate the vascular anatomy. CONTRAST:  75 mL of Isovue 370 intravenously. COMPARISON:  CT scan of July 13, 2016. FINDINGS: Cardiovascular: Atherosclerosis of thoracic aorta is noted without aneurysm or dissection. There is no definite evidence of pulmonary embolus. No pericardial effusion is noted. Mediastinum/Nodes: No enlarged mediastinal, hilar, or axillary lymph nodes. Thyroid gland, trachea, and esophagus demonstrate no significant findings. Lungs/Pleura: Mild bilateral pleural effusions are noted with adjacent atelectasis or infiltrates. No pneumothorax is noted. Upper Abdomen: No acute abnormality. Musculoskeletal: Status post left shoulder arthroplasty. No acute abnormality seen. Review of the MIP images confirms the above findings. IMPRESSION: No definite evidence of pulmonary embolus. Aortic atherosclerosis. Mild bilateral pleural effusions are noted with  adjacent subsegmental atelectasis  or infiltrates. Electronically Signed   By: Marijo Conception, M.D.   On: 08/01/2016 16:35   Ct Abdomen Pelvis W Contrast  Result Date: 07/13/2016 CLINICAL DATA:  Acute kidney injury. Pneumonia and sepsis. Respiratory failure. Ventilator support. EXAM: CT CHEST, ABDOMEN, AND PELVIS WITH CONTRAST TECHNIQUE: Multidetector CT imaging of the chest, abdomen and pelvis was performed following the standard protocol during bolus administration of intravenous contrast. CONTRAST:  180mL ISOVUE-300 IOPAMIDOL (ISOVUE-300) INJECTION 61% COMPARISON:  Recent radiography FINDINGS: CT CHEST FINDINGS Cardiovascular: Aortic atherosclerosis. No aneurysm or dissection. Some coronary artery calcification. Mitral valve region calcification. No pericardial fluid. No definable pulmonary arterial abnormality. Mediastinum/Nodes: Small reactive mediastinal lymph nodes. Lungs/Pleura: Bilateral effusions layering dependently, larger on the right than the left. On the left, there are areas of patchy bronchopneumonia in the left upper lobe. The left lower lobe shows subtotal collapse. The lung not collapsed shows scattered patchy airspace densities. On the right, the upper lobe shows extensive infiltrate in subtotal collapse. The lower lobe shows subtotal collapse and moderate infiltrate. The right middle lobe shows moderate collapse and infiltrate. Musculoskeletal: Ordinary degenerative changes. Old compression fracture T7. Endotracheal tube tip is in the trachea just above the carina. Nasogastric tube enters the abdomen. CT ABDOMEN PELVIS FINDINGS Hepatobiliary: Normal Pancreas: Fatty replacement.  Otherwise normal. Spleen: Normal Adrenals/Urinary Tract: Adrenal glands are normal. No hydronephrosis. Poorly functioning kidneys. Benign appearing 2 cm cyst lateral right kidney. Stomach/Bowel: No acute or significant bowel finding. Vascular/Lymphatic: Aortic atherosclerosis. No aneurysm. IVC is normal. No  retroperitoneal mass or adenopathy. Reproductive: Negative Other: No free fluid or air. Musculoskeletal: Old appearing lower thoracic compression fracture. Extensive chronic degenerative changes throughout the lumbar spine. IMPRESSION: Bilateral effusions layering dependently, larger on the right than the left. Associated pulmonary collapse affecting all lobes to a degree with the exception of the left upper lobe. The portions of the lungs that are not collapsed show patchy airspace filling consistent with bronchopneumonia. No significant intraabdominal finding, other than delayed excretion by the kidneys. Electronically Signed   By: Nelson Chimes M.D.   On: 07/13/2016 18:05   Dg Chest Port 1 View  Result Date: 08/02/2016 CLINICAL DATA:  Respiratory failure. EXAM: PORTABLE CHEST 1 VIEW COMPARISON:  Chest x-ray 08/01/2016, 07/21/2016.  CT 08/01/2016. FINDINGS: Cardiomegaly with mild pulmonary vascular prominence, bilateral interstitial prominence and bilateral pleural effusions consistent with CHF. Similar findings noted on prior exam. Low lung volumes with basilar atelectasis. No pneumothorax. Prior cervicothoracic spine fusion. Left shoulder replacement. IMPRESSION: 1. Findings consistent with congestive heart failure with bilateral pulmonary interstitial edema and bilateral pleural effusions. similar findings noted on prior exam. 2. Low lung volumes with basilar atelectasis again noted. Electronically Signed   By: Marcello Moores  Register   On: 08/02/2016 06:19   Dg Chest Portable 1 View  Result Date: 08/01/2016 CLINICAL DATA:  Altered mental status and hypoxia EXAM: PORTABLE CHEST 1 VIEW COMPARISON:  Chest radiograph 07/21/2016 FINDINGS: There is unchanged massive cardiomegaly. There dense bibasilar consolidations with bilateral pleural effusions. There is mild pulmonary edema. No pneumothorax. IMPRESSION: 1. Unchanged massive enlargement of the cardiomediastinal silhouette, indicating pericardial effusion versus  cardiomegaly alone. 2. Bibasilar consolidation, likely atelectasis, and bilateral pleural effusions with mild pulmonary edema, consistent with congestive heart failure. Electronically Signed   By: Ulyses Jarred M.D.   On: 08/01/2016 02:07   Dg Chest Port 1 View  Result Date: 07/21/2016 CLINICAL DATA:  Acute respiratory failure. EXAM: PORTABLE CHEST 1 VIEW COMPARISON:  Radiograph Jul 18, 2016. FINDINGS: Stable  cardiomegaly. Endotracheal and nasogastric tubes have been removed. Left internal jugular catheter is again noted with distal tip in expected position of the SVC. Status post left shoulder arthroplasty. No pneumothorax is noted. Stable right upper lobe opacity is noted concerning for pneumonia. Mild bibasilar subsegmental atelectasis or infiltrates are noted with minimal pleural effusions. Bony thorax is unremarkable. IMPRESSION: Endotracheal and nasogastric tubes have been removed. Stable position of left internal jugular catheter. Stable right upper lobe opacity concerning for possible pneumonia. Stable mild bibasilar subsegmental atelectasis or infiltrates with minimal pleural effusions. Electronically Signed   By: Marijo Conception, M.D.   On: 07/21/2016 07:11   Dg Chest Port 1 View  Result Date: 07/18/2016 CLINICAL DATA:  Respiratory failure. EXAM: PORTABLE CHEST 1 VIEW COMPARISON:  07/17/2016. FINDINGS: Endotracheal tube, NG tube, left IJ line stable position. Stable cardiomegaly. Patchy right upper lobe and bibasilar infiltrates again noted. Small bilateral pleural effusions again noted. No pneumothorax. Cervical spine fusion. Left shoulder replacement. IMPRESSION: 1. Lines and tubes in stable position. 2. Patchy right upper lobe and bibasilar infiltrates and small pleural effusions. No change. 3. Stable cardiomegaly . Electronically Signed   By: Marcello Moores  Register   On: 07/18/2016 06:45   Dg Chest Port 1 View  Result Date: 07/17/2016 CLINICAL DATA:  Respiratory failure. EXAM: PORTABLE CHEST 1 VIEW  COMPARISON:  07/15/2016.  07/13/2016.  CT 07/13/2016. FINDINGS: Endotracheal tube, NG tube, left IJ line stable position. Stable cardiomegaly. Progressive right upper lobe infiltrate and/or edema. Persistent bibasilar infiltrates and/or edema. Small bilateral pleural effusions. No pneumothorax . Left shoulder replacement. IMPRESSION: 1. Lines and tubes in stable position . 2. Progressive right upper lobe infiltrate. Persistent bibasilar pulmonary infiltrates. Small bilateral pleural effusions. 3.  Cardiomegaly. Electronically Signed   By: Marcello Moores  Register   On: 07/17/2016 06:43   Dg Chest Port 1 View  Result Date: 07/15/2016 CLINICAL DATA:  Followup for pulmonary infiltrates. 71 y.o. Former smoker admitted 04/16 with acute on chronic hypercapnic hypoxic respiratory failure secondary to exacerbation of COPD and right sided pneumonia. Intubated 4/26. EXAM: PORTABLE CHEST 1 VIEW COMPARISON:  07/13/2016 FINDINGS: Patchy bilateral airspace opacities, noted throughout the central right lung and in both lung bases, has improved, although significant consolidation persists. The bilateral pleural effusions appear decreased in size. There are no new lung abnormalities. No pneumothorax. The endotracheal tube, left internal jugular central venous line and nasogastric tube are stable. IMPRESSION: Improved lung aeration when compared the prior exam consistent with improving pneumonia and a decrease in bilateral pleural effusions. Electronically Signed   By: Lajean Manes M.D.   On: 07/15/2016 10:14   Dg Chest Port 1 View  Result Date: 07/13/2016 CLINICAL DATA:  71 year old female with acute respiratory failure. EXAM: PORTABLE CHEST 1 VIEW COMPARISON:  Chest radiograph dated 07/12/2016 FINDINGS: Endotracheal tube above the carina in similar positioning. Left IJ central line in stable position. Enteric tube extends into the left hemiabdomen with tip beyond the inferior margin of the image. There is mild cardiomegaly. There  is prominence of the central pulmonary arteries likely representing a degree of underlying pulmonary hypertension. Bibasilar and right upper lobe airspace densities again noted, similar or slightly worsened. Small bilateral pleural effusions. No pneumothorax. Partially visualized lower cervical fusion hardware. Left shoulder hemiarthroplasty. No acute fracture. IMPRESSION: 1. Bibasilar and right upper lobe airspace opacities, similar or slightly worsened compared to the prior radiograph. 2. Small bilateral pleural effusions similar to prior radiograph. Electronically Signed   By: Laren Everts.D.  On: 07/13/2016 06:52   Dg Chest Port 1 View  Result Date: 07/12/2016 CLINICAL DATA:  Endotracheal tube placement. EXAM: PORTABLE CHEST 1 VIEW COMPARISON:  Chest radiograph July 10, 2016 FINDINGS: Endotracheal tube tip projects 3.5 cm above the carina. LEFT internal jugular central venous catheter distal tip projects in proximal superior vena cava. Nasogastric tube side port projecting in proximal stomach, distal tip out of field-of-view. Increasing bibasilar airspace opacities, obscuring the hemidiaphragms. Cardiac silhouette is mildly enlarged, mildly calcified aortic knob. Improved RIGHT upper lobe consolidation with persistent interstitial prominence. Small pleural effusions. No pneumothorax. LEFT humeral arthroplasty, osseous structures unchanged. ACDF. IMPRESSION: Endotracheal tube tip projects 3.5 cm above the carina. LEFT internal jugular central venous catheter distal tip projects in proximal superior vena cava. Nasogastric tube side port projecting in proximal stomach, distal tip out of field-of-view. Worsening bibasilar consolidation with improved aeration of RIGHT upper lobe, findings suggest shifting pulmonary edema, pneumonia is possible. Small pleural effusions. Electronically Signed   By: Elon Alas M.D.   On: 07/12/2016 16:02   Dg Chest Port 1 View  Result Date: 07/09/2016 CLINICAL  DATA:  Respiratory failure. EXAM: PORTABLE CHEST 1 VIEW COMPARISON:  07/07/2016 FINDINGS: Left shoulder arthroplasty and lower cervical spine fixation. Mildly degraded exam due to AP portable technique and patient body habitus. New or increased right and persistent small left pleural effusion. No pneumothorax. Mild interstitial edema persists. Worsened right upper lobe airspace disease. Increased bibasilar airspace opacities. IMPRESSION: Overall worsened aeration, with increased interstitial edema and multifocal airspace opacities. Persistent left and new or increased layering right pleural effusion. Electronically Signed   By: Abigail Miyamoto M.D.   On: 07/09/2016 07:28   Dg Chest Port 1 View  Result Date: 07/07/2016 CLINICAL DATA:  Initial evaluation for acute respiratory failure. EXAM: PORTABLE CHEST 1 VIEW COMPARISON:  Prior radiograph from 07/06/2016. FINDINGS: Stable cardiomegaly.  Mediastinal silhouette within normal limits. Lungs mildly hypoinflated. Previously noted right upper lobe infiltrate again seen, improved from previous. Improved underlying vascular congestion as compared to prior. Increased density at the left lung base may reflect atelectasis or infiltrate. Possible small left pleural effusion. No pneumothorax. No acute osseus abnormality. Cervical ACDF and left shoulder arthroplasty noted. Degenerative changes noted at the right shoulder. IMPRESSION: 1. Persistent but improved right upper lobe infiltrate, suggesting improved pneumonia. 2. Improved underlying pulmonary vascular congestion as compared to 07/06/16. 3. Increased density at the left lung base, which may reflect atelectasis or infiltrate. Possible small left pleural effusion may be present. Electronically Signed   By: Jeannine Boga M.D.   On: 07/07/2016 03:43   Dg Chest Port 1 View  Result Date: 07/06/2016 CLINICAL DATA:  Pneumonia. EXAM: PORTABLE CHEST 1 VIEW COMPARISON:  Radiograph of July 04, 2016. FINDINGS: Stable  cardiomegaly. No pneumothorax is noted. Increased right upper lobe opacity is noted consistent with pneumonia. Mild bibasilar subsegmental atelectasis or infiltrates are noted. Probable minimal bilateral pleural effusions are noted. Status post left shoulder arthroplasty. Degenerative joint disease is seen involving the right glenohumeral joint. IMPRESSION: Increased right upper lobe opacity is noted consistent with pneumonia. Mild bibasilar subsegmental atelectasis or infiltrates are noted. Electronically Signed   By: Marijo Conception, M.D.   On: 07/06/2016 10:21   Dg Chest Port 1v Same Day  Result Date: 07/17/2016 CLINICAL DATA:  Pleural effusion EXAM: PORTABLE CHEST 1 VIEW COMPARISON:  07/17/2016 FINDINGS: Cardiac shadow is enlarged but stable. Endotracheal tube, nasogastric catheter and left jugular central line are again seen and stable. Improved aeration  is noted bilaterally. Some persistent rounded density is noted in the right mid lung likely related to fluid in the minor fissure. Small pleural effusion is noted. Mild interstitial changes are seen. IMPRESSION: Significantly improved aeration when compared with the prior exam in the right lung. Fluid in the minor fissure remains. Tubes and lines as described. Electronically Signed   By: Inez Catalina M.D.   On: 07/17/2016 11:19   US Thoracentesis Asp Pleural Space W/img Guide  Result Date: 07/17/2016 INDICATION: Respiratory failure, ventilatory support, right pleural effusion EXAM: ULTRASOUND GUIDED RIGHT THORACENTESIS MEDICATIONS: 1% lidocaine locally COMPLICATIONS: None immediate. PROCEDURE: An ultrasound guided thoracentesis was thoroughly discussed with the patient and questions answered. The benefits, risks, alternatives and complications were also discussed. The patient understands and wishes to proceed with the procedure. Written consent was obtained. Ultrasound was performed to localize and mark an adequate pocket of fluid in the right chest. The  area was then prepped and draped in the normal sterile fashion. 1% Lidocaine was used for local anesthesia. Under ultrasound guidance a 6 Fr Safe-T-Centesis catheter was introduced. Thoracentesis was performed. The catheter was removed and a dressing applied. FINDINGS: A total of approximately 500 cc of amber colored pleural fluid was removed. Samples were sent to the laboratory as requested by the clinical team. IMPRESSION: Successful ultrasound guided right thoracentesis yielding 500 cc of pleural fluid. Electronically Signed   By: Jerilynn Mages.  Shick M.D.   On: 07/17/2016 14:34     Assessment and Recommendation  71 y.o. female with the known chronic obstructive pulmonary disease sleep apnea with acute on chronic exacerbation of COPD causing hypoxia and respiratory failure with acute on chronic diastolic dysfunction heart failure without evidence of myocardial infarction now improving  I1. Continue supportive care of respiratory failure and bronchitis with antibiotics and inhalers and oxygen 2. Furosemide for continued pleural effusions and pulmonary edema now resolving 3. No further cardiac diagnostics necessary at this time due to no evidence of myocardial infarction 4. Begin ambulation and follow for improvements of symptoms and need for adjustments of medication managementAnd possible discharge to home with follow-up in one to 2 weeks further adjustments of medications  Signed, Serafina Royals M.D. FACC

## 2016-08-03 NOTE — Progress Notes (Signed)
Pharmacy consult: Electrolyte replacement  AM K+ 2.9. 20 mEq PO x 3 doses ordered. Recheck BMP with tomorrow AM labs.  Sim Boast, PharmD, BCPS  08/03/16 6:55 AM

## 2016-08-03 NOTE — NC FL2 (Signed)
[place] LEVEL OF CARE SCREENING TOOL     IDENTIFICATION  Patient Name: Hannah Powell Birthdate: 1945/12/12 Sex: female Admission Date (Current Location): 08/01/2016  [place] and [place] Number:  Engineering geologist and Address:  [facility], [address], [place], [place] [address]      Provider Number: [id number]  Attending Physician Name and Address:  Bettey Costa, MD  Relative Name and Phone Number:       Current Level of Care: Hospital Recommended Level of Care: [place] Prior Approval Number:    Date Approved/Denied:   PASRR Number:    Discharge Plan: SNF    Current Diagnoses: Patient Active Problem List   Diagnosis Date Noted  . Respiratory failure ([place]) 08/01/2016  . Pulmonary infiltrates   . Pneumonia due to Streptococcus pneumoniae ([place])   . Fever   . Community acquired pneumonia of right lung   . Palliative care by specialist   . Goals of care, counseling/discussion   . Pressure injury of skin 07/03/2016  . Acute respiratory failure ([place]) 07/02/2016  . Acute on chronic diastolic CHF (congestive heart failure) ([place]) 07/21/2015  . COPD exacerbation ([place]) 07/21/2015  . Acute renal insufficiency 07/21/2015  . Hyperglycemia 07/21/2015  . Benign essential HTN 07/21/2015  . Acute on chronic respiratory failure with hypoxia and hypercapnia ([place]) 07/18/2015  . S/P shoulder replacement 07/02/2013    Orientation RESPIRATION BLADDER Height & Weight     Self, Situation, Place  O2, Normal (3 liters and bipap at night) Incontinent Weight: 247 lb 9.2 oz (112.3 kg) Height:  5\' 3"  (160 cm)  BEHAVIORAL SYMPTOMS/MOOD NEUROLOGICAL BOWEL NUTRITION STATUS   (none)  (none) Incontinent Diet (cardiac)  AMBULATORY STATUS COMMUNICATION OF NEEDS Skin   Extensive Assist Verbally Normal                       Personal Care Assistance Level of Assistance  Dressing, Bathing Bathing Assistance: Maximum  assistance Feeding assistance: Limited assistance Dressing Assistance: Maximum assistance     Functional Limitations Info   (none)          SPECIAL CARE FACTORS FREQUENCY  PT (By licensed PT)                    Contractures Contractures Info: Not present    Additional Factors Info  Code Status, Allergies Code Status Info: full Allergies Info: lyrica           Current Medications (08/03/2016):  This is the current hospital active medication list Current Facility-Administered Medications  Medication Dose Route Frequency Provider Last Rate Last Dose  . acetaminophen (TYLENOL) tablet 650 mg  650 mg Oral Q6H PRN Varughese, Bincy S, NP      . bisacodyl (DULCOLAX) EC tablet 5 mg  5 mg Oral Daily PRN Mody, Sital, MD      . budesonide (PULMICORT) nebulizer solution 0.5 mg  0.5 mg Nebulization BID Laverle Hobby, MD   0.5 mg at 08/03/16 0753  . chlorhexidine (PERIDEX) 0.12 % solution 15 mL  15 mL Mouth Rinse BID Laverle Hobby, MD   15 mL at 08/02/16 0908  . enoxaparin (LOVENOX) injection 40 mg  40 mg Subcutaneous Q12H Mody, Sital, MD   40 mg at 08/03/16 1211  . furosemide (LASIX) tablet 40 mg  40 mg Oral BID Flora Lipps, MD      . hydrALAZINE (APRESOLINE) injection 10 mg  10 mg Intravenous Q6H PRN  Bettey Costa, MD      . HYDROcodone-acetaminophen (NORCO/VICODIN) 5-325 MG per tablet 1-2 tablet  1-2 tablet Oral Q4H PRN Mody, Sital, MD      . ipratropium-albuterol (DUONEB) 0.5-2.5 (3) MG/3ML nebulizer solution 3 mL  3 mL Nebulization Q4H Mody, Sital, MD   3 mL at 08/03/16 1109  . levothyroxine (SYNTHROID, LEVOTHROID) tablet 200 mcg  200 mcg Oral QAC breakfast Varughese, Bincy S, NP   200 mcg at 08/03/16 0939  . MEDLINE mouth rinse  15 mL Mouth Rinse q12n4p Laverle Hobby, MD   15 mL at 08/02/16 1705  . [START ON 08/04/2016] methylPREDNISolone sodium succinate (SOLU-MEDROL) 40 mg/mL injection 40 mg  40 mg Intravenous Daily Mody, Sital, MD      . ondansetron  (ZOFRAN) tablet 4 mg  4 mg Oral Q6H PRN Mody, Sital, MD       Or  . ondansetron (ZOFRAN) injection 4 mg  4 mg Intravenous Q6H PRN Mody, Sital, MD      . potassium chloride SA (K-DUR,KLOR-CON) CR tablet 40 mEq  40 mEq Oral Q4H Mody, Sital, MD   40 mEq at 08/03/16 0940  . pramipexole (MIRAPEX) tablet 4 mg  4 mg Oral QHS Varughese, Bincy S, NP   4 mg at 08/02/16 2119  . senna-docusate (Senokot-S) tablet 1 tablet  1 tablet Oral QHS PRN Bettey Costa, MD         Discharge Medications: Please see discharge summary for a list of discharge medications.  Relevant Imaging Results:  Relevant Lab Results:   Additional Information ss: [id number]  Shela Leff, LCSW

## 2016-08-03 NOTE — Clinical Social Work Note (Signed)
CSW informed by Hannah Powell at [facility] that they were not able to receive authorization from [facility] by end of business day, thus, it will be Monday before they will hear from the insurance company. CSW has updated patient and her husband this afternoon. [facility] at [facility] states the bipap should be at their facility by this evening. Unfortunately, [facility] does not accept a Letter of Guarantee from [facility] so patient cannot discharge until insurance Josem Kaufmann has been obtained. Hannah Powell MSW,LCSW [phone]

## 2016-08-03 NOTE — Progress Notes (Addendum)
[place] [facility] [place] NAME: Hannah Powell    MR#:  [id number]  DATE OF BIRTH:  Oct 13, 1945  SUBJECTIVE:   Patient doing well   REVIEW OF SYSTEMS:    Review of Systems  Constitutional: Negative for fever, chills weight loss HENT: Negative for ear pain, nosebleeds, congestion, facial swelling, rhinorrhea, neck pain, neck stiffness and ear discharge.   Respiratory: Negative for cough, shortness of breath, wheezing  Cardiovascular: Negative for chest pain, palpitations and leg swelling.  Gastrointestinal: Negative for heartburn, abdominal pain, vomiting, diarrhea or consitpation Genitourinary: Negative for dysuria, urgency, frequency, hematuria Musculoskeletal: Negative for back pain or joint pain Neurological: Negative for dizziness, seizures, syncope, focal weakness,  numbness and headaches.  Hematological: Does not bruise/bleed easily.  Psychiatric/Behavioral: Negative for hallucinations, confusion, dysphoric mood    Tolerating Diet:yes      DRUG ALLERGIES:   Allergies  Allergen Reactions  . Lyrica [Pregabalin] Other (See Comments)    Reaction: made Restless Leg Syndrome worse     VITALS:  Blood pressure (!) 158/90, pulse 77, temperature 98.3 F (36.8 C), temperature source Oral, resp. rate (!) 21, height 5\' 3"  (1.6 m), weight 112.3 kg (247 lb 9.2 oz), SpO2 100 %.  PHYSICAL EXAMINATION:  Constitutional: Appears well-developed and well-nourished. No distress. HENT: Normocephalic. . Eyes: Conjunctivae and EOM are normal. PERRLA, no scleral icterus.  Neck: Normal ROM. Neck supple. No JVD. No tracheal deviation. CVS: RRR, S1/S2 +, no murmurs, no gallops, no carotid bruit.  Pulmonary: Effort and breath sounds normal, no stridor, rhonchi, wheezes, rales.  Abdominal: Soft. BS +,  no distension, tenderness, rebound or guarding.  Musculoskeletal: Normal range of motion.   Neuro: Alert. CN 2-12 grossly intact. No focal deficits. Skin:  Skin is warm and dry. No rash noted. Psychiatric: Normal mood and affect.      LABORATORY PANEL:   CBC  Recent Labs Lab 08/03/16 0415  WBC 5.9  HGB 11.0*  HCT 33.2*  PLT 143*   ------------------------------------------------------------------------------------------------------------------  Chemistries   Recent Labs Lab 08/01/16 0149  08/03/16 0415  NA 146*  < > 139  K 3.5  < > 2.9*  CL 91*  < > 82*  CO2 48*  < > 48*  GLUCOSE 113*  < > 148*  BUN 13  < > 17  CREATININE 0.62  < > 0.74  CALCIUM 8.8*  < > 8.7*  MG  --   --  1.8  AST 27  --   --   ALT 18  --   --   ALKPHOS 74  --   --   BILITOT 0.8  --   --   < > = values in this interval not displayed. ------------------------------------------------------------------------------------------------------------------  Cardiac Enzymes  Recent Labs Lab 08/01/16 1145 08/01/16 1716 08/01/16 2326  TROPONINI <0.03 <0.03 <0.03   ------------------------------------------------------------------------------------------------------------------  RADIOLOGY:  Ct Angio Chest Pe W Or Wo Contrast  Result Date: 08/01/2016 CLINICAL DATA:  Acute respiratory failure. EXAM: CT ANGIOGRAPHY CHEST WITH CONTRAST TECHNIQUE: Multidetector CT imaging of the chest was performed using the standard protocol during bolus administration of intravenous contrast. Multiplanar CT image reconstructions and MIPs were obtained to evaluate the vascular anatomy. CONTRAST:  75 mL of Isovue 370 intravenously. COMPARISON:  CT scan of July 13, 2016. FINDINGS: Cardiovascular: Atherosclerosis of thoracic aorta is noted without aneurysm or dissection. There is no definite evidence of pulmonary embolus. No pericardial effusion is noted. Mediastinum/Nodes: No enlarged mediastinal, hilar, or axillary lymph  nodes. Thyroid gland, trachea, and esophagus demonstrate no significant findings. Lungs/Pleura: Mild bilateral pleural effusions are noted with adjacent  atelectasis or infiltrates. No pneumothorax is noted. Upper Abdomen: No acute abnormality. Musculoskeletal: Status post left shoulder arthroplasty. No acute abnormality seen. Review of the MIP images confirms the above findings. IMPRESSION: No definite evidence of pulmonary embolus. Aortic atherosclerosis. Mild bilateral pleural effusions are noted with adjacent subsegmental atelectasis or infiltrates. Electronically Signed   By: Marijo Conception, M.D.   On: 08/01/2016 16:35   Dg Chest Port 1 View  Result Date: 08/02/2016 CLINICAL DATA:  Respiratory failure. EXAM: PORTABLE CHEST 1 VIEW COMPARISON:  Chest x-ray 08/01/2016, 07/21/2016.  CT 08/01/2016. FINDINGS: Cardiomegaly with mild pulmonary vascular prominence, bilateral interstitial prominence and bilateral pleural effusions consistent with CHF. Similar findings noted on prior exam. Low lung volumes with basilar atelectasis. No pneumothorax. Prior cervicothoracic spine fusion. Left shoulder replacement. IMPRESSION: 1. Findings consistent with congestive heart failure with bilateral pulmonary interstitial edema and bilateral pleural effusions. similar findings noted on prior exam. 2. Low lung volumes with basilar atelectasis again noted. Electronically Signed   By: Marcello Moores  Register   On: 08/02/2016 06:19     ASSESSMENT AND PLAN:   71 year old female with history of chronic respiratory failure on 2-3 L of oxygen due to COPD and chronic diastolic heart failure who presents from the skilled nursing facility with altered mental status and CO2 narcosis.  1. Acute hypoxic/hypercarbic respiratory failure in the setting of COPD and CHF exacerbation She was initiated on BiPAP and then high flow. She is now on her baseline nasal cannula at 3 L. She has no complaints of shortness of breath. CT of the chest did not show evidence of pulmonary emboli.  2. Acute on chronic diastolic heart failure with preserved ejection fraction: Patient has diuresed using IV  Lasix. She will be referred to CHF clinic. She will also need monitoring of her BMP. She will continue with potassium supplementation. She will be discharged on by mouth Lasix. [facility] cardiology consultation appreciated  3. Acute COPD exacerbation, mild: Patient has no wheezing on examination. She will be discharged with prednisone and Levaquin.  4. Essential hypertension: Patient will resume outpatient medications 5. Hypothyroidism: She will continue Synthroid  6. Mild hypernatremia:This has resolved  7. OSA; she wears CPAP at night  8. Hypokalemia: This was repleted  Physical therapy is recommending skilled nursing facility  Management plans discussed with the patient and she is in agreement.  CODE STATUS: FULL  TOTAL TIME TAKING CARE OF THIS PATIENT: 27 minutes.     POSSIBLE D/C today, DEPENDING ON CLINICAL CONDITION.   Zaden Sako M.D on 08/03/2016 at 8:57 AM  Between 7am to 6pm - Pager - [phone] After 6pm go to [email or web address] - password [email or web address] [place] [facility]  Office  [phone]  CC: Primary care physician; Adin Hector, MD  Note: This dictation was prepared with Dragon dictation along with smaller phrase technology. Any transcriptional errors that result from this process are unintentional.

## 2016-08-03 NOTE — Clinical Social Work Note (Signed)
Clinical Social Work Assessment  Patient Details  Name: Hannah Powell MRN: [id number] Date of Birth: 12-03-1945  Date of referral:  08/03/16               Reason for consult:  Facility Placement                Permission sought to share information with:    Permission granted to share information::     Name::        Agency::     Relationship::     Contact Information:     Housing/Transportation Living arrangements for the past 2 months:  [place], [place] of Information:  Facility Patient Interpreter Needed:  None Criminal Activity/Legal Involvement Pertinent to Current Situation/Hospitalization:  No - Comment as needed Significant Relationships:  Spouse Lives with:  Self Do you feel safe going back to the place where you live?  Yes Need for family participation in patient care:  Yes (Comment)  Care giving concerns:  Patient has been at [facility] for short term rehab.    Social Worker assessment / plan:  Physician was contemplating discharging patient today to return to [facility]. PT had not assessed patient as of this morning and RN CM coordinated with getting PT to assess patient as soon as possible so that re auth could be started by [facility] with Medicare [employer]. PT recommended STR. CSW sent this assessment with recommendation to [facility] so that they could begin prior authorization. Some time later, [name] at [facility] stated patient's husband called him and stated that patient would require a bipap at their facility. CSW explained that the attending mentioned nothing about a bipap in her discharge information. CSW contacted the RN CM who spoke with the ICU physician and he spoke with husband and decided to now order bipap at the facility. CSW provided the bipap settings to [place] at [facility] and he was going to get this arranged. [name] was going to call CSW once they obtained auth. Patient is to move out to the  medical floor from ICU today.  Employment status:  Retired Nurse, adult PT Recommendations:  [place] / Referral to community resources:     Patient/Family's Response to care:  Patient's husband has been very persistent with staff.   Patient/Family's Understanding of and Emotional Response to Diagnosis, Current Treatment, and Prognosis:  Patient's husband spoke with physician regarding his concerns surrounding patient potentially discharging today.   Emotional Assessment Appearance:    Attitude/Demeanor/Rapport:    Affect (typically observed):    Orientation:  Oriented to Self, Oriented to Place, Oriented to Situation Alcohol / Substance use:  Not Applicable Psych involvement (Current and /or in the community):  No (Comment)  Discharge Needs  Concerns to be addressed:  Care Coordination Readmission within the last 30 days:  No Current discharge risk:  None Barriers to Discharge:  [place], [place] 08/03/2016, 1:55 PM

## 2016-08-03 NOTE — Evaluation (Signed)
Physical Therapy Evaluation Patient Details Name: Hannah Powell MRN: [id number] DOB: 17-Dec-1945 Today's Date: 08/03/2016   History of Present Illness  71 y/o female who was here in CCU mid April in/out of the CCU with respiratory distress.  She had done well enough to discharge to rehab but has been unable to do much activity there and now returns with respiratory distress gradually getting down to 3 liters O2 via [place] at time of PT exam.  Clinical Impression  Pt showed good effort with PT exam but is very weak and limited functionally.  She was on 3 liters O2 t/o the session and though she generally stayed consistent with sats (90-93% at rest and with light activity, 88% after getting to recliner) she was very fatigued with the effort.  She needed some assist getting to EOB and more assist with getting to standing and taking just a few small turning steps to get to the recliner.  Pt is very weak, but reports that she did more mobility today than she has in over a month.  Pt will need continued PT at rehab to increase strength, mobility and work toward PLOF.     Follow Up Recommendations SNF    Equipment Recommendations       Recommendations for Other Services       Precautions / Restrictions Precautions Precautions: Fall Restrictions Weight Bearing Restrictions: No      Mobility  Bed Mobility Overal bed mobility: Needs Assistance Bed Mobility: Supine to Sit Rolling: Min assist (Pt nearly able to get to sitting w/o assist, heavy rail use)         General bed mobility comments: Pt was able to scoot to EOB with heavy cuing, did intially want PT to help more but she ended up doing most of the transition w/o direct assist  Transfers Overall transfer level: Needs assistance Equipment used: Rolling walker (2 wheeled) Transfers: Sit to/from Stand Sit to Stand: Max assist;Mod assist         General transfer comment: Pt unable to lift hips on first attempt with min assist,  with readjustment, increased cuing and encouragement she was able to achieve standing with heavy assist and walker use.  Ambulation/Gait Ambulation/Gait assistance: Min assist Ambulation Distance (Feet): 3 Feet Assistive device: Rolling walker (2 wheeled)       General Gait Details: Pt struggled to take the few steps from bed to recliner but was able to to discribute enough weight through walker to stay uprightnd require only minimal assist to stay upright and take small, choppy steps.    Stairs            Wheelchair Mobility    Modified Rankin (Stroke Patients Only)       Balance Overall balance assessment: Needs assistance   Sitting balance-Leahy Scale: Good       Standing balance-Leahy Scale: Poor Standing balance comment: Pt lacking confidence with standing even with heavy walker use                             Pertinent Vitals/Pain Pain Assessment: No/denies pain    Home Living Family/patient expects to be discharged to:: Skilled nursing facility Living Arrangements: Spouse/significant other Available Help at Discharge: Available 24 hours/day;Family Type of Home: House Home Access: Stairs to enter Entrance Stairs-Rails: Can reach both Entrance Stairs-Number of Steps: 5   Home Equipment: Crutches;Walker - standard;Bedside commode;Hand held shower head;Grab bars - tub/shower;Grab bars - toilet;Toilet  riser      Prior Function Level of Independence: Independent with assistive device(s)         Comments: Inconsistent but pt indicated that she could be out running errands and apparently do all she needs?     Hand Dominance        Extremity/Trunk Assessment   Upper Extremity Assessment Upper Extremity Assessment: Generalized weakness;Overall [facility] for tasks assessed    Lower Extremity Assessment Lower Extremity Assessment: Overall WFL for tasks assessed;Generalized weakness       Communication   Communication: No difficulties   Cognition Arousal/Alertness: Awake/alert Behavior During Therapy: WFL for tasks assessed/performed Overall Cognitive Status: Within Functional Limits for tasks assessed                                        General Comments      Exercises     Assessment/Plan    PT Assessment Patient needs continued PT services  PT Problem List Decreased strength;Decreased mobility;Decreased activity tolerance;Cardiopulmonary status limiting activity;Decreased balance;Decreased knowledge of use of DME       PT Treatment Interventions DME instruction;Gait training;Stair training;Functional mobility training;Balance training;Therapeutic exercise;Therapeutic activities;Patient/family education    PT Goals (Current goals can be found in the Care Plan section)  Acute Rehab PT Goals Patient Stated Goal: get stronger at rehab PT Goal Formulation: With patient Time For Goal Achievement: 08/16/16 Potential to Achieve Goals: Fair    Frequency Min 2X/week   Barriers to discharge        Co-evaluation               AM-PAC PT "6 Clicks" Daily Activity  Outcome Measure Difficulty turning over in bed (including adjusting bedclothes, sheets and blankets)?: Total Difficulty moving from lying on back to sitting on the side of the bed? : Total Difficulty sitting down on and standing up from a chair with arms (e.g., wheelchair, bedside commode, etc,.)?: Total Help needed moving to and from a bed to chair (including a wheelchair)?: A Lot Help needed walking in hospital room?: Total Help needed climbing 3-5 steps with a railing? : Total 6 Click Score: 7    End of Session Equipment Utilized During Treatment: Gait belt;Oxygen (3 liters O2 90-93% t/o most of session drops to 88% with amb) Activity Tolerance: Patient limited by fatigue Patient left: with call bell/phone within reach;with chair alarm set;with nursing/sitter in room Nurse Communication: Mobility status PT Visit  Diagnosis: Difficulty in walking, not elsewhere classified (R26.2);Muscle weakness (generalized) (M62.81)    Time: 1791-5056 PT Time Calculation (min) (ACUTE ONLY): 27 min   Charges:   PT Evaluation $PT Eval Low Complexity: 1 Procedure     PT G Codes:        Kreg Shropshire, DPT 08/03/2016, 10:12 AM

## 2016-08-03 NOTE — Progress Notes (Signed)
report called to RM 118.  txr via w/c. Husband notified via phone

## 2016-08-03 NOTE — Care Management (Signed)
Patient pending PT evaluation and if SNF is recommended [facility] authorization will be required in order for her to return. MD has been updated and understood that if patient came from SNF patient could return to SNF however [employer] still has to authorize based on medical necessity. CSW notified that PT order is in place and discharge is pending. Patient's safety is a concern and she will need to be evaluated by PT. RNCM would consider transferring to lower level of care within the hospital until safety can be evaluated by PT.

## 2016-08-03 NOTE — Progress Notes (Signed)
  Patient with End Stage COPD/CHF with chronic hypoxic respiratory failure and will need NON-INVASIVE VENTILATION in conjunction with oxygen Therapy. She will need this intervention to survive and to prevent further admissions to the hospital.  Her settings that she will need is BIPAP with settings of  IPAP 15 and EPAP 8, with 40% oxygen supplementation with Back up rate set at 10 every time she sleeps and as needed.   Corrin Parker, M.D.  [facility]  Medical Director [place] Director [facility] Cardio-Pulmonary Department

## 2016-08-04 DIAGNOSIS — J9601 Acute respiratory failure with hypoxia: Secondary | ICD-10-CM

## 2016-08-04 DIAGNOSIS — J441 Chronic obstructive pulmonary disease with (acute) exacerbation: Secondary | ICD-10-CM

## 2016-08-04 LAB — BASIC METABOLIC PANEL
Anion gap: 9 (ref 5–15)
BUN: 16 mg/dL (ref 6–20)
CHLORIDE: 85 mmol/L — AB (ref 101–111)
CO2: 48 mmol/L — ABNORMAL HIGH (ref 22–32)
CREATININE: 0.61 mg/dL (ref 0.44–1.00)
Calcium: 8.5 mg/dL — ABNORMAL LOW (ref 8.9–10.3)
GFR calc Af Amer: >60 mL/min (ref >60)
GFR calc non Af Amer: >60 mL/min (ref >60)
Glucose, Bld: 91 mg/dL (ref 65–99)
Potassium: 2.6 mmol/L — CL (ref 3.5–5.1)
Sodium: 142 mmol/L (ref 135–145)

## 2016-08-04 LAB — POTASSIUM: POTASSIUM: 3.9 mmol/L (ref 3.5–5.1)

## 2016-08-04 LAB — MAGNESIUM: MAGNESIUM: 2.1 mg/dL (ref 1.7–2.4)

## 2016-08-04 MED ORDER — PREDNISONE 50 MG PO TABS
50.0000 mg | ORAL_TABLET | Freq: Every day | ORAL | Status: DC
  Administered 2016-08-04 – 2016-08-06 (×3): 50 mg via ORAL
  Filled 2016-08-04 (×3): qty 1

## 2016-08-04 MED ORDER — POTASSIUM CHLORIDE 10 MEQ/100ML IV SOLN
10.0000 meq | INTRAVENOUS | Status: AC
  Administered 2016-08-04 (×4): 10 meq via INTRAVENOUS
  Filled 2016-08-04 (×4): qty 100

## 2016-08-04 MED ORDER — POTASSIUM CHLORIDE CRYS ER 20 MEQ PO TBCR
40.0000 meq | EXTENDED_RELEASE_TABLET | Freq: Two times a day (BID) | ORAL | Status: DC
  Administered 2016-08-04 – 2016-08-06 (×5): 40 meq via ORAL
  Filled 2016-08-04 (×6): qty 2

## 2016-08-04 MED ORDER — ACETAZOLAMIDE SODIUM 500 MG IJ SOLR
500.0000 mg | Freq: Once | INTRAMUSCULAR | Status: AC
Start: 2016-08-04 — End: 2016-08-04
  Administered 2016-08-04: 15:00:00 500 mg via INTRAVENOUS
  Filled 2016-08-04: qty 500

## 2016-08-04 MED ORDER — LEVOFLOXACIN 750 MG PO TABS
750.0000 mg | ORAL_TABLET | Freq: Every day | ORAL | Status: DC
  Administered 2016-08-04 – 2016-08-06 (×3): 750 mg via ORAL
  Filled 2016-08-04 (×3): qty 1

## 2016-08-04 MED ORDER — MAGNESIUM SULFATE 2 GM/50ML IV SOLN
2.0000 g | Freq: Once | INTRAVENOUS | Status: AC
  Administered 2016-08-04: 2 g via INTRAVENOUS
  Filled 2016-08-04: qty 50

## 2016-08-04 NOTE — Progress Notes (Signed)
Hannah Powell at [place] NAME: Hannah Powell    MR#:  [id number]  DATE OF BIRTH:  1945/06/12  SUBJECTIVE:  No acute events overnight. Patient was not discharged to skilled nursing facility as there is no insurance approval  REVIEW OF SYSTEMS:    Review of Systems  Constitutional: Negative for fever, chills weight loss HENT: Negative for ear pain, nosebleeds, congestion, facial swelling, rhinorrhea, neck pain, neck stiffness and ear discharge.   Respiratory: Negative for cough, shortness of breath, wheezing  Cardiovascular: Negative for chest pain, palpitations and leg swelling.  Gastrointestinal: Negative for heartburn, abdominal pain, vomiting, diarrhea or consitpation Genitourinary: Negative for dysuria, urgency, frequency, hematuria Musculoskeletal: Negative for back pain or joint pain Neurological: Negative for dizziness, seizures, syncope, focal weakness,  numbness and headaches.  Hematological: Does not bruise/bleed easily.  Psychiatric/Behavioral: Negative for hallucinations, confusion, dysphoric mood    Tolerating Diet:yes      DRUG ALLERGIES:   Allergies  Allergen Reactions  . Lyrica [Pregabalin] Other (See Comments)    Reaction: made Restless Leg Syndrome worse     VITALS:  Blood pressure (!) 150/85, pulse 82, temperature 98.1 F (36.7 C), temperature source Oral, resp. rate (!) 21, height 5\' 5"  (1.651 m), weight 108.3 kg (238 lb 11.2 oz), SpO2 92 %.  PHYSICAL EXAMINATION:  Constitutional: Appears well-developed and well-nourished. No distress. HENT: Normocephalic. . Eyes: Conjunctivae and EOM are normal. PERRLA, no scleral icterus.  Neck: Normal ROM. Neck supple. No JVD. No tracheal deviation. CVS: RRR, S1/S2 +, no murmurs, no gallops, no carotid bruit.  Pulmonary: Effort and breath sounds normal, no stridor, rhonchi, wheezes, rales.  Abdominal: Soft. BS +,  no distension, tenderness, rebound or guarding.   Musculoskeletal: Normal range of motion.   Neuro: Alert. CN 2-12 grossly intact. No focal deficits. Skin: Skin is warm and dry. No rash noted. Psychiatric: Normal mood and affect.      LABORATORY PANEL:   CBC  Recent Labs Lab 08/03/16 0415  WBC 5.9  HGB 11.0*  HCT 33.2*  PLT 143*   ------------------------------------------------------------------------------------------------------------------  Chemistries   Recent Labs Lab 08/01/16 0149  08/03/16 0415  NA 146*  < > 139  K 3.5  < > 2.9*  CL 91*  < > 82*  CO2 48*  < > 48*  GLUCOSE 113*  < > 148*  BUN 13  < > 17  CREATININE 0.62  < > 0.74  CALCIUM 8.8*  < > 8.7*  MG  --   --  1.8  AST 27  --   --   ALT 18  --   --   ALKPHOS 74  --   --   BILITOT 0.8  --   --   < > = values in this interval not displayed. ------------------------------------------------------------------------------------------------------------------  Cardiac Enzymes  Recent Labs Lab 08/01/16 1145 08/01/16 1716 08/01/16 2326  TROPONINI <0.03 <0.03 <0.03   ------------------------------------------------------------------------------------------------------------------  RADIOLOGY:  No results found.   ASSESSMENT AND PLAN:   71 year old female with history of chronic respiratory failure on 2-3 L of oxygen due to COPD and chronic diastolic heart failure who presents from the skilled nursing facility with altered mental status and CO2 narcosis.  1. Acute hypoxic/hypercarbic respiratory failure in the setting of COPD and CHF exacerbation She was initiated on BiPAP and then high flow. She is now on her baseline nasal cannula at 3 L. She has no complaints of shortness of breath. CT of the chest did  not show evidence of pulmonary emboli.  2. Acute on chronic diastolic heart failure with preserved ejection fraction: Patient has diuresed using IV Lasix. She will be referred to CHF clinic. She will also need monitoring of her BMP. She will  continue with potassium supplementation. Continue by mouth Lasix. [facility] cardiology consultation appreciated  3. Acute COPD exacerbation, mild: Patient has no wheezing on examination. Continue  prednisone and Levaquin.  4. Essential hypertension: Patient will resume outpatient medications 5. Hypothyroidism: She will continue Synthroid  6. Mild hypernatremia:This has resolved  7. OSA: She has BiPAP set up at skilled nursing facility. 8. Hypokalemia: This was repleted  Physical therapy is recommending skilled nursing facility  Management plans discussed with the patient and she is in agreement.  CODE STATUS: FULL  TOTAL TIME TAKING CARE OF THIS PATIENT: 24 minutes.     POSSIBLE D/C Monday, DEPENDING ON insurance approval  Orlandis Sanden M.D on 08/04/2016 at 7:10 AM  Between 7am to 6pm - Pager - [phone] After 6pm go to [email or web address] - password [email or web address] [place] [facility]  Office  [phone]  CC: Primary care physician; Adin Hector, MD  Note: This dictation was prepared with Dragon dictation along with smaller phrase technology. Any transcriptional errors that result from this process are unintentional.

## 2016-08-04 NOTE — Progress Notes (Signed)
   Name: Hannah Powell MRN: [id number] DOB: [birth year]    ADMISSION DATE:  08/01/2016 CONSULTATION DATE: 08/01/2016  REFERRING MD : Dr. Benjie Karvonen  CHIEF COMPLAINT:  Altered mental status  BRIEF PATIENT DESCRIPTION:  71 yo female admitted 05/16 with altered mental status and acute on chronic hypoxic hypercapnic respiratory failure secondary to AECOPD and CHF exacerbation requiring continuous Bipap  SIGNIFICANT EVENTS  05/16-Pt admitted to ICU   STUDIES:  CT Head 05/16>>Age related atrophy with minimal chronic small vessel ischemia. No acute intracranial abnormality. Fluid levels in the sphenoid sinuses and left maxillary sinus, may be secondary to recent intubation or sinusitis    Review of Systems:  Gen:  Denies  fever, sweats, chills weigh loss  +fatigue and tiredness  HEENT: Denies blurred vision, double vision, ear pain, eye pain, hearing loss, nose bleeds, sore throat  Cardiac:  No dizziness, chest pain or heaviness, chest tightness,edema  Resp:  + shortness of breath  Other:  All other systems negative   SUBJECTIVE:  Feels better, WOB and SOB at baseline Needs biPAP as needed and while asleep to survive Husband at bedside updated  VITAL SIGNS: Temp:  [98.1 F (36.7 C)-98.2 F (36.8 C)] 98.1 F (36.7 C) (05/19 0435) Pulse Rate:  [82-93] 82 (05/19 0435) Resp:  [20-21] 21 (05/19 0435) BP: (128-152)/(68-85) 150/85 (05/19 0435) SpO2:  [92 %-98 %] 93 % (05/19 1129) FiO2 (%):  [35 %] 35 % (05/19 0013) Weight:  [238 lb 11.2 oz (108.3 kg)-240 lb 8 oz (109.1 kg)] 238 lb 11.2 oz (108.3 kg) (05/19 0500)  PHYSICAL EXAMINATION: General: well developed, well nourished Caucasian female Neuro: follows commands, PERRL  HEENT: supple, No JVD Cardiovascular: nsr, s1s2, no M/R/G Lungs: diminished throughout, even, non labored  Abdomen: +BS x4, soft, obese, non tender, non distended  Musculoskeletal: 2+ bilateral lower extremity edema, normal tone Skin: intact no rashes or  lesions   Recent Labs Lab 08/02/16 0610 08/03/16 0415 08/04/16 0540  NA 144 139 142  K 3.9 2.9* 2.6*  CL 87* 82* 85*  CO2 45* 48* 48*  BUN 17 17 16   CREATININE 0.64 0.74 0.61  GLUCOSE 123* 148* 91    Recent Labs Lab 08/01/16 0149 08/02/16 0610 08/03/16 0415  HGB 12.4 10.6* 11.0*  HCT 38.6 32.6* 33.2*  WBC 5.0 5.4 5.9  PLT 143* 144* 143*   No results found.  ASSESSMENT / PLAN: Acute on Chronic Hypoxic Hypercapnic Respiratory Failure secondary to AECOPD and CHF exacerbation Maintain O2 sats 88% to 92%  Scheduled and prn bronchodilator therapy On oral prednisone Continue diuresis with diamox and assess OUP and chem 7 Prn hydralazine for bp management BiPAP needed for sleep and as needed   Patient/Family are satisfied with Plan of action and management. All questions answered  Corrin Parker, M.D.  [facility]  Medical Director [place] Director [facility] Cardio-Pulmonary Department

## 2016-08-04 NOTE — Progress Notes (Signed)
Pharmacy Consult for [facility] Monitoring  Indication: Hypokalemia  Allergies  Allergen Reactions  . Lyrica [Pregabalin] Other (See Comments)    Reaction: made Restless Leg Syndrome worse     Patient Measurements: Height: 5\' 5"  (165.1 cm) Weight: 238 lb 11.2 oz (108.3 kg) IBW/kg (Calculated) : 57  Vital Signs: Temp: 98.2 F (36.8 C) (05/19 1418) Temp Source: Oral (05/19 1418) BP: 158/77 (05/19 1418) Pulse Rate: 80 (05/19 1418) Intake/Output from previous day: 05/18 0701 - 05/19 0700 In: -  Out: 300 [Urine:300] Intake/Output from this shift: Total I/O In: 930 [P.O.:480; IV Piggyback:450] Out: 2 [Urine:1; Stool:1]  Labs:  Recent Labs  08/02/16 0610 08/03/16 0415 08/04/16 0540  WBC 5.4 5.9  --   HGB 10.6* 11.0*  --   HCT 32.6* 33.2*  --   PLT 144* 143*  --   CREATININE 0.64 0.74 0.61  MG  --  1.8 2.1   Estimated Creatinine Clearance: 81.2 mL/min (by C-G formula based on SCr of 0.61 mg/dL).  Potassium (mmol/L)  Date Value  08/04/2016 3.9  07/03/2014 3.9   Calcium (mg/dL)  Date Value  08/04/2016 8.5 (L)   Calcium, Total (mg/dL)  Date Value  07/03/2014 8.8 (L)   Phosphorus (mg/dL)  Date Value  07/16/2016 2.7       Medical History: Past Medical History:  Diagnosis Date  . Anxiety   . Arthritis   . CHF (congestive heart failure) ([place])   . COPD (chronic obstructive pulmonary disease) ([place])   . Hypertension    dr Otho Najjar     . Hypothyroidism   . RLS (restless legs syndrome)   . Shortness of breath   . Sleep apnea    cpap      >5 yrs    Assessment: 71 y/o F admitted with AECHF and AECOPD.    Plan:  5/19 1749 : K+: 3.9. No additional replacement needed at this time. Will recheck electroyltes with AM labs and supplement as needed.   Pernell Dupre, PharmD, BCPS Clinical Pharmacist 08/04/2016 6:27 PM

## 2016-08-04 NOTE — Progress Notes (Signed)
Pt rested well this shift. Pt was compliant with wearing bipap this shift. Oxygen saturation > 92%. RT in to administer breathing treatment then patient requested bipap. Pt did request after 2 hours for bipap to be removed in order to moisten mouth. Pt continued to wear until this mornings vital signs. Pt denies pain or shortness of breath.

## 2016-08-04 NOTE — Progress Notes (Signed)
Patient requesting for Dr. Mortimer Fries to come see her.  Dr. Mortimer Fries paged and made aware, stated he will see patient later. Clarise Cruz, RN

## 2016-08-04 NOTE — Progress Notes (Signed)
Pharmacy Consult for [facility] Monitoring  Indication: Hypokalemia  Allergies  Allergen Reactions  . Lyrica [Pregabalin] Other (See Comments)    Reaction: made Restless Leg Syndrome worse     Patient Measurements: Height: 5\' 5"  (165.1 cm) Weight: 238 lb 11.2 oz (108.3 kg) IBW/kg (Calculated) : 57  Vital Signs: Temp: 98.1 F (36.7 C) (05/19 0435) Temp Source: Oral (05/19 0435) BP: 150/85 (05/19 0435) Pulse Rate: 82 (05/19 0435) Intake/Output from previous day: 05/18 0701 - 05/19 0700 In: -  Out: 300 [Urine:300] Intake/Output from this shift: No intake/output data recorded.  Labs:  Recent Labs  08/02/16 0610 08/03/16 0415 08/04/16 0540  WBC 5.4 5.9  --   HGB 10.6* 11.0*  --   HCT 32.6* 33.2*  --   PLT 144* 143*  --   CREATININE 0.64 0.74 0.61  MG  --  1.8  --    Estimated Creatinine Clearance: 81.2 mL/min (by C-G formula based on SCr of 0.61 mg/dL).  Potassium (mmol/L)  Date Value  08/04/2016 2.6 (LL)  07/03/2014 3.9   Calcium (mg/dL)  Date Value  08/04/2016 8.5 (L)   Calcium, Total (mg/dL)  Date Value  07/03/2014 8.8 (L)   Phosphorus (mg/dL)  Date Value  07/16/2016 2.7       Medical History: Past Medical History:  Diagnosis Date  . Anxiety   . Arthritis   . CHF (congestive heart failure) ([place])   . COPD (chronic obstructive pulmonary disease) ([place])   . Hypertension    dr Otho Najjar     . Hypothyroidism   . RLS (restless legs syndrome)   . Shortness of breath   . Sleep apnea    cpap      >5 yrs    Assessment: 71 y/o F admitted with AECHF and AECOPD.    Plan:  KCl 40 meq po once and KCl 10 meq iv x 4. Magnesium sulfate 2 g IV once. Will recheck K at 1800.   [name] 08/04/2016,7:32 AM

## 2016-08-04 NOTE — Progress Notes (Signed)
Pharmacy Antibiotic Note  Hannah Powell is a 71 y.o. female admitted on 08/01/2016 with pneumonia.  Pharmacy has been consulted for Levaquin dosing.  Plan: Levaquin 750 mg po daily.   Height: 5\' 5"  (165.1 cm) Weight: 238 lb 11.2 oz (108.3 kg) IBW/kg (Calculated) : 57  Temp (24hrs), Avg:98.2 F (36.8 C), Min:98.1 F (36.7 C), Max:98.3 F (36.8 C)   Recent Labs Lab 08/01/16 0149 08/02/16 0610 08/03/16 0415  WBC 5.0 5.4 5.9  CREATININE 0.62 0.64 0.74    Estimated Creatinine Clearance: 81.2 mL/min (by C-G formula based on SCr of 0.74 mg/dL).    Allergies  Allergen Reactions  . Lyrica [Pregabalin] Other (See Comments)    Reaction: made Restless Leg Syndrome worse     Antimicrobials this admission: Zosyn 5/16 >> 5/18 Levaquin 5/19 >>   Dose adjustments this admission:   Microbiology results: 5/16 BCx: NGTD 5/16 UCx: NG  5/16 MRSA PCR: negative  Thank you for allowing pharmacy to be a part of this patient's care.  [name] 08/04/2016 7:16 AM

## 2016-08-05 DIAGNOSIS — J9610 Chronic respiratory failure, unspecified whether with hypoxia or hypercapnia: Secondary | ICD-10-CM

## 2016-08-05 LAB — BASIC METABOLIC PANEL
Anion gap: 6 (ref 5–15)
BUN: 18 mg/dL (ref 6–20)
CHLORIDE: 91 mmol/L — AB (ref 101–111)
CO2: 43 mmol/L — AB (ref 22–32)
CREATININE: 0.68 mg/dL (ref 0.44–1.00)
Calcium: 8.5 mg/dL — ABNORMAL LOW (ref 8.9–10.3)
GFR calc Af Amer: >60 mL/min (ref >60)
GFR calc non Af Amer: >60 mL/min (ref >60)
Glucose, Bld: 104 mg/dL — ABNORMAL HIGH (ref 65–99)
Potassium: 3.2 mmol/L — ABNORMAL LOW (ref 3.5–5.1)
Sodium: 140 mmol/L (ref 135–145)

## 2016-08-05 LAB — MAGNESIUM: MAGNESIUM: 2.4 mg/dL (ref 1.7–2.4)

## 2016-08-05 MED ORDER — IPRATROPIUM-ALBUTEROL 0.5-2.5 (3) MG/3ML IN SOLN
3.0000 mL | Freq: Four times a day (QID) | RESPIRATORY_TRACT | Status: DC
  Administered 2016-08-05 – 2016-08-06 (×4): 3 mL via RESPIRATORY_TRACT
  Filled 2016-08-05 (×4): qty 3

## 2016-08-05 MED ORDER — ACETAZOLAMIDE SODIUM 500 MG IJ SOLR
500.0000 mg | Freq: Once | INTRAMUSCULAR | Status: AC
  Administered 2016-08-05: 10:00:00 500 mg via INTRAVENOUS
  Filled 2016-08-05: qty 500

## 2016-08-05 MED ORDER — DEXTROMETHORPHAN POLISTIREX ER 30 MG/5ML PO SUER
15.0000 mg | Freq: Once | ORAL | Status: AC
  Administered 2016-08-05: 15 mg via ORAL
  Filled 2016-08-05 (×2): qty 5

## 2016-08-05 MED ORDER — IPRATROPIUM-ALBUTEROL 0.5-2.5 (3) MG/3ML IN SOLN: 3.0000 mL | RESPIRATORY_TRACT | Status: DC | PRN

## 2016-08-05 MED ORDER — ALPRAZOLAM 0.5 MG PO TABS
0.5000 mg | ORAL_TABLET | Freq: Every evening | ORAL | Status: DC | PRN
  Administered 2016-08-05: 0.5 mg via ORAL
  Filled 2016-08-05: qty 1

## 2016-08-05 NOTE — Progress Notes (Signed)
Pharmacy Consult for [facility] Monitoring  Indication: Hypokalemia  Allergies  Allergen Reactions  . Lyrica [Pregabalin] Other (See Comments)    Reaction: made Restless Leg Syndrome worse     Patient Measurements: Height: 5\' 5"  (165.1 cm) Weight: 244 lb 2 oz (110.7 kg) IBW/kg (Calculated) : 57  Vital Signs: Temp: 98.1 F (36.7 C) (05/20 0503) Temp Source: Oral (05/20 0503) BP: 132/69 (05/20 0503) Pulse Rate: 87 (05/20 0503) Intake/Output from previous day: 05/19 0701 - 05/20 0700 In: 930 [P.O.:480; IV Piggyback:450] Out: 2 [Urine:1; Stool:1] Intake/Output from this shift: No intake/output data recorded.  Labs:  Recent Labs  08/03/16 0415 08/04/16 0540 08/05/16 0520  WBC 5.9  --   --   HGB 11.0*  --   --   HCT 33.2*  --   --   PLT 143*  --   --   CREATININE 0.74 0.61 0.68  MG 1.8 2.1 2.4   Estimated Creatinine Clearance: 82.2 mL/min (by C-G formula based on SCr of 0.68 mg/dL).  Potassium (mmol/L)  Date Value  08/05/2016 3.2 (L)  07/03/2014 3.9   Calcium (mg/dL)  Date Value  08/05/2016 8.5 (L)   Calcium, Total (mg/dL)  Date Value  07/03/2014 8.8 (L)   Phosphorus (mg/dL)  Date Value  07/16/2016 2.7       Medical History: Past Medical History:  Diagnosis Date  . Anxiety   . Arthritis   . CHF (congestive heart failure) ([place])   . COPD (chronic obstructive pulmonary disease) ([place])   . Hypertension    dr Otho Najjar     . Hypothyroidism   . RLS (restless legs syndrome)   . Shortness of breath   . Sleep apnea    cpap      >5 yrs    Assessment: 71 y/o F admitted with AECHF and AECOPD.    Plan:  Patient will receive 2 doses of scheduled  KCl 40 meq po subsequent to AM potassium level of 3.2 mg/dl. Magnesium WNL. Will f/u AM labs.   [name] 08/05/2016,11:46 AM

## 2016-08-05 NOTE — Progress Notes (Signed)
Patient c/o not being able to sleep. Pt denies pain or shortness of breath. Pt offered warm blanket or repositioning but declined. Pt states that she doesn't want any medication to help at this time but will call if unable to return to sleep. Bipap placed back on patient per patient request. Exit alarm activated.

## 2016-08-05 NOTE — Progress Notes (Signed)
Hannah Powell at [place] NAME: Hannah Powell    MR#:  [id number]  DATE OF BIRTH:  Jul 21, 1945  SUBJECTIVE:   Patient is doing well this morning. She was up to the bathroom all day yesterday from the [employer].  REVIEW OF SYSTEMS:    Review of Systems  Constitutional: Negative for fever, chills weight loss HENT: Negative for ear pain, nosebleeds, congestion, facial swelling, rhinorrhea, neck pain, neck stiffness and ear discharge.   Respiratory: Negative for cough, shortness of breath, wheezing  Cardiovascular: Negative for chest pain, palpitations and leg swelling.  Gastrointestinal: Negative for heartburn, abdominal pain, vomiting, diarrhea or consitpation Genitourinary: Negative for dysuria, urgency, frequency, hematuria Musculoskeletal: Negative for back pain or joint pain Neurological: Negative for dizziness, seizures, syncope, focal weakness,  numbness and headaches.  Hematological: Does not bruise/bleed easily.  Psychiatric/Behavioral: Negative for hallucinations, confusion, dysphoric mood    Tolerating Diet:yes      DRUG ALLERGIES:   Allergies  Allergen Reactions  . Lyrica [Pregabalin] Other (See Comments)    Reaction: made Restless Leg Syndrome worse     VITALS:  Blood pressure 132/69, pulse 87, temperature 98.1 F (36.7 C), temperature source Oral, resp. rate 18, height 5\' 5"  (1.651 m), weight 110.7 kg (244 lb 2 oz), SpO2 98 %.  PHYSICAL EXAMINATION:  Constitutional: Appears well-developed and well-nourished. No distress. HENT: Normocephalic. . Eyes: Conjunctivae and EOM are normal. PERRLA, no scleral icterus.  Neck: Normal ROM. Neck supple. No JVD. No tracheal deviation. CVS: RRR, S1/S2 +, no murmurs, no gallops, no carotid bruit.  Pulmonary: Effort and breath sounds normal, no stridor, rhonchi, wheezes, rales.  Abdominal: Soft. BS +,  no distension, tenderness, rebound or guarding.  Musculoskeletal: Normal range of motion.    Neuro: Alert. CN 2-12 grossly intact. No focal deficits. Skin: Skin is warm and dry. No rash noted. Psychiatric: Normal mood and affect.      LABORATORY PANEL:   CBC  Recent Labs Lab 08/03/16 0415  WBC 5.9  HGB 11.0*  HCT 33.2*  PLT 143*   ------------------------------------------------------------------------------------------------------------------  Chemistries   Recent Labs Lab 08/01/16 0149  08/04/16 0540  08/05/16 0520  NA 146*  < > 142  --  140  K 3.5  < > 2.6*  < > 3.2*  CL 91*  < > 85*  --  91*  CO2 48*  < > 48*  --  43*  GLUCOSE 113*  < > 91  --  104*  BUN 13  < > 16  --  18  CREATININE 0.62  < > 0.61  --  0.68  CALCIUM 8.8*  < > 8.5*  --  8.5*  MG  --   < > 2.1  --   --   AST 27  --   --   --   --   ALT 18  --   --   --   --   ALKPHOS 74  --   --   --   --   BILITOT 0.8  --   --   --   --   < > = values in this interval not displayed. ------------------------------------------------------------------------------------------------------------------  Cardiac Enzymes  Recent Labs Lab 08/01/16 1145 08/01/16 1716 08/01/16 2326  TROPONINI <0.03 <0.03 <0.03   ------------------------------------------------------------------------------------------------------------------  RADIOLOGY:  No results found.   ASSESSMENT AND PLAN:   71 year old female with history of chronic respiratory failure on 2-3 L of oxygen due to COPD and chronic  diastolic heart failure who presents from the skilled nursing facility with altered mental status and CO2 narcosis.  1. Acute hypoxic/hypercarbic respiratory failure in the setting of COPD and CHF exacerbation She was initiated on BiPAP and then high flow. She is now on her baseline nasal cannula at 3 L. She has no complaints of shortness of breath. CT of the chest did not show evidence of pulmonary emboli.  2. Acute on chronic diastolic heart failure with preserved ejection fraction:  Patient is currently  euvolemic.  She will be referred to CHF clinic.  Continue by mouth Lasix. [facility] cardiology consultation appreciated  3. Acute COPD exacerbation, mild: Patient has no wheezing on examination. Continue  prednisone and Levaquin through Tuesday.  4. Essential hypertension: Patient will resume outpatient medications 5. Hypothyroidism: She will continue Synthroid  6. Mild hypernatremia:This has resolved  7. OSA: She has BiPAP set up at skilled nursing facility. 8. Hypokalemia: This was repleted  Physical therapy is recommending skilled nursing facility  Management plans discussed with the patient and she is in agreement.  CODE STATUS: FULL  TOTAL TIME TAKING CARE OF THIS PATIENT: 22 minutes.     POSSIBLE D/C Monday, DEPENDING ON insurance approval  Angeline Trick M.D on 08/05/2016 at 8:11 AM  Between 7am to 6pm - Pager - [phone] After 6pm go to [email or web address] - password [email or web address] [place] [facility]  Office  [phone]  CC: Primary care physician; Adin Hector, MD  Note: This dictation was prepared with Dragon dictation along with smaller phrase technology. Any transcriptional errors that result from this process are unintentional.

## 2016-08-05 NOTE — Progress Notes (Signed)
Pt called out, requesting that bipap be turned off. Pt states that it is keeping her awake and that is too loud. Pt c/o blowing and beeping. Pt education provided concerning the importance of wearing bipap, pt verbalized understanding but continues to state that she just wants it off. Pt verbalized that she knows she will be extremely tired today but is unable to go back to sleep now. I continued to strongly suggest that she continue to wear it, even if only for a short period of time. Pt continued to refuse. Pt placed back on 3 liters oxygen via [place].

## 2016-08-05 NOTE — Progress Notes (Signed)
Pt requesting to take a shower. Pt advised that a shower requires a doctors order. Pt also has cardiac monitor ordered. Pt education provided concerning cardiac monitor and importance of not taking it off or getting it wet. Pt verbalized understanding.

## 2016-08-05 NOTE — Progress Notes (Signed)
eLink Physician-Brief Progress Note Patient Name: Hannah Powell DOB: 1945-08-31 MRN: [id number]   Date of Service  08/05/2016  HPI/Events of Note  Nurse requests order for Xanax 0.5 mg PO Q HS PRN. Patient states that Dr. Mortimer Fries spoke with her about this today. Review of Dr. Zoila Shutter progress note reveals no mention of Xanax. Spoke with Dr. Mortimer Fries, who requests order for Xanax 0.5 mg PO Q HS PRN.  eICU Interventions  Will order: 1. Xanax 0.5 mg PO Q HS PRN.      Intervention Category Intermediate Interventions: Other:  Lysle Dingwall 08/05/2016, 4:48 PM

## 2016-08-05 NOTE — Progress Notes (Signed)
   Name: Hannah Powell MRN: [id number] DOB: 12-Apr-1945    ADMISSION DATE:  08/01/2016 CONSULTATION DATE: 08/01/2016  REFERRING MD : Dr. Benjie Karvonen  CHIEF COMPLAINT:  Altered mental status  BRIEF PATIENT DESCRIPTION:  71 yo female admitted 05/16 with altered mental status and acute on chronic hypoxic hypercapnic respiratory failure secondary to AECOPD and CHF exacerbation requiring continuous Bipap  SIGNIFICANT EVENTS  05/16-Pt admitted to ICU   STUDIES:  CT Head 05/16>>Age related atrophy with minimal chronic small vessel ischemia. No acute intracranial abnormality. Fluid levels in the sphenoid sinuses and left maxillary sinus, may be secondary to recent intubation or sinusitis    Review of Systems:  Gen:  Denies  fever, sweats, chills weigh loss  +fatigue and tiredness  HEENT: Denies blurred vision, double vision, ear pain, eye pain, hearing loss, nose bleeds, sore throat  Cardiac:  No dizziness, chest pain or heaviness, chest tightness,edema  Resp:  + shortness of breath baseline  Other:  All other systems negative   SUBJECTIVE:  Feels better, WOB and SOB at baseline Needs biPAP as needed and while asleep to survive Given diamox yesterday, bicarb level is 43 Will give another IV dose again    VITAL SIGNS: Temp:  [98.1 F (36.7 C)-98.3 F (36.8 C)] 98.1 F (36.7 C) (05/20 0503) Pulse Rate:  [80-91] 87 (05/20 0503) Resp:  [18] 18 (05/19 2037) BP: (122-158)/(64-77) 132/69 (05/20 0503) SpO2:  [93 %-98 %] 98 % (05/20 0503) Weight:  [244 lb 2 oz (110.7 kg)] 244 lb 2 oz (110.7 kg) (05/20 0503)  PHYSICAL EXAMINATION: General: well developed, well nourished Caucasian female Neuro: follows commands, PERRL  HEENT: supple, No JVD Cardiovascular: nsr, s1s2, no M/R/G Lungs: diminished throughout, even, non labored  Abdomen: +BS x4, soft, obese, non tender, non distended  Musculoskeletal: 2+ bilateral lower extremity edema, normal tone Skin: intact no rashes or  lesions   Recent Labs Lab 08/03/16 0415 08/04/16 0540 08/04/16 1749 08/05/16 0520  NA 139 142  --  140  K 2.9* 2.6* 3.9 3.2*  CL 82* 85*  --  91*  CO2 48* 48*  --  43*  BUN 17 16  --  18  CREATININE 0.74 0.61  --  0.68  GLUCOSE 148* 91  --  104*    Recent Labs Lab 08/01/16 0149 08/02/16 0610 08/03/16 0415  HGB 12.4 10.6* 11.0*  HCT 38.6 32.6* 33.2*  WBC 5.0 5.4 5.9  PLT 143* 144* 143*   No results found.  ASSESSMENT / PLAN: Acute on Chronic Hypoxic Hypercapnic Respiratory Failure secondary to AECOPD and CHF exacerbation Maintain O2 sats 88% to 92%  Scheduled and prn bronchodilator therapy On oral prednisone Continue diuresis with diamox another dose again today and assess OUP and chem 7 Prn hydralazine for bp management BiPAP needed for sleep and as needed   Patient  satisfied with Plan of action and management. All questions answered  Corrin Parker, M.D.  [facility]  Medical Director [place] Director [facility] Cardio-Pulmonary Department

## 2016-08-06 DIAGNOSIS — I509 Heart failure, unspecified: Secondary | ICD-10-CM | POA: Diagnosis not present

## 2016-08-06 DIAGNOSIS — E039 Hypothyroidism, unspecified: Secondary | ICD-10-CM | POA: Diagnosis not present

## 2016-08-06 DIAGNOSIS — I2510 Atherosclerotic heart disease of native coronary artery without angina pectoris: Secondary | ICD-10-CM | POA: Diagnosis not present

## 2016-08-06 DIAGNOSIS — J9620 Acute and chronic respiratory failure, unspecified whether with hypoxia or hypercapnia: Secondary | ICD-10-CM | POA: Diagnosis not present

## 2016-08-06 DIAGNOSIS — J9621 Acute and chronic respiratory failure with hypoxia: Secondary | ICD-10-CM | POA: Diagnosis not present

## 2016-08-06 DIAGNOSIS — I110 Hypertensive heart disease with heart failure: Secondary | ICD-10-CM | POA: Diagnosis not present

## 2016-08-06 DIAGNOSIS — J9600 Acute respiratory failure, unspecified whether with hypoxia or hypercapnia: Secondary | ICD-10-CM | POA: Diagnosis not present

## 2016-08-06 DIAGNOSIS — E876 Hypokalemia: Secondary | ICD-10-CM | POA: Diagnosis not present

## 2016-08-06 DIAGNOSIS — M6281 Muscle weakness (generalized): Secondary | ICD-10-CM | POA: Diagnosis not present

## 2016-08-06 DIAGNOSIS — I10 Essential (primary) hypertension: Secondary | ICD-10-CM | POA: Diagnosis not present

## 2016-08-06 DIAGNOSIS — I5033 Acute on chronic diastolic (congestive) heart failure: Secondary | ICD-10-CM | POA: Diagnosis not present

## 2016-08-06 DIAGNOSIS — I5032 Chronic diastolic (congestive) heart failure: Secondary | ICD-10-CM | POA: Diagnosis not present

## 2016-08-06 DIAGNOSIS — J441 Chronic obstructive pulmonary disease with (acute) exacerbation: Secondary | ICD-10-CM | POA: Diagnosis not present

## 2016-08-06 DIAGNOSIS — J449 Chronic obstructive pulmonary disease, unspecified: Secondary | ICD-10-CM | POA: Diagnosis not present

## 2016-08-06 DIAGNOSIS — J9601 Acute respiratory failure with hypoxia: Secondary | ICD-10-CM | POA: Diagnosis not present

## 2016-08-06 DIAGNOSIS — D649 Anemia, unspecified: Secondary | ICD-10-CM | POA: Diagnosis not present

## 2016-08-06 DIAGNOSIS — J9690 Respiratory failure, unspecified, unspecified whether with hypoxia or hypercapnia: Secondary | ICD-10-CM | POA: Diagnosis not present

## 2016-08-06 DIAGNOSIS — G2581 Restless legs syndrome: Secondary | ICD-10-CM | POA: Diagnosis not present

## 2016-08-06 DIAGNOSIS — R600 Localized edema: Secondary | ICD-10-CM | POA: Diagnosis not present

## 2016-08-06 DIAGNOSIS — Z7401 Bed confinement status: Secondary | ICD-10-CM | POA: Diagnosis not present

## 2016-08-06 DIAGNOSIS — G4700 Insomnia, unspecified: Secondary | ICD-10-CM | POA: Diagnosis not present

## 2016-08-06 LAB — CULTURE, BLOOD (ROUTINE X 2)
Culture: NO GROWTH
Culture: NO GROWTH
Special Requests: ADEQUATE
Special Requests: ADEQUATE

## 2016-08-06 LAB — BASIC METABOLIC PANEL
ANION GAP: 3 — AB (ref 5–15)
BUN: 20 mg/dL (ref 6–20)
CALCIUM: 8.3 mg/dL — AB (ref 8.9–10.3)
CHLORIDE: 101 mmol/L (ref 101–111)
CO2: 36 mmol/L — AB (ref 22–32)
Creatinine, Ser: 0.51 mg/dL (ref 0.44–1.00)
GFR calc Af Amer: >60 mL/min (ref >60)
GFR calc non Af Amer: >60 mL/min (ref >60)
GLUCOSE: 104 mg/dL — AB (ref 65–99)
Potassium: 3.7 mmol/L (ref 3.5–5.1)
Sodium: 140 mmol/L (ref 135–145)

## 2016-08-06 MED ORDER — GUAIFENESIN 100 MG/5ML PO SOLN
5.0000 mL | ORAL | Status: DC | PRN
  Administered 2016-08-06: 09:00:00 100 mg via ORAL
  Filled 2016-08-06: qty 10

## 2016-08-06 MED ORDER — GUAIFENESIN 100 MG/5ML PO SOLN: 5.0000 mL | ORAL | 0 refills | Status: DC | PRN

## 2016-08-06 MED ORDER — DEXTROMETHORPHAN POLISTIREX ER 30 MG/5ML PO SUER: 15.0000 mg | Freq: Once | ORAL | Status: DC

## 2016-08-06 NOTE — Progress Notes (Signed)
Report called to [facility] and EMS contacted for transport.  Husband at bedside and aware.  Clarise Cruz, RN

## 2016-08-06 NOTE — Clinical Social Work Note (Signed)
Pt is ready for discharge today and will return to [facility]. Facility has obtained auth from [facility] and has received discharge information, therefore ready to accept pt. Pt and spouse are aware and agreeable to discharge plan. Rn will call report. [facility] will provide transportation. CSW is signing off as no further needs identified.   Darden Dates, MSW, LCSW  Clinical Social Worker  [phone]

## 2016-08-06 NOTE — Progress Notes (Signed)
Patient transported via EMS to [facility].  Clarise Cruz, RN

## 2016-08-06 NOTE — Care Management Important Message (Signed)
Important Message  Patient Details  Name: Hannah Powell MRN: [id number] Date of Birth: 11-02-1945   Medicare Important Message Given:  Yes    Shelbie Ammons, RN 08/06/2016, 8:22 AM

## 2016-08-07 ENCOUNTER — Inpatient Hospital Stay: Payer: Self-pay | Admitting: Internal Medicine

## 2016-08-07 LAB — BLOOD GAS, VENOUS
PATIENT TEMPERATURE: 37
PH VEN: 7.22 — AB (ref 7.250–7.430)
pCO2, Ven: 120 mmHg (ref 44.0–60.0)
pO2, Ven: 49 mmHg — ABNORMAL HIGH (ref 32.0–45.0)

## 2016-08-09 ENCOUNTER — Ambulatory Visit: Payer: Medicare HMO | Attending: Family | Admitting: Family

## 2016-08-09 ENCOUNTER — Ambulatory Visit: Payer: Self-pay | Admitting: Family

## 2016-08-09 ENCOUNTER — Encounter: Payer: Self-pay | Admitting: Family

## 2016-08-09 VITALS — BP 131/60 | HR 106 | Resp 20 | Ht 65.0 in | Wt 240.0 lb

## 2016-08-09 DIAGNOSIS — J449 Chronic obstructive pulmonary disease, unspecified: Secondary | ICD-10-CM | POA: Diagnosis not present

## 2016-08-09 DIAGNOSIS — I5032 Chronic diastolic (congestive) heart failure: Secondary | ICD-10-CM | POA: Insufficient documentation

## 2016-08-09 DIAGNOSIS — I10 Essential (primary) hypertension: Secondary | ICD-10-CM

## 2016-08-09 DIAGNOSIS — J410 Simple chronic bronchitis: Secondary | ICD-10-CM

## 2016-08-09 DIAGNOSIS — I110 Hypertensive heart disease with heart failure: Secondary | ICD-10-CM | POA: Insufficient documentation

## 2016-08-09 NOTE — Patient Instructions (Signed)
Continue weighing daily and call for an overnight weight gain of > 2 pounds or a weekly weight gain of >5 pounds. 

## 2016-08-09 NOTE — Progress Notes (Signed)
Patient ID: Hannah Powell, female    DOB: 1945/06/23, 71 y.o.   MRN: [id number]  HPI  [name] is a 71 y/o female with a history of anxiety, arthritis, COPD, HTN, hypothyroidism, obstructive sleep apnea, remote tobacco use and chronic heart failure.   Reviewed last echo done on 07/14/16 which showed an EF of 55-65% along with mild TR.   Admitted 08/01/16 with acute COPD/HF. Initially needed bipap and transitioned to nasal cannula at 3L. Chest CT obtained without pulmonary emboli. Given IV diuretics and then transitioned to oral diuretics. Cardiology consult obtained. Discharged with prednisone and antibiotics after 5 days.  Admitted 07/02/16 with acute on chronic hypercarbic respiratory failure along with severe sepsis. Had to be intubated and have a thoracentesis done. Was given bipap as well. IV antibiotics were used. Patient was discharged to SNF after 22 days. Was in the ED 06/30/16 due to a UTI. Treated and released.   She presents today for her initial visit with a chief complaint of mild shortness of breath with moderate exertion. She describes this as chronic in nature and has been present for several years with waxing/waning of severity. She wears oxygen which helps improve her breathing. She has associated fatigue, congestion, cough and leg edema.   Past Medical History:  Diagnosis Date  . Anxiety   . Arthritis   . CHF (congestive heart failure) ([place])   . COPD (chronic obstructive pulmonary disease) ([place])   . Hypertension    dr Otho Najjar     . Hypothyroidism   . RLS (restless legs syndrome)   . Shortness of breath   . Sleep apnea    cpap      >5 yrs   Past Surgical History:  Procedure Laterality Date  . BACK SURGERY     cervical  . BREAST BIOPSY Left    needle bx-neg  . FOOT ARTHROPLASTY    . JOINT REPLACEMENT     x2 tkr  . TOTAL SHOULDER ARTHROPLASTY Left 07/02/2013   Procedure: LEFT TOTAL SHOULDER ARTHROPLASTY;  Surgeon: Marin Shutter, MD;  Location: [facility];  Service:  Orthopedics;  Laterality: Left;   Family History  Problem Relation Age of Onset  . Hypertension Mother   . Diabetes Mother   . Heart failure Father   . Breast cancer Neg Hx    Social History  Substance Use Topics  . Smoking status: Former Smoker    Packs/day: 1.00    Years: 10.00    Quit date: 03/20/1975  . Smokeless tobacco: Never Used  . Alcohol use Yes     Comment: occ   Allergies  Allergen Reactions  . Lyrica [Pregabalin] Other (See Comments)    Reaction: made Restless Leg Syndrome worse    Prior to Admission medications   Medication Sig Start Date End Date Taking? Authorizing Provider  ALPRAZolam (XANAX) 0.25 MG tablet Take 1 tablet (0.25 mg total) by mouth 3 (three) times daily as needed for anxiety. 08/03/16  Yes Bettey Costa, MD  aspirin EC 81 MG tablet Take 81 mg by mouth daily.   Yes [provider]  B Complex-C (B-COMPLEX WITH VITAMIN C) tablet Take 1 tablet by mouth daily.   Yes [provider]  Calcium Carbonate-Vitamin D (CALCIUM-VITAMIN D) 500-200 MG-UNIT per tablet Take 1 tablet by mouth daily.   Yes [provider]  cyclobenzaprine (FLEXERIL) 10 MG tablet Take 0.5-1 mg by mouth 3 (three) times daily as needed for muscle spasms.   Yes [provider]  ferrous sulfate 325 (65 FE) MG tablet Take 325 mg by mouth daily with breakfast.   Yes [provider]  fluticasone furoate-vilanterol (BREO ELLIPTA) 200-25 MCG/INH AEPB Inhale 1 puff into the lungs daily.   Yes [provider]  furosemide (LASIX) 20 MG tablet Take 2 tablets (40 mg total) by mouth 2 (two) times daily. Patient taking differently: Take 80 mg by mouth daily.  08/03/16  Yes Mody, Ulice Bold, MD  gabapentin (NEURONTIN) 300 MG capsule Take 300 mg by mouth 2 (two) times daily.   Yes [provider]  guaiFENesin (ROBITUSSIN) 100 MG/5ML SOLN Take 5 mLs (100 mg total) by mouth every 4 (four) hours as needed for cough or to loosen phlegm. 08/06/16  Yes Mody,  Sital, MD  ipratropium-albuterol (DUONEB) 0.5-2.5 (3) MG/3ML SOLN Take 3 mLs by nebulization every 4 (four) hours as needed. 08/03/16  Yes Mody, Ulice Bold, MD  levothyroxine (SYNTHROID, LEVOTHROID) 200 MCG tablet Take 200 mcg by mouth daily before breakfast.   Yes [provider]  losartan (COZAAR) 25 MG tablet Take 25 mg by mouth daily.   Yes [provider]  nystatin (NYSTATIN) powder Apply 1 g topically 2 (two) times daily.   Yes [provider]  Omega-3 Fatty Acids (OMEGA 3 PO) Take 1 capsule by mouth daily.   Yes [provider]  oxybutynin (DITROPAN) 5 MG tablet Take 5 mg by mouth 3 (three) times daily.   Yes [provider]  potassium chloride SA (K-DUR,KLOR-CON) 20 MEQ tablet Take 20 mEq by mouth daily.    Yes [provider]  pramipexole (MIRAPEX) 1 MG tablet Take 4 mg by mouth at bedtime.    Yes [provider]  roflumilast (DALIRESP) 500 MCG TABS tablet Take 1 tablet (500 mcg total) by mouth daily. 02/16/16  Yes Flora Lipps, MD  traMADol (ULTRAM) 50 MG tablet Take 1 tablet (50 mg total) by mouth every 8 (eight) hours as needed. 08/03/16  Yes Mody, Ulice Bold, MD  zolpidem (AMBIEN) 10 MG tablet Take 5 mg by mouth at bedtime as needed for sleep.   Yes [provider]   Review of Systems  Constitutional: Positive for fatigue (improving). Negative for appetite change.  HENT: Positive for congestion. Negative for postnasal drip and sore throat.   Eyes: Negative.   Respiratory: Positive for cough and shortness of breath. Negative for chest tightness.   Cardiovascular: Positive for leg swelling. Negative for chest pain and palpitations.  Gastrointestinal: Negative for abdominal distention and abdominal pain.  Endocrine: Negative.   Genitourinary: Negative.   Musculoskeletal: Negative for back pain and neck pain.  Skin: Negative.   Allergic/Immunologic: Negative.   Neurological: Negative for dizziness and light-headedness.   Hematological: Negative for adenopathy. Bruises/bleeds easily.  Psychiatric/Behavioral: Positive for sleep disturbance (wearing bipap and oxygen @3L  at night). Negative for dysphoric mood. The patient is not nervous/anxious.    Vitals:   08/09/16 1211  BP: 131/60  Pulse: (!) 106  Resp: 20  SpO2: 96%  Weight: 240 lb (108.9 kg)  Height: 5\' 5"  (1.651 m)   Wt Readings from Last 3 Encounters:  08/09/16 240 lb (108.9 kg)  08/06/16 242 lb 3.2 oz (109.9 kg)  07/23/16 252 lb (114.3 kg)   Lab Results  Component Value Date   CREATININE 0.51 08/06/2016   CREATININE 0.68 08/05/2016   CREATININE 0.61 08/04/2016   Physical Exam  Constitutional: She is oriented to person, place, and time. She appears well-developed and well-nourished.  HENT:  Head: Normocephalic and atraumatic.  Neck: Normal range of motion. Neck supple. No JVD present.  Cardiovascular: Regular rhythm.  Tachycardia present.   Pulmonary/Chest: Effort normal. She has no wheezes. She has no rales.  Abdominal: Soft. She exhibits no distension. There is no tenderness.  Musculoskeletal: She exhibits edema (2+ pitting edema in bilateral lower legs with L>R). She exhibits no tenderness.  Neurological: She is alert and oriented to person, place, and time.  Skin: Skin is warm and dry.  Psychiatric: She has a normal mood and affect. Her behavior is normal.  Nursing note and vitals reviewed.     Assessment & Plan:  1: Chronic heart failure with preserved ejection fraction- - NYHA class II - mild fluid overloaded today with lower extremity edema - unsure of weight at facility but says that she is getting weighed daily at [facility]. She is hoping to get home within the next month. Discussed the importance of weighing daily at home and calling for an overnight weight gain of >2 pounds or a weekly weight gain of >5 pounds - does use "lite salt" and she was encouraged to not add any salt to her food. Discussed the importance of closely  following a 2000mg  sodium diet and written information was given to her about this.  - doing daily PT for 30-45 minutes at a time - does have compression socks but doesn't wear them much and she was encouraged to wear them daily along with elevating her legs when sitting - saw cardiologist Nehemiah Massed) during admission but currently doesn't have a follow-up appointment scheduled. Offered to schedule it for patient but she says that she'll call herself to schedule it - may need toprol xl for tachycardia  2: HTN- - BP looks good today - BMP drawn on 08/06/16 shows sodium 140, potassium 3.7 and GFR >60 - patient says that lab work was drawn today at [facility]. Will call and ask for a copy to be sent to [place]  3: COPD- - wearing oxygen at 3L around the clock - wearing bipap at bedtime - saw pulmonologist Mortimer Fries) December 2017  Patient did not bring her medications nor a list. Each medication was verbally reviewed with the patient and she was encouraged to bring the bottles to every visit to confirm accuracy of list.  Return in 1 month or sooner for any questions/problems before then.

## 2016-08-11 DIAGNOSIS — I5032 Chronic diastolic (congestive) heart failure: Secondary | ICD-10-CM | POA: Diagnosis not present

## 2016-08-11 DIAGNOSIS — G4700 Insomnia, unspecified: Secondary | ICD-10-CM | POA: Diagnosis not present

## 2016-08-11 DIAGNOSIS — G2581 Restless legs syndrome: Secondary | ICD-10-CM | POA: Diagnosis not present

## 2016-08-11 DIAGNOSIS — M6281 Muscle weakness (generalized): Secondary | ICD-10-CM | POA: Diagnosis not present

## 2016-08-11 DIAGNOSIS — J9601 Acute respiratory failure with hypoxia: Secondary | ICD-10-CM | POA: Diagnosis not present

## 2016-08-11 DIAGNOSIS — J441 Chronic obstructive pulmonary disease with (acute) exacerbation: Secondary | ICD-10-CM | POA: Diagnosis not present

## 2016-08-11 DIAGNOSIS — E039 Hypothyroidism, unspecified: Secondary | ICD-10-CM | POA: Diagnosis not present

## 2016-08-14 ENCOUNTER — Telehealth: Payer: Self-pay | Admitting: Internal Medicine

## 2016-08-14 NOTE — Telephone Encounter (Signed)
LMOM for pt to return call. 

## 2016-08-14 NOTE — Telephone Encounter (Signed)
Spoke with pt and she is asking if she needs a sleep study since she went home with BiPAP. Will call [facility] and discuss what pt needs to have done. Hosp f/u appt scheduled.   Spoke with Magda Paganini at [facility] and she states if pt does the Trilogy machine we can use the CO2 numbers from last admission otherwise if pt does BiPAP she will have to do a sleep study. States she will contact pt and facility to have notes documented to what pt is using at the rehab at this time. Pt has been scheduled for 09/07/16 with [facility] for a hosp f/u appt.  Nothing further needed.

## 2016-08-14 NOTE — Telephone Encounter (Signed)
Pt needs to discuss a sleep machine and possible sleep study.

## 2016-08-16 ENCOUNTER — Other Ambulatory Visit: Payer: Self-pay | Admitting: *Deleted

## 2016-08-16 ENCOUNTER — Telehealth: Payer: Self-pay | Admitting: Internal Medicine

## 2016-08-16 DIAGNOSIS — J9601 Acute respiratory failure with hypoxia: Secondary | ICD-10-CM | POA: Diagnosis not present

## 2016-08-16 DIAGNOSIS — E039 Hypothyroidism, unspecified: Secondary | ICD-10-CM | POA: Diagnosis not present

## 2016-08-16 DIAGNOSIS — D649 Anemia, unspecified: Secondary | ICD-10-CM | POA: Diagnosis not present

## 2016-08-16 DIAGNOSIS — R600 Localized edema: Secondary | ICD-10-CM | POA: Diagnosis not present

## 2016-08-16 DIAGNOSIS — J441 Chronic obstructive pulmonary disease with (acute) exacerbation: Secondary | ICD-10-CM | POA: Diagnosis not present

## 2016-08-16 DIAGNOSIS — I5032 Chronic diastolic (congestive) heart failure: Secondary | ICD-10-CM | POA: Diagnosis not present

## 2016-08-16 DIAGNOSIS — M6281 Muscle weakness (generalized): Secondary | ICD-10-CM | POA: Diagnosis not present

## 2016-08-16 DIAGNOSIS — G4700 Insomnia, unspecified: Secondary | ICD-10-CM | POA: Diagnosis not present

## 2016-08-16 DIAGNOSIS — G2581 Restless legs syndrome: Secondary | ICD-10-CM | POA: Diagnosis not present

## 2016-08-16 NOTE — Telephone Encounter (Signed)
Spoke with pt and she is concerned that when she gets d/c'd from rehab what is she going to do about a BiPAP. Informed pt [facility] was going to call her which she states they did. Informed pt that when she gets ready to be d/c'd from rehab facility to have them to contact [facility] about getting a temporary bipap until her appt with [facility] on 09/05/16. Pt informed again that unless she has a sleep study she will not be able to get a bipap for home use. By using the hospital information she can get a triology without a sleep study. Informed that when she comes in for her appt that her and [facility] can discuss the best option for her. Informed pt that if she gets d/c' before 09/05/16 to call back for a sooner appt. Pt verbalized understanding. Nothing further needed.

## 2016-08-16 NOTE — Telephone Encounter (Signed)
Pt calling stating she was in ICU and wanted a call back about possibly getting a breathing machine for her home Please advise

## 2016-08-16 NOTE — Patient Outreach (Signed)
[facility]) Care Management  08/16/2016  Hannah Powell February 25, 1946 [id number]   Met with patient at bedside of facility.  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 She has history of COPD and HF.  Patient married and states spouse is supportive.   RNCM reviewed [facility] care management services, Gave brochure and magnet. Patient does not feel she needs services at this time, but will call with future needs.   Plan to sign off at this time refusing [facility] services at this time.  Royetta Crochet. Laymond Purser, RN, BSN, [place] [phone]) Business Cell  [phone]) Toll Free Office

## 2016-08-16 NOTE — Telephone Encounter (Signed)
Spoke with Melissa with [facility] and she states they have pt on a BiPAP at the rehab facility and that she will check to see if pt needs a sleep study to get the BiPAP vs Triology machine for home use. Will wait to hear from [facility].

## 2016-08-17 NOTE — Telephone Encounter (Signed)
R/T call to patient and left detailed message. We are awaiting further information from [facility]. We will give patient a call once we receive update.

## 2016-08-17 NOTE — Telephone Encounter (Signed)
Pt calling asking if we call her back she has to talk to nurse about her BiPAP machine

## 2016-08-20 ENCOUNTER — Other Ambulatory Visit: Payer: Self-pay | Admitting: *Deleted

## 2016-08-20 ENCOUNTER — Encounter: Payer: Self-pay | Admitting: Internal Medicine

## 2016-08-20 ENCOUNTER — Telehealth: Payer: Self-pay | Admitting: Internal Medicine

## 2016-08-20 DIAGNOSIS — G4733 Obstructive sleep apnea (adult) (pediatric): Secondary | ICD-10-CM

## 2016-08-20 DIAGNOSIS — R0689 Other abnormalities of breathing: Secondary | ICD-10-CM

## 2016-08-20 NOTE — Progress Notes (Addendum)
Patient with End Stage COPD/CHF and OSA with chronic hypoxic respiratory failure and will need BIPAP in conjunction with oxygen Therapy. She will need this intervention to survive and to prevent further admissions to the hospital.  She has been hospitalized recently for severe respiratory failure and was discharged to skilled nursing facility and will be discharged today 08/20/2016 due to insurance. Patient was using CPAP and due to progressive respiratory failure, patient will need BiPAP to survive.  Her settings that she will need is BIPAP with settings of  IPAP 15 and EPAP 8, with bleed in 3 L oxygen supplementation with Back up PS at 10 cm H20.   ONO on oxygen only can NOT be performed due to the fact that patient can NOT be off BIPAP to due this could be life threatening.   Corrin Parker, M.D.  [facility]  Medical Director [place] Director [facility] Cardio-Pulmonary Department

## 2016-08-20 NOTE — Telephone Encounter (Signed)
Advised patient that we are working on getting her bipap machine. We will let her know status when we find out final outcome.

## 2016-08-20 NOTE — Telephone Encounter (Signed)
Patient has quick question about cpap   Please call

## 2016-08-20 NOTE — Progress Notes (Signed)
Order placed for BiPAP per [facility].

## 2016-08-21 ENCOUNTER — Ambulatory Visit (INDEPENDENT_AMBULATORY_CARE_PROVIDER_SITE_OTHER): Payer: Medicare HMO | Admitting: Internal Medicine

## 2016-08-21 ENCOUNTER — Encounter: Payer: Self-pay | Admitting: Internal Medicine

## 2016-08-21 VITALS — BP 118/68 | HR 104 | Ht 65.0 in | Wt 239.0 lb

## 2016-08-21 DIAGNOSIS — J9611 Chronic respiratory failure with hypoxia: Secondary | ICD-10-CM

## 2016-08-21 MED ORDER — AZITHROMYCIN 250 MG PO TABS: 250.0000 mg | ORAL_TABLET | Freq: Every day | ORAL | 3 refills | Status: DC

## 2016-08-21 MED ORDER — FLUTICASONE FUROATE-VILANTEROL 200-25 MCG/INH IN AEPB: 1.0000 | INHALATION_SPRAY | Freq: Every day | RESPIRATORY_TRACT | 0 refills | Status: AC

## 2016-08-21 MED ORDER — FLUTICASONE FUROATE-VILANTEROL 200-25 MCG/INH IN AEPB: 1.0000 | INHALATION_SPRAY | Freq: Every day | RESPIRATORY_TRACT | 0 refills | Status: DC

## 2016-08-21 NOTE — Progress Notes (Signed)
Hannah Powell     Date: 08/21/2016,   MRN# [id number] Hannah Powell 03-05-1946 Code Status:  Code Status History    Date Active Date Inactive Code Status Order ID Comments User Context   07/18/2015  1:24 PM 07/22/2015  3:51 PM Full Code [id number]  Flora Lipps, MD ED   07/02/2013 11:24 AM 07/03/2013  1:35 PM Full Code [id number]  Jenetta Loges, PA-C Inpatient     Hosp day:@LENGTHOFSTAYDAYS @ Referring MD: @ATDPROV @     PCP:      AdmissionWeight: 239 lb (108.4 kg)                 CurrentWeight: 239 lb (108.4 kg)  CHIEF COMPLAINT:   Follow up COPD   HISTORY OF PRESENT ILLNESS  Patient has had long ICU LOS and multiple admissions since last visit Patient with end stage lung disease Now on chronic biPAP Patient is remote smoker, quit 1977 ONO results positive for hypoxia  PFT 09/13/15 Ratio 72% Fev1 52% Fef25/75 42%  Assessment-mod/severe obstructive airways disease End stage hypoxic and hypercapnic resp failure  Home Medication:   Current Medication:   Current Outpatient Prescriptions:  .  ALPRAZolam (XANAX) 0.25 MG tablet, Take 1 tablet (0.25 mg total) by mouth 3 (three) times daily as needed for anxiety., Disp: 30 tablet, Rfl: 0 .  aspirin EC 81 MG tablet, Take 81 mg by mouth daily., Disp: , Rfl:  .  B Complex-C (B-COMPLEX WITH VITAMIN C) tablet, Take 1 tablet by mouth daily., Disp: , Rfl:  .  Calcium Carbonate-Vitamin D (CALCIUM-VITAMIN D) 500-200 MG-UNIT per tablet, Take 1 tablet by mouth daily., Disp: , Rfl:  .  cyclobenzaprine (FLEXERIL) 10 MG tablet, Take 0.5-1 mg by mouth 3 (three) times daily as needed for muscle spasms., Disp: , Rfl:  .  ferrous sulfate 325 (65 FE) MG tablet, Take 325 mg by mouth daily with breakfast., Disp: , Rfl:  .  fluticasone furoate-vilanterol (BREO ELLIPTA) 200-25 MCG/INH AEPB, Inhale 1 puff into the lungs daily., Disp: , Rfl:  .  furosemide (LASIX) 20 MG tablet, Take 2 tablets (40 mg total) by mouth 2 (two)  times daily. (Patient taking differently: Take 80 mg by mouth daily. ), Disp: , Rfl:  .  gabapentin (NEURONTIN) 300 MG capsule, Take 300 mg by mouth 2 (two) times daily., Disp: , Rfl:  .  ipratropium-albuterol (DUONEB) 0.5-2.5 (3) MG/3ML SOLN, Take 3 mLs by nebulization every 4 (four) hours as needed., Disp: , Rfl:  .  levothyroxine (SYNTHROID, LEVOTHROID) 200 MCG tablet, Take 200 mcg by mouth daily before breakfast., Disp: , Rfl:  .  losartan (COZAAR) 25 MG tablet, Take 25 mg by mouth daily., Disp: , Rfl:  .  nystatin (NYSTATIN) powder, Apply 1 g topically 2 (two) times daily., Disp: , Rfl:  .  Omega-3 Fatty Acids (OMEGA 3 PO), Take 1 capsule by mouth daily., Disp: , Rfl:  .  oxybutynin (DITROPAN) 5 MG tablet, Take 5 mg by mouth 3 (three) times daily., Disp: , Rfl:  .  potassium chloride SA (K-DUR,KLOR-CON) 20 MEQ tablet, Take 20 mEq by mouth 2 (two) times daily. , Disp: , Rfl:  .  pramipexole (MIRAPEX) 1 MG tablet, Take 4 mg by mouth at bedtime. , Disp: , Rfl:  .  roflumilast (DALIRESP) 500 MCG TABS tablet, Take 1 tablet (500 mcg total) by mouth daily., Disp: 90 tablet, Rfl: 3 .  traMADol (ULTRAM) 50 MG tablet, Take 1 tablet (50 mg total) by mouth  every 8 (eight) hours as needed., Disp: 30 tablet, Rfl: 0 .  zolpidem (AMBIEN) 10 MG tablet, Take 5 mg by mouth at bedtime as needed for sleep., Disp: , Rfl:      ALLERGIES   Lyrica [pregabalin]     REVIEW OF SYSTEMS   Review of Systems  Constitutional: Negative for chills, diaphoresis, fever, malaise/fatigue and weight loss.  HENT: Negative for congestion.   Eyes: Negative for blurred vision and double vision.  Respiratory: Positive for shortness of breath. Negative for cough, hemoptysis, sputum production and wheezing.   Cardiovascular: Positive for leg swelling. Negative for chest pain, palpitations and orthopnea.  Gastrointestinal: Negative for abdominal pain, heartburn, nausea and vomiting.  Neurological: Negative for weakness.  All  other systems reviewed and are negative.    VS: BP 118/68 (Cuff Size: Normal)   Pulse (!) 104   Ht 5\' 5"  (1.651 m)   Wt 239 lb (108.4 kg)   SpO2 93%   BMI 39.77 kg/m      PHYSICAL EXAM   Physical Exam  Constitutional: She is oriented to person, place, and time. She appears well-developed and well-nourished. No distress.  HENT:  Mouth/Throat: No oropharyngeal exudate.  Cardiovascular: Normal rate, regular rhythm and normal heart sounds.   No murmur heard. Pulmonary/Chest: Effort normal. No stridor. No respiratory distress. She has no wheezes. She has rales.  Musculoskeletal: Normal range of motion. She exhibits edema.  Neurological: She is alert and oriented to person, place, and time. No cranial nerve deficit.  Skin: Skin is warm. She is not diaphoretic.  Psychiatric: She has a normal mood and affect.         ASSESSMENT/PLAN   71 yo white female with Moderate/Severe COPD Grade D with obesity and underlying OSA with nocturnal hypoxia with chronic resp failure with recurrent bouts of COPD exacerbation and multiple prednisone therapies.  Patient with severe end stage lung disease now on chronic biPAP therapy and 3L of oxygen therapy   Resp failure-hypoxia and hypercapnic -continue BiPAP for chronic hypoxic and hypercapnic resp failure -oxygen needed 24/7  COPD-moderate/severe Gold Stage D -continue inhaled steroids/LABA(BREO) -daliresp is too expensive-will switch to daily azithromycin therapy for COPD exacerbation prevention -dounebs every 4 hrs as needed  Morbid Obesity Unable to lose weight Prognosis is poor  OSA/COPD-needs biPAP for Survival   5.continue  Daliresp to prevent COPD exacerbations  Follow up in 1 month   Patient/Family are satisfied with Plan of action and management. All questions answered Prognosis is poor and guarded  Corrin Parker, M.D.  [facility]  Medical Director [place]  Director [facility] Cardio-Pulmonary Department

## 2016-08-21 NOTE — Patient Instructions (Signed)
Stop [employer] Zithromycin 250 mg daily

## 2016-08-22 DIAGNOSIS — H1132 Conjunctival hemorrhage, left eye: Secondary | ICD-10-CM | POA: Diagnosis not present

## 2016-08-22 DIAGNOSIS — G4733 Obstructive sleep apnea (adult) (pediatric): Secondary | ICD-10-CM | POA: Diagnosis not present

## 2016-08-22 DIAGNOSIS — I10 Essential (primary) hypertension: Secondary | ICD-10-CM | POA: Diagnosis not present

## 2016-08-22 DIAGNOSIS — I5032 Chronic diastolic (congestive) heart failure: Secondary | ICD-10-CM | POA: Diagnosis not present

## 2016-08-22 DIAGNOSIS — J9611 Chronic respiratory failure with hypoxia: Secondary | ICD-10-CM | POA: Diagnosis not present

## 2016-08-23 DIAGNOSIS — J441 Chronic obstructive pulmonary disease with (acute) exacerbation: Secondary | ICD-10-CM | POA: Diagnosis not present

## 2016-08-23 DIAGNOSIS — I5031 Acute diastolic (congestive) heart failure: Secondary | ICD-10-CM | POA: Diagnosis not present

## 2016-08-23 DIAGNOSIS — I2510 Atherosclerotic heart disease of native coronary artery without angina pectoris: Secondary | ICD-10-CM | POA: Diagnosis not present

## 2016-08-23 DIAGNOSIS — Z8701 Personal history of pneumonia (recurrent): Secondary | ICD-10-CM | POA: Diagnosis not present

## 2016-08-23 DIAGNOSIS — I110 Hypertensive heart disease with heart failure: Secondary | ICD-10-CM | POA: Diagnosis not present

## 2016-08-23 DIAGNOSIS — Z87440 Personal history of urinary (tract) infections: Secondary | ICD-10-CM | POA: Diagnosis not present

## 2016-08-24 ENCOUNTER — Telehealth: Payer: Self-pay | Admitting: Internal Medicine

## 2016-08-24 DIAGNOSIS — I5032 Chronic diastolic (congestive) heart failure: Secondary | ICD-10-CM | POA: Diagnosis not present

## 2016-08-24 DIAGNOSIS — J9611 Chronic respiratory failure with hypoxia: Secondary | ICD-10-CM | POA: Diagnosis not present

## 2016-08-24 NOTE — Telephone Encounter (Signed)
Pt calling asking if we can call her back  Has some questions on her lasix medication  Please call back

## 2016-08-24 NOTE — Telephone Encounter (Signed)
Called pt and she states she had called the wrong office in regards to Lasix. States she has already called her PCP. Nothing further needed.

## 2016-08-27 DIAGNOSIS — I110 Hypertensive heart disease with heart failure: Secondary | ICD-10-CM | POA: Diagnosis not present

## 2016-08-27 DIAGNOSIS — J441 Chronic obstructive pulmonary disease with (acute) exacerbation: Secondary | ICD-10-CM | POA: Diagnosis not present

## 2016-08-27 DIAGNOSIS — I5031 Acute diastolic (congestive) heart failure: Secondary | ICD-10-CM | POA: Diagnosis not present

## 2016-08-27 DIAGNOSIS — Z87440 Personal history of urinary (tract) infections: Secondary | ICD-10-CM | POA: Diagnosis not present

## 2016-08-27 DIAGNOSIS — I2510 Atherosclerotic heart disease of native coronary artery without angina pectoris: Secondary | ICD-10-CM | POA: Diagnosis not present

## 2016-08-27 DIAGNOSIS — Z8701 Personal history of pneumonia (recurrent): Secondary | ICD-10-CM | POA: Diagnosis not present

## 2016-08-28 ENCOUNTER — Telehealth: Payer: Self-pay | Admitting: Internal Medicine

## 2016-08-28 NOTE — Telephone Encounter (Signed)
LMTCB and let us know if she would like rx sent as we do not have incentive spirometer in office.

## 2016-08-28 NOTE — Telephone Encounter (Signed)
Pt would like to know if you have incentive spirometer. Please call and advise.

## 2016-08-28 NOTE — Telephone Encounter (Signed)
Returned call to patient, she does not want to pay for incentive spirometer. She does not want [place] to sent rx. She will see if she can get one from hospital. Nothing further needed.

## 2016-08-29 DIAGNOSIS — I5031 Acute diastolic (congestive) heart failure: Secondary | ICD-10-CM | POA: Diagnosis not present

## 2016-08-29 DIAGNOSIS — Z87440 Personal history of urinary (tract) infections: Secondary | ICD-10-CM | POA: Diagnosis not present

## 2016-08-29 DIAGNOSIS — Z8701 Personal history of pneumonia (recurrent): Secondary | ICD-10-CM | POA: Diagnosis not present

## 2016-08-29 DIAGNOSIS — I110 Hypertensive heart disease with heart failure: Secondary | ICD-10-CM | POA: Diagnosis not present

## 2016-08-29 DIAGNOSIS — I2510 Atherosclerotic heart disease of native coronary artery without angina pectoris: Secondary | ICD-10-CM | POA: Diagnosis not present

## 2016-08-29 DIAGNOSIS — J441 Chronic obstructive pulmonary disease with (acute) exacerbation: Secondary | ICD-10-CM | POA: Diagnosis not present

## 2016-08-31 DIAGNOSIS — Z8701 Personal history of pneumonia (recurrent): Secondary | ICD-10-CM | POA: Diagnosis not present

## 2016-08-31 DIAGNOSIS — J441 Chronic obstructive pulmonary disease with (acute) exacerbation: Secondary | ICD-10-CM | POA: Diagnosis not present

## 2016-08-31 DIAGNOSIS — I5031 Acute diastolic (congestive) heart failure: Secondary | ICD-10-CM | POA: Diagnosis not present

## 2016-08-31 DIAGNOSIS — I110 Hypertensive heart disease with heart failure: Secondary | ICD-10-CM | POA: Diagnosis not present

## 2016-08-31 DIAGNOSIS — Z87440 Personal history of urinary (tract) infections: Secondary | ICD-10-CM | POA: Diagnosis not present

## 2016-08-31 DIAGNOSIS — I2510 Atherosclerotic heart disease of native coronary artery without angina pectoris: Secondary | ICD-10-CM | POA: Diagnosis not present

## 2016-09-02 DIAGNOSIS — R0689 Other abnormalities of breathing: Secondary | ICD-10-CM | POA: Diagnosis not present

## 2016-09-02 DIAGNOSIS — I509 Heart failure, unspecified: Secondary | ICD-10-CM | POA: Diagnosis not present

## 2016-09-02 DIAGNOSIS — J449 Chronic obstructive pulmonary disease, unspecified: Secondary | ICD-10-CM | POA: Diagnosis not present

## 2016-09-02 DIAGNOSIS — R0902 Hypoxemia: Secondary | ICD-10-CM | POA: Diagnosis not present

## 2016-09-03 DIAGNOSIS — I2510 Atherosclerotic heart disease of native coronary artery without angina pectoris: Secondary | ICD-10-CM | POA: Diagnosis not present

## 2016-09-03 DIAGNOSIS — I5031 Acute diastolic (congestive) heart failure: Secondary | ICD-10-CM | POA: Diagnosis not present

## 2016-09-03 DIAGNOSIS — J441 Chronic obstructive pulmonary disease with (acute) exacerbation: Secondary | ICD-10-CM | POA: Diagnosis not present

## 2016-09-03 DIAGNOSIS — I110 Hypertensive heart disease with heart failure: Secondary | ICD-10-CM | POA: Diagnosis not present

## 2016-09-03 DIAGNOSIS — Z87440 Personal history of urinary (tract) infections: Secondary | ICD-10-CM | POA: Diagnosis not present

## 2016-09-03 DIAGNOSIS — Z8701 Personal history of pneumonia (recurrent): Secondary | ICD-10-CM | POA: Diagnosis not present

## 2016-09-04 DIAGNOSIS — J441 Chronic obstructive pulmonary disease with (acute) exacerbation: Secondary | ICD-10-CM | POA: Diagnosis not present

## 2016-09-04 DIAGNOSIS — I5031 Acute diastolic (congestive) heart failure: Secondary | ICD-10-CM | POA: Diagnosis not present

## 2016-09-04 DIAGNOSIS — I2510 Atherosclerotic heart disease of native coronary artery without angina pectoris: Secondary | ICD-10-CM | POA: Diagnosis not present

## 2016-09-04 DIAGNOSIS — Z8701 Personal history of pneumonia (recurrent): Secondary | ICD-10-CM | POA: Diagnosis not present

## 2016-09-04 DIAGNOSIS — I110 Hypertensive heart disease with heart failure: Secondary | ICD-10-CM | POA: Diagnosis not present

## 2016-09-04 DIAGNOSIS — Z87440 Personal history of urinary (tract) infections: Secondary | ICD-10-CM | POA: Diagnosis not present

## 2016-09-05 DIAGNOSIS — Z8701 Personal history of pneumonia (recurrent): Secondary | ICD-10-CM | POA: Diagnosis not present

## 2016-09-05 DIAGNOSIS — J441 Chronic obstructive pulmonary disease with (acute) exacerbation: Secondary | ICD-10-CM | POA: Diagnosis not present

## 2016-09-05 DIAGNOSIS — I2510 Atherosclerotic heart disease of native coronary artery without angina pectoris: Secondary | ICD-10-CM | POA: Diagnosis not present

## 2016-09-05 DIAGNOSIS — Z87440 Personal history of urinary (tract) infections: Secondary | ICD-10-CM | POA: Diagnosis not present

## 2016-09-05 DIAGNOSIS — I5031 Acute diastolic (congestive) heart failure: Secondary | ICD-10-CM | POA: Diagnosis not present

## 2016-09-05 DIAGNOSIS — I110 Hypertensive heart disease with heart failure: Secondary | ICD-10-CM | POA: Diagnosis not present

## 2016-09-06 DIAGNOSIS — J441 Chronic obstructive pulmonary disease with (acute) exacerbation: Secondary | ICD-10-CM | POA: Diagnosis not present

## 2016-09-06 DIAGNOSIS — Z87440 Personal history of urinary (tract) infections: Secondary | ICD-10-CM | POA: Diagnosis not present

## 2016-09-06 DIAGNOSIS — I5031 Acute diastolic (congestive) heart failure: Secondary | ICD-10-CM | POA: Diagnosis not present

## 2016-09-06 DIAGNOSIS — Z8701 Personal history of pneumonia (recurrent): Secondary | ICD-10-CM | POA: Diagnosis not present

## 2016-09-06 DIAGNOSIS — I110 Hypertensive heart disease with heart failure: Secondary | ICD-10-CM | POA: Diagnosis not present

## 2016-09-06 DIAGNOSIS — I2510 Atherosclerotic heart disease of native coronary artery without angina pectoris: Secondary | ICD-10-CM | POA: Diagnosis not present

## 2016-09-07 ENCOUNTER — Inpatient Hospital Stay: Payer: Self-pay | Admitting: Internal Medicine

## 2016-09-07 ENCOUNTER — Other Ambulatory Visit: Payer: Self-pay | Admitting: Internal Medicine

## 2016-09-07 DIAGNOSIS — R600 Localized edema: Secondary | ICD-10-CM

## 2016-09-07 DIAGNOSIS — J438 Other emphysema: Secondary | ICD-10-CM | POA: Diagnosis not present

## 2016-09-07 DIAGNOSIS — J9611 Chronic respiratory failure with hypoxia: Secondary | ICD-10-CM | POA: Diagnosis not present

## 2016-09-07 DIAGNOSIS — I5032 Chronic diastolic (congestive) heart failure: Secondary | ICD-10-CM | POA: Diagnosis not present

## 2016-09-07 DIAGNOSIS — M4696 Unspecified inflammatory spondylopathy, lumbar region: Secondary | ICD-10-CM | POA: Diagnosis not present

## 2016-09-07 DIAGNOSIS — I10 Essential (primary) hypertension: Secondary | ICD-10-CM | POA: Diagnosis not present

## 2016-09-07 DIAGNOSIS — G4733 Obstructive sleep apnea (adult) (pediatric): Secondary | ICD-10-CM | POA: Diagnosis not present

## 2016-09-07 DIAGNOSIS — E034 Atrophy of thyroid (acquired): Secondary | ICD-10-CM | POA: Diagnosis not present

## 2016-09-07 DIAGNOSIS — E7800 Pure hypercholesterolemia, unspecified: Secondary | ICD-10-CM | POA: Diagnosis not present

## 2016-09-07 DIAGNOSIS — G63 Polyneuropathy in diseases classified elsewhere: Secondary | ICD-10-CM | POA: Diagnosis not present

## 2016-09-10 DIAGNOSIS — Z87440 Personal history of urinary (tract) infections: Secondary | ICD-10-CM | POA: Diagnosis not present

## 2016-09-10 DIAGNOSIS — I2510 Atherosclerotic heart disease of native coronary artery without angina pectoris: Secondary | ICD-10-CM | POA: Diagnosis not present

## 2016-09-10 DIAGNOSIS — J441 Chronic obstructive pulmonary disease with (acute) exacerbation: Secondary | ICD-10-CM | POA: Diagnosis not present

## 2016-09-10 DIAGNOSIS — I5031 Acute diastolic (congestive) heart failure: Secondary | ICD-10-CM | POA: Diagnosis not present

## 2016-09-10 DIAGNOSIS — Z8701 Personal history of pneumonia (recurrent): Secondary | ICD-10-CM | POA: Diagnosis not present

## 2016-09-10 DIAGNOSIS — I110 Hypertensive heart disease with heart failure: Secondary | ICD-10-CM | POA: Diagnosis not present

## 2016-09-12 DIAGNOSIS — J441 Chronic obstructive pulmonary disease with (acute) exacerbation: Secondary | ICD-10-CM | POA: Diagnosis not present

## 2016-09-12 DIAGNOSIS — I110 Hypertensive heart disease with heart failure: Secondary | ICD-10-CM | POA: Diagnosis not present

## 2016-09-12 DIAGNOSIS — I5031 Acute diastolic (congestive) heart failure: Secondary | ICD-10-CM | POA: Diagnosis not present

## 2016-09-12 DIAGNOSIS — I2510 Atherosclerotic heart disease of native coronary artery without angina pectoris: Secondary | ICD-10-CM | POA: Diagnosis not present

## 2016-09-12 DIAGNOSIS — Z8701 Personal history of pneumonia (recurrent): Secondary | ICD-10-CM | POA: Diagnosis not present

## 2016-09-12 DIAGNOSIS — Z87440 Personal history of urinary (tract) infections: Secondary | ICD-10-CM | POA: Diagnosis not present

## 2016-09-13 ENCOUNTER — Ambulatory Visit: Payer: Self-pay | Admitting: Family

## 2016-09-13 DIAGNOSIS — I5031 Acute diastolic (congestive) heart failure: Secondary | ICD-10-CM | POA: Diagnosis not present

## 2016-09-13 DIAGNOSIS — Z87440 Personal history of urinary (tract) infections: Secondary | ICD-10-CM | POA: Diagnosis not present

## 2016-09-13 DIAGNOSIS — I110 Hypertensive heart disease with heart failure: Secondary | ICD-10-CM | POA: Diagnosis not present

## 2016-09-13 DIAGNOSIS — J441 Chronic obstructive pulmonary disease with (acute) exacerbation: Secondary | ICD-10-CM | POA: Diagnosis not present

## 2016-09-13 DIAGNOSIS — Z8701 Personal history of pneumonia (recurrent): Secondary | ICD-10-CM | POA: Diagnosis not present

## 2016-09-13 DIAGNOSIS — I2510 Atherosclerotic heart disease of native coronary artery without angina pectoris: Secondary | ICD-10-CM | POA: Diagnosis not present

## 2016-09-15 DIAGNOSIS — J449 Chronic obstructive pulmonary disease, unspecified: Secondary | ICD-10-CM | POA: Diagnosis not present

## 2016-09-19 DIAGNOSIS — J9620 Acute and chronic respiratory failure, unspecified whether with hypoxia or hypercapnia: Secondary | ICD-10-CM | POA: Diagnosis not present

## 2016-09-19 DIAGNOSIS — I5032 Chronic diastolic (congestive) heart failure: Secondary | ICD-10-CM | POA: Diagnosis not present

## 2016-09-19 DIAGNOSIS — J9601 Acute respiratory failure with hypoxia: Secondary | ICD-10-CM | POA: Diagnosis not present

## 2016-09-19 DIAGNOSIS — J441 Chronic obstructive pulmonary disease with (acute) exacerbation: Secondary | ICD-10-CM | POA: Diagnosis not present

## 2016-09-19 DIAGNOSIS — J449 Chronic obstructive pulmonary disease, unspecified: Secondary | ICD-10-CM | POA: Diagnosis not present

## 2016-09-20 ENCOUNTER — Telehealth: Payer: Self-pay | Admitting: Pulmonary Disease

## 2016-09-20 NOTE — Telephone Encounter (Signed)
Pt called asking if we can call her back  She has question on her Bpap machine  Please call

## 2016-09-20 NOTE — Telephone Encounter (Signed)
Pt called stating that they have had a death in the family and need to go out of town and pt was asking if she could do without her BiPAP for 1 night. I informed pt I didn't think this was a good idea since she has had admissions and complications over the last couple of months. Called [employer] and spoke with leslie whom states she will call the pt and discuss about getting an O2 concentrator delivered to where pt is staying out of town. Pt informed she would be calling. Pt verbalized understanding. Nothing further needed.

## 2016-09-21 DIAGNOSIS — J441 Chronic obstructive pulmonary disease with (acute) exacerbation: Secondary | ICD-10-CM | POA: Diagnosis not present

## 2016-09-21 DIAGNOSIS — I110 Hypertensive heart disease with heart failure: Secondary | ICD-10-CM | POA: Diagnosis not present

## 2016-09-21 DIAGNOSIS — I5031 Acute diastolic (congestive) heart failure: Secondary | ICD-10-CM | POA: Diagnosis not present

## 2016-09-21 DIAGNOSIS — Z8701 Personal history of pneumonia (recurrent): Secondary | ICD-10-CM | POA: Diagnosis not present

## 2016-09-21 DIAGNOSIS — Z87440 Personal history of urinary (tract) infections: Secondary | ICD-10-CM | POA: Diagnosis not present

## 2016-09-21 DIAGNOSIS — I2510 Atherosclerotic heart disease of native coronary artery without angina pectoris: Secondary | ICD-10-CM | POA: Diagnosis not present

## 2016-09-24 ENCOUNTER — Ambulatory Visit
Admission: RE | Admit: 2016-09-24 | Discharge: 2016-09-24 | Disposition: A | Discharge status: Home / Self Care | Payer: Medicare HMO | Source: Ambulatory Visit | Attending: Pulmonary Disease | Admitting: Pulmonary Disease

## 2016-09-24 ENCOUNTER — Encounter: Payer: Self-pay | Admitting: Pulmonary Disease

## 2016-09-24 ENCOUNTER — Ambulatory Visit (INDEPENDENT_AMBULATORY_CARE_PROVIDER_SITE_OTHER): Payer: Medicare HMO | Admitting: Pulmonary Disease

## 2016-09-24 ENCOUNTER — Ambulatory Visit
Admission: RE | Admit: 2016-09-24 | Discharge: 2016-09-24 | Disposition: A | Discharge status: Home / Self Care | Payer: Medicare HMO | Source: Ambulatory Visit | Attending: Internal Medicine | Admitting: Internal Medicine

## 2016-09-24 VITALS — BP 98/62 | HR 80 | Resp 16 | Ht 65.0 in | Wt 230.0 lb

## 2016-09-24 DIAGNOSIS — R0989 Other specified symptoms and signs involving the circulatory and respiratory systems: Secondary | ICD-10-CM | POA: Diagnosis not present

## 2016-09-24 DIAGNOSIS — J449 Chronic obstructive pulmonary disease, unspecified: Secondary | ICD-10-CM

## 2016-09-24 DIAGNOSIS — Z8701 Personal history of pneumonia (recurrent): Secondary | ICD-10-CM | POA: Diagnosis not present

## 2016-09-24 DIAGNOSIS — J9612 Chronic respiratory failure with hypercapnia: Secondary | ICD-10-CM | POA: Diagnosis not present

## 2016-09-24 DIAGNOSIS — R600 Localized edema: Secondary | ICD-10-CM | POA: Insufficient documentation

## 2016-09-24 DIAGNOSIS — J9611 Chronic respiratory failure with hypoxia: Secondary | ICD-10-CM

## 2016-09-24 NOTE — Patient Instructions (Addendum)
Continue Breo inhaler Continue oxygen therapy at 2 LPM as close to 24 hours per day as possible Continue nocturnal BiPAP Chest x-ray today Follow-up in 3-4 months

## 2016-09-25 DIAGNOSIS — Z87440 Personal history of urinary (tract) infections: Secondary | ICD-10-CM | POA: Diagnosis not present

## 2016-09-25 DIAGNOSIS — Z8701 Personal history of pneumonia (recurrent): Secondary | ICD-10-CM | POA: Diagnosis not present

## 2016-09-25 DIAGNOSIS — I2510 Atherosclerotic heart disease of native coronary artery without angina pectoris: Secondary | ICD-10-CM | POA: Diagnosis not present

## 2016-09-25 DIAGNOSIS — I110 Hypertensive heart disease with heart failure: Secondary | ICD-10-CM | POA: Diagnosis not present

## 2016-09-25 DIAGNOSIS — I5031 Acute diastolic (congestive) heart failure: Secondary | ICD-10-CM | POA: Diagnosis not present

## 2016-09-25 DIAGNOSIS — J441 Chronic obstructive pulmonary disease with (acute) exacerbation: Secondary | ICD-10-CM | POA: Diagnosis not present

## 2016-09-27 NOTE — Progress Notes (Signed)
PULMONARY OFFICE FOLLOW UP NOTE  PROBLEMS:  Chronic hypoxemic/hypercarbic respiratory failure  Chronic BiPAP Moderate COPD Obesity  Severe pneumonia requiring intubation and prolonged hospitalization 04/16-05/08/18  DATA: 08/05/15 ONO: prolonged desaturation with nadir of 79% 09/13/15 PFTs: FVC: 1.77 L (56 %pred), FEV1: 1.27 L (52 %pred), FEV1/FVC: 72, TLC: 5.20 L (98 %pred), DLCO 63 %pred 06/07-07/06/18 compliance report: usage 30/30 nights, > 4 hrs 29/30 nights 09/24/16 LE venous US: no DVT   INTERVAL HISTORY: Readmitted 05/16-05/23 with acute on chronic hypercarbic respiratory failure  SUBJ: Routine re-eval. She has nearly fully recovered back to her previous baseline. She is wearing O2 > 20 hrs/day. She is using BiPAP with sleep and feels that this has been very beneficial to her. Maintained on Breo inhaler, daily azithromycin. Uses Duoneb infrequently. Reports increased LE edema. Metolazone initiated recently (5 mg QOD). Denies CP, fever, purulent sputum, hemoptysis and calf tenderness.  OBJ: Vitals:   09/24/16 1143 09/24/16 1144  BP:  98/62  Pulse:  80  Resp: 16   SpO2:  97%  Weight: 104.3 kg (230 lb)   Height: 5\' 5"  (1.651 m)   2 LPM [place]  Gen: Obese, NAD HEENT: NCAT, sclerae white, oropharynx normal Neck: NO LAN, no JVD noted Lungs: decreased BS, no adventitious sounds Cardiovascular: Reg, no M noted Abdomen: Obese, soft, NT, +BS Ext: 3+ symmetric LE edema Neuro: PERRL, EOMI, motor/sensory grossly intact   DATA: CXR 07/09: R sided infiltrate resolved. Chronic changes and mild vascular congestion   IMPRESSION: Chronic respiratory failure with hypoxia and hypercapnia (HCC) - Plan: DG Chest 2 View  COPD, moderate (HCC) - Plan: DG Chest 2 View  Recent pneumococcal pneumonia   Mod-severe obesity contributing to DOE and hypercarbia  PLAN: Continue Breo inhaler Continue oxygen therapy at 2 LPM as close to 24 hours per day as possible Continue nocturnal  BiPAP Chest x-ray today - reviewed above Follow-up in 3-4 months with Dr Clydell Hakim, MD PCCM service Mobile [phone] Pager [phone] 09/27/2016  3:10 AM

## 2016-09-28 ENCOUNTER — Telehealth: Payer: Self-pay | Admitting: Pulmonary Disease

## 2016-09-28 DIAGNOSIS — I5031 Acute diastolic (congestive) heart failure: Secondary | ICD-10-CM | POA: Diagnosis not present

## 2016-09-28 DIAGNOSIS — Z87440 Personal history of urinary (tract) infections: Secondary | ICD-10-CM | POA: Diagnosis not present

## 2016-09-28 DIAGNOSIS — Z8701 Personal history of pneumonia (recurrent): Secondary | ICD-10-CM | POA: Diagnosis not present

## 2016-09-28 DIAGNOSIS — J441 Chronic obstructive pulmonary disease with (acute) exacerbation: Secondary | ICD-10-CM | POA: Diagnosis not present

## 2016-09-28 DIAGNOSIS — I110 Hypertensive heart disease with heart failure: Secondary | ICD-10-CM | POA: Diagnosis not present

## 2016-09-28 DIAGNOSIS — I2510 Atherosclerotic heart disease of native coronary artery without angina pectoris: Secondary | ICD-10-CM | POA: Diagnosis not present

## 2016-09-28 NOTE — Telephone Encounter (Signed)
Patient wants xray results

## 2016-09-29 DIAGNOSIS — I110 Hypertensive heart disease with heart failure: Secondary | ICD-10-CM | POA: Diagnosis not present

## 2016-09-29 DIAGNOSIS — I5031 Acute diastolic (congestive) heart failure: Secondary | ICD-10-CM | POA: Diagnosis not present

## 2016-09-29 DIAGNOSIS — Z87440 Personal history of urinary (tract) infections: Secondary | ICD-10-CM | POA: Diagnosis not present

## 2016-09-29 DIAGNOSIS — J441 Chronic obstructive pulmonary disease with (acute) exacerbation: Secondary | ICD-10-CM | POA: Diagnosis not present

## 2016-09-29 DIAGNOSIS — Z8701 Personal history of pneumonia (recurrent): Secondary | ICD-10-CM | POA: Diagnosis not present

## 2016-09-29 DIAGNOSIS — I2510 Atherosclerotic heart disease of native coronary artery without angina pectoris: Secondary | ICD-10-CM | POA: Diagnosis not present

## 2016-09-29 NOTE — Telephone Encounter (Signed)
CXR looks good. The pneumonia changes have completely resolved  Merton Border, MD PCCM service Mobile [phone] Pager [phone] 09/29/2016 10:57 AM

## 2016-10-01 ENCOUNTER — Ambulatory Visit: Payer: Medicare HMO | Attending: Family | Admitting: Family

## 2016-10-01 ENCOUNTER — Encounter: Payer: Self-pay | Admitting: Family

## 2016-10-01 VITALS — BP 110/50 | HR 80 | Resp 18 | Ht 66.0 in | Wt 230.1 lb

## 2016-10-01 DIAGNOSIS — G2581 Restless legs syndrome: Secondary | ICD-10-CM | POA: Diagnosis not present

## 2016-10-01 DIAGNOSIS — E039 Hypothyroidism, unspecified: Secondary | ICD-10-CM | POA: Insufficient documentation

## 2016-10-01 DIAGNOSIS — Z87891 Personal history of nicotine dependence: Secondary | ICD-10-CM | POA: Insufficient documentation

## 2016-10-01 DIAGNOSIS — J449 Chronic obstructive pulmonary disease, unspecified: Secondary | ICD-10-CM | POA: Diagnosis not present

## 2016-10-01 DIAGNOSIS — I10 Essential (primary) hypertension: Secondary | ICD-10-CM

## 2016-10-01 DIAGNOSIS — J410 Simple chronic bronchitis: Secondary | ICD-10-CM

## 2016-10-01 DIAGNOSIS — Z7982 Long term (current) use of aspirin: Secondary | ICD-10-CM | POA: Insufficient documentation

## 2016-10-01 DIAGNOSIS — G4733 Obstructive sleep apnea (adult) (pediatric): Secondary | ICD-10-CM | POA: Insufficient documentation

## 2016-10-01 DIAGNOSIS — I110 Hypertensive heart disease with heart failure: Secondary | ICD-10-CM | POA: Insufficient documentation

## 2016-10-01 DIAGNOSIS — I5032 Chronic diastolic (congestive) heart failure: Secondary | ICD-10-CM | POA: Insufficient documentation

## 2016-10-01 DIAGNOSIS — Z87440 Personal history of urinary (tract) infections: Secondary | ICD-10-CM | POA: Diagnosis not present

## 2016-10-01 DIAGNOSIS — M1990 Unspecified osteoarthritis, unspecified site: Secondary | ICD-10-CM | POA: Diagnosis not present

## 2016-10-01 DIAGNOSIS — F419 Anxiety disorder, unspecified: Secondary | ICD-10-CM | POA: Insufficient documentation

## 2016-10-01 NOTE — Progress Notes (Signed)
Patient ID: Hannah Powell, female    DOB: 1945/09/07, 71 y.o.   MRN: [id number]  HPI  Hannah Powell is a 71 y/o female with a history of anxiety, arthritis, COPD, HTN, hypothyroidism, obstructive sleep apnea, remote tobacco use and chronic heart failure.   Reviewed last echo done on 07/14/16 which showed an EF of 55-65% along with mild TR.   Admitted 08/01/16 with acute COPD/HF. Initially needed bipap and transitioned to nasal cannula at 3L. Chest CT obtained without pulmonary emboli. Given IV diuretics and then transitioned to oral diuretics. Cardiology consult obtained. Discharged with prednisone and antibiotics after 5 days.  Admitted 07/02/16 with acute on chronic hypercarbic respiratory failure along with severe sepsis. Had to be intubated and have a thoracentesis done. Was given bipap as well. IV antibiotics were used. Patient was discharged to SNF after 22 days. Was in the ED 06/30/16 due to a UTI. Treated and released.   She presents today for her follow-up visit with a chief complaint of mild fatigue with moderate exertion. She says that this has been present for years with varying levels of severity but has recently been improving. She has associated pedal edema along with this. Denies any shortness of breath, chest pain, palpitations or weight gain.   Past Medical History:  Diagnosis Date  . Anxiety   . Arthritis   . CHF (congestive heart failure) ([place])   . COPD (chronic obstructive pulmonary disease) ([place])   . Hypertension    Hannah Powell     . Hypothyroidism   . RLS (restless legs syndrome)   . Shortness of breath   . Sleep apnea    cpap      >5 yrs   Past Surgical History:  Procedure Laterality Date  . BACK SURGERY     cervical  . BREAST BIOPSY Left    needle bx-neg  . FOOT ARTHROPLASTY    . JOINT REPLACEMENT     x2 tkr  . TOTAL SHOULDER ARTHROPLASTY Left 07/02/2013   Procedure: LEFT TOTAL SHOULDER ARTHROPLASTY;  Surgeon: Marin Shutter, MD;  Location: [place];  Service:  Orthopedics;  Laterality: Left;   Family History  Problem Relation Age of Onset  . Hypertension Mother   . Diabetes Mother   . Heart failure Father   . Breast cancer Neg Hx    Social History  Substance Use Topics  . Smoking status: Former Smoker    Packs/day: 1.00    Years: 10.00    Quit date: 03/20/1975  . Smokeless tobacco: Never Used  . Alcohol use Yes     Comment: occ   Allergies  Allergen Reactions  . Lyrica [Pregabalin] Other (See Comments)    Reaction: made Restless Leg Syndrome worse    Prior to Admission medications   Medication Sig Start Date End Date Taking? Authorizing Provider  ALPRAZolam (XANAX) 0.25 MG tablet Take 1 tablet (0.25 mg total) by mouth 3 (three) times daily as needed for anxiety. 08/03/16  Yes Bettey Costa, MD  aspirin EC 81 MG tablet Take 81 mg by mouth daily.   Yes [provider]  B Complex-C (B-COMPLEX WITH VITAMIN C) tablet Take 1 tablet by mouth daily.   Yes [provider]  Calcium Carbonate-Vitamin D (CALCIUM-VITAMIN D) 500-200 MG-UNIT per tablet Take 1 tablet by mouth daily.   Yes [provider]  cyclobenzaprine (FLEXERIL) 10 MG tablet Take 0.5-1 mg by mouth 3 (three) times daily as needed for muscle spasms.   Yes [provider]  ferrous sulfate 325 (65 FE) MG tablet Take 325 mg by mouth daily with breakfast.   Yes [provider]  fluticasone furoate-vilanterol (BREO ELLIPTA) 200-25 MCG/INH AEPB Inhale 1 puff into the lungs daily.   Yes [provider]  furosemide (LASIX) 20 MG tablet Take 2 tablets (40 mg total) by mouth 2 (two) times daily. Patient taking differently: Take 80 mg by mouth daily.  08/03/16  Yes Mody, Ulice Bold, MD  gabapentin (NEURONTIN) 300 MG capsule Take 300 mg by mouth 2 (two) times daily.   Yes [provider]  guaiFENesin (ROBITUSSIN) 100 MG/5ML SOLN Take 5 mLs (100 mg total) by mouth every 4 (four) hours as needed for cough or to loosen phlegm. 08/06/16  Yes Mody,  Sital, MD  ipratropium-albuterol (DUONEB) 0.5-2.5 (3) MG/3ML SOLN Take 3 mLs by nebulization every 4 (four) hours as needed. 08/03/16  Yes Mody, Ulice Bold, MD  levothyroxine (SYNTHROID, LEVOTHROID) 200 MCG tablet Take 200 mcg by mouth daily before breakfast.   Yes [provider]  losartan (COZAAR) 25 MG tablet Take 25 mg by mouth daily.   Yes [provider]  nystatin (NYSTATIN) powder Apply 1 g topically 2 (two) times daily.   Yes [provider]  Omega-3 Fatty Acids (OMEGA 3 PO) Take 1 capsule by mouth daily.   Yes [provider]  oxybutynin (DITROPAN) 5 MG tablet Take 5 mg by mouth 3 (three) times daily.   Yes [provider]  potassium chloride SA (K-DUR,KLOR-CON) 20 MEQ tablet Take 20 mEq by mouth daily.    Yes [provider]  pramipexole (MIRAPEX) 1 MG tablet Take 4 mg by mouth at bedtime.    Yes [provider]  roflumilast (DALIRESP) 500 MCG TABS tablet Take 1 tablet (500 mcg total) by mouth daily. 02/16/16  Yes Flora Lipps, MD  traMADol (ULTRAM) 50 MG tablet Take 1 tablet (50 mg total) by mouth every 8 (eight) hours as needed. 08/03/16  Yes Mody, Ulice Bold, MD  zolpidem (AMBIEN) 10 MG tablet Take 5 mg by mouth at bedtime as needed for sleep.   Yes [provider]   Review of Systems  Constitutional: Positive for fatigue (improving). Negative for appetite change.  HENT: Negative for congestion, postnasal drip and sore throat.   Eyes: Negative.   Respiratory: Negative for cough, chest tightness, shortness of breath and wheezing.   Cardiovascular: Positive for leg swelling. Negative for chest pain and palpitations.  Gastrointestinal: Negative for abdominal distention and abdominal pain.  Endocrine: Negative.   Genitourinary: Negative.   Musculoskeletal: Negative for back pain and neck pain.  Skin: Negative.   Allergic/Immunologic: Negative.   Neurological: Negative for dizziness and light-headedness.  Hematological:  Negative for adenopathy. Bruises/bleeds easily.  Psychiatric/Behavioral: Negative for dysphoric mood and sleep disturbance (wearing bipap and oxygen @3L  at night). The patient is not nervous/anxious.    Vitals:   10/01/16 1029  BP: (!) 110/50  Pulse: 80  Resp: 18  SpO2: 100%  Weight: 230 lb 2 oz (104.4 kg)  Height: 5\' 6"  (1.676 m)   Wt Readings from Last 3 Encounters:  10/01/16 230 lb 2 oz (104.4 kg)  09/24/16 230 lb (104.3 kg)  08/21/16 239 lb (108.4 kg)   Lab Results  Component Value Date   CREATININE 0.51 08/06/2016   CREATININE 0.68 08/05/2016   CREATININE 0.61 08/04/2016   Physical Exam  Constitutional: She is oriented to person, place, and time. She appears well-developed and well-nourished.  HENT:  Head:  Normocephalic and atraumatic.  Neck: Normal range of motion. Neck supple. No JVD present.  Cardiovascular: Normal rate and regular rhythm.   Pulmonary/Chest: Effort normal. She has no wheezes. She has no rales.  Abdominal: Soft. She exhibits no distension. There is no tenderness.  Musculoskeletal: She exhibits edema (2+ pitting edema in bilateral lower legs with L>R). She exhibits no tenderness.  Neurological: She is alert and oriented to person, place, and time.  Skin: Skin is warm and dry.  Psychiatric: She has a normal mood and affect. Her behavior is normal.  Nursing note and vitals reviewed.   Assessment & Plan:  1: Chronic heart failure with preserved ejection fraction- - NYHA class II - chronic mild fluid overload today with lower extremity edema - weighing daily at home and says that she's had a gradual weight loss as she's trying to decrease portion sizes. Reminded to call for an overnight weight gain of >2 pounds or a weekly weight gain of >5 pounds - does use "lite salt" and she was encouraged to not add any salt to her food. Reminded to closely follow a 2000mg  sodium diet - doing PT twice weekly - wearing compression socks on a daily basis with removal  of them at bedtime  2: HTN- - BP looks good today - Reviewed BMP drawn on 08/06/16 shows sodium 140, potassium 3.7 and GFR >60 - sees PCP Hannah Powell) 10/09/16  3: COPD- - wearing oxygen at 2L around the clock - wearing bipap at bedtime - saw pulmonologist Hannah Powell) 09/24/16  Patient did not bring her medications nor a list. Each medication was verbally reviewed with the patient and she was encouraged to bring the bottles to every visit to confirm accuracy of list.  Patient opts to not make a follow-up appointment at this time. Advised her that she could call back at any point to schedule another appointment.

## 2016-10-01 NOTE — Patient Instructions (Signed)
Continue weighing daily and call for an overnight weight gain of > 2 pounds or a weekly weight gain of >5 pounds. 

## 2016-10-01 NOTE — Telephone Encounter (Signed)
Patient notified

## 2016-10-02 DIAGNOSIS — J441 Chronic obstructive pulmonary disease with (acute) exacerbation: Secondary | ICD-10-CM | POA: Diagnosis not present

## 2016-10-02 DIAGNOSIS — Z8701 Personal history of pneumonia (recurrent): Secondary | ICD-10-CM | POA: Diagnosis not present

## 2016-10-02 DIAGNOSIS — R0902 Hypoxemia: Secondary | ICD-10-CM | POA: Diagnosis not present

## 2016-10-02 DIAGNOSIS — I110 Hypertensive heart disease with heart failure: Secondary | ICD-10-CM | POA: Diagnosis not present

## 2016-10-02 DIAGNOSIS — I5031 Acute diastolic (congestive) heart failure: Secondary | ICD-10-CM | POA: Diagnosis not present

## 2016-10-02 DIAGNOSIS — I509 Heart failure, unspecified: Secondary | ICD-10-CM | POA: Diagnosis not present

## 2016-10-02 DIAGNOSIS — Z87440 Personal history of urinary (tract) infections: Secondary | ICD-10-CM | POA: Diagnosis not present

## 2016-10-02 DIAGNOSIS — R0689 Other abnormalities of breathing: Secondary | ICD-10-CM | POA: Diagnosis not present

## 2016-10-02 DIAGNOSIS — J449 Chronic obstructive pulmonary disease, unspecified: Secondary | ICD-10-CM | POA: Diagnosis not present

## 2016-10-02 DIAGNOSIS — I2510 Atherosclerotic heart disease of native coronary artery without angina pectoris: Secondary | ICD-10-CM | POA: Diagnosis not present

## 2016-10-03 DIAGNOSIS — I110 Hypertensive heart disease with heart failure: Secondary | ICD-10-CM | POA: Diagnosis not present

## 2016-10-03 DIAGNOSIS — Z8701 Personal history of pneumonia (recurrent): Secondary | ICD-10-CM | POA: Diagnosis not present

## 2016-10-03 DIAGNOSIS — I5031 Acute diastolic (congestive) heart failure: Secondary | ICD-10-CM | POA: Diagnosis not present

## 2016-10-03 DIAGNOSIS — Z87440 Personal history of urinary (tract) infections: Secondary | ICD-10-CM | POA: Diagnosis not present

## 2016-10-03 DIAGNOSIS — J441 Chronic obstructive pulmonary disease with (acute) exacerbation: Secondary | ICD-10-CM | POA: Diagnosis not present

## 2016-10-03 DIAGNOSIS — I2510 Atherosclerotic heart disease of native coronary artery without angina pectoris: Secondary | ICD-10-CM | POA: Diagnosis not present

## 2016-10-04 DIAGNOSIS — J441 Chronic obstructive pulmonary disease with (acute) exacerbation: Secondary | ICD-10-CM | POA: Diagnosis not present

## 2016-10-04 DIAGNOSIS — I110 Hypertensive heart disease with heart failure: Secondary | ICD-10-CM | POA: Diagnosis not present

## 2016-10-04 DIAGNOSIS — Z87440 Personal history of urinary (tract) infections: Secondary | ICD-10-CM | POA: Diagnosis not present

## 2016-10-04 DIAGNOSIS — I2510 Atherosclerotic heart disease of native coronary artery without angina pectoris: Secondary | ICD-10-CM | POA: Diagnosis not present

## 2016-10-04 DIAGNOSIS — Z8701 Personal history of pneumonia (recurrent): Secondary | ICD-10-CM | POA: Diagnosis not present

## 2016-10-04 DIAGNOSIS — I5031 Acute diastolic (congestive) heart failure: Secondary | ICD-10-CM | POA: Diagnosis not present

## 2016-10-08 ENCOUNTER — Telehealth: Payer: Self-pay | Admitting: Internal Medicine

## 2016-10-08 DIAGNOSIS — I2510 Atherosclerotic heart disease of native coronary artery without angina pectoris: Secondary | ICD-10-CM | POA: Diagnosis not present

## 2016-10-08 DIAGNOSIS — Z87440 Personal history of urinary (tract) infections: Secondary | ICD-10-CM | POA: Diagnosis not present

## 2016-10-08 DIAGNOSIS — I5031 Acute diastolic (congestive) heart failure: Secondary | ICD-10-CM | POA: Diagnosis not present

## 2016-10-08 DIAGNOSIS — Z8701 Personal history of pneumonia (recurrent): Secondary | ICD-10-CM | POA: Diagnosis not present

## 2016-10-08 DIAGNOSIS — I110 Hypertensive heart disease with heart failure: Secondary | ICD-10-CM | POA: Diagnosis not present

## 2016-10-08 DIAGNOSIS — J441 Chronic obstructive pulmonary disease with (acute) exacerbation: Secondary | ICD-10-CM | POA: Diagnosis not present

## 2016-10-08 NOTE — Telephone Encounter (Signed)
Pt is calling regarding her oxygen levels. Pt would not give me any other information

## 2016-10-08 NOTE — Telephone Encounter (Signed)
Pt states that physical therapy has been coming out to her home and while there checking her O2 levels. Pt states that they walked out her back door and down the ramp and levels were 98% RA. Came back into the house to the couch and levels were 94% on RA. States they walked a day around her living room for 5 mins and RA levels were 98% and at 7 mins 96%. States physical therapy came in today and she walked for 5 mins with levels at 98% on RA and 9 mins with levels 97% RA. Please advise if pt should still wear her O2.

## 2016-10-09 DIAGNOSIS — G63 Polyneuropathy in diseases classified elsewhere: Secondary | ICD-10-CM | POA: Diagnosis not present

## 2016-10-09 DIAGNOSIS — E034 Atrophy of thyroid (acquired): Secondary | ICD-10-CM | POA: Diagnosis not present

## 2016-10-09 DIAGNOSIS — I5032 Chronic diastolic (congestive) heart failure: Secondary | ICD-10-CM | POA: Diagnosis not present

## 2016-10-09 DIAGNOSIS — G4733 Obstructive sleep apnea (adult) (pediatric): Secondary | ICD-10-CM | POA: Diagnosis not present

## 2016-10-09 DIAGNOSIS — J9611 Chronic respiratory failure with hypoxia: Secondary | ICD-10-CM | POA: Diagnosis not present

## 2016-10-09 DIAGNOSIS — M4696 Unspecified inflammatory spondylopathy, lumbar region: Secondary | ICD-10-CM | POA: Diagnosis not present

## 2016-10-09 DIAGNOSIS — I10 Essential (primary) hypertension: Secondary | ICD-10-CM | POA: Diagnosis not present

## 2016-10-09 DIAGNOSIS — M159 Polyosteoarthritis, unspecified: Secondary | ICD-10-CM | POA: Diagnosis not present

## 2016-10-09 DIAGNOSIS — J438 Other emphysema: Secondary | ICD-10-CM | POA: Diagnosis not present

## 2016-10-09 NOTE — Telephone Encounter (Signed)
LMOM for pt to return call. 

## 2016-10-09 NOTE — Telephone Encounter (Signed)
Pt informed of response and nothing further needed.

## 2016-10-09 NOTE — Telephone Encounter (Signed)
She may discontinue O2 during the day. Continue to wear it with sleep as previously presctibed  Merton Border, MD PCCM service Mobile [phone] Pager [phone] 10/09/2016 1:39 PM

## 2016-10-10 ENCOUNTER — Telehealth: Payer: Self-pay | Admitting: Internal Medicine

## 2016-10-10 DIAGNOSIS — Z8701 Personal history of pneumonia (recurrent): Secondary | ICD-10-CM | POA: Diagnosis not present

## 2016-10-10 DIAGNOSIS — I110 Hypertensive heart disease with heart failure: Secondary | ICD-10-CM | POA: Diagnosis not present

## 2016-10-10 DIAGNOSIS — I5031 Acute diastolic (congestive) heart failure: Secondary | ICD-10-CM | POA: Diagnosis not present

## 2016-10-10 DIAGNOSIS — Z87440 Personal history of urinary (tract) infections: Secondary | ICD-10-CM | POA: Diagnosis not present

## 2016-10-10 DIAGNOSIS — J441 Chronic obstructive pulmonary disease with (acute) exacerbation: Secondary | ICD-10-CM | POA: Diagnosis not present

## 2016-10-10 DIAGNOSIS — I2510 Atherosclerotic heart disease of native coronary artery without angina pectoris: Secondary | ICD-10-CM | POA: Diagnosis not present

## 2016-10-10 NOTE — Telephone Encounter (Signed)
Returned call to patient. She is trying to not wear daytime 02. She admits to wearing if she is getting dressed. Patient encouraged to continue to without 02 but keep checking levels.

## 2016-10-10 NOTE — Telephone Encounter (Signed)
Pt needs to speak with someone regarding her oxygen levels.

## 2016-10-16 DIAGNOSIS — J449 Chronic obstructive pulmonary disease, unspecified: Secondary | ICD-10-CM | POA: Diagnosis not present

## 2016-10-17 DIAGNOSIS — E876 Hypokalemia: Secondary | ICD-10-CM | POA: Diagnosis not present

## 2016-10-19 DIAGNOSIS — Z87440 Personal history of urinary (tract) infections: Secondary | ICD-10-CM | POA: Diagnosis not present

## 2016-10-19 DIAGNOSIS — Z8701 Personal history of pneumonia (recurrent): Secondary | ICD-10-CM | POA: Diagnosis not present

## 2016-10-19 DIAGNOSIS — I110 Hypertensive heart disease with heart failure: Secondary | ICD-10-CM | POA: Diagnosis not present

## 2016-10-19 DIAGNOSIS — I2510 Atherosclerotic heart disease of native coronary artery without angina pectoris: Secondary | ICD-10-CM | POA: Diagnosis not present

## 2016-10-19 DIAGNOSIS — J441 Chronic obstructive pulmonary disease with (acute) exacerbation: Secondary | ICD-10-CM | POA: Diagnosis not present

## 2016-10-19 DIAGNOSIS — I5031 Acute diastolic (congestive) heart failure: Secondary | ICD-10-CM | POA: Diagnosis not present

## 2016-10-20 DIAGNOSIS — J441 Chronic obstructive pulmonary disease with (acute) exacerbation: Secondary | ICD-10-CM | POA: Diagnosis not present

## 2016-10-20 DIAGNOSIS — J449 Chronic obstructive pulmonary disease, unspecified: Secondary | ICD-10-CM | POA: Diagnosis not present

## 2016-10-20 DIAGNOSIS — J9620 Acute and chronic respiratory failure, unspecified whether with hypoxia or hypercapnia: Secondary | ICD-10-CM | POA: Diagnosis not present

## 2016-10-20 DIAGNOSIS — J9601 Acute respiratory failure with hypoxia: Secondary | ICD-10-CM | POA: Diagnosis not present

## 2016-10-20 DIAGNOSIS — I5032 Chronic diastolic (congestive) heart failure: Secondary | ICD-10-CM | POA: Diagnosis not present

## 2016-10-24 DIAGNOSIS — E876 Hypokalemia: Secondary | ICD-10-CM | POA: Diagnosis not present

## 2016-11-02 DIAGNOSIS — R0689 Other abnormalities of breathing: Secondary | ICD-10-CM | POA: Diagnosis not present

## 2016-11-02 DIAGNOSIS — I509 Heart failure, unspecified: Secondary | ICD-10-CM | POA: Diagnosis not present

## 2016-11-02 DIAGNOSIS — J449 Chronic obstructive pulmonary disease, unspecified: Secondary | ICD-10-CM | POA: Diagnosis not present

## 2016-11-02 DIAGNOSIS — R0902 Hypoxemia: Secondary | ICD-10-CM | POA: Diagnosis not present

## 2016-11-07 DIAGNOSIS — I5032 Chronic diastolic (congestive) heart failure: Secondary | ICD-10-CM | POA: Diagnosis not present

## 2016-11-07 DIAGNOSIS — J438 Other emphysema: Secondary | ICD-10-CM | POA: Diagnosis not present

## 2016-11-07 DIAGNOSIS — J9611 Chronic respiratory failure with hypoxia: Secondary | ICD-10-CM | POA: Diagnosis not present

## 2016-11-07 DIAGNOSIS — I10 Essential (primary) hypertension: Secondary | ICD-10-CM | POA: Diagnosis not present

## 2016-11-08 DIAGNOSIS — E876 Hypokalemia: Secondary | ICD-10-CM | POA: Diagnosis not present

## 2016-11-16 DIAGNOSIS — J449 Chronic obstructive pulmonary disease, unspecified: Secondary | ICD-10-CM | POA: Diagnosis not present

## 2016-11-20 DIAGNOSIS — I5032 Chronic diastolic (congestive) heart failure: Secondary | ICD-10-CM | POA: Diagnosis not present

## 2016-11-20 DIAGNOSIS — J449 Chronic obstructive pulmonary disease, unspecified: Secondary | ICD-10-CM | POA: Diagnosis not present

## 2016-11-20 DIAGNOSIS — J9620 Acute and chronic respiratory failure, unspecified whether with hypoxia or hypercapnia: Secondary | ICD-10-CM | POA: Diagnosis not present

## 2016-11-20 DIAGNOSIS — J441 Chronic obstructive pulmonary disease with (acute) exacerbation: Secondary | ICD-10-CM | POA: Diagnosis not present

## 2016-11-20 DIAGNOSIS — J9601 Acute respiratory failure with hypoxia: Secondary | ICD-10-CM | POA: Diagnosis not present

## 2016-11-21 DIAGNOSIS — R29898 Other symptoms and signs involving the musculoskeletal system: Secondary | ICD-10-CM | POA: Diagnosis not present

## 2016-11-22 DIAGNOSIS — I5032 Chronic diastolic (congestive) heart failure: Secondary | ICD-10-CM | POA: Diagnosis not present

## 2016-11-22 DIAGNOSIS — J441 Chronic obstructive pulmonary disease with (acute) exacerbation: Secondary | ICD-10-CM | POA: Diagnosis not present

## 2016-11-22 DIAGNOSIS — J9601 Acute respiratory failure with hypoxia: Secondary | ICD-10-CM | POA: Diagnosis not present

## 2016-11-22 DIAGNOSIS — J449 Chronic obstructive pulmonary disease, unspecified: Secondary | ICD-10-CM | POA: Diagnosis not present

## 2016-11-22 DIAGNOSIS — J9620 Acute and chronic respiratory failure, unspecified whether with hypoxia or hypercapnia: Secondary | ICD-10-CM | POA: Diagnosis not present

## 2016-11-23 DIAGNOSIS — E876 Hypokalemia: Secondary | ICD-10-CM | POA: Diagnosis not present

## 2016-11-28 DIAGNOSIS — E876 Hypokalemia: Secondary | ICD-10-CM | POA: Diagnosis not present

## 2016-11-29 DIAGNOSIS — I5032 Chronic diastolic (congestive) heart failure: Secondary | ICD-10-CM | POA: Diagnosis not present

## 2016-11-29 DIAGNOSIS — J9601 Acute respiratory failure with hypoxia: Secondary | ICD-10-CM | POA: Diagnosis not present

## 2016-11-29 DIAGNOSIS — J449 Chronic obstructive pulmonary disease, unspecified: Secondary | ICD-10-CM | POA: Diagnosis not present

## 2016-11-29 DIAGNOSIS — J441 Chronic obstructive pulmonary disease with (acute) exacerbation: Secondary | ICD-10-CM | POA: Diagnosis not present

## 2016-11-29 DIAGNOSIS — J9620 Acute and chronic respiratory failure, unspecified whether with hypoxia or hypercapnia: Secondary | ICD-10-CM | POA: Diagnosis not present

## 2016-12-03 DIAGNOSIS — I509 Heart failure, unspecified: Secondary | ICD-10-CM | POA: Diagnosis not present

## 2016-12-03 DIAGNOSIS — R0689 Other abnormalities of breathing: Secondary | ICD-10-CM | POA: Diagnosis not present

## 2016-12-03 DIAGNOSIS — R0902 Hypoxemia: Secondary | ICD-10-CM | POA: Diagnosis not present

## 2016-12-03 DIAGNOSIS — J449 Chronic obstructive pulmonary disease, unspecified: Secondary | ICD-10-CM | POA: Diagnosis not present

## 2016-12-16 DIAGNOSIS — J449 Chronic obstructive pulmonary disease, unspecified: Secondary | ICD-10-CM | POA: Diagnosis not present

## 2016-12-17 ENCOUNTER — Telehealth: Payer: Self-pay | Admitting: Internal Medicine

## 2016-12-17 NOTE — Telephone Encounter (Signed)
Pt calling stating she knows she is due in nov  But would like to be seen sooner She is having some issues   Cough and congestion  Last time she had this it went into pneumonia and was placed in ICU  Not wanting to do that again Would like some suggestion   Please advise

## 2016-12-17 NOTE — Telephone Encounter (Signed)
If pt is sick you can schedule her as an acute visit with any provider.

## 2016-12-17 NOTE — Telephone Encounter (Signed)
Thank you.  Pt is scheduled oct 9th with Dr Alva Garnet

## 2016-12-20 DIAGNOSIS — J9601 Acute respiratory failure with hypoxia: Secondary | ICD-10-CM | POA: Diagnosis not present

## 2016-12-20 DIAGNOSIS — I5032 Chronic diastolic (congestive) heart failure: Secondary | ICD-10-CM | POA: Diagnosis not present

## 2016-12-20 DIAGNOSIS — J449 Chronic obstructive pulmonary disease, unspecified: Secondary | ICD-10-CM | POA: Diagnosis not present

## 2016-12-20 DIAGNOSIS — J9620 Acute and chronic respiratory failure, unspecified whether with hypoxia or hypercapnia: Secondary | ICD-10-CM | POA: Diagnosis not present

## 2016-12-20 DIAGNOSIS — J441 Chronic obstructive pulmonary disease with (acute) exacerbation: Secondary | ICD-10-CM | POA: Diagnosis not present

## 2016-12-25 ENCOUNTER — Ambulatory Visit (INDEPENDENT_AMBULATORY_CARE_PROVIDER_SITE_OTHER): Payer: Medicare HMO | Admitting: Pulmonary Disease

## 2016-12-25 ENCOUNTER — Encounter: Payer: Self-pay | Admitting: Pulmonary Disease

## 2016-12-25 VITALS — BP 114/72 | HR 86 | Ht 66.0 in | Wt 219.0 lb

## 2016-12-25 DIAGNOSIS — J449 Chronic obstructive pulmonary disease, unspecified: Secondary | ICD-10-CM | POA: Diagnosis not present

## 2016-12-25 DIAGNOSIS — J9612 Chronic respiratory failure with hypercapnia: Secondary | ICD-10-CM

## 2016-12-25 NOTE — Patient Instructions (Addendum)
Continue current medications and your excellent efforts at maintaining your health  Enjoy your trip to [place] and the [employer] concert  Follow-up with either me or Dr. Mortimer Fries in 3-4 months

## 2016-12-25 NOTE — Progress Notes (Signed)
PULMONARY OFFICE FOLLOW UP NOTE  PROBLEMS:  Chronic hypoxemic/hypercarbic respiratory failure  Chronic BiPAP Moderate COPD Obesity  Severe pneumonia requiring intubation and prolonged hospitalization 04/16-05/08/18  DATA: 08/05/15 ONO: prolonged desaturation with nadir of 79% 09/13/15 PFTs: FVC: 1.77 L (56 %pred), FEV1: 1.27 L (52 %pred), FEV1/FVC: 72, TLC: 5.20 L (98 %pred), DLCO 63 %pred 06/07-07/06/18 compliance report: usage 30/30 nights, > 4 hrs 29/30 nights 09/24/16 LE venous US: no DVT   INTERVAL HISTORY: No major events  SUBJ: This is a routine re-eval requested by the patient and her family to "clear" her for travel to [place]. She plans on attending an [place] concert. Overall, she has been doing extremely well. She is nearly back to her previous baseline. She has no new complaints and denies significant exertional dyspnea, cough, sputum production, hemoptysis, chest pain, lower extremity edema, calf tenderness. She is wearing O2 > 20 hrs/day. She is using BiPAP with sleep and continues to feel that this has been beneficial to her. She remains on Breo inhaler, daily azithromycin. She rarely uses Duoneb. Denies CP, fever, purulent sputum, hemoptysis and calf tenderness.  OBJ: Vitals:   12/25/16 0949 12/25/16 0954  BP:  114/72  Pulse:  86  SpO2:  95%  Weight: 99.3 kg (219 lb)   Height: 5\' 6"  (1.676 m)   2 LPM [place]  Gen: NAD HEENT: NCAT, sclerae white, oropharynx normal Neck: No LAN, no JVD noted Lungs: decreased BS, no wheezes or other adventitious sounds Cardiovascular: Reg, no M noted Abdomen: Obese, soft, NT, +BS Ext: Mild R>L ankle edema Neuro: No focal deficits   DATA: No new CXR  IMPRESSION: COPD, moderate (HCC)  Chronic respiratory failure with hypercapnia (HCC)   Moderate obesity  PLAN: Continue Breo inhaler Continue oxygen therapy at 2 LPM with exertion and sleep Continue nocturnal BiPAP I have approved her planned trip to [place] for [place]  concert Follow-up in 4 months with Dr Mortimer Fries or myself   Merton Border, MD PCCM service Mobile [phone] Pager [phone] 12/25/2016  4:45 PM

## 2017-01-02 DIAGNOSIS — R0902 Hypoxemia: Secondary | ICD-10-CM | POA: Diagnosis not present

## 2017-01-02 DIAGNOSIS — R0689 Other abnormalities of breathing: Secondary | ICD-10-CM | POA: Diagnosis not present

## 2017-01-02 DIAGNOSIS — I509 Heart failure, unspecified: Secondary | ICD-10-CM | POA: Diagnosis not present

## 2017-01-02 DIAGNOSIS — J449 Chronic obstructive pulmonary disease, unspecified: Secondary | ICD-10-CM | POA: Diagnosis not present

## 2017-01-03 DIAGNOSIS — J438 Other emphysema: Secondary | ICD-10-CM | POA: Diagnosis not present

## 2017-01-03 DIAGNOSIS — I10 Essential (primary) hypertension: Secondary | ICD-10-CM | POA: Diagnosis not present

## 2017-01-03 DIAGNOSIS — E7800 Pure hypercholesterolemia, unspecified: Secondary | ICD-10-CM | POA: Diagnosis not present

## 2017-01-03 DIAGNOSIS — R739 Hyperglycemia, unspecified: Secondary | ICD-10-CM | POA: Diagnosis not present

## 2017-01-03 DIAGNOSIS — I5032 Chronic diastolic (congestive) heart failure: Secondary | ICD-10-CM | POA: Diagnosis not present

## 2017-01-03 DIAGNOSIS — G4733 Obstructive sleep apnea (adult) (pediatric): Secondary | ICD-10-CM | POA: Diagnosis not present

## 2017-01-04 DIAGNOSIS — R197 Diarrhea, unspecified: Secondary | ICD-10-CM | POA: Diagnosis not present

## 2017-01-04 DIAGNOSIS — E876 Hypokalemia: Secondary | ICD-10-CM | POA: Diagnosis not present

## 2017-01-04 DIAGNOSIS — R42 Dizziness and giddiness: Secondary | ICD-10-CM | POA: Diagnosis not present

## 2017-01-08 DIAGNOSIS — E876 Hypokalemia: Secondary | ICD-10-CM | POA: Diagnosis not present

## 2017-01-10 DIAGNOSIS — J9611 Chronic respiratory failure with hypoxia: Secondary | ICD-10-CM | POA: Diagnosis not present

## 2017-01-10 DIAGNOSIS — J438 Other emphysema: Secondary | ICD-10-CM | POA: Diagnosis not present

## 2017-01-10 DIAGNOSIS — G4733 Obstructive sleep apnea (adult) (pediatric): Secondary | ICD-10-CM | POA: Diagnosis not present

## 2017-01-10 DIAGNOSIS — R29898 Other symptoms and signs involving the musculoskeletal system: Secondary | ICD-10-CM | POA: Diagnosis not present

## 2017-01-10 DIAGNOSIS — Z23 Encounter for immunization: Secondary | ICD-10-CM | POA: Diagnosis not present

## 2017-01-10 DIAGNOSIS — M4696 Unspecified inflammatory spondylopathy, lumbar region: Secondary | ICD-10-CM | POA: Diagnosis not present

## 2017-01-10 DIAGNOSIS — G63 Polyneuropathy in diseases classified elsewhere: Secondary | ICD-10-CM | POA: Diagnosis not present

## 2017-01-10 DIAGNOSIS — I10 Essential (primary) hypertension: Secondary | ICD-10-CM | POA: Diagnosis not present

## 2017-01-10 DIAGNOSIS — I5032 Chronic diastolic (congestive) heart failure: Secondary | ICD-10-CM | POA: Diagnosis not present

## 2017-01-10 DIAGNOSIS — E034 Atrophy of thyroid (acquired): Secondary | ICD-10-CM | POA: Diagnosis not present

## 2017-01-11 ENCOUNTER — Other Ambulatory Visit: Payer: Self-pay | Admitting: Internal Medicine

## 2017-01-16 DIAGNOSIS — J449 Chronic obstructive pulmonary disease, unspecified: Secondary | ICD-10-CM | POA: Diagnosis not present

## 2017-01-20 DIAGNOSIS — J441 Chronic obstructive pulmonary disease with (acute) exacerbation: Secondary | ICD-10-CM | POA: Diagnosis not present

## 2017-01-20 DIAGNOSIS — J9620 Acute and chronic respiratory failure, unspecified whether with hypoxia or hypercapnia: Secondary | ICD-10-CM | POA: Diagnosis not present

## 2017-01-20 DIAGNOSIS — J9601 Acute respiratory failure with hypoxia: Secondary | ICD-10-CM | POA: Diagnosis not present

## 2017-01-20 DIAGNOSIS — I5032 Chronic diastolic (congestive) heart failure: Secondary | ICD-10-CM | POA: Diagnosis not present

## 2017-01-20 DIAGNOSIS — J449 Chronic obstructive pulmonary disease, unspecified: Secondary | ICD-10-CM | POA: Diagnosis not present

## 2017-01-21 DIAGNOSIS — M25562 Pain in left knee: Secondary | ICD-10-CM | POA: Diagnosis not present

## 2017-01-21 DIAGNOSIS — M7632 Iliotibial band syndrome, left leg: Secondary | ICD-10-CM | POA: Diagnosis not present

## 2017-01-25 DIAGNOSIS — M25511 Pain in right shoulder: Secondary | ICD-10-CM | POA: Diagnosis not present

## 2017-01-25 DIAGNOSIS — G8929 Other chronic pain: Secondary | ICD-10-CM | POA: Diagnosis not present

## 2017-01-25 DIAGNOSIS — J438 Other emphysema: Secondary | ICD-10-CM | POA: Diagnosis not present

## 2017-01-25 DIAGNOSIS — G63 Polyneuropathy in diseases classified elsewhere: Secondary | ICD-10-CM | POA: Diagnosis not present

## 2017-01-25 DIAGNOSIS — J9611 Chronic respiratory failure with hypoxia: Secondary | ICD-10-CM | POA: Diagnosis not present

## 2017-01-29 ENCOUNTER — Other Ambulatory Visit (INDEPENDENT_AMBULATORY_CARE_PROVIDER_SITE_OTHER): Payer: Self-pay | Admitting: Internal Medicine

## 2017-01-29 DIAGNOSIS — R200 Anesthesia of skin: Secondary | ICD-10-CM

## 2017-01-30 ENCOUNTER — Ambulatory Visit (INDEPENDENT_AMBULATORY_CARE_PROVIDER_SITE_OTHER): Payer: Medicare HMO

## 2017-01-30 DIAGNOSIS — R200 Anesthesia of skin: Secondary | ICD-10-CM | POA: Diagnosis not present

## 2017-02-02 DIAGNOSIS — I509 Heart failure, unspecified: Secondary | ICD-10-CM | POA: Diagnosis not present

## 2017-02-02 DIAGNOSIS — J449 Chronic obstructive pulmonary disease, unspecified: Secondary | ICD-10-CM | POA: Diagnosis not present

## 2017-02-02 DIAGNOSIS — R0902 Hypoxemia: Secondary | ICD-10-CM | POA: Diagnosis not present

## 2017-02-02 DIAGNOSIS — R0689 Other abnormalities of breathing: Secondary | ICD-10-CM | POA: Diagnosis not present

## 2017-02-11 DIAGNOSIS — J40 Bronchitis, not specified as acute or chronic: Secondary | ICD-10-CM | POA: Diagnosis not present

## 2017-02-11 DIAGNOSIS — I5032 Chronic diastolic (congestive) heart failure: Secondary | ICD-10-CM | POA: Diagnosis not present

## 2017-02-11 DIAGNOSIS — J438 Other emphysema: Secondary | ICD-10-CM | POA: Diagnosis not present

## 2017-02-11 DIAGNOSIS — G4733 Obstructive sleep apnea (adult) (pediatric): Secondary | ICD-10-CM | POA: Diagnosis not present

## 2017-02-12 DIAGNOSIS — M19011 Primary osteoarthritis, right shoulder: Secondary | ICD-10-CM | POA: Diagnosis not present

## 2017-02-15 DIAGNOSIS — J449 Chronic obstructive pulmonary disease, unspecified: Secondary | ICD-10-CM | POA: Diagnosis not present

## 2017-02-19 DIAGNOSIS — J9601 Acute respiratory failure with hypoxia: Secondary | ICD-10-CM | POA: Diagnosis not present

## 2017-02-19 DIAGNOSIS — J9620 Acute and chronic respiratory failure, unspecified whether with hypoxia or hypercapnia: Secondary | ICD-10-CM | POA: Diagnosis not present

## 2017-02-19 DIAGNOSIS — J441 Chronic obstructive pulmonary disease with (acute) exacerbation: Secondary | ICD-10-CM | POA: Diagnosis not present

## 2017-02-19 DIAGNOSIS — J449 Chronic obstructive pulmonary disease, unspecified: Secondary | ICD-10-CM | POA: Diagnosis not present

## 2017-02-19 DIAGNOSIS — I5032 Chronic diastolic (congestive) heart failure: Secondary | ICD-10-CM | POA: Diagnosis not present

## 2017-02-21 ENCOUNTER — Ambulatory Visit (INDEPENDENT_AMBULATORY_CARE_PROVIDER_SITE_OTHER): Payer: Medicare HMO | Admitting: Internal Medicine

## 2017-02-21 ENCOUNTER — Encounter: Payer: Self-pay | Admitting: Internal Medicine

## 2017-02-21 VITALS — BP 132/80 | HR 83 | Resp 16 | Ht 66.0 in

## 2017-02-21 DIAGNOSIS — J9612 Chronic respiratory failure with hypercapnia: Secondary | ICD-10-CM

## 2017-02-21 DIAGNOSIS — J9611 Chronic respiratory failure with hypoxia: Secondary | ICD-10-CM

## 2017-02-21 NOTE — Patient Instructions (Signed)
Continue inhalers as prescribed Continue with BiPAP

## 2017-02-21 NOTE — Progress Notes (Signed)
[facility] Consultation     Date: 02/21/2017,   MRN# [id number] Hannah Powell 03/17/1946 Code Status:  Code Status History    Date Active Date Inactive Code Status Order ID Comments User Context   07/18/2015  1:24 PM 07/22/2015  3:51 PM Full Code [id number]  Flora Lipps, MD ED   07/02/2013 11:24 AM 07/03/2013  1:35 PM Full Code [id number]  Jenetta Loges, PA-C Inpatient     Hosp day:@LENGTHOFSTAYDAYS @ Referring MD: @ATDPROV @     PCP:      AdmissionWeight: (pt decline weight today)                 CurrentWeight: (pt decline weight today)  CHIEF COMPLAINT:   Follow up COPD Follow up chronic resp failure   HISTORY OF PRESENT ILLNESS   Patient has had long ICU LOS and multiple admissions since last visit Patient with end stage lung disease Now on chronic biPAP Patient is remote smoker, quit 1977 ONO results positive for hypoxia  PFT 09/13/15 Ratio 72% Fev1 52% Fef25/75 42%  Assessment-mod/severe obstructive airways disease End stage hypoxic and hypercapnic resp failure  Patient benefiting and using her BiPAP at night and tolerating well Patient also benefiting in using her oxygen at night and tolerating well  Patient plans on long road trip to [place] to visit family I have cautioned her to take her oxygen and her BiPAP machine as well as avoid sick contacts   Patient has no signs of infection at this time No signs of decompensated heart failure or COPD at this time   Home Medication:   Current Medication:   Current Outpatient Medications:  .  ALPRAZolam (XANAX) 0.25 MG tablet, Take 1 tablet (0.25 mg total) by mouth 3 (three) times daily as needed for anxiety., Disp: 30 tablet, Rfl: 0 .  aspirin EC 81 MG tablet, Take 81 mg by mouth daily., Disp: , Rfl:  .  azithromycin (ZITHROMAX) 250 MG tablet, Take 1 tablet (250 mg total) by mouth daily., Disp: 90 tablet, Rfl: 3 .  B Complex-C (B-COMPLEX WITH VITAMIN C) tablet, Take 1 tablet by mouth daily.,  Disp: , Rfl:  .  Calcium Carbonate-Vitamin D (CALCIUM-VITAMIN D) 500-200 MG-UNIT per tablet, Take 1 tablet by mouth daily., Disp: , Rfl:  .  cyclobenzaprine (FLEXERIL) 10 MG tablet, Take 0.5-1 mg by mouth 3 (three) times daily as needed for muscle spasms., Disp: , Rfl:  .  ferrous sulfate 325 (65 FE) MG tablet, Take 325 mg by mouth daily with breakfast., Disp: , Rfl:  .  fluticasone furoate-vilanterol (BREO ELLIPTA) 200-25 MCG/INH AEPB, Inhale 1 puff into the lungs daily., Disp: 60 each, Rfl: 0 .  furosemide (LASIX) 20 MG tablet, Take 2 tablets (40 mg total) by mouth 2 (two) times daily. (Patient taking differently: Take 40 mg by mouth daily. ), Disp: , Rfl:  .  gabapentin (NEURONTIN) 300 MG capsule, Take 300 mg by mouth 2 (two) times daily., Disp: , Rfl:  .  ipratropium-albuterol (DUONEB) 0.5-2.5 (3) MG/3ML SOLN, Take 3 mLs by nebulization every 4 (four) hours as needed., Disp: , Rfl:  .  levothyroxine (SYNTHROID, LEVOTHROID) 200 MCG tablet, Take 200 mcg by mouth daily before breakfast., Disp: , Rfl:  .  losartan (COZAAR) 25 MG tablet, Take 25 mg by mouth daily., Disp: , Rfl:  .  Omega-3 Fatty Acids (OMEGA 3 PO), Take 1 capsule by mouth daily., Disp: , Rfl:  .  oxybutynin (DITROPAN) 5 MG tablet, Take 5 mg by  mouth 3 (three) times daily., Disp: , Rfl:  .  potassium chloride SA (K-DUR,KLOR-CON) 20 MEQ tablet, Take 20 mEq by mouth daily. , Disp: , Rfl:  .  pramipexole (MIRAPEX) 1 MG tablet, Take 4 mg by mouth at bedtime. , Disp: , Rfl:  .  traMADol (ULTRAM) 50 MG tablet, Take 1 tablet (50 mg total) by mouth every 8 (eight) hours as needed., Disp: 30 tablet, Rfl: 0 .  zolpidem (AMBIEN) 10 MG tablet, Take 5 mg by mouth at bedtime as needed for sleep., Disp: , Rfl:      ALLERGIES   Lyrica [pregabalin]     REVIEW OF SYSTEMS   Review of Systems  Constitutional: Negative for chills, diaphoresis, fever, malaise/fatigue and weight loss.  HENT: Negative for congestion.   Eyes: Negative for  blurred vision and double vision.  Respiratory: Positive for shortness of breath. Negative for cough, hemoptysis, sputum production and wheezing.   Cardiovascular: Positive for leg swelling. Negative for chest pain, palpitations and orthopnea.  Gastrointestinal: Negative for abdominal pain, heartburn, nausea and vomiting.  Neurological: Negative for weakness.  All other systems reviewed and are negative.    VS: BP 132/80 (BP Location: Left Arm, Cuff Size: Large)   Pulse 83   Resp 16   Ht 5\' 6"  (1.676 m)   SpO2 97%   BMI 35.35 kg/m      PHYSICAL EXAM   Physical Exam  Constitutional: She is oriented to person, place, and time. She appears well-developed and well-nourished. No distress.  HENT:  Mouth/Throat: No oropharyngeal exudate.  Cardiovascular: Normal rate, regular rhythm and normal heart sounds.  No murmur heard. Pulmonary/Chest: Effort normal. No stridor. No respiratory distress. She has no wheezes. She has rales.  Musculoskeletal: Normal range of motion. She exhibits edema.  Neurological: She is alert and oriented to person, place, and time. No cranial nerve deficit.  Skin: Skin is warm. She is not diaphoretic.  Psychiatric: She has a normal mood and affect.         ASSESSMENT/PLAN   71 yo white female with Moderate/Severe COPD Grade D with obesity and underlying OSA with nocturnal hypoxia with chronic resp failure with recurrent bouts of COPD exacerbation and multiple prednisone therapies.  Patient with severe end stage lung disease now on chronic biPAP therapy and 3L of oxygen therapy   Resp failure-hypoxia and hypercapnic -continue BiPAP for chronic hypoxic and hypercapnic resp failure -oxygen needed 24/7  COPD-moderate/severe Gold Stage D -continue inhaled steroids/LABA(BREO) - daily azithromycin therapy for COPD exacerbation prevention -dounebs every 4 hrs as needed  Morbid Obesity Unable to lose weight Prognosis is poor  OSA/COPD-needs biPAP for  Survival  Patient is using and benefiting from her daily BiPAP and her daily oxygen support and therapy Continue as prescribed for survival  Follow up in 3 months   Patient/Family are satisfied with Plan of action and management. All questions answered   Corrin Parker, M.D.  [facility]  Medical Director [place] Director [facility] Cardio-Pulmonary Department

## 2017-02-27 DIAGNOSIS — H2513 Age-related nuclear cataract, bilateral: Secondary | ICD-10-CM | POA: Diagnosis not present

## 2017-02-28 DIAGNOSIS — G4733 Obstructive sleep apnea (adult) (pediatric): Secondary | ICD-10-CM | POA: Diagnosis not present

## 2017-02-28 DIAGNOSIS — J9611 Chronic respiratory failure with hypoxia: Secondary | ICD-10-CM | POA: Diagnosis not present

## 2017-02-28 DIAGNOSIS — G63 Polyneuropathy in diseases classified elsewhere: Secondary | ICD-10-CM | POA: Diagnosis not present

## 2017-02-28 DIAGNOSIS — M4696 Unspecified inflammatory spondylopathy, lumbar region: Secondary | ICD-10-CM | POA: Diagnosis not present

## 2017-02-28 DIAGNOSIS — E034 Atrophy of thyroid (acquired): Secondary | ICD-10-CM | POA: Diagnosis not present

## 2017-02-28 DIAGNOSIS — J438 Other emphysema: Secondary | ICD-10-CM | POA: Diagnosis not present

## 2017-02-28 DIAGNOSIS — M159 Polyosteoarthritis, unspecified: Secondary | ICD-10-CM | POA: Diagnosis not present

## 2017-02-28 DIAGNOSIS — I10 Essential (primary) hypertension: Secondary | ICD-10-CM | POA: Diagnosis not present

## 2017-02-28 DIAGNOSIS — I5032 Chronic diastolic (congestive) heart failure: Secondary | ICD-10-CM | POA: Diagnosis not present

## 2017-03-04 DIAGNOSIS — R0902 Hypoxemia: Secondary | ICD-10-CM | POA: Diagnosis not present

## 2017-03-04 DIAGNOSIS — J449 Chronic obstructive pulmonary disease, unspecified: Secondary | ICD-10-CM | POA: Diagnosis not present

## 2017-03-04 DIAGNOSIS — R0689 Other abnormalities of breathing: Secondary | ICD-10-CM | POA: Diagnosis not present

## 2017-03-04 DIAGNOSIS — I509 Heart failure, unspecified: Secondary | ICD-10-CM | POA: Diagnosis not present

## 2017-03-18 DIAGNOSIS — J449 Chronic obstructive pulmonary disease, unspecified: Secondary | ICD-10-CM | POA: Diagnosis not present

## 2017-03-22 DIAGNOSIS — J9620 Acute and chronic respiratory failure, unspecified whether with hypoxia or hypercapnia: Secondary | ICD-10-CM | POA: Diagnosis not present

## 2017-03-22 DIAGNOSIS — J441 Chronic obstructive pulmonary disease with (acute) exacerbation: Secondary | ICD-10-CM | POA: Diagnosis not present

## 2017-03-22 DIAGNOSIS — J9601 Acute respiratory failure with hypoxia: Secondary | ICD-10-CM | POA: Diagnosis not present

## 2017-03-22 DIAGNOSIS — J449 Chronic obstructive pulmonary disease, unspecified: Secondary | ICD-10-CM | POA: Diagnosis not present

## 2017-03-22 DIAGNOSIS — I5032 Chronic diastolic (congestive) heart failure: Secondary | ICD-10-CM | POA: Diagnosis not present

## 2017-03-27 DIAGNOSIS — J449 Chronic obstructive pulmonary disease, unspecified: Secondary | ICD-10-CM | POA: Diagnosis not present

## 2017-03-27 DIAGNOSIS — J9620 Acute and chronic respiratory failure, unspecified whether with hypoxia or hypercapnia: Secondary | ICD-10-CM | POA: Diagnosis not present

## 2017-03-27 DIAGNOSIS — I5032 Chronic diastolic (congestive) heart failure: Secondary | ICD-10-CM | POA: Diagnosis not present

## 2017-03-27 DIAGNOSIS — J9601 Acute respiratory failure with hypoxia: Secondary | ICD-10-CM | POA: Diagnosis not present

## 2017-03-27 DIAGNOSIS — J441 Chronic obstructive pulmonary disease with (acute) exacerbation: Secondary | ICD-10-CM | POA: Diagnosis not present

## 2017-04-04 DIAGNOSIS — R0902 Hypoxemia: Secondary | ICD-10-CM | POA: Diagnosis not present

## 2017-04-04 DIAGNOSIS — J449 Chronic obstructive pulmonary disease, unspecified: Secondary | ICD-10-CM | POA: Diagnosis not present

## 2017-04-04 DIAGNOSIS — I509 Heart failure, unspecified: Secondary | ICD-10-CM | POA: Diagnosis not present

## 2017-04-04 DIAGNOSIS — R0689 Other abnormalities of breathing: Secondary | ICD-10-CM | POA: Diagnosis not present

## 2017-04-15 DIAGNOSIS — G4733 Obstructive sleep apnea (adult) (pediatric): Secondary | ICD-10-CM | POA: Diagnosis not present

## 2017-04-15 DIAGNOSIS — I10 Essential (primary) hypertension: Secondary | ICD-10-CM | POA: Diagnosis not present

## 2017-04-15 DIAGNOSIS — J438 Other emphysema: Secondary | ICD-10-CM | POA: Diagnosis not present

## 2017-04-17 DIAGNOSIS — H25011 Cortical age-related cataract, right eye: Secondary | ICD-10-CM | POA: Diagnosis not present

## 2017-04-18 ENCOUNTER — Encounter: Payer: Self-pay | Admitting: *Deleted

## 2017-04-18 DIAGNOSIS — J449 Chronic obstructive pulmonary disease, unspecified: Secondary | ICD-10-CM | POA: Diagnosis not present

## 2017-04-22 ENCOUNTER — Other Ambulatory Visit: Payer: Self-pay | Admitting: Internal Medicine

## 2017-04-22 DIAGNOSIS — J9601 Acute respiratory failure with hypoxia: Secondary | ICD-10-CM | POA: Diagnosis not present

## 2017-04-22 DIAGNOSIS — Z1231 Encounter for screening mammogram for malignant neoplasm of breast: Secondary | ICD-10-CM

## 2017-04-22 DIAGNOSIS — I5032 Chronic diastolic (congestive) heart failure: Secondary | ICD-10-CM | POA: Diagnosis not present

## 2017-04-22 DIAGNOSIS — I10 Essential (primary) hypertension: Secondary | ICD-10-CM | POA: Diagnosis not present

## 2017-04-22 DIAGNOSIS — E034 Atrophy of thyroid (acquired): Secondary | ICD-10-CM | POA: Diagnosis not present

## 2017-04-22 DIAGNOSIS — M4696 Unspecified inflammatory spondylopathy, lumbar region: Secondary | ICD-10-CM | POA: Diagnosis not present

## 2017-04-22 DIAGNOSIS — G63 Polyneuropathy in diseases classified elsewhere: Secondary | ICD-10-CM | POA: Diagnosis not present

## 2017-04-22 DIAGNOSIS — G4733 Obstructive sleep apnea (adult) (pediatric): Secondary | ICD-10-CM | POA: Diagnosis not present

## 2017-04-22 DIAGNOSIS — J438 Other emphysema: Secondary | ICD-10-CM | POA: Diagnosis not present

## 2017-04-22 DIAGNOSIS — J9620 Acute and chronic respiratory failure, unspecified whether with hypoxia or hypercapnia: Secondary | ICD-10-CM | POA: Diagnosis not present

## 2017-04-22 DIAGNOSIS — J449 Chronic obstructive pulmonary disease, unspecified: Secondary | ICD-10-CM | POA: Diagnosis not present

## 2017-04-22 DIAGNOSIS — Z0000 Encounter for general adult medical examination without abnormal findings: Secondary | ICD-10-CM | POA: Diagnosis not present

## 2017-04-22 DIAGNOSIS — M1710 Unilateral primary osteoarthritis, unspecified knee: Secondary | ICD-10-CM | POA: Diagnosis not present

## 2017-04-22 DIAGNOSIS — J9611 Chronic respiratory failure with hypoxia: Secondary | ICD-10-CM | POA: Diagnosis not present

## 2017-04-22 DIAGNOSIS — J441 Chronic obstructive pulmonary disease with (acute) exacerbation: Secondary | ICD-10-CM | POA: Diagnosis not present

## 2017-04-23 ENCOUNTER — Ambulatory Visit
Admission: RE | Admit: 2017-04-23 | Discharge: 2017-04-23 | Disposition: A | Discharge status: Home / Self Care | Payer: Medicare HMO | Source: Ambulatory Visit | Attending: Ophthalmology | Admitting: Ophthalmology

## 2017-04-23 ENCOUNTER — Ambulatory Visit: Payer: Medicare HMO | Admitting: Certified Registered"

## 2017-04-23 ENCOUNTER — Encounter: Payer: Self-pay | Admitting: *Deleted

## 2017-04-23 ENCOUNTER — Other Ambulatory Visit: Payer: Self-pay

## 2017-04-23 ENCOUNTER — Encounter
Admission: RE | Disposition: A | Discharge status: Home / Self Care | Payer: Self-pay | Source: Ambulatory Visit | Attending: Ophthalmology

## 2017-04-23 DIAGNOSIS — I509 Heart failure, unspecified: Secondary | ICD-10-CM | POA: Insufficient documentation

## 2017-04-23 DIAGNOSIS — Z96619 Presence of unspecified artificial shoulder joint: Secondary | ICD-10-CM | POA: Insufficient documentation

## 2017-04-23 DIAGNOSIS — Z7982 Long term (current) use of aspirin: Secondary | ICD-10-CM | POA: Diagnosis not present

## 2017-04-23 DIAGNOSIS — I110 Hypertensive heart disease with heart failure: Secondary | ICD-10-CM | POA: Insufficient documentation

## 2017-04-23 DIAGNOSIS — H2511 Age-related nuclear cataract, right eye: Secondary | ICD-10-CM | POA: Insufficient documentation

## 2017-04-23 DIAGNOSIS — Z7951 Long term (current) use of inhaled steroids: Secondary | ICD-10-CM | POA: Insufficient documentation

## 2017-04-23 DIAGNOSIS — Z79899 Other long term (current) drug therapy: Secondary | ICD-10-CM | POA: Diagnosis not present

## 2017-04-23 DIAGNOSIS — E039 Hypothyroidism, unspecified: Secondary | ICD-10-CM | POA: Diagnosis not present

## 2017-04-23 DIAGNOSIS — Z791 Long term (current) use of non-steroidal anti-inflammatories (NSAID): Secondary | ICD-10-CM | POA: Diagnosis not present

## 2017-04-23 DIAGNOSIS — Z96659 Presence of unspecified artificial knee joint: Secondary | ICD-10-CM | POA: Diagnosis not present

## 2017-04-23 DIAGNOSIS — G4730 Sleep apnea, unspecified: Secondary | ICD-10-CM | POA: Insufficient documentation

## 2017-04-23 DIAGNOSIS — J449 Chronic obstructive pulmonary disease, unspecified: Secondary | ICD-10-CM | POA: Insufficient documentation

## 2017-04-23 DIAGNOSIS — F419 Anxiety disorder, unspecified: Secondary | ICD-10-CM | POA: Insufficient documentation

## 2017-04-23 DIAGNOSIS — J440 Chronic obstructive pulmonary disease with acute lower respiratory infection: Secondary | ICD-10-CM | POA: Diagnosis not present

## 2017-04-23 DIAGNOSIS — I5032 Chronic diastolic (congestive) heart failure: Secondary | ICD-10-CM | POA: Diagnosis not present

## 2017-04-23 DIAGNOSIS — Z9981 Dependence on supplemental oxygen: Secondary | ICD-10-CM | POA: Diagnosis not present

## 2017-04-23 DIAGNOSIS — Z87891 Personal history of nicotine dependence: Secondary | ICD-10-CM | POA: Insufficient documentation

## 2017-04-23 DIAGNOSIS — J189 Pneumonia, unspecified organism: Secondary | ICD-10-CM | POA: Diagnosis not present

## 2017-04-23 HISTORY — DX: Hypoxemia: R09.02

## 2017-04-23 HISTORY — DX: Edema, unspecified: R60.9

## 2017-04-23 HISTORY — DX: Wheezing: R06.2

## 2017-04-23 HISTORY — DX: Other symptoms and signs involving the musculoskeletal system: R29.898

## 2017-04-23 HISTORY — PX: CATARACT EXTRACTION W/PHACO: SHX586

## 2017-04-23 SURGERY — PHACOEMULSIFICATION, CATARACT, WITH IOL INSERTION
Anesthesia: Monitor Anesthesia Care | Site: Eye | Laterality: Right | Wound class: Clean

## 2017-04-23 MED ORDER — FENTANYL CITRATE (PF) 100 MCG/2ML IJ SOLN
INTRAMUSCULAR | Status: DC | PRN
  Administered 2017-04-23: 50 ug via INTRAVENOUS

## 2017-04-23 MED ORDER — MIDAZOLAM HCL 2 MG/2ML IJ SOLN
INTRAMUSCULAR | Status: DC | PRN
  Administered 2017-04-23: 1 mg via INTRAVENOUS

## 2017-04-23 MED ORDER — NA CHONDROIT SULF-NA HYALURON 40-17 MG/ML IO SOLN
INTRAOCULAR | Status: AC
  Filled 2017-04-23: qty 1

## 2017-04-23 MED ORDER — MOXIFLOXACIN HCL 0.5 % OP SOLN
OPHTHALMIC | Status: AC
  Filled 2017-04-23: qty 3

## 2017-04-23 MED ORDER — EPINEPHRINE PF 1 MG/ML IJ SOLN
INTRAOCULAR | Status: DC | PRN
  Administered 2017-04-23: 08:00:00 via OPHTHALMIC

## 2017-04-23 MED ORDER — MIDAZOLAM HCL 2 MG/2ML IJ SOLN
INTRAMUSCULAR | Status: AC
  Administered 2017-04-23: 1 mg via INTRAVENOUS
  Filled 2017-04-23: qty 2

## 2017-04-23 MED ORDER — NA CHONDROIT SULF-NA HYALURON 40-17 MG/ML IO SOLN
INTRAOCULAR | Status: DC | PRN
  Administered 2017-04-23: 1 mL via INTRAOCULAR

## 2017-04-23 MED ORDER — CARBACHOL 0.01 % IO SOLN
INTRAOCULAR | Status: DC | PRN
  Administered 2017-04-23: 0.5 mL via INTRAOCULAR

## 2017-04-23 MED ORDER — ARMC OPHTHALMIC DILATING DROPS
1.0000 "application " | OPHTHALMIC | Status: AC
  Administered 2017-04-23 (×3): 1 via OPHTHALMIC

## 2017-04-23 MED ORDER — POLYMYXIN B-TRIMETHOPRIM 10000-0.1 UNIT/ML-% OP SOLN
OPHTHALMIC | Status: DC | PRN
  Administered 2017-04-23: 1 [drp]

## 2017-04-23 MED ORDER — MIDAZOLAM HCL 2 MG/2ML IJ SOLN
INTRAMUSCULAR | Status: AC
  Filled 2017-04-23: qty 2

## 2017-04-23 MED ORDER — ONDANSETRON HCL 4 MG/2ML IJ SOLN: 4.0000 mg | Freq: Once | INTRAMUSCULAR | Status: DC | PRN

## 2017-04-23 MED ORDER — MOXIFLOXACIN HCL 0.5 % OP SOLN: 1.0000 [drp] | OPHTHALMIC | Status: DC | PRN

## 2017-04-23 MED ORDER — LIDOCAINE HCL (PF) 4 % IJ SOLN
INTRAMUSCULAR | Status: AC
  Filled 2017-04-23: qty 5

## 2017-04-23 MED ORDER — POVIDONE-IODINE 5 % OP SOLN
OPHTHALMIC | Status: AC
  Filled 2017-04-23: qty 30

## 2017-04-23 MED ORDER — FENTANYL CITRATE (PF) 100 MCG/2ML IJ SOLN
INTRAMUSCULAR | Status: AC
  Filled 2017-04-23: qty 2

## 2017-04-23 MED ORDER — SODIUM CHLORIDE 0.9 % IV SOLN
INTRAVENOUS | Status: DC
  Administered 2017-04-23: 08:00:00 via INTRAVENOUS

## 2017-04-23 MED ORDER — LIDOCAINE HCL (PF) 4 % IJ SOLN
INTRAOCULAR | Status: DC | PRN
  Administered 2017-04-23: 4 mL via OPHTHALMIC

## 2017-04-23 MED ORDER — POVIDONE-IODINE 5 % OP SOLN
OPHTHALMIC | Status: DC | PRN
  Administered 2017-04-23: 1 via OPHTHALMIC

## 2017-04-23 MED ORDER — EPINEPHRINE PF 1 MG/ML IJ SOLN
INTRAMUSCULAR | Status: AC
  Filled 2017-04-23: qty 2

## 2017-04-23 MED ORDER — FENTANYL CITRATE (PF) 100 MCG/2ML IJ SOLN: 25.0000 ug | INTRAMUSCULAR | Status: DC | PRN

## 2017-04-23 MED ORDER — MIDAZOLAM HCL 2 MG/2ML IJ SOLN
1.0000 mg | Freq: Once | INTRAMUSCULAR | Status: AC
  Administered 2017-04-23: 1 mg via INTRAVENOUS

## 2017-04-23 MED ORDER — ARMC OPHTHALMIC DILATING DROPS
OPHTHALMIC | Status: AC
  Administered 2017-04-23: 1 via OPHTHALMIC
  Filled 2017-04-23: qty 0.4

## 2017-04-23 MED ORDER — MOXIFLOXACIN HCL 0.5 % OP SOLN
OPHTHALMIC | Status: DC | PRN
  Administered 2017-04-23: 0.2 mL via OPHTHALMIC

## 2017-04-23 SURGICAL SUPPLY — 16 items
GLOVE BIO SURGEON STRL SZ8 (GLOVE) ×2 IMPLANT
GLOVE BIOGEL M 6.5 STRL (GLOVE) ×2 IMPLANT
GLOVE SURG LX 8.0 MICRO (GLOVE) ×1
GLOVE SURG LX STRL 8.0 MICRO (GLOVE) ×1 IMPLANT
GOWN STRL REUS W/ TWL LRG LVL3 (GOWN DISPOSABLE) ×2 IMPLANT
GOWN STRL REUS W/TWL LRG LVL3 (GOWN DISPOSABLE) ×4
LABEL CATARACT MEDS ST (LABEL) ×2 IMPLANT
LENS IOL TECNIS ITEC 22.0 (Intraocular Lens) ×1 IMPLANT
PACK CATARACT (MISCELLANEOUS) ×2 IMPLANT
PACK CATARACT BRASINGTON LX (MISCELLANEOUS) ×2 IMPLANT
PACK EYE AFTER SURG (MISCELLANEOUS) ×2 IMPLANT
SOL BSS BAG (MISCELLANEOUS) ×2
SOLUTION BSS BAG (MISCELLANEOUS) ×1 IMPLANT
SYR 5ML LL (SYRINGE) ×2 IMPLANT
WATER STERILE IRR 250ML POUR (IV SOLUTION) ×2 IMPLANT
WIPE NON LINTING 3.25X3.25 (MISCELLANEOUS) ×2 IMPLANT

## 2017-04-23 NOTE — H&P (Signed)
All labs reviewed. Abnormal studies sent to patients PCP when indicated.  Previous H&P reviewed, patient examined, there are NO CHANGES.  Hannah Cancio Porfilio2/5/20198:13 AM

## 2017-04-23 NOTE — Discharge Instructions (Signed)
Eye Surgery Discharge Instructions  Expect mild scratchy sensation or mild soreness. DO NOT RUB YOUR EYE!  The day of surgery:  Minimal physical activity, but bed rest is not required  No reading, computer work, or close hand work  No bending, lifting, or straining.  May watch TV  For 24 hours:  No driving, legal decisions, or alcoholic beverages  Safety precautions  Eat anything you prefer: It is better to start with liquids, then soup then solid foods.  _____ Eye patch should be worn until postoperative exam tomorrow.  ____ Solar shield eyeglasses should be worn for comfort in the sunlight/patch while sleeping  Resume all regular medications including aspirin or Coumadin if these were discontinued prior to surgery. You may shower, bathe, shave, or wash your hair. Tylenol may be taken for mild discomfort.  Call your doctor if you experience significant pain, nausea, or vomiting, fever > 101 or other signs of infection. [phone] or [phone] Specific instructions:  Follow-up Information    Birder Robson, MD Follow up.   Specialty:  Ophthalmology Why:  February 6 at 10:35am Contact information: [address] [place] [address] [phone]

## 2017-04-23 NOTE — Anesthesia Postprocedure Evaluation (Signed)
Anesthesia Post Note  Patient: Hannah Powell  Procedure(s) Performed: CATARACT EXTRACTION PHACO AND INTRAOCULAR LENS PLACEMENT ([place]) (Right Eye)  Patient location during evaluation: PACU Anesthesia Type: MAC Level of consciousness: awake, awake and alert and oriented Pain management: pain level controlled Vital Signs Assessment: post-procedure vital signs reviewed and stable Respiratory status: spontaneous breathing, nonlabored ventilation and respiratory function stable Cardiovascular status: stable Anesthetic complications: no     Last Vitals:  Vitals:   04/23/17 0719 04/23/17 0840  BP: 127/65 109/67  Pulse: 85   Resp: 14 11  Temp: 36.8 C   SpO2: 97% 100%    Last Pain:  Vitals:   04/23/17 0719  TempSrc: Oral                 Lance Muss

## 2017-04-23 NOTE — Transfer of Care (Signed)
Immediate Anesthesia Transfer of Care Note  Patient: Hannah Powell  Procedure(s) Performed: CATARACT EXTRACTION PHACO AND INTRAOCULAR LENS PLACEMENT ([facility]) (Right Eye)  Patient Location: PACU  Anesthesia Type:MAC  Level of Consciousness: awake, alert  and oriented  Airway & Oxygen Therapy: Patient Spontanous Breathing  Post-op Assessment: Report given to RN and Post -op Vital signs reviewed and stable  Post vital signs: Reviewed and stable  Last Vitals:  Vitals:   04/23/17 0719 04/23/17 0840  BP: 127/65 109/67  Pulse: 85   Resp: 14 11  Temp: 36.8 C   SpO2: 97% 100%    Last Pain:  Vitals:   04/23/17 0719  TempSrc: Oral         Complications: No apparent anesthesia complications

## 2017-04-23 NOTE — Anesthesia Post-op Follow-up Note (Signed)
Anesthesia QCDR form completed.        

## 2017-04-23 NOTE — Anesthesia Preprocedure Evaluation (Signed)
Anesthesia Evaluation  Patient identified by MRN, date of birth, ID band Patient awake    Reviewed: Allergy & Precautions, H&P , NPO status , Patient's Chart, lab work & pertinent test results  Airway Mallampati: I  TM Distance: >3 FB     Dental  (+) Teeth Intact, Caps   Pulmonary shortness of breath, with exertion and Long-Term Oxygen Therapy, sleep apnea and Continuous Positive Airway Pressure Ventilation , pneumonia, resolved, COPD,  COPD inhaler, former smoker,    breath sounds clear to auscultation       Cardiovascular hypertension, +CHF   Rhythm:Regular Rate:Normal     Neuro/Psych PSYCHIATRIC DISORDERS Anxiety negative neurological ROS     GI/Hepatic negative GI ROS,   Endo/Other  Hypothyroidism   Renal/GU Renal InsufficiencyRenal disease     Musculoskeletal  (+) Arthritis , Osteoarthritis,    Abdominal (+) + obese,   Peds negative pediatric ROS (+)  Hematology negative hematology ROS (+)   Anesthesia Other Findings   Reproductive/Obstetrics                             Anesthesia Physical  Anesthesia Plan  ASA: III  Anesthesia Plan: MAC   Post-op Pain Management:    Induction: Intravenous  PONV Risk Score and Plan:   Airway Management Planned: Nasal Cannula  Additional Equipment:   Intra-op Plan:   Post-operative Plan:   Informed Consent: I have reviewed the patients History and Physical, chart, labs and discussed the procedure including the risks, benefits and alternatives for the proposed anesthesia with the patient or authorized representative who has indicated his/her understanding and acceptance.   Dental advisory given  Plan Discussed with: CRNA and Anesthesiologist  Anesthesia Plan Comments:         Anesthesia Quick Evaluation

## 2017-04-23 NOTE — Op Note (Signed)
PREOPERATIVE DIAGNOSIS:  Nuclear sclerotic cataract of the right eye.   POSTOPERATIVE DIAGNOSIS:  NUCLEAR SCLEROTIC CATARACT RIGHT EYE   OPERATIVE PROCEDURE: Procedure(s): CATARACT EXTRACTION PHACO AND INTRAOCULAR LENS PLACEMENT ([facility])   SURGEON:  Birder Robson, MD.   ANESTHESIA:  Anesthesiologist: Alvin Critchley, MD CRNA: Lance Muss, CRNA  1.      Managed anesthesia care. 2.      0.56ml of Shugarcaine was instilled in the eye following the paracentesis.   COMPLICATIONS:  None.   TECHNIQUE:   Stop and chop   DESCRIPTION OF PROCEDURE:  The patient was examined and consented in the preoperative holding area where the aforementioned topical anesthesia was applied to the right eye and then brought back to the Operating Room where the right eye was prepped and draped in the usual sterile ophthalmic fashion and a lid speculum was placed. A paracentesis was created with the side port blade and the anterior chamber was filled with viscoelastic. A near clear corneal incision was performed with the steel keratome. A continuous curvilinear capsulorrhexis was performed with a cystotome followed by the capsulorrhexis forceps. Hydrodissection and hydrodelineation were carried out with BSS on a blunt cannula. The lens was removed in a stop and chop  technique and the remaining cortical material was removed with the irrigation-aspiration handpiece. The capsular bag was inflated with viscoelastic and the Technis ZCB00  lens was placed in the capsular bag without complication. The remaining viscoelastic was removed from the eye with the irrigation-aspiration handpiece. The wounds were hydrated. The anterior chamber was flushed with Miostat and the eye was inflated to physiologic pressure. 0.13ml of Vigamox was placed in the anterior chamber. The wounds were found to be water tight. The eye was dressed with Vigamox. The patient was given protective glasses to wear throughout the day and a shield with which to  sleep tonight. The patient was also given drops with which to begin a drop regimen today and will follow-up with me in one day. * No implants in log * Procedure(s) with comments: CATARACT EXTRACTION PHACO AND INTRAOCULAR LENS PLACEMENT ([facility]) (Right) - [place] 00:29 AP% 12.8 CDE 3.79 FLUID PACK LOT # [id number]  Electronically signed: Birder Robson 04/23/2017 8:39 AM

## 2017-04-25 ENCOUNTER — Encounter: Payer: Self-pay | Admitting: Ophthalmology

## 2017-04-29 ENCOUNTER — Ambulatory Visit: Payer: Medicare HMO

## 2017-05-02 DIAGNOSIS — H2512 Age-related nuclear cataract, left eye: Secondary | ICD-10-CM | POA: Diagnosis not present

## 2017-05-03 ENCOUNTER — Encounter: Payer: Self-pay | Admitting: *Deleted

## 2017-05-05 DIAGNOSIS — J449 Chronic obstructive pulmonary disease, unspecified: Secondary | ICD-10-CM | POA: Diagnosis not present

## 2017-05-05 DIAGNOSIS — R0902 Hypoxemia: Secondary | ICD-10-CM | POA: Diagnosis not present

## 2017-05-05 DIAGNOSIS — R0689 Other abnormalities of breathing: Secondary | ICD-10-CM | POA: Diagnosis not present

## 2017-05-05 DIAGNOSIS — I509 Heart failure, unspecified: Secondary | ICD-10-CM | POA: Diagnosis not present

## 2017-05-07 ENCOUNTER — Ambulatory Visit: Payer: Medicare HMO | Admitting: Certified Registered Nurse Anesthetist

## 2017-05-07 ENCOUNTER — Encounter: Payer: Self-pay | Admitting: *Deleted

## 2017-05-07 ENCOUNTER — Ambulatory Visit
Admission: RE | Admit: 2017-05-07 | Discharge: 2017-05-07 | Disposition: A | Discharge status: Home / Self Care | Payer: Medicare HMO | Source: Ambulatory Visit | Attending: Ophthalmology | Admitting: Ophthalmology

## 2017-05-07 ENCOUNTER — Other Ambulatory Visit: Payer: Self-pay

## 2017-05-07 ENCOUNTER — Encounter
Admission: RE | Disposition: A | Discharge status: Home / Self Care | Payer: Self-pay | Source: Ambulatory Visit | Attending: Ophthalmology

## 2017-05-07 DIAGNOSIS — Z96619 Presence of unspecified artificial shoulder joint: Secondary | ICD-10-CM | POA: Diagnosis not present

## 2017-05-07 DIAGNOSIS — M810 Age-related osteoporosis without current pathological fracture: Secondary | ICD-10-CM | POA: Diagnosis not present

## 2017-05-07 DIAGNOSIS — Z96659 Presence of unspecified artificial knee joint: Secondary | ICD-10-CM | POA: Insufficient documentation

## 2017-05-07 DIAGNOSIS — I509 Heart failure, unspecified: Secondary | ICD-10-CM | POA: Diagnosis not present

## 2017-05-07 DIAGNOSIS — H2512 Age-related nuclear cataract, left eye: Secondary | ICD-10-CM | POA: Diagnosis not present

## 2017-05-07 DIAGNOSIS — N289 Disorder of kidney and ureter, unspecified: Secondary | ICD-10-CM | POA: Diagnosis not present

## 2017-05-07 DIAGNOSIS — Z87891 Personal history of nicotine dependence: Secondary | ICD-10-CM | POA: Insufficient documentation

## 2017-05-07 DIAGNOSIS — I5032 Chronic diastolic (congestive) heart failure: Secondary | ICD-10-CM | POA: Diagnosis not present

## 2017-05-07 DIAGNOSIS — I110 Hypertensive heart disease with heart failure: Secondary | ICD-10-CM | POA: Diagnosis not present

## 2017-05-07 DIAGNOSIS — R062 Wheezing: Secondary | ICD-10-CM | POA: Insufficient documentation

## 2017-05-07 DIAGNOSIS — R2243 Localized swelling, mass and lump, lower limb, bilateral: Secondary | ICD-10-CM | POA: Insufficient documentation

## 2017-05-07 DIAGNOSIS — G4730 Sleep apnea, unspecified: Secondary | ICD-10-CM | POA: Diagnosis not present

## 2017-05-07 DIAGNOSIS — Z9841 Cataract extraction status, right eye: Secondary | ICD-10-CM | POA: Diagnosis not present

## 2017-05-07 DIAGNOSIS — E039 Hypothyroidism, unspecified: Secondary | ICD-10-CM | POA: Insufficient documentation

## 2017-05-07 DIAGNOSIS — J449 Chronic obstructive pulmonary disease, unspecified: Secondary | ICD-10-CM | POA: Insufficient documentation

## 2017-05-07 DIAGNOSIS — J441 Chronic obstructive pulmonary disease with (acute) exacerbation: Secondary | ICD-10-CM | POA: Diagnosis not present

## 2017-05-07 HISTORY — PX: CATARACT EXTRACTION W/PHACO: SHX586

## 2017-05-07 HISTORY — DX: Age-related osteoporosis without current pathological fracture: M81.0

## 2017-05-07 SURGERY — PHACOEMULSIFICATION, CATARACT, WITH IOL INSERTION
Anesthesia: Monitor Anesthesia Care | Site: Eye | Laterality: Left | Wound class: Clean

## 2017-05-07 MED ORDER — LIDOCAINE HCL (PF) 4 % IJ SOLN
INTRAMUSCULAR | Status: DC | PRN
  Administered 2017-05-07: 4 mL via OPHTHALMIC

## 2017-05-07 MED ORDER — NA CHONDROIT SULF-NA HYALURON 40-17 MG/ML IO SOLN
INTRAOCULAR | Status: DC | PRN
  Administered 2017-05-07: 1 mL via INTRAOCULAR

## 2017-05-07 MED ORDER — FENTANYL CITRATE (PF) 100 MCG/2ML IJ SOLN
INTRAMUSCULAR | Status: AC
  Filled 2017-05-07: qty 2

## 2017-05-07 MED ORDER — MOXIFLOXACIN HCL 0.5 % OP SOLN
OPHTHALMIC | Status: DC | PRN
  Administered 2017-05-07: 0.2 mL via OPHTHALMIC

## 2017-05-07 MED ORDER — ARMC OPHTHALMIC DILATING DROPS
1.0000 "application " | Freq: Once | OPHTHALMIC | Status: AC
  Administered 2017-05-07: 1 via OPHTHALMIC

## 2017-05-07 MED ORDER — POVIDONE-IODINE 5 % OP SOLN
OPHTHALMIC | Status: AC
  Filled 2017-05-07: qty 30

## 2017-05-07 MED ORDER — MIDAZOLAM HCL 2 MG/2ML IJ SOLN
INTRAMUSCULAR | Status: DC | PRN
  Administered 2017-05-07: 1 mg via INTRAVENOUS

## 2017-05-07 MED ORDER — FENTANYL CITRATE (PF) 100 MCG/2ML IJ SOLN
INTRAMUSCULAR | Status: DC | PRN
  Administered 2017-05-07: 25 ug via INTRAVENOUS
  Administered 2017-05-07: 75 ug via INTRAVENOUS

## 2017-05-07 MED ORDER — EPINEPHRINE PF 1 MG/ML IJ SOLN
INTRAMUSCULAR | Status: AC
  Filled 2017-05-07: qty 2

## 2017-05-07 MED ORDER — MOXIFLOXACIN HCL 0.5 % OP SOLN
OPHTHALMIC | Status: AC
  Filled 2017-05-07: qty 3

## 2017-05-07 MED ORDER — NA CHONDROIT SULF-NA HYALURON 40-17 MG/ML IO SOLN
INTRAOCULAR | Status: AC
  Filled 2017-05-07: qty 1

## 2017-05-07 MED ORDER — BSS IO SOLN
INTRAOCULAR | Status: DC | PRN
  Administered 2017-05-07: 09:00:00 via OPHTHALMIC

## 2017-05-07 MED ORDER — MIDAZOLAM HCL 2 MG/2ML IJ SOLN
INTRAMUSCULAR | Status: AC
  Filled 2017-05-07: qty 2

## 2017-05-07 MED ORDER — POVIDONE-IODINE 5 % OP SOLN
OPHTHALMIC | Status: DC | PRN
  Administered 2017-05-07: 1 via OPHTHALMIC

## 2017-05-07 MED ORDER — ARMC OPHTHALMIC DILATING DROPS
OPHTHALMIC | Status: AC
  Administered 2017-05-07: 09:00:00
  Filled 2017-05-07: qty 0.4

## 2017-05-07 MED ORDER — SODIUM CHLORIDE 0.9 % IV SOLN
INTRAVENOUS | Status: DC
  Administered 2017-05-07: 08:00:00 via INTRAVENOUS

## 2017-05-07 MED ORDER — MOXIFLOXACIN HCL 0.5 % OP SOLN: 1.0000 [drp] | Freq: Once | OPHTHALMIC | Status: DC

## 2017-05-07 MED ORDER — LIDOCAINE HCL (PF) 4 % IJ SOLN
INTRAMUSCULAR | Status: AC
  Filled 2017-05-07: qty 5

## 2017-05-07 MED ORDER — CARBACHOL 0.01 % IO SOLN
INTRAOCULAR | Status: DC | PRN
  Administered 2017-05-07: 0.5 mL via INTRAOCULAR

## 2017-05-07 SURGICAL SUPPLY — 16 items
GLOVE BIO SURGEON STRL SZ8 (GLOVE) ×2 IMPLANT
GLOVE BIOGEL M 6.5 STRL (GLOVE) ×2 IMPLANT
GLOVE SURG LX 8.0 MICRO (GLOVE) ×1
GLOVE SURG LX STRL 8.0 MICRO (GLOVE) ×1 IMPLANT
GOWN STRL REUS W/ TWL LRG LVL3 (GOWN DISPOSABLE) ×2 IMPLANT
GOWN STRL REUS W/TWL LRG LVL3 (GOWN DISPOSABLE) ×4
LABEL CATARACT MEDS ST (LABEL) ×2 IMPLANT
LENS IOL TECNIS ITEC 21.5 (Intraocular Lens) ×1 IMPLANT
PACK CATARACT (MISCELLANEOUS) ×2 IMPLANT
PACK CATARACT BRASINGTON LX (MISCELLANEOUS) ×2 IMPLANT
PACK EYE AFTER SURG (MISCELLANEOUS) ×2 IMPLANT
SOL BSS BAG (MISCELLANEOUS) ×2
SOLUTION BSS BAG (MISCELLANEOUS) ×1 IMPLANT
SYR 5ML LL (SYRINGE) ×2 IMPLANT
WATER STERILE IRR 250ML POUR (IV SOLUTION) ×2 IMPLANT
WIPE NON LINTING 3.25X3.25 (MISCELLANEOUS) ×2 IMPLANT

## 2017-05-07 NOTE — Discharge Instructions (Signed)
Follow Dr. Inda Coke postop eye drop instruction sheet as reviewed.  Eye Surgery Discharge Instructions  Expect mild scratchy sensation or mild soreness. DO NOT RUB YOUR EYE!  The day of surgery:  Minimal physical activity, but bed rest is not required  No reading, computer work, or close hand work  No bending, lifting, or straining.  May watch TV  For 24 hours:  No driving, legal decisions, or alcoholic beverages  Safety precautions  Eat anything you prefer: It is better to start with liquids, then soup then solid foods.  _____ Eye patch should be worn until postoperative exam tomorrow.  ____ Solar shield eyeglasses should be worn for comfort in the sunlight/patch while sleeping  Resume all regular medications including aspirin or Coumadin if these were discontinued prior to surgery. You may shower, bathe, shave, or wash your hair. Tylenol may be taken for mild discomfort.  Call your doctor if you experience significant pain, nausea, or vomiting, fever > 101 or other signs of infection. [phone] or [phone] Specific instructions:  Follow-up Information    Birder Robson, MD Follow up.   Specialty:  Ophthalmology Why:  05/08/17 @ 9:30 am Contact information: [address] [place] [address] [phone]

## 2017-05-07 NOTE — Op Note (Signed)
PREOPERATIVE DIAGNOSIS:  Nuclear sclerotic cataract of the left eye.   POSTOPERATIVE DIAGNOSIS:  Nuclear sclerotic cataract of the left eye.   OPERATIVE PROCEDURE: Procedure(s): CATARACT EXTRACTION PHACO AND INTRAOCULAR LENS PLACEMENT ([facility])   SURGEON:  Birder Robson, MD.   ANESTHESIA:  No anesthesia staff entered.  1.      Managed anesthesia care. 2.     0.51ml of Shugarcaine was instilled following the paracentesis   COMPLICATIONS:  None.   TECHNIQUE:   Stop and chop   DESCRIPTION OF PROCEDURE:  The patient was examined and consented in the preoperative holding area where the aforementioned topical anesthesia was applied to the left eye and then brought back to the Operating Room where the left eye was prepped and draped in the usual sterile ophthalmic fashion and a lid speculum was placed. A paracentesis was created with the side port blade and the anterior chamber was filled with viscoelastic. A near clear corneal incision was performed with the steel keratome. A continuous curvilinear capsulorrhexis was performed with a cystotome followed by the capsulorrhexis forceps. Hydrodissection and hydrodelineation were carried out with BSS on a blunt cannula. The lens was removed in a stop and chop  technique and the remaining cortical material was removed with the irrigation-aspiration handpiece. The capsular bag was inflated with viscoelastic and the Technis ZCB00 lens was placed in the capsular bag without complication. The remaining viscoelastic was removed from the eye with the irrigation-aspiration handpiece. The wounds were hydrated. The anterior chamber was flushed with Miostat and the eye was inflated to physiologic pressure. 0.89ml Vigamox was placed in the anterior chamber. The wounds were found to be water tight. The eye was dressed with Vigamox. The patient was given protective glasses to wear throughout the day and a shield with which to sleep tonight. The patient was also given drops  with which to begin a drop regimen today and will follow-up with me in one day. Implant Name Type Inv. Item Serial No. Manufacturer Lot No. LRB No. Used  LENS IOL DIOP 21.5 - [id number] Intraocular Lens LENS IOL DIOP 21.5 [id number]  Left 1    Procedure(s) with comments: CATARACT EXTRACTION PHACO AND INTRAOCULAR LENS PLACEMENT ([facility]) (Left) - [place] 00:48.7 AP% 11.3 CDE 5.46 Fluid Pack Lot # [id number]  Electronically signed: Birder Robson 05/07/2017 9:33 AM

## 2017-05-07 NOTE — Anesthesia Postprocedure Evaluation (Signed)
Anesthesia Post Note  Patient: Hannah Powell  Procedure(s) Performed: CATARACT EXTRACTION PHACO AND INTRAOCULAR LENS PLACEMENT ([facility]) (Left Eye)  Patient location during evaluation: PACU Anesthesia Type: MAC Level of consciousness: awake and alert and oriented Pain management: satisfactory to patient Vital Signs Assessment: post-procedure vital signs reviewed and stable Respiratory status: respiratory function stable Cardiovascular status: stable Anesthetic complications: no     Last Vitals:  Vitals:   05/07/17 0808  BP: 116/62  Pulse: 86  Resp: 16  Temp: 36.8 C  SpO2: 96%    Last Pain:  Vitals:   05/07/17 0808  TempSrc: Temporal                 Blima Singer

## 2017-05-07 NOTE — Transfer of Care (Signed)
Immediate Anesthesia Transfer of Care Note  Patient: Hannah Powell  Procedure(s) Performed: CATARACT EXTRACTION PHACO AND INTRAOCULAR LENS PLACEMENT ([facility]) (Left Eye)  Patient Location: PACU  Anesthesia Type:MAC  Level of Consciousness: awake, alert  and oriented  Airway & Oxygen Therapy: Patient Spontanous Breathing  Post-op Assessment: Report given to RN and Post -op Vital signs reviewed and stable  Post vital signs: Reviewed and stable  Last Vitals:  Vitals:   05/07/17 0808  BP: 116/62  Pulse: 86  Resp: 16  Temp: 36.8 C  SpO2: 96%    Last Pain:  Vitals:   05/07/17 0808  TempSrc: Temporal         Complications: No apparent anesthesia complications

## 2017-05-07 NOTE — Anesthesia Preprocedure Evaluation (Signed)
Anesthesia Evaluation  Patient identified by MRN, date of birth, ID band Patient awake    Reviewed: Allergy & Precautions, H&P , NPO status , reviewed documented beta blocker date and time   Airway Mallampati: II  TM Distance: >3 FB     Dental  (+) Teeth Intact   Pulmonary shortness of breath, sleep apnea , pneumonia, COPD, former smoker,    Pulmonary exam normal        Cardiovascular hypertension, +CHF  Normal cardiovascular exam     Neuro/Psych    GI/Hepatic   Endo/Other  Hypothyroidism   Renal/GU Renal disease     Musculoskeletal   Abdominal   Peds  Hematology   Anesthesia Other Findings   Reproductive/Obstetrics                             Anesthesia Physical Anesthesia Plan  ASA: III  Anesthesia Plan: MAC   Post-op Pain Management:    Induction:   PONV Risk Score and Plan: Propofol infusion, Midazolam and Ondansetron  Airway Management Planned:   Additional Equipment:   Intra-op Plan:   Post-operative Plan:   Informed Consent: I have reviewed the patients History and Physical, chart, labs and discussed the procedure including the risks, benefits and alternatives for the proposed anesthesia with the patient or authorized representative who has indicated his/her understanding and acceptance.   Dental Advisory Given  Plan Discussed with: CRNA  Anesthesia Plan Comments:         Anesthesia Quick Evaluation

## 2017-05-07 NOTE — H&P (Signed)
All labs reviewed. Abnormal studies sent to patients PCP when indicated.  Previous H&P reviewed, patient examined, there are NO CHANGES.  Hannah Powell Porfilio2/19/20199:09 AM

## 2017-05-07 NOTE — Anesthesia Procedure Notes (Signed)
Procedure Name: MAC Performed by: Demetrius Charity, CRNA Pre-anesthesia Checklist: Patient identified, Emergency Drugs available, Suction available, Patient being monitored and Timeout performed Oxygen Delivery Method: Nasal cannula

## 2017-05-07 NOTE — Anesthesia Post-op Follow-up Note (Signed)
Anesthesia QCDR form completed.        

## 2017-05-16 DIAGNOSIS — J449 Chronic obstructive pulmonary disease, unspecified: Secondary | ICD-10-CM | POA: Diagnosis not present

## 2017-05-20 DIAGNOSIS — J449 Chronic obstructive pulmonary disease, unspecified: Secondary | ICD-10-CM | POA: Diagnosis not present

## 2017-05-20 DIAGNOSIS — I5032 Chronic diastolic (congestive) heart failure: Secondary | ICD-10-CM | POA: Diagnosis not present

## 2017-05-20 DIAGNOSIS — J9601 Acute respiratory failure with hypoxia: Secondary | ICD-10-CM | POA: Diagnosis not present

## 2017-05-20 DIAGNOSIS — J441 Chronic obstructive pulmonary disease with (acute) exacerbation: Secondary | ICD-10-CM | POA: Diagnosis not present

## 2017-05-20 DIAGNOSIS — I10 Essential (primary) hypertension: Secondary | ICD-10-CM | POA: Diagnosis not present

## 2017-05-20 DIAGNOSIS — J9620 Acute and chronic respiratory failure, unspecified whether with hypoxia or hypercapnia: Secondary | ICD-10-CM | POA: Diagnosis not present

## 2017-05-22 DIAGNOSIS — M79675 Pain in left toe(s): Secondary | ICD-10-CM | POA: Diagnosis not present

## 2017-05-22 DIAGNOSIS — M79674 Pain in right toe(s): Secondary | ICD-10-CM | POA: Diagnosis not present

## 2017-05-22 DIAGNOSIS — M2032 Hallux varus (acquired), left foot: Secondary | ICD-10-CM | POA: Diagnosis not present

## 2017-05-22 DIAGNOSIS — M2042 Other hammer toe(s) (acquired), left foot: Secondary | ICD-10-CM | POA: Diagnosis not present

## 2017-05-22 DIAGNOSIS — B351 Tinea unguium: Secondary | ICD-10-CM | POA: Diagnosis not present

## 2017-05-24 ENCOUNTER — Ambulatory Visit: Payer: Medicare HMO | Admitting: Internal Medicine

## 2017-05-24 ENCOUNTER — Encounter: Payer: Self-pay | Admitting: Internal Medicine

## 2017-05-24 VITALS — BP 126/80 | HR 84 | Ht 66.0 in | Wt 216.0 lb

## 2017-05-24 DIAGNOSIS — J449 Chronic obstructive pulmonary disease, unspecified: Secondary | ICD-10-CM

## 2017-05-24 DIAGNOSIS — J9611 Chronic respiratory failure with hypoxia: Secondary | ICD-10-CM | POA: Diagnosis not present

## 2017-05-24 NOTE — Patient Instructions (Addendum)
Continue biPAP as prescribed Oxygen as prescribed Inhaler therapy as prescribed

## 2017-05-24 NOTE — Progress Notes (Signed)
[facility] Consultation     Date: 05/24/2017,   MRN# [id number] Hannah Powell [facility] [birth year]     AdmissionWeight: 216 lb (98 kg)                 CurrentWeight: 216 lb (98 kg)  CHIEF COMPLAINT:   Follow up COPD Follow up chronic resp failure   HISTORY OF PRESENT ILLNESS   Patient with end stage lung disease Now on chronic biPAP Patient is remote smoker, quit 1977 ONO results positive for hypoxia  PFT 09/13/15 Ratio 72% Fev1 52% Fef25/75 42%  Assessment-mod/severe obstructive airways disease End stage hypoxic and hypercapnic resp failure  Patient benefiting and using her BiPAP at night and tolerating well Patient also benefiting in using her oxygen at night and tolerating well  Patient plans on long road trip to [place] to visit family I have cautioned her to take her oxygen and her BiPAP machine as well as avoid sick contacts   Patient has no signs of infection at this time No signs of decompensated heart failure or COPD at this time      Current Outpatient Medications:  .  ALPRAZolam (XANAX) 0.25 MG tablet, Take 1 tablet (0.25 mg total) by mouth 3 (three) times daily as needed for anxiety., Disp: 30 tablet, Rfl: 0 .  aspirin EC 81 MG tablet, Take 81 mg by mouth daily., Disp: , Rfl:  .  azelastine (ASTELIN) 0.1 % nasal spray, Place 1 spray into both nostrils 2 (two) times daily. Use in each nostril as directed, Disp: , Rfl:  .  azithromycin (ZITHROMAX) 250 MG tablet, Take 1 tablet (250 mg total) by mouth daily. (Patient taking differently: Take 250 mg by mouth daily. Continuous for URI), Disp: 90 tablet, Rfl: 3 .  B Complex-C (B-COMPLEX WITH VITAMIN C) tablet, Take 2 tablets by mouth daily. , Disp: , Rfl:  .  Calcium Carbonate-Vitamin D (CALCIUM-VITAMIN D) 500-200 MG-UNIT per tablet, Take 1 tablet by mouth daily., Disp: , Rfl:  .  cyclobenzaprine (FLEXERIL) 10 MG tablet, Take 10 mg by mouth daily as needed for muscle spasms. , Disp: , Rfl:  .   ferrous sulfate 325 (65 FE) MG tablet, Take 325 mg by mouth daily with breakfast., Disp: , Rfl:  .  fluticasone furoate-vilanterol (BREO ELLIPTA) 200-25 MCG/INH AEPB, Inhale 1 puff into the lungs daily., Disp: 60 each, Rfl: 0 .  furosemide (LASIX) 20 MG tablet, Take 2 tablets (40 mg total) by mouth 2 (two) times daily. (Patient taking differently: Take 20-40 mg by mouth 2 (two) times daily. 2 tablets in the morning and 1 tablet at lunch), Disp: , Rfl:  .  gabapentin (NEURONTIN) 300 MG capsule, Take 300 mg by mouth at bedtime. , Disp: , Rfl:  .  guaiFENesin (MUCINEX) 600 MG 12 hr tablet, Take 1,200 mg by mouth daily as needed for cough or to loosen phlegm. , Disp: , Rfl:  .  ibuprofen (ADVIL,MOTRIN) 200 MG tablet, Take 800 mg by mouth every 8 (eight) hours as needed for headache or mild pain., Disp: , Rfl:  .  ipratropium-albuterol (DUONEB) 0.5-2.5 (3) MG/3ML SOLN, Take 3 mLs by nebulization every 4 (four) hours as needed., Disp: , Rfl:  .  levothyroxine (SYNTHROID, LEVOTHROID) 200 MCG tablet, Take 200 mcg by mouth daily before breakfast., Disp: , Rfl:  .  loratadine-pseudoephedrine (CLARITIN-D 24-HOUR) 10-240 MG 24 hr tablet, Take 1 tablet by mouth daily as needed for allergies., Disp: , Rfl:  .  losartan (COZAAR)  25 MG tablet, Take 25 mg by mouth daily., Disp: , Rfl:  .  metolazone (ZAROXOLYN) 5 MG tablet, Take 5 mg by mouth 2 (two) times a week., Disp: , Rfl:  .  Omega-3 Fatty Acids (OMEGA 3 PO), Take 1 capsule by mouth daily., Disp: , Rfl:  .  oxybutynin (DITROPAN) 5 MG tablet, Take 5 mg by mouth 3 (three) times daily., Disp: , Rfl:  .  potassium chloride SA (K-DUR,KLOR-CON) 20 MEQ tablet, Take 20 mEq by mouth 2 (two) times daily. , Disp: , Rfl:  .  pramipexole (MIRAPEX) 1 MG tablet, Take 4 mg by mouth at bedtime. , Disp: , Rfl:  .  spironolactone (ALDACTONE) 25 MG tablet, Take 25 mg by mouth daily., Disp: , Rfl:  .  traMADol (ULTRAM) 50 MG tablet, Take 1 tablet (50 mg total) by mouth every 8  (eight) hours as needed. (Patient taking differently: Take 50 mg by mouth every 8 (eight) hours as needed for moderate pain or severe pain. ), Disp: 30 tablet, Rfl: 0 .  zolpidem (AMBIEN) 10 MG tablet, Take 5 mg by mouth at bedtime as needed for sleep., Disp: , Rfl:      ALLERGIES   Lyrica [pregabalin]     REVIEW OF SYSTEMS   Review of Systems  Constitutional: Negative for chills, diaphoresis, fever, malaise/fatigue and weight loss.  HENT: Negative for congestion.   Eyes: Negative for blurred vision and double vision.  Respiratory: Positive for shortness of breath. Negative for cough, hemoptysis, sputum production and wheezing.   Cardiovascular: Positive for leg swelling. Negative for chest pain, palpitations and orthopnea.  Gastrointestinal: Negative for abdominal pain, heartburn, nausea and vomiting.  Neurological: Negative for weakness.  All other systems reviewed and are negative.    VS: BP 126/80 (BP Location: Left Arm, Cuff Size: Large)   Pulse 84   Ht 5\' 6"  (1.676 m)   Wt 216 lb (98 kg)   SpO2 97%   BMI 34.86 kg/m      PHYSICAL EXAM   Physical Exam  Constitutional: She is oriented to person, place, and time. She appears well-developed and well-nourished. No distress.  HENT:  Mouth/Throat: No oropharyngeal exudate.  Cardiovascular: Normal rate, regular rhythm and normal heart sounds.  No murmur heard. Pulmonary/Chest: Effort normal. No stridor. No respiratory distress. She has no wheezes. She has rales.  Musculoskeletal: Normal range of motion. She exhibits edema.  Neurological: She is alert and oriented to person, place, and time. No cranial nerve deficit.  Skin: Skin is warm. She is not diaphoretic.  Psychiatric: She has a normal mood and affect.        ASSESSMENT/PLAN   72 yo white female with Moderate/Severe COPD Grade D with obesity and underlying OSA with nocturnal hypoxia with chronic resp failure with recurrent bouts of COPD exacerbation and  multiple prednisone therapies.  Patient with severe end stage lung disease now on chronic biPAP therapy and 3L of oxygen therapy   1.Resp failure-hypoxia and hypercapnic -continue BiPAP for chronic hypoxic and hypercapnic resp failure -oxygen needed 24/7  2.COPD-moderate/severe Gold Stage D -continue inhaled steroids/LABA(BREO) - daily azithromycin therapy for COPD exacerbation prevention -dounebs every 4 hrs as needed  3.Morbid Obesity Unable to lose weight   4.OSA/COPD-needs biPAP for Survival  Patient is using and benefiting from her daily BiPAP and her daily oxygen support and therapy Continue as prescribed for survival  Follow up in 6 months   Patient/Family are satisfied with Plan of action and management. All  questions answered   Corrin Parker, M.D.  [facility]  Medical Director [place] Director [facility] Cardio-Pulmonary Department

## 2017-05-28 ENCOUNTER — Ambulatory Visit: Payer: Medicare HMO

## 2017-05-30 ENCOUNTER — Encounter: Payer: Self-pay | Admitting: Ophthalmology

## 2017-06-02 DIAGNOSIS — R0902 Hypoxemia: Secondary | ICD-10-CM | POA: Diagnosis not present

## 2017-06-02 DIAGNOSIS — I509 Heart failure, unspecified: Secondary | ICD-10-CM | POA: Diagnosis not present

## 2017-06-02 DIAGNOSIS — J449 Chronic obstructive pulmonary disease, unspecified: Secondary | ICD-10-CM | POA: Diagnosis not present

## 2017-06-02 DIAGNOSIS — R0689 Other abnormalities of breathing: Secondary | ICD-10-CM | POA: Diagnosis not present

## 2017-06-04 DIAGNOSIS — E7800 Pure hypercholesterolemia, unspecified: Secondary | ICD-10-CM | POA: Diagnosis not present

## 2017-06-04 DIAGNOSIS — G63 Polyneuropathy in diseases classified elsewhere: Secondary | ICD-10-CM | POA: Diagnosis not present

## 2017-06-04 DIAGNOSIS — J438 Other emphysema: Secondary | ICD-10-CM | POA: Diagnosis not present

## 2017-06-04 DIAGNOSIS — J9611 Chronic respiratory failure with hypoxia: Secondary | ICD-10-CM | POA: Diagnosis not present

## 2017-06-04 DIAGNOSIS — E034 Atrophy of thyroid (acquired): Secondary | ICD-10-CM | POA: Diagnosis not present

## 2017-06-04 DIAGNOSIS — I10 Essential (primary) hypertension: Secondary | ICD-10-CM | POA: Diagnosis not present

## 2017-06-04 DIAGNOSIS — G4733 Obstructive sleep apnea (adult) (pediatric): Secondary | ICD-10-CM | POA: Diagnosis not present

## 2017-06-04 DIAGNOSIS — I5032 Chronic diastolic (congestive) heart failure: Secondary | ICD-10-CM | POA: Diagnosis not present

## 2017-06-16 DIAGNOSIS — J449 Chronic obstructive pulmonary disease, unspecified: Secondary | ICD-10-CM | POA: Diagnosis not present

## 2017-06-20 DIAGNOSIS — J449 Chronic obstructive pulmonary disease, unspecified: Secondary | ICD-10-CM | POA: Diagnosis not present

## 2017-06-20 DIAGNOSIS — J9601 Acute respiratory failure with hypoxia: Secondary | ICD-10-CM | POA: Diagnosis not present

## 2017-06-20 DIAGNOSIS — J441 Chronic obstructive pulmonary disease with (acute) exacerbation: Secondary | ICD-10-CM | POA: Diagnosis not present

## 2017-06-20 DIAGNOSIS — I5032 Chronic diastolic (congestive) heart failure: Secondary | ICD-10-CM | POA: Diagnosis not present

## 2017-06-20 DIAGNOSIS — J9620 Acute and chronic respiratory failure, unspecified whether with hypoxia or hypercapnia: Secondary | ICD-10-CM | POA: Diagnosis not present

## 2017-07-02 DIAGNOSIS — E034 Atrophy of thyroid (acquired): Secondary | ICD-10-CM | POA: Diagnosis not present

## 2017-07-02 DIAGNOSIS — I10 Essential (primary) hypertension: Secondary | ICD-10-CM | POA: Diagnosis not present

## 2017-07-02 DIAGNOSIS — G4733 Obstructive sleep apnea (adult) (pediatric): Secondary | ICD-10-CM | POA: Diagnosis not present

## 2017-07-02 DIAGNOSIS — E7800 Pure hypercholesterolemia, unspecified: Secondary | ICD-10-CM | POA: Diagnosis not present

## 2017-07-02 DIAGNOSIS — J9611 Chronic respiratory failure with hypoxia: Secondary | ICD-10-CM | POA: Diagnosis not present

## 2017-07-02 DIAGNOSIS — I5032 Chronic diastolic (congestive) heart failure: Secondary | ICD-10-CM | POA: Diagnosis not present

## 2017-07-02 DIAGNOSIS — J438 Other emphysema: Secondary | ICD-10-CM | POA: Diagnosis not present

## 2017-07-02 DIAGNOSIS — G63 Polyneuropathy in diseases classified elsewhere: Secondary | ICD-10-CM | POA: Diagnosis not present

## 2017-07-02 DIAGNOSIS — M1710 Unilateral primary osteoarthritis, unspecified knee: Secondary | ICD-10-CM | POA: Diagnosis not present

## 2017-07-03 DIAGNOSIS — I509 Heart failure, unspecified: Secondary | ICD-10-CM | POA: Diagnosis not present

## 2017-07-03 DIAGNOSIS — R0902 Hypoxemia: Secondary | ICD-10-CM | POA: Diagnosis not present

## 2017-07-03 DIAGNOSIS — J449 Chronic obstructive pulmonary disease, unspecified: Secondary | ICD-10-CM | POA: Diagnosis not present

## 2017-07-03 DIAGNOSIS — R0689 Other abnormalities of breathing: Secondary | ICD-10-CM | POA: Diagnosis not present

## 2017-07-16 DIAGNOSIS — J449 Chronic obstructive pulmonary disease, unspecified: Secondary | ICD-10-CM | POA: Diagnosis not present

## 2017-07-17 DIAGNOSIS — I5032 Chronic diastolic (congestive) heart failure: Secondary | ICD-10-CM | POA: Diagnosis not present

## 2017-07-19 DIAGNOSIS — J438 Other emphysema: Secondary | ICD-10-CM | POA: Diagnosis not present

## 2017-07-19 DIAGNOSIS — I10 Essential (primary) hypertension: Secondary | ICD-10-CM | POA: Diagnosis not present

## 2017-07-19 DIAGNOSIS — E7800 Pure hypercholesterolemia, unspecified: Secondary | ICD-10-CM | POA: Diagnosis not present

## 2017-07-19 DIAGNOSIS — E034 Atrophy of thyroid (acquired): Secondary | ICD-10-CM | POA: Diagnosis not present

## 2017-07-19 DIAGNOSIS — J9611 Chronic respiratory failure with hypoxia: Secondary | ICD-10-CM | POA: Diagnosis not present

## 2017-07-19 DIAGNOSIS — Z79899 Other long term (current) drug therapy: Secondary | ICD-10-CM | POA: Diagnosis not present

## 2017-07-20 DIAGNOSIS — J449 Chronic obstructive pulmonary disease, unspecified: Secondary | ICD-10-CM | POA: Diagnosis not present

## 2017-07-20 DIAGNOSIS — J9620 Acute and chronic respiratory failure, unspecified whether with hypoxia or hypercapnia: Secondary | ICD-10-CM | POA: Diagnosis not present

## 2017-07-20 DIAGNOSIS — I5032 Chronic diastolic (congestive) heart failure: Secondary | ICD-10-CM | POA: Diagnosis not present

## 2017-07-20 DIAGNOSIS — J9601 Acute respiratory failure with hypoxia: Secondary | ICD-10-CM | POA: Diagnosis not present

## 2017-07-20 DIAGNOSIS — J441 Chronic obstructive pulmonary disease with (acute) exacerbation: Secondary | ICD-10-CM | POA: Diagnosis not present

## 2017-08-02 ENCOUNTER — Other Ambulatory Visit: Payer: Self-pay | Admitting: Internal Medicine

## 2017-08-02 ENCOUNTER — Ambulatory Visit
Admission: RE | Admit: 2017-08-02 | Discharge: 2017-08-02 | Disposition: A | Discharge status: Home / Self Care | Payer: Medicare HMO | Source: Ambulatory Visit | Attending: Internal Medicine | Admitting: Internal Medicine

## 2017-08-02 DIAGNOSIS — G4733 Obstructive sleep apnea (adult) (pediatric): Secondary | ICD-10-CM | POA: Diagnosis not present

## 2017-08-02 DIAGNOSIS — I5032 Chronic diastolic (congestive) heart failure: Secondary | ICD-10-CM | POA: Insufficient documentation

## 2017-08-02 DIAGNOSIS — M79604 Pain in right leg: Secondary | ICD-10-CM | POA: Insufficient documentation

## 2017-08-02 DIAGNOSIS — R0689 Other abnormalities of breathing: Secondary | ICD-10-CM | POA: Diagnosis not present

## 2017-08-02 DIAGNOSIS — R600 Localized edema: Secondary | ICD-10-CM

## 2017-08-02 DIAGNOSIS — J9611 Chronic respiratory failure with hypoxia: Secondary | ICD-10-CM | POA: Diagnosis not present

## 2017-08-02 DIAGNOSIS — I10 Essential (primary) hypertension: Secondary | ICD-10-CM | POA: Diagnosis not present

## 2017-08-02 DIAGNOSIS — J438 Other emphysema: Secondary | ICD-10-CM | POA: Diagnosis not present

## 2017-08-02 DIAGNOSIS — E7800 Pure hypercholesterolemia, unspecified: Secondary | ICD-10-CM | POA: Diagnosis not present

## 2017-08-02 DIAGNOSIS — R0902 Hypoxemia: Secondary | ICD-10-CM | POA: Diagnosis not present

## 2017-08-02 DIAGNOSIS — J449 Chronic obstructive pulmonary disease, unspecified: Secondary | ICD-10-CM | POA: Diagnosis not present

## 2017-08-02 DIAGNOSIS — E034 Atrophy of thyroid (acquired): Secondary | ICD-10-CM | POA: Diagnosis not present

## 2017-08-02 DIAGNOSIS — I509 Heart failure, unspecified: Secondary | ICD-10-CM | POA: Diagnosis not present

## 2017-08-02 DIAGNOSIS — G63 Polyneuropathy in diseases classified elsewhere: Secondary | ICD-10-CM | POA: Diagnosis not present

## 2017-08-02 DIAGNOSIS — M1710 Unilateral primary osteoarthritis, unspecified knee: Secondary | ICD-10-CM | POA: Diagnosis not present

## 2017-08-08 DIAGNOSIS — I5032 Chronic diastolic (congestive) heart failure: Secondary | ICD-10-CM | POA: Diagnosis not present

## 2017-08-08 DIAGNOSIS — L989 Disorder of the skin and subcutaneous tissue, unspecified: Secondary | ICD-10-CM | POA: Diagnosis not present

## 2017-08-09 DIAGNOSIS — R350 Frequency of micturition: Secondary | ICD-10-CM | POA: Diagnosis not present

## 2017-08-09 DIAGNOSIS — R109 Unspecified abdominal pain: Secondary | ICD-10-CM | POA: Diagnosis not present

## 2017-08-16 DIAGNOSIS — J449 Chronic obstructive pulmonary disease, unspecified: Secondary | ICD-10-CM | POA: Diagnosis not present

## 2017-08-20 DIAGNOSIS — J9601 Acute respiratory failure with hypoxia: Secondary | ICD-10-CM | POA: Diagnosis not present

## 2017-08-20 DIAGNOSIS — J9620 Acute and chronic respiratory failure, unspecified whether with hypoxia or hypercapnia: Secondary | ICD-10-CM | POA: Diagnosis not present

## 2017-08-20 DIAGNOSIS — I5032 Chronic diastolic (congestive) heart failure: Secondary | ICD-10-CM | POA: Diagnosis not present

## 2017-08-20 DIAGNOSIS — J441 Chronic obstructive pulmonary disease with (acute) exacerbation: Secondary | ICD-10-CM | POA: Diagnosis not present

## 2017-08-20 DIAGNOSIS — J449 Chronic obstructive pulmonary disease, unspecified: Secondary | ICD-10-CM | POA: Diagnosis not present

## 2017-09-02 DIAGNOSIS — R0902 Hypoxemia: Secondary | ICD-10-CM | POA: Diagnosis not present

## 2017-09-02 DIAGNOSIS — I509 Heart failure, unspecified: Secondary | ICD-10-CM | POA: Diagnosis not present

## 2017-09-02 DIAGNOSIS — R0689 Other abnormalities of breathing: Secondary | ICD-10-CM | POA: Diagnosis not present

## 2017-09-02 DIAGNOSIS — J449 Chronic obstructive pulmonary disease, unspecified: Secondary | ICD-10-CM | POA: Diagnosis not present

## 2017-09-15 DIAGNOSIS — J449 Chronic obstructive pulmonary disease, unspecified: Secondary | ICD-10-CM | POA: Diagnosis not present

## 2017-10-02 DIAGNOSIS — R0689 Other abnormalities of breathing: Secondary | ICD-10-CM | POA: Diagnosis not present

## 2017-10-02 DIAGNOSIS — I509 Heart failure, unspecified: Secondary | ICD-10-CM | POA: Diagnosis not present

## 2017-10-02 DIAGNOSIS — R0902 Hypoxemia: Secondary | ICD-10-CM | POA: Diagnosis not present

## 2017-10-02 DIAGNOSIS — J449 Chronic obstructive pulmonary disease, unspecified: Secondary | ICD-10-CM | POA: Diagnosis not present

## 2017-10-16 DIAGNOSIS — J449 Chronic obstructive pulmonary disease, unspecified: Secondary | ICD-10-CM | POA: Diagnosis not present

## 2017-10-17 ENCOUNTER — Other Ambulatory Visit: Payer: Self-pay | Admitting: Internal Medicine

## 2017-11-02 DIAGNOSIS — J449 Chronic obstructive pulmonary disease, unspecified: Secondary | ICD-10-CM | POA: Diagnosis not present

## 2017-11-02 DIAGNOSIS — R0689 Other abnormalities of breathing: Secondary | ICD-10-CM | POA: Diagnosis not present

## 2017-11-02 DIAGNOSIS — I509 Heart failure, unspecified: Secondary | ICD-10-CM | POA: Diagnosis not present

## 2017-11-02 DIAGNOSIS — R0902 Hypoxemia: Secondary | ICD-10-CM | POA: Diagnosis not present

## 2017-11-06 DIAGNOSIS — I5032 Chronic diastolic (congestive) heart failure: Secondary | ICD-10-CM | POA: Diagnosis not present

## 2017-11-06 DIAGNOSIS — L03115 Cellulitis of right lower limb: Secondary | ICD-10-CM | POA: Diagnosis not present

## 2017-11-07 DIAGNOSIS — M48062 Spinal stenosis, lumbar region with neurogenic claudication: Secondary | ICD-10-CM | POA: Diagnosis not present

## 2017-11-07 DIAGNOSIS — E034 Atrophy of thyroid (acquired): Secondary | ICD-10-CM | POA: Diagnosis not present

## 2017-11-07 DIAGNOSIS — I10 Essential (primary) hypertension: Secondary | ICD-10-CM | POA: Diagnosis not present

## 2017-11-07 DIAGNOSIS — M1710 Unilateral primary osteoarthritis, unspecified knee: Secondary | ICD-10-CM | POA: Diagnosis not present

## 2017-11-13 DIAGNOSIS — G4733 Obstructive sleep apnea (adult) (pediatric): Secondary | ICD-10-CM | POA: Diagnosis not present

## 2017-11-13 DIAGNOSIS — E7800 Pure hypercholesterolemia, unspecified: Secondary | ICD-10-CM | POA: Diagnosis not present

## 2017-11-13 DIAGNOSIS — G63 Polyneuropathy in diseases classified elsewhere: Secondary | ICD-10-CM | POA: Diagnosis not present

## 2017-11-13 DIAGNOSIS — J438 Other emphysema: Secondary | ICD-10-CM | POA: Diagnosis not present

## 2017-11-13 DIAGNOSIS — I10 Essential (primary) hypertension: Secondary | ICD-10-CM | POA: Diagnosis not present

## 2017-11-13 DIAGNOSIS — J9611 Chronic respiratory failure with hypoxia: Secondary | ICD-10-CM | POA: Diagnosis not present

## 2017-11-13 DIAGNOSIS — I5032 Chronic diastolic (congestive) heart failure: Secondary | ICD-10-CM | POA: Diagnosis not present

## 2017-11-13 DIAGNOSIS — E034 Atrophy of thyroid (acquired): Secondary | ICD-10-CM | POA: Diagnosis not present

## 2017-11-16 DIAGNOSIS — J449 Chronic obstructive pulmonary disease, unspecified: Secondary | ICD-10-CM | POA: Diagnosis not present

## 2017-11-19 DIAGNOSIS — M25511 Pain in right shoulder: Secondary | ICD-10-CM | POA: Diagnosis not present

## 2017-11-19 DIAGNOSIS — M25512 Pain in left shoulder: Secondary | ICD-10-CM | POA: Diagnosis not present

## 2017-11-21 ENCOUNTER — Ambulatory Visit: Payer: Medicare HMO | Admitting: Internal Medicine

## 2017-11-21 ENCOUNTER — Encounter: Payer: Self-pay | Admitting: Internal Medicine

## 2017-11-21 VITALS — BP 118/70 | HR 83 | Resp 16 | Ht 66.0 in | Wt 218.0 lb

## 2017-11-21 DIAGNOSIS — J449 Chronic obstructive pulmonary disease, unspecified: Secondary | ICD-10-CM

## 2017-11-21 DIAGNOSIS — J9612 Chronic respiratory failure with hypercapnia: Secondary | ICD-10-CM | POA: Diagnosis not present

## 2017-11-21 DIAGNOSIS — J9611 Chronic respiratory failure with hypoxia: Secondary | ICD-10-CM

## 2017-11-21 NOTE — Progress Notes (Signed)
[place] [facility] Consultation     Date: 11/21/2017,   MRN# [id number] Asenath Balash [facility] 02-Nov-1945     Admission                  Current   CHIEF COMPLAINT:   Follow up COPD Follow up chronic resp failure   HISTORY OF PRESENT ILLNESS  Patient with end stage lung disease Now on chronic biPAP Patient is remote smoker, quit 1977 ONO results positive for hypoxia  PFT 09/13/15 Ratio 72% Fev1 52% Fef25/75 42%  Assessment-mod/severe obstructive airways disease End stage hypoxic and hypercapnic resp failure  Patient benefiting and using her BiPAP at night and tolerating well Patient also benefiting in using her oxygen at night and tolerating well  Patient plans on long road trip to [place] to visit family I have cautioned her to take her oxygen and her BiPAP machine as well as avoid sick contacts   Patient has no signs of infection at this time No signs of decompensated heart failure or COPD at this time       Current Outpatient Medications:  .  ALPRAZolam (XANAX) 0.25 MG tablet, Take 1 tablet (0.25 mg total) by mouth 3 (three) times daily as needed for anxiety., Disp: 30 tablet, Rfl: 0 .  aspirin EC 81 MG tablet, Take 81 mg by mouth daily., Disp: , Rfl:  .  azelastine (ASTELIN) 0.1 % nasal spray, Place 1 spray into both nostrils 2 (two) times daily. Use in each nostril as directed, Disp: , Rfl:  .  azithromycin (ZITHROMAX) 250 MG tablet, TAKE 1 TABLET (250 MG TOTAL) BY MOUTH DAILY., Disp: 90 tablet, Rfl: 3 .  B Complex-C (B-COMPLEX WITH VITAMIN C) tablet, Take 2 tablets by mouth daily. , Disp: , Rfl:  .  Calcium Carbonate-Vitamin D (CALCIUM-VITAMIN D) 500-200 MG-UNIT per tablet, Take 1 tablet by mouth daily., Disp: , Rfl:  .  cyclobenzaprine (FLEXERIL) 10 MG tablet, Take 10 mg by mouth daily as needed for muscle spasms. , Disp: , Rfl:  .  ferrous sulfate 325 (65 FE) MG tablet, Take 325 mg by mouth daily with breakfast., Disp: , Rfl:  .  fluticasone  furoate-vilanterol (BREO ELLIPTA) 200-25 MCG/INH AEPB, Inhale 1 puff into the lungs daily., Disp: 60 each, Rfl: 0 .  furosemide (LASIX) 20 MG tablet, Take 2 tablets (40 mg total) by mouth 2 (two) times daily. (Patient taking differently: Take 20-40 mg by mouth 2 (two) times daily. 2 tablets in the morning and 1 tablet at lunch), Disp: , Rfl:  .  gabapentin (NEURONTIN) 300 MG capsule, Take 300 mg by mouth at bedtime. , Disp: , Rfl:  .  guaiFENesin (MUCINEX) 600 MG 12 hr tablet, Take 1,200 mg by mouth daily as needed for cough or to loosen phlegm. , Disp: , Rfl:  .  ibuprofen (ADVIL,MOTRIN) 200 MG tablet, Take 800 mg by mouth every 8 (eight) hours as needed for headache or mild pain., Disp: , Rfl:  .  ipratropium-albuterol (DUONEB) 0.5-2.5 (3) MG/3ML SOLN, Take 3 mLs by nebulization every 4 (four) hours as needed., Disp: , Rfl:  .  levothyroxine (SYNTHROID, LEVOTHROID) 200 MCG tablet, Take 200 mcg by mouth daily before breakfast., Disp: , Rfl:  .  loratadine-pseudoephedrine (CLARITIN-D 24-HOUR) 10-240 MG 24 hr tablet, Take 1 tablet by mouth daily as needed for allergies., Disp: , Rfl:  .  losartan (COZAAR) 25 MG tablet, Take 25 mg by mouth daily., Disp: , Rfl:  .  metolazone (ZAROXOLYN) 5  MG tablet, Take 5 mg by mouth 2 (two) times a week., Disp: , Rfl:  .  Omega-3 Fatty Acids (OMEGA 3 PO), Take 1 capsule by mouth daily., Disp: , Rfl:  .  oxybutynin (DITROPAN) 5 MG tablet, Take 5 mg by mouth 3 (three) times daily., Disp: , Rfl:  .  potassium chloride SA (K-DUR,KLOR-CON) 20 MEQ tablet, Take 20 mEq by mouth 2 (two) times daily. , Disp: , Rfl:  .  pramipexole (MIRAPEX) 1 MG tablet, Take 4 mg by mouth at bedtime. , Disp: , Rfl:  .  spironolactone (ALDACTONE) 25 MG tablet, Take 25 mg by mouth daily., Disp: , Rfl:  .  traMADol (ULTRAM) 50 MG tablet, Take 1 tablet (50 mg total) by mouth every 8 (eight) hours as needed. (Patient taking differently: Take 50 mg by mouth every 8 (eight) hours as needed for  moderate pain or severe pain. ), Disp: 30 tablet, Rfl: 0 .  zolpidem (AMBIEN) 10 MG tablet, Take 5 mg by mouth at bedtime as needed for sleep., Disp: , Rfl:      ALLERGIES   Lyrica [pregabalin]     REVIEW OF SYSTEMS   Review of Systems  Constitutional: Negative for chills, diaphoresis, fever, malaise/fatigue and weight loss.  HENT: Negative for congestion.   Eyes: Negative for blurred vision and double vision.  Respiratory: Negative for cough, hemoptysis, sputum production, shortness of breath and wheezing.   Cardiovascular: Negative for chest pain, palpitations, orthopnea and leg swelling.  Gastrointestinal: Negative for abdominal pain, heartburn, nausea and vomiting.  Neurological: Negative for weakness.  All other systems reviewed and are negative.    VS: Ht 5\' 6"  (1.676 m)   BMI 34.86 kg/m    BP 118/70 (BP Location: Left Arm, Cuff Size: Large)   Pulse 83   Resp 16   Ht 5\' 6"  (1.676 m)   Wt 218 lb (98.9 kg)   SpO2 98%   BMI 35.19 kg/m    PHYSICAL EXAM   Physical Exam  Constitutional: She is oriented to person, place, and time. She appears well-developed and well-nourished. No distress.  HENT:  Mouth/Throat: No oropharyngeal exudate.  Cardiovascular: Normal rate, regular rhythm and normal heart sounds.  No murmur heard. Pulmonary/Chest: Effort normal. No stridor. No respiratory distress. She has no wheezes. She has no rales.  Musculoskeletal: Normal range of motion. She exhibits no edema.  Neurological: She is alert and oriented to person, place, and time. No cranial nerve deficit.  Skin: Skin is warm. She is not diaphoretic.  Psychiatric: She has a normal mood and affect.        ASSESSMENT/PLAN   72 yo white female with Moderate/Severe COPD Grade D with obesity and underlying OSA with nocturnal hypoxia with chronic resp failure with recurrent bouts of COPD exacerbation and multiple prednisone therapies.  Patient with severe end stage lung disease now on  chronic biPAP therapy and 3L of oxygen therapy   1.Resp failure-hypoxia and hypercapnic -continue BiPAP for chronic hypoxic and hypercapnic resp failure -oxygen needed 24/7  2.COPD-moderate/severe Gold Stage D -continue inhaled steroids/LABA(BREO) -dounebs every 4 hrs as needed  3.Morbid Obesity Unable to lose weight   4.OSA/COPD-needs biPAP for Survival  Patient is using and benefiting from her daily BiPAP and her daily oxygen support and therapy Continue as prescribed for survival  Follow up in 6 months   Patient/Family are satisfied with Plan of action and management. All questions answered   Corrin Parker, M.D.  [facility]  Medical Director [place] Director [facility] Cardio-Pulmonary Department

## 2017-11-21 NOTE — Patient Instructions (Signed)
Continue BiPAP as prescribed nighty Continue inhalers as prescribed

## 2017-11-22 DIAGNOSIS — E876 Hypokalemia: Secondary | ICD-10-CM | POA: Diagnosis not present

## 2017-11-22 DIAGNOSIS — I10 Essential (primary) hypertension: Secondary | ICD-10-CM | POA: Diagnosis not present

## 2017-11-25 DIAGNOSIS — E034 Atrophy of thyroid (acquired): Secondary | ICD-10-CM | POA: Diagnosis not present

## 2017-11-25 DIAGNOSIS — J438 Other emphysema: Secondary | ICD-10-CM | POA: Diagnosis not present

## 2017-11-25 DIAGNOSIS — G63 Polyneuropathy in diseases classified elsewhere: Secondary | ICD-10-CM | POA: Diagnosis not present

## 2017-11-25 DIAGNOSIS — E7800 Pure hypercholesterolemia, unspecified: Secondary | ICD-10-CM | POA: Diagnosis not present

## 2017-11-25 DIAGNOSIS — J9611 Chronic respiratory failure with hypoxia: Secondary | ICD-10-CM | POA: Diagnosis not present

## 2017-11-25 DIAGNOSIS — M48062 Spinal stenosis, lumbar region with neurogenic claudication: Secondary | ICD-10-CM | POA: Diagnosis not present

## 2017-11-25 DIAGNOSIS — I5032 Chronic diastolic (congestive) heart failure: Secondary | ICD-10-CM | POA: Diagnosis not present

## 2017-11-25 DIAGNOSIS — I10 Essential (primary) hypertension: Secondary | ICD-10-CM | POA: Diagnosis not present

## 2017-11-25 DIAGNOSIS — G4733 Obstructive sleep apnea (adult) (pediatric): Secondary | ICD-10-CM | POA: Diagnosis not present

## 2017-11-28 DIAGNOSIS — M25511 Pain in right shoulder: Secondary | ICD-10-CM | POA: Diagnosis not present

## 2017-12-03 DIAGNOSIS — R0902 Hypoxemia: Secondary | ICD-10-CM | POA: Diagnosis not present

## 2017-12-03 DIAGNOSIS — R0689 Other abnormalities of breathing: Secondary | ICD-10-CM | POA: Diagnosis not present

## 2017-12-03 DIAGNOSIS — J449 Chronic obstructive pulmonary disease, unspecified: Secondary | ICD-10-CM | POA: Diagnosis not present

## 2017-12-03 DIAGNOSIS — I509 Heart failure, unspecified: Secondary | ICD-10-CM | POA: Diagnosis not present

## 2017-12-16 DIAGNOSIS — J449 Chronic obstructive pulmonary disease, unspecified: Secondary | ICD-10-CM | POA: Diagnosis not present

## 2017-12-24 DIAGNOSIS — L02612 Cutaneous abscess of left foot: Secondary | ICD-10-CM | POA: Diagnosis not present

## 2017-12-24 DIAGNOSIS — L03032 Cellulitis of left toe: Secondary | ICD-10-CM | POA: Diagnosis not present

## 2017-12-24 DIAGNOSIS — L03116 Cellulitis of left lower limb: Secondary | ICD-10-CM | POA: Diagnosis not present

## 2017-12-27 DIAGNOSIS — L03032 Cellulitis of left toe: Secondary | ICD-10-CM | POA: Diagnosis not present

## 2017-12-27 DIAGNOSIS — L02612 Cutaneous abscess of left foot: Secondary | ICD-10-CM | POA: Diagnosis not present

## 2017-12-27 DIAGNOSIS — L03116 Cellulitis of left lower limb: Secondary | ICD-10-CM | POA: Diagnosis not present

## 2018-01-02 DIAGNOSIS — R0902 Hypoxemia: Secondary | ICD-10-CM | POA: Diagnosis not present

## 2018-01-02 DIAGNOSIS — J449 Chronic obstructive pulmonary disease, unspecified: Secondary | ICD-10-CM | POA: Diagnosis not present

## 2018-01-02 DIAGNOSIS — R0689 Other abnormalities of breathing: Secondary | ICD-10-CM | POA: Diagnosis not present

## 2018-01-02 DIAGNOSIS — I509 Heart failure, unspecified: Secondary | ICD-10-CM | POA: Diagnosis not present

## 2018-01-08 DIAGNOSIS — M25511 Pain in right shoulder: Secondary | ICD-10-CM | POA: Diagnosis not present

## 2018-01-16 ENCOUNTER — Ambulatory Visit: Payer: Medicare HMO | Admitting: Internal Medicine

## 2018-01-16 ENCOUNTER — Encounter: Payer: Self-pay | Admitting: Internal Medicine

## 2018-01-16 VITALS — BP 122/90 | HR 85 | Ht 67.0 in | Wt 220.0 lb

## 2018-01-16 DIAGNOSIS — J9612 Chronic respiratory failure with hypercapnia: Secondary | ICD-10-CM | POA: Diagnosis not present

## 2018-01-16 DIAGNOSIS — G4733 Obstructive sleep apnea (adult) (pediatric): Secondary | ICD-10-CM

## 2018-01-16 DIAGNOSIS — J9611 Chronic respiratory failure with hypoxia: Secondary | ICD-10-CM

## 2018-01-16 DIAGNOSIS — J449 Chronic obstructive pulmonary disease, unspecified: Secondary | ICD-10-CM

## 2018-01-16 DIAGNOSIS — R29898 Other symptoms and signs involving the musculoskeletal system: Secondary | ICD-10-CM | POA: Diagnosis not present

## 2018-01-16 NOTE — Patient Instructions (Addendum)
PATIENT IS MODERATE RISK FOR PULMONARY/CARDIAC COMPLICATIONS FOR ANY TYPE OF SURGERY    CONTINUE OXYGEN AND BIPAP AS PRESCRIBED  CONTINUE INHALERS AS PRESCRIBED

## 2018-01-16 NOTE — Progress Notes (Signed)
[place] [facility] Consultation     Date: 01/16/2018,   MRN# [id number] Hannah Powell [facility] 1945/05/13     Admission                  Current   CHIEF COMPLAINT:   Follow up COPD Follow up chronic resp failure   HISTORY OF PRESENT ILLNESS   Patient with end-stage lung disease requiring oxygen and BiPAP therapy for survival Currently on chronic BiPAP Remote smoker quit 1977 Has hypoxic respiratory failure which is chronic  Previous PFTs in 2017 showed FEV1 of 52% predicted  She has moderate severe obstructive airways disease with hypoxic and hypercapnic respiratory failure Patient benefiting and using her BiPAP at night and tolerating well Patient also benefiting and using her oxygen at night and tolerating well  No signs of infection at this time No signs of decompensated heart failure or COPD at this time   Patient wants to have surgery on her shoulder for osteoarthritis I have explained to her she is moderate risk for postop pulmonary complications        Current Outpatient Medications:  .  ALPRAZolam (XANAX) 0.25 MG tablet, Take 1 tablet (0.25 mg total) by mouth 3 (three) times daily as needed for anxiety., Disp: 30 tablet, Rfl: 0 .  aspirin EC 81 MG tablet, Take 81 mg by mouth daily., Disp: , Rfl:  .  azelastine (ASTELIN) 0.1 % nasal spray, Place 1 spray into both nostrils 2 (two) times daily. Use in each nostril as directed, Disp: , Rfl:  .  azithromycin (ZITHROMAX) 250 MG tablet, TAKE 1 TABLET (250 MG TOTAL) BY MOUTH DAILY., Disp: 90 tablet, Rfl: 3 .  B Complex-C (B-COMPLEX WITH VITAMIN C) tablet, Take 2 tablets by mouth daily. , Disp: , Rfl:  .  Calcium Carbonate-Vitamin D (CALCIUM-VITAMIN D) 500-200 MG-UNIT per tablet, Take 1 tablet by mouth daily., Disp: , Rfl:  .  cyclobenzaprine (FLEXERIL) 10 MG tablet, Take 10 mg by mouth daily as needed for muscle spasms. , Disp: , Rfl:  .  ferrous sulfate 325 (65 FE) MG tablet, Take 325 mg by mouth daily with  breakfast., Disp: , Rfl:  .  fluticasone furoate-vilanterol (BREO ELLIPTA) 200-25 MCG/INH AEPB, Inhale 1 puff into the lungs daily., Disp: 60 each, Rfl: 0 .  furosemide (LASIX) 20 MG tablet, Take 2 tablets (40 mg total) by mouth 2 (two) times daily. (Patient taking differently: Take 20-40 mg by mouth 2 (two) times daily. 2 tablets in the morning and 1 tablet at lunch), Disp: , Rfl:  .  gabapentin (NEURONTIN) 300 MG capsule, Take 300 mg by mouth at bedtime. , Disp: , Rfl:  .  guaiFENesin (MUCINEX) 600 MG 12 hr tablet, Take 1,200 mg by mouth daily as needed for cough or to loosen phlegm. , Disp: , Rfl:  .  ibuprofen (ADVIL,MOTRIN) 200 MG tablet, Take 800 mg by mouth every 8 (eight) hours as needed for headache or mild pain., Disp: , Rfl:  .  ipratropium-albuterol (DUONEB) 0.5-2.5 (3) MG/3ML SOLN, Take 3 mLs by nebulization every 4 (four) hours as needed., Disp: , Rfl:  .  levothyroxine (SYNTHROID, LEVOTHROID) 200 MCG tablet, Take 200 mcg by mouth daily before breakfast., Disp: , Rfl:  .  loratadine-pseudoephedrine (CLARITIN-D 24-HOUR) 10-240 MG 24 hr tablet, Take 1 tablet by mouth daily as needed for allergies., Disp: , Rfl:  .  losartan (COZAAR) 25 MG tablet, Take 25 mg by mouth daily., Disp: , Rfl:  .  metolazone (  ZAROXOLYN) 5 MG tablet, Take 5 mg by mouth 2 (two) times a week., Disp: , Rfl:  .  Omega-3 Fatty Acids (OMEGA 3 PO), Take 1 capsule by mouth daily., Disp: , Rfl:  .  oxybutynin (DITROPAN) 5 MG tablet, Take 5 mg by mouth 3 (three) times daily., Disp: , Rfl:  .  potassium chloride SA (K-DUR,KLOR-CON) 20 MEQ tablet, Take 20 mEq by mouth 2 (two) times daily. , Disp: , Rfl:  .  pramipexole (MIRAPEX) 1 MG tablet, Take 4 mg by mouth at bedtime. , Disp: , Rfl:  .  spironolactone (ALDACTONE) 25 MG tablet, Take 25 mg by mouth daily., Disp: , Rfl:  .  traMADol (ULTRAM) 50 MG tablet, Take 1 tablet (50 mg total) by mouth every 8 (eight) hours as needed. (Patient taking differently: Take 50 mg by mouth  every 8 (eight) hours as needed for moderate pain or severe pain. ), Disp: 30 tablet, Rfl: 0 .  zolpidem (AMBIEN) 10 MG tablet, Take 5 mg by mouth at bedtime as needed for sleep., Disp: , Rfl:      ALLERGIES   Lyrica [pregabalin]     REVIEW OF SYSTEMS   Review of Systems  Constitutional: Negative for chills, diaphoresis, fever, malaise/fatigue and weight loss.  HENT: Negative for congestion.   Eyes: Negative for blurred vision and double vision.  Respiratory: Negative for cough, hemoptysis, sputum production, shortness of breath and wheezing.   Cardiovascular: Positive for leg swelling. Negative for chest pain, palpitations and orthopnea.  Gastrointestinal: Negative for abdominal pain, heartburn, nausea and vomiting.  Neurological: Negative for weakness.  All other systems reviewed and are negative.   Ht 5\' 7"  (1.702 m)   Wt 220 lb (99.8 kg)   BMI 34.46 kg/m   BP 122/90 (BP Location: Left Arm, Cuff Size: Normal)   Pulse 85   Ht 5\' 7"  (0.737 m)   Wt 220 lb (99.8 kg)   SpO2 96%   BMI 34.46 kg/m   PHYSICAL EXAM   Physical Exam  Constitutional: She is oriented to person, place, and time. She appears well-developed and well-nourished. No distress.  HENT:  Mouth/Throat: No oropharyngeal exudate.  Cardiovascular: Normal rate, regular rhythm and normal heart sounds.  No murmur heard. Pulmonary/Chest: Effort normal. No stridor. No respiratory distress. She has no wheezes. She has no rales.  Musculoskeletal: Normal range of motion. She exhibits edema.  Neurological: She is alert and oriented to person, place, and time. No cranial nerve deficit.  Skin: Skin is warm. She is not diaphoretic.  Psychiatric: She has a normal mood and affect.        ASSESSMENT/PLAN   72 year old morbidly obese white female with moderate  COPD grade stage D with underlying sleep apnea with nocturnal hypoxia with chronic respiratory failure with previous recurrent bouts of COPD exacerbation   chronic BiPAP therapy and on 3 L of oxygen therapy  #1 chronic respiratory failure hypoxic and hypercapnic From underlying COPD and sleep apnea Continue BiPAP for chronic hypoxic respiratory failure and hypercapnic respiratory failure Oxygen needed 24/7 Patient uses and benefits from BiPAP therapy which reduces her admissions to the hospital   #2 COPD moderate to severe Gold stage D Continue inhaled steroids/LABA  Duo nebs every 4 hours as needed  #3 OSA and COPD Patient uses and benefits from BiPAP therapy for survival  #4 obesity -recommend significant weight loss -recommend changing diet  #5 deconditioned state -Recommend increased daily activity and exercise  #6 patient is having shoulder pain  and wants assessment of her resp  status prior to procedure I have I have assessed the patient and she is a moderate  postop pulmonary complications    Patient/Family are satisfied with Plan of action and management. All questions answered Follow up in 6 months  Sky Primo Patricia Pesa, M.D.  [facility]  Medical Director [place] Director [facility] Cardio-Pulmonary Department

## 2018-01-17 ENCOUNTER — Telehealth: Payer: Self-pay | Admitting: Internal Medicine

## 2018-01-17 NOTE — Telephone Encounter (Signed)
Patient calling stating Dr Mortimer Fries had some concerns about her having the  Shoulder replacement. She states she wanted to know how to what can she do to lower the risk of her doing this  Would like a call back about this

## 2018-02-02 DIAGNOSIS — I509 Heart failure, unspecified: Secondary | ICD-10-CM | POA: Diagnosis not present

## 2018-02-02 DIAGNOSIS — R0902 Hypoxemia: Secondary | ICD-10-CM | POA: Diagnosis not present

## 2018-02-02 DIAGNOSIS — R0689 Other abnormalities of breathing: Secondary | ICD-10-CM | POA: Diagnosis not present

## 2018-02-02 DIAGNOSIS — J449 Chronic obstructive pulmonary disease, unspecified: Secondary | ICD-10-CM | POA: Diagnosis not present

## 2018-02-15 DIAGNOSIS — J449 Chronic obstructive pulmonary disease, unspecified: Secondary | ICD-10-CM | POA: Diagnosis not present

## 2018-02-19 DIAGNOSIS — E034 Atrophy of thyroid (acquired): Secondary | ICD-10-CM | POA: Diagnosis not present

## 2018-02-19 DIAGNOSIS — I10 Essential (primary) hypertension: Secondary | ICD-10-CM | POA: Diagnosis not present

## 2018-02-19 DIAGNOSIS — E7800 Pure hypercholesterolemia, unspecified: Secondary | ICD-10-CM | POA: Diagnosis not present

## 2018-02-19 DIAGNOSIS — J9611 Chronic respiratory failure with hypoxia: Secondary | ICD-10-CM | POA: Diagnosis not present

## 2018-02-19 DIAGNOSIS — M48062 Spinal stenosis, lumbar region with neurogenic claudication: Secondary | ICD-10-CM | POA: Diagnosis not present

## 2018-02-19 DIAGNOSIS — Z79899 Other long term (current) drug therapy: Secondary | ICD-10-CM | POA: Diagnosis not present

## 2018-02-24 DIAGNOSIS — Z79899 Other long term (current) drug therapy: Secondary | ICD-10-CM | POA: Diagnosis not present

## 2018-02-24 DIAGNOSIS — G4733 Obstructive sleep apnea (adult) (pediatric): Secondary | ICD-10-CM | POA: Diagnosis not present

## 2018-02-24 DIAGNOSIS — J9611 Chronic respiratory failure with hypoxia: Secondary | ICD-10-CM | POA: Diagnosis not present

## 2018-02-24 DIAGNOSIS — I10 Essential (primary) hypertension: Secondary | ICD-10-CM | POA: Diagnosis not present

## 2018-02-24 DIAGNOSIS — I5032 Chronic diastolic (congestive) heart failure: Secondary | ICD-10-CM | POA: Diagnosis not present

## 2018-02-24 DIAGNOSIS — J438 Other emphysema: Secondary | ICD-10-CM | POA: Diagnosis not present

## 2018-02-24 DIAGNOSIS — G63 Polyneuropathy in diseases classified elsewhere: Secondary | ICD-10-CM | POA: Diagnosis not present

## 2018-02-24 DIAGNOSIS — E034 Atrophy of thyroid (acquired): Secondary | ICD-10-CM | POA: Diagnosis not present

## 2018-02-26 ENCOUNTER — Ambulatory Visit: Payer: Medicare HMO | Admitting: Internal Medicine

## 2018-02-27 DIAGNOSIS — R29898 Other symptoms and signs involving the musculoskeletal system: Secondary | ICD-10-CM | POA: Diagnosis not present

## 2018-02-27 DIAGNOSIS — M6281 Muscle weakness (generalized): Secondary | ICD-10-CM | POA: Diagnosis not present

## 2018-02-27 DIAGNOSIS — R2689 Other abnormalities of gait and mobility: Secondary | ICD-10-CM | POA: Diagnosis not present

## 2018-02-27 DIAGNOSIS — R262 Difficulty in walking, not elsewhere classified: Secondary | ICD-10-CM | POA: Diagnosis not present

## 2018-03-03 DIAGNOSIS — R2689 Other abnormalities of gait and mobility: Secondary | ICD-10-CM | POA: Diagnosis not present

## 2018-03-03 DIAGNOSIS — R262 Difficulty in walking, not elsewhere classified: Secondary | ICD-10-CM | POA: Diagnosis not present

## 2018-03-03 DIAGNOSIS — R29898 Other symptoms and signs involving the musculoskeletal system: Secondary | ICD-10-CM | POA: Diagnosis not present

## 2018-03-03 DIAGNOSIS — M6281 Muscle weakness (generalized): Secondary | ICD-10-CM | POA: Diagnosis not present

## 2018-03-04 DIAGNOSIS — R0902 Hypoxemia: Secondary | ICD-10-CM | POA: Diagnosis not present

## 2018-03-04 DIAGNOSIS — R0689 Other abnormalities of breathing: Secondary | ICD-10-CM | POA: Diagnosis not present

## 2018-03-04 DIAGNOSIS — J449 Chronic obstructive pulmonary disease, unspecified: Secondary | ICD-10-CM | POA: Diagnosis not present

## 2018-03-04 DIAGNOSIS — I509 Heart failure, unspecified: Secondary | ICD-10-CM | POA: Diagnosis not present

## 2018-03-18 DIAGNOSIS — G8929 Other chronic pain: Secondary | ICD-10-CM | POA: Diagnosis not present

## 2018-03-18 DIAGNOSIS — M25511 Pain in right shoulder: Secondary | ICD-10-CM | POA: Diagnosis not present

## 2018-03-18 DIAGNOSIS — M48062 Spinal stenosis, lumbar region with neurogenic claudication: Secondary | ICD-10-CM | POA: Diagnosis not present

## 2018-03-18 DIAGNOSIS — R29898 Other symptoms and signs involving the musculoskeletal system: Secondary | ICD-10-CM | POA: Diagnosis not present

## 2018-03-18 DIAGNOSIS — J449 Chronic obstructive pulmonary disease, unspecified: Secondary | ICD-10-CM | POA: Diagnosis not present

## 2018-03-20 DIAGNOSIS — R29898 Other symptoms and signs involving the musculoskeletal system: Secondary | ICD-10-CM | POA: Diagnosis not present

## 2018-03-20 DIAGNOSIS — M6281 Muscle weakness (generalized): Secondary | ICD-10-CM | POA: Diagnosis not present

## 2018-03-20 DIAGNOSIS — R262 Difficulty in walking, not elsewhere classified: Secondary | ICD-10-CM | POA: Diagnosis not present

## 2018-03-20 DIAGNOSIS — R2689 Other abnormalities of gait and mobility: Secondary | ICD-10-CM | POA: Diagnosis not present

## 2018-03-25 DIAGNOSIS — R29898 Other symptoms and signs involving the musculoskeletal system: Secondary | ICD-10-CM | POA: Diagnosis not present

## 2018-03-25 DIAGNOSIS — M6281 Muscle weakness (generalized): Secondary | ICD-10-CM | POA: Diagnosis not present

## 2018-03-25 DIAGNOSIS — R262 Difficulty in walking, not elsewhere classified: Secondary | ICD-10-CM | POA: Diagnosis not present

## 2018-03-25 DIAGNOSIS — R2689 Other abnormalities of gait and mobility: Secondary | ICD-10-CM | POA: Diagnosis not present

## 2018-03-27 DIAGNOSIS — M6281 Muscle weakness (generalized): Secondary | ICD-10-CM | POA: Diagnosis not present

## 2018-03-27 DIAGNOSIS — R2689 Other abnormalities of gait and mobility: Secondary | ICD-10-CM | POA: Diagnosis not present

## 2018-03-27 DIAGNOSIS — R262 Difficulty in walking, not elsewhere classified: Secondary | ICD-10-CM | POA: Diagnosis not present

## 2018-03-27 DIAGNOSIS — R29898 Other symptoms and signs involving the musculoskeletal system: Secondary | ICD-10-CM | POA: Diagnosis not present

## 2018-04-03 DIAGNOSIS — R262 Difficulty in walking, not elsewhere classified: Secondary | ICD-10-CM | POA: Diagnosis not present

## 2018-04-03 DIAGNOSIS — R2689 Other abnormalities of gait and mobility: Secondary | ICD-10-CM | POA: Diagnosis not present

## 2018-04-03 DIAGNOSIS — R29898 Other symptoms and signs involving the musculoskeletal system: Secondary | ICD-10-CM | POA: Diagnosis not present

## 2018-04-03 DIAGNOSIS — M6281 Muscle weakness (generalized): Secondary | ICD-10-CM | POA: Diagnosis not present

## 2018-04-03 NOTE — Progress Notes (Signed)
01-16-18 (Epic) Pulmonary clearance from Dr. Mortimer Fries on chart (Moderate Risk)  07-14-16 (Epic) ECHO  07-17-17 (Chart Everywhere) ECHO

## 2018-04-03 NOTE — Patient Instructions (Addendum)
Buna Cuppett  04/03/2018   Your procedure is scheduled on: 04-10-18    Report to [facility] Main  Entrance    Report to Admitting at 8:00 AM    Call this number if you have problems the morning of surgery [phone]     Remember: Do not eat food or drink liquids :After Midnight.   Take these medicines the morning of surgery with A SIP OF WATER: Levothyroxine (Synthroid). You may also use your Alprazolam (Xanax) as needed.  BRUSH YOUR TEETH MORNING OF SURGERY AND RINSE YOUR MOUTH OUT, NO CHEWING GUM CANDY OR MINTS.                               You may not have any metal on your body including hair pins and              piercings  Do not wear jewelry, make-up, lotions, powders or perfumes, deodorant             Do not wear nail polish.  Do not shave  48 hours prior to surgery.               Do not bring valuables to the hospital. [place].  Contacts, dentures or bridgework may not be worn into surgery.  Leave suitcase in the car. After surgery it may be brought to your room.     Patients discharged the day of surgery will not be allowed to drive home. IF YOU ARE HAVING SURGERY AND GOING HOME THE SAME DAY, YOU MUST HAVE AN ADULT TO DRIVE YOU HOME AND BE WITH YOU FOR 24 HOURS. YOU MAY GO HOME BY TAXI OR UBER OR ORTHERWISE, BUT AN ADULT MUST ACCOMPANY YOU HOME AND STAY WITH YOU FOR 24 HOURS.    Special Instructions: N/A              Please read over the following fact sheets you were given: _____________________________________________________________________             [facility] - Preparing for Surgery Before surgery, you can play an important role.  Because skin is not sterile, your skin needs to be as free of germs as possible.  You can reduce the number of germs on your skin by washing with CHG (chlorahexidine gluconate) soap before surgery.  CHG is an antiseptic cleaner which kills germs and  bonds with the skin to continue killing germs even after washing. Please DO NOT use if you have an allergy to CHG or antibacterial soaps.  If your skin becomes reddened/irritated stop using the CHG and inform your nurse when you arrive at Short Stay. Do not shave (including legs and underarms) for at least 48 hours prior to the first CHG shower.  You may shave your face/neck. Please follow these instructions carefully:  1.  Shower with CHG Soap the night before surgery and the  morning of Surgery.  2.  If you choose to wash your hair, wash your hair first as usual with your  normal  shampoo.  3.  After you shampoo, rinse your hair and body thoroughly to remove the  shampoo.  4.  Use CHG as you would any other liquid soap.  You can apply chg directly  to the skin and wash                       Gently with a scrungie or clean washcloth.  5.  Apply the CHG Soap to your body ONLY FROM THE NECK DOWN.   Do not use on face/ open                           Wound or open sores. Avoid contact with eyes, ears mouth and genitals (private parts).                       Wash face,  Genitals (private parts) with your normal soap.             6.  Wash thoroughly, paying special attention to the area where your surgery  will be performed.  7.  Thoroughly rinse your body with warm water from the neck down.  8.  DO NOT shower/wash with your normal soap after using and rinsing off  the CHG Soap.                9.  Pat yourself dry with a clean towel.            10.  Wear clean pajamas.            11.  Place clean sheets on your bed the night of your first shower and do not  sleep with pets. Day of Surgery : Do not apply any lotions/deodorants the morning of surgery.  Please wear clean clothes to the hospital/surgery center.  FAILURE TO FOLLOW THESE INSTRUCTIONS MAY RESULT IN THE CANCELLATION OF YOUR SURGERY PATIENT SIGNATURE_________________________________  NURSE  SIGNATURE__________________________________  ________________________________________________________________________

## 2018-04-04 ENCOUNTER — Other Ambulatory Visit: Payer: Self-pay

## 2018-04-04 ENCOUNTER — Encounter (HOSPITAL_COMMUNITY): Payer: Self-pay

## 2018-04-04 ENCOUNTER — Encounter (HOSPITAL_COMMUNITY)
Admission: RE | Admit: 2018-04-04 | Discharge: 2018-04-04 | Disposition: A | Discharge status: Home / Self Care | Payer: Medicare HMO | Source: Ambulatory Visit | Attending: Orthopedic Surgery | Admitting: Orthopedic Surgery

## 2018-04-04 DIAGNOSIS — M19011 Primary osteoarthritis, right shoulder: Secondary | ICD-10-CM | POA: Insufficient documentation

## 2018-04-04 DIAGNOSIS — Z01818 Encounter for other preprocedural examination: Secondary | ICD-10-CM | POA: Diagnosis not present

## 2018-04-04 DIAGNOSIS — I10 Essential (primary) hypertension: Secondary | ICD-10-CM | POA: Insufficient documentation

## 2018-04-04 DIAGNOSIS — R0902 Hypoxemia: Secondary | ICD-10-CM | POA: Diagnosis not present

## 2018-04-04 DIAGNOSIS — I509 Heart failure, unspecified: Secondary | ICD-10-CM | POA: Diagnosis not present

## 2018-04-04 DIAGNOSIS — J449 Chronic obstructive pulmonary disease, unspecified: Secondary | ICD-10-CM | POA: Diagnosis not present

## 2018-04-04 DIAGNOSIS — R0689 Other abnormalities of breathing: Secondary | ICD-10-CM | POA: Diagnosis not present

## 2018-04-04 LAB — CBC
HCT: 44.2 % (ref 36.0–46.0)
Hemoglobin: 13.5 g/dL (ref 12.0–15.0)
MCH: 29.6 pg (ref 26.0–34.0)
MCHC: 30.5 g/dL (ref 30.0–36.0)
MCV: 96.9 fL (ref 80.0–100.0)
Platelets: 210 10*3/uL (ref 150–400)
RBC: 4.56 MIL/uL (ref 3.87–5.11)
RDW: 13.4 % (ref 11.5–15.5)
WBC: 7.4 10*3/uL (ref 4.0–10.5)
nRBC: 0 % (ref 0.0–0.2)

## 2018-04-04 LAB — BASIC METABOLIC PANEL
Anion gap: 11 (ref 5–15)
BUN: 40 mg/dL — ABNORMAL HIGH (ref 8–23)
CO2: 29 mmol/L (ref 22–32)
Calcium: 9.3 mg/dL (ref 8.9–10.3)
Chloride: 100 mmol/L (ref 98–111)
Creatinine, Ser: 0.95 mg/dL (ref 0.44–1.00)
GFR calc non Af Amer: >60 mL/min (ref >60)
Glucose, Bld: 109 mg/dL — ABNORMAL HIGH (ref 70–99)
Potassium: 3.4 mmol/L — ABNORMAL LOW (ref 3.5–5.1)
Sodium: 140 mmol/L (ref 135–145)

## 2018-04-04 LAB — SURGICAL PCR SCREEN
MRSA, PCR: POSITIVE — AB
Staphylococcus aureus: POSITIVE — AB

## 2018-04-04 NOTE — Progress Notes (Signed)
PCP: Dr. Ramonita Lab III  CARDIOLOGIST:Tina Jackelyn Hoehn, FNP  INFO IN Epic: 1-20 (EKG), 07-14-16 (Epic) ECHO  INFO ON CHART: Pulmonary clearance in chart, Hx of COPD, Bipap. O2@2L .   BLOOD THINNERS AND LAST DOSES: ____________________________________  PATIENT SYMPTOMS AT TIME OF PREOP:

## 2018-04-04 NOTE — Progress Notes (Signed)
04-04-18 PCR and BMP routed to Dr. Justice Britain for review

## 2018-04-07 DIAGNOSIS — G63 Polyneuropathy in diseases classified elsewhere: Secondary | ICD-10-CM | POA: Diagnosis not present

## 2018-04-07 DIAGNOSIS — M48062 Spinal stenosis, lumbar region with neurogenic claudication: Secondary | ICD-10-CM | POA: Diagnosis not present

## 2018-04-07 DIAGNOSIS — J9611 Chronic respiratory failure with hypoxia: Secondary | ICD-10-CM | POA: Diagnosis not present

## 2018-04-07 DIAGNOSIS — I10 Essential (primary) hypertension: Secondary | ICD-10-CM | POA: Diagnosis not present

## 2018-04-07 DIAGNOSIS — E7800 Pure hypercholesterolemia, unspecified: Secondary | ICD-10-CM | POA: Diagnosis not present

## 2018-04-07 DIAGNOSIS — J438 Other emphysema: Secondary | ICD-10-CM | POA: Diagnosis not present

## 2018-04-07 DIAGNOSIS — I5032 Chronic diastolic (congestive) heart failure: Secondary | ICD-10-CM | POA: Diagnosis not present

## 2018-04-07 DIAGNOSIS — M159 Polyosteoarthritis, unspecified: Secondary | ICD-10-CM | POA: Diagnosis not present

## 2018-04-08 NOTE — Anesthesia Preprocedure Evaluation (Addendum)
Anesthesia Evaluation  Patient identified by MRN, date of birth, ID band Patient awake    Reviewed: Allergy & Precautions, H&P , NPO status , Patient's Chart, lab work & pertinent test results  Airway Mallampati: II  TM Distance: >3 FB Neck ROM: Full    Dental no notable dental hx. (+) Dental Advisory Given, Teeth Intact   Pulmonary sleep apnea and Continuous Positive Airway Pressure Ventilation , COPD, former smoker,    Pulmonary exam normal breath sounds clear to auscultation       Cardiovascular hypertension, Pt. on medications Normal cardiovascular exam Rhythm:Regular Rate:Normal  Echo 07/14/16 - Left ventricle: The cavity size was normal. Systolic function was   normal. The estimated ejection fraction was in the range of 55%   to 65%. - Mitral valve: Valve area by pressure half-time: 2.37 cm^2.    Neuro/Psych Anxiety negative neurological ROS     GI/Hepatic negative GI ROS, Neg liver ROS,   Endo/Other  Hypothyroidism   Renal/GU Renal disease     Musculoskeletal  (+) Arthritis ,   Abdominal (+) + obese,   Peds  Hematology negative hematology ROS (+)   Anesthesia Other Findings   Reproductive/Obstetrics                           Lab Results  Component Value Date   CREATININE 0.95 04/04/2018   BUN 40 (H) 04/04/2018   NA 140 04/04/2018   K 3.4 (L) 04/04/2018   CL 100 04/04/2018   CO2 29 04/04/2018    Lab Results  Component Value Date   WBC 7.4 04/04/2018   HGB 13.5 04/04/2018   HCT 44.2 04/04/2018   MCV 96.9 04/04/2018   PLT 210 04/04/2018    Anesthesia Physical Anesthesia Plan  ASA: III  Anesthesia Plan: General   Post-op Pain Management:  Regional for Post-op pain   Induction: Intravenous  PONV Risk Score and Plan: 3 and Treatment may vary due to age or medical condition, Ondansetron and Dexamethasone  Airway Management Planned: Oral ETT  Additional Equipment:    Intra-op Plan:   Post-operative Plan: Extubation in OR  Informed Consent: I have reviewed the patients History and Physical, chart, labs and discussed the procedure including the risks, benefits and alternatives for the proposed anesthesia with the patient or authorized representative who has indicated his/her understanding and acceptance.     Dental advisory given  Plan Discussed with: CRNA  Anesthesia Plan Comments: (See PST note 04/04/18, Konrad Felix, PA-C + ISB w Exparel)      Anesthesia Quick Evaluation

## 2018-04-08 NOTE — Progress Notes (Signed)
Anesthesia Chart Review   Case:  [id number] Date/Time:  04/10/18 0945   Procedure:  TOTAL SHOULDER ARTHROPLASTY (Right ) - 120mn   Anesthesia type:  General   Pre-op diagnosis:  right shoulder osteoarthritis   Location:  [facility] ROOM 07 / [facility]   Surgeon:  SJustice Britain MD      DISCUSSION: 73yo former smoker (10 pack years, quit 03/20/75) with h/o anxiety, hypothyroidism, RLS, CHF, HTN, CKD Stage III, COPD, sleep apnea (oxygen dependent 2L O2 and bipap at night) scheduled for above procedure on 04/10/18 with Dr. KJustice Britain    Pt seen by pulmonology for clearance on 01/06/18, Dr. KFlora Lipps  Per his note, "She has moderate severe obstructive airways disease with hypoxic and hypercapnic respiratory failure.  Patient benefiting and using her BiPAP at night and tolerating well. Patient also benefiting and using her oxygen at night and tolerating well. No signs of infection at this time. No signs of decompensated heart failure or COPD at this time. I have assessed the patient and she is a moderate postop pulmonary complications."  Pt last seen by PCP, Dr. BRamonita Lab on 02/24/18.  At this visit Dr. KCaryl Comesstates, "We have discussed that her risk for perioperative complication is in the moderate to high risk category due to her chronic medical illnesses, but at this point she appears to be medically optimized."  No complications noted on last anesthesia event 05/07/17 with MAC.   Pt can proceed with planned procedure barring acute status change.  VS: BP 138/83   Pulse 87   Temp 36.6 C (Oral)   Resp 20   Ht _0  (1.702 m)   Wt 112.9 kg   SpO2 98%   BMI 39.00 kg/m   PROVIDERS: KAdin Hector MD is PCP   KFlora Lipps MD is Pulmonologist  LABS: Labs reviewed: Acceptable for surgery. (all labs ordered are listed, but only abnormal results are displayed)  Labs Reviewed  SURGICAL PCR SCREEN - Abnormal; Notable for the following components:      Result Value   MRSA, PCR POSITIVE (*)    Staphylococcus aureus POSITIVE (*)    All other components within normal limits  BASIC METABOLIC PANEL - Abnormal; Notable for the following components:   Potassium 3.4 (*)    Glucose, Bld 109 (*)    BUN 40 (*)    All other components within normal limits  CBC     IMAGES: Chest Xray 09/24/16 FINDINGS: Stable cardiomediastinal silhouette. No pneumothorax or pleural effusion is noted. Stable mild central pulmonary vascular congestion is noted. No consolidative process is noted. Status post left shoulder arthroplasty.  IMPRESSION: Stable mild central pulmonary vascular congestion. No other significant abnormality seen in the chest.  EKG: 04/04/18 Rate 77 bpm Sinus rhythm with 1st degree A-V block Rightward axis Borderline ECG  CV: Echo 07/14/16 Study Conclusions  - Left ventricle: The cavity size was normal. Systolic function was   normal. The estimated ejection fraction was in the range of 55%   to 65%. - Aortic valve:   Structurally normal valve.   Cusp separation was normal.  Doppler:  Transvalvular velocity was within the normal range. There was no stenosis. There was no regurgitation. - Mitral valve: Valve area by pressure half-time: 2.37 cm^2. Past Medical History:  Diagnosis Date  . Anxiety   . Arthritis   . [place]ive heart failure) [place]   . COPD (chronic obstructive pulmonary disease) [place]   . Edema  feet/legs  . Hypertension    dr Otho Najjar     . Hypothyroidism   . Leg weakness, bilateral   . Osteoporosis   . Oxygen deficit    2l with bipap at night  . RLS (restless legs syndrome)   . Shortness of breath   . Sleep apnea    bipap  . Wheezing     Past Surgical History:  Procedure Laterality Date  . BACK SURGERY     cervical  . BREAST BIOPSY Left    needle bx-neg  . CATARACT EXTRACTION W/PHACO Left 05/07/2017   Procedure: CATARACT EXTRACTION PHACO AND INTRAOCULAR LENS PLACEMENT (IOC);  Surgeon: Birder Robson, MD;  Location: [facility]  [facility];  Service: Ophthalmology;  Laterality: Left;  [place] 00:48.7 AP% 11.3 CDE 5.46 Fluid Pack Lot # [id number]  . CATARACT EXTRACTION W/PHACO Right 04/23/2017   Procedure: CATARACT EXTRACTION PHACO AND INTRAOCULAR LENS PLACEMENT ([place]);  Surgeon: Birder Robson, MD;  Location: [facility]S;  Service: Ophthalmology;  Laterality: Right;  [place] 00:29 AP% 12.8 CDE 3.79 FLUID PACK LOT # [id number]  . FOOT ARTHROPLASTY    . JOINT REPLACEMENT     x2 tkr  . TOTAL SHOULDER ARTHROPLASTY Left 07/02/2013   Procedure: LEFT TOTAL SHOULDER ARTHROPLASTY;  Surgeon: Marin Shutter, MD;  Location: [facility];  Service: Orthopedics;  Laterality: Left;    MEDICATIONS: . albuterol (PROVENTIL) (2.5 MG/3ML) 0.083% nebulizer solution  . ALPRAZolam (XANAX) 0.25 MG tablet  . azithromycin (ZITHROMAX) 250 MG tablet  . B Complex-C (B-COMPLEX WITH VITAMIN C) tablet  . Calcium Carbonate-Vitamin D (CALCIUM-VITAMIN D) 500-200 MG-UNIT per tablet  . cyclobenzaprine (FLEXERIL) 10 MG tablet  . ferrous sulfate 325 (65 FE) MG tablet  . fluticasone furoate-vilanterol (BREO ELLIPTA) 200-25 MCG/INH AEPB  . furosemide (LASIX) 20 MG tablet  . gabapentin (NEURONTIN) 300 MG capsule  . HYDROcodone-acetaminophen (NORCO) 7.5-325 MG tablet  . ibuprofen (ADVIL,MOTRIN) 200 MG tablet  . ipratropium-albuterol (DUONEB) 0.5-2.5 (3) MG/3ML SOLN  . levothyroxine (SYNTHROID, LEVOTHROID) 200 MCG tablet  . loratadine-pseudoephedrine (CLARITIN-D 24-HOUR) 10-240 MG 24 hr tablet  . losartan (COZAAR) 25 MG tablet  . Multiple Vitamins-Minerals (MULTIVITAMIN WOMEN PO)  . Omega-3 Fatty Acids (OMEGA 3 PO)  . oxybutynin (DITROPAN) 5 MG tablet  . potassium chloride SA (K-DUR,KLOR-CON) 20 MEQ tablet  . pramipexole (MIRAPEX) 1 MG tablet  . spironolactone (ALDACTONE) 25 MG tablet  . topiramate (TOPAMAX) 100 MG tablet  . traMADol (ULTRAM) 50 MG tablet  . zolpidem (AMBIEN) 10 MG tablet   No current facility-administered medications for this encounter.      Maia Plan [facility] Pre-Surgical Testing [phone] 04/08/18 12:04 PM

## 2018-04-10 ENCOUNTER — Other Ambulatory Visit: Payer: Self-pay

## 2018-04-10 ENCOUNTER — Inpatient Hospital Stay (HOSPITAL_COMMUNITY): Payer: Medicare HMO | Admitting: Anesthesiology

## 2018-04-10 ENCOUNTER — Inpatient Hospital Stay (HOSPITAL_COMMUNITY): Payer: Medicare HMO | Admitting: Physician Assistant

## 2018-04-10 ENCOUNTER — Inpatient Hospital Stay (HOSPITAL_COMMUNITY)
Admission: RE | Admit: 2018-04-10 | Discharge: 2018-04-15 | DRG: 483 | Disposition: A | Discharge status: Skilled Nursing Facility | Payer: Medicare HMO | Attending: Orthopedic Surgery | Admitting: Orthopedic Surgery

## 2018-04-10 ENCOUNTER — Encounter (HOSPITAL_COMMUNITY): Payer: Self-pay | Admitting: Anesthesiology

## 2018-04-10 ENCOUNTER — Encounter (HOSPITAL_COMMUNITY)
Admission: RE | Disposition: A | Discharge status: Skilled Nursing Facility | Payer: Self-pay | Source: Home / Self Care | Attending: Orthopedic Surgery

## 2018-04-10 DIAGNOSIS — M19011 Primary osteoarthritis, right shoulder: Secondary | ICD-10-CM | POA: Diagnosis not present

## 2018-04-10 DIAGNOSIS — Z8249 Family history of ischemic heart disease and other diseases of the circulatory system: Secondary | ICD-10-CM

## 2018-04-10 DIAGNOSIS — Z79899 Other long term (current) drug therapy: Secondary | ICD-10-CM | POA: Diagnosis not present

## 2018-04-10 DIAGNOSIS — Z96612 Presence of left artificial shoulder joint: Secondary | ICD-10-CM | POA: Diagnosis present

## 2018-04-10 DIAGNOSIS — I509 Heart failure, unspecified: Secondary | ICD-10-CM | POA: Diagnosis present

## 2018-04-10 DIAGNOSIS — Z96653 Presence of artificial knee joint, bilateral: Secondary | ICD-10-CM | POA: Diagnosis present

## 2018-04-10 DIAGNOSIS — J449 Chronic obstructive pulmonary disease, unspecified: Secondary | ICD-10-CM | POA: Diagnosis present

## 2018-04-10 DIAGNOSIS — F419 Anxiety disorder, unspecified: Secondary | ICD-10-CM | POA: Diagnosis not present

## 2018-04-10 DIAGNOSIS — Z87891 Personal history of nicotine dependence: Secondary | ICD-10-CM | POA: Diagnosis not present

## 2018-04-10 DIAGNOSIS — I2510 Atherosclerotic heart disease of native coronary artery without angina pectoris: Secondary | ICD-10-CM | POA: Diagnosis not present

## 2018-04-10 DIAGNOSIS — I10 Essential (primary) hypertension: Secondary | ICD-10-CM | POA: Diagnosis not present

## 2018-04-10 DIAGNOSIS — I110 Hypertensive heart disease with heart failure: Secondary | ICD-10-CM | POA: Diagnosis not present

## 2018-04-10 DIAGNOSIS — E039 Hypothyroidism, unspecified: Secondary | ICD-10-CM | POA: Diagnosis present

## 2018-04-10 DIAGNOSIS — M25511 Pain in right shoulder: Secondary | ICD-10-CM | POA: Diagnosis present

## 2018-04-10 DIAGNOSIS — I5032 Chronic diastolic (congestive) heart failure: Secondary | ICD-10-CM | POA: Diagnosis not present

## 2018-04-10 DIAGNOSIS — M6281 Muscle weakness (generalized): Secondary | ICD-10-CM | POA: Diagnosis not present

## 2018-04-10 DIAGNOSIS — Z9841 Cataract extraction status, right eye: Secondary | ICD-10-CM | POA: Diagnosis not present

## 2018-04-10 DIAGNOSIS — Z96611 Presence of right artificial shoulder joint: Secondary | ICD-10-CM

## 2018-04-10 DIAGNOSIS — Z961 Presence of intraocular lens: Secondary | ICD-10-CM | POA: Diagnosis present

## 2018-04-10 DIAGNOSIS — N3281 Overactive bladder: Secondary | ICD-10-CM | POA: Diagnosis not present

## 2018-04-10 DIAGNOSIS — G8918 Other acute postprocedural pain: Secondary | ICD-10-CM | POA: Diagnosis not present

## 2018-04-10 DIAGNOSIS — Z9981 Dependence on supplemental oxygen: Secondary | ICD-10-CM | POA: Diagnosis not present

## 2018-04-10 DIAGNOSIS — G2581 Restless legs syndrome: Secondary | ICD-10-CM | POA: Diagnosis present

## 2018-04-10 DIAGNOSIS — M810 Age-related osteoporosis without current pathological fracture: Secondary | ICD-10-CM | POA: Diagnosis present

## 2018-04-10 DIAGNOSIS — Z79890 Hormone replacement therapy (postmenopausal): Secondary | ICD-10-CM | POA: Diagnosis not present

## 2018-04-10 DIAGNOSIS — Z9842 Cataract extraction status, left eye: Secondary | ICD-10-CM

## 2018-04-10 DIAGNOSIS — E876 Hypokalemia: Secondary | ICD-10-CM | POA: Diagnosis not present

## 2018-04-10 HISTORY — PX: TOTAL SHOULDER ARTHROPLASTY: SHX126

## 2018-04-10 SURGERY — ARTHROPLASTY, SHOULDER, TOTAL
Anesthesia: General | Site: Shoulder | Laterality: Right

## 2018-04-10 MED ORDER — FLUTICASONE FUROATE-VILANTEROL 200-25 MCG/INH IN AEPB
1.0000 | INHALATION_SPRAY | Freq: Every day | RESPIRATORY_TRACT | Status: DC
  Administered 2018-04-10 – 2018-04-15 (×6): 1 via RESPIRATORY_TRACT
  Filled 2018-04-10: qty 28

## 2018-04-10 MED ORDER — METOCLOPRAMIDE HCL 5 MG/ML IJ SOLN: 5.0000 mg | Freq: Three times a day (TID) | INTRAMUSCULAR | Status: DC | PRN

## 2018-04-10 MED ORDER — ALBUTEROL SULFATE (2.5 MG/3ML) 0.083% IN NEBU: 2.5000 mg | INHALATION_SOLUTION | Freq: Four times a day (QID) | RESPIRATORY_TRACT | Status: DC | PRN

## 2018-04-10 MED ORDER — ACETAMINOPHEN 500 MG PO TABS
1000.0000 mg | ORAL_TABLET | Freq: Once | ORAL | Status: AC
  Administered 2018-04-10: 1000 mg via ORAL
  Filled 2018-04-10: qty 2

## 2018-04-10 MED ORDER — ALUM & MAG HYDROXIDE-SIMETH 200-200-20 MG/5ML PO SUSP: 30.0000 mL | ORAL | Status: DC | PRN

## 2018-04-10 MED ORDER — AZITHROMYCIN 250 MG PO TABS
250.0000 mg | ORAL_TABLET | Freq: Every day | ORAL | Status: DC
  Administered 2018-04-10 – 2018-04-15 (×6): 250 mg via ORAL
  Filled 2018-04-10 (×6): qty 1

## 2018-04-10 MED ORDER — BUPIVACAINE HCL (PF) 0.5 % IJ SOLN
INTRAMUSCULAR | Status: DC | PRN
  Administered 2018-04-10: 15 mL via PERINEURAL

## 2018-04-10 MED ORDER — ONDANSETRON HCL 4 MG/2ML IJ SOLN: 4.0000 mg | Freq: Four times a day (QID) | INTRAMUSCULAR | Status: DC | PRN

## 2018-04-10 MED ORDER — TOPIRAMATE 100 MG PO TABS
100.0000 mg | ORAL_TABLET | Freq: Two times a day (BID) | ORAL | Status: DC
  Administered 2018-04-10 – 2018-04-15 (×11): 100 mg via ORAL
  Filled 2018-04-10 (×11): qty 1

## 2018-04-10 MED ORDER — CEFAZOLIN SODIUM-DEXTROSE 2-4 GM/100ML-% IV SOLN
2.0000 g | INTRAVENOUS | Status: AC
  Administered 2018-04-10: 2 g via INTRAVENOUS
  Filled 2018-04-10: qty 100

## 2018-04-10 MED ORDER — PRAMIPEXOLE DIHYDROCHLORIDE 1 MG PO TABS
4.0000 mg | ORAL_TABLET | Freq: Every day | ORAL | Status: DC
  Administered 2018-04-10 – 2018-04-14 (×5): 4 mg via ORAL
  Filled 2018-04-10 (×5): qty 4

## 2018-04-10 MED ORDER — LACTATED RINGERS IV SOLN
INTRAVENOUS | Status: DC
  Administered 2018-04-10: 09:00:00 via INTRAVENOUS

## 2018-04-10 MED ORDER — OXYCODONE HCL 5 MG PO TABS
5.0000 mg | ORAL_TABLET | ORAL | Status: DC | PRN
  Administered 2018-04-12 – 2018-04-15 (×4): 5 mg via ORAL
  Filled 2018-04-10 (×3): qty 1

## 2018-04-10 MED ORDER — SUGAMMADEX SODIUM 500 MG/5ML IV SOLN
INTRAVENOUS | Status: AC
  Filled 2018-04-10: qty 5

## 2018-04-10 MED ORDER — GABAPENTIN 300 MG PO CAPS
300.0000 mg | ORAL_CAPSULE | Freq: Every day | ORAL | Status: DC
  Administered 2018-04-10 – 2018-04-14 (×5): 300 mg via ORAL
  Filled 2018-04-10 (×5): qty 1

## 2018-04-10 MED ORDER — MENTHOL 3 MG MT LOZG: 1.0000 | LOZENGE | OROMUCOSAL | Status: DC | PRN

## 2018-04-10 MED ORDER — LIDOCAINE 2% (20 MG/ML) 5 ML SYRINGE
INTRAMUSCULAR | Status: AC
  Filled 2018-04-10: qty 5

## 2018-04-10 MED ORDER — PROPOFOL 10 MG/ML IV BOLUS
INTRAVENOUS | Status: AC
  Filled 2018-04-10: qty 20

## 2018-04-10 MED ORDER — ONDANSETRON HCL 4 MG PO TABS: 4.0000 mg | ORAL_TABLET | Freq: Four times a day (QID) | ORAL | Status: DC | PRN

## 2018-04-10 MED ORDER — POLYETHYLENE GLYCOL 3350 17 G PO PACK: 17.0000 g | PACK | Freq: Every day | ORAL | Status: DC | PRN

## 2018-04-10 MED ORDER — ACETAMINOPHEN 325 MG PO TABS: 325.0000 mg | ORAL_TABLET | Freq: Four times a day (QID) | ORAL | Status: DC | PRN

## 2018-04-10 MED ORDER — LIDOCAINE 2% (20 MG/ML) 5 ML SYRINGE
INTRAMUSCULAR | Status: DC | PRN
  Administered 2018-04-10: 60 mg via INTRAVENOUS

## 2018-04-10 MED ORDER — CYCLOBENZAPRINE HCL 10 MG PO TABS
10.0000 mg | ORAL_TABLET | Freq: Three times a day (TID) | ORAL | Status: DC | PRN
  Administered 2018-04-11 – 2018-04-15 (×8): 10 mg via ORAL
  Filled 2018-04-10 (×10): qty 1

## 2018-04-10 MED ORDER — ALPRAZOLAM 0.25 MG PO TABS
0.2500 mg | ORAL_TABLET | Freq: Three times a day (TID) | ORAL | Status: DC | PRN
  Administered 2018-04-11 – 2018-04-15 (×4): 0.25 mg via ORAL
  Filled 2018-04-10 (×5): qty 1

## 2018-04-10 MED ORDER — HYDROMORPHONE HCL 1 MG/ML IJ SOLN: 0.5000 mg | INTRAMUSCULAR | Status: DC | PRN

## 2018-04-10 MED ORDER — ZOLPIDEM TARTRATE 5 MG PO TABS
5.0000 mg | ORAL_TABLET | Freq: Every evening | ORAL | Status: DC | PRN
  Administered 2018-04-11 – 2018-04-13 (×3): 5 mg via ORAL
  Filled 2018-04-10 (×3): qty 1

## 2018-04-10 MED ORDER — LEVOTHYROXINE SODIUM 200 MCG PO TABS
200.0000 ug | ORAL_TABLET | Freq: Every day | ORAL | Status: DC
  Administered 2018-04-11 – 2018-04-15 (×5): 200 ug via ORAL
  Filled 2018-04-10: qty 2
  Filled 2018-04-10 (×3): qty 1
  Filled 2018-04-10 (×2): qty 2
  Filled 2018-04-10: qty 1
  Filled 2018-04-10: qty 2
  Filled 2018-04-10: qty 1
  Filled 2018-04-10: qty 2

## 2018-04-10 MED ORDER — FENTANYL CITRATE (PF) 100 MCG/2ML IJ SOLN
50.0000 ug | INTRAMUSCULAR | Status: DC
  Administered 2018-04-10: 25 ug via INTRAVENOUS
  Administered 2018-04-10: 50 ug via INTRAVENOUS
  Filled 2018-04-10: qty 2

## 2018-04-10 MED ORDER — FENTANYL CITRATE (PF) 100 MCG/2ML IJ SOLN
INTRAMUSCULAR | Status: AC
  Administered 2018-04-10: 25 ug via INTRAVENOUS
  Filled 2018-04-10: qty 2

## 2018-04-10 MED ORDER — KETOROLAC TROMETHAMINE 15 MG/ML IJ SOLN
7.5000 mg | Freq: Four times a day (QID) | INTRAMUSCULAR | Status: AC
  Administered 2018-04-10 – 2018-04-11 (×4): 7.5 mg via INTRAVENOUS
  Filled 2018-04-10 (×3): qty 1

## 2018-04-10 MED ORDER — BISACODYL 5 MG PO TBEC: 5.0000 mg | DELAYED_RELEASE_TABLET | Freq: Every day | ORAL | Status: DC | PRN

## 2018-04-10 MED ORDER — BUPIVACAINE LIPOSOME 1.3 % IJ SUSP
INTRAMUSCULAR | Status: DC | PRN
  Administered 2018-04-10: 10 mL via PERINEURAL

## 2018-04-10 MED ORDER — FUROSEMIDE 40 MG PO TABS
40.0000 mg | ORAL_TABLET | Freq: Two times a day (BID) | ORAL | Status: DC
  Administered 2018-04-10 – 2018-04-15 (×6): 40 mg via ORAL
  Filled 2018-04-10 (×9): qty 1

## 2018-04-10 MED ORDER — ACETAMINOPHEN 10 MG/ML IV SOLN: 1000.0000 mg | Freq: Once | INTRAVENOUS | Status: DC | PRN

## 2018-04-10 MED ORDER — VANCOMYCIN HCL 10 G IV SOLR
1500.0000 mg | INTRAVENOUS | Status: AC
  Administered 2018-04-10: 1500 mg via INTRAVENOUS
  Filled 2018-04-10: qty 1500

## 2018-04-10 MED ORDER — OXYBUTYNIN CHLORIDE 5 MG PO TABS
5.0000 mg | ORAL_TABLET | Freq: Three times a day (TID) | ORAL | Status: DC
  Administered 2018-04-10 – 2018-04-15 (×15): 5 mg via ORAL
  Filled 2018-04-10 (×15): qty 1

## 2018-04-10 MED ORDER — STERILE WATER FOR IRRIGATION IR SOLN
Status: DC | PRN
  Administered 2018-04-10: 2000 mL

## 2018-04-10 MED ORDER — PROMETHAZINE HCL 25 MG/ML IJ SOLN: 6.2500 mg | INTRAMUSCULAR | Status: DC | PRN

## 2018-04-10 MED ORDER — ONDANSETRON HCL 4 MG/2ML IJ SOLN
INTRAMUSCULAR | Status: DC | PRN
  Administered 2018-04-10: 4 mg via INTRAVENOUS

## 2018-04-10 MED ORDER — PHENYLEPHRINE 40 MCG/ML (10ML) SYRINGE FOR IV PUSH (FOR BLOOD PRESSURE SUPPORT)
PREFILLED_SYRINGE | INTRAVENOUS | Status: AC
  Filled 2018-04-10: qty 10

## 2018-04-10 MED ORDER — 0.9 % SODIUM CHLORIDE (POUR BTL) OPTIME
TOPICAL | Status: DC | PRN
  Administered 2018-04-10: 1000 mL

## 2018-04-10 MED ORDER — MEPERIDINE HCL 50 MG/ML IJ SOLN: 6.2500 mg | INTRAMUSCULAR | Status: DC | PRN

## 2018-04-10 MED ORDER — GABAPENTIN 300 MG PO CAPS
300.0000 mg | ORAL_CAPSULE | Freq: Once | ORAL | Status: AC
  Administered 2018-04-10: 300 mg via ORAL
  Filled 2018-04-10: qty 1

## 2018-04-10 MED ORDER — DIPHENHYDRAMINE HCL 12.5 MG/5ML PO ELIX: 12.5000 mg | ORAL_SOLUTION | ORAL | Status: DC | PRN

## 2018-04-10 MED ORDER — OXYCODONE HCL 5 MG PO TABS
10.0000 mg | ORAL_TABLET | ORAL | Status: DC | PRN
  Administered 2018-04-11 – 2018-04-15 (×14): 10 mg via ORAL
  Filled 2018-04-10 (×16): qty 2

## 2018-04-10 MED ORDER — MIDAZOLAM HCL 2 MG/2ML IJ SOLN
1.0000 mg | INTRAMUSCULAR | Status: DC
  Filled 2018-04-10: qty 2

## 2018-04-10 MED ORDER — HYDROCODONE-ACETAMINOPHEN 7.5-325 MG PO TABS: 1.0000 | ORAL_TABLET | Freq: Once | ORAL | Status: DC | PRN

## 2018-04-10 MED ORDER — LOSARTAN POTASSIUM 25 MG PO TABS
25.0000 mg | ORAL_TABLET | Freq: Every day | ORAL | Status: DC
  Administered 2018-04-10 – 2018-04-15 (×6): 25 mg via ORAL
  Filled 2018-04-10 (×6): qty 1

## 2018-04-10 MED ORDER — CHLORHEXIDINE GLUCONATE 4 % EX LIQD: 60.0000 mL | Freq: Once | CUTANEOUS | Status: DC

## 2018-04-10 MED ORDER — PHENYLEPHRINE 40 MCG/ML (10ML) SYRINGE FOR IV PUSH (FOR BLOOD PRESSURE SUPPORT)
PREFILLED_SYRINGE | INTRAVENOUS | Status: DC | PRN
  Administered 2018-04-10 (×7): 80 ug via INTRAVENOUS

## 2018-04-10 MED ORDER — ROCURONIUM BROMIDE 100 MG/10ML IV SOLN
INTRAVENOUS | Status: AC
  Filled 2018-04-10: qty 1

## 2018-04-10 MED ORDER — PHENOL 1.4 % MT LIQD
1.0000 | OROMUCOSAL | Status: DC | PRN
  Filled 2018-04-10: qty 177

## 2018-04-10 MED ORDER — ROCURONIUM BROMIDE 10 MG/ML (PF) SYRINGE
PREFILLED_SYRINGE | INTRAVENOUS | Status: DC | PRN
  Administered 2018-04-10: 60 mg via INTRAVENOUS

## 2018-04-10 MED ORDER — DEXAMETHASONE SODIUM PHOSPHATE 10 MG/ML IJ SOLN
INTRAMUSCULAR | Status: DC | PRN
  Administered 2018-04-10: 10 mg via INTRAVENOUS

## 2018-04-10 MED ORDER — LACTATED RINGERS IV SOLN
INTRAVENOUS | Status: DC
  Administered 2018-04-10: 13:00:00 via INTRAVENOUS

## 2018-04-10 MED ORDER — SPIRONOLACTONE 25 MG PO TABS
25.0000 mg | ORAL_TABLET | Freq: Two times a day (BID) | ORAL | Status: DC
  Administered 2018-04-10 – 2018-04-15 (×10): 25 mg via ORAL
  Filled 2018-04-10 (×11): qty 1

## 2018-04-10 MED ORDER — HYDROMORPHONE HCL 1 MG/ML IJ SOLN: 0.2500 mg | INTRAMUSCULAR | Status: DC | PRN

## 2018-04-10 MED ORDER — TRAMADOL HCL 50 MG PO TABS
50.0000 mg | ORAL_TABLET | Freq: Four times a day (QID) | ORAL | Status: DC | PRN
  Administered 2018-04-11: 50 mg via ORAL
  Filled 2018-04-10: qty 1

## 2018-04-10 MED ORDER — SUGAMMADEX SODIUM 500 MG/5ML IV SOLN
INTRAVENOUS | Status: DC | PRN
  Administered 2018-04-10: 250 mg via INTRAVENOUS

## 2018-04-10 MED ORDER — PROPOFOL 10 MG/ML IV BOLUS
INTRAVENOUS | Status: DC | PRN
  Administered 2018-04-10: 120 mg via INTRAVENOUS

## 2018-04-10 MED ORDER — LEVOTHYROXINE SODIUM 200 MCG PO TABS: 200.0000 ug | ORAL_TABLET | Freq: Every day | ORAL | Status: DC

## 2018-04-10 MED ORDER — TRANEXAMIC ACID-NACL 1000-0.7 MG/100ML-% IV SOLN
1000.0000 mg | INTRAVENOUS | Status: AC
  Administered 2018-04-10: 1000 mg via INTRAVENOUS
  Filled 2018-04-10: qty 100

## 2018-04-10 MED ORDER — DOCUSATE SODIUM 100 MG PO CAPS
100.0000 mg | ORAL_CAPSULE | Freq: Two times a day (BID) | ORAL | Status: DC
  Administered 2018-04-11 – 2018-04-15 (×6): 100 mg via ORAL
  Filled 2018-04-10 (×12): qty 1

## 2018-04-10 MED ORDER — METOCLOPRAMIDE HCL 5 MG PO TABS: 5.0000 mg | ORAL_TABLET | Freq: Three times a day (TID) | ORAL | Status: DC | PRN

## 2018-04-10 MED ORDER — MAGNESIUM CITRATE PO SOLN: 1.0000 | Freq: Once | ORAL | Status: DC | PRN

## 2018-04-10 SURGICAL SUPPLY — 62 items
ADH SKN CLS APL DERMABOND .7 (GAUZE/BANDAGES/DRESSINGS) ×1
BAG SPEC THK2 15X12 ZIP CLS (MISCELLANEOUS) ×1
BAG ZIPLOCK 12X15 (MISCELLANEOUS) ×2 IMPLANT
BIT DRILL 2.0X128 (BIT) ×2 IMPLANT
BLADE SAW SGTL 83.5X18.5 (BLADE) ×2 IMPLANT
BSPLAT GLND +2X24 MDLR (Joint) ×1 IMPLANT
COVER SURGICAL LIGHT HANDLE (MISCELLANEOUS) ×2 IMPLANT
COVER WAND RF STERILE (DRAPES) IMPLANT
CUP SUT UNIV REVERS 36+2 RT (Cup) ×1 IMPLANT
DERMABOND ADVANCED (GAUZE/BANDAGES/DRESSINGS) ×1
DERMABOND ADVANCED .7 DNX12 (GAUZE/BANDAGES/DRESSINGS) ×1 IMPLANT
DRAPE ORTHO SPLIT 77X108 STRL (DRAPES) ×4
DRAPE SURG 17X11 SM STRL (DRAPES) ×2 IMPLANT
DRAPE SURG ORHT 6 SPLT 77X108 (DRAPES) ×2 IMPLANT
DRAPE U-SHAPE 47X51 STRL (DRAPES) ×2 IMPLANT
DRSG AQUACEL AG ADV 3.5X10 (GAUZE/BANDAGES/DRESSINGS) ×2 IMPLANT
DURAPREP 26ML APPLICATOR (WOUND CARE) ×4 IMPLANT
ELECT BLADE TIP CTD 4 INCH (ELECTRODE) ×2 IMPLANT
ELECT REM PT RETURN 15FT ADLT (MISCELLANEOUS) ×2 IMPLANT
FACESHIELD WRAPAROUND (MASK) ×6 IMPLANT
FACESHIELD WRAPAROUND OR TEAM (MASK) ×3 IMPLANT
GLENOID UNI REV MOD 24 +2 LAT (Joint) ×1 IMPLANT
GLENOSPHERE 36 +4 LAT/24 (Joint) ×1 IMPLANT
GLOVE BIO SURGEON STRL SZ7.5 (GLOVE) ×2 IMPLANT
GLOVE BIO SURGEON STRL SZ8 (GLOVE) ×2 IMPLANT
GLOVE SS BIOGEL STRL SZ 7 (GLOVE) ×1 IMPLANT
GLOVE SS BIOGEL STRL SZ 7.5 (GLOVE) ×1 IMPLANT
GLOVE SUPERSENSE BIOGEL SZ 7 (GLOVE) ×1
GLOVE SUPERSENSE BIOGEL SZ 7.5 (GLOVE) ×1
GOWN STRL REUS W/TWL LRG LVL3 (GOWN DISPOSABLE) ×4 IMPLANT
KIT BASIN OR (CUSTOM PROCEDURE TRAY) ×2 IMPLANT
LINER HUMERAL 36 +3MM SM (Shoulder) ×1 IMPLANT
MANIFOLD NEPTUNE II (INSTRUMENTS) ×2 IMPLANT
MARKER SKIN DUAL TIP RULER LAB (MISCELLANEOUS) ×1 IMPLANT
NDL TAPERED W/ NITINOL LOOP (MISCELLANEOUS) ×1 IMPLANT
NEEDLE TAPERED W/ NITINOL LOOP (MISCELLANEOUS) ×2 IMPLANT
NS IRRIG 1000ML POUR BTL (IV SOLUTION) ×2 IMPLANT
PACK SHOULDER (CUSTOM PROCEDURE TRAY) ×2 IMPLANT
PIN SET MODULAR GLENOID SYSTEM (PIN) ×1 IMPLANT
PROTECTOR NERVE ULNAR (MISCELLANEOUS) ×2 IMPLANT
SCREW CENTRAL MODULAR 25 (Screw) ×1 IMPLANT
SCREW PERI LOCK 5.5X16 (Screw) ×2 IMPLANT
SCREW PERI LOCK 5.5X32 (Screw) ×1 IMPLANT
SCREW PERIPHERAL 5.5X28 LOCK (Screw) ×1 IMPLANT
SLING ARM FOAM STRAP LRG (SOFTGOODS) ×2 IMPLANT
SMARTMIX MINI TOWER (MISCELLANEOUS)
SPACER SHLD UNI REV 36+9 (Spacer) ×1 IMPLANT
SPONGE LAP 4X18 RFD (DISPOSABLE) IMPLANT
STEM HUMERAL UNIVERS SZ8 (Stem) ×1 IMPLANT
SUCTION FRAZIER HANDLE 12FR (TUBING) ×1
SUCTION TUBE FRAZIER 12FR DISP (TUBING) ×1 IMPLANT
SUT FIBERWIRE #2 38 T-5 BLUE (SUTURE) ×4
SUT MNCRL AB 3-0 PS2 18 (SUTURE) ×2 IMPLANT
SUT MON AB 2-0 CT1 36 (SUTURE) ×2 IMPLANT
SUT VIC AB 1 CT1 36 (SUTURE) ×4 IMPLANT
SUTURE FIBERWR #2 38 T-5 BLUE (SUTURE) ×2 IMPLANT
SUTURE TAPE 1.3 40 TPR END (SUTURE) ×3 IMPLANT
SUTURETAPE 1.3 40 TPR END (SUTURE) ×4
TOWEL OR 17X26 10 PK STRL BLUE (TOWEL DISPOSABLE) ×2 IMPLANT
TOWEL OR NON WOVEN STRL DISP B (DISPOSABLE) ×2 IMPLANT
TOWER SMARTMIX MINI (MISCELLANEOUS) IMPLANT
WATER STERILE IRR 1000ML POUR (IV SOLUTION) ×4 IMPLANT

## 2018-04-10 NOTE — H&P (Signed)
Hannah Powell    Chief Complaint: right shoulder osteoarthritis HPI: The patient is a 73 y.o. female with end stage right shoulder OA  Past Medical History:  Diagnosis Date  . Anxiety   . Arthritis   . CHF (congestive heart failure) ([place])   . COPD (chronic obstructive pulmonary disease) ([place])   . Edema    feet/legs  . Hypertension    dr Otho Najjar     . Hypothyroidism   . Leg weakness, bilateral   . Osteoporosis   . Oxygen deficit    2l with bipap at night  . RLS (restless legs syndrome)   . Shortness of breath   . Sleep apnea    bipap  . Wheezing     Past Surgical History:  Procedure Laterality Date  . BACK SURGERY     cervical  . BREAST BIOPSY Left    needle bx-neg  . CATARACT EXTRACTION W/PHACO Left 05/07/2017   Procedure: CATARACT EXTRACTION PHACO AND INTRAOCULAR LENS PLACEMENT (I[facility]);  Surgeon: Birder Robson, MD;  Location: [facility]S;  Service: Ophthalmology;  Laterality: Left;  [place] 00:48.7 AP% 11.3 CDE 5.46 Fluid Pack Lot # [id number]  . CATARACT EXTRACTION W/PHACO Right 04/23/2017   Procedure: CATARACT EXTRACTION PHACO AND INTRAOCULAR LENS PLACEMENT ([place]);  Surgeon: Birder Robson, MD;  Location: [facility]S;  Service: Ophthalmology;  Laterality: Right;  [place] 00:29 AP% 12.8 CDE 3.79 FLUID PACK LOT # [id number]  . FOOT ARTHROPLASTY    . JOINT REPLACEMENT     x2 tkr  . TOTAL SHOULDER ARTHROPLASTY Left 07/02/2013   Procedure: LEFT TOTAL SHOULDER ARTHROPLASTY;  Surgeon: Marin Shutter, MD;  Location: [facility];  Service: Orthopedics;  Laterality: Left;    Family History  Problem Relation Age of Onset  . Hypertension Mother   . Diabetes Mother   . Heart failure Father   . Breast cancer Neg Hx     Social History:  reports that she quit smoking about 43 years ago. She has a 10.00 pack-year smoking history. She has never used smokeless tobacco. She reports that she does not drink alcohol or use drugs.   Medications Prior to Admission  Medication Sig Dispense Refill   . ALPRAZolam (XANAX) 0.25 MG tablet Take 1 tablet (0.25 mg total) by mouth 3 (three) times daily as needed for anxiety. 30 tablet 0  . azithromycin (ZITHROMAX) 250 MG tablet Take 250 mg by mouth daily.    . B Complex-C (B-COMPLEX WITH VITAMIN C) tablet Take 1 tablet by mouth daily.     . Calcium Carbonate-Vitamin D (CALCIUM-VITAMIN D) 500-200 MG-UNIT per tablet Take 1 tablet by mouth daily.    . ferrous sulfate 325 (65 FE) MG tablet Take 325 mg by mouth daily with breakfast.    . fluticasone furoate-vilanterol (BREO ELLIPTA) 200-25 MCG/INH AEPB Inhale 1 puff into the lungs daily. 60 each 0  . furosemide (LASIX) 20 MG tablet Take 2 tablets (40 mg total) by mouth 2 (two) times daily. (Patient taking differently: Take 40-80 mg by mouth See admin instructions. Take 80 mg by mouth in the morning and take 40 mg by mouth at lunch)    . gabapentin (NEURONTIN) 300 MG capsule Take 300 mg by mouth at bedtime.     Marland Kitchen HYDROcodone-acetaminophen (NORCO) 7.5-325 MG tablet Take 1 tablet by mouth every 8 (eight) hours as needed for moderate pain.    Marland Kitchen ibuprofen (ADVIL,MOTRIN) 200 MG tablet Take 800 mg by mouth every 6 (six) hours as needed for headache  or mild pain.     Marland Kitchen levothyroxine (SYNTHROID, LEVOTHROID) 200 MCG tablet Take 200 mcg by mouth daily before breakfast.    . losartan (COZAAR) 25 MG tablet Take 25 mg by mouth daily.    . Multiple Vitamins-Minerals (MULTIVITAMIN WOMEN PO) Take 1 tablet by mouth daily.    . Omega-3 Fatty Acids (OMEGA 3 PO) Take 1 capsule by mouth daily.    Marland Kitchen oxybutynin (DITROPAN) 5 MG tablet Take 5 mg by mouth 3 (three) times daily.    . potassium chloride SA (K-DUR,KLOR-CON) 20 MEQ tablet Take 20 mEq by mouth 2 (two) times daily.     . pramipexole (MIRAPEX) 1 MG tablet Take 4 mg by mouth at bedtime.     Marland Kitchen spironolactone (ALDACTONE) 25 MG tablet Take 25 mg by mouth 2 (two) times daily.     Marland Kitchen topiramate (TOPAMAX) 100 MG tablet Take 100 mg by mouth 2 (two) times daily.    Marland Kitchen albuterol  (PROVENTIL) (2.5 MG/3ML) 0.083% nebulizer solution Take 2.5 mg by nebulization every 6 (six) hours as needed for wheezing or shortness of breath.    . cyclobenzaprine (FLEXERIL) 10 MG tablet Take 10 mg by mouth 3 (three) times daily as needed for muscle spasms.     Marland Kitchen ipratropium-albuterol (DUONEB) 0.5-2.5 (3) MG/3ML SOLN Take 3 mLs by nebulization every 4 (four) hours as needed. (Patient not taking: Reported on 03/26/2018)    . loratadine-pseudoephedrine (CLARITIN-D 24-HOUR) 10-240 MG 24 hr tablet Take 1 tablet by mouth daily as needed for allergies.    Marland Kitchen traMADol (ULTRAM) 50 MG tablet Take 1 tablet (50 mg total) by mouth every 8 (eight) hours as needed. (Patient taking differently: Take 50 mg by mouth every 8 (eight) hours as needed for moderate pain or severe pain. ) 30 tablet 0  . zolpidem (AMBIEN) 10 MG tablet Take 5 mg by mouth at bedtime as needed for sleep.       Physical Exam: right shoulder with painful and restricted motion as noted at recent office visits  Vitals  Temp:  [98.2 F (36.8 C)] 98.2 F (36.8 C) (01/23 0820) Pulse Rate:  [79-90] 85 (01/23 0940) Resp:  [17-30] 25 (01/23 0940) BP: (127-152)/(69-83) 131/76 (01/23 0940) SpO2:  [99 %-100 %] 99 % (01/23 0940) Weight:  [112.9 kg] 112.9 kg (01/23 0839)  Assessment/Plan  Impression: right shoulder osteoarthritis   Plan of Action: Procedure(s): REVERSE TOTAL SHOULDER ARTHROPLASTY  Macio Kissoon M Ailea Rhatigan 04/10/2018, 9:50 AM Contact # [phone]

## 2018-04-10 NOTE — Progress Notes (Signed)
AssistedDr. Houser with right, ultrasound guided, interscalene  block. Side rails up, monitors on throughout procedure. See vital signs in flow sheet. Tolerated Procedure well.  

## 2018-04-10 NOTE — Op Note (Signed)
04/10/2018  12:19 PM  PATIENT:   Hannah Powell  73 y.o. female  PRE-OPERATIVE DIAGNOSIS:  right shoulder osteoarthritis  POST-OPERATIVE DIAGNOSIS: Same  PROCEDURE: Right shoulder reverse arthroplasty utilizing a press-fit size 8 Arthrex stem with a +9 spacer, +3 polyethylene insert, a 36/+4 glenosphere on a small/+2 baseplate  SURGEON:  Chelcy Bolda, Metta Clines M.D.  ASSISTANTS: Jenetta Loges, PA-C  ANESTHESIA:   General endotracheal and interscalene block with Exparel  EBL: 150 cc  SPECIMEN: None   Drains: None   PATIENT DISPOSITION:  PACU - hemodynamically stable.    PLAN OF CARE: Admit to inpatient   Brief history:  Ms. Labarre has been followed for chronic and progressively increasing right shoulder pain related to advanced arthritis.  Additionally, she has a number of significant underlying medical comorbidities.  Given her long-term lack of shoulder motion and significant bony deformity, it is felt that she would be an appropriate candidate for reverse shoulder arthroplasty  Preoperatively I had counseled Ms. Gieske regarding treatment options as well as the potential risks versus benefits thereof.  Possible surgical complications were reviewed including the potential for bleeding, infection, neurovascular injury, persistence of pain, loss of motion, anesthetic complication, failure of the implant, and possible need for additional surgery.  She understands and accepts and agrees with our planned procedure.  Procedure in detail:  After undergoing routine preop evaluation patient received prophylactic antibiotics and interscalene block with Exparel was established in the holding area by the anesthesia department.  Placed supine on the operating table and underwent the smooth induction of a general endotracheal anesthesia.  Placed into the beachchair position and appropriately padded and protected.  Right shoulder girdle region was sterilely prepped and draped in standard fashion.   Timeout was called.  An anterior deltopectoral approach to the right shoulder was made through a 10 cm incision.  Skin flaps elevated dissection deeply developing the deltopectoral interval from proximal to distal with the vein taken laterally.  Upper centimeter the pectoralis major was tenotomized for exposure.  Long head biceps tendon was then tenodesed at the upper border the pectoralis major and it was unroofed and excised proximally.  Subscapularis was divided away from the lesser tuberosity and tagged with suture tape sutures.  Capsular attachments on the anterior inferior margins of the humeral head and neck were then divided and a very large osteophyte was carefully dissected free the humeral head was delivered through the wound.  We outlined our proposed humeral head resection with the extra medullary guide this was then completed with an oscillating saw.  The peripheral osteophytes were removed with a rondure.  Metal cap placed to the cut proximal humeral surface and attention was then turned to the glenoid which was exposed with appropriate retractors and a circumferential labral resection was then performed gaining complete visualization of the margins of the glenoid.  A guidepin was then placed into the center of the glenoid with a 10 degree inferior tilt and this was then reamed with the central and the peripheral reamers.  Our central drill hole was then completed and then tapped and our baseplate was then assembled on the back table and inserted with a 25 mm lag screw with excellent fit and fixation.  The peripheral locking screws were all then placed.  He is all achieved excellent fixation.  R 36/+4 glenosphere was then impacted onto the baseplate and the central locking screw was inserted with excellent fixation.  We then returned our attention to the proximal humerus where the  canal was hand reamed up to size 7 and broached up to a size 8.  We used the posterior offset reaming post to pair the  metaphysis.  A trial was then placed and a trial reduction showed good soft tissue balance.  The trial was then removed we assembled our final implant and this was then impacted with excellent interference fit.  We then performed a series of trial reductions and felt that +12 off of the implant gave [place] the appropriate soft tissue balance good stability good motion.  At this point a +9 spacer was then inserted followed by +3 poly-and a final reduction showed excellent soft tissue balance good shoulder motion good stability.  At this point the wound scope was irrigated.  Hemostasis was obtained.  The subscapularis was then repaired back to the lesser tuberosity using the previously placed suture tapes with good fixation.  Shoulder showed 30 degrees of X rotation easily without excessive tension on the subscap repair.  Wound again was irrigated and hemostasis was obtained.  The deltopectoral interval was then reapproximated with a series of figure-of-eight #1 Vicryl sutures.  2-0 Vicryl used with subcu layer intracuticular 3 Monocryl for the skin followed by Dermabond and Aquasol dressing the right arm was then placed in a sling and the patient was awakened extubated and taken to the recovery room in stable condition.  Jenetta Loges, PA-C was used as an Environmental consultant throughout this case essential for help with positioning the patient, position extremity, tissue manipulation, implantation the prosthesis, wound closure, and Intra-Op decision-making.  Metta Clines Kyley Laurel MD   Contact # [phone]

## 2018-04-10 NOTE — Anesthesia Postprocedure Evaluation (Signed)
Anesthesia Post Note  Patient: Hannah Powell  Procedure(s) Performed: TOTAL REVERSE SHOULDER ARTHROPLASTY (Right Shoulder)     Patient location during evaluation: PACU Anesthesia Type: General Level of consciousness: awake and alert Pain management: pain level controlled Vital Signs Assessment: post-procedure vital signs reviewed and stable Respiratory status: spontaneous breathing, nonlabored ventilation, respiratory function stable and patient connected to nasal cannula oxygen Cardiovascular status: blood pressure returned to baseline and stable Postop Assessment: no apparent nausea or vomiting Anesthetic complications: no    Last Vitals:  Vitals:   04/10/18 1332 04/10/18 1433  BP: 135/76 140/84  Pulse: 69 68  Resp: 14 15  Temp: (!) 36.3 C (!) 36.3 C  SpO2: 100% 100%    Last Pain:  Vitals:   04/10/18 1433  TempSrc: Oral  PainSc:                  Barnet Glasgow

## 2018-04-10 NOTE — Anesthesia Procedure Notes (Signed)
Anesthesia Regional Block: Interscalene brachial plexus block   Pre-Anesthetic Checklist: ,, timeout performed, Correct Patient, Correct Site, Correct Laterality, Correct Procedure, Correct Position, site marked, Risks and benefits discussed,  Surgical consent,  Pre-op evaluation,  At surgeon's request and post-op pain management  Laterality: Upper and Right  Prep: Maximum Sterile Barrier Precautions used, chloraprep       Needles:  Injection technique: Single-shot  Needle Type: Echogenic Needle     Needle Length: 5cm  Needle Gauge: 21     Additional Needles:   Procedures:,,,, ultrasound used (permanent image in chart),,,,  Narrative:  Start time: 04/10/2018 9:16 AM End time: 04/10/2018 9:25 AM Injection made incrementally with aspirations every 5 mL.  Performed by: Personally  Anesthesiologist: Barnet Glasgow, MD  Additional Notes: Block assessed prior to procedure. Patient tolerated procedure well.

## 2018-04-10 NOTE — Discharge Instructions (Signed)
° °Kevin M. Supple, M.D., F.A.A.O.S. °Orthopaedic Surgery °Specializing in Arthroscopic and Reconstructive °Surgery of the Shoulder °[phone] °[address] - [place], [place] [address] - Fax [phone] ° ° °POST-OP TOTAL SHOULDER REPLACEMENT INSTRUCTIONS ° °1. Call the office at [phone] to schedule your first post-op appointment 10-14 days from the date of your surgery. ° °2. The bandage over your incision is waterproof. You may begin showering with this dressing on. You may leave this dressing on until first follow up appointment within 2 weeks. We prefer you leave this dressing in place until follow up however after 5-7 days if you are having itching or skin irritation and would like to remove it you may do so. Go slow and tug at the borders gently to break the bond the dressing has with the skin. At this point if there is no drainage it is okay to go without a bandage or you may cover it with a light guaze and tape. You can also expect significant bruising around your shoulder that will drift down your arm and into your chest wall. This is very normal and should resolve over several days. ° ° 3. Wear your sling/immobilizer at all times except to perform the exercises below or to occasionally let your arm dangle by your side to stretch your elbow. You also need to sleep in your sling immobilizer until instructed otherwise. It is ok to remove your sling if you are sitting in a controlled environment and allow your arm to rest in a position of comfort by your side or on your lap with pillows to give your neck and skin a break from the sling. You may remove it to allow arm to dangle by side to shower. If you are up walking around and when you go to sleep at night you need to wear it. ° °4. Range of motion to your elbow, wrist, and hand are encouraged 3-5 times daily. Exercise to your hand and fingers helps to reduce swelling you may experience. ° °5. Utilize ice to the shoulder 3-5 times  minimum a day and additionally if you are experiencing pain. ° °6. Prescriptions for a pain medication and a muscle relaxant are provided for you. It is recommended that if you are experiencing pain that you pain medication alone is not controlling, add the muscle relaxant along with the pain medication which can give additional pain relief. The first 1-2 days is generally the most severe of your pain and then should gradually decrease. As your pain lessens it is recommended that you decrease your use of the pain medications to an "as needed basis'" only and to always comply with the recommended dosages of the pain medications. ° °7. Pain medications can produce constipation along with their use. If you experience this, the use of an over the counter stool softener or laxative daily is recommended.  ° °8. For additional questions or concerns, please do not hesitate to call the office. If after hours there is an answering service to forward your concerns to the physician on call. ° °9.Pain control following an exparel block ° °To help control your post-operative pain you received a nerve block  performed with Exparel which is a long acting anesthetic (numbing agent) which can provide pain relief and sensations of numbness (and relief of pain) in the operative shoulder and arm for up to 3 days. Sometimes it provides mixed relief, meaning you may still have numbness in certain areas of the arm but can still   be able to move  parts of that arm, hand, and fingers. We recommend that your prescribed pain medications  be used as needed. We do not feel it is necessary to "pre medicate" and "stay ahead" of pain.  Taking narcotic pain medications when you are not having any pain can lead to unnecessary and potentially dangerous side effects.   ° °POST-OP EXERCISES ° °Pendulum Exercises ° °Perform pendulum exercises while standing and bending at the waist. Support your uninvolved arm on a table or chair and allow your operated  arm to hang freely. Make sure to do these exercises passively - not using you shoulder muscles. ° °Repeat 20 times. Do 3 sessions per day. ° ° ° ° °

## 2018-04-10 NOTE — Progress Notes (Signed)
Pt states that she will call when ready for CPAP, machine ready at bedside.  RT to monitor and assess as needed.

## 2018-04-10 NOTE — Anesthesia Procedure Notes (Signed)
Procedure Name: Intubation Date/Time: 04/10/2018 10:58 AM Performed by: Lavina Hamman, CRNA Pre-anesthesia Checklist: Patient identified, Emergency Drugs available, Suction available, Patient being monitored and Timeout performed Patient Re-evaluated:Patient Re-evaluated prior to induction Oxygen Delivery Method: Circle system utilized Preoxygenation: Pre-oxygenation with 100% oxygen Induction Type: IV induction Ventilation: Mask ventilation without difficulty Laryngoscope Size: Mac and 4 Grade View: Grade I Tube type: Oral Tube size: 7.0 mm Number of attempts: 1 Airway Equipment and Method: Stylet Placement Confirmation: ETT inserted through vocal cords under direct vision,  positive ETCO2,  CO2 detector and breath sounds checked- equal and bilateral Secured at: 22 cm Tube secured with: Tape Dental Injury: Teeth and Oropharynx as per pre-operative assessment

## 2018-04-10 NOTE — Transfer of Care (Signed)
Immediate Anesthesia Transfer of Care Note  Patient: Hannah Powell  Procedure(s) Performed: Procedure(s) with comments: TOTAL REVERSE SHOULDER ARTHROPLASTY (Right) - 115min  Patient Location: PACU  Anesthesia Type:General  Level of Consciousness:  sedated, patient cooperative and responds to stimulation  Airway & Oxygen Therapy:Patient Spontanous Breathing and Patient connected to face mask oxgen  Post-op Assessment:  Report given to PACU RN and Post -op Vital signs reviewed and stable  Post vital signs:  Reviewed and stable  Last Vitals:  Vitals:   04/10/18 1233 04/10/18 1234  BP: (!) 132/121 (!) 127/110  Pulse: (P) 76   Resp: 19   Temp: (!) 36.3 C   SpO2: (P) 450%     Complications: No apparent anesthesia complications

## 2018-04-11 MED ORDER — TRAMADOL HCL 50 MG PO TABS: 50.0000 mg | ORAL_TABLET | Freq: Three times a day (TID) | ORAL | 0 refills | Status: DC | PRN

## 2018-04-11 MED ORDER — HYDROCODONE-ACETAMINOPHEN 7.5-325 MG PO TABS: 1.0000 | ORAL_TABLET | Freq: Four times a day (QID) | ORAL | 0 refills | Status: DC | PRN

## 2018-04-11 NOTE — Evaluation (Signed)
Occupational Therapy Evaluation Patient Details Name: Hannah Powell MRN: [id number] DOB: [birth year] Today's Date: 04/11/2018    History of Present Illness s/p R RTSA   Clinical Impression   This 73 year old female was admitted for the above. At baseline, she is mod I.  Pt is R handed and block was limiting use at time of evaluation:  She was able to perform lap slides, move elbow to fingers.  She uses a RW at baseline, and she used it with only LUE and min A from OT to get into bathroom. Will follow in acute setting with the goals below.  She will benefit from continued therapy at SNF    Follow Up Recommendations  SNF    Equipment Recommendations  None recommended by OT(has [facility])    Recommendations for Other Services       Precautions / Restrictions Precautions Precautions: Shoulder;Fall Type of Shoulder Precautions: May be out of sling in controlled environment.  May use operated arm for ADLs within parameters of 20 ER, 45 ABD, 60 FF, P/AA/ROM (functionally, no exercises other than lap slides, gentle pendulums).  May move elbow, wrist and fingers.  No resisted IR Shoulder Interventions: Shoulder sling/immobilizer Precaution Booklet Issued: Yes (comment) Precaution Comments: PT USES A WALKER AT BASELINE.  USED RW WITH ONLY LUE Restrictions Weight Bearing Restrictions: Yes RUE Weight Bearing: Non weight bearing      Mobility Bed Mobility Overal bed mobility: Needs Assistance Bed Mobility: Supine to Sit     Supine to sit: Min guard     General bed mobility comments: HOB up and used rail  Transfers Overall transfer level: Needs assistance Equipment used: Rolling walker (2 wheeled) Transfers: Sit to/from Stand Sit to Stand: Min assist         General transfer comment: light assistance to stand    Balance                                           ADL either performed or assessed with clinical judgement   ADL Overall ADL's : Needs  assistance/impaired Eating/Feeding: Set up   Grooming: Oral care;Minimal assistance   Upper Body Bathing: Moderate assistance;Sitting   Lower Body Bathing: Maximal assistance;Sit to/from stand   Upper Body Dressing : Maximal assistance   Lower Body Dressing: Total assistance   Toilet Transfer: Minimal assistance;Ambulation;BSC;RW(using LUE only for RW)   Toileting- Clothing Manipulation and Hygiene: Maximal assistance;Total assistance;Sit to/from stand         General ADL Comments: ambulated to bathroom, performed ADL, and educated on precautions.  Pt not able to use RUE much for adls at this time due to block.     Vision         Perception     Praxis      Pertinent Vitals/Pain       Hand Dominance Right   Extremity/Trunk Assessment Upper Extremity Assessment Upper Extremity Assessment: RUE deficits/detail RUE Deficits / Details: block in effect.  able to do lap slides, move elbow, wrist and hand           Communication Communication Communication: No difficulties   Cognition Arousal/Alertness: Awake/alert Behavior During Therapy: WFL for tasks assessed/performed Overall Cognitive Status: Within Functional Limits for tasks assessed  General Comments       Exercises     Shoulder Instructions      Home Living Family/patient expects to be discharged to:: Skilled nursing facility Living Arrangements: Spouse/significant other                                      Prior Functioning/Environment Level of Independence: Independent with assistive device(s)(RW)        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 Problem List: Decreased strength;Decreased activity tolerance;Impaired balance (sitting and/or standing);Decreased knowledge of precautions;Pain;Impaired UE functional use      OT Treatment/Interventions: Self-care/ADL training;DME and/or AE instruction;Therapeutic  activities;Patient/family education;Balance training;Therapeutic exercise    OT Goals(Current goals can be found in the care plan section) Acute Rehab OT Goals Patient Stated Goal: good recovery OT Goal Formulation: With patient Time For Goal Achievement: 04/25/18 Potential to Achieve Goals: Good ADL Goals Pt Will Transfer to Toilet: with min guard assist;ambulating;bedside commode Additional ADL Goal #1: pt will perform gentle pendulums with min guard for safety Additional ADL Goal #2: pt will use RUE during ADL without cues within established parameters with min A for UB bathing/dressing  OT Frequency: Min 2X/week   Barriers to D/C:            Co-evaluation              AM-PAC OT "6 Clicks" Daily Activity     Outcome Measure Help from another person eating meals?: A Little Help from another person taking care of personal grooming?: A Little Help from another person toileting, which includes using toliet, bedpan, or urinal?: A Lot Help from another person bathing (including washing, rinsing, drying)?: A Lot Help from another person to put on and taking off regular upper body clothing?: A Lot Help from another person to put on and taking off regular lower body clothing?: Total 6 Click Score: 13   End of Session    Activity Tolerance: Patient tolerated treatment well Patient left: in chair;with call bell/phone within reach  OT Visit Diagnosis: Unsteadiness on feet (R26.81);Pain;Muscle weakness (generalized) (M62.81) Pain - Right/Left: Right Pain - part of body: Shoulder                Time: 7673-4193 OT Time Calculation (min): 45 min Charges:  OT General Charges $OT Visit: 1 Visit OT Evaluation $OT Eval Moderate Complexity: 1 Mod OT Treatments $Self Care/Home Management : 8-22 mins $Therapeutic Exercise: 8-22 mins  Lesle Chris, OTR/L Acute Rehabilitation Services [phone] [facility] pager [phone] office 04/11/2018  Page 04/11/2018, 10:29 AM

## 2018-04-11 NOTE — NC FL2 (Addendum)
[place] LEVEL OF CARE SCREENING TOOL     IDENTIFICATION  Patient Name: Hannah Powell Birthdate: 07-25-1945 Sex: female Admission Date (Current Location): 04/10/2018  [facility] and [place] Number:  Herbalist and Address:  [facility],  [place] [address] [place]      Provider Number: [id number]  Attending Physician Name and Address:  Justice Britain, MD  Relative Name and Phone Number:       Current Level of Care: Hospital Recommended Level of Care: [place] Prior Approval Number:    Date Approved/Denied:   PASRR Number:   [id number]  Discharge Plan: SNF    Current Diagnoses: Patient Active Problem List   Diagnosis Date Noted  . S/P reverse total shoulder arthroplasty, 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  . Chronic diastolic heart failure ([place]) 08/09/2016  . COPD (chronic obstructive pulmonary disease) ([place]) 08/09/2016  . Pulmonary infiltrates   . Pneumonia due to Streptococcus pneumoniae ([place])   . Fever   . Community acquired pneumonia of right lung   . Palliative care by specialist   . Goals of care, counseling/discussion   . Pressure injury of skin 07/03/2016  . COPD exacerbation ([place]) 07/21/2015  . Acute renal insufficiency 07/21/2015  . Hyperglycemia 07/21/2015  . Benign essential HTN 07/21/2015  . S/P shoulder replacement 07/02/2013    Orientation RESPIRATION BLADDER Height & Weight     Self, Time, Situation, Place  Nasal Cannula 2L Continent Weight: 249 lb (112.9 kg) Height:  5\' 7"  (170.2 cm)  BEHAVIORAL SYMPTOMS/MOOD NEUROLOGICAL BOWEL NUTRITION STATUS      Continent Diet(Regular )  AMBULATORY STATUS COMMUNICATION OF NEEDS Skin   Extensive Assist Verbally Surgical wounds                       Personal Care Assistance Level of Assistance  Bathing, Feeding, Dressing Bathing Assistance: Maximum assistance Feeding assistance: Limited assistance Dressing Assistance: Maximum assistance      Functional Limitations Info  Sight, Hearing, Speech Sight Info: Adequate Hearing Info: Adequate Speech Info: Adequate    SPECIAL CARE FACTORS FREQUENCY  PT (By licensed PT), OT (By licensed OT)     PT Frequency: 5x/week OT Frequency: 5x/week             Contractures Contractures Info: Not present    Additional Factors Info  Code Status, Allergies, Psychotropic Code Status Info: Fullcode  Allergies Info: Allergies: Lyrica Pregabalin           Current Medications (04/11/2018):  This is the current hospital active medication list Current Facility-Administered Medications  Medication Dose Route Frequency Provider Last Rate Last Dose  . acetaminophen (TYLENOL) tablet 325-650 mg  325-650 mg Oral Q6H PRN Shuford, Tracy, PA-C      . albuterol (PROVENTIL) (2.5 MG/3ML) 0.083% nebulizer solution 2.5 mg  2.5 mg Nebulization Q6H PRN Shuford, Olivia Mackie, PA-C      . ALPRAZolam Duanne Moron) tablet 0.25 mg  0.25 mg Oral TID PRN Shuford, Tracy, PA-C   0.25 mg at 04/11/18 0013  . alum & mag hydroxide-simeth (MAALOX/MYLANTA) 200-200-20 MG/5ML suspension 30 mL  30 mL Oral Q4H PRN Shuford, Tracy, PA-C      . azithromycin (ZITHROMAX) tablet 250 mg  250 mg Oral Daily Shuford, Tracy, PA-C   250 mg at 04/11/18 0950  . bisacodyl (DULCOLAX) EC tablet 5 mg  5 mg Oral Daily PRN Shuford, Tracy, PA-C      . cyclobenzaprine (FLEXERIL) tablet 10 mg  10 mg  Oral TID PRN Shuford, Olivia Mackie, PA-C   10 mg at 04/11/18 0541  . diphenhydrAMINE (BENADRYL) 12.5 MG/5ML elixir 12.5-25 mg  12.5-25 mg Oral Q4H PRN Shuford, Tracy, PA-C      . docusate sodium (COLACE) capsule 100 mg  100 mg Oral BID Shuford, Tracy, PA-C   100 mg at 04/11/18 0400  . fluticasone furoate-vilanterol (BREO ELLIPTA) 200-25 MCG/INH 1 puff  1 puff Inhalation Daily Shuford, Tracy, PA-C   1 puff at 04/11/18 0824  . furosemide (LASIX) tablet 40 mg  40 mg Oral BID Shuford, Tracy, PA-C   40 mg at 04/11/18 0853  . gabapentin (NEURONTIN) capsule 300 mg  300 mg Oral  QHS Shuford, Tracy, PA-C   300 mg at 04/10/18 2237  . HYDROmorphone (DILAUDID) injection 0.5-1 mg  0.5-1 mg Intravenous Q4H PRN Shuford, Tracy, PA-C      . lactated ringers infusion   Intravenous Continuous Shuford, Tracy, PA-C 75 mL/hr at 04/10/18 1300    . levothyroxine (SYNTHROID, LEVOTHROID) tablet 200 mcg  200 mcg Oral QAC breakfast Justice Britain, MD   200 mcg at 04/11/18 0543  . losartan (COZAAR) tablet 25 mg  25 mg Oral Daily Shuford, Tracy, PA-C   25 mg at 04/11/18 0951  . magnesium citrate solution 1 Bottle  1 Bottle Oral Once PRN Shuford, Olivia Mackie, PA-C      . menthol-cetylpyridinium (CEPACOL) lozenge 3 mg  1 lozenge Oral PRN Shuford, Olivia Mackie, PA-C       Or  . phenol (CHLORASEPTIC) mouth spray 1 spray  1 spray Mouth/Throat PRN Shuford, Olivia Mackie, PA-C      . metoCLOPramide (REGLAN) tablet 5-10 mg  5-10 mg Oral Q8H PRN Shuford, Tracy, PA-C       Or  . metoCLOPramide (REGLAN) injection 5-10 mg  5-10 mg Intravenous Q8H PRN Shuford, Tracy, PA-C      . ondansetron (ZOFRAN) tablet 4 mg  4 mg Oral Q6H PRN Shuford, Tracy, PA-C       Or  . ondansetron (ZOFRAN) injection 4 mg  4 mg Intravenous Q6H PRN Shuford, Tracy, PA-C      . oxybutynin (DITROPAN) tablet 5 mg  5 mg Oral TID Shuford, Tracy, PA-C   5 mg at 04/11/18 9528  . oxyCODONE (Oxy IR/ROXICODONE) immediate release tablet 10 mg  10 mg Oral Q4H PRN Shuford, Tracy, PA-C   10 mg at 04/11/18 0541  . oxyCODONE (Oxy IR/ROXICODONE) immediate release tablet 5 mg  5 mg Oral Q4H PRN Shuford, Tracy, PA-C      . polyethylene glycol (MIRALAX / GLYCOLAX) packet 17 g  17 g Oral Daily PRN Shuford, Tracy, PA-C      . pramipexole (MIRAPEX) tablet 4 mg  4 mg Oral QHS Shuford, Tracy, PA-C   4 mg at 04/10/18 2236  . spironolactone (ALDACTONE) tablet 25 mg  25 mg Oral BID Shuford, Tracy, PA-C   25 mg at 04/11/18 0853  . topiramate (TOPAMAX) tablet 100 mg  100 mg Oral BID Shuford, Tracy, PA-C   100 mg at 04/11/18 0949  . traMADol (ULTRAM) tablet 50 mg  50 mg Oral Q6H  PRN Shuford, Tracy, PA-C      . zolpidem (AMBIEN) tablet 5 mg  5 mg Oral QHS PRN Shuford, Olivia Mackie, PA-C         Discharge Medications: Please see discharge summary for a list of discharge medications.  Relevant Imaging Results:  Relevant Lab Results:   Additional Information ss: [id number]  Lia Hopping, LCSW

## 2018-04-11 NOTE — Clinical Social Work Note (Signed)
Clinical Social Work Assessment  Patient Details  Name: Hannah Powell MRN: [id number] Date of Birth: October 30, 1945  Date of referral:  04/11/18               Reason for consult:  Discharge Planning                Permission sought to share information with:    Permission granted to share information::  Yes, Verbal Permission Granted  Name::       Mcgillivray,Richard  Agency::  SNF  Relationship::  Spouse   Contact Information:    [phone]  Housing/Transportation Living arrangements for the past 2 months:  Single Family Home Source of Information:  Patient Patient Interpreter Needed:  None Criminal Activity/Legal Involvement Pertinent to Current Situation/Hospitalization:  No - Comment as needed Significant Relationships:  Spouse, Other Family Members, Friend Lives with:  Spouse Do you feel safe going back to the place where you live?  Yes Need for family participation in patient care: Yes (temporaily dependent with mobility)  Care giving concerns:   Patient admitted with end stage right shoulder OA Physician/ PT recommend the patient follow up at SNF for short rehab.   Social Worker assessment / plan:  CSW discussed discharge planning with patient, spouse and other family members at bedside. Patient reports prior to admitting for her surgery she visited SNF[facility] and prefers to go there for rehab.  Per patient, she is independent with ADL's. She reports she uses a walker to ambulate, but since having the surgery she cannot use her shoulders. The patient is hoping to learn how to use a cane to ambulate in rehab.   CSW explain SNF process and sent clinicals. CSW confirm the facility will accept the patient. Facility started the [employer] process.   FL2 completed.  PASRR completed.   Plan: [facility] SN[facility]  Barriers to discharge: Insurance approval.   Employment status:  Retired Nurse, adult PT Recommendations:  [place] / Referral to community resources:  [place]  Patient/Family's Response to care:  Agreeable and Responding well to care.   Patient/Family's Understanding of and Emotional Response to Diagnosis, Current Treatment, and Prognosis:  Patient has a good understanding of her diagnosis and follow up treatment at rehab.   Emotional Assessment Appearance:  Appears stated age Attitude/Demeanor/Rapport:    Affect (typically observed):  Accepting, Pleasant Orientation:  Oriented to Self, Oriented to Place, Oriented to  Time, Oriented to Situation Alcohol / Substance use:  Not Applicable Psych involvement (Current and /or in the community):  No (Comment)  Discharge Needs  Concerns to be addressed:  Discharge Planning Concerns Readmission within the last 30 days:  No Current discharge risk:  Dependent with Mobility, Physical Impairment Barriers to Discharge:  Continued Medical Work up, [place], LCSW 04/11/2018, 1:14 PM

## 2018-04-11 NOTE — Progress Notes (Signed)
Patient chose [facility] for rehab.  CSW confirm placement at facility. SNF started the insurance process. Barriers to discharge: Ship broker.   Kathrin Greathouse, Marlinda Mike, MSW Clinical Social Worker  [phone] 04/11/2018  12:06 PM

## 2018-04-11 NOTE — Discharge Summary (Addendum)
PATIENT ID:      Hannah Powell  MRN:     [id number] DOB/AGE:    Jul 31, 1945 / 73 y.o.     DISCHARGE SUMMARY  ADMISSION DATE:    04/10/2018 DISCHARGE DATE:  04/15/2018  ADMISSION DIAGNOSIS: right shoulder osteoarthritis Past Medical History:  Diagnosis Date  . Anxiety   . Arthritis   . CHF (congestive heart failure) ([place])   . COPD (chronic obstructive pulmonary disease) ([place])   . Edema    feet/legs  . Hypertension    dr Otho Najjar     . Hypothyroidism   . Leg weakness, bilateral   . Osteoporosis   . Oxygen deficit    2l with bipap at night  . RLS (restless legs syndrome)   . Shortness of breath   . Sleep apnea    bipap  . Wheezing     DISCHARGE DIAGNOSIS:   Active Problems:   S/P reverse total shoulder arthroplasty, right   PROCEDURE: Procedure(s): TOTAL REVERSE SHOULDER ARTHROPLASTY on 04/10/2018  CONSULTS:    HISTORY:  See H&P in chart.  HOSPITAL COURSE:  Hannah Powell is a 73 y.o. admitted on 04/10/2018 with a diagnosis of right shoulder osteoarthritis.  They were brought to the operating room on 04/10/2018 and underwent Procedure(s): TOTAL REVERSE SHOULDER ARTHROPLASTY.    They were given perioperative antibiotics:  Anti-infectives (From admission, onward)   Start     Dose/Rate Route Frequency Ordered Stop   04/10/18 1500  azithromycin (ZITHROMAX) tablet 250 mg     250 mg Oral Daily 04/10/18 1337     04/10/18 0815  ceFAZolin (ANCEF) IVPB 2g/100 mL premix     2 g 200 mL/hr over 30 Minutes Intravenous On call to O.R. 04/10/18 0802 04/10/18 1053   04/10/18 0815  vancomycin (VANCOCIN) 1,500 mg in sodium chloride 0.9 % 500 mL IVPB     1,500 mg 250 mL/hr over 120 Minutes Intravenous On call to O.R. 04/10/18 0802 04/10/18 1116    .  Patient underwent the above named procedure and tolerated it well. The following day they were hemodynamically stable and pain was controlled on oral analgesics. They were neurovascularly intact to the operative extremity. OT was  ordered and worked with patient per protocol. They were medically and orthopaedically stable for discharge on day 04/14/2018 . Insurance approval finally happened for discharge on 04/15/18  She was kept the additional days for therapy and awaiting insurance approvals for skilled care. She remained medically and orthopaedically stable.    DIAGNOSTIC STUDIES:  RECENT RADIOGRAPHIC STUDIES :  No results found.  RECENT VITAL SIGNS:   Patient Vitals for the past 24 hrs:  BP Temp Temp src Pulse Resp SpO2  04/11/18 0824 - - - - - 94 %  04/11/18 0455 (!) 119/47 97.7 F (36.5 C) Oral 80 20 92 %  04/11/18 0134 (!) 94/48 98.9 F (37.2 C) Oral 81 14 98 %  04/10/18 2110 (!) 106/36 98.5 F (36.9 C) Oral 86 18 99 %  04/10/18 1648 (!) 116/57 97.7 F (36.5 C) - 89 17 99 %  04/10/18 1539 - - - - - 95 %  04/10/18 1535 131/72 (!) 97.4 F (36.3 C) Oral 82 17 100 %  04/10/18 1433 140/84 (!) 97.3 F (36.3 C) Oral 68 15 100 %  04/10/18 1332 135/76 (!) 97.4 F (36.3 C) Oral 69 14 100 %  04/10/18 1300 133/80 97.7 F (36.5 C) - 70 19 100 %  04/10/18 1245 131/78 - -  75 13 100 %  04/10/18 1234 (!) 127/110 - - - - -  04/10/18 1233 (!) 132/121 (!) 97.4 F (36.3 C) - 76 19 100 %  04/10/18 1000 136/66 - - 78 (!) 21 100 %  04/10/18 0945 (!) 143/73 - - 86 (!) 25 100 %  04/10/18 0940 131/76 - - 85 (!) 25 99 %  04/10/18 0935 137/73 - - 90 (!) 30 99 %  04/10/18 0930 133/78 - - 82 17 100 %  04/10/18 0926 127/77 - - 81 (!) 22 100 %  04/10/18 0920 137/69 - - 84 20 100 %  04/10/18 0915 132/76 - - 79 19 99 %  04/10/18 0914 - - - 88 17 100 %  04/10/18 0913 - - - 82 19 100 %  04/10/18 0912 - - - 86 (!) 21 100 %  04/10/18 0911 - - - 88 (!) 22 100 %  04/10/18 0910 (!) 146/73 - - 86 18 100 %  04/10/18 0909 - - - 84 20 100 %  04/10/18 0908 - - - 84 20 100 %  04/10/18 0907 - - - 84 19 100 %  04/10/18 0906 - - - 85 20 100 %  04/10/18 0905 (!) 152/83 - - 82 19 100 %  .  RECENT EKG RESULTS:    Orders placed or  performed during the hospital encounter of 04/04/18  . EKG 12 lead  . EKG 12 lead    DISCHARGE INSTRUCTIONS:    DISCHARGE MEDICATIONS:   Allergies as of 04/11/2018      Reactions   Lyrica [pregabalin] Other (See Comments)   Reaction: made Restless Leg Syndrome worse       Medication List    TAKE these medications   albuterol (2.5 MG/3ML) 0.083% nebulizer solution Commonly known as:  PROVENTIL Take 2.5 mg by nebulization every 6 (six) hours as needed for wheezing or shortness of breath.   ALPRAZolam 0.25 MG tablet Commonly known as:  XANAX Take 1 tablet (0.25 mg total) by mouth 3 (three) times daily as needed for anxiety.   azithromycin 250 MG tablet Commonly known as:  ZITHROMAX Take 250 mg by mouth daily.   B-complex with vitamin C tablet Take 1 tablet by mouth daily.   calcium-vitamin D 500-200 MG-UNIT tablet Take 1 tablet by mouth daily.   cyclobenzaprine 10 MG tablet Commonly known as:  FLEXERIL Take 10 mg by mouth 3 (three) times daily as needed for muscle spasms.   ferrous sulfate 325 (65 FE) MG tablet Take 325 mg by mouth daily with breakfast.   fluticasone furoate-vilanterol 200-25 MCG/INH Aepb Commonly known as:  BREO ELLIPTA Inhale 1 puff into the lungs daily.   furosemide 20 MG tablet Commonly known as:  LASIX Take 2 tablets (40 mg total) by mouth 2 (two) times daily. What changed:    how much to take  when to take this  additional instructions   gabapentin 300 MG capsule Commonly known as:  NEURONTIN Take 300 mg by mouth at bedtime.   HYDROcodone-acetaminophen 7.5-325 MG tablet Commonly known as:  NORCO Take 1 tablet by mouth every 6 (six) hours as needed for moderate pain. What changed:  when to take this   ibuprofen 200 MG tablet Commonly known as:  ADVIL,MOTRIN Take 800 mg by mouth every 6 (six) hours as needed for headache or mild pain.   ipratropium-albuterol 0.5-2.5 (3) MG/3ML Soln Commonly known as:  DUONEB Take 3 mLs by  nebulization every 4 (four)  hours as needed.   levothyroxine 200 MCG tablet Commonly known as:  SYNTHROID, LEVOTHROID Take 200 mcg by mouth daily before breakfast.   loratadine-pseudoephedrine 10-240 MG 24 hr tablet Commonly known as:  CLARITIN-D 24-hour Take 1 tablet by mouth daily as needed for allergies.   losartan 25 MG tablet Commonly known as:  COZAAR Take 25 mg by mouth daily.   MULTIVITAMIN WOMEN PO Take 1 tablet by mouth daily.   OMEGA 3 PO Take 1 capsule by mouth daily.   oxybutynin 5 MG tablet Commonly known as:  DITROPAN Take 5 mg by mouth 3 (three) times daily.   potassium chloride SA 20 MEQ tablet Commonly known as:  K-DUR,KLOR-CON Take 20 mEq by mouth 2 (two) times daily.   pramipexole 1 MG tablet Commonly known as:  MIRAPEX Take 4 mg by mouth at bedtime.   spironolactone 25 MG tablet Commonly known as:  ALDACTONE Take 25 mg by mouth 2 (two) times daily.   topiramate 100 MG tablet Commonly known as:  TOPAMAX Take 100 mg by mouth 2 (two) times daily.   traMADol 50 MG tablet Commonly known as:  ULTRAM Take 1 tablet (50 mg total) by mouth every 8 (eight) hours as needed (for mild pain). What changed:  reasons to take this   zolpidem 10 MG tablet Commonly known as:  AMBIEN Take 5 mg by mouth at bedtime as needed for sleep.       FOLLOW UP VISIT:   Follow-up Information    Justice Britain, MD.   Specialty:  Orthopedic Surgery Why:  call to be seen in 10-14 days Contact information: [address] [place] [address] [phone]           OT consult  If sitting in controlled environment, ok to come out of sling to give neck a break. Please sleep in it to protect until follow up in office.  OK to use operative arm for feeding, hygiene and ADLs. Ok to instruct Pendulums and lap slides as exercises. Ok to use operative arm within the following parameters for ADL purposes   Ok for PROM, AAROM, AROM within pain tolerance  and within the following ROM ER 20 ABD 45 FE 60   DISCHARGE TO: Skilled   DISCHARGE CONDITION:  [place] for Dr. Lennette Bihari Supple 04/11/2018, 9:00 AM

## 2018-04-11 NOTE — Plan of Care (Signed)
  Problem: Pain Management: Goal: Pain level will decrease with appropriate interventions Outcome: Progressing   Problem: Activity: Goal: Ability to tolerate increased activity will improve Outcome: Progressing   Problem: Clinical Measurements: Goal: Respiratory complications will improve Outcome: Progressing   Problem: Clinical Measurements: Goal: Cardiovascular complication will be avoided Outcome: Progressing

## 2018-04-12 NOTE — Progress Notes (Signed)
Occupational Therapy Treatment Patient Details Name: Hannah Powell MRN: [id number] DOB: Aug 13, 1945 Today's Date: 04/12/2018    History of present illness s/p R RTSA   OT comments  Pt very willing to to work with OT   Follow Up Recommendations  SNF    Equipment Recommendations  None recommended by OT(has [facility])       Precautions / Restrictions Precautions Precautions: Shoulder;Fall Type of Shoulder Precautions: May be out of sling in controlled environment.  May use operated arm for ADLs within parameters of 20 ER, 45 ABD, 60 FF, P/AA/ROM (functionally, no exercises other than lap slides, gentle pendulums).  May move elbow, wrist and fingers.  No resisted IR Shoulder Interventions: Shoulder sling/immobilizer Precaution Booklet Issued: Yes (comment) Precaution Comments: PT USES A WALKER AT BASELINE.  USED RW WITH ONLY LUE Restrictions RUE Weight Bearing: Non weight bearing       Mobility Bed Mobility Overal bed mobility: Needs Assistance Bed Mobility: Supine to Sit     Supine to sit: Min guard     General bed mobility comments: HOB up and used rail  Transfers Overall transfer level: Needs assistance Equipment used: Rolling walker (2 wheeled) Transfers: Sit to/from Stand Sit to Stand: Min assist         General transfer comment: light assistance to stand        ADL either performed or assessed with clinical judgement   ADL                    Pt overall min- mod A with toileting with CNA prior to OT session                                      Cognition Arousal/Alertness: Awake/alert Behavior During Therapy: WFL for tasks assessed/performed Overall Cognitive Status: Within Functional Limits for tasks assessed                                          Exercises  Pt  able to do lap slides, move elbow, wrist and hand.  Pt reported increased pain with movement        Frequency  Min 2X/week        Progress  Toward Goals  OT Goals(current goals can now be found in the care plan section)  Progress towards OT goals: Progressing toward goals     Plan Discharge plan remains appropriate       AM-PAC OT "6 Clicks" Daily Activity     Outcome Measure   Help from another person eating meals?: A Little Help from another person taking care of personal grooming?: A Little Help from another person toileting, which includes using toliet, bedpan, or urinal?: A Lot Help from another person bathing (including washing, rinsing, drying)?: A Lot Help from another person to put on and taking off regular upper body clothing?: A Lot Help from another person to put on and taking off regular lower body clothing?: Total 6 Click Score: 13    End of Session    OT Visit Diagnosis: Unsteadiness on feet (R26.81);Pain;Muscle weakness (generalized) (M62.81) Pain - Right/Left: Right Pain - part of body: Shoulder   Activity Tolerance Patient tolerated treatment well   Patient Left in chair;with call bell/phone within reach   Nurse Communication Mobility status  Time: 7373-6681 OT Time Calculation (min): 10 min  Charges: OT General Charges $OT Visit: 1 Visit OT Treatments $Therapeutic Exercise: 8-22 mins  Kari Baars, [place] [phone] Office- [phone]      Laylah Riga, Edwena Felty D 04/12/2018, 1:01 PM

## 2018-04-12 NOTE — Progress Notes (Signed)
   Subjective: 2 Days Post-Op Procedure(s) (LRB): TOTAL REVERSE SHOULDER ARTHROPLASTY (Right)  C/o mild to moderate soreness to right shoulder Concerned about her lungs due to previous ventilator placement last year Denies any  Numbness or tingling distally Patient reports pain as moderate.  Objective:   VITALS:   Vitals:   04/11/18 2225 04/12/18 0526  BP:  122/63  Pulse:  86  Resp: 18   Temp:  99.1 F (37.3 C)  SpO2:  100%    Right shoulder dressing intact nv intact distally Able to extend and move arm/elbow well Lungs: no wheezes rhonchi or rales, good breath sounds bilaterally  LABS No results for input(s): HGB, HCT, WBC, PLT in the last 72 hours.  No results for input(s): NA, K, BUN, CREATININE, GLUCOSE in the last 72 hours.   Assessment/Plan: 2 Days Post-Op Procedure(s) (LRB): TOTAL REVERSE SHOULDER ARTHROPLASTY (Right) Plan for SNF placement on Monday Continue gentle PT/OT Will continue to monitor her progress    Merla Riches PA-C, [place] [facility] now [employer] [facility] [address], [place], [place], [place] [address] Phone: [phone] [email or web address] Facebook  [employer]

## 2018-04-12 NOTE — Plan of Care (Signed)
  Problem: Activity: Goal: Ability to tolerate increased activity will improve Outcome: Progressing   Problem: Pain Management: Goal: Pain level will decrease with appropriate interventions Outcome: Progressing   Problem: Health Behavior/Discharge Planning: Goal: Ability to manage health-related needs will improve Outcome: Progressing   Problem: Clinical Measurements: Goal: Will remain free from infection Outcome: Progressing Goal: Respiratory complications will improve Outcome: Progressing Goal: Cardiovascular complication will be avoided Outcome: Progressing

## 2018-04-13 NOTE — Progress Notes (Signed)
Subjective: 3 Days Post-Op Procedure(s) (LRB): TOTAL REVERSE SHOULDER ARTHROPLASTY (Right) Patient reports pain as 3 on 0-10 scale.    Objective: Vital signs in last 24 hours: Temp:  [97.9 F (36.6 C)-100.1 F (37.8 C)] 99 F (37.2 C) (01/26 0556) Pulse Rate:  [79-93] 79 (01/26 0556) Resp:  [16-18] 16 (01/26 0556) BP: (114-138)/(39-67) 136/67 (01/26 0556) SpO2:  [92 %-100 %] 100 % (01/26 0556)  Intake/Output from previous day: 01/25 0701 - 01/26 0700 In: 1440 [P.O.:1440] Out: 500 [Urine:500] Intake/Output this shift: No intake/output data recorded.  No results for input(s): HGB in the last 72 hours. No results for input(s): WBC, RBC, HCT, PLT in the last 72 hours. No results for input(s): NA, K, CL, CO2, BUN, CREATININE, GLUCOSE, CALCIUM in the last 72 hours. No results for input(s): LABPT, INR in the last 72 hours.  Neurologically intact Neurovascular intact Sensation intact distally Dorsiflexion/Plantar flexion intact Incision: dressing C/D/I  No DVT  Assessment/Plan: 3 Days Post-Op Procedure(s) (LRB): TOTAL REVERSE SHOULDER ARTHROPLASTY (Right) Advance diet Up with therapy D/C IV fluids Plan for discharge tomorrow    Hannah Powell 04/13/2018, 8:19 AM

## 2018-04-13 NOTE — Progress Notes (Signed)
Occupational Therapy Treatment Patient Details Name: Hannah Powell MRN: [id number] DOB: 11-Feb-1946 Today's Date: 04/13/2018    History of present illness s/p R RTSA   OT comments  Pt sitting in chair and able to perform pendulum exercises this day as well as lap slides  Follow Up Recommendations  SNF    Equipment Recommendations  None recommended by OT(has [facility])    Recommendations for Other Services      Precautions / Restrictions Precautions Precautions: Shoulder;Fall Type of Shoulder Precautions: May be out of sling in controlled environment.  May use operated arm for ADLs within parameters of 20 ER, 45 ABD, 60 FF, P/AA/ROM (functionally, no exercises other than lap slides, gentle pendulums).  May move elbow, wrist and fingers.  No resisted IR Shoulder Interventions: Shoulder sling/immobilizer Precaution Booklet Issued: Yes (comment) Precaution Comments: PT USES A WALKER AT BASELINE.  USED RW WITH ONLY LUE Restrictions RUE Weight Bearing: Non weight bearing       Mobility Bed Mobility               General bed mobility comments: pt in chair  Transfers Overall transfer level: Needs assistance Equipment used: 1 person hand held assist Transfers: Sit to/from Stand;Stand Pivot Transfers Sit to Stand: Min assist Stand pivot transfers: Min assist       General transfer comment: light assistance to stand        ADL either performed or assessed with clinical judgement   ADL Overall ADL's : Needs assistance/impaired     Grooming: Oral care;Minimal assistance;Sitting                   Toilet Transfer: Minimal assistance;Ambulation;BSC;RW;Independent   Toileting- Clothing Manipulation and Hygiene: Minimal assistance;Sit to/from stand;Cueing for sequencing;Cueing for safety         General ADL Comments: Pt did well with pendulums exercise this day with RUE in sitting position as well as elbow flexion and extension, wrist flexion/extension as well  as fingers.  No edema noted.   Pt also able to perform lap slides.      Vision Patient Visual Report: No change from baseline            Cognition Arousal/Alertness: Awake/alert Behavior During Therapy: WFL for tasks assessed/performed Overall Cognitive Status: Within Functional Limits for tasks assessed                                                     Pertinent Vitals/ Pain       Pain Score: 4  Pain Location: R shoulder with pendulum Pain Descriptors / Indicators: Sore Pain Intervention(s): Limited activity within patient's tolerance;Repositioned;Monitored during session;Patient requesting pain meds-RN notified         Frequency  Min 2X/week        Progress Toward Goals  OT Goals(current goals can now be found in the care plan section)  Progress towards OT goals: Progressing toward goals  Acute Rehab OT Goals Patient Stated Goal: good recovery  Plan Discharge plan remains appropriate       AM-PAC OT "6 Clicks" Daily Activity     Outcome Measure   Help from another person eating meals?: A Little Help from another person taking care of personal grooming?: A Little Help from another person toileting, which includes using toliet, bedpan, or urinal?: A Little Help from  another person bathing (including washing, rinsing, drying)?: A Lot Help from another person to put on and taking off regular upper body clothing?: A Little Help from another person to put on and taking off regular lower body clothing?: A Lot 6 Click Score: 16    End of Session    OT Visit Diagnosis: Unsteadiness on feet (R26.81);Pain;Muscle weakness (generalized) (M62.81) Pain - Right/Left: Right Pain - part of body: Shoulder   Activity Tolerance Patient tolerated treatment well   Patient Left in chair;with call bell/phone within reach   Nurse Communication Mobility status        Time: 4481-8563 OT Time Calculation (min): 13 min  Charges: OT General  Charges $OT Visit: 1 Visit OT Treatments $Self Care/Home Management : 8-22 mins  Kari Baars, [place] [phone] Office- [phone]      Manuela Halbur, Edwena Felty D 04/13/2018, 2:09 PM

## 2018-04-14 NOTE — Care Management Important Message (Signed)
Important Message  Patient Details  Name: Hannah Powell MRN: [id number] Date of Birth: 01-10-1946   Medicare Important Message Given:  Yes    Kerin Salen 04/14/2018, 1:37 [place] Message  Patient Details  Name: Hannah Powell MRN: [id number] Date of Birth: 1946/03/07   Medicare Important Message Given:  Yes    Kerin Salen 04/14/2018, 1:37 PM

## 2018-04-14 NOTE — Evaluation (Signed)
Physical Therapy Evaluation Patient Details Name: Ryder Chesmore MRN: [id number] DOB: [birth year] Today's Date: 04/14/2018   History of Present Illness  73 yo female s/p R Reverse TSA.  Clinical Impression  On eval, pt required Mod assist for mobility. She was able to perform a stand pivot and ambulate ~10 feet with use of a RW (pt holding on to L side only while therapist managed R side). Pt presents with post-op weakness, decreased activity tolerance, and impaired gait and balance. Recommend ST rehab at SNF to improve strength/balance, improve safety, and to regain independence prior to returning home. Will follow during hospital stay.     Follow Up Recommendations SNF    Equipment Recommendations  None recommended by PT    Recommendations for Other Services       Precautions / Restrictions Precautions Precautions: Shoulder;Fall Type of Shoulder Precautions: May be out of sling in controlled environment.  May use operated arm for ADLs within parameters of 20 ER, 45 ABD, 60 FF, P/AA/ROM (functionally, no exercises other than lap slides, gentle pendulums).  May move elbow, wrist and fingers.  No resisted IR Shoulder Interventions: Shoulder sling/immobilizer Precaution Comments: PT USES A WALKER AT BASELINE.  USED RW WITH ONLY LUE Restrictions Weight Bearing Restrictions: Yes RUE Weight Bearing: Non weight bearing      Mobility  Bed Mobility Overal bed mobility: Needs Assistance Bed Mobility: Sit to Supine     Supine to sit: Mod assist Sit to supine: Mod assist;HOB elevated   General bed mobility comments: Assist for trunk and bil LEs. Increased time.   Transfers Overall transfer level: Needs assistance Equipment used: Rolling walker (2 wheeled) Transfers: Sit to/from Stand Sit to Stand: Mod assist Stand pivot transfers: Min assist       General transfer comment: Assist to rise, stabilize, control descent. Stand pivot, recliner to bsc, with pt using one side of RW  while therapist managed R side of walker.   Ambulation/Gait Ambulation/Gait assistance: Mod assist Gait Distance (Feet): 10 Feet Assistive device: Rolling walker (2 wheeled) Gait Pattern/deviations: Step-through pattern;Decreased stride length     General Gait Details: Assist to stabilize pt and manevuer safely with RW. Pt was able to walk a short distance around room. Distance limited by fatigue. Dyspnea 2/4  Stairs            Wheelchair Mobility    Modified Rankin (Stroke Patients Only)       Balance Overall balance assessment: Needs assistance;History of Falls Sitting-balance support: Single extremity supported Sitting balance-Leahy Scale: Good     Standing balance support: Bilateral upper extremity supported Standing balance-Leahy Scale: Poor                               Pertinent Vitals/Pain Pain Assessment: 0-10 Pain Score: 3  Pain Location: R shoulder  Pain Descriptors / Indicators: Discomfort;Sore Pain Intervention(s): Monitored during session;Repositioned    Home Living Family/patient expects to be discharged to:: Skilled nursing facility                      Prior Function Level of Independence: Independent with assistive device(s)         Comments: ambulatory with RW prior to admission. Pt is a retired Customer service manager        Extremity/Trunk Assessment   Upper Extremity Assessment Upper Extremity Assessment: Defer to OT evaluation    Lower Extremity  Assessment Lower Extremity Assessment: Generalized weakness    Cervical / Trunk Assessment Cervical / Trunk Assessment: Normal  Communication   Communication: No difficulties  Cognition Arousal/Alertness: Awake/alert Behavior During Therapy: WFL for tasks assessed/performed Overall Cognitive Status: Within Functional Limits for tasks assessed                                        General Comments      Exercises     Assessment/Plan     PT Assessment Patient needs continued PT services  PT Problem List Decreased strength;Decreased balance;Decreased mobility;Decreased activity tolerance;Pain;Decreased knowledge of use of DME       PT Treatment Interventions DME instruction;Gait training;Functional mobility training;Therapeutic activities;Balance training;Patient/family education;Therapeutic exercise    PT Goals (Current goals can be found in the Care Plan section)  Acute Rehab PT Goals Patient Stated Goal: rehab to regain PLOF/independence PT Goal Formulation: With patient Time For Goal Achievement: 04/28/18 Potential to Achieve Goals: Good    Frequency Min 3X/week(for insurance notes)   Barriers to discharge        Co-evaluation               AM-PAC PT "6 Clicks" Mobility  Outcome Measure Help needed turning from your back to your side while in a flat bed without using bedrails?: A Lot Help needed moving from lying on your back to sitting on the side of a flat bed without using bedrails?: A Lot Help needed moving to and from a bed to a chair (including a wheelchair)?: A Lot Help needed standing up from a chair using your arms (e.g., wheelchair or bedside chair)?: A Lot Help needed to walk in hospital room?: A Lot Help needed climbing 3-5 steps with a railing? : Total 6 Click Score: 11    End of Session Equipment Utilized During Treatment: Gait belt(sling R UE) Activity Tolerance: Patient limited by fatigue Patient left: in bed;with call bell/phone within reach   PT Visit Diagnosis: Unsteadiness on feet (R26.81);Muscle weakness (generalized) (M62.81);Difficulty in walking, not elsewhere classified (R26.2);History of falling (Z91.81);Pain Pain - Right/Left: Right Pain - part of body: Shoulder    Time: 8315-1761 PT Time Calculation (min) (ACUTE ONLY): 14 min   Charges:   PT Evaluation $PT Eval Moderate Complexity: [place], PT Acute Rehabilitation Services Pager:  [phone] Office: [phone]

## 2018-04-14 NOTE — Progress Notes (Signed)
Occupational Therapy Treatment Patient Details Name: Hannah Powell MRN: [id number] DOB: 1945/04/02 Today's Date: 04/14/2018    History of present illness s/p R RTSA   OT comments  Pt verbalized to OT that she was so frustrated with [employer] she would shoot them all between the eyes if give the chance.  OT did report this to nursing.     Follow Up Recommendations  SNF    Equipment Recommendations  None recommended by OT(has [facility])       Precautions / Restrictions Precautions Precautions: Shoulder;Fall Type of Shoulder Precautions: May be out of sling in controlled environment.  May use operated arm for ADLs within parameters of 20 ER, 45 ABD, 60 FF, P/AA/ROM (functionally, no exercises other than lap slides, gentle pendulums).  May move elbow, wrist and fingers.  No resisted IR Shoulder Interventions: Shoulder sling/immobilizer Precaution Booklet Issued: Yes (comment) Restrictions Weight Bearing Restrictions: Yes RUE Weight Bearing: Non weight bearing       Mobility Bed Mobility Overal bed mobility: Needs Assistance Bed Mobility: Supine to Sit     Supine to sit: Mod assist        Transfers Overall transfer level: Needs assistance Equipment used: 1 person hand held assist Transfers: Sit to/from [employer] Transfers Sit to Stand: Mod assist Stand pivot transfers: Mod assist       General transfer comment: needed increased A this day    Balance Overall balance assessment: Needs assistance;History of Falls Sitting-balance support: Single extremity supported Sitting balance-Leahy Scale: Good     Standing balance support: Single extremity supported Standing balance-Leahy Scale: Poor                             ADL either performed or assessed with clinical judgement   ADL Overall ADL's : Needs assistance/impaired                     Lower Body Dressing: Maximal assistance;Sit to/from stand;Cueing for sequencing;Cueing for safety   Toilet Transfer: Moderate assistance;Stand-pivot;Cueing for safety;Cueing for sequencing;BSC   Toileting- Clothing Manipulation and Hygiene: Maximal assistance;Sit to/from stand;Cueing for sequencing;Cueing for safety         General ADL Comments: Pt did well with pendulums exercise this day  as well as lap slides with RUE in sitting position as well as elbow flexion and extension, wrist flexion/extension as well as fingers.  No edema noted.       Vision Patient Visual Report: No change from baseline            Cognition Arousal/Alertness: Awake/alert Behavior During Therapy: WFL for tasks assessed/performed Overall Cognitive Status: Within Functional Limits for tasks assessed                                          Exercises  Gentle pendulum in sitting, lap slides, elbow ROM as well as finger/wrist  ROM   Shoulder Instructions  sling on at all times          Pertinent Vitals/ Pain       Pain Score: 2  Pain Location: R shoulder with pendulum Pain Descriptors / Indicators: Sore Pain Intervention(s): Limited activity within patient's tolerance;Repositioned         Frequency  Min 2X/week        Progress Toward Goals  OT Goals(current goals can now  be found in the care plan section)     Acute Rehab OT Goals Patient Stated Goal: good recovery  Plan Discharge plan remains appropriate       AM-PAC OT "6 Clicks" Daily Activity     Outcome Measure   Help from another person eating meals?: A Little Help from another person taking care of personal grooming?: A Little Help from another person toileting, which includes using toliet, bedpan, or urinal?: A Little Help from another person bathing (including washing, rinsing, drying)?: A Little Help from another person to put on and taking off regular upper body clothing?: A Little Help from another person to put on and taking off regular lower body clothing?: A Lot 6 Click Score: 17    End of  Session    OT Visit Diagnosis: Unsteadiness on feet (R26.81);Pain;Muscle weakness (generalized) (M62.81) Pain - Right/Left: Right Pain - part of body: Shoulder   Activity Tolerance Patient tolerated treatment well   Patient Left in chair;with call bell/phone within reach   Nurse Communication Mobility status        Time: 8088-1103 OT Time Calculation (min): 28 min  Charges: OT General Charges $OT Visit: 1 Visit OT Treatments $Self Care/Home Management : 23-37 mins  Kari Baars, [place] [phone] Office- [phone]      Aja Bolander, Edwena Felty D 04/14/2018, 1:00 PM

## 2018-04-15 DIAGNOSIS — M25511 Pain in right shoulder: Secondary | ICD-10-CM | POA: Diagnosis not present

## 2018-04-15 DIAGNOSIS — I5032 Chronic diastolic (congestive) heart failure: Secondary | ICD-10-CM | POA: Diagnosis not present

## 2018-04-15 DIAGNOSIS — I2510 Atherosclerotic heart disease of native coronary artery without angina pectoris: Secondary | ICD-10-CM | POA: Diagnosis not present

## 2018-04-15 DIAGNOSIS — I10 Essential (primary) hypertension: Secondary | ICD-10-CM | POA: Diagnosis not present

## 2018-04-15 DIAGNOSIS — J449 Chronic obstructive pulmonary disease, unspecified: Secondary | ICD-10-CM | POA: Diagnosis not present

## 2018-04-15 DIAGNOSIS — Z96611 Presence of right artificial shoulder joint: Secondary | ICD-10-CM | POA: Diagnosis not present

## 2018-04-15 DIAGNOSIS — E039 Hypothyroidism, unspecified: Secondary | ICD-10-CM | POA: Diagnosis not present

## 2018-04-15 DIAGNOSIS — J9601 Acute respiratory failure with hypoxia: Secondary | ICD-10-CM | POA: Diagnosis not present

## 2018-04-15 DIAGNOSIS — J9620 Acute and chronic respiratory failure, unspecified whether with hypoxia or hypercapnia: Secondary | ICD-10-CM | POA: Diagnosis not present

## 2018-04-15 DIAGNOSIS — Z471 Aftercare following joint replacement surgery: Secondary | ICD-10-CM | POA: Diagnosis not present

## 2018-04-15 DIAGNOSIS — G2581 Restless legs syndrome: Secondary | ICD-10-CM | POA: Diagnosis not present

## 2018-04-15 DIAGNOSIS — M19011 Primary osteoarthritis, right shoulder: Secondary | ICD-10-CM | POA: Diagnosis not present

## 2018-04-15 DIAGNOSIS — E876 Hypokalemia: Secondary | ICD-10-CM | POA: Diagnosis not present

## 2018-04-15 DIAGNOSIS — N3281 Overactive bladder: Secondary | ICD-10-CM | POA: Diagnosis not present

## 2018-04-15 DIAGNOSIS — M6281 Muscle weakness (generalized): Secondary | ICD-10-CM | POA: Diagnosis not present

## 2018-04-15 DIAGNOSIS — J441 Chronic obstructive pulmonary disease with (acute) exacerbation: Secondary | ICD-10-CM | POA: Diagnosis not present

## 2018-04-15 DIAGNOSIS — I509 Heart failure, unspecified: Secondary | ICD-10-CM | POA: Diagnosis not present

## 2018-04-15 NOTE — Progress Notes (Signed)
[facility] was called regarding the pt's insurance approval for [place] [employer]. I was asked by the social worker, Elmyra Ricks to call the MD for a d/c order. I was advised by Mattie Marlin that a d/c order will be put in soon.

## 2018-04-15 NOTE — Progress Notes (Signed)
At 1410, Hannah Powell from [facility] was provided with report. The pt is being transported by her Husband.

## 2018-04-15 NOTE — Progress Notes (Signed)
Occupational Therapy Treatment Patient Details Name: Hannah Powell MRN: [id number] DOB: February 05, 1946 Today's Date: 04/15/2018    History of present illness 73 yo female s/p R Reverse TSA.   OT comments  Performed UB adls, using RUE to assist within parameters. Also performed toileting and bed mobility.  Pt performed lap slides and pendulums from seated position  Follow Up Recommendations  SNF    Equipment Recommendations  None recommended by OT(has [facility])    Recommendations for Other Services      Precautions / Restrictions Precautions Precautions: Shoulder;Fall Type of Shoulder Precautions: May be out of sling in controlled environment.  May use operated arm for ADLs within parameters of 20 ER, 45 ABD, 60 FF, P/AA/ROM (functionally, no exercises other than lap slides, gentle pendulums).  May move elbow, wrist and fingers.  No resisted IR Shoulder Interventions: Shoulder sling/immobilizer Precaution Booklet Issued: Yes (comment) Precaution Comments: PT USES A WALKER AT BASELINE.  USED RW WITH ONLY LUE Restrictions RUE Weight Bearing: Non weight bearing       Mobility Bed Mobility         Supine to sit: Min assist;Mod assist;HOB elevated Sit to supine: Mod assist;Max assist   General bed mobility comments: cues; assist for trunk for OOB and for bil LEs for back to bed  Transfers       Sit to Stand: Min assist Stand pivot transfers: Min assist       General transfer comment: steadying assistance for SPT. PT used bedrail and armrest of commode    Balance                                           ADL either performed or assessed with clinical judgement   ADL           Upper Body Bathing: Moderate assistance;Sitting       Upper Body Dressing : Moderate assistance       Toilet Transfer: Minimal assistance;Stand-pivot;BSC   Toileting- Clothing Manipulation and Hygiene: Maximal assistance;Sit to/from stand         General ADL  Comments: performed UB adls from EOB, reviewed parameters, used toilet and educated on toilet aide.  Pt's breakfast was brought in.       Vision       Perception     Praxis      Cognition Arousal/Alertness: Awake/alert Behavior During Therapy: WFL for tasks assessed/performed Overall Cognitive Status: Within Functional Limits for tasks assessed                                          Exercises  performed lapslides and seated pendulums   Shoulder Instructions       General Comments      Pertinent Vitals/ Pain       Pain Score: 1 Pain Location: R shoulder  Pain Descriptors / Indicators: Discomfort;Sore Pain Intervention(s): Limited activity within patient's tolerance;Monitored during session;Premedicated before session;Repositioned  Home Living                                          Prior Functioning/Environment              Frequency  Min 2X/week        Progress Toward Goals  OT Goals(current goals can now be found in the care plan section)  Progress towards OT goals: Progressing toward goals     Plan      Co-evaluation                 AM-PAC OT "6 Clicks" Daily Activity     Outcome Measure   Help from another person eating meals?: A Little Help from another person taking care of personal grooming?: A Little Help from another person toileting, which includes using toliet, bedpan, or urinal?: A Lot Help from another person bathing (including washing, rinsing, drying)?: A Lot Help from another person to put on and taking off regular upper body clothing?: A Lot Help from another person to put on and taking off regular lower body clothing?: A Lot 6 Click Score: 14    End of Session    OT Visit Diagnosis: Unsteadiness on feet (R26.81);Pain;Muscle weakness (generalized) (M62.81) Pain - Right/Left: Right Pain - part of body: Shoulder   Activity Tolerance Patient tolerated treatment well   Patient Left  in bed;with call bell/phone within reach;with family/visitor present   Nurse Communication          Time: 0071-2197 OT Time Calculation (min): 26 min  Charges: OT General Charges $OT Visit: 1 Visit OT Treatments $Self Care/Home Management : 23-37 mins  Lesle Chris, OTR/L Acute Rehabilitation Services [phone] [place] pager [phone] office 04/15/2018   [place] 04/15/2018, 9:46 AM

## 2018-04-15 NOTE — Clinical Social Work Placement (Signed)
D/C Summary sent.  Nurse call report to: [phone] Room [place]  NOTE  Date:  04/15/2018  Patient Details  Name: Hannah Powell MRN: [id number] Date of Birth: October 18, 1945  Clinical Social Work is seeking post-discharge placement for this patient at the [place] level of care (*CSW will initial, date and re-position this form in  chart as items are completed):  Yes   Patient/family provided with [facility] Work Department's list of facilities offering this level of care within the geographic area requested by the patient (or if unable, by the patient's family).  Yes   Patient/family informed of their freedom to choose among providers that offer the needed level of care, that participate in Medicare, Medicaid or managed care program needed by the patient, have an available bed and are willing to accept the patient.  Yes   Patient/family informed of [facility]'s ownership interest in [facility] and [facility], as well as of the fact that they are under no obligation to receive care at these facilities.  PASRR submitted to EDS on       PASRR number received on       Existing PASRR number confirmed on 04/11/18     FL2 transmitted to all facilities in geographic area requested by pt/family on       FL2 transmitted to all facilities within larger geographic area on 04/11/18     Patient informed that his/her managed care company has contracts with or will negotiate with certain facilities, including the following:  [facility]     Yes   Patient/family informed of bed offers received.  Patient chooses bed at [facility]     Physician recommends and patient chooses bed at      Patient to be transferred to [facility] [place] on 04/15/18.  Patient to be transferred to facility by Spouse to transport.      Patient family notified on 04/15/18 of transfer.  Name of family member  notified:  Spouse      PHYSICIAN       Additional Comment:    _______________________________________________ Lia Hopping, LCSW 04/15/2018, 2:05 PM

## 2018-04-16 ENCOUNTER — Encounter (HOSPITAL_COMMUNITY): Payer: Self-pay | Admitting: Orthopedic Surgery

## 2018-04-17 DIAGNOSIS — E039 Hypothyroidism, unspecified: Secondary | ICD-10-CM | POA: Diagnosis not present

## 2018-04-17 DIAGNOSIS — J449 Chronic obstructive pulmonary disease, unspecified: Secondary | ICD-10-CM | POA: Diagnosis not present

## 2018-04-17 DIAGNOSIS — I10 Essential (primary) hypertension: Secondary | ICD-10-CM | POA: Diagnosis not present

## 2018-04-17 DIAGNOSIS — Z96611 Presence of right artificial shoulder joint: Secondary | ICD-10-CM | POA: Diagnosis not present

## 2018-04-17 DIAGNOSIS — M25511 Pain in right shoulder: Secondary | ICD-10-CM | POA: Diagnosis not present

## 2018-04-17 DIAGNOSIS — N3281 Overactive bladder: Secondary | ICD-10-CM | POA: Diagnosis not present

## 2018-04-17 DIAGNOSIS — G2581 Restless legs syndrome: Secondary | ICD-10-CM | POA: Diagnosis not present

## 2018-04-23 DIAGNOSIS — Z471 Aftercare following joint replacement surgery: Secondary | ICD-10-CM | POA: Diagnosis not present

## 2018-04-23 DIAGNOSIS — G2581 Restless legs syndrome: Secondary | ICD-10-CM | POA: Diagnosis not present

## 2018-04-23 DIAGNOSIS — J449 Chronic obstructive pulmonary disease, unspecified: Secondary | ICD-10-CM | POA: Diagnosis not present

## 2018-04-23 DIAGNOSIS — I10 Essential (primary) hypertension: Secondary | ICD-10-CM | POA: Diagnosis not present

## 2018-04-23 DIAGNOSIS — N3281 Overactive bladder: Secondary | ICD-10-CM | POA: Diagnosis not present

## 2018-04-23 DIAGNOSIS — E039 Hypothyroidism, unspecified: Secondary | ICD-10-CM | POA: Diagnosis not present

## 2018-04-23 DIAGNOSIS — Z96611 Presence of right artificial shoulder joint: Secondary | ICD-10-CM | POA: Diagnosis not present

## 2018-04-23 DIAGNOSIS — M25511 Pain in right shoulder: Secondary | ICD-10-CM | POA: Diagnosis not present

## 2018-04-30 ENCOUNTER — Other Ambulatory Visit: Payer: Self-pay

## 2018-04-30 ENCOUNTER — Ambulatory Visit: Payer: Medicare HMO | Attending: Orthopedic Surgery

## 2018-04-30 DIAGNOSIS — M25511 Pain in right shoulder: Secondary | ICD-10-CM | POA: Insufficient documentation

## 2018-04-30 DIAGNOSIS — G8929 Other chronic pain: Secondary | ICD-10-CM | POA: Diagnosis not present

## 2018-04-30 DIAGNOSIS — Z961 Presence of intraocular lens: Secondary | ICD-10-CM | POA: Diagnosis not present

## 2018-04-30 DIAGNOSIS — M6281 Muscle weakness (generalized): Secondary | ICD-10-CM

## 2018-04-30 DIAGNOSIS — Z96611 Presence of right artificial shoulder joint: Secondary | ICD-10-CM

## 2018-04-30 DIAGNOSIS — M25611 Stiffness of right shoulder, not elsewhere classified: Secondary | ICD-10-CM

## 2018-04-30 NOTE — Patient Outreach (Signed)
[facility]) Care Management  04/30/2018  Hannah Powell 03-24-1945 [id number]    EMMI-General Discharge RED ON EMMI ALERT Day # 1 Date: 04/29/18 Red Alert Reason: " Unfilled prescriptions? Yes"   Outreach attempt # 1 to patient. Spoke with patient who states she is doing well. She denies any acute issues or concerns. Reviewed and addressed red alert with patient. She voices that machine did not hear her correctly. She denies any issues or concerns regarding her meds and voices that she has all of them in the home. Patient reports she starts PT today. She reports that her pain has been better since the surgery than it was prior to surgery. She reports she has only had to take Ibuprofen occasionally. She is also using heat and ice therapy as advised. Patient instructed on importance of pain mgmt and pre-medicating prior to therapy sessions to alleviate increased pain event. 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 She has support system to help her out as needed. She has already followed up with MD and goes back again in one month.She denies any issues with transportation. No further RN CM needs or concerns identified. Advised patient that they would get one more automated EMMI-GENERAL post discharge calls to assess how they are doing following recent hospitalization and will receive a call from a nurse if any of their responses were abnormal. Patient voiced understanding and was appreciative of f/u call.   Plan: RN CM will close case as no further interventions needed at this time.   Enzo Montgomery, RN,BSN,CCM [place] Management Telephonic Care Management Coordinator Direct Phone: [phone] Toll Free: [phone] Fax: [phone]

## 2018-04-30 NOTE — Patient Instructions (Signed)
Access Code: [email or web address]  URL: [email or web address]  Date: 04/30/2018  Prepared by: Lieutenant Diego   Exercises  Seated Shoulder Flexion AAROM with Pulley Behind - 15 reps - 1 sets - 5 hold - 2x daily - 7x weekly  Seated Shoulder Scaption AAROM with Pulley at Side - 15 reps - 1 sets - 5 hold - 2x daily - 7x weekly  Seated Shoulder Flexion Towel Slide at Table Top - 15 reps - 1 sets - 5 hold - 2x daily - 7x weekly  Seated Shoulder Scaption Slide at Table Top with Forearm in Neutral - 15 reps - 1 sets - 5 hold - 2x daily - 7x weekly  Isometric Shoulder Abduction - 10 reps - 1 sets - 3 hold - 1x daily - 7x weekly  Seated Isometric Shoulder External Rotation - 15 reps - 1 sets - 3 hold - 1x daily - 7x weekly

## 2018-05-01 DIAGNOSIS — J9611 Chronic respiratory failure with hypoxia: Secondary | ICD-10-CM | POA: Diagnosis not present

## 2018-05-01 DIAGNOSIS — I10 Essential (primary) hypertension: Secondary | ICD-10-CM | POA: Diagnosis not present

## 2018-05-01 DIAGNOSIS — G63 Polyneuropathy in diseases classified elsewhere: Secondary | ICD-10-CM | POA: Diagnosis not present

## 2018-05-01 DIAGNOSIS — Z96611 Presence of right artificial shoulder joint: Secondary | ICD-10-CM | POA: Diagnosis not present

## 2018-05-01 DIAGNOSIS — M1710 Unilateral primary osteoarthritis, unspecified knee: Secondary | ICD-10-CM | POA: Diagnosis not present

## 2018-05-01 DIAGNOSIS — I5032 Chronic diastolic (congestive) heart failure: Secondary | ICD-10-CM | POA: Diagnosis not present

## 2018-05-01 DIAGNOSIS — M48062 Spinal stenosis, lumbar region with neurogenic claudication: Secondary | ICD-10-CM | POA: Diagnosis not present

## 2018-05-01 DIAGNOSIS — E034 Atrophy of thyroid (acquired): Secondary | ICD-10-CM | POA: Diagnosis not present

## 2018-05-01 DIAGNOSIS — J438 Other emphysema: Secondary | ICD-10-CM | POA: Diagnosis not present

## 2018-05-01 NOTE — Therapy (Signed)
[facility] [address] [place], [name], [address] Phone: [phone]   Fax:  [phone]  Physical Therapy Evaluation  Patient Details  Name: Hannah Powell MRN: [id number] Date of Birth: 06-05-1945 No data recorded  Encounter Date: 04/30/2018  PT End of Session - 05/01/18 1150    Visit Number  1    Number of Visits  16    Date for PT Re-Evaluation  06/25/18    Authorization - Visit Number  1    Authorization - Number of Visits  10    PT Start Time  1601    PT Stop Time  1655    PT Time Calculation (min)  54 min    Activity Tolerance  Patient tolerated treatment well;Patient limited by pain    Behavior During Therapy  [facility] for tasks assessed/performed       Past Medical History:  Diagnosis Date  . Anxiety   . Arthritis   . CHF (congestive heart failure) ([place])   . COPD (chronic obstructive pulmonary disease) ([place])   . Edema    feet/legs  . Hypertension    dr Otho Najjar     . Hypothyroidism   . Leg weakness, bilateral   . Osteoporosis   . Oxygen deficit    2l with bipap at night  . RLS (restless legs syndrome)   . Shortness of breath   . Sleep apnea    bipap  . Wheezing     Past Surgical History:  Procedure Laterality Date  . BACK SURGERY     cervical  . BREAST BIOPSY Left    needle bx-neg  . CATARACT EXTRACTION W/PHACO Left 05/07/2017   Procedure: CATARACT EXTRACTION PHACO AND INTRAOCULAR LENS PLACEMENT (IOC);  Surgeon: Birder Robson, MD;  Location: [facility];  Service: Ophthalmology;  Laterality: Left;  [place] 00:48.7 AP% 11.3 CDE 5.46 Fluid Pack Lot # [id number]  . CATARACT EXTRACTION W/PHACO Right 04/23/2017   Procedure: CATARACT EXTRACTION PHACO AND INTRAOCULAR LENS PLACEMENT ([place]);  Surgeon: Birder Robson, MD;  Location: [facility];  Service: Ophthalmology;  Laterality: Right;  [place] 00:29 AP% 12.8 CDE 3.79 FLUID PACK LOT # [id number] H  . FOOT ARTHROPLASTY    . JOINT REPLACEMENT     x2 tkr  . TOTAL  SHOULDER ARTHROPLASTY Left 07/02/2013   Procedure: LEFT TOTAL SHOULDER ARTHROPLASTY;  Surgeon: Marin Shutter, MD;  Location: [place];  Service: Orthopedics;  Laterality: Left;  . TOTAL SHOULDER ARTHROPLASTY Right 04/10/2018   Procedure: TOTAL REVERSE SHOULDER ARTHROPLASTY;  Surgeon: Justice Britain, MD;  Location: [facility];  Service: Orthopedics;  Laterality: Right;  118min    There were no vitals filed for this visit.   Subjective Assessment - 05/01/18 0724    Subjective  Pt. entered clinic with S/P right reverse shoulder replacement on (04/10/18). Pt. reports receiving PT for a week and a half at a skilled care facility. Pt. clearly understands surgical precautions and repeated them verbally. Pt. reports currently performing elbow, wrist and hand ROM at home. Pt. also noted her husband has been doing PROM on her right shoulder within the precaution guidelines. Pt. reports controlling her pain with heat/ice and prescribed pain meds.     Pertinent History  extensive PMH including intensive care unit stays, rehab facility stays, previously ambulated with RW, is now using quad cane due to shoulder precautions. COPD, SOB, HTN. Denies CHF, reported she has been cleared by a cardiologist.    Limitations  Walking;Lifting;Standing;Writing;House hold activities  Currently in Pain?  No/denies        Observation:  MUSCULOSKELETAL: Tremor: Normal Bulk: Normal Tone: Normal   Palpation/observation No pain with palpation along incision. PT noted no abnormal swelling, redness, or drainage from incision.    AROM (seated) PT differed to measure left shoulder during future visits to obtain general baseline measurement for shoulder motion. R elbow flexion: [facility] R elbow extension: WFL R supination: WFL R pronation: [facility] R wrist flexion: WFL R elbow flexion: WFL   PROM (supine) right shoulder  R shoulder flex: 90 degrees (easily with no pain) R shoulder abduction: 60 degrees obtained R ER: 10 degrees  (hypomobility noted) R IR: WFL    Objective measurements completed on examination: See above findings.    Manual therapy:  PROM of right shoulder flexion, abd, ER, IR in supine position (all motion was completed within postsurgical precaution guidelines)   Therex:  Shoulder ER and abduction isometrics 5x5 sec static holds (Pt. Educated to not get above 3/10 pain)  Right shoulder seated AAROM with table and opposite arm in flexion and abduction 1x10 (table slides) (Min verbal cueing to use other hand to assist with movement)  AAROM seated cable pulleys in sitting in flexion and abduction 1x15 verbal and tactile cues to maintain protocol      PT Short Term Goals - 05/01/18 1612      PT SHORT TERM GOAL #1   Title  Pt. will increase AAROM with ER to 30 degrees in neutral  without increase in pain to regain independent self-care tasks.     Baseline  10degs on evaluation    Time  4    Period  Weeks    Status  New    Target Date  05/28/18        PT Long Term Goals - 05/01/18 1257      PT LONG TERM GOAL #1   Title  Pt. will improve FOTO to 59 to demonstrate improved independence with functional ADL's and decrease pain.     Baseline  Pt. currently scored a 19/59 (04/30/18).    Time  4    Period  Weeks    Status  New    Target Date  05/28/18      PT LONG TERM GOAL #2   Title  --    Baseline  --    Time  --    Period  --    Status  --      PT LONG TERM GOAL #3   Title  Pt. will achieve 110 degrees of AAROM/AROM of shoulder flexion to reach overhead and complete cooking and cleaning task around the house.    Baseline  Pt. currently has 60 degrees of PROM of right shoulder.     Time  4    Period  Weeks    Status  New    Target Date  05/28/18      PT LONG TERM GOAL #4   Title  Pt. will be able to complete HEP and daily activities with less than 4/10 pain to indicate and improved ability to perform functional activities.    Baseline  Pt. currently experiences 8/10 NPS  occasionally after completing HEP.     Time  4    Period  Weeks    Status  New    Target Date  05/28/18      PT LONG TERM GOAL #5   Title  The patient will demonstrate MMT grade of RUE of  a least 4/5 to indicate an improved ability to perform functional activities.    Baseline  unable to MMT due to procotol on evaluation    Time  8    Period  Weeks    Status  New    Target Date  06/27/18      PT LONG TERM GOAL #6   Title  The patient will demonstrate AROM of R shoulder WFLs per reverse total shoulder to demonstrate improved functional activities.     Baseline  unable per protocol on evaluation.    Time  8    Period  Weeks    Status  New             Plan - 05/01/18 1251    Clinical Impression Statement  Pt. is pleasant 73 y.o. Female with S/P right reverse total shoulder arthroplasty. Pt. has significant limitations of right shoulder flexion, IR, and ER ROM that limit her from cooking independently at home. Pt. also has significant strength deficits that keep her from being able to easily complete personal hygiene. Pt. reports controlling her pain with heat/ice and prescribed pain meds. PROM of right shoulder ER was 10 degrees with joint stiffness noted. Pt. tolerated isometric strengthening in abduction and ER with no increase in pain beyond 3/10 NPS. Pt. is limited by increased thoracic kyphosis that makes it difficult for her to move and ambulate well. Pt. was positive and motivated for treatment throughout evaluation. Pt. has good home support from her husband and noted he has been helping perform PROM on her right shoulder within the precaution guidelines. Pt. will benefit from skilled PT to improve shoulder range of motion and decrease pain per surgical protocol to regain independence with cooking, personal hygiene and functional ADL's.    Clinical Presentation  Evolving    Clinical Decision Making  Moderate    PT Frequency  2x / week    PT Duration  8 weeks    PT  Treatment/Interventions  Cryotherapy;Electrical Stimulation;Ultrasound;Moist Heat;Gait training;Functional mobility training;Therapeutic activities;Therapeutic exercise;Neuromuscular re-education;Scar mobilization    PT Next Visit Plan  UE strneghening and mobility within surgical precaution guidelines     PT Home Exercise Plan  See prescribed HEP.     Consulted and Agree with Plan of Care  Patient       Patient will benefit from skilled therapeutic intervention in order to improve the following deficits and impairments:  Decreased endurance, Decreased range of motion, Decreased skin integrity, Decreased strength, Hypomobility, Impaired UE functional use, Increased fascial restricitons, Pain, Postural dysfunction, Impaired flexibility, Decreased scar mobility, Decreased mobility  Visit Diagnosis: S/P reverse total shoulder arthroplasty, right  Stiffness of right shoulder, not elsewhere classified  Chronic right shoulder pain  Muscle weakness of right upper extremity     Problem List Patient Active Problem List   Diagnosis Date Noted  . S/P reverse total shoulder arthroplasty, 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  . Chronic diastolic heart failure ([place]) 08/09/2016  . COPD (chronic obstructive pulmonary disease) ([place]) 08/09/2016  . Pulmonary infiltrates   . Pneumonia due to Streptococcus pneumoniae ([place])   . Fever   . Community acquired pneumonia of right lung   . Palliative care by specialist   . Goals of care, counseling/discussion   . Pressure injury of skin 07/03/2016  . COPD exacerbation ([place]) 07/21/2015  . Acute renal insufficiency 07/21/2015  . Hyperglycemia 07/21/2015  . Benign essential HTN 07/21/2015  . S/P shoulder replacement 07/02/2013    Lieutenant Diego PT, DPT 7:29 PM,05/01/18 [phone]  This entire session was performed under direct supervision and direction of a licensed therapist/therapist assistant . I have personally read, edited and approve of the note as  written.  [facility] [address] [place], [name], [address] Phone: [phone]   Fax:  [phone]  Name: Ritaj Dullea MRN: [id number] Date of Birth: Apr 17, 1945

## 2018-05-05 DIAGNOSIS — R0689 Other abnormalities of breathing: Secondary | ICD-10-CM | POA: Diagnosis not present

## 2018-05-05 DIAGNOSIS — R0902 Hypoxemia: Secondary | ICD-10-CM | POA: Diagnosis not present

## 2018-05-05 DIAGNOSIS — J449 Chronic obstructive pulmonary disease, unspecified: Secondary | ICD-10-CM | POA: Diagnosis not present

## 2018-05-05 DIAGNOSIS — I509 Heart failure, unspecified: Secondary | ICD-10-CM | POA: Diagnosis not present

## 2018-05-07 ENCOUNTER — Encounter: Payer: Self-pay | Admitting: Physical Therapy

## 2018-05-07 ENCOUNTER — Ambulatory Visit: Payer: Medicare HMO | Admitting: Physical Therapy

## 2018-05-07 DIAGNOSIS — Z96611 Presence of right artificial shoulder joint: Secondary | ICD-10-CM

## 2018-05-07 DIAGNOSIS — M6281 Muscle weakness (generalized): Secondary | ICD-10-CM | POA: Diagnosis not present

## 2018-05-07 DIAGNOSIS — G8929 Other chronic pain: Secondary | ICD-10-CM | POA: Diagnosis not present

## 2018-05-07 DIAGNOSIS — M25511 Pain in right shoulder: Secondary | ICD-10-CM

## 2018-05-07 DIAGNOSIS — M25611 Stiffness of right shoulder, not elsewhere classified: Secondary | ICD-10-CM | POA: Diagnosis not present

## 2018-05-07 NOTE — Therapy (Signed)
[facility] [address] [place], [name], [address] Phone: [phone]   Fax:  [phone]  Physical Therapy Treatment  Patient Details  Name: Hannah Powell MRN: [id number] Date of Birth: 1945-12-24 No data recorded  Encounter Date: 05/07/2018  PT End of Session - 05/07/18 1109    Visit Number  2    Number of Visits  16    Date for PT Re-Evaluation  06/25/18    PT Start Time  1114    PT Stop Time  1205    PT Time Calculation (min)  51 min    Activity Tolerance  Patient tolerated treatment well;Patient limited by pain    Behavior During Therapy  [facility] for tasks assessed/performed       Past Medical History:  Diagnosis Date  . Anxiety   . Arthritis   . CHF (congestive heart failure) ([place])   . COPD (chronic obstructive pulmonary disease) ([place])   . Edema    feet/legs  . Hypertension    dr Otho Najjar     . Hypothyroidism   . Leg weakness, bilateral   . Osteoporosis   . Oxygen deficit    2l with bipap at night  . RLS (restless legs syndrome)   . Shortness of breath   . Sleep apnea    bipap  . Wheezing     Past Surgical History:  Procedure Laterality Date  . BACK SURGERY     cervical  . BREAST BIOPSY Left    needle bx-neg  . CATARACT EXTRACTION W/PHACO Left 05/07/2017   Procedure: CATARACT EXTRACTION PHACO AND INTRAOCULAR LENS PLACEMENT (IOC);  Surgeon: Birder Robson, MD;  Location: [facility];  Service: Ophthalmology;  Laterality: Left;  [place] 00:48.7 AP% 11.3 CDE 5.46 Fluid Pack Lot # [id number]  . CATARACT EXTRACTION W/PHACO Right 04/23/2017   Procedure: CATARACT EXTRACTION PHACO AND INTRAOCULAR LENS PLACEMENT ([place]);  Surgeon: Birder Robson, MD;  Location: [facility];  Service: Ophthalmology;  Laterality: Right;  [place] 00:29 AP% 12.8 CDE 3.79 FLUID PACK LOT # [id number] H  . FOOT ARTHROPLASTY    . JOINT REPLACEMENT     x2 tkr  . TOTAL SHOULDER ARTHROPLASTY Left 07/02/2013   Procedure: LEFT TOTAL SHOULDER  ARTHROPLASTY;  Surgeon: Marin Shutter, MD;  Location: [place];  Service: Orthopedics;  Laterality: Left;  . TOTAL SHOULDER ARTHROPLASTY Right 04/10/2018   Procedure: TOTAL REVERSE SHOULDER ARTHROPLASTY;  Surgeon: Justice Britain, MD;  [facility];  Service: Orthopedics;  Laterality: Right;  185min    There were no vitals filed for this visit.  Subjective Assessment - 05/07/18 1235    Subjective  Pt. Entered clinic today without pain today. Pt. Reported following her HEP without difficulty over the weekend.     Pertinent History  extensive PMH including intensive care unit stays, rehab facility stays, previously ambulated with RW, is now using quad cane due to shoulder precautions. COPD, SOB, HTN. Denies CHF, reported she has been cleared by a cardiologist.    Limitations  Walking;Lifting;Standing;Writing;House hold activities    Currently in Pain?  No/denies        Manual therapy:  PROM of right shoulder flexion, abd, ER, IR in supine position (all motion was completed within postsurgical precaution guidelines)   Therex:  AAROM chest press, Flexion, and ER in supine 1x15 for each (min verbal cueing to follow surgical precautions)  AAROM flexion and ER in sitting 1x15 (PT provided mod verbal cueing for keeping symmetrical movement during flexion  while maintaining within prescribed movement precautions)  Seated right shoulder flexion isometrics 2x5 with 10sec holds. (PT provided mod verbal cueing to keep pt. Form moving into active flexion)  Seated right shoulder ER isometrics 2x5 with 10 sec holds Seated right shoulder adduction isometrics 2x5 with 10 sec holds AAROM seated cable pulleys in sitting in flexion and abduction 1x15 with 3 sec holds (PT provided verbal and tactile cues to maintain protocol)     PT Education - 05/08/18 0721    Education provided  Yes    Education Details  See HEP    Person(s) Educated  Patient    Methods  Explanation;Demonstration;Handout     Comprehension  Verbalized understanding;Returned demonstration       PT Short Term Goals - 05/01/18 1612      PT SHORT TERM GOAL #1   Title  Pt. will increase AAROM with ER to 30 degrees in neutral  without increase in pain to regain independent self-care tasks.     Baseline  10degs on evaluation    Time  4    Period  Weeks    Status  New    Target Date  05/28/18        PT Long Term Goals - 05/01/18 1257      PT LONG TERM GOAL #1   Title  Pt. will improve FOTO to 59 to demonstrate improved independence with functional ADL's and decrease pain.     Baseline  Pt. currently scored a 19/59 (04/30/18).    Time  4    Period  Weeks    Status  New    Target Date  05/28/18      PT LONG TERM GOAL #2   Title  --    Baseline  --    Time  --    Period  --    Status  --      PT LONG TERM GOAL #3   Title  Pt. will achieve 110 degrees of AAROM/AROM of shoulder flexion to reach overhead and complete cooking and cleaning task around the house.    Baseline  Pt. currently has 60 degrees of PROM of right shoulder.     Time  4    Period  Weeks    Status  New    Target Date  05/28/18      PT LONG TERM GOAL #4   Title  Pt. will be able to complete HEP and daily activities with less than 4/10 pain to indicate and improved ability to perform functional activities.    Baseline  Pt. currently experiences 8/10 NPS occasionally after completing HEP.     Time  4    Period  Weeks    Status  New    Target Date  05/28/18      PT LONG TERM GOAL #5   Title  The patient will demonstrate MMT grade of RUE of a least 4/5 to indicate an improved ability to perform functional activities.    Baseline  unable to MMT due to procotol on evaluation    Time  8    Period  Weeks    Status  New    Target Date  06/27/18      PT LONG TERM GOAL #6   Title  The patient will demonstrate AROM of R shoulder WFLs per reverse total shoulder to demonstrate improved functional activities.     Baseline  unable per  protocol on evaluation.    Time  8  Period  Weeks    Status  New         Plan - 05/08/18 7616    Clinical Impression Statement  Pt. entered clinic with excellent motivation to participate in therapy today. Pt. was limited with flexion and abduction secondary to surgical protocol. Pt. did not experience any pain or discomfort during the entirety of the session. PT provided min verbal cueing for pt. to maintain proper motion within proper surgical guidelines. Pt. will continue to benefit from skilled PT intervention to improve ROM and UE strength to become independent with ADL's.     Clinical Presentation  Evolving    Clinical Decision Making  Moderate    PT Frequency  2x / week    PT Duration  8 weeks    PT Treatment/Interventions  Cryotherapy;Electrical Stimulation;Ultrasound;Moist Heat;Gait training;Functional mobility training;Therapeutic activities;Therapeutic exercise;Neuromuscular re-education;Scar mobilization    PT Next Visit Plan  UE strneghening and mobility within surgical precaution guidelines     PT Home Exercise Plan  See prescribed HEP.     Consulted and Agree with Plan of Care  Patient;Family member/caregiver       Patient will benefit from skilled therapeutic intervention in order to improve the following deficits and impairments:  Decreased endurance, Decreased range of motion, Decreased skin integrity, Decreased strength, Hypomobility, Impaired UE functional use, Increased fascial restricitons, Pain, Postural dysfunction, Impaired flexibility, Decreased scar mobility, Decreased mobility  Visit Diagnosis: S/P reverse total shoulder arthroplasty, right  Stiffness of right shoulder, not elsewhere classified  Chronic right shoulder pain  Muscle weakness of right upper extremity  Muscle weakness (generalized)  Unsteadiness on feet  Imbalance  At risk for falls  Other abnormalities of gait and mobility     Problem List Patient Active Problem List    Diagnosis Date Noted  . S/P reverse total shoulder arthroplasty, 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  . Chronic diastolic heart failure ([place]) 08/09/2016  . COPD (chronic obstructive pulmonary disease) ([place]) 08/09/2016  . Pulmonary infiltrates   . Pneumonia due to Streptococcus pneumoniae ([place])   . Fever   . Community acquired pneumonia of right lung   . Palliative care by specialist   . Goals of care, counseling/discussion   . Pressure injury of skin 07/03/2016  . COPD exacerbation ([place]) 07/21/2015  . Acute renal insufficiency 07/21/2015  . Hyperglycemia 07/21/2015  . Benign essential HTN 07/21/2015  . S/P shoulder replacement 07/02/2013    Pura Spice, PT, DPT # [id number] Florentina Jenny, SPT 05/08/2018, 7:28 AM  [place] [facility] [address] [place], [name], [address] Phone: [phone]   Fax:  [phone]  Name: Hannah Powell MRN: [id number] Date of Birth: 07/08/1945

## 2018-05-07 NOTE — Patient Instructions (Signed)
Access Code: [email or web address]  URL: [email or web address]  Date: 05/07/2018  Prepared by: Dorcas Carrow   Exercises  Seated Shoulder Flexion AAROM with [place] - 10 reps - 3 sets - 1x daily - 7x weekly  Seated Shoulder External Rotation AAROM with Cane and Hand in Neutral - 10 reps - 3 sets - 1x daily - 7x weekly  Supine Shoulder Press AAROM in Abduction with Dowel - 10 reps - 3 sets - 1x daily - 7x weekly  Seated Isometric Shoulder External Rotation - 10 reps - 1 sets - 10 hold - 1x daily - 7x weekly  Standing Isometric Shoulder Flexion with Doorway - Arm Bent - 10 reps - 1 sets - 10 hold - 1x daily - 7x weekly  Seated Isometric Shoulder Adduction - 10 reps - 1 sets - 10 hold - 1x daily - 7x weekly  Seated Shoulder Flexion AAROM with Pulley Behind - 10 reps - 3 sets - 1x daily - 7x weekly  Seated Shoulder Abduction AAROM with Pulley Behind - 10 reps - 3 sets - 1x daily - 7x weekly

## 2018-05-09 ENCOUNTER — Ambulatory Visit: Payer: Medicare HMO | Admitting: Physical Therapy

## 2018-05-12 ENCOUNTER — Ambulatory Visit: Payer: Medicare HMO | Admitting: Physical Therapy

## 2018-05-12 ENCOUNTER — Encounter: Payer: Self-pay | Admitting: Physical Therapy

## 2018-05-12 DIAGNOSIS — M6281 Muscle weakness (generalized): Secondary | ICD-10-CM | POA: Diagnosis not present

## 2018-05-12 DIAGNOSIS — Z96611 Presence of right artificial shoulder joint: Secondary | ICD-10-CM | POA: Diagnosis not present

## 2018-05-12 DIAGNOSIS — G8929 Other chronic pain: Secondary | ICD-10-CM | POA: Diagnosis not present

## 2018-05-12 DIAGNOSIS — M25611 Stiffness of right shoulder, not elsewhere classified: Secondary | ICD-10-CM

## 2018-05-12 DIAGNOSIS — M25511 Pain in right shoulder: Secondary | ICD-10-CM | POA: Diagnosis not present

## 2018-05-12 NOTE — Therapy (Signed)
[facility] [address] [place], [name], [address] Phone: [phone]   Fax:  [phone]  Physical Therapy Treatment  Patient Details  Name: Hannah Powell MRN: [id number] Date of Birth: September 07, 1945 No data recorded  Encounter Date: 05/12/2018  PT End of Session - 05/12/18 1049    Visit Number  3    Number of Visits  16    Date for PT Re-Evaluation  06/25/18    Authorization - Visit Number  3    Authorization - Number of Visits  10    PT Start Time  1062    PT Stop Time  1154    PT Time Calculation (min)  49 min    Activity Tolerance  Patient tolerated treatment well;Patient limited by pain    Behavior During Therapy  [facility] for tasks assessed/performed       Past Medical History:  Diagnosis Date  . Anxiety   . Arthritis   . CHF (congestive heart failure) ([place])   . COPD (chronic obstructive pulmonary disease) ([place])   . Edema    feet/legs  . Hypertension    dr Otho Najjar     . Hypothyroidism   . Leg weakness, bilateral   . Osteoporosis   . Oxygen deficit    2l with bipap at night  . RLS (restless legs syndrome)   . Shortness of breath   . Sleep apnea    bipap  . Wheezing     Past Surgical History:  Procedure Laterality Date  . BACK SURGERY     cervical  . BREAST BIOPSY Left    needle bx-neg  . CATARACT EXTRACTION W/PHACO Left 05/07/2017   Procedure: CATARACT EXTRACTION PHACO AND INTRAOCULAR LENS PLACEMENT (IOC);  Surgeon: Birder Robson, MD;  Location: [facility];  Service: Ophthalmology;  Laterality: Left;  [place] 00:48.7 AP% 11.3 CDE 5.46 Fluid Pack Lot # [id number]  . CATARACT EXTRACTION W/PHACO Right 04/23/2017   Procedure: CATARACT EXTRACTION PHACO AND INTRAOCULAR LENS PLACEMENT ([place]);  Surgeon: Birder Robson, MD;  Location: [facility];  Service: Ophthalmology;  Laterality: Right;  [place] 00:29 AP% 12.8 CDE 3.79 FLUID PACK LOT # [id number]  . FOOT ARTHROPLASTY    . JOINT REPLACEMENT     x2 tkr  . TOTAL  SHOULDER ARTHROPLASTY Left 07/02/2013   Procedure: LEFT TOTAL SHOULDER ARTHROPLASTY;  Surgeon: Marin Shutter, MD;  Location: [place];  Service: Orthopedics;  Laterality: Left;  . TOTAL SHOULDER ARTHROPLASTY Right 04/10/2018   Procedure: TOTAL REVERSE SHOULDER ARTHROPLASTY;  Surgeon: Justice Britain, MD;  Location: [facility];  Service: Orthopedics;  Laterality: Right;  164min    There were no vitals filed for this visit.  Subjective Assessment - 05/13/18 0726    Subjective  Pt. Entered clinic today with no complaints of pain. Pt. Also reported she has been following her HEP without issue.    Pertinent History  extensive PMH including intensive care unit stays, rehab facility stays, previously ambulated with RW, is now using quad cane due to shoulder precautions. COPD, SOB, HTN. Denies CHF, reported she has been cleared by a cardiologist.    Limitations  Walking;Lifting;Standing;Writing;House hold activities    Currently in Pain?  No/denies         Manual therapy:  PROM of right shoulder flexion, abd, ER, IR in supine position (all motion was completed within postsurgical precaution guidelines) (PT noted slight hypomobility with right shoulder ER ROM)   Therex:  AAROM seated cable pulleys in sitting  in flexion and abduction 1x15 with 3 sec holds (PT providedverbal and tactile cues to maintain protocol) AAROM chest press 2x12 AAROM protraction 2x12 (PT provided mod verbal and tactile cues for pt. To achieve motion) AAROM ER UBE with no resistance for 2 min (Pt. Experienced slight pain with therex but PT was able to adjust to make it tolerable)  Supine right shoulder ER isometrics 2x5 with 10 sec holds Supine right shoulder abduction/adduction isometrics 2x5 with 10 sec holds Supine right shoulder flexion isometrics 2x5 with 10sec holds. (PT provided mod verbal cueing to keep pt. From moving into active flexion)       PT Short Term Goals - 05/01/18 1612      PT SHORT TERM GOAL #1    Title  Pt. will increase AAROM with ER to 30 degrees in neutral  without increase in pain to regain independent self-care tasks.     Baseline  10degs on evaluation    Time  4    Period  Weeks    Status  New    Target Date  05/28/18        PT Long Term Goals - 05/01/18 1257      PT LONG TERM GOAL #1   Title  Pt. will improve FOTO to 59 to demonstrate improved independence with functional ADL's and decrease pain.     Baseline  Pt. currently scored a 19/59 (04/30/18).    Time  4    Period  Weeks    Status  New    Target Date  05/28/18      PT LONG TERM GOAL #2   Title  --    Baseline  --    Time  --    Period  --    Status  --      PT LONG TERM GOAL #3   Title  Pt. will achieve 110 degrees of AAROM/AROM of shoulder flexion to reach overhead and complete cooking and cleaning task around the house.    Baseline  Pt. currently has 60 degrees of PROM of right shoulder.     Time  4    Period  Weeks    Status  New    Target Date  05/28/18      PT LONG TERM GOAL #4   Title  Pt. will be able to complete HEP and daily activities with less than 4/10 pain to indicate and improved ability to perform functional activities.    Baseline  Pt. currently experiences 8/10 NPS occasionally after completing HEP.     Time  4    Period  Weeks    Status  New    Target Date  05/28/18      PT LONG TERM GOAL #5   Title  The patient will demonstrate MMT grade of RUE of a least 4/5 to indicate an improved ability to perform functional activities.    Baseline  unable to MMT due to procotol on evaluation    Time  8    Period  Weeks    Status  New    Target Date  06/27/18      PT LONG TERM GOAL #6   Title  The patient will demonstrate AROM of R shoulder WFLs per reverse total shoulder to demonstrate improved functional activities.     Baseline  unable per protocol on evaluation.    Time  8    Period  Weeks    Status  New  Plan - 05/13/18 0727    Clinical Impression Statement  Pt.  entered clinic today with excellent motivation to participate in therapy. Pt. continues to be limited with ER PROM in supine position measuring 22 degrees of right shoulder. Pt. tolerated treatment well without significant increase in pain. Pt. did experience slight discomfort with UBE but PT was able to modify by seat distance to make it tolerable. Pt continues to complete isometric strength with ER, abduction and flexion without increase in pain or discomfort. Pt. will continue to benefit from skilled PT intervention to improve right shoulder strength and motion to improve ability to perform functional ADL's within the limits of the surgical protocol.     Clinical Presentation  Evolving    Clinical Decision Making  Moderate    Rehab Potential  Good    Clinical Impairments Affecting Rehab Potential  positive: motivation, family support, negative: co-morbidities, deconditioned, high fall risk; Body structures that will need to be addressed: weakness, numbness/tingling, impaired balance, difficulty walking, decreased transfer ability, etc; Due to co-morbidities her clinical presenstation is evolving;     PT Frequency  2x / week    PT Duration  8 weeks    PT Treatment/Interventions  Cryotherapy;Electrical Stimulation;Ultrasound;Moist Heat;Gait training;Functional mobility training;Therapeutic activities;Therapeutic exercise;Neuromuscular re-education;Scar mobilization    PT Next Visit Plan  UE strneghening and mobility within surgical precaution guidelines     PT Home Exercise Plan  See prescribed HEP.     Consulted and Agree with Plan of Care  Patient;Family member/caregiver       Patient will benefit from skilled therapeutic intervention in order to improve the following deficits and impairments:  Decreased endurance, Decreased range of motion, Decreased skin integrity, Decreased strength, Hypomobility, Impaired UE functional use, Increased fascial restricitons, Pain, Postural dysfunction, Impaired  flexibility, Decreased scar mobility, Decreased mobility  Visit Diagnosis: S/P reverse total shoulder arthroplasty, right  Stiffness of right shoulder, not elsewhere classified  Chronic right shoulder pain  Muscle weakness of right upper extremity     Problem List Patient Active Problem List   Diagnosis Date Noted  . S/P reverse total shoulder arthroplasty, 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  . Chronic diastolic heart failure ([place]) 08/09/2016  . COPD (chronic obstructive pulmonary disease) ([place]) 08/09/2016  . Pulmonary infiltrates   . Pneumonia due to Streptococcus pneumoniae ([place])   . Fever   . Community acquired pneumonia of right lung   . Palliative care by specialist   . Goals of care, counseling/discussion   . Pressure injury of skin 07/03/2016  . COPD exacerbation ([place]) 07/21/2015  . Acute renal insufficiency 07/21/2015  . Hyperglycemia 07/21/2015  . Benign essential HTN 07/21/2015  . S/P shoulder replacement 07/02/2013   Pura Spice, PT, DPT # [place], SPT 05/13/2018, 11:50 AM  [facility] [address] [place], [name], [address] Phone: [phone]   Fax:  [phone]  Name: Vance Belcourt MRN: [id number] Date of Birth: Mar 19, 1946

## 2018-05-15 ENCOUNTER — Ambulatory Visit: Payer: Medicare HMO | Admitting: Physical Therapy

## 2018-05-15 ENCOUNTER — Encounter: Payer: Self-pay | Admitting: Physical Therapy

## 2018-05-15 DIAGNOSIS — M6281 Muscle weakness (generalized): Secondary | ICD-10-CM | POA: Diagnosis not present

## 2018-05-15 DIAGNOSIS — G8929 Other chronic pain: Secondary | ICD-10-CM | POA: Diagnosis not present

## 2018-05-15 DIAGNOSIS — Z96611 Presence of right artificial shoulder joint: Secondary | ICD-10-CM | POA: Diagnosis not present

## 2018-05-15 DIAGNOSIS — M25611 Stiffness of right shoulder, not elsewhere classified: Secondary | ICD-10-CM | POA: Diagnosis not present

## 2018-05-15 DIAGNOSIS — M25511 Pain in right shoulder: Secondary | ICD-10-CM | POA: Diagnosis not present

## 2018-05-15 NOTE — Therapy (Signed)
[facility] [address] [place], [name], [address] Phone: [phone]   Fax:  [phone]  Physical Therapy Treatment  Patient Details  Name: Hannah Powell MRN: [id number] Date of Birth: [birth year] No data recorded  Encounter Date: 05/15/2018  PT End of Session - 05/16/18 0837    Visit Number  4    Number of Visits  16    Date for PT Re-Evaluation  06/25/18    Authorization - Visit Number  4    Authorization - Number of Visits  10    PT Start Time  7253    PT Stop Time  1730    PT Time Calculation (min)  45 min    Equipment Utilized During Treatment  Gait belt    Activity Tolerance  Patient tolerated treatment well;Patient limited by pain    Behavior During Therapy  WFL for tasks assessed/performed       Past Medical History:  Diagnosis Date  . Anxiety   . Arthritis   . CHF (congestive heart failure) ([place])   . COPD (chronic obstructive pulmonary disease) ([place])   . Edema    feet/legs  . Hypertension    dr Otho Najjar     . Hypothyroidism   . Leg weakness, bilateral   . Osteoporosis   . Oxygen deficit    2l with bipap at night  . RLS (restless legs syndrome)   . Shortness of breath   . Sleep apnea    bipap  . Wheezing     Past Surgical History:  Procedure Laterality Date  . BACK SURGERY     cervical  . BREAST BIOPSY Left    needle bx-neg  . CATARACT EXTRACTION W/PHACO Left 05/07/2017   Procedure: CATARACT EXTRACTION PHACO AND INTRAOCULAR LENS PLACEMENT (IOC);  Surgeon: Birder Robson, MD;  Location: [facility];  Service: Ophthalmology;  Laterality: Left;  [place] 00:48.7 AP% 11.3 CDE 5.46 Fluid Pack Lot # [id number]  . CATARACT EXTRACTION W/PHACO Right 04/23/2017   Procedure: CATARACT EXTRACTION PHACO AND INTRAOCULAR LENS PLACEMENT ([place]);  Surgeon: Birder Robson, MD;  Location: [facility];  Service: Ophthalmology;  Laterality: Right;  [place] 00:29 AP% 12.8 CDE 3.79 FLUID PACK LOT # [id number]  . FOOT ARTHROPLASTY     . JOINT REPLACEMENT     x2 tkr  . TOTAL SHOULDER ARTHROPLASTY Left 07/02/2013   Procedure: LEFT TOTAL SHOULDER ARTHROPLASTY;  Surgeon: Marin Shutter, MD;  Location: [place];  Service: Orthopedics;  Laterality: Left;  . TOTAL SHOULDER ARTHROPLASTY Right 04/10/2018   Procedure: TOTAL REVERSE SHOULDER ARTHROPLASTY;  Surgeon: Justice Britain, MD;  [facility];  Service: Orthopedics;  Laterality: Right;  135min    There were no vitals filed for this visit.  Subjective Assessment - 05/15/18 1656    Subjective  Pt. entered clinic today with no complaints of pain. Pt. reports following her HEP without issue and is able to sleep through the night without being woke from pain.     Pertinent History  extensive PMH including intensive care unit stays, rehab facility stays, previously ambulated with RW, is now using quad cane due to shoulder precautions. COPD, SOB, HTN. Denies CHF, reported she has been cleared by a cardiologist.    Limitations  Walking;Lifting;Standing;Writing;House hold activities    Currently in Pain?  No/denies       Therex:   Seated UBE bilat arms for 6 min alternating frw/bkw each min.  Standing right shoulder AAROM flexion 2x15 (PT provided  mod verbal cueing to maintain upright posture)  Standing right shoulder AAROM ER 2x15 (Pt. Was able to achieve ER without increase in pain today)  Standing right shoulder AAROM Abduction 2x15 Standing right shoulder abduction isometrics 1x10 with 10 sec holds  Standing right shoulder adduction isometrics 1x10 with 10 sec (PT provided mod verbal cueing for pt. to use less force with adduction compared to other motions) Standing right shoulder ER isometric 1x10 with 10 sec holds  Standing right shoulder flexion isometric 1x10 sec holds        PT Short Term Goals - 05/01/18 1612      PT SHORT TERM GOAL #1   Title  Pt. will increase AAROM with ER to 30 degrees in neutral  without increase in pain to regain independent self-care  tasks.     Baseline  10degs on evaluation    Time  4    Period  Weeks    Status  New    Target Date  05/28/18        PT Long Term Goals - 05/01/18 1257      PT LONG TERM GOAL #1   Title  Pt. will improve FOTO to 59 to demonstrate improved independence with functional ADL's and decrease pain.     Baseline  Pt. currently scored a 19/59 (04/30/18).    Time  4    Period  Weeks    Status  New    Target Date  05/28/18      PT LONG TERM GOAL #2   Title  --    Baseline  --    Time  --    Period  --    Status  --      PT LONG TERM GOAL #3   Title  Pt. will achieve 110 degrees of AAROM/AROM of shoulder flexion to reach overhead and complete cooking and cleaning task around the house.    Baseline  Pt. currently has 60 degrees of PROM of right shoulder.     Time  4    Period  Weeks    Status  New    Target Date  05/28/18      PT LONG TERM GOAL #4   Title  Pt. will be able to complete HEP and daily activities with less than 4/10 pain to indicate and improved ability to perform functional activities.    Baseline  Pt. currently experiences 8/10 NPS occasionally after completing HEP.     Time  4    Period  Weeks    Status  New    Target Date  05/28/18      PT LONG TERM GOAL #5   Title  The patient will demonstrate MMT grade of RUE of a least 4/5 to indicate an improved ability to perform functional activities.    Baseline  unable to MMT due to procotol on evaluation    Time  8    Period  Weeks    Status  New    Target Date  06/27/18      PT LONG TERM GOAL #6   Title  The patient will demonstrate AROM of R shoulder WFLs per reverse total shoulder to demonstrate improved functional activities.     Baseline  unable per protocol on evaluation.    Time  8    Period  Weeks    Status  New          Plan - 05/16/18 1638    Clinical Impression Statement  Pt. entered clinic with excellent motivation to participate in therapy. Pt. was limited partially due to surgical restrictions  and inability to stand for long periods of time. Pt. also remains somewhat limited with right shoulder ER but does not have pain with AAROM of ER. Pt. demonstrated excellent strength with isometrics and was able to utilize the UBE for warm-up without increase in pain today. Pt. tolerated AAROM and isometric therx in standing today but needed to rest occasionally secondary to weakness of LE. PT provides mod verbal cueing for pt. to stay within surgical precautions as well as maintain upright head/thoracic posture and keeping the shoulders depressed. Pt. will continue to benefit from skilled PT intervention to progress shoulder rehab to return to functional ADL's without increase in pain.     Clinical Presentation  Evolving    Clinical Decision Making  Moderate    Rehab Potential  Good    Clinical Impairments Affecting Rehab Potential  positive: motivation, family support, negative: co-morbidities, deconditioned, high fall risk; Body structures that will need to be addressed: weakness, numbness/tingling, impaired balance, difficulty walking, decreased transfer ability, etc; Due to co-morbidities her clinical presenstation is evolving;     PT Frequency  2x / week    PT Duration  8 weeks    PT Treatment/Interventions  Cryotherapy;Electrical Stimulation;Ultrasound;Moist Heat;Gait training;Functional mobility training;Therapeutic activities;Therapeutic exercise;Neuromuscular re-education;Scar mobilization    PT Next Visit Plan  UE strneghening and mobility within surgical precaution guidelines     PT Home Exercise Plan  See prescribed HEP.        Patient will benefit from skilled therapeutic intervention in order to improve the following deficits and impairments:  Decreased endurance, Decreased range of motion, Decreased skin integrity, Decreased strength, Hypomobility, Impaired UE functional use, Increased fascial restricitons, Pain, Postural dysfunction, Impaired flexibility, Decreased scar mobility,  Decreased mobility  Visit Diagnosis: S/P reverse total shoulder arthroplasty, right  Stiffness of right shoulder, not elsewhere classified  Chronic right shoulder pain  Muscle weakness of right upper extremity  Muscle weakness (generalized)     Problem List Patient Active Problem List   Diagnosis Date Noted  . S/P reverse total shoulder arthroplasty, 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  . Chronic diastolic heart failure ([place]) 08/09/2016  . COPD (chronic obstructive pulmonary disease) ([place]) 08/09/2016  . Pulmonary infiltrates   . Pneumonia due to Streptococcus pneumoniae ([place])   . Fever   . Community acquired pneumonia of right lung   . Palliative care by specialist   . Goals of care, counseling/discussion   . Pressure injury of skin 07/03/2016  . COPD exacerbation ([place]) 07/21/2015  . Acute renal insufficiency 07/21/2015  . Hyperglycemia 07/21/2015  . Benign essential HTN 07/21/2015  . S/P shoulder replacement 07/02/2013   Pura Spice, PT, DPT # [place], SPT 05/16/2018, 3:04 PM  [place] [facility] [address] [place], [name], [address] Phone: [phone]   Fax:  [phone]  Name: Hannah Powell MRN: [id number] Date of Birth: [birth year]

## 2018-05-16 DIAGNOSIS — J441 Chronic obstructive pulmonary disease with (acute) exacerbation: Secondary | ICD-10-CM | POA: Diagnosis not present

## 2018-05-16 DIAGNOSIS — J9620 Acute and chronic respiratory failure, unspecified whether with hypoxia or hypercapnia: Secondary | ICD-10-CM | POA: Diagnosis not present

## 2018-05-16 DIAGNOSIS — J449 Chronic obstructive pulmonary disease, unspecified: Secondary | ICD-10-CM | POA: Diagnosis not present

## 2018-05-16 DIAGNOSIS — Z96611 Presence of right artificial shoulder joint: Secondary | ICD-10-CM | POA: Diagnosis not present

## 2018-05-16 DIAGNOSIS — I5032 Chronic diastolic (congestive) heart failure: Secondary | ICD-10-CM | POA: Diagnosis not present

## 2018-05-16 DIAGNOSIS — J9601 Acute respiratory failure with hypoxia: Secondary | ICD-10-CM | POA: Diagnosis not present

## 2018-05-17 DIAGNOSIS — J449 Chronic obstructive pulmonary disease, unspecified: Secondary | ICD-10-CM | POA: Diagnosis not present

## 2018-05-19 ENCOUNTER — Encounter: Payer: Self-pay | Admitting: Physical Therapy

## 2018-05-19 ENCOUNTER — Ambulatory Visit: Payer: Medicare HMO | Attending: Orthopedic Surgery | Admitting: Physical Therapy

## 2018-05-19 DIAGNOSIS — M6281 Muscle weakness (generalized): Secondary | ICD-10-CM | POA: Diagnosis not present

## 2018-05-19 DIAGNOSIS — M25511 Pain in right shoulder: Secondary | ICD-10-CM | POA: Diagnosis not present

## 2018-05-19 DIAGNOSIS — M25611 Stiffness of right shoulder, not elsewhere classified: Secondary | ICD-10-CM | POA: Diagnosis not present

## 2018-05-19 DIAGNOSIS — G8929 Other chronic pain: Secondary | ICD-10-CM | POA: Diagnosis not present

## 2018-05-19 DIAGNOSIS — Z96611 Presence of right artificial shoulder joint: Secondary | ICD-10-CM | POA: Insufficient documentation

## 2018-05-19 NOTE — Therapy (Signed)
[facility] [address]. [place], [name], [address] Phone: [phone]   Fax:  [phone]  Physical Therapy Treatment  Patient Details  Name: Hannah Powell MRN: [id number] Date of Birth: 05-23-1945 No data recorded  Encounter Date: 05/19/2018  PT End of Session - 05/19/18 1807    Visit Number  5    Number of Visits  16    Date for PT Re-Evaluation  06/25/18    Authorization - Visit Number  5    Authorization - Number of Visits  10    PT Start Time  4765    PT Stop Time  1527    PT Time Calculation (min)  59 min    Activity Tolerance  Patient tolerated treatment well    Behavior During Therapy  [facility] for tasks assessed/performed       Past Medical History:  Diagnosis Date  . Anxiety   . Arthritis   . CHF (congestive heart failure) ([place])   . COPD (chronic obstructive pulmonary disease) ([place])   . Edema    feet/legs  . Hypertension    dr Otho Najjar     . Hypothyroidism   . Leg weakness, bilateral   . Osteoporosis   . Oxygen deficit    2l with bipap at night  . RLS (restless legs syndrome)   . Shortness of breath   . Sleep apnea    bipap  . Wheezing     Past Surgical History:  Procedure Laterality Date  . BACK SURGERY     cervical  . BREAST BIOPSY Left    needle bx-neg  . CATARACT EXTRACTION W/PHACO Left 05/07/2017   Procedure: CATARACT EXTRACTION PHACO AND INTRAOCULAR LENS PLACEMENT (IOC);  Surgeon: Birder Robson, MD;  Location: [facility];  Service: Ophthalmology;  Laterality: Left;  [place] 00:48.7 AP% 11.3 CDE 5.46 Fluid Pack Lot # [id number]  . CATARACT EXTRACTION W/PHACO Right 04/23/2017   Procedure: CATARACT EXTRACTION PHACO AND INTRAOCULAR LENS PLACEMENT ([place]);  Surgeon: Birder Robson, MD;  Location: [facility];  Service: Ophthalmology;  Laterality: Right;  [place] 00:29 AP% 12.8 CDE 3.79 FLUID PACK LOT # [id number]  . FOOT ARTHROPLASTY    . JOINT REPLACEMENT     x2 tkr  . TOTAL SHOULDER ARTHROPLASTY Left  07/02/2013   Procedure: LEFT TOTAL SHOULDER ARTHROPLASTY;  Surgeon: Marin Shutter, MD;  Location: [place];  Service: Orthopedics;  Laterality: Left;  . TOTAL SHOULDER ARTHROPLASTY Right 04/10/2018   Procedure: TOTAL REVERSE SHOULDER ARTHROPLASTY;  Surgeon: Justice Britain, MD;  Location: [facility];  Service: Orthopedics;  Laterality: Right;  111min    There were no vitals filed for this visit.  Subjective Assessment - 05/19/18 1440    Subjective  Pt. entered PT with no c/o R shoulder pain.  Pt. trying to use R UE with more daily tasks (ie. loading dishwasher).      Pertinent History  extensive PMH including intensive care unit stays, rehab facility stays, previously ambulated with RW, is now using quad cane due to shoulder precautions. COPD, SOB, HTN. Denies CHF, reported she has been cleared by a cardiologist.    Limitations  Walking;Lifting;Standing;Writing;House hold activities    Patient Stated Goals  Increase R shoulder ROM/ strength    Currently in Pain?  No/denies          Therex:   UBE bilat arms for 4 min alternating frw/bkw each min. Fatigue reported/ noted.  Standing right shoulder AAROM: flexion, abduction, ER 2x15 (PT  provided mod verbal cueing to maintain upright posture)  Seated right shoulder abduction, ER, flexion manual isometrics 1x10 with 10 sec holds  Standing wall ladder flexion flexion 5x (marked with sticker)- L/R similar Standing 2nd shelf: cone stacking, added 1.5# wrist wt.  Seated R shoulder A/AROM 10x (all planes).   Reviewed HEP/ discussed MD protocol    PT Short Term Goals - 05/01/18 1612      PT SHORT TERM GOAL #1   Title  Pt. will increase AAROM with ER to 30 degrees in neutral  without increase in pain to regain independent self-care tasks.     Baseline  10degs on evaluation    Time  4    Period  Weeks    Status  New    Target Date  05/28/18        PT Long Term Goals - 05/01/18 1257      PT LONG TERM GOAL #1   Title  Pt. will improve FOTO to  59 to demonstrate improved independence with functional ADL's and decrease pain.     Baseline  Pt. currently scored a 19/59 (04/30/18).    Time  4    Period  Weeks    Status  New    Target Date  05/28/18      PT LONG TERM GOAL #2   Title  --    Baseline  --    Time  --    Period  --    Status  --      PT LONG TERM GOAL #3   Title  Pt. will achieve 110 degrees of AAROM/AROM of shoulder flexion to reach overhead and complete cooking and cleaning task around the house.    Baseline  Pt. currently has 60 degrees of PROM of right shoulder.     Time  4    Period  Weeks    Status  New    Target Date  05/28/18      PT LONG TERM GOAL #4   Title  Pt. will be able to complete HEP and daily activities with less than 4/10 pain to indicate and improved ability to perform functional activities.    Baseline  Pt. currently experiences 8/10 NPS occasionally after completing HEP.     Time  4    Period  Weeks    Status  New    Target Date  05/28/18      PT LONG TERM GOAL #5   Title  The patient will demonstrate MMT grade of RUE of a least 4/5 to indicate an improved ability to perform functional activities.    Baseline  unable to MMT due to procotol on evaluation    Time  8    Period  Weeks    Status  New    Target Date  06/27/18      PT LONG TERM GOAL #6   Title  The patient will demonstrate AROM of R shoulder WFLs per reverse total shoulder to demonstrate improved functional activities.     Baseline  unable per protocol on evaluation.    Time  8    Period  Weeks    Status  New            Plan - 05/19/18 1808    Clinical Impression Statement  Pt. was interested in trying to ambulate with use of hemiwalker insteaded of [facility].  Pt. feels more secure/ safer with HW and may purchase for home/ community use.  Pt.  demonstrates functional R shoulder AROM (flexion/ abduction) and progressing well with resisted sh. flexion/ abd./ ER with no c/o pain.  Pt. instructed to attempt to don/ doff  pants with use of R UE prior to next tx. session.      Stability/Clinical Decision Making  Evolving/Moderate complexity    Clinical Decision Making  Moderate    Rehab Potential  Good    Clinical Impairments Affecting Rehab Potential  positive: motivation, family support, negative: co-morbidities, deconditioned, high fall risk; Body structures that will need to be addressed: weakness, numbness/tingling, impaired balance, difficulty walking, decreased transfer ability, etc; Due to co-morbidities her clinical presenstation is evolving;     PT Frequency  2x / week    PT Duration  8 weeks    PT Treatment/Interventions  Cryotherapy;Electrical Stimulation;Ultrasound;Moist Heat;Gait training;Functional mobility training;Therapeutic activities;Therapeutic exercise;Neuromuscular re-education;Scar mobilization    PT Next Visit Plan  UE strneghening and mobility within surgical precaution guidelines     PT Home Exercise Plan  See prescribed HEP.     Consulted and Agree with Plan of Care  Patient;Family member/caregiver       Patient will benefit from skilled therapeutic intervention in order to improve the following deficits and impairments:  Decreased endurance, Decreased range of motion, Decreased skin integrity, Decreased strength, Hypomobility, Impaired UE functional use, Increased fascial restricitons, Pain, Postural dysfunction, Impaired flexibility, Decreased scar mobility, Decreased mobility  Visit Diagnosis: S/P reverse total shoulder arthroplasty, right  Stiffness of right shoulder, not elsewhere classified  Chronic right shoulder pain  Muscle weakness of right upper extremity     Problem List Patient Active Problem List   Diagnosis Date Noted  . S/P reverse total shoulder arthroplasty, 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  . Chronic diastolic heart failure ([place]) 08/09/2016  . COPD (chronic obstructive pulmonary disease) ([place]) 08/09/2016  . Pulmonary infiltrates   . Pneumonia due to Streptococcus  pneumoniae ([place])   . Fever   . Community acquired pneumonia of right lung   . Palliative care by specialist   . Goals of care, counseling/discussion   . Pressure injury of skin 07/03/2016  . COPD exacerbation ([place]) 07/21/2015  . Acute renal insufficiency 07/21/2015  . Hyperglycemia 07/21/2015  . Benign essential HTN 07/21/2015  . S/P shoulder replacement 07/02/2013   Pura Spice, PT, DPT # [phone] 05/19/2018, 6:50 PM  [facility] [address] [place], [name], [address] Phone: [phone]   Fax:  [phone]  Name: Hannah Powell MRN: [id number] Date of Birth: 09/10/1945

## 2018-05-21 ENCOUNTER — Encounter: Payer: Self-pay | Admitting: Physical Therapy

## 2018-05-21 ENCOUNTER — Ambulatory Visit: Payer: Medicare HMO | Admitting: Physical Therapy

## 2018-05-21 DIAGNOSIS — G8929 Other chronic pain: Secondary | ICD-10-CM | POA: Diagnosis not present

## 2018-05-21 DIAGNOSIS — Z96611 Presence of right artificial shoulder joint: Secondary | ICD-10-CM

## 2018-05-21 DIAGNOSIS — M6281 Muscle weakness (generalized): Secondary | ICD-10-CM | POA: Diagnosis not present

## 2018-05-21 DIAGNOSIS — M25611 Stiffness of right shoulder, not elsewhere classified: Secondary | ICD-10-CM

## 2018-05-21 DIAGNOSIS — M25511 Pain in right shoulder: Secondary | ICD-10-CM

## 2018-05-21 NOTE — Therapy (Signed)
[facility] [address]. [place], [name], [address] Phone: [phone]   Fax:  [phone]  Physical Therapy Treatment  Patient Details  Name: Hannah Powell MRN: [id number] Date of Birth: February 02, 1946 No data recorded  Encounter Date: 05/21/2018   Treatment: 6 of 16.  Recert date: 2/0/2542 7062 to 3762   Past Medical History:  Diagnosis Date  . Anxiety   . Arthritis   . CHF (congestive heart failure) ([place])   . COPD (chronic obstructive pulmonary disease) ([place])   . Edema    feet/legs  . Hypertension    dr Otho Najjar     . Hypothyroidism   . Leg weakness, bilateral   . Osteoporosis   . Oxygen deficit    2l with bipap at night  . RLS (restless legs syndrome)   . Shortness of breath   . Sleep apnea    bipap  . Wheezing     Past Surgical History:  Procedure Laterality Date  . BACK SURGERY     cervical  . BREAST BIOPSY Left    needle bx-neg  . CATARACT EXTRACTION W/PHACO Left 05/07/2017   Procedure: CATARACT EXTRACTION PHACO AND INTRAOCULAR LENS PLACEMENT (IOC);  Surgeon: Birder Robson, MD;  Location: [facility];  Service: Ophthalmology;  Laterality: Left;  [place] 00:48.7 AP% 11.3 CDE 5.46 Fluid Pack Lot # [id number]  . CATARACT EXTRACTION W/PHACO Right 04/23/2017   Procedure: CATARACT EXTRACTION PHACO AND INTRAOCULAR LENS PLACEMENT ([place]);  Surgeon: Birder Robson, MD;  Location: [facility];  Service: Ophthalmology;  Laterality: Right;  [place] 00:29 AP% 12.8 CDE 3.79 FLUID PACK LOT # [id number]  . FOOT ARTHROPLASTY    . JOINT REPLACEMENT     x2 tkr  . TOTAL SHOULDER ARTHROPLASTY Left 07/02/2013   Procedure: LEFT TOTAL SHOULDER ARTHROPLASTY;  Surgeon: Marin Shutter, MD;  Location: [place];  Service: Orthopedics;  Laterality: Left;  . TOTAL SHOULDER ARTHROPLASTY Right 04/10/2018   Procedure: TOTAL REVERSE SHOULDER ARTHROPLASTY;  Surgeon: Justice Britain, MD;  Location[facility];  Service: Orthopedics;  Laterality: Right;  169min     There were no vitals filed for this visit.    Pt. reports no R shoulder pain prior to PT tx. session. Pt. states she prefers using hemiwalker as compared to [facility] after trying the [facility] last tx. session. Pt. is planning to buy the [facility] this weekend.       Therex:  UBE bilat arms for 4 min alternating frw/bkw each min. Fatigue reported/ noted. Standing right shoulder AAROM: flexion, abduction, ER2x15 (PT provided mod verbal cueing to maintain upright posture)with weighted wand ex.  Seated right shoulder abduction, ER, flexion manual isometrics1x10 with 10 sec holds Standing wall ladder flexion flexion/ abduction 5x (marked with sticker)- L/R similar Nautilus: 30# seated lat. Pull downs/ 20# tricep extension/ 20# sh. Extension (elbows ext.)/  20x each.  Seated R shoulder A/AROM 10x (all planes).   See MD protocol   PT Short Term Goals - 05/01/18 1612      PT SHORT TERM GOAL #1   Title  Pt. will increase AAROM with ER to 30 degrees in neutral  without increase in pain to regain independent self-care tasks.     Baseline  10degs on evaluation    Time  4    Period  Weeks    Status  New    Target Date  05/28/18        PT Long Term Goals - 05/01/18 1257  PT LONG TERM GOAL #1   Title  Pt. will improve FOTO to 59 to demonstrate improved independence with functional ADL's and decrease pain.     Baseline  Pt. currently scored a 19/59 (04/30/18).    Time  4    Period  Weeks    Status  New    Target Date  05/28/18      PT LONG TERM GOAL #2   Title  --    Baseline  --    Time  --    Period  --    Status  --      PT LONG TERM GOAL #3   Title  Pt. will achieve 110 degrees of AAROM/AROM of shoulder flexion to reach overhead and complete cooking and cleaning task around the house.    Baseline  Pt. currently has 60 degrees of PROM of right shoulder.     Time  4    Period  Weeks    Status  New    Target Date  05/28/18      PT LONG TERM GOAL #4   Title  Pt. will be able  to complete HEP and daily activities with less than 4/10 pain to indicate and improved ability to perform functional activities.    Baseline  Pt. currently experiences 8/10 NPS occasionally after completing HEP.     Time  4    Period  Weeks    Status  New    Target Date  05/28/18      PT LONG TERM GOAL #5   Title  The patient will demonstrate MMT grade of RUE of a least 4/5 to indicate an improved ability to perform functional activities.    Baseline  unable to MMT due to procotol on evaluation    Time  8    Period  Weeks    Status  New    Target Date  06/27/18      PT LONG TERM GOAL #6   Title  The patient will demonstrate AROM of R shoulder WFLs per reverse total shoulder to demonstrate improved functional activities.     Baseline  unable per protocol on evaluation.    Time  8    Period  Weeks    Status  New        Pt. able to pull up pants with no R shoulder pain or limitations. Pt. continues to demonstrate functional R shoulder ROM per MD protocol. Pt. progressing well with R sh. strengthening ex. and will continue with current HEP. Pt. instructed to use R UE more and more with daily household/ functional tasks.       Patient will benefit from skilled therapeutic intervention in order to improve the following deficits and impairments:  Decreased endurance, Decreased range of motion, Decreased skin integrity, Decreased strength, Hypomobility, Impaired UE functional use, Increased fascial restricitons, Pain, Postural dysfunction, Impaired flexibility, Decreased scar mobility, Decreased mobility  Visit Diagnosis: S/P reverse total shoulder arthroplasty, right  Stiffness of right shoulder, not elsewhere classified  Chronic right shoulder pain  Muscle weakness of right upper extremity     Problem List Patient Active Problem List   Diagnosis Date Noted  . S/P reverse total shoulder arthroplasty, 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  . Chronic diastolic heart failure ([place]) 08/09/2016  .  COPD (chronic obstructive pulmonary disease) ([place]) 08/09/2016  . Pulmonary infiltrates   . Pneumonia due to Streptococcus pneumoniae ([place])   . Fever   . Community acquired pneumonia of right lung   .  Palliative care by specialist   . Goals of care, counseling/discussion   . Pressure injury of skin 07/03/2016  . COPD exacerbation ([place]) 07/21/2015  . Acute renal insufficiency 07/21/2015  . Hyperglycemia 07/21/2015  . Benign essential HTN 07/21/2015  . S/P shoulder replacement 07/02/2013   Pura Spice, PT, DPT # [phone] 05/25/2018, 8:33 AM  [facility] [address] [place], [name], [address] Phone: [phone]   Fax:  [phone]  Name: Hannah Powell MRN: [id number] Date of Birth: 05-10-1945

## 2018-05-23 DIAGNOSIS — G4733 Obstructive sleep apnea (adult) (pediatric): Secondary | ICD-10-CM | POA: Diagnosis not present

## 2018-05-23 DIAGNOSIS — I5032 Chronic diastolic (congestive) heart failure: Secondary | ICD-10-CM | POA: Diagnosis not present

## 2018-05-23 DIAGNOSIS — Z79899 Other long term (current) drug therapy: Secondary | ICD-10-CM | POA: Diagnosis not present

## 2018-05-23 DIAGNOSIS — E7800 Pure hypercholesterolemia, unspecified: Secondary | ICD-10-CM | POA: Diagnosis not present

## 2018-05-23 DIAGNOSIS — I10 Essential (primary) hypertension: Secondary | ICD-10-CM | POA: Diagnosis not present

## 2018-05-23 DIAGNOSIS — E034 Atrophy of thyroid (acquired): Secondary | ICD-10-CM | POA: Diagnosis not present

## 2018-05-26 ENCOUNTER — Ambulatory Visit: Payer: Medicare HMO | Admitting: Physical Therapy

## 2018-05-26 ENCOUNTER — Encounter: Payer: Self-pay | Admitting: Physical Therapy

## 2018-05-26 DIAGNOSIS — Z96611 Presence of right artificial shoulder joint: Secondary | ICD-10-CM | POA: Diagnosis not present

## 2018-05-26 DIAGNOSIS — M25511 Pain in right shoulder: Secondary | ICD-10-CM

## 2018-05-26 DIAGNOSIS — G8929 Other chronic pain: Secondary | ICD-10-CM

## 2018-05-26 DIAGNOSIS — M25611 Stiffness of right shoulder, not elsewhere classified: Secondary | ICD-10-CM

## 2018-05-26 DIAGNOSIS — M6281 Muscle weakness (generalized): Secondary | ICD-10-CM | POA: Diagnosis not present

## 2018-05-26 NOTE — Patient Instructions (Signed)
Access Code: [email or web address]  URL: [email or web address]  Date: 05/26/2018  Prepared by: Dorcas Carrow   Exercises  Standing Single Arm Elbow Flexion with Resistance - 20 reps - 1 sets - 1x daily - 7x weekly  Shoulder External Rotation with Resistance - 20 reps - 1 sets - 1x daily - 7x weekly  Standing Shoulder Flexion with Resistance - 20 reps - 1 sets - 1x daily - 7x weekly

## 2018-05-26 NOTE — Therapy (Signed)
[place] [facility] [address]. [place], [name], [address] Phone: [phone]   Fax:  [phone]  Physical Therapy Treatment  Patient Details  Name: Hannah Powell MRN: [id number] Date of Birth: [birth year] No data recorded  Encounter Date: 05/26/2018  PT End of Session - 05/26/18 1244    Visit Number  7    Number of Visits  16    Date for PT Re-Evaluation  06/25/18    Authorization - Visit Number  7    Authorization - Number of Visits  10    PT Start Time  1244    PT Stop Time  1339    PT Time Calculation (min)  55 min    Activity Tolerance  Patient tolerated treatment well    Behavior During Therapy  [facility] for tasks assessed/performed       Past Medical History:  Diagnosis Date  . Anxiety   . Arthritis   . CHF (congestive heart failure) ([place])   . COPD (chronic obstructive pulmonary disease) ([place])   . Edema    feet/legs  . Hypertension    dr Otho Najjar     . Hypothyroidism   . Leg weakness, bilateral   . Osteoporosis   . Oxygen deficit    2l with bipap at night  . RLS (restless legs syndrome)   . Shortness of breath   . Sleep apnea    bipap  . Wheezing     Past Surgical History:  Procedure Laterality Date  . BACK SURGERY     cervical  . BREAST BIOPSY Left    needle bx-neg  . CATARACT EXTRACTION W/PHACO Left 05/07/2017   Procedure: CATARACT EXTRACTION PHACO AND INTRAOCULAR LENS PLACEMENT (IOC);  Surgeon: Birder Robson, MD;  Location: [facility];  Service: Ophthalmology;  Laterality: Left;  [place] 00:48.7 AP% 11.3 CDE 5.46 Fluid Pack Lot # [id number]  . CATARACT EXTRACTION W/PHACO Right 04/23/2017   Procedure: CATARACT EXTRACTION PHACO AND INTRAOCULAR LENS PLACEMENT ([place]);  Surgeon: Birder Robson, MD;  Location: [facility];  Service: Ophthalmology;  Laterality: Right;  [place] 00:29 AP% 12.8 CDE 3.79 FLUID PACK LOT # [id number]  . FOOT ARTHROPLASTY    . JOINT REPLACEMENT     x2 tkr  . TOTAL SHOULDER ARTHROPLASTY Left  07/02/2013   Procedure: LEFT TOTAL SHOULDER ARTHROPLASTY;  Surgeon: Marin Shutter, MD;  Location: [place];  Service: Orthopedics;  Laterality: Left;  . TOTAL SHOULDER ARTHROPLASTY Right 04/10/2018   Procedure: TOTAL REVERSE SHOULDER ARTHROPLASTY;  Surgeon: Justice Britain, MD;  [facility];  Service: Orthopedics;  Laterality: Right;  118min    There were no vitals filed for this visit.  Subjective Assessment - 05/26/18 1244    Subjective  Pt. entered PT with use of new HW.  Pt. reports no R shoulder pain prior to PT tx. session.      Pertinent History  extensive PMH including intensive care unit stays, rehab facility stays, previously ambulated with RW, is now using quad cane due to shoulder precautions. COPD, SOB, HTN. Denies CHF, reported she has been cleared by a cardiologist.    Limitations  Walking;Lifting;Standing;Writing;House hold activities    How long can you sit comfortably?  unlimited    How long can you stand comfortably?  3-4 hours    How long can you walk comfortably?  3-4 hours    Diagnostic tests  last x-rays of left knee where in November 2016 which looked good;     Patient  Stated Goals  Increase R shoulder ROM/ strength    Currently in Pain?  No/denies         FOTO: initial 19/ today 56/ goal 59    Therex:  UBE bilat arms for45min alternating frw/bkw each min. Cuing for upright posture/ min. A to get on and off UBE. Standing right shoulder AROM:flexion, abduction, ER 10x (PT provided mod verbal cueing to maintain upright posture)with mirror feedback.   Seatedright shoulder abduction, ER, flexion manualisometrics1x10 with 10 sec holds Standing functional reaching (cones on elevated table) with vary positions 4x (12 cones).   Nautilus: 30# seated lat. Pull downs/ 20# tricep extension/ 20# sh. Extension (elbows ext.)/ scap. Retraction 30# 20x each.  See new HEP (issued RTB).    See MD protocol  Placed ice pack on R shoulder in seated position after tx.  For 5 min.  (pt. Very sensitive to cold).     PT Education - 05/26/18 1943    Education provided  Yes    Education Details  See HEP (issued RTB).  Pt. already has YTB at home.     Person(s) Educated  Patient;Spouse    Methods  Explanation;Demonstration;Handout    Comprehension  Verbalized understanding;Returned demonstration       PT Short Term Goals - 05/01/18 1612      PT SHORT TERM GOAL #1   Title  Pt. will increase AAROM with ER to 30 degrees in neutral  without increase in pain to regain independent self-care tasks.     Baseline  10degs on evaluation    Time  4    Period  Weeks    Status  New    Target Date  05/28/18        PT Long Term Goals - 05/01/18 1257      PT LONG TERM GOAL #1   Title  Pt. will improve FOTO to 59 to demonstrate improved independence with functional ADL's and decrease pain.     Baseline  Pt. currently scored a 19/59 (04/30/18).    Time  4    Period  Weeks    Status  New    Target Date  05/28/18      PT LONG TERM GOAL #2   Title  --    Baseline  --    Time  --    Period  --    Status  --      PT LONG TERM GOAL #3   Title  Pt. will achieve 110 degrees of AAROM/AROM of shoulder flexion to reach overhead and complete cooking and cleaning task around the house.    Baseline  Pt. currently has 60 degrees of PROM of right shoulder.     Time  4    Period  Weeks    Status  New    Target Date  05/28/18      PT LONG TERM GOAL #4   Title  Pt. will be able to complete HEP and daily activities with less than 4/10 pain to indicate and improved ability to perform functional activities.    Baseline  Pt. currently experiences 8/10 NPS occasionally after completing HEP.     Time  4    Period  Weeks    Status  New    Target Date  05/28/18      PT LONG TERM GOAL #5   Title  The patient will demonstrate MMT grade of RUE of a least 4/5 to indicate an improved ability to perform functional  activities.    Baseline  unable to MMT due to procotol on  evaluation    Time  8    Period  Weeks    Status  New    Target Date  06/27/18      PT LONG TERM GOAL #6   Title  The patient will demonstrate AROM of R shoulder WFLs per reverse total shoulder to demonstrate improved functional activities.     Baseline  unable per protocol on evaluation.    Time  8    Period  Weeks    Status  New            Plan - 05/26/18 1245    Clinical Impression Statement  Pt. requires extra time to walk around PT clinic and reposition feet with standing ther.ex.  PT added light resistive ex. and instructed in proper technique/ posture with R UE ex.  Slight increase in triceps discomfort noted during shoulder ER/ extension.  No tenderness over R deltoid/ triceps musculature.  See new HEP.      Stability/Clinical Decision Making  Evolving/Moderate complexity    Clinical Decision Making  Moderate    Rehab Potential  Good    Clinical Impairments Affecting Rehab Potential  positive: motivation, family support, negative: co-morbidities, deconditioned, high fall risk; Body structures that will need to be addressed: weakness, numbness/tingling, impaired balance, difficulty walking, decreased transfer ability, etc; Due to co-morbidities her clinical presenstation is evolving;     PT Frequency  2x / week    PT Duration  8 weeks    PT Treatment/Interventions  Cryotherapy;Electrical Stimulation;Ultrasound;Moist Heat;Gait training;Functional mobility training;Therapeutic activities;Therapeutic exercise;Neuromuscular re-education;Scar mobilization    PT Next Visit Plan  UE strengthening and mobility within surgical precaution guidelines     PT Home Exercise Plan  See prescribed HEP.        Patient will benefit from skilled therapeutic intervention in order to improve the following deficits and impairments:  Decreased endurance, Decreased range of motion, Decreased skin integrity, Decreased strength, Hypomobility, Impaired UE functional use, Increased fascial restricitons,  Pain, Postural dysfunction, Impaired flexibility, Decreased scar mobility, Decreased mobility  Visit Diagnosis: S/P reverse total shoulder arthroplasty, right  Stiffness of right shoulder, not elsewhere classified  Chronic right shoulder pain  Muscle weakness of right upper extremity     Problem List Patient Active Problem List   Diagnosis Date Noted  . S/P reverse total shoulder arthroplasty, 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  . Chronic diastolic heart failure ([place]) 08/09/2016  . COPD (chronic obstructive pulmonary disease) ([place]) 08/09/2016  . Pulmonary infiltrates   . Pneumonia due to Streptococcus pneumoniae ([place])   . Fever   . Community acquired pneumonia of right lung   . Palliative care by specialist   . Goals of care, counseling/discussion   . Pressure injury of skin 07/03/2016  . COPD exacerbation ([place]) 07/21/2015  . Acute renal insufficiency 07/21/2015  . Hyperglycemia 07/21/2015  . Benign essential HTN 07/21/2015  . S/P shoulder replacement 07/02/2013   Pura Spice, PT, DPT # [phone] 05/26/2018, 8:01 PM  [place] [facility] [address] [place], [name], [address] Phone: [phone]   Fax:  [phone]  Name: Hannah Powell MRN: [id number] Date of Birth: [birth year]

## 2018-05-28 ENCOUNTER — Other Ambulatory Visit: Payer: Self-pay

## 2018-05-28 ENCOUNTER — Encounter: Payer: Self-pay | Admitting: Physical Therapy

## 2018-05-28 ENCOUNTER — Ambulatory Visit: Payer: Medicare HMO | Admitting: Physical Therapy

## 2018-05-28 DIAGNOSIS — M25611 Stiffness of right shoulder, not elsewhere classified: Secondary | ICD-10-CM

## 2018-05-28 DIAGNOSIS — J9601 Acute respiratory failure with hypoxia: Secondary | ICD-10-CM | POA: Diagnosis not present

## 2018-05-28 DIAGNOSIS — M6281 Muscle weakness (generalized): Secondary | ICD-10-CM

## 2018-05-28 DIAGNOSIS — G8929 Other chronic pain: Secondary | ICD-10-CM

## 2018-05-28 DIAGNOSIS — M25511 Pain in right shoulder: Secondary | ICD-10-CM | POA: Diagnosis not present

## 2018-05-28 DIAGNOSIS — J441 Chronic obstructive pulmonary disease with (acute) exacerbation: Secondary | ICD-10-CM | POA: Diagnosis not present

## 2018-05-28 DIAGNOSIS — J449 Chronic obstructive pulmonary disease, unspecified: Secondary | ICD-10-CM | POA: Diagnosis not present

## 2018-05-28 DIAGNOSIS — Z96611 Presence of right artificial shoulder joint: Secondary | ICD-10-CM | POA: Diagnosis not present

## 2018-05-28 NOTE — Therapy (Signed)
[place] [facility] [address] [place], [name], [address] Phone: [phone]   Fax:  [phone]  Physical Therapy Treatment  Patient Details  Name: Hannah Powell MRN: [id number] Date of Birth: 05/02/1945 No data recorded  Encounter Date: 05/28/2018   Treatment: 8 of 16.  Recert date: 04/21/6331 5456 to 1401   Past Medical History:  Diagnosis Date  . Anxiety   . Arthritis   . CHF (congestive heart failure) ([place])   . COPD (chronic obstructive pulmonary disease) ([place])   . Edema    feet/legs  . Hypertension    dr Otho Najjar     . Hypothyroidism   . Leg weakness, bilateral   . Osteoporosis   . Oxygen deficit    2l with bipap at night  . RLS (restless legs syndrome)   . Shortness of breath   . Sleep apnea    bipap  . Wheezing     Past Surgical History:  Procedure Laterality Date  . BACK SURGERY     cervical  . BREAST BIOPSY Left    needle bx-neg  . CATARACT EXTRACTION W/PHACO Left 05/07/2017   Procedure: CATARACT EXTRACTION PHACO AND INTRAOCULAR LENS PLACEMENT (IOC);  Surgeon: Birder Robson, MD;  Location: [facility];  Service: Ophthalmology;  Laterality: Left;  [place] 00:48.7 AP% 11.3 CDE 5.46 Fluid Pack Lot # [id number]  . CATARACT EXTRACTION W/PHACO Right 04/23/2017   Procedure: CATARACT EXTRACTION PHACO AND INTRAOCULAR LENS PLACEMENT ([place]);  Surgeon: Birder Robson, MD;  Location: [facility];  Service: Ophthalmology;  Laterality: Right;  [place] 00:29 AP% 12.8 CDE 3.79 FLUID PACK LOT # [id number] H  . FOOT ARTHROPLASTY    . JOINT REPLACEMENT     x2 tkr  . TOTAL SHOULDER ARTHROPLASTY Left 07/02/2013   Procedure: LEFT TOTAL SHOULDER ARTHROPLASTY;  Surgeon: Marin Shutter, MD;  Location: [place];  Service: Orthopedics;  Laterality: Left;  . TOTAL SHOULDER ARTHROPLASTY Right 04/10/2018   Procedure: TOTAL REVERSE SHOULDER ARTHROPLASTY;  Surgeon: Justice Britain, MD;  [facility];  Service: Orthopedics;  Laterality: Right;  155min     There were no vitals filed for this visit.    Pt. states she was at gym this morning and worked on [place]. Pt. states she did 30# leg press/ hip abduction/ adduction/ knee extension 3x12 reps. No c/o pain.           FOTO: initial 19/ today 56/ goal 59    Therex:  Standing B shoulder flexion with yellow ball 5x2 (fatigue in B LE).   Seated R shoulder AROM 10x (pain tolerated range). SeatedR shoulder abduction, ER, flexion manualisometrics1x10 with 10 sec holds Seated 2# bicep curls/ 2# shoulder ER/ 2# pron./supination/ wrist flexion/ ext./ wt wand chest press 20x each.   Nautilus: 30# seated lat. Pull downs/ 20# tricep extension 30x each.  Standing R shoulder serratus punches Nustep L5 10 min. B UE/LE. Reviewed HEP   See MDprotocol   PT Short Term Goals - 05/01/18 1612      PT SHORT TERM GOAL #1   Title  Pt. will increase AAROM with ER to 30 degrees in neutral  without increase in pain to regain independent self-care tasks.     Baseline  10degs on evaluation    Time  4    Period  Weeks    Status  New    Target Date  05/28/18        PT Long Term Goals - 05/01/18 1257  PT LONG TERM GOAL #1   Title  Pt. will improve FOTO to 59 to demonstrate improved independence with functional ADL's and decrease pain.     Baseline  Pt. currently scored a 19/59 (04/30/18).    Time  4    Period  Weeks    Status  New    Target Date  05/28/18      PT LONG TERM GOAL #2   Title  --    Baseline  --    Time  --    Period  --    Status  --      PT LONG TERM GOAL #3   Title  Pt. will achieve 110 degrees of AAROM/AROM of shoulder flexion to reach overhead and complete cooking and cleaning task around the house.    Baseline  Pt. currently has 60 degrees of PROM of right shoulder.     Time  4    Period  Weeks    Status  New    Target Date  05/28/18      PT LONG TERM GOAL #4   Title  Pt. will be able to complete HEP and daily activities with less than 4/10 pain  to indicate and improved ability to perform functional activities.    Baseline  Pt. currently experiences 8/10 NPS occasionally after completing HEP.     Time  4    Period  Weeks    Status  New    Target Date  05/28/18      PT LONG TERM GOAL #5   Title  The patient will demonstrate MMT grade of RUE of a least 4/5 to indicate an improved ability to perform functional activities.    Baseline  unable to MMT due to procotol on evaluation    Time  8    Period  Weeks    Status  New    Target Date  06/27/18      PT LONG TERM GOAL #6   Title  The patient will demonstrate AROM of R shoulder WFLs per reverse total shoulder to demonstrate improved functional activities.     Baseline  unable per protocol on evaluation.    Time  8    Period  Weeks    Status  New        Pt. progressing with UE resistive exercise program. PT encouraging pt. in arm swing with gait/ proper use of new HW. No increase c/o shoulder pain after tx. session and pt. becoming more functional with daily/ household tasks.        Patient will benefit from skilled therapeutic intervention in order to improve the following deficits and impairments:  Decreased endurance, Decreased range of motion, Decreased skin integrity, Decreased strength, Hypomobility, Impaired UE functional use, Increased fascial restricitons, Pain, Postural dysfunction, Impaired flexibility, Decreased scar mobility, Decreased mobility  Visit Diagnosis: S/P reverse total shoulder arthroplasty, right  Stiffness of right shoulder, not elsewhere classified  Chronic right shoulder pain  Muscle weakness of right upper extremity     Problem List Patient Active Problem List   Diagnosis Date Noted  . S/P reverse total shoulder arthroplasty, 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  . Chronic diastolic heart failure ([place]) 08/09/2016  . COPD (chronic obstructive pulmonary disease) ([place]) 08/09/2016  . Pulmonary infiltrates   . Pneumonia due to Streptococcus pneumoniae ([place])    . Fever   . Community acquired pneumonia of right lung   . Palliative care by specialist   . Goals of care, counseling/discussion   .  Pressure injury of skin 07/03/2016  . COPD exacerbation ([place]) 07/21/2015  . Acute renal insufficiency 07/21/2015  . Hyperglycemia 07/21/2015  . Benign essential HTN 07/21/2015  . S/P shoulder replacement 07/02/2013   Pura Spice, PT, DPT # [phone] 05/31/2018, 2:01 PM  [place] [facility] [address] [place], [name], [address] Phone: [phone]   Fax:  [phone]  Name: Kateena Degroote MRN: [id number] Date of Birth: 10/07/1945

## 2018-05-29 DIAGNOSIS — I5032 Chronic diastolic (congestive) heart failure: Secondary | ICD-10-CM | POA: Diagnosis not present

## 2018-05-29 DIAGNOSIS — E7800 Pure hypercholesterolemia, unspecified: Secondary | ICD-10-CM | POA: Diagnosis not present

## 2018-05-29 DIAGNOSIS — M48062 Spinal stenosis, lumbar region with neurogenic claudication: Secondary | ICD-10-CM | POA: Diagnosis not present

## 2018-05-29 DIAGNOSIS — G4733 Obstructive sleep apnea (adult) (pediatric): Secondary | ICD-10-CM | POA: Diagnosis not present

## 2018-05-29 DIAGNOSIS — E034 Atrophy of thyroid (acquired): Secondary | ICD-10-CM | POA: Diagnosis not present

## 2018-05-29 DIAGNOSIS — Z0000 Encounter for general adult medical examination without abnormal findings: Secondary | ICD-10-CM | POA: Diagnosis not present

## 2018-05-29 DIAGNOSIS — G63 Polyneuropathy in diseases classified elsewhere: Secondary | ICD-10-CM | POA: Diagnosis not present

## 2018-05-29 DIAGNOSIS — J9611 Chronic respiratory failure with hypoxia: Secondary | ICD-10-CM | POA: Diagnosis not present

## 2018-05-29 DIAGNOSIS — M8588 Other specified disorders of bone density and structure, other site: Secondary | ICD-10-CM | POA: Diagnosis not present

## 2018-05-29 DIAGNOSIS — I10 Essential (primary) hypertension: Secondary | ICD-10-CM | POA: Diagnosis not present

## 2018-05-29 DIAGNOSIS — J438 Other emphysema: Secondary | ICD-10-CM | POA: Diagnosis not present

## 2018-06-03 ENCOUNTER — Other Ambulatory Visit: Payer: Self-pay

## 2018-06-03 ENCOUNTER — Ambulatory Visit: Payer: Medicare HMO | Admitting: Physical Therapy

## 2018-06-03 DIAGNOSIS — G8929 Other chronic pain: Secondary | ICD-10-CM | POA: Diagnosis not present

## 2018-06-03 DIAGNOSIS — I509 Heart failure, unspecified: Secondary | ICD-10-CM | POA: Diagnosis not present

## 2018-06-03 DIAGNOSIS — M25511 Pain in right shoulder: Secondary | ICD-10-CM | POA: Diagnosis not present

## 2018-06-03 DIAGNOSIS — J449 Chronic obstructive pulmonary disease, unspecified: Secondary | ICD-10-CM | POA: Diagnosis not present

## 2018-06-03 DIAGNOSIS — M6281 Muscle weakness (generalized): Secondary | ICD-10-CM | POA: Diagnosis not present

## 2018-06-03 DIAGNOSIS — R0689 Other abnormalities of breathing: Secondary | ICD-10-CM | POA: Diagnosis not present

## 2018-06-03 DIAGNOSIS — Z96611 Presence of right artificial shoulder joint: Secondary | ICD-10-CM

## 2018-06-03 DIAGNOSIS — M25611 Stiffness of right shoulder, not elsewhere classified: Secondary | ICD-10-CM | POA: Diagnosis not present

## 2018-06-03 DIAGNOSIS — R0902 Hypoxemia: Secondary | ICD-10-CM | POA: Diagnosis not present

## 2018-06-04 ENCOUNTER — Encounter: Payer: Medicare HMO | Admitting: Physical Therapy

## 2018-06-05 ENCOUNTER — Other Ambulatory Visit: Payer: Self-pay

## 2018-06-05 ENCOUNTER — Ambulatory Visit: Payer: Medicare HMO | Admitting: Physical Therapy

## 2018-06-05 DIAGNOSIS — Z96611 Presence of right artificial shoulder joint: Secondary | ICD-10-CM | POA: Diagnosis not present

## 2018-06-05 DIAGNOSIS — M25511 Pain in right shoulder: Secondary | ICD-10-CM | POA: Diagnosis not present

## 2018-06-05 DIAGNOSIS — M25611 Stiffness of right shoulder, not elsewhere classified: Secondary | ICD-10-CM | POA: Diagnosis not present

## 2018-06-05 DIAGNOSIS — M6281 Muscle weakness (generalized): Secondary | ICD-10-CM

## 2018-06-05 DIAGNOSIS — G8929 Other chronic pain: Secondary | ICD-10-CM | POA: Diagnosis not present

## 2018-06-06 ENCOUNTER — Encounter: Payer: Self-pay | Admitting: Physical Therapy

## 2018-06-06 NOTE — Therapy (Signed)
[place] [facility] [address] [place], [name], [address] Phone: [phone]   Fax:  [phone]  Physical Therapy Treatment  Patient Details  Name: Hannah Powell MRN: [id number] Date of Birth: 04/24/1945 No data recorded  Encounter Date: 06/03/2018  PT End of Session - 06/06/18 1342    Visit Number  9    Number of Visits  16    Date for PT Re-Evaluation  06/25/18    Authorization - Visit Number  9    Authorization - Number of Visits  10    PT Start Time  0941    PT Stop Time  1037    PT Time Calculation (min)  56 min    Activity Tolerance  Patient tolerated treatment well    Behavior During Therapy  [facility] for tasks assessed/performed       Past Medical History:  Diagnosis Date  . Anxiety   . Arthritis   . CHF (congestive heart failure) ([place])   . COPD (chronic obstructive pulmonary disease) ([place])   . Edema    feet/legs  . Hypertension    dr Otho Najjar     . Hypothyroidism   . Leg weakness, bilateral   . Osteoporosis   . Oxygen deficit    2l with bipap at night  . RLS (restless legs syndrome)   . Shortness of breath   . Sleep apnea    bipap  . Wheezing     Past Surgical History:  Procedure Laterality Date  . BACK SURGERY     cervical  . BREAST BIOPSY Left    needle bx-neg  . CATARACT EXTRACTION W/PHACO Left 05/07/2017   Procedure: CATARACT EXTRACTION PHACO AND INTRAOCULAR LENS PLACEMENT (IOC);  Surgeon: Birder Robson, MD;  Location: [facility];  Service: Ophthalmology;  Laterality: Left;  [place] 00:48.7 AP% 11.3 CDE 5.46 Fluid Pack Lot # [id number]  . CATARACT EXTRACTION W/PHACO Right 04/23/2017   Procedure: CATARACT EXTRACTION PHACO AND INTRAOCULAR LENS PLACEMENT ([place]);  Surgeon: Birder Robson, MD;  Location: [facility];  Service: Ophthalmology;  Laterality: Right;  [place] 00:29 AP% 12.8 CDE 3.79 FLUID PACK LOT # [id number]  . FOOT ARTHROPLASTY    . JOINT REPLACEMENT     x2 tkr  . TOTAL SHOULDER ARTHROPLASTY Left  07/02/2013   Procedure: LEFT TOTAL SHOULDER ARTHROPLASTY;  Surgeon: Marin Shutter, MD;  Location: [place];  Service: Orthopedics;  Laterality: Left;  . TOTAL SHOULDER ARTHROPLASTY Right 04/10/2018   Procedure: TOTAL REVERSE SHOULDER ARTHROPLASTY;  Surgeon: Justice Britain, MD;  [facility];  Service: Orthopedics;  Laterality: Right;  15min    There were no vitals filed for this visit.  Subjective Assessment - 06/06/18 1331    Subjective  Pt. entered PT with use of RW and no increase c/o R shoulder pain with wt. bearing on assistive device.  Pt. ambulates with increase cadence as compared to [facility] or [facility].      Pertinent History  extensive PMH including intensive care unit stays, rehab facility stays, previously ambulated with RW, is now using quad cane due to shoulder precautions. COPD, SOB, HTN. Denies CHF, reported she has been cleared by a cardiologist.    Limitations  Walking;Lifting;Standing;Writing;House hold activities    How long can you sit comfortably?  unlimited    How long can you stand comfortably?  3-4 hours    How long can you walk comfortably?  3-4 hours    Diagnostic tests  last x-rays of left knee  where in November 2016 which looked good;     Patient Stated Goals  Increase R shoulder ROM/ strength    Currently in Pain?  No/denies         Therex:  Nustep L5 10 min. B UE/LE (warm-up/no charge) Seated/ standing R shoulder AROM 10x (pain tolerated range)- mirror feedback. StandingRTB scap. Retraction/ horizontal flexion 20x (CGA for safety with balance) Seated 2# bicep curls/ 2# shoulder ER/ 2# pron./supination/ wrist flexion/ ext./ wt wand chest press 20x each.  Nautilus: 30# seated lat. Pull downs/ 20# tricep extension 30x each.  Standing R shoulder serratus punches  Discussed gym based ex.  See MDprotocol   PT Short Term Goals - 05/01/18 1612      PT SHORT TERM GOAL #1   Title  Pt. will increase AAROM with ER to 30 degrees in neutral  without increase in  pain to regain independent self-care tasks.     Baseline  10degs on evaluation    Time  4    Period  Weeks    Status  New    Target Date  05/28/18        PT Long Term Goals - 05/01/18 1257      PT LONG TERM GOAL #1   Title  Pt. will improve FOTO to 59 to demonstrate improved independence with functional ADL's and decrease pain.     Baseline  Pt. currently scored a 19/59 (04/30/18).    Time  4    Period  Weeks    Status  New    Target Date  05/28/18      PT LONG TERM GOAL #2   Title  --    Baseline  --    Time  --    Period  --    Status  --      PT LONG TERM GOAL #3   Title  Pt. will achieve 110 degrees of AAROM/AROM of shoulder flexion to reach overhead and complete cooking and cleaning task around the house.    Baseline  Pt. currently has 60 degrees of PROM of right shoulder.     Time  4    Period  Weeks    Status  New    Target Date  05/28/18      PT LONG TERM GOAL #4   Title  Pt. will be able to complete HEP and daily activities with less than 4/10 pain to indicate and improved ability to perform functional activities.    Baseline  Pt. currently experiences 8/10 NPS occasionally after completing HEP.     Time  4    Period  Weeks    Status  New    Target Date  05/28/18      PT LONG TERM GOAL #5   Title  The patient will demonstrate MMT grade of RUE of a least 4/5 to indicate an improved ability to perform functional activities.    Baseline  unable to MMT due to procotol on evaluation    Time  8    Period  Weeks    Status  New    Target Date  06/27/18      PT LONG TERM GOAL #6   Title  The patient will demonstrate AROM of R shoulder WFLs per reverse total shoulder to demonstrate improved functional activities.     Baseline  unable per protocol on evaluation.    Time  8    Period  Weeks    Status  New  Plan - 06/06/18 1343    Clinical Impression Statement  Pt. fearful of falling with unsupported standing ther.ex. for UE or functional tasks  in front of shelving unit.  Pt. presents with functional R shoulder ROM during overhead repetitive tasks but limited by weakness/ fatigue.  Pt. continues to benefit from going to gym with husband to focus on overall ther.ex./ cardio.        Stability/Clinical Decision Making  Evolving/Moderate complexity    Clinical Decision Making  Moderate    Rehab Potential  Good    Clinical Impairments Affecting Rehab Potential  positive: motivation, family support, negative: co-morbidities, deconditioned, high fall risk; Body structures that will need to be addressed: weakness, numbness/tingling, impaired balance, difficulty walking, decreased transfer ability, etc; Due to co-morbidities her clinical presenstation is evolving;     PT Frequency  2x / week    PT Duration  8 weeks    PT Treatment/Interventions  Cryotherapy;Electrical Stimulation;Ultrasound;Moist Heat;Gait training;Functional mobility training;Therapeutic activities;Therapeutic exercise;Neuromuscular re-education;Scar mobilization    PT Next Visit Plan  UE strengthening and mobility within surgical precaution guidelines.  CHECK GOALS/ RECERT NEXT TX SESSION     PT Home Exercise Plan  See prescribed HEP.        Patient will benefit from skilled therapeutic intervention in order to improve the following deficits and impairments:  Decreased endurance, Decreased range of motion, Decreased skin integrity, Decreased strength, Hypomobility, Impaired UE functional use, Increased fascial restricitons, Pain, Postural dysfunction, Impaired flexibility, Decreased scar mobility, Decreased mobility  Visit Diagnosis: S/P reverse total shoulder arthroplasty, right  Stiffness of right shoulder, not elsewhere classified  Chronic right shoulder pain  Muscle weakness of right upper extremity     Problem List Patient Active Problem List   Diagnosis Date Noted  . S/P reverse total shoulder arthroplasty, 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  . Chronic diastolic heart failure  ([place]) 08/09/2016  . COPD (chronic obstructive pulmonary disease) ([place]) 08/09/2016  . Pulmonary infiltrates   . Pneumonia due to Streptococcus pneumoniae ([place])   . Fever   . Community acquired pneumonia of right lung   . Palliative care by specialist   . Goals of care, counseling/discussion   . Pressure injury of skin 07/03/2016  . COPD exacerbation ([place]) 07/21/2015  . Acute renal insufficiency 07/21/2015  . Hyperglycemia 07/21/2015  . Benign essential HTN 07/21/2015  . S/P shoulder replacement 07/02/2013   Pura Spice, PT, DPT # [phone] 06/06/2018, 1:55 PM  [place] [facility] [address] [place], [name], [address] Phone: [phone]   Fax:  [phone]  Name: Hannah Powell MRN: [id number] Date of Birth: 1945-11-17

## 2018-06-07 NOTE — Therapy (Signed)
[facility] [address]. [place], [name], [address] Phone: [phone]   Fax:  [phone]  Physical Therapy Treatment Physical Therapy Progress Note   Dates of reporting period  04/30/2018  to   06/05/2018  Patient Details  Name: Hannah Powell MRN: [id number] Date of Birth: 02-28-1946 No data recorded  Encounter Date: 06/05/2018  PT End of Session - 06/07/18 1046    Visit Number  10    Number of Visits  16    Date for PT Re-Evaluation  06/25/18    Authorization - Visit Number  10    Authorization - Number of Visits  10    PT Start Time  [phone]    PT Stop Time  1042    PT Time Calculation (min)  60 min    Activity Tolerance  Patient tolerated treatment well    Behavior During Therapy  [facility] for tasks assessed/performed       Past Medical History:  Diagnosis Date  . Anxiety   . Arthritis   . CHF (congestive heart failure) ([place])   . COPD (chronic obstructive pulmonary disease) ([place])   . Edema    feet/legs  . Hypertension    dr Otho Najjar     . Hypothyroidism   . Leg weakness, bilateral   . Osteoporosis   . Oxygen deficit    2l with bipap at night  . RLS (restless legs syndrome)   . Shortness of breath   . Sleep apnea    bipap  . Wheezing     Past Surgical History:  Procedure Laterality Date  . BACK SURGERY     cervical  . BREAST BIOPSY Left    needle bx-neg  . CATARACT EXTRACTION W/PHACO Left 05/07/2017   Procedure: CATARACT EXTRACTION PHACO AND INTRAOCULAR LENS PLACEMENT (IOC);  Surgeon: Birder Robson, MD;  Location: [facility];  Service: Ophthalmology;  Laterality: Left;  [place] 00:48.7 AP% 11.3 CDE 5.46 Fluid Pack Lot # [id number]  . CATARACT EXTRACTION W/PHACO Right 04/23/2017   Procedure: CATARACT EXTRACTION PHACO AND INTRAOCULAR LENS PLACEMENT ([place]);  Surgeon: Birder Robson, MD;  Location: [facility];  Service: Ophthalmology;  Laterality: Right;  [place] 00:29 AP% 12.8 CDE 3.79 FLUID PACK LOT # [id number]  .  FOOT ARTHROPLASTY    . JOINT REPLACEMENT     x2 tkr  . TOTAL SHOULDER ARTHROPLASTY Left 07/02/2013   Procedure: LEFT TOTAL SHOULDER ARTHROPLASTY;  Surgeon: Marin Shutter, MD;  Location: [place];  Service: Orthopedics;  Laterality: Left;  . TOTAL SHOULDER ARTHROPLASTY Right 04/10/2018   Procedure: TOTAL REVERSE SHOULDER ARTHROPLASTY;  Surgeon: Justice Britain, MD;  Location: [facility];  Service: Orthopedics;  Laterality: Right;  155mn    There were no vitals filed for this visit.  Subjective Assessment - 06/07/18 1029    Subjective  Pt. continues to benefit from use of RW with no new complaints in R sh.  Pt. continues to put increase wt. throught walker with UE.  Pt. instructed to have lighter touch on RW and just use for balance.       Pertinent History  extensive PMH including intensive care unit stays, rehab facility stays, previously ambulated with RW, is now using quad cane due to shoulder precautions. COPD, SOB, HTN. Denies CHF, reported she has been cleared by a cardiologist.    Limitations  Walking;Lifting;Standing;Writing;House hold activities    How long can you sit comfortably?  unlimited    How long can you stand comfortably?  3-4 hours    How long can you walk comfortably?  3-4 hours    Diagnostic tests  last x-rays of left knee where in November 2016 which looked good;     Patient Stated Goals  Increase R shoulder ROM/ strength    Currently in Pain?  No/denies         Therex:   Seated R shoulder AROM 10x (pain tolerated range). SeatedR shoulder abduction, ER, flexion manualisometrics1x10 with 10 sec holds Nustep L5 10 min. B UE/LE. Standing wt. Wand bicep curls/ chest press/ sh. Flexion 10x2 Nautilus: 30# seated lat. Pull downs/ 20# tricep extension/ 20# chest press 30x each.  7# box lifting from waist level and placing on shelf 3x. 2nd shelf (70 inches) cone stacking 10x2  Reviewed HEP  See MDprotocol   PT Short Term Goals - 06/07/18 1054      PT SHORT TERM  GOAL #1   Title  Pt. will increase AAROM with ER to 30 degrees in neutral  without increase in pain to regain independent self-care tasks.     Baseline  30 deg. ER on R.      Time  4    Period  Weeks    Status  Achieved    Target Date  06/05/18        PT Long Term Goals - 06/07/18 1058      PT LONG TERM GOAL #1   Title  Pt. will improve FOTO to 59 to demonstrate improved independence with functional ADL's and decrease pain.     Baseline  FOTO: initial 19/ today 56/ goal 59    Time  4    Period  Weeks    Status  Partially Met    Target Date  06/25/18      PT LONG TERM GOAL #3   Title  Pt. will achieve 110 degrees of AAROM/AROM of shoulder flexion to reach overhead and complete cooking and cleaning task around the house.    Baseline  R shoulder AROM >110 deg. flexion with overhead reaching.      Time  4    Period  Weeks    Status  Achieved    Target Date  06/05/18      PT LONG TERM GOAL #4   Title  Pt. will be able to complete HEP and daily activities with less than 4/10 pain to indicate and improved ability to perform functional activities.    Baseline  No significant c/o R sh. pain (fatigue reported).     Time  4    Period  Weeks    Status  Partially Met    Target Date  06/25/18      PT LONG TERM GOAL #5   Title  The patient will demonstrate MMT grade of RUE of a least 4/5 to indicate an improved ability to perform functional activities.    Baseline  ongoing    Time  8    Period  Weeks    Status  On-going    Target Date  06/25/18      PT LONG TERM GOAL #6   Title  The patient will demonstrate AROM of R shoulder WFLs per reverse total shoulder to demonstrate improved functional activities.     Baseline  goal met    Time  8    Period  Weeks    Status  Achieved            Plan - 06/07/18 1047  Clinical Impression Statement  Pt. continues to work hard with R UE AROM/ stability ex.  Pt. able to complete repetitive overhead reaching at 70 inches with cones and  no increase c/o pain but fatigue noted.  Pt. limited with static/dynamic standing tasks due to balance issues/ fall risk.  Limited with carrying tasks secondary to required use of assistive device.  Pt. will benefit from a couple more weeks of R UE strenghening ex. to maximize functional mobility.      Stability/Clinical Decision Making  Evolving/Moderate complexity    Clinical Decision Making  Moderate    Rehab Potential  Good    Clinical Impairments Affecting Rehab Potential  positive: motivation, family support, negative: co-morbidities, deconditioned, high fall risk; Body structures that will need to be addressed: weakness, numbness/tingling, impaired balance, difficulty walking, decreased transfer ability, etc; Due to co-morbidities her clinical presenstation is evolving;     PT Frequency  2x / week    PT Duration  8 weeks    PT Treatment/Interventions  Cryotherapy;Electrical Stimulation;Ultrasound;Moist Heat;Gait training;Functional mobility training;Therapeutic activities;Therapeutic exercise;Neuromuscular re-education;Scar mobilization    PT Next Visit Plan  UE strengthening and mobility within surgical precaution guidelines.      PT Home Exercise Plan  See prescribed HEP.        Patient will benefit from skilled therapeutic intervention in order to improve the following deficits and impairments:  Decreased endurance, Decreased range of motion, Decreased skin integrity, Decreased strength, Hypomobility, Impaired UE functional use, Increased fascial restricitons, Pain, Postural dysfunction, Impaired flexibility, Decreased scar mobility, Decreased mobility  Visit Diagnosis: S/P reverse total shoulder arthroplasty, right  Stiffness of right shoulder, not elsewhere classified  Chronic right shoulder pain  Muscle weakness of right upper extremity     Problem List Patient Active Problem List   Diagnosis Date Noted  . S/P reverse total shoulder arthroplasty, 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  .  Chronic diastolic heart failure ([place]) 08/09/2016  . COPD (chronic obstructive pulmonary disease) ([place]) 08/09/2016  . Pulmonary infiltrates   . Pneumonia due to Streptococcus pneumoniae ([place])   . Fever   . Community acquired pneumonia of right lung   . Palliative care by specialist   . Goals of care, counseling/discussion   . Pressure injury of skin 07/03/2016  . COPD exacerbation ([place]) 07/21/2015  . Acute renal insufficiency 07/21/2015  . Hyperglycemia 07/21/2015  . Benign essential HTN 07/21/2015  . S/P shoulder replacement 07/02/2013   Pura Spice, PT, DPT # [phone] 06/07/2018, 11:04 AM  [facility] [address] [place], [name], [address] Phone: [phone]   Fax:  [phone]  Name: Hannah Powell MRN: [id number] Date of Birth: August 23, 1945

## 2018-06-09 ENCOUNTER — Encounter: Payer: Medicare HMO | Admitting: Physical Therapy

## 2018-06-11 ENCOUNTER — Encounter: Payer: Medicare HMO | Admitting: Physical Therapy

## 2018-06-16 ENCOUNTER — Encounter: Payer: Medicare HMO | Admitting: Physical Therapy

## 2018-06-17 DIAGNOSIS — J449 Chronic obstructive pulmonary disease, unspecified: Secondary | ICD-10-CM | POA: Diagnosis not present

## 2018-06-17 DIAGNOSIS — M2042 Other hammer toe(s) (acquired), left foot: Secondary | ICD-10-CM | POA: Diagnosis not present

## 2018-06-17 DIAGNOSIS — B351 Tinea unguium: Secondary | ICD-10-CM | POA: Diagnosis not present

## 2018-06-17 DIAGNOSIS — M79675 Pain in left toe(s): Secondary | ICD-10-CM | POA: Diagnosis not present

## 2018-06-17 DIAGNOSIS — M79674 Pain in right toe(s): Secondary | ICD-10-CM | POA: Diagnosis not present

## 2018-06-17 DIAGNOSIS — M898X9 Other specified disorders of bone, unspecified site: Secondary | ICD-10-CM | POA: Diagnosis not present

## 2018-06-23 ENCOUNTER — Encounter: Payer: Medicare HMO | Admitting: Physical Therapy

## 2018-07-04 DIAGNOSIS — I509 Heart failure, unspecified: Secondary | ICD-10-CM | POA: Diagnosis not present

## 2018-07-04 DIAGNOSIS — R0902 Hypoxemia: Secondary | ICD-10-CM | POA: Diagnosis not present

## 2018-07-04 DIAGNOSIS — R0689 Other abnormalities of breathing: Secondary | ICD-10-CM | POA: Diagnosis not present

## 2018-07-04 DIAGNOSIS — J449 Chronic obstructive pulmonary disease, unspecified: Secondary | ICD-10-CM | POA: Diagnosis not present

## 2018-07-10 DIAGNOSIS — J441 Chronic obstructive pulmonary disease with (acute) exacerbation: Secondary | ICD-10-CM | POA: Diagnosis not present

## 2018-07-10 DIAGNOSIS — J449 Chronic obstructive pulmonary disease, unspecified: Secondary | ICD-10-CM | POA: Diagnosis not present

## 2018-07-10 DIAGNOSIS — J9601 Acute respiratory failure with hypoxia: Secondary | ICD-10-CM | POA: Diagnosis not present

## 2018-07-17 DIAGNOSIS — J449 Chronic obstructive pulmonary disease, unspecified: Secondary | ICD-10-CM | POA: Diagnosis not present

## 2018-07-18 DIAGNOSIS — H43812 Vitreous degeneration, left eye: Secondary | ICD-10-CM | POA: Diagnosis not present

## 2018-07-24 DIAGNOSIS — M170 Bilateral primary osteoarthritis of knee: Secondary | ICD-10-CM | POA: Diagnosis not present

## 2018-07-24 DIAGNOSIS — Z96653 Presence of artificial knee joint, bilateral: Secondary | ICD-10-CM | POA: Diagnosis not present

## 2018-07-28 ENCOUNTER — Other Ambulatory Visit: Payer: Self-pay

## 2018-07-28 ENCOUNTER — Ambulatory Visit: Payer: Medicare HMO | Attending: Orthopedic Surgery

## 2018-07-28 DIAGNOSIS — M6281 Muscle weakness (generalized): Secondary | ICD-10-CM | POA: Diagnosis not present

## 2018-07-28 DIAGNOSIS — Z96611 Presence of right artificial shoulder joint: Secondary | ICD-10-CM | POA: Diagnosis not present

## 2018-07-28 DIAGNOSIS — M25611 Stiffness of right shoulder, not elsewhere classified: Secondary | ICD-10-CM

## 2018-07-28 DIAGNOSIS — G8929 Other chronic pain: Secondary | ICD-10-CM | POA: Diagnosis not present

## 2018-07-28 DIAGNOSIS — M25511 Pain in right shoulder: Secondary | ICD-10-CM | POA: Diagnosis not present

## 2018-07-28 NOTE — Therapy (Signed)
[place] [facility] SERVICES [address] [place], [name], [address] Phone: [phone]   Fax:  [phone]  Physical Therapy Treatment/Recertification  Patient Details  Name: Hannah Powell MRN: [id number] Date of Birth: 26-Jun-1945 No data recorded  Encounter Date: 07/28/2018  PT End of Session - 07/28/18 1409    Visit Number  11    Number of Visits  24    Date for PT Re-Evaluation  08/25/18    Authorization - Visit Number  1    Authorization - Number of Visits  10    PT Start Time  7867    PT Stop Time  1455    PT Time Calculation (min)  43 min    Activity Tolerance  Patient tolerated treatment well    Behavior During Therapy  [facility] for tasks assessed/performed       Past Medical History:  Diagnosis Date  . Anxiety   . Arthritis   . CHF (congestive heart failure) ([place])   . COPD (chronic obstructive pulmonary disease) ([place])   . Edema    feet/legs  . Hypertension    dr Otho Najjar     . Hypothyroidism   . Leg weakness, bilateral   . Osteoporosis   . Oxygen deficit    2l with bipap at night  . RLS (restless legs syndrome)   . Shortness of breath   . Sleep apnea    bipap  . Wheezing     Past Surgical History:  Procedure Laterality Date  . BACK SURGERY     cervical  . BREAST BIOPSY Left    needle bx-neg  . CATARACT EXTRACTION W/PHACO Left 05/07/2017   Procedure: CATARACT EXTRACTION PHACO AND INTRAOCULAR LENS PLACEMENT (IOC);  Surgeon: Birder Robson, MD;  Location: [facility];  Service: Ophthalmology;  Laterality: Left;  [place] 00:48.7 AP% 11.3 CDE 5.46 Fluid Pack Lot # [id number]  . CATARACT EXTRACTION W/PHACO Right 04/23/2017   Procedure: CATARACT EXTRACTION PHACO AND INTRAOCULAR LENS PLACEMENT ([place]);  Surgeon: Birder Robson, MD;  Location: [facility];  Service: Ophthalmology;  Laterality: Right;  [place] 00:29 AP% 12.8 CDE 3.79 FLUID PACK LOT # [id number]  . FOOT ARTHROPLASTY    . JOINT REPLACEMENT     x2 tkr  . TOTAL  SHOULDER ARTHROPLASTY Left 07/02/2013   Procedure: LEFT TOTAL SHOULDER ARTHROPLASTY;  Surgeon: Marin Shutter, MD;  Location: [place];  Service: Orthopedics;  Laterality: Left;  . TOTAL SHOULDER ARTHROPLASTY Right 04/10/2018   Procedure: TOTAL REVERSE SHOULDER ARTHROPLASTY;  Surgeon: Justice Britain, MD;  Location: [facility];  Service: Orthopedics;  Laterality: Right;  119mn    There were no vitals filed for this visit.  Subjective Assessment - 07/28/18 1409    Subjective  Pt. reports that she is doing well today. She denies any R shoulder pain upon arrival continues to benefit from use of RW without any issues. She is wondering if she will be able to discharge soon.     Pertinent History  extensive PMH including intensive care unit stays, rehab facility stays, previously ambulated with RW, is now using quad cane due to shoulder precautions. COPD, SOB, HTN. Denies CHF, reported she has been cleared by a cardiologist.    Limitations  Walking;Lifting;Standing;Writing;House hold activities    How long can you sit comfortably?  unlimited    How long can you stand comfortably?  3-4 hours    How long can you walk comfortably?  3-4 hours    Diagnostic tests  last x-rays of left knee where in November 2016 which looked good;     Patient Stated Goals  Increase R shoulder ROM/ strength    Currently in Pain?  No/denies         TREATMENT   Therex UBE x 5 minutes during history (2.5 forward/2.5 backward)  Seated R shoulder AROM flexion and abduction x 10, no pain reported; SeatedRshoulder abduction, adduction, ER, IR, flexion, and extensionisometrics with resistance from therapist 5s hold x 10 each direction; 2nd shelf (70 inches) cone stacking 2 x 8 with therapist adjusting return position for cones; Seated rows with red tband x 10; Nautilus 30# standing lat. pull downs Nautilus 30# tricep extension; AROM: flexion: 118 degrees, abduction: 94 degrees; R shoulder MMT: flexion: >4/5, abduction: 4/5,  ER/IR: >4/5 Updated goals with patient;   Pt educated throughout session about proper posture and technique with exercises. Improved exercise technique, movement at target joints, use of target muscles after min to mod verbal, visual, tactile cues.    Pt has made excellent progress with therapy. She has met almost all of her goals and has returned to most of her functional activity. She denies any pain during therapy or at home. Her AROM abduction is 94 degrees which is somewhat limited however not grossly different than her RUE. She would like to continue strengthening for an additional few weeks until her primary therapist returns and then will likely discharge. Pt will benefit from PT services to address deficits in strength and range of motion in order to return to full function at home.                          PT Short Term Goals - 07/28/18 1417      PT SHORT TERM GOAL #1   Title  Pt. will increase AAROM with ER to 30 degrees in neutral  without increase in pain to regain independent self-care tasks.     Baseline  30 deg. ER on R.      Time  4    Period  Weeks    Status  Achieved    Target Date  06/05/18        PT Long Term Goals - 07/28/18 1417      PT LONG TERM GOAL #1   Title  Pt. will improve FOTO to 59 to demonstrate improved independence with functional ADL's and decrease pain.     Baseline  FOTO: initial 19/ today 56/ goal 59; 07/28/18: Deferred    Time  4    Period  Weeks    Status  Deferred    Target Date  08/25/18      PT LONG TERM GOAL #3   Title  Pt. will achieve 110 degrees of AAROM/AROM of shoulder flexion to reach overhead and complete cooking and cleaning task around the house.    Baseline  R shoulder AROM >110 deg. flexion with overhead reaching.      Time  4    Period  Weeks    Status  Achieved      PT LONG TERM GOAL #4   Title  Pt. will be able to complete HEP and daily activities with less than 4/10 pain to indicate and improved  ability to perform functional activities.    Baseline  No significant c/o R sh. pain (fatigue reported).; 07/28/18: Performing HEP intermittently but has tapered down as she has been performing most all of her functional activities  Time  4    Period  Weeks    Status  Achieved      PT LONG TERM GOAL #5   Title  The patient will demonstrate MMT grade of RUE of a least 4/5 to indicate an improved ability to perform functional activities.    Baseline  ongoing; 07/28/18: R shoulder MMT: flexion: >4/5, abduction: 4/5, ER/IR: >4/5    Time  8    Period  Weeks    Status  Achieved      PT LONG TERM GOAL #6   Title  The patient will demonstrate AROM of R shoulder WFLs per reverse total shoulder to demonstrate improved functional activities.     Baseline  goal met    Time  8    Period  Weeks    Status  Achieved            Plan - 07/28/18 1410    Clinical Impression Statement  Pt has made excellent progress with therapy. She has met almost all of her goals and has returned to most of her functional activity. She denies any pain during therapy or at home. Her AROM abduction is 94 degrees which is somewhat limited however not grossly different than her RUE. She would like to continue strengthening for an additional few weeks until her primary therapist returns and then will likely discharge. Pt will benefit from PT services to address deficits in strength and range of motion in order to return to full function at home.     Stability/Clinical Decision Making  Evolving/Moderate complexity    Rehab Potential  Good    Clinical Impairments Affecting Rehab Potential  positive: motivation, family support, negative: co-morbidities, deconditioned, high fall risk; Body structures that will need to be addressed: weakness, numbness/tingling, impaired balance, difficulty walking, decreased transfer ability, etc; Due to co-morbidities her clinical presenstation is evolving;     PT Frequency  2x / week    PT  Duration  4 weeks    PT Treatment/Interventions  Cryotherapy;Electrical Stimulation;Ultrasound;Moist Heat;Gait training;Functional mobility training;Therapeutic activities;Therapeutic exercise;Neuromuscular re-education;Scar mobilization    PT Next Visit Plan  UE strengthening and mobility within surgical precaution guidelines.      PT Home Exercise Plan  See prescribed HEP.        Patient will benefit from skilled therapeutic intervention in order to improve the following deficits and impairments:  Decreased endurance, Decreased range of motion, Decreased skin integrity, Decreased strength, Hypomobility, Impaired UE functional use, Increased fascial restricitons, Pain, Postural dysfunction, Impaired flexibility, Decreased scar mobility, Decreased mobility  Visit Diagnosis: S/P reverse total shoulder arthroplasty, right  Stiffness of right shoulder, not elsewhere classified     Problem List Patient Active Problem List   Diagnosis Date Noted  . S/P reverse total shoulder arthroplasty, 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  . Chronic diastolic heart failure ([place]) 08/09/2016  . COPD (chronic obstructive pulmonary disease) ([place]) 08/09/2016  . Pulmonary infiltrates   . Pneumonia due to Streptococcus pneumoniae ([place])   . Fever   . Community acquired pneumonia of right lung   . Palliative care by specialist   . Goals of care, counseling/discussion   . Pressure injury of skin 07/03/2016  . COPD exacerbation ([place]) 07/21/2015  . Acute renal insufficiency 07/21/2015  . Hyperglycemia 07/21/2015  . Benign essential HTN 07/21/2015  . S/P shoulder replacement 07/02/2013   Lyndel Safe Ople Girgis PT, DPT, GCS  Hannah Powell 07/29/2018, 12:06 PM  [place] [facility] [place],  [name], [address] Phone: [phone]   Fax:  [phone]  Name: Hannah Powell MRN: [id number] Date of Birth: [birth year]

## 2018-07-30 ENCOUNTER — Ambulatory Visit: Payer: Medicare HMO | Admitting: Physical Therapy

## 2018-08-03 DIAGNOSIS — R0689 Other abnormalities of breathing: Secondary | ICD-10-CM | POA: Diagnosis not present

## 2018-08-03 DIAGNOSIS — I509 Heart failure, unspecified: Secondary | ICD-10-CM | POA: Diagnosis not present

## 2018-08-03 DIAGNOSIS — J449 Chronic obstructive pulmonary disease, unspecified: Secondary | ICD-10-CM | POA: Diagnosis not present

## 2018-08-03 DIAGNOSIS — R0902 Hypoxemia: Secondary | ICD-10-CM | POA: Diagnosis not present

## 2018-08-04 ENCOUNTER — Encounter: Payer: Self-pay | Admitting: Physical Therapy

## 2018-08-04 ENCOUNTER — Other Ambulatory Visit: Payer: Self-pay

## 2018-08-04 ENCOUNTER — Ambulatory Visit: Payer: Medicare HMO

## 2018-08-04 DIAGNOSIS — M25511 Pain in right shoulder: Secondary | ICD-10-CM | POA: Diagnosis not present

## 2018-08-04 DIAGNOSIS — Z96611 Presence of right artificial shoulder joint: Secondary | ICD-10-CM | POA: Diagnosis not present

## 2018-08-04 DIAGNOSIS — G8929 Other chronic pain: Secondary | ICD-10-CM | POA: Diagnosis not present

## 2018-08-04 DIAGNOSIS — M25611 Stiffness of right shoulder, not elsewhere classified: Secondary | ICD-10-CM | POA: Diagnosis not present

## 2018-08-04 DIAGNOSIS — M6281 Muscle weakness (generalized): Secondary | ICD-10-CM | POA: Diagnosis not present

## 2018-08-04 NOTE — Therapy (Signed)
[facility] [address]. [place], [name], [address] Phone: [phone]   Fax:  [phone]  Physical Therapy Treatment  Patient Details  Name: Hannah Powell MRN: [id number] Date of Birth: 08-Sep-1945 No data recorded  Encounter Date: 08/04/2018  PT End of Session - 08/04/18 1053    Visit Number  12    Number of Visits  24    Date for PT Re-Evaluation  08/25/18    Authorization - Visit Number  2    Authorization - Number of Visits  10    PT Start Time  1055    PT Stop Time  1140    PT Time Calculation (min)  45 min    Activity Tolerance  Patient tolerated treatment well    Behavior During Therapy  [facility] for tasks assessed/performed       Past Medical History:  Diagnosis Date  . Anxiety   . Arthritis   . CHF (congestive heart failure) ([place])   . COPD (chronic obstructive pulmonary disease) ([place])   . Edema    feet/legs  . Hypertension    dr Otho Najjar     . Hypothyroidism   . Leg weakness, bilateral   . Osteoporosis   . Oxygen deficit    2l with bipap at night  . RLS (restless legs syndrome)   . Shortness of breath   . Sleep apnea    bipap  . Wheezing     Past Surgical History:  Procedure Laterality Date  . BACK SURGERY     cervical  . BREAST BIOPSY Left    needle bx-neg  . CATARACT EXTRACTION W/PHACO Left 05/07/2017   Procedure: CATARACT EXTRACTION PHACO AND INTRAOCULAR LENS PLACEMENT (IOC);  Surgeon: Birder Robson, MD;  Location: [facility];  Service: Ophthalmology;  Laterality: Left;  [place] 00:48.7 AP% 11.3 CDE 5.46 Fluid Pack Lot # [id number]  . CATARACT EXTRACTION W/PHACO Right 04/23/2017   Procedure: CATARACT EXTRACTION PHACO AND INTRAOCULAR LENS PLACEMENT ([place]);  Surgeon: Birder Robson, MD;  Location: [facility];  Service: Ophthalmology;  Laterality: Right;  [place] 00:29 AP% 12.8 CDE 3.79 FLUID PACK LOT # [id number] H  . FOOT ARTHROPLASTY    . JOINT REPLACEMENT     x2 tkr  . TOTAL SHOULDER ARTHROPLASTY Left  07/02/2013   Procedure: LEFT TOTAL SHOULDER ARTHROPLASTY;  Surgeon: Marin Shutter, MD;  Location: [place];  Service: Orthopedics;  Laterality: Left;  . TOTAL SHOULDER ARTHROPLASTY Right 04/10/2018   Procedure: TOTAL REVERSE SHOULDER ARTHROPLASTY;  Surgeon: Justice Britain, MD;  Location: [facility];  Service: Orthopedics;  Laterality: Right;  151mn    There were no vitals filed for this visit.  Subjective Assessment - 08/04/18 1053    Subjective  Pt. reports that she is doing well today. She woke up with some R shoulder pain this morning but took ibuprofen and used ice and then pain resolved. No shoulder pain upon arrival. No specific questions or concerns currently.    Pertinent History  extensive PMH including intensive care unit stays, rehab facility stays, previously ambulated with RW, is now using quad cane due to shoulder precautions. COPD, SOB, HTN. Denies CHF, reported she has been cleared by a cardiologist.    Limitations  Walking;Lifting;Standing;Writing;House hold activities    How long can you sit comfortably?  unlimited    How long can you stand comfortably?  3-4 hours    How long can you walk comfortably?  3-4 hours    Diagnostic tests  last x-rays of left knee where in November 2016 which looked good;     Patient Stated Goals  Increase R shoulder ROM/ strength    Currently in Pain?  No/denies            TREATMENT   Therex UBE x 5 minutes during history (2.5 forward/2.5 backward)  Seated R shoulder AROM flexion and abduction x 10, no pain reported; SeatedRshoulder abduction, adduction, ER, IR, flexion, and extensionisometrics with resistance from therapist 5s hold x 10 each direction; Seated rows with red tband 2 x 10; Seated bilateral ER with red tband 2 x 10; Seated chest press with cane and gentle manual resistance from therapist 2 x 10; Seated Nautilus 30# seated lat. pull downs 2 x 10; Nautilus 30# tricep extension 2 x 10;   Pt educated throughout session  about proper posture and technique with exercises. Improved exercise technique, movement at target joints, use of target muscles after min to mod verbal, visual, tactile cues.    Pt has made excellent progress with therapy. She has met almost all of her goals and has returned to most of her functional activity. She reports a little bit of R shoulder pain during lat pull downs with bar at chest but modified to decrease range of motion and pain resolves. It is likely she will discharge sometime over the next 1-2 visits once primary therapist returns. Pt will benefit from PT services to address deficits in strength and range of motion in order to return to full function at home.                         PT Short Term Goals - 07/28/18 1417      PT SHORT TERM GOAL #1   Title  Pt. will increase AAROM with ER to 30 degrees in neutral  without increase in pain to regain independent self-care tasks.     Baseline  30 deg. ER on R.      Time  4    Period  Weeks    Status  Achieved    Target Date  06/05/18        PT Long Term Goals - 07/28/18 1417      PT LONG TERM GOAL #1   Title  Pt. will improve FOTO to 59 to demonstrate improved independence with functional ADL's and decrease pain.     Baseline  FOTO: initial 19/ today 56/ goal 59; 07/28/18: Deferred    Time  4    Period  Weeks    Status  Deferred    Target Date  08/25/18      PT LONG TERM GOAL #3   Title  Pt. will achieve 110 degrees of AAROM/AROM of shoulder flexion to reach overhead and complete cooking and cleaning task around the house.    Baseline  R shoulder AROM >110 deg. flexion with overhead reaching.      Time  4    Period  Weeks    Status  Achieved      PT LONG TERM GOAL #4   Title  Pt. will be able to complete HEP and daily activities with less than 4/10 pain to indicate and improved ability to perform functional activities.    Baseline  No significant c/o R sh. pain (fatigue reported).; 07/28/18:  Performing HEP intermittently but has tapered down as she has been performing most all of her functional activities    Time  4  Period  Weeks    Status  Achieved      PT LONG TERM GOAL #5   Title  The patient will demonstrate MMT grade of RUE of a least 4/5 to indicate an improved ability to perform functional activities.    Baseline  ongoing; 07/28/18: R shoulder MMT: flexion: >4/5, abduction: 4/5, ER/IR: >4/5    Time  8    Period  Weeks    Status  Achieved      PT LONG TERM GOAL #6   Title  The patient will demonstrate AROM of R shoulder WFLs per reverse total shoulder to demonstrate improved functional activities.     Baseline  goal met    Time  8    Period  Weeks    Status  Achieved            Plan - 08/04/18 1054    Clinical Impression Statement  Pt has made excellent progress with therapy. She has met almost all of her goals and has returned to most of her functional activity. She reports a little bit of R shoulder pain during lat pull downs with bar at chest but modified to decrease range of motion and pain resolves. It is likely she will discharge sometime over the next 1-2 visits once primary therapist returns. Pt will benefit from PT services to address deficits in strength and range of motion in order to return to full function at home.    Stability/Clinical Decision Making  Evolving/Moderate complexity    Rehab Potential  Good    Clinical Impairments Affecting Rehab Potential  positive: motivation, family support, negative: co-morbidities, deconditioned, high fall risk; Body structures that will need to be addressed: weakness, numbness/tingling, impaired balance, difficulty walking, decreased transfer ability, etc; Due to co-morbidities her clinical presenstation is evolving;     PT Frequency  2x / week    PT Duration  4 weeks    PT Treatment/Interventions  Cryotherapy;Electrical Stimulation;Ultrasound;Moist Heat;Gait training;Functional mobility training;Therapeutic  activities;Therapeutic exercise;Neuromuscular re-education;Scar mobilization    PT Next Visit Plan  UE strengthening and mobility within surgical precaution guidelines.      PT Home Exercise Plan  See prescribed HEP.        Patient will benefit from skilled therapeutic intervention in order to improve the following deficits and impairments:  Decreased endurance, Decreased range of motion, Decreased skin integrity, Decreased strength, Hypomobility, Impaired UE functional use, Increased fascial restricitons, Pain, Postural dysfunction, Impaired flexibility, Decreased scar mobility, Decreased mobility  Visit Diagnosis: S/P reverse total shoulder arthroplasty, right  Stiffness of right shoulder, not elsewhere classified     Problem List Patient Active Problem List   Diagnosis Date Noted  . S/P reverse total shoulder arthroplasty, 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  . Chronic diastolic heart failure ([place]) 08/09/2016  . COPD (chronic obstructive pulmonary disease) ([place]) 08/09/2016  . Pulmonary infiltrates   . Pneumonia due to Streptococcus pneumoniae ([place])   . Fever   . Community acquired pneumonia of right lung   . Palliative care by specialist   . Goals of care, counseling/discussion   . Pressure injury of skin 07/03/2016  . COPD exacerbation ([place]) 07/21/2015  . Acute renal insufficiency 07/21/2015  . Hyperglycemia 07/21/2015  . Benign essential HTN 07/21/2015  . S/P shoulder replacement 07/02/2013   Phillips Grout PT, DPT, GCS  Huprich,Jason 08/04/2018, 1:11 PM  [place] [facility] [address] [place], [name], [address] Phone: [phone]   Fax:  [phone]  Name: Judson Roch  Conya Ellinwood MRN: [id number] Date of Birth: [birth year]

## 2018-08-06 ENCOUNTER — Encounter: Payer: Medicare HMO | Admitting: Physical Therapy

## 2018-08-12 ENCOUNTER — Other Ambulatory Visit: Payer: Self-pay

## 2018-08-12 ENCOUNTER — Ambulatory Visit: Payer: Medicare HMO | Admitting: Physical Therapy

## 2018-08-12 ENCOUNTER — Encounter: Payer: Self-pay | Admitting: Physical Therapy

## 2018-08-12 DIAGNOSIS — M6281 Muscle weakness (generalized): Secondary | ICD-10-CM | POA: Diagnosis not present

## 2018-08-12 DIAGNOSIS — G8929 Other chronic pain: Secondary | ICD-10-CM

## 2018-08-12 DIAGNOSIS — M25611 Stiffness of right shoulder, not elsewhere classified: Secondary | ICD-10-CM

## 2018-08-12 DIAGNOSIS — M25511 Pain in right shoulder: Secondary | ICD-10-CM | POA: Diagnosis not present

## 2018-08-12 DIAGNOSIS — Z96611 Presence of right artificial shoulder joint: Secondary | ICD-10-CM | POA: Diagnosis not present

## 2018-08-12 NOTE — Therapy (Signed)
[facility] [address]. [place], [name], [address] Phone: [phone]   Fax:  [phone]  Physical Therapy Treatment  Patient Details  Name: Hannah Powell MRN: [id number] Date of Birth: September 30, 1945 No data recorded  Encounter Date: 08/12/2018  PT End of Session - 08/12/18 1232    Visit Number  13    Number of Visits  24    Date for PT Re-Evaluation  08/25/18    PT Start Time  1026    PT Stop Time  1116    PT Time Calculation (min)  50 min    Activity Tolerance  Patient tolerated treatment well    Behavior During Therapy  [facility] for tasks assessed/performed       Past Medical History:  Diagnosis Date  . Anxiety   . Arthritis   . CHF (congestive heart failure) ([place])   . COPD (chronic obstructive pulmonary disease) ([place])   . Edema    feet/legs  . Hypertension    dr Otho Najjar     . Hypothyroidism   . Leg weakness, bilateral   . Osteoporosis   . Oxygen deficit    2l with bipap at night  . RLS (restless legs syndrome)   . Shortness of breath   . Sleep apnea    bipap  . Wheezing     Past Surgical History:  Procedure Laterality Date  . BACK SURGERY     cervical  . BREAST BIOPSY Left    needle bx-neg  . CATARACT EXTRACTION W/PHACO Left 05/07/2017   Procedure: CATARACT EXTRACTION PHACO AND INTRAOCULAR LENS PLACEMENT (IOC);  Surgeon: Birder Robson, MD;  Location: [facility];  Service: Ophthalmology;  Laterality: Left;  [place] 00:48.7 AP% 11.3 CDE 5.46 Fluid Pack Lot # [id number]  . CATARACT EXTRACTION W/PHACO Right 04/23/2017   Procedure: CATARACT EXTRACTION PHACO AND INTRAOCULAR LENS PLACEMENT ([place]);  Surgeon: Birder Robson, MD;  Location: [facility];  Service: Ophthalmology;  Laterality: Right;  [place] 00:29 AP% 12.8 CDE 3.79 FLUID PACK LOT # [id number] H  . FOOT ARTHROPLASTY    . JOINT REPLACEMENT     x2 tkr  . TOTAL SHOULDER ARTHROPLASTY Left 07/02/2013   Procedure: LEFT TOTAL SHOULDER ARTHROPLASTY;  Surgeon: Marin Shutter, MD;  Location: [place];  Service: Orthopedics;  Laterality: Left;  . TOTAL SHOULDER ARTHROPLASTY Right 04/10/2018   Procedure: TOTAL REVERSE SHOULDER ARTHROPLASTY;  Surgeon: Justice Britain, MD;  Location: [facility];  Service: Orthopedics;  Laterality: Right;  17mn    There were no vitals filed for this visit.  Subjective Assessment - 08/12/18 1229    Subjective  Pt. states she is doing well.  No new complaints.  Pt. reports no issues with current shoulder exercise program.      Pertinent History  extensive PMH including intensive care unit stays, rehab facility stays, previously ambulated with RW, is now using quad cane due to shoulder precautions. COPD, SOB, HTN. Denies CHF, reported she has been cleared by a cardiologist.    Limitations  Walking;Lifting;Standing;Writing;House hold activities    How long can you sit comfortably?  unlimited    How long can you stand comfortably?  3-4 hours    How long can you walk comfortably?  3-4 hours    Diagnostic tests  last x-rays of left knee where in November 2016 which looked good;     Patient Stated Goals  Increase R shoulder ROM/ strength    Currently in Pain?  No/denies  TREATMENT   Therex UBE x 6 minutes. Seated R shoulder AROMall planes of movement. Reviewed HEP in depth Seated Nautilus 30# seatedlat. pull downs 2 x 10, standing tricep extension, standing scap. Retraction, tricep extension 20x.  ;    Pt educated throughout session about proper posture and technique with exercises. Improved exercise technique, movement at target joints, use of target muscles after min to mod verbal, visual, tactile cues.   Discharge visit    PT Short Term Goals - 08/12/18 1236      PT SHORT TERM GOAL #1   Title  Pt. will increase AAROM with ER to 30 degrees in neutral  without increase in pain to regain independent self-care tasks.     Baseline  30 deg. ER on R.      Time  4    Period  Weeks    Status  Achieved    Target  Date  06/05/18        PT Long Term Goals - 08/12/18 1236      PT LONG TERM GOAL #1   Title  Pt. will improve FOTO to 59 to demonstrate improved independence with functional ADL's and decrease pain.     Baseline  FOTO: initial 19/ today 56/ goal 59; 07/28/18: Deferred.  5/26: 65    Time  4    Period  Weeks    Status  Achieved    Target Date  08/12/18      PT LONG TERM GOAL #3   Title  Pt. will achieve 110 degrees of AAROM/AROM of shoulder flexion to reach overhead and complete cooking and cleaning task around the house.    Baseline  R shoulder AROM >110 deg. flexion with overhead reaching.      Time  4    Period  Weeks    Status  Achieved      PT LONG TERM GOAL #4   Title  Pt. will be able to complete HEP and daily activities with less than 4/10 pain to indicate and improved ability to perform functional activities.    Baseline  No significant c/o R sh. pain (fatigue reported).; 07/28/18: Performing HEP intermittently but has tapered down as she has been performing most all of her functional activities    Time  4    Period  Weeks    Status  Achieved      PT LONG TERM GOAL #5   Title  The patient will demonstrate MMT grade of RUE of a least 4/5 to indicate an improved ability to perform functional activities.    Baseline  ongoing; 07/28/18: R shoulder MMT: flexion: >4/5, abduction: 4/5, ER/IR: >4/5    Time  8    Period  Weeks    Status  Achieved      PT LONG TERM GOAL #6   Title  The patient will demonstrate AROM of R shoulder WFLs per reverse total shoulder to demonstrate improved functional activities.     Baseline  goal met    Time  8    Period  Weeks    Status  Achieved            Plan - 08/12/18 1232    Clinical Impression Statement  Pt. has progressed well and met all PT goals.  Pt. understands current UE strengthening ex. program.  FOTO:  goal 59/ today 65.  Grip strength: L 49#/ R 57#.  Discharge from PT services at this time.  Pt. instructed to contact PT if  any  issues or questions.      Stability/Clinical Decision Making  Evolving/Moderate complexity    Clinical Decision Making  Moderate    Rehab Potential  Good    Clinical Impairments Affecting Rehab Potential  positive: motivation, family support, negative: co-morbidities, deconditioned, high fall risk; Body structures that will need to be addressed: weakness, numbness/tingling, impaired balance, difficulty walking, decreased transfer ability, etc; Due to co-morbidities her clinical presenstation is evolving;     PT Frequency  2x / week    PT Duration  4 weeks    PT Treatment/Interventions  Cryotherapy;Electrical Stimulation;Ultrasound;Moist Heat;Gait training;Functional mobility training;Therapeutic activities;Therapeutic exercise;Neuromuscular re-education;Scar mobilization    PT Next Visit Plan  Discharge    PT Home Exercise Plan  See prescribed HEP.     Consulted and Agree with Plan of Care  Patient;Family member/caregiver       Patient will benefit from skilled therapeutic intervention in order to improve the following deficits and impairments:  Decreased endurance, Decreased range of motion, Decreased skin integrity, Decreased strength, Hypomobility, Impaired UE functional use, Increased fascial restricitons, Pain, Postural dysfunction, Impaired flexibility, Decreased scar mobility, Decreased mobility  Visit Diagnosis: S/P reverse total shoulder arthroplasty, right  Stiffness of right shoulder, not elsewhere classified  Chronic right shoulder pain  Muscle weakness of right upper extremity     Problem List Patient Active Problem List   Diagnosis Date Noted  . S/P reverse total shoulder arthroplasty, 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  . Chronic diastolic heart failure ([place]) 08/09/2016  . COPD (chronic obstructive pulmonary disease) ([place]) 08/09/2016  . Pulmonary infiltrates   . Pneumonia due to Streptococcus pneumoniae ([place])   . Fever   . Community acquired pneumonia of right lung   . Palliative  care by specialist   . Goals of care, counseling/discussion   . Pressure injury of skin 07/03/2016  . COPD exacerbation ([place]) 07/21/2015  . Acute renal insufficiency 07/21/2015  . Hyperglycemia 07/21/2015  . Benign essential HTN 07/21/2015  . S/P shoulder replacement 07/02/2013   Pura Spice, PT, DPT # [id number] 08/12/2018, 12:38 PM  [facility] [address] [place], [name], [address] Phone: [phone]   Fax:  [phone]  Name: Hannah Powell MRN: [id number] Date of Birth: 07/05/1945

## 2018-08-13 DIAGNOSIS — J438 Other emphysema: Secondary | ICD-10-CM | POA: Diagnosis not present

## 2018-08-13 DIAGNOSIS — J9611 Chronic respiratory failure with hypoxia: Secondary | ICD-10-CM | POA: Diagnosis not present

## 2018-08-13 DIAGNOSIS — Z79899 Other long term (current) drug therapy: Secondary | ICD-10-CM | POA: Diagnosis not present

## 2018-08-13 DIAGNOSIS — G63 Polyneuropathy in diseases classified elsewhere: Secondary | ICD-10-CM | POA: Diagnosis not present

## 2018-08-13 DIAGNOSIS — I10 Essential (primary) hypertension: Secondary | ICD-10-CM | POA: Diagnosis not present

## 2018-08-13 DIAGNOSIS — M48062 Spinal stenosis, lumbar region with neurogenic claudication: Secondary | ICD-10-CM | POA: Diagnosis not present

## 2018-08-13 DIAGNOSIS — I5032 Chronic diastolic (congestive) heart failure: Secondary | ICD-10-CM | POA: Diagnosis not present

## 2018-08-13 DIAGNOSIS — G4733 Obstructive sleep apnea (adult) (pediatric): Secondary | ICD-10-CM | POA: Diagnosis not present

## 2018-08-14 ENCOUNTER — Encounter: Payer: Medicare HMO | Admitting: Physical Therapy

## 2018-08-17 DIAGNOSIS — J449 Chronic obstructive pulmonary disease, unspecified: Secondary | ICD-10-CM | POA: Diagnosis not present

## 2018-09-03 ENCOUNTER — Ambulatory Visit: Payer: Medicare HMO | Attending: Orthopedic Surgery | Admitting: Physical Therapy

## 2018-09-03 ENCOUNTER — Encounter: Payer: Self-pay | Admitting: Physical Therapy

## 2018-09-03 ENCOUNTER — Other Ambulatory Visit: Payer: Self-pay

## 2018-09-03 DIAGNOSIS — I509 Heart failure, unspecified: Secondary | ICD-10-CM | POA: Diagnosis not present

## 2018-09-03 DIAGNOSIS — R269 Unspecified abnormalities of gait and mobility: Secondary | ICD-10-CM | POA: Diagnosis not present

## 2018-09-03 DIAGNOSIS — R0902 Hypoxemia: Secondary | ICD-10-CM | POA: Diagnosis not present

## 2018-09-03 DIAGNOSIS — J449 Chronic obstructive pulmonary disease, unspecified: Secondary | ICD-10-CM | POA: Diagnosis not present

## 2018-09-03 DIAGNOSIS — R293 Abnormal posture: Secondary | ICD-10-CM | POA: Diagnosis not present

## 2018-09-03 DIAGNOSIS — M6281 Muscle weakness (generalized): Secondary | ICD-10-CM | POA: Diagnosis not present

## 2018-09-03 DIAGNOSIS — R0689 Other abnormalities of breathing: Secondary | ICD-10-CM | POA: Diagnosis not present

## 2018-09-03 NOTE — Therapy (Addendum)
[place] [facility] [address] [place], [name], [address] Phone: [phone]   Fax:  [phone]  Physical Therapy Evaluation  Patient Details  Name: Hannah Powell MRN: [id number] Date of Birth: 1945-07-26 Referring Provider (PT): Dr. Ramonita Lab III   Encounter Date: 09/03/2018  PT End of Session - 09/08/18 0727    Visit Number  1    Number of Visits  8    Date for PT Re-Evaluation  10/01/18    Authorization - Visit Number  1    Authorization - Number of Visits  10    PT Start Time  1302    PT Stop Time  1359    PT Time Calculation (min)  57 min    Activity Tolerance  Patient tolerated treatment well;Patient limited by fatigue    Behavior During Therapy  [facility] for tasks assessed/performed       Past Medical History:  Diagnosis Date  . Anxiety   . Arthritis   . CHF (congestive heart failure) ([place])   . COPD (chronic obstructive pulmonary disease) ([place])   . Edema    feet/legs  . Hypertension    dr Otho Najjar     . Hypothyroidism   . Leg weakness, bilateral   . Osteoporosis   . Oxygen deficit    2l with bipap at night  . RLS (restless legs syndrome)   . Shortness of breath   . Sleep apnea    bipap  . Wheezing     Past Surgical History:  Procedure Laterality Date  . BACK SURGERY     cervical  . BREAST BIOPSY Left    needle bx-neg  . CATARACT EXTRACTION W/PHACO Left 05/07/2017   Procedure: CATARACT EXTRACTION PHACO AND INTRAOCULAR LENS PLACEMENT (IOC);  Surgeon: Birder Robson, MD;  Location: [facility];  Service: Ophthalmology;  Laterality: Left;  [place] 00:48.7 AP% 11.3 CDE 5.46 Fluid Pack Lot # [id number]  . CATARACT EXTRACTION W/PHACO Right 04/23/2017   Procedure: CATARACT EXTRACTION PHACO AND INTRAOCULAR LENS PLACEMENT ([place]);  Surgeon: Birder Robson, MD;  Location: [facility];  Service: Ophthalmology;  Laterality: Right;  [place] 00:29 AP% 12.8 CDE 3.79 FLUID PACK LOT # [id number]  . FOOT ARTHROPLASTY    . JOINT  REPLACEMENT     x2 tkr  . TOTAL SHOULDER ARTHROPLASTY Left 07/02/2013   Procedure: LEFT TOTAL SHOULDER ARTHROPLASTY;  Surgeon: Marin Shutter, MD;  Location: [place];  Service: Orthopedics;  Laterality: Left;  . TOTAL SHOULDER ARTHROPLASTY Right 04/10/2018   Procedure: TOTAL REVERSE SHOULDER ARTHROPLASTY;  Surgeon: Justice Britain, MD;  [facility];  Service: Orthopedics;  Laterality: Right;  127min    There were no vitals filed for this visit.    Pt. referred to PT to focus to increase B LE muscle strength/ safety with walking. Pt. known well to PT after recent discharge due to TSR.     B LE AROM: limited L ankle AROM as compared to R ankle.  L LE muscle fatigue noted with SLR.   B LE muscle weakness: hip flexor 4/5 MMT, quads/ hamstring 4+/5 MMT, hip abduction 4+/5 MMT, hip adduction 4/5 MMT, ankle 4/5 MMT except PF 3+/5 MMT.   Sit to stand requires use of at least 1 UE assist on armrest. Ambulates with use of RW in PT clinics.  Pt. Will use Loftstrands while walking on sand/ beach.    Supine SLR/ heel slides/ marching/ bolster bridging 10x each.  Nustep L5 10 min. B  UE/LE.  Standing hip ex.: marching/ hip abduction (lateral walking)/ heel raises 20x each.         Posture/Postural Control    Posture Comments slumped posture with rounded upper back, protracted shoulder and forward head       AROM   Overall AROM Comments BUE and BLE AROM is [facility]       Strength   Overall Strength Comments BUE gross strength is WNL   Right/Left Hip Right;Left   Right Hip Flexion 4/5   Right Hip ABduction 4+/5   Right Hip ADduction 4/5   Left Hip Flexion 4/5   Left Hip ABduction 4+/5   Left Hip ADduction 4/5   Right Knee Flexion 4+/5   Right Knee Extension 4+/5   Left Knee Flexion 4+/5   Left Knee Extension 4+/5   Right Ankle Dorsiflexion 3+/5   Left Ankle Dorsiflexion 3+/5       Palpation   Palpation comment Deferred, not related to current episode of care        Transfers   Comments Requires UE support for all transfers. Unable to perform sit to stand without heavy bilateral UE assist       Ambulation/Gait   Gait Comments ambulates with RW with distant supervision, slower gait speed               PT Education - 09/08/18 0726    Education provided  Yes    Education Details  Standing 4-way hip ex. (no resistance)- cuing for upright posture.  Reviewed supine ther.ex.    Person(s) Educated  Patient    Methods  Explanation;Demonstration;Handout;Verbal cues    Comprehension  Verbalized understanding;Returned demonstration         PT Long Term Goals - 09/08/18 0737      PT LONG TERM GOAL #1   Title  Pt. will increase FOTO to 56 to improve functional mobility.    Baseline  Initial FOTO: 51    Time  4    Period  Weeks    Status  New    Target Date  10/01/18      PT LONG TERM GOAL #2   Title  Pt. will be independent with HEP to increase B LE muscle strength 1/2 muscle grade to improve standing tolerance/ gait.    Baseline  B LE AROM: limited L ankle AROM as compared to R ankle. B LE muscle weakness: hip flexor 4/5 MMT, quads/ hamstring 4+/5 MMT, hip abduction 4+/5 MMT, hip adduction 4/5 MMT, ankle 4/5 MMT except PF 3+/5 MMT.    Time  4    Period  Weeks    Status  New    Target Date  10/01/18      PT LONG TERM GOAL #3   Title  Pt. will demonstrate improved upright posture for >15 minutes with standing ther.ex.    Baseline  Poor standing posture/ forward head/ rounded sh.    Time  4    Period  Weeks    Status  New    Target Date  10/01/18      PT LONG TERM GOAL #4   Title  Pt. able to ambulate outside with consistent hip flexion/ heel strike and least assistive device safely.    Baseline  Pt. ambulates with RW with limited hip flexion/ increase fall risk.    Time  4    Period  Weeks    Status  New    Target Date  10/01/18  Pt is a pleasant 73 yo female referred for bilateral LE weakness. Pt. known well to PT after  recent discharge for TSR. Pt presents today with bilateral LE weakness and poor posture/ endurance. B LE AROM: limited L ankle AROM as compared to R ankle. B LE muscle weakness: hip flexor 4/5 MMT, quads/ hamstring 4+/5 MMT, hip abduction 4+/5 MMT, hip adduction 4/5 MMT, ankle 4/5 MMT except PF 3+/5 MMT. Pt. ambulates with use of RW inside/outside and will occasionally use Loftstrand crutches when walking on sand/ uneven terrain. No recent falls but increase risk noted. Poor seated/ standing posture with increase forward head/ thoracic kyphosis. Posture worsens with increase distance walked/ prolonged standing. FOTO: initial 51/ goal 56. Pt. will benefit from skilled PT services to address deficits in strength, balance, and endurance in order to improve overall functional mobility.       Patient will benefit from skilled therapeutic intervention in order to improve the following deficits and impairments:  Decreased endurance, Decreased range of motion, Decreased skin integrity, Decreased strength, Hypomobility, Impaired UE functional use, Increased fascial restricitons, Pain, Postural dysfunction, Impaired flexibility, Decreased scar mobility, Decreased mobility, Decreased balance, Improper body mechanics  Visit Diagnosis: 1. Muscle weakness (generalized)   2. Gait difficulty   3. Abnormal posture        Problem List Patient Active Problem List   Diagnosis Date Noted  . S/P reverse total shoulder arthroplasty, 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  . Chronic diastolic heart failure ([place]) 08/09/2016  . COPD (chronic obstructive pulmonary disease) ([place]) 08/09/2016  . Pulmonary infiltrates   . Pneumonia due to Streptococcus pneumoniae ([place])   . Fever   . Community acquired pneumonia of right lung   . Palliative care by specialist   . Goals of care, counseling/discussion   . Pressure injury of skin 07/03/2016  . COPD exacerbation ([place]) 07/21/2015  . Acute renal insufficiency 07/21/2015  . Hyperglycemia  07/21/2015  . Benign essential HTN 07/21/2015  . S/P shoulder replacement 07/02/2013   Pura Spice, PT, DPT # [phone] 09/08/2018, 7:46 AM  [place] [address] [place], [name], [address] Phone: [phone]   Fax:  [phone]  Name: Hannah Powell MRN: [id number] Date of Birth: 12-03-1945

## 2018-09-08 ENCOUNTER — Ambulatory Visit: Payer: Medicare HMO | Admitting: Physical Therapy

## 2018-09-08 ENCOUNTER — Other Ambulatory Visit: Payer: Self-pay

## 2018-09-08 DIAGNOSIS — R269 Unspecified abnormalities of gait and mobility: Secondary | ICD-10-CM

## 2018-09-08 DIAGNOSIS — M6281 Muscle weakness (generalized): Secondary | ICD-10-CM | POA: Diagnosis not present

## 2018-09-08 DIAGNOSIS — R293 Abnormal posture: Secondary | ICD-10-CM

## 2018-09-08 NOTE — Addendum Note (Signed)
Addended by: Pura Spice on: 09/08/2018 07:49 AM   Modules accepted: Orders

## 2018-09-09 ENCOUNTER — Encounter: Payer: Self-pay | Admitting: Physical Therapy

## 2018-09-09 NOTE — Therapy (Signed)
[place] [facility] [address] [place], [name], [address] Phone: [phone]   Fax:  [phone]  Physical Therapy Treatment  Patient Details  Name: Hannah Powell MRN: [id number] Date of Birth: 1945-11-30 Referring Provider (PT): Dr. Ramonita Lab III   Encounter Date: 09/08/2018  PT End of Session - 09/09/18 1637    Visit Number  2    Number of Visits  8    Date for PT Re-Evaluation  10/01/18    Authorization - Visit Number  2    Authorization - Number of Visits  10    PT Start Time  1202    PT Stop Time  1301    PT Time Calculation (min)  59 min    Equipment Utilized During Treatment  Gait belt    Activity Tolerance  Patient tolerated treatment well;Patient limited by fatigue    Behavior During Therapy  [facility] for tasks assessed/performed       Past Medical History:  Diagnosis Date  . Anxiety   . Arthritis   . CHF (congestive heart failure) ([place])   . COPD (chronic obstructive pulmonary disease) ([place])   . Edema    feet/legs  . Hypertension    dr Otho Najjar     . Hypothyroidism   . Leg weakness, bilateral   . Osteoporosis   . Oxygen deficit    2l with bipap at night  . RLS (restless legs syndrome)   . Shortness of breath   . Sleep apnea    bipap  . Wheezing     Past Surgical History:  Procedure Laterality Date  . BACK SURGERY     cervical  . BREAST BIOPSY Left    needle bx-neg  . CATARACT EXTRACTION W/PHACO Left 05/07/2017   Procedure: CATARACT EXTRACTION PHACO AND INTRAOCULAR LENS PLACEMENT (IOC);  Surgeon: Birder Robson, MD;  Location: [facility];  Service: Ophthalmology;  Laterality: Left;  [place] 00:48.7 AP% 11.3 CDE 5.46 Fluid Pack Lot # [id number]  . CATARACT EXTRACTION W/PHACO Right 04/23/2017   Procedure: CATARACT EXTRACTION PHACO AND INTRAOCULAR LENS PLACEMENT ([place]);  Surgeon: Birder Robson, MD;  Location: [facility];  Service: Ophthalmology;  Laterality: Right;  [place] 00:29 AP% 12.8 CDE 3.79 FLUID PACK LOT #  [id number]  . FOOT ARTHROPLASTY    . JOINT REPLACEMENT     x2 tkr  . TOTAL SHOULDER ARTHROPLASTY Left 07/02/2013   Procedure: LEFT TOTAL SHOULDER ARTHROPLASTY;  Surgeon: Marin Shutter, MD;  Location: [place];  Service: Orthopedics;  Laterality: Left;  . TOTAL SHOULDER ARTHROPLASTY Right 04/10/2018   Procedure: TOTAL REVERSE SHOULDER ARTHROPLASTY;  Surgeon: Justice Britain, MD;  [facility];  Service: Orthopedics;  Laterality: Right;  143min    There were no vitals filed for this visit.  Subjective Assessment - 09/09/18 1635    Subjective  Pt. reports no pain in LE but balance issues.  Pt. states she feels L knee almost buckled this morning.  Pt. states she is not trusting her LE, esp. with prolonged standing/walking tasks.    Patient is accompained by:  Family member    Pertinent History  extensive PMH including intensive care unit stays, rehab facility stays, previously ambulated with RW.. COPD, SOB, HTN. Denies CHF, reported she has been cleared by a cardiologist.    Limitations  Walking;Lifting;Standing;Writing;House hold activities    How long can you sit comfortably?  unlimited    Diagnostic tests  last x-rays of left knee where in November 2016 which  looked good;     Patient Stated Goals  Increase B LE muscle strength/ safety with gait    Currently in Pain?  No/denies         There.ex.:   Nustep L5 10 min. B UE/LE (warm-up). Standing hip ex.: high marching/ hip abduction/ heel raises/ knee flexion 20x each. Standing wt. Shifting/ step taps in //-bars (UE assist required) Walking cone taps in //-bars (mirror feedback and CGA/min. A for safety) Seated alt. UE/LE progressing to walking alt. UE/LE in //-bars Stand to sit with no UE assist (eccentric control)- 10x Reviewed HEP (will issue new handouts next tx. Session).     PT Short Term Goals - 08/12/18 1236      PT SHORT TERM GOAL #1   Title  Pt. will increase AAROM with ER to 30 degrees in neutral  without increase in pain to  regain independent self-care tasks.     Baseline  30 deg. ER on R.      Time  4    Period  Weeks    Status  Achieved    Target Date  06/05/18        PT Long Term Goals - 09/08/18 0737      PT LONG TERM GOAL #1   Title  Pt. will increase FOTO to 56 to improve functional mobility.    Baseline  Initial FOTO: 51    Time  4    Period  Weeks    Status  New    Target Date  10/01/18      PT LONG TERM GOAL #2   Title  Pt. will be independent with HEP to increase B LE muscle strength 1/2 muscle grade to improve standing tolerance/ gait.    Baseline  B LE AROM: limited L ankle AROM as compared to R ankle. B LE muscle weakness: hip flexor 4/5 MMT, quads/ hamstring 4+/5 MMT, hip abduction 4+/5 MMT, hip adduction 4/5 MMT, ankle 4/5 MMT except PF 3+/5 MMT.    Time  4    Period  Weeks    Status  New    Target Date  10/01/18      PT LONG TERM GOAL #3   Title  Pt. will demonstrate improved upright posture for >15 minutes with standing ther.ex.    Baseline  Poor standing posture/ forward head/ rounded sh.    Time  4    Period  Weeks    Status  New    Target Date  10/01/18      PT LONG TERM GOAL #4   Title  Pt. able to ambulate outside with consistent hip flexion/ heel strike and least assistive device safely.    Baseline  Pt. ambulates with RW with limited hip flexion/ increase fall risk.    Time  4    Period  Weeks    Status  New    Target Date  10/01/18            Plan - 09/09/18 1638    Clinical Impression Statement  Pt. requires max. cuing for posture correction.  CGA/min. A with cone taps in //-bars due to LE muscle weakness/ fear of falling.  Pt. able to stand from gray chair with min. to no UE assist safely.  UE assist required with all standing ther.ex. (high marching/ abduction) due to fear of L knee bucking/ limited stance time on L.    Stability/Clinical Decision Making  Evolving/Moderate complexity    Rehab Potential  Good  Clinical Impairments Affecting Rehab  Potential  positive: motivation, family support, negative: co-morbidities, deconditioned, high fall risk; Body structures that will need to be addressed: weakness, numbness/tingling, impaired balance, difficulty walking, decreased transfer ability, etc; Due to co-morbidities her clinical presenstation is evolving;     PT Frequency  2x / week    PT Duration  4 weeks    PT Treatment/Interventions  Cryotherapy;Electrical Stimulation;Ultrasound;Moist Heat;Gait training;Functional mobility training;Therapeutic activities;Therapeutic exercise;Neuromuscular re-education;Scar mobilization;Balance training;Stair training;Patient/family education;Manual techniques    PT Next Visit Plan  Issue HEP/ strength training    PT Home Exercise Plan  See prescribed HEP.     Consulted and Agree with Plan of Care  Patient;Family member/caregiver       Patient will benefit from skilled therapeutic intervention in order to improve the following deficits and impairments:  Decreased endurance, Decreased range of motion, Decreased skin integrity, Decreased strength, Hypomobility, Impaired UE functional use, Increased fascial restricitons, Pain, Postural dysfunction, Impaired flexibility, Decreased scar mobility, Decreased mobility, Decreased balance, Improper body mechanics  Visit Diagnosis: 1. Muscle weakness (generalized)   2. Gait difficulty   3. Abnormal posture        Problem List Patient Active Problem List   Diagnosis Date Noted  . S/P reverse total shoulder arthroplasty, 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  . Chronic diastolic heart failure ([place]) 08/09/2016  . COPD (chronic obstructive pulmonary disease) ([place]) 08/09/2016  . Pulmonary infiltrates   . Pneumonia due to Streptococcus pneumoniae ([place])   . Fever   . Community acquired pneumonia of right lung   . Palliative care by specialist   . Goals of care, counseling/discussion   . Pressure injury of skin 07/03/2016  . COPD exacerbation ([place]) 07/21/2015  . Acute renal  insufficiency 07/21/2015  . Hyperglycemia 07/21/2015  . Benign essential HTN 07/21/2015  . S/P shoulder replacement 07/02/2013   Pura Spice, PT, DPT # [phone] 09/09/2018, 4:42 PM  [place] [facility] [address] [place], [name], [address] Phone: [phone]   Fax:  [phone]  Name: Hannah Powell MRN: [id number] Date of Birth: 02/23/1946

## 2018-09-10 ENCOUNTER — Other Ambulatory Visit: Payer: Self-pay

## 2018-09-10 ENCOUNTER — Encounter: Payer: Self-pay | Admitting: Physical Therapy

## 2018-09-10 ENCOUNTER — Ambulatory Visit: Payer: Medicare HMO | Admitting: Physical Therapy

## 2018-09-10 DIAGNOSIS — R269 Unspecified abnormalities of gait and mobility: Secondary | ICD-10-CM | POA: Diagnosis not present

## 2018-09-10 DIAGNOSIS — M6281 Muscle weakness (generalized): Secondary | ICD-10-CM

## 2018-09-10 DIAGNOSIS — R293 Abnormal posture: Secondary | ICD-10-CM | POA: Diagnosis not present

## 2018-09-10 NOTE — Therapy (Signed)
[facility] [address] [place], [name], [address] Phone: [phone]   Fax:  [phone]  Physical Therapy Treatment  Patient Details  Name: Hannah Powell MRN: [id number] Date of Birth: 31-Aug-1945 Referring Provider (PT): Dr. Ramonita Lab III   Encounter Date: 09/10/2018   Treatment: 3 of 8.  Recert date: 1/54/0086 1240 to 74   Past Medical History:  Diagnosis Date  . Anxiety   . Arthritis   . CHF (congestive heart failure) ([place])   . COPD (chronic obstructive pulmonary disease) ([place])   . Edema    feet/legs  . Hypertension    dr Otho Najjar     . Hypothyroidism   . Leg weakness, bilateral   . Osteoporosis   . Oxygen deficit    2l with bipap at night  . RLS (restless legs syndrome)   . Shortness of breath   . Sleep apnea    bipap  . Wheezing     Past Surgical History:  Procedure Laterality Date  . BACK SURGERY     cervical  . BREAST BIOPSY Left    needle bx-neg  . CATARACT EXTRACTION W/PHACO Left 05/07/2017   Procedure: CATARACT EXTRACTION PHACO AND INTRAOCULAR LENS PLACEMENT (IOC);  Surgeon: Birder Robson, MD;  Location: [facility];  Service: Ophthalmology;  Laterality: Left;  [place] 00:48.7 AP% 11.3 CDE 5.46 Fluid Pack Lot # [id number]  . CATARACT EXTRACTION W/PHACO Right 04/23/2017   Procedure: CATARACT EXTRACTION PHACO AND INTRAOCULAR LENS PLACEMENT ([place]);  Surgeon: Birder Robson, MD;  Location: [facility];  Service: Ophthalmology;  Laterality: Right;  [place] 00:29 AP% 12.8 CDE 3.79 FLUID PACK LOT # [id number]  . FOOT ARTHROPLASTY    . JOINT REPLACEMENT     x2 tkr  . TOTAL SHOULDER ARTHROPLASTY Left 07/02/2013   Procedure: LEFT TOTAL SHOULDER ARTHROPLASTY;  Surgeon: Marin Shutter, MD;  Location: [place];  Service: Orthopedics;  Laterality: Left;  . TOTAL SHOULDER ARTHROPLASTY Right 04/10/2018   Procedure: TOTAL REVERSE SHOULDER ARTHROPLASTY;  Surgeon: Justice Britain, MD;  [facility];  Service: Orthopedics;   Laterality: Right;  178min    There were no vitals filed for this visit.   Pt. reports no pain in LE but balance issues. Pt. states she feels L knee almost buckled this morning. Pt. states she is not trusting her LE, esp. with prolonged standing/walking tasks.      There.ex.:   Nustep L6 10 min. B UE/LE (warm-up). 6" step ups/ 3" step overs in //-bars (moderate cuing for posture correction)- alternating with L/R LE.   Standing hip ex.: high marching/ hip abduction/ heel raises/ knee flexion 20x each. Seated alt. UE/LE progressing to walking alt. UE/LE in //-bars Stand to sit with no UE assist (eccentric control)- 10x See HEP (handouts issued) Pt. Walked from [employer] to car in parking lot with no rest breaks.  Extra time and moderate cuing to correct posture/ head position.       PT Short Term Goals - 08/12/18 1236      PT SHORT TERM GOAL #1   Title  Pt. will increase AAROM with ER to 30 degrees in neutral  without increase in pain to regain independent self-care tasks.     Baseline  30 deg. ER on R.      Time  4    Period  Weeks    Status  Achieved    Target Date  06/05/18        PT Long  Term Goals - 09/08/18 0737      PT LONG TERM GOAL #1   Title  Pt. will increase FOTO to 56 to improve functional mobility.    Baseline  Initial FOTO: 51    Time  4    Period  Weeks    Status  New    Target Date  10/01/18      PT LONG TERM GOAL #2   Title  Pt. will be independent with HEP to increase B LE muscle strength 1/2 muscle grade to improve standing tolerance/ gait.    Baseline  B LE AROM: limited L ankle AROM as compared to R ankle. B LE muscle weakness: hip flexor 4/5 MMT, quads/ hamstring 4+/5 MMT, hip abduction 4+/5 MMT, hip adduction 4/5 MMT, ankle 4/5 MMT except PF 3+/5 MMT.    Time  4    Period  Weeks    Status  New    Target Date  10/01/18      PT LONG TERM GOAL #3   Title  Pt. will demonstrate improved upright posture for >15 minutes with standing ther.ex.     Baseline  Poor standing posture/ forward head/ rounded sh.    Time  4    Period  Weeks    Status  New    Target Date  10/01/18      PT LONG TERM GOAL #4   Title  Pt. able to ambulate outside with consistent hip flexion/ heel strike and least assistive device safely.    Baseline  Pt. ambulates with RW with limited hip flexion/ increase fall risk.    Time  4    Period  Weeks    Status  New    Target Date  10/01/18        Pt. continues to require max. cuing for posture correction with all standing/ walking tasks. Pt. able to stand from gray chair with min. to no UE assist safely. Pt. issued new HEP for beach trip but requires UE assist due to fear of L knee bucking/ limited stance time on L.       Patient will benefit from skilled therapeutic intervention in order to improve the following deficits and impairments:  Decreased endurance, Decreased range of motion, Decreased skin integrity, Decreased strength, Hypomobility, Impaired UE functional use, Increased fascial restricitons, Pain, Postural dysfunction, Impaired flexibility, Decreased scar mobility, Decreased mobility, Decreased balance, Improper body mechanics  Visit Diagnosis: 1. Muscle weakness (generalized)   2. Gait difficulty   3. Abnormal posture        Problem List Patient Active Problem List   Diagnosis Date Noted  . S/P reverse total shoulder arthroplasty, 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  . Chronic diastolic heart failure ([place]) 08/09/2016  . COPD (chronic obstructive pulmonary disease) ([place]) 08/09/2016  . Pulmonary infiltrates   . Pneumonia due to Streptococcus pneumoniae ([place])   . Fever   . Community acquired pneumonia of right lung   . Palliative care by specialist   . Goals of care, counseling/discussion   . Pressure injury of skin 07/03/2016  . COPD exacerbation ([place]) 07/21/2015  . Acute renal insufficiency 07/21/2015  . Hyperglycemia 07/21/2015  . Benign essential HTN 07/21/2015  . S/P shoulder replacement  07/02/2013   Pura Spice, PT, DPT # [phone] 09/13/2018, 12:21 PM  [place] [facility] [address] [place], [name], [address] Phone: [phone]   Fax:  [phone]  Name: Tikisha Molinaro MRN: [id number] Date of Birth: [birth year]

## 2018-09-10 NOTE — Patient Instructions (Signed)
Access Code: [email or web address]  URL: [email or web address]  Date: 09/10/2018  Prepared by: Dorcas Carrow   Exercises  Standing March with Unilateral Counter Support - 20 reps - 1 sets - 1x daily - 7x weekly  Standing Hip Abduction with Counter Support - 20 reps - 1 sets - 1x daily - 7x weekly  Heel rises with counter support - 20 reps - 1 sets - 1x daily - 7x weekly  Standing Knee Flexion with Counter Support - 20 reps - 1 sets - 1x daily - 7x weekly  Standing Tandem Balance with Counter Support - 3 reps - 1 sets - 20 hold - 1x daily - 7x weekly  Step Taps on High Step - 20 reps - 1 sets - 1x daily - 7x weekly

## 2018-09-12 DIAGNOSIS — H43812 Vitreous degeneration, left eye: Secondary | ICD-10-CM | POA: Diagnosis not present

## 2018-09-15 ENCOUNTER — Encounter: Payer: Medicare HMO | Admitting: Physical Therapy

## 2018-09-16 DIAGNOSIS — J449 Chronic obstructive pulmonary disease, unspecified: Secondary | ICD-10-CM | POA: Diagnosis not present

## 2018-09-17 ENCOUNTER — Encounter: Payer: Medicare HMO | Admitting: Physical Therapy

## 2018-09-22 ENCOUNTER — Other Ambulatory Visit: Payer: Self-pay

## 2018-09-22 ENCOUNTER — Ambulatory Visit: Payer: Medicare HMO | Admitting: Physical Therapy

## 2018-09-24 ENCOUNTER — Ambulatory Visit: Payer: Medicare HMO | Attending: Internal Medicine | Admitting: Physical Therapy

## 2018-09-24 ENCOUNTER — Encounter: Payer: Self-pay | Admitting: Physical Therapy

## 2018-09-24 ENCOUNTER — Other Ambulatory Visit: Payer: Self-pay

## 2018-09-24 DIAGNOSIS — R269 Unspecified abnormalities of gait and mobility: Secondary | ICD-10-CM | POA: Diagnosis not present

## 2018-09-24 DIAGNOSIS — M6281 Muscle weakness (generalized): Secondary | ICD-10-CM | POA: Insufficient documentation

## 2018-09-24 DIAGNOSIS — R293 Abnormal posture: Secondary | ICD-10-CM | POA: Insufficient documentation

## 2018-09-24 NOTE — Therapy (Signed)
[facility] [address] [place], [name], [address] Phone: [phone]   Fax:  [phone]  Physical Therapy Treatment  Patient Details  Name: Hannah Powell MRN: [id number] Date of Birth: 1945-09-02 Referring Provider (PT): Dr. Ramonita Lab III   Encounter Date: 09/24/2018  PT End of Session - 09/24/18 1259    Visit Number  4    Number of Visits  8    Date for PT Re-Evaluation  10/01/18    Authorization - Visit Number  4    Authorization - Number of Visits  10    PT Start Time  1324    PT Stop Time  1405    PT Time Calculation (min)  76 min    Equipment Utilized During Treatment  Gait belt    Activity Tolerance  Patient tolerated treatment well;Patient limited by fatigue    Behavior During Therapy  [facility] for tasks assessed/performed       Past Medical History:  Diagnosis Date  . Anxiety   . Arthritis   . CHF (congestive heart failure) ([place])   . COPD (chronic obstructive pulmonary disease) ([place])   . Edema    feet/legs  . Hypertension    dr Otho Najjar     . Hypothyroidism   . Leg weakness, bilateral   . Osteoporosis   . Oxygen deficit    2l with bipap at night  . RLS (restless legs syndrome)   . Shortness of breath   . Sleep apnea    bipap  . Wheezing     Past Surgical History:  Procedure Laterality Date  . BACK SURGERY     cervical  . BREAST BIOPSY Left    needle bx-neg  . CATARACT EXTRACTION W/PHACO Left 05/07/2017   Procedure: CATARACT EXTRACTION PHACO AND INTRAOCULAR LENS PLACEMENT (IOC);  Surgeon: Birder Robson, MD;  Location: [facility];  Service: Ophthalmology;  Laterality: Left;  [place] 00:48.7 AP% 11.3 CDE 5.46 Fluid Pack Lot # [id number]  . CATARACT EXTRACTION W/PHACO Right 04/23/2017   Procedure: CATARACT EXTRACTION PHACO AND INTRAOCULAR LENS PLACEMENT ([place]);  Surgeon: Birder Robson, MD;  Location: [facility];  Service: Ophthalmology;  Laterality: Right;  [place] 00:29 AP% 12.8 CDE 3.79 FLUID PACK LOT #  [id number]  . FOOT ARTHROPLASTY    . JOINT REPLACEMENT     x2 tkr  . TOTAL SHOULDER ARTHROPLASTY Left 07/02/2013   Procedure: LEFT TOTAL SHOULDER ARTHROPLASTY;  Surgeon: Marin Shutter, MD;  Location: [place];  Service: Orthopedics;  Laterality: Left;  . TOTAL SHOULDER ARTHROPLASTY Right 04/10/2018   Procedure: TOTAL REVERSE SHOULDER ARTHROPLASTY;  Surgeon: Justice Britain, MD;  Location: [facility];  Service: Orthopedics;  Laterality: Right;  180min    There were no vitals filed for this visit.  Subjective Assessment - 09/24/18 1244    Subjective  Pt. states her GI is feeling better.  No pain.  Pt. able [place] walk on sand at beach with forearm crutches.  Pt. reports good compliance with ex. at pool.    Patient is accompained by:  Family member    Pertinent History  extensive PMH including intensive care unit stays, rehab facility stays, previously ambulated with RW.. COPD, SOB, HTN. Denies CHF, reported she has been cleared by a cardiologist.    Limitations  Walking;Lifting;Standing;Writing;House hold activities    How long can you sit comfortably?  unlimited    Diagnostic tests  last x-rays of left knee where in November 2016 which looked good;  Patient Stated Goals  Increase B LE muscle strength/ safety with gait    Currently in Pain?  No/denies         There.ex.:   Nustep L6 10 min. B UE/LE (warm-up). Pt walked from Nustep around clinic gym to // bars with use of rollator.  Focus on upright posture/ step pattern. Sit to stands from bariatric chair: L UE assist with sit to stand/ No hands with stand to sit (slight difficulty controlling descent). Standing high marching toe taps with cone - Pt progressed to one hand for UE support  Standing hip abduction with cone taps- x20 each side. Standing semi-tandem - x2 minutes each side, without UE support Cues provided for posture. Standing hip extension - 3# 2x15 reps B. Cuing provided for upright posture. Standing hip abduction - 3# x15 B.   Pt.  Walked from PT gym to car in front of clinic with no rest breaks.    Discussed pool ex. At beach     PT Short Term Goals - 08/12/18 1236      PT SHORT TERM GOAL #1   Title  Pt. will increase AAROM with ER to 30 degrees in neutral  without increase in pain to regain independent self-care tasks.     Baseline  30 deg. ER on R.      Time  4    Period  Weeks    Status  Achieved    Target Date  06/05/18        PT Long Term Goals - 09/08/18 0737      PT LONG TERM GOAL #1   Title  Pt. will increase FOTO to 56 to improve functional mobility.    Baseline  Initial FOTO: 51    Time  4    Period  Weeks    Status  New    Target Date  10/01/18      PT LONG TERM GOAL #2   Title  Pt. will be independent with HEP to increase B LE muscle strength 1/2 muscle grade to improve standing tolerance/ gait.    Baseline  B LE AROM: limited L ankle AROM as compared to R ankle. B LE muscle weakness: hip flexor 4/5 MMT, quads/ hamstring 4+/5 MMT, hip abduction 4+/5 MMT, hip adduction 4/5 MMT, ankle 4/5 MMT except PF 3+/5 MMT.    Time  4    Period  Weeks    Status  New    Target Date  10/01/18      PT LONG TERM GOAL #3   Title  Pt. will demonstrate improved upright posture for >15 minutes with standing ther.ex.    Baseline  Poor standing posture/ forward head/ rounded sh.    Time  4    Period  Weeks    Status  New    Target Date  10/01/18      PT LONG TERM GOAL #4   Title  Pt. able to ambulate outside with consistent hip flexion/ heel strike and least assistive device safely.    Baseline  Pt. ambulates with RW with limited hip flexion/ increase fall risk.    Time  4    Period  Weeks    Status  New    Target Date  10/01/18         Plan - 09/24/18 1259    Clinical Impression Statement  Pt shows improvement with eccentric control of stand to sit today, but demonstrates poor quad control in last 10-15 degrees. Pt  able to perform standing toe tap exercises using one UE for assist but is  hesitant due to fear of falling/ L knee buckling.  Pt required frequent cues for upright posture with exercises. Pt will continue to benefit from further phyiscal therapy to improve LE strength and postural control.    Stability/Clinical Decision Making  Evolving/Moderate complexity    Clinical Decision Making  Moderate    Rehab Potential  Good    Clinical Impairments Affecting Rehab Potential  positive: motivation, family support, negative: co-morbidities, deconditioned, high fall risk; Body structures that will need to be addressed: weakness, numbness/tingling, impaired balance, difficulty walking, decreased transfer ability, etc; Due to co-morbidities her clinical presenstation is evolving;     PT Frequency  2x / week    PT Duration  4 weeks    PT Treatment/Interventions  Cryotherapy;Electrical Stimulation;Ultrasound;Moist Heat;Gait training;Functional mobility training;Therapeutic activities;Therapeutic exercise;Neuromuscular re-education;Scar mobilization;Balance training;Stair training;Patient/family education;Manual techniques    PT Next Visit Plan  LE strengthening ex./ balance tasks.    PT Home Exercise Plan  See prescribed HEP.     Consulted and Agree with Plan of Care  Patient;Family member/caregiver       Patient will benefit from skilled therapeutic intervention in order to improve the following deficits and impairments:  Decreased endurance, Decreased range of motion, Decreased skin integrity, Decreased strength, Hypomobility, Impaired UE functional use, Increased fascial restricitons, Pain, Postural dysfunction, Impaired flexibility, Decreased scar mobility, Decreased mobility, Decreased balance, Improper body mechanics  Visit Diagnosis: 1. Muscle weakness (generalized)   2. Gait difficulty   3. Abnormal posture        Problem List Patient Active Problem List   Diagnosis Date Noted  . S/P reverse total shoulder arthroplasty, 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  . Chronic diastolic heart  failure ([place]) 08/09/2016  . COPD (chronic obstructive pulmonary disease) ([place]) 08/09/2016  . Pulmonary infiltrates   . Pneumonia due to Streptococcus pneumoniae ([place])   . Fever   . Community acquired pneumonia of right lung   . Palliative care by specialist   . Goals of care, counseling/discussion   . Pressure injury of skin 07/03/2016  . COPD exacerbation ([place]) 07/21/2015  . Acute renal insufficiency 07/21/2015  . Hyperglycemia 07/21/2015  . Benign essential HTN 07/21/2015  . S/P shoulder replacement 07/02/2013   Pura Spice, PT, DPT # [id number] 09/24/2018, 2:16 PM  [facility] [address] [place], [name], [address] Phone: [phone]   Fax:  [phone]  Name: Gladyse Corvin MRN: [id number] Date of Birth: May 23, 1945

## 2018-09-26 ENCOUNTER — Other Ambulatory Visit: Payer: Self-pay

## 2018-09-26 ENCOUNTER — Encounter: Payer: Self-pay | Admitting: Physical Therapy

## 2018-09-26 ENCOUNTER — Ambulatory Visit: Payer: Medicare HMO | Admitting: Physical Therapy

## 2018-09-26 DIAGNOSIS — R293 Abnormal posture: Secondary | ICD-10-CM | POA: Diagnosis not present

## 2018-09-26 DIAGNOSIS — M6281 Muscle weakness (generalized): Secondary | ICD-10-CM | POA: Diagnosis not present

## 2018-09-26 DIAGNOSIS — R269 Unspecified abnormalities of gait and mobility: Secondary | ICD-10-CM

## 2018-09-26 NOTE — Therapy (Signed)
[place] [facility] [facility] [address] [place], [name], [address] Phone: [phone]   Fax:  [phone]  Physical Therapy Treatment  Patient Details  Name: Hannah Powell MRN: [id number] Date of Birth: 1945-12-01 Referring Provider (PT): Dr. Ramonita Lab III   Encounter Date: 09/26/2018  PT End of Session - 09/26/18 1135    Visit Number  5    Number of Visits  8    Date for PT Re-Evaluation  10/01/18    Authorization - Visit Number  5    Authorization - Number of Visits  10    PT Start Time  7564    PT Stop Time  1145    PT Time Calculation (min)  60 min    Equipment Utilized During Treatment  Gait belt    Activity Tolerance  Patient tolerated treatment well;Patient limited by fatigue    Behavior During Therapy  [facility] for tasks assessed/performed       Past Medical History:  Diagnosis Date  . Anxiety   . Arthritis   . CHF (congestive heart failure) ([place])   . COPD (chronic obstructive pulmonary disease) ([place])   . Edema    feet/legs  . Hypertension    dr Otho Najjar     . Hypothyroidism   . Leg weakness, bilateral   . Osteoporosis   . Oxygen deficit    2l with bipap at night  . RLS (restless legs syndrome)   . Shortness of breath   . Sleep apnea    bipap  . Wheezing     Past Surgical History:  Procedure Laterality Date  . BACK SURGERY     cervical  . BREAST BIOPSY Left    needle bx-neg  . CATARACT EXTRACTION W/PHACO Left 05/07/2017   Procedure: CATARACT EXTRACTION PHACO AND INTRAOCULAR LENS PLACEMENT (IOC);  Surgeon: Birder Robson, MD;  Location: [facility];  Service: Ophthalmology;  Laterality: Left;  [place] 00:48.7 AP% 11.3 CDE 5.46 Fluid Pack Lot # [id number]  . CATARACT EXTRACTION W/PHACO Right 04/23/2017   Procedure: CATARACT EXTRACTION PHACO AND INTRAOCULAR LENS PLACEMENT ([place]);  Surgeon: Birder Robson, MD;  Location: [facility];  Service: Ophthalmology;  Laterality: Right;  [place] 00:29 AP% 12.8 CDE 3.79 FLUID PACK LOT #  [id number]  . FOOT ARTHROPLASTY    . JOINT REPLACEMENT     x2 tkr  . TOTAL SHOULDER ARTHROPLASTY Left 07/02/2013   Procedure: LEFT TOTAL SHOULDER ARTHROPLASTY;  Surgeon: Marin Shutter, MD;  Location: [place];  Service: Orthopedics;  Laterality: Left;  . TOTAL SHOULDER ARTHROPLASTY Right 04/10/2018   Procedure: TOTAL REVERSE SHOULDER ARTHROPLASTY;  Surgeon: Justice Britain, MD;  [facility];  Service: Orthopedics;  Laterality: Right;  116min    There were no vitals filed for this visit.  Subjective Assessment - 09/26/18 1132    Subjective  Pt reports her legs are fatigued today because she "did a lot of walking yesterday." No pain reported.    Pertinent History  extensive PMH including intensive care unit stays, rehab facility stays, previously ambulated with RW.. COPD, SOB, HTN. Denies CHF, reported she has been cleared by a cardiologist.    Limitations  Walking;Lifting;Standing;Writing;House hold activities    How long can you sit comfortably?  unlimited    Diagnostic tests  last x-rays of left knee where in November 2016 which looked good;     Patient Stated Goals  Increase B LE muscle strength/ safety with gait    Currently in Pain?  No/denies  Multiple Pain Sites  No    Pain Score  0        Therapeutic Exercise:  Cone toe taps in // bars with B UE support - x20 B Modified SLB from airex pad to 6" step with UE support - 2x60 B. Cued for upright posture. Increased sway with L LE as stance leg. Seated, weighted knee extension - 2x20 B,  3# L, 5# R Seated heel raises - 2x20 B, 3# L, 5# R Lateral step ups onto airex pad with UE support - 3# L, 5#R. Cued for upright posture. Stand>sit- x4. Last rep pt required UE assist secondary to fatigue Nustep level 6 - 10 min. B UE/LE      PT Education - 09/26/18 1134    Education Details  Pt educated on maintaining upright posture to imporve balance    Person(s) Educated  Patient    Methods  Explanation;Demonstration    Comprehension   Returned demonstration;Verbalized understanding       PT Short Term Goals - 08/12/18 1236      PT SHORT TERM GOAL #1   Title  Pt. will increase AAROM with ER to 30 degrees in neutral  without increase in pain to regain independent self-care tasks.     Baseline  30 deg. ER on R.      Time  4    Period  Weeks    Status  Achieved    Target Date  06/05/18        PT Long Term Goals - 09/08/18 0737      PT LONG TERM GOAL #1   Title  Pt. will increase FOTO to 56 to improve functional mobility.    Baseline  Initial FOTO: 51    Time  4    Period  Weeks    Status  New    Target Date  10/01/18      PT LONG TERM GOAL #2   Title  Pt. will be independent with HEP to increase B LE muscle strength 1/2 muscle grade to improve standing tolerance/ gait.    Baseline  B LE AROM: limited L ankle AROM as compared to R ankle. B LE muscle weakness: hip flexor 4/5 MMT, quads/ hamstring 4+/5 MMT, hip abduction 4+/5 MMT, hip adduction 4/5 MMT, ankle 4/5 MMT except PF 3+/5 MMT.    Time  4    Period  Weeks    Status  New    Target Date  10/01/18      PT LONG TERM GOAL #3   Title  Pt. will demonstrate improved upright posture for >15 minutes with standing ther.ex.    Baseline  Poor standing posture/ forward head/ rounded sh.    Time  4    Period  Weeks    Status  New    Target Date  10/01/18      PT LONG TERM GOAL #4   Title  Pt. able to ambulate outside with consistent hip flexion/ heel strike and least assistive device safely.    Baseline  Pt. ambulates with RW with limited hip flexion/ increase fall risk.    Time  4    Period  Weeks    Status  New    Target Date  10/01/18         Plan - 09/26/18 1136    Clinical Impression Statement  Pt able to increase ankle weight used on R LE from 3#-5# with seated knee and ankle exercies and with standing  lateral stepping and SLB. Pt has increased postural sway on R LE during modified SLB exercises and requires B UE support. Pt lists forward when  attempting mdofied SLB exercise with only one UE for support. Pt reqiures frequent cues for all exercises to maintain upright posture. Pt fatigues quickly with standing marches, indicating decreased hip flexor strength. Pt requires CGA-min assist with all standing exercises. Pt will continue to benefit from further therapy to decrease fall risk, strengthen LEs and improve posture/gait mechanics.    Personal Factors and Comorbidities  --    Examination-Activity Limitations  --    Stability/Clinical Decision Making  Evolving/Moderate complexity    Clinical Decision Making  Moderate    Rehab Potential  Good    Clinical Impairments Affecting Rehab Potential  positive: motivation, family support, negative: co-morbidities, deconditioned, high fall risk; Body structures that will need to be addressed: weakness, numbness/tingling, impaired balance, difficulty walking, decreased transfer ability, etc; Due to co-morbidities her clinical presenstation is evolving;     PT Frequency  2x / week    PT Duration  4 weeks    PT Treatment/Interventions  Cryotherapy;Electrical Stimulation;Ultrasound;Moist Heat;Gait training;Functional mobility training;Therapeutic activities;Therapeutic exercise;Neuromuscular re-education;Scar mobilization;Balance training;Stair training;Patient/family education;Manual techniques    PT Next Visit Plan  LE strengthening ex./ static and dynamic balance: obstacle course    PT Home Exercise Plan  See prescribed HEP.     Consulted and Agree with Plan of Care  Patient       Patient will benefit from skilled therapeutic intervention in order to improve the following deficits and impairments:  Decreased endurance, Decreased range of motion, Decreased skin integrity, Decreased strength, Hypomobility, Impaired UE functional use, Increased fascial restricitons, Pain, Postural dysfunction, Impaired flexibility, Decreased scar mobility, Decreased mobility, Decreased balance, Improper body  mechanics  Visit Diagnosis: 1. Muscle weakness (generalized)   2. Gait difficulty   3. Abnormal posture        Problem List Patient Active Problem List   Diagnosis Date Noted  . S/P reverse total shoulder arthroplasty, 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  . Chronic diastolic heart failure ([place]) 08/09/2016  . COPD (chronic obstructive pulmonary disease) ([place]) 08/09/2016  . Pulmonary infiltrates   . Pneumonia due to Streptococcus pneumoniae ([place])   . Fever   . Community acquired pneumonia of right lung   . Palliative care by specialist   . Goals of care, counseling/discussion   . Pressure injury of skin 07/03/2016  . COPD exacerbation ([place]) 07/21/2015  . Acute renal insufficiency 07/21/2015  . Hyperglycemia 07/21/2015  . Benign essential HTN 07/21/2015  . S/P shoulder replacement 07/02/2013   Pura Spice, PT, DPT # [id number] 09/26/2018, 1:16 PM  [facility] [address] [place], [name], [address] Phone: [phone]   Fax:  [phone]  Name: Hannah Powell MRN: [id number] Date of Birth: [birth year]

## 2018-09-29 ENCOUNTER — Other Ambulatory Visit: Payer: Self-pay

## 2018-09-29 ENCOUNTER — Encounter: Payer: Self-pay | Admitting: Physical Therapy

## 2018-09-29 ENCOUNTER — Ambulatory Visit: Payer: Medicare HMO | Admitting: Physical Therapy

## 2018-09-29 DIAGNOSIS — M6281 Muscle weakness (generalized): Secondary | ICD-10-CM

## 2018-09-29 DIAGNOSIS — R293 Abnormal posture: Secondary | ICD-10-CM | POA: Diagnosis not present

## 2018-09-29 DIAGNOSIS — R269 Unspecified abnormalities of gait and mobility: Secondary | ICD-10-CM

## 2018-09-29 NOTE — Therapy (Signed)
[facility] [address] [place], [name], [address] Phone: [phone]   Fax:  [phone]  Physical Therapy Treatment  Patient Details  Name: Hannah Powell MRN: [id number] Date of Birth: [birth year] Referring Provider (PT): Dr. Ramonita Lab III   Encounter Date: 09/29/2018  PT End of Session - 09/29/18 1457    Visit Number  6    Number of Visits  8    Date for PT Re-Evaluation  10/01/18    Authorization - Visit Number  6    Authorization - Number of Visits  10    PT Start Time  9675    PT Stop Time  1348    PT Time Calculation (min)  59 min    Equipment Utilized During Treatment  Gait belt    Activity Tolerance  Patient tolerated treatment well;Patient limited by fatigue    Behavior During Therapy  [facility] for tasks assessed/performed       Past Medical History:  Diagnosis Date  . Anxiety   . Arthritis   . CHF (congestive heart failure) ([place])   . COPD (chronic obstructive pulmonary disease) ([place])   . Edema    feet/legs  . Hypertension    dr Otho Najjar     . Hypothyroidism   . Leg weakness, bilateral   . Osteoporosis   . Oxygen deficit    2l with bipap at night  . RLS (restless legs syndrome)   . Shortness of breath   . Sleep apnea    bipap  . Wheezing     Past Surgical History:  Procedure Laterality Date  . BACK SURGERY     cervical  . BREAST BIOPSY Left    needle bx-neg  . CATARACT EXTRACTION W/PHACO Left 05/07/2017   Procedure: CATARACT EXTRACTION PHACO AND INTRAOCULAR LENS PLACEMENT (IOC);  Surgeon: Birder Robson, MD;  Location: [facility];  Service: Ophthalmology;  Laterality: Left;  [place] 00:48.7 AP% 11.3 CDE 5.46 Fluid Pack Lot # [id number]  . CATARACT EXTRACTION W/PHACO Right 04/23/2017   Procedure: CATARACT EXTRACTION PHACO AND INTRAOCULAR LENS PLACEMENT ([place]);  Surgeon: Birder Robson, MD;  Location: [facility];  Service: Ophthalmology;  Laterality: Right;  [place] 00:29 AP% 12.8 CDE 3.79 FLUID PACK LOT #  [id number]  . FOOT ARTHROPLASTY    . JOINT REPLACEMENT     x2 tkr  . TOTAL SHOULDER ARTHROPLASTY Left 07/02/2013   Procedure: LEFT TOTAL SHOULDER ARTHROPLASTY;  Surgeon: Marin Shutter, MD;  Location: [place];  Service: Orthopedics;  Laterality: Left;  . TOTAL SHOULDER ARTHROPLASTY Right 04/10/2018   Procedure: TOTAL REVERSE SHOULDER ARTHROPLASTY;  Surgeon: Justice Britain, MD;  Location[facility];  Service: Orthopedics;  Laterality: Right;  153min    There were no vitals filed for this visit.  Subjective Assessment - 09/29/18 1234    Subjective  Pt states her legs are "weak as water today." Pt reports she had an active weekend.    Pertinent History  extensive PMH including intensive care unit stays, rehab facility stays, previously ambulated with RW.. COPD, SOB, HTN. Denies CHF, reported she has been cleared by a cardiologist.    Limitations  Walking;Lifting;Standing;Writing;House hold activities    How long can you sit comfortably?  unlimited    Diagnostic tests  last x-rays of left knee where in November 2016 which looked good;     Patient Stated Goals  Increase B LE muscle strength/ safety with gait    Currently in Pain?  No/denies  Therapeutic exercise:  Seated shoulder flexion with wand: Cuing provided at upper traps. 20x Standing RTB rows: 1x20. Cue for upright posture. Stand>sit: x4 one UE assist. Cuing for eccentric control LE resisted ex.: R 5#/ L 4#:  Seated LAQ x40/ standing marching x4 in // bars/ lateral walking x4 (UE assit required)- cuing for posture/mirror feedback. Seated heel raises - 20x with wts. Walking in clinic with use of rollator and cuing/instruction to maintain upright posture/  Supine pec stretch- 2x30 sec at 90 degrees shoulder abduction, x60 sec at 120 degrees shoulder abduction SLR- 2x10 B Glute bridge - 2x10  Nustep L6 B UE/LE 10 min. (consistent cadence)       PT Short Term Goals - 08/12/18 1236      PT SHORT TERM GOAL #1   Title  Pt. will  increase AAROM with ER to 30 degrees in neutral  without increase in pain to regain independent self-care tasks.     Baseline  30 deg. ER on R.      Time  4    Period  Weeks    Status  Achieved    Target Date  06/05/18        PT Long Term Goals - 09/08/18 0737      PT LONG TERM GOAL #1   Title  Pt. will increase FOTO to 56 to improve functional mobility.    Baseline  Initial FOTO: 51    Time  4    Period  Weeks    Status  New    Target Date  10/01/18      PT LONG TERM GOAL #2   Title  Pt. will be independent with HEP to increase B LE muscle strength 1/2 muscle grade to improve standing tolerance/ gait.    Baseline  B LE AROM: limited L ankle AROM as compared to R ankle. B LE muscle weakness: hip flexor 4/5 MMT, quads/ hamstring 4+/5 MMT, hip abduction 4+/5 MMT, hip adduction 4/5 MMT, ankle 4/5 MMT except PF 3+/5 MMT.    Time  4    Period  Weeks    Status  New    Target Date  10/01/18      PT LONG TERM GOAL #3   Title  Pt. will demonstrate improved upright posture for >15 minutes with standing ther.ex.    Baseline  Poor standing posture/ forward head/ rounded sh.    Time  4    Period  Weeks    Status  New    Target Date  10/01/18      PT LONG TERM GOAL #4   Title  Pt. able to ambulate outside with consistent hip flexion/ heel strike and least assistive device safely.    Baseline  Pt. ambulates with RW with limited hip flexion/ increase fall risk.    Time  4    Period  Weeks    Status  New    Target Date  10/01/18         Plan - 09/29/18 1457    Clinical Impression Statement  Pt. continues to work hard during tx. session but significant B LE muscle fatigue noted.  Sit to stands required extra time and required use of B UE assist for safety.  Frequent cuing for proper posture and head position.  Pt. required mod. A from SPT to place L foot on Nustep footplate.  PT will reassess goals and discuss POC next tx. session.    Stability/Clinical Decision Making   Evolving/Moderate  complexity    Clinical Decision Making  Moderate    Rehab Potential  Good    Clinical Impairments Affecting Rehab Potential  positive: motivation, family support, negative: co-morbidities, deconditioned, high fall risk; Body structures that will need to be addressed: weakness, numbness/tingling, impaired balance, difficulty walking, decreased transfer ability, etc; Due to co-morbidities her clinical presenstation is evolving;     PT Frequency  2x / week    PT Duration  4 weeks    PT Treatment/Interventions  Cryotherapy;Electrical Stimulation;Ultrasound;Moist Heat;Gait training;Functional mobility training;Therapeutic activities;Therapeutic exercise;Neuromuscular re-education;Scar mobilization;Balance training;Stair training;Patient/family education;Manual techniques    PT Next Visit Plan  LE strengthening ex./ static and dynamic balance: obstacle course.   REASSESS GOALS NEXT VISIT    PT Home Exercise Plan  See prescribed HEP.     Consulted and Agree with Plan of Care  Patient       Patient will benefit from skilled therapeutic intervention in order to improve the following deficits and impairments:  Decreased endurance, Decreased range of motion, Decreased skin integrity, Decreased strength, Hypomobility, Impaired UE functional use, Increased fascial restricitons, Pain, Postural dysfunction, Impaired flexibility, Decreased scar mobility, Decreased mobility, Decreased balance, Improper body mechanics  Visit Diagnosis: 1. Muscle weakness (generalized)   2. Gait difficulty   3. Abnormal posture        Problem List Patient Active Problem List   Diagnosis Date Noted  . S/P reverse total shoulder arthroplasty, 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  . Chronic diastolic heart failure ([place]) 08/09/2016  . COPD (chronic obstructive pulmonary disease) ([place]) 08/09/2016  . Pulmonary infiltrates   . Pneumonia due to Streptococcus pneumoniae ([place])   . Fever   . Community acquired pneumonia of right  lung   . Palliative care by specialist   . Goals of care, counseling/discussion   . Pressure injury of skin 07/03/2016  . COPD exacerbation ([place]) 07/21/2015  . Acute renal insufficiency 07/21/2015  . Hyperglycemia 07/21/2015  . Benign essential HTN 07/21/2015  . S/P shoulder replacement 07/02/2013   Pura Spice, PT, DPT # [id number] 09/29/2018, 3:02 PM  [place] [facility] [address] [place], [name], [address] Phone: [phone]   Fax:  [phone]  Name: Hannah Powell MRN: [id number] Date of Birth: Dec 04, 1945

## 2018-10-01 ENCOUNTER — Ambulatory Visit: Payer: Medicare HMO | Admitting: Physical Therapy

## 2018-10-01 ENCOUNTER — Other Ambulatory Visit: Payer: Self-pay

## 2018-10-01 ENCOUNTER — Encounter: Payer: Self-pay | Admitting: Physical Therapy

## 2018-10-01 DIAGNOSIS — R269 Unspecified abnormalities of gait and mobility: Secondary | ICD-10-CM

## 2018-10-01 DIAGNOSIS — M6281 Muscle weakness (generalized): Secondary | ICD-10-CM

## 2018-10-01 DIAGNOSIS — R293 Abnormal posture: Secondary | ICD-10-CM | POA: Diagnosis not present

## 2018-10-01 NOTE — Therapy (Signed)
[facility] [address] [place], [name], [address] Phone: [phone]   Fax:  [phone]  Physical Therapy Treatment  Patient Details  Name: Hannah Powell MRN: [id number] Date of Birth: [birth year] Referring Provider (PT): Dr. Ramonita Lab III   Encounter Date: 10/01/2018  PT End of Session - 10/01/18 1348    Visit Number  7    Number of Visits  8    Date for PT Re-Evaluation  10/01/18    Authorization - Visit Number  7    Authorization - Number of Visits  10    PT Start Time  9450    PT Stop Time  1333    PT Time Calculation (min)  62 min    Equipment Utilized During Treatment  Gait belt    Activity Tolerance  Patient tolerated treatment well;Patient limited by fatigue    Behavior During Therapy  [facility] for tasks assessed/performed       Past Medical History:  Diagnosis Date  . Anxiety   . Arthritis   . CHF (congestive heart failure) ([place])   . COPD (chronic obstructive pulmonary disease) ([place])   . Edema    feet/legs  . Hypertension    dr Otho Najjar     . Hypothyroidism   . Leg weakness, bilateral   . Osteoporosis   . Oxygen deficit    2l with bipap at night  . RLS (restless legs syndrome)   . Shortness of breath   . Sleep apnea    bipap  . Wheezing     Past Surgical History:  Procedure Laterality Date  . BACK SURGERY     cervical  . BREAST BIOPSY Left    needle bx-neg  . CATARACT EXTRACTION W/PHACO Left 05/07/2017   Procedure: CATARACT EXTRACTION PHACO AND INTRAOCULAR LENS PLACEMENT (IOC);  Surgeon: Birder Robson, MD;  Location: [facility];  Service: Ophthalmology;  Laterality: Left;  [place] 00:48.7 AP% 11.3 CDE 5.46 Fluid Pack Lot # [id number]  . CATARACT EXTRACTION W/PHACO Right 04/23/2017   Procedure: CATARACT EXTRACTION PHACO AND INTRAOCULAR LENS PLACEMENT ([place]);  Surgeon: Birder Robson, MD;  Location: [facility];  Service: Ophthalmology;  Laterality: Right;  [place] 00:29 AP% 12.8 CDE 3.79 FLUID PACK LOT #  [id number]  . FOOT ARTHROPLASTY    . JOINT REPLACEMENT     x2 tkr  . TOTAL SHOULDER ARTHROPLASTY Left 07/02/2013   Procedure: LEFT TOTAL SHOULDER ARTHROPLASTY;  Surgeon: Marin Shutter, MD;  Location: [place];  Service: Orthopedics;  Laterality: Left;  . TOTAL SHOULDER ARTHROPLASTY Right 04/10/2018   Procedure: TOTAL REVERSE SHOULDER ARTHROPLASTY;  Surgeon: Justice Britain, MD;  [facility];  Service: Orthopedics;  Laterality: Right;  162min    There were no vitals filed for this visit.  Subjective Assessment - 10/01/18 1240    Subjective  Pt reports mild shoulder soreness after last PT session. Pt reports no pain today but that her LEs "feel tired."    Pertinent History  extensive PMH including intensive care unit stays, rehab facility stays, previously ambulated with RW.. COPD, SOB, HTN. Denies CHF, reported she has been cleared by a cardiologist.    Limitations  Walking;Lifting;Standing;Writing;House hold activities    How long can you sit comfortably?  unlimited    Diagnostic tests  last x-rays of left knee where in November 2016 which looked good;     Patient Stated Goals  Increase B LE muscle strength/ safety with gait    Currently in Pain?  No/denies        Therapeutic Exercise:  Nustep LE/UE level 7 x10 min Seated stability ball roll outs - x10 with 5 second hold Standing step-ups 2x20 with 5# ankle weights. Cuing provided for posture. Seated knee extension x20 5# ankle weights. Standing hip abduction x20 with 5# ankle weights Standing hip extension x20 with 5# ankle weights Seated marches x20 with 5# ankle weights  Seated RTB rows - x20 Standing RTB Rows - x20. Cuing provided.  Standing RTB pull downs - x20 Cuing provided.  Standing RTB pull-apart- x20.    Neuromuscular Re-ed:  Semi-tandem in // bars - multiple bouts of 30 sec, B LEs with intermittent UE support and CGA Standing feet together in // bars - 2x30 sec CGA Standing feet together in // bars on foam - 2x30  sec  SLB with ball in // bars - x20 B      PT Education - 10/01/18 1340    Education provided  Yes    Education Details  See HEP    Person(s) Educated  Patient;Spouse    Methods  Explanation;Demonstration;Verbal cues;Handout    Comprehension  Verbalized understanding;Returned demonstration       PT Short Term Goals - 08/12/18 1236      PT SHORT TERM GOAL #1   Title  Pt. will increase AAROM with ER to 30 degrees in neutral  without increase in pain to regain independent self-care tasks.     Baseline  30 deg. ER on R.      Time  4    Period  Weeks    Status  Achieved    Target Date  06/05/18        PT Long Term Goals - 09/08/18 0737      PT LONG TERM GOAL #1   Title  Pt. will increase FOTO to 56 to improve functional mobility.    Baseline  Initial FOTO: 51    Time  4    Period  Weeks    Status  New    Target Date  10/01/18      PT LONG TERM GOAL #2   Title  Pt. will be independent with HEP to increase B LE muscle strength 1/2 muscle grade to improve standing tolerance/ gait.    Baseline  B LE AROM: limited L ankle AROM as compared to R ankle. B LE muscle weakness: hip flexor 4/5 MMT, quads/ hamstring 4+/5 MMT, hip abduction 4+/5 MMT, hip adduction 4/5 MMT, ankle 4/5 MMT except PF 3+/5 MMT.    Time  4    Period  Weeks    Status  New    Target Date  10/01/18      PT LONG TERM GOAL #3   Title  Pt. will demonstrate improved upright posture for >15 minutes with standing ther.ex.    Baseline  Poor standing posture/ forward head/ rounded sh.    Time  4    Period  Weeks    Status  New    Target Date  10/01/18      PT LONG TERM GOAL #4   Title  Pt. able to ambulate outside with consistent hip flexion/ heel strike and least assistive device safely.    Baseline  Pt. ambulates with RW with limited hip flexion/ increase fall risk.    Time  4    Period  Weeks    Status  New    Target Date  10/01/18  Plan - 10/01/18 1349    Clinical Impression Statement  Pt  able to progress standing LE exercises by increasing ankle weights to 5# B for multiple reps. Pt continues to require frequent cuing for upright posture with all exercises and fatigues quickly in upright position. Pt with decreased strength of L LE>R and required SPT to pick L LE up to nustep pedal. Pt has difficulty with maintaining static balance in semi-tandem stance without intermittent UE support and CGA. Pt will continue to benefit from further skilled therapy to decrease risk of falls and increase LE strength.    Stability/Clinical Decision Making  Evolving/Moderate complexity    Clinical Decision Making  Moderate    Rehab Potential  Good    Clinical Impairments Affecting Rehab Potential  positive: motivation, family support, negative: co-morbidities, deconditioned, high fall risk; Body structures that will need to be addressed: weakness, numbness/tingling, impaired balance, difficulty walking, decreased transfer ability, etc; Due to co-morbidities her clinical presenstation is evolving;     PT Frequency  2x / week    PT Duration  4 weeks    PT Treatment/Interventions  Cryotherapy;Electrical Stimulation;Ultrasound;Moist Heat;Gait training;Functional mobility training;Therapeutic activities;Therapeutic exercise;Neuromuscular re-education;Scar mobilization;Balance training;Stair training;Patient/family education;Manual techniques    PT Next Visit Plan  LE strengthening ex./ progress static and dynamic balance exercises.  REASSESS GOALS NEXT TX    PT Home Exercise Plan  See prescribed HEP.     Consulted and Agree with Plan of Care  Patient       Patient will benefit from skilled therapeutic intervention in order to improve the following deficits and impairments:  Decreased endurance, Decreased range of motion, Decreased skin integrity, Decreased strength, Hypomobility, Impaired UE functional use, Increased fascial restricitons, Pain, Postural dysfunction, Impaired flexibility, Decreased scar  mobility, Decreased mobility, Decreased balance, Improper body mechanics  Visit Diagnosis: 1. Muscle weakness (generalized)   2. Gait difficulty   3. Abnormal posture        Problem List Patient Active Problem List   Diagnosis Date Noted  . S/P reverse total shoulder arthroplasty, 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  . Chronic diastolic heart failure ([place]) 08/09/2016  . COPD (chronic obstructive pulmonary disease) ([place]) 08/09/2016  . Pulmonary infiltrates   . Pneumonia due to Streptococcus pneumoniae ([place])   . Fever   . Community acquired pneumonia of right lung   . Palliative care by specialist   . Goals of care, counseling/discussion   . Pressure injury of skin 07/03/2016  . COPD exacerbation ([place]) 07/21/2015  . Acute renal insufficiency 07/21/2015  . Hyperglycemia 07/21/2015  . Benign essential HTN 07/21/2015  . S/P shoulder replacement 07/02/2013   Pura Spice, PT, DPT # [id number] 10/01/2018, 2:51 PM  [facility] [address] [place], [name], [address] Phone: [phone]   Fax:  [phone]  Name: Epsie Walthall MRN: [id number] Date of Birth: 01/07/1946

## 2018-10-01 NOTE — Patient Instructions (Signed)
Access Code: [email or web address]  URL: [email or web address]  Date: 10/01/2018  Prepared by: Dorcas Carrow   Exercises  Forward Step Up with Counter Support - 15 reps - 3 sets - 1x daily - 4x weekly  Standing Hip Extension with Counter Support - 20 reps - 1 sets - 1x daily - 4x weekly  Standing Hip Abduction with Counter Support - 20 reps - 1 sets - 1x daily - 4x weekly  Scapular Retraction with Resistance - 20 reps - 2 sets - 1x daily - 4x weekly  Shoulder W - External Rotation with Resistance - 30 reps - 4 sets - 1x daily - 4x weekly  Standing Tandem Balance with Counter Support - 1 reps - 4 sets - 30 hold - 1x daily - 4x weekly

## 2018-10-03 DIAGNOSIS — R0689 Other abnormalities of breathing: Secondary | ICD-10-CM | POA: Diagnosis not present

## 2018-10-03 DIAGNOSIS — J449 Chronic obstructive pulmonary disease, unspecified: Secondary | ICD-10-CM | POA: Diagnosis not present

## 2018-10-03 DIAGNOSIS — I509 Heart failure, unspecified: Secondary | ICD-10-CM | POA: Diagnosis not present

## 2018-10-03 DIAGNOSIS — R0902 Hypoxemia: Secondary | ICD-10-CM | POA: Diagnosis not present

## 2018-10-06 ENCOUNTER — Ambulatory Visit: Payer: Medicare HMO | Admitting: Physical Therapy

## 2018-10-06 ENCOUNTER — Encounter: Payer: Self-pay | Admitting: Physical Therapy

## 2018-10-06 ENCOUNTER — Other Ambulatory Visit: Payer: Self-pay

## 2018-10-06 DIAGNOSIS — R293 Abnormal posture: Secondary | ICD-10-CM

## 2018-10-06 DIAGNOSIS — R269 Unspecified abnormalities of gait and mobility: Secondary | ICD-10-CM

## 2018-10-06 DIAGNOSIS — M6281 Muscle weakness (generalized): Secondary | ICD-10-CM | POA: Diagnosis not present

## 2018-10-06 DIAGNOSIS — Z471 Aftercare following joint replacement surgery: Secondary | ICD-10-CM | POA: Diagnosis not present

## 2018-10-06 DIAGNOSIS — Z96611 Presence of right artificial shoulder joint: Secondary | ICD-10-CM | POA: Diagnosis not present

## 2018-10-06 NOTE — Therapy (Signed)
[facility] [address] [place], [name], [address] Phone: [phone]   Fax:  [phone]  Physical Therapy Treatment  Patient Details  Name: Hannah Powell MRN: [id number] Date of Birth: 1945/07/07 Referring Provider (PT): Dr. Ramonita Lab III   Encounter Date: 10/06/2018  PT End of Session - 10/06/18 1256    Visit Number  8    Number of Visits  16    Date for PT Re-Evaluation  11/03/18    Authorization - Visit Number  1    Authorization - Number of Visits  10    PT Start Time  2707    PT Stop Time  1334    PT Time Calculation (min)  48 min    Equipment Utilized During Treatment  Gait belt    Activity Tolerance  Patient tolerated treatment well;Patient limited by fatigue    Behavior During Therapy  [facility] for tasks assessed/performed       Past Medical History:  Diagnosis Date  . Anxiety   . Arthritis   . CHF (congestive heart failure) ([place])   . COPD (chronic obstructive pulmonary disease) ([place])   . Edema    feet/legs  . Hypertension    dr Otho Najjar     . Hypothyroidism   . Leg weakness, bilateral   . Osteoporosis   . Oxygen deficit    2l with bipap at night  . RLS (restless legs syndrome)   . Shortness of breath   . Sleep apnea    bipap  . Wheezing     Past Surgical History:  Procedure Laterality Date  . BACK SURGERY     cervical  . BREAST BIOPSY Left    needle bx-neg  . CATARACT EXTRACTION W/PHACO Left 05/07/2017   Procedure: CATARACT EXTRACTION PHACO AND INTRAOCULAR LENS PLACEMENT (IOC);  Surgeon: Birder Robson, MD;  Location: [facility];  Service: Ophthalmology;  Laterality: Left;  [place] 00:48.7 AP% 11.3 CDE 5.46 Fluid Pack Lot # [id number]  . CATARACT EXTRACTION W/PHACO Right 04/23/2017   Procedure: CATARACT EXTRACTION PHACO AND INTRAOCULAR LENS PLACEMENT ([place]);  Surgeon: Birder Robson, MD;  Location: [facility];  Service: Ophthalmology;  Laterality: Right;  [place] 00:29 AP% 12.8 CDE 3.79 FLUID PACK LOT #  [id number]  . FOOT ARTHROPLASTY    . JOINT REPLACEMENT     x2 tkr  . TOTAL SHOULDER ARTHROPLASTY Left 07/02/2013   Procedure: LEFT TOTAL SHOULDER ARTHROPLASTY;  Surgeon: Marin Shutter, MD;  Location: [place];  Service: Orthopedics;  Laterality: Left;  . TOTAL SHOULDER ARTHROPLASTY Right 04/10/2018   Procedure: TOTAL REVERSE SHOULDER ARTHROPLASTY;  Surgeon: Justice Britain, MD;  Location: [facility];  Service: Orthopedics;  Laterality: Right;  165mn    There were no vitals filed for this visit.  Subjective Assessment - 10/06/18 1252    Subjective  Pt. [place] to [place] this past weekend for a day trip.  Pt. has f/u appt. with Ortho MD this morning and MD is happy with shoulder progress.  No reports no pain and entered PT with use of RW today, not rollator.    Pertinent History  extensive PMH including intensive care unit stays, rehab facility stays, previously ambulated with RW.. COPD, SOB, HTN. Denies CHF, reported she has been cleared by a cardiologist.    Limitations  Walking;Lifting;Standing;Writing;House hold activities    How long can you sit comfortably?  unlimited    Diagnostic tests  last x-rays of left knee where in November 2016 which looked  good;     Patient Stated Goals  Increase B LE muscle strength/ safety with gait    Currently in Pain?  No/denies       FOTOHulan Fess PT Assessment - 10/06/18 0001      Assessment   Medical Diagnosis  Bilateral leg weakness/ Gait difficulty    Referring Provider (PT)  Dr. Ramonita Lab III    Onset Date/Surgical Date  03/19/18    Hand Dominance  Right    Prior Therapy  Pt. known to PT clinic      Precautions   Precautions  Fall       There.ex.:  Nustep L7 10 min. B UE/LE (consistent cadence/ cuing to increase knee extension)- warm-up Reassessed MMT (see goals) Standing GTB ex.: scap. Retraction/ ER/ sh. Extension 20x (BOS with no UE assist on //-bars)- no LOB 4# LE ex.: seated LAQ/ marching/ standing 4-way hip (20x each)- mirror  feedback  Gait training:  Walking with RW on agility ladder (4# ankle wts.) with focus on increase step pattern/length.  Improved hip flexion/ heel strike noted with cuing and use of mirror for posture correction.   Walking outside with RW with instruction on proper heel strike/ toe off and step length.  Cuing t/o tx to correct upright posture/ mirror used t/o tx.   PT Education - 10/06/18 1512    Education provided  Yes    Education Details  Pt educated on results of goals assessment.    Person(s) Educated  Patient    Methods  Explanation    Comprehension  Verbalized understanding       PT Short Term Goals - 08/12/18 1236      PT SHORT TERM GOAL #1   Title  Pt. will increase AAROM with ER to 30 degrees in neutral  without increase in pain to regain independent self-care tasks.     Baseline  30 deg. ER on R.      Time  4    Period  Weeks    Status  Achieved    Target Date  06/05/18        PT Long Term Goals - 10/06/18 1513      PT LONG TERM GOAL #1   Title  Pt. will increase FOTO to 56 to improve functional mobility.    Baseline  Initial FOTO: 51; FOTO 57 10/06/2018    Time  4    Period  Weeks    Status  Achieved    Target Date  10/06/18      PT LONG TERM GOAL #2   Title  Pt. will be independent with HEP to increase B LE muscle strength 1/2 muscle grade to improve standing tolerance/ gait.    Baseline  B LE AROM: limited L ankle AROM as compared to R ankle. B LE muscle weakness: hip flexor 4/5 MMT, quads/ hamstring 4+/5 MMT, hip abduction 4+/5 MMT, hip adduction 4/5 MMT, ankle 4/5 MMT except PF 3+/5 MMT.; L/R: Hip flexion 4/5, 4+/5; Knee extension 5/5 B Knee flexion 5/5 B' DF 5/5 B 10/06/2018    Time  4    Period  Weeks    Status  Partially Met    Target Date  11/03/18      PT LONG TERM GOAL #3   Title  Pt. will demonstrate improved upright posture for >15 minutes with standing ther.ex.    Baseline  Poor standing posture/ forward head/ rounded sh; Pt maintained  improved upright posture for 10 minutes  before requiring cuing 10/06/2018    Time  4    Period  Weeks    Status  Partially Met    Target Date  11/03/18      PT LONG TERM GOAL #4   Title  Pt. able to ambulate outside with consistent hip flexion/ heel strike and least assistive device safely.    Baseline  Pt. ambulates with RW with limited hip flexion/ increase fall risk; Pt with improved heelstrike, but inconsistend hip flexion 10/06/2018    Time  4    Period  Weeks    Status  Partially Met    Target Date  11/03/18      PT LONG TERM GOAL #5   Title  Pt will ambulate with consistent reciprocal step pattern 100% of the time with minor cuing instead of step-to gait pattern with walker to improve mobility gait mechanics.    Baseline  Pt requires cuing ~80% of the time to increase step length.    Time  4    Period  Weeks    Status  New    Target Date  11/03/18          Plan - 10/06/18 1525    Clinical Impression Statement  Pt demonstrates progress with goals today: meeting her FOTO goal with a score of 57. Pt also showed gains with majority LE strength test: L/R: Hip flexion 4/5, 4+/5; Knee extension 5/5 B' Knee flexion 5/5 B' DF 5/5 B. Pt still has strength deficits of hip flexors with L<R. Pt able to complete 25 repitions of UE supported heel raises, but demonstrates limited plantarflexion ROM. Pt continues to require frequent cuing to improve upright posture, heel strike and step length with gait. Pt will continue to benefit from further skilled therapy to improve LE strength and gait mechanics.    Stability/Clinical Decision Making  Evolving/Moderate complexity    Rehab Potential  Good    Clinical Impairments Affecting Rehab Potential  positive: motivation, family support, negative: co-morbidities, deconditioned, high fall risk; Body structures that will need to be addressed: weakness, numbness/tingling, impaired balance, difficulty walking, decreased transfer ability, etc; Due to  co-morbidities her clinical presenstation is evolving;     PT Frequency  2x / week    PT Duration  4 weeks    PT Treatment/Interventions  Cryotherapy;Electrical Stimulation;Ultrasound;Moist Heat;Gait training;Functional mobility training;Therapeutic activities;Therapeutic exercise;Neuromuscular re-education;Scar mobilization;Balance training;Stair training;Patient/family education;Manual techniques    PT Next Visit Plan  LE strengthening ex./ progress static and dynamic balance exercises. Progress gait exercises.    PT Home Exercise Plan  See prescribed HEP.     Consulted and Agree with Plan of Care  Patient       Patient will benefit from skilled therapeutic intervention in order to improve the following deficits and impairments:  Decreased endurance, Decreased range of motion, Decreased skin integrity, Decreased strength, Hypomobility, Impaired UE functional use, Increased fascial restricitons, Pain, Postural dysfunction, Impaired flexibility, Decreased scar mobility, Decreased mobility, Decreased balance, Improper body mechanics  Visit Diagnosis: 1. Muscle weakness (generalized)   2. Gait difficulty   3. Abnormal posture        Problem List Patient Active Problem List   Diagnosis Date Noted  . S/P reverse total shoulder arthroplasty, 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  . Chronic diastolic heart failure ([place]) 08/09/2016  . COPD (chronic obstructive pulmonary disease) ([place]) 08/09/2016  . Pulmonary infiltrates   . Pneumonia due to Streptococcus pneumoniae ([place])   . Fever   . Community acquired pneumonia of right lung   .  Palliative care by specialist   . Goals of care, counseling/discussion   . Pressure injury of skin 07/03/2016  . COPD exacerbation ([place]) 07/21/2015  . Acute renal insufficiency 07/21/2015  . Hyperglycemia 07/21/2015  . Benign essential HTN 07/21/2015  . S/P shoulder replacement 07/02/2013   Pura Spice, PT, DPT # [place], SPT 10/06/2018, 3:34 PM  [facility] [address] [place], [name], [address] Phone: [phone]   Fax:  [phone]  Name: Teffany Blaszczyk MRN: [id number] Date of Birth: Dec 31, 1945

## 2018-10-08 ENCOUNTER — Other Ambulatory Visit: Payer: Self-pay

## 2018-10-08 ENCOUNTER — Encounter: Payer: Self-pay | Admitting: Physical Therapy

## 2018-10-08 ENCOUNTER — Ambulatory Visit: Payer: Medicare HMO | Admitting: Physical Therapy

## 2018-10-08 DIAGNOSIS — R269 Unspecified abnormalities of gait and mobility: Secondary | ICD-10-CM | POA: Diagnosis not present

## 2018-10-08 DIAGNOSIS — R293 Abnormal posture: Secondary | ICD-10-CM | POA: Diagnosis not present

## 2018-10-08 DIAGNOSIS — M6281 Muscle weakness (generalized): Secondary | ICD-10-CM | POA: Diagnosis not present

## 2018-10-08 NOTE — Therapy (Signed)
[facility] [address] [place], [name], [address] Phone: [phone]   Fax:  [phone]  Physical Therapy Treatment  Patient Details  Name: Hannah Powell MRN: [id number] Date of Birth: Nov 19, 1945 Referring Provider (PT): Dr. Ramonita Lab III   Encounter Date: 10/08/2018  PT End of Session - 10/08/18 1353    Visit Number  9    Number of Visits  16    Date for PT Re-Evaluation  11/03/18    Authorization - Visit Number  2    Authorization - Number of Visits  10    PT Start Time  5638    PT Stop Time  1336    PT Time Calculation (min)  54 min    Equipment Utilized During Treatment  Gait belt    Activity Tolerance  Patient tolerated treatment well;Patient limited by fatigue    Behavior During Therapy  [facility] for tasks assessed/performed       Past Medical History:  Diagnosis Date  . Anxiety   . Arthritis   . CHF (congestive heart failure) ([place])   . COPD (chronic obstructive pulmonary disease) ([place])   . Edema    feet/legs  . Hypertension    dr Otho Najjar     . Hypothyroidism   . Leg weakness, bilateral   . Osteoporosis   . Oxygen deficit    2l with bipap at night  . RLS (restless legs syndrome)   . Shortness of breath   . Sleep apnea    bipap  . Wheezing     Past Surgical History:  Procedure Laterality Date  . BACK SURGERY     cervical  . BREAST BIOPSY Left    needle bx-neg  . CATARACT EXTRACTION W/PHACO Left 05/07/2017   Procedure: CATARACT EXTRACTION PHACO AND INTRAOCULAR LENS PLACEMENT (IOC);  Surgeon: Birder Robson, MD;  Location: [facility];  Service: Ophthalmology;  Laterality: Left;  [place] 00:48.7 AP% 11.3 CDE 5.46 Fluid Pack Lot # [id number]  . CATARACT EXTRACTION W/PHACO Right 04/23/2017   Procedure: CATARACT EXTRACTION PHACO AND INTRAOCULAR LENS PLACEMENT ([place]);  Surgeon: Birder Robson, MD;  Location: [facility];  Service: Ophthalmology;  Laterality: Right;  [place] 00:29 AP% 12.8 CDE 3.79 FLUID PACK LOT #  [id number]  . FOOT ARTHROPLASTY    . JOINT REPLACEMENT     x2 tkr  . TOTAL SHOULDER ARTHROPLASTY Left 07/02/2013   Procedure: LEFT TOTAL SHOULDER ARTHROPLASTY;  Surgeon: Marin Shutter, MD;  Location: [place];  Service: Orthopedics;  Laterality: Left;  . TOTAL SHOULDER ARTHROPLASTY Right 04/10/2018   Procedure: TOTAL REVERSE SHOULDER ARTHROPLASTY;  Surgeon: Justice Britain, MD;  [facility]: [facility];  Service: Orthopedics;  Laterality: Right;  126mn    There were no vitals filed for this visit.  Subjective Assessment - 10/08/18 1341    Subjective  Pt reports she has started going to the pool again and worked out in the pool this morning. Pt reports feeling tired. Pt without complaints of pain.    Patient is accompained by:  Family member    Pertinent History  extensive PMH including intensive care unit stays, rehab facility stays, previously ambulated with RW.. COPD, SOB, HTN. Denies CHF, reported she has been cleared by a cardiologist.    Limitations  Walking;Lifting;Standing;Writing;House hold activities    How long can you sit comfortably?  unlimited    How long can you stand comfortably?  3-4 hours    How long can you walk comfortably?  3-4  hours    Diagnostic tests  last x-rays of left knee where in November 2016 which looked good;     Patient Stated Goals  Increase B LE muscle strength/ safety with gait    Currently in Pain?  No/denies           Therapeutic Exercise:  Nustep level 7 x 7 min Sit<>stands with UE support to stand and without UE support to sit - x8. Cuing for technique Seated marches with 5# ankle weights - x 20 Seated kicks with 5# ankle weights - x20  Seated heel raises - 2x20 Standing hip abduction in // bars with 3# ankle weights - x20 B Standing hip extension in // bars with 3# ankle weights - x20 Step ups floor>foam>6" step, unilateral UE support - x multiple reps. Cuing for technique SLS, contralateral LE supported on 6" step, with contralateral UE support - 4x30  sec B Ambulation through clinic and agility ladder x multiple reps. Cuing for increased step length and heel strike      PT Short Term Goals - 08/12/18 1236      PT SHORT TERM GOAL #1   Title  Pt. will increase AAROM with ER to 30 degrees in neutral  without increase in pain to regain independent self-care tasks.     Baseline  30 deg. ER on R.      Time  4    Period  Weeks    Status  Achieved    Target Date  06/05/18        PT Long Term Goals - 10/06/18 1513      PT LONG TERM GOAL #1   Title  Pt. will increase FOTO to 56 to improve functional mobility.    Baseline  Initial FOTO: 51; FOTO 57 10/06/2018    Time  4    Period  Weeks    Status  Achieved    Target Date  10/06/18      PT LONG TERM GOAL #2   Title  Pt. will be independent with HEP to increase B LE muscle strength 1/2 muscle grade to improve standing tolerance/ gait.    Baseline  B LE AROM: limited L ankle AROM as compared to R ankle. B LE muscle weakness: hip flexor 4/5 MMT, quads/ hamstring 4+/5 MMT, hip abduction 4+/5 MMT, hip adduction 4/5 MMT, ankle 4/5 MMT except PF 3+/5 MMT.; L/R: Hip flexion 4/5, 4+/5; Knee extension 5/5 B Knee flexion 5/5 B' DF 5/5 B 10/06/2018    Time  4    Period  Weeks    Status  Partially Met    Target Date  11/03/18      PT LONG TERM GOAL #3   Title  Pt. will demonstrate improved upright posture for >15 minutes with standing ther.ex.    Baseline  Poor standing posture/ forward head/ rounded sh; Pt maintained improved upright posture for 10 minutes before requiring cuing 10/06/2018    Time  4    Period  Weeks    Status  Partially Met    Target Date  11/03/18      PT LONG TERM GOAL #4   Title  Pt. able to ambulate outside with consistent hip flexion/ heel strike and least assistive device safely.    Baseline  Pt. ambulates with RW with limited hip flexion/ increase fall risk; Pt with improved heelstrike, but inconsistend hip flexion 10/06/2018    Time  4    Period  Weeks  Status   Partially Met    Target Date  11/03/18      PT LONG TERM GOAL #5   Title  Pt will ambulate with consistent reciprocal step pattern 100% of the time with minor cuing instead of step-to gait pattern with walker to improve mobility gait mechanics.    Baseline  Pt requires cuing ~80% of the time to increase step length.    Time  4    Period  Weeks    Status  New    Target Date  11/03/18            Plan - 10/08/18 1349    Clinical Impression Statement  Pt had difficulty with step up exercises in // bars where pt had LOB when L LE was stance leg. Pt recovered balance with UE support and min assist from SPT. Pt reported that fear of falls and that L LE gave out as contributing factors to LOB. Pt with improved heel strike and step length with ambulation exercise, but continues to require frequent cuing for posture. Pt will continue to benefit from further skilled therapy to improve posture, balance, and LE strength.    Personal Factors and Comorbidities  Age    Stability/Clinical Decision Making  Evolving/Moderate complexity    Clinical Decision Making  Moderate    Rehab Potential  Good    Clinical Impairments Affecting Rehab Potential  positive: motivation, family support, negative: co-morbidities, deconditioned, high fall risk; Body structures that will need to be addressed: weakness, numbness/tingling, impaired balance, difficulty walking, decreased transfer ability, etc; Due to co-morbidities her clinical presenstation is evolving;     PT Frequency  2x / week    PT Duration  4 weeks    PT Treatment/Interventions  Cryotherapy;Electrical Stimulation;Ultrasound;Moist Heat;Gait training;Functional mobility training;Therapeutic activities;Therapeutic exercise;Neuromuscular re-education;Scar mobilization;Balance training;Stair training;Patient/family education;Manual techniques    PT Next Visit Plan  LE strengthening ex./ progress static and dynamic balance exercises. Obstacle course next session.     PT Home Exercise Plan  See prescribed HEP.     Consulted and Agree with Plan of Care  Patient       Patient will benefit from skilled therapeutic intervention in order to improve the following deficits and impairments:  Decreased endurance, Decreased range of motion, Decreased skin integrity, Decreased strength, Hypomobility, Impaired UE functional use, Increased fascial restricitons, Pain, Postural dysfunction, Impaired flexibility, Decreased scar mobility, Decreased mobility, Decreased balance, Improper body mechanics  Visit Diagnosis: 1. Muscle weakness (generalized)   2. Gait difficulty   3. Abnormal posture        Problem List Patient Active Problem List   Diagnosis Date Noted  . S/P reverse total shoulder arthroplasty, 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  . Chronic diastolic heart failure ([place]) 08/09/2016  . COPD (chronic obstructive pulmonary disease) ([place]) 08/09/2016  . Pulmonary infiltrates   . Pneumonia due to Streptococcus pneumoniae ([place])   . Fever   . Community acquired pneumonia of right lung   . Palliative care by specialist   . Goals of care, counseling/discussion   . Pressure injury of skin 07/03/2016  . COPD exacerbation ([place]) 07/21/2015  . Acute renal insufficiency 07/21/2015  . Hyperglycemia 07/21/2015  . Benign essential HTN 07/21/2015  . S/P shoulder replacement 07/02/2013   Pura Spice, PT, DPT # [id number], SPT 10/09/2018, 7:52 AM  [facility] [address] [place], [name], [address] Phone: [phone]   Fax:  [phone]  Name: Jnaya Butrick MRN: [id number]  Date of Birth: [birth year]

## 2018-10-09 DIAGNOSIS — R200 Anesthesia of skin: Secondary | ICD-10-CM | POA: Diagnosis not present

## 2018-10-09 DIAGNOSIS — R29898 Other symptoms and signs involving the musculoskeletal system: Secondary | ICD-10-CM | POA: Diagnosis not present

## 2018-10-09 DIAGNOSIS — Z9989 Dependence on other enabling machines and devices: Secondary | ICD-10-CM | POA: Diagnosis not present

## 2018-10-09 DIAGNOSIS — G4733 Obstructive sleep apnea (adult) (pediatric): Secondary | ICD-10-CM | POA: Diagnosis not present

## 2018-10-13 ENCOUNTER — Ambulatory Visit: Payer: Medicare HMO | Admitting: Physical Therapy

## 2018-10-13 ENCOUNTER — Encounter: Payer: Self-pay | Admitting: Physical Therapy

## 2018-10-13 ENCOUNTER — Other Ambulatory Visit: Payer: Self-pay

## 2018-10-13 DIAGNOSIS — R293 Abnormal posture: Secondary | ICD-10-CM | POA: Diagnosis not present

## 2018-10-13 DIAGNOSIS — R269 Unspecified abnormalities of gait and mobility: Secondary | ICD-10-CM | POA: Diagnosis not present

## 2018-10-13 DIAGNOSIS — M6281 Muscle weakness (generalized): Secondary | ICD-10-CM

## 2018-10-13 NOTE — Therapy (Signed)
[facility] [address] [place], [name], [address] Phone: [phone]   Fax:  [phone]  Physical Therapy Treatment  Patient Details  Name: Hannah Powell MRN: [id number] Date of Birth: May 10, 1945 Referring Provider (PT): Dr. Ramonita Lab III   Encounter Date: 10/13/2018  PT End of Session - 10/13/18 1351    Visit Number  10    Number of Visits  16    Date for PT Re-Evaluation  11/03/18    Authorization - Visit Number  3    Authorization - Number of Visits  10    PT Start Time  6761    PT Stop Time  1332    PT Time Calculation (min)  46 min    Equipment Utilized During Treatment  Gait belt    Activity Tolerance  Patient tolerated treatment well;Patient limited by fatigue    Behavior During Therapy  [facility] for tasks assessed/performed       Past Medical History:  Diagnosis Date  . Anxiety   . Arthritis   . CHF (congestive heart failure) ([place])   . COPD (chronic obstructive pulmonary disease) ([place])   . Edema    feet/legs  . Hypertension    dr Otho Najjar     . Hypothyroidism   . Leg weakness, bilateral   . Osteoporosis   . Oxygen deficit    2l with bipap at night  . RLS (restless legs syndrome)   . Shortness of breath   . Sleep apnea    bipap  . Wheezing     Past Surgical History:  Procedure Laterality Date  . BACK SURGERY     cervical  . BREAST BIOPSY Left    needle bx-neg  . CATARACT EXTRACTION W/PHACO Left 05/07/2017   Procedure: CATARACT EXTRACTION PHACO AND INTRAOCULAR LENS PLACEMENT (IOC);  Surgeon: Birder Robson, MD;  Location: [facility];  Service: Ophthalmology;  Laterality: Left;  [place] 00:48.7 AP% 11.3 CDE 5.46 Fluid Pack Lot # [id number]  . CATARACT EXTRACTION W/PHACO Right 04/23/2017   Procedure: CATARACT EXTRACTION PHACO AND INTRAOCULAR LENS PLACEMENT ([place]);  Surgeon: Birder Robson, MD;  Location: [facility];  Service: Ophthalmology;  Laterality: Right;  [place] 00:29 AP% 12.8 CDE 3.79 FLUID PACK LOT  # [id number]  . FOOT ARTHROPLASTY    . JOINT REPLACEMENT     x2 tkr  . TOTAL SHOULDER ARTHROPLASTY Left 07/02/2013   Procedure: LEFT TOTAL SHOULDER ARTHROPLASTY;  Surgeon: Marin Shutter, MD;  Location: [place];  Service: Orthopedics;  Laterality: Left;  . TOTAL SHOULDER ARTHROPLASTY Right 04/10/2018   Procedure: TOTAL REVERSE SHOULDER ARTHROPLASTY;  Surgeon: Justice Britain, MD;  Location: [facility];  Service: Orthopedics;  Laterality: Right;  166mn    There were no vitals filed for this visit.  Subjective Assessment - 10/13/18 1350    Subjective  PT reports no pain today. Pt reports she went swimming Friday and that she went to the gym Saturday and did leg press and abdominal workouts.    Currently in Pain?  No/denies         Therapeutic Exercise:  Nustep level 7 x10 min Standing hip abduction in // bars with 5# ankle weights - x20 B. Cuing provided Standing hip extension in // bars with 5# ankle weights - x20 B Sit<>stands with UE support to stand and without UE support to sit - x5. Cuing for technique Seated kick outs with 5# ankle weights - 2x15 RTB standing rows 2x15. Cuing for posture.  RTB standing pull aparts 2x20  Banded walks in // bars forward/backward, laterally - x8 each. Cuing provided Ambulating through clinic to parking lot. Cues provided for posture/technique/step length       PT Education - 10/13/18 1351    Education provided  Yes    Education Details  Technique/posture with gait    Person(s) Educated  Patient    Methods  Explanation;Demonstration    Comprehension  Verbalized understanding;Returned demonstration       PT Short Term Goals - 08/12/18 1236      PT SHORT TERM GOAL #1   Title  Pt. will increase AAROM with ER to 30 degrees in neutral  without increase in pain to regain independent self-care tasks.     Baseline  30 deg. ER on R.      Time  4    Period  Weeks    Status  Achieved    Target Date  06/05/18        PT Long Term Goals - 10/06/18  1513      PT LONG TERM GOAL #1   Title  Pt. will increase FOTO to 56 to improve functional mobility.    Baseline  Initial FOTO: 51; FOTO 57 10/06/2018    Time  4    Period  Weeks    Status  Achieved    Target Date  10/06/18      PT LONG TERM GOAL #2   Title  Pt. will be independent with HEP to increase B LE muscle strength 1/2 muscle grade to improve standing tolerance/ gait.    Baseline  B LE AROM: limited L ankle AROM as compared to R ankle. B LE muscle weakness: hip flexor 4/5 MMT, quads/ hamstring 4+/5 MMT, hip abduction 4+/5 MMT, hip adduction 4/5 MMT, ankle 4/5 MMT except PF 3+/5 MMT.; L/R: Hip flexion 4/5, 4+/5; Knee extension 5/5 B Knee flexion 5/5 B' DF 5/5 B 10/06/2018    Time  4    Period  Weeks    Status  Partially Met    Target Date  11/03/18      PT LONG TERM GOAL #3   Title  Pt. will demonstrate improved upright posture for >15 minutes with standing ther.ex.    Baseline  Poor standing posture/ forward head/ rounded sh; Pt maintained improved upright posture for 10 minutes before requiring cuing 10/06/2018    Time  4    Period  Weeks    Status  Partially Met    Target Date  11/03/18      PT LONG TERM GOAL #4   Title  Pt. able to ambulate outside with consistent hip flexion/ heel strike and least assistive device safely.    Baseline  Pt. ambulates with RW with limited hip flexion/ increase fall risk; Pt with improved heelstrike, but inconsistend hip flexion 10/06/2018    Time  4    Period  Weeks    Status  Partially Met    Target Date  11/03/18      PT LONG TERM GOAL #5   Title  Pt will ambulate with consistent reciprocal step pattern 100% of the time with minor cuing instead of step-to gait pattern with walker to improve mobility gait mechanics.    Baseline  Pt requires cuing ~80% of the time to increase step length.    Time  4    Period  Weeks    Status  New    Target Date  11/03/18  Plan - 10/13/18 1355    Clinical Impression Statement  Pt with  improved eccentric control with stand<>sit today. Pt continues to require frequent cuing for posture with ambulation and standing exercises, but more consistently self-corrected today with standing hip extension/abduction. Pt technique decreased with fatigue during banded walkout exercises, indicating decreased strength of LEs. Pt also with reports of increased fatigue of L LE compared to R LE with seated and standing exercises with 5# ankle weights. Pt will continue to benefit from further physical therapy to imporve posture and LE strength.    Personal Factors and Comorbidities  Age    Stability/Clinical Decision Making  Evolving/Moderate complexity    Rehab Potential  Good    Clinical Impairments Affecting Rehab Potential  positive: motivation, family support, negative: co-morbidities, deconditioned, high fall risk; Body structures that will need to be addressed: weakness, numbness/tingling, impaired balance, difficulty walking, decreased transfer ability, etc; Due to co-morbidities her clinical presenstation is evolving;     PT Frequency  2x / week    PT Duration  4 weeks    PT Treatment/Interventions  Cryotherapy;Electrical Stimulation;Ultrasound;Moist Heat;Gait training;Functional mobility training;Therapeutic activities;Therapeutic exercise;Neuromuscular re-education;Scar mobilization;Balance training;Stair training;Patient/family education;Manual techniques    PT Next Visit Plan  LE strengthening ex./ progress static and dynamic balance exercises. Obstacle course next session.    PT Home Exercise Plan  See prescribed HEP.     Consulted and Agree with Plan of Care  Patient       Patient will benefit from skilled therapeutic intervention in order to improve the following deficits and impairments:  Decreased endurance, Decreased range of motion, Decreased skin integrity, Decreased strength, Hypomobility, Impaired UE functional use, Increased fascial restricitons, Pain, Postural dysfunction,  Impaired flexibility, Decreased scar mobility, Decreased mobility, Decreased balance, Improper body mechanics  Visit Diagnosis: 1. Muscle weakness (generalized)   2. Gait difficulty   3. Abnormal posture        Problem List Patient Active Problem List   Diagnosis Date Noted  . S/P reverse total shoulder arthroplasty, 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  . Chronic diastolic heart failure ([place]) 08/09/2016  . COPD (chronic obstructive pulmonary disease) ([place]) 08/09/2016  . Pulmonary infiltrates   . Pneumonia due to Streptococcus pneumoniae ([place])   . Fever   . Community acquired pneumonia of right lung   . Palliative care by specialist   . Goals of care, counseling/discussion   . Pressure injury of skin 07/03/2016  . COPD exacerbation ([place]) 07/21/2015  . Acute renal insufficiency 07/21/2015  . Hyperglycemia 07/21/2015  . Benign essential HTN 07/21/2015  . S/P shoulder replacement 07/02/2013   Pura Spice, PT, DPT # [id number] Bridgett Larsson 10/13/2018, 2:00 PM  [facility] [address] [place], [name], [address] Phone: [phone]   Fax:  [phone]  Name: Hannah Powell MRN: [id number] Date of Birth: 1946-01-30

## 2018-10-15 ENCOUNTER — Encounter: Payer: Self-pay | Admitting: Physical Therapy

## 2018-10-15 ENCOUNTER — Other Ambulatory Visit: Payer: Self-pay

## 2018-10-15 ENCOUNTER — Ambulatory Visit: Payer: Medicare HMO | Admitting: Physical Therapy

## 2018-10-15 DIAGNOSIS — M6281 Muscle weakness (generalized): Secondary | ICD-10-CM

## 2018-10-15 DIAGNOSIS — R269 Unspecified abnormalities of gait and mobility: Secondary | ICD-10-CM | POA: Diagnosis not present

## 2018-10-15 DIAGNOSIS — R293 Abnormal posture: Secondary | ICD-10-CM

## 2018-10-15 NOTE — Therapy (Signed)
[facility] [address] [place], [name], [address] Phone: [phone]   Fax:  [phone]  Physical Therapy Treatment  Patient Details  Name: Hannah Powell MRN: [id number] Date of Birth: [birth year] Referring Provider (PT): Dr. Ramonita Lab III   Encounter Date: 10/15/2018  PT End of Session - 10/15/18 1347    Visit Number  11    Number of Visits  16    Date for PT Re-Evaluation  11/03/18    Authorization - Visit Number  4    Authorization - Number of Visits  10    PT Start Time  6962    PT Stop Time  1332    PT Time Calculation (min)  51 min    Equipment Utilized During Treatment  Gait belt    Activity Tolerance  Patient tolerated treatment well;Patient limited by fatigue    Behavior During Therapy  [facility] for tasks assessed/performed       Past Medical History:  Diagnosis Date  . Anxiety   . Arthritis   . CHF (congestive heart failure) ([place])   . COPD (chronic obstructive pulmonary disease) ([place])   . Edema    feet/legs  . Hypertension    dr Otho Najjar     . Hypothyroidism   . Leg weakness, bilateral   . Osteoporosis   . Oxygen deficit    2l with bipap at night  . RLS (restless legs syndrome)   . Shortness of breath   . Sleep apnea    bipap  . Wheezing     Past Surgical History:  Procedure Laterality Date  . BACK SURGERY     cervical  . BREAST BIOPSY Left    needle bx-neg  . CATARACT EXTRACTION W/PHACO Left 05/07/2017   Procedure: CATARACT EXTRACTION PHACO AND INTRAOCULAR LENS PLACEMENT (IOC);  Surgeon: Birder Robson, MD;  Location: [facility];  Service: Ophthalmology;  Laterality: Left;  [place] 00:48.7 AP% 11.3 CDE 5.46 Fluid Pack Lot # [id number]  . CATARACT EXTRACTION W/PHACO Right 04/23/2017   Procedure: CATARACT EXTRACTION PHACO AND INTRAOCULAR LENS PLACEMENT ([place]);  Surgeon: Birder Robson, MD;  Location: [facility];  Service: Ophthalmology;  Laterality: Right;  [place] 00:29 AP% 12.8 CDE 3.79 FLUID PACK LOT  # [id number]  . FOOT ARTHROPLASTY    . JOINT REPLACEMENT     x2 tkr  . TOTAL SHOULDER ARTHROPLASTY Left 07/02/2013   Procedure: LEFT TOTAL SHOULDER ARTHROPLASTY;  Surgeon: Marin Shutter, MD;  Location: [place];  Service: Orthopedics;  Laterality: Left;  . TOTAL SHOULDER ARTHROPLASTY Right 04/10/2018   Procedure: TOTAL REVERSE SHOULDER ARTHROPLASTY;  Surgeon: Justice Britain, MD;  [facility];  Service: Orthopedics;  Laterality: Right;  133mn    There were no vitals filed for this visit.  Subjective Assessment - 10/15/18 1345    Subjective  Pt reports going to gym yesterday and that she did 30 minutes on the nustep at gym followed by leg extension, leg press, abdominal workouts, and shoulder exercises. No reports of pain today.    Pertinent History  extensive PMH including intensive care unit stays, rehab facility stays, previously ambulated with RW.. COPD, SOB, HTN. Denies CHF, reported she has been cleared by a cardiologist.    Limitations  Walking;Lifting;Standing;Writing;House hold activities    How long can you stand comfortably?  3-4 hours    How long can you walk comfortably?  3-4 hours    Diagnostic tests  last x-rays of left knee where in November  2016 which looked good;     Patient Stated Goals  Increase B LE muscle strength/ safety with gait    Currently in Pain?  No/denies        Therapeutic Exercise  Nustep level 7 x10 min - warm up Stand>sit x5. Focus on eccentric control Seated kicks with 4# 2x20 Seated marches with 4# 2x20 Obstacle course step over and onto foam - x 6. Cuing provided for technique Ambulation throughout clinic- x4 Cuing for posture. Standing BTB pull aparts, external rotation, and horizontal abduction - x20 each Standing hamstring curl with 4# ankle weights x 20 B Standing hip abduction with 4# ankle weight x 20        PT Education - 10/15/18 1347    Education Details  Pt educated on technique with gait/navigating obstacles for safety     Person(s) Educated  Patient    Methods  Explanation;Demonstration    Comprehension  Verbalized understanding;Returned demonstration       PT Short Term Goals - 08/12/18 1236      PT SHORT TERM GOAL #1   Title  Pt. will increase AAROM with ER to 30 degrees in neutral  without increase in pain to regain independent self-care tasks.     Baseline  30 deg. ER on R.      Time  4    Period  Weeks    Status  Achieved    Target Date  06/05/18        PT Long Term Goals - 10/06/18 1513      PT LONG TERM GOAL #1   Title  Pt. will increase FOTO to 56 to improve functional mobility.    Baseline  Initial FOTO: 51; FOTO 57 10/06/2018    Time  4    Period  Weeks    Status  Achieved    Target Date  10/06/18      PT LONG TERM GOAL #2   Title  Pt. will be independent with HEP to increase B LE muscle strength 1/2 muscle grade to improve standing tolerance/ gait.    Baseline  B LE AROM: limited L ankle AROM as compared to R ankle. B LE muscle weakness: hip flexor 4/5 MMT, quads/ hamstring 4+/5 MMT, hip abduction 4+/5 MMT, hip adduction 4/5 MMT, ankle 4/5 MMT except PF 3+/5 MMT.; L/R: Hip flexion 4/5, 4+/5; Knee extension 5/5 B Knee flexion 5/5 B' DF 5/5 B 10/06/2018    Time  4    Period  Weeks    Status  Partially Met    Target Date  11/03/18      PT LONG TERM GOAL #3   Title  Pt. will demonstrate improved upright posture for >15 minutes with standing ther.ex.    Baseline  Poor standing posture/ forward head/ rounded sh; Pt maintained improved upright posture for 10 minutes before requiring cuing 10/06/2018    Time  4    Period  Weeks    Status  Partially Met    Target Date  11/03/18      PT LONG TERM GOAL #4   Title  Pt. able to ambulate outside with consistent hip flexion/ heel strike and least assistive device safely.    Baseline  Pt. ambulates with RW with limited hip flexion/ increase fall risk; Pt with improved heelstrike, but inconsistend hip flexion 10/06/2018    Time  4    Period   Weeks    Status  Partially Met    Target Date  11/03/18      PT LONG TERM GOAL #5   Title  Pt will ambulate with consistent reciprocal step pattern 100% of the time with minor cuing instead of step-to gait pattern with walker to improve mobility gait mechanics.    Baseline  Pt requires cuing ~80% of the time to increase step length.    Time  4    Period  Weeks    Status  New    Target Date  11/03/18            Plan - 10/15/18 1348    Clinical Impression Statement  Pt demonstrates improvement in therapy today with reports of increased tolerance for strength training exercises. Pt required cues for upright posture with gait and all standing exercises. Pt reports habit of looking at feet with gait due to fear of falls. Pt also with significant decrease in gait speed when approaching 3" step d/t FOF during obstacle course exercise. Pt also required frequent cuing to increase step length to clear obstacle. Pt will continue to benefit from further skilled therapy to improve posture and safety with gait over uneven surfaces.    Personal Factors and Comorbidities  Age    Stability/Clinical Decision Making  Evolving/Moderate complexity    Clinical Decision Making  Moderate    Rehab Potential  Good    Clinical Impairments Affecting Rehab Potential  positive: motivation, family support, negative: co-morbidities, deconditioned, high fall risk; Body structures that will need to be addressed: weakness, numbness/tingling, impaired balance, difficulty walking, decreased transfer ability, etc; Due to co-morbidities her clinical presenstation is evolving;     PT Frequency  2x / week    PT Duration  4 weeks    PT Treatment/Interventions  Cryotherapy;Electrical Stimulation;Ultrasound;Moist Heat;Gait training;Functional mobility training;Therapeutic activities;Therapeutic exercise;Neuromuscular re-education;Scar mobilization;Balance training;Stair training;Patient/family education;Manual techniques    PT  Next Visit Plan  LE strengthening ex./ progress static and dynamic balance exercises. Progress postural/shoulder exercises.    PT Home Exercise Plan  See prescribed HEP.     Consulted and Agree with Plan of Care  Patient       Patient will benefit from skilled therapeutic intervention in order to improve the following deficits and impairments:  Decreased endurance, Decreased range of motion, Decreased skin integrity, Decreased strength, Hypomobility, Impaired UE functional use, Increased fascial restricitons, Pain, Postural dysfunction, Impaired flexibility, Decreased scar mobility, Decreased mobility, Decreased balance, Improper body mechanics  Visit Diagnosis: 1. Muscle weakness (generalized)   2. Gait difficulty   3. Abnormal posture        Problem List Patient Active Problem List   Diagnosis Date Noted  . S/P reverse total shoulder arthroplasty, 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  . Chronic diastolic heart failure ([place]) 08/09/2016  . COPD (chronic obstructive pulmonary disease) ([place]) 08/09/2016  . Pulmonary infiltrates   . Pneumonia due to Streptococcus pneumoniae ([place])   . Fever   . Community acquired pneumonia of right lung   . Palliative care by specialist   . Goals of care, counseling/discussion   . Pressure injury of skin 07/03/2016  . COPD exacerbation ([place]) 07/21/2015  . Acute renal insufficiency 07/21/2015  . Hyperglycemia 07/21/2015  . Benign essential HTN 07/21/2015  . S/P shoulder replacement 07/02/2013   Pura Spice, PT, DPT # [id number], SPT 10/15/2018, 2:01 PM  [facility] [address] [place], [name], [address] Phone: [phone]   Fax:  [phone]  Name: Hannah Powell MRN: [id number] Date of Birth: 1945/08/17

## 2018-10-20 ENCOUNTER — Ambulatory Visit: Payer: Medicare HMO | Admitting: Physical Therapy

## 2018-10-22 ENCOUNTER — Encounter: Payer: Self-pay | Admitting: Physical Therapy

## 2018-10-22 ENCOUNTER — Ambulatory Visit: Payer: Medicare HMO | Attending: Internal Medicine | Admitting: Physical Therapy

## 2018-10-22 ENCOUNTER — Other Ambulatory Visit: Payer: Self-pay

## 2018-10-22 DIAGNOSIS — M6281 Muscle weakness (generalized): Secondary | ICD-10-CM | POA: Diagnosis not present

## 2018-10-22 DIAGNOSIS — R269 Unspecified abnormalities of gait and mobility: Secondary | ICD-10-CM | POA: Diagnosis not present

## 2018-10-22 DIAGNOSIS — R293 Abnormal posture: Secondary | ICD-10-CM | POA: Diagnosis not present

## 2018-10-22 NOTE — Therapy (Signed)
[facility] [address] [place], [name], [address] Phone: [phone]   Fax:  [phone]  Physical Therapy Treatment  Patient Details  Name: [place] MRN: [id number] Date of Birth: [birth year] Referring Provider (PT): Dr. Ramonita Lab III   Encounter Date: 10/22/2018  PT End of Session - 10/22/18 1344    Visit Number  12    Number of Visits  16    Date for PT Re-Evaluation  11/03/18    Authorization - Visit Number  5    Authorization - Number of Visits  10    PT Start Time  1238    PT Stop Time  1329    PT Time Calculation (min)  51 min    Activity Tolerance  Patient tolerated treatment well;Patient limited by fatigue    Behavior During Therapy  [facility] for tasks assessed/performed       Past Medical History:  Diagnosis Date  . Anxiety   . Arthritis   . CHF (congestive heart failure) ([place])   . COPD (chronic obstructive pulmonary disease) ([place])   . Edema    feet/legs  . Hypertension    dr Otho Najjar     . Hypothyroidism   . Leg weakness, bilateral   . Osteoporosis   . Oxygen deficit    2l with bipap at night  . RLS (restless legs syndrome)   . Shortness of breath   . Sleep apnea    bipap  . Wheezing     Past Surgical History:  Procedure Laterality Date  . BACK SURGERY     cervical  . BREAST BIOPSY Left    needle bx-neg  . CATARACT EXTRACTION W/PHACO Left 05/07/2017   Procedure: CATARACT EXTRACTION PHACO AND INTRAOCULAR LENS PLACEMENT (IOC);  Surgeon: Birder Robson, MD;  Location: [facility];  Service: Ophthalmology;  Laterality: Left;  [place] 00:48.7 AP% 11.3 CDE 5.46 Fluid Pack Lot # [id number]  . CATARACT EXTRACTION W/PHACO Right 04/23/2017   Procedure: CATARACT EXTRACTION PHACO AND INTRAOCULAR LENS PLACEMENT ([place]);  Surgeon: Birder Robson, MD;  Location: [facility];  Service: Ophthalmology;  Laterality: Right;  [place] 00:29 AP% 12.8 CDE 3.79 FLUID PACK LOT # [id number]  . FOOT ARTHROPLASTY    . JOINT  REPLACEMENT     x2 tkr  . TOTAL SHOULDER ARTHROPLASTY Left 07/02/2013   Procedure: LEFT TOTAL SHOULDER ARTHROPLASTY;  Surgeon: Marin Shutter, MD;  Location: [place];  Service: Orthopedics;  Laterality: Left;  . TOTAL SHOULDER ARTHROPLASTY Right 04/10/2018   Procedure: TOTAL REVERSE SHOULDER ARTHROPLASTY;  Surgeon: Justice Britain, MD;  Location: [facility];  Service: Orthopedics;  Laterality: Right;  126mn    There were no vitals filed for this visit.  Subjective Assessment - 10/22/18 1342    Subjective  Pt reports she feels really fatigued today. Pt reports she is ready for house remodeling to be done and that is in part why she feels tired. Pt reports no pain today.  Pt reports she has not gone to the gym recently due to fatigue.    Pertinent History  extensive PMH including intensive care unit stays, rehab facility stays, previously ambulated with RW.. COPD, SOB, HTN. Denies CHF, reported she has been cleared by a cardiologist.    Limitations  Walking;Lifting;Standing;Writing;House hold activities    How long can you stand comfortably?  3-4 hours    How long can you walk comfortably?  3-4 hours    Diagnostic tests  last x-rays of left knee  where in November 2016 which looked good;     Patient Stated Goals  Increase B LE muscle strength/ safety with gait    Currently in Pain?  No/denies           Therapeutic Exercise-  NuStep level 6 x10 min - warm up Seated kicks with 4# ankle weights - 2x20 Seated marches with 4# ankle weights -2x20 with decreased ROM Seated adductor squeeze - 2x20 with 3 sec hold Seated hamstring curl RTB 2x20  Pt ambulated around clinic with RW and to car in parking lot. Cuing provided for posture/increased step length.  Reviewed HEP/ discussed aquatic ex.     PT Education - 10/22/18 1343    Education Details  Pt educated on energy conservation.    Person(s) Educated  Patient    Methods  Explanation    Comprehension  Verbalized understanding       PT Short  Term Goals - 08/12/18 1236      PT SHORT TERM GOAL #1   Title  Pt. will increase AAROM with ER to 30 degrees in neutral  without increase in pain to regain independent self-care tasks.     Baseline  30 deg. ER on R.      Time  4    Period  Weeks    Status  Achieved    Target Date  06/05/18        PT Long Term Goals - 10/06/18 1513      PT LONG TERM GOAL #1   Title  Pt. will increase FOTO to 56 to improve functional mobility.    Baseline  Initial FOTO: 51; FOTO 57 10/06/2018    Time  4    Period  Weeks    Status  Achieved    Target Date  10/06/18      PT LONG TERM GOAL #2   Title  Pt. will be independent with HEP to increase B LE muscle strength 1/2 muscle grade to improve standing tolerance/ gait.    Baseline  B LE AROM: limited L ankle AROM as compared to R ankle. B LE muscle weakness: hip flexor 4/5 MMT, quads/ hamstring 4+/5 MMT, hip abduction 4+/5 MMT, hip adduction 4/5 MMT, ankle 4/5 MMT except PF 3+/5 MMT.; L/R: Hip flexion 4/5, 4+/5; Knee extension 5/5 B Knee flexion 5/5 B' DF 5/5 B 10/06/2018    Time  4    Period  Weeks    Status  Partially Met    Target Date  11/03/18      PT LONG TERM GOAL #3   Title  Pt. will demonstrate improved upright posture for >15 minutes with standing ther.ex.    Baseline  Poor standing posture/ forward head/ rounded sh; Pt maintained improved upright posture for 10 minutes before requiring cuing 10/06/2018    Time  4    Period  Weeks    Status  Partially Met    Target Date  11/03/18      PT LONG TERM GOAL #4   Title  Pt. able to ambulate outside with consistent hip flexion/ heel strike and least assistive device safely.    Baseline  Pt. ambulates with RW with limited hip flexion/ increase fall risk; Pt with improved heelstrike, but inconsistend hip flexion 10/06/2018    Time  4    Period  Weeks    Status  Partially Met    Target Date  11/03/18      PT LONG TERM GOAL #5  Title  Pt will ambulate with consistent reciprocal step pattern  100% of the time with minor cuing instead of step-to gait pattern with walker to improve mobility gait mechanics.    Baseline  Pt requires cuing ~80% of the time to increase step length.    Time  4    Period  Weeks    Status  New    Target Date  11/03/18            Plan - 10/22/18 1344    Clinical Impression Statement  Pt limited with exercises in therapy secondary to reports of increased fatigue. Pt demonstrates decreased AROM with seated marches, indicating decreased strength of the B hip flexors. Pt continues to require cuing to correct posture with gait and to increase step length. Pt will benefit from further skilled therapy to increase LE strength and improve gait mechanics.    Personal Factors and Comorbidities  Age    Stability/Clinical Decision Making  Evolving/Moderate complexity    Clinical Decision Making  Moderate    Rehab Potential  Good    Clinical Impairments Affecting Rehab Potential  positive: motivation, family support, negative: co-morbidities, deconditioned, high fall risk; Body structures that will need to be addressed: weakness, numbness/tingling, impaired balance, difficulty walking, decreased transfer ability, etc; Due to co-morbidities her clinical presenstation is evolving;     PT Frequency  2x / week    PT Duration  4 weeks    PT Treatment/Interventions  Cryotherapy;Electrical Stimulation;Ultrasound;Moist Heat;Gait training;Functional mobility training;Therapeutic activities;Therapeutic exercise;Neuromuscular re-education;Scar mobilization;Balance training;Stair training;Patient/family education;Manual techniques    PT Next Visit Plan  LE strengthening ex./ progress static and dynamic balance exercises. Issue new HEP    PT Home Exercise Plan  See prescribed HEP.     Consulted and Agree with Plan of Care  Patient       Patient will benefit from skilled therapeutic intervention in order to improve the following deficits and impairments:  Decreased endurance,  Decreased range of motion, Decreased skin integrity, Decreased strength, Hypomobility, Impaired UE functional use, Increased fascial restricitons, Pain, Postural dysfunction, Impaired flexibility, Decreased scar mobility, Decreased mobility, Decreased balance, Improper body mechanics  Visit Diagnosis: 1. Muscle weakness (generalized)   2. Gait difficulty   3. Abnormal posture        Problem List Patient Active Problem List   Diagnosis Date Noted  . S/P reverse total shoulder arthroplasty, 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  . Chronic diastolic heart failure ([place]) 08/09/2016  . COPD (chronic obstructive pulmonary disease) ([place]) 08/09/2016  . Pulmonary infiltrates   . Pneumonia due to Streptococcus pneumoniae ([place])   . Fever   . Community acquired pneumonia of right lung   . Palliative care by specialist   . Goals of care, counseling/discussion   . Pressure injury of skin 07/03/2016  . COPD exacerbation ([place]) 07/21/2015  . Acute renal insufficiency 07/21/2015  . Hyperglycemia 07/21/2015  . Benign essential HTN 07/21/2015  . S/P shoulder replacement 07/02/2013   Pura Spice, PT, DPT # [place], SPT 10/23/2018, 7:25 AM  [facility] [address] [place], [name], [address] Phone: [phone]   Fax:  [phone]  Name: Suhayla Chisom MRN: [id number] Date of Birth: Nov 18, 1945

## 2018-10-24 ENCOUNTER — Encounter: Payer: Self-pay | Admitting: Physical Therapy

## 2018-10-24 ENCOUNTER — Ambulatory Visit: Payer: Medicare HMO | Admitting: Physical Therapy

## 2018-10-24 ENCOUNTER — Other Ambulatory Visit: Payer: Self-pay

## 2018-10-24 DIAGNOSIS — R293 Abnormal posture: Secondary | ICD-10-CM | POA: Diagnosis not present

## 2018-10-24 DIAGNOSIS — M6281 Muscle weakness (generalized): Secondary | ICD-10-CM | POA: Diagnosis not present

## 2018-10-24 DIAGNOSIS — R269 Unspecified abnormalities of gait and mobility: Secondary | ICD-10-CM

## 2018-10-24 NOTE — Therapy (Signed)
[place] [facility] [address] [place], [name], [address] Phone: [phone]   Fax:  [phone]  Physical Therapy Treatment  Patient Details  Name: Hannah [place] MRN: [id number] Date of Birth: [birth year] Referring Provider (PT): Dr. Ramonita Lab III   Encounter Date: 10/24/2018  PT End of Session - 10/24/18 1155    Visit Number  13    Number of Visits  16    Date for PT Re-Evaluation  11/03/18    Authorization - Visit Number  6    Authorization - Number of Visits  10    PT Start Time  3154    PT Stop Time  1138    PT Time Calculation (min)  56 min    Equipment Utilized During Treatment  Gait belt    Activity Tolerance  Patient tolerated treatment well;Patient limited by fatigue    Behavior During Therapy  [facility] [name] [facility] for tasks assessed/performed       Past Medical History:  Diagnosis Date  . Anxiety   . Arthritis   . CHF (congestive heart failure) ([place])   . COPD (chronic obstructive pulmonary disease) ([place])   . Edema    feet/legs  . Hypertension    dr Otho Najjar     . Hypothyroidism   . Leg weakness, bilateral   . Osteoporosis   . Oxygen deficit    2l with bipap at night  . RLS (restless legs syndrome)   . Shortness of breath   . Sleep apnea    bipap  . Wheezing     Past Surgical History:  Procedure Laterality Date  . BACK SURGERY     cervical  . BREAST BIOPSY Left    needle bx-neg  . CATARACT EXTRACTION W/PHACO Left 05/07/2017   Procedure: CATARACT EXTRACTION PHACO AND INTRAOCULAR LENS PLACEMENT (IOC);  Surgeon: Birder Robson, MD;  Location: [facility];  Service: Ophthalmology;  Laterality: Left;  [place] 00:48.7 AP% 11.3 CDE 5.46 Fluid Pack Lot # [id number]  . CATARACT EXTRACTION W/PHACO Right 04/23/2017   Procedure: CATARACT EXTRACTION PHACO AND INTRAOCULAR LENS PLACEMENT ([place]);  Surgeon: Birder Robson, MD;  Location: [facility];  Service: Ophthalmology;  Laterality: Right;  [place] 00:29 AP% 12.8 CDE 3.79 FLUID PACK LOT #  [id number] H  . FOOT ARTHROPLASTY    . JOINT REPLACEMENT     x2 tkr  . TOTAL SHOULDER ARTHROPLASTY Left 07/02/2013   Procedure: LEFT TOTAL SHOULDER ARTHROPLASTY;  Surgeon: Marin Shutter, MD;  Location: [place];  Service: Orthopedics;  Laterality: Left;  . TOTAL SHOULDER ARTHROPLASTY Right 04/10/2018   Procedure: TOTAL REVERSE SHOULDER ARTHROPLASTY;  Surgeon: Justice Britain, MD;  [facility];  Service: Orthopedics;  Laterality: Right;  191mn    There were no vitals filed for this visit.  Subjective Assessment - 10/24/18 1200    Subjective  Pt reports continued fatigue today but states fatigue "not as bad" as previous session. Pt reports she went to gym yesterday and exercised on the stationary bike, and performed ab and LE exercises. Pt does not report pain today.    Pertinent History  extensive PMH including intensive care unit stays, rehab facility stays, previously ambulated with RW.. COPD, SOB, HTN. Denies CHF, reported she has been cleared by a cardiologist.    Limitations  Walking;Lifting;Standing;Writing;House hold activities    How long can you stand comfortably?  3-4 hours    How long can you walk comfortably?  3-4 hours    Diagnostic tests  last x-rays  of left knee where in November 2016 which looked good;     Patient Stated Goals  Increase B LE muscle strength/ safety with gait    Currently in Pain?  No/denies         Therapeutic Exercise: Walking marches forward/backward in // bars with UE support, 4# ankle weights, 2x6 each. Cuing provided for upright posture.  Lateral stepping in // bars with UE support, 4# ankle weights, 1x8 Seated LAQs, heel raises, toe raises with 4# ankle weights 20x each Pt seated adductor squeeze- 2x10 with 5 second isometrics, mod effort Staggered sit<>stands with focus on eccentric component 1x6 with UE assist RTB seated woodchoppers - 2x20. Cuing provided. Standing hamstring curls with 4# ankle weights 1x20 B. Cuing provided for posture/technique.   Nustep L6, 10 min. Cuing provided for technique.    PT Education - 10/24/18 1205    Education Details  Pt educted on technique with seated core exericse    Person(s) Educated  Patient    Methods  Explanation;Demonstration    Comprehension  Verbalized understanding;Returned demonstration       PT Short Term Goals - 08/12/18 1236      PT SHORT TERM GOAL #1   Title  Pt. will increase AAROM with ER to 30 degrees in neutral  without increase in pain to regain independent self-care tasks.     Baseline  30 deg. ER on R.      Time  4    Period  Weeks    Status  Achieved    Target Date  06/05/18        PT Long Term Goals - 10/06/18 1513      PT LONG TERM GOAL #1   Title  Pt. will increase FOTO to 56 to improve functional mobility.    Baseline  Initial FOTO: 51; FOTO 57 10/06/2018    Time  4    Period  Weeks    Status  Achieved    Target Date  10/06/18      PT LONG TERM GOAL #2   Title  Pt. will be independent with HEP to increase B LE muscle strength 1/2 muscle grade to improve standing tolerance/ gait.    Baseline  B LE AROM: limited L ankle AROM as compared to R ankle. B LE muscle weakness: hip flexor 4/5 MMT, quads/ hamstring 4+/5 MMT, hip abduction 4+/5 MMT, hip adduction 4/5 MMT, ankle 4/5 MMT except PF 3+/5 MMT.; L/R: Hip flexion 4/5, 4+/5; Knee extension 5/5 B Knee flexion 5/5 B' DF 5/5 B 10/06/2018    Time  4    Period  Weeks    Status  Partially Met    Target Date  11/03/18      PT LONG TERM GOAL #3   Title  Pt. will demonstrate improved upright posture for >15 minutes with standing ther.ex.    Baseline  Poor standing posture/ forward head/ rounded sh; Pt maintained improved upright posture for 10 minutes before requiring cuing 10/06/2018    Time  4    Period  Weeks    Status  Partially Met    Target Date  11/03/18      PT LONG TERM GOAL #4   Title  Pt. able to ambulate outside with consistent hip flexion/ heel strike and least assistive device safely.    Baseline   Pt. ambulates with RW with limited hip flexion/ increase fall risk; Pt with improved heelstrike, but inconsistend hip flexion 10/06/2018    Time  4    Period  Weeks    Status  Partially Met    Target Date  11/03/18      PT LONG TERM GOAL #5   Title  Pt will ambulate with consistent reciprocal step pattern 100% of the time with minor cuing instead of step-to gait pattern with walker to improve mobility gait mechanics.    Baseline  Pt requires cuing ~80% of the time to increase step length.    Time  4    Period  Weeks    Status  New    Target Date  11/03/18            Plan - 10/24/18 1157    Clinical Impression Statement  Pt with difficulty performing staggered sit<>stand execises without UE support and mod-max effort today. Pt reported greater ease with staggered sit<>stands with L LE as stance leg compared to R LE.  Pt moderately limited with standing exercises today secondary to continued reports of fatigue. Pt will continue to benefit from further skilled therapy to increase B LE strength.    Personal Factors and Comorbidities  Age    Stability/Clinical Decision Making  Evolving/Moderate complexity    Clinical Decision Making  Moderate    Rehab Potential  Good    Clinical Impairments Affecting Rehab Potential  positive: motivation, family support, negative: co-morbidities, deconditioned, high fall risk; Body structures that will need to be addressed: weakness, numbness/tingling, impaired balance, difficulty walking, decreased transfer ability, etc; Due to co-morbidities her clinical presenstation is evolving;     PT Frequency  2x / week    PT Duration  4 weeks    PT Treatment/Interventions  Cryotherapy;Electrical Stimulation;Ultrasound;Moist Heat;Gait training;Functional mobility training;Therapeutic activities;Therapeutic exercise;Neuromuscular re-education;Scar mobilization;Balance training;Stair training;Patient/family education;Manual techniques    PT Next Visit Plan  LE  strengthening ex./ progress static and dynamic balance exercises. Progress LE strength exercises: staggered sit<>stands with unilateral UE support.    PT Home Exercise Plan  See prescribed HEP.     Consulted and Agree with Plan of Care  Patient       Patient will benefit from skilled therapeutic intervention in order to improve the following deficits and impairments:  Decreased endurance, Decreased range of motion, Decreased skin integrity, Decreased strength, Hypomobility, Impaired UE functional use, Increased fascial restricitons, Pain, Postural dysfunction, Impaired flexibility, Decreased scar mobility, Decreased mobility, Decreased balance, Improper body mechanics  Visit Diagnosis: 1. Muscle weakness (generalized)   2. Gait difficulty   3. Abnormal posture        Problem List Patient Active Problem List   Diagnosis Date Noted  . S/P reverse total shoulder arthroplasty, 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  . Chronic diastolic heart failure ([place]) 08/09/2016  . COPD (chronic obstructive pulmonary disease) ([place]) 08/09/2016  . Pulmonary infiltrates   . Pneumonia due to Streptococcus pneumoniae ([place])   . Fever   . Community acquired pneumonia of right lung   . Palliative care by specialist   . Goals of care, counseling/discussion   . Pressure injury of skin 07/03/2016  . COPD exacerbation ([place]) 07/21/2015  . Acute renal insufficiency 07/21/2015  . Hyperglycemia 07/21/2015  . Benign essential HTN 07/21/2015  . S/P shoulder replacement 07/02/2013   Pura Spice, PT, DPT # [place], SPT 10/24/2018, 6:50 PM  [place] [facility] [address] [place], [name], [address] Phone: [phone]   Fax:  [phone]  Name: [place] MRN: [id number] Date of Birth: November 22, 1945

## 2018-10-27 ENCOUNTER — Ambulatory Visit: Payer: Medicare HMO | Admitting: Physical Therapy

## 2018-10-27 ENCOUNTER — Encounter: Payer: Self-pay | Admitting: Physical Therapy

## 2018-10-27 ENCOUNTER — Other Ambulatory Visit: Payer: Self-pay

## 2018-10-27 DIAGNOSIS — R269 Unspecified abnormalities of gait and mobility: Secondary | ICD-10-CM | POA: Diagnosis not present

## 2018-10-27 DIAGNOSIS — R293 Abnormal posture: Secondary | ICD-10-CM

## 2018-10-27 DIAGNOSIS — M6281 Muscle weakness (generalized): Secondary | ICD-10-CM

## 2018-10-27 NOTE — Therapy (Signed)
[facility] [address] [place], [name], [address] Phone: [phone]   Fax:  [phone]  Physical Therapy Treatment  Patient Details  Name: Hannah Powell MRN: [id number] Date of Birth: October 29, 1945 Referring Provider (PT): Dr. Ramonita Lab III   Encounter Date: 10/27/2018  PT End of Session - 10/27/18 1339    Visit Number  14    Number of Visits  16    Date for PT Re-Evaluation  11/03/18    Authorization - Visit Number  7    Authorization - Number of Visits  10    PT Start Time  1324    PT Stop Time  1112    PT Time Calculation (min)  57 min    Equipment Utilized During Treatment  Gait belt    Activity Tolerance  Patient tolerated treatment well;Patient limited by fatigue    Behavior During Therapy  [facility] for tasks assessed/performed       Past Medical History:  Diagnosis Date  . Anxiety   . Arthritis   . CHF (congestive heart failure) ([place])   . COPD (chronic obstructive pulmonary disease) ([place])   . Edema    feet/legs  . Hypertension    dr Otho Najjar     . Hypothyroidism   . Leg weakness, bilateral   . Osteoporosis   . Oxygen deficit    2l with bipap at night  . RLS (restless legs syndrome)   . Shortness of breath   . Sleep apnea    bipap  . Wheezing     Past Surgical History:  Procedure Laterality Date  . BACK SURGERY     cervical  . BREAST BIOPSY Left    needle bx-neg  . CATARACT EXTRACTION W/PHACO Left 05/07/2017   Procedure: CATARACT EXTRACTION PHACO AND INTRAOCULAR LENS PLACEMENT (IOC);  Surgeon: Birder Robson, MD;  Location: [facility];  Service: Ophthalmology;  Laterality: Left;  [place] 00:48.7 AP% 11.3 CDE 5.46 Fluid Pack Lot # [id number]  . CATARACT EXTRACTION W/PHACO Right 04/23/2017   Procedure: CATARACT EXTRACTION PHACO AND INTRAOCULAR LENS PLACEMENT ([place]);  Surgeon: Birder Robson, MD;  Location: [facility];  Service: Ophthalmology;  Laterality: Right;  [place] 00:29 AP% 12.8 CDE 3.79 FLUID PACK LOT  # [id number]  . FOOT ARTHROPLASTY    . JOINT REPLACEMENT     x2 tkr  . TOTAL SHOULDER ARTHROPLASTY Left 07/02/2013   Procedure: LEFT TOTAL SHOULDER ARTHROPLASTY;  Surgeon: Marin Shutter, MD;  Location: [place];  Service: Orthopedics;  Laterality: Left;  . TOTAL SHOULDER ARTHROPLASTY Right 04/10/2018   Procedure: TOTAL REVERSE SHOULDER ARTHROPLASTY;  Surgeon: Justice Britain, MD;  Location: [facility];  Service: Orthopedics;  Laterality: Right;  113mn    There were no vitals filed for this visit.  Subjective Assessment - 10/27/18 1330    Subjective  Pt. entered PT with use of RW and no new complaints.  PT discussed use of SPC for short distance walking in PT clinic and pt. states her fear of falling.  Pt. states she is always really careful with all aspects of walking/ transfers.    Pertinent History  extensive PMH including intensive care unit stays, rehab facility stays, previously ambulated with RW.. COPD, SOB, HTN. Denies CHF, reported she has been cleared by a cardiologist.    Limitations  Walking;Lifting;Standing;Writing;House hold activities    How long can you stand comfortably?  3-4 hours    How long can you walk comfortably?  3-4 hours  Diagnostic tests  last x-rays of left knee where in November 2016 which looked good;     Patient Stated Goals  Increase B LE muscle strength/ safety with gait    Currently in Pain?  No/denies         Therapeutic Exercise:  Nustep L6 10 min. B UE/LE (warm-up) Seated: marching/ LAQ/ step touches/ heel and toe raises 20x Standing hip abduction 30x/ hamstring curls 20x/ wt. Shifting with light UE assist at stairs Sit to stands 6x  Neuro:  Standing step touches in UE assist required.  Pt. Unable to attempt with no UE assist Walking in PT clinic with use of SPC on L and 2-point gait pattern (Min. To Mod. A for safety). Lateral walking in agility ladder with no UE assist (cuing to increase BOS/ step pattern/ upright posture) Car transfers/ managing RW  in back seat (pt. Very careful/ fearful of falling).       PT Short Term Goals - 08/12/18 1236      PT SHORT TERM GOAL #1   Title  Pt. will increase AAROM with ER to 30 degrees in neutral  without increase in pain to regain independent self-care tasks.     Baseline  30 deg. ER on R.      Time  4    Period  Weeks    Status  Achieved    Target Date  06/05/18        PT Long Term Goals - 10/06/18 1513      PT LONG TERM GOAL #1   Title  Pt. will increase FOTO to 56 to improve functional mobility.    Baseline  Initial FOTO: 51; FOTO 57 10/06/2018    Time  4    Period  Weeks    Status  Achieved    Target Date  10/06/18      PT LONG TERM GOAL #2   Title  Pt. will be independent with HEP to increase B LE muscle strength 1/2 muscle grade to improve standing tolerance/ gait.    Baseline  B LE AROM: limited L ankle AROM as compared to R ankle. B LE muscle weakness: hip flexor 4/5 MMT, quads/ hamstring 4+/5 MMT, hip abduction 4+/5 MMT, hip adduction 4/5 MMT, ankle 4/5 MMT except PF 3+/5 MMT.; L/R: Hip flexion 4/5, 4+/5; Knee extension 5/5 B Knee flexion 5/5 B' DF 5/5 B 10/06/2018    Time  4    Period  Weeks    Status  Partially Met    Target Date  11/03/18      PT LONG TERM GOAL #3   Title  Pt. will demonstrate improved upright posture for >15 minutes with standing ther.ex.    Baseline  Poor standing posture/ forward head/ rounded sh; Pt maintained improved upright posture for 10 minutes before requiring cuing 10/06/2018    Time  4    Period  Weeks    Status  Partially Met    Target Date  11/03/18      PT LONG TERM GOAL #4   Title  Pt. able to ambulate outside with consistent hip flexion/ heel strike and least assistive device safely.    Baseline  Pt. ambulates with RW with limited hip flexion/ increase fall risk; Pt with improved heelstrike, but inconsistend hip flexion 10/06/2018    Time  4    Period  Weeks    Status  Partially Met    Target Date  11/03/18      PT  LONG TERM GOAL  #5   Title  Pt will ambulate with consistent reciprocal step pattern 100% of the time with minor cuing instead of step-to gait pattern with walker to improve mobility gait mechanics.    Baseline  Pt requires cuing ~80% of the time to increase step length.    Time  4    Period  Weeks    Status  New    Target Date  11/03/18            Plan - 10/27/18 1340    Clinical Impression Statement  PT focused on balance tasks with SPC or no UE assist but mod. A from PT for safety/ cuing.  Pt. very fearful of falling when walking in PT clinic with use of SPC on L (2-point gait) and agility ladder tasks.  Pt. unable to safely complete 6" step touches without UE assist at stairs or //-bars.  Cuing t/o tx. to increase hip flexion/ step length, esp. on L LE due to muscle weakness.  Pt. fatigued after tx. session/ walk to car and able to safely put RW in back seat/ transfer into drivers seat.    Personal Factors and Comorbidities  Age    Stability/Clinical Decision Making  Evolving/Moderate complexity    Clinical Decision Making  Moderate    Rehab Potential  Good    Clinical Impairments Affecting Rehab Potential  positive: motivation, family support, negative: co-morbidities, deconditioned, high fall risk; Body structures that will need to be addressed: weakness, numbness/tingling, impaired balance, difficulty walking, decreased transfer ability, etc; Due to co-morbidities her clinical presenstation is evolving;     PT Frequency  2x / week    PT Duration  4 weeks    PT Treatment/Interventions  Cryotherapy;Electrical Stimulation;Ultrasound;Moist Heat;Gait training;Functional mobility training;Therapeutic activities;Therapeutic exercise;Neuromuscular re-education;Scar mobilization;Balance training;Stair training;Patient/family education;Manual techniques    PT Next Visit Plan  LE strengthening ex./ progress static and dynamic balance exercises. Progress LE strength exercises: staggered sit<>stands with  unilateral UE support.    PT Home Exercise Plan  See prescribed HEP.     Consulted and Agree with Plan of Care  Patient       Patient will benefit from skilled therapeutic intervention in order to improve the following deficits and impairments:  Decreased endurance, Decreased range of motion, Decreased skin integrity, Decreased strength, Hypomobility, Impaired UE functional use, Increased fascial restricitons, Pain, Postural dysfunction, Impaired flexibility, Decreased scar mobility, Decreased mobility, Decreased balance, Improper body mechanics  Visit Diagnosis: 1. Muscle weakness (generalized)   2. Gait difficulty   3. Abnormal posture        Problem List Patient Active Problem List   Diagnosis Date Noted  . S/P reverse total shoulder arthroplasty, 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  . Chronic diastolic heart failure ([place]) 08/09/2016  . COPD (chronic obstructive pulmonary disease) ([place]) 08/09/2016  . Pulmonary infiltrates   . Pneumonia due to Streptococcus pneumoniae ([place])   . Fever   . Community acquired pneumonia of right lung   . Palliative care by specialist   . Goals of care, counseling/discussion   . Pressure injury of skin 07/03/2016  . COPD exacerbation ([place]) 07/21/2015  . Acute renal insufficiency 07/21/2015  . Hyperglycemia 07/21/2015  . Benign essential HTN 07/21/2015  . S/P shoulder replacement 07/02/2013   Pura Spice, PT, DPT # [phone] 10/27/2018, 1:55 PM  [facility] [address] [place], [name], [address] Phone: [phone]   Fax:  [phone]  Name: Jalayia Bagheri MRN:  [id number] Date of Birth: [birth year]

## 2018-10-29 ENCOUNTER — Ambulatory Visit: Payer: Medicare HMO | Admitting: Physical Therapy

## 2018-10-29 ENCOUNTER — Other Ambulatory Visit: Payer: Self-pay | Admitting: Internal Medicine

## 2018-10-30 ENCOUNTER — Ambulatory Visit: Payer: Medicare HMO | Admitting: Physical Therapy

## 2018-10-30 ENCOUNTER — Other Ambulatory Visit: Payer: Self-pay

## 2018-10-30 DIAGNOSIS — M6281 Muscle weakness (generalized): Secondary | ICD-10-CM

## 2018-10-30 DIAGNOSIS — R293 Abnormal posture: Secondary | ICD-10-CM

## 2018-10-30 DIAGNOSIS — R269 Unspecified abnormalities of gait and mobility: Secondary | ICD-10-CM | POA: Diagnosis not present

## 2018-10-30 IMAGING — DX DG CHEST 1V PORT
1 series · 1 of 1 positions shown · non-contrast
Comparison: Prior radiograph from 07/06/2016.

CLINICAL DATA: Initial evaluation for acute respiratory failure.

EXAM:
PORTABLE CHEST 1 VIEW

[chest ap]
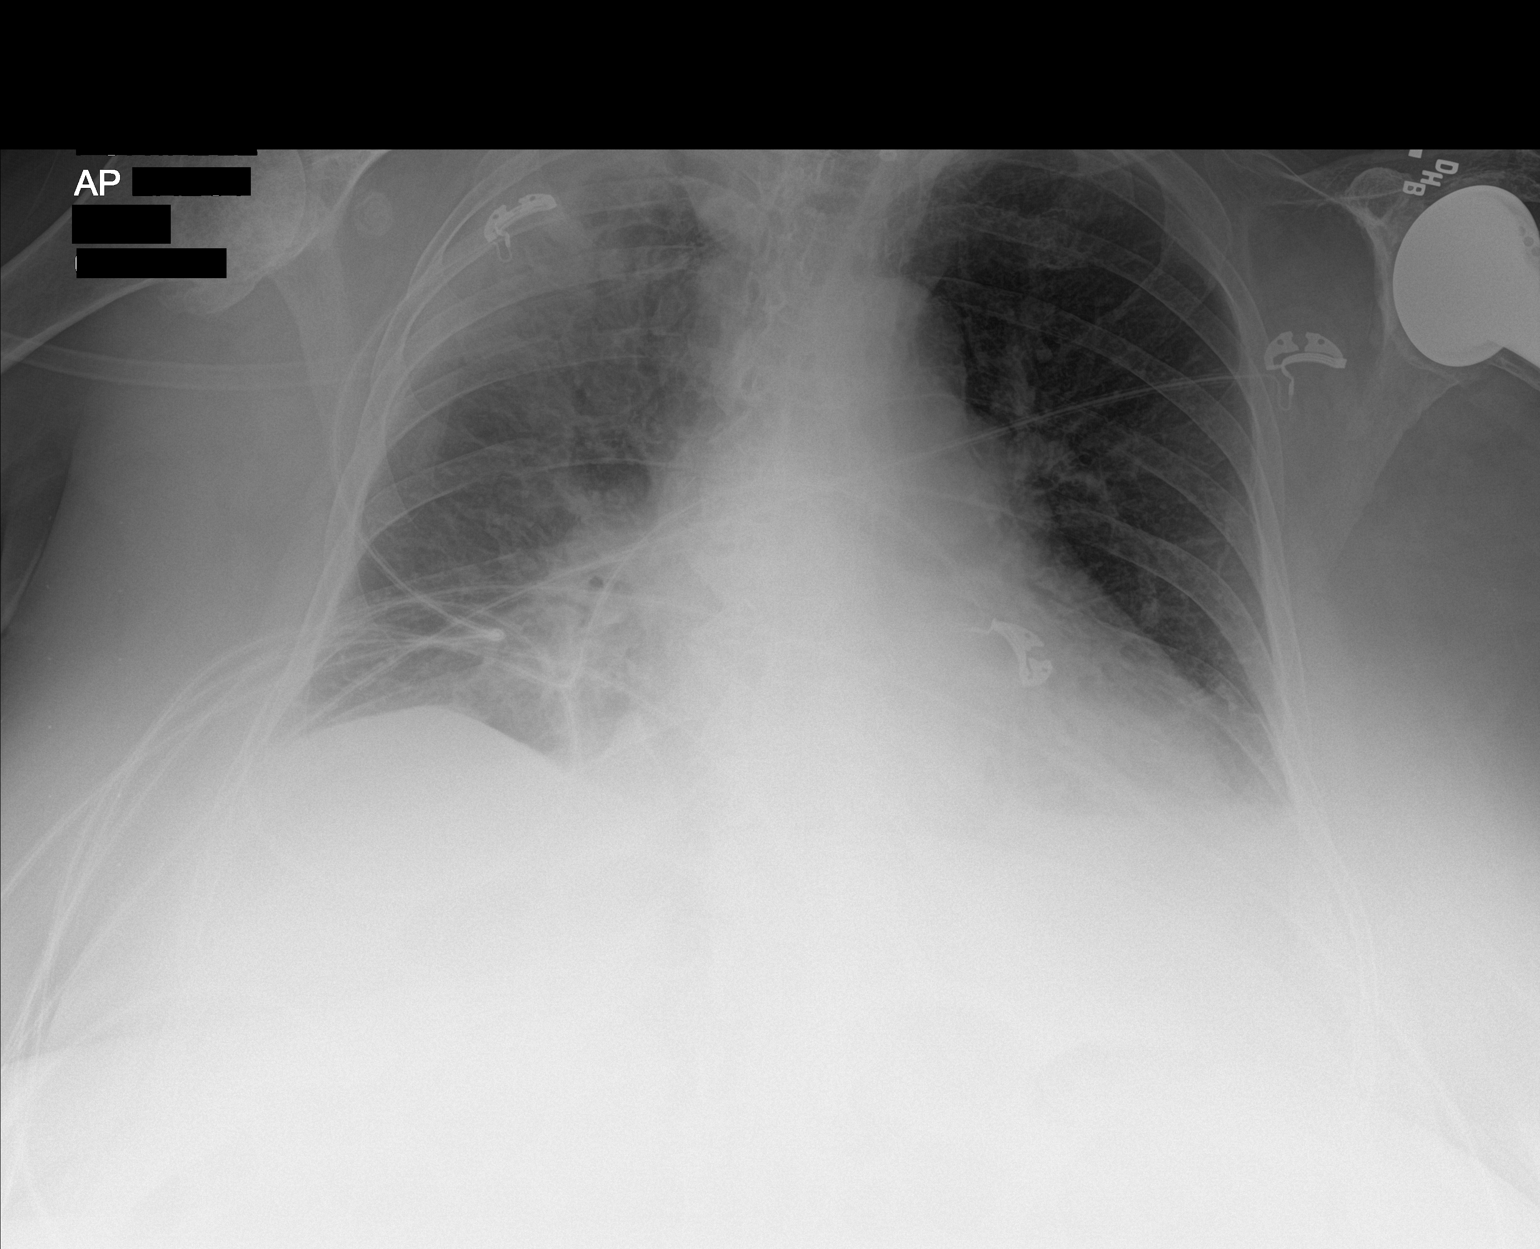

[1 of 1 positions shown; findings below may reference images not displayed]

FINDINGS: Stable cardiomegaly.  Mediastinal silhouette within normal limits.

Lungs mildly hypoinflated. Previously noted right upper lobe
infiltrate again seen, improved from previous. Improved underlying
vascular congestion as compared to prior. Increased density at the
left lung base may reflect atelectasis or infiltrate. Possible small
left pleural effusion. No pneumothorax.

No acute osseus abnormality. Cervical ACDF and left shoulder
arthroplasty noted. Degenerative changes noted at the right
shoulder.
IMPRESSION: 1. Persistent but improved right upper lobe infiltrate, suggesting
improved pneumonia.
2. Improved underlying pulmonary vascular congestion as compared to
07/06/16.
3. Increased density at the left lung base, which may reflect
atelectasis or infiltrate. Possible small left pleural effusion may
be present.

## 2018-10-31 ENCOUNTER — Encounter: Payer: Self-pay | Admitting: Physical Therapy

## 2018-10-31 NOTE — Therapy (Signed)
[facility] [address] [place], [name], [address] Phone: [phone]   Fax:  [phone]  Physical Therapy Treatment  Patient Details  Name: Hannah Powell MRN: [id number] Date of Birth: 07-Jun-1945 Referring Provider (PT): Dr. Ramonita Lab III   Encounter Date: 10/30/2018  PT End of Session - 10/31/18 1019    Visit Number  15    Number of Visits  16    Date for PT Re-Evaluation  11/03/18    Authorization - Visit Number  8    Authorization - Number of Visits  10    PT Start Time  5573    PT Stop Time  1449    PT Time Calculation (min)  65 min    Equipment Utilized During Treatment  Gait belt    Activity Tolerance  Patient tolerated treatment well;Patient limited by fatigue    Behavior During Therapy  [facility] for tasks assessed/performed       Past Medical History:  Diagnosis Date  . Anxiety   . Arthritis   . CHF (congestive heart failure) ([place])   . COPD (chronic obstructive pulmonary disease) ([place])   . Edema    feet/legs  . Hypertension    dr Otho Najjar     . Hypothyroidism   . Leg weakness, bilateral   . Osteoporosis   . Oxygen deficit    2l with bipap at night  . RLS (restless legs syndrome)   . Shortness of breath   . Sleep apnea    bipap  . Wheezing     Past Surgical History:  Procedure Laterality Date  . BACK SURGERY     cervical  . BREAST BIOPSY Left    needle bx-neg  . CATARACT EXTRACTION W/PHACO Left 05/07/2017   Procedure: CATARACT EXTRACTION PHACO AND INTRAOCULAR LENS PLACEMENT (IOC);  Surgeon: Birder Robson, MD;  Location: [facility];  Service: Ophthalmology;  Laterality: Left;  [place] 00:48.7 AP% 11.3 CDE 5.46 Fluid Pack Lot # [id number]  . CATARACT EXTRACTION W/PHACO Right 04/23/2017   Procedure: CATARACT EXTRACTION PHACO AND INTRAOCULAR LENS PLACEMENT ([place]);  Surgeon: Birder Robson, MD;  Location: [facility];  Service: Ophthalmology;  Laterality: Right;  [place] 00:29 AP% 12.8 CDE 3.79 FLUID PACK LOT  # [id number] H  . FOOT ARTHROPLASTY    . JOINT REPLACEMENT     x2 tkr  . TOTAL SHOULDER ARTHROPLASTY Left 07/02/2013   Procedure: LEFT TOTAL SHOULDER ARTHROPLASTY;  Surgeon: Marin Shutter, MD;  Location: [place];  Service: Orthopedics;  Laterality: Left;  . TOTAL SHOULDER ARTHROPLASTY Right 04/10/2018   Procedure: TOTAL REVERSE SHOULDER ARTHROPLASTY;  Surgeon: Justice Britain, MD;  [facility];  Service: Orthopedics;  Laterality: Right;  175mn    There were no vitals filed for this visit.  Subjective Assessment - 10/31/18 1017    Subjective  Pt reports no pain today. Pt reports she has not performed as many exercises from HEP since she did not feel well the past few days. Pt reports she feels better today.    Pertinent History  extensive PMH including intensive care unit stays, rehab facility stays, previously ambulated with RW.. COPD, SOB, HTN. Denies CHF, reported she has been cleared by a cardiologist.    Limitations  Walking;Lifting;Standing;Writing;House hold activities    How long can you stand comfortably?  3-4 hours    How long can you walk comfortably?  3-4 hours    Diagnostic tests  last x-rays of left knee where in November 2016  which looked good;     Patient Stated Goals  Increase B LE muscle strength/ safety with gait    Currently in Pain?  No/denies         Therapeutic exercise  Nustep L7, 10 min Gait with hemi-walker 2x around clinic - Cuing for technique/stand closer to walker/posture Seated LAQ and marches with 4#  2x20 each Heel raises with 4# and 5# weights - 1x20 each. Greater ROM on R LE compared to L  Adductor squeeze with ball 15x with 3 second hold Sit<>stands, modified for increased seat height - 5x. Able to complete 2 sit<>stands w/o UE assist Seated core RTB twists 20x B Standing hip abduction in // bars with UE support 20x      PT Education - 10/31/18 1018    Education Details  Pt educated on technique with hemi walker    Person(s) Educated  Patient     Methods  Explanation;Demonstration    Comprehension  Verbalized understanding;Returned demonstration       PT Short Term Goals - 08/12/18 1236      PT SHORT TERM GOAL #1   Title  Pt. will increase AAROM with ER to 30 degrees in neutral  without increase in pain to regain independent self-care tasks.     Baseline  30 deg. ER on R.      Time  4    Period  Weeks    Status  Achieved    Target Date  06/05/18        PT Long Term Goals - 10/06/18 1513      PT LONG TERM GOAL #1   Title  Pt. will increase FOTO to 56 to improve functional mobility.    Baseline  Initial FOTO: 51; FOTO 57 10/06/2018    Time  4    Period  Weeks    Status  Achieved    Target Date  10/06/18      PT LONG TERM GOAL #2   Title  Pt. will be independent with HEP to increase B LE muscle strength 1/2 muscle grade to improve standing tolerance/ gait.    Baseline  B LE AROM: limited L ankle AROM as compared to R ankle. B LE muscle weakness: hip flexor 4/5 MMT, quads/ hamstring 4+/5 MMT, hip abduction 4+/5 MMT, hip adduction 4/5 MMT, ankle 4/5 MMT except PF 3+/5 MMT.; L/R: Hip flexion 4/5, 4+/5; Knee extension 5/5 B Knee flexion 5/5 B' DF 5/5 B 10/06/2018    Time  4    Period  Weeks    Status  Partially Met    Target Date  11/03/18      PT LONG TERM GOAL #3   Title  Pt. will demonstrate improved upright posture for >15 minutes with standing ther.ex.    Baseline  Poor standing posture/ forward head/ rounded sh; Pt maintained improved upright posture for 10 minutes before requiring cuing 10/06/2018    Time  4    Period  Weeks    Status  Partially Met    Target Date  11/03/18      PT LONG TERM GOAL #4   Title  Pt. able to ambulate outside with consistent hip flexion/ heel strike and least assistive device safely.    Baseline  Pt. ambulates with RW with limited hip flexion/ increase fall risk; Pt with improved heelstrike, but inconsistend hip flexion 10/06/2018    Time  4    Period  Weeks    Status  Partially Met  Target Date  11/03/18      PT LONG TERM GOAL #5   Title  Pt will ambulate with consistent reciprocal step pattern 100% of the time with minor cuing instead of step-to gait pattern with walker to improve mobility gait mechanics.    Baseline  Pt requires cuing ~80% of the time to increase step length.    Time  4    Period  Weeks    Status  New    Target Date  11/03/18         Plan - 10/31/18 1020    Clinical Impression Statement  Pt demonstrates improvement in therapy today with demonstrating modified sit<>stands without UE support for a couple reps. Pt required UE assist for remaining sit<>stand reps, indicating decreased LE strength and, still requires cuing for technique. Pt used hemi walker for ambulating in clinic today and reported increased fatigue compared to using RW for ambulation. Pt also required cuing for improved posture and for technique/to stand closer to hemi walker. Pt will continue to benefit from further skilled therapy to improve LE strength and gait mechanics.    Personal Factors and Comorbidities  Age    Stability/Clinical Decision Making  Evolving/Moderate complexity    Clinical Decision Making  Moderate    Rehab Potential  Good    Clinical Impairments Affecting Rehab Potential  positive: motivation, family support, negative: co-morbidities, deconditioned, high fall risk; Body structures that will need to be addressed: weakness, numbness/tingling, impaired balance, difficulty walking, decreased transfer ability, etc; Due to co-morbidities her clinical presenstation is evolving;     PT Frequency  2x / week    PT Duration  4 weeks    PT Treatment/Interventions  Cryotherapy;Electrical Stimulation;Ultrasound;Moist Heat;Gait training;Functional mobility training;Therapeutic activities;Therapeutic exercise;Neuromuscular re-education;Scar mobilization;Balance training;Stair training;Patient/family education;Manual techniques    PT Next Visit Plan  re-assess goals    PT  Home Exercise Plan  See prescribed HEP.     Consulted and Agree with Plan of Care  Patient       Patient will benefit from skilled therapeutic intervention in order to improve the following deficits and impairments:  Decreased endurance, Decreased range of motion, Decreased skin integrity, Decreased strength, Hypomobility, Impaired UE functional use, Increased fascial restricitons, Pain, Postural dysfunction, Impaired flexibility, Decreased scar mobility, Decreased mobility, Decreased balance, Improper body mechanics  Visit Diagnosis: 1. Muscle weakness (generalized)   2. Gait difficulty   3. Abnormal posture        Problem List Patient Active Problem List   Diagnosis Date Noted  . S/P reverse total shoulder arthroplasty, 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  . Chronic diastolic heart failure ([place]) 08/09/2016  . COPD (chronic obstructive pulmonary disease) ([place]) 08/09/2016  . Pulmonary infiltrates   . Pneumonia due to Streptococcus pneumoniae ([place])   . Fever   . Community acquired pneumonia of right lung   . Palliative care by specialist   . Goals of care, counseling/discussion   . Pressure injury of skin 07/03/2016  . COPD exacerbation ([place]) 07/21/2015  . Acute renal insufficiency 07/21/2015  . Hyperglycemia 07/21/2015  . Benign essential HTN 07/21/2015  . S/P shoulder replacement 07/02/2013   Pura Spice, PT, DPT # [address] SPT 10/31/2018, 10:28 AM  [facility] [address] [place], [name], [address] Phone: [phone]   Fax:  [phone]  Name: Hannah Powell MRN: [id number] Date of Birth: 1945-10-30

## 2018-11-03 DIAGNOSIS — J9601 Acute respiratory failure with hypoxia: Secondary | ICD-10-CM | POA: Diagnosis not present

## 2018-11-03 DIAGNOSIS — I5032 Chronic diastolic (congestive) heart failure: Secondary | ICD-10-CM | POA: Diagnosis not present

## 2018-11-03 DIAGNOSIS — J449 Chronic obstructive pulmonary disease, unspecified: Secondary | ICD-10-CM | POA: Diagnosis not present

## 2018-11-03 DIAGNOSIS — J441 Chronic obstructive pulmonary disease with (acute) exacerbation: Secondary | ICD-10-CM | POA: Diagnosis not present

## 2018-11-03 DIAGNOSIS — R0689 Other abnormalities of breathing: Secondary | ICD-10-CM | POA: Diagnosis not present

## 2018-11-03 DIAGNOSIS — R0902 Hypoxemia: Secondary | ICD-10-CM | POA: Diagnosis not present

## 2018-11-03 DIAGNOSIS — I509 Heart failure, unspecified: Secondary | ICD-10-CM | POA: Diagnosis not present

## 2018-11-04 ENCOUNTER — Other Ambulatory Visit: Payer: Self-pay

## 2018-11-04 ENCOUNTER — Ambulatory Visit: Payer: Medicare HMO | Admitting: Physical Therapy

## 2018-11-04 DIAGNOSIS — R269 Unspecified abnormalities of gait and mobility: Secondary | ICD-10-CM

## 2018-11-04 DIAGNOSIS — R293 Abnormal posture: Secondary | ICD-10-CM | POA: Diagnosis not present

## 2018-11-04 DIAGNOSIS — M6281 Muscle weakness (generalized): Secondary | ICD-10-CM

## 2018-11-04 NOTE — Therapy (Signed)
[facility] [address]. [place], [name], [address] Phone: [phone]   Fax:  [phone]  Physical Therapy Treatment  Patient Details  Name: Hannah Powell MRN: [id number] Date of Birth: [birth year] Referring Provider (PT): Dr. Ramonita Lab III   Encounter Date: 11/04/2018  PT End of Session - 11/04/18 1041    Visit Number  16    Number of Visits  24    Date for PT Re-Evaluation  12/02/18    Authorization - Visit Number  1    Authorization - Number of Visits  10    PT Start Time  0944    PT Stop Time  1031    PT Time Calculation (min)  47 min    Equipment Utilized During Treatment  Gait belt    Activity Tolerance  Patient tolerated treatment well;Patient limited by fatigue    Behavior During Therapy  [facility] for tasks assessed/performed       Past Medical History:  Diagnosis Date  . Anxiety   . Arthritis   . CHF (congestive heart failure) ([place])   . COPD (chronic obstructive pulmonary disease) ([place])   . Edema    feet/legs  . Hypertension    dr Otho Najjar     . Hypothyroidism   . Leg weakness, bilateral   . Osteoporosis   . Oxygen deficit    2l with bipap at night  . RLS (restless legs syndrome)   . Shortness of breath   . Sleep apnea    bipap  . Wheezing     Past Surgical History:  Procedure Laterality Date  . BACK SURGERY     cervical  . BREAST BIOPSY Left    needle bx-neg  . CATARACT EXTRACTION W/PHACO Left 05/07/2017   Procedure: CATARACT EXTRACTION PHACO AND INTRAOCULAR LENS PLACEMENT (IOC);  Surgeon: Birder Robson, MD;  Location: [facility];  Service: Ophthalmology;  Laterality: Left;  [place] 00:48.7 AP% 11.3 CDE 5.46 Fluid Pack Lot # [id number]  . CATARACT EXTRACTION W/PHACO Right 04/23/2017   Procedure: CATARACT EXTRACTION PHACO AND INTRAOCULAR LENS PLACEMENT ([place]);  Surgeon: Birder Robson, MD;  Location: [facility];  Service: Ophthalmology;  Laterality: Right;  [place] 00:29 AP% 12.8 CDE 3.79 FLUID PACK LOT  # [id number]  . FOOT ARTHROPLASTY    . JOINT REPLACEMENT     x2 tkr  . TOTAL SHOULDER ARTHROPLASTY Left 07/02/2013   Procedure: LEFT TOTAL SHOULDER ARTHROPLASTY;  Surgeon: Marin Shutter, MD;  Location: [place];  Service: Orthopedics;  Laterality: Left;  . TOTAL SHOULDER ARTHROPLASTY Right 04/10/2018   Procedure: TOTAL REVERSE SHOULDER ARTHROPLASTY;  Surgeon: Justice Britain, MD;  [facility];  Service: Orthopedics;  Laterality: Right;  132mn    There were no vitals filed for this visit.  Subjective Assessment - 11/04/18 1039    Subjective  Pt reports 0/10 pain today. [facility] and [facility] now. Pt reports she went to gym since previous session and has been performing HEP. Pt reports slight R low back muscle pain after previous therapy session that resolved with use of heat and "pain patch."    Pertinent History  extensive PMH including intensive care unit stays, rehab facility stays, previously ambulated with RW.. COPD, SOB, HTN. Denies CHF, reported she has been cleared by a cardiologist.    Limitations  Walking;Lifting;Standing;Writing;House hold activities    How long can you stand comfortably?  3-4 hours    How long  can you walk comfortably?  3-4 hours    Diagnostic tests  last x-rays of left knee where in November 2016 which looked good;     Patient Stated Goals  Increase B LE muscle strength/ safety with gait    Currently in Pain?  No/denies       Therapeutic Exercise:  Nustep L6 10 min Modified sit<>stand with staggered stance 2x B LEs with unilateral UE assist Modified sit<>stand 3x with UE assist  Seated marches with 5# ankle weights 2x20 Seated LAQs with 5# ankle weights 2x20 Standing core twists with GTB 2x10 B. Cuing for technique Ambulation around clinic and outside on uneven surfaces, inclines, curbs. Cuing provided for technique/posture/step length and gait speed.  [facility] PT Assessment - 11/05/18 0001      Assessment    Medical Diagnosis  Bilateral leg weakness/ Gait difficulty    Referring Provider (PT)  Dr. Ramonita Lab III    Onset Date/Surgical Date  03/19/18    Hand Dominance  Right    Prior Therapy  Pt. known to PT clinic      Prior Function   Level of Independence  Requires assistive device for independence      Cognition   Overall Cognitive Status  Within Functional Limits for tasks assessed       PT Education - 11/04/18 1040    Education Details  Pt educated on importance of posture and standing closer to RW to reduce reliance on UE strength.    Person(s) Educated  Patient    Methods  Explanation;Demonstration    Comprehension  Verbalized understanding;Returned demonstration       PT Short Term Goals - 08/12/18 1236      PT SHORT TERM GOAL #1   Title  Pt. will increase AAROM with ER to 30 degrees in neutral  without increase in pain to regain independent self-care tasks.     Baseline  30 deg. ER on R.      Time  4    Period  Weeks    Status  Achieved    Target Date  06/05/18        PT Long Term Goals - 11/04/18 1324      PT LONG TERM GOAL #1   Title  Pt. will increase FOTO to 56 to improve functional mobility.    Baseline  Initial FOTO: 51; FOTO 57 10/06/2018    Time  4    Period  Weeks    Status  On-going    Target Date  12/02/18      PT LONG TERM GOAL #2   Title  Pt. will be independent with HEP to increase B LE muscle strength 1/2 muscle grade to improve standing tolerance/ gait.    Baseline  B LE AROM: limited L ankle AROM as compared to R ankle. B LE muscle weakness: hip flexor 4/5 MMT, quads/ hamstring 4+/5 MMT, hip abduction 4+/5 MMT, hip adduction 4/5 MMT, ankle 4/5 MMT except PF 3+/5 MMT.; L/R: Hip flexion 4/5, 4+/5; Knee extension 5/5 B Knee flexion 5/5 B' DF 5/5 B 10/06/2018    Time  4    Period  Weeks    Status  Partially Met    Target Date  12/02/18      PT LONG TERM GOAL #3   Title  Pt. will demonstrate improved upright posture for >15 minutes with standing  ther.ex.    Baseline  Poor standing posture/ forward head/ rounded sh; Pt maintained improved upright posture  for 10 minutes before requiring cuing 10/06/2018; pt able to self correct posture without cuing ~50% of time, demonstrating improved upright posture with standing activities still within still for ~10 min 11/04/2018    Time  4    Period  Weeks    Status  Partially Met    Target Date  12/02/18      PT LONG TERM GOAL #4   Title  Pt. able to ambulate outside with consistent hip flexion/ heel strike and least restrictive assistive device safely.    Baseline  Pt. ambulates with RW with limited hip flexion/ increase fall risk; Pt with improved heelstrike, but inconsistend hip flexion 10/06/2018    Time  4    Period  Weeks    Status  Partially Met    Target Date  12/02/18      PT LONG TERM GOAL #5   Title  Pt will ambulate with consistent reciprocal step pattern 100% of the time with minor cuing instead of step-to gait pattern with walker to improve mobility gait mechanics.    Baseline  Pt requires cuing ~80% of the time to increase step length; pt requiring cuing ~50-60% time to increase step length 11/04/2018    Time  4    Period  Weeks    Status  Partially Met    Target Date  12/02/18            Plan - 11/04/18 1041    Clinical Impression Statement  Pt with decreased L step length with ambulation outside over uneven surfaces and curbs, but was able to correct technique intermittently with cuing. Pt step-length and gait speed decreased further as pt approached and navigated curbs. Pt also required cuing to stand closer to RW/improve posture and rely less on weightbearing through B UEs. Pt able to perform 2 reps on each LE of modified sit<>stand with staggered stance and only one UE for support, but fatigued quickly and had difficulty completing reps of modified sit<>stands with B LEs. Pt will continue to benefit from further skilled therapy to increase LE strength/power and improve gait  mechanics.    Personal Factors and Comorbidities  Age    Stability/Clinical Decision Making  Evolving/Moderate complexity    Clinical Decision Making  Moderate    Rehab Potential  Good    Clinical Impairments Affecting Rehab Potential  positive: motivation, family support, negative: co-morbidities, deconditioned, high fall risk; Body structures that will need to be addressed: weakness, numbness/tingling, impaired balance, difficulty walking, decreased transfer ability, etc; Due to co-morbidities her clinical presenstation is evolving;     PT Frequency  2x / week    PT Duration  4 weeks    PT Treatment/Interventions  Cryotherapy;Electrical Stimulation;Ultrasound;Moist Heat;Gait training;Functional mobility training;Therapeutic activities;Therapeutic exercise;Neuromuscular re-education;Scar mobilization;Balance training;Stair training;Patient/family education;Manual techniques    PT Next Visit Plan  Progress LE strengthening/ gait and upright posture    PT Home Exercise Plan  See prescribed HEP.     Consulted and Agree with Plan of Care  Patient       Patient will benefit from skilled therapeutic intervention in order to improve the following deficits and impairments:  Decreased endurance, Decreased range of motion, Decreased skin integrity, Decreased strength, Hypomobility, Impaired UE functional use, Increased fascial restricitons, Pain, Postural dysfunction, Impaired flexibility, Decreased scar mobility, Decreased mobility, Decreased balance, Improper body mechanics  Visit Diagnosis: 1. Muscle weakness (generalized)   2. Gait difficulty   3. Abnormal posture        Problem List  Patient Active Problem List   Diagnosis Date Noted  . S/P reverse total shoulder arthroplasty, 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  . Chronic diastolic heart failure ([place]) 08/09/2016  . COPD (chronic obstructive pulmonary disease) ([place]) 08/09/2016  . Pulmonary infiltrates   . Pneumonia due to Streptococcus pneumoniae ([place])   .  Fever   . Community acquired pneumonia of right lung   . Palliative care by specialist   . Goals of care, counseling/discussion   . Pressure injury of skin 07/03/2016  . COPD exacerbation ([place]) 07/21/2015  . Acute renal insufficiency 07/21/2015  . Hyperglycemia 07/21/2015  . Benign essential HTN 07/21/2015  . S/P shoulder replacement 07/02/2013   Pura Spice, PT, DPT # [id number] SPT 11/05/2018, 7:31 AM  [facility] [address] [place], [name], [address] Phone: [phone]   Fax:  [phone]  Name: Hannah Powell MRN: [id number] Date of Birth: Aug 20, 1945

## 2018-11-05 ENCOUNTER — Encounter: Payer: Self-pay | Admitting: Physical Therapy

## 2018-11-05 IMAGING — CT CT CHEST W/ CM
2 of 5 series · 13 of 36 positions shown, 16 images · IV contrast (iopamidol)
Comparison: Recent radiography

CLINICAL DATA: Acute kidney injury. Pneumonia and sepsis.
Respiratory failure. Ventilator support.

EXAM:
CT CHEST, ABDOMEN, AND PELVIS WITH CONTRAST
TECHNIQUE: Multidetector CT imaging of the chest, abdomen and pelvis was
performed following the standard protocol during bolus
administration of intravenous contrast.
CONTRAST:  100mL XQF32K-RNN IOPAMIDOL (XQF32K-RNN) INJECTION 61%

[Series 2: cap with · axial · 0.97mm/px · z∈[-1066,-581]mm · 10 of 119 slices shown, 13 images]
[im 11/119  mediastinal]
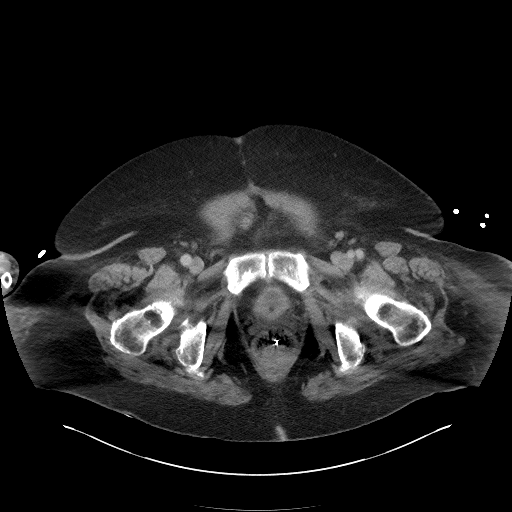
[im 11/119  lung]
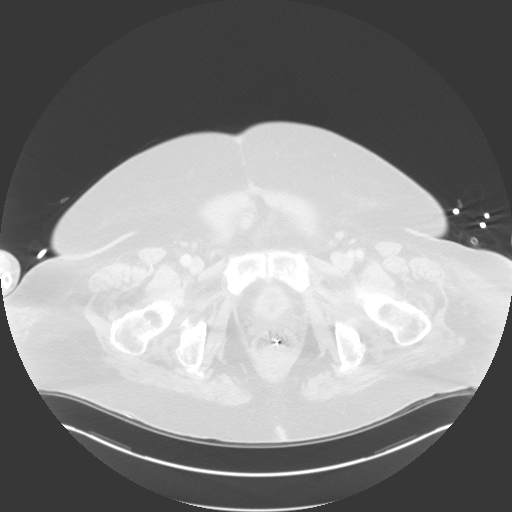
[im 22/119  lung]
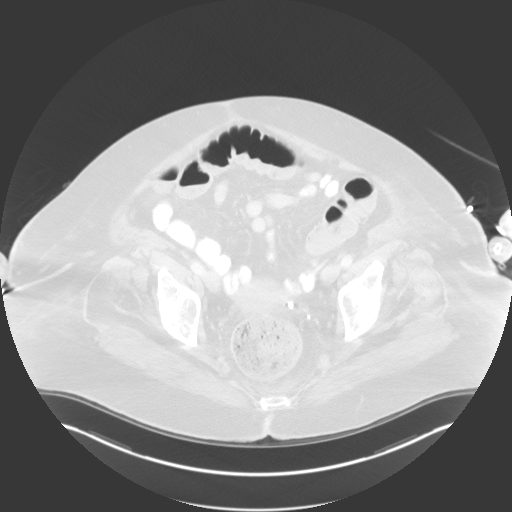
[im 33/119  lung]
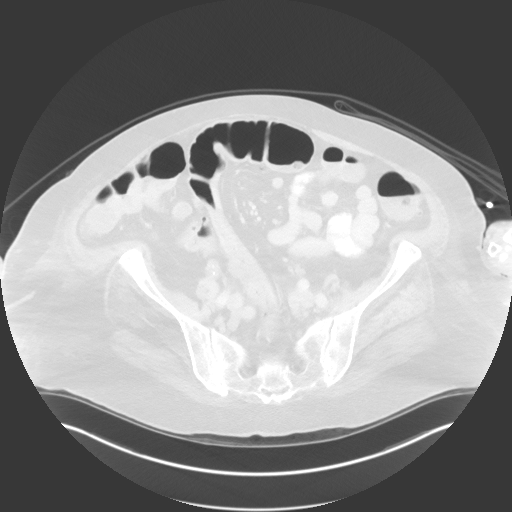
[im 43/119  lung]
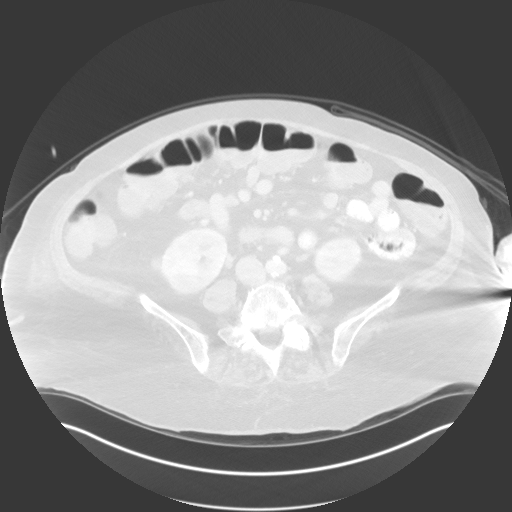
[im 54/119  mediastinal]
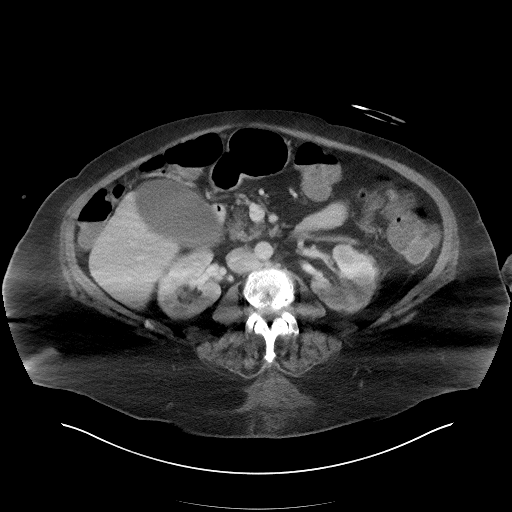
[im 54/119  lung]
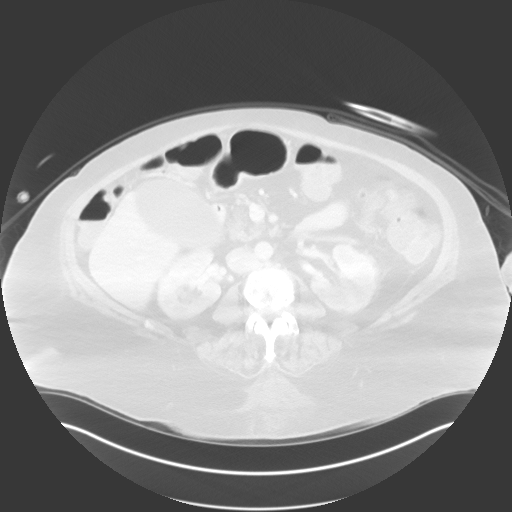
[im 65/119  lung]
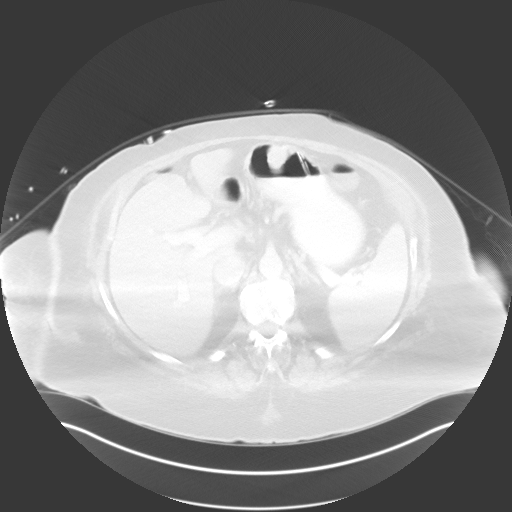
[im 76/119  lung]
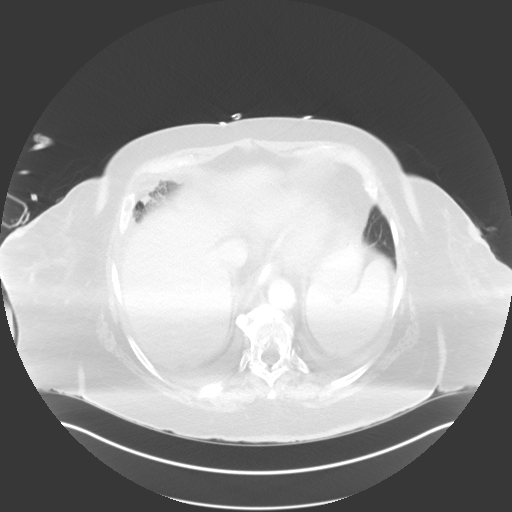
[im 86/119  lung]
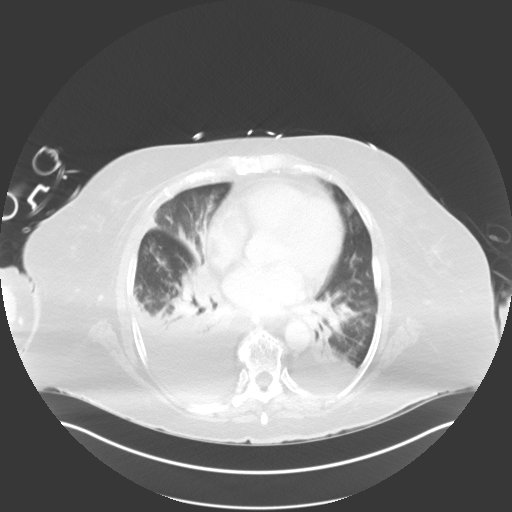
[im 97/119  mediastinal]
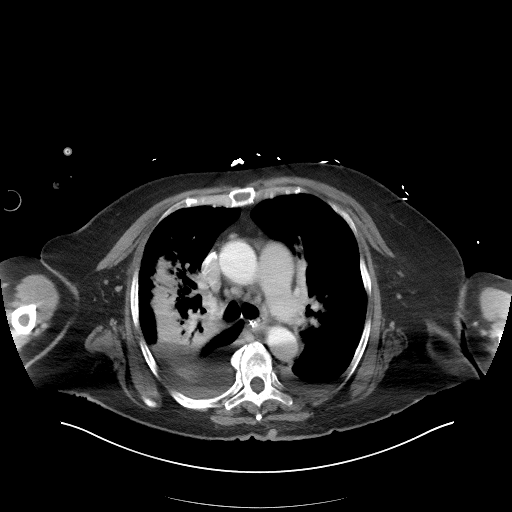
[im 97/119  lung]
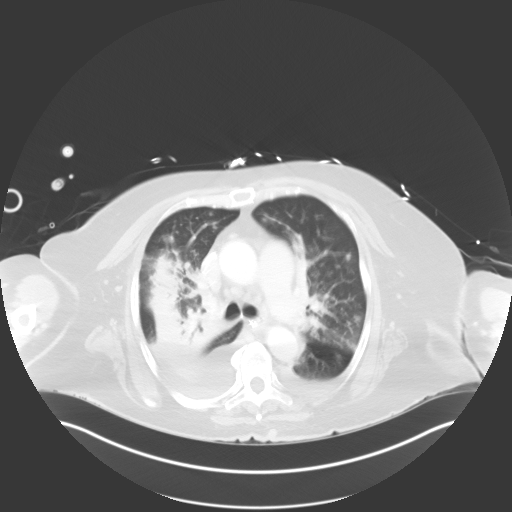
[im 108/119  lung]
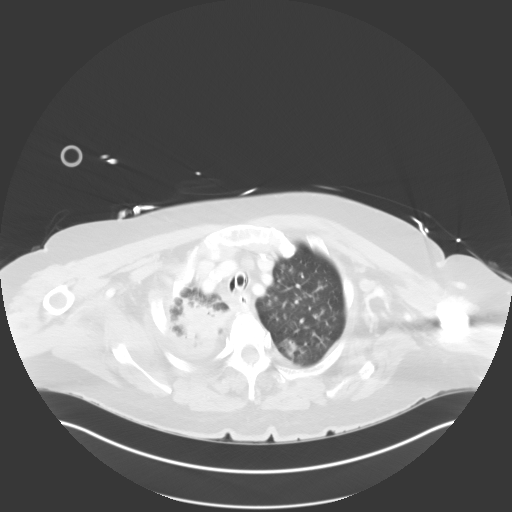

[Series 5: coronals · coronal · 0.76mm/px · 3 of 166 slices shown]
[im 34/166  lung]
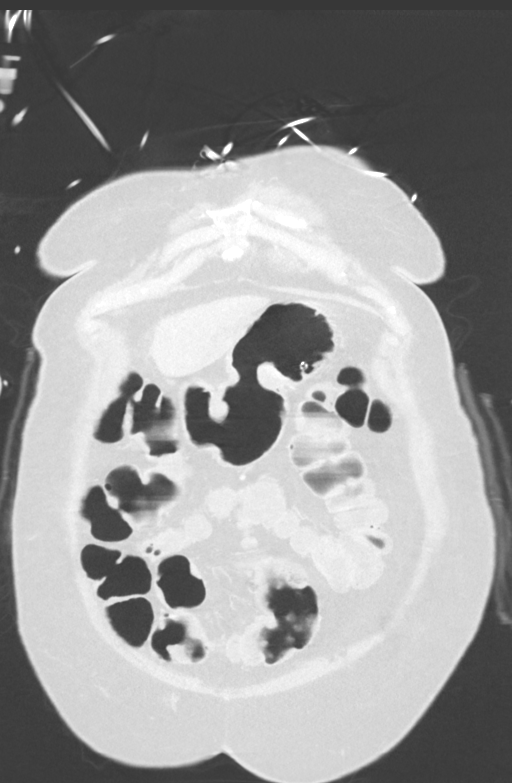
[im 67/166  lung]
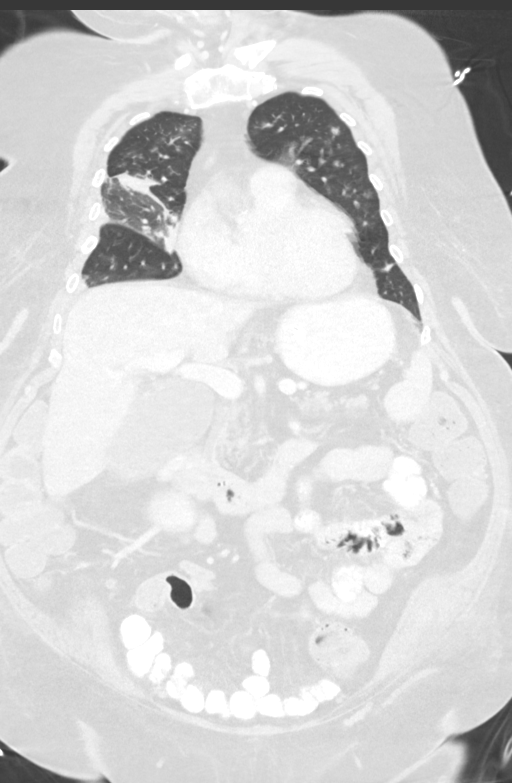
[im 100/166  lung]
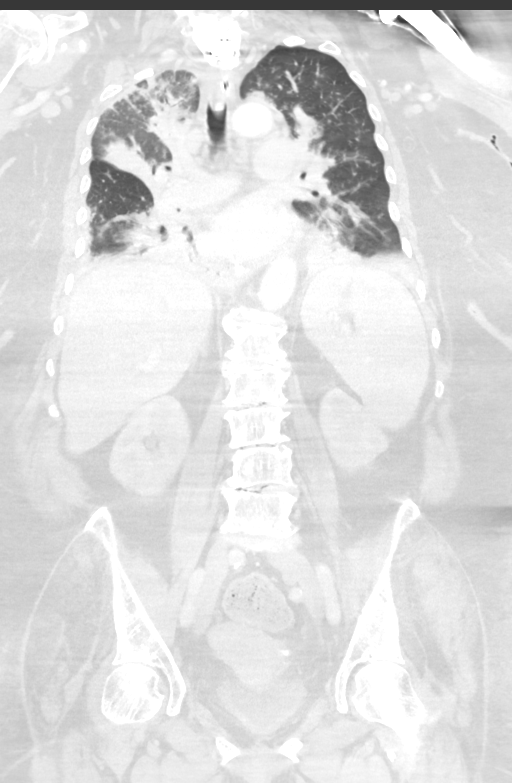

[13 of 36 positions shown; findings below may reference images not displayed]

FINDINGS: CT CHEST FINDINGS

Cardiovascular: Aortic atherosclerosis. No aneurysm or dissection.
Some coronary artery calcification. Mitral valve region
calcification. No pericardial fluid. No definable pulmonary arterial
abnormality.

Mediastinum/Nodes: Small reactive mediastinal lymph nodes.

Lungs/Pleura: Bilateral effusions layering dependently, larger on
the right than the left. On the left, there are areas of patchy
bronchopneumonia in the left upper lobe. The left lower lobe shows
subtotal collapse. The lung not collapsed shows scattered patchy
airspace densities. On the right, the upper lobe shows extensive
infiltrate in subtotal collapse. The lower lobe shows subtotal
collapse and moderate infiltrate. The right middle lobe shows
moderate collapse and infiltrate.

Musculoskeletal: Ordinary degenerative changes. Old compression
fracture T7.

Endotracheal tube tip is in the trachea just above the carina.
Nasogastric tube enters the abdomen.

CT ABDOMEN PELVIS FINDINGS

Hepatobiliary: Normal

Pancreas: Fatty replacement.  Otherwise normal.

Spleen: Normal

Adrenals/Urinary Tract: Adrenal glands are normal. No
hydronephrosis. Poorly functioning kidneys. Benign appearing 2 cm
cyst lateral right kidney.

Stomach/Bowel: No acute or significant bowel finding.

Vascular/Lymphatic: Aortic atherosclerosis. No aneurysm. IVC is
normal. No retroperitoneal mass or adenopathy.

Reproductive: Negative

Other: No free fluid or air.

Musculoskeletal: Old appearing lower thoracic compression fracture.
Extensive chronic degenerative changes throughout the lumbar spine.
IMPRESSION: Bilateral effusions layering dependently, larger on the right than
the left. Associated pulmonary collapse affecting all lobes to a
degree with the exception of the left upper lobe. The portions of
the lungs that are not collapsed show patchy airspace filling
consistent with bronchopneumonia.

No significant intraabdominal finding, other than delayed excretion
by the kidneys.

## 2018-11-06 ENCOUNTER — Ambulatory Visit: Payer: Medicare HMO | Admitting: Physical Therapy

## 2018-11-06 ENCOUNTER — Encounter: Payer: Self-pay | Admitting: Physical Therapy

## 2018-11-06 ENCOUNTER — Other Ambulatory Visit: Payer: Self-pay

## 2018-11-06 DIAGNOSIS — R293 Abnormal posture: Secondary | ICD-10-CM | POA: Diagnosis not present

## 2018-11-06 DIAGNOSIS — R269 Unspecified abnormalities of gait and mobility: Secondary | ICD-10-CM | POA: Diagnosis not present

## 2018-11-06 DIAGNOSIS — M6281 Muscle weakness (generalized): Secondary | ICD-10-CM

## 2018-11-06 NOTE — Therapy (Signed)
[facility] [address] [place], [name], [address] Phone: [phone]   Fax:  [phone]  Physical Therapy Treatment  Patient Details  Name: Hannah Powell MRN: [id number] Date of Birth: Oct 05, 1945 Referring Provider (PT): Dr. Ramonita Lab III   Encounter Date: 11/06/2018  PT End of Session - 11/06/18 1113    Visit Number  17    Number of Visits  24    Date for PT Re-Evaluation  12/02/18    Authorization - Visit Number  2    Authorization - Number of Visits  10    PT Start Time  0851    PT Stop Time  0944    PT Time Calculation (min)  53 min    Equipment Utilized During Treatment  Gait belt    Activity Tolerance  Patient tolerated treatment well;Patient limited by fatigue    Behavior During Therapy  [facility] for tasks assessed/performed       Past Medical History:  Diagnosis Date  . Anxiety   . Arthritis   . CHF (congestive heart failure) ([place])   . COPD (chronic obstructive pulmonary disease) ([place])   . Edema    feet/legs  . Hypertension    dr Otho Najjar     . Hypothyroidism   . Leg weakness, bilateral   . Osteoporosis   . Oxygen deficit    2l with bipap at night  . RLS (restless legs syndrome)   . Shortness of breath   . Sleep apnea    bipap  . Wheezing     Past Surgical History:  Procedure Laterality Date  . BACK SURGERY     cervical  . BREAST BIOPSY Left    needle bx-neg  . CATARACT EXTRACTION W/PHACO Left 05/07/2017   Procedure: CATARACT EXTRACTION PHACO AND INTRAOCULAR LENS PLACEMENT (IOC);  Surgeon: Birder Robson, MD;  Location: [facility];  Service: Ophthalmology;  Laterality: Left;  [place] 00:48.7 AP% 11.3 CDE 5.46 Fluid Pack Lot # [id number]  . CATARACT EXTRACTION W/PHACO Right 04/23/2017   Procedure: CATARACT EXTRACTION PHACO AND INTRAOCULAR LENS PLACEMENT ([place]);  Surgeon: Birder Robson, MD;  Location: [facility];  Service: Ophthalmology;  Laterality: Right;  [place] 00:29 AP% 12.8 CDE 3.79 FLUID PACK LOT  # [id number]  . FOOT ARTHROPLASTY    . JOINT REPLACEMENT     x2 tkr  . TOTAL SHOULDER ARTHROPLASTY Left 07/02/2013   Procedure: LEFT TOTAL SHOULDER ARTHROPLASTY;  Surgeon: Marin Shutter, MD;  Location: [place];  Service: Orthopedics;  Laterality: Left;  . TOTAL SHOULDER ARTHROPLASTY Right 04/10/2018   Procedure: TOTAL REVERSE SHOULDER ARTHROPLASTY;  Surgeon: Justice Britain, MD;  [facility];  Service: Orthopedics;  Laterality: Right;  178mn    There were no vitals filed for this visit.  Subjective Assessment - 11/06/18 1112    Subjective  Pt reports no pain today. Pt reports she went to gym since last PT session and used Nustep and did LE exercises. Pt states it takes a while for her legs to "wake up" in the morning.    Pertinent History  extensive PMH including intensive care unit stays, rehab facility stays, previously ambulated with RW.. COPD, SOB, HTN. Denies CHF, reported she has been cleared by a cardiologist.    Limitations  Walking;Lifting;Standing;Writing;House hold activities    How long can you stand comfortably?  3-4 hours    How long can you walk comfortably?  3-4 hours    Diagnostic tests  last x-rays of left  knee where in November 2016 which looked good;     Patient Stated Goals  Increase B LE muscle strength/ safety with gait    Currently in Pain?  No/denies       FOTO score 61  Therapeutic Exercise:  Nustep L7, 10 min Ambulation outside over uneven surfaces, inclines - x several minutes. Pt requires ~60-70% cuing for consistent hip flexion, heel strike and upright posture with RW Ambulationin hallway - 5x. Cuing provided for technqiue/standing closer to RW/upright posture/heel strike Standing marches with 5# ankle weights 2x20 Standing hip abduction with 5# ankle weights 2x10 Standing hip extension with 5# ankle weights 2x10 Seated marches with 5# ankle weights 2x20 Seated, LAQ 5# ankle weights  2x20    PT Education - 11/06/18 1113    Education Details  Pt  educated on technique with RW    Person(s) Educated  Patient    Methods  Explanation;Demonstration    Comprehension  Verbalized understanding;Returned demonstration       PT Short Term Goals - 08/12/18 1236      PT SHORT TERM GOAL #1   Title  Pt. will increase AAROM with ER to 30 degrees in neutral  without increase in pain to regain independent self-care tasks.     Baseline  30 deg. ER on R.      Time  4    Period  Weeks    Status  Achieved    Target Date  06/05/18        PT Long Term Goals - 11/06/18 1221      PT LONG TERM GOAL #1   Title  Pt. will increase FOTO to 56 to improve functional mobility.    Baseline  Initial FOTO: 51; FOTO 57 10/06/2018; Pt FOTO 61 11/06/2018    Time  4    Period  Weeks    Status  Achieved    Target Date  11/06/18      PT LONG TERM GOAL #2   Title  Pt. will be independent with HEP to increase B LE muscle strength 1/2 muscle grade to improve standing tolerance/ gait.    Baseline  B LE AROM: limited L ankle AROM as compared to R ankle. B LE muscle weakness: hip flexor 4/5 MMT, quads/ hamstring 4+/5 MMT, hip abduction 4+/5 MMT, hip adduction 4/5 MMT, ankle 4/5 MMT except PF 3+/5 MMT.; L/R: Hip flexion 4/5, 4+/5; Knee extension 5/5 B Knee flexion 5/5 B' DF 5/5 B 10/06/2018; Pt LE strength grossly 5/5 except R hip flexoin 4+/5 11/06/2018    Time  4    Period  Weeks    Status  Partially Met    Target Date  12/04/18      PT LONG TERM GOAL #3   Title  Pt. will demonstrate improved upright posture for >15 minutes with standing ther.ex.    Baseline  Poor standing posture/ forward head/ rounded sh; Pt maintained improved upright posture for 10 minutes before requiring cuing 10/06/2018; pt able to self correct posture without cuing ~50% of time, demonstrating improved upright posture with standing activities still within still for ~10 min 11/04/2018    Time  4    Period  Weeks    Status  Partially Met      PT LONG TERM GOAL #4   Title  Pt. able to ambulate  outside with consistent hip flexion/ heel strike and least restrictive assistive device safely.    Baseline  Pt. ambulates with RW with limited hip  flexion/ increase fall risk; Pt with improved heelstrike, but inconsistend hip flexion 10/06/2018; Pt still requires ~60-70% cuing for consistent hip flexion, heel strike and upright posture with RW 11/06/2018    Time  4    Period  Weeks    Status  Partially Met      PT LONG TERM GOAL #5   Title  Pt will ambulate with consistent reciprocal step pattern 100% of the time with minor cuing instead of step-to gait pattern with walker to improve mobility gait mechanics.    Baseline  Pt requires cuing ~80% of the time to increase step length; pt requiring cuing ~50-60% time to increase step length 11/04/2018    Time  4    Period  Weeks    Status  Partially Met            Plan - 11/06/18 1220    Clinical Impression Statement  Pt demonstrates improvement today with FOTO score of 72 (age norm 41), indicating improved functional mobility. Pt MMT grossly 5/5 in B LEs except for R hip flexion which was 4+/5. Pt still requires ~60-70% cuing for consistent hip flexion, heel strike and upright posture with RW. Pt continues to demonstrate fatigue with standing marches exercise, indicating decreased B hip flexor endurance. Pt will benefit from further skilled therapy to improve B LE strength and gait mechanics.    Personal Factors and Comorbidities  Age    Stability/Clinical Decision Making  Evolving/Moderate complexity    Clinical Decision Making  Moderate    Rehab Potential  Good    Clinical Impairments Affecting Rehab Potential  positive: motivation, family support, negative: co-morbidities, deconditioned, high fall risk; Body structures that will need to be addressed: weakness, numbness/tingling, impaired balance, difficulty walking, decreased transfer ability, etc; Due to co-morbidities her clinical presenstation is evolving;     PT Frequency  2x / week    PT  Duration  4 weeks    PT Treatment/Interventions  Cryotherapy;Electrical Stimulation;Ultrasound;Moist Heat;Gait training;Functional mobility training;Therapeutic activities;Therapeutic exercise;Neuromuscular re-education;Scar mobilization;Balance training;Stair training;Patient/family education;Manual techniques    PT Next Visit Plan  Progress gait technique and exercise for increased gait speed    PT Home Exercise Plan  See prescribed HEP.     Consulted and Agree with Plan of Care  Patient       Patient will benefit from skilled therapeutic intervention in order to improve the following deficits and impairments:  Decreased endurance, Decreased range of motion, Decreased skin integrity, Decreased strength, Hypomobility, Impaired UE functional use, Increased fascial restricitons, Pain, Postural dysfunction, Impaired flexibility, Decreased scar mobility, Decreased mobility, Decreased balance, Improper body mechanics  Visit Diagnosis: Muscle weakness (generalized)  Gait difficulty  Abnormal posture     Problem List Patient Active Problem List   Diagnosis Date Noted  . S/P reverse total shoulder arthroplasty, 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  . Chronic diastolic heart failure ([place]) 08/09/2016  . COPD (chronic obstructive pulmonary disease) ([place]) 08/09/2016  . Pulmonary infiltrates   . Pneumonia due to Streptococcus pneumoniae ([place])   . Fever   . Community acquired pneumonia of right lung   . Palliative care by specialist   . Goals of care, counseling/discussion   . Pressure injury of skin 07/03/2016  . COPD exacerbation ([place]) 07/21/2015  . Acute renal insufficiency 07/21/2015  . Hyperglycemia 07/21/2015  . Benign essential HTN 07/21/2015  . S/P shoulder replacement 07/02/2013   Pura Spice, PT, DPT # [place], SPT 11/06/2018, 12:28 PM  [place]  [facility] [address] [place], [name], [address] Phone: [phone]   Fax:   [phone]  Name: Hannah Powell MRN: [id number] Date of Birth: July 17, 1945

## 2018-11-07 ENCOUNTER — Telehealth: Payer: Self-pay | Admitting: Internal Medicine

## 2018-11-07 MED ORDER — AZITHROMYCIN 250 MG PO TABS: 250.0000 mg | ORAL_TABLET | Freq: Every day | ORAL | 0 refills | Status: DC

## 2018-11-07 NOTE — Telephone Encounter (Signed)
Rx has been sent to preferred pharmacy.  Pt is aware and voiced her understanding.  Pt has pending OV for 11/11/2018. Nothing further is needed at this time.

## 2018-11-07 NOTE — Telephone Encounter (Signed)
Called and spoke to pt, who is requesting 90 day supply of azithromycin.  I do not seen this medication mentioned in last OV note.  DK please advise.

## 2018-11-07 NOTE — Telephone Encounter (Signed)
Go ahead and prescribe She will need follow up OV as well at some point

## 2018-11-10 ENCOUNTER — Encounter: Payer: Self-pay | Admitting: Physical Therapy

## 2018-11-10 ENCOUNTER — Ambulatory Visit: Payer: Medicare HMO | Admitting: Physical Therapy

## 2018-11-10 ENCOUNTER — Other Ambulatory Visit: Payer: Self-pay

## 2018-11-10 DIAGNOSIS — R293 Abnormal posture: Secondary | ICD-10-CM

## 2018-11-10 DIAGNOSIS — R269 Unspecified abnormalities of gait and mobility: Secondary | ICD-10-CM | POA: Diagnosis not present

## 2018-11-10 DIAGNOSIS — M6281 Muscle weakness (generalized): Secondary | ICD-10-CM | POA: Diagnosis not present

## 2018-11-10 NOTE — Therapy (Signed)
[facility] [address] [place], [name], [address] Phone: [phone]   Fax:  [phone]  Physical Therapy Treatment  Patient Details  Name: Hannah Powell MRN: [id number] Date of Birth: 1945/04/16 Referring Provider (PT): Dr. Ramonita Lab III   Encounter Date: 11/10/2018  PT End of Session - 11/10/18 1557    Visit Number  18    Number of Visits  24    Date for PT Re-Evaluation  12/02/18    Authorization - Visit Number  3    Authorization - Number of Visits  10    PT Start Time  0947    PT Stop Time  1035    PT Time Calculation (min)  48 min    Equipment Utilized During Treatment  Gait belt    Activity Tolerance  Patient tolerated treatment well;Patient limited by fatigue    Behavior During Therapy  [facility] for tasks assessed/performed       Past Medical History:  Diagnosis Date  . Anxiety   . Arthritis   . CHF (congestive heart failure) ([place])   . COPD (chronic obstructive pulmonary disease) ([place])   . Edema    feet/legs  . Hypertension    dr Otho Najjar     . Hypothyroidism   . Leg weakness, bilateral   . Osteoporosis   . Oxygen deficit    2l with bipap at night  . RLS (restless legs syndrome)   . Shortness of breath   . Sleep apnea    bipap  . Wheezing     Past Surgical History:  Procedure Laterality Date  . BACK SURGERY     cervical  . BREAST BIOPSY Left    needle bx-neg  . CATARACT EXTRACTION W/PHACO Left 05/07/2017   Procedure: CATARACT EXTRACTION PHACO AND INTRAOCULAR LENS PLACEMENT (IOC);  Surgeon: Birder Robson, MD;  Location: [facility];  Service: Ophthalmology;  Laterality: Left;  [place] 00:48.7 AP% 11.3 CDE 5.46 Fluid Pack Lot # [id number]  . CATARACT EXTRACTION W/PHACO Right 04/23/2017   Procedure: CATARACT EXTRACTION PHACO AND INTRAOCULAR LENS PLACEMENT ([place]);  Surgeon: Birder Robson, MD;  Location: [facility];  Service: Ophthalmology;  Laterality: Right;  [place] 00:29 AP% 12.8 CDE 3.79 FLUID PACK LOT  # [id number]  . FOOT ARTHROPLASTY    . JOINT REPLACEMENT     x2 tkr  . TOTAL SHOULDER ARTHROPLASTY Left 07/02/2013   Procedure: LEFT TOTAL SHOULDER ARTHROPLASTY;  Surgeon: Marin Shutter, MD;  Location: [place];  Service: Orthopedics;  Laterality: Left;  . TOTAL SHOULDER ARTHROPLASTY Right 04/10/2018   Procedure: TOTAL REVERSE SHOULDER ARTHROPLASTY;  Surgeon: Justice Britain, MD;  Location: [facility];  Service: Orthopedics;  Laterality: Right;  139mn    There were no vitals filed for this visit.  Subjective Assessment - 11/10/18 1555    Subjective  Pt reports she feels fatigued today and hasn't felt well all weekend. Pt reports no pain today. Pt reports she went to gym since previous session.    Pertinent History  extensive PMH including intensive care unit stays, rehab facility stays, previously ambulated with RW.. COPD, SOB, HTN. Denies CHF, reported she has been cleared by a cardiologist.    Limitations  Walking;Lifting;Standing;Writing;House hold activities    How long can you stand comfortably?  3-4 hours    How long can you walk comfortably?  3-4 hours    Diagnostic tests  last x-rays of left knee where in November 2016 which looked good;  Patient Stated Goals  Increase B LE muscle strength/ safety with gait    Currently in Pain?  No/denies      Therapeutic Exercise:  Standing forward and lateral stepping in // bars 4x  each Seated heel raises 4#s-  2x20 Seated toe raises 4# - 2x20 Seated LAQ  4# - 22x0. Difficulty with full ROM, particularly L LE Seated adductor squeeze 10x 5 sec hold Ambulation over matt with obstacles with RW - 4x Ambulation through clinic hemiwalker - 2x step to, crouched posture, cuing for increased step length especially on L LE L5, 10 min nustep - Pt with difficultly lifting LEs onto pedals.      PT Education - 11/10/18 1556    Education Details  Pt educated on how FOF can affect balance and postural reactions; pt educated on technique with hemi walker     Person(s) Educated  Patient    Methods  Demonstration;Explanation    Comprehension  Verbalized understanding;Returned demonstration       PT Short Term Goals - 08/12/18 1236      PT SHORT TERM GOAL #1   Title  Pt. will increase AAROM with ER to 30 degrees in neutral  without increase in pain to regain independent self-care tasks.     Baseline  30 deg. ER on R.      Time  4    Period  Weeks    Status  Achieved    Target Date  06/05/18        PT Long Term Goals - 11/06/18 1221      PT LONG TERM GOAL #1   Title  Pt. will increase FOTO to 56 to improve functional mobility.    Baseline  Initial FOTO: 51; FOTO 57 10/06/2018; Pt FOTO 61 11/06/2018    Time  4    Period  Weeks    Status  Achieved    Target Date  11/06/18      PT LONG TERM GOAL #2   Title  Pt. will be independent with HEP to increase B LE muscle strength 1/2 muscle grade to improve standing tolerance/ gait.    Baseline  B LE AROM: limited L ankle AROM as compared to R ankle. B LE muscle weakness: hip flexor 4/5 MMT, quads/ hamstring 4+/5 MMT, hip abduction 4+/5 MMT, hip adduction 4/5 MMT, ankle 4/5 MMT except PF 3+/5 MMT.; L/R: Hip flexion 4/5, 4+/5; Knee extension 5/5 B Knee flexion 5/5 B' DF 5/5 B 10/06/2018; Pt LE strength grossly 5/5 except R hip flexoin 4+/5 11/06/2018    Time  4    Period  Weeks    Status  Partially Met    Target Date  12/04/18      PT LONG TERM GOAL #3   Title  Pt. will demonstrate improved upright posture for >15 minutes with standing ther.ex.    Baseline  Poor standing posture/ forward head/ rounded sh; Pt maintained improved upright posture for 10 minutes before requiring cuing 10/06/2018; pt able to self correct posture without cuing ~50% of time, demonstrating improved upright posture with standing activities still within still for ~10 min 11/04/2018    Time  4    Period  Weeks    Status  Partially Met      PT LONG TERM GOAL #4   Title  Pt. able to ambulate outside with consistent hip  flexion/ heel strike and least restrictive assistive device safely.    Baseline  Pt. ambulates with RW with limited  hip flexion/ increase fall risk; Pt with improved heelstrike, but inconsistend hip flexion 10/06/2018; Pt still requires ~60-70% cuing for consistent hip flexion, heel strike and upright posture with RW 11/06/2018    Time  4    Period  Weeks    Status  Partially Met      PT LONG TERM GOAL #5   Title  Pt will ambulate with consistent reciprocal step pattern 100% of the time with minor cuing instead of step-to gait pattern with walker to improve mobility gait mechanics.    Baseline  Pt requires cuing ~80% of the time to increase step length; pt requiring cuing ~50-60% time to increase step length 11/04/2018    Time  4    Period  Weeks    Status  Partially Met            Plan - 11/10/18 1601    Clinical Impression Statement  Pt limited with exercise in therapy today secondary to reports of increased fatigue and "feeling unwell." Pt demonstrated increased difficulty with ambulating with hemi walker, reporting FOF, and with decreased step length B but with L LE step length <R. Pt also required cuing to improve crouched posture, but had difficulty correcting for posture and step length with cues. Pt also required UE assist to get B LEs onto NuStep foot pedals, indicating decreased B LE endurance. Pt also had difficulty performing seated LAQs through full range of motion, with L LE<R, indicating decreased L LE strength compared to R.  Pt will benefit from further skilled therapy to improve LE strength and gait mechanics.    Personal Factors and Comorbidities  Age    Stability/Clinical Decision Making  Evolving/Moderate complexity    Clinical Decision Making  Moderate    Rehab Potential  Good    Clinical Impairments Affecting Rehab Potential  positive: motivation, family support, negative: co-morbidities, deconditioned, high fall risk; Body structures that will need to be addressed:  weakness, numbness/tingling, impaired balance, difficulty walking, decreased transfer ability, etc; Due to co-morbidities her clinical presenstation is evolving;     PT Frequency  2x / week    PT Duration  4 weeks    PT Treatment/Interventions  Cryotherapy;Electrical Stimulation;Ultrasound;Moist Heat;Gait training;Functional mobility training;Therapeutic activities;Therapeutic exercise;Neuromuscular re-education;Scar mobilization;Balance training;Stair training;Patient/family education;Manual techniques    PT Next Visit Plan  Progress gait technique and exercise for increased gait speed; gait and balance exercises (over obstacle course)    PT Home Exercise Plan  See prescribed HEP.     Consulted and Agree with Plan of Care  Patient       Patient will benefit from skilled therapeutic intervention in order to improve the following deficits and impairments:  Decreased endurance, Decreased range of motion, Decreased skin integrity, Decreased strength, Hypomobility, Impaired UE functional use, Increased fascial restricitons, Pain, Postural dysfunction, Impaired flexibility, Decreased scar mobility, Decreased mobility, Decreased balance, Improper body mechanics  Visit Diagnosis: Muscle weakness (generalized)  Gait difficulty  Abnormal posture     Problem List Patient Active Problem List   Diagnosis Date Noted  . S/P reverse total shoulder arthroplasty, 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  . Chronic diastolic heart failure ([place]) 08/09/2016  . COPD (chronic obstructive pulmonary disease) ([place]) 08/09/2016  . Pulmonary infiltrates   . Pneumonia due to Streptococcus pneumoniae ([place])   . Fever   . Community acquired pneumonia of right lung   . Palliative care by specialist   . Goals of care, counseling/discussion   . Pressure injury of skin 07/03/2016  . COPD exacerbation (  [place]) 07/21/2015  . Acute renal insufficiency 07/21/2015  . Hyperglycemia 07/21/2015  . Benign essential HTN 07/21/2015  . S/P shoulder  replacement 07/02/2013   Pura Spice, PT, DPT # [id number], SPT 11/10/2018, 4:04 PM   [facility] [address] [place], [name], [address] Phone: [phone]   Fax:  [phone]  Name: Hannah Powell MRN: [id number] Date of Birth: 03-Jul-1945

## 2018-11-11 ENCOUNTER — Ambulatory Visit: Payer: Medicare HMO | Admitting: Internal Medicine

## 2018-11-12 ENCOUNTER — Encounter: Payer: Self-pay | Admitting: Physical Therapy

## 2018-11-12 ENCOUNTER — Other Ambulatory Visit: Payer: Self-pay

## 2018-11-12 ENCOUNTER — Encounter: Payer: Self-pay | Admitting: Internal Medicine

## 2018-11-12 ENCOUNTER — Ambulatory Visit (INDEPENDENT_AMBULATORY_CARE_PROVIDER_SITE_OTHER): Payer: Medicare HMO | Admitting: Internal Medicine

## 2018-11-12 ENCOUNTER — Ambulatory Visit: Payer: Medicare HMO | Admitting: Physical Therapy

## 2018-11-12 DIAGNOSIS — R269 Unspecified abnormalities of gait and mobility: Secondary | ICD-10-CM | POA: Diagnosis not present

## 2018-11-12 DIAGNOSIS — M6281 Muscle weakness (generalized): Secondary | ICD-10-CM

## 2018-11-12 DIAGNOSIS — J449 Chronic obstructive pulmonary disease, unspecified: Secondary | ICD-10-CM

## 2018-11-12 DIAGNOSIS — R293 Abnormal posture: Secondary | ICD-10-CM

## 2018-11-12 DIAGNOSIS — J9612 Chronic respiratory failure with hypercapnia: Secondary | ICD-10-CM

## 2018-11-12 DIAGNOSIS — J9611 Chronic respiratory failure with hypoxia: Secondary | ICD-10-CM | POA: Diagnosis not present

## 2018-11-12 NOTE — Therapy (Signed)
[facility] [address] [place], [name], [address] Phone: [phone]   Fax:  [phone]  Physical Therapy Treatment  Patient Details  Name: Hannah Powell MRN: [id number] Date of Birth: 23-Jun-1945 Referring Provider (PT): Dr. Ramonita Lab III   Encounter Date: 11/12/2018  PT End of Session - 11/12/18 1045    Visit Number  19    Number of Visits  24    Date for PT Re-Evaluation  12/02/18    Authorization - Visit Number  4    Authorization - Number of Visits  10    Equipment Utilized During Treatment  Gait belt    Activity Tolerance  Patient tolerated treatment well;Patient limited by fatigue    Behavior During Therapy  [facility] for tasks assessed/performed       Past Medical History:  Diagnosis Date  . Anxiety   . Arthritis   . CHF (congestive heart failure) ([place])   . COPD (chronic obstructive pulmonary disease) ([place])   . Edema    feet/legs  . Hypertension    dr Otho Najjar     . Hypothyroidism   . Leg weakness, bilateral   . Osteoporosis   . Oxygen deficit    2l with bipap at night  . RLS (restless legs syndrome)   . Shortness of breath   . Sleep apnea    bipap  . Wheezing     Past Surgical History:  Procedure Laterality Date  . BACK SURGERY     cervical  . BREAST BIOPSY Left    needle bx-neg  . CATARACT EXTRACTION W/PHACO Left 05/07/2017   Procedure: CATARACT EXTRACTION PHACO AND INTRAOCULAR LENS PLACEMENT (IOC);  Surgeon: Birder Robson, MD;  Location: [facility];  Service: Ophthalmology;  Laterality: Left;  [place] 00:48.7 AP% 11.3 CDE 5.46 Fluid Pack Lot # [id number]  . CATARACT EXTRACTION W/PHACO Right 04/23/2017   Procedure: CATARACT EXTRACTION PHACO AND INTRAOCULAR LENS PLACEMENT ([place]);  Surgeon: Birder Robson, MD;  Location: [facility];  Service: Ophthalmology;  Laterality: Right;  [place] 00:29 AP% 12.8 CDE 3.79 FLUID PACK LOT # [id number]  . FOOT ARTHROPLASTY    . JOINT REPLACEMENT     x2 tkr  . TOTAL  SHOULDER ARTHROPLASTY Left 07/02/2013   Procedure: LEFT TOTAL SHOULDER ARTHROPLASTY;  Surgeon: Marin Shutter, MD;  Location: [place];  Service: Orthopedics;  Laterality: Left;  . TOTAL SHOULDER ARTHROPLASTY Right 04/10/2018   Procedure: TOTAL REVERSE SHOULDER ARTHROPLASTY;  Surgeon: Justice Britain, MD;  [facility];  Service: Orthopedics;  Laterality: Right;  149mn    There were no vitals filed for this visit.  Subjective Assessment - 11/12/18 1040    Subjective  Pt reports she has a virtual appointment with pulmonologist today. Pt reports she still feels unwell and "not like myself." Pt states she has been feeling "a little depressed" due to thinking about changes in independence and future independence. Pt reports no pain today.    Pertinent History  extensive PMH including intensive care unit stays, rehab facility stays, previously ambulated with RW.. COPD, SOB, HTN. Denies CHF, reported she has been cleared by a cardiologist.    Limitations  Walking;Lifting;Standing;Writing;House hold activities    How long can you stand comfortably?  3-4 hours    How long can you walk comfortably?  3-4 hours    Diagnostic tests  last x-rays of left knee where in November 2016 which looked good;     Patient Stated Goals  Increase B LE  muscle strength/ safety with gait    Currently in Pain?  No/denies        Therapeutic Exercise: Nustep L6, 10 min Stand>sit, emphasis eccentric component - 4x Seated hip adduction with ball 10x with 5 second isometric - Cuing for upright seated posture Standing hip abduction with 4# ankle weights - 2x10. - Cuing for technique Gait through clinic to parking lot - Cuing for technique    Neuro: Standing without UE support 2x30 sec Narrow stance without UE support 2x30 sec Semi tandem stance without UE support 2x30 sec B LEs  Alternating toe taps, progress from B to unilateral UE support - 2x10       PT Education - 11/12/18 1044    Education Details  Pt  educated on following up wtih physician regarding reported feelings of depression. Pt also educated on importance of maintaing activity level to improve independence.    Person(s) Educated  Patient    Methods  Explanation    Comprehension  Verbalized understanding       PT Short Term Goals - 08/12/18 1236      PT SHORT TERM GOAL #1   Title  Pt. will increase AAROM with ER to 30 degrees in neutral  without increase in pain to regain independent self-care tasks.     Baseline  30 deg. ER on R.      Time  4    Period  Weeks    Status  Achieved    Target Date  06/05/18        PT Long Term Goals - 11/06/18 1221      PT LONG TERM GOAL #1   Title  Pt. will increase FOTO to 56 to improve functional mobility.    Baseline  Initial FOTO: 51; FOTO 57 10/06/2018; Pt FOTO 61 11/06/2018    Time  4    Period  Weeks    Status  Achieved    Target Date  11/06/18      PT LONG TERM GOAL #2   Title  Pt. will be independent with HEP to increase B LE muscle strength 1/2 muscle grade to improve standing tolerance/ gait.    Baseline  B LE AROM: limited L ankle AROM as compared to R ankle. B LE muscle weakness: hip flexor 4/5 MMT, quads/ hamstring 4+/5 MMT, hip abduction 4+/5 MMT, hip adduction 4/5 MMT, ankle 4/5 MMT except PF 3+/5 MMT.; L/R: Hip flexion 4/5, 4+/5; Knee extension 5/5 B Knee flexion 5/5 B' DF 5/5 B 10/06/2018; Pt LE strength grossly 5/5 except R hip flexoin 4+/5 11/06/2018    Time  4    Period  Weeks    Status  Partially Met    Target Date  12/04/18      PT LONG TERM GOAL #3   Title  Pt. will demonstrate improved upright posture for >15 minutes with standing ther.ex.    Baseline  Poor standing posture/ forward head/ rounded sh; Pt maintained improved upright posture for 10 minutes before requiring cuing 10/06/2018; pt able to self correct posture without cuing ~50% of time, demonstrating improved upright posture with standing activities still within still for ~10 min 11/04/2018    Time  4     Period  Weeks    Status  Partially Met      PT LONG TERM GOAL #4   Title  Pt. able to ambulate outside with consistent hip flexion/ heel strike and least restrictive assistive device safely.    Baseline  Pt. ambulates with RW with limited hip flexion/ increase fall risk; Pt with improved heelstrike, but inconsistend hip flexion 10/06/2018; Pt still requires ~60-70% cuing for consistent hip flexion, heel strike and upright posture with RW 11/06/2018    Time  4    Period  Weeks    Status  Partially Met      PT LONG TERM GOAL #5   Title  Pt will ambulate with consistent reciprocal step pattern 100% of the time with minor cuing instead of step-to gait pattern with walker to improve mobility gait mechanics.    Baseline  Pt requires cuing ~80% of the time to increase step length; pt requiring cuing ~50-60% time to increase step length 11/04/2018    Time  4    Period  Weeks    Status  Partially Met            Plan - 11/12/18 1045    Clinical Impression Statement  Pt teary in therapy today with reports of feeling "a little depressed" due to FOF and perceived changes in independence. SPT and PT listened to pt and provided encouragement and education about pt's progress in therapy and plan to continue to improve pt's strength and balance. PT also encouraged pt to follow up with physician regarding feelings of depression and pt confirmed understanding. Pt able to perform all standing balance exercises without UE support for >30 seconds. Pt with reports of fatigue with standing hip abduction with 4# ankle weights, indicating decreased B hip abductor strength. Pt continues to require cuing for upright posture and step length for gait. Pt to continue to benefit from further skilled therapy to improve LE strength, gait mechanics and balance.    Personal Factors and Comorbidities  Age    Stability/Clinical Decision Making  Evolving/Moderate complexity    Clinical Decision Making  Moderate    Rehab  Potential  Good    Clinical Impairments Affecting Rehab Potential  positive: motivation, family support, negative: co-morbidities, deconditioned, high fall risk; Body structures that will need to be addressed: weakness, numbness/tingling, impaired balance, difficulty walking, decreased transfer ability, etc; Due to co-morbidities her clinical presenstation is evolving;     PT Frequency  2x / week    PT Duration  4 weeks    PT Treatment/Interventions  Cryotherapy;Electrical Stimulation;Ultrasound;Moist Heat;Gait training;Functional mobility training;Therapeutic activities;Therapeutic exercise;Neuromuscular re-education;Scar mobilization;Balance training;Stair training;Patient/family education;Manual techniques    PT Next Visit Plan  gait and balance exercises (over obstacle course), stairs    PT Home Exercise Plan  See prescribed HEP.     Consulted and Agree with Plan of Care  Patient       Patient will benefit from skilled therapeutic intervention in order to improve the following deficits and impairments:  Decreased endurance, Decreased range of motion, Decreased skin integrity, Decreased strength, Hypomobility, Impaired UE functional use, Increased fascial restricitons, Pain, Postural dysfunction, Impaired flexibility, Decreased scar mobility, Decreased mobility, Decreased balance, Improper body mechanics  Visit Diagnosis: Muscle weakness (generalized)  Gait difficulty  Abnormal posture     Problem List Patient Active Problem List   Diagnosis Date Noted  . S/P reverse total shoulder arthroplasty, 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  . Chronic diastolic heart failure ([place]) 08/09/2016  . COPD (chronic obstructive pulmonary disease) ([place]) 08/09/2016  . Pulmonary infiltrates   . Pneumonia due to Streptococcus pneumoniae ([place])   . Fever   . Community acquired pneumonia of right lung   . Palliative care by specialist   . Goals of care, counseling/discussion   .  Pressure injury of skin 07/03/2016  . COPD  exacerbation ([place]) 07/21/2015  . Acute renal insufficiency 07/21/2015  . Hyperglycemia 07/21/2015  . Benign essential HTN 07/21/2015  . S/P shoulder replacement 07/02/2013   Pura Spice, PT, DPT # [id number], SPT 11/12/2018, 10:55 AM  [place] [facility] [address] [place], [name], [address] Phone: [phone]   Fax:  [phone]  Name: Hannah Powell MRN: [id number] Date of Birth: September 07, 1945

## 2018-11-12 NOTE — Progress Notes (Signed)
[facility] Consultation     Date: 11/12/2018,   MRN# [id number] Hannah Powell [birth year]   I connected with the patient by video enabled telemedicine visit and verified that I am speaking with the correct person using two identifiers.    I discussed the limitations, risks, security and privacy concerns of performing an evaluation and management service by telemedicine and the availability of in-person appointments. I also discussed with the patient that there may be a patient responsible charge related to this service. The patient expressed understanding and agreed to proceed.  PATIENT AGREES AND CONFIRMS -YES   Other persons participating in the visit and their role in the encounter: Patient, nursing  This visit type was conducted due to national recommendations for restrictions regarding the COVID-19 Pandemic (e.g. social distancing).  This format is felt to be most appropriate for this patient at this time.  All issues noted in this document were discussed and addressed.        CHIEF COMPLAINT:  Follow up COPD Follow up chronic resp failure    HISTORY OF PRESENT ILLNESS    Patient with end stage lung disease with COPD requiring oxygen and biPAP therapy for survival  Remote smoker quit 1970's  FEv1 2017 52% predicted  Patient benefiting and using her BiPAP at night and tolerating well Patient also benefiting and using her oxygen at night and tolerating well  No signs of COPD exacerbation No signs of infection  On chronic Azithromycin for prevention of COPD exacerbations  On BREO only dong well         Current Outpatient Medications:  .  albuterol (PROVENTIL) (2.5 MG/3ML) 0.083% nebulizer solution, Take 2.5 mg by nebulization every 6 (six) hours as needed for wheezing or shortness of breath., Disp: , Rfl:  .  ALPRAZolam (XANAX) 0.25 MG tablet, Take 1 tablet (0.25 mg total) by mouth 3 (three) times daily as needed for anxiety., Disp: 30  tablet, Rfl: 0 .  azithromycin (ZITHROMAX) 250 MG tablet, Take 1 tablet (250 mg total) by mouth daily., Disp: 7 tablet, Rfl: 0 .  B Complex-C (B-COMPLEX WITH VITAMIN C) tablet, Take 1 tablet by mouth daily. , Disp: , Rfl:  .  Calcium Carbonate-Vitamin D (CALCIUM-VITAMIN D) 500-200 MG-UNIT per tablet, Take 1 tablet by mouth daily., Disp: , Rfl:  .  cyclobenzaprine (FLEXERIL) 10 MG tablet, Take 10 mg by mouth 3 (three) times daily as needed for muscle spasms. , Disp: , Rfl:  .  ferrous sulfate 325 (65 FE) MG tablet, Take 325 mg by mouth daily with breakfast., Disp: , Rfl:  .  fluticasone furoate-vilanterol (BREO ELLIPTA) 200-25 MCG/INH AEPB, Inhale 1 puff into the lungs daily., Disp: 60 each, Rfl: 0 .  furosemide (LASIX) 20 MG tablet, Take 2 tablets (40 mg total) by mouth 2 (two) times daily. (Patient taking differently: Take 40-80 mg by mouth See admin instructions. Take 80 mg by mouth in the morning and take 40 mg by mouth at lunch), Disp: , Rfl:  .  gabapentin (NEURONTIN) 300 MG capsule, Take 300 mg by mouth at bedtime. , Disp: , Rfl:  .  HYDROcodone-acetaminophen (NORCO) 7.5-325 MG tablet, Take 1 tablet by mouth every 6 (six) hours as needed for moderate pain., Disp: 30 tablet, Rfl: 0 .  ibuprofen (ADVIL,MOTRIN) 200 MG tablet, Take 800 mg by mouth every 6 (six) hours as needed for headache or mild pain. , Disp: , Rfl:  .  ipratropium-albuterol (DUONEB) 0.5-2.5 (3) MG/3ML SOLN, Take 3  mLs by nebulization every 4 (four) hours as needed. (Patient not taking: Reported on 03/26/2018), Disp: , Rfl:  .  levothyroxine (SYNTHROID, LEVOTHROID) 200 MCG tablet, Take 200 mcg by mouth daily before breakfast., Disp: , Rfl:  .  loratadine-pseudoephedrine (CLARITIN-D 24-HOUR) 10-240 MG 24 hr tablet, Take 1 tablet by mouth daily as needed for allergies., Disp: , Rfl:  .  losartan (COZAAR) 25 MG tablet, Take 25 mg by mouth daily., Disp: , Rfl:  .  Multiple Vitamins-Minerals (MULTIVITAMIN WOMEN PO), Take 1 tablet by  mouth daily., Disp: , Rfl:  .  Omega-3 Fatty Acids (OMEGA 3 PO), Take 1 capsule by mouth daily., Disp: , Rfl:  .  oxybutynin (DITROPAN) 5 MG tablet, Take 5 mg by mouth 3 (three) times daily., Disp: , Rfl:  .  potassium chloride SA (K-DUR,KLOR-CON) 20 MEQ tablet, Take 20 mEq by mouth 2 (two) times daily. , Disp: , Rfl:  .  pramipexole (MIRAPEX) 1 MG tablet, Take 4 mg by mouth at bedtime. , Disp: , Rfl:  .  spironolactone (ALDACTONE) 25 MG tablet, Take 25 mg by mouth 2 (two) times daily. , Disp: , Rfl:  .  topiramate (TOPAMAX) 100 MG tablet, Take 100 mg by mouth 2 (two) times daily., Disp: , Rfl:  .  traMADol (ULTRAM) 50 MG tablet, Take 1 tablet (50 mg total) by mouth every 8 (eight) hours as needed (for mild pain). (Patient not taking: Reported on 04/30/2018), Disp: 30 tablet, Rfl: 0 .  zolpidem (AMBIEN) 10 MG tablet, Take 5 mg by mouth at bedtime as needed for sleep., Disp: , Rfl:        ASSESSMENT/PLAN    moderate  COPD grade stage D  Seems to be stable Needs BIPAP therapy for survival H/o recurrent bouts of COPD exacerbation on chronic azithromycin and working well Continue inhaled steroids and LABA-BREO Albuterol as needed   chronic respiratory failure hypoxic and hypercapnia With COPD and OSA Continue oxygen and BIPAP and needs this for survival Patient uses and benefits from BiPAP therapy which reduces her admissions to the hospital    OSA and COPD Patient uses and benefits from BiPAP therapy for survival  Obesity -recommend significant weight loss -recommend changing diet  Deconditioned state -Recommend increased daily activity and exercise    COVID-19 EDUCATION: The signs and symptoms of COVID-19 were discussed with the patient and how to seek care for testing.  The importance of social distancing was discussed today. Hand Washing Techniques and avoid touching face was advised.  MEDICATION ADJUSTMENTS/LABS AND TESTS ORDERED: Continue BIPAP continue oxygen   Continue inhalers and azithromycin as prescribed   CURRENT MEDICATIONS REVIEWED AT LENGTH WITH PATIENT TODAY   Patient satisfied with Plan of action and management. All questions answered  Follow up in 6 months  Total time Spent 27 mins  Maretta Bees Patricia Pesa, M.D.  [facility]  Medical Director [place] Director [facility] Cardio-Pulmonary Department

## 2018-11-12 NOTE — Patient Instructions (Signed)
Continue BIPAP continue oxygen  Continue inhalers and azithromycin as prescribed

## 2018-11-17 ENCOUNTER — Encounter: Payer: Self-pay | Admitting: Physical Therapy

## 2018-11-17 ENCOUNTER — Other Ambulatory Visit: Payer: Self-pay

## 2018-11-17 ENCOUNTER — Ambulatory Visit: Payer: Medicare HMO | Admitting: Physical Therapy

## 2018-11-17 DIAGNOSIS — R293 Abnormal posture: Secondary | ICD-10-CM | POA: Diagnosis not present

## 2018-11-17 DIAGNOSIS — R269 Unspecified abnormalities of gait and mobility: Secondary | ICD-10-CM | POA: Diagnosis not present

## 2018-11-17 DIAGNOSIS — M6281 Muscle weakness (generalized): Secondary | ICD-10-CM | POA: Diagnosis not present

## 2018-11-17 NOTE — Therapy (Signed)
[place] [facility] [address] [place], [name], [address] Phone: [phone]   Fax:  [phone]  Physical Therapy Treatment  Patient Details  Name: Nuvia Hileman MRN: [id number] Date of Birth: Oct 26, 1945 Referring Provider (PT): Dr. Ramonita Lab III   Encounter Date: 11/17/2018  PT End of Session - 11/17/18 1343    Visit Number  20    Number of Visits  24    Date for PT Re-Evaluation  12/02/18    Authorization - Visit Number  5    Authorization - Number of Visits  10    PT Start Time  1004    PT Stop Time  1101    PT Time Calculation (min)  57 min    Equipment Utilized During Treatment  Gait belt    Activity Tolerance  Patient tolerated treatment well;Patient limited by fatigue    Behavior During Therapy  [facility] for tasks assessed/performed       Past Medical History:  Diagnosis Date  . Anxiety   . Arthritis   . CHF (congestive heart failure) ([place])   . COPD (chronic obstructive pulmonary disease) ([place])   . Edema    feet/legs  . Hypertension    dr Otho Najjar     . Hypothyroidism   . Leg weakness, bilateral   . Osteoporosis   . Oxygen deficit    2l with bipap at night  . RLS (restless legs syndrome)   . Shortness of breath   . Sleep apnea    bipap  . Wheezing     Past Surgical History:  Procedure Laterality Date  . BACK SURGERY     cervical  . BREAST BIOPSY Left    needle bx-neg  . CATARACT EXTRACTION W/PHACO Left 05/07/2017   Procedure: CATARACT EXTRACTION PHACO AND INTRAOCULAR LENS PLACEMENT (IOC);  Surgeon: Birder Robson, MD;  Location: [facility];  Service: Ophthalmology;  Laterality: Left;  [place] 00:48.7 AP% 11.3 CDE 5.46 Fluid Pack Lot # [id number]  . CATARACT EXTRACTION W/PHACO Right 04/23/2017   Procedure: CATARACT EXTRACTION PHACO AND INTRAOCULAR LENS PLACEMENT ([place]);  Surgeon: Birder Robson, MD;  Location: [facility] OR[facility];  Service: Ophthalmology;  Laterality: Right;  [place] 00:29 AP% 12.8 CDE 3.79 FLUID PACK LOT  # [id number]  . FOOT ARTHROPLASTY    . JOINT REPLACEMENT     x2 tkr  . TOTAL SHOULDER ARTHROPLASTY Left 07/02/2013   Procedure: LEFT TOTAL SHOULDER ARTHROPLASTY;  Surgeon: Marin Shutter, MD;  Location: [facility];  Service: Orthopedics;  Laterality: Left;  . TOTAL SHOULDER ARTHROPLASTY Right 04/10/2018   Procedure: TOTAL REVERSE SHOULDER ARTHROPLASTY;  Surgeon: Justice Britain, MD;  Location: [facility];  Service: Orthopedics;  Laterality: Right;  125mn    There were no vitals filed for this visit.  Subjective Assessment - 11/17/18 1403    Subjective  Pt reports she still feels increased fatigue but that it has improved since previous session. Pt reports she went to gym this weekend. Pt reports she feels her tolerance for walking has been decreased lately. Pt reports no pain.    Pertinent History  extensive PMH including intensive care unit stays, rehab facility stays, previously ambulated with RW.. COPD, SOB, HTN. Denies CHF, reported she has been cleared by a cardiologist.    Limitations  Walking;Lifting;Standing;Writing;House hold activities    How long can you stand comfortably?  3-4 hours    How long can you walk comfortably?  3-4 hours    Diagnostic tests  last x-rays  of left knee where in November 2016 which looked good;     Patient Stated Goals  Increase B LE muscle strength/ safety with gait    Currently in Pain?  No/denies       Seated BP 114/54,  HR 82 SPO2% with activit 94-95%m at rest 96%   Therapeutic Exercise: Ambulation with hemi walker around clinic - 2x. Max cuing provided for technique. Ambulation around clinic and over obstacles with RW - Cuing provided for technique - 4x Ambulation through clinic and parking lot to car, over curb, with RW - pt with decreased gait speed. Stand>sit with focus on eccentric control - 4x.  Standing marches in // bars - x multiple reps Standing hip abduction  Standing hamstring curls - x multiple reps Nustep L3, 10 min    PT Education -  11/17/18 1342    Education Details  Pt educated on importance of upright posture for maintaining balance with gait and exercises    Person(s) Educated  Patient    Methods  Explanation;Demonstration    Comprehension  Verbalized understanding;Returned demonstration       PT Short Term Goals - 08/12/18 1236      PT SHORT TERM GOAL #1   Title  Pt. will increase AAROM with ER to 30 degrees in neutral  without increase in pain to regain independent self-care tasks.     Baseline  30 deg. ER on R.      Time  4    Period  Weeks    Status  Achieved    Target Date  06/05/18        PT Long Term Goals - 11/06/18 1221      PT LONG TERM GOAL #1   Title  Pt. will increase FOTO to 56 to improve functional mobility.    Baseline  Initial FOTO: 51; FOTO 57 10/06/2018; Pt FOTO 61 11/06/2018    Time  4    Period  Weeks    Status  Achieved    Target Date  11/06/18      PT LONG TERM GOAL #2   Title  Pt. will be independent with HEP to increase B LE muscle strength 1/2 muscle grade to improve standing tolerance/ gait.    Baseline  B LE AROM: limited L ankle AROM as compared to R ankle. B LE muscle weakness: hip flexor 4/5 MMT, quads/ hamstring 4+/5 MMT, hip abduction 4+/5 MMT, hip adduction 4/5 MMT, ankle 4/5 MMT except PF 3+/5 MMT.; L/R: Hip flexion 4/5, 4+/5; Knee extension 5/5 B Knee flexion 5/5 B' DF 5/5 B 10/06/2018; Pt LE strength grossly 5/5 except R hip flexoin 4+/5 11/06/2018    Time  4    Period  Weeks    Status  Partially Met    Target Date  12/04/18      PT LONG TERM GOAL #3   Title  Pt. will demonstrate improved upright posture for >15 minutes with standing ther.ex.    Baseline  Poor standing posture/ forward head/ rounded sh; Pt maintained improved upright posture for 10 minutes before requiring cuing 10/06/2018; pt able to self correct posture without cuing ~50% of time, demonstrating improved upright posture with standing activities still within still for ~10 min 11/04/2018    Time  4     Period  Weeks    Status  Partially Met      PT LONG TERM GOAL #4   Title  Pt. able to ambulate outside with consistent hip flexion/  heel strike and least restrictive assistive device safely.    Baseline  Pt. ambulates with RW with limited hip flexion/ increase fall risk; Pt with improved heelstrike, but inconsistend hip flexion 10/06/2018; Pt still requires ~60-70% cuing for consistent hip flexion, heel strike and upright posture with RW 11/06/2018    Time  4    Period  Weeks    Status  Partially Met      PT LONG TERM GOAL #5   Title  Pt will ambulate with consistent reciprocal step pattern 100% of the time with minor cuing instead of step-to gait pattern with walker to improve mobility gait mechanics.    Baseline  Pt requires cuing ~80% of the time to increase step length; pt requiring cuing ~50-60% time to increase step length 11/04/2018    Time  4    Period  Weeks    Status  Partially Met            Plan - 11/17/18 1402    Clinical Impression Statement  Pt presents to therapy with continued reports of fatigue and FOF and was limited with number of exercises performed today secondary to fatigue, requiring prolonged seated rest breaks with gait exercises. However, pt was able to increase ambulation with hemi walker to two laps around clinic. Pt still required max cuing to maintain upright posture and increase L LE step length with all gait exercises and demonstrated decreased gait speed. Pt also with decreased L hip flexion with gait compared to R LE. Pt able to ambulate over obstacles and uneven surfaces with RW but declined attempting to with hemiwalker secondary to FOF. Pt will continue to benefit from further skilled therapy to increase LE strength and endurance for gait and to improve ease with ADLs.    Personal Factors and Comorbidities  Age    Stability/Clinical Decision Making  Evolving/Moderate complexity    Clinical Decision Making  Moderate    Rehab Potential  Good    Clinical  Impairments Affecting Rehab Potential  positive: motivation, family support, negative: co-morbidities, deconditioned, high fall risk; Body structures that will need to be addressed: weakness, numbness/tingling, impaired balance, difficulty walking, decreased transfer ability, etc; Due to co-morbidities her clinical presenstation is evolving;     PT Frequency  2x / week    PT Duration  4 weeks    PT Treatment/Interventions  Cryotherapy;Electrical Stimulation;Ultrasound;Moist Heat;Gait training;Functional mobility training;Therapeutic activities;Therapeutic exercise;Neuromuscular re-education;Scar mobilization;Balance training;Stair training;Patient/family education;Manual techniques    PT Next Visit Plan  gait and balance exercises (over obstacle course), stairs; reassess goals    PT Home Exercise Plan  See prescribed HEP.     Consulted and Agree with Plan of Care  Patient       Patient will benefit from skilled therapeutic intervention in order to improve the following deficits and impairments:  Decreased endurance, Decreased range of motion, Decreased skin integrity, Decreased strength, Hypomobility, Impaired UE functional use, Increased fascial restricitons, Pain, Postural dysfunction, Impaired flexibility, Decreased scar mobility, Decreased mobility, Decreased balance, Improper body mechanics, Abnormal gait  Visit Diagnosis: Muscle weakness (generalized)  Gait difficulty  Abnormal posture     Problem List Patient Active Problem List   Diagnosis Date Noted  . S/P reverse total shoulder arthroplasty, 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  . Chronic diastolic heart failure ([place]) 08/09/2016  . COPD (chronic obstructive pulmonary disease) ([place]) 08/09/2016  . Pulmonary infiltrates   . Pneumonia due to Streptococcus pneumoniae ([place])   . Fever   . Community acquired pneumonia of right lung   .  Palliative care by specialist   . Goals of care, counseling/discussion   . Pressure injury of skin 07/03/2016  .  COPD exacerbation ([place]) 07/21/2015  . Acute renal insufficiency 07/21/2015  . Hyperglycemia 07/21/2015  . Benign essential HTN 07/21/2015  . S/P shoulder replacement 07/02/2013   Pura Spice, PT, DPT # [id number], SPT 11/17/2018, 2:04 PM  [facility] [address] [place], [name], [address] Phone: [phone]   Fax:  [phone]  Name: Alieah Brinton MRN: [id number] Date of Birth: 28-Jan-1946

## 2018-11-19 ENCOUNTER — Encounter: Payer: Self-pay | Admitting: Physical Therapy

## 2018-11-19 ENCOUNTER — Ambulatory Visit: Payer: Medicare HMO | Attending: Internal Medicine | Admitting: Physical Therapy

## 2018-11-19 ENCOUNTER — Other Ambulatory Visit: Payer: Self-pay

## 2018-11-19 DIAGNOSIS — M6281 Muscle weakness (generalized): Secondary | ICD-10-CM | POA: Insufficient documentation

## 2018-11-19 DIAGNOSIS — R293 Abnormal posture: Secondary | ICD-10-CM | POA: Diagnosis not present

## 2018-11-19 DIAGNOSIS — R269 Unspecified abnormalities of gait and mobility: Secondary | ICD-10-CM

## 2018-11-19 DIAGNOSIS — Z96611 Presence of right artificial shoulder joint: Secondary | ICD-10-CM | POA: Diagnosis not present

## 2018-11-19 NOTE — Therapy (Signed)
[place] [facility] [address] [place], [name], [address] Phone: [phone]   Fax:  [phone]  Physical Therapy Treatment  Patient Details  Name: Hannah Powell MRN: [id number] Date of Birth: 06/06/1945 Referring Provider (PT): Dr. Ramonita Lab III   Encounter Date: 11/19/2018  PT End of Session - 11/19/18 1322    Visit Number  21    Number of Visits  24    Date for PT Re-Evaluation  12/02/18    Authorization - Visit Number  6    Authorization - Number of Visits  10    PT Start Time  0947    PT Stop Time  1036    PT Time Calculation (min)  49 min    Equipment Utilized During Treatment  --    Activity Tolerance  Patient tolerated treatment well    Behavior During Therapy  [facility] for tasks assessed/performed       Past Medical History:  Diagnosis Date  . Anxiety   . Arthritis   . CHF (congestive heart failure) ([place])   . COPD (chronic obstructive pulmonary disease) ([place])   . Edema    feet/legs  . Hypertension    dr Otho Najjar     . Hypothyroidism   . Leg weakness, bilateral   . Osteoporosis   . Oxygen deficit    2l with bipap at night  . RLS (restless legs syndrome)   . Shortness of breath   . Sleep apnea    bipap  . Wheezing     Past Surgical History:  Procedure Laterality Date  . BACK SURGERY     cervical  . BREAST BIOPSY Left    needle bx-neg  . CATARACT EXTRACTION W/PHACO Left 05/07/2017   Procedure: CATARACT EXTRACTION PHACO AND INTRAOCULAR LENS PLACEMENT (IOC);  Surgeon: Birder Robson, MD;  Location: [facility];  Service: Ophthalmology;  Laterality: Left;  [place] 00:48.7 AP% 11.3 CDE 5.46 Fluid Pack Lot # [id number]  . CATARACT EXTRACTION W/PHACO Right 04/23/2017   Procedure: CATARACT EXTRACTION PHACO AND INTRAOCULAR LENS PLACEMENT ([place]);  Surgeon: Birder Robson, MD;  Location: [facility];  Service: Ophthalmology;  Laterality: Right;  [place] 00:29 AP% 12.8 CDE 3.79 FLUID PACK LOT # [id number] H  . FOOT ARTHROPLASTY     . JOINT REPLACEMENT     x2 tkr  . TOTAL SHOULDER ARTHROPLASTY Left 07/02/2013   Procedure: LEFT TOTAL SHOULDER ARTHROPLASTY;  Surgeon: Marin Shutter, MD;  Location: [place];  Service: Orthopedics;  Laterality: Left;  . TOTAL SHOULDER ARTHROPLASTY Right 04/10/2018   Procedure: TOTAL REVERSE SHOULDER ARTHROPLASTY;  Surgeon: Justice Britain, MD;  Location: [facility];  Service: Orthopedics;  Laterality: Right;  118mn    There were no vitals filed for this visit.  Subjective Assessment - 11/19/18 1320    Subjective  Pt reports feeling better this session compared to previous session. Pt reports she went to gym yesterday and performed LE exercises. Pt reports she is considering counseling.    Pertinent History  extensive PMH including intensive care unit stays, rehab facility stays, previously ambulated with RW.. COPD, SOB, HTN. Denies CHF, reported she has been cleared by a cardiologist.    Limitations  Walking;Lifting;Standing;Writing;House hold activities    How long can you stand comfortably?  3-4 hours    How long can you walk comfortably?  3-4 hours    Diagnostic tests  last x-rays of left knee where in November 2016 which looked good;     Patient Stated  Goals  Increase B LE muscle strength/ safety with gait    Currently in Pain?  No/denies        Therapeutic exercise:  Ambulating outside with RW over grass, inclines and curbs through parking lot x 5 min. Ambulating from sidewalk to and around clinic with hemiwalker x 8 minutes. Seated marching with 4# ankle weights 2x20 Seated LAQ with 4# ankle weights 2x15 B LEs Seated heel and toes raises with 4# ankle weights 2x20 B LEs. Supine pec stretch 4x30 sec Supine pec stretch with marching 20x Supine pec stretch with SLR - 10x B LEs Open book - 10x B UEs Nustep, L3 10 min     PT Education - 11/19/18 1321    Education Details  Pt educated on technique with hemiwalker/increasing step length and scanning environment instead of looking at  feet while ambulating    Person(s) Educated  Patient    Methods  Explanation;Demonstration    Comprehension  Returned demonstration;Verbalized understanding       PT Short Term Goals - 08/12/18 1236      PT SHORT TERM GOAL #1   Title  Pt. will increase AAROM with ER to 30 degrees in neutral  without increase in pain to regain independent self-care tasks.     Baseline  30 deg. ER on R.      Time  4    Period  Weeks    Status  Achieved    Target Date  06/05/18        PT Long Term Goals - 11/06/18 1221      PT LONG TERM GOAL #1   Title  Pt. will increase FOTO to 56 to improve functional mobility.    Baseline  Initial FOTO: 51; FOTO 57 10/06/2018; Pt FOTO 61 11/06/2018    Time  4    Period  Weeks    Status  Achieved    Target Date  11/06/18      PT LONG TERM GOAL #2   Title  Pt. will be independent with HEP to increase B LE muscle strength 1/2 muscle grade to improve standing tolerance/ gait.    Baseline  B LE AROM: limited L ankle AROM as compared to R ankle. B LE muscle weakness: hip flexor 4/5 MMT, quads/ hamstring 4+/5 MMT, hip abduction 4+/5 MMT, hip adduction 4/5 MMT, ankle 4/5 MMT except PF 3+/5 MMT.; L/R: Hip flexion 4/5, 4+/5; Knee extension 5/5 B Knee flexion 5/5 B' DF 5/5 B 10/06/2018; Pt LE strength grossly 5/5 except R hip flexoin 4+/5 11/06/2018    Time  4    Period  Weeks    Status  Partially Met    Target Date  12/04/18      PT LONG TERM GOAL #3   Title  Pt. will demonstrate improved upright posture for >15 minutes with standing ther.ex.    Baseline  Poor standing posture/ forward head/ rounded sh; Pt maintained improved upright posture for 10 minutes before requiring cuing 10/06/2018; pt able to self correct posture without cuing ~50% of time, demonstrating improved upright posture with standing activities still within still for ~10 min 11/04/2018    Time  4    Period  Weeks    Status  Partially Met      PT LONG TERM GOAL #4   Title  Pt. able to ambulate outside  with consistent hip flexion/ heel strike and least restrictive assistive device safely.    Baseline  Pt. ambulates with RW with  limited hip flexion/ increase fall risk; Pt with improved heelstrike, but inconsistend hip flexion 10/06/2018; Pt still requires ~60-70% cuing for consistent hip flexion, heel strike and upright posture with RW 11/06/2018    Time  4    Period  Weeks    Status  Partially Met      PT LONG TERM GOAL #5   Title  Pt will ambulate with consistent reciprocal step pattern 100% of the time with minor cuing instead of step-to gait pattern with walker to improve mobility gait mechanics.    Baseline  Pt requires cuing ~80% of the time to increase step length; pt requiring cuing ~50-60% time to increase step length 11/04/2018    Time  4    Period  Weeks    Status  Partially Met            Plan - 11/19/18 1343    Clinical Impression Statement  Pt demonstrates improvement with ambulating with hemiwalker for an increased amount of time today compared to previous session, indicating increased LE endurance. However, pt with notably decreased gait speed compared to previous sessions with both RW and hemiwalker, and with increased time to complete sit<>stands. SPT discussed/educated pt on benefits of following up with counselor regarding psychological support for pt's concerns around independence with ADLs and community participation. Pt confirmed understanding and reported interested in pursuing counseling. Pt will benefit from further skilled therapy to improve LE strength and gait speed/mechanics.    Personal Factors and Comorbidities  Age    Stability/Clinical Decision Making  Evolving/Moderate complexity    Rehab Potential  Good    Clinical Impairments Affecting Rehab Potential  positive: motivation, family support, negative: co-morbidities, deconditioned, high fall risk; Body structures that will need to be addressed: weakness, numbness/tingling, impaired balance, difficulty  walking, decreased transfer ability, etc; Due to co-morbidities her clinical presenstation is evolving;     PT Frequency  2x / week    PT Duration  4 weeks    PT Treatment/Interventions  Cryotherapy;Electrical Stimulation;Ultrasound;Moist Heat;Gait training;Functional mobility training;Therapeutic activities;Therapeutic exercise;Neuromuscular re-education;Scar mobilization;Balance training;Stair training;Patient/family education;Manual techniques    PT Next Visit Plan  progress endurance with gait using LRD    PT Home Exercise Plan  See prescribed HEP.     Consulted and Agree with Plan of Care  Patient       Patient will benefit from skilled therapeutic intervention in order to improve the following deficits and impairments:  Decreased endurance, Decreased range of motion, Decreased skin integrity, Decreased strength, Hypomobility, Impaired UE functional use, Increased fascial restricitons, Pain, Postural dysfunction, Impaired flexibility, Decreased scar mobility, Decreased mobility, Decreased balance, Improper body mechanics, Abnormal gait  Visit Diagnosis: Muscle weakness (generalized)  Gait difficulty  Abnormal posture     Problem List Patient Active Problem List   Diagnosis Date Noted  . S/P reverse total shoulder arthroplasty, 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  . Chronic diastolic heart failure ([place]) 08/09/2016  . COPD (chronic obstructive pulmonary disease) ([place]) 08/09/2016  . Pulmonary infiltrates   . Pneumonia due to Streptococcus pneumoniae ([place])   . Fever   . Community acquired pneumonia of right lung   . Palliative care by specialist   . Goals of care, counseling/discussion   . Pressure injury of skin 07/03/2016  . COPD exacerbation ([place]) 07/21/2015  . Acute renal insufficiency 07/21/2015  . Hyperglycemia 07/21/2015  . Benign essential HTN 07/21/2015  . S/P shoulder replacement 07/02/2013   Pura Spice, PT, DPT # [id number] Ricard Dillon, SPT 11/19/2018, 1:45 PM  [facility] [address] [place], [name], [address] Phone: [phone]   Fax:  [phone]  Name: Shera Laubach MRN: [id number] Date of Birth: 08/30/1945

## 2018-11-25 ENCOUNTER — Other Ambulatory Visit: Payer: Self-pay

## 2018-11-25 ENCOUNTER — Encounter: Payer: Self-pay | Admitting: Physical Therapy

## 2018-11-25 ENCOUNTER — Ambulatory Visit: Payer: Medicare HMO | Admitting: Physical Therapy

## 2018-11-25 DIAGNOSIS — M6281 Muscle weakness (generalized): Secondary | ICD-10-CM

## 2018-11-25 DIAGNOSIS — E034 Atrophy of thyroid (acquired): Secondary | ICD-10-CM | POA: Diagnosis not present

## 2018-11-25 DIAGNOSIS — I10 Essential (primary) hypertension: Secondary | ICD-10-CM | POA: Diagnosis not present

## 2018-11-25 DIAGNOSIS — G4733 Obstructive sleep apnea (adult) (pediatric): Secondary | ICD-10-CM | POA: Diagnosis not present

## 2018-11-25 DIAGNOSIS — Z79899 Other long term (current) drug therapy: Secondary | ICD-10-CM | POA: Diagnosis not present

## 2018-11-25 DIAGNOSIS — M48062 Spinal stenosis, lumbar region with neurogenic claudication: Secondary | ICD-10-CM | POA: Diagnosis not present

## 2018-11-25 DIAGNOSIS — J9611 Chronic respiratory failure with hypoxia: Secondary | ICD-10-CM | POA: Diagnosis not present

## 2018-11-25 DIAGNOSIS — E7800 Pure hypercholesterolemia, unspecified: Secondary | ICD-10-CM | POA: Diagnosis not present

## 2018-11-25 DIAGNOSIS — R293 Abnormal posture: Secondary | ICD-10-CM | POA: Diagnosis not present

## 2018-11-25 DIAGNOSIS — R269 Unspecified abnormalities of gait and mobility: Secondary | ICD-10-CM

## 2018-11-25 DIAGNOSIS — Z96611 Presence of right artificial shoulder joint: Secondary | ICD-10-CM | POA: Diagnosis not present

## 2018-11-25 NOTE — Therapy (Signed)
[place] [facility] [address] [place], [name], [address] Phone: [phone]   Fax:  [phone]  Physical Therapy Treatment  Patient Details  Name: Hannah Powell MRN: [id number] Date of Birth: 03/01/1946 Referring Provider (PT): Dr. Ramonita Lab III   Encounter Date: 11/25/2018  PT End of Session - 11/25/18 1411    Visit Number  22    Number of Visits  24    Date for PT Re-Evaluation  12/02/18    Authorization - Visit Number  7    Authorization - Number of Visits  10    PT Start Time  1026    PT Stop Time  1113    PT Time Calculation (min)  47 min    Equipment Utilized During Treatment  Gait belt    Activity Tolerance  Patient tolerated treatment well    Behavior During Therapy  [facility] for tasks assessed/performed       Past Medical History:  Diagnosis Date  . Anxiety   . Arthritis   . CHF (congestive heart failure) ([place])   . COPD (chronic obstructive pulmonary disease) ([place])   . Edema    feet/legs  . Hypertension    dr Otho Najjar     . Hypothyroidism   . Leg weakness, bilateral   . Osteoporosis   . Oxygen deficit    2l with bipap at night  . RLS (restless legs syndrome)   . Shortness of breath   . Sleep apnea    bipap  . Wheezing     Past Surgical History:  Procedure Laterality Date  . BACK SURGERY     cervical  . BREAST BIOPSY Left    needle bx-neg  . CATARACT EXTRACTION W/PHACO Left 05/07/2017   Procedure: CATARACT EXTRACTION PHACO AND INTRAOCULAR LENS PLACEMENT (IOC);  Surgeon: Birder Robson, MD;  Location: [facility];  Service: Ophthalmology;  Laterality: Left;  [place] 00:48.7 AP% 11.3 CDE 5.46 Fluid Pack Lot # [id number]  . CATARACT EXTRACTION W/PHACO Right 04/23/2017   Procedure: CATARACT EXTRACTION PHACO AND INTRAOCULAR LENS PLACEMENT ([place]);  Surgeon: Birder Robson, MD;  Location: [facility];  Service: Ophthalmology;  Laterality: Right;  [place] 00:29 AP% 12.8 CDE 3.79 FLUID PACK LOT # [id number] H  . FOOT  ARTHROPLASTY    . JOINT REPLACEMENT     x2 tkr  . TOTAL SHOULDER ARTHROPLASTY Left 07/02/2013   Procedure: LEFT TOTAL SHOULDER ARTHROPLASTY;  Surgeon: Marin Shutter, MD;  Location: [place];  Service: Orthopedics;  Laterality: Left;  . TOTAL SHOULDER ARTHROPLASTY Right 04/10/2018   Procedure: TOTAL REVERSE SHOULDER ARTHROPLASTY;  Surgeon: Justice Britain, MD;  Location: [facility];  Service: Orthopedics;  Laterality: Right;  167mn    There were no vitals filed for this visit.  Subjective Assessment - 11/25/18 1037    Subjective  Pt reports pain is 0/10 today, but reports had pain last night from toothand disrupted her sleep.    Pertinent History  extensive PMH including intensive care unit stays, rehab facility stays, previously ambulated with RW.. COPD, SOB, HTN. Denies CHF, reported she has been cleared by a cardiologist.    Limitations  Walking;Lifting;Standing;Writing;House hold activities    How long can you stand comfortably?  3-4 hours    How long can you walk comfortably?  3-4 hours    Diagnostic tests  last x-rays of left knee where in November 2016 which looked good;     Patient Stated Goals  Increase B LE muscle strength/ safety with  gait        Therapeutic Exercises Nustep, L4, 10 min Seated marches with 5# ankle weights - 2x20 Seated LAQ with 5# ankle weights - 2x20 Standing hip abduction with 5# ankle weights. Cuing for upright posture Standing RTB rows - 2x20 Cuing for technique Standing RTB ER 2x20 Cuing for technique  Neuro: Standing narrow stance 2x30 sec  Standing semi tandem - 2x30 sec B LEs- Progress from B to intermittent UE suport Cone taps B LEs - x multiple repetitions. Difficulty clearing cones on L LE. Seated heel raises - 2x20 Seated core twists with RTB B directions - x multiple reps    PT Short Term Goals - 08/12/18 1236      PT SHORT TERM GOAL #1   Title  Pt. will increase AAROM with ER to 30 degrees in neutral  without increase in pain to regain  independent self-care tasks.     Baseline  30 deg. ER on R.      Time  4    Period  Weeks    Status  Achieved    Target Date  06/05/18        PT Long Term Goals - 11/06/18 1221      PT LONG TERM GOAL #1   Title  Pt. will increase FOTO to 56 to improve functional mobility.    Baseline  Initial FOTO: 51; FOTO 57 10/06/2018; Pt FOTO 61 11/06/2018    Time  4    Period  Weeks    Status  Achieved    Target Date  11/06/18      PT LONG TERM GOAL #2   Title  Pt. will be independent with HEP to increase B LE muscle strength 1/2 muscle grade to improve standing tolerance/ gait.    Baseline  B LE AROM: limited L ankle AROM as compared to R ankle. B LE muscle weakness: hip flexor 4/5 MMT, quads/ hamstring 4+/5 MMT, hip abduction 4+/5 MMT, hip adduction 4/5 MMT, ankle 4/5 MMT except PF 3+/5 MMT.; L/R: Hip flexion 4/5, 4+/5; Knee extension 5/5 B Knee flexion 5/5 B' DF 5/5 B 10/06/2018; Pt LE strength grossly 5/5 except R hip flexoin 4+/5 11/06/2018    Time  4    Period  Weeks    Status  Partially Met    Target Date  12/04/18      PT LONG TERM GOAL #3   Title  Pt. will demonstrate improved upright posture for >15 minutes with standing ther.ex.    Baseline  Poor standing posture/ forward head/ rounded sh; Pt maintained improved upright posture for 10 minutes before requiring cuing 10/06/2018; pt able to self correct posture without cuing ~50% of time, demonstrating improved upright posture with standing activities still within still for ~10 min 11/04/2018    Time  4    Period  Weeks    Status  Partially Met      PT LONG TERM GOAL #4   Title  Pt. able to ambulate outside with consistent hip flexion/ heel strike and least restrictive assistive device safely.    Baseline  Pt. ambulates with RW with limited hip flexion/ increase fall risk; Pt with improved heelstrike, but inconsistend hip flexion 10/06/2018; Pt still requires ~60-70% cuing for consistent hip flexion, heel strike and upright posture with RW  11/06/2018    Time  4    Period  Weeks    Status  Partially Met      PT LONG TERM GOAL #5  Title  Pt will ambulate with consistent reciprocal step pattern 100% of the time with minor cuing instead of step-to gait pattern with walker to improve mobility gait mechanics.    Baseline  Pt requires cuing ~80% of the time to increase step length; pt requiring cuing ~50-60% time to increase step length 11/04/2018    Time  4    Period  Weeks    Status  Partially Met            Plan - 11/25/18 1246    Clinical Impression Statement  Pt demonstrates difficulty with cone taps with decreased hip flexion on L LE and difficulty tapping clearing top of cone. Pt also unable to perform exercise with corrected posture due to FOFs. Pt with improved static balance and was able to maintain semi tandem for 30 seconds without UE support.  Pt also continues to require cuing for upright posture with standing exercises. Pt will continue to benefit from further skilled therapy to improve balance and LE strength.    Personal Factors and Comorbidities  Age    Stability/Clinical Decision Making  Evolving/Moderate complexity    Clinical Decision Making  Moderate    Rehab Potential  Good    Clinical Impairments Affecting Rehab Potential  positive: motivation, family support, negative: co-morbidities, deconditioned, high fall risk; Body structures that will need to be addressed: weakness, numbness/tingling, impaired balance, difficulty walking, decreased transfer ability, etc; Due to co-morbidities her clinical presenstation is evolving;     PT Frequency  2x / week    PT Duration  4 weeks    PT Treatment/Interventions  Cryotherapy;Electrical Stimulation;Ultrasound;Moist Heat;Gait training;Functional mobility training;Therapeutic activities;Therapeutic exercise;Neuromuscular re-education;Scar mobilization;Balance training;Stair training;Patient/family education;Manual techniques    PT Next Visit Plan  progress endurance  with gait using LRD; progress balance exercises    PT Home Exercise Plan  See prescribed HEP.     Consulted and Agree with Plan of Care  Patient       Patient will benefit from skilled therapeutic intervention in order to improve the following deficits and impairments:  Decreased endurance, Decreased range of motion, Decreased skin integrity, Decreased strength, Hypomobility, Impaired UE functional use, Increased fascial restricitons, Pain, Postural dysfunction, Impaired flexibility, Decreased scar mobility, Decreased mobility, Decreased balance, Improper body mechanics, Abnormal gait  Visit Diagnosis: Muscle weakness (generalized)  Gait difficulty  Abnormal posture     Problem List Patient Active Problem List   Diagnosis Date Noted  . S/P reverse total shoulder arthroplasty, 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  . Chronic diastolic heart failure ([place]) 08/09/2016  . COPD (chronic obstructive pulmonary disease) ([place]) 08/09/2016  . Pulmonary infiltrates   . Pneumonia due to Streptococcus pneumoniae ([place])   . Fever   . Community acquired pneumonia of right lung   . Palliative care by specialist   . Goals of care, counseling/discussion   . Pressure injury of skin 07/03/2016  . COPD exacerbation ([place]) 07/21/2015  . Acute renal insufficiency 07/21/2015  . Hyperglycemia 07/21/2015  . Benign essential HTN 07/21/2015  . S/P shoulder replacement 07/02/2013   Pura Spice, PT, DPT # [id number] SPT 11/25/2018, 2:47 PM  [facility] [address] [place], [name], [address] Phone: [phone]   Fax:  [phone]  Name: Hannah Powell MRN: [id number] Date of Birth: 1946-01-23

## 2018-11-26 DIAGNOSIS — Z20828 Contact with and (suspected) exposure to other viral communicable diseases: Secondary | ICD-10-CM | POA: Diagnosis not present

## 2018-11-27 ENCOUNTER — Encounter: Payer: Medicare HMO | Admitting: Physical Therapy

## 2018-12-01 DIAGNOSIS — M48062 Spinal stenosis, lumbar region with neurogenic claudication: Secondary | ICD-10-CM | POA: Diagnosis not present

## 2018-12-01 DIAGNOSIS — I5032 Chronic diastolic (congestive) heart failure: Secondary | ICD-10-CM | POA: Diagnosis not present

## 2018-12-01 DIAGNOSIS — E034 Atrophy of thyroid (acquired): Secondary | ICD-10-CM | POA: Diagnosis not present

## 2018-12-01 DIAGNOSIS — Z79899 Other long term (current) drug therapy: Secondary | ICD-10-CM | POA: Diagnosis not present

## 2018-12-01 DIAGNOSIS — J9611 Chronic respiratory failure with hypoxia: Secondary | ICD-10-CM | POA: Diagnosis not present

## 2018-12-01 DIAGNOSIS — G63 Polyneuropathy in diseases classified elsewhere: Secondary | ICD-10-CM | POA: Diagnosis not present

## 2018-12-01 DIAGNOSIS — I130 Hypertensive heart and chronic kidney disease with heart failure and stage 1 through stage 4 chronic kidney disease, or unspecified chronic kidney disease: Secondary | ICD-10-CM | POA: Diagnosis not present

## 2018-12-01 DIAGNOSIS — E7800 Pure hypercholesterolemia, unspecified: Secondary | ICD-10-CM | POA: Diagnosis not present

## 2018-12-01 DIAGNOSIS — J438 Other emphysema: Secondary | ICD-10-CM | POA: Diagnosis not present

## 2018-12-02 ENCOUNTER — Ambulatory Visit: Payer: Medicare HMO | Admitting: Physical Therapy

## 2018-12-02 ENCOUNTER — Encounter: Payer: Self-pay | Admitting: Physical Therapy

## 2018-12-02 ENCOUNTER — Other Ambulatory Visit: Payer: Self-pay

## 2018-12-02 DIAGNOSIS — R269 Unspecified abnormalities of gait and mobility: Secondary | ICD-10-CM

## 2018-12-02 DIAGNOSIS — M6281 Muscle weakness (generalized): Secondary | ICD-10-CM | POA: Diagnosis not present

## 2018-12-02 DIAGNOSIS — Z96611 Presence of right artificial shoulder joint: Secondary | ICD-10-CM

## 2018-12-02 DIAGNOSIS — R293 Abnormal posture: Secondary | ICD-10-CM | POA: Diagnosis not present

## 2018-12-02 NOTE — Therapy (Deleted)
[facility] [address] [place], [name], [address] Phone: [phone]   Fax:  [phone]  Physical Therapy Treatment  Patient Details  Name: Hannah Powell MRN: [id number] Date of Birth: 1946-03-05 Referring Provider (PT): Dr. Ramonita Lab III   Encounter Date: 12/02/2018  PT End of Session - 12/02/18 1232    Visit Number  23    Number of Visits  24    Date for PT Re-Evaluation  12/02/18    Authorization - Visit Number  8    Authorization - Number of Visits  10    PT Start Time  [place] During Treatment  Gait belt    Activity Tolerance  Patient tolerated treatment well    Behavior During Therapy  [facility] for tasks assessed/performed       Past Medical History:  Diagnosis Date  . Anxiety   . Arthritis   . CHF (congestive heart failure) ([place])   . COPD (chronic obstructive pulmonary disease) ([place])   . Edema    feet/legs  . Hypertension    dr Otho Najjar     . Hypothyroidism   . Leg weakness, bilateral   . Osteoporosis   . Oxygen deficit    2l with bipap at night  . RLS (restless legs syndrome)   . Shortness of breath   . Sleep apnea    bipap  . Wheezing     Past Surgical History:  Procedure Laterality Date  . BACK SURGERY     cervical  . BREAST BIOPSY Left    needle bx-neg  . CATARACT EXTRACTION W/PHACO Left 05/07/2017   Procedure: CATARACT EXTRACTION PHACO AND INTRAOCULAR LENS PLACEMENT (IOC);  Surgeon: Birder Robson, MD;  Location: [facility];  Service: Ophthalmology;  Laterality: Left;  [place] 00:48.7 AP% 11.3 CDE 5.46 Fluid Pack Lot # [id number]  . CATARACT EXTRACTION W/PHACO Right 04/23/2017   Procedure: CATARACT EXTRACTION PHACO AND INTRAOCULAR LENS PLACEMENT ([place]);  Surgeon: Birder Robson, MD;  Location: [facility];  Service: Ophthalmology;  Laterality: Right;  [place] 00:29 AP% 12.8 CDE 3.79 FLUID PACK LOT # [id number]  . FOOT ARTHROPLASTY    . JOINT REPLACEMENT     x2 tkr  . TOTAL SHOULDER  ARTHROPLASTY Left 07/02/2013   Procedure: LEFT TOTAL SHOULDER ARTHROPLASTY;  Surgeon: Marin Shutter, MD;  Location: [facility];  Service: Orthopedics;  Laterality: Left;  . TOTAL SHOULDER ARTHROPLASTY Right 04/10/2018   Procedure: TOTAL REVERSE SHOULDER ARTHROPLASTY;  Surgeon: Justice Britain, MD;  Location: [facility];  Service: Orthopedics;  Laterality: Right;  175mn    There were no vitals filed for this visit.  Subjective Assessment - 12/02/18 1231    Subjective  Pt. states she had a good time at [facility]  Pt. reports no falls or stumbles.  Pt. states she had difficulty stepping into bathtub    Pertinent History  extensive PMH including intensive care unit stays, rehab facility stays, previously ambulated with RW.. COPD, SOB, HTN. Denies CHF, reported she has been cleared by a cardiologist.    Limitations  Walking;Lifting;Standing;Writing;House hold activities    How long can you stand comfortably?  3-4 hours    How long can you walk comfortably?  3-4 hours    Diagnostic tests  last x-rays of left knee where in November 2016 which looked good;     Patient Stated Goals  Increase B LE muscle strength/ safety with gait    Currently in Pain?  No/denies  Therapeutic Exercises  Nustep, L4, 10 min B UE/LE  Seated marches with 5# ankle weights - 2x20 Seated LAQ with 5# ankle weights - 2x20 Seated 2# dumbells: bicep curls/ press-ups 20x. Standing forward lunges with 5# ankle wt. (at //-bars)/ wt. Shifting L/R.    Standing hip abduction with 5# ankle weights. Cuing for upright posture Standing RTB rows - 2x20 Cuing for technique Standing RTB ER 2x20 Cuing for technique  Neuro: Standing narrow stance 2x30 sec  Standing semi tandem - 2x30 sec B LEs- Progress from B to intermittent UE suport Cone taps B LEs - x multiple repetitions. Difficulty clearing cones on L LE. Seated heel raises - 2x20 Seated core twists with RTB B directions - x multiple reps     PT Short Term Goals -  08/12/18 1236      PT SHORT TERM GOAL #1   Title  Pt. will increase AAROM with ER to 30 degrees in neutral  without increase in pain to regain independent self-care tasks.     Baseline  30 deg. ER on R.      Time  4    Period  Weeks    Status  Achieved    Target Date  06/05/18        PT Long Term Goals - 11/06/18 1221      PT LONG TERM GOAL #1   Title  Pt. will increase FOTO to 56 to improve functional mobility.    Baseline  Initial FOTO: 51; FOTO 57 10/06/2018; Pt FOTO 61 11/06/2018    Time  4    Period  Weeks    Status  Achieved    Target Date  11/06/18      PT LONG TERM GOAL #2   Title  Pt. will be independent with HEP to increase B LE muscle strength 1/2 muscle grade to improve standing tolerance/ gait.    Baseline  B LE AROM: limited L ankle AROM as compared to R ankle. B LE muscle weakness: hip flexor 4/5 MMT, quads/ hamstring 4+/5 MMT, hip abduction 4+/5 MMT, hip adduction 4/5 MMT, ankle 4/5 MMT except PF 3+/5 MMT.; L/R: Hip flexion 4/5, 4+/5; Knee extension 5/5 B Knee flexion 5/5 B' DF 5/5 B 10/06/2018; Pt LE strength grossly 5/5 except R hip flexoin 4+/5 11/06/2018    Time  4    Period  Weeks    Status  Partially Met    Target Date  12/04/18      PT LONG TERM GOAL #3   Title  Pt. will demonstrate improved upright posture for >15 minutes with standing ther.ex.    Baseline  Poor standing posture/ forward head/ rounded sh; Pt maintained improved upright posture for 10 minutes before requiring cuing 10/06/2018; pt able to self correct posture without cuing ~50% of time, demonstrating improved upright posture with standing activities still within still for ~10 min 11/04/2018    Time  4    Period  Weeks    Status  Partially Met      PT LONG TERM GOAL #4   Title  Pt. able to ambulate outside with consistent hip flexion/ heel strike and least restrictive assistive device safely.    Baseline  Pt. ambulates with RW with limited hip flexion/ increase fall risk; Pt with improved  heelstrike, but inconsistend hip flexion 10/06/2018; Pt still requires ~60-70% cuing for consistent hip flexion, heel strike and upright posture with RW 11/06/2018    Time  4    Period  Weeks    Status  Partially Met      PT LONG TERM GOAL #5   Title  Pt will ambulate with consistent reciprocal step pattern 100% of the time with minor cuing instead of step-to gait pattern with walker to improve mobility gait mechanics.    Baseline  Pt requires cuing ~80% of the time to increase step length; pt requiring cuing ~50-60% time to increase step length 11/04/2018    Time  4    Period  Weeks    Status  Partially Met            Plan - 12/02/18 1232    Personal Factors and Comorbidities  Age    Stability/Clinical Decision Making  Evolving/Moderate complexity    Clinical Decision Making  Moderate    Rehab Potential  Good    Clinical Impairments Affecting Rehab Potential  positive: motivation, family support, negative: co-morbidities, deconditioned, high fall risk; Body structures that will need to be addressed: weakness, numbness/tingling, impaired balance, difficulty walking, decreased transfer ability, etc; Due to co-morbidities her clinical presenstation is evolving;     PT Frequency  2x / week    PT Duration  4 weeks    PT Treatment/Interventions  Cryotherapy;Electrical Stimulation;Ultrasound;Moist Heat;Gait training;Functional mobility training;Therapeutic activities;Therapeutic exercise;Neuromuscular re-education;Scar mobilization;Balance training;Stair training;Patient/family education;Manual techniques    PT Next Visit Plan  progress endurance with gait using LRD; progress balance exercises    PT Home Exercise Plan  See prescribed HEP.     Consulted and Agree with Plan of Care  Patient       Patient will benefit from skilled therapeutic intervention in order to improve the following deficits and impairments:  Decreased endurance, Decreased range of motion, Decreased skin integrity,  Decreased strength, Hypomobility, Impaired UE functional use, Increased fascial restricitons, Pain, Postural dysfunction, Impaired flexibility, Decreased scar mobility, Decreased mobility, Decreased balance, Improper body mechanics, Abnormal gait  Visit Diagnosis: Muscle weakness (generalized)  Gait difficulty  Abnormal posture  S/P reverse total shoulder arthroplasty, right     Problem List Patient Active Problem List   Diagnosis Date Noted  . S/P reverse total shoulder arthroplasty, 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  . Chronic diastolic heart failure ([place]) 08/09/2016  . COPD (chronic obstructive pulmonary disease) ([place]) 08/09/2016  . Pulmonary infiltrates   . Pneumonia due to Streptococcus pneumoniae ([place])   . Fever   . Community acquired pneumonia of right lung   . Palliative care by specialist   . Goals of care, counseling/discussion   . Pressure injury of skin 07/03/2016  . COPD exacerbation ([place]) 07/21/2015  . Acute renal insufficiency 07/21/2015  . Hyperglycemia 07/21/2015  . Benign essential HTN 07/21/2015  . S/P shoulder replacement 07/02/2013    Pura Spice 12/02/2018, 12:38 PM  [facility] [address] [place], [name], [address] Phone: [phone]   Fax:  [phone]  Name: Hannah Powell MRN: [id number] Date of Birth: 1945/10/09

## 2018-12-03 NOTE — Therapy (Signed)
[facility] [address] [place], [name], [address] Phone: [phone]   Fax:  [phone]  Physical Therapy Treatment  Patient Details  Name: Hannah Powell MRN: [id number] Date of Birth: [birth year] Referring Provider (PT): Dr. Ramonita Lab III   Encounter Date: 12/02/2018  PT End of Session - 12/02/18 1232    Visit Number  23    Number of Visits  24    Date for PT Re-Evaluation  12/02/18    Authorization - Visit Number  8    Authorization - Number of Visits  10    PT Start Time  1227    PT Stop Time  5631    PT Time Calculation (min)  50 min    Equipment Utilized During Treatment  Gait belt    Activity Tolerance  Patient tolerated treatment well    Behavior During Therapy  [facility] for tasks assessed/performed       Past Medical History:  Diagnosis Date  . Anxiety   . Arthritis   . CHF (congestive heart failure) ([place])   . COPD (chronic obstructive pulmonary disease) ([place])   . Edema    feet/legs  . Hypertension    dr Otho Najjar     . Hypothyroidism   . Leg weakness, bilateral   . Osteoporosis   . Oxygen deficit    2l with bipap at night  . RLS (restless legs syndrome)   . Shortness of breath   . Sleep apnea    bipap  . Wheezing     Past Surgical History:  Procedure Laterality Date  . BACK SURGERY     cervical  . BREAST BIOPSY Left    needle bx-neg  . CATARACT EXTRACTION W/PHACO Left 05/07/2017   Procedure: CATARACT EXTRACTION PHACO AND INTRAOCULAR LENS PLACEMENT (IOC);  Surgeon: Birder Robson, MD;  Location: [facility];  Service: Ophthalmology;  Laterality: Left;  [place] 00:48.7 AP% 11.3 CDE 5.46 Fluid Pack Lot # [id number]  . CATARACT EXTRACTION W/PHACO Right 04/23/2017   Procedure: CATARACT EXTRACTION PHACO AND INTRAOCULAR LENS PLACEMENT ([place]);  Surgeon: Birder Robson, MD;  Location: [facility];  Service: Ophthalmology;  Laterality: Right;  [place] 00:29 AP% 12.8 CDE 3.79 FLUID PACK LOT # [id number]  . FOOT  ARTHROPLASTY    . JOINT REPLACEMENT     x2 tkr  . TOTAL SHOULDER ARTHROPLASTY Left 07/02/2013   Procedure: LEFT TOTAL SHOULDER ARTHROPLASTY;  Surgeon: Marin Shutter, MD;  Location: [facility];  Service: Orthopedics;  Laterality: Left;  . TOTAL SHOULDER ARTHROPLASTY Right 04/10/2018   Procedure: TOTAL REVERSE SHOULDER ARTHROPLASTY;  Surgeon: Justice Britain, MD;  [facility];  Service: Orthopedics;  Laterality: Right;  110mn    There were no vitals filed for this visit.  Subjective Assessment - 12/02/18 1231    Subjective  [facility]  Pt. reports no falls or stumbles.  Pt. states she had difficulty stepping into bathtub    Pertinent History  extensive PMH including intensive care unit stays, rehab facility stays, previously ambulated with RW.. COPD, SOB, HTN. Denies CHF, reported she has been cleared by a cardiologist.    Limitations  Walking;Lifting;Standing;Writing;House hold activities    How long can you stand comfortably?  3-4 hours    How long can you walk comfortably?  3-4 hours    Diagnostic tests  last x-rays of left knee where in November 2016 which looked good;     Patient Stated Goals  Increase B LE muscle strength/ safety with gait    Currently in Pain?  No/denies       Therapeutic Exercises  Nustep, L4, 10 min B UE/LE  Seated marches with 5# ankle weights -2x20 Seated LAQ with 5# ankle weights -2x20 Seated 2# dumbells: bicep curls/ press-ups 20x. Standing forward lunges with 5# ankle wt. (at //-bars)/ wt. Shifting L/R.   Standing hip abduction, extension with 5# ankle weights. 2x10 each Cuing for upright posture Standing RTB rows- 2x20 Cuing for technique Standing RTB ER2x20 Cuing for technique  Neuro: Standing narrow stance2x30 sec Standing semi tandem- 2x30 sec B LEs- Progress from B to intermittent UE suport Obstacle course with step and cone taps B LEs -x multiple repetitions. Difficulty clearing step on L  LE/demonstrates     PT Education - 12/02/18 1405    Education provided  Yes    Education Details  Pt educted on toe-touch modified single leg stance exercise    Person(s) Educated  Patient    Methods  Demonstration;Explanation    Comprehension  Verbalized understanding;Returned demonstration       PT Short Term Goals - 08/12/18 1236      PT SHORT TERM GOAL #1   Title  Pt. will increase AAROM with ER to 30 degrees in neutral  without increase in pain to regain independent self-care tasks.     Baseline  30 deg. ER on R.      Time  4    Period  Weeks    Status  Achieved    Target Date  06/05/18        PT Long Term Goals - 11/06/18 1221      PT LONG TERM GOAL #1   Title  Pt. will increase FOTO to 56 to improve functional mobility.    Baseline  Initial FOTO: 51; FOTO 57 10/06/2018; Pt FOTO 61 11/06/2018    Time  4    Period  Weeks    Status  Achieved    Target Date  11/06/18      PT LONG TERM GOAL #2   Title  Pt. will be independent with HEP to increase B LE muscle strength 1/2 muscle grade to improve standing tolerance/ gait.    Baseline  B LE AROM: limited L ankle AROM as compared to R ankle. B LE muscle weakness: hip flexor 4/5 MMT, quads/ hamstring 4+/5 MMT, hip abduction 4+/5 MMT, hip adduction 4/5 MMT, ankle 4/5 MMT except PF 3+/5 MMT.; L/R: Hip flexion 4/5, 4+/5; Knee extension 5/5 B Knee flexion 5/5 B' DF 5/5 B 10/06/2018; Pt LE strength grossly 5/5 except R hip flexoin 4+/5 11/06/2018    Time  4    Period  Weeks    Status  Partially Met    Target Date  12/04/18      PT LONG TERM GOAL #3   Title  Pt. will demonstrate improved upright posture for >15 minutes with standing ther.ex.    Baseline  Poor standing posture/ forward head/ rounded sh; Pt maintained improved upright posture for 10 minutes before requiring cuing 10/06/2018; pt able to self correct posture without cuing ~50% of time, demonstrating improved upright posture with standing activities still within still for  ~10 min 11/04/2018    Time  4    Period  Weeks    Status  Partially Met      PT LONG TERM GOAL #4   Title  Pt. able to ambulate outside with consistent hip flexion/ heel  strike and least restrictive assistive device safely.    Baseline  Pt. ambulates with RW with limited hip flexion/ increase fall risk; Pt with improved heelstrike, but inconsistend hip flexion 10/06/2018; Pt still requires ~60-70% cuing for consistent hip flexion, heel strike and upright posture with RW 11/06/2018    Time  4    Period  Weeks    Status  Partially Met      PT LONG TERM GOAL #5   Title  Pt will ambulate with consistent reciprocal step pattern 100% of the time with minor cuing instead of step-to gait pattern with walker to improve mobility gait mechanics.    Baseline  Pt requires cuing ~80% of the time to increase step length; pt requiring cuing ~50-60% time to increase step length 11/04/2018    Time  4    Period  Weeks    Status  Partially Met         Plan - 12/02/18 1232    Clinical Impression Statement  Pt able to progress from B to intermittent UE support with semi tandem stance in therapy today. Pt still with decreased gait speed and reports of fatigue with gait. Pt requires frequent cues to increase step length and ambulate with upright posture during obstacle course, and expresses FOF. During obstacle course pt had difficulty generating sufficient hip flexion of L LE to clear step, but was able to correct 70% of time with cues. Pt will continue to benefit from further skilled therapy to increase B LE strength, balance, and improve gait mechanics.    Personal Factors and Comorbidities  Age    Stability/Clinical Decision Making  Evolving/Moderate complexity    Clinical Decision Making  Moderate    Rehab Potential  Good    Clinical Impairments Affecting Rehab Potential  positive: motivation, family support, negative: co-morbidities, deconditioned, high fall risk; Body structures that will need to be  addressed: weakness, numbness/tingling, impaired balance, difficulty walking, decreased transfer ability, etc; Due to co-morbidities her clinical presenstation is evolving;     PT Frequency  2x / week    PT Duration  4 weeks    PT Treatment/Interventions  Cryotherapy;Electrical Stimulation;Ultrasound;Moist Heat;Gait training;Functional mobility training;Therapeutic activities;Therapeutic exercise;Neuromuscular re-education;Scar mobilization;Balance training;Stair training;Patient/family education;Manual techniques    PT Next Visit Plan  progress endurance with gait using LRD; progress balance exercises; progress hip flexion exercises    PT Home Exercise Plan  See prescribed HEP.     Consulted and Agree with Plan of Care  Patient       Patient will benefit from skilled therapeutic intervention in order to improve the following deficits and impairments:  Decreased endurance, Decreased range of motion, Decreased skin integrity, Decreased strength, Hypomobility, Impaired UE functional use, Increased fascial restricitons, Pain, Postural dysfunction, Impaired flexibility, Decreased scar mobility, Decreased mobility, Decreased balance, Improper body mechanics, Abnormal gait  Visit Diagnosis: Muscle weakness (generalized)  Gait difficulty  Abnormal posture  S/P reverse total shoulder arthroplasty, right     Problem List Patient Active Problem List   Diagnosis Date Noted  . S/P reverse total shoulder arthroplasty, 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  . Chronic diastolic heart failure ([place]) 08/09/2016  . COPD (chronic obstructive pulmonary disease) ([place]) 08/09/2016  . Pulmonary infiltrates   . Pneumonia due to Streptococcus pneumoniae ([place])   . Fever   . Community acquired pneumonia of right lung   . Palliative care by specialist   . Goals of care, counseling/discussion   . Pressure injury of skin 07/03/2016  . COPD exacerbation ([place])  07/21/2015  . Acute renal insufficiency 07/21/2015  . Hyperglycemia  07/21/2015  . Benign essential HTN 07/21/2015  . S/P shoulder replacement 07/02/2013   Pura Spice, PT, DPT # [id number] SPT 12/03/2018, 3:27 PM  [facility] [address] [place], [name], [address] Phone: [phone]   Fax:  [phone]  Name: Jessly Lebeck MRN: [id number] Date of Birth: 08/26/1945

## 2018-12-04 ENCOUNTER — Other Ambulatory Visit: Payer: Self-pay

## 2018-12-04 ENCOUNTER — Encounter: Payer: Self-pay | Admitting: Physical Therapy

## 2018-12-04 ENCOUNTER — Ambulatory Visit: Payer: Medicare HMO | Admitting: Physical Therapy

## 2018-12-04 DIAGNOSIS — R269 Unspecified abnormalities of gait and mobility: Secondary | ICD-10-CM

## 2018-12-04 DIAGNOSIS — R293 Abnormal posture: Secondary | ICD-10-CM

## 2018-12-04 DIAGNOSIS — Z96611 Presence of right artificial shoulder joint: Secondary | ICD-10-CM | POA: Diagnosis not present

## 2018-12-04 DIAGNOSIS — R0902 Hypoxemia: Secondary | ICD-10-CM | POA: Diagnosis not present

## 2018-12-04 DIAGNOSIS — I509 Heart failure, unspecified: Secondary | ICD-10-CM | POA: Diagnosis not present

## 2018-12-04 DIAGNOSIS — R0689 Other abnormalities of breathing: Secondary | ICD-10-CM | POA: Diagnosis not present

## 2018-12-04 DIAGNOSIS — M6281 Muscle weakness (generalized): Secondary | ICD-10-CM

## 2018-12-04 DIAGNOSIS — J449 Chronic obstructive pulmonary disease, unspecified: Secondary | ICD-10-CM | POA: Diagnosis not present

## 2018-12-04 NOTE — Therapy (Signed)
[place] [facility] [address] [place], [name], [address] Phone: [phone]   Fax:  [phone]  Physical Therapy Treatment  Patient Details  Name: Hannah Powell MRN: [id number] Date of Birth: [birth year] Referring Provider (PT): Dr. Ramonita Lab III   Encounter Date: 12/04/2018  PT End of Session - 12/04/18 1501    Visit Number  24    Number of Visits  32    Date for PT Re-Evaluation  01/01/19    Authorization - Visit Number  1    Authorization - Number of Visits  10    PT Start Time  5003    PT Stop Time  1350    PT Time Calculation (min)  74 min    Equipment Utilized During Treatment  Gait belt    Activity Tolerance  Patient tolerated treatment well    Behavior During Therapy  [facility] for tasks assessed/performed       Past Medical History:  Diagnosis Date  . Anxiety   . Arthritis   . CHF (congestive heart failure) ([place])   . COPD (chronic obstructive pulmonary disease) ([place])   . Edema    feet/legs  . Hypertension    dr Otho Najjar     . Hypothyroidism   . Leg weakness, bilateral   . Osteoporosis   . Oxygen deficit    2l with bipap at night  . RLS (restless legs syndrome)   . Shortness of breath   . Sleep apnea    bipap  . Wheezing     Past Surgical History:  Procedure Laterality Date  . BACK SURGERY     cervical  . BREAST BIOPSY Left    needle bx-neg  . CATARACT EXTRACTION W/PHACO Left 05/07/2017   Procedure: CATARACT EXTRACTION PHACO AND INTRAOCULAR LENS PLACEMENT (IOC);  Surgeon: Birder Robson, MD;  Location: [facility];  Service: Ophthalmology;  Laterality: Left;  [place] 00:48.7 AP% 11.3 CDE 5.46 Fluid Pack Lot # [id number]  . CATARACT EXTRACTION W/PHACO Right 04/23/2017   Procedure: CATARACT EXTRACTION PHACO AND INTRAOCULAR LENS PLACEMENT ([place]);  Surgeon: Birder Robson, MD;  Location: [facility];  Service: Ophthalmology;  Laterality: Right;  [place] 00:29 AP% 12.8 CDE 3.79 FLUID PACK LOT # [id number] H  . FOOT  ARTHROPLASTY    . JOINT REPLACEMENT     x2 tkr  . TOTAL SHOULDER ARTHROPLASTY Left 07/02/2013   Procedure: LEFT TOTAL SHOULDER ARTHROPLASTY;  Surgeon: Marin Shutter, MD;  Location: [place];  Service: Orthopedics;  Laterality: Left;  . TOTAL SHOULDER ARTHROPLASTY Right 04/10/2018   Procedure: TOTAL REVERSE SHOULDER ARTHROPLASTY;  Surgeon: Justice Britain, MD;  Location: [facility];  Service: Orthopedics;  Laterality: Right;  131mn    There were no vitals filed for this visit.  Subjective Assessment - 12/04/18 1457    Subjective  Pt reports energy levels have improved since talking to cardiologist. Pt reports no pain today and that she is going to the gym tomorrow.    Pertinent History  extensive PMH including intensive care unit stays, rehab facility stays, previously ambulated with RW.. COPD, SOB, HTN. Denies CHF, reported she has been cleared by a cardiologist.    Limitations  Walking;Lifting;Standing;Writing;House hold activities    How long can you stand comfortably?  3-4 hours    How long can you walk comfortably?  3-4 hours    Diagnostic tests  last x-rays of left knee where in November 2016 which looked good;     Patient Stated Goals  Increase B LE muscle strength/ safety with gait    Currently in Pain?  No/denies      [facility] PT Assessment - 12/04/18 0001      Assessment   Medical Diagnosis  Bilateral leg weakness/ Gait difficulty    Referring Provider (PT)  Dr. Ramonita Lab III    Onset Date/Surgical Date  03/19/18    Hand Dominance  Right    Prior Therapy  Pt. known to PT clinic      Prior Function   Level of Independence  Requires assistive device for independence      Cognition   Overall Cognitive Status  Within Functional Limits for tasks assessed     MMT: 5/5 grossly in B LEs  Therapeutic Exercise:  Ambulation around clinic with RW - 3x Cuing to increase L step length ~50% of time, frequent cuing for posture. Improved gait speed but not WNL.  Obstacle course with uneven  surfaces and step over with RW and hemiwalker - 2x RW, 1x hemiwalker. Significant decrease in gait speed with hemiwalker and reports of FOF. Seated marching 3# ankle weights -2x20 Cuing for increased ROM heel raises, 3# ankle weights - 2x20  Seated toe raises, 3# ankle weights - 2x20     PT Education - 12/04/18 1458    Education Details  Pt educated on gait technique with using hemiwalker during obstacle course    Person(s) Educated  Patient    Methods  Explanation;Demonstration    Comprehension  Verbalized understanding;Returned demonstration       PT Short Term Goals - 08/12/18 1236      PT SHORT TERM GOAL #1   Title  Pt. will increase AAROM with ER to 30 degrees in neutral  without increase in pain to regain independent self-care tasks.     Baseline  30 deg. ER on R.      Time  4    Period  Weeks    Status  Achieved    Target Date  06/05/18        PT Long Term Goals - 12/04/18 1717      PT LONG TERM GOAL #1   Title  Pt. will increase FOTO to 56 to improve functional mobility.    Baseline  Initial FOTO: 51; FOTO 57 10/06/2018; Pt FOTO 61 11/06/2018    Time  4    Period  Weeks    Status  Achieved      PT LONG TERM GOAL #2   Title  Pt. will be independent with HEP to increase B LE muscle strength 1/2 muscle grade to improve standing tolerance/ gait.    Baseline  B LE AROM: limited L ankle AROM as compared to R ankle. B LE muscle weakness: hip flexor 4/5 MMT, quads/ hamstring 4+/5 MMT, hip abduction 4+/5 MMT, hip adduction 4/5 MMT, ankle 4/5 MMT except PF 3+/5 MMT.; L/R: Hip flexion 4/5, 4+/5; Knee extension 5/5 B Knee flexion 5/5 B' DF 5/5 B 10/06/2018; Pt LE strength grossly 5/5 except R hip flexoin 4+/5 11/06/2018; Pt B LE strength grossly 5/5 12/04/2018    Time  4    Period  Weeks    Status  Achieved      PT LONG TERM GOAL #3   Title  Pt. will demonstrate improved upright posture for >15 minutes with standing ther.ex.    Baseline  Poor standing posture/ forward head/  rounded sh; Pt maintained improved upright posture for 10 minutes before requiring cuing 10/06/2018; pt able  to self correct posture without cuing ~50% of time, demonstrating improved upright posture with standing activities still within still for ~10 min 11/04/2018; Pt requires frequent cuing today for upright posture with standing exercises (required cuing within <5 min of starting exercises) 12/04/2018    Time  4    Period  Weeks    Status  Partially Met    Target Date  01/01/19      PT LONG TERM GOAL #4   Title  Pt. able to ambulate outside with consistent hip flexion/ heel strike and least restrictive assistive device safely.    Baseline  Pt. ambulates with RW with limited hip flexion/ increase fall risk; Pt with improved heelstrike, but inconsistend hip flexion 10/06/2018; Pt still requires ~60-70% cuing for consistent hip flexion, heel strike and upright posture with RW 11/06/2018; Unable to assess outside d/t weather, however, pt requires cuing for hip flexion/step length/posture during obstacle course with RW approx 50% of time, >70% with hemiwalker 12/04/2018    Time  4    Period  Weeks    Status  Partially Met    Target Date  01/01/19      PT LONG TERM GOAL #5   Title  Pt will ambulate with consistent reciprocal step pattern 100% of the time with minor cuing instead of step-to gait pattern with walker to improve mobility gait mechanics.    Baseline  Pt requires cuing ~80% of the time to increase step length; pt requiring cuing ~50-60% time to increase step length 11/04/2018; pt requires cuing for increased step-length (to avoid step-to pattern) approx 50% of time with RW and >70% with hemiwalker. 12/04/2018    Time  4    Period  Weeks    Status  Partially Met    Target Date  01/01/19      PT LONG TERM GOAL #6   Title  Pt will complete 10MWT with RW to asses pt gait speed    Baseline  Pt has not completed 10MWT    Time  4    Period  Weeks    Status  New            Plan -  12/04/18 1733    Clinical Impression Statement  Pt shows improvement with B LE MMT, which was grossly 5/5 for all movements. Pt also with notably increased gait speed with RW compared to previous session, with increased use of reciprocal pattern (requiring cuing approx 50% of time for technique). However, pt gait speed with RW still not WNL. Pt also required cues within <5 minutes of beginning exercises for upright posture. Pt expressed significant FOF when using hemiwalker with obstacle course, only completing one repetition of course before having to stop due to FOF. Pt gait speed and step length considerably decreased with hemiwalker compared to RW. Pt was able to complete 2 reps of obstacle course without FOF with RW, and demonstrated sufficient B hip flexion for clearance over 6" obstacle. Pt will continue to benefit from further skilled therapy to improve gait speed and mechanics in order to improve ease with community participation.    Personal Factors and Comorbidities  Age    Stability/Clinical Decision Making  Evolving/Moderate complexity    Rehab Potential  Good    Clinical Impairments Affecting Rehab Potential  positive: motivation, family support, negative: co-morbidities, deconditioned, high fall risk; Body structures that will need to be addressed: weakness, numbness/tingling, impaired balance, difficulty walking, decreased transfer ability, etc; Due to co-morbidities her clinical presenstation  is evolving;     PT Frequency  2x / week    PT Duration  4 weeks    PT Treatment/Interventions  Cryotherapy;Electrical Stimulation;Ultrasound;Moist Heat;Gait training;Functional mobility training;Therapeutic activities;Therapeutic exercise;Neuromuscular re-education;Scar mobilization;Balance training;Stair training;Patient/family education;Manual techniques    PT Next Visit Plan  progress endurance with gait using LRD; progress balance exercises; progress hip flexion exercises; gait with hemiwalker and  10MWT    PT Home Exercise Plan  See prescribed HEP.     Consulted and Agree with Plan of Care  Patient       Patient will benefit from skilled therapeutic intervention in order to improve the following deficits and impairments:  Decreased endurance, Decreased range of motion, Decreased skin integrity, Decreased strength, Hypomobility, Impaired UE functional use, Increased fascial restricitons, Pain, Postural dysfunction, Impaired flexibility, Decreased scar mobility, Decreased mobility, Decreased balance, Improper body mechanics, Abnormal gait  Visit Diagnosis: Muscle weakness (generalized)  Gait difficulty  Abnormal posture     Problem List Patient Active Problem List   Diagnosis Date Noted  . S/P reverse total shoulder arthroplasty, 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  . Chronic diastolic heart failure ([place]) 08/09/2016  . COPD (chronic obstructive pulmonary disease) ([place]) 08/09/2016  . Pulmonary infiltrates   . Pneumonia due to Streptococcus pneumoniae ([place])   . Fever   . Community acquired pneumonia of right lung   . Palliative care by specialist   . Goals of care, counseling/discussion   . Pressure injury of skin 07/03/2016  . COPD exacerbation ([place]) 07/21/2015  . Acute renal insufficiency 07/21/2015  . Hyperglycemia 07/21/2015  . Benign essential HTN 07/21/2015  . S/P shoulder replacement 07/02/2013   Pura Spice, PT, DPT # [place], SPT 12/04/2018, 5:36 PM  [facility] [address] [place], [name], [address] Phone: [phone]   Fax:  [phone]  Name: Stephine Langbehn MRN: [id number] Date of Birth: 02/15/1946

## 2018-12-08 ENCOUNTER — Ambulatory Visit: Payer: Medicare HMO | Admitting: Physical Therapy

## 2018-12-09 ENCOUNTER — Encounter: Payer: Medicare HMO | Admitting: Physical Therapy

## 2018-12-09 DIAGNOSIS — Z23 Encounter for immunization: Secondary | ICD-10-CM | POA: Diagnosis not present

## 2018-12-09 DIAGNOSIS — J9611 Chronic respiratory failure with hypoxia: Secondary | ICD-10-CM | POA: Diagnosis not present

## 2018-12-09 DIAGNOSIS — I5032 Chronic diastolic (congestive) heart failure: Secondary | ICD-10-CM | POA: Diagnosis not present

## 2018-12-11 ENCOUNTER — Encounter: Payer: Self-pay | Admitting: Physical Therapy

## 2018-12-11 ENCOUNTER — Other Ambulatory Visit: Payer: Self-pay

## 2018-12-11 ENCOUNTER — Ambulatory Visit: Payer: Medicare HMO

## 2018-12-11 DIAGNOSIS — R293 Abnormal posture: Secondary | ICD-10-CM

## 2018-12-11 DIAGNOSIS — M6281 Muscle weakness (generalized): Secondary | ICD-10-CM

## 2018-12-11 DIAGNOSIS — Z96611 Presence of right artificial shoulder joint: Secondary | ICD-10-CM | POA: Diagnosis not present

## 2018-12-11 DIAGNOSIS — R269 Unspecified abnormalities of gait and mobility: Secondary | ICD-10-CM

## 2018-12-11 NOTE — Therapy (Signed)
[place] [facility] [address] [place], [name], [address] Phone: [phone]   Fax:  [phone]  Physical Therapy Treatment  Patient Details  Name: Genice Kimberlin MRN: [id number] Date of Birth: 21-Jul-1945 Referring Provider (PT): Dr. Ramonita Lab III   Encounter Date: 12/11/2018  PT End of Session - 12/11/18 1534    Visit Number  25    Number of Visits  32    Date for PT Re-Evaluation  01/01/19    Authorization - Visit Number  2    Authorization - Number of Visits  10    PT Start Time  2694    PT Stop Time  1515    PT Time Calculation (min)  47 min    Equipment Utilized During Treatment  Gait belt    Activity Tolerance  Patient tolerated treatment well    Behavior During Therapy  [facility] for tasks assessed/performed       Past Medical History:  Diagnosis Date  . Anxiety   . Arthritis   . CHF (congestive heart failure) ([place])   . COPD (chronic obstructive pulmonary disease) ([place])   . Edema    feet/legs  . Hypertension    dr Otho Najjar     . Hypothyroidism   . Leg weakness, bilateral   . Osteoporosis   . Oxygen deficit    2l with bipap at night  . RLS (restless legs syndrome)   . Shortness of breath   . Sleep apnea    bipap  . Wheezing     Past Surgical History:  Procedure Laterality Date  . BACK SURGERY     cervical  . BREAST BIOPSY Left    needle bx-neg  . CATARACT EXTRACTION W/PHACO Left 05/07/2017   Procedure: CATARACT EXTRACTION PHACO AND INTRAOCULAR LENS PLACEMENT (IOC);  Surgeon: Birder Robson, MD;  Location: [facility];  Service: Ophthalmology;  Laterality: Left;  [place] 00:48.7 AP% 11.3 CDE 5.46 Fluid Pack Lot # [id number]  . CATARACT EXTRACTION W/PHACO Right 04/23/2017   Procedure: CATARACT EXTRACTION PHACO AND INTRAOCULAR LENS PLACEMENT ([place]);  Surgeon: Birder Robson, MD;  Location: [facility];  Service: Ophthalmology;  Laterality: Right;  [place] 00:29 AP% 12.8 CDE 3.79 FLUID PACK LOT # [id number] H  . FOOT  ARTHROPLASTY    . JOINT REPLACEMENT     x2 tkr  . TOTAL SHOULDER ARTHROPLASTY Left 07/02/2013   Procedure: LEFT TOTAL SHOULDER ARTHROPLASTY;  Surgeon: Marin Shutter, MD;  Location: [place];  Service: Orthopedics;  Laterality: Left;  . TOTAL SHOULDER ARTHROPLASTY Right 04/10/2018   Procedure: TOTAL REVERSE SHOULDER ARTHROPLASTY;  Surgeon: Justice Britain, MD;  [facility];  Service: Orthopedics;  Laterality: Right;  124mn    There were no vitals filed for this visit.  Subjective Assessment - 12/11/18 1525    Subjective  Pt reports she did stress test this past Tuesday and that all was normal. Pt reports she has not been able to perform HEP because of waiting for results from doctor's appointment. Pt reports no pain today.    Pertinent History  extensive PMH including intensive care unit stays, rehab facility stays, previously ambulated with RW.. COPD, SOB, HTN. Denies CHF, reported she has been cleared by a cardiologist.    Limitations  Walking;Lifting;Standing;Writing;House hold activities    How long can you stand comfortably?  3-4 hours    How long can you walk comfortably?  3-4 hours    Diagnostic tests  last x-rays of left knee where in  November 2016 which looked good;     Patient Stated Goals  Increase B LE muscle strength/ safety with gait    Currently in Pain?  No/denies       Therapeutic Exercise:  Nustep L4, 10 min (unbilled) Sit<>stands 7x. Emphasis on eccentric quad control. Seated 3# marching ankle weights -2x20 Seated LAQ 3# ankle weights - 2x20 each Seated, 3# ankle weights, heel raises -2x20 B LEs Standing hip abduction, 3# ankle weights, 2x20 B LEs Standing hamstring curls, 3# ankle weights, 2x20 B LEs  (44mnute restroom break during session)     PT Education - 12/11/18 1526    Education Details  Pt educated on technique wtih standing hamstring curls.    Person(s) Educated  Patient    Methods  Explanation;Demonstration    Comprehension  Verbalized  understanding;Returned demonstration       PT Short Term Goals - 08/12/18 1236      PT SHORT TERM GOAL #1   Title  Pt. will increase AAROM with ER to 30 degrees in neutral  without increase in pain to regain independent self-care tasks.     Baseline  30 deg. ER on R.      Time  4    Period  Weeks    Status  Achieved    Target Date  06/05/18        PT Long Term Goals - 12/04/18 1717      PT LONG TERM GOAL #1   Title  Pt. will increase FOTO to 56 to improve functional mobility.    Baseline  Initial FOTO: 51; FOTO 57 10/06/2018; Pt FOTO 61 11/06/2018    Time  4    Period  Weeks    Status  Achieved      PT LONG TERM GOAL #2   Title  Pt. will be independent with HEP to increase B LE muscle strength 1/2 muscle grade to improve standing tolerance/ gait.    Baseline  B LE AROM: limited L ankle AROM as compared to R ankle. B LE muscle weakness: hip flexor 4/5 MMT, quads/ hamstring 4+/5 MMT, hip abduction 4+/5 MMT, hip adduction 4/5 MMT, ankle 4/5 MMT except PF 3+/5 MMT.; L/R: Hip flexion 4/5, 4+/5; Knee extension 5/5 B Knee flexion 5/5 B' DF 5/5 B 10/06/2018; Pt LE strength grossly 5/5 except R hip flexoin 4+/5 11/06/2018; Pt B LE strength grossly 5/5 12/04/2018    Time  4    Period  Weeks    Status  Achieved      PT LONG TERM GOAL #3   Title  Pt. will demonstrate improved upright posture for >15 minutes with standing ther.ex.    Baseline  Poor standing posture/ forward head/ rounded sh; Pt maintained improved upright posture for 10 minutes before requiring cuing 10/06/2018; pt able to self correct posture without cuing ~50% of time, demonstrating improved upright posture with standing activities still within still for ~10 min 11/04/2018; Pt requires frequent cuing today for upright posture with standing exercises (required cuing within <5 min of starting exercises) 12/04/2018    Time  4    Period  Weeks    Status  Partially Met    Target Date  01/01/19      PT LONG TERM GOAL #4   Title  Pt.  able to ambulate outside with consistent hip flexion/ heel strike and least restrictive assistive device safely.    Baseline  Pt. ambulates with RW with limited hip flexion/ increase fall risk; Pt  with improved heelstrike, but inconsistend hip flexion 10/06/2018; Pt still requires ~60-70% cuing for consistent hip flexion, heel strike and upright posture with RW 11/06/2018; Unable to assess outside d/t weather, however, pt requires cuing for hip flexion/step length/posture during obstacle course with RW approx 50% of time, >70% with hemiwalker 12/04/2018    Time  4    Period  Weeks    Status  Partially Met    Target Date  01/01/19      PT LONG TERM GOAL #5   Title  Pt will ambulate with consistent reciprocal step pattern 100% of the time with minor cuing instead of step-to gait pattern with walker to improve mobility gait mechanics.    Baseline  Pt requires cuing ~80% of the time to increase step length; pt requiring cuing ~50-60% time to increase step length 11/04/2018; pt requires cuing for increased step-length (to avoid step-to pattern) approx 50% of time with RW and >70% with hemiwalker. 12/04/2018    Time  4    Period  Weeks    Status  Partially Met    Target Date  01/01/19      PT LONG TERM GOAL #6   Title  Pt will complete 10MWT with RW to asses pt gait speed    Baseline  Pt has not completed 10MWT    Time  4    Period  Weeks    Status  New            Plan - 12/11/18 1526    Clinical Impression Statement  Pt with improved endurance/tolerance for all exercises today. PT adjusted height of RW to improve pt's gait mechanics and ease with navigating outdoor terrain. Pt still with decreased PF B with seated heel raises, and also demonstrates decreased AROM with R LE hamstring curls compared to L. Pt required mod. cuing today for posture with standing exercises. Pt will benefit from further skilled therapy to increase B LE strength and posture/gait mechanics.    Personal Factors and  Comorbidities  Age    Stability/Clinical Decision Making  Evolving/Moderate complexity    Clinical Decision Making  Moderate    Rehab Potential  Good    Clinical Impairments Affecting Rehab Potential  positive: motivation, family support, negative: co-morbidities, deconditioned, high fall risk; Body structures that will need to be addressed: weakness, numbness/tingling, impaired balance, difficulty walking, decreased transfer ability, etc; Due to co-morbidities her clinical presenstation is evolving;     PT Frequency  2x / week    PT Duration  4 weeks    PT Treatment/Interventions  Cryotherapy;Electrical Stimulation;Ultrasound;Moist Heat;Gait training;Functional mobility training;Therapeutic activities;Therapeutic exercise;Neuromuscular re-education;Scar mobilization;Balance training;Stair training;Patient/family education;Manual techniques    PT Next Visit Plan  10MWT, gait with hemiwalker    PT Home Exercise Plan  See prescribed HEP.     Consulted and Agree with Plan of Care  Patient       Patient will benefit from skilled therapeutic intervention in order to improve the following deficits and impairments:  Decreased endurance, Decreased range of motion, Decreased skin integrity, Decreased strength, Hypomobility, Impaired UE functional use, Increased fascial restricitons, Pain, Postural dysfunction, Impaired flexibility, Decreased scar mobility, Decreased mobility, Decreased balance, Improper body mechanics, Abnormal gait  Visit Diagnosis: Muscle weakness (generalized)  Gait difficulty  Abnormal posture     Problem List Patient Active Problem List   Diagnosis Date Noted  . S/P reverse total shoulder arthroplasty, 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  . Chronic diastolic heart failure ([place]) 08/09/2016  . COPD (chronic obstructive  pulmonary disease) ([place]) 08/09/2016  . Pulmonary infiltrates   . Pneumonia due to Streptococcus pneumoniae ([place])   . Fever   . Community acquired pneumonia of right lung    . Palliative care by specialist   . Goals of care, counseling/discussion   . Pressure injury of skin 07/03/2016  . COPD exacerbation ([place]) 07/21/2015  . Acute renal insufficiency 07/21/2015  . Hyperglycemia 07/21/2015  . Benign essential HTN 07/21/2015  . S/P shoulder replacement 07/02/2013    Chinita Greenland 12/11/2018, 3:34 PM   Lieutenant Diego PT, DPT 5:14 PM,12/11/18 [phone]   [facility] [address] [place], [name], [address] Phone: [phone]   Fax:  [phone]  Name: Amalee Olsen MRN: [id number] Date of Birth: 11/17/1945

## 2018-12-16 ENCOUNTER — Ambulatory Visit: Payer: Medicare HMO | Admitting: Physical Therapy

## 2018-12-18 ENCOUNTER — Ambulatory Visit: Payer: Medicare HMO | Attending: Internal Medicine | Admitting: Physical Therapy

## 2018-12-18 ENCOUNTER — Encounter: Payer: Self-pay | Admitting: Physical Therapy

## 2018-12-18 ENCOUNTER — Other Ambulatory Visit: Payer: Self-pay

## 2018-12-18 DIAGNOSIS — R293 Abnormal posture: Secondary | ICD-10-CM | POA: Insufficient documentation

## 2018-12-18 DIAGNOSIS — M6281 Muscle weakness (generalized): Secondary | ICD-10-CM | POA: Diagnosis not present

## 2018-12-18 DIAGNOSIS — R269 Unspecified abnormalities of gait and mobility: Secondary | ICD-10-CM | POA: Insufficient documentation

## 2018-12-18 NOTE — Therapy (Signed)
[place] [facility] [address]. [place], [name], [address] Phone: [phone]   Fax:  [phone]  Physical Therapy Treatment  Patient Details  Name: Hannah Powell MRN: [id number] Date of Birth: 10-03-1945 Referring Provider (PT): Dr. Ramonita Lab III   Encounter Date: 12/18/2018  PT End of Session - 12/18/18 1540    Visit Number  26    Number of Visits  32    Date for PT Re-Evaluation  01/01/19    Authorization - Visit Number  3    Authorization - Number of Visits  10    PT Start Time  4332    PT Stop Time  1525    PT Time Calculation (min)  56 min    Equipment Utilized During Treatment  Gait belt    Activity Tolerance  Patient tolerated treatment well    Behavior During Therapy  [facility] for tasks assessed/performed       Past Medical History:  Diagnosis Date  . Anxiety   . Arthritis   . CHF (congestive heart failure) ([place])   . COPD (chronic obstructive pulmonary disease) ([place])   . Edema    feet/legs  . Hypertension    dr Otho Najjar     . Hypothyroidism   . Leg weakness, bilateral   . Osteoporosis   . Oxygen deficit    2l with bipap at night  . RLS (restless legs syndrome)   . Shortness of breath   . Sleep apnea    bipap  . Wheezing     Past Surgical History:  Procedure Laterality Date  . BACK SURGERY     cervical  . BREAST BIOPSY Left    needle bx-neg  . CATARACT EXTRACTION W/PHACO Left 05/07/2017   Procedure: CATARACT EXTRACTION PHACO AND INTRAOCULAR LENS PLACEMENT (IOC);  Surgeon: Birder Robson, MD;  Location: [facility];  Service: Ophthalmology;  Laterality: Left;  [place] 00:48.7 AP% 11.3 CDE 5.46 Fluid Pack Lot # [id number]  . CATARACT EXTRACTION W/PHACO Right 04/23/2017   Procedure: CATARACT EXTRACTION PHACO AND INTRAOCULAR LENS PLACEMENT ([place]);  Surgeon: Birder Robson, MD;  Location: [facility];  Service: Ophthalmology;  Laterality: Right;  [place] 00:29 AP% 12.8 CDE 3.79 FLUID PACK LOT # [id number]  . FOOT  ARTHROPLASTY    . JOINT REPLACEMENT     x2 tkr  . TOTAL SHOULDER ARTHROPLASTY Left 07/02/2013   Procedure: LEFT TOTAL SHOULDER ARTHROPLASTY;  Surgeon: Marin Shutter, MD;  Location: [facility];  Service: Orthopedics;  Laterality: Left;  . TOTAL SHOULDER ARTHROPLASTY Right 04/10/2018   Procedure: TOTAL REVERSE SHOULDER ARTHROPLASTY;  Surgeon: Justice Britain, MD;  Location[facility];  Service: Orthopedics;  Laterality: Right;  120mn    There were no vitals filed for this visit.  Subjective Assessment - 12/18/18 1456    Subjective  Pt reports no pain today. Pt reports going to the gym to work out her LEs and doing some walking.    Pertinent History  extensive PMH including intensive care unit stays, rehab facility stays, previously ambulated with RW.. COPD, SOB, HTN. Denies CHF, reported she has been cleared by a cardiologist.    Limitations  Walking;Lifting;Standing;Writing;House hold activities    How long can you stand comfortably?  3-4 hours    How long can you walk comfortably?  3-4 hours    Diagnostic tests  last x-rays of left knee where in November 2016 which looked good;     Patient Stated Goals  Increase B LE muscle  strength/ safety with gait    Currently in Pain?  No/denies         Therapeutic Exercise:  Nustep L4, 10 min Seated 3# marching ankle weights - 2x20 Seated LAQ 3# ankle weights - 2x20 each Seated, 3# ankle weights, heel raises - 2x20 B LEs Standing hip abduction, 3# ankle weights - 2x20 B LEs Standing hip extension, 3# ankle weights - 2x20 B LEs Standing hamstring curls, 3# ankle weights - 2x20 B LEs Marching in // bars forward/ backward, 3# ankle weights - x2 Lateral stepping in // bars, 3# ankle weights - x3 (cueing for larger steps; more difficult with stepping to L) Stair tapping alternating B LE, 3# ankle weights - 2x15 (cueing for engaging R glut med to reduce hip drop in R stance)    PT Education - 12/18/18 1504    Education provided  Yes    Education Details   Pt educated on R glut med stabilization to prevent L hip drop during R stance    Person(s) Educated  Patient    Methods  Explanation;Demonstration;Verbal cues    Comprehension  Verbalized understanding;Returned demonstration       PT Short Term Goals - 08/12/18 1236      PT SHORT TERM GOAL #1   Title  Pt. will increase AAROM with ER to 30 degrees in neutral  without increase in pain to regain independent self-care tasks.     Baseline  30 deg. ER on R.      Time  4    Period  Weeks    Status  Achieved    Target Date  06/05/18        PT Long Term Goals - 12/04/18 1717      PT LONG TERM GOAL #1   Title  Pt. will increase FOTO to 56 to improve functional mobility.    Baseline  Initial FOTO: 51; FOTO 57 10/06/2018; Pt FOTO 61 11/06/2018    Time  4    Period  Weeks    Status  Achieved      PT LONG TERM GOAL #2   Title  Pt. will be independent with HEP to increase B LE muscle strength 1/2 muscle grade to improve standing tolerance/ gait.    Baseline  B LE AROM: limited L ankle AROM as compared to R ankle. B LE muscle weakness: hip flexor 4/5 MMT, quads/ hamstring 4+/5 MMT, hip abduction 4+/5 MMT, hip adduction 4/5 MMT, ankle 4/5 MMT except PF 3+/5 MMT.; L/R: Hip flexion 4/5, 4+/5; Knee extension 5/5 B Knee flexion 5/5 B' DF 5/5 B 10/06/2018; Pt LE strength grossly 5/5 except R hip flexoin 4+/5 11/06/2018; Pt B LE strength grossly 5/5 12/04/2018    Time  4    Period  Weeks    Status  Achieved      PT LONG TERM GOAL #3   Title  Pt. will demonstrate improved upright posture for >15 minutes with standing ther.ex.    Baseline  Poor standing posture/ forward head/ rounded sh; Pt maintained improved upright posture for 10 minutes before requiring cuing 10/06/2018; pt able to self correct posture without cuing ~50% of time, demonstrating improved upright posture with standing activities still within still for ~10 min 11/04/2018; Pt requires frequent cuing today for upright posture with standing  exercises (required cuing within <5 min of starting exercises) 12/04/2018    Time  4    Period  Weeks    Status  Partially Met  Target Date  01/01/19      PT LONG TERM GOAL #4   Title  Pt. able to ambulate outside with consistent hip flexion/ heel strike and least restrictive assistive device safely.    Baseline  Pt. ambulates with RW with limited hip flexion/ increase fall risk; Pt with improved heelstrike, but inconsistend hip flexion 10/06/2018; Pt still requires ~60-70% cuing for consistent hip flexion, heel strike and upright posture with RW 11/06/2018; Unable to assess outside d/t weather, however, pt requires cuing for hip flexion/step length/posture during obstacle course with RW approx 50% of time, >70% with hemiwalker 12/04/2018    Time  4    Period  Weeks    Status  Partially Met    Target Date  01/01/19      PT LONG TERM GOAL #5   Title  Pt will ambulate with consistent reciprocal step pattern 100% of the time with minor cuing instead of step-to gait pattern with walker to improve mobility gait mechanics.    Baseline  Pt requires cuing ~80% of the time to increase step length; pt requiring cuing ~50-60% time to increase step length 11/04/2018; pt requires cuing for increased step-length (to avoid step-to pattern) approx 50% of time with RW and >70% with hemiwalker. 12/04/2018    Time  4    Period  Weeks    Status  Partially Met    Target Date  01/01/19      PT LONG TERM GOAL #6   Title  Pt will complete 10MWT with RW to asses pt gait speed    Baseline  Pt has not completed 10MWT    Time  4    Period  Weeks    Status  New            Plan - 12/18/18 1539    Clinical Impression Statement  Pt reports feeling weaker on L LE compared to R with all LE exercises. Pt required cueing for larger steps with lateral stepping in // bars, reporting that it is more difficult but tolerable.  Pt required intermittent cueing for upright posture with standing exercises.  Pt demonstrates L  hip drop in R stance with lateral stepping and alternating stair tapping indicating R glut med weakness; able to correct with verbal cueing and visual feedback from mirror.  Pt will benefit from further skilled therapy to increase B LE strength/ endurance and improve posture/ gait mechanics.    Personal Factors and Comorbidities  Age    Stability/Clinical Decision Making  Evolving/Moderate complexity    Clinical Decision Making  Moderate    Rehab Potential  Good    Clinical Impairments Affecting Rehab Potential  positive: motivation, family support, negative: co-morbidities, deconditioned, high fall risk; Body structures that will need to be addressed: weakness, numbness/tingling, impaired balance, difficulty walking, decreased transfer ability, etc; Due to co-morbidities her clinical presenstation is evolving;     PT Frequency  2x / week    PT Duration  4 weeks    PT Treatment/Interventions  Cryotherapy;Electrical Stimulation;Ultrasound;Moist Heat;Gait training;Functional mobility training;Therapeutic activities;Therapeutic exercise;Neuromuscular re-education;Scar mobilization;Balance training;Stair training;Patient/family education;Manual techniques    PT Next Visit Plan  10MWT, gait with hemiwalker; R glut med stabilization    PT Home Exercise Plan  See prescribed HEP.     Consulted and Agree with Plan of Care  Patient       Patient will benefit from skilled therapeutic intervention in order to improve the following deficits and impairments:  Decreased endurance, Decreased range of  motion, Decreased skin integrity, Decreased strength, Hypomobility, Impaired UE functional use, Increased fascial restricitons, Pain, Postural dysfunction, Impaired flexibility, Decreased scar mobility, Decreased mobility, Decreased balance, Improper body mechanics, Abnormal gait  Visit Diagnosis: Muscle weakness (generalized)  Gait difficulty  Abnormal posture     Problem List Patient Active Problem List    Diagnosis Date Noted  . S/P reverse total shoulder arthroplasty, 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  . Chronic diastolic heart failure ([place]) 08/09/2016  . COPD (chronic obstructive pulmonary disease) ([place]) 08/09/2016  . Pulmonary infiltrates   . Pneumonia due to Streptococcus pneumoniae ([place])   . Fever   . Community acquired pneumonia of right lung   . Palliative care by specialist   . Goals of care, counseling/discussion   . Pressure injury of skin 07/03/2016  . COPD exacerbation ([place]) 07/21/2015  . Acute renal insufficiency 07/21/2015  . Hyperglycemia 07/21/2015  . Benign essential HTN 07/21/2015  . S/P shoulder replacement 07/02/2013   Pura Spice, PT, DPT # [id number] Chinita Greenland, SPT 12/18/2018, 3:42 PM  [facility] [address] [place], [name], [address] Phone: [phone]   Fax:  [phone]  Name: Hannah Powell MRN: [id number] Date of Birth: 01/11/1946

## 2018-12-22 DIAGNOSIS — J9611 Chronic respiratory failure with hypoxia: Secondary | ICD-10-CM | POA: Diagnosis not present

## 2018-12-22 DIAGNOSIS — E038 Other specified hypothyroidism: Secondary | ICD-10-CM | POA: Diagnosis not present

## 2018-12-22 DIAGNOSIS — N183 Chronic kidney disease, stage 3 unspecified: Secondary | ICD-10-CM | POA: Diagnosis not present

## 2018-12-22 DIAGNOSIS — I130 Hypertensive heart and chronic kidney disease with heart failure and stage 1 through stage 4 chronic kidney disease, or unspecified chronic kidney disease: Secondary | ICD-10-CM | POA: Diagnosis not present

## 2018-12-22 DIAGNOSIS — E034 Atrophy of thyroid (acquired): Secondary | ICD-10-CM | POA: Diagnosis not present

## 2018-12-22 DIAGNOSIS — I5032 Chronic diastolic (congestive) heart failure: Secondary | ICD-10-CM | POA: Diagnosis not present

## 2018-12-22 DIAGNOSIS — E7800 Pure hypercholesterolemia, unspecified: Secondary | ICD-10-CM | POA: Diagnosis not present

## 2018-12-22 DIAGNOSIS — J438 Other emphysema: Secondary | ICD-10-CM | POA: Diagnosis not present

## 2018-12-22 DIAGNOSIS — M5136 Other intervertebral disc degeneration, lumbar region: Secondary | ICD-10-CM | POA: Diagnosis not present

## 2018-12-23 ENCOUNTER — Encounter: Payer: Self-pay | Admitting: Physical Therapy

## 2018-12-23 ENCOUNTER — Other Ambulatory Visit: Payer: Self-pay

## 2018-12-23 ENCOUNTER — Ambulatory Visit: Payer: Medicare HMO | Admitting: Physical Therapy

## 2018-12-23 DIAGNOSIS — R293 Abnormal posture: Secondary | ICD-10-CM

## 2018-12-23 DIAGNOSIS — R269 Unspecified abnormalities of gait and mobility: Secondary | ICD-10-CM | POA: Diagnosis not present

## 2018-12-23 DIAGNOSIS — M6281 Muscle weakness (generalized): Secondary | ICD-10-CM | POA: Diagnosis not present

## 2018-12-23 NOTE — Therapy (Signed)
[place] [facility] [address] [place], [name], [address] Phone: [phone]   Fax:  [phone]  Physical Therapy Treatment  Patient Details  Name: Hannah Powell MRN: [id number] Date of Birth: [birth year] Referring Provider (PT): Dr. Ramonita Lab III   Encounter Date: 12/23/2018  PT End of Session - 12/23/18 1156    Visit Number  27    Number of Visits  32    Date for PT Re-Evaluation  01/01/19    Authorization - Visit Number  4    Authorization - Number of Visits  10    PT Start Time  9528    PT Stop Time  1128    PT Time Calculation (min)  44 min    Equipment Utilized During Treatment  Gait belt    Activity Tolerance  Patient tolerated treatment well    Behavior During Therapy  [facility] for tasks assessed/performed       Past Medical History:  Diagnosis Date  . Anxiety   . Arthritis   . CHF (congestive heart failure) ([place])   . COPD (chronic obstructive pulmonary disease) ([place])   . Edema    feet/legs  . Hypertension    dr Otho Najjar     . Hypothyroidism   . Leg weakness, bilateral   . Osteoporosis   . Oxygen deficit    2l with bipap at night  . RLS (restless legs syndrome)   . Shortness of breath   . Sleep apnea    bipap  . Wheezing     Past Surgical History:  Procedure Laterality Date  . BACK SURGERY     cervical  . BREAST BIOPSY Left    needle bx-neg  . CATARACT EXTRACTION W/PHACO Left 05/07/2017   Procedure: CATARACT EXTRACTION PHACO AND INTRAOCULAR LENS PLACEMENT (IOC);  Surgeon: Birder Robson, MD;  Location: [facility];  Service: Ophthalmology;  Laterality: Left;  [place] 00:48.7 AP% 11.3 CDE 5.46 Fluid Pack Lot # [id number]  . CATARACT EXTRACTION W/PHACO Right 04/23/2017   Procedure: CATARACT EXTRACTION PHACO AND INTRAOCULAR LENS PLACEMENT ([place]);  Surgeon: Birder Robson, MD;  Location: [facility];  Service: Ophthalmology;  Laterality: Right;  [place] 00:29 AP% 12.8 CDE 3.79 FLUID PACK LOT # [id number] H  . FOOT  ARTHROPLASTY    . JOINT REPLACEMENT     x2 tkr  . TOTAL SHOULDER ARTHROPLASTY Left 07/02/2013   Procedure: LEFT TOTAL SHOULDER ARTHROPLASTY;  Surgeon: Marin Shutter, MD;  Location: [facility];  Service: Orthopedics;  Laterality: Left;  . TOTAL SHOULDER ARTHROPLASTY Right 04/10/2018   Procedure: TOTAL REVERSE SHOULDER ARTHROPLASTY;  Surgeon: Justice Britain, MD;  Location: [facility];  Service: Orthopedics;  Laterality: Right;  176mn    There were no vitals filed for this visit.  Subjective Assessment - 12/23/18 1106    Subjective  Pt reports no pain today. Pt reports going to the gym 3-4x a week and doing HEP daily.  Pt reports using loftstrand crutches for a 6MWT yesterday at primary care; expressed interest in using loftstrands, felt stable and faster.    Pertinent History  extensive PMH including intensive care unit stays, rehab facility stays, previously ambulated with RW.. COPD, SOB, HTN. Denies CHF, reported she has been cleared by a cardiologist.    Limitations  Walking;Lifting;Standing;Writing;House hold activities    How long can you stand comfortably?  3-4 hours    How long can you walk comfortably?  3-4 hours    Diagnostic tests  last x-rays of left  knee where in November 2016 which looked good;     Patient Stated Goals  Increase B LE muscle strength/ safety with gait    Currently in Pain?  No/denies         Therapeutic Exercise:  Seated marching 4# ankle weights - 2x20 Seated LAQ 4# ankle weights - 2x20 Marching in // bars forward/ backward 4# ankle weights - x4 Lateral stepping in // bars 4# ankle weights - x4 cuing for hip stabilization/ posture Hip extension at 45 degrees - 2x20 B with focus on glut med/ posture Hip hiking on 3.5" step - 2x10 B Outdoor walking on pavement with cueing for increased step length and hip stabilization (expresses difficulty with rolling walker across pavement friction surface)   PT Education - 12/23/18 1155    Education provided  Yes    Education  Details  Pt educated on hip stabilization with LE exercises and gait    Person(s) Educated  Patient    Methods  Explanation;Demonstration;Tactile cues;Verbal cues    Comprehension  Verbalized understanding;Returned demonstration       PT Short Term Goals - 08/12/18 1236      PT SHORT TERM GOAL #1   Title  Pt. will increase AAROM with ER to 30 degrees in neutral  without increase in pain to regain independent self-care tasks.     Baseline  30 deg. ER on R.      Time  4    Period  Weeks    Status  Achieved    Target Date  06/05/18        PT Long Term Goals - 12/04/18 1717      PT LONG TERM GOAL #1   Title  Pt. will increase FOTO to 56 to improve functional mobility.    Baseline  Initial FOTO: 51; FOTO 57 10/06/2018; Pt FOTO 61 11/06/2018    Time  4    Period  Weeks    Status  Achieved      PT LONG TERM GOAL #2   Title  Pt. will be independent with HEP to increase B LE muscle strength 1/2 muscle grade to improve standing tolerance/ gait.    Baseline  B LE AROM: limited L ankle AROM as compared to R ankle. B LE muscle weakness: hip flexor 4/5 MMT, quads/ hamstring 4+/5 MMT, hip abduction 4+/5 MMT, hip adduction 4/5 MMT, ankle 4/5 MMT except PF 3+/5 MMT.; L/R: Hip flexion 4/5, 4+/5; Knee extension 5/5 B Knee flexion 5/5 B' DF 5/5 B 10/06/2018; Pt LE strength grossly 5/5 except R hip flexoin 4+/5 11/06/2018; Pt B LE strength grossly 5/5 12/04/2018    Time  4    Period  Weeks    Status  Achieved      PT LONG TERM GOAL #3   Title  Pt. will demonstrate improved upright posture for >15 minutes with standing ther.ex.    Baseline  Poor standing posture/ forward head/ rounded sh; Pt maintained improved upright posture for 10 minutes before requiring cuing 10/06/2018; pt able to self correct posture without cuing ~50% of time, demonstrating improved upright posture with standing activities still within still for ~10 min 11/04/2018; Pt requires frequent cuing today for upright posture with standing  exercises (required cuing within <5 min of starting exercises) 12/04/2018    Time  4    Period  Weeks    Status  Partially Met    Target Date  01/01/19      PT LONG TERM GOAL #  4   Title  Pt. able to ambulate outside with consistent hip flexion/ heel strike and least restrictive assistive device safely.    Baseline  Pt. ambulates with RW with limited hip flexion/ increase fall risk; Pt with improved heelstrike, but inconsistend hip flexion 10/06/2018; Pt still requires ~60-70% cuing for consistent hip flexion, heel strike and upright posture with RW 11/06/2018; Unable to assess outside d/t weather, however, pt requires cuing for hip flexion/step length/posture during obstacle course with RW approx 50% of time, >70% with hemiwalker 12/04/2018    Time  4    Period  Weeks    Status  Partially Met    Target Date  01/01/19      PT LONG TERM GOAL #5   Title  Pt will ambulate with consistent reciprocal step pattern 100% of the time with minor cuing instead of step-to gait pattern with walker to improve mobility gait mechanics.    Baseline  Pt requires cuing ~80% of the time to increase step length; pt requiring cuing ~50-60% time to increase step length 11/04/2018; pt requires cuing for increased step-length (to avoid step-to pattern) approx 50% of time with RW and >70% with hemiwalker. 12/04/2018    Time  4    Period  Weeks    Status  Partially Met    Target Date  01/01/19      PT LONG TERM GOAL #6   Title  Pt will complete 10MWT with RW to asses pt gait speed    Baseline  Pt has not completed 10MWT    Time  4    Period  Weeks    Status  New            Plan - 12/23/18 1157    Clinical Impression Statement  Pt requires cueing for hip stabilization during ambulation with RW and LE strengthening exercises.  During hip hiking, pt reports R glut med feels weaker compared to L; more difficult to complete and fatigues more quickly with stance on R.  Pt demonstrates progressive increase in B UE WB  during hip extension and hiking exercises.  Pt requires frequent cueing for posture during all exercises; able to correct with cueing.  Pt will benefit from further skilled therapy to increase B LE strength/ endurance and improve posture/ gait mechanics.    Personal Factors and Comorbidities  Age    Stability/Clinical Decision Making  Evolving/Moderate complexity    Clinical Decision Making  Moderate    Rehab Potential  Good    Clinical Impairments Affecting Rehab Potential  positive: motivation, family support, negative: co-morbidities, deconditioned, high fall risk; Body structures that will need to be addressed: weakness, numbness/tingling, impaired balance, difficulty walking, decreased transfer ability, etc; Due to co-morbidities her clinical presenstation is evolving;     PT Frequency  2x / week    PT Duration  4 weeks    PT Treatment/Interventions  Cryotherapy;Electrical Stimulation;Ultrasound;Moist Heat;Gait training;Functional mobility training;Therapeutic activities;Therapeutic exercise;Neuromuscular re-education;Scar mobilization;Balance training;Stair training;Patient/family education;Manual techniques    PT Next Visit Plan  R glut med stabilization; 10MWT, gait with loftstrand, gait with rollator    PT Home Exercise Plan  See prescribed HEP.     Consulted and Agree with Plan of Care  Patient       Patient will benefit from skilled therapeutic intervention in order to improve the following deficits and impairments:  Decreased endurance, Decreased range of motion, Decreased skin integrity, Decreased strength, Hypomobility, Impaired UE functional use, Increased fascial restricitons, Pain, Postural dysfunction,  Impaired flexibility, Decreased scar mobility, Decreased mobility, Decreased balance, Improper body mechanics, Abnormal gait  Visit Diagnosis: Muscle weakness (generalized)  Gait difficulty  Abnormal posture     Problem List Patient Active Problem List   Diagnosis Date  Noted  . S/P reverse total shoulder arthroplasty, 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  . Chronic diastolic heart failure ([place]) 08/09/2016  . COPD (chronic obstructive pulmonary disease) ([place]) 08/09/2016  . Pulmonary infiltrates   . Pneumonia due to Streptococcus pneumoniae ([place])   . Fever   . Community acquired pneumonia of right lung   . Palliative care by specialist   . Goals of care, counseling/discussion   . Pressure injury of skin 07/03/2016  . COPD exacerbation ([place]) 07/21/2015  . Acute renal insufficiency 07/21/2015  . Hyperglycemia 07/21/2015  . Benign essential HTN 07/21/2015  . S/P shoulder replacement 07/02/2013   Pura Spice, PT, DPT # [id number] Chinita Greenland, SPT 12/24/2018, 9:58 AM  [place] [facility] [address] [place], [name], [address] Phone: [phone]   Fax:  [phone]  Name: Hannah Powell MRN: [id number] Date of Birth: Jun 09, 1945

## 2018-12-25 ENCOUNTER — Encounter: Payer: Self-pay | Admitting: Physical Therapy

## 2018-12-25 ENCOUNTER — Ambulatory Visit: Payer: Medicare HMO | Admitting: Physical Therapy

## 2018-12-25 ENCOUNTER — Other Ambulatory Visit: Payer: Self-pay

## 2018-12-25 DIAGNOSIS — M6281 Muscle weakness (generalized): Secondary | ICD-10-CM | POA: Diagnosis not present

## 2018-12-25 DIAGNOSIS — R293 Abnormal posture: Secondary | ICD-10-CM

## 2018-12-25 DIAGNOSIS — R269 Unspecified abnormalities of gait and mobility: Secondary | ICD-10-CM | POA: Diagnosis not present

## 2018-12-25 NOTE — Therapy (Signed)
[facility] [address] [place], [name], [address] Phone: [phone]   Fax:  [phone]  Physical Therapy Treatment  Patient Details  Name: Hannah Powell MRN: [id number] Date of Birth: [birth year] Referring Provider (PT): Dr. Ramonita Lab III   Encounter Date: 12/25/2018  PT End of Session - 12/25/18 1055    Visit Number  28    Number of Visits  32    Date for PT Re-Evaluation  01/01/19    Authorization - Visit Number  4    Authorization - Number of Visits  10    PT Start Time  0955    PT Stop Time  1046    PT Time Calculation (min)  51 min    Equipment Utilized During Treatment  Gait belt    Activity Tolerance  Patient tolerated treatment well    Behavior During Therapy  [facility] for tasks assessed/performed       Past Medical History:  Diagnosis Date  . Anxiety   . Arthritis   . CHF (congestive heart failure) ([place])   . COPD (chronic obstructive pulmonary disease) ([place])   . Edema    feet/legs  . Hypertension    dr Otho Najjar     . Hypothyroidism   . Leg weakness, bilateral   . Osteoporosis   . Oxygen deficit    2l with bipap at night  . RLS (restless legs syndrome)   . Shortness of breath   . Sleep apnea    bipap  . Wheezing     Past Surgical History:  Procedure Laterality Date  . BACK SURGERY     cervical  . BREAST BIOPSY Left    needle bx-neg  . CATARACT EXTRACTION W/PHACO Left 05/07/2017   Procedure: CATARACT EXTRACTION PHACO AND INTRAOCULAR LENS PLACEMENT (IOC);  Surgeon: Birder Robson, MD;  Location: [facility];  Service: Ophthalmology;  Laterality: Left;  [place] 00:48.7 AP% 11.3 CDE 5.46 Fluid Pack Lot # [id number]  . CATARACT EXTRACTION W/PHACO Right 04/23/2017   Procedure: CATARACT EXTRACTION PHACO AND INTRAOCULAR LENS PLACEMENT ([place]);  Surgeon: Birder Robson, MD;  Location: [facility];  Service: Ophthalmology;  Laterality: Right;  [place] 00:29 AP% 12.8 CDE 3.79 FLUID PACK LOT # [id number]  . FOOT  ARTHROPLASTY    . JOINT REPLACEMENT     x2 tkr  . TOTAL SHOULDER ARTHROPLASTY Left 07/02/2013   Procedure: LEFT TOTAL SHOULDER ARTHROPLASTY;  Surgeon: Marin Shutter, MD;  Location: [facility];  Service: Orthopedics;  Laterality: Left;  . TOTAL SHOULDER ARTHROPLASTY Right 04/10/2018   Procedure: TOTAL REVERSE SHOULDER ARTHROPLASTY;  Surgeon: Justice Britain, MD;  Location: [facility];  Service: Orthopedics;  Laterality: Right;  155mn    There were no vitals filed for this visit.  Subjective Assessment - 12/25/18 1054    Subjective  Pt reports no pain today. Pt states she went to the gym yesterday and has been performing HEP daily.    Pertinent History  extensive PMH including intensive care unit stays, rehab facility stays, previously ambulated with RW.. COPD, SOB, HTN. Denies CHF, reported she has been cleared by a cardiologist.    Limitations  Walking;Lifting;Standing;Writing;House hold activities    How long can you stand comfortably?  3-4 hours    How long can you walk comfortably?  3-4 hours    Diagnostic tests  last x-rays of left knee where in November 2016 which looked good;     Patient Stated Goals  Increase B LE muscle strength/  safety with gait    Currently in Pain?  No/denies       Therapeutic Exercise: Nustep L5 x10 min Seated marching 5# ankle weights - 2x20 Seated LAQ 5# ankle weights - 2x20 Seated heel/ toe raises 5# ankle weights - 2x20 each Marching in // bars forward/ backward 5# ankle weights - x4 Lateral walking in // bars 5# ankle weights - x4 Hip extension at 45 degrees 5# ankle weights - 2x20 B with focus on glut med/ posture Hip hiking on 3.5" step 5# ankle weights - 2x10 B Walking in clinic/ outdoors    PT Long Term Goals - 12/04/18 1717      PT LONG TERM GOAL #1   Title  Pt. will increase FOTO to 56 to improve functional mobility.    Baseline  Initial FOTO: 51; FOTO 57 10/06/2018; Pt FOTO 61 11/06/2018    Time  4    Period  Weeks    Status  Achieved      PT  LONG TERM GOAL #2   Title  Pt. will be independent with HEP to increase B LE muscle strength 1/2 muscle grade to improve standing tolerance/ gait.    Baseline  B LE AROM: limited L ankle AROM as compared to R ankle. B LE muscle weakness: hip flexor 4/5 MMT, quads/ hamstring 4+/5 MMT, hip abduction 4+/5 MMT, hip adduction 4/5 MMT, ankle 4/5 MMT except PF 3+/5 MMT.; L/R: Hip flexion 4/5, 4+/5; Knee extension 5/5 B Knee flexion 5/5 B' DF 5/5 B 10/06/2018; Pt LE strength grossly 5/5 except R hip flexoin 4+/5 11/06/2018; Pt B LE strength grossly 5/5 12/04/2018    Time  4    Period  Weeks    Status  Achieved      PT LONG TERM GOAL #3   Title  Pt. will demonstrate improved upright posture for >15 minutes with standing ther.ex.    Baseline  Poor standing posture/ forward head/ rounded sh; Pt maintained improved upright posture for 10 minutes before requiring cuing 10/06/2018; pt able to self correct posture without cuing ~50% of time, demonstrating improved upright posture with standing activities still within still for ~10 min 11/04/2018; Pt requires frequent cuing today for upright posture with standing exercises (required cuing within <5 min of starting exercises) 12/04/2018    Time  4    Period  Weeks    Status  Partially Met    Target Date  01/01/19      PT LONG TERM GOAL #4   Title  Pt. able to ambulate outside with consistent hip flexion/ heel strike and least restrictive assistive device safely.    Baseline  Pt. ambulates with RW with limited hip flexion/ increase fall risk; Pt with improved heelstrike, but inconsistend hip flexion 10/06/2018; Pt still requires ~60-70% cuing for consistent hip flexion, heel strike and upright posture with RW 11/06/2018; Unable to assess outside d/t weather, however, pt requires cuing for hip flexion/step length/posture during obstacle course with RW approx 50% of time, >70% with hemiwalker 12/04/2018    Time  4    Period  Weeks    Status  Partially Met    Target Date   01/01/19      PT LONG TERM GOAL #5   Title  Pt will ambulate with consistent reciprocal step pattern 100% of the time with minor cuing instead of step-to gait pattern with walker to improve mobility gait mechanics.    Baseline  Pt requires cuing ~80% of the time  to increase step length; pt requiring cuing ~50-60% time to increase step length 11/04/2018; pt requires cuing for increased step-length (to avoid step-to pattern) approx 50% of time with RW and >70% with hemiwalker. 12/04/2018    Time  4    Period  Weeks    Status  Partially Met    Target Date  01/01/19      PT LONG TERM GOAL #6   Title  Pt will complete 10MWT with RW to asses pt gait speed    Baseline  Pt has not completed 10MWT    Time  4    Period  Weeks    Status  New            Plan - 12/25/18 1544    Clinical Impression Statement  Pt able to correct posture with gait and LE exercises but requires intermittent cueing to maintain correct posture.  Pt demonstrates L hip drop, and tolerated LE glut strengthening well with increased weight.  Pt will benefit from further skilled therapy to increase B LE strength/ endurance and improve posture/ gait mechanics.    Personal Factors and Comorbidities  Age;Comorbidity 3+    Comorbidities  CPD, HTN, Obesity    Stability/Clinical Decision Making  Evolving/Moderate complexity    Clinical Decision Making  Moderate    Rehab Potential  Good    Clinical Impairments Affecting Rehab Potential  positive: motivation, family support, negative: co-morbidities, deconditioned, high fall risk; Body structures that will need to be addressed: weakness, numbness/tingling, impaired balance, difficulty walking, decreased transfer ability, etc; Due to co-morbidities her clinical presenstation is evolving;     PT Frequency  2x / week    PT Duration  4 weeks    PT Treatment/Interventions  Cryotherapy;Electrical Stimulation;Ultrasound;Moist Heat;Gait training;Functional mobility training;Therapeutic  activities;Therapeutic exercise;Neuromuscular re-education;Scar mobilization;Balance training;Stair training;Patient/family education;Manual techniques    PT Next Visit Plan  R glut med stabilization; gait with loftstrand, gait with rollator; progress LE strength/ endurance, Discuss BEACH TRIP/ RECERT and DISCHARGE.  Pt. is going to [place] at end of month.    PT Home Exercise Plan  See prescribed HEP.     Consulted and Agree with Plan of Care  Patient       Patient will benefit from skilled therapeutic intervention in order to improve the following deficits and impairments:  Decreased endurance, Decreased range of motion, Decreased skin integrity, Decreased strength, Hypomobility, Impaired UE functional use, Increased fascial restricitons, Pain, Postural dysfunction, Impaired flexibility, Decreased scar mobility, Decreased mobility, Decreased balance, Improper body mechanics, Abnormal gait  Visit Diagnosis: Muscle weakness (generalized)  Gait difficulty  Abnormal posture     Problem List Patient Active Problem List   Diagnosis Date Noted  . S/P reverse total shoulder arthroplasty, 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  . Chronic diastolic heart failure ([place]) 08/09/2016  . COPD (chronic obstructive pulmonary disease) ([place]) 08/09/2016  . Pulmonary infiltrates   . Pneumonia due to Streptococcus pneumoniae ([place])   . Fever   . Community acquired pneumonia of right lung   . Palliative care by specialist   . Goals of care, counseling/discussion   . Pressure injury of skin 07/03/2016  . COPD exacerbation ([place]) 07/21/2015  . Acute renal insufficiency 07/21/2015  . Hyperglycemia 07/21/2015  . Benign essential HTN 07/21/2015  . S/P shoulder replacement 07/02/2013   Pura Spice, PT, DPT # [id number] Chinita Greenland, SPT 12/25/2018, 6:11 PM  [facility] [address] [place], [name], [address] Phone: [phone]   Fax:  [phone]  Name: Melissa Tomaselli MRN: [id number] Date of Birth: [birth year]

## 2018-12-30 ENCOUNTER — Ambulatory Visit: Payer: Medicare HMO | Admitting: Physical Therapy

## 2019-01-03 DIAGNOSIS — I509 Heart failure, unspecified: Secondary | ICD-10-CM | POA: Diagnosis not present

## 2019-01-03 DIAGNOSIS — J449 Chronic obstructive pulmonary disease, unspecified: Secondary | ICD-10-CM | POA: Diagnosis not present

## 2019-01-03 DIAGNOSIS — R0689 Other abnormalities of breathing: Secondary | ICD-10-CM | POA: Diagnosis not present

## 2019-01-03 DIAGNOSIS — R0902 Hypoxemia: Secondary | ICD-10-CM | POA: Diagnosis not present

## 2019-01-06 ENCOUNTER — Other Ambulatory Visit: Payer: Self-pay

## 2019-01-06 ENCOUNTER — Encounter: Payer: Self-pay | Admitting: Physical Therapy

## 2019-01-06 ENCOUNTER — Ambulatory Visit: Payer: Medicare HMO

## 2019-01-06 DIAGNOSIS — R269 Unspecified abnormalities of gait and mobility: Secondary | ICD-10-CM | POA: Diagnosis not present

## 2019-01-06 DIAGNOSIS — M6281 Muscle weakness (generalized): Secondary | ICD-10-CM | POA: Diagnosis not present

## 2019-01-06 DIAGNOSIS — R293 Abnormal posture: Secondary | ICD-10-CM | POA: Diagnosis not present

## 2019-01-06 NOTE — Therapy (Addendum)
[facility] [address] [place], [name], [address] Phone: [phone]   Fax:  [phone]  Physical Therapy Treatment and Recertification  Patient Details  Name: Hannah Powell MRN: [id number] Date of Birth: 01/13/1946 Referring Provider (PT): Dr. Ramonita Lab III   Encounter Date: 01/06/2019  PT End of Session - 01/06/19 1724    Visit Number  29    Number of Visits  32    Date for PT Re-Evaluation  01/01/19    Authorization - Visit Number  5    Authorization - Number of Visits  10    PT Start Time  9735    PT Stop Time  1346    PT Time Calculation (min)  38 min    Equipment Utilized During Treatment  Gait belt    Activity Tolerance  Patient tolerated treatment well    Behavior During Therapy  [facility] for tasks assessed/performed       Past Medical History:  Diagnosis Date  . Anxiety   . Arthritis   . CHF (congestive heart failure) ([place])   . COPD (chronic obstructive pulmonary disease) ([place])   . Edema    feet/legs  . Hypertension    dr Otho Najjar     . Hypothyroidism   . Leg weakness, bilateral   . Osteoporosis   . Oxygen deficit    2l with bipap at night  . RLS (restless legs syndrome)   . Shortness of breath   . Sleep apnea    bipap  . Wheezing     Past Surgical History:  Procedure Laterality Date  . BACK SURGERY     cervical  . BREAST BIOPSY Left    needle bx-neg  . CATARACT EXTRACTION W/PHACO Left 05/07/2017   Procedure: CATARACT EXTRACTION PHACO AND INTRAOCULAR LENS PLACEMENT (IOC);  Surgeon: Birder Robson, MD;  Location: [facility];  Service: Ophthalmology;  Laterality: Left;  [place] 00:48.7 AP% 11.3 CDE 5.46 Fluid Pack Lot # [id number]  . CATARACT EXTRACTION W/PHACO Right 04/23/2017   Procedure: CATARACT EXTRACTION PHACO AND INTRAOCULAR LENS PLACEMENT ([place]);  Surgeon: Birder Robson, MD;  Location: [facility];  Service: Ophthalmology;  Laterality: Right;  [place] 00:29 AP% 12.8 CDE 3.79 FLUID PACK LOT #  [id number]  . FOOT ARTHROPLASTY    . JOINT REPLACEMENT     x2 tkr  . TOTAL SHOULDER ARTHROPLASTY Left 07/02/2013   Procedure: LEFT TOTAL SHOULDER ARTHROPLASTY;  Surgeon: [place] Shutter, MD;  Location: [facility];  Service: Orthopedics;  Laterality: Left;  . TOTAL SHOULDER ARTHROPLASTY Right 04/10/2018   Procedure: TOTAL REVERSE SHOULDER ARTHROPLASTY;  Surgeon: Justice Britain, MD;  [facility]: [facility];  Service: Orthopedics;  Laterality: Right;  115mn    There were no vitals filed for this visit.  Subjective Assessment - 01/06/19 1309    Subjective  Pt reports no pain today. Pt reports L ulnar nerve has been irritated.  Pt reports performeding HEP while on vacation; used lofstrand for walking on beach.    Pertinent History  extensive PMH including intensive care unit stays, rehab facility stays, previously ambulated with RW.. COPD, SOB, HTN. Denies CHF, reported she has been cleared by a cardiologist.    Limitations  Walking;Lifting;Standing;Writing;House hold activities    How long can you stand comfortably?  3-4 hours    How long can you walk comfortably?  3-4 hours    Diagnostic tests  last x-rays of left knee where in November 2016 which looked good;  Patient Stated Goals  Increase B LE muscle strength/ safety with gait    Currently in Pain?  No/denies        TREATMENT: Goals assessed 10MWT RW: 0.35 m/s - pt with decreased stride length, decreased hip extension and flexed posture (performed twice with RW) 10MWT Loftstrands: 0.36 m/s - pt with decreased stride length and flexed posture (performed twice with loftstrands) 5xSTS: pt utilized unilateral UE support to rise, no UE to sit in a controlled manner: 23seconds    Therapeutic Exercise: Amb with RW - x1 lap  Amb with loftstrand - x1 lap     PT Short Term Goals - 08/12/18 1236      PT SHORT TERM GOAL #1   Title  Pt. will increase AAROM with ER to 30 degrees in neutral  without increase in pain to regain independent self-care  tasks.     Baseline  30 deg. ER on R.      Time  4    Period  Weeks    Status  Achieved    Target Date  06/05/18        PT Long Term Goals - 01/06/19 1503      PT LONG TERM GOAL #1   Title  Pt. will increase FOTO to 56 to improve functional mobility.    Baseline  Initial FOTO: 51; FOTO 57 10/06/2018; Pt FOTO 61 11/06/2018    Time  4    Period  Weeks    Status  Achieved      PT LONG TERM GOAL #2   Title  Pt. will be independent with HEP to increase B LE muscle strength 1/2 muscle grade to improve standing tolerance/ gait.    Baseline  B LE AROM: limited L ankle AROM as compared to R ankle. B LE muscle weakness: hip flexor 4/5 MMT, quads/ hamstring 4+/5 MMT, hip abduction 4+/5 MMT, hip adduction 4/5 MMT, ankle 4/5 MMT except PF 3+/5 MMT.; L/R: Hip flexion 4/5, 4+/5; Knee extension 5/5 B Knee flexion 5/5 B' DF 5/5 B 10/06/2018; Pt LE strength grossly 5/5 except R hip flexoin 4+/5 11/06/2018; Pt B LE strength grossly 5/5 12/04/2018    Time  4    Period  Weeks    Status  Achieved      PT LONG TERM GOAL #3   Title  Pt. will demonstrate improved upright posture for >15 minutes with standing ther.ex.    Baseline  Poor standing posture/ forward head/ rounded sh; Pt maintained improved upright posture for 10 minutes before requiring cuing 10/06/2018; pt able to self correct posture without cuing ~50% of time, demonstrating improved upright posture with standing activities still within still for ~10 min 11/04/2018; Pt requires frequent cuing today for upright posture with standing exercises (required cuing within <5 min of starting exercises) 12/04/2018; Pt requires frequent cueing for upright posture with standing exercises 01/06/2019    Time  4    Period  Weeks    Status  Not Met    Target Date  01/06/19      PT LONG TERM GOAL #4   Title  Pt. able to ambulate outside with consistent hip flexion/ heel strike and least restrictive assistive device safely.    Baseline  Pt. ambulates with RW with  limited hip flexion/ increase fall risk; Pt with improved heelstrike, but inconsistend hip flexion 10/06/2018; Pt still requires ~60-70% cuing for consistent hip flexion, heel strike and upright posture with RW 11/06/2018; Unable to assess outside d/t  weather, however, pt requires cuing for hip flexion/step length/posture during obstacle course with RW approx 50% of time, >70% with hemiwalker 12/04/2018. Pt able to ambulate outside with consistent heel strike with minimal cueing for increased step length with RW 01/06/2019    Time  4    Period  Weeks    Status  Achieved    Target Date  01/06/19      PT LONG TERM GOAL #5   Title  Pt will ambulate with consistent reciprocal step pattern 100% of the time with minor cuing instead of step-to gait pattern with walker to improve mobility gait mechanics.    Baseline  Pt requires cuing ~80% of the time to increase step length; pt requiring cuing ~50-60% time to increase step length 11/04/2018; pt requires cuing for increased step-length (to avoid step-to pattern) approx 50% of time with RW and >70% with hemiwalker. 12/04/2018. Pt ambulates with reciprocal step through pattern with RW 01/06/2019    Time  4    Period  Weeks    Status  Achieved    Target Date  01/06/19      PT LONG TERM GOAL #6   Title  Pt will complete 10MWT with LAD with gait speed of at least 0.8 m/s for community ambulation.    Baseline  10MWT with RW 0.35 m/s, with lofstrand 0.36 m/s 01/06/2019    Time  6    Period  Weeks    Status  New    Target Date  02/17/19      PT LONG TERM GOAL #7   Title  Pt will perform 5xSTS in at least 15 seconds to demonstrate increased LE power.    Baseline  5xSTS with unilateral UE support to rise 23 seconds 01/06/2019    Time  6    Period  Weeks    Status  New    Target Date  02/17/19              Patient will benefit from skilled therapeutic intervention in order to improve the following deficits and impairments:     Visit  Diagnosis: Muscle weakness (generalized)  Gait difficulty  Abnormal posture     Problem List Patient Active Problem List   Diagnosis Date Noted  . S/P reverse total shoulder arthroplasty, 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  . Chronic diastolic heart failure ([place]) 08/09/2016  . COPD (chronic obstructive pulmonary disease) ([place]) 08/09/2016  . Pulmonary infiltrates   . Pneumonia due to Streptococcus pneumoniae ([place])   . Fever   . Community acquired pneumonia of right lung   . Palliative care by specialist   . Goals of care, counseling/discussion   . Pressure injury of skin 07/03/2016  . COPD exacerbation ([place]) 07/21/2015  . Acute renal insufficiency 07/21/2015  . Hyperglycemia 07/21/2015  . Benign essential HTN 07/21/2015  . S/P shoulder replacement 07/02/2013    Chinita Greenland, SPT 01/06/2019, 5:33 PM  [facility] [address] [place], [name], [address] Phone: [phone]   Fax:  [phone]  Name: Alahna Dunne MRN: [id number] Date of Birth: 24-Jan-1946

## 2019-01-08 ENCOUNTER — Other Ambulatory Visit: Payer: Self-pay

## 2019-01-08 ENCOUNTER — Ambulatory Visit: Payer: Medicare HMO

## 2019-01-08 ENCOUNTER — Encounter: Payer: Self-pay | Admitting: Physical Therapy

## 2019-01-08 DIAGNOSIS — M6281 Muscle weakness (generalized): Secondary | ICD-10-CM

## 2019-01-08 DIAGNOSIS — R293 Abnormal posture: Secondary | ICD-10-CM | POA: Diagnosis not present

## 2019-01-08 DIAGNOSIS — R269 Unspecified abnormalities of gait and mobility: Secondary | ICD-10-CM

## 2019-01-08 NOTE — Therapy (Signed)
[facility] [address] [place], [name], [address] Phone: [phone]   Fax:  [phone]  Physical Therapy Treatment  Patient Details  Name: Hannah Powell MRN: [id number] Date of Birth: 12-28-1945 Referring Provider (PT): Dr. Ramonita Lab III   Encounter Date: 01/08/2019  PT End of Session - 01/08/19 1347    Visit Number  30    Number of Visits  32    Date for PT Re-Evaluation  01/01/19    Authorization - Visit Number  6    Authorization - Number of Visits  10    PT Start Time  8115    PT Stop Time  1430    PT Time Calculation (min)  45 min    Equipment Utilized During Treatment  Gait belt    Activity Tolerance  Patient tolerated treatment well    Behavior During Therapy  [facility] for tasks assessed/performed       Past Medical History:  Diagnosis Date  . Anxiety   . Arthritis   . CHF (congestive heart failure) ([place])   . COPD (chronic obstructive pulmonary disease) ([place])   . Edema    feet/legs  . Hypertension    dr Otho Najjar     . Hypothyroidism   . Leg weakness, bilateral   . Osteoporosis   . Oxygen deficit    2l with bipap at night  . RLS (restless legs syndrome)   . Shortness of breath   . Sleep apnea    bipap  . Wheezing     Past Surgical History:  Procedure Laterality Date  . BACK SURGERY     cervical  . BREAST BIOPSY Left    needle bx-neg  . CATARACT EXTRACTION W/PHACO Left 05/07/2017   Procedure: CATARACT EXTRACTION PHACO AND INTRAOCULAR LENS PLACEMENT (IOC);  Surgeon: Birder Robson, MD;  Location: [facility];  Service: Ophthalmology;  Laterality: Left;  [place] 00:48.7 AP% 11.3 CDE 5.46 Fluid Pack Lot # [id number]  . CATARACT EXTRACTION W/PHACO Right 04/23/2017   Procedure: CATARACT EXTRACTION PHACO AND INTRAOCULAR LENS PLACEMENT ([place]);  Surgeon: Birder Robson, MD;  Location: [facility];  Service: Ophthalmology;  Laterality: Right;  [place] 00:29 AP% 12.8 CDE 3.79 FLUID PACK LOT # [id number]  . FOOT  ARTHROPLASTY    . JOINT REPLACEMENT     x2 tkr  . TOTAL SHOULDER ARTHROPLASTY Left 07/02/2013   Procedure: LEFT TOTAL SHOULDER ARTHROPLASTY;  Surgeon: Marin Shutter, MD;  Location: [facility];  Service: Orthopedics;  Laterality: Left;  . TOTAL SHOULDER ARTHROPLASTY Right 04/10/2018   Procedure: TOTAL REVERSE SHOULDER ARTHROPLASTY;  Surgeon: Justice Britain, MD;  Location: [facility];  Service: Orthopedics;  Laterality: Right;  18mn    There were no vitals filed for this visit.  Subjective Assessment - 01/08/19 1345    Subjective  Patient stated that her back is bothering her today intermittently. She performed her exercises in standing and thats why she thinks that her back is bothering her.    Pertinent History  extensive PMH including intensive care unit stays, rehab facility stays, previously ambulated with RW.. COPD, SOB, HTN. Denies CHF, reported she has been cleared by a cardiologist.    Limitations  Walking;Lifting;Standing;Writing;House hold activities    How long can you sit comfortably?  unlimited    How long can you stand comfortably?  3-4 hours    How long can you walk comfortably?  3-4 hours    Diagnostic tests  last x-rays of left knee where  in November 2016 which looked good;     Patient Stated Goals  Increase B LE muscle strength/ safety with gait    Currently in Pain?  Yes    Pain Score  2     Pain Location  Back    Pain Orientation  Right    Pain Descriptors / Indicators  Aching         TREATMENT:   Therapeutic exercises: nustep >70SPM to increase movement velocity Seated hip flexion, LAQ, heel toe raises 2x30sec ea with 5# ankle weights, focus on velocity of movements Steps taps from chair in forward direction, focus on velocity, cues for knee extension and heel taps 2x30sec bilaterally  Lateral walking in parallel bars x8laps, bilateral UE support Amb with bilateral loftstrand  As well as 1 min standing tolerance at counter, cues for upright posture   Pt  response/clinical impression: The patient reported and demonstrated fatigue with velocity based exercises, intermittent cues to maximize movement and motivation. The patient did demonstrate the ability to improve ambulation velocity with cues after exercises. The patient would benefit from further skilled PT intervention to continue to progress towards goals to maximize mobility and function.    PT Education - 01/08/19 1346    Education provided  Yes    Education Details  posture during gait, quick movements    Person(s) Educated  Patient    Methods  Explanation;Demonstration    Comprehension  Verbalized understanding;Returned demonstration;Verbal cues required;Tactile cues required;Need further instruction       PT Short Term Goals - 08/12/18 1236      PT SHORT TERM GOAL #1   Title  Pt. will increase AAROM with ER to 30 degrees in neutral  without increase in pain to regain independent self-care tasks.     Baseline  30 deg. ER on R.      Time  4    Period  Weeks    Status  Achieved    Target Date  06/05/18        PT Long Term Goals - 01/06/19 1503      PT LONG TERM GOAL #1   Title  Pt. will increase FOTO to 56 to improve functional mobility.    Baseline  Initial FOTO: 51; FOTO 57 10/06/2018; Pt FOTO 61 11/06/2018    Time  4    Period  Weeks    Status  Achieved      PT LONG TERM GOAL #2   Title  Pt. will be independent with HEP to increase B LE muscle strength 1/2 muscle grade to improve standing tolerance/ gait.    Baseline  B LE AROM: limited L ankle AROM as compared to R ankle. B LE muscle weakness: hip flexor 4/5 MMT, quads/ hamstring 4+/5 MMT, hip abduction 4+/5 MMT, hip adduction 4/5 MMT, ankle 4/5 MMT except PF 3+/5 MMT.; L/R: Hip flexion 4/5, 4+/5; Knee extension 5/5 B Knee flexion 5/5 B' DF 5/5 B 10/06/2018; Pt LE strength grossly 5/5 except R hip flexoin 4+/5 11/06/2018; Pt B LE strength grossly 5/5 12/04/2018    Time  4    Period  Weeks    Status  Achieved      PT LONG  TERM GOAL #3   Title  Pt. will demonstrate improved upright posture for >15 minutes with standing ther.ex.    Baseline  Poor standing posture/ forward head/ rounded sh; Pt maintained improved upright posture for 10 minutes before requiring cuing 10/06/2018; pt able to self correct posture  without cuing ~50% of time, demonstrating improved upright posture with standing activities still within still for ~10 min 11/04/2018; Pt requires frequent cuing today for upright posture with standing exercises (required cuing within <5 min of starting exercises) 12/04/2018; Pt requires frequent cueing for upright posture with standing exercises 01/06/2019    Time  4    Period  Weeks    Status  Not Met    Target Date  01/06/19      PT LONG TERM GOAL #4   Title  Pt. able to ambulate outside with consistent hip flexion/ heel strike and least restrictive assistive device safely.    Baseline  Pt. ambulates with RW with limited hip flexion/ increase fall risk; Pt with improved heelstrike, but inconsistend hip flexion 10/06/2018; Pt still requires ~60-70% cuing for consistent hip flexion, heel strike and upright posture with RW 11/06/2018; Unable to assess outside d/t weather, however, pt requires cuing for hip flexion/step length/posture during obstacle course with RW approx 50% of time, >70% with hemiwalker 12/04/2018. Pt able to ambulate outside with consistent heel strike with minimal cueing for increased step length with RW 01/06/2019    Time  4    Period  Weeks    Status  Achieved    Target Date  01/06/19      PT LONG TERM GOAL #5   Title  Pt will ambulate with consistent reciprocal step pattern 100% of the time with minor cuing instead of step-to gait pattern with walker to improve mobility gait mechanics.    Baseline  Pt requires cuing ~80% of the time to increase step length; pt requiring cuing ~50-60% time to increase step length 11/04/2018; pt requires cuing for increased step-length (to avoid step-to pattern)  approx 50% of time with RW and >70% with hemiwalker. 12/04/2018. Pt ambulates with reciprocal step through pattern with RW 01/06/2019    Time  4    Period  Weeks    Status  Achieved    Target Date  01/06/19      PT LONG TERM GOAL #6   Title  Pt will complete 10MWT with LAD with gait speed of at least 0.8 m/s for community ambulation.    Baseline  10MWT with RW 0.35 m/s, with lofstrand 0.36 m/s 01/06/2019    Time  6    Period  Weeks    Status  New    Target Date  02/17/19      PT LONG TERM GOAL #7   Title  Pt will perform 5xSTS in at least 15 seconds to demonstrate increased LE power.    Baseline  5xSTS with unilateral UE support to rise 23 seconds 01/06/2019    Time  6    Period  Weeks    Status  New    Target Date  02/17/19            Plan - 01/08/19 1347    Clinical Impression Statement  The patient reported and demonstrated fatigue with velocity based exercises, intermittent cues to maximize movement and motivation. The patient did demonstrate the ability to improve ambulation velocity with cues after exercises. The patient would benefit from further skilled PT intervention to continue to progress towards goals to maximize mobility and function.    Personal Factors and Comorbidities  Age;Comorbidity 3+    Comorbidities  CPD, HTN, Obesity    Rehab Potential  Good    Clinical Impairments Affecting Rehab Potential  positive: motivation, family support, negative: co-morbidities, deconditioned, high fall risk;  Body structures that will need to be addressed: weakness, numbness/tingling, impaired balance, difficulty walking, decreased transfer ability, etc; Due to co-morbidities her clinical presenstation is evolving;     PT Frequency  2x / week    PT Duration  6 weeks    PT Treatment/Interventions  Cryotherapy;Electrical Stimulation;Ultrasound;Moist Heat;Gait training;Functional mobility training;Therapeutic activities;Therapeutic exercise;Neuromuscular re-education;Scar  mobilization;Balance training;Stair training;Patient/family education;Manual techniques    PT Next Visit Plan  R glut med stabilization; gait with loftstrand, gait with rollator; progress LE strength/ endurance, Discuss [place] TRIP/ RECERT and DISCHARGE.  Pt. is going to [place] at end of month.    PT Home Exercise Plan  See prescribed HEP.     Consulted and Agree with Plan of Care  Patient       Patient will benefit from skilled therapeutic intervention in order to improve the following deficits and impairments:  Decreased endurance, Decreased range of motion, Decreased skin integrity, Decreased strength, Hypomobility, Impaired UE functional use, Increased fascial restricitons, Pain, Postural dysfunction, Impaired flexibility, Decreased scar mobility, Decreased mobility, Decreased balance, Improper body mechanics, Abnormal gait  Visit Diagnosis: Muscle weakness (generalized)  Gait difficulty  Abnormal posture     Problem List Patient Active Problem List   Diagnosis Date Noted  . S/P reverse total shoulder arthroplasty, 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  . Chronic diastolic heart failure ([place]) 08/09/2016  . COPD (chronic obstructive pulmonary disease) ([place]) 08/09/2016  . Pulmonary infiltrates   . Pneumonia due to Streptococcus pneumoniae ([place])   . Fever   . Community acquired pneumonia of right lung   . Palliative care by specialist   . Goals of care, counseling/discussion   . Pressure injury of skin 07/03/2016  . COPD exacerbation ([employer]) 07/21/2015  . Acute renal insufficiency 07/21/2015  . Hyperglycemia 07/21/2015  . Benign essential HTN 07/21/2015  . S/P shoulder replacement 07/02/2013    Lieutenant Diego 01/08/2019, 2:38 PM  [facility] [address] [place], [name], [address] Phone: [phone]   Fax:  [phone]  Name: Hannah Powell MRN: [id number] Date of Birth: 10-16-1945

## 2019-01-20 ENCOUNTER — Ambulatory Visit: Payer: Medicare HMO | Admitting: Physical Therapy

## 2019-01-22 ENCOUNTER — Ambulatory Visit: Payer: Medicare HMO | Admitting: Physical Therapy

## 2019-01-27 ENCOUNTER — Encounter: Payer: Self-pay | Admitting: Physical Therapy

## 2019-01-27 ENCOUNTER — Other Ambulatory Visit: Payer: Self-pay

## 2019-01-27 ENCOUNTER — Ambulatory Visit: Payer: Medicare HMO | Attending: Internal Medicine | Admitting: Physical Therapy

## 2019-01-27 DIAGNOSIS — R269 Unspecified abnormalities of gait and mobility: Secondary | ICD-10-CM | POA: Diagnosis not present

## 2019-01-27 DIAGNOSIS — M6281 Muscle weakness (generalized): Secondary | ICD-10-CM | POA: Insufficient documentation

## 2019-01-27 DIAGNOSIS — R293 Abnormal posture: Secondary | ICD-10-CM | POA: Diagnosis not present

## 2019-01-28 NOTE — Therapy (Signed)
[facility] [address] [place], [name], [address] Phone: [phone]   Fax:  [phone]  Physical Therapy Treatment  Patient Details  Name: Hannah Powell MRN: [id number] Date of Birth: 03-26-1945 Referring Provider (PT): Dr. Ramonita Lab III   Encounter Date: 01/27/2019  PT End of Session - 01/28/19 1628    Visit Number  31    Number of Visits  39    Date for PT Re-Evaluation  02/24/19    Authorization - Visit Number  1    Authorization - Number of Visits  10    PT Start Time  1224    PT Stop Time  1627    PT Time Calculation (min)  56 min    Equipment Utilized During Treatment  Gait belt    Activity Tolerance  Patient tolerated treatment well    Behavior During Therapy  [name] for tasks assessed/performed       Past Medical History:  Diagnosis Date  . Anxiety   . Arthritis   . CHF (congestive heart failure) ([place])   . COPD (chronic obstructive pulmonary disease) ([place])   . Edema    feet/legs  . Hypertension    dr Otho Najjar     . Hypothyroidism   . Leg weakness, bilateral   . Osteoporosis   . Oxygen deficit    2l with bipap at night  . RLS (restless legs syndrome)   . Shortness of breath   . Sleep apnea    bipap  . Wheezing     Past Surgical History:  Procedure Laterality Date  . BACK SURGERY     cervical  . BREAST BIOPSY Left    needle bx-neg  . CATARACT EXTRACTION W/PHACO Left 05/07/2017   Procedure: CATARACT EXTRACTION PHACO AND INTRAOCULAR LENS PLACEMENT (IOC);  Surgeon: Birder Robson, MD;  Location: [facility];  Service: Ophthalmology;  Laterality: Left;  [place] 00:48.7 AP% 11.3 CDE 5.46 Fluid Pack Lot # [id number]  . CATARACT EXTRACTION W/PHACO Right 04/23/2017   Procedure: CATARACT EXTRACTION PHACO AND INTRAOCULAR LENS PLACEMENT ([place]);  Surgeon: Birder Robson, MD;  Location: [facility];  Service: Ophthalmology;  Laterality: Right;  [place] 00:29 AP% 12.8 CDE 3.79 FLUID PACK LOT # [id number]  . FOOT  ARTHROPLASTY    . JOINT REPLACEMENT     x2 tkr  . TOTAL SHOULDER ARTHROPLASTY Left 07/02/2013   Procedure: LEFT TOTAL SHOULDER ARTHROPLASTY;  Surgeon: Marin Shutter, MD;  Location: [facility];  Service: Orthopedics;  Laterality: Left;  . TOTAL SHOULDER ARTHROPLASTY Right 04/10/2018   Procedure: TOTAL REVERSE SHOULDER ARTHROPLASTY;  Surgeon: Justice Britain, MD;  Location: [facility];  Service: Orthopedics;  Laterality: Right;  157mn    There were no vitals filed for this visit.  Subjective Assessment - 01/27/19 1538    Subjective  [place]  [place].  [place]    Pertinent History  extensive PMH including intensive care unit stays, rehab facility stays, previously ambulated with RW.. COPD, SOB, HTN. Denies CHF, reported she has been cleared by a cardiologist.    Limitations  Walking;Lifting;Standing;Writing;House hold activities    How long can you sit comfortably?  unlimited    How long can you stand comfortably?  3-4 hours    How long can you walk comfortably?  3-4 hours    Diagnostic tests  last x-rays of left knee where in November  2016 which looked good;     Patient Stated Goals  Increase B LE muscle strength/ safety with gait    Currently in Pain?  No/denies         [facility] PT Assessment - 01/28/19 0001      Assessment   Medical Diagnosis  Bilateral leg weakness/ Gait difficulty    Referring Provider (PT)  Dr. Ramonita Lab III    Onset Date/Surgical Date  03/19/18      Prior Function   Level of Independence  Requires assistive device for independence      Cognition   Overall Cognitive Status  Within Functional Limits for tasks assessed        TREATMENT:   Therapeutic exercises:  Nustep L4 10 min. B UE/LE Seated hip flexion, LAQ, heel toe raiseswith 5# ankle weights, focus on velocity of movements 20x.  Standing hip ex. (all planes) Steps taps from chair in forward direction, focus on velocity, cues for  knee extension and heel taps 2x30sec bilaterally   Neuro.mm.:  Forward/ backwards/ lateral walking in parallel bars x 4x each, bilateral UE support See updated goals.  5x STS avg.: 28.3 sec.  Amb. To car with use of RW.   Car transfer: difficulty with R hip flexion into drivers seat.  L knee buckled and pt. Requires assist to maintain standing.  Pt. Able to scoot hip back into drivers seat prior to R hip/ knee flexion.     Pt response/clinical impression: The patient reported and demonstrated fatigue with velocity based exercises, intermittent cues to maximize movement and motivation. The patient did demonstrate the ability to improve ambulation velocity with cues after exercises. The patient would benefit from further skilled PT intervention to continue to progress towards goals to maximize mobility and function.   PT Short Term Goals - 08/12/18 1236      PT SHORT TERM GOAL #1   Title  Pt. will increase AAROM with ER to 30 degrees in neutral  without increase in pain to regain independent self-care tasks.     Baseline  30 deg. ER on R.      Time  4    Period  Weeks    Status  Achieved    Target Date  06/05/18        PT Long Term Goals - 01/28/19 1634      PT LONG TERM GOAL #1   Title  Pt. will increase FOTO to 56 to improve functional mobility.    Baseline  Initial FOTO: 51; FOTO 57 10/06/2018; Pt FOTO 61 11/06/2018    Time  4    Period  Weeks    Status  Achieved    Target Date  11/06/18      PT LONG TERM GOAL #2   Title  Pt. will be independent with HEP to increase B LE muscle strength 1/2 muscle grade to improve standing tolerance/ gait.    Baseline  B LE AROM: limited L ankle AROM as compared to R ankle. B LE muscle weakness: hip flexor 4/5 MMT, quads/ hamstring 4+/5 MMT, hip abduction 4+/5 MMT, hip adduction 4/5 MMT, ankle 4/5 MMT except PF 3+/5 MMT.; L/R: Hip flexion 4/5, 4+/5; Knee extension 5/5 B Knee flexion 5/5 B' DF 5/5 B 10/06/2018; Pt LE strength grossly 5/5 except R  hip flexoin 4+/5 11/06/2018; Pt B LE strength grossly 5/5 12/04/2018    Time  4    Period  Weeks    Status  Achieved  Target Date  12/04/18      PT LONG TERM GOAL #3   Title  Pt. will demonstrate improved upright posture for >15 minutes with standing ther.ex.    Baseline  Poor standing posture/ forward head/ rounded sh; Pt maintained improved upright posture for 10 minutes before requiring cuing 10/06/2018; pt able to self correct posture without cuing ~50% of time, demonstrating improved upright posture with standing activities still within still for ~10 min 11/04/2018; Pt requires frequent cuing today for upright posture with standing exercises (required cuing within <5 min of starting exercises) 12/04/2018; Pt requires frequent cueing for upright posture with standing exercises 01/06/2019    Time  4    Period  Weeks    Status  On-going    Target Date  02/24/19      PT LONG TERM GOAL #4   Title  Pt. able to ambulate outside with consistent hip flexion/ heel strike and least restrictive assistive device safely.    Baseline  Pt. ambulates with RW with limited hip flexion/ increase fall risk; Pt with improved heelstrike, but inconsistend hip flexion 10/06/2018; Pt still requires ~60-70% cuing for consistent hip flexion, heel strike and upright posture with RW 11/06/2018; Unable to assess outside d/t weather, however, pt requires cuing for hip flexion/step length/posture during obstacle course with RW approx 50% of time, >70% with hemiwalker 12/04/2018. Pt able to ambulate outside with consistent heel strike with minimal cueing for increased step length with RW 01/06/2019    Time  4    Period  Weeks    Status  Achieved    Target Date  01/06/19      PT LONG TERM GOAL #5   Title  Pt will ambulate with consistent reciprocal step pattern 100% of the time with minor cuing instead of step-to gait pattern with walker to improve mobility gait mechanics.    Baseline  Pt requires cuing ~80% of the time to  increase step length; pt requiring cuing ~50-60% time to increase step length 11/04/2018; pt requires cuing for increased step-length (to avoid step-to pattern) approx 50% of time with RW and >70% with hemiwalker. 12/04/2018. Pt ambulates with reciprocal step through pattern with RW 01/06/2019    Time  4    Period  Weeks    Status  Achieved    Target Date  01/06/19      PT LONG TERM GOAL #6   Title  Pt will complete 10MWT with LAD with gait speed of at least 0.8 m/s for community ambulation.    Baseline  10MWT with RW 0.35 m/s, with lofstrand 0.36 m/s 01/06/2019    Time  4    Period  Weeks    Status  Not Met    Target Date  02/24/19      PT LONG TERM GOAL #7   Title  Pt will perform 5xSTS in at least 15 seconds to demonstrate increased LE power.    Baseline  5xSTS with unilateral UE support to rise 23 seconds 01/06/2019.  11/20: 28.3 seconds.    Time  4    Period  Weeks    Status  Not Met    Target Date  02/24/19            Plan - 01/28/19 1630    Clinical Impression Statement  Pt. had 2 episodes of L knee buckling during backwards walking (L hip extension) and lifting R hip/LE into drivers side of car.  PT assisted pt. into car and  prevented L knee buckling/ fall while pt. trying to lift R hip/LE into car.  5xSTS: 28.3 seconds (1 UE assist required).  Pt. works hard during PT tx. session to increase LE muscle strengthening/ upright posture/ independence with functional mobility.  Pt. benefits from use of RW with all aspects of walking, esp. while descending ramp to car.  See updated goals.    Personal Factors and Comorbidities  Age;Comorbidity 3+    Comorbidities  CPD, HTN, Obesity    Stability/Clinical Decision Making  Evolving/Moderate complexity    Clinical Decision Making  Moderate    Rehab Potential  Good    Clinical Impairments Affecting Rehab Potential  positive: motivation, family support, negative: co-morbidities, deconditioned, high fall risk; Body structures that will  need to be addressed: weakness, numbness/tingling, impaired balance, difficulty walking, decreased transfer ability, etc; Due to co-morbidities her clinical presenstation is evolving;     PT Frequency  2x / week    PT Duration  4 weeks    PT Treatment/Interventions  Cryotherapy;Electrical Stimulation;Ultrasound;Moist Heat;Gait training;Functional mobility training;Therapeutic activities;Therapeutic exercise;Neuromuscular re-education;Scar mobilization;Balance training;Stair training;Patient/family education;Manual techniques    PT Next Visit Plan  R glut med stabilization; gait with loftstrand, gait with rollator; progress LE strength/ endurance,    PT Home Exercise Plan  See prescribed HEP.     Consulted and Agree with Plan of Care  Patient       Patient will benefit from skilled therapeutic intervention in order to improve the following deficits and impairments:  Decreased endurance, Decreased range of motion, Decreased skin integrity, Decreased strength, Hypomobility, Impaired UE functional use, Increased fascial restricitons, Pain, Postural dysfunction, Impaired flexibility, Decreased scar mobility, Decreased mobility, Decreased balance, Improper body mechanics, Abnormal gait  Visit Diagnosis: Muscle weakness (generalized)  Gait difficulty  Abnormal posture     Problem List Patient Active Problem List   Diagnosis Date Noted  . S/P reverse total shoulder arthroplasty, 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  . Chronic diastolic heart failure ([place]) 08/09/2016  . COPD (chronic obstructive pulmonary disease) ([place]) 08/09/2016  . Pulmonary infiltrates   . Pneumonia due to Streptococcus pneumoniae ([place])   . Fever   . Community acquired pneumonia of right lung   . Palliative care by specialist   . Goals of care, counseling/discussion   . Pressure injury of skin 07/03/2016  . COPD exacerbation ([place]) 07/21/2015  . Acute renal insufficiency 07/21/2015  . Hyperglycemia 07/21/2015  . Benign essential HTN  07/21/2015  . S/P shoulder replacement 07/02/2013   Pura Spice, PT, DPT # [id number] 01/28/2019, 4:38 PM  [facility] [address] [place], [name], [address] Phone: [phone]   Fax:  [phone]  Name: Hannah Powell MRN: [id number] Date of Birth: 05-Sep-1945

## 2019-01-29 ENCOUNTER — Ambulatory Visit: Payer: Medicare HMO | Admitting: Physical Therapy

## 2019-01-29 ENCOUNTER — Encounter: Payer: Self-pay | Admitting: Physical Therapy

## 2019-01-29 ENCOUNTER — Other Ambulatory Visit: Payer: Self-pay

## 2019-01-29 DIAGNOSIS — R269 Unspecified abnormalities of gait and mobility: Secondary | ICD-10-CM

## 2019-01-29 DIAGNOSIS — M6281 Muscle weakness (generalized): Secondary | ICD-10-CM

## 2019-01-29 DIAGNOSIS — R293 Abnormal posture: Secondary | ICD-10-CM | POA: Diagnosis not present

## 2019-01-29 NOTE — Therapy (Signed)
[facility] [address] [place], [name], [address] Phone: [phone]   Fax:  [phone]  Physical Therapy Treatment  Patient Details  Name: Hannah Powell MRN: [id number] Date of Birth: 08-22-1945 Referring Provider (PT): Dr. Ramonita Lab III   Encounter Date: 01/29/2019  PT End of Session - 01/30/19 1925    Visit Number  32    Number of Visits  39    Date for PT Re-Evaluation  02/24/19    Authorization - Visit Number  2    Authorization - Number of Visits  10    PT Start Time  8185    PT Stop Time  1350    PT Time Calculation (min)  53 min    Equipment Utilized During Treatment  Gait belt    Activity Tolerance  Patient tolerated treatment well    Behavior During Therapy  [facility] for tasks assessed/performed       Past Medical History:  Diagnosis Date  . Anxiety   . Arthritis   . CHF (congestive heart failure) ([place])   . COPD (chronic obstructive pulmonary disease) ([place])   . Edema    feet/legs  . Hypertension    dr Otho Najjar     . Hypothyroidism   . Leg weakness, bilateral   . Osteoporosis   . Oxygen deficit    2l with bipap at night  . RLS (restless legs syndrome)   . Shortness of breath   . Sleep apnea    bipap  . Wheezing     Past Surgical History:  Procedure Laterality Date  . BACK SURGERY     cervical  . BREAST BIOPSY Left    needle bx-neg  . CATARACT EXTRACTION W/PHACO Left 05/07/2017   Procedure: CATARACT EXTRACTION PHACO AND INTRAOCULAR LENS PLACEMENT (IOC);  Surgeon: Birder Robson, MD;  Location: [facility];  Service: Ophthalmology;  Laterality: Left;  [place] 00:48.7 AP% 11.3 CDE 5.46 Fluid Pack Lot # [id number]  . CATARACT EXTRACTION W/PHACO Right 04/23/2017   Procedure: CATARACT EXTRACTION PHACO AND INTRAOCULAR LENS PLACEMENT ([place]);  Surgeon: Birder Robson, MD;  Location: [facility];  Service: Ophthalmology;  Laterality: Right;  [place] 00:29 AP% 12.8 CDE 3.79 FLUID PACK LOT # [id number] H  . FOOT  ARTHROPLASTY    . JOINT REPLACEMENT     x2 tkr  . TOTAL SHOULDER ARTHROPLASTY Left 07/02/2013   Procedure: LEFT TOTAL SHOULDER ARTHROPLASTY;  Surgeon: Marin Shutter, MD;  Location: [facility];  Service: Orthopedics;  Laterality: Left;  . TOTAL SHOULDER ARTHROPLASTY Right 04/10/2018   Procedure: TOTAL REVERSE SHOULDER ARTHROPLASTY;  Surgeon: Justice Britain, MD;  Location[facility];  Service: Orthopedics;  Laterality: Right;  173mn    There were no vitals filed for this visit.  Subjective Assessment - 01/29/19 1252    Subjective  Pt. reports concern with inability to lift R LE into drivers side of car during last tx. session.  Pt. states she went to pool yesterday and did ex. program.  Pt. does have difficulty getting out of pool stairs due to increase height.  Pt. required chair lift to get out.    Patient is accompained by:  Family member    Pertinent History  extensive PMH including intensive care unit stays, rehab facility stays, previously ambulated with RW.. COPD, SOB, HTN. Denies CHF, reported she has been cleared by a cardiologist.    Limitations  Walking;Lifting;Standing;Writing;House hold activities    How long can you sit comfortably?  unlimited  How long can you stand comfortably?  3-4 hours    How long can you walk comfortably?  3-4 hours    Diagnostic tests  last x-rays of left knee where in November 2016 which looked good;     Patient Stated Goals  Increase B LE muscle strength/ safety with gait    Currently in Pain?  No/denies         Therapeutic exercises:  Nustep L4 10 min. B UE/LE Supine SLR/ knee to chest/ hip adduction with ball/ bridging 20x each Seated hip flexion, LAQ, heel toe raises/ marching with 5# ankle weights, 20x. Standing hip ex. (all planes)- no weight but decrease UE assist.   Steps taps (3"/6" step) in forward direction cues for knee extension and heel taps 2x30sec bilaterally   (L hip flexor muscle weakness/ fatigue)  Neuro.mm.:  Stairs with step  to pattern/ side stepping with descending Walking in //-bars with step overs (3") 5x.   Discussed getting in/out of car.     PT Short Term Goals - 08/12/18 1236      PT SHORT TERM GOAL #1   Title  Pt. will increase AAROM with ER to 30 degrees in neutral  without increase in pain to regain independent self-care tasks.     Baseline  30 deg. ER on R.      Time  4    Period  Weeks    Status  Achieved    Target Date  06/05/18        PT Long Term Goals - 01/28/19 1634      PT LONG TERM GOAL #1   Title  Pt. will increase FOTO to 56 to improve functional mobility.    Baseline  Initial FOTO: 51; FOTO 57 10/06/2018; Pt FOTO 61 11/06/2018    Time  4    Period  Weeks    Status  Achieved    Target Date  11/06/18      PT LONG TERM GOAL #2   Title  Pt. will be independent with HEP to increase B LE muscle strength 1/2 muscle grade to improve standing tolerance/ gait.    Baseline  B LE AROM: limited L ankle AROM as compared to R ankle. B LE muscle weakness: hip flexor 4/5 MMT, quads/ hamstring 4+/5 MMT, hip abduction 4+/5 MMT, hip adduction 4/5 MMT, ankle 4/5 MMT except PF 3+/5 MMT.; L/R: Hip flexion 4/5, 4+/5; Knee extension 5/5 B Knee flexion 5/5 B' DF 5/5 B 10/06/2018; Pt LE strength grossly 5/5 except R hip flexoin 4+/5 11/06/2018; Pt B LE strength grossly 5/5 12/04/2018    Time  4    Period  Weeks    Status  Achieved    Target Date  12/04/18      PT LONG TERM GOAL #3   Title  Pt. will demonstrate improved upright posture for >15 minutes with standing ther.ex.    Baseline  Poor standing posture/ forward head/ rounded sh; Pt maintained improved upright posture for 10 minutes before requiring cuing 10/06/2018; pt able to self correct posture without cuing ~50% of time, demonstrating improved upright posture with standing activities still within still for ~10 min 11/04/2018; Pt requires frequent cuing today for upright posture with standing exercises (required cuing within <5 min of starting  exercises) 12/04/2018; Pt requires frequent cueing for upright posture with standing exercises 01/06/2019    Time  4    Period  Weeks    Status  On-going    Target Date  02/24/19      PT LONG TERM GOAL #4   Title  Pt. able to ambulate outside with consistent hip flexion/ heel strike and least restrictive assistive device safely.    Baseline  Pt. ambulates with RW with limited hip flexion/ increase fall risk; Pt with improved heelstrike, but inconsistend hip flexion 10/06/2018; Pt still requires ~60-70% cuing for consistent hip flexion, heel strike and upright posture with RW 11/06/2018; Unable to assess outside d/t weather, however, pt requires cuing for hip flexion/step length/posture during obstacle course with RW approx 50% of time, >70% with hemiwalker 12/04/2018. Pt able to ambulate outside with consistent heel strike with minimal cueing for increased step length with RW 01/06/2019    Time  4    Period  Weeks    Status  Achieved    Target Date  01/06/19      PT LONG TERM GOAL #5   Title  Pt will ambulate with consistent reciprocal step pattern 100% of the time with minor cuing instead of step-to gait pattern with walker to improve mobility gait mechanics.    Baseline  Pt requires cuing ~80% of the time to increase step length; pt requiring cuing ~50-60% time to increase step length 11/04/2018; pt requires cuing for increased step-length (to avoid step-to pattern) approx 50% of time with RW and >70% with hemiwalker. 12/04/2018. Pt ambulates with reciprocal step through pattern with RW 01/06/2019    Time  4    Period  Weeks    Status  Achieved    Target Date  01/06/19      PT LONG TERM GOAL #6   Title  Pt will complete 10MWT with LAD with gait speed of at least 0.8 m/s for community ambulation.    Baseline  10MWT with RW 0.35 m/s, with lofstrand 0.36 m/s 01/06/2019    Time  4    Period  Weeks    Status  Not Met    Target Date  02/24/19      PT LONG TERM GOAL #7   Title  Pt will perform  5xSTS in at least 15 seconds to demonstrate increased LE power.    Baseline  5xSTS with unilateral UE support to rise 23 seconds 01/06/2019.  11/20: 28.3 seconds.    Time  4    Period  Weeks    Status  Not Met    Target Date  02/24/19            Plan - 01/30/19 1931    Clinical Impression Statement  No episodes of L knee buckling during step ups/ touches.  Pt. does requires UE assist with all step touches for safety.  Pt. unable to complete any step touches >6 inches.  PT discussed importance of upright posture to increase hip flexion/ step ups into car.  Pt. prefers flexed posture with all standing/ walking tasks and moderate cuing to correct t/o tx. session.  No c/o pain during tx. session and good endurance with use of Nustep (no rest breaks).    Personal Factors and Comorbidities  Age;Comorbidity 3+    Comorbidities  CPD, HTN, Obesity    Stability/Clinical Decision Making  Evolving/Moderate complexity    Clinical Decision Making  Moderate    Rehab Potential  Good    Clinical Impairments Affecting Rehab Potential  positive: motivation, family support, negative: co-morbidities, deconditioned, high fall risk; Body structures that will need to be addressed: weakness, numbness/tingling, impaired balance, difficulty walking, decreased transfer ability, etc; Due to co-morbidities  her clinical presenstation is evolving;     PT Frequency  2x / week    PT Duration  4 weeks    PT Treatment/Interventions  Cryotherapy;Electrical Stimulation;Ultrasound;Moist Heat;Gait training;Functional mobility training;Therapeutic activities;Therapeutic exercise;Neuromuscular re-education;Scar mobilization;Balance training;Stair training;Patient/family education;Manual techniques    PT Next Visit Plan  R glut med stabilization; progress LE strength/ endurance,    PT Home Exercise Plan  See prescribed HEP.     Consulted and Agree with Plan of Care  Patient       Patient will benefit from skilled therapeutic  intervention in order to improve the following deficits and impairments:  Decreased endurance, Decreased range of motion, Decreased skin integrity, Decreased strength, Hypomobility, Impaired UE functional use, Increased fascial restricitons, Pain, Postural dysfunction, Impaired flexibility, Decreased scar mobility, Decreased mobility, Decreased balance, Improper body mechanics, Abnormal gait  Visit Diagnosis: Muscle weakness (generalized)  Gait difficulty  Abnormal posture     Problem List Patient Active Problem List   Diagnosis Date Noted  . S/P reverse total shoulder arthroplasty, 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  . Chronic diastolic heart failure ([place]) 08/09/2016  . COPD (chronic obstructive pulmonary disease) ([place]) 08/09/2016  . Pulmonary infiltrates   . Pneumonia due to Streptococcus pneumoniae ([place])   . Fever   . Community acquired pneumonia of right lung   . Palliative care by specialist   . Goals of care, counseling/discussion   . Pressure injury of skin 07/03/2016  . COPD exacerbation ([place]) 07/21/2015  . Acute renal insufficiency 07/21/2015  . Hyperglycemia 07/21/2015  . Benign essential HTN 07/21/2015  . S/P shoulder replacement 07/02/2013   Pura Spice, PT, DPT # [phone] 01/30/2019, 7:41 PM  [facility] [address] [place], [name], [address] Phone: [phone]   Fax:  [phone]  Name: Zailah Zagami MRN: [id number] Date of Birth: 27-Jul-1945

## 2019-02-03 ENCOUNTER — Other Ambulatory Visit: Payer: Self-pay

## 2019-02-03 ENCOUNTER — Encounter: Payer: Self-pay | Admitting: Physical Therapy

## 2019-02-03 ENCOUNTER — Ambulatory Visit: Payer: Medicare HMO | Admitting: Physical Therapy

## 2019-02-03 DIAGNOSIS — I5032 Chronic diastolic (congestive) heart failure: Secondary | ICD-10-CM | POA: Diagnosis not present

## 2019-02-03 DIAGNOSIS — R269 Unspecified abnormalities of gait and mobility: Secondary | ICD-10-CM

## 2019-02-03 DIAGNOSIS — J449 Chronic obstructive pulmonary disease, unspecified: Secondary | ICD-10-CM | POA: Diagnosis not present

## 2019-02-03 DIAGNOSIS — M6281 Muscle weakness (generalized): Secondary | ICD-10-CM | POA: Diagnosis not present

## 2019-02-03 DIAGNOSIS — J441 Chronic obstructive pulmonary disease with (acute) exacerbation: Secondary | ICD-10-CM | POA: Diagnosis not present

## 2019-02-03 DIAGNOSIS — R293 Abnormal posture: Secondary | ICD-10-CM | POA: Diagnosis not present

## 2019-02-03 NOTE — Therapy (Signed)
[facility] [address] [place], [name], [address] Phone: [phone]   Fax:  [phone]  Physical Therapy Treatment  Patient Details  Name: Hannah Powell MRN: [id number] Date of Birth: Sep 27, 1945 Referring Provider (PT): Dr. Ramonita Lab III   Encounter Date: 02/03/2019    Treatment: 33 of 55.  Recert date: 15/03/7614 1248 to 1346   Past Medical History:  Diagnosis Date  . Anxiety   . Arthritis   . CHF (congestive heart failure) ([place])   . COPD (chronic obstructive pulmonary disease) ([place])   . Edema    feet/legs  . Hypertension    dr Otho Najjar     . Hypothyroidism   . Leg weakness, bilateral   . Osteoporosis   . Oxygen deficit    2l with bipap at night  . RLS (restless legs syndrome)   . Shortness of breath   . Sleep apnea    bipap  . Wheezing     Past Surgical History:  Procedure Laterality Date  . BACK SURGERY     cervical  . BREAST BIOPSY Left    needle bx-neg  . CATARACT EXTRACTION W/PHACO Left 05/07/2017   Procedure: CATARACT EXTRACTION PHACO AND INTRAOCULAR LENS PLACEMENT (IOC);  Surgeon: Birder Robson, MD;  Location: [facility];  Service: Ophthalmology;  Laterality: Left;  [place] 00:48.7 AP% 11.3 CDE 5.46 Fluid Pack Lot # [id number]  . CATARACT EXTRACTION W/PHACO Right 04/23/2017   Procedure: CATARACT EXTRACTION PHACO AND INTRAOCULAR LENS PLACEMENT ([place]);  Surgeon: Birder Robson, MD;  Location: [facility];  Service: Ophthalmology;  Laterality: Right;  [place] 00:29 AP% 12.8 CDE 3.79 FLUID PACK LOT # [id number]  . FOOT ARTHROPLASTY    . JOINT REPLACEMENT     x2 tkr  . TOTAL SHOULDER ARTHROPLASTY Left 07/02/2013   Procedure: LEFT TOTAL SHOULDER ARTHROPLASTY;  Surgeon: Marin Shutter, MD;  Location: [facility];  Service: Orthopedics;  Laterality: Left;  . TOTAL SHOULDER ARTHROPLASTY Right 04/10/2018   Procedure: TOTAL REVERSE SHOULDER ARTHROPLASTY;  Surgeon: Justice Britain, MD;  [facility];  Service:  Orthopedics;  Laterality: Right;  14mn    There were no vitals filed for this visit.      No pain reported today. Pt. discussed returning to trainer (Pilar Plate   Therapeutic exercises:  Seated hip flexion, LAQ, heel toe raises/ marching with 5# ankle weights, 20x. Standing hip ex. (all planes)- 5# ankle wt. 20x each.   Partial lunge walking 10x4 Standing cone/ step touches in //-bars NustepL5 10 min. B UE/LE  Neuro.mm.:  Standing/ seated posture correction with upright head position/ scapular retraction Walking in //-bars with 1 UE assist over blue mat pad (forward/ lateral)- 4x each.  Cuing to increase hip flexion/ step length.     Walking in //-bars with step overs (3") 5x. Stairs with step to pattern/ side stepping with descending     PT Short Term Goals - 08/12/18 1236      PT SHORT TERM GOAL #1   Title  Pt. will increase AAROM with ER to 30 degrees in neutral  without increase in pain to regain independent self-care tasks.     Baseline  30 deg. ER on R.      Time  4    Period  Weeks    Status  Achieved    Target Date  06/05/18        PT Long Term Goals - 01/28/19 1634      PT LONG TERM  GOAL #1   Title  Pt. will increase FOTO to 56 to improve functional mobility.    Baseline  Initial FOTO: 51; FOTO 57 10/06/2018; Pt FOTO 61 11/06/2018    Time  4    Period  Weeks    Status  Achieved    Target Date  11/06/18      PT LONG TERM GOAL #2   Title  Pt. will be independent with HEP to increase B LE muscle strength 1/2 muscle grade to improve standing tolerance/ gait.    Baseline  B LE AROM: limited L ankle AROM as compared to R ankle. B LE muscle weakness: hip flexor 4/5 MMT, quads/ hamstring 4+/5 MMT, hip abduction 4+/5 MMT, hip adduction 4/5 MMT, ankle 4/5 MMT except PF 3+/5 MMT.; L/R: Hip flexion 4/5, 4+/5; Knee extension 5/5 B Knee flexion 5/5 B' DF 5/5 B 10/06/2018; Pt LE strength grossly 5/5 except R hip flexoin 4+/5 11/06/2018; Pt B LE strength grossly 5/5  12/04/2018    Time  4    Period  Weeks    Status  Achieved    Target Date  12/04/18      PT LONG TERM GOAL #3   Title  Pt. will demonstrate improved upright posture for >15 minutes with standing ther.ex.    Baseline  Poor standing posture/ forward head/ rounded sh; Pt maintained improved upright posture for 10 minutes before requiring cuing 10/06/2018; pt able to self correct posture without cuing ~50% of time, demonstrating improved upright posture with standing activities still within still for ~10 min 11/04/2018; Pt requires frequent cuing today for upright posture with standing exercises (required cuing within <5 min of starting exercises) 12/04/2018; Pt requires frequent cueing for upright posture with standing exercises 01/06/2019    Time  4    Period  Weeks    Status  On-going    Target Date  02/24/19      PT LONG TERM GOAL #4   Title  Pt. able to ambulate outside with consistent hip flexion/ heel strike and least restrictive assistive device safely.    Baseline  Pt. ambulates with RW with limited hip flexion/ increase fall risk; Pt with improved heelstrike, but inconsistend hip flexion 10/06/2018; Pt still requires ~60-70% cuing for consistent hip flexion, heel strike and upright posture with RW 11/06/2018; Unable to assess outside d/t weather, however, pt requires cuing for hip flexion/step length/posture during obstacle course with RW approx 50% of time, >70% with hemiwalker 12/04/2018. Pt able to ambulate outside with consistent heel strike with minimal cueing for increased step length with RW 01/06/2019    Time  4    Period  Weeks    Status  Achieved    Target Date  01/06/19      PT LONG TERM GOAL #5   Title  Pt will ambulate with consistent reciprocal step pattern 100% of the time with minor cuing instead of step-to gait pattern with walker to improve mobility gait mechanics.    Baseline  Pt requires cuing ~80% of the time to increase step length; pt requiring cuing ~50-60% time to  increase step length 11/04/2018; pt requires cuing for increased step-length (to avoid step-to pattern) approx 50% of time with RW and >70% with hemiwalker. 12/04/2018. Pt ambulates with reciprocal step through pattern with RW 01/06/2019    Time  4    Period  Weeks    Status  Achieved    Target Date  01/06/19      PT  LONG TERM GOAL #6   Title  Pt will complete 10MWT with LAD with gait speed of at least 0.8 m/s for community ambulation.    Baseline  10MWT with RW 0.35 m/s, with lofstrand 0.36 m/s 01/06/2019    Time  4    Period  Weeks    Status  Not Met    Target Date  02/24/19      PT LONG TERM GOAL #7   Title  Pt will perform 5xSTS in at least 15 seconds to demonstrate increased LE power.    Baseline  5xSTS with unilateral UE support to rise 23 seconds 01/06/2019.  11/20: 28.3 seconds.    Time  4    Period  Weeks    Status  Not Met    Target Date  02/24/19         Pt. remains limited with standing R knee flexion/ step ups >8 inches due to muscle weakness and L knee buckling with prolonged standing. PT focused tx. on B hip strengthening/ car transfers/ safety. Pts. husband present during tx. session. Moderate cuing t/o tx. to complete standing ther.ex./ gait in clinic due to flexed posture/ down head position. Pt. fatigued with standing LE therex. and unable to complete step ups/ downs in //-bars with UE assist.      Patient will benefit from skilled therapeutic intervention in order to improve the following deficits and impairments:  Decreased endurance, Decreased range of motion, Decreased skin integrity, Decreased strength, Hypomobility, Impaired UE functional use, Increased fascial restricitons, Pain, Postural dysfunction, Impaired flexibility, Decreased scar mobility, Decreased mobility, Decreased balance, Improper body mechanics, Abnormal gait  Visit Diagnosis: Muscle weakness (generalized)  Gait difficulty  Abnormal posture     Problem List Patient Active Problem List    Diagnosis Date Noted  . S/P reverse total shoulder arthroplasty, 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  . Chronic diastolic heart failure ([place]) 08/09/2016  . COPD (chronic obstructive pulmonary disease) ([place]) 08/09/2016  . Pulmonary infiltrates   . Pneumonia due to Streptococcus pneumoniae ([place])   . Fever   . Community acquired pneumonia of right lung   . Palliative care by specialist   . Goals of care, counseling/discussion   . Pressure injury of skin 07/03/2016  . COPD exacerbation ([place]) 07/21/2015  . Acute renal insufficiency 07/21/2015  . Hyperglycemia 07/21/2015  . Benign essential HTN 07/21/2015  . S/P shoulder replacement 07/02/2013   Pura Spice, PT, DPT # [phone] 02/06/2019, 2:27 PM  [facility] [address] [place], [name], [address] Phone: [phone]   Fax:  [phone]  Name: Hannah Powell MRN: [id number] Date of Birth: [birth year]

## 2019-02-05 ENCOUNTER — Encounter: Payer: Self-pay | Admitting: Physical Therapy

## 2019-02-05 ENCOUNTER — Ambulatory Visit: Payer: Medicare HMO | Admitting: Physical Therapy

## 2019-02-05 ENCOUNTER — Other Ambulatory Visit: Payer: Self-pay

## 2019-02-05 DIAGNOSIS — R293 Abnormal posture: Secondary | ICD-10-CM

## 2019-02-05 DIAGNOSIS — R269 Unspecified abnormalities of gait and mobility: Secondary | ICD-10-CM

## 2019-02-05 DIAGNOSIS — M6281 Muscle weakness (generalized): Secondary | ICD-10-CM

## 2019-02-05 NOTE — Therapy (Signed)
[facility] [address] [place], [name], [address] Phone: [phone]   Fax:  [phone]  Physical Therapy Treatment  Patient Details  Name: Hannah Powell MRN: [id number] Date of Birth: Mar 04, 1946 Referring Provider (PT): Dr. Ramonita Lab III   Encounter Date: 02/05/2019  PT End of Session - 02/05/19 1301    Visit Number  34    Number of Visits  39    Date for PT Re-Evaluation  02/24/19    Authorization - Visit Number  4    Authorization - Number of Visits  10    PT Start Time  1251    PT Stop Time  1345    PT Time Calculation (min)  54 min    Equipment Utilized During Treatment  Gait belt    Activity Tolerance  Patient tolerated treatment well    Behavior During Therapy  [facility] for tasks assessed/performed       Past Medical History:  Diagnosis Date  . Anxiety   . Arthritis   . CHF (congestive heart failure) ([place])   . COPD (chronic obstructive pulmonary disease) ([place])   . Edema    feet/legs  . Hypertension    dr Otho Najjar     . Hypothyroidism   . Leg weakness, bilateral   . Osteoporosis   . Oxygen deficit    2l with bipap at night  . RLS (restless legs syndrome)   . Shortness of breath   . Sleep apnea    bipap  . Wheezing     Past Surgical History:  Procedure Laterality Date  . BACK SURGERY     cervical  . BREAST BIOPSY Left    needle bx-neg  . CATARACT EXTRACTION W/PHACO Left 05/07/2017   Procedure: CATARACT EXTRACTION PHACO AND INTRAOCULAR LENS PLACEMENT (IOC);  Surgeon: Birder Robson, MD;  Location: [facility];  Service: Ophthalmology;  Laterality: Left;  [place] 00:48.7 AP% 11.3 CDE 5.46 Fluid Pack Lot # [id number]  . CATARACT EXTRACTION W/PHACO Right 04/23/2017   Procedure: CATARACT EXTRACTION PHACO AND INTRAOCULAR LENS PLACEMENT ([place]);  Surgeon: Birder Robson, MD;  Location: [facility];  Service: Ophthalmology;  Laterality: Right;  [place] 00:29 AP% 12.8 CDE 3.79 FLUID PACK LOT # [id number]  . FOOT  ARTHROPLASTY    . JOINT REPLACEMENT     x2 tkr  . TOTAL SHOULDER ARTHROPLASTY Left 07/02/2013   Procedure: LEFT TOTAL SHOULDER ARTHROPLASTY;  Surgeon: Marin Shutter, MD;  Location: [place];  Service: Orthopedics;  Laterality: Left;  . TOTAL SHOULDER ARTHROPLASTY Right 04/10/2018   Procedure: TOTAL REVERSE SHOULDER ARTHROPLASTY;  Surgeon: Justice Britain, MD;  [facility];  Service: Orthopedics;  Laterality: Right;  132mn    There were no vitals filed for this visit.  Subjective Assessment - 02/06/19 1812    Subjective  Pt. entered PT in upbeat mood.  No new complaints.  Pt. still planning on using [employer] as trainer/ gym in upcoming weeks.    Patient is accompained by:  Family member    Pertinent History  extensive PMH including intensive care unit stays, rehab facility stays, previously ambulated with RW.. COPD, SOB, HTN. Denies CHF, reported she has been cleared by a cardiologist.    Limitations  Walking;Lifting;Standing;Writing;House hold activities    How long can you sit comfortably?  unlimited    How long can you stand comfortably?  3-4 hours    How long can you walk comfortably?  3-4 hours    Diagnostic tests  last x-rays of left knee where in November 2016 which looked good;     Patient Stated Goals  Increase B LE muscle strength/ safety with gait    Currently in Pain?  No/denies          Therapeutic exercises:  NustepL5 12 min. B UE/LE (warm-up).   Seated hip flexion, LAQ, heel toe raises/ marchingwith 5# ankle weights, 20x. Standing hip ex. (all planes)- 5# ankle wt. 20x each.  Walking with 5# ankle wts. And 1 UE assist for safety.   Partial lunge walking with 5# 10 feet x 4.  Discussed HEP/ ex. At [employer]  Sit to stands from chair/[employer] without UE assist.  Moderate cuing and instruction given.     12" step touches (CGA/min. A for safety/ verbal cuing)- UE assist required Walking in hallway with varying terrain/ blue mat (wts. Underneath) Walking in  //-bars with step overs (3") 5x.     PT Short Term Goals - 08/12/18 1236      PT SHORT TERM GOAL #1   Title  Pt. will increase AAROM with ER to 30 degrees in neutral  without increase in pain to regain independent self-care tasks.     Baseline  30 deg. ER on R.      Time  4    Period  Weeks    Status  Achieved    Target Date  06/05/18        PT Long Term Goals - 01/28/19 1634      PT LONG TERM GOAL #1   Title  Pt. will increase FOTO to 56 to improve functional mobility.    Baseline  Initial FOTO: 51; FOTO 57 10/06/2018; Pt FOTO 61 11/06/2018    Time  4    Period  Weeks    Status  Achieved    Target Date  11/06/18      PT LONG TERM GOAL #2   Title  Pt. will be independent with HEP to increase B LE muscle strength 1/2 muscle grade to improve standing tolerance/ gait.    Baseline  B LE AROM: limited L ankle AROM as compared to R ankle. B LE muscle weakness: hip flexor 4/5 MMT, quads/ hamstring 4+/5 MMT, hip abduction 4+/5 MMT, hip adduction 4/5 MMT, ankle 4/5 MMT except PF 3+/5 MMT.; L/R: Hip flexion 4/5, 4+/5; Knee extension 5/5 B Knee flexion 5/5 B' DF 5/5 B 10/06/2018; Pt LE strength grossly 5/5 except R hip flexoin 4+/5 11/06/2018; Pt B LE strength grossly 5/5 12/04/2018    Time  4    Period  Weeks    Status  Achieved    Target Date  12/04/18      PT LONG TERM GOAL #3   Title  Pt. will demonstrate improved upright posture for >15 minutes with standing ther.ex.    Baseline  Poor standing posture/ forward head/ rounded sh; Pt maintained improved upright posture for 10 minutes before requiring cuing 10/06/2018; pt able to self correct posture without cuing ~50% of time, demonstrating improved upright posture with standing activities still within still for ~10 min 11/04/2018; Pt requires frequent cuing today for upright posture with standing exercises (required cuing within <5 min of starting exercises) 12/04/2018; Pt requires frequent cueing for upright posture with standing exercises  01/06/2019    Time  4    Period  Weeks    Status  On-going    Target Date  02/24/19      PT LONG TERM  GOAL #4   Title  Pt. able to ambulate outside with consistent hip flexion/ heel strike and least restrictive assistive device safely.    Baseline  Pt. ambulates with RW with limited hip flexion/ increase fall risk; Pt with improved heelstrike, but inconsistend hip flexion 10/06/2018; Pt still requires ~60-70% cuing for consistent hip flexion, heel strike and upright posture with RW 11/06/2018; Unable to assess outside d/t weather, however, pt requires cuing for hip flexion/step length/posture during obstacle course with RW approx 50% of time, >70% with hemiwalker 12/04/2018. Pt able to ambulate outside with consistent heel strike with minimal cueing for increased step length with RW 01/06/2019    Time  4    Period  Weeks    Status  Achieved    Target Date  01/06/19      PT LONG TERM GOAL #5   Title  Pt will ambulate with consistent reciprocal step pattern 100% of the time with minor cuing instead of step-to gait pattern with walker to improve mobility gait mechanics.    Baseline  Pt requires cuing ~80% of the time to increase step length; pt requiring cuing ~50-60% time to increase step length 11/04/2018; pt requires cuing for increased step-length (to avoid step-to pattern) approx 50% of time with RW and >70% with hemiwalker. 12/04/2018. Pt ambulates with reciprocal step through pattern with RW 01/06/2019    Time  4    Period  Weeks    Status  Achieved    Target Date  01/06/19      PT LONG TERM GOAL #6   Title  Pt will complete 10MWT with LAD with gait speed of at least 0.8 m/s for community ambulation.    Baseline  10MWT with RW 0.35 m/s, with lofstrand 0.36 m/s 01/06/2019    Time  4    Period  Weeks    Status  Not Met    Target Date  02/24/19      PT LONG TERM GOAL #7   Title  Pt will perform 5xSTS in at least 15 seconds to demonstrate increased LE power.    Baseline  5xSTS with  unilateral UE support to rise 23 seconds 01/06/2019.  11/20: 28.3 seconds.    Time  4    Period  Weeks    Status  Not Met    Target Date  02/24/19            Plan - 02/06/19 1814    Clinical Impression Statement  Standing R hip flexion remains difficult/ limited due to hip flexor weakness.  No episodes of L knee buckling while focusing on R hip flexion/ step ups/ transfers into drivers side of vehicle.  Pt. works really hard during tx. session to progress LE strengthening/ muscle endurance with standing and functional tasks.  No c/o pain during tx. today.  Pt. continues to benefit from use of rollator with all aspects of walking outside of //-bars.    Personal Factors and Comorbidities  Age;Comorbidity 3+    Comorbidities  CPD, HTN, Obesity    Stability/Clinical Decision Making  Evolving/Moderate complexity    Clinical Decision Making  Moderate    Rehab Potential  Good    Clinical Impairments Affecting Rehab Potential  positive: motivation, family support, negative: co-morbidities, deconditioned, high fall risk; Body structures that will need to be addressed: weakness, numbness/tingling, impaired balance, difficulty walking, decreased transfer ability, etc; Due to co-morbidities her clinical presenstation is evolving;     PT Frequency  2x / week  PT Duration  4 weeks    PT Treatment/Interventions  Cryotherapy;Electrical Stimulation;Ultrasound;Moist Heat;Gait training;Functional mobility training;Therapeutic activities;Therapeutic exercise;Neuromuscular re-education;Scar mobilization;Balance training;Stair training;Patient/family education;Manual techniques    PT Next Visit Plan  R glut med stabilization; progress LE strength/ endurance,    PT Home Exercise Plan  See prescribed HEP.     Consulted and Agree with Plan of Care  Patient       Patient will benefit from skilled therapeutic intervention in order to improve the following deficits and impairments:  Decreased endurance,  Decreased range of motion, Decreased skin integrity, Decreased strength, Hypomobility, Impaired UE functional use, Increased fascial restricitons, Pain, Postural dysfunction, Impaired flexibility, Decreased scar mobility, Decreased mobility, Decreased balance, Improper body mechanics, Abnormal gait  Visit Diagnosis: Muscle weakness (generalized)  Gait difficulty  Abnormal posture     Problem List Patient Active Problem List   Diagnosis Date Noted  . S/P reverse total shoulder arthroplasty, 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  . Chronic diastolic heart failure ([place]) 08/09/2016  . COPD (chronic obstructive pulmonary disease) ([place]) 08/09/2016  . Pulmonary infiltrates   . Pneumonia due to Streptococcus pneumoniae ([place])   . Fever   . Community acquired pneumonia of right lung   . Palliative care by specialist   . Goals of care, counseling/discussion   . Pressure injury of skin 07/03/2016  . COPD exacerbation ([place]) 07/21/2015  . Acute renal insufficiency 07/21/2015  . Hyperglycemia 07/21/2015  . Benign essential HTN 07/21/2015  . S/P shoulder replacement 07/02/2013   Pura Spice, PT, DPT # [id number] 02/06/2019, 7:05 PM  [facility] [address] [place], [name], [address] Phone: [phone]   Fax:  [phone]  Name: Kaydra Borgen MRN: [id number] Date of Birth: [birth year]

## 2019-02-10 ENCOUNTER — Ambulatory Visit: Payer: Medicare HMO | Admitting: Physical Therapy

## 2019-02-10 ENCOUNTER — Encounter: Payer: Self-pay | Admitting: Physical Therapy

## 2019-02-10 ENCOUNTER — Other Ambulatory Visit: Payer: Self-pay

## 2019-02-10 DIAGNOSIS — R269 Unspecified abnormalities of gait and mobility: Secondary | ICD-10-CM

## 2019-02-10 DIAGNOSIS — M6281 Muscle weakness (generalized): Secondary | ICD-10-CM

## 2019-02-10 DIAGNOSIS — R293 Abnormal posture: Secondary | ICD-10-CM | POA: Diagnosis not present

## 2019-02-10 NOTE — Therapy (Signed)
[facility] [address] [place], [name], [address] Phone: [phone]   Fax:  [phone]  Physical Therapy Treatment  Patient Details  Name: Hannah Powell MRN: [id number] Date of Birth: 1945/06/17 Referring Provider (PT): Dr. Ramonita Lab III   Encounter Date: 02/10/2019  PT End of Session - 02/10/19 1345    Visit Number  35    Number of Visits  39    Date for PT Re-Evaluation  02/24/19    Authorization - Visit Number  5    Authorization - Number of Visits  10    PT Start Time  6256    PT Stop Time  1337    PT Time Calculation (min)  49 min    Equipment Utilized During Treatment  Gait belt    Activity Tolerance  Patient tolerated treatment well    Behavior During Therapy  [facility] for tasks assessed/performed       Past Medical History:  Diagnosis Date  . Anxiety   . Arthritis   . CHF (congestive heart failure) ([place])   . COPD (chronic obstructive pulmonary disease) ([place])   . Edema    feet/legs  . Hypertension    dr Otho Najjar     . Hypothyroidism   . Leg weakness, bilateral   . Osteoporosis   . Oxygen deficit    2l with bipap at night  . RLS (restless legs syndrome)   . Shortness of breath   . Sleep apnea    bipap  . Wheezing     Past Surgical History:  Procedure Laterality Date  . BACK SURGERY     cervical  . BREAST BIOPSY Left    needle bx-neg  . CATARACT EXTRACTION W/PHACO Left 05/07/2017   Procedure: CATARACT EXTRACTION PHACO AND INTRAOCULAR LENS PLACEMENT (IOC);  Surgeon: Birder Robson, MD;  Location: [facility];  Service: Ophthalmology;  Laterality: Left;  [place] 00:48.7 AP% 11.3 CDE 5.46 Fluid Pack Lot # [id number]  . CATARACT EXTRACTION W/PHACO Right 04/23/2017   Procedure: CATARACT EXTRACTION PHACO AND INTRAOCULAR LENS PLACEMENT ([place]);  Surgeon: Birder Robson, MD;  Location: [facility];  Service: Ophthalmology;  Laterality: Right;  [place] 00:29 AP% 12.8 CDE 3.79 FLUID PACK LOT # [id number] H  . FOOT  ARTHROPLASTY    . JOINT REPLACEMENT     x2 tkr  . TOTAL SHOULDER ARTHROPLASTY Left 07/02/2013   Procedure: LEFT TOTAL SHOULDER ARTHROPLASTY;  Surgeon: Marin Shutter, MD;  Location: [place];  Service: Orthopedics;  Laterality: Left;  . TOTAL SHOULDER ARTHROPLASTY Right 04/10/2018   Procedure: TOTAL REVERSE SHOULDER ARTHROPLASTY;  Surgeon: Justice Britain, MD;  [facility]: [facility];  Service: Orthopedics;  Laterality: Right;  129mn    There were no vitals filed for this visit.  Subjective Assessment - 02/10/19 1344    Subjective  Pt. did cardio machines at [employer]  Pt. Still planning [facility] [employer]  No new complaints.  Pt. able to get in[facility] [employer] since last tx. session.    Patient is accompained by:  Family member    Pertinent History  extensive PMH including intensive care unit stays, rehab facility stays, previously ambulated with RW.. COPD, SOB, HTN. Denies CHF, reported she has been cleared by a cardiologist.    Limitations  Walking;Lifting;Standing;Writing;House hold activities    How long can you sit comfortably?  unlimited    How long can you stand comfortably?  3-4 hours    How long  can you walk comfortably?  3-4 hours    Diagnostic tests  last x-rays of left knee where in November 2016 which looked good;     Patient Stated Goals  Increase B LE muscle strength/ safety with gait    Currently in Pain?  No/denies         Therapeutic exercises:  NustepL512 min. B UE/LE (warm-up).   5# ankle wts.: walking in PT clinic/ over agility ladder/ forward, backwards and lateral with light UE assist 4x (mirror feedback and verbal cuing to correct head position/posture).  Seated hip flexion, LAQ, heel toe raises/ marchingwith 5# ankle weights, 20x. Standing hip ex. (all planes)-5# ankle wt. 20x each.   Neuro.mm.:  Sit to stands from blue mat table (varying heights) without UE assist.  Moderate cuing and instruction given for technique/  posture.     Walking in //-bars with step overs (3") 10x.  Pt. Had 5# ankle wts. On during step overs.  Increase repetitions today with fatigue noted and increase forward posture/ head position.  Moderate cuing to correct with mirror feedback.         PT Short Term Goals - 08/12/18 1236      PT SHORT TERM GOAL #1   Title  Pt. will increase AAROM with ER to 30 degrees in neutral  without increase in pain to regain independent self-care tasks.     Baseline  30 deg. ER on R.      Time  4    Period  Weeks    Status  Achieved    Target Date  06/05/18        PT Long Term Goals - 01/28/19 1634      PT LONG TERM GOAL #1   Title  Pt. will increase FOTO to 56 to improve functional mobility.    Baseline  Initial FOTO: 51; FOTO 57 10/06/2018; Pt FOTO 61 11/06/2018    Time  4    Period  Weeks    Status  Achieved    Target Date  11/06/18      PT LONG TERM GOAL #2   Title  Pt. will be independent with HEP to increase B LE muscle strength 1/2 muscle grade to improve standing tolerance/ gait.    Baseline  B LE AROM: limited L ankle AROM as compared to R ankle. B LE muscle weakness: hip flexor 4/5 MMT, quads/ hamstring 4+/5 MMT, hip abduction 4+/5 MMT, hip adduction 4/5 MMT, ankle 4/5 MMT except PF 3+/5 MMT.; L/R: Hip flexion 4/5, 4+/5; Knee extension 5/5 B Knee flexion 5/5 B' DF 5/5 B 10/06/2018; Pt LE strength grossly 5/5 except R hip flexoin 4+/5 11/06/2018; Pt B LE strength grossly 5/5 12/04/2018    Time  4    Period  Weeks    Status  Achieved    Target Date  12/04/18      PT LONG TERM GOAL #3   Title  Pt. will demonstrate improved upright posture for >15 minutes with standing ther.ex.    Baseline  Poor standing posture/ forward head/ rounded sh; Pt maintained improved upright posture for 10 minutes before requiring cuing 10/06/2018; pt able to self correct posture without cuing ~50% of time, demonstrating improved upright posture with standing activities still within still for ~10 min 11/04/2018;  Pt requires frequent cuing today for upright posture with standing exercises (required cuing within <5 min of starting exercises) 12/04/2018; Pt requires frequent cueing for upright posture with standing exercises 01/06/2019  Time  4    Period  Weeks    Status  On-going    Target Date  02/24/19      PT LONG TERM GOAL #4   Title  Pt. able to ambulate outside with consistent hip flexion/ heel strike and least restrictive assistive device safely.    Baseline  Pt. ambulates with RW with limited hip flexion/ increase fall risk; Pt with improved heelstrike, but inconsistend hip flexion 10/06/2018; Pt still requires ~60-70% cuing for consistent hip flexion, heel strike and upright posture with RW 11/06/2018; Unable to assess outside d/t weather, however, pt requires cuing for hip flexion/step length/posture during obstacle course with RW approx 50% of time, >70% with hemiwalker 12/04/2018. Pt able to ambulate outside with consistent heel strike with minimal cueing for increased step length with RW 01/06/2019    Time  4    Period  Weeks    Status  Achieved    Target Date  01/06/19      PT LONG TERM GOAL #5   Title  Pt will ambulate with consistent reciprocal step pattern 100% of the time with minor cuing instead of step-to gait pattern with walker to improve mobility gait mechanics.    Baseline  Pt requires cuing ~80% of the time to increase step length; pt requiring cuing ~50-60% time to increase step length 11/04/2018; pt requires cuing for increased step-length (to avoid step-to pattern) approx 50% of time with RW and >70% with hemiwalker. 12/04/2018. Pt ambulates with reciprocal step through pattern with RW 01/06/2019    Time  4    Period  Weeks    Status  Achieved    Target Date  01/06/19      PT LONG TERM GOAL #6   Title  Pt will complete 10MWT with LAD with gait speed of at least 0.8 m/s for community ambulation.    Baseline  10MWT with RW 0.35 m/s, with lofstrand 0.36 m/s 01/06/2019    Time  4     Period  Weeks    Status  Not Met    Target Date  02/24/19      PT LONG TERM GOAL #7   Title  Pt will perform 5xSTS in at least 15 seconds to demonstrate increased LE power.    Baseline  5xSTS with unilateral UE support to rise 23 seconds 01/06/2019.  11/20: 28.3 seconds.    Time  4    Period  Weeks    Status  Not Met    Target Date  02/24/19            Plan - 02/10/19 1346    Clinical Impression Statement  PT focusing on increasing standing/walking tolerance with fewer rest breaks today.  Pt. able to complete increase repetitions in //-bars with use of 5# ankle wts.  Pt. fatigued during tx. but worked hard.  Moderate cuing to correct head position/ upright posture t/o tx. session.  No change to HEP.    Personal Factors and Comorbidities  Age;Comorbidity 3+    Comorbidities  CPD, HTN, Obesity    Stability/Clinical Decision Making  Evolving/Moderate complexity    Clinical Decision Making  Moderate    Rehab Potential  Good    Clinical Impairments Affecting Rehab Potential  positive: motivation, family support, negative: co-morbidities, deconditioned, high fall risk; Body structures that will need to be addressed: weakness, numbness/tingling, impaired balance, difficulty walking, decreased transfer ability, etc; Due to co-morbidities her clinical presenstation is evolving;     PT  Frequency  2x / week    PT Duration  4 weeks    PT Treatment/Interventions  Cryotherapy;Electrical Stimulation;Ultrasound;Moist Heat;Gait training;Functional mobility training;Therapeutic activities;Therapeutic exercise;Neuromuscular re-education;Scar mobilization;Balance training;Stair training;Patient/family education;Manual techniques    PT Next Visit Plan  R glut med stabilization; progress LE strength/ endurance,    PT Home Exercise Plan  See prescribed HEP.     Consulted and Agree with Plan of Care  Patient       Patient will benefit from skilled therapeutic intervention in order to improve the  following deficits and impairments:  Decreased endurance, Decreased range of motion, Decreased skin integrity, Decreased strength, Hypomobility, Impaired UE functional use, Increased fascial restricitons, Pain, Postural dysfunction, Impaired flexibility, Decreased scar mobility, Decreased mobility, Decreased balance, Improper body mechanics, Abnormal gait  Visit Diagnosis: Muscle weakness (generalized)  Gait difficulty  Abnormal posture     Problem List Patient Active Problem List   Diagnosis Date Noted  . S/P reverse total shoulder arthroplasty, 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  . Chronic diastolic heart failure ([place]) 08/09/2016  . COPD (chronic obstructive pulmonary disease) ([place]) 08/09/2016  . Pulmonary infiltrates   . Pneumonia due to Streptococcus pneumoniae ([place])   . Fever   . Community acquired pneumonia of right lung   . Palliative care by specialist   . Goals of care, counseling/discussion   . Pressure injury of skin 07/03/2016  . COPD exacerbation ([place]) 07/21/2015  . Acute renal insufficiency 07/21/2015  . Hyperglycemia 07/21/2015  . Benign essential HTN 07/21/2015  . S/P shoulder replacement 07/02/2013   Pura Spice, PT, DPT # [id number] 02/10/2019, 1:48 PM  [facility] [address] [place], [name], [address] Phone: [phone]   Fax:  [phone]  Name: Hannah Powell MRN: [id number] Date of Birth: [birth year]

## 2019-02-11 ENCOUNTER — Other Ambulatory Visit: Payer: Self-pay | Admitting: Internal Medicine

## 2019-02-17 ENCOUNTER — Ambulatory Visit: Payer: Medicare HMO | Admitting: Physical Therapy

## 2019-02-18 DIAGNOSIS — R197 Diarrhea, unspecified: Secondary | ICD-10-CM | POA: Diagnosis not present

## 2019-02-19 ENCOUNTER — Encounter: Payer: Medicare HMO | Admitting: Physical Therapy

## 2019-02-19 DIAGNOSIS — Z20828 Contact with and (suspected) exposure to other viral communicable diseases: Secondary | ICD-10-CM | POA: Diagnosis not present

## 2019-02-20 ENCOUNTER — Other Ambulatory Visit: Payer: Self-pay

## 2019-02-20 ENCOUNTER — Ambulatory Visit: Payer: Medicare HMO | Admitting: Physical Therapy

## 2019-02-24 ENCOUNTER — Encounter: Payer: Medicare HMO | Admitting: Physical Therapy

## 2019-02-26 ENCOUNTER — Encounter: Payer: Self-pay | Admitting: Physical Therapy

## 2019-02-26 ENCOUNTER — Ambulatory Visit: Payer: Medicare HMO | Attending: Internal Medicine | Admitting: Physical Therapy

## 2019-02-26 ENCOUNTER — Other Ambulatory Visit: Payer: Self-pay

## 2019-02-26 DIAGNOSIS — R293 Abnormal posture: Secondary | ICD-10-CM

## 2019-02-26 DIAGNOSIS — R269 Unspecified abnormalities of gait and mobility: Secondary | ICD-10-CM | POA: Diagnosis not present

## 2019-02-26 DIAGNOSIS — M6281 Muscle weakness (generalized): Secondary | ICD-10-CM

## 2019-02-26 NOTE — Therapy (Addendum)
[facility] [address]. [place], [name], [address] Phone: [phone]   Fax:  [phone]  Physical Therapy Treatment  Patient Details  Name: Hannah Powell MRN: [id number] Date of Birth: [birth year] Referring Provider (PT): Dr. Ramonita Lab III   Encounter Date: 02/26/2019     Encounter Date: 02/26/2019  PT End of Session - 02/26/19 1258    Visit Number  [id number]    Date for PT Re-Evaluation  02/24/19    Authorization - Visit Number  1    Authorization - Number of Visits  10    PT Start Time  1250    PT Stop Time  1342    PT Time Calculation (min)  52 min    Equipment Utilized During Treatment  Gait belt    Activity Tolerance  Patient tolerated treatment well    Behavior During Therapy  [facility] for tasks assessed/performed         Past Medical History:  Diagnosis Date  . Anxiety   . Arthritis   . CHF (congestive heart failure) ([place])   . COPD (chronic obstructive pulmonary disease) ([place])   . Edema    feet/legs  . Hypertension    dr Otho Najjar     . Hypothyroidism   . Leg weakness, bilateral   . Osteoporosis   . Oxygen deficit    2l with bipap at night  . RLS (restless legs syndrome)   . Shortness of breath   . Sleep apnea    bipap  . Wheezing     Past Surgical History:  Procedure Laterality Date  . BACK SURGERY     cervical  . BREAST BIOPSY Left    needle bx-neg  . CATARACT EXTRACTION W/PHACO Left 05/07/2017   Procedure: CATARACT EXTRACTION PHACO AND INTRAOCULAR LENS PLACEMENT (IOC);  Surgeon: Birder Robson, MD;  Location: [facility];  Service: Ophthalmology;  Laterality: Left;  [place] 00:48.7 AP% 11.3 CDE 5.46 Fluid Pack Lot # [id number]  . CATARACT EXTRACTION W/PHACO Right 04/23/2017   Procedure: CATARACT EXTRACTION PHACO AND INTRAOCULAR LENS PLACEMENT ([place]);  Surgeon: Birder Robson, MD;  Location: [facility];  Service: Ophthalmology;  Laterality: Right;  [place] 00:29 AP% 12.8 CDE 3.79 FLUID PACK LOT # [id number]   . FOOT ARTHROPLASTY    . JOINT REPLACEMENT     x2 tkr  . TOTAL SHOULDER ARTHROPLASTY Left 07/02/2013   Procedure: LEFT TOTAL SHOULDER ARTHROPLASTY;  Surgeon: Marin Shutter, MD;  Location: [place];  Service: Orthopedics;  Laterality: Left;  . TOTAL SHOULDER ARTHROPLASTY Right 04/10/2018   Procedure: TOTAL REVERSE SHOULDER ARTHROPLASTY;  Surgeon: Justice Britain, MD;  Location: [facility];  Service: Orthopedics;  Laterality: Right;  158mn    There were no vitals filed for this visit.      [facility] 0001      Assessment   Medical Diagnosis  Bilateral leg weakness/ Gait difficulty    Referring Provider (PT)  Dr. BRamonita LabIII    Onset Date/Surgical Date  03/19/18      Prior Function   Level of Independence  Requires assistive device for independence      Cognition   Overall Cognitive Status  Within Functional Limits for tasks assessed       Pt. has been feeling sick with GI issues over past 2 weeks. Pt. reports 8/10 R low back pain this morning while getting out of bed (using heat/ ibuprofen) .       Therapeutic  exercises:  [id number]. B UE/LE (warm-up). 5# ankle wts.:  Seated hip flexion, LAQ, heel toe raises/ marchingwith 5# ankle weights, 20x. Standing hip ex. (all planes)- no ankle wts. With standing  Neuro.mm.:  Ambulate in clinic with use of RW/ amb in //-bars with light UE assist with posture correction (mirror feedback).   Turning CW/CCW in //-bars with UE assist required.   Step ups/ touches.  Unable to complete 6" step touches on L (fatigue)    PT Short Term Goals - 08/12/18 1236      PT SHORT TERM GOAL #1   Title  Pt. will increase AAROM with ER to 30 degrees in neutral  without increase in pain to regain independent self-care tasks.     Baseline  30 deg. ER on R.      Time  4    Period  Weeks    Status  Achieved    Target Date  06/05/18        PT Long Term Goals - 03/08/19 1454      PT LONG TERM GOAL #1   Title  Pt.  will increase FOTO to 56 to improve functional mobility.    Baseline  Initial FOTO: 51; FOTO 57 10/06/2018; Pt FOTO 61 11/06/2018    Time  4    Period  Weeks    Status  Achieved      PT LONG TERM GOAL #2   Title  Pt. will be independent with HEP to increase B LE muscle strength 1/2 muscle grade to improve standing tolerance/ gait.    Baseline  B LE AROM: limited L ankle AROM as compared to R ankle. B LE muscle weakness: hip flexor 4/5 MMT, quads/ hamstring 4+/5 MMT, hip abduction 4+/5 MMT, hip adduction 4/5 MMT, ankle 4/5 MMT except PF 3+/5 MMT.; L/R: Hip flexion 4/5, 4+/5; Knee extension 5/5 B Knee flexion 5/5 B' DF 5/5 B 10/06/2018; Pt LE strength grossly 5/5 except R hip flexoin 4+/5 11/06/2018; Pt B LE strength grossly 5/5 12/04/2018    Time  4    Period  Weeks    Status  Achieved      PT LONG TERM GOAL #3   Title  Pt. will demonstrate improved upright posture for >15 minutes with standing ther.ex.    Baseline  Poor standing posture/ forward head/ rounded sh; Pt maintained improved upright posture for 10 minutes before requiring cuing 10/06/2018; pt able to self correct posture without cuing ~50% of time, demonstrating improved upright posture with standing activities still within still for ~10 min 11/04/2018; Pt requires frequent cuing today for upright posture with standing exercises (required cuing within <5 min of starting exercises) 12/04/2018; Pt requires frequent cueing for upright posture with standing exercises 01/06/2019.  12/10: pt. fatigues with upright posture/ prolonged standing.  Pt. able to correct for short distances.    Time  4    Period  Weeks    Status  Not Met    Target Date  03/26/19      PT LONG TERM GOAL #4   Title  Pt. able to ambulate outside with consistent hip flexion/ heel strike and least restrictive assistive device safely.    Baseline  Pt. ambulates with RW with limited hip flexion/ increase fall risk; Pt with improved heelstrike, but inconsistend hip flexion  10/06/2018; Pt still requires ~60-70% cuing for consistent hip flexion, heel strike and upright posture with RW 11/06/2018; Unable to assess outside d/t weather, however, pt requires cuing for hip  flexion/step length/posture during obstacle course with RW approx 50% of time, >70% with hemiwalker 12/04/2018. Pt able to ambulate outside with consistent heel strike with minimal cueing for increased step length with RW 01/06/2019    Time  4    Period  Weeks    Status  Achieved      PT LONG TERM GOAL #5   Title  Pt will ambulate with consistent reciprocal step pattern 100% of the time with minor cuing instead of step-to gait pattern with walker to improve mobility gait mechanics.    Baseline  Pt requires cuing ~80% of the time to increase step length; pt requiring cuing ~50-60% time to increase step length 11/04/2018; pt requires cuing for increased step-length (to avoid step-to pattern) approx 50% of time with RW and >70% with hemiwalker. 12/04/2018. Pt ambulates with reciprocal step through pattern with RW 01/06/2019    Time  4    Period  Weeks    Status  Achieved      PT LONG TERM GOAL #6   Title  Pt will complete 10MWT with LAD with gait speed of at least 0.8 m/s for community ambulation.    Baseline  10MWT with RW 0.35 m/s, with lofstrand 0.36 m/s 01/06/2019    Time  4    Period  Weeks    Status  On-going    Target Date  03/26/19      PT LONG TERM GOAL #7   Title  Pt will perform 5xSTS in at least 15 seconds to demonstrate increased LE power.    Baseline  5xSTS with unilateral UE support to rise 23 seconds 01/06/2019.  11/20: 28.3 seconds.    Time  4    Period  Weeks    Status  On-going    Target Date  03/26/19         Pt. more fatigued today than normal with standing ther.ex./ walking with RW. Pt. limited with increase R low back/SI discomfort with R LE wt. bearing with resisted ex. No episodes of L knee buckling during step ups/ touches. Pt. does requires UE assist with all step touches  for safety. Pt. unable to complete any step touches >6 inches due to hip weakness/ fatigue. Pt. instructed with upright posture to increase hip flexion/ step ups into car. Pt. continues to prefer flexed posture with all standing/ walking tasks and moderate cuing to correct t/o tx. session. No c/o pain during tx. Pt. will continue to benefit from skilled PT services to increase LE strength/ upright posture/ mod. independence with gait.      Patient will benefit from skilled therapeutic intervention in order to improve the following deficits and impairments:  Decreased endurance, Decreased range of motion, Decreased skin integrity, Decreased strength, Hypomobility, Impaired UE functional use, Increased fascial restricitons, Pain, Postural dysfunction, Impaired flexibility, Decreased scar mobility, Decreased mobility, Decreased balance, Improper body mechanics, Abnormal gait  Visit Diagnosis: Muscle weakness (generalized)  Gait difficulty  Abnormal posture     Problem List Patient Active Problem List   Diagnosis Date Noted  . S/P reverse total shoulder arthroplasty, 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  . Chronic diastolic heart failure ([place]) 08/09/2016  . COPD (chronic obstructive pulmonary disease) ([place]) 08/09/2016  . Pulmonary infiltrates   . Pneumonia due to Streptococcus pneumoniae ([place])   . Fever   . Community acquired pneumonia of right lung   . Palliative care by specialist   . Goals of care, counseling/discussion   . Pressure injury of skin 07/03/2016  .  COPD exacerbation ([place]) 07/21/2015  . Acute renal insufficiency 07/21/2015  . Hyperglycemia 07/21/2015  . Benign essential HTN 07/21/2015  . S/P shoulder replacement 07/02/2013   Pura Spice, PT, DPT # [id number] 03/08/2019, 2:56 PM  [facility] [address] [place], [name], [address] Phone: [phone]   Fax:  [phone]  Name: Hannah Powell MRN: [id number] Date of Birth:  [birth year]

## 2019-03-03 ENCOUNTER — Encounter: Payer: Self-pay | Admitting: Physical Therapy

## 2019-03-03 ENCOUNTER — Other Ambulatory Visit: Payer: Self-pay

## 2019-03-03 ENCOUNTER — Ambulatory Visit: Payer: Medicare HMO | Admitting: Physical Therapy

## 2019-03-03 DIAGNOSIS — M6281 Muscle weakness (generalized): Secondary | ICD-10-CM

## 2019-03-03 DIAGNOSIS — R269 Unspecified abnormalities of gait and mobility: Secondary | ICD-10-CM | POA: Diagnosis not present

## 2019-03-03 DIAGNOSIS — R293 Abnormal posture: Secondary | ICD-10-CM | POA: Diagnosis not present

## 2019-03-03 NOTE — Therapy (Addendum)
[facility] [address]. [place], [name], [address] Phone: [phone]   Fax:  [phone]  Physical Therapy Treatment  Patient Details  Name: Hannah Powell MRN: [id number] Date of Birth: 03/07/1946 Referring Provider (PT): Dr. Ramonita Lab III   Encounter Date: 03/03/2019   Treatment: 72 of 30.  Recert date: 09/19/5327 9242 to 1322   Past Medical History:  Diagnosis Date  . Anxiety   . Arthritis   . CHF (congestive heart failure) ([place])   . COPD (chronic obstructive pulmonary disease) ([place])   . Edema    feet/legs  . Hypertension    dr Otho Najjar     . Hypothyroidism   . Leg weakness, bilateral   . Osteoporosis   . Oxygen deficit    2l with bipap at night  . RLS (restless legs syndrome)   . Shortness of breath   . Sleep apnea    bipap  . Wheezing     Past Surgical History:  Procedure Laterality Date  . BACK SURGERY     cervical  . BREAST BIOPSY Left    needle bx-neg  . CATARACT EXTRACTION W/PHACO Left 05/07/2017   Procedure: CATARACT EXTRACTION PHACO AND INTRAOCULAR LENS PLACEMENT (IOC);  Surgeon: Birder Robson, MD;  Location: [facility];  Service: Ophthalmology;  Laterality: Left;  [place] 00:48.7 AP% 11.3 CDE 5.46 Fluid Pack Lot # [id number]  . CATARACT EXTRACTION W/PHACO Right 04/23/2017   Procedure: CATARACT EXTRACTION PHACO AND INTRAOCULAR LENS PLACEMENT ([place]);  Surgeon: Birder Robson, MD;  Location: [facility];  Service: Ophthalmology;  Laterality: Right;  [place] 00:29 AP% 12.8 CDE 3.79 FLUID PACK LOT # [id number]  . FOOT ARTHROPLASTY    . JOINT REPLACEMENT     x2 tkr  . TOTAL SHOULDER ARTHROPLASTY Left 07/02/2013   Procedure: LEFT TOTAL SHOULDER ARTHROPLASTY;  Surgeon: Marin Shutter, MD;  Location: [place];  Service: Orthopedics;  Laterality: Left;  . TOTAL SHOULDER ARTHROPLASTY Right 04/10/2018   Procedure: TOTAL REVERSE SHOULDER ARTHROPLASTY;  Surgeon: Justice Britain, MD;  [facility];  Service: Orthopedics;   Laterality: Right;  160mn    There were no vitals filed for this visit.    Pt. states she is not having a great day. Pt. still trying to get over GI issues. Pt. was taking Pepto but it has led to black stools so pt. contacted Dr. KCaryl Comesand has stopped taking.     Therapeutic exercises:  [id number]. B UE/LE (warm-up).  5# ankle wts.:  Seated hip flexion, LAQ, heel toe raises/ marchingwith 5# ankle weights, 20x. Standing hip ex. 5# (all planes)- 20x. with standing.  Sit to stands from chair at //-bars with mirror feedback (cuing for eccentric quad control).    Discussed HEP   Neuro.mm.:  Standing posture correction/ walking with RW with mirror feedback for posture correction  Ambulate in clinic with use of RW/ amb in //-bars with light UE assist with posture correction (mirror feedback).    Airex: static standing/ wt. Shifting/ 6" step touches (recip.)- B UE assist required.    Standing dynamic partial lunges/ turning with no UE assist (within //-bars for safety).      PT Short Term Goals - 08/12/18 1236      PT SHORT TERM GOAL #1   Title  Pt. will increase AAROM with ER to 30 degrees in neutral  without increase in pain to regain independent self-care tasks.     Baseline  30 deg. ER on R.  Time  4    Period  Weeks    Status  Achieved    Target Date  06/05/18        PT Long Term Goals - 03/08/19 1454      PT LONG TERM GOAL #1   Title  Pt. will increase FOTO to 56 to improve functional mobility.    Baseline  Initial FOTO: 51; FOTO 57 10/06/2018; Pt FOTO 61 11/06/2018    Time  4    Period  Weeks    Status  Achieved      PT LONG TERM GOAL #2   Title  Pt. will be independent with HEP to increase B LE muscle strength 1/2 muscle grade to improve standing tolerance/ gait.    Baseline  B LE AROM: limited L ankle AROM as compared to R ankle. B LE muscle weakness: hip flexor 4/5 MMT, quads/ hamstring 4+/5 MMT, hip abduction 4+/5 MMT, hip adduction 4/5  MMT, ankle 4/5 MMT except PF 3+/5 MMT.; L/R: Hip flexion 4/5, 4+/5; Knee extension 5/5 B Knee flexion 5/5 B' DF 5/5 B 10/06/2018; Pt LE strength grossly 5/5 except R hip flexoin 4+/5 11/06/2018; Pt B LE strength grossly 5/5 12/04/2018    Time  4    Period  Weeks    Status  Achieved      PT LONG TERM GOAL #3   Title  Pt. will demonstrate improved upright posture for >15 minutes with standing ther.ex.    Baseline  Poor standing posture/ forward head/ rounded sh; Pt maintained improved upright posture for 10 minutes before requiring cuing 10/06/2018; pt able to self correct posture without cuing ~50% of time, demonstrating improved upright posture with standing activities still within still for ~10 min 11/04/2018; Pt requires frequent cuing today for upright posture with standing exercises (required cuing within <5 min of starting exercises) 12/04/2018; Pt requires frequent cueing for upright posture with standing exercises 01/06/2019.  12/10: pt. fatigues with upright posture/ prolonged standing.  Pt. able to correct for short distances.    Time  4    Period  Weeks    Status  Not Met    Target Date  03/26/19      PT LONG TERM GOAL #4   Title  Pt. able to ambulate outside with consistent hip flexion/ heel strike and least restrictive assistive device safely.    Baseline  Pt. ambulates with RW with limited hip flexion/ increase fall risk; Pt with improved heelstrike, but inconsistend hip flexion 10/06/2018; Pt still requires ~60-70% cuing for consistent hip flexion, heel strike and upright posture with RW 11/06/2018; Unable to assess outside d/t weather, however, pt requires cuing for hip flexion/step length/posture during obstacle course with RW approx 50% of time, >70% with hemiwalker 12/04/2018. Pt able to ambulate outside with consistent heel strike with minimal cueing for increased step length with RW 01/06/2019    Time  4    Period  Weeks    Status  Achieved      PT LONG TERM GOAL #5   Title  Pt will  ambulate with consistent reciprocal step pattern 100% of the time with minor cuing instead of step-to gait pattern with walker to improve mobility gait mechanics.    Baseline  Pt requires cuing ~80% of the time to increase step length; pt requiring cuing ~50-60% time to increase step length 11/04/2018; pt requires cuing for increased step-length (to avoid step-to pattern) approx 50% of time with RW and >70% with hemiwalker. 12/04/2018.  Pt ambulates with reciprocal step through pattern with RW 01/06/2019    Time  4    Period  Weeks    Status  Achieved      PT LONG TERM GOAL #6   Title  Pt will complete 10MWT with LAD with gait speed of at least 0.8 m/s for community ambulation.    Baseline  10MWT with RW 0.35 m/s, with lofstrand 0.36 m/s 01/06/2019    Time  4    Period  Weeks    Status  On-going    Target Date  03/26/19      PT LONG TERM GOAL #7   Title  Pt will perform 5xSTS in at least 15 seconds to demonstrate increased LE power.    Baseline  5xSTS with unilateral UE support to rise 23 seconds 01/06/2019.  11/20: 28.3 seconds.    Time  4    Period  Weeks    Status  On-going    Target Date  03/26/19        Plan - 03/08/19 1818    Clinical Impression Statement  Pt. is continuing to get over a GI bug.  Pt. had a negative Covid test.  Moderate forward head/ posture while walking into PT clinic with heavy use of RW.  Pt. instructed in the importance of upright posture to increase hip flexion/ step pattern/ length with walking.  No LOB during tx. session but fatigue noted with prolonged standing ther.ex.  Pt. is planning to travel to [facility] for [facility] and instructed to remain compliant with a daily walking/ HEP.    Personal Factors and Comorbidities  Age;Comorbidity 3+    Comorbidities  CPD, HTN, Obesity    Stability/Clinical Decision Making  Evolving/Moderate complexity    Clinical Decision Making  Moderate    Rehab Potential  Good    Clinical Impairments Affecting Rehab Potential   positive: motivation, family support, negative: co-morbidities, deconditioned, high fall risk; Body structures that will need to be addressed: weakness, numbness/tingling, impaired balance, difficulty walking, decreased transfer ability, etc; Due to co-morbidities her clinical presenstation is evolving;     PT Frequency  2x / week    PT Duration  4 weeks    PT Treatment/Interventions  Cryotherapy;Electrical Stimulation;Ultrasound;Moist Heat;Gait training;Functional mobility training;Therapeutic activities;Therapeutic exercise;Neuromuscular re-education;Scar mobilization;Balance training;Stair training;Patient/family education;Manual techniques    PT Next Visit Plan  Progress LE strengthening/ upright posture.  Discuss trip to [place]  See prescribed HEP.     Consulted and Agree with Plan of Care  Patient       Patient will benefit from skilled therapeutic intervention in order to improve the following deficits and impairments:  Decreased endurance, Decreased range of motion, Decreased skin integrity, Decreased strength, Hypomobility, Impaired UE functional use, Increased fascial restricitons, Pain, Postural dysfunction, Impaired flexibility, Decreased scar mobility, Decreased mobility, Decreased balance, Improper body mechanics, Abnormal gait  Visit Diagnosis: Muscle weakness (generalized)  Gait difficulty  Abnormal posture     Problem List Patient Active Problem List   Diagnosis Date Noted  . S/P reverse total shoulder arthroplasty, 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  . Chronic diastolic heart failure ([place]) 08/09/2016  . COPD (chronic obstructive pulmonary disease) ([place]) 08/09/2016  . Pulmonary infiltrates   . Pneumonia due to Streptococcus pneumoniae ([place])   . Fever   . Community acquired pneumonia of right lung   . Palliative care by specialist   . Goals of care, counseling/discussion   . Pressure injury of skin 07/03/2016  .  COPD exacerbation ([place]) 07/21/2015  . Acute renal  insufficiency 07/21/2015  . Hyperglycemia 07/21/2015  . Benign essential HTN 07/21/2015  . S/P shoulder replacement 07/02/2013   Pura Spice, PT, DPT # [id number] 03/08/2019, 6:22 PM  [facility] [address] [place], [name], [address] Phone: [phone]   Fax:  [phone]  Name: Hannah Powell MRN: [id number] Date of Birth: 07-29-1945

## 2019-03-05 ENCOUNTER — Encounter: Payer: Medicare HMO | Admitting: Physical Therapy

## 2019-03-08 NOTE — Addendum Note (Signed)
Addended by: Pura Spice on: 03/08/2019 03:04 PM   Modules accepted: Orders

## 2019-03-10 ENCOUNTER — Encounter: Payer: Medicare HMO | Admitting: Physical Therapy

## 2019-03-17 ENCOUNTER — Ambulatory Visit: Payer: Medicare HMO | Admitting: Physical Therapy

## 2019-03-17 ENCOUNTER — Encounter: Payer: Self-pay | Admitting: Physical Therapy

## 2019-03-17 ENCOUNTER — Other Ambulatory Visit: Payer: Self-pay

## 2019-03-17 DIAGNOSIS — R293 Abnormal posture: Secondary | ICD-10-CM

## 2019-03-17 DIAGNOSIS — R269 Unspecified abnormalities of gait and mobility: Secondary | ICD-10-CM | POA: Diagnosis not present

## 2019-03-17 DIAGNOSIS — M6281 Muscle weakness (generalized): Secondary | ICD-10-CM | POA: Diagnosis not present

## 2019-03-17 NOTE — Therapy (Signed)
[facility] [address] [place], [name], [address] Phone: [phone]   Fax:  [phone]  Physical Therapy Treatment  Patient Details  Name: Hannah Powell MRN: [id number] Date of Birth: [birth year] Referring Provider (PT): Dr. Ramonita Lab III   Encounter Date: 03/17/2019  PT End of Session - 03/17/19 1125    Visit Number  38    Number of Visits  43    Date for PT Re-Evaluation  03/26/19    Authorization - Visit Number  3    Authorization - Number of Visits  10    PT Start Time  1117    PT Stop Time  1205    PT Time Calculation (min)  48 min    Equipment Utilized During Treatment  Gait belt    Activity Tolerance  Patient tolerated treatment well;Patient limited by fatigue    Behavior During Therapy  [facility] for tasks assessed/performed       Past Medical History:  Diagnosis Date  . Anxiety   . Arthritis   . CHF (congestive heart failure) ([place])   . COPD (chronic obstructive pulmonary disease) ([place])   . Edema    feet/legs  . Hypertension    dr Otho Najjar     . Hypothyroidism   . Leg weakness, bilateral   . Osteoporosis   . Oxygen deficit    2l with bipap at night  . RLS (restless legs syndrome)   . Shortness of breath   . Sleep apnea    bipap  . Wheezing     Past Surgical History:  Procedure Laterality Date  . BACK SURGERY     cervical  . BREAST BIOPSY Left    needle bx-neg  . CATARACT EXTRACTION W/PHACO Left 05/07/2017   Procedure: CATARACT EXTRACTION PHACO AND INTRAOCULAR LENS PLACEMENT (IOC);  Surgeon: Birder Robson, MD;  Location: [facility];  Service: Ophthalmology;  Laterality: Left;  [place] 00:48.7 AP% 11.3 CDE 5.46 Fluid Pack Lot # [id number]  . CATARACT EXTRACTION W/PHACO Right 04/23/2017   Procedure: CATARACT EXTRACTION PHACO AND INTRAOCULAR LENS PLACEMENT ([place]);  Surgeon: Birder Robson, MD;  Location: [facility];  Service: Ophthalmology;  Laterality: Right;  [place] 00:29 AP% 12.8 CDE 3.79 FLUID PACK LOT  # [id number]  . FOOT ARTHROPLASTY    . JOINT REPLACEMENT     x2 tkr  . TOTAL SHOULDER ARTHROPLASTY Left 07/02/2013   Procedure: LEFT TOTAL SHOULDER ARTHROPLASTY;  Surgeon: Marin Shutter, MD;  Location: [facility];  Service: Orthopedics;  Laterality: Left;  . TOTAL SHOULDER ARTHROPLASTY Right 04/10/2018   Procedure: TOTAL REVERSE SHOULDER ARTHROPLASTY;  Surgeon: Justice Britain, MD;  [facility];  Service: Orthopedics;  Laterality: Right;  170mn    There were no vitals filed for this visit.  Subjective Assessment - 03/17/19 1121    Subjective  Pt. states she had a great trip to [place]  Pt. reports no new issues.    Patient is accompained by:  Family member    Pertinent History  extensive PMH including intensive care unit stays, rehab facility stays, previously ambulated with RW.. COPD, SOB, HTN. Denies CHF, reported she has been cleared by a cardiologist.    Limitations  Walking;Lifting;Standing;Writing;House hold activities    How long can you sit comfortably?  unlimited    How long can you stand comfortably?  3-4 hours    How long can you walk comfortably?  3-4 hours    Diagnostic tests  last x-rays of left knee  where in November 2016 which looked good;     Patient Stated Goals  Increase B LE muscle strength/ safety with gait    Currently in Pain?  No/denies         Therapeutic ex.:  [id number]. B UE/LE (warm-up).  5# ankle wts.: Seated hip flexion, LAQ, heel toe raises/ marchingwith 5# ankle weights, 20x. Standing hip ex. 5# (all planes)- 20x. with standing.  Sit to stands from chair at //-bars with mirror feedback (cuing for eccentric quad control).    Discussed gym based/ aquatic ex.    Neuro.mm.:  Walking on blue mat with wts. Under for uneven terrain in //-bars.  Forward/ lateral walking 2x each.    Ambulate in clinic with use of RW/ amb in //-bars with light UE assist with posture correction (mirror feedback).     Standing dynamic partial lunges/ turning  with no UE assist (within //-bars for safety).      PT Short Term Goals - 08/12/18 1236      PT SHORT TERM GOAL #1   Title  Pt. will increase AAROM with ER to 30 degrees in neutral  without increase in pain to regain independent self-care tasks.     Baseline  30 deg. ER on R.      Time  4    Period  Weeks    Status  Achieved    Target Date  06/05/18        PT Long Term Goals - 03/08/19 1454      PT LONG TERM GOAL #1   Title  Pt. will increase FOTO to 56 to improve functional mobility.    Baseline  Initial FOTO: 51; FOTO 57 10/06/2018; Pt FOTO 61 11/06/2018    Time  4    Period  Weeks    Status  Achieved      PT LONG TERM GOAL #2   Title  Pt. will be independent with HEP to increase B LE muscle strength 1/2 muscle grade to improve standing tolerance/ gait.    Baseline  B LE AROM: limited L ankle AROM as compared to R ankle. B LE muscle weakness: hip flexor 4/5 MMT, quads/ hamstring 4+/5 MMT, hip abduction 4+/5 MMT, hip adduction 4/5 MMT, ankle 4/5 MMT except PF 3+/5 MMT.; L/R: Hip flexion 4/5, 4+/5; Knee extension 5/5 B Knee flexion 5/5 B' DF 5/5 B 10/06/2018; Pt LE strength grossly 5/5 except R hip flexoin 4+/5 11/06/2018; Pt B LE strength grossly 5/5 12/04/2018    Time  4    Period  Weeks    Status  Achieved      PT LONG TERM GOAL #3   Title  Pt. will demonstrate improved upright posture for >15 minutes with standing ther.ex.    Baseline  Poor standing posture/ forward head/ rounded sh; Pt maintained improved upright posture for 10 minutes before requiring cuing 10/06/2018; pt able to self correct posture without cuing ~50% of time, demonstrating improved upright posture with standing activities still within still for ~10 min 11/04/2018; Pt requires frequent cuing today for upright posture with standing exercises (required cuing within <5 min of starting exercises) 12/04/2018; Pt requires frequent cueing for upright posture with standing exercises 01/06/2019.  12/10: pt. fatigues with  upright posture/ prolonged standing.  Pt. able to correct for short distances.    Time  4    Period  Weeks    Status  Not Met    Target Date  03/26/19  PT LONG TERM GOAL #4   Title  Pt. able to ambulate outside with consistent hip flexion/ heel strike and least restrictive assistive device safely.    Baseline  Pt. ambulates with RW with limited hip flexion/ increase fall risk; Pt with improved heelstrike, but inconsistend hip flexion 10/06/2018; Pt still requires ~60-70% cuing for consistent hip flexion, heel strike and upright posture with RW 11/06/2018; Unable to assess outside d/t weather, however, pt requires cuing for hip flexion/step length/posture during obstacle course with RW approx 50% of time, >70% with hemiwalker 12/04/2018. Pt able to ambulate outside with consistent heel strike with minimal cueing for increased step length with RW 01/06/2019    Time  4    Period  Weeks    Status  Achieved      PT LONG TERM GOAL #5   Title  Pt will ambulate with consistent reciprocal step pattern 100% of the time with minor cuing instead of step-to gait pattern with walker to improve mobility gait mechanics.    Baseline  Pt requires cuing ~80% of the time to increase step length; pt requiring cuing ~50-60% time to increase step length 11/04/2018; pt requires cuing for increased step-length (to avoid step-to pattern) approx 50% of time with RW and >70% with hemiwalker. 12/04/2018. Pt ambulates with reciprocal step through pattern with RW 01/06/2019    Time  4    Period  Weeks    Status  Achieved      PT LONG TERM GOAL #6   Title  Pt will complete 10MWT with LAD with gait speed of at least 0.8 m/s for community ambulation.    Baseline  10MWT with RW 0.35 m/s, with lofstrand 0.36 m/s 01/06/2019    Time  4    Period  Weeks    Status  On-going    Target Date  03/26/19      PT LONG TERM GOAL #7   Title  Pt will perform 5xSTS in at least 15 seconds to demonstrate increased LE power.    Baseline   5xSTS with unilateral UE support to rise 23 seconds 01/06/2019.  11/20: 28.3 seconds.    Time  4    Period  Weeks    Status  On-going    Target Date  03/26/19            Plan - 03/17/19 1125    Clinical Impression Statement  PT discussed progression of ther.ex. to more of a gym based/ independent program with Pilar Plate (Physiological scientist).  Pt. agreed and will be discharged from PT next tx. session after goals reassessed.  Pt. worked really hard during PT tx. session and had minimal difficulty getting R LE into drivers side of vehicle.  Pt. requires use of door handle to position hips back on drivers seat before lifting up R hip/LE into car.  No increase c/o pain reported during tx. session.    Personal Factors and Comorbidities  Age;Comorbidity 3+    Comorbidities  CPD, HTN, Obesity    Stability/Clinical Decision Making  Evolving/Moderate complexity    Clinical Decision Making  Moderate    Rehab Potential  Good    Clinical Impairments Affecting Rehab Potential  positive: motivation, family support, negative: co-morbidities, deconditioned, high fall risk; Body structures that will need to be addressed: weakness, numbness/tingling, impaired balance, difficulty walking, decreased transfer ability, etc; Due to co-morbidities her clinical presenstation is evolving;     PT Frequency  2x / week    PT Duration  4 weeks    PT Treatment/Interventions  Cryotherapy;Electrical Stimulation;Ultrasound;Moist Heat;Gait training;Functional mobility training;Therapeutic activities;Therapeutic exercise;Neuromuscular re-education;Scar mobilization;Balance training;Stair training;Patient/family education;Manual techniques    PT Next Visit Plan  Probable discharge next tx. session with focus on gym based ex. at [facility].    PT Home Exercise Plan  See prescribed HEP.     Consulted and Agree with Plan of Care  Patient       Patient will benefit from skilled therapeutic intervention in order to improve the following  deficits and impairments:  Decreased endurance, Decreased range of motion, Decreased skin integrity, Decreased strength, Hypomobility, Impaired UE functional use, Increased fascial restricitons, Pain, Postural dysfunction, Impaired flexibility, Decreased scar mobility, Decreased mobility, Decreased balance, Improper body mechanics, Abnormal gait  Visit Diagnosis: Muscle weakness (generalized)  Gait difficulty  Abnormal posture     Problem List Patient Active Problem List   Diagnosis Date Noted  . S/P reverse total shoulder arthroplasty, 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  . Chronic diastolic heart failure ([place]) 08/09/2016  . COPD (chronic obstructive pulmonary disease) ([place]) 08/09/2016  . Pulmonary infiltrates   . Pneumonia due to Streptococcus pneumoniae ([place])   . Fever   . Community acquired pneumonia of right lung   . Palliative care by specialist   . Goals of care, counseling/discussion   . Pressure injury of skin 07/03/2016  . COPD exacerbation ([place]) 07/21/2015  . Acute renal insufficiency 07/21/2015  . Hyperglycemia 07/21/2015  . Benign essential HTN 07/21/2015  . S/P shoulder replacement 07/02/2013   Pura Spice, PT, DPT # [phone] 03/18/2019, 11:24 AM  [facility] [address] [place], [name], [address] Phone: [phone]   Fax:  [phone]  Name: Tyler Robidoux MRN: [id number] Date of Birth: 1945-05-17

## 2019-03-19 ENCOUNTER — Other Ambulatory Visit: Payer: Self-pay

## 2019-03-19 ENCOUNTER — Ambulatory Visit: Payer: Medicare HMO | Admitting: Physical Therapy

## 2019-03-19 ENCOUNTER — Encounter: Payer: Self-pay | Admitting: Physical Therapy

## 2019-03-19 DIAGNOSIS — R269 Unspecified abnormalities of gait and mobility: Secondary | ICD-10-CM

## 2019-03-19 DIAGNOSIS — M6281 Muscle weakness (generalized): Secondary | ICD-10-CM | POA: Diagnosis not present

## 2019-03-19 DIAGNOSIS — R293 Abnormal posture: Secondary | ICD-10-CM | POA: Diagnosis not present

## 2019-03-19 NOTE — Therapy (Signed)
[facility] [address] [place], [name], [address] Phone: [phone]   Fax:  [phone]  Physical Therapy Treatment  Patient Details  Name: Hannah Powell MRN: [id number] Date of Birth: [birth year] Referring Provider (PT): Dr. Ramonita Lab III   Encounter Date: 03/19/2019  PT End of Session - 03/19/19 0854    Visit Number  39    Number of Visits  43    Date for PT Re-Evaluation  03/26/19    Authorization - Visit Number  4    Authorization - Number of Visits  10    PT Start Time  5170    PT Stop Time  0942    PT Time Calculation (min)  48 min    Equipment Utilized During Treatment  Gait belt    Activity Tolerance  Patient tolerated treatment well;Patient limited by fatigue    Behavior During Therapy  [facility] for tasks assessed/performed       Past Medical History:  Diagnosis Date  . Anxiety   . Arthritis   . CHF (congestive heart failure) ([place])   . COPD (chronic obstructive pulmonary disease) ([place])   . Edema    feet/legs  . Hypertension    dr Otho Najjar     . Hypothyroidism   . Leg weakness, bilateral   . Osteoporosis   . Oxygen deficit    2l with bipap at night  . RLS (restless legs syndrome)   . Shortness of breath   . Sleep apnea    bipap  . Wheezing     Past Surgical History:  Procedure Laterality Date  . BACK SURGERY     cervical  . BREAST BIOPSY Left    needle bx-neg  . CATARACT EXTRACTION W/PHACO Left 05/07/2017   Procedure: CATARACT EXTRACTION PHACO AND INTRAOCULAR LENS PLACEMENT (IOC);  Surgeon: Birder Robson, MD;  Location: [facility];  Service: Ophthalmology;  Laterality: Left;  [place] 00:48.7 AP% 11.3 CDE 5.46 Fluid Pack Lot # [id number]  . CATARACT EXTRACTION W/PHACO Right 04/23/2017   Procedure: CATARACT EXTRACTION PHACO AND INTRAOCULAR LENS PLACEMENT ([place]);  Surgeon: Birder Robson, MD;  Location: [facility];  Service: Ophthalmology;  Laterality: Right;  [place] 00:29 AP% 12.8 CDE 3.79 FLUID PACK LOT  # [id number]  . FOOT ARTHROPLASTY    . JOINT REPLACEMENT     x2 tkr  . TOTAL SHOULDER ARTHROPLASTY Left 07/02/2013   Procedure: LEFT TOTAL SHOULDER ARTHROPLASTY;  Surgeon: Marin Shutter, MD;  Location: [facility];  Service: Orthopedics;  Laterality: Left;  . TOTAL SHOULDER ARTHROPLASTY Right 04/10/2018   Procedure: TOTAL REVERSE SHOULDER ARTHROPLASTY;  Surgeon: Justice Britain, MD;  [facility];  Service: Orthopedics;  Laterality: Right;  124mn    There were no vitals filed for this visit.  Subjective Assessment - 03/19/19 0853    Subjective  Pt. states she is doing okay.  No new complaints.  [facility] [employer]    Patient is accompained by:  Family member    Pertinent History  extensive PMH including intensive care unit stays, rehab facility stays, previously ambulated with RW.. COPD, SOB, HTN. Denies CHF, reported she has been cleared by a cardiologist.    Limitations  Walking;Lifting;Standing;Writing;House hold activities    How long can you sit comfortably?  unlimited    How long can you stand comfortably?  3-4 hours    How long can you walk comfortably?  3-4 hours  Diagnostic tests  last x-rays of left knee where in November 2016 which looked good;     Patient Stated Goals  Increase B LE muscle strength/ safety with gait    Currently in Pain?  No/denies      O2 sat: 94% HR: 88 bpm    Therapeutic ex.:  [id number]. B UE/LE (warm-up).  5# ankle wts.: Seated hip flexion, LAQ, heel toe raises/ marching20x. Standing hip ex.5#(all planes)- 20x.with standing.  Walking in //-bars with 5# ankle wts. With cuing to increase hip flexion/ step length and maintain upright posture with //-bars assist.  Lateral walking with 5# ankle wts. Working on hip abduction 6x each.    Sit to stands from chair at //-bars with mirror feedback (cuing for eccentric quad control).  Goal reassessment: improved hip flexor  strength.  Walking in clinic/ maneuvering cones/ obstacles  Discussed gym based/ aquatic ex.      PT Education - 03/19/19 [phone]   Education provided  Yes    Education Details  Reviewed HEP/ gym based ex.    Person(s) Educated  Patient    Methods  Explanation;Demonstration    Comprehension  Verbalized understanding;Returned demonstration       PT Short Term Goals - 08/12/18 1236      PT SHORT TERM GOAL #1   Title  Pt. will increase AAROM with ER to 30 degrees in neutral  without increase in pain to regain independent self-care tasks.     Baseline  30 deg. ER on R.      Time  4    Period  Weeks    Status  Achieved    Target Date  06/05/18        PT Long Term Goals - 03/19/19 1153      PT LONG TERM GOAL #1   Title  Pt. will increase FOTO to 56 to improve functional mobility.    Baseline  Initial FOTO: 51; FOTO 57 10/06/2018; Pt FOTO 61 11/06/2018    Time  4    Period  Weeks    Status  Achieved      PT LONG TERM GOAL #2   Title  Pt. will be independent with HEP to increase B LE muscle strength 1/2 muscle grade to improve standing tolerance/ gait.    Baseline  B LE AROM: limited L ankle AROM as compared to R ankle. B LE muscle weakness: hip flexor 4/5 MMT, quads/ hamstring 4+/5 MMT, hip abduction 4+/5 MMT, hip adduction 4/5 MMT, ankle 4/5 MMT except PF 3+/5 MMT.; L/R: Hip flexion 4/5, 4+/5; Knee extension 5/5 B Knee flexion 5/5 B' DF 5/5 B 10/06/2018; Pt LE strength grossly 5/5 except R hip flexoin 4+/5 11/06/2018; Pt B LE strength grossly 5/5 12/04/2018    Time  4    Period  Weeks    Status  Achieved      PT LONG TERM GOAL #3   Title  Pt. will demonstrate improved upright posture for >15 minutes with standing ther.ex.    Baseline  Poor standing posture/ forward head/ rounded sh; Pt maintained improved upright posture for 10 minutes before requiring cuing 10/06/2018; pt able to self correct posture without cuing ~50% of time, demonstrating improved upright posture with  standing activities still within still for ~10 min 11/04/2018; Pt requires frequent cuing today for upright posture with standing exercises (required cuing within <5 min of starting exercises) 12/04/2018; Pt requires frequent cueing for upright posture with standing exercises 01/06/2019.  12/10: pt.  fatigues with upright posture/ prolonged standing.  Pt. able to correct for short distances.    Time  4    Period  Weeks    Status  Partially Met    Target Date  03/19/19      PT LONG TERM GOAL #4   Title  Pt. able to ambulate outside with consistent hip flexion/ heel strike and least restrictive assistive device safely.    Baseline  Pt. ambulates with RW with limited hip flexion/ increase fall risk; Pt with improved heelstrike, but inconsistend hip flexion 10/06/2018; Pt still requires ~60-70% cuing for consistent hip flexion, heel strike and upright posture with RW 11/06/2018; Unable to assess outside d/t weather, however, pt requires cuing for hip flexion/step length/posture during obstacle course with RW approx 50% of time, >70% with hemiwalker 12/04/2018. Pt able to ambulate outside with consistent heel strike with minimal cueing for increased step length with RW 01/06/2019    Time  4    Period  Weeks    Status  Achieved      PT LONG TERM GOAL #5   Title  Pt will ambulate with consistent reciprocal step pattern 100% of the time with minor cuing instead of step-to gait pattern with walker to improve mobility gait mechanics.    Baseline  Pt requires cuing ~80% of the time to increase step length; pt requiring cuing ~50-60% time to increase step length 11/04/2018; pt requires cuing for increased step-length (to avoid step-to pattern) approx 50% of time with RW and >70% with hemiwalker. 12/04/2018. Pt ambulates with reciprocal step through pattern with RW 01/06/2019    Time  4    Period  Weeks    Status  Achieved      PT LONG TERM GOAL #6   Title  Pt will complete 10MWT with LAD with gait speed of at least  0.8 m/s for community ambulation.    Baseline  10MWT with RW 0.35 m/s, with lofstrand 0.36 m/s 01/06/2019    Time  4    Period  Weeks    Status  On-going      PT LONG TERM GOAL #7   Title  Pt will perform 5xSTS in at least 15 seconds to demonstrate increased LE power.    Baseline  5xSTS with unilateral UE support to rise 23 seconds 01/06/2019.  11/20: 28.3 seconds.  12/31: 22 seconds (max potential)    Time  4    Period  Weeks    Status  Not Met    Target Date  03/19/19            Plan - 03/19/19 5790    Clinical Impression Statement  Pt. has progressed well with skilled PT services and will be discharged from PT at this time.  Pt. will continue with aquatic ex. program at [facility] and with Pilar Plate (Physiological scientist) at gym.  Pt. instructed to contract PT if any regression or change in status.  See updated goals.    Personal Factors and Comorbidities  Age;Comorbidity 3+    Comorbidities  CPD, HTN, Obesity    Stability/Clinical Decision Making  Evolving/Moderate complexity    Clinical Decision Making  Moderate    Rehab Potential  Good    Clinical Impairments Affecting Rehab Potential  positive: motivation, family support, negative: co-morbidities, deconditioned, high fall risk; Body structures that will need to be addressed: weakness, numbness/tingling, impaired balance, difficulty walking, decreased transfer ability, etc; Due to co-morbidities her clinical presenstation is evolving;  PT Frequency  2x / week    PT Duration  4 weeks    PT Treatment/Interventions  Cryotherapy;Electrical Stimulation;Ultrasound;Moist Heat;Gait training;Functional mobility training;Therapeutic activities;Therapeutic exercise;Neuromuscular re-education;Scar mobilization;Balance training;Stair training;Patient/family education;Manual techniques    PT Next Visit Plan  Discharge visit with focus on gym based ex. at [employer].    PT Home Exercise Plan  See prescribed HEP.     Consulted and Agree with Plan  of Care  Patient       Patient will benefit from skilled therapeutic intervention in order to improve the following deficits and impairments:  Decreased endurance, Decreased range of motion, Decreased skin integrity, Decreased strength, Hypomobility, Impaired UE functional use, Increased fascial restricitons, Pain, Postural dysfunction, Impaired flexibility, Decreased scar mobility, Decreased mobility, Decreased balance, Improper body mechanics, Abnormal gait  Visit Diagnosis: Muscle weakness (generalized)  Gait difficulty  Abnormal posture     Problem List Patient Active Problem List   Diagnosis Date Noted  . S/P reverse total shoulder arthroplasty, 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  . Chronic diastolic heart failure ([place]) 08/09/2016  . COPD (chronic obstructive pulmonary disease) ([place]) 08/09/2016  . Pulmonary infiltrates   . Pneumonia due to Streptococcus pneumoniae ([place])   . Fever   . Community acquired pneumonia of right lung   . Palliative care by specialist   . Goals of care, counseling/discussion   . Pressure injury of skin 07/03/2016  . COPD exacerbation ([place]) 07/21/2015  . Acute renal insufficiency 07/21/2015  . Hyperglycemia 07/21/2015  . Benign essential HTN 07/21/2015  . S/P shoulder replacement 07/02/2013   Pura Spice, PT, DPT # [id number] 03/19/2019, 11:55 AM  [place] [facility] [address] [place], [name], [address] Phone: [phone]   Fax:  [phone]  Name: Hannah Powell MRN: [id number] Date of Birth: [birth year]

## 2019-04-13 DIAGNOSIS — Z96611 Presence of right artificial shoulder joint: Secondary | ICD-10-CM | POA: Diagnosis not present

## 2019-04-13 DIAGNOSIS — Z471 Aftercare following joint replacement surgery: Secondary | ICD-10-CM | POA: Diagnosis not present

## 2019-04-22 DIAGNOSIS — D239 Other benign neoplasm of skin, unspecified: Secondary | ICD-10-CM

## 2019-04-22 DIAGNOSIS — L578 Other skin changes due to chronic exposure to nonionizing radiation: Secondary | ICD-10-CM | POA: Diagnosis not present

## 2019-04-22 DIAGNOSIS — L720 Epidermal cyst: Secondary | ICD-10-CM | POA: Diagnosis not present

## 2019-04-22 DIAGNOSIS — I872 Venous insufficiency (chronic) (peripheral): Secondary | ICD-10-CM | POA: Diagnosis not present

## 2019-04-22 DIAGNOSIS — D485 Neoplasm of uncertain behavior of skin: Secondary | ICD-10-CM | POA: Diagnosis not present

## 2019-04-22 DIAGNOSIS — Z1283 Encounter for screening for malignant neoplasm of skin: Secondary | ICD-10-CM | POA: Diagnosis not present

## 2019-04-22 DIAGNOSIS — L821 Other seborrheic keratosis: Secondary | ICD-10-CM | POA: Diagnosis not present

## 2019-04-22 DIAGNOSIS — D225 Melanocytic nevi of trunk: Secondary | ICD-10-CM | POA: Diagnosis not present

## 2019-04-22 DIAGNOSIS — D2371 Other benign neoplasm of skin of right lower limb, including hip: Secondary | ICD-10-CM | POA: Diagnosis not present

## 2019-04-22 DIAGNOSIS — D1800 Hemangioma unspecified site: Secondary | ICD-10-CM | POA: Diagnosis not present

## 2019-04-22 HISTORY — DX: Other benign neoplasm of skin, unspecified: D23.9

## 2019-05-05 DIAGNOSIS — I5032 Chronic diastolic (congestive) heart failure: Secondary | ICD-10-CM | POA: Diagnosis not present

## 2019-05-05 DIAGNOSIS — J9601 Acute respiratory failure with hypoxia: Secondary | ICD-10-CM | POA: Diagnosis not present

## 2019-05-05 DIAGNOSIS — J441 Chronic obstructive pulmonary disease with (acute) exacerbation: Secondary | ICD-10-CM | POA: Diagnosis not present

## 2019-05-05 DIAGNOSIS — J449 Chronic obstructive pulmonary disease, unspecified: Secondary | ICD-10-CM | POA: Diagnosis not present

## 2019-05-25 DIAGNOSIS — I110 Hypertensive heart disease with heart failure: Secondary | ICD-10-CM | POA: Diagnosis not present

## 2019-05-25 DIAGNOSIS — I509 Heart failure, unspecified: Secondary | ICD-10-CM | POA: Diagnosis not present

## 2019-05-25 DIAGNOSIS — J449 Chronic obstructive pulmonary disease, unspecified: Secondary | ICD-10-CM | POA: Diagnosis not present

## 2019-05-25 DIAGNOSIS — Z87891 Personal history of nicotine dependence: Secondary | ICD-10-CM | POA: Diagnosis not present

## 2019-05-25 DIAGNOSIS — J9611 Chronic respiratory failure with hypoxia: Secondary | ICD-10-CM | POA: Diagnosis not present

## 2019-05-25 DIAGNOSIS — R1013 Epigastric pain: Secondary | ICD-10-CM | POA: Diagnosis not present

## 2019-05-25 DIAGNOSIS — G4730 Sleep apnea, unspecified: Secondary | ICD-10-CM | POA: Diagnosis not present

## 2019-05-26 DIAGNOSIS — E7800 Pure hypercholesterolemia, unspecified: Secondary | ICD-10-CM | POA: Diagnosis not present

## 2019-05-26 DIAGNOSIS — J9611 Chronic respiratory failure with hypoxia: Secondary | ICD-10-CM | POA: Diagnosis not present

## 2019-05-26 DIAGNOSIS — E034 Atrophy of thyroid (acquired): Secondary | ICD-10-CM | POA: Diagnosis not present

## 2019-05-26 DIAGNOSIS — I10 Essential (primary) hypertension: Secondary | ICD-10-CM | POA: Diagnosis not present

## 2019-05-26 DIAGNOSIS — I5032 Chronic diastolic (congestive) heart failure: Secondary | ICD-10-CM | POA: Diagnosis not present

## 2019-05-29 DIAGNOSIS — E034 Atrophy of thyroid (acquired): Secondary | ICD-10-CM | POA: Diagnosis not present

## 2019-05-29 DIAGNOSIS — E7800 Pure hypercholesterolemia, unspecified: Secondary | ICD-10-CM | POA: Diagnosis not present

## 2019-05-29 DIAGNOSIS — I5032 Chronic diastolic (congestive) heart failure: Secondary | ICD-10-CM | POA: Diagnosis not present

## 2019-05-29 DIAGNOSIS — J9611 Chronic respiratory failure with hypoxia: Secondary | ICD-10-CM | POA: Diagnosis not present

## 2019-05-29 DIAGNOSIS — I10 Essential (primary) hypertension: Secondary | ICD-10-CM | POA: Diagnosis not present

## 2019-06-02 ENCOUNTER — Ambulatory Visit: Payer: Medicare HMO | Admitting: Internal Medicine

## 2019-06-02 ENCOUNTER — Other Ambulatory Visit: Payer: Self-pay

## 2019-06-02 ENCOUNTER — Encounter: Payer: Self-pay | Admitting: Internal Medicine

## 2019-06-02 VITALS — BP 124/72 | HR 83 | Ht 66.0 in | Wt 220.0 lb

## 2019-06-02 DIAGNOSIS — G4733 Obstructive sleep apnea (adult) (pediatric): Secondary | ICD-10-CM | POA: Diagnosis not present

## 2019-06-02 DIAGNOSIS — J449 Chronic obstructive pulmonary disease, unspecified: Secondary | ICD-10-CM

## 2019-06-02 MED ORDER — BREO ELLIPTA 200-25 MCG/INH IN AEPB: 1.0000 | INHALATION_SPRAY | Freq: Every day | RESPIRATORY_TRACT | 3 refills | Status: DC

## 2019-06-02 MED ORDER — BREO ELLIPTA 200-25 MCG/INH IN AEPB: 1.0000 | INHALATION_SPRAY | Freq: Every day | RESPIRATORY_TRACT | 0 refills | Status: DC

## 2019-06-02 NOTE — Progress Notes (Signed)
[facility] Consultation     Date: 06/02/2019,   MRN# [id number] Hannah Powell 27-Dec-1945     CHIEF COMPLAINT:  Follow up COPD  Follow up chronic resp failure   HISTORY OF PRESENT ILLNESS    Moderate to severe COPD stable at this time COPD requiring oxygen and BiPAP therapy for survival Remote smoker quit 1970s 2017 FEV1 was 52% predicted  Patient benefiting using her BiPAP at night She is tolerating well 100 percent compliance for days 57% compliance for more than 4 hours  No  COPD exacerbation at this time No evidence of heart failure at this time No evidence or signs of infection at this time No respiratory distress No fevers, chills, nausea, vomiting, diarrhea No evidence of lower extremity edema No evidence hemoptysis  Continues to take inhalers as prescribed and doing well        Current Outpatient Medications:  .  albuterol (PROVENTIL) (2.5 MG/3ML) 0.083% nebulizer solution, Take 2.5 mg by nebulization every 6 (six) hours as needed for wheezing or shortness of breath., Disp: , Rfl:  .  ALPRAZolam (XANAX) 0.25 MG tablet, Take 1 tablet (0.25 mg total) by mouth 3 (three) times daily as needed for anxiety., Disp: 30 tablet, Rfl: 0 .  azithromycin (ZITHROMAX) 250 MG tablet, TAKE 1 TABLET (250 MG TOTAL) BY MOUTH DAILY., Disp: 90 tablet, Rfl: 0 .  B Complex-C (B-COMPLEX WITH VITAMIN C) tablet, Take 1 tablet by mouth daily. , Disp: , Rfl:  .  Calcium Carbonate-Vitamin D (CALCIUM-VITAMIN D) 500-200 MG-UNIT per tablet, Take 1 tablet by mouth daily., Disp: , Rfl:  .  cyclobenzaprine (FLEXERIL) 10 MG tablet, Take 10 mg by mouth 3 (three) times daily as needed for muscle spasms. , Disp: , Rfl:  .  ferrous sulfate 325 (65 FE) MG tablet, Take 325 mg by mouth daily with breakfast., Disp: , Rfl:  .  fluticasone furoate-vilanterol (BREO ELLIPTA) 200-25 MCG/INH AEPB, Inhale 1 puff into the lungs daily., Disp: 60 each, Rfl: 0 .  furosemide (LASIX) 20 MG  tablet, Take 2 tablets (40 mg total) by mouth 2 (two) times daily. (Patient taking differently: Take 40-80 mg by mouth See admin instructions. Take 80 mg by mouth in the morning and take 40 mg by mouth at lunch), Disp: , Rfl:  .  gabapentin (NEURONTIN) 300 MG capsule, Take 300 mg by mouth at bedtime. , Disp: , Rfl:  .  HYDROcodone-acetaminophen (NORCO) 7.5-325 MG tablet, Take 1 tablet by mouth every 6 (six) hours as needed for moderate pain., Disp: 30 tablet, Rfl: 0 .  ibuprofen (ADVIL,MOTRIN) 200 MG tablet, Take 800 mg by mouth every 6 (six) hours as needed for headache or mild pain. , Disp: , Rfl:  .  ipratropium-albuterol (DUONEB) 0.5-2.5 (3) MG/3ML SOLN, Take 3 mLs by nebulization every 4 (four) hours as needed., Disp: , Rfl:  .  levothyroxine (SYNTHROID, LEVOTHROID) 200 MCG tablet, Take 200 mcg by mouth daily before breakfast., Disp: , Rfl:  .  loratadine-pseudoephedrine (CLARITIN-D 24-HOUR) 10-240 MG 24 hr tablet, Take 1 tablet by mouth daily as needed for allergies., Disp: , Rfl:  .  losartan (COZAAR) 25 MG tablet, Take 25 mg by mouth daily., Disp: , Rfl:  .  Multiple Vitamins-Minerals (MULTIVITAMIN WOMEN PO), Take 1 tablet by mouth daily., Disp: , Rfl:  .  Omega-3 Fatty Acids (OMEGA 3 PO), Take 1 capsule by mouth daily., Disp: , Rfl:  .  oxybutynin (DITROPAN) 5 MG tablet, Take 5 mg by mouth  3 (three) times daily., Disp: , Rfl:  .  potassium chloride SA (K-DUR,KLOR-CON) 20 MEQ tablet, Take 20 mEq by mouth 2 (two) times daily. , Disp: , Rfl:  .  pramipexole (MIRAPEX) 1 MG tablet, Take 4 mg by mouth at bedtime. , Disp: , Rfl:  .  spironolactone (ALDACTONE) 25 MG tablet, Take 25 mg by mouth 2 (two) times daily. , Disp: , Rfl:  .  topiramate (TOPAMAX) 100 MG tablet, Take 100 mg by mouth 2 (two) times daily., Disp: , Rfl:  .  traMADol (ULTRAM) 50 MG tablet, Take 1 tablet (50 mg total) by mouth every 8 (eight) hours as needed (for mild pain)., Disp: 30 tablet, Rfl: 0 .  zolpidem (AMBIEN) 10 MG  tablet, Take 5 mg by mouth at bedtime as needed for sleep., Disp: , Rfl:    Review of Systems:  Gen:  Denies  fever, sweats, chills weight loss  HEENT: Denies blurred vision, double vision, ear pain, eye pain, hearing loss, nose bleeds, sore throat Cardiac:  No dizziness, chest pain or heaviness, chest tightness,edema, No JVD Resp:   No cough, -sputum production, +shortness of breath,-wheezing, -hemoptysis,  Gi: Denies swallowing difficulty, stomach pain, nausea or vomiting, diarrhea, constipation, bowel incontinence Gu:  Denies bladder incontinence, burning urine Ext:   Denies Joint pain, stiffness or swelling Skin: Denies  skin rash, easy bruising or bleeding or hives Endoc:  Denies polyuria, polydipsia , polyphagia or weight change Psych:   Denies depression, insomnia or hallucinations  Other:  All other systems negative  BP 124/72 (BP Location: Left Arm, Cuff Size: Large)   Pulse 83   Ht 5\' 6"  (1.676 m)   Wt 220 lb (99.8 kg)   SpO2 98%   BMI 35.51 kg/m    Physical Examination:   General Appearance: No distress  Neuro:without focal findings,  speech normal,  HEENT: PERRLA, EOM intact.   Pulmonary: normal breath sounds, No wheezing.  CardiovascularNormal S1,S2.  No m/r/g.   Abdomen: Benign, Soft, non-tender. Renal:  No costovertebral tenderness  GU:  Not performed at this time. Endoc: No evident thyromegaly Skin:   warm, no rashes, no ecchymosis  Extremities: +edema PSYCHIATRIC: Mood, affect within normal limits.   ALL OTHER ROS ARE NEGATIVE      ASSESSMENT/PLAN    Moderate to severe COPD Gold stage D Seems to be stable at this time Needs BiPAP for survival History of recurrent bouts of COPD exacerbation On chronic azithromycin therapy and working well Continue inhaled steroids and long-acting beta agonist Albuterol as needed  Chronic respiratory failure hypoxic and hypercapnic With COPD and OSA Continue oxygen and BiPAP as prescribed Patient needs this  for survival Patient uses and benefits from BiPAP therapy which reduces her admissions to the hospital   OSA and COPD Patient uses and benefits from BiPAP therapy for survival  OSA and COPD Patient uses and benefits from BiPAP therapy for survival  Obesity -recommend significant weight loss -recommend changing diet  Deconditioned state -Recommend increased daily activity and exercise   Total time 24 minutes  COVID-19 EDUCATION: The signs and symptoms of COVID-19 were discussed with the patient and how to seek care for testing.  The importance of social distancing was discussed today. Hand Washing Techniques and avoid touching face was advised.  MEDICATION ADJUSTMENTS/LABS AND TESTS ORDERED: Continue BiPAP Continue oxygen Continue inhaler therapy  CURRENT MEDICATIONS REVIEWED AT LENGTH WITH PATIENT TODAY   Patient satisfied with Plan of action and management. All questions answered  Follow  up in 6 months    Embry Huss Patricia Pesa, M.D.  [facility]  Medical Director [place] Director [facility] Cardio-Pulmonary Department

## 2019-06-02 NOTE — Patient Instructions (Signed)
Continue BiPAP Continue inhalers as prescribed

## 2019-06-08 ENCOUNTER — Telehealth: Payer: Self-pay | Admitting: Internal Medicine

## 2019-06-08 NOTE — Telephone Encounter (Signed)
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

## 2019-07-03 DIAGNOSIS — J438 Other emphysema: Secondary | ICD-10-CM | POA: Diagnosis not present

## 2019-07-03 DIAGNOSIS — E034 Atrophy of thyroid (acquired): Secondary | ICD-10-CM | POA: Diagnosis not present

## 2019-07-03 DIAGNOSIS — N183 Chronic kidney disease, stage 3 unspecified: Secondary | ICD-10-CM | POA: Diagnosis not present

## 2019-07-03 DIAGNOSIS — M159 Polyosteoarthritis, unspecified: Secondary | ICD-10-CM | POA: Diagnosis not present

## 2019-07-03 DIAGNOSIS — M48062 Spinal stenosis, lumbar region with neurogenic claudication: Secondary | ICD-10-CM | POA: Diagnosis not present

## 2019-07-03 DIAGNOSIS — J9611 Chronic respiratory failure with hypoxia: Secondary | ICD-10-CM | POA: Diagnosis not present

## 2019-07-03 DIAGNOSIS — I5032 Chronic diastolic (congestive) heart failure: Secondary | ICD-10-CM | POA: Diagnosis not present

## 2019-07-03 DIAGNOSIS — I130 Hypertensive heart and chronic kidney disease with heart failure and stage 1 through stage 4 chronic kidney disease, or unspecified chronic kidney disease: Secondary | ICD-10-CM | POA: Diagnosis not present

## 2019-07-03 DIAGNOSIS — R152 Fecal urgency: Secondary | ICD-10-CM | POA: Diagnosis not present

## 2019-07-20 ENCOUNTER — Telehealth: Payer: Self-pay | Admitting: Internal Medicine

## 2019-07-20 NOTE — Telephone Encounter (Signed)
Called and spoke patient to let her know of Dr. Barbaraann Rondo. Patient expressed understanding. And let her know that I sent message to pharmacy team and we will wait to hear back from them as well.

## 2019-07-20 NOTE — Telephone Encounter (Signed)
Hey guys, Called and spoke with patient about her concerns. Her concern/question is if she uses [place] everyday and gets it refilled like prescribed it will put her in the doughnut hole and she does not want to do that.  She wants to know if she can use it PRN or if there is something else that she can do? She said all inhalers for her are Tier 3.

## 2019-07-20 NOTE — Telephone Encounter (Signed)
She can try PRN and see what happens to her breathing

## 2019-07-21 MED ORDER — BREO ELLIPTA 200-25 MCG/INH IN AEPB: 1.0000 | INHALATION_SPRAY | Freq: Every day | RESPIRATORY_TRACT | 3 refills | Status: AC

## 2019-07-21 NOTE — Telephone Encounter (Signed)
Called and spoke with patient letting her know about the recs from pharmacy team. Patient requested to have 1 inhaler sent to [facility] and she would pick it up. Advised patient that she could use inhaler PRN as we discussed yesterday and if her breathing changed or got worse for her to call the office and let us know and then we would see about doing [place] paperwork. Patient expressed understanding. Nothing further needed at this time

## 2019-07-21 NOTE — Telephone Encounter (Signed)
Patient's copay for Memory Dance is currently $45.00. We are unable to predict when she will fall into the donut hole. But most Medicare patients end up in the donut hole. Patient can apply for [place] patient assistance. Patient must meet program's income guidelines and patient's household must have spent $600 in prescription costs in 2021 for approval.

## 2019-07-23 DIAGNOSIS — R509 Fever, unspecified: Secondary | ICD-10-CM | POA: Diagnosis not present

## 2019-07-23 DIAGNOSIS — Z87891 Personal history of nicotine dependence: Secondary | ICD-10-CM | POA: Diagnosis not present

## 2019-07-23 DIAGNOSIS — J111 Influenza due to unidentified influenza virus with other respiratory manifestations: Secondary | ICD-10-CM | POA: Diagnosis not present

## 2019-07-23 DIAGNOSIS — Z03818 Encounter for observation for suspected exposure to other biological agents ruled out: Secondary | ICD-10-CM | POA: Diagnosis not present

## 2019-07-23 DIAGNOSIS — J449 Chronic obstructive pulmonary disease, unspecified: Secondary | ICD-10-CM | POA: Diagnosis not present

## 2019-07-28 ENCOUNTER — Other Ambulatory Visit: Payer: Self-pay | Admitting: Internal Medicine

## 2019-07-28 DIAGNOSIS — J438 Other emphysema: Secondary | ICD-10-CM | POA: Diagnosis not present

## 2019-07-28 DIAGNOSIS — M159 Polyosteoarthritis, unspecified: Secondary | ICD-10-CM | POA: Diagnosis not present

## 2019-07-28 DIAGNOSIS — J9611 Chronic respiratory failure with hypoxia: Secondary | ICD-10-CM | POA: Diagnosis not present

## 2019-07-28 DIAGNOSIS — N183 Chronic kidney disease, stage 3 unspecified: Secondary | ICD-10-CM | POA: Diagnosis not present

## 2019-07-28 DIAGNOSIS — G63 Polyneuropathy in diseases classified elsewhere: Secondary | ICD-10-CM | POA: Diagnosis not present

## 2019-07-28 DIAGNOSIS — I130 Hypertensive heart and chronic kidney disease with heart failure and stage 1 through stage 4 chronic kidney disease, or unspecified chronic kidney disease: Secondary | ICD-10-CM | POA: Diagnosis not present

## 2019-07-28 DIAGNOSIS — I5032 Chronic diastolic (congestive) heart failure: Secondary | ICD-10-CM | POA: Diagnosis not present

## 2019-07-28 DIAGNOSIS — M48062 Spinal stenosis, lumbar region with neurogenic claudication: Secondary | ICD-10-CM | POA: Diagnosis not present

## 2019-07-28 DIAGNOSIS — M4807 Spinal stenosis, lumbosacral region: Secondary | ICD-10-CM

## 2019-07-29 ENCOUNTER — Other Ambulatory Visit: Payer: Self-pay | Admitting: Internal Medicine

## 2019-07-30 DIAGNOSIS — Z96611 Presence of right artificial shoulder joint: Secondary | ICD-10-CM | POA: Diagnosis not present

## 2019-07-30 DIAGNOSIS — I5032 Chronic diastolic (congestive) heart failure: Secondary | ICD-10-CM | POA: Diagnosis not present

## 2019-07-30 DIAGNOSIS — R0789 Other chest pain: Secondary | ICD-10-CM | POA: Diagnosis not present

## 2019-07-30 DIAGNOSIS — J9611 Chronic respiratory failure with hypoxia: Secondary | ICD-10-CM | POA: Diagnosis not present

## 2019-08-18 DIAGNOSIS — M79674 Pain in right toe(s): Secondary | ICD-10-CM | POA: Diagnosis not present

## 2019-08-18 DIAGNOSIS — B351 Tinea unguium: Secondary | ICD-10-CM | POA: Diagnosis not present

## 2019-08-18 DIAGNOSIS — M2032 Hallux varus (acquired), left foot: Secondary | ICD-10-CM | POA: Diagnosis not present

## 2019-08-18 DIAGNOSIS — M79675 Pain in left toe(s): Secondary | ICD-10-CM | POA: Diagnosis not present

## 2019-08-18 DIAGNOSIS — M898X9 Other specified disorders of bone, unspecified site: Secondary | ICD-10-CM | POA: Diagnosis not present

## 2019-09-07 DIAGNOSIS — J449 Chronic obstructive pulmonary disease, unspecified: Secondary | ICD-10-CM | POA: Diagnosis not present

## 2019-09-07 DIAGNOSIS — J9620 Acute and chronic respiratory failure, unspecified whether with hypoxia or hypercapnia: Secondary | ICD-10-CM | POA: Diagnosis not present

## 2019-09-07 DIAGNOSIS — J9610 Chronic respiratory failure, unspecified whether with hypoxia or hypercapnia: Secondary | ICD-10-CM | POA: Diagnosis not present

## 2019-09-07 DIAGNOSIS — Z96611 Presence of right artificial shoulder joint: Secondary | ICD-10-CM | POA: Diagnosis not present

## 2019-09-07 DIAGNOSIS — I5032 Chronic diastolic (congestive) heart failure: Secondary | ICD-10-CM | POA: Diagnosis not present

## 2019-09-07 DIAGNOSIS — J441 Chronic obstructive pulmonary disease with (acute) exacerbation: Secondary | ICD-10-CM | POA: Diagnosis not present

## 2019-09-07 DIAGNOSIS — J9601 Acute respiratory failure with hypoxia: Secondary | ICD-10-CM | POA: Diagnosis not present

## 2019-09-11 DIAGNOSIS — Z96653 Presence of artificial knee joint, bilateral: Secondary | ICD-10-CM | POA: Diagnosis not present

## 2019-09-12 ENCOUNTER — Other Ambulatory Visit: Payer: Self-pay

## 2019-09-12 ENCOUNTER — Ambulatory Visit
Admission: RE | Admit: 2019-09-12 | Discharge: 2019-09-12 | Disposition: A | Discharge status: Home / Self Care | Payer: Medicare HMO | Source: Ambulatory Visit | Attending: Internal Medicine | Admitting: Internal Medicine

## 2019-09-12 DIAGNOSIS — M4807 Spinal stenosis, lumbosacral region: Secondary | ICD-10-CM

## 2019-09-12 DIAGNOSIS — M48061 Spinal stenosis, lumbar region without neurogenic claudication: Secondary | ICD-10-CM | POA: Diagnosis not present

## 2019-09-16 DIAGNOSIS — H26491 Other secondary cataract, right eye: Secondary | ICD-10-CM | POA: Diagnosis not present

## 2019-09-23 DIAGNOSIS — G4733 Obstructive sleep apnea (adult) (pediatric): Secondary | ICD-10-CM | POA: Diagnosis not present

## 2019-09-23 DIAGNOSIS — E034 Atrophy of thyroid (acquired): Secondary | ICD-10-CM | POA: Diagnosis not present

## 2019-09-23 DIAGNOSIS — E7800 Pure hypercholesterolemia, unspecified: Secondary | ICD-10-CM | POA: Diagnosis not present

## 2019-09-23 DIAGNOSIS — I10 Essential (primary) hypertension: Secondary | ICD-10-CM | POA: Diagnosis not present

## 2019-09-23 DIAGNOSIS — J438 Other emphysema: Secondary | ICD-10-CM | POA: Diagnosis not present

## 2019-09-23 DIAGNOSIS — Z79899 Other long term (current) drug therapy: Secondary | ICD-10-CM | POA: Diagnosis not present

## 2019-09-28 DIAGNOSIS — G63 Polyneuropathy in diseases classified elsewhere: Secondary | ICD-10-CM | POA: Diagnosis not present

## 2019-09-28 DIAGNOSIS — J438 Other emphysema: Secondary | ICD-10-CM | POA: Diagnosis not present

## 2019-09-28 DIAGNOSIS — I130 Hypertensive heart and chronic kidney disease with heart failure and stage 1 through stage 4 chronic kidney disease, or unspecified chronic kidney disease: Secondary | ICD-10-CM | POA: Diagnosis not present

## 2019-09-28 DIAGNOSIS — I5032 Chronic diastolic (congestive) heart failure: Secondary | ICD-10-CM | POA: Diagnosis not present

## 2019-09-28 DIAGNOSIS — E034 Atrophy of thyroid (acquired): Secondary | ICD-10-CM | POA: Diagnosis not present

## 2019-09-28 DIAGNOSIS — J9611 Chronic respiratory failure with hypoxia: Secondary | ICD-10-CM | POA: Diagnosis not present

## 2019-09-28 DIAGNOSIS — G4733 Obstructive sleep apnea (adult) (pediatric): Secondary | ICD-10-CM | POA: Diagnosis not present

## 2019-09-28 DIAGNOSIS — Z79899 Other long term (current) drug therapy: Secondary | ICD-10-CM | POA: Diagnosis not present

## 2019-09-28 DIAGNOSIS — Z0000 Encounter for general adult medical examination without abnormal findings: Secondary | ICD-10-CM | POA: Diagnosis not present

## 2019-10-06 ENCOUNTER — Ambulatory Visit: Payer: Medicare HMO | Attending: Internal Medicine | Admitting: Physical Therapy

## 2019-10-06 ENCOUNTER — Encounter: Payer: Self-pay | Admitting: Physical Therapy

## 2019-10-06 ENCOUNTER — Other Ambulatory Visit: Payer: Self-pay

## 2019-10-06 DIAGNOSIS — R293 Abnormal posture: Secondary | ICD-10-CM

## 2019-10-06 DIAGNOSIS — R269 Unspecified abnormalities of gait and mobility: Secondary | ICD-10-CM | POA: Diagnosis not present

## 2019-10-06 DIAGNOSIS — R2689 Other abnormalities of gait and mobility: Secondary | ICD-10-CM | POA: Diagnosis not present

## 2019-10-06 DIAGNOSIS — M6281 Muscle weakness (generalized): Secondary | ICD-10-CM | POA: Diagnosis not present

## 2019-10-06 NOTE — Therapy (Signed)
[facility] [address] [place], [name], [address] Phone: [phone]   Fax:  [phone]  Physical Therapy Evaluation  Patient Details  Name: Hannah Powell MRN: [id number] Date of Birth: [birth year] Referring Provider (PT): Dr. Ramonita Lab III   Encounter Date: 10/06/2019  Treatment: 1 of 9.  Recert date: 7/34/1937 1450 to 1546  Past Medical History:  Diagnosis Date  . Anxiety   . Arthritis   . CHF (congestive heart failure) ([place])   . COPD (chronic obstructive pulmonary disease) ([place])   . Edema    feet/legs  . Hypertension    dr Otho Najjar     . Hypothyroidism   . Leg weakness, bilateral   . Osteoporosis   . Oxygen deficit    2l with bipap at night  . RLS (restless legs syndrome)   . Shortness of breath   . Sleep apnea    bipap  . Wheezing     Past Surgical History:  Procedure Laterality Date  . BACK SURGERY     cervical  . BREAST BIOPSY Left    needle bx-neg  . CATARACT EXTRACTION W/PHACO Left 05/07/2017   Procedure: CATARACT EXTRACTION PHACO AND INTRAOCULAR LENS PLACEMENT (IOC);  Surgeon: Birder Robson, MD;  Location: [facility];  Service: Ophthalmology;  Laterality: Left;  [place] 00:48.7 AP% 11.3 CDE 5.46 Fluid Pack Lot # [id number]  . CATARACT EXTRACTION W/PHACO Right 04/23/2017   Procedure: CATARACT EXTRACTION PHACO AND INTRAOCULAR LENS PLACEMENT ([place]);  Surgeon: Birder Robson, MD;  Location: [facility];  Service: Ophthalmology;  Laterality: Right;  [place] 00:29 AP% 12.8 CDE 3.79 FLUID PACK LOT # [id number]  . FOOT ARTHROPLASTY    . JOINT REPLACEMENT     x2 tkr  . TOTAL SHOULDER ARTHROPLASTY Left 07/02/2013   Procedure: LEFT TOTAL SHOULDER ARTHROPLASTY;  Surgeon: Marin Shutter, MD;  Location: [place];  Service: Orthopedics;  Laterality: Left;  . TOTAL SHOULDER ARTHROPLASTY Right 04/10/2018   Procedure: TOTAL REVERSE SHOULDER ARTHROPLASTY;  Surgeon: Justice Britain, MD;  Location: [facility];  Service: Orthopedics;   Laterality: Right;  167min    There were no vitals filed for this visit.    Pt is a 74 y.o. female referred to PT for B LE weakness 2/2 lumbar stenosis. Pt reports 0/10 pain via NPS and reports BLE weakness as her symptoms. Pt's goals are to improve her balance and to be able to independently sit to stand form lower surfaces. Pt. says she experiences some numbness in bilat LE from knees down after sitting for a while. Pt. states her balance is the biggest issue she faces and would like to address in therapy.        OBJECTIVE   Mental Status Patient is oriented to person, place and time.  Recent memory is intact.  Remote memory is intact.  Attention span and concentration are intact.  Expressive speech is intact.  Patient's fund of knowledge is within normal limits for educational level.    MUSCULOSKELETAL: Tremor: None Bulk: Normal Tone: Normal   Posture Patient stands with increased lordosis, rounded shoulders, B hip adduction, B genu valgum, and mild pronation. Patient is generally guarded in sitting and standing.   Gait Patient ambulates with RW with decreased gait speed with concurrent L shoulder hike and R hip drop during R stance. Increased stance time on R leg. Forward head posture and forward trunk lean and relies heavily on BUE support on RW     Strength (out of  5) R/L 4+/4 Hip flexion 5/5 Hip abduction (seated) 5/5 Hip adduction (seated) 5/5 Knee extension 4+/4 Knee flexion 5/5 Ankle dorsiflexion 4+/4 Ankle Inversion  4/5 Ankle Eversion *Indicates pain     SPECIAL TESTS FOTO: 57, with typical result of 55  5xSTS: 20.17 sec 10 m gait speed: 0.289 m/s (fast) 10 m gait speed: 0.271 m/s (normal speed)  Berg: 28/56        Objective measurements completed on examination: See above findings.    There ex:  Nustep L4 10 mins Reviewed HEP      PT Long Term Goals - 10/06/19 1642      PT LONG TERM GOAL #1   Title Pt. will improve 5XSTS to <15  sec to improve functional mobility.    Baseline initial: 20.17s    Time 4    Period Weeks    Status New    Target Date 11/03/19      PT LONG TERM GOAL #2   Title Pt. improve Berg balance score to >45/56 to be safe ambulating with SPC to improve independence.    Baseline Initial: 28/56    Time 4    Period Weeks    Status New    Target Date 11/03/19      PT LONG TERM GOAL #3   Title Pt. will demonstrate improved normal gait speed to 0.4 m/s to decrease falls risk and improve ability to safely walk in community.    Baseline initial: 0.276m/s    Time 4    Period Weeks    Status New    Target Date 11/03/19              Pt. is a 74 y.o. woman with spinal stenosis and neurogenic claudication. Pt. has good strength in BLE, with 5/5 strength in bilat. hip add and abd, bilat DF, and L eversion. Pt. has 4+/5 strength in R hip flex, R knee flex, and R inversion. Pt. has 4/5 strength with L hip flex, L knee flex, R eversion, and L inverison. Pt. has functional ROM of LE. Pt. reports numbness in bilat LE from knees down to feet after sitting for longer periods of time. Pt. completed Berg balance scale and scored 28/56. Pt. completed 5xSTS with 20.17 sec. Pt.'s gait demonstrates following abnormalities: increased L shoulder hike and R hip drop during R stance phase, severe forward trunk lean, severe UE dependency on RW, increased stance time on R leg, and decreased gait speed. Pt. completed 10 meter walk test, demonstrating fast gait speed of 0.22m/s, and normal gait speed of 0.274m/s. Pt. will benefit from skilled PT to address gait abnormalities, improve balance, and improve strength to decrease falls risk and improve functional mobility.        Patient will benefit from skilled therapeutic intervention in order to improve the following deficits and impairments:  Decreased endurance, Decreased range of motion, Decreased skin integrity, Decreased strength, Hypomobility, Impaired UE functional  use, Increased fascial restricitons, Pain, Postural dysfunction, Impaired flexibility, Decreased scar mobility, Decreased mobility, Decreased balance, Improper body mechanics, Abnormal gait, Decreased activity tolerance, Impaired sensation  Visit Diagnosis: Muscle weakness (generalized)  Imbalance  Gait difficulty  Abnormal posture     Problem List Patient Active Problem List   Diagnosis Date Noted  . S/P reverse total shoulder arthroplasty, 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  . Chronic diastolic heart failure ([place]) 08/09/2016  . COPD (chronic obstructive pulmonary disease) ([place]) 08/09/2016  . Pulmonary infiltrates   . Pneumonia due to Streptococcus pneumoniae ([place])   .  Fever   . Community acquired pneumonia of right lung   . Palliative care by specialist   . Goals of care, counseling/discussion   . Pressure injury of skin 07/03/2016  . COPD exacerbation ([place]) 07/21/2015  . Acute renal insufficiency 07/21/2015  . Hyperglycemia 07/21/2015  . Benign essential HTN 07/21/2015  . S/P shoulder replacement 07/02/2013   Pura Spice, PT, DPT # [id number] Carlyle Basques, SPT 10/09/2019, 3:39 PM  [facility] [address] [place], [name], [address] Phone: [phone]   Fax:  [phone]  Name: Hannah Powell MRN: [id number] Date of Birth: September 28, 1945

## 2019-10-07 DIAGNOSIS — Z96611 Presence of right artificial shoulder joint: Secondary | ICD-10-CM | POA: Diagnosis not present

## 2019-10-07 DIAGNOSIS — J9620 Acute and chronic respiratory failure, unspecified whether with hypoxia or hypercapnia: Secondary | ICD-10-CM | POA: Diagnosis not present

## 2019-10-07 DIAGNOSIS — J9610 Chronic respiratory failure, unspecified whether with hypoxia or hypercapnia: Secondary | ICD-10-CM | POA: Diagnosis not present

## 2019-10-07 DIAGNOSIS — J441 Chronic obstructive pulmonary disease with (acute) exacerbation: Secondary | ICD-10-CM | POA: Diagnosis not present

## 2019-10-07 DIAGNOSIS — I5032 Chronic diastolic (congestive) heart failure: Secondary | ICD-10-CM | POA: Diagnosis not present

## 2019-10-07 DIAGNOSIS — J9601 Acute respiratory failure with hypoxia: Secondary | ICD-10-CM | POA: Diagnosis not present

## 2019-10-07 DIAGNOSIS — J449 Chronic obstructive pulmonary disease, unspecified: Secondary | ICD-10-CM | POA: Diagnosis not present

## 2019-10-08 ENCOUNTER — Ambulatory Visit: Payer: Medicare HMO | Admitting: Physical Therapy

## 2019-10-08 ENCOUNTER — Other Ambulatory Visit: Payer: Self-pay

## 2019-10-08 ENCOUNTER — Encounter: Payer: Self-pay | Admitting: Physical Therapy

## 2019-10-08 DIAGNOSIS — R2689 Other abnormalities of gait and mobility: Secondary | ICD-10-CM | POA: Diagnosis not present

## 2019-10-08 DIAGNOSIS — R293 Abnormal posture: Secondary | ICD-10-CM

## 2019-10-08 DIAGNOSIS — M6281 Muscle weakness (generalized): Secondary | ICD-10-CM

## 2019-10-08 DIAGNOSIS — R269 Unspecified abnormalities of gait and mobility: Secondary | ICD-10-CM

## 2019-10-08 NOTE — Therapy (Addendum)
[facility] [address] [place], [name], [address] Phone: [phone]   Fax:  [phone]  Physical Therapy Treatment  Patient Details  Name: Hannah Powell MRN: [id number] Date of Birth: 1945/05/13 Referring Provider (PT): Dr. Ramonita Lab III   Encounter Date: 10/08/2019   PT End of Session - 10/08/19 1519    Visit Number 2    Number of Visits 9    Date for PT Re-Evaluation 11/03/19    Authorization - Visit Number 2    Authorization - Number of Visits 10    PT Start Time 3151    PT Stop Time 1600    PT Time Calculation (min) 42 min    Activity Tolerance Patient tolerated treatment well;Patient limited by fatigue    Behavior During Therapy [facility] for tasks assessed/performed           Past Medical History:  Diagnosis Date   Anxiety    Arthritis    CHF (congestive heart failure) ([facility])    COPD (chronic obstructive pulmonary disease) ([place])    Edema    feet/legs   Hypertension    dr Otho Najjar      Hypothyroidism    Leg weakness, bilateral    Osteoporosis    Oxygen deficit    2l with bipap at night   RLS (restless legs syndrome)    Shortness of breath    Sleep apnea    bipap   Wheezing     Past Surgical History:  Procedure Laterality Date   BACK SURGERY     cervical   BREAST BIOPSY Left    needle bx-neg   CATARACT EXTRACTION W/PHACO Left 05/07/2017   Procedure: CATARACT EXTRACTION PHACO AND INTRAOCULAR LENS PLACEMENT ([place]);  Surgeon: Birder Robson, MD;  Location: [facility];  Service: Ophthalmology;  Laterality: Left;  [place] 00:48.7 AP% 11.3 CDE 5.46 Fluid Pack Lot # [id number] H   CATARACT EXTRACTION W/PHACO Right 04/23/2017   Procedure: CATARACT EXTRACTION PHACO AND INTRAOCULAR LENS PLACEMENT (IOC);  Surgeon: Birder Robson, MD;  Location: [facility];  Service: Ophthalmology;  Laterality: Right;  [place] 00:29 AP% 12.8 CDE 3.79 FLUID PACK LOT # [id number]   FOOT ARTHROPLASTY     JOINT REPLACEMENT      x2 tkr   TOTAL SHOULDER ARTHROPLASTY Left 07/02/2013   Procedure: LEFT TOTAL SHOULDER ARTHROPLASTY;  Surgeon: Marin Shutter, MD;  Location: [place];  Service: Orthopedics;  Laterality: Left;   TOTAL SHOULDER ARTHROPLASTY Right 04/10/2018   Procedure: TOTAL REVERSE SHOULDER ARTHROPLASTY;  Surgeon: Justice Britain, MD;  [facility];  Service: Orthopedics;  Laterality: Right;  136min    There were no vitals filed for this visit.   Subjective Assessment - 10/08/19 1520    Subjective Pt. able to turn in a circle since last session. Pt. reports no falls and no pain.    Pertinent History extensive PMH including intensive care unit stays, rehab facility stays, previously ambulated with RW.. COPD, SOB, HTN. Denies CHF, reported she has been cleared by a cardiologist.    Limitations Standing;Walking;House hold activities    Patient Stated Goals improve balance, be able to get up from floor, use SPC    Currently in Pain? No/denies           Neuro re-ed:  // bars:   Walking no AD: double hand support to start   Walking over blue mat: pt required double hand support   Side stepping: 5x  Step up and over 3":  pt demonstrated trendelenburg gait on R hip, cueing for increasing contraction on R leg during R stance phase.   Standing on airex in // bars: pt instructed to reach forward with alternating arms. Pt demonstrated increased difficulty with reaching.   There ex:  Nustep 10 mins L4  Seated RTB hip abd: 15x      PT Long Term Goals - 10/06/19 1642      PT LONG TERM GOAL #1   Title Pt. will improve 5XSTS to <15 sec to improve functional mobility.    Baseline initial: 20.17s    Time 4    Period Weeks    Status New    Target Date 11/03/19      PT LONG TERM GOAL #2   Title Pt. improve Berg balance score to >45/56 to be safe ambulating with SPC to improve independence.    Baseline Initial: 28/56    Time 4    Period Weeks    Status New    Target Date 11/03/19      PT  LONG TERM GOAL #3   Title Pt. will demonstrate improved normal gait speed to 0.4 m/s to decrease falls risk and improve ability to safely walk in community.    Baseline initial: 0.2105m/s    Time 4    Period Weeks    Status New    Target Date 11/03/19                 Plan - 10/08/19 1551    Clinical Impression Statement Pt. focused on dynamic gait and balance exercises. Pt. continues to demonstrate R trendelenberg gait with R shoulder hip hike, as well as severly forward head posture with thoracic kyphosis. Pt. unable to fully correct at this time with cueing. Pt. demonstrated difficulty with step ups when stepping up with RLE. Pt. able to complete 360 turn in // bars today as well. Pt. will continue to benefit from skilled PT to address balance and strength deficits to decrease falls risk,    Personal Factors and Comorbidities Age;Comorbidity 3+    Comorbidities CPD, HTN, Obesity    Stability/Clinical Decision Making Evolving/Moderate complexity    Clinical Decision Making Moderate    Rehab Potential Good    PT Frequency 2x / week    PT Duration 4 weeks    PT Treatment/Interventions Cryotherapy;Electrical Stimulation;Ultrasound;Moist Heat;Gait training;Functional mobility training;Therapeutic activities;Therapeutic exercise;Neuromuscular re-education;Scar mobilization;Balance training;Stair training;Patient/family education;Manual techniques    PT Next Visit Plan Progress strengthening/ balance with least assistive device.    Consulted and Agree with Plan of Care Patient           Patient will benefit from skilled therapeutic intervention in order to improve the following deficits and impairments:  Decreased endurance, Decreased range of motion, Decreased skin integrity, Decreased strength, Hypomobility, Impaired UE functional use, Increased fascial restricitons, Pain, Postural dysfunction, Impaired flexibility, Decreased scar mobility, Decreased mobility, Decreased balance,  Improper body mechanics, Abnormal gait, Decreased activity tolerance, Impaired sensation  Visit Diagnosis: Imbalance  Muscle weakness (generalized)  Gait difficulty  Abnormal posture     Problem List Patient Active Problem List   Diagnosis Date Noted   S/P reverse total shoulder arthroplasty, 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   Chronic diastolic heart failure ([place]) 08/09/2016   COPD (chronic obstructive pulmonary disease) ([place]) 08/09/2016   Pulmonary infiltrates    Pneumonia due to Streptococcus pneumoniae ([place])    Fever    Community acquired pneumonia of right lung    Palliative care by specialist  Goals of care, counseling/discussion    Pressure injury of skin 07/03/2016   COPD exacerbation ([place]) 07/21/2015   Acute renal insufficiency 07/21/2015   Hyperglycemia 07/21/2015   Benign essential HTN 07/21/2015   S/P shoulder replacement 07/02/2013   Pura Spice, PT, DPT # [id number] Carlyle Basques, SPT 10/09/2019, 7:29 PM  [facility] [address] [place], [name], [address] Phone: [phone]   Fax:  [phone]  Name: Hannah Powell MRN: [id number] Date of Birth: Jun 25, 1945

## 2019-10-13 ENCOUNTER — Other Ambulatory Visit: Payer: Self-pay

## 2019-10-13 ENCOUNTER — Encounter: Payer: Self-pay | Admitting: Physical Therapy

## 2019-10-13 ENCOUNTER — Ambulatory Visit: Payer: Medicare HMO | Admitting: Physical Therapy

## 2019-10-13 DIAGNOSIS — R2689 Other abnormalities of gait and mobility: Secondary | ICD-10-CM | POA: Diagnosis not present

## 2019-10-13 DIAGNOSIS — R269 Unspecified abnormalities of gait and mobility: Secondary | ICD-10-CM | POA: Diagnosis not present

## 2019-10-13 DIAGNOSIS — M6281 Muscle weakness (generalized): Secondary | ICD-10-CM

## 2019-10-13 DIAGNOSIS — R293 Abnormal posture: Secondary | ICD-10-CM | POA: Diagnosis not present

## 2019-10-14 NOTE — Therapy (Signed)
[facility] [address] [place], [name], [address] Phone: [phone]   Fax:  [phone]  Physical Therapy Treatment  Patient Details  Name: Hannah Powell MRN: [id number] Date of Birth: [birth year] Referring Provider (PT): Dr. Ramonita Lab III   Encounter Date: 10/13/2019   PT End of Session - 10/13/19 1540    Visit Number 3    Number of Visits 9    Date for PT Re-Evaluation 11/03/19    Authorization - Visit Number 3    Authorization - Number of Visits 10    PT Start Time 8144    PT Stop Time 1529    PT Time Calculation (min) 50 min    Activity Tolerance Patient tolerated treatment well;Patient limited by fatigue    Behavior During Therapy [facility] for tasks assessed/performed           Past Medical History:  Diagnosis Date  . Anxiety   . Arthritis   . CHF (congestive heart failure) ([place])   . COPD (chronic obstructive pulmonary disease) ([place])   . Edema    feet/legs  . Hypertension    dr Otho Najjar     . Hypothyroidism   . Leg weakness, bilateral   . Osteoporosis   . Oxygen deficit    2l with bipap at night  . RLS (restless legs syndrome)   . Shortness of breath   . Sleep apnea    bipap  . Wheezing     Past Surgical History:  Procedure Laterality Date  . BACK SURGERY     cervical  . BREAST BIOPSY Left    needle bx-neg  . CATARACT EXTRACTION W/PHACO Left 05/07/2017   Procedure: CATARACT EXTRACTION PHACO AND INTRAOCULAR LENS PLACEMENT (IOC);  Surgeon: Birder Robson, MD;  Location: [facility];  Service: Ophthalmology;  Laterality: Left;  [place] 00:48.7 AP% 11.3 CDE 5.46 Fluid Pack Lot # [id number]  . CATARACT EXTRACTION W/PHACO Right 04/23/2017   Procedure: CATARACT EXTRACTION PHACO AND INTRAOCULAR LENS PLACEMENT ([place]);  Surgeon: Birder Robson, MD;  Location: [facility];  Service: Ophthalmology;  Laterality: Right;  [place] 00:29 AP% 12.8 CDE 3.79 FLUID PACK LOT # [id number]  . FOOT ARTHROPLASTY    . JOINT REPLACEMENT      x2 tkr  . TOTAL SHOULDER ARTHROPLASTY Left 07/02/2013   Procedure: LEFT TOTAL SHOULDER ARTHROPLASTY;  Surgeon: Marin Shutter, MD;  Location: [place];  Service: Orthopedics;  Laterality: Left;  . TOTAL SHOULDER ARTHROPLASTY Right 04/10/2018   Procedure: TOTAL REVERSE SHOULDER ARTHROPLASTY;  Surgeon: Justice Britain, MD;  Location: [facility];  Service: Orthopedics;  Laterality: Right;  156min    There were no vitals filed for this visit.   Subjective Assessment - 10/13/19 1535    Subjective Pt. entered PT with use of RW and no new complaints.  Pt. has not been back to see personal trainer Pilar Plate since last PT tx. session.  Pt. returns to [employer].    Pertinent History extensive PMH including intensive care unit stays, rehab facility stays, previously ambulated with RW.. COPD, SOB, HTN. Denies CHF, reported she has been cleared by a cardiologist.    Limitations Standing;Walking;House hold activities    Patient Stated Goals improve balance, be able to get up from floor, use SPC    Currently in Pain? No/denies             There.ex.:  Nustep L4 10 min. B UE/LE (discussed HEP/ gym based. Ex.) Supine: L/R SLR (limited with 8  reps on L due to fatigue)- slight PT assist.  Bridging 10x2/ marching 10x2.  Supine B shoulder horizontal abduction/ pec stretches 10x.  Dead bug 10x (cuing to relax cervical muscles).  Neuro:  Standing balance in //-bar on Airex: forward reaching at varying planes.      PT Long Term Goals - 10/06/19 1642      PT LONG TERM GOAL #1   Title Pt. will improve 5XSTS to <15 sec to improve functional mobility.    Baseline initial: 20.17s    Time 4    Period Weeks    Status New    Target Date 11/03/19      PT LONG TERM GOAL #2   Title Pt. improve Berg balance score to >45/56 to be safe ambulating with SPC to improve independence.    Baseline Initial: 28/56    Time 4    Period Weeks    Status New    Target Date 11/03/19      PT LONG TERM GOAL #3   Title Pt.  will demonstrate improved normal gait speed to 0.4 m/s to decrease falls risk and improve ability to safely walk in community.    Baseline initial: 0.275m/s    Time 4    Period Weeks    Status New    Target Date 11/03/19                 Plan - 10/13/19 1541    Clinical Impression Statement Moderate L LE muscle fatigue during supine SLR after 8 reps.  Pt. instructed in posture correction in supine/ seated posture with moderate cuing to correct.  Pt. ambulates with forward head/rounded shoulders while looking at feet with use of RW.  Pt. able to correct and benefits from mirror feedback/cuing.  Good overall endurance on Nustep with use of B UE/LE at a consistent cadence.  No increaes c/o pain reported.    Personal Factors and Comorbidities Age;Comorbidity 3+    Comorbidities CPD, HTN, Obesity    Stability/Clinical Decision Making Evolving/Moderate complexity    Clinical Decision Making Moderate    Rehab Potential Good    PT Frequency 2x / week    PT Duration 4 weeks    PT Treatment/Interventions Cryotherapy;Electrical Stimulation;Ultrasound;Moist Heat;Gait training;Functional mobility training;Therapeutic activities;Therapeutic exercise;Neuromuscular re-education;Scar mobilization;Balance training;Stair training;Patient/family education;Manual techniques    PT Next Visit Plan Progress strengthening/ balance with least assistive device.    Consulted and Agree with Plan of Care Patient           Patient will benefit from skilled therapeutic intervention in order to improve the following deficits and impairments:  Decreased endurance, Decreased range of motion, Decreased skin integrity, Decreased strength, Hypomobility, Impaired UE functional use, Increased fascial restricitons, Pain, Postural dysfunction, Impaired flexibility, Decreased scar mobility, Decreased mobility, Decreased balance, Improper body mechanics, Abnormal gait, Decreased activity tolerance, Impaired sensation  Visit  Diagnosis: Imbalance  Muscle weakness (generalized)  Gait difficulty  Abnormal posture     Problem List Patient Active Problem List   Diagnosis Date Noted  . S/P reverse total shoulder arthroplasty, 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  . Chronic diastolic heart failure ([place]) 08/09/2016  . COPD (chronic obstructive pulmonary disease) ([place]) 08/09/2016  . Pulmonary infiltrates   . Pneumonia due to Streptococcus pneumoniae ([place])   . Fever   . Community acquired pneumonia of right lung   . Palliative care by specialist   . Goals of care, counseling/discussion   . Pressure injury of skin 07/03/2016  . COPD exacerbation ([place]) 07/21/2015  .  Acute renal insufficiency 07/21/2015  . Hyperglycemia 07/21/2015  . Benign essential HTN 07/21/2015  . S/P shoulder replacement 07/02/2013   Pura Spice, PT, DPT # [id number] 10/14/2019, 7:20 AM  [place] [facility] [address] [place], [name], [address] Phone: [phone]   Fax:  [phone]  Name: Hannah Powell MRN: [id number] Date of Birth: [birth year]

## 2019-10-15 ENCOUNTER — Ambulatory Visit: Payer: Medicare HMO

## 2019-10-15 ENCOUNTER — Other Ambulatory Visit: Payer: Self-pay

## 2019-10-15 ENCOUNTER — Encounter: Payer: Self-pay | Admitting: Physical Therapy

## 2019-10-15 DIAGNOSIS — R293 Abnormal posture: Secondary | ICD-10-CM | POA: Diagnosis not present

## 2019-10-15 DIAGNOSIS — M6281 Muscle weakness (generalized): Secondary | ICD-10-CM

## 2019-10-15 DIAGNOSIS — R2689 Other abnormalities of gait and mobility: Secondary | ICD-10-CM

## 2019-10-15 DIAGNOSIS — R269 Unspecified abnormalities of gait and mobility: Secondary | ICD-10-CM | POA: Diagnosis not present

## 2019-10-15 NOTE — Therapy (Signed)
[facility] [address] [place], [name], [address] Phone: [phone]   Fax:  [phone]  Physical Therapy Treatment  Patient Details  Name: Hannah Powell MRN: [id number] Date of Birth: [birth year] Referring Provider (PT): Dr. Ramonita Lab III   Encounter Date: 10/15/2019   PT End of Session - 10/15/19 1519    Visit Number 4    Number of Visits 9    Date for PT Re-Evaluation 11/03/19    Authorization - Visit Number 4    Authorization - Number of Visits 10    PT Start Time 1027    PT Stop Time 1600    PT Time Calculation (min) 45 min    Activity Tolerance Patient tolerated treatment well;Patient limited by fatigue    Behavior During Therapy [facility] for tasks assessed/performed           Past Medical History:  Diagnosis Date  . Anxiety   . Arthritis   . CHF (congestive heart failure) ([place])   . COPD (chronic obstructive pulmonary disease) ([place])   . Edema    feet/legs  . Hypertension    dr Otho Najjar     . Hypothyroidism   . Leg weakness, bilateral   . Osteoporosis   . Oxygen deficit    2l with bipap at night  . RLS (restless legs syndrome)   . Shortness of breath   . Sleep apnea    bipap  . Wheezing     Past Surgical History:  Procedure Laterality Date  . BACK SURGERY     cervical  . BREAST BIOPSY Left    needle bx-neg  . CATARACT EXTRACTION W/PHACO Left 05/07/2017   Procedure: CATARACT EXTRACTION PHACO AND INTRAOCULAR LENS PLACEMENT (IOC);  Surgeon: Birder Robson, MD;  Location: [facility];  Service: Ophthalmology;  Laterality: Left;  [place] 00:48.7 AP% 11.3 CDE 5.46 Fluid Pack Lot # [id number]  . CATARACT EXTRACTION W/PHACO Right 04/23/2017   Procedure: CATARACT EXTRACTION PHACO AND INTRAOCULAR LENS PLACEMENT ([place]);  Surgeon: Birder Robson, MD;  Location: [facility];  Service: Ophthalmology;  Laterality: Right;  [place] 00:29 AP% 12.8 CDE 3.79 FLUID PACK LOT # [id number]  . FOOT ARTHROPLASTY    . JOINT REPLACEMENT      x2 tkr  . TOTAL SHOULDER ARTHROPLASTY Left 07/02/2013   Procedure: LEFT TOTAL SHOULDER ARTHROPLASTY;  Surgeon: Marin Shutter, MD;  Location: [place];  Service: Orthopedics;  Laterality: Left;  . TOTAL SHOULDER ARTHROPLASTY Right 04/10/2018   Procedure: TOTAL REVERSE SHOULDER ARTHROPLASTY;  Surgeon: Justice Britain, MD;  [facility];  Service: Orthopedics;  Laterality: Right;  130min    There were no vitals filed for this visit.   Subjective Assessment - 10/15/19 1517    Subjective Pt states she has not fallen since last session and she feels like her balance is pretty good.  She worked out with a Art therapist yesterday and had a good workout.    Pertinent History extensive PMH including intensive care unit stays, rehab facility stays, previously ambulated with RW.. COPD, SOB, HTN. Denies CHF, reported she has been cleared by a cardiologist.    Limitations Standing;Walking;House hold activities    Patient Stated Goals improve balance, be able to get up from floor, use SPC    Pain Score 0-No pain            Treatment Today: Therapeutic Ex: Nustep x 10 min level 4 for endurance (cues for achieving half mile) pt able to  achieve this, pt hx obtained during this and discussed HEP/gym program Step ups onto 3 inch x5 ea Step ups onto 6 inch x10 ea Bridges x10, x5 SLR x10 R, x10 L (with PT assist on last 3) Supine pec stretch x5   Neuro re-ed: in // bars (15 min) -Airex activities: step up onto airex static balance, standing weight shifts R/L x10 ea, front/back x10 ea, standing forward reaches x10, lateral reaches x10 ea side, step off airex, marches on airex x10 ea (min UE support) -Side stepping in // bars x3 laps          30 min 1:1 billable tx time today                     PT Long Term Goals - 10/06/19 1642      PT LONG TERM GOAL #1   Title Pt. will improve 5XSTS to <15 sec to improve functional mobility.    Baseline initial: 20.17s     Time 4    Period Weeks    Status New    Target Date 11/03/19      PT LONG TERM GOAL #2   Title Pt. improve Berg balance score to >45/56 to be safe ambulating with SPC to improve independence.    Baseline Initial: 28/56    Time 4    Period Weeks    Status New    Target Date 11/03/19      PT LONG TERM GOAL #3   Title Pt. will demonstrate improved normal gait speed to 0.4 m/s to decrease falls risk and improve ability to safely walk in community.    Baseline initial: 0.257m/s    Time 4    Period Weeks    Status New    Target Date 11/03/19                 Plan - 10/15/19 1607    Clinical Impression Statement Pt had difficulty with side stepping in parallel bars due to hip mm weakness.  No falls or major losses of balance occurred on airex today; however pt is apprehensive to weight shift her pelvis/trunk outside a comfortable stable BOS. Overall she was challenged appropriately with tx session and would benefit from continued skilled PT to address balance, strength deficits to reduce fall risk and improve safety with amb.    Personal Factors and Comorbidities Age;Comorbidity 3+    Comorbidities CPD, HTN, Obesity    Stability/Clinical Decision Making Evolving/Moderate complexity    Rehab Potential Good    PT Frequency 2x / week    PT Duration 4 weeks    PT Treatment/Interventions Cryotherapy;Electrical Stimulation;Ultrasound;Moist Heat;Gait training;Functional mobility training;Therapeutic activities;Therapeutic exercise;Neuromuscular re-education;Scar mobilization;Balance training;Stair training;Patient/family education;Manual techniques    PT Next Visit Plan Progress strengthening/ balance with least assistive device.    Consulted and Agree with Plan of Care Patient           Patient will benefit from skilled therapeutic intervention in order to improve the following deficits and impairments:  Decreased endurance, Decreased range of motion, Decreased skin integrity,  Decreased strength, Hypomobility, Impaired UE functional use, Increased fascial restricitons, Pain, Postural dysfunction, Impaired flexibility, Decreased scar mobility, Decreased mobility, Decreased balance, Improper body mechanics, Abnormal gait, Decreased activity tolerance, Impaired sensation  Visit Diagnosis: Imbalance  Muscle weakness (generalized)  Gait difficulty     Problem List Patient Active Problem List   Diagnosis Date Noted  . S/P reverse total shoulder arthroplasty, 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  . Chronic  diastolic heart failure ([place]) 08/09/2016  . COPD (chronic obstructive pulmonary disease) ([place]) 08/09/2016  . Pulmonary infiltrates   . Pneumonia due to Streptococcus pneumoniae ([place])   . Fever   . Community acquired pneumonia of right lung   . Palliative care by specialist   . Goals of care, counseling/discussion   . Pressure injury of skin 07/03/2016  . COPD exacerbation ([place]) 07/21/2015  . Acute renal insufficiency 07/21/2015  . Hyperglycemia 07/21/2015  . Benign essential HTN 07/21/2015  . S/P shoulder replacement 07/02/2013    Pincus Badder 10/15/2019, 6:30 PM Merdis Delay, PT, DPT   [facility] [address] [place], [name], [address] Phone: [phone]   Fax:  [phone]  Name: Hannah Powell MRN: [id number] Date of Birth: 1945-09-13

## 2019-10-20 ENCOUNTER — Ambulatory Visit: Payer: Medicare HMO | Admitting: Physical Therapy

## 2019-10-21 ENCOUNTER — Ambulatory Visit: Payer: Medicare HMO | Attending: Internal Medicine | Admitting: Physical Therapy

## 2019-10-21 ENCOUNTER — Encounter: Payer: Self-pay | Admitting: Physical Therapy

## 2019-10-21 ENCOUNTER — Other Ambulatory Visit: Payer: Self-pay

## 2019-10-21 DIAGNOSIS — R269 Unspecified abnormalities of gait and mobility: Secondary | ICD-10-CM | POA: Diagnosis not present

## 2019-10-21 DIAGNOSIS — R2689 Other abnormalities of gait and mobility: Secondary | ICD-10-CM | POA: Insufficient documentation

## 2019-10-21 DIAGNOSIS — M6281 Muscle weakness (generalized): Secondary | ICD-10-CM | POA: Diagnosis not present

## 2019-10-21 DIAGNOSIS — R293 Abnormal posture: Secondary | ICD-10-CM

## 2019-10-21 NOTE — Therapy (Signed)
[facility] [address] [place], [name], [address] Phone: [phone]   Fax:  [phone]  Physical Therapy Treatment  Patient Details  Name: Hannah Powell MRN: [id number] Date of Birth: 19-Nov-1945 Referring Provider (PT): Dr. Ramonita Lab III   Encounter Date: 10/21/2019   PT End of Session - 10/21/19 1046    Visit Number 5    Number of Visits 9    Date for PT Re-Evaluation 11/03/19    Authorization - Visit Number 5    Authorization - Number of Visits 10    PT Start Time 7169    PT Stop Time 1116    PT Time Calculation (min) 46 min    Activity Tolerance Patient tolerated treatment well;Patient limited by fatigue    Behavior During Therapy [facility] for tasks assessed/performed           Past Medical History:  Diagnosis Date  . Anxiety   . Arthritis   . CHF (congestive heart failure) ([place])   . COPD (chronic obstructive pulmonary disease) ([place])   . Edema    feet/legs  . Hypertension    dr Otho Najjar     . Hypothyroidism   . Leg weakness, bilateral   . Osteoporosis   . Oxygen deficit    2l with bipap at night  . RLS (restless legs syndrome)   . Shortness of breath   . Sleep apnea    bipap  . Wheezing     Past Surgical History:  Procedure Laterality Date  . BACK SURGERY     cervical  . BREAST BIOPSY Left    needle bx-neg  . CATARACT EXTRACTION W/PHACO Left 05/07/2017   Procedure: CATARACT EXTRACTION PHACO AND INTRAOCULAR LENS PLACEMENT (IOC);  Surgeon: Birder Robson, MD;  Location: [facility];  Service: Ophthalmology;  Laterality: Left;  [place] 00:48.7 AP% 11.3 CDE 5.46 Fluid Pack Lot # [id number]  . CATARACT EXTRACTION W/PHACO Right 04/23/2017   Procedure: CATARACT EXTRACTION PHACO AND INTRAOCULAR LENS PLACEMENT ([place]);  Surgeon: Birder Robson, MD;  Location: [facility];  Service: Ophthalmology;  Laterality: Right;  [place] 00:29 AP% 12.8 CDE 3.79 FLUID PACK LOT # [id number]  . FOOT ARTHROPLASTY    . JOINT REPLACEMENT      x2 tkr  . TOTAL SHOULDER ARTHROPLASTY Left 07/02/2013   Procedure: LEFT TOTAL SHOULDER ARTHROPLASTY;  Surgeon: Marin Shutter, MD;  Location: [place];  Service: Orthopedics;  Laterality: Left;  . TOTAL SHOULDER ARTHROPLASTY Right 04/10/2018   Procedure: TOTAL REVERSE SHOULDER ARTHROPLASTY;  Surgeon: Justice Britain, MD;  [facility];  Service: Orthopedics;  Laterality: Right;  120min    There were no vitals filed for this visit.   Subjective Assessment - 10/21/19 1045    Subjective Pt. states she had a very busy Sunday between a family reunion and visiting a family member in the hospital. She is pausing workouts with Pilar Plate until therapy is completed.    Pertinent History extensive PMH including intensive care unit stays, rehab facility stays, previously ambulated with RW.. COPD, SOB, HTN. Denies CHF, reported she has been cleared by a cardiologist.    Limitations Standing;Walking;House hold activities    Patient Stated Goals improve balance, be able to get up from floor, use SPC    Currently in Pain? No/denies           There Ex:  Nustep 12 mins L4  Supine on mat:   Deadbugs: 10x  Bridges from red bolster: 15x   Hip  flexion from red bolster: 15x each leg   Knee ext with 3lb ankle weights from red bolster: 15x each leg  Combined hip ext with knee ext with 3lb ankle weights: 15x each leg      PT Long Term Goals - 10/06/19 1642      PT LONG TERM GOAL #1   Title Pt. will improve 5XSTS to <15 sec to improve functional mobility.    Baseline initial: 20.17s    Time 4    Period Weeks    Status New    Target Date 11/03/19      PT LONG TERM GOAL #2   Title Pt. improve Berg balance score to >45/56 to be safe ambulating with SPC to improve independence.    Baseline Initial: 28/56    Time 4    Period Weeks    Status New    Target Date 11/03/19      PT LONG TERM GOAL #3   Title Pt. will demonstrate improved normal gait speed to 0.4 m/s to decrease falls risk and improve  ability to safely walk in community.    Baseline initial: 0.284m/s    Time 4    Period Weeks    Status New    Target Date 11/03/19                 Plan - 10/21/19 1148    Clinical Impression Statement Pt. reports feeling very tired today due to busy weekend. Therapy today focused on supine bilat LE strengthening with proper form. Pt. ambulated with improved gait quality when leaving therapy today with more upright posture in walker. Pt. will continue to benefit from skilled PT to improve strenght deficits and improve balance to decrease falls risk.    Personal Factors and Comorbidities Age;Comorbidity 3+    Comorbidities CPD, HTN, Obesity    Stability/Clinical Decision Making Evolving/Moderate complexity    Clinical Decision Making Moderate    Rehab Potential Good    PT Frequency 2x / week    PT Duration 4 weeks    PT Treatment/Interventions Cryotherapy;Electrical Stimulation;Ultrasound;Moist Heat;Gait training;Functional mobility training;Therapeutic activities;Therapeutic exercise;Neuromuscular re-education;Scar mobilization;Balance training;Stair training;Patient/family education;Manual techniques    PT Next Visit Plan Progress strengthening/ balance with least assistive device.    Consulted and Agree with Plan of Care Patient           Patient will benefit from skilled therapeutic intervention in order to improve the following deficits and impairments:  Decreased endurance, Decreased range of motion, Decreased skin integrity, Decreased strength, Hypomobility, Impaired UE functional use, Increased fascial restricitons, Pain, Postural dysfunction, Impaired flexibility, Decreased scar mobility, Decreased mobility, Decreased balance, Improper body mechanics, Abnormal gait, Decreased activity tolerance, Impaired sensation  Visit Diagnosis: Imbalance  Muscle weakness (generalized)  Abnormal posture  Gait difficulty     Problem List Patient Active Problem List    Diagnosis Date Noted  . S/P reverse total shoulder arthroplasty, 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  . Chronic diastolic heart failure ([place]) 08/09/2016  . COPD (chronic obstructive pulmonary disease) ([place]) 08/09/2016  . Pulmonary infiltrates   . Pneumonia due to Streptococcus pneumoniae ([place])   . Fever   . Community acquired pneumonia of right lung   . Palliative care by specialist   . Goals of care, counseling/discussion   . Pressure injury of skin 07/03/2016  . COPD exacerbation ([place]) 07/21/2015  . Acute renal insufficiency 07/21/2015  . Hyperglycemia 07/21/2015  . Benign essential HTN 07/21/2015  . S/P shoulder replacement 07/02/2013   Pura Spice,  PT, DPT # [id number] Carlyle Basques, SPT 10/22/2019, 8:42 AM  [facility] [address] [place], [name], [address] Phone: [phone]   Fax:  [phone]  Name: Mikalyn Hermida MRN: [id number] Date of Birth: [birth year]

## 2019-10-22 ENCOUNTER — Ambulatory Visit: Payer: Medicare HMO | Admitting: Physical Therapy

## 2019-10-26 DIAGNOSIS — J9610 Chronic respiratory failure, unspecified whether with hypoxia or hypercapnia: Secondary | ICD-10-CM | POA: Diagnosis not present

## 2019-10-26 DIAGNOSIS — J441 Chronic obstructive pulmonary disease with (acute) exacerbation: Secondary | ICD-10-CM | POA: Diagnosis not present

## 2019-10-26 DIAGNOSIS — J449 Chronic obstructive pulmonary disease, unspecified: Secondary | ICD-10-CM | POA: Diagnosis not present

## 2019-10-27 ENCOUNTER — Encounter: Payer: Self-pay | Admitting: Physical Therapy

## 2019-10-27 ENCOUNTER — Other Ambulatory Visit: Payer: Self-pay

## 2019-10-27 ENCOUNTER — Ambulatory Visit: Payer: Medicare HMO | Admitting: Physical Therapy

## 2019-10-27 DIAGNOSIS — R293 Abnormal posture: Secondary | ICD-10-CM | POA: Diagnosis not present

## 2019-10-27 DIAGNOSIS — R269 Unspecified abnormalities of gait and mobility: Secondary | ICD-10-CM

## 2019-10-27 DIAGNOSIS — R2689 Other abnormalities of gait and mobility: Secondary | ICD-10-CM

## 2019-10-27 DIAGNOSIS — M6281 Muscle weakness (generalized): Secondary | ICD-10-CM | POA: Diagnosis not present

## 2019-10-27 NOTE — Therapy (Signed)
[facility] [address] [place], [name], [address] Phone: [phone]   Fax:  [phone]  Physical Therapy Treatment  Patient Details  Name: Hannah Powell MRN: [id number] Date of Birth: 1945/12/21 Referring Provider (PT): Dr. Ramonita Lab III   Encounter Date: 10/27/2019   PT End of Session - 10/27/19 1532    Visit Number 6    Number of Visits 9    Date for PT Re-Evaluation 11/03/19    Authorization - Visit Number 6    Authorization - Number of Visits 10    PT Start Time 3664    PT Stop Time 1600    PT Time Calculation (min) 39 min    Activity Tolerance Patient tolerated treatment well;Patient limited by fatigue    Behavior During Therapy [facility] for tasks assessed/performed           Past Medical History:  Diagnosis Date  . Anxiety   . Arthritis   . CHF (congestive heart failure) ([place])   . COPD (chronic obstructive pulmonary disease) ([place])   . Edema    feet/legs  . Hypertension    dr Otho Najjar     . Hypothyroidism   . Leg weakness, bilateral   . Osteoporosis   . Oxygen deficit    2l with bipap at night  . RLS (restless legs syndrome)   . Shortness of breath   . Sleep apnea    bipap  . Wheezing     Past Surgical History:  Procedure Laterality Date  . BACK SURGERY     cervical  . BREAST BIOPSY Left    needle bx-neg  . CATARACT EXTRACTION W/PHACO Left 05/07/2017   Procedure: CATARACT EXTRACTION PHACO AND INTRAOCULAR LENS PLACEMENT (IOC);  Surgeon: Birder Robson, MD;  Location: [facility];  Service: Ophthalmology;  Laterality: Left;  [place] 00:48.7 AP% 11.3 CDE 5.46 Fluid Pack Lot # [id number]  . CATARACT EXTRACTION W/PHACO Right 04/23/2017   Procedure: CATARACT EXTRACTION PHACO AND INTRAOCULAR LENS PLACEMENT ([place]);  Surgeon: Birder Robson, MD;  Location: [facility];  Service: Ophthalmology;  Laterality: Right;  [place] 00:29 AP% 12.8 CDE 3.79 FLUID PACK LOT # [id number]  . FOOT ARTHROPLASTY    . JOINT REPLACEMENT      x2 tkr  . TOTAL SHOULDER ARTHROPLASTY Left 07/02/2013   Procedure: LEFT TOTAL SHOULDER ARTHROPLASTY;  Surgeon: Marin Shutter, MD;  Location: [place];  Service: Orthopedics;  Laterality: Left;  . TOTAL SHOULDER ARTHROPLASTY Right 04/10/2018   Procedure: TOTAL REVERSE SHOULDER ARTHROPLASTY;  Surgeon: Justice Britain, MD;  [facility];  Service: Orthopedics;  Laterality: Right;  126min    There were no vitals filed for this visit.   Subjective Assessment - 10/27/19 1531    Subjective Pt states that she had a very good workout yesterday focusing on UE.    Pertinent History extensive PMH including intensive care unit stays, rehab facility stays, previously ambulated with RW.. COPD, SOB, HTN. Denies CHF, reported she has been cleared by a cardiologist.    Limitations Standing;Walking;House hold activities    Patient Stated Goals improve balance, be able to get up from floor, use SPC    Currently in Pain? No/denies           There Ex:  Nustep 12 mins L4   STS from chair with airex: 5x reps of 2 rapid stands. Pt able to better complete with cueing to lean forward and bring feet underneath her. At end of session, pt able  to complete 3 rapid stands.  Gait training:   Walking in hallway with rolling walker: pt instructed to walk with increased gait speed and increase right glute contraction with R stance phase to decrease hip drop. Pt cued to increase upright posture with walker and decrease upper extremity usage.        PT Long Term Goals - 10/06/19 1642      PT LONG TERM GOAL #1   Title Pt. will improve 5XSTS to <15 sec to improve functional mobility.    Baseline initial: 20.17s    Time 4    Period Weeks    Status New    Target Date 11/03/19      PT LONG TERM GOAL #2   Title Pt. improve Berg balance score to >45/56 to be safe ambulating with SPC to improve independence.    Baseline Initial: 28/56    Time 4    Period Weeks    Status New    Target Date 11/03/19      PT  LONG TERM GOAL #3   Title Pt. will demonstrate improved normal gait speed to 0.4 m/s to decrease falls risk and improve ability to safely walk in community.    Baseline initial: 0.251m/s    Time 4    Period Weeks    Status New    Target Date 11/03/19                 Plan - 10/27/19 1601    Clinical Impression Statement Pt. focused today on increasing gait speed and improving STS technique. Pt. improved ability to complete STS with improving forward lean before coming to standing and bringing feet underneath her. Pt. demonstrated increased gait speed with walking when cued to increase speed. Pt. will continue to benefit from skilled PT to improve strength and balance to decreaes falls risk.    Personal Factors and Comorbidities Age;Comorbidity 3+    Comorbidities CPD, HTN, Obesity    Stability/Clinical Decision Making Evolving/Moderate complexity    Clinical Decision Making Moderate    Rehab Potential Good    PT Frequency 2x / week    PT Duration 4 weeks    PT Treatment/Interventions Cryotherapy;Electrical Stimulation;Ultrasound;Moist Heat;Gait training;Functional mobility training;Therapeutic activities;Therapeutic exercise;Neuromuscular re-education;Scar mobilization;Balance training;Stair training;Patient/family education;Manual techniques    PT Next Visit Plan Progress strengthening/ balance with least assistive device.    Consulted and Agree with Plan of Care Patient           Patient will benefit from skilled therapeutic intervention in order to improve the following deficits and impairments:  Decreased endurance, Decreased range of motion, Decreased skin integrity, Decreased strength, Hypomobility, Impaired UE functional use, Increased fascial restricitons, Pain, Postural dysfunction, Impaired flexibility, Decreased scar mobility, Decreased mobility, Decreased balance, Improper body mechanics, Abnormal gait, Decreased activity tolerance, Impaired sensation  Visit  Diagnosis: Imbalance  Muscle weakness (generalized)  Abnormal posture  Gait difficulty     Problem List Patient Active Problem List   Diagnosis Date Noted  . S/P reverse total shoulder arthroplasty, 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  . Chronic diastolic heart failure ([place]) 08/09/2016  . COPD (chronic obstructive pulmonary disease) ([place]) 08/09/2016  . Pulmonary infiltrates   . Pneumonia due to Streptococcus pneumoniae ([place])   . Fever   . Community acquired pneumonia of right lung   . Palliative care by specialist   . Goals of care, counseling/discussion   . Pressure injury of skin 07/03/2016  . COPD exacerbation ([place]) 07/21/2015  . Acute renal insufficiency 07/21/2015  .  Hyperglycemia 07/21/2015  . Benign essential HTN 07/21/2015  . S/P shoulder replacement 07/02/2013   Pura Spice, PT, DPT # [id number] Carlyle Basques, SPT 10/28/2019, 6:23 PM  [facility] [address] [place], [name], [address] Phone: [phone]   Fax:  [phone]  Name: Hannah Powell MRN: [id number] Date of Birth: [birth year]

## 2019-10-29 ENCOUNTER — Other Ambulatory Visit: Payer: Self-pay

## 2019-10-29 ENCOUNTER — Ambulatory Visit: Payer: Medicare HMO | Admitting: Physical Therapy

## 2019-10-29 ENCOUNTER — Encounter: Payer: Self-pay | Admitting: Physical Therapy

## 2019-10-29 DIAGNOSIS — R293 Abnormal posture: Secondary | ICD-10-CM

## 2019-10-29 DIAGNOSIS — R269 Unspecified abnormalities of gait and mobility: Secondary | ICD-10-CM

## 2019-10-29 DIAGNOSIS — R2689 Other abnormalities of gait and mobility: Secondary | ICD-10-CM

## 2019-10-29 DIAGNOSIS — M6281 Muscle weakness (generalized): Secondary | ICD-10-CM

## 2019-10-29 NOTE — Therapy (Signed)
[facility] [address] [place], [name], [address] Phone: [phone]   Fax:  [phone]  Physical Therapy Treatment  Patient Details  Name: Hannah Powell MRN: [id number] Date of Birth: 07-14-1945 Referring Provider (PT): Dr. Ramonita Lab III   Encounter Date: 10/29/2019   PT End of Session - 10/29/19 1509    Visit Number 7    Number of Visits 9    Date for PT Re-Evaluation 11/03/19    Authorization - Visit Number 7    Authorization - Number of Visits 10    PT Start Time 1505    PT Stop Time 1550    PT Time Calculation (min) 45 min    Activity Tolerance Patient tolerated treatment well;Patient limited by fatigue    Behavior During Therapy [facility] for tasks assessed/performed           Past Medical History:  Diagnosis Date  . Anxiety   . Arthritis   . CHF (congestive heart failure) ([place])   . COPD (chronic obstructive pulmonary disease) ([place])   . Edema    feet/legs  . Hypertension    dr Otho Najjar     . Hypothyroidism   . Leg weakness, bilateral   . Osteoporosis   . Oxygen deficit    2l with bipap at night  . RLS (restless legs syndrome)   . Shortness of breath   . Sleep apnea    bipap  . Wheezing     Past Surgical History:  Procedure Laterality Date  . BACK SURGERY     cervical  . BREAST BIOPSY Left    needle bx-neg  . CATARACT EXTRACTION W/PHACO Left 05/07/2017   Procedure: CATARACT EXTRACTION PHACO AND INTRAOCULAR LENS PLACEMENT (IOC);  Surgeon: Birder Robson, MD;  Location: [facility];  Service: Ophthalmology;  Laterality: Left;  [place] 00:48.7 AP% 11.3 CDE 5.46 Fluid Pack Lot # [id number]  . CATARACT EXTRACTION W/PHACO Right 04/23/2017   Procedure: CATARACT EXTRACTION PHACO AND INTRAOCULAR LENS PLACEMENT ([place]);  Surgeon: Birder Robson, MD;  Location: [facility];  Service: Ophthalmology;  Laterality: Right;  [place] 00:29 AP% 12.8 CDE 3.79 FLUID PACK LOT # [id number]  . FOOT ARTHROPLASTY    . JOINT REPLACEMENT      x2 tkr  . TOTAL SHOULDER ARTHROPLASTY Left 07/02/2013   Procedure: LEFT TOTAL SHOULDER ARTHROPLASTY;  Surgeon: Marin Shutter, MD;  Location: [place];  Service: Orthopedics;  Laterality: Left;  . TOTAL SHOULDER ARTHROPLASTY Right 04/10/2018   Procedure: TOTAL REVERSE SHOULDER ARTHROPLASTY;  Surgeon: Justice Britain, MD;  [facility];  Service: Orthopedics;  Laterality: Right;  112min    There were no vitals filed for this visit.   Subjective Assessment - 10/29/19 1507    Subjective Pt. states that she has been feeling good, no falls reported. No pain today, feels slightly tired.    Pertinent History extensive PMH including intensive care unit stays, rehab facility stays, previously ambulated with RW.. COPD, SOB, HTN. Denies CHF, reported she has been cleared by a cardiologist.    Limitations Standing;Walking;House hold activities    Patient Stated Goals improve balance, be able to get up from floor, use SPC    Currently in Pain? No/denies              There Ex:  Nustep L5 12 mins   STS practice: 3x 2 rapid stands to improve power production of LEs. Pt cued on increasing lean before standing  Fast walking around gym to  improve LE strength: 3x around gym  Supine marches: 2x10 reps each leg  Supine double leg lift off red bolster: 2x5 reps  Supine bridges from red bolster: 2x10 reps  Supine knee ext off red bolster: 2x10 reps       PT Long Term Goals - 10/06/19 1642      PT LONG TERM GOAL #1   Title Pt. will improve 5XSTS to <15 sec to improve functional mobility.    Baseline initial: 20.17s    Time 4    Period Weeks    Status New    Target Date 11/03/19      PT LONG TERM GOAL #2   Title Pt. improve Berg balance score to >45/56 to be safe ambulating with SPC to improve independence.    Baseline Initial: 28/56    Time 4    Period Weeks    Status New    Target Date 11/03/19      PT LONG TERM GOAL #3   Title Pt. will demonstrate improved normal gait speed  to 0.4 m/s to decrease falls risk and improve ability to safely walk in community.    Baseline initial: 0.230m/s    Time 4    Period Weeks    Status New    Target Date 11/03/19                 Plan - 10/29/19 1600    Clinical Impression Statement Pt. continues to demonstrate good return on STS technique and speed of completion in rapid reps. Pt. focused on LE strength in supine, demonstrates improved ability to complete bridges from red bolster. Pt. will continue to benefit from skilled PT to improve strength and balance to decrease falls risk.    Personal Factors and Comorbidities Age;Comorbidity 3+    Comorbidities CPD, HTN, Obesity    Stability/Clinical Decision Making Evolving/Moderate complexity    Clinical Decision Making Moderate    Rehab Potential Good    PT Frequency 2x / week    PT Duration 4 weeks    PT Treatment/Interventions Cryotherapy;Electrical Stimulation;Ultrasound;Moist Heat;Gait training;Functional mobility training;Therapeutic activities;Therapeutic exercise;Neuromuscular re-education;Scar mobilization;Balance training;Stair training;Patient/family education;Manual techniques    PT Next Visit Plan Progress strengthening/ balance with least assistive device.    Consulted and Agree with Plan of Care Patient           Patient will benefit from skilled therapeutic intervention in order to improve the following deficits and impairments:  Decreased endurance, Decreased range of motion, Decreased skin integrity, Decreased strength, Hypomobility, Impaired UE functional use, Increased fascial restricitons, Pain, Postural dysfunction, Impaired flexibility, Decreased scar mobility, Decreased mobility, Decreased balance, Improper body mechanics, Abnormal gait, Decreased activity tolerance, Impaired sensation  Visit Diagnosis: Imbalance  Muscle weakness (generalized)  Abnormal posture  Gait difficulty     Problem List Patient Active Problem List   Diagnosis  Date Noted  . S/P reverse total shoulder arthroplasty, 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  . Chronic diastolic heart failure ([place]) 08/09/2016  . COPD (chronic obstructive pulmonary disease) ([place]) 08/09/2016  . Pulmonary infiltrates   . Pneumonia due to Streptococcus pneumoniae ([place])   . Fever   . Community acquired pneumonia of right lung   . Palliative care by specialist   . Goals of care, counseling/discussion   . Pressure injury of skin 07/03/2016  . COPD exacerbation ([place]) 07/21/2015  . Acute renal insufficiency 07/21/2015  . Hyperglycemia 07/21/2015  . Benign essential HTN 07/21/2015  . S/P shoulder replacement 07/02/2013   Pura Spice, PT,  DPT # [id number], SPT 10/29/2019, 5:52 PM  [facility] [address] [place], [name], [address] Phone: [phone]   Fax:  [phone]  Name: Keyondra Lagrand MRN: [id number] Date of Birth: 09-Mar-1946

## 2019-11-02 DIAGNOSIS — I509 Heart failure, unspecified: Secondary | ICD-10-CM | POA: Diagnosis not present

## 2019-11-02 DIAGNOSIS — G63 Polyneuropathy in diseases classified elsewhere: Secondary | ICD-10-CM | POA: Diagnosis not present

## 2019-11-02 DIAGNOSIS — E039 Hypothyroidism, unspecified: Secondary | ICD-10-CM | POA: Diagnosis not present

## 2019-11-02 DIAGNOSIS — I130 Hypertensive heart and chronic kidney disease with heart failure and stage 1 through stage 4 chronic kidney disease, or unspecified chronic kidney disease: Secondary | ICD-10-CM | POA: Diagnosis not present

## 2019-11-02 DIAGNOSIS — E785 Hyperlipidemia, unspecified: Secondary | ICD-10-CM | POA: Diagnosis not present

## 2019-11-02 DIAGNOSIS — N183 Chronic kidney disease, stage 3 unspecified: Secondary | ICD-10-CM | POA: Diagnosis not present

## 2019-11-02 DIAGNOSIS — J449 Chronic obstructive pulmonary disease, unspecified: Secondary | ICD-10-CM | POA: Diagnosis not present

## 2019-11-02 DIAGNOSIS — J9611 Chronic respiratory failure with hypoxia: Secondary | ICD-10-CM | POA: Diagnosis not present

## 2019-11-03 ENCOUNTER — Ambulatory Visit: Payer: Medicare HMO | Admitting: Physical Therapy

## 2019-11-03 ENCOUNTER — Other Ambulatory Visit: Payer: Self-pay

## 2019-11-03 ENCOUNTER — Encounter: Payer: Self-pay | Admitting: Physical Therapy

## 2019-11-03 DIAGNOSIS — R269 Unspecified abnormalities of gait and mobility: Secondary | ICD-10-CM | POA: Diagnosis not present

## 2019-11-03 DIAGNOSIS — M6281 Muscle weakness (generalized): Secondary | ICD-10-CM

## 2019-11-03 DIAGNOSIS — R293 Abnormal posture: Secondary | ICD-10-CM | POA: Diagnosis not present

## 2019-11-03 DIAGNOSIS — R2689 Other abnormalities of gait and mobility: Secondary | ICD-10-CM | POA: Diagnosis not present

## 2019-11-03 NOTE — Therapy (Signed)
[facility] [address] [place], [name], [address] Phone: [phone]   Fax:  [phone]  Physical Therapy Treatment  Patient Details  Name: Hannah Powell MRN: [id number] Date of Birth: 1946/02/21 Referring Provider (PT): Dr. Ramonita Lab III   Encounter Date: 11/03/2019   PT End of Session - 11/03/19 1616    Visit Number 8    Number of Visits 9    Date for PT Re-Evaluation 11/03/19    Authorization - Visit Number 8    Authorization - Number of Visits 10    PT Start Time 0306    PT Stop Time 0354    PT Time Calculation (min) 48 min    Activity Tolerance Patient tolerated treatment well;Patient limited by fatigue    Behavior During Therapy [facility] for tasks assessed/performed           Past Medical History:  Diagnosis Date  . Anxiety   . Arthritis   . CHF (congestive heart failure) ([place])   . COPD (chronic obstructive pulmonary disease) ([place])   . Edema    feet/legs  . Hypertension    dr Otho Najjar     . Hypothyroidism   . Leg weakness, bilateral   . Osteoporosis   . Oxygen deficit    2l with bipap at night  . RLS (restless legs syndrome)   . Shortness of breath   . Sleep apnea    bipap  . Wheezing     Past Surgical History:  Procedure Laterality Date  . BACK SURGERY     cervical  . BREAST BIOPSY Left    needle bx-neg  . CATARACT EXTRACTION W/PHACO Left 05/07/2017   Procedure: CATARACT EXTRACTION PHACO AND INTRAOCULAR LENS PLACEMENT (IOC);  Surgeon: Birder Robson, MD;  Location: [facility];  Service: Ophthalmology;  Laterality: Left;  [place] 00:48.7 AP% 11.3 CDE 5.46 Fluid Pack Lot # [id number]  . CATARACT EXTRACTION W/PHACO Right 04/23/2017   Procedure: CATARACT EXTRACTION PHACO AND INTRAOCULAR LENS PLACEMENT ([place]);  Surgeon: Birder Robson, MD;  Location: [facility];  Service: Ophthalmology;  Laterality: Right;  [place] 00:29 AP% 12.8 CDE 3.79 FLUID PACK LOT # [id number]  . FOOT ARTHROPLASTY    . JOINT REPLACEMENT      x2 tkr  . TOTAL SHOULDER ARTHROPLASTY Left 07/02/2013   Procedure: LEFT TOTAL SHOULDER ARTHROPLASTY;  Surgeon: Marin Shutter, MD;  Location: [place];  Service: Orthopedics;  Laterality: Left;  . TOTAL SHOULDER ARTHROPLASTY Right 04/10/2018   Procedure: TOTAL REVERSE SHOULDER ARTHROPLASTY;  Surgeon: Justice Britain, MD;  [facility];  Service: Orthopedics;  Laterality: Right;  164min    There were no vitals filed for this visit.   Subjective Assessment - 11/03/19 1541    Subjective Pt. states that she went to a birthday party this past weekend. No pain today.    Pertinent History extensive PMH including intensive care unit stays, rehab facility stays, previously ambulated with RW.. COPD, SOB, HTN. Denies CHF, reported she has been cleared by a cardiologist.    Limitations Standing;Walking;House hold activities    Patient Stated Goals improve balance, be able to get up from floor, use SPC    Currently in Pain? No/denies            There Ex:  STS on blue mat table 10x with no UE assist (added standing shoulder flexion).    Seated marching/ LAQ/ alt. UE and LE (20x)- moderate cuing for posture correction/ no back support on blue  mat table  Standing step touches (toe/heel) with RW (attempted L UE only on RW but unable to safely complete step touches).    Nustep 12 mins L4 B UE/LE  Forwards/backwards marches in // bars: 3x  Fast walking in hallway with RW: ~65 feet. Pt able to ambulate with more upright posture and increased gait speed compared to normal.        PT Long Term Goals - 10/06/19 1642      PT LONG TERM GOAL #1   Title Pt. will improve 5XSTS to <15 sec to improve functional mobility.    Baseline initial: 20.17s    Time 4    Period Weeks    Status New    Target Date 11/03/19      PT LONG TERM GOAL #2   Title Pt. improve Berg balance score to >45/56 to be safe ambulating with SPC to improve independence.    Baseline Initial: 28/56    Time 4    Period Weeks     Status New    Target Date 11/03/19      PT LONG TERM GOAL #3   Title Pt. will demonstrate improved normal gait speed to 0.4 m/s to decrease falls risk and improve ability to safely walk in community.    Baseline initial: 0.28m/s    Time 4    Period Weeks    Status New    Target Date 11/03/19                 Plan - 11/03/19 1621    Clinical Impression Statement Pt. demonstrated improved ability to ambulate with more correct posture and decreased L shoulder hike. Pt. continues to require cueing to maintain as upright posture as possible during standing and ambulation. Pt. demonstrates improved balance with walking marches as well as glute med recruitment to decrease hip drop bilaterally. Pt. continues to practice STS technique with improved ability. Pt. will continue to benefit from skilled PT to improve gait quality and decrease risk of falls.    Personal Factors and Comorbidities Age;Comorbidity 3+    Comorbidities CPD, HTN, Obesity    Stability/Clinical Decision Making Evolving/Moderate complexity    Clinical Decision Making Moderate    Rehab Potential Good    PT Frequency 2x / week    PT Duration 4 weeks    PT Treatment/Interventions Cryotherapy;Electrical Stimulation;Ultrasound;Moist Heat;Gait training;Functional mobility training;Therapeutic activities;Therapeutic exercise;Neuromuscular re-education;Scar mobilization;Balance training;Stair training;Patient/family education;Manual techniques    PT Next Visit Plan Progress strengthening/ balance with least assistive device.  CHECK GOALS/ RECERT next tx.    Consulted and Agree with Plan of Care Patient           Patient will benefit from skilled therapeutic intervention in order to improve the following deficits and impairments:  Decreased endurance, Decreased range of motion, Decreased skin integrity, Decreased strength, Hypomobility, Impaired UE functional use, Increased fascial restricitons, Pain, Postural dysfunction,  Impaired flexibility, Decreased scar mobility, Decreased mobility, Decreased balance, Improper body mechanics, Abnormal gait, Decreased activity tolerance, Impaired sensation  Visit Diagnosis: Imbalance  Muscle weakness (generalized)  Abnormal posture  Gait difficulty     Problem List Patient Active Problem List   Diagnosis Date Noted  . S/P reverse total shoulder arthroplasty, 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  . Chronic diastolic heart failure ([place]) 08/09/2016  . COPD (chronic obstructive pulmonary disease) ([place]) 08/09/2016  . Pulmonary infiltrates   . Pneumonia due to Streptococcus pneumoniae ([place])   . Fever   . Community acquired pneumonia of right lung   .  Palliative care by specialist   . Goals of care, counseling/discussion   . Pressure injury of skin 07/03/2016  . COPD exacerbation ([place]) 07/21/2015  . Acute renal insufficiency 07/21/2015  . Hyperglycemia 07/21/2015  . Benign essential HTN 07/21/2015  . S/P shoulder replacement 07/02/2013   Pura Spice, PT, DPT # [id number] Carlyle Basques, SPT 11/03/2019, 6:28 PM  [place] [facility] [address] [place], [name], [address] Phone: [phone]   Fax:  [phone]  Name: Mirian Casco MRN: [id number] Date of Birth: January 24, 1946

## 2019-11-05 ENCOUNTER — Other Ambulatory Visit: Payer: Self-pay

## 2019-11-05 ENCOUNTER — Ambulatory Visit: Payer: Medicare HMO | Admitting: Physical Therapy

## 2019-11-05 ENCOUNTER — Encounter: Payer: Self-pay | Admitting: Physical Therapy

## 2019-11-05 DIAGNOSIS — R293 Abnormal posture: Secondary | ICD-10-CM | POA: Diagnosis not present

## 2019-11-05 DIAGNOSIS — R269 Unspecified abnormalities of gait and mobility: Secondary | ICD-10-CM | POA: Diagnosis not present

## 2019-11-05 DIAGNOSIS — R2689 Other abnormalities of gait and mobility: Secondary | ICD-10-CM

## 2019-11-05 DIAGNOSIS — M6281 Muscle weakness (generalized): Secondary | ICD-10-CM

## 2019-11-05 NOTE — Therapy (Signed)
[facility] [address]. [place], [name], [address] Phone: [phone]   Fax:  [phone]  Physical Therapy Treatment  Patient Details  Name: Hannah Powell MRN: [id number] Date of Birth: November 23, 1945 Referring Provider (PT): Dr. Ramonita Lab III   Encounter Date: 11/05/2019   PT End of Session - 11/05/19 1530    Visit Number 9    Number of Visits 17    Date for PT Re-Evaluation 12/03/19    Authorization - Visit Number 1    Authorization - Number of Visits 10    PT Start Time 1505    PT Stop Time 1553    PT Time Calculation (min) 48 min    Activity Tolerance Patient tolerated treatment well;Patient limited by fatigue    Behavior During Therapy [facility] for tasks assessed/performed           Past Medical History:  Diagnosis Date  . Anxiety   . Arthritis   . CHF (congestive heart failure) ([place])   . COPD (chronic obstructive pulmonary disease) ([place])   . Edema    feet/legs  . Hypertension    dr Otho Najjar     . Hypothyroidism   . Leg weakness, bilateral   . Osteoporosis   . Oxygen deficit    2l with bipap at night  . RLS (restless legs syndrome)   . Shortness of breath   . Sleep apnea    bipap  . Wheezing     Past Surgical History:  Procedure Laterality Date  . BACK SURGERY     cervical  . BREAST BIOPSY Left    needle bx-neg  . CATARACT EXTRACTION W/PHACO Left 05/07/2017   Procedure: CATARACT EXTRACTION PHACO AND INTRAOCULAR LENS PLACEMENT (IOC);  Surgeon: Birder Robson, MD;  Location: [facility];  Service: Ophthalmology;  Laterality: Left;  [place] 00:48.7 AP% 11.3 CDE 5.46 Fluid Pack Lot # [id number]  . CATARACT EXTRACTION W/PHACO Right 04/23/2017   Procedure: CATARACT EXTRACTION PHACO AND INTRAOCULAR LENS PLACEMENT ([place]);  Surgeon: Birder Robson, MD;  Location: [facility];  Service: Ophthalmology;  Laterality: Right;  [place] 00:29 AP% 12.8 CDE 3.79 FLUID PACK LOT # [id number]  . FOOT ARTHROPLASTY    . JOINT  REPLACEMENT     x2 tkr  . TOTAL SHOULDER ARTHROPLASTY Left 07/02/2013   Procedure: LEFT TOTAL SHOULDER ARTHROPLASTY;  Surgeon: Marin Shutter, MD;  Location: [place];  Service: Orthopedics;  Laterality: Left;  . TOTAL SHOULDER ARTHROPLASTY Right 04/10/2018   Procedure: TOTAL REVERSE SHOULDER ARTHROPLASTY;  Surgeon: Justice Britain, MD;  [facility];  Service: Orthopedics;  Laterality: Right;  163min    There were no vitals filed for this visit.   Subjective Assessment - 11/05/19 1528    Subjective Pt states that she is tired and had a lot of errands to run this morning. No pain today.    Pertinent History extensive PMH including intensive care unit stays, rehab facility stays, previously ambulated with RW.. COPD, SOB, HTN. Denies CHF, reported she has been cleared by a cardiologist.    Limitations Standing;Walking;House hold activities    Patient Stated Goals improve balance, be able to get up from floor, use SPC    Currently in Pain? No/denies             [facility] PT Assessment - 11/06/19 0001      Assessment   Medical Diagnosis Spinal stenosis of lumbar region with neurogenic claudication    Referring Provider (PT) Dr. Ramonita Lab  III    Onset Date/Surgical Date 03/20/19      Prior Function   Level of Independence Requires assistive device for independence      Cognition   Overall Cognitive Status Within Functional Limits for tasks assessed           There Ex:  Nustep 10 mins L5  Supine marches: 2x10 reps each leg  Supine double leg lift off red bolster: 2x5 reps  Supine bridges from red bolster: 2x10 reps  Supine knee ext off red bolster: 2x10 reps  Supine serratus punches: 10x each arm  Supine chest press with 1.5 ankle weight tied around nautilus straight bar: 10x  Supine open book thoracic stretches: 2x10 reps each direction.          PT Long Term Goals - 11/05/19 1540      PT LONG TERM GOAL #1   Title Pt. will improve 5XSTS to <15 sec to improve  functional mobility.    Baseline initial: 20.17s; 8/19: will test next session due to fatigue    Time 4    Period Weeks    Status On-going    Target Date 12/03/19      PT LONG TERM GOAL #2   Title Pt. improve Berg balance score to >45/56 to be safe ambulating with SPC to improve independence.    Baseline Initial: 28/56; 8/19: will test next session due to fatigue    Time 4    Period Weeks    Status On-going    Target Date 12/03/19      PT LONG TERM GOAL #3   Title Pt. will demonstrate improved normal gait speed to 0.4 m/s to decrease falls risk and improve ability to safely walk in community.    Baseline initial: 0.283m/s; 8/19: will test next session due to fatigue    Time 4    Period Weeks    Status On-going    Target Date 12/03/19                 Plan - 11/05/19 1559    Clinical Impression Statement Pt. very fatigued for today's session, therefore goals will be reassessed next session. Pt. responded well to supine strengthening LE and UE exercises. Pt. demonstrates improved STS form in therapy. Pt. will continue to benefit from skilled PT to improve strength and improve gait quality to decrease falls risk.    Personal Factors and Comorbidities Age;Comorbidity 3+    Comorbidities CPD, HTN, Obesity    Stability/Clinical Decision Making Evolving/Moderate complexity    Clinical Decision Making Moderate    Rehab Potential Good    PT Frequency 2x / week    PT Duration 4 weeks    PT Treatment/Interventions Cryotherapy;Electrical Stimulation;Ultrasound;Moist Heat;Gait training;Functional mobility training;Therapeutic activities;Therapeutic exercise;Neuromuscular re-education;Scar mobilization;Balance training;Stair training;Patient/family education;Manual techniques    PT Next Visit Plan Progress strengthening/ balance with least assistive device.  CHECK GOALS/ RECERT next tx.    Consulted and Agree with Plan of Care Patient           Patient will benefit from skilled  therapeutic intervention in order to improve the following deficits and impairments:  Decreased endurance, Decreased range of motion, Decreased skin integrity, Decreased strength, Hypomobility, Impaired UE functional use, Increased fascial restricitons, Pain, Postural dysfunction, Impaired flexibility, Decreased scar mobility, Decreased mobility, Decreased balance, Improper body mechanics, Abnormal gait, Decreased activity tolerance, Impaired sensation  Visit Diagnosis: Imbalance  Muscle weakness (generalized)  Abnormal posture  Gait difficulty     Problem List  Patient Active Problem List   Diagnosis Date Noted  . S/P reverse total shoulder arthroplasty, 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  . Chronic diastolic heart failure ([place]) 08/09/2016  . COPD (chronic obstructive pulmonary disease) ([place]) 08/09/2016  . Pulmonary infiltrates   . Pneumonia due to Streptococcus pneumoniae ([place])   . Fever   . Community acquired pneumonia of right lung   . Palliative care by specialist   . Goals of care, counseling/discussion   . Pressure injury of skin 07/03/2016  . COPD exacerbation ([place]) 07/21/2015  . Acute renal insufficiency 07/21/2015  . Hyperglycemia 07/21/2015  . Benign essential HTN 07/21/2015  . S/P shoulder replacement 07/02/2013   Pura Spice, PT, DPT # [id number] Carlyle Basques, SPT 11/06/2019, 10:25 AM  [place] [facility] [address] [place], [name], [address] Phone: [phone]   Fax:  [phone]  Name: Teigen Parslow MRN: [id number] Date of Birth: 1945/08/20

## 2019-11-07 DIAGNOSIS — J9620 Acute and chronic respiratory failure, unspecified whether with hypoxia or hypercapnia: Secondary | ICD-10-CM | POA: Diagnosis not present

## 2019-11-07 DIAGNOSIS — J9601 Acute respiratory failure with hypoxia: Secondary | ICD-10-CM | POA: Diagnosis not present

## 2019-11-07 DIAGNOSIS — I5032 Chronic diastolic (congestive) heart failure: Secondary | ICD-10-CM | POA: Diagnosis not present

## 2019-11-07 DIAGNOSIS — Z96611 Presence of right artificial shoulder joint: Secondary | ICD-10-CM | POA: Diagnosis not present

## 2019-11-07 DIAGNOSIS — J9610 Chronic respiratory failure, unspecified whether with hypoxia or hypercapnia: Secondary | ICD-10-CM | POA: Diagnosis not present

## 2019-11-07 DIAGNOSIS — J441 Chronic obstructive pulmonary disease with (acute) exacerbation: Secondary | ICD-10-CM | POA: Diagnosis not present

## 2019-11-07 DIAGNOSIS — J449 Chronic obstructive pulmonary disease, unspecified: Secondary | ICD-10-CM | POA: Diagnosis not present

## 2019-11-10 ENCOUNTER — Ambulatory Visit: Payer: Medicare HMO | Admitting: Physical Therapy

## 2019-11-11 ENCOUNTER — Ambulatory Visit: Payer: Medicare HMO | Admitting: Physical Therapy

## 2019-11-11 ENCOUNTER — Other Ambulatory Visit: Payer: Self-pay

## 2019-11-11 ENCOUNTER — Encounter: Payer: Self-pay | Admitting: Physical Therapy

## 2019-11-11 DIAGNOSIS — M6281 Muscle weakness (generalized): Secondary | ICD-10-CM

## 2019-11-11 DIAGNOSIS — R293 Abnormal posture: Secondary | ICD-10-CM | POA: Diagnosis not present

## 2019-11-11 DIAGNOSIS — R2689 Other abnormalities of gait and mobility: Secondary | ICD-10-CM | POA: Diagnosis not present

## 2019-11-11 DIAGNOSIS — R269 Unspecified abnormalities of gait and mobility: Secondary | ICD-10-CM

## 2019-11-11 NOTE — Therapy (Signed)
[place] [facility] [address] [place], [name], [address] Phone: [phone]   Fax:  [phone]  Physical Therapy Treatment  Patient Details  Name: Hannah Powell MRN: [id number] Date of Birth: November 19, 1945 Referring Provider (PT): Dr. Ramonita Lab III   Encounter Date: 11/11/2019   PT End of Session - 11/11/19 1124    Visit Number 10    Number of Visits 17    Date for PT Re-Evaluation 12/03/19    Authorization - Visit Number 2    Authorization - Number of Visits 10    PT Start Time 1112    PT Stop Time 1205    PT Time Calculation (min) 53 min    Activity Tolerance Patient tolerated treatment well;Patient limited by fatigue    Behavior During Therapy [facility] for tasks assessed/performed           Past Medical History:  Diagnosis Date  . Anxiety   . Arthritis   . CHF (congestive heart failure) ([place])   . COPD (chronic obstructive pulmonary disease) ([place])   . Edema    feet/legs  . Hypertension    dr Otho Najjar     . Hypothyroidism   . Leg weakness, bilateral   . Osteoporosis   . Oxygen deficit    2l with bipap at night  . RLS (restless legs syndrome)   . Shortness of breath   . Sleep apnea    bipap  . Wheezing     Past Surgical History:  Procedure Laterality Date  . BACK SURGERY     cervical  . BREAST BIOPSY Left    needle bx-neg  . CATARACT EXTRACTION W/PHACO Left 05/07/2017   Procedure: CATARACT EXTRACTION PHACO AND INTRAOCULAR LENS PLACEMENT (IOC);  Surgeon: Birder Robson, MD;  Location: [facility];  Service: Ophthalmology;  Laterality: Left;  [place] 00:48.7 AP% 11.3 CDE 5.46 Fluid Pack Lot # [id number]  . CATARACT EXTRACTION W/PHACO Right 04/23/2017   Procedure: CATARACT EXTRACTION PHACO AND INTRAOCULAR LENS PLACEMENT ([place]);  Surgeon: Birder Robson, MD;  Location: [facility];  Service: Ophthalmology;  Laterality: Right;  [place] 00:29 AP% 12.8 CDE 3.79 FLUID PACK LOT # [id number] H  . FOOT ARTHROPLASTY    . JOINT  REPLACEMENT     x2 tkr  . TOTAL SHOULDER ARTHROPLASTY Left 07/02/2013   Procedure: LEFT TOTAL SHOULDER ARTHROPLASTY;  Surgeon: Marin Shutter, MD;  Location: [place];  Service: Orthopedics;  Laterality: Left;  . TOTAL SHOULDER ARTHROPLASTY Right 04/10/2018   Procedure: TOTAL REVERSE SHOULDER ARTHROPLASTY;  Surgeon: Justice Britain, MD;  [facility];  Service: Orthopedics;  Laterality: Right;  146min    There were no vitals filed for this visit.   Subjective Assessment - 11/11/19 1115    Subjective Pt. states that she is very tired due to having been up most of the night with sick husband. No pain today and no falls reported.    Pertinent History extensive PMH including intensive care unit stays, rehab facility stays, previously ambulated with RW.. COPD, SOB, HTN. Denies CHF, reported she has been cleared by a cardiologist.    Limitations Standing;Walking;House hold activities    Patient Stated Goals improve balance, be able to get up from floor, use SPC    Currently in Pain? No/denies           There Ex:  Nustep L1 13 mins   5xSTS with airex pad in chair: pt required heavy verbal cueing and tactile cueing to complete. Pt needed cueing  to increase speed and forward lean, engaging glutes sooner in process, and to put feet more underneath her. Pt able to complete 3 independent stands, one with PT blocking forward movement of knees.   STS practice off blue mat: 3x  Walking around clinic and to/from car: pt ambluated around clinic and to/from car with SBA and RW. Pt demonstrated difficulty getting into car with R hip flexion. Pt ambulates with decreased gait speed, decreased bilat step length, and trendelenburg gait bilat.       PT Long Term Goals - 11/05/19 1540      PT LONG TERM GOAL #1   Title Pt. will improve 5XSTS to <15 sec to improve functional mobility.    Baseline initial: 20.17s; 8/19: will test next session due to fatigue    Time 4    Period Weeks    Status On-going     Target Date 12/03/19      PT LONG TERM GOAL #2   Title Pt. improve Berg balance score to >45/56 to be safe ambulating with SPC to improve independence.    Baseline Initial: 28/56; 8/19: will test next session due to fatigue    Time 4    Period Weeks    Status On-going    Target Date 12/03/19      PT LONG TERM GOAL #3   Title Pt. will demonstrate improved normal gait speed to 0.4 m/s to decrease falls risk and improve ability to safely walk in community.    Baseline initial: 0.242m/s; 8/19: will test next session due to fatigue    Time 4    Period Weeks    Status On-going    Target Date 12/03/19                 Plan - 11/11/19 1208    Clinical Impression Statement Pt. very tired for today's session. Attempted to assess 5XSTS, however pt expereinced difficulty with recall on proper technique. Session focused on proper technique wiht heavy verbal cueing and one rep with PT blocking forward momentum of knees during STS. Pt. will continue to benefit from skilled PT to improve strength and gait quality to decrease falls risk.    Personal Factors and Comorbidities Age;Comorbidity 3+    Comorbidities CPD, HTN, Obesity    Stability/Clinical Decision Making Evolving/Moderate complexity    Clinical Decision Making Moderate    Rehab Potential Good    PT Frequency 2x / week    PT Duration 4 weeks    PT Treatment/Interventions Cryotherapy;Electrical Stimulation;Ultrasound;Moist Heat;Gait training;Functional mobility training;Therapeutic activities;Therapeutic exercise;Neuromuscular re-education;Scar mobilization;Balance training;Stair training;Patient/family education;Manual techniques    PT Next Visit Plan Progress strengthening/ balance with least assistive device.  CHECK GOALS/ RECERT next tx.    Consulted and Agree with Plan of Care Patient           Patient will benefit from skilled therapeutic intervention in order to improve the following deficits and impairments:  Decreased  endurance, Decreased range of motion, Decreased skin integrity, Decreased strength, Hypomobility, Impaired UE functional use, Increased fascial restricitons, Pain, Postural dysfunction, Impaired flexibility, Decreased scar mobility, Decreased mobility, Decreased balance, Improper body mechanics, Abnormal gait, Decreased activity tolerance, Impaired sensation  Visit Diagnosis: Imbalance  Muscle weakness (generalized)  Abnormal posture  Gait difficulty     Problem List Patient Active Problem List   Diagnosis Date Noted  . S/P reverse total shoulder arthroplasty, 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  . Chronic diastolic heart failure ([place]) 08/09/2016  . COPD (chronic obstructive pulmonary disease) ([place]) 08/09/2016  .  Pulmonary infiltrates   . Pneumonia due to Streptococcus pneumoniae ([place])   . Fever   . Community acquired pneumonia of right lung   . Palliative care by specialist   . Goals of care, counseling/discussion   . Pressure injury of skin 07/03/2016  . COPD exacerbation ([place]) 07/21/2015  . Acute renal insufficiency 07/21/2015  . Hyperglycemia 07/21/2015  . Benign essential HTN 07/21/2015  . S/P shoulder replacement 07/02/2013   Pura Spice, PT, DPT # [id number] Carlyle Basques, SPT 11/11/2019, 2:19 PM  [place] [facility] [address] [place], [name], [address] Phone: [phone]   Fax:  [phone]  Name: Hannah Powell MRN: [id number] Date of Birth: 1945-05-12

## 2019-11-12 ENCOUNTER — Encounter: Payer: Medicare HMO | Admitting: Physical Therapy

## 2019-11-12 DIAGNOSIS — J208 Acute bronchitis due to other specified organisms: Secondary | ICD-10-CM | POA: Diagnosis not present

## 2019-11-16 DIAGNOSIS — I110 Hypertensive heart disease with heart failure: Secondary | ICD-10-CM | POA: Diagnosis not present

## 2019-11-16 DIAGNOSIS — J9611 Chronic respiratory failure with hypoxia: Secondary | ICD-10-CM | POA: Diagnosis not present

## 2019-11-16 DIAGNOSIS — J208 Acute bronchitis due to other specified organisms: Secondary | ICD-10-CM | POA: Diagnosis not present

## 2019-11-16 DIAGNOSIS — J449 Chronic obstructive pulmonary disease, unspecified: Secondary | ICD-10-CM | POA: Diagnosis not present

## 2019-11-16 DIAGNOSIS — U071 COVID-19: Secondary | ICD-10-CM | POA: Diagnosis not present

## 2019-11-16 DIAGNOSIS — I509 Heart failure, unspecified: Secondary | ICD-10-CM | POA: Diagnosis not present

## 2019-11-17 ENCOUNTER — Ambulatory Visit: Payer: Medicare HMO | Admitting: Physical Therapy

## 2019-11-17 DIAGNOSIS — I517 Cardiomegaly: Secondary | ICD-10-CM | POA: Diagnosis not present

## 2019-11-17 DIAGNOSIS — J208 Acute bronchitis due to other specified organisms: Secondary | ICD-10-CM | POA: Diagnosis not present

## 2019-11-17 DIAGNOSIS — U071 COVID-19: Secondary | ICD-10-CM | POA: Diagnosis not present

## 2019-11-17 DIAGNOSIS — J209 Acute bronchitis, unspecified: Secondary | ICD-10-CM | POA: Diagnosis not present

## 2019-11-19 ENCOUNTER — Ambulatory Visit: Payer: Medicare HMO | Admitting: Physical Therapy

## 2019-11-19 DIAGNOSIS — J208 Acute bronchitis due to other specified organisms: Secondary | ICD-10-CM | POA: Diagnosis not present

## 2019-11-19 DIAGNOSIS — U071 COVID-19: Secondary | ICD-10-CM | POA: Diagnosis not present

## 2019-11-24 ENCOUNTER — Encounter: Payer: Self-pay | Admitting: Physical Therapy

## 2019-11-24 ENCOUNTER — Telehealth: Payer: Self-pay | Admitting: Internal Medicine

## 2019-11-24 ENCOUNTER — Ambulatory Visit: Payer: Medicare HMO | Attending: Internal Medicine

## 2019-11-24 ENCOUNTER — Other Ambulatory Visit: Payer: Self-pay

## 2019-11-24 DIAGNOSIS — M6281 Muscle weakness (generalized): Secondary | ICD-10-CM

## 2019-11-24 DIAGNOSIS — Z96611 Presence of right artificial shoulder joint: Secondary | ICD-10-CM | POA: Diagnosis not present

## 2019-11-24 DIAGNOSIS — M25511 Pain in right shoulder: Secondary | ICD-10-CM | POA: Diagnosis not present

## 2019-11-24 DIAGNOSIS — G8929 Other chronic pain: Secondary | ICD-10-CM | POA: Diagnosis not present

## 2019-11-24 DIAGNOSIS — R2689 Other abnormalities of gait and mobility: Secondary | ICD-10-CM | POA: Insufficient documentation

## 2019-11-24 DIAGNOSIS — R269 Unspecified abnormalities of gait and mobility: Secondary | ICD-10-CM

## 2019-11-24 DIAGNOSIS — M25611 Stiffness of right shoulder, not elsewhere classified: Secondary | ICD-10-CM | POA: Insufficient documentation

## 2019-11-24 DIAGNOSIS — R293 Abnormal posture: Secondary | ICD-10-CM | POA: Insufficient documentation

## 2019-11-24 NOTE — Telephone Encounter (Signed)
Patient understands we need to listen to her lungs and she will come to the office for her next OV.

## 2019-11-24 NOTE — Therapy (Addendum)
[place] [facility] [address]. [place], [name], [address] Phone: [phone]   Fax:  [phone]  Physical Therapy Treatment  Patient Details  Name: Hannah Powell MRN: [id number] Date of Birth: [birth year] Referring Provider (PT): Dr. Ramonita Lab III   Encounter Date: 11/24/2019    11/24/19 1514  PT Visits / Re-Eval  Visit Number 11  Number of Visits 17  Date for PT Re-Evaluation 12/03/19  Authorization  Authorization - Visit Number 3  Authorization - Number of Visits 10  PT Time Calculation  PT Start Time 4801  PT Stop Time 1603  PT Time Calculation (min) 51 min  PT - End of Session  Activity Tolerance Patient tolerated treatment well;Patient limited by fatigue  Behavior During Therapy [facility] for tasks assessed/performed    Past Medical History:  Diagnosis Date  . Anxiety   . Arthritis   . CHF (congestive heart failure) ([place])   . COPD (chronic obstructive pulmonary disease) ([place])   . Edema    feet/legs  . Hypertension    dr Otho Najjar     . Hypothyroidism   . Leg weakness, bilateral   . Osteoporosis   . Oxygen deficit    2l with bipap at night  . RLS (restless legs syndrome)   . Shortness of breath   . Sleep apnea    bipap  . Wheezing     Past Surgical History:  Procedure Laterality Date  . BACK SURGERY     cervical  . BREAST BIOPSY Left    needle bx-neg  . CATARACT EXTRACTION W/PHACO Left 05/07/2017   Procedure: CATARACT EXTRACTION PHACO AND INTRAOCULAR LENS PLACEMENT (IOC);  Surgeon: Birder Robson, MD;  Location: [facility];  Service: Ophthalmology;  Laterality: Left;  [place] 00:48.7 AP% 11.3 CDE 5.46 Fluid Pack Lot # [id number]  . CATARACT EXTRACTION W/PHACO Right 04/23/2017   Procedure: CATARACT EXTRACTION PHACO AND INTRAOCULAR LENS PLACEMENT ([place]);  Surgeon: Birder Robson, MD;  Location: [facility];  Service: Ophthalmology;  Laterality: Right;  [place] 00:29 AP% 12.8 CDE 3.79 FLUID PACK LOT # [id number] H  . FOOT  ARTHROPLASTY    . JOINT REPLACEMENT     x2 tkr  . TOTAL SHOULDER ARTHROPLASTY Left 07/02/2013   Procedure: LEFT TOTAL SHOULDER ARTHROPLASTY;  Surgeon: Marin Shutter, MD;  Location: [place];  Service: Orthopedics;  Laterality: Left;  . TOTAL SHOULDER ARTHROPLASTY Right 04/10/2018   Procedure: TOTAL REVERSE SHOULDER ARTHROPLASTY;  Surgeon: Justice Britain, MD;  [facility];  Service: Orthopedics;  Laterality: Right;  166min    There were no vitals filed for this visit.   Subjective Assessment - 11/24/19 1511    Subjective Pt. states that she is feeling better, not fully energized but "on the other side of it." No pain.    Pertinent History extensive PMH including intensive care unit stays, rehab facility stays, previously ambulated with RW.. COPD, SOB, HTN. Denies CHF, reported she has been cleared by a cardiologist.    Limitations Standing;Walking;House hold activities    Patient Stated Goals improve balance, be able to get up from floor, use SPC    Currently in Pain? No/denies            There Ex:  STS practice from blue mat: initial mat height about 6-8" above normal height chair, decreased height to about chair height. Pt unable to complete STS from chair height, able to complete from 1" above chair height or taller. Pt required verbal cueing and min assist  to come to standing, required more assistance the lower the height. Pt able to complete about 10 stands total.   Step up onto 6" step in // bars: pt practicing leading with R leg up onto step. Pt required verbal and tactile cueing to maintain knee position from translating too far forward. Pt able to complete with only one hand on // bar.   Seated LAQ: 10x each leg  Seated marches: 10x each leg  Seated bilat shoulder abd: 10x   Nustep: 0.5 miles, L4.  Neuro re-ed:  Tandem walking in // bars: pt able to complete with CGA  Step over 6" hurdles: 5x in // bars with CGA and hands on // bars                  PT  Long Term Goals - 11/05/19 1540      PT LONG TERM GOAL #1   Title Pt. will improve 5XSTS to <15 sec to improve functional mobility.    Baseline initial: 20.17s; 8/19: will test next session due to fatigue    Time 4    Period Weeks    Status On-going    Target Date 12/03/19      PT LONG TERM GOAL #2   Title Pt. improve Berg balance score to >45/56 to be safe ambulating with SPC to improve independence.    Baseline Initial: 28/56; 8/19: will test next session due to fatigue    Time 4    Period Weeks    Status On-going    Target Date 12/03/19      PT LONG TERM GOAL #3   Title Pt. will demonstrate improved normal gait speed to 0.4 m/s to decrease falls risk and improve ability to safely walk in community.    Baseline initial: 0.279m/s; 8/19: will test next session due to fatigue    Time 4    Period Weeks    Status On-going    Target Date 12/03/19                11/24/19 1557  Plan  Clinical Impression Statement Pt. is still limited by fatigue today, 5xSTS will be assessed at later date. Pt. still struggling to complete with proper technique, but able to complete from various heights. Pt. complete approx 10 STS with heavy verbal cueing for proper technique. Pt. able to complete stepping over hurdles and 6" step ups with mod verbal and tactile cueing for knee positioning and maintaining glute med contraction to decrease hip drop on stance leg bilat. Pt. will continue to benefit from skilled PT to improve strength and gait quality to decrease falls risk.  Personal Factors and Comorbidities Age;Comorbidity 3+  Comorbidities CPD, HTN, Obesity  Pt will benefit from skilled therapeutic intervention in order to improve on the following deficits Decreased endurance;Decreased range of motion;Decreased skin integrity;Decreased strength;Hypomobility;Impaired UE functional use;Increased fascial restricitons;Pain;Postural dysfunction;Impaired flexibility;Decreased scar mobility;Decreased  mobility;Decreased balance;Improper body mechanics;Abnormal gait;Decreased activity tolerance;Impaired sensation  Stability/Clinical Decision Making Evolving/Moderate complexity  Clinical Decision Making Moderate  Rehab Potential Good  PT Frequency 2x / week  PT Duration 4 weeks  PT Treatment/Interventions Cryotherapy;Electrical Stimulation;Ultrasound;Moist Heat;Gait training;Functional mobility training;Therapeutic activities;Therapeutic exercise;Neuromuscular re-education;Scar mobilization;Balance training;Stair training;Patient/family education;Manual techniques  PT Next Visit Plan Progress strengthening/ balance with least assistive device.  CHECK GOALS/ RECERT next tx.  Consulted and Agree with Plan of Care Patient     Visit Diagnosis: Imbalance  Muscle weakness (generalized)  Abnormal posture  Gait difficulty     Problem List  Patient Active Problem List   Diagnosis Date Noted  . S/P reverse total shoulder arthroplasty, 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  . Chronic diastolic heart failure ([place]) 08/09/2016  . COPD (chronic obstructive pulmonary disease) ([place]) 08/09/2016  . Pulmonary infiltrates   . Pneumonia due to Streptococcus pneumoniae ([place])   . Fever   . Community acquired pneumonia of right lung   . Palliative care by specialist   . Goals of care, counseling/discussion   . Pressure injury of skin 07/03/2016  . COPD exacerbation ([place]) 07/21/2015  . Acute renal insufficiency 07/21/2015  . Hyperglycemia 07/21/2015  . Benign essential HTN 07/21/2015  . S/P shoulder replacement 07/02/2013    Carlyle Basques, SPT 11/24/2019, 3:29 PM  [facility] [address] [place], [name], [address] Phone: [phone]   Fax:  [phone]  Name: Hannah Powell MRN: [id number] Date of Birth: [birth year]

## 2019-11-26 ENCOUNTER — Ambulatory Visit: Payer: Medicare HMO

## 2019-11-26 ENCOUNTER — Other Ambulatory Visit: Payer: Self-pay

## 2019-11-26 ENCOUNTER — Encounter: Payer: Self-pay | Admitting: Physical Therapy

## 2019-11-26 DIAGNOSIS — R293 Abnormal posture: Secondary | ICD-10-CM

## 2019-11-26 DIAGNOSIS — R269 Unspecified abnormalities of gait and mobility: Secondary | ICD-10-CM

## 2019-11-26 DIAGNOSIS — M25611 Stiffness of right shoulder, not elsewhere classified: Secondary | ICD-10-CM | POA: Diagnosis not present

## 2019-11-26 DIAGNOSIS — M6281 Muscle weakness (generalized): Secondary | ICD-10-CM

## 2019-11-26 DIAGNOSIS — G8929 Other chronic pain: Secondary | ICD-10-CM

## 2019-11-26 DIAGNOSIS — M25511 Pain in right shoulder: Secondary | ICD-10-CM

## 2019-11-26 DIAGNOSIS — R2689 Other abnormalities of gait and mobility: Secondary | ICD-10-CM | POA: Diagnosis not present

## 2019-11-26 DIAGNOSIS — Z96611 Presence of right artificial shoulder joint: Secondary | ICD-10-CM | POA: Diagnosis not present

## 2019-11-26 NOTE — Therapy (Addendum)
[facility] [address] [place], [name], [address] Phone: [phone]   Fax:  [phone]  Physical Therapy Treatment  Patient Details  Name: Hannah Powell MRN: [id number] Date of Birth: 14-Mar-1946 Referring Provider (PT): Dr. Ramonita Lab III   Encounter Date: 11/26/2019   PT End of Session - 11/26/19 1425    Visit Number 12    Number of Visits 17    Date for PT Re-Evaluation 12/03/19    Authorization - Visit Number 4    Authorization - Number of Visits 10    PT Start Time 9458    Activity Tolerance Patient tolerated treatment well;Patient limited by fatigue    Behavior During Therapy [facility] for tasks assessed/performed           Past Medical History:  Diagnosis Date  . Anxiety   . Arthritis   . CHF (congestive heart failure) ([place])   . COPD (chronic obstructive pulmonary disease) ([place])   . Edema    feet/legs  . Hypertension    dr Otho Najjar     . Hypothyroidism   . Leg weakness, bilateral   . Osteoporosis   . Oxygen deficit    2l with bipap at night  . RLS (restless legs syndrome)   . Shortness of breath   . Sleep apnea    bipap  . Wheezing     Past Surgical History:  Procedure Laterality Date  . BACK SURGERY     cervical  . BREAST BIOPSY Left    needle bx-neg  . CATARACT EXTRACTION W/PHACO Left 05/07/2017   Procedure: CATARACT EXTRACTION PHACO AND INTRAOCULAR LENS PLACEMENT (IOC);  Surgeon: Birder Robson, MD;  Location: [facility];  Service: Ophthalmology;  Laterality: Left;  [place] 00:48.7 AP% 11.3 CDE 5.46 Fluid Pack Lot # [id number]  . CATARACT EXTRACTION W/PHACO Right 04/23/2017   Procedure: CATARACT EXTRACTION PHACO AND INTRAOCULAR LENS PLACEMENT ([place]);  Surgeon: Birder Robson, MD;  Location: [facility];  Service: Ophthalmology;  Laterality: Right;  [place] 00:29 AP% 12.8 CDE 3.79 FLUID PACK LOT # [id number]  . FOOT ARTHROPLASTY    . JOINT REPLACEMENT     x2 tkr  . TOTAL SHOULDER ARTHROPLASTY Left  07/02/2013   Procedure: LEFT TOTAL SHOULDER ARTHROPLASTY;  Surgeon: Marin Shutter, MD;  Location: [place];  Service: Orthopedics;  Laterality: Left;  . TOTAL SHOULDER ARTHROPLASTY Right 04/10/2018   Procedure: TOTAL REVERSE SHOULDER ARTHROPLASTY;  Surgeon: Justice Britain, MD;  [facility];  Service: Orthopedics;  Laterality: Right;  167min    There were no vitals filed for this visit.   Subjective Assessment - 11/26/19 1425    Subjective Pt. states that she is feeling more energetic. No pain, no falls reported.    Pertinent History extensive PMH including intensive care unit stays, rehab facility stays, previously ambulated with RW.. COPD, SOB, HTN. Denies CHF, reported she has been cleared by a cardiologist.    Limitations Standing;Walking;House hold activities    Patient Stated Goals improve balance, be able to get up from floor, use SPC    Currently in Pain? No/denies             There Ex:  STS from standard height chair: difficulty standing with no BUE use, required min Ax2 to stand, completed 4-5 reps with that level of assist. Pt able to complete at least 10 reps using only L UE to come to standing with heavy verbal and tactile cueing for increasing forward lean. Pt able  to independently clear multiple stands with bilat UEs.   Nustep L4 10 mins   Neuro Re-Ed:  Forward BUE reaches with 3 lbs DB with dowel on RW as visual target. X20. Verbal cueing to improve forward lean. Progressively moved walker with dowel further away from pt.  Ambulation in hallway: Practicing turning with RW. 5 min. L Turning with cone stacks: x15cones. Fear of falling. CGA+1, chair nearby for seated rest. Pt very fatigued at end of activity. Pt encouraged to decrease UE support as able to improve balance challenge.              PT Long Term Goals - 11/05/19 1540      PT LONG TERM GOAL #1   Title Pt. will improve 5XSTS to <15 sec to improve functional mobility.    Baseline initial: 20.17s;  8/19: will test next session due to fatigue    Time 4    Period Weeks    Status On-going    Target Date 12/03/19      PT LONG TERM GOAL #2   Title Pt. improve Berg balance score to >45/56 to be safe ambulating with SPC to improve independence.    Baseline Initial: 28/56; 8/19: will test next session due to fatigue    Time 4    Period Weeks    Status On-going    Target Date 12/03/19      PT LONG TERM GOAL #3   Title Pt. will demonstrate improved normal gait speed to 0.4 m/s to decrease falls risk and improve ability to safely walk in community.    Baseline initial: 0.244m/s; 8/19: will test next session due to fatigue    Time 4    Period Weeks    Status On-going    Target Date 12/03/19              11/26/19 1506  Plan  Clinical Impression Statement Pt. limited slightly by fatigue today. Pt. continued to progress STS technique, able to stand from standard chair with 1 hand independently for multiple reps, and required min assitx2 to come to standing without use of UE. Pt. reports that she feels unsteady with STS during the initial lean of technique, during turning, and during reaching overhead. Pt. will continue to benefit from skilled PT to improve balance, strength, and ambulation to decrease falls risk.  Personal Factors and Comorbidities Age;Comorbidity 3+  Comorbidities CPD, HTN, Obesity  Pt will benefit from skilled therapeutic intervention in order to improve on the following deficits Decreased endurance;Decreased range of motion;Decreased skin integrity;Decreased strength;Hypomobility;Impaired UE functional use;Increased fascial restricitons;Pain;Postural dysfunction;Impaired flexibility;Decreased scar mobility;Decreased mobility;Decreased balance;Improper body mechanics;Abnormal gait;Decreased activity tolerance;Impaired sensation  Stability/Clinical Decision Making Evolving/Moderate complexity  Clinical Decision Making Moderate  Rehab Potential Good  PT Frequency 2x /  week  PT Duration 4 weeks  PT Treatment/Interventions Cryotherapy;Electrical Stimulation;Ultrasound;Moist Heat;Gait training;Functional mobility training;Therapeutic activities;Therapeutic exercise;Neuromuscular re-education;Scar mobilization;Balance training;Stair training;Patient/family education;Manual techniques  PT Next Visit Plan Progress strengthening/ balance with least assistive device.  CHECK GOALS/ RECERT next tx.  Consulted and Agree with Plan of Care Patient    Visit Diagnosis: Imbalance  Muscle weakness (generalized)  Abnormal posture  Gait difficulty  S/P reverse total shoulder arthroplasty, right  Stiffness of right shoulder, not elsewhere classified  Chronic right shoulder pain  Muscle weakness of right upper extremity     Problem List Patient Active Problem List   Diagnosis Date Noted  . S/P reverse total shoulder arthroplasty, 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  . Chronic diastolic heart failure (  [place]) 08/09/2016  . COPD (chronic obstructive pulmonary disease) ([place]) 08/09/2016  . Pulmonary infiltrates   . Pneumonia due to Streptococcus pneumoniae ([place])   . Fever   . Community acquired pneumonia of right lung   . Palliative care by specialist   . Goals of care, counseling/discussion   . Pressure injury of skin 07/03/2016  . COPD exacerbation ([place]) 07/21/2015  . Acute renal insufficiency 07/21/2015  . Hyperglycemia 07/21/2015  . Benign essential HTN 07/21/2015  . S/P shoulder replacement 07/02/2013    Carlyle Basques, SPT 11/26/2019, 2:26 PM  [facility] [address]. [place], [name], [address] Phone: [phone]   Fax:  [phone]  Name: Hannah Powell MRN: [id number] Date of Birth: 1945/05/13

## 2019-11-30 DIAGNOSIS — Z471 Aftercare following joint replacement surgery: Secondary | ICD-10-CM | POA: Diagnosis not present

## 2019-11-30 DIAGNOSIS — M25561 Pain in right knee: Secondary | ICD-10-CM | POA: Diagnosis not present

## 2019-11-30 DIAGNOSIS — Z96651 Presence of right artificial knee joint: Secondary | ICD-10-CM | POA: Diagnosis not present

## 2019-11-30 DIAGNOSIS — I5032 Chronic diastolic (congestive) heart failure: Secondary | ICD-10-CM | POA: Diagnosis not present

## 2019-12-01 ENCOUNTER — Encounter: Payer: Self-pay | Admitting: Physical Therapy

## 2019-12-01 ENCOUNTER — Ambulatory Visit: Payer: Medicare HMO

## 2019-12-01 ENCOUNTER — Other Ambulatory Visit: Payer: Self-pay

## 2019-12-01 DIAGNOSIS — R269 Unspecified abnormalities of gait and mobility: Secondary | ICD-10-CM

## 2019-12-01 DIAGNOSIS — Z96611 Presence of right artificial shoulder joint: Secondary | ICD-10-CM | POA: Diagnosis not present

## 2019-12-01 DIAGNOSIS — G8929 Other chronic pain: Secondary | ICD-10-CM | POA: Diagnosis not present

## 2019-12-01 DIAGNOSIS — R2689 Other abnormalities of gait and mobility: Secondary | ICD-10-CM | POA: Diagnosis not present

## 2019-12-01 DIAGNOSIS — M6281 Muscle weakness (generalized): Secondary | ICD-10-CM

## 2019-12-01 DIAGNOSIS — R293 Abnormal posture: Secondary | ICD-10-CM

## 2019-12-01 DIAGNOSIS — M25611 Stiffness of right shoulder, not elsewhere classified: Secondary | ICD-10-CM | POA: Diagnosis not present

## 2019-12-01 DIAGNOSIS — M25511 Pain in right shoulder: Secondary | ICD-10-CM | POA: Diagnosis not present

## 2019-12-01 NOTE — Therapy (Signed)
[place] [facility] [address] [place], [name], [address] Phone: [phone]   Fax:  [phone]  Physical Therapy Treatment and Recertification Certification period from 11/05/2019 to 12/01/2019   Patient Details  Name: Hannah Powell MRN: [id number] Date of Birth: 1945/06/28 Referring Provider (PT): Dr. Ramonita Lab III   Encounter Date: 12/01/2019   PT End of Session - 12/01/19 1501    Visit Number 13    Number of Visits 25    Date for PT Re-Evaluation 12/29/19    Authorization - Visit Number 1    Authorization - Number of Visits 10    PT Start Time 1508    PT Stop Time 1601    PT Time Calculation (min) 53 min    Activity Tolerance Patient tolerated treatment well;Patient limited by fatigue    Behavior During Therapy [facility] for tasks assessed/performed           Past Medical History:  Diagnosis Date  . Anxiety   . Arthritis   . CHF (congestive heart failure) ([place])   . COPD (chronic obstructive pulmonary disease) ([place])   . Edema    feet/legs  . Hypertension    dr Otho Najjar     . Hypothyroidism   . Leg weakness, bilateral   . Osteoporosis   . Oxygen deficit    2l with bipap at night  . RLS (restless legs syndrome)   . Shortness of breath   . Sleep apnea    bipap  . Wheezing     Past Surgical History:  Procedure Laterality Date  . BACK SURGERY     cervical  . BREAST BIOPSY Left    needle bx-neg  . CATARACT EXTRACTION W/PHACO Left 05/07/2017   Procedure: CATARACT EXTRACTION PHACO AND INTRAOCULAR LENS PLACEMENT (IOC);  Surgeon: Birder Robson, MD;  Location: [facility];  Service: Ophthalmology;  Laterality: Left;  [place] 00:48.7 AP% 11.3 CDE 5.46 Fluid Pack Lot # [id number]  . CATARACT EXTRACTION W/PHACO Right 04/23/2017   Procedure: CATARACT EXTRACTION PHACO AND INTRAOCULAR LENS PLACEMENT ([place]);  Surgeon: Birder Robson, MD;  Location: [facility];  Service: Ophthalmology;  Laterality: Right;  [place] 00:29 AP% 12.8 CDE  3.79 FLUID PACK LOT # [id number]  . FOOT ARTHROPLASTY    . JOINT REPLACEMENT     x2 tkr  . TOTAL SHOULDER ARTHROPLASTY Left 07/02/2013   Procedure: LEFT TOTAL SHOULDER ARTHROPLASTY;  Surgeon: Marin Shutter, MD;  Location: [place];  Service: Orthopedics;  Laterality: Left;  . TOTAL SHOULDER ARTHROPLASTY Right 04/10/2018   Procedure: TOTAL REVERSE SHOULDER ARTHROPLASTY;  Surgeon: Justice Britain, MD;  [facility];  Service: Orthopedics;  Laterality: Right;  196mn    There were no vitals filed for this visit.   Subjective Assessment - 12/01/19 1509    Subjective Pt. states that last Thurs/Fri her R knee hurt 9/10 pain. Pt. states she iced it and took ibuprofen, used a Salonpas patch which helped. Pt. states her current knee pain is 5/10. Pt states that stairs, STS, and walking have been painful, however walking is improving. Pt. saw MD and received imaging for R knee.    Pertinent History extensive PMH including intensive care unit stays, rehab facility stays, previously ambulated with RW.. COPD, SOB, HTN. Denies CHF, reported she has been cleared by a cardiologist.    Limitations Standing;Walking;House hold activities    Patient Stated Goals improve balance, be able to get up from floor, use SPC    Currently in Pain?  Yes    Pain Score 5     Pain Location Knee    Pain Orientation Right             There Ex:  Nustep L1: 11 mins.   5xSTS attempted today, discontinued due to acute knee pain that started prior weekend. After attempt, pt requested ice be put on the knee in sitting, ice pack was applied for approx 5 mins .   Neuro Re-ed:  Gait speed: 0.38 m/s normal speed, 0.43 m/s   Berg balance score: 34/56. Pt was unable to complete SLS, 360 deg turns, and alternating foot taps due to lack of confidence or required UE support from // bars. Pt was given opportunities to complete those activities with support as needed.       PT Long Term Goals - 12/01/19 1533      PT LONG TERM  GOAL #1   Title Pt. will improve 5XSTS to <15 sec to improve functional mobility.    Baseline initial: 20.17s; 8/19: will test next session due to fatigue; 9/14: attempted assessment, discontinued due to acute R knee pain. Deferred to next session.    Time 4    Period Weeks    Status On-going    Target Date 12/29/19      PT LONG TERM GOAL #2   Title Pt. improve Berg balance score to >45/56 to be safe ambulating with SPC to improve independence.    Baseline Initial: 28/56; 8/19: will test next session due to fatigue. 9/14: 34/56    Time 4    Period Weeks    Status Partially Met    Target Date 12/29/19      PT LONG TERM GOAL #3   Title Pt. will demonstrate improved normal gait speed to 0.4 m/s to decrease falls risk and improve ability to safely walk in community.    Baseline initial: 0.214ms; 8/19: will test next session due to fatigue; 9/14: 0.38 m/s    Time 4    Period Weeks    Status Partially Met    Target Date 12/29/19                 Plan - 12/01/19 1623    Clinical Impression Statement Pt. reasessed goals today. Patient has demonstrated progress in gait speed goal and Berg balance goal. Pt has improved her normal gait speed to 0.382m, and has improved her Berg balance score to 34/56. She was unable to attempt 5XSTS goal secondary to recent onset R knee pain, will be defferred to next session. Despite pt missing approximately a month of visits total due to family and covid related absences, she still has made progress in her goals and remains very motivated to participate in therapy and is compliant with her HEP. Pt. will continue to benefit from skilled PT to improve her strength, balance, and gait to improve pain free functional mobility and decrease falls risk.    Personal Factors and Comorbidities Age;Comorbidity 3+    Comorbidities CPD, HTN, Obesity    Stability/Clinical Decision Making Evolving/Moderate complexity    Clinical Decision Making Moderate    Rehab  Potential Good    PT Frequency 2x / week    PT Duration 4 weeks    PT Treatment/Interventions Cryotherapy;Electrical Stimulation;Ultrasound;Moist Heat;Gait training;Functional mobility training;Therapeutic activities;Therapeutic exercise;Neuromuscular re-education;Scar mobilization;Balance training;Stair training;Patient/family education;Manual techniques    PT Next Visit Plan --    Consulted and Agree with Plan of Care Patient  Patient will benefit from skilled therapeutic intervention in order to improve the following deficits and impairments:  Decreased endurance, Decreased range of motion, Decreased skin integrity, Decreased strength, Hypomobility, Impaired UE functional use, Increased fascial restricitons, Pain, Postural dysfunction, Impaired flexibility, Decreased scar mobility, Decreased mobility, Decreased balance, Improper body mechanics, Abnormal gait, Decreased activity tolerance, Impaired sensation  Visit Diagnosis: Imbalance  Muscle weakness (generalized)  Abnormal posture  Gait difficulty     Problem List Patient Active Problem List   Diagnosis Date Noted  . S/P reverse total shoulder arthroplasty, 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  . Chronic diastolic heart failure ([place]) 08/09/2016  . COPD (chronic obstructive pulmonary disease) ([place]) 08/09/2016  . Pulmonary infiltrates   . Pneumonia due to Streptococcus pneumoniae ([place])   . Fever   . Community acquired pneumonia of right lung   . Palliative care by specialist   . Goals of care, counseling/discussion   . Pressure injury of skin 07/03/2016  . COPD exacerbation ([place]) 07/21/2015  . Acute renal insufficiency 07/21/2015  . Hyperglycemia 07/21/2015  . Benign essential HTN 07/21/2015  . S/P shoulder replacement 07/02/2013    Carlyle Basques, SPT 12/01/2019, 4:36 PM  [facility] [address] [place], [name], [address] Phone: [phone]   Fax:  [phone]  Name: Hannah Powell MRN: [id number] Date of Birth: 08-01-1945

## 2019-12-02 DIAGNOSIS — B351 Tinea unguium: Secondary | ICD-10-CM | POA: Diagnosis not present

## 2019-12-02 DIAGNOSIS — M79675 Pain in left toe(s): Secondary | ICD-10-CM | POA: Diagnosis not present

## 2019-12-02 DIAGNOSIS — M2032 Hallux varus (acquired), left foot: Secondary | ICD-10-CM | POA: Diagnosis not present

## 2019-12-02 DIAGNOSIS — M79674 Pain in right toe(s): Secondary | ICD-10-CM | POA: Diagnosis not present

## 2019-12-03 ENCOUNTER — Encounter: Payer: Medicare HMO | Admitting: Physical Therapy

## 2019-12-08 ENCOUNTER — Ambulatory Visit: Payer: Medicare HMO | Admitting: Physical Therapy

## 2019-12-08 ENCOUNTER — Encounter: Payer: Self-pay | Admitting: Physical Therapy

## 2019-12-08 ENCOUNTER — Other Ambulatory Visit: Payer: Self-pay

## 2019-12-08 DIAGNOSIS — R293 Abnormal posture: Secondary | ICD-10-CM | POA: Diagnosis not present

## 2019-12-08 DIAGNOSIS — I5032 Chronic diastolic (congestive) heart failure: Secondary | ICD-10-CM | POA: Diagnosis not present

## 2019-12-08 DIAGNOSIS — R2689 Other abnormalities of gait and mobility: Secondary | ICD-10-CM | POA: Diagnosis not present

## 2019-12-08 DIAGNOSIS — J9620 Acute and chronic respiratory failure, unspecified whether with hypoxia or hypercapnia: Secondary | ICD-10-CM | POA: Diagnosis not present

## 2019-12-08 DIAGNOSIS — R269 Unspecified abnormalities of gait and mobility: Secondary | ICD-10-CM

## 2019-12-08 DIAGNOSIS — J9610 Chronic respiratory failure, unspecified whether with hypoxia or hypercapnia: Secondary | ICD-10-CM | POA: Diagnosis not present

## 2019-12-08 DIAGNOSIS — M25611 Stiffness of right shoulder, not elsewhere classified: Secondary | ICD-10-CM | POA: Diagnosis not present

## 2019-12-08 DIAGNOSIS — J449 Chronic obstructive pulmonary disease, unspecified: Secondary | ICD-10-CM | POA: Diagnosis not present

## 2019-12-08 DIAGNOSIS — J441 Chronic obstructive pulmonary disease with (acute) exacerbation: Secondary | ICD-10-CM | POA: Diagnosis not present

## 2019-12-08 DIAGNOSIS — M6281 Muscle weakness (generalized): Secondary | ICD-10-CM

## 2019-12-08 DIAGNOSIS — M25511 Pain in right shoulder: Secondary | ICD-10-CM | POA: Diagnosis not present

## 2019-12-08 DIAGNOSIS — J9601 Acute respiratory failure with hypoxia: Secondary | ICD-10-CM | POA: Diagnosis not present

## 2019-12-08 DIAGNOSIS — Z96611 Presence of right artificial shoulder joint: Secondary | ICD-10-CM | POA: Diagnosis not present

## 2019-12-08 DIAGNOSIS — G8929 Other chronic pain: Secondary | ICD-10-CM | POA: Diagnosis not present

## 2019-12-08 NOTE — Therapy (Signed)
[facility] [address] [place], [name], [address] Phone: [phone]   Fax:  [phone]  Physical Therapy Treatment  Patient Details  Name: Hannah Powell MRN: [id number] Date of Birth: [birth year] Referring Provider (PT): Dr. Ramonita Lab III   Encounter Date: 12/08/2019   PT End of Session - 12/08/19 1528    Visit Number 14    Number of Visits 25    Date for PT Re-Evaluation 12/29/19    Authorization - Visit Number 2    Authorization - Number of Visits 10    PT Start Time 8101    PT Stop Time 1600    PT Time Calculation (min) 45 min    Activity Tolerance Patient tolerated treatment well;Patient limited by fatigue    Behavior During Therapy [facility] for tasks assessed/performed           Past Medical History:  Diagnosis Date  . Anxiety   . Arthritis   . CHF (congestive heart failure) ([place])   . COPD (chronic obstructive pulmonary disease) ([place])   . Edema    feet/legs  . Hypertension    dr Otho Najjar     . Hypothyroidism   . Leg weakness, bilateral   . Osteoporosis   . Oxygen deficit    2l with bipap at night  . RLS (restless legs syndrome)   . Shortness of breath   . Sleep apnea    bipap  . Wheezing     Past Surgical History:  Procedure Laterality Date  . BACK SURGERY     cervical  . BREAST BIOPSY Left    needle bx-neg  . CATARACT EXTRACTION W/PHACO Left 05/07/2017   Procedure: CATARACT EXTRACTION PHACO AND INTRAOCULAR LENS PLACEMENT (IOC);  Surgeon: Birder Robson, MD;  Location: [facility];  Service: Ophthalmology;  Laterality: Left;  [place] 00:48.7 AP% 11.3 CDE 5.46 Fluid Pack Lot # [id number]  . CATARACT EXTRACTION W/PHACO Right 04/23/2017   Procedure: CATARACT EXTRACTION PHACO AND INTRAOCULAR LENS PLACEMENT ([place]);  Surgeon: Birder Robson, MD;  Location: [facility];  Service: Ophthalmology;  Laterality: Right;  [place] 00:29 AP% 12.8 CDE 3.79 FLUID PACK LOT # [id number]  . FOOT ARTHROPLASTY    . JOINT  REPLACEMENT     x2 tkr  . TOTAL SHOULDER ARTHROPLASTY Left 07/02/2013   Procedure: LEFT TOTAL SHOULDER ARTHROPLASTY;  Surgeon: Marin Shutter, MD;  Location: [place];  Service: Orthopedics;  Laterality: Left;  . TOTAL SHOULDER ARTHROPLASTY Right 04/10/2018   Procedure: TOTAL REVERSE SHOULDER ARTHROPLASTY;  Surgeon: Justice Britain, MD;  [facility];  Service: Orthopedics;  Laterality: Right;  174mn    There were no vitals filed for this visit.   Subjective Assessment - 12/08/19 1526    Subjective Pt. states that her R knee has still been bothering her since last week. Pt. states 3/10 pain in R knee. Pt. states that stairs are very painful, getting out of a chair hurts. Today, walking has been better than it has in a week.    Pertinent History extensive PMH including intensive care unit stays, rehab facility stays, previously ambulated with RW.. COPD, SOB, HTN. Denies CHF, reported she has been cleared by a cardiologist.    Limitations Standing;Walking;House hold activities    Patient Stated Goals improve balance, be able to get up from floor, use SPC    Currently in Pain? Yes    Pain Score 3     Pain Location Knee    Pain Orientation Right  There Ex:   Nustep L1 10 mins: unbilled time   Supine chest press with dowell and 5lb ankle weight: 10x, progressed to 8lbs on dowell: 10x. Verbal cueing for decreased speed.   Supine shoulder flex with 2lb weights bilat: 2x10. Verbal cues to decrease speed.  Supine chest press with 2lb dumbbells: 10x. Verbal cues to improve eccentric control.   Supine bicep curls: 1lb weights. 2x10.  Supine horizontal add: 10x with 2lb weights, 10x with 1 lb weights         PT Long Term Goals - 12/01/19 1533      PT LONG TERM GOAL #1   Title Pt. will improve 5XSTS to <15 sec to improve functional mobility.    Baseline initial: 20.17s; 8/19: will test next session due to fatigue; 9/14: attempted assessment, discontinued due to acute R knee  pain. Deferred to next session.    Time 4    Period Weeks    Status On-going    Target Date 12/29/19      PT LONG TERM GOAL #2   Title Pt. improve Berg balance score to >45/56 to be safe ambulating with SPC to improve independence.    Baseline Initial: 28/56; 8/19: will test next session due to fatigue. 9/14: 34/56    Time 4    Period Weeks    Status Partially Met    Target Date 12/29/19      PT LONG TERM GOAL #3   Title Pt. will demonstrate improved normal gait speed to 0.4 m/s to decrease falls risk and improve ability to safely walk in community.    Baseline initial: 0.272ms; 8/19: will test next session due to fatigue; 9/14: 0.38 m/s    Time 4    Period Weeks    Status Partially Met    Target Date 12/29/19                 Plan - 12/08/19 1600    Clinical Impression Statement Pt. continues to have R knee pain, although it is improving since last week. Pt. in supine throughout session with ice on R knee. Pt. reports R knee pain at 3/10 today, still tender to palpation. No swelling or bruising noted. Pt. completed various upper extremity strengthening exercises. Pt. will contine to benefit from skilled PT to improve strength, balance, and gait to decrease falls risk.    Personal Factors and Comorbidities Age;Comorbidity 3+    Comorbidities CPD, HTN, Obesity    Stability/Clinical Decision Making Evolving/Moderate complexity    Clinical Decision Making Moderate    Rehab Potential Good    PT Frequency 2x / week    PT Duration 4 weeks    PT Treatment/Interventions Cryotherapy;Electrical Stimulation;Ultrasound;Moist Heat;Gait training;Functional mobility training;Therapeutic activities;Therapeutic exercise;Neuromuscular re-education;Scar mobilization;Balance training;Stair training;Patient/family education;Manual techniques    PT Next Visit Plan Progress strengthening/ balance with least assistive device.    Consulted and Agree with Plan of Care Patient           Patient  will benefit from skilled therapeutic intervention in order to improve the following deficits and impairments:  Decreased endurance, Decreased range of motion, Decreased skin integrity, Decreased strength, Hypomobility, Impaired UE functional use, Increased fascial restricitons, Pain, Postural dysfunction, Impaired flexibility, Decreased scar mobility, Decreased mobility, Decreased balance, Improper body mechanics, Abnormal gait, Decreased activity tolerance, Impaired sensation  Visit Diagnosis: Imbalance  Muscle weakness (generalized)  Abnormal posture  Gait difficulty     Problem List Patient Active Problem List   Diagnosis Date Noted  .  S/P reverse total shoulder arthroplasty, 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  . Chronic diastolic heart failure ([place]) 08/09/2016  . COPD (chronic obstructive pulmonary disease) ([place]) 08/09/2016  . Pulmonary infiltrates   . Pneumonia due to Streptococcus pneumoniae ([place])   . Fever   . Community acquired pneumonia of right lung   . Palliative care by specialist   . Goals of care, counseling/discussion   . Pressure injury of skin 07/03/2016  . COPD exacerbation ([place]) 07/21/2015  . Acute renal insufficiency 07/21/2015  . Hyperglycemia 07/21/2015  . Benign essential HTN 07/21/2015  . S/P shoulder replacement 07/02/2013   Pura Spice, PT, DPT # [id number] Carlyle Basques, SPT 12/09/2019, 1:09 PM  [facility] [address] [place], [name], [address] Phone: [phone]   Fax:  [phone]  Name: Hannah Powell MRN: [id number] Date of Birth: [birth year]

## 2019-12-10 ENCOUNTER — Other Ambulatory Visit: Payer: Self-pay

## 2019-12-10 ENCOUNTER — Encounter: Payer: Self-pay | Admitting: Physical Therapy

## 2019-12-10 ENCOUNTER — Ambulatory Visit: Payer: Medicare HMO | Admitting: Physical Therapy

## 2019-12-10 DIAGNOSIS — R269 Unspecified abnormalities of gait and mobility: Secondary | ICD-10-CM

## 2019-12-10 DIAGNOSIS — R293 Abnormal posture: Secondary | ICD-10-CM | POA: Diagnosis not present

## 2019-12-10 DIAGNOSIS — M25611 Stiffness of right shoulder, not elsewhere classified: Secondary | ICD-10-CM | POA: Diagnosis not present

## 2019-12-10 DIAGNOSIS — R2689 Other abnormalities of gait and mobility: Secondary | ICD-10-CM | POA: Diagnosis not present

## 2019-12-10 DIAGNOSIS — M6281 Muscle weakness (generalized): Secondary | ICD-10-CM | POA: Diagnosis not present

## 2019-12-10 DIAGNOSIS — G8929 Other chronic pain: Secondary | ICD-10-CM | POA: Diagnosis not present

## 2019-12-10 DIAGNOSIS — Z96611 Presence of right artificial shoulder joint: Secondary | ICD-10-CM | POA: Diagnosis not present

## 2019-12-10 DIAGNOSIS — M25511 Pain in right shoulder: Secondary | ICD-10-CM | POA: Diagnosis not present

## 2019-12-10 NOTE — Therapy (Signed)
[facility] [address]. [place], [name], [address] Phone: [phone]   Fax:  [phone]  Physical Therapy Treatment  Patient Details  Name: Hannah Powell MRN: [id number] Date of Birth: 01/29/1946 Referring Provider (PT): Dr. Ramonita Lab III   Encounter Date: 12/10/2019   PT End of Session - 12/10/19 1514    Visit Number 15    Number of Visits 25    Date for PT Re-Evaluation 12/29/19    Authorization - Visit Number 3    Authorization - Number of Visits 10    PT Start Time 1517    PT Stop Time 1551    PT Time Calculation (min) 44 min    Activity Tolerance Patient tolerated treatment well;Patient limited by fatigue    Behavior During Therapy [facility] for tasks assessed/performed           Past Medical History:  Diagnosis Date  . Anxiety   . Arthritis   . CHF (congestive heart failure) ([place])   . COPD (chronic obstructive pulmonary disease) ([place])   . Edema    feet/legs  . Hypertension    dr Otho Najjar     . Hypothyroidism   . Leg weakness, bilateral   . Osteoporosis   . Oxygen deficit    2l with bipap at night  . RLS (restless legs syndrome)   . Shortness of breath   . Sleep apnea    bipap  . Wheezing     Past Surgical History:  Procedure Laterality Date  . BACK SURGERY     cervical  . BREAST BIOPSY Left    needle bx-neg  . CATARACT EXTRACTION W/PHACO Left 05/07/2017   Procedure: CATARACT EXTRACTION PHACO AND INTRAOCULAR LENS PLACEMENT (IOC);  Surgeon: Birder Robson, MD;  Location: [facility];  Service: Ophthalmology;  Laterality: Left;  [place] 00:48.7 AP% 11.3 CDE 5.46 Fluid Pack Lot # [id number]  . CATARACT EXTRACTION W/PHACO Right 04/23/2017   Procedure: CATARACT EXTRACTION PHACO AND INTRAOCULAR LENS PLACEMENT ([place]);  Surgeon: Birder Robson, MD;  Location: [facility];  Service: Ophthalmology;  Laterality: Right;  [place] 00:29 AP% 12.8 CDE 3.79 FLUID PACK LOT # [id number]  . FOOT ARTHROPLASTY    . JOINT  REPLACEMENT     x2 tkr  . TOTAL SHOULDER ARTHROPLASTY Left 07/02/2013   Procedure: LEFT TOTAL SHOULDER ARTHROPLASTY;  Surgeon: Marin Shutter, MD;  Location: [facility];  Service: Orthopedics;  Laterality: Left;  . TOTAL SHOULDER ARTHROPLASTY Right 04/10/2018   Procedure: TOTAL REVERSE SHOULDER ARTHROPLASTY;  Surgeon: Justice Britain, MD;  Location: [facility];  Service: Orthopedics;  Laterality: Right;  181mn    There were no vitals filed for this visit.   Subjective Assessment - 12/10/19 1512    Subjective Pt. states that R knee is a bit worse today, at 6/10 pain. Pt. believes she stepped wrong and caused increased pain. Pt. states that she is returning to the doctor next week.    Pertinent History extensive PMH including intensive care unit stays, rehab facility stays, previously ambulated with RW.. COPD, SOB, HTN. Denies CHF, reported she has been cleared by a cardiologist.    Limitations Standing;Walking;House hold activities    Patient Stated Goals improve balance, be able to get up from floor, use SPC    Currently in Pain? Yes    Pain Score 6     Pain Location Knee    Pain Orientation Right           There Ex:  Nustep L2 10 mins  (discussed R knee symptoms)   Pt positioned on mat with ice on R knee and red bolster under knees:   Supine chest press with dowell and 8lb ankle weigtst: 3x10 reps. Verbal cueing for decreased speed and for proper hand placement.    Supine RTB D2 pattern: 3x10 each arm  Supine UE horizontal abd against RTB: 3x10   supine 1lb tricep ext with 1lb weight: 3x10 each arm. Cueing for maintaining arm position.   Supine bicep curls against RTB: 3x10         PT Long Term Goals - 12/01/19 1533      PT LONG TERM GOAL #1   Title Pt. will improve 5XSTS to <15 sec to improve functional mobility.    Baseline initial: 20.17s; 8/19: will test next session due to fatigue; 9/14: attempted assessment, discontinued due to acute R knee pain. Deferred to next session.     Time 4    Period Weeks    Status On-going    Target Date 12/29/19      PT LONG TERM GOAL #2   Title Pt. improve Berg balance score to >45/56 to be safe ambulating with SPC to improve independence.    Baseline Initial: 28/56; 8/19: will test next session due to fatigue. 9/14: 34/56    Time 4    Period Weeks    Status Partially Met    Target Date 12/29/19      PT LONG TERM GOAL #3   Title Pt. will demonstrate improved normal gait speed to 0.4 m/s to decrease falls risk and improve ability to safely walk in community.    Baseline initial: 0.220ms; 8/19: will test next session due to fatigue; 9/14: 0.38 m/s    Time 4    Period Weeks    Status Partially Met    Target Date 12/29/19                 Plan - 12/10/19 1552    Clinical Impression Statement Pt. returns to clinic with R knee pain increased to 6/10 since last visit. Pt. completed various supine UE strengthening activities with ice on R knee. Pt. demonstrated good form throughout all exercises. Pt. is returning to MD next week and will address knee with them. Pt. will continue to benefit from skilled PT to improve strength, balance, and gait to improve independence and decrease falls risk.    Personal Factors and Comorbidities Age;Comorbidity 3+    Comorbidities CPD, HTN, Obesity    Stability/Clinical Decision Making Evolving/Moderate complexity    Clinical Decision Making Moderate    Rehab Potential Good    PT Frequency 2x / week    PT Duration 4 weeks    PT Treatment/Interventions Cryotherapy;Electrical Stimulation;Ultrasound;Moist Heat;Gait training;Functional mobility training;Therapeutic activities;Therapeutic exercise;Neuromuscular re-education;Scar mobilization;Balance training;Stair training;Patient/family education;Manual techniques    PT Next Visit Plan Progress strengthening/ balance with least assistive device.    Consulted and Agree with Plan of Care Patient           Patient will benefit from skilled  therapeutic intervention in order to improve the following deficits and impairments:  Decreased endurance, Decreased range of motion, Decreased skin integrity, Decreased strength, Hypomobility, Impaired UE functional use, Increased fascial restricitons, Pain, Postural dysfunction, Impaired flexibility, Decreased scar mobility, Decreased mobility, Decreased balance, Improper body mechanics, Abnormal gait, Decreased activity tolerance, Impaired sensation  Visit Diagnosis: Imbalance  Muscle weakness (generalized)  Abnormal posture  Gait difficulty     Problem List Patient  Active Problem List   Diagnosis Date Noted  . S/P reverse total shoulder arthroplasty, 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  . Chronic diastolic heart failure ([place]) 08/09/2016  . COPD (chronic obstructive pulmonary disease) ([place]) 08/09/2016  . Pulmonary infiltrates   . Pneumonia due to Streptococcus pneumoniae ([place])   . Fever   . Community acquired pneumonia of right lung   . Palliative care by specialist   . Goals of care, counseling/discussion   . Pressure injury of skin 07/03/2016  . COPD exacerbation ([place]) 07/21/2015  . Acute renal insufficiency 07/21/2015  . Hyperglycemia 07/21/2015  . Benign essential HTN 07/21/2015  . S/P shoulder replacement 07/02/2013   Pura Spice, PT, DPT # [id number] Carlyle Basques, SPT 12/11/2019, 9:25 AM  [facility] [address] [place], [name], [address] Phone: [phone]   Fax:  [phone]  Name: Hannah Powell MRN: [id number] Date of Birth: October 15, 1945

## 2019-12-17 ENCOUNTER — Ambulatory Visit: Payer: Medicare HMO | Admitting: Physical Therapy

## 2019-12-17 ENCOUNTER — Other Ambulatory Visit: Payer: Self-pay

## 2019-12-17 ENCOUNTER — Encounter: Payer: Self-pay | Admitting: Physical Therapy

## 2019-12-17 DIAGNOSIS — R269 Unspecified abnormalities of gait and mobility: Secondary | ICD-10-CM

## 2019-12-17 DIAGNOSIS — M7651 Patellar tendinitis, right knee: Secondary | ICD-10-CM | POA: Diagnosis not present

## 2019-12-17 DIAGNOSIS — M6281 Muscle weakness (generalized): Secondary | ICD-10-CM | POA: Diagnosis not present

## 2019-12-17 DIAGNOSIS — Z96611 Presence of right artificial shoulder joint: Secondary | ICD-10-CM | POA: Diagnosis not present

## 2019-12-17 DIAGNOSIS — G8929 Other chronic pain: Secondary | ICD-10-CM | POA: Diagnosis not present

## 2019-12-17 DIAGNOSIS — M25511 Pain in right shoulder: Secondary | ICD-10-CM | POA: Diagnosis not present

## 2019-12-17 DIAGNOSIS — R293 Abnormal posture: Secondary | ICD-10-CM | POA: Diagnosis not present

## 2019-12-17 DIAGNOSIS — R2689 Other abnormalities of gait and mobility: Secondary | ICD-10-CM

## 2019-12-17 DIAGNOSIS — M25611 Stiffness of right shoulder, not elsewhere classified: Secondary | ICD-10-CM | POA: Diagnosis not present

## 2019-12-17 DIAGNOSIS — Z96651 Presence of right artificial knee joint: Secondary | ICD-10-CM | POA: Diagnosis not present

## 2019-12-17 NOTE — Therapy (Signed)
[facility] [address] [place], [name], [address] Phone: [phone]   Fax:  [phone]  Physical Therapy Treatment  Patient Details  Name: Hannah Powell MRN: [id number] Date of Birth: 1946-03-16 Referring Provider (PT): Dr. Ramonita Lab III   Encounter Date: 12/17/2019   PT End of Session - 12/17/19 0934    Visit Number 16    Number of Visits 25    Date for PT Re-Evaluation 12/29/19    Authorization - Visit Number 4    Authorization - Number of Visits 10    PT Start Time 0927    PT Stop Time 1015    PT Time Calculation (min) 48 min    Activity Tolerance Patient tolerated treatment well;Patient limited by fatigue    Behavior During Therapy [facility] for tasks assessed/performed           Past Medical History:  Diagnosis Date  . Anxiety   . Arthritis   . CHF (congestive heart failure) ([place])   . COPD (chronic obstructive pulmonary disease) ([place])   . Edema    feet/legs  . Hypertension    dr Otho Najjar     . Hypothyroidism   . Leg weakness, bilateral   . Osteoporosis   . Oxygen deficit    2l with bipap at night  . RLS (restless legs syndrome)   . Shortness of breath   . Sleep apnea    bipap  . Wheezing     Past Surgical History:  Procedure Laterality Date  . BACK SURGERY     cervical  . BREAST BIOPSY Left    needle bx-neg  . CATARACT EXTRACTION W/PHACO Left 05/07/2017   Procedure: CATARACT EXTRACTION PHACO AND INTRAOCULAR LENS PLACEMENT (IOC);  Surgeon: Birder Robson, MD;  Location: [facility];  Service: Ophthalmology;  Laterality: Left;  [place] 00:48.7 AP% 11.3 CDE 5.46 Fluid Pack Lot # [id number]  . CATARACT EXTRACTION W/PHACO Right 04/23/2017   Procedure: CATARACT EXTRACTION PHACO AND INTRAOCULAR LENS PLACEMENT ([place]);  Surgeon: Birder Robson, MD;  Location: [facility];  Service: Ophthalmology;  Laterality: Right;  [place] 00:29 AP% 12.8 CDE 3.79 FLUID PACK LOT # [id number]  . FOOT ARTHROPLASTY    . JOINT  REPLACEMENT     x2 tkr  . TOTAL SHOULDER ARTHROPLASTY Left 07/02/2013   Procedure: LEFT TOTAL SHOULDER ARTHROPLASTY;  Surgeon: Marin Shutter, MD;  Location: [facility];  Service: Orthopedics;  Laterality: Left;  . TOTAL SHOULDER ARTHROPLASTY Right 04/10/2018   Procedure: TOTAL REVERSE SHOULDER ARTHROPLASTY;  Surgeon: Justice Britain, MD;  [facility];  Service: Orthopedics;  Laterality: Right;  18mn    There were no vitals filed for this visit.   Subjective Assessment - 12/17/19 0933    Subjective Pt. is seeing orthopedist today (Dr. HMarry Guan to assess her R knee. Pt. states knee pain is 5/10.    Pertinent History extensive PMH including intensive care unit stays, rehab facility stays, previously ambulated with RW.. COPD, SOB, HTN. Denies CHF, reported she has been cleared by a cardiologist.    Limitations Standing;Walking;House hold activities    Patient Stated Goals improve balance, be able to get up from floor, use SPC    Currently in Pain? Yes    Pain Score 5     Pain Location Knee    Pain Orientation Right            Ther Ex:   Activities completed supine with red bolster under knees:   RTB resisted  shoulder flex: 15x each arm   RTB resisted horizontal abd: 15x   RTB resisted bicep curls: 2x10 each arm  Scap retraction: 2x15    Chest press with dowel and ankle weights: 10x with 2lbs on dowel, 2x10 with 3lbs  Shoulder ER/IR against RTB: 2x10 each side.          PT Long Term Goals - 12/01/19 1533      PT LONG TERM GOAL #1   Title Pt. will improve 5XSTS to <15 sec to improve functional mobility.    Baseline initial: 20.17s; 8/19: will test next session due to fatigue; 9/14: attempted assessment, discontinued due to acute R knee pain. Deferred to next session.    Time 4    Period Weeks    Status On-going    Target Date 12/29/19      PT LONG TERM GOAL #2   Title Pt. improve Berg balance score to >45/56 to be safe ambulating with SPC to improve independence.     Baseline Initial: 28/56; 8/19: will test next session due to fatigue. 9/14: 34/56    Time 4    Period Weeks    Status Partially Met    Target Date 12/29/19      PT LONG TERM GOAL #3   Title Pt. will demonstrate improved normal gait speed to 0.4 m/s to decrease falls risk and improve ability to safely walk in community.    Baseline initial: 0.228ms; 8/19: will test next session due to fatigue; 9/14: 0.38 m/s    Time 4    Period Weeks    Status Partially Met    Target Date 12/29/19                 Plan - 12/17/19 03646   Clinical Impression Statement Pt. is seeing MD (Dr. HMarry Guan this afternoon to have R knee addressed, pain is currently at 5/10. Pt. continues to progress UE strengthening, continues to demonstrate good form throughout. Pt. able to increase weights/resistance for this session. Pt. will continue to benefit from skilled PT to improve strength, balance, and gait to improve independence with ADLs and decrease falls risk.    Personal Factors and Comorbidities Age;Comorbidity 3+    Comorbidities CPD, HTN, Obesity    Stability/Clinical Decision Making Evolving/Moderate complexity    Clinical Decision Making Moderate    Rehab Potential Good    PT Frequency 2x / week    PT Duration 4 weeks    PT Treatment/Interventions Cryotherapy;Electrical Stimulation;Ultrasound;Moist Heat;Gait training;Functional mobility training;Therapeutic activities;Therapeutic exercise;Neuromuscular re-education;Scar mobilization;Balance training;Stair training;Patient/family education;Manual techniques    PT Next Visit Plan Progress strengthening/ balance with least assistive device.    Consulted and Agree with Plan of Care Patient           Patient will benefit from skilled therapeutic intervention in order to improve the following deficits and impairments:  Decreased endurance, Decreased range of motion, Decreased skin integrity, Decreased strength, Hypomobility, Impaired UE functional use,  Increased fascial restricitons, Pain, Postural dysfunction, Impaired flexibility, Decreased scar mobility, Decreased mobility, Decreased balance, Improper body mechanics, Abnormal gait, Decreased activity tolerance, Impaired sensation  Visit Diagnosis: Imbalance  Muscle weakness (generalized)  Abnormal posture  Gait difficulty     Problem List Patient Active Problem List   Diagnosis Date Noted  . S/P reverse total shoulder arthroplasty, 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  . Chronic diastolic heart failure ([place] 08/09/2016  . COPD (chronic obstructive pulmonary disease) ([place] 08/09/2016  . Pulmonary infiltrates   . Pneumonia due to Streptococcus  pneumoniae ([place])   . Fever   . Community acquired pneumonia of right lung   . Palliative care by specialist   . Goals of care, counseling/discussion   . Pressure injury of skin 07/03/2016  . COPD exacerbation ([place]) 07/21/2015  . Acute renal insufficiency 07/21/2015  . Hyperglycemia 07/21/2015  . Benign essential HTN 07/21/2015  . S/P shoulder replacement 07/02/2013    Carlyle Basques, SPT 12/17/2019, 11:24 AM  This entire session was performed under direct supervision and direction of a licensed therapist/therapist assistant . I have personally read, edited and approve of the note as written. Myles Gip PT, DPT [phone]   [facility] [address] [place], [name], [address] Phone: [phone]   Fax:  [phone]  Name: Hannah Powell MRN: [id number] Date of Birth: 1945-04-01

## 2019-12-21 ENCOUNTER — Other Ambulatory Visit: Payer: Self-pay

## 2019-12-21 ENCOUNTER — Ambulatory Visit: Payer: Medicare HMO | Admitting: Internal Medicine

## 2019-12-21 ENCOUNTER — Encounter: Payer: Self-pay | Admitting: Internal Medicine

## 2019-12-21 VITALS — BP 116/70 | HR 70 | Temp 97.1°F | Ht 66.0 in | Wt 230.0 lb

## 2019-12-21 DIAGNOSIS — J9610 Chronic respiratory failure, unspecified whether with hypoxia or hypercapnia: Secondary | ICD-10-CM

## 2019-12-21 DIAGNOSIS — J449 Chronic obstructive pulmonary disease, unspecified: Secondary | ICD-10-CM

## 2019-12-21 DIAGNOSIS — U071 COVID-19: Secondary | ICD-10-CM

## 2019-12-21 DIAGNOSIS — R0689 Other abnormalities of breathing: Secondary | ICD-10-CM

## 2019-12-21 MED ORDER — BREO ELLIPTA 200-25 MCG/INH IN AEPB: 1.0000 | INHALATION_SPRAY | Freq: Every day | RESPIRATORY_TRACT | 0 refills | Status: AC

## 2019-12-21 NOTE — Patient Instructions (Signed)
Continue BiPAP Continue oxygen Continue inhaler therapy

## 2019-12-21 NOTE — Progress Notes (Signed)
[facility] Consultation     Date: 12/21/2019,   MRN# [id number] Hannah Powell 07-18-1945     CHIEF COMPLAINT:  Follow up COPD Follow up chronic resp failure   HISTORY OF PRESENT ILLNESS   Moderate to severe COPD stable at this time Patient will recent diagnosis of COVID-19 infection Only symptom was cough and extreme fatigue Patient underwent monoclonal antibody infusion Patient did not require hospitalization Patient back to baseline  Remote smoker quit 1970s 2017 FEV1 was 52% predicted   Patient has end-stage COPD requiring oxygen BiPAP therapy for survival   Compliance report reviewed with patient  Patient has excellent compliance report  97 % compliance for days 77%  compliance for greater than 4 hours AHI down to 2.8    No exacerbation at this time No evidence of heart failure at this time No evidence or signs of infection at this time No respiratory distress No fevers, chills, nausea, vomiting, diarrhea No evidence of lower extremity edema No evidence hemoptysis  Continue inhalers as prescribed and doing well       Current Outpatient Medications:  .  albuterol (PROVENTIL) (2.5 MG/3ML) 0.083% nebulizer solution, Take 2.5 mg by nebulization every 6 (six) hours as needed for wheezing or shortness of breath., Disp: , Rfl:  .  ALPRAZolam (XANAX) 0.25 MG tablet, Take 1 tablet (0.25 mg total) by mouth 3 (three) times daily as needed for anxiety., Disp: 30 tablet, Rfl: 0 .  azithromycin (ZITHROMAX) 250 MG tablet, TAKE 1 TABLET (250 MG TOTAL) BY MOUTH DAILY., Disp: 90 tablet, Rfl: 0 .  B Complex-C (B-COMPLEX WITH VITAMIN C) tablet, Take 1 tablet by mouth daily. , Disp: , Rfl:  .  Calcium Carbonate-Vitamin D (CALCIUM-VITAMIN D) 500-200 MG-UNIT per tablet, Take 1 tablet by mouth daily., Disp: , Rfl:  .  cyclobenzaprine (FLEXERIL) 10 MG tablet, Take 10 mg by mouth 3 (three) times daily as needed for muscle spasms. , Disp: , Rfl:  .  ferrous  sulfate 325 (65 FE) MG tablet, Take 325 mg by mouth daily with breakfast., Disp: , Rfl:  .  fluticasone furoate-vilanterol (BREO ELLIPTA) 200-25 MCG/INH AEPB, Inhale 1 puff into the lungs daily., Disp: 1 each, Rfl: 0 .  fluticasone furoate-vilanterol (BREO ELLIPTA) 200-25 MCG/INH AEPB, Inhale 1 puff into the lungs daily., Disp: 60 each, Rfl: 3 .  furosemide (LASIX) 20 MG tablet, Take 2 tablets (40 mg total) by mouth 2 (two) times daily. (Patient taking differently: Take 40-80 mg by mouth See admin instructions. Take 80 mg by mouth in the morning and take 40 mg by mouth at lunch), Disp: , Rfl:  .  gabapentin (NEURONTIN) 300 MG capsule, Take 300 mg by mouth at bedtime. , Disp: , Rfl:  .  ibuprofen (ADVIL,MOTRIN) 200 MG tablet, Take 800 mg by mouth every 6 (six) hours as needed for headache or mild pain. , Disp: , Rfl:  .  ipratropium-albuterol (DUONEB) 0.5-2.5 (3) MG/3ML SOLN, Take 3 mLs by nebulization every 4 (four) hours as needed., Disp: , Rfl:  .  levothyroxine (SYNTHROID, LEVOTHROID) 200 MCG tablet, Take 200 mcg by mouth daily before breakfast., Disp: , Rfl:  .  loratadine-pseudoephedrine (CLARITIN-D 24-HOUR) 10-240 MG 24 hr tablet, Take 1 tablet by mouth daily as needed for allergies., Disp: , Rfl:  .  losartan (COZAAR) 25 MG tablet, Take 25 mg by mouth daily., Disp: , Rfl:  .  Multiple Vitamins-Minerals (MULTIVITAMIN WOMEN PO), Take 1 tablet by mouth daily., Disp: , Rfl:  .  Omega-3 Fatty Acids (OMEGA 3 PO), Take 1 capsule by mouth daily., Disp: , Rfl:  .  oxybutynin (DITROPAN) 5 MG tablet, Take 5 mg by mouth 3 (three) times daily., Disp: , Rfl:  .  potassium chloride SA (K-DUR,KLOR-CON) 20 MEQ tablet, Take 20 mEq by mouth 2 (two) times daily. , Disp: , Rfl:  .  pramipexole (MIRAPEX) 1 MG tablet, Take 4 mg by mouth at bedtime. , Disp: , Rfl:  .  spironolactone (ALDACTONE) 25 MG tablet, Take 25 mg by mouth 2 (two) times daily. , Disp: , Rfl:  .  topiramate (TOPAMAX) 100 MG tablet, Take 100 mg by  mouth 2 (two) times daily., Disp: , Rfl:  .  traMADol (ULTRAM) 50 MG tablet, Take 1 tablet (50 mg total) by mouth every 8 (eight) hours as needed (for mild pain)., Disp: 30 tablet, Rfl: 0 .  zolpidem (AMBIEN) 10 MG tablet, Take 5 mg by mouth at bedtime as needed for sleep., Disp: , Rfl:  .  HYDROcodone-acetaminophen (NORCO) 7.5-325 MG tablet, Take 1 tablet by mouth every 6 (six) hours as needed for moderate pain. (Patient not taking: Reported on 12/21/2019), Disp: 30 tablet, Rfl: 0    Review of Systems:  Gen:  Denies  fever, sweats, chills weight loss  HEENT: Denies blurred vision, double vision, ear pain, eye pain, hearing loss, nose bleeds, sore throat Cardiac:  No dizziness, chest pain or heaviness, chest tightness,edema, No JVD Resp:   No cough, -sputum production, CHRONIC hortness of breath,-wheezing, -hemoptysis,  Gi: Denies swallowing difficulty, stomach pain, nausea or vomiting, diarrhea, constipation, bowel incontinence Gu:  Denies bladder incontinence, burning urine Ext:   Denies Joint pain, stiffness or swelling Skin: Denies  skin rash, easy bruising or bleeding or hives Endoc:  Denies polyuria, polydipsia , polyphagia or weight change Psych:   Denies depression, insomnia or hallucinations  Other:  All other systems negative    BP 116/70 (BP Location: Left Arm, Patient Position: Sitting, Cuff Size: Normal)   Pulse 70   Temp (!) 97.1 F (36.2 C) (Temporal)   Ht 5\' 6"  (1.676 m)   Wt 230 lb (104.3 kg)   SpO2 96%   BMI 37.12 kg/m   Physical Examination:   General Appearance: No distress  Neuro:without focal findings,  speech normal,  HEENT: PERRLA, EOM intact.   Pulmonary: normal breath sounds, No wheezing.  CardiovascularNormal S1,S2.  No m/r/g.   Abdomen: Benign, Soft, non-tender. Renal:  No costovertebral tenderness  GU:  Not performed at this time. Endoc: No evident thyromegaly Skin:   warm, no rashes, no ecchymosis  Extremities: normal, no cyanosis,  clubbing. PSYCHIATRIC: Mood, affect within normal limits.   ALL OTHER ROS ARE NEGATIVE      ASSESSMENT/PLAN    74 year old morbidly obese white female with end-stage COPD Gold stage III with chronic respiratory failure with hypercapnia and hypoxia with deconditioned state  Moderate to severe COPD Gold stage D No exacerbation at this time Seems to be stable on inhaled therapy   Chronic respiratory failure with COPD and hypoxia  Patient compliant with BiPAP  She needs this for survival  Patient has a history of recurrent bouts of COPD exacerbation  Patient is on chronic azithromycin therapy and working well  Continue inhaled steroids and long-acting beta agonist  Albuterol as needed    Chronic respiratory failure with hypoxia and hypercapnia Underlying COPD and OSA Continue oxygen and BiPAP as prescribed  patient needs this for survival Use and benefits from BiPAP therapy  which reduces her hospital admissions    Obesity -recommend significant weight loss -recommend changing diet  Deconditioned state -Recommend increased daily activity and exercise    Recent COVID-19 infection Patient avoid hospitalization and death Status post monoclonal antibody infusion Seems to have recovered from her infection    Total time spent 31 minutes     COVID-19 EDUCATION: The signs and symptoms of COVID-19 were discussed with the patient and how to seek care for testing.  The importance of social distancing was discussed today. Hand Washing Techniques and avoid touching face was advised.     MEDICATION ADJUSTMENTS/LABS AND TESTS ORDERED:  Continue BiPAP Continue oxygen Continue inhaler therapy  CURRENT MEDICATIONS REVIEWED AT LENGTH WITH PATIENT TODAY   Patient satisfied with Plan of action and management. All questions answered  Follow up in 1 year    Hannah Powell Patricia Pesa, M.D.  [facility]  Medical Director [place] Director [facility] Cardio-Pulmonary Department

## 2019-12-22 ENCOUNTER — Ambulatory Visit: Payer: Medicare HMO | Attending: Internal Medicine

## 2019-12-22 ENCOUNTER — Encounter: Payer: Self-pay | Admitting: Physical Therapy

## 2019-12-22 DIAGNOSIS — R2689 Other abnormalities of gait and mobility: Secondary | ICD-10-CM | POA: Diagnosis not present

## 2019-12-22 DIAGNOSIS — R269 Unspecified abnormalities of gait and mobility: Secondary | ICD-10-CM | POA: Insufficient documentation

## 2019-12-22 DIAGNOSIS — R293 Abnormal posture: Secondary | ICD-10-CM

## 2019-12-22 DIAGNOSIS — M6281 Muscle weakness (generalized): Secondary | ICD-10-CM

## 2019-12-22 NOTE — Therapy (Signed)
[facility] [address] [place], [name], [address] Phone: [phone]   Fax:  [phone]  Physical Therapy Treatment  Patient Details  Name: Hannah Powell MRN: [id number] Date of Birth: 1945-03-21 Referring Provider (PT): Dr. Ramonita Lab III   Encounter Date: 12/22/2019   PT End of Session - 12/22/19 1526    Visit Number 17    Number of Visits 25    Date for PT Re-Evaluation 12/29/19    Authorization - Visit Number 5    Authorization - Number of Visits 10    PT Start Time 0932    PT Stop Time 1600    PT Time Calculation (min) 42 min    Activity Tolerance Patient tolerated treatment well;Patient limited by fatigue    Behavior During Therapy [facility] for tasks assessed/performed           Past Medical History:  Diagnosis Date  . Anxiety   . Arthritis   . CHF (congestive heart failure) ([place])   . COPD (chronic obstructive pulmonary disease) ([place])   . Edema    feet/legs  . Hypertension    dr Otho Najjar     . Hypothyroidism   . Leg weakness, bilateral   . Osteoporosis   . Oxygen deficit    2l with bipap at night  . RLS (restless legs syndrome)   . Shortness of breath   . Sleep apnea    bipap  . Wheezing     Past Surgical History:  Procedure Laterality Date  . BACK SURGERY     cervical  . BREAST BIOPSY Left    needle bx-neg  . CATARACT EXTRACTION W/PHACO Left 05/07/2017   Procedure: CATARACT EXTRACTION PHACO AND INTRAOCULAR LENS PLACEMENT (IOC);  Surgeon: Birder Robson, MD;  Location: [facility];  Service: Ophthalmology;  Laterality: Left;  [place] 00:48.7 AP% 11.3 CDE 5.46 Fluid Pack Lot # [id number]  . CATARACT EXTRACTION W/PHACO Right 04/23/2017   Procedure: CATARACT EXTRACTION PHACO AND INTRAOCULAR LENS PLACEMENT ([place]);  Surgeon: Birder Robson, MD;  Location: [facility];  Service: Ophthalmology;  Laterality: Right;  [place] 00:29 AP% 12.8 CDE 3.79 FLUID PACK LOT # [id number]  . FOOT ARTHROPLASTY    . JOINT  REPLACEMENT     x2 tkr  . TOTAL SHOULDER ARTHROPLASTY Left 07/02/2013   Procedure: LEFT TOTAL SHOULDER ARTHROPLASTY;  Surgeon: Marin Shutter, MD;  Location: [place];  Service: Orthopedics;  Laterality: Left;  . TOTAL SHOULDER ARTHROPLASTY Right 04/10/2018   Procedure: TOTAL REVERSE SHOULDER ARTHROPLASTY;  Surgeon: Justice Britain, MD;  Location: [facility];  Service: Orthopedics;  Laterality: Right;  150mn    There were no vitals filed for this visit.   Subjective Assessment - 12/22/19 1524    Subjective Pt. states that orthopedist believes she has tendinitis in her R knee. 4/10 R knee pain today.    Pertinent History extensive PMH including intensive care unit stays, rehab facility stays, previously ambulated with RW.. COPD, SOB, HTN. Denies CHF, reported she has been cleared by a cardiologist.    Limitations Standing;Walking;House hold activities    Patient Stated Goals improve balance, be able to get up from floor, use SPC    Currently in Pain? Yes    Pain Score 5     Pain Location Knee    Pain Orientation Right              There ex:   Nustep L2 10 mins LE/UE (unbilled)  Seated AROM of R  knee: pt experienced pain with end range flexion and ext.  Pt experienced pain with rising from chair in R knee.   Seated quad stretch with strap: 3x30s   Manual:  STM to R quad: Pt tender in R quad muscles, slightly loosened up with STM.   R patellar mobs: grade 2 lateral, medial, superior and inferior mobs to R patella in supine. Pt very sensitive to inferior glides.                      PT Long Term Goals - 12/01/19 1533      PT LONG TERM GOAL #1   Title Pt. will improve 5XSTS to <15 sec to improve functional mobility.    Baseline initial: 20.17s; 8/19: will test next session due to fatigue; 9/14: attempted assessment, discontinued due to acute R knee pain. Deferred to next session.    Time 4    Period Weeks    Status On-going    Target Date 12/29/19      PT LONG  TERM GOAL #2   Title Pt. improve Berg balance score to >45/56 to be safe ambulating with SPC to improve independence.    Baseline Initial: 28/56; 8/19: will test next session due to fatigue. 9/14: 34/56    Time 4    Period Weeks    Status Partially Met    Target Date 12/29/19      PT LONG TERM GOAL #3   Title Pt. will demonstrate improved normal gait speed to 0.4 m/s to decrease falls risk and improve ability to safely walk in community.    Baseline initial: 0.223ms; 8/19: will test next session due to fatigue; 9/14: 0.38 m/s    Time 4    Period Weeks    Status Partially Met    Target Date 12/29/19                 Plan - 12/22/19 1608    Clinical Impression Statement Pt. returns to therapy after MD visit with MD diagnosis of tendinitis. PT assessed knee pain more in depth, found that patinet is experiencing pain mostly with end range flexion and extension, stairs, and with rising from a chair. PT performed STM to R quads and grade 2 patellar mobs in all directions to R knee in supine, pt did not experience much relief from patellar mobs. Pt. instructed on how to complete seated quad stretches at home. Pt. will continue to benefit from skilled PT to improve strength and decrease pain to improve ability to complete ADLs independently and without pain.    Personal Factors and Comorbidities Age;Comorbidity 3+    Comorbidities CPD, HTN, Obesity    Stability/Clinical Decision Making Evolving/Moderate complexity    Clinical Decision Making Moderate    Rehab Potential Good    PT Frequency 2x / week    PT Duration 4 weeks    PT Treatment/Interventions Cryotherapy;Electrical Stimulation;Ultrasound;Moist Heat;Gait training;Functional mobility training;Therapeutic activities;Therapeutic exercise;Neuromuscular re-education;Scar mobilization;Balance training;Stair training;Patient/family education;Manual techniques    PT Next Visit Plan Progress strengthening/ balance with least assistive  device.    Consulted and Agree with Plan of Care Patient           Patient will benefit from skilled therapeutic intervention in order to improve the following deficits and impairments:  Decreased endurance, Decreased range of motion, Decreased skin integrity, Decreased strength, Hypomobility, Impaired UE functional use, Increased fascial restricitons, Pain, Postural dysfunction, Impaired flexibility, Decreased scar mobility, Decreased mobility, Decreased balance, Improper body  mechanics, Abnormal gait, Decreased activity tolerance, Impaired sensation  Visit Diagnosis: Imbalance  Muscle weakness (generalized)  Abnormal posture  Gait difficulty     Problem List Patient Active Problem List   Diagnosis Date Noted  . S/P reverse total shoulder arthroplasty, 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  . Chronic diastolic heart failure ([place]) 08/09/2016  . COPD (chronic obstructive pulmonary disease) ([place]) 08/09/2016  . Pulmonary infiltrates   . Pneumonia due to Streptococcus pneumoniae ([place])   . Fever   . Community acquired pneumonia of right lung   . Palliative care by specialist   . Goals of care, counseling/discussion   . Pressure injury of skin 07/03/2016  . COPD exacerbation ([place]) 07/21/2015  . Acute renal insufficiency 07/21/2015  . Hyperglycemia 07/21/2015  . Benign essential HTN 07/21/2015  . S/P shoulder replacement 07/02/2013    Carlyle Basques, SPT 12/22/2019, 4:15 PM  [facility] [address] [place], [name], [address] Phone: [phone]   Fax:  [phone]  Name: Hannah Powell MRN: [id number] Date of Birth: Jun 20, 1945

## 2019-12-23 ENCOUNTER — Other Ambulatory Visit
Admission: RE | Admit: 2019-12-23 | Discharge: 2019-12-23 | Disposition: A | Discharge status: Home / Self Care | Payer: Medicare HMO | Source: Ambulatory Visit | Attending: Internal Medicine | Admitting: Internal Medicine

## 2019-12-23 DIAGNOSIS — J90 Pleural effusion, not elsewhere classified: Secondary | ICD-10-CM | POA: Diagnosis not present

## 2019-12-23 DIAGNOSIS — J189 Pneumonia, unspecified organism: Secondary | ICD-10-CM | POA: Diagnosis not present

## 2019-12-23 DIAGNOSIS — N183 Chronic kidney disease, stage 3 unspecified: Secondary | ICD-10-CM | POA: Diagnosis not present

## 2019-12-23 DIAGNOSIS — I5032 Chronic diastolic (congestive) heart failure: Secondary | ICD-10-CM | POA: Insufficient documentation

## 2019-12-23 DIAGNOSIS — I130 Hypertensive heart and chronic kidney disease with heart failure and stage 1 through stage 4 chronic kidney disease, or unspecified chronic kidney disease: Secondary | ICD-10-CM | POA: Diagnosis not present

## 2019-12-23 DIAGNOSIS — J438 Other emphysema: Secondary | ICD-10-CM | POA: Diagnosis not present

## 2019-12-23 DIAGNOSIS — G63 Polyneuropathy in diseases classified elsewhere: Secondary | ICD-10-CM | POA: Diagnosis not present

## 2019-12-23 DIAGNOSIS — G4733 Obstructive sleep apnea (adult) (pediatric): Secondary | ICD-10-CM | POA: Diagnosis not present

## 2019-12-23 DIAGNOSIS — J439 Emphysema, unspecified: Secondary | ICD-10-CM | POA: Diagnosis not present

## 2019-12-23 DIAGNOSIS — J9611 Chronic respiratory failure with hypoxia: Secondary | ICD-10-CM | POA: Insufficient documentation

## 2019-12-23 DIAGNOSIS — Z23 Encounter for immunization: Secondary | ICD-10-CM | POA: Diagnosis not present

## 2019-12-23 DIAGNOSIS — Z79899 Other long term (current) drug therapy: Secondary | ICD-10-CM | POA: Diagnosis not present

## 2019-12-23 DIAGNOSIS — I509 Heart failure, unspecified: Secondary | ICD-10-CM | POA: Diagnosis not present

## 2019-12-23 DIAGNOSIS — E034 Atrophy of thyroid (acquired): Secondary | ICD-10-CM | POA: Diagnosis not present

## 2019-12-23 LAB — TROPONIN I (HIGH SENSITIVITY): Troponin I (High Sensitivity): 5 ng/L (ref <18)

## 2019-12-24 ENCOUNTER — Ambulatory Visit: Payer: Medicare HMO | Admitting: Physical Therapy

## 2019-12-24 ENCOUNTER — Encounter: Payer: Self-pay | Admitting: Physical Therapy

## 2019-12-24 ENCOUNTER — Other Ambulatory Visit: Payer: Self-pay

## 2019-12-24 DIAGNOSIS — R2689 Other abnormalities of gait and mobility: Secondary | ICD-10-CM

## 2019-12-24 DIAGNOSIS — M6281 Muscle weakness (generalized): Secondary | ICD-10-CM | POA: Diagnosis not present

## 2019-12-24 DIAGNOSIS — R269 Unspecified abnormalities of gait and mobility: Secondary | ICD-10-CM

## 2019-12-24 DIAGNOSIS — R293 Abnormal posture: Secondary | ICD-10-CM

## 2019-12-24 NOTE — Therapy (Signed)
[facility] [address] [place], [name], [address] Phone: [phone]   Fax:  [phone]  Physical Therapy Treatment  Patient Details  Name: Hannah Powell MRN: [id number] Date of Birth: 1945/07/03 Referring Provider (PT): Dr. Ramonita Lab III   Encounter Date: 12/24/2019   PT End of Session - 12/24/19 1525    Visit Number 18    Number of Visits 25    Date for PT Re-Evaluation 12/29/19    Authorization - Visit Number 6    Authorization - Number of Visits 10    PT Start Time 6237    PT Stop Time 1555    PT Time Calculation (min) 40 min    Activity Tolerance Patient tolerated treatment well;Patient limited by fatigue    Behavior During Therapy [facility] for tasks assessed/performed           Past Medical History:  Diagnosis Date  . Anxiety   . Arthritis   . CHF (congestive heart failure) ([place])   . COPD (chronic obstructive pulmonary disease) ([place])   . Edema    feet/legs  . Hypertension    dr Otho Najjar     . Hypothyroidism   . Leg weakness, bilateral   . Osteoporosis   . Oxygen deficit    2l with bipap at night  . RLS (restless legs syndrome)   . Shortness of breath   . Sleep apnea    bipap  . Wheezing     Past Surgical History:  Procedure Laterality Date  . BACK SURGERY     cervical  . BREAST BIOPSY Left    needle bx-neg  . CATARACT EXTRACTION W/PHACO Left 05/07/2017   Procedure: CATARACT EXTRACTION PHACO AND INTRAOCULAR LENS PLACEMENT (IOC);  Surgeon: Birder Robson, MD;  Location: [facility];  Service: Ophthalmology;  Laterality: Left;  [place] 00:48.7 AP% 11.3 CDE 5.46 Fluid Pack Lot # [id number]  . CATARACT EXTRACTION W/PHACO Right 04/23/2017   Procedure: CATARACT EXTRACTION PHACO AND INTRAOCULAR LENS PLACEMENT ([place]);  Surgeon: Birder Robson, MD;  Location: [facility];  Service: Ophthalmology;  Laterality: Right;  [place] 00:29 AP% 12.8 CDE 3.79 FLUID PACK LOT # [id number]  . FOOT ARTHROPLASTY    . JOINT  REPLACEMENT     x2 tkr  . TOTAL SHOULDER ARTHROPLASTY Left 07/02/2013   Procedure: LEFT TOTAL SHOULDER ARTHROPLASTY;  Surgeon: Marin Shutter, MD;  Location: [place];  Service: Orthopedics;  Laterality: Left;  . TOTAL SHOULDER ARTHROPLASTY Right 04/10/2018   Procedure: TOTAL REVERSE SHOULDER ARTHROPLASTY;  Surgeon: Justice Britain, MD;  [facility];  Service: Orthopedics;  Laterality: Right;  195mn    There were no vitals filed for this visit.   Subjective Assessment - 12/24/19 1523    Subjective Pt reports being very fatigued and believes it was from having covid. Pain in R ant knee 3/10 via NPS.    Pertinent History extensive PMH including intensive care unit stays, rehab facility stays, previously ambulated with RW.. COPD, SOB, HTN. Denies CHF, reported she has been cleared by a cardiologist.    Limitations Standing;Walking;House hold activities    Patient Stated Goals improve balance, be able to get up from floor, use SPC    Currently in Pain? Yes    Pain Score 3     Pain Location Knee    Pain Orientation Right    Pain Descriptors / Indicators Aching    Pain Onset More than a month ago    Pain Frequency Intermittent  Scale of 0-10 for fatigue. 10/10 = extreme fatigue. Pt reports prior to PT she was a 3/10.    There.ex:    Nu-Step L1 for 10 min. UE/LE use. Cuing for maintaining 65 SPM for cardiopulmonary endurance. Post Nu-Step, 4/10 on fatigue scale.  Seated R knee extension: 3 lbs AW's, 3x10  Seated reciprocal marching: 3 lbs AW's, 2x12  Seated reciprocal hip flexion + abduction: 3 lbs AW's, 2x10. To improve ability to get in/out of the car. Use of 1/2 bolster on spin eto improve upright posture.  Walking marches in // bars with BUE support for hip flexor strength: x2. Verbal cues for elevating feet with good carryover after cues.    Frequent seated rest breaks due to fatigue.   PT Long Term Goals - 12/01/19 1533      PT LONG TERM GOAL #1   Title Pt. will  improve 5XSTS to <15 sec to improve functional mobility.    Baseline initial: 20.17s; 8/19: will test next session due to fatigue; 9/14: attempted assessment, discontinued due to acute R knee pain. Deferred to next session.    Time 4    Period Weeks    Status On-going    Target Date 12/29/19      PT LONG TERM GOAL #2   Title Pt. improve Berg balance score to >45/56 to be safe ambulating with SPC to improve independence.    Baseline Initial: 28/56; 8/19: will test next session due to fatigue. 9/14: 34/56    Time 4    Period Weeks    Status Partially Met    Target Date 12/29/19      PT LONG TERM GOAL #3   Title Pt. will demonstrate improved normal gait speed to 0.4 m/s to decrease falls risk and improve ability to safely walk in community.    Baseline initial: 0.265ms; 8/19: will test next session due to fatigue; 9/14: 0.38 m/s    Time 4    Period Weeks    Status Partially Met    Target Date 12/29/19                 Plan - 12/24/19 1526    Clinical Impression Statement Pt limited in ability to perform exercise today due to reports of fatigue. Difficulty maintaining 65 SPM on Nu-Step, requiring verbal cueing to maintain pace. Pt. completed seated and standing LE strengthening activities. Pt. able to complete multiple sets of exercises with mild cuieng for form. Pt. will continue to benefit from skilled PT to improve strength and balance to improve independence with ADLs and decrease falls risk.    Personal Factors and Comorbidities Age;Comorbidity 3+    Comorbidities CPD, HTN, Obesity    Stability/Clinical Decision Making Evolving/Moderate complexity    Clinical Decision Making Moderate    Rehab Potential Good    PT Frequency 2x / week    PT Duration 4 weeks    PT Treatment/Interventions Cryotherapy;Electrical Stimulation;Ultrasound;Moist Heat;Gait training;Functional mobility training;Therapeutic activities;Therapeutic exercise;Neuromuscular re-education;Scar  mobilization;Balance training;Stair training;Patient/family education;Manual techniques    PT Next Visit Plan Progress strengthening/ balance with least assistive device.    Consulted and Agree with Plan of Care Patient           Patient will benefit from skilled therapeutic intervention in order to improve the following deficits and impairments:  Decreased endurance, Decreased range of motion, Decreased skin integrity, Decreased strength, Hypomobility, Impaired UE functional use, Increased fascial restricitons, Pain, Postural dysfunction, Impaired flexibility, Decreased scar mobility, Decreased mobility, Decreased balance,  Improper body mechanics, Abnormal gait, Decreased activity tolerance, Impaired sensation  Visit Diagnosis: Imbalance  Muscle weakness (generalized)  Abnormal posture  Gait difficulty     Problem List Patient Active Problem List   Diagnosis Date Noted  . S/P reverse total shoulder arthroplasty, 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  . Chronic diastolic heart failure ([place]) 08/09/2016  . COPD (chronic obstructive pulmonary disease) ([place]) 08/09/2016  . Pulmonary infiltrates   . Pneumonia due to Streptococcus pneumoniae ([place])   . Fever   . Community acquired pneumonia of right lung   . Palliative care by specialist   . Goals of care, counseling/discussion   . Pressure injury of skin 07/03/2016  . COPD exacerbation ([place]) 07/21/2015  . Acute renal insufficiency 07/21/2015  . Hyperglycemia 07/21/2015  . Benign essential HTN 07/21/2015  . S/P shoulder replacement 07/02/2013   Pura Spice, PT, DPT # [id number] Carlyle Basques, SPT 12/24/2019, 5:32 PM  [facility] [address] [place], [name], [address] Phone: [phone]   Fax:  [phone]  Name: Hannah Powell MRN: [id number] Date of Birth: [birth year]

## 2019-12-29 ENCOUNTER — Other Ambulatory Visit: Payer: Self-pay

## 2019-12-29 ENCOUNTER — Encounter: Payer: Self-pay | Admitting: Physical Therapy

## 2019-12-29 ENCOUNTER — Ambulatory Visit: Payer: Medicare HMO

## 2019-12-29 DIAGNOSIS — R269 Unspecified abnormalities of gait and mobility: Secondary | ICD-10-CM

## 2019-12-29 DIAGNOSIS — M6281 Muscle weakness (generalized): Secondary | ICD-10-CM | POA: Diagnosis not present

## 2019-12-29 DIAGNOSIS — R293 Abnormal posture: Secondary | ICD-10-CM | POA: Diagnosis not present

## 2019-12-29 DIAGNOSIS — R2689 Other abnormalities of gait and mobility: Secondary | ICD-10-CM

## 2019-12-29 NOTE — Therapy (Signed)
[facility] [address] [place], [name], [address] Phone: [phone]   Fax:  [phone]  Physical Therapy Treatment and Recertification: [id number]   Patient Details  Name: Hannah Powell MRN: [id number] Date of Birth: November 29, 1945 Referring Provider (PT): Dr. Ramonita Lab III   Encounter Date: 12/29/2019   PT End of Session - 12/29/19 1509    Visit Number 19    Number of Visits 27    Date for PT Re-Evaluation 01/26/20    Authorization - Visit Number 1    Authorization - Number of Visits 10    PT Start Time 1500    PT Stop Time 1545    PT Time Calculation (min) 45 min    Equipment Utilized During Treatment Gait belt    Activity Tolerance Patient tolerated treatment well;Patient limited by fatigue    Behavior During Therapy [facility] for tasks assessed/performed           Past Medical History:  Diagnosis Date   Anxiety    Arthritis    CHF (congestive heart failure) ([facility])    COPD (chronic obstructive pulmonary disease) ([place])    Edema    feet/legs   Hypertension    dr Otho Najjar      Hypothyroidism    Leg weakness, bilateral    Osteoporosis    Oxygen deficit    2l with bipap at night   RLS (restless legs syndrome)    Shortness of breath    Sleep apnea    bipap   Wheezing     Past Surgical History:  Procedure Laterality Date   BACK SURGERY     cervical   BREAST BIOPSY Left    needle bx-neg   CATARACT EXTRACTION W/PHACO Left 05/07/2017   Procedure: CATARACT EXTRACTION PHACO AND INTRAOCULAR LENS PLACEMENT ([place]);  Surgeon: Birder Robson, MD;  Location: [facility];  Service: Ophthalmology;  Laterality: Left;  [place] 00:48.7 AP% 11.3 CDE 5.46 Fluid Pack Lot # [id number]   CATARACT EXTRACTION W/PHACO Right 04/23/2017   Procedure: CATARACT EXTRACTION PHACO AND INTRAOCULAR LENS PLACEMENT (IOC);  Surgeon: Birder Robson, MD;  Location: [facility];  Service: Ophthalmology;  Laterality: Right;  [place]  00:29 AP% 12.8 CDE 3.79 FLUID PACK LOT # [id number]   FOOT ARTHROPLASTY     JOINT REPLACEMENT     x2 tkr   TOTAL SHOULDER ARTHROPLASTY Left 07/02/2013   Procedure: LEFT TOTAL SHOULDER ARTHROPLASTY;  Surgeon: Marin Shutter, MD;  Location: [place];  Service: Orthopedics;  Laterality: Left;   TOTAL SHOULDER ARTHROPLASTY Right 04/10/2018   Procedure: TOTAL REVERSE SHOULDER ARTHROPLASTY;  Surgeon: Justice Britain, MD;  [facility];  Service: Orthopedics;  Laterality: Right;  135mn    There were no vitals filed for this visit.   Subjective Assessment - 12/29/19 1502    Subjective Pt. states her R knee is about a 4/10 NPS after going to the gym and completing leg ext.    Pertinent History extensive PMH including intensive care unit stays, rehab facility stays, previously ambulated with RW.. COPD, SOB, HTN. Denies CHF, reported she has been cleared by a cardiologist.    Limitations Standing;Walking;House hold activities    Patient Stated Goals improve balance, be able to get up from floor, use SPC    Currently in Pain? Yes    Pain Score 4     Pain Location Knee    Pain Orientation Right    Pain Onset More than a month ago  There Ex:  Nustep L3 10 mins (unbilled)  Gait speed reassessment: 41mt. Normal speed: 0.475m, fast speed: 0.48 m/s   5XSTS: pt able to complete in 20.2s with airex in chair.   Neuro Re-ed:  BeMerrilee Janskyalance scale: 42/56.   SLS tapping practice: pt unable to complete SLS tapping item on Berg, regressed to smaller step which she was still unable to complete, regressed to small standing march with no UE use. 10x      PT Long Term Goals - 12/29/19 1513      PT LONG TERM GOAL #1   Title Pt. will improve 5XSTS to <15 sec to improve functional mobility.    Baseline initial: 20.17s; 8/19: will test next session due to fatigue; 9/14: attempted assessment, discontinued due to acute R knee pain. Deferred to next session. 10/12: pt able to complete with  airex pad in chair: 20.2s    Time 4    Period Weeks    Status Partially Met    Target Date 01/26/20      PT LONG TERM GOAL #2   Title Pt. improve Berg balance score to >45/56 to be safe ambulating with SPC to improve independence.    Baseline Initial: 28/56; 8/19: will test next session due to fatigue. 9/14: 34/56. 10/12: 42/56    Time 4    Period Weeks    Status Partially Met    Target Date 01/26/20      PT LONG TERM GOAL #3   Title Pt. will demonstrate improved normal gait speed to 0.4 m/s to decrease falls risk and improve ability to safely walk in community.    Baseline initial: 0.27137m 8/19: will test next session due to fatigue; 9/14: 0.38 m/s; 10/12: 0.45 m/s    Time 4    Period Weeks    Status Achieved    Target Date 12/29/19      PT LONG TERM GOAL #4   Title Pt. will demonstrate safety with ascending 4 stairs with at most one handrail to improve community ambulation.    Baseline 10/12: pt able to ascend stairs with both handrails.    Time 4    Period Weeks    Status New    Target Date 01/26/20                 Plan - 12/29/19 1549    Clinical Impression Statement Goal reassesment today. Pt. has made progress towards all goals, new stair goal added. Pt. scored 42/56 on Berg Balance assessment, improved self selected gait speed to 0.80m24mand was able to complete 5xSTS with airex pad in chair in 20.2s. Pt. continues to demonstrate R knee pain with activities, but has generally been getting better. Pt. still experiencing moderate fatigue from post-covid symptoms. Pt. will continue to benefit from skilled PT to improve strength and balance to improve independence with ADLs and decrease falls risk.    Personal Factors and Comorbidities Age;Comorbidity 3+    Comorbidities CPD, HTN, Obesity    Stability/Clinical Decision Making Evolving/Moderate complexity    Clinical Decision Making Moderate    Rehab Potential Good    PT Frequency 2x / week    PT Duration 4 weeks     PT Treatment/Interventions Cryotherapy;Electrical Stimulation;Ultrasound;Moist Heat;Gait training;Functional mobility training;Therapeutic activities;Therapeutic exercise;Neuromuscular re-education;Scar mobilization;Balance training;Stair training;Patient/family education;Manual techniques    PT Next Visit Plan Progress strengthening/ balance with least assistive device.    Consulted and Agree with Plan of Care Patient  Patient will benefit from skilled therapeutic intervention in order to improve the following deficits and impairments:  Decreased endurance, Decreased range of motion, Decreased skin integrity, Decreased strength, Hypomobility, Impaired UE functional use, Increased fascial restricitons, Pain, Postural dysfunction, Impaired flexibility, Decreased scar mobility, Decreased mobility, Decreased balance, Improper body mechanics, Abnormal gait, Decreased activity tolerance, Impaired sensation  Visit Diagnosis: Imbalance  Muscle weakness (generalized)  Abnormal posture  Gait difficulty     Problem List Patient Active Problem List   Diagnosis Date Noted   S/P reverse total shoulder arthroplasty, 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   Chronic diastolic heart failure ([place]) 08/09/2016   COPD (chronic obstructive pulmonary disease) ([place]) 08/09/2016   Pulmonary infiltrates    Pneumonia due to Streptococcus pneumoniae ([place])    Fever    Community acquired pneumonia of right lung    Palliative care by specialist    Goals of care, counseling/discussion    Pressure injury of skin 07/03/2016   COPD exacerbation ([place]) 07/21/2015   Acute renal insufficiency 07/21/2015   Hyperglycemia 07/21/2015   Benign essential HTN 07/21/2015   S/P shoulder replacement 07/02/2013    Carlyle Basques, SPT 12/29/2019, 3:54 PM  [facility] [address]. [place], [name], [address] Phone: [phone]   Fax:  [phone]  Name: Aemilia Dedrick MRN: [id number] Date of Birth: 09-14-1945

## 2019-12-31 ENCOUNTER — Ambulatory Visit: Payer: Medicare HMO

## 2019-12-31 ENCOUNTER — Other Ambulatory Visit: Payer: Self-pay

## 2019-12-31 ENCOUNTER — Encounter: Payer: Self-pay | Admitting: Physical Therapy

## 2019-12-31 DIAGNOSIS — M6281 Muscle weakness (generalized): Secondary | ICD-10-CM

## 2019-12-31 DIAGNOSIS — R269 Unspecified abnormalities of gait and mobility: Secondary | ICD-10-CM

## 2019-12-31 DIAGNOSIS — R2689 Other abnormalities of gait and mobility: Secondary | ICD-10-CM | POA: Diagnosis not present

## 2019-12-31 DIAGNOSIS — R293 Abnormal posture: Secondary | ICD-10-CM | POA: Diagnosis not present

## 2019-12-31 NOTE — Therapy (Signed)
[facility] [address] [place], [name], [address] Phone: [phone]   Fax:  [phone]  Physical Therapy Treatment  Patient Details  Name: Hannah Powell MRN: [id number] Date of Birth: [birth year] Referring Provider (PT): Dr. Ramonita Lab III   Encounter Date: 12/31/2019   PT End of Session - 12/31/19 1530    Visit Number 20    Number of Visits 27    Date for PT Re-Evaluation 01/26/20    Authorization - Visit Number 2    Authorization - Number of Visits 10    PT Start Time 0938    PT Stop Time 1615    PT Time Calculation (min) 60 min    Equipment Utilized During Treatment Gait belt    Activity Tolerance Patient tolerated treatment well;Patient limited by fatigue    Behavior During Therapy [facility] for tasks assessed/performed           Past Medical History:  Diagnosis Date  . Anxiety   . Arthritis   . CHF (congestive heart failure) ([place])   . COPD (chronic obstructive pulmonary disease) ([place])   . Edema    feet/legs  . Hypertension    dr Otho Najjar     . Hypothyroidism   . Leg weakness, bilateral   . Osteoporosis   . Oxygen deficit    2l with bipap at night  . RLS (restless legs syndrome)   . Shortness of breath   . Sleep apnea    bipap  . Wheezing     Past Surgical History:  Procedure Laterality Date  . BACK SURGERY     cervical  . BREAST BIOPSY Left    needle bx-neg  . CATARACT EXTRACTION W/PHACO Left 05/07/2017   Procedure: CATARACT EXTRACTION PHACO AND INTRAOCULAR LENS PLACEMENT (IOC);  Surgeon: Birder Robson, MD;  Location: [facility];  Service: Ophthalmology;  Laterality: Left;  [place] 00:48.7 AP% 11.3 CDE 5.46 Fluid Pack Lot # [id number]  . CATARACT EXTRACTION W/PHACO Right 04/23/2017   Procedure: CATARACT EXTRACTION PHACO AND INTRAOCULAR LENS PLACEMENT ([place]);  Surgeon: Birder Robson, MD;  Location: [facility];  Service: Ophthalmology;  Laterality: Right;  [place] 00:29 AP% 12.8 CDE 3.79 FLUID PACK LOT #  [id number]  . FOOT ARTHROPLASTY    . JOINT REPLACEMENT     x2 tkr  . TOTAL SHOULDER ARTHROPLASTY Left 07/02/2013   Procedure: LEFT TOTAL SHOULDER ARTHROPLASTY;  Surgeon: Marin Shutter, MD;  Location: [facility];  Service: Orthopedics;  Laterality: Left;  . TOTAL SHOULDER ARTHROPLASTY Right 04/10/2018   Procedure: TOTAL REVERSE SHOULDER ARTHROPLASTY;  Surgeon: Justice Britain, MD;  Location: [facility];  Service: Orthopedics;  Laterality: Right;  192mn    There were no vitals filed for this visit.   Subjective Assessment - 12/31/19 1528    Subjective Pt. states that her R knee is 1/10 NPS. No falls and no near falls. Pt. is going to the beach next week.    Pertinent History extensive PMH including intensive care unit stays, rehab facility stays, previously ambulated with RW.. COPD, SOB, HTN. Denies CHF, reported she has been cleared by a cardiologist.    Limitations Standing;Walking;House hold activities    Patient Stated Goals improve balance, be able to get up from floor, use SPC    Currently in Pain? Yes    Pain Score 1     Pain Location Knee    Pain Orientation Right    Pain Descriptors / Indicators Aching    Pain Onset  More than a month ago            There Ex:  Nustep L4 10 mins, maintaining steps per minute above 65.   Education on getting into car that is higher up: pt practicing backwards step up onto 6" step, pt able to use walker in front and use railing behind. Pt educated on using step stool when getting in car to be able to complete. 5x  Single leg stance practice: in // bars, practicing weight shifting in standing with UE support and decreasing support. Pt weight-shifting back and forth to at most toe touch weight bearing on each foot. Pt able to complete with only one UE support on bar, unable to complete without any UE support. 15x each leg.   Standing hip abd: decreasing hand support throughout reps. Pt instructed to return double hand support on bar and added yellow  theraband around ankles. Pt able to complete multiple reps with moderate verbal cueing for upright posture. 20x total each leg.   Practice getting into car with footstool: pt demonstrated ability to get into truck by using footstool, stepping forward onto footstool and turning around. Pt demonstrated ability to complete transfer safely. 1x.   Pt educated on completing glute sets and seated hip abd against YTB at home.                               PT Long Term Goals - 12/29/19 1513      PT LONG TERM GOAL #1   Title Pt. will improve 5XSTS to <15 sec to improve functional mobility.    Baseline initial: 20.17s; 8/19: will test next session due to fatigue; 9/14: attempted assessment, discontinued due to acute R knee pain. Deferred to next session. 10/12: pt able to complete with airex pad in chair: 20.2s    Time 4    Period Weeks    Status Partially Met    Target Date 01/26/20      PT LONG TERM GOAL #2   Title Pt. improve Berg balance score to >45/56 to be safe ambulating with SPC to improve independence.    Baseline Initial: 28/56; 8/19: will test next session due to fatigue. 9/14: 34/56. 10/12: 42/56    Time 4    Period Weeks    Status Partially Met    Target Date 01/26/20      PT LONG TERM GOAL #3   Title Pt. will demonstrate improved normal gait speed to 0.4 m/s to decrease falls risk and improve ability to safely walk in community.    Baseline initial: 0.220m/s; 8/19: will test next session due to fatigue; 9/14: 0.38 m/s; 10/12: 0.45 m/s    Time 4    Period Weeks    Status Achieved    Target Date 12/29/19      PT LONG TERM GOAL #4   Title Pt. will demonstrate safety with ascending 4 stairs with at most one handrail to improve community ambulation.    Baseline 10/12: pt able to ascend stairs with both handrails.    Time 4    Period Weeks    Status New    Target Date 01/26/20                 Plan - 12/31/19 1626    Clinical Impression  Statement Pt. able to complete progressions to improve single leg stance ability. Pt. able to complete weightshifting to toe touch weightbearing  in // bars with decreasing upper extremity support. Pt. unable to have no UE suppport to complete at this time, but able to complete with one hand on bar. Pt. demonstrate trendelenburg sign bilat with toe touch weightbearing. Pt. completed standing hip abd to decrease trendelenburg. Pt. practiced getting into a car that is higher than her usual vehicle, utilizing a footstool to improve safety when completing transfer into car. Pt. will continue to benefit from skilled PT to improve strength and balance to improve independence with ADLs and decrease falls risk.    Personal Factors and Comorbidities Age;Comorbidity 3+    Comorbidities CPD, HTN, Obesity    Stability/Clinical Decision Making Evolving/Moderate complexity    Clinical Decision Making Moderate    Rehab Potential Good    PT Frequency 2x / week    PT Duration 4 weeks    PT Treatment/Interventions Cryotherapy;Electrical Stimulation;Ultrasound;Moist Heat;Gait training;Functional mobility training;Therapeutic activities;Therapeutic exercise;Neuromuscular re-education;Scar mobilization;Balance training;Stair training;Patient/family education;Manual techniques;ADLs/Self Care Home Management    PT Next Visit Plan Progress strengthening/ balance with least assistive device.    Consulted and Agree with Plan of Care Patient           Patient will benefit from skilled therapeutic intervention in order to improve the following deficits and impairments:  Decreased endurance, Decreased range of motion, Decreased skin integrity, Decreased strength, Hypomobility, Impaired UE functional use, Increased fascial restricitons, Pain, Postural dysfunction, Impaired flexibility, Decreased scar mobility, Decreased mobility, Decreased balance, Improper body mechanics, Abnormal gait, Decreased activity tolerance, Impaired  sensation  Visit Diagnosis: Imbalance  Muscle weakness (generalized)  Abnormal posture  Gait difficulty     Problem List Patient Active Problem List   Diagnosis Date Noted  . S/P reverse total shoulder arthroplasty, 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  . Chronic diastolic heart failure ([place]) 08/09/2016  . COPD (chronic obstructive pulmonary disease) ([place]) 08/09/2016  . Pulmonary infiltrates   . Pneumonia due to Streptococcus pneumoniae ([place])   . Fever   . Community acquired pneumonia of right lung   . Palliative care by specialist   . Goals of care, counseling/discussion   . Pressure injury of skin 07/03/2016  . COPD exacerbation ([place]) 07/21/2015  . Acute renal insufficiency 07/21/2015  . Hyperglycemia 07/21/2015  . Benign essential HTN 07/21/2015  . S/P shoulder replacement 07/02/2013    Carlyle Basques, SPT 12/31/2019, 4:29 PM  [facility] [address] [place], [name], [address] Phone: [phone]   Fax:  [phone]  Name: Hannah Powell MRN: [id number] Date of Birth: 19-Sep-1945

## 2020-01-05 ENCOUNTER — Ambulatory Visit: Payer: Medicare HMO | Admitting: Physical Therapy

## 2020-01-07 ENCOUNTER — Ambulatory Visit: Payer: Medicare HMO | Admitting: Physical Therapy

## 2020-01-07 DIAGNOSIS — J441 Chronic obstructive pulmonary disease with (acute) exacerbation: Secondary | ICD-10-CM | POA: Diagnosis not present

## 2020-01-07 DIAGNOSIS — Z96611 Presence of right artificial shoulder joint: Secondary | ICD-10-CM | POA: Diagnosis not present

## 2020-01-07 DIAGNOSIS — J9610 Chronic respiratory failure, unspecified whether with hypoxia or hypercapnia: Secondary | ICD-10-CM | POA: Diagnosis not present

## 2020-01-07 DIAGNOSIS — I5032 Chronic diastolic (congestive) heart failure: Secondary | ICD-10-CM | POA: Diagnosis not present

## 2020-01-07 DIAGNOSIS — J449 Chronic obstructive pulmonary disease, unspecified: Secondary | ICD-10-CM | POA: Diagnosis not present

## 2020-01-07 DIAGNOSIS — J9620 Acute and chronic respiratory failure, unspecified whether with hypoxia or hypercapnia: Secondary | ICD-10-CM | POA: Diagnosis not present

## 2020-01-07 DIAGNOSIS — J9601 Acute respiratory failure with hypoxia: Secondary | ICD-10-CM | POA: Diagnosis not present

## 2020-01-11 ENCOUNTER — Other Ambulatory Visit: Payer: Self-pay | Admitting: Internal Medicine

## 2020-01-12 ENCOUNTER — Encounter: Payer: Self-pay | Admitting: Physical Therapy

## 2020-01-12 ENCOUNTER — Other Ambulatory Visit: Payer: Self-pay

## 2020-01-12 ENCOUNTER — Ambulatory Visit: Payer: Medicare HMO

## 2020-01-12 DIAGNOSIS — R269 Unspecified abnormalities of gait and mobility: Secondary | ICD-10-CM

## 2020-01-12 DIAGNOSIS — M6281 Muscle weakness (generalized): Secondary | ICD-10-CM

## 2020-01-12 DIAGNOSIS — R293 Abnormal posture: Secondary | ICD-10-CM

## 2020-01-12 DIAGNOSIS — R2689 Other abnormalities of gait and mobility: Secondary | ICD-10-CM

## 2020-01-12 NOTE — Therapy (Signed)
[facility] [address] [place], [name], [address] Phone: [phone]   Fax:  [phone]  Patient Details  Name: Hannah Powell MRN: [id number] Date of Birth: Aug 09, 1945 Referring Provider:  Adin Hector, MD  Encounter Date: 01/12/2020  Pt arrived to therapy feeling very disconnected and discouraged about her progress physically. PT and pt had long conversation, PT provided emotional support, active listening and encouragement especially regarding progress made in therapy since initial evaluation. Pt left session stating that she felt much better than when she came in. Session unbilled due to no skilled therapy interventions completed.   Abby Kelliann Pendergraph 01/12/2020, 4:08 PM  [place] [facility] [address] [place], [name], [address] Phone: [phone]   Fax:  [phone]

## 2020-01-14 ENCOUNTER — Encounter: Payer: Self-pay | Admitting: Physical Therapy

## 2020-01-14 ENCOUNTER — Ambulatory Visit: Payer: Medicare HMO

## 2020-01-14 ENCOUNTER — Other Ambulatory Visit: Payer: Self-pay

## 2020-01-14 DIAGNOSIS — R269 Unspecified abnormalities of gait and mobility: Secondary | ICD-10-CM

## 2020-01-14 DIAGNOSIS — R293 Abnormal posture: Secondary | ICD-10-CM

## 2020-01-14 DIAGNOSIS — R2689 Other abnormalities of gait and mobility: Secondary | ICD-10-CM | POA: Diagnosis not present

## 2020-01-14 DIAGNOSIS — M6281 Muscle weakness (generalized): Secondary | ICD-10-CM | POA: Diagnosis not present

## 2020-01-14 NOTE — Therapy (Signed)
[facility] [address] [place], [name], [address] Phone: [phone]   Fax:  [phone]  Physical Therapy Treatment  Patient Details  Name: Hannah Powell MRN: [id number] Date of Birth: 1946-03-18 Referring Provider (PT): Dr. Ramonita Lab III   Encounter Date: 01/14/2020   PT End of Session - 01/14/20 1529    Visit Number 21    Number of Visits 27    Date for PT Re-Evaluation 01/26/20    Authorization - Visit Number 3    Authorization - Number of Visits 10    PT Start Time 1522    PT Stop Time 1600    PT Time Calculation (min) 38 min    Equipment Utilized During Treatment Gait belt    Activity Tolerance Patient tolerated treatment well;Patient limited by fatigue    Behavior During Therapy [facility] for tasks assessed/performed           Past Medical History:  Diagnosis Date  . Anxiety   . Arthritis   . CHF (congestive heart failure) ([place])   . COPD (chronic obstructive pulmonary disease) ([place])   . Edema    feet/legs  . Hypertension    dr Otho Najjar     . Hypothyroidism   . Leg weakness, bilateral   . Osteoporosis   . Oxygen deficit    2l with bipap at night  . RLS (restless legs syndrome)   . Shortness of breath   . Sleep apnea    bipap  . Wheezing     Past Surgical History:  Procedure Laterality Date  . BACK SURGERY     cervical  . BREAST BIOPSY Left    needle bx-neg  . CATARACT EXTRACTION W/PHACO Left 05/07/2017   Procedure: CATARACT EXTRACTION PHACO AND INTRAOCULAR LENS PLACEMENT (IOC);  Surgeon: Birder Robson, MD;  Location: [facility];  Service: Ophthalmology;  Laterality: Left;  [place] 00:48.7 AP% 11.3 CDE 5.46 Fluid Pack Lot # [id number]  . CATARACT EXTRACTION W/PHACO Right 04/23/2017   Procedure: CATARACT EXTRACTION PHACO AND INTRAOCULAR LENS PLACEMENT ([place]);  Surgeon: Birder Robson, MD;  Location: [facility];  Service: Ophthalmology;  Laterality: Right;  [place] 00:29 AP% 12.8 CDE 3.79 FLUID PACK LOT #  [id number] H  . FOOT ARTHROPLASTY    . JOINT REPLACEMENT     x2 tkr  . TOTAL SHOULDER ARTHROPLASTY Left 07/02/2013   Procedure: LEFT TOTAL SHOULDER ARTHROPLASTY;  Surgeon: Marin Shutter, MD;  Location: [facility];  Service: Orthopedics;  Laterality: Left;  . TOTAL SHOULDER ARTHROPLASTY Right 04/10/2018   Procedure: TOTAL REVERSE SHOULDER ARTHROPLASTY;  Surgeon: Justice Britain, MD;  [facility];  Service: Orthopedics;  Laterality: Right;  167mn    There were no vitals filed for this visit.   Subjective Assessment - 01/14/20 1528    Subjective Pt. states that R knee is 8/10 before pain patch. With pain patch on R knee, pt's knee is 5/10.    Pertinent History extensive PMH including intensive care unit stays, rehab facility stays, previously ambulated with RW.. COPD, SOB, HTN. Denies CHF, reported she has been cleared by a cardiologist.    Limitations Standing;Walking;House hold activities    Patient Stated Goals improve balance, be able to get up from floor, use SPC    Currently in Pain? Yes    Pain Score 5     Pain Location Knee    Pain Orientation Right    Pain Descriptors / Indicators Aching    Pain Onset More than a  month ago            There Ex:    Nustep L3 10 mins (unbilled)  Seated 4lb LAQ: 3x10. Cueing for decreased eccentric speed.  Seated 4lb marches: 3x10  Supine with red bolster under knees bridges: 3x10  Supine bilat hip abd with legs on red bolster: 3x10                             PT Long Term Goals - 12/29/19 1513      PT LONG TERM GOAL #1   Title Pt. will improve 5XSTS to <15 sec to improve functional mobility.    Baseline initial: 20.17s; 8/19: will test next session due to fatigue; 9/14: attempted assessment, discontinued due to acute R knee pain. Deferred to next session. 10/12: pt able to complete with airex pad in chair: 20.2s    Time 4    Period Weeks    Status Partially Met    Target Date 01/26/20      PT LONG TERM GOAL #2    Title Pt. improve Berg balance score to >45/56 to be safe ambulating with SPC to improve independence.    Baseline Initial: 28/56; 8/19: will test next session due to fatigue. 9/14: 34/56. 10/12: 42/56    Time 4    Period Weeks    Status Partially Met    Target Date 01/26/20      PT LONG TERM GOAL #3   Title Pt. will demonstrate improved normal gait speed to 0.4 m/s to decrease falls risk and improve ability to safely walk in community.    Baseline initial: 0.24ms; 8/19: will test next session due to fatigue; 9/14: 0.38 m/s; 10/12: 0.45 m/s    Time 4    Period Weeks    Status Achieved    Target Date 12/29/19      PT LONG TERM GOAL #4   Title Pt. will demonstrate safety with ascending 4 stairs with at most one handrail to improve community ambulation.    Baseline 10/12: pt able to ascend stairs with both handrails.    Time 4    Period Weeks    Status New    Target Date 01/26/20                 Plan - 01/14/20 1607    Clinical Impression Statement Pt. returns to therapy with pain staying in R knee, 5/10 with pain patch on. Pt. is returning to MD on Monday to have R knee assessed. Pt. would like to put therapy on hold for about a month to figure out her R knee. Pt. responded well to seated and supine LE therex during today's session. Pt. instructed to keep clinic updated on when she wants to proceed with therapy. Pt. will continue to benefit from skilled PT to improve balance and gait in the future to improve ability to complete ADLs and decrease falls risk.    Personal Factors and Comorbidities Age;Comorbidity 3+    Comorbidities CPD, HTN, Obesity    Stability/Clinical Decision Making Evolving/Moderate complexity    Clinical Decision Making Moderate    Rehab Potential Good    PT Frequency 2x / week    PT Duration 4 weeks    PT Treatment/Interventions Cryotherapy;Electrical Stimulation;Ultrasound;Moist Heat;Gait training;Functional mobility training;Therapeutic  activities;Therapeutic exercise;Neuromuscular re-education;Scar mobilization;Balance training;Stair training;Patient/family education;Manual techniques;ADLs/Self Care Home Management    PT Next Visit Plan Progress strengthening/ balance with least assistive device.  Consulted and Agree with Plan of Care Patient           Patient will benefit from skilled therapeutic intervention in order to improve the following deficits and impairments:  Decreased endurance, Decreased range of motion, Decreased skin integrity, Decreased strength, Hypomobility, Impaired UE functional use, Increased fascial restricitons, Pain, Postural dysfunction, Impaired flexibility, Decreased scar mobility, Decreased mobility, Decreased balance, Improper body mechanics, Abnormal gait, Decreased activity tolerance, Impaired sensation  Visit Diagnosis: Imbalance  Muscle weakness (generalized)  Abnormal posture  Gait difficulty     Problem List Patient Active Problem List   Diagnosis Date Noted  . S/P reverse total shoulder arthroplasty, 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  . Chronic diastolic heart failure ([place]) 08/09/2016  . COPD (chronic obstructive pulmonary disease) ([place]) 08/09/2016  . Pulmonary infiltrates   . Pneumonia due to Streptococcus pneumoniae ([place])   . Fever   . Community acquired pneumonia of right lung   . Palliative care by specialist   . Goals of care, counseling/discussion   . Pressure injury of skin 07/03/2016  . COPD exacerbation ([place]) 07/21/2015  . Acute renal insufficiency 07/21/2015  . Hyperglycemia 07/21/2015  . Benign essential HTN 07/21/2015  . S/P shoulder replacement 07/02/2013    Carlyle Basques, SPT 01/14/2020, 4:10 PM  [place] [facility] [address] [place], [name], [address] Phone: [phone]   Fax:  [phone]  Name: Hannah Powell MRN: [id number] Date of Birth: 10-06-1945

## 2020-01-19 DIAGNOSIS — Z96651 Presence of right artificial knee joint: Secondary | ICD-10-CM | POA: Diagnosis not present

## 2020-01-19 DIAGNOSIS — M7051 Other bursitis of knee, right knee: Secondary | ICD-10-CM | POA: Diagnosis not present

## 2020-01-19 DIAGNOSIS — M7651 Patellar tendinitis, right knee: Secondary | ICD-10-CM | POA: Diagnosis not present

## 2020-01-21 DIAGNOSIS — I5032 Chronic diastolic (congestive) heart failure: Secondary | ICD-10-CM | POA: Diagnosis not present

## 2020-01-21 DIAGNOSIS — Z8679 Personal history of other diseases of the circulatory system: Secondary | ICD-10-CM | POA: Diagnosis not present

## 2020-01-26 DIAGNOSIS — I10 Essential (primary) hypertension: Secondary | ICD-10-CM | POA: Diagnosis not present

## 2020-01-26 DIAGNOSIS — E7800 Pure hypercholesterolemia, unspecified: Secondary | ICD-10-CM | POA: Diagnosis not present

## 2020-01-26 DIAGNOSIS — J9611 Chronic respiratory failure with hypoxia: Secondary | ICD-10-CM | POA: Diagnosis not present

## 2020-01-26 DIAGNOSIS — Z79891 Long term (current) use of opiate analgesic: Secondary | ICD-10-CM | POA: Diagnosis not present

## 2020-01-26 DIAGNOSIS — J438 Other emphysema: Secondary | ICD-10-CM | POA: Diagnosis not present

## 2020-01-29 DIAGNOSIS — M7701 Medial epicondylitis, right elbow: Secondary | ICD-10-CM | POA: Diagnosis not present

## 2020-01-29 DIAGNOSIS — M25521 Pain in right elbow: Secondary | ICD-10-CM | POA: Diagnosis not present

## 2020-01-29 DIAGNOSIS — M778 Other enthesopathies, not elsewhere classified: Secondary | ICD-10-CM | POA: Diagnosis not present

## 2020-02-02 DIAGNOSIS — G63 Polyneuropathy in diseases classified elsewhere: Secondary | ICD-10-CM | POA: Diagnosis not present

## 2020-02-02 DIAGNOSIS — N183 Chronic kidney disease, stage 3 unspecified: Secondary | ICD-10-CM | POA: Diagnosis not present

## 2020-02-02 DIAGNOSIS — J9611 Chronic respiratory failure with hypoxia: Secondary | ICD-10-CM | POA: Diagnosis not present

## 2020-02-02 DIAGNOSIS — J449 Chronic obstructive pulmonary disease, unspecified: Secondary | ICD-10-CM | POA: Diagnosis not present

## 2020-02-02 DIAGNOSIS — M48061 Spinal stenosis, lumbar region without neurogenic claudication: Secondary | ICD-10-CM | POA: Diagnosis not present

## 2020-02-02 DIAGNOSIS — I130 Hypertensive heart and chronic kidney disease with heart failure and stage 1 through stage 4 chronic kidney disease, or unspecified chronic kidney disease: Secondary | ICD-10-CM | POA: Diagnosis not present

## 2020-02-02 DIAGNOSIS — I509 Heart failure, unspecified: Secondary | ICD-10-CM | POA: Diagnosis not present

## 2020-02-02 DIAGNOSIS — E039 Hypothyroidism, unspecified: Secondary | ICD-10-CM | POA: Diagnosis not present

## 2020-02-05 DIAGNOSIS — M7701 Medial epicondylitis, right elbow: Secondary | ICD-10-CM | POA: Diagnosis not present

## 2020-02-07 DIAGNOSIS — J441 Chronic obstructive pulmonary disease with (acute) exacerbation: Secondary | ICD-10-CM | POA: Diagnosis not present

## 2020-02-07 DIAGNOSIS — I5032 Chronic diastolic (congestive) heart failure: Secondary | ICD-10-CM | POA: Diagnosis not present

## 2020-02-07 DIAGNOSIS — J9620 Acute and chronic respiratory failure, unspecified whether with hypoxia or hypercapnia: Secondary | ICD-10-CM | POA: Diagnosis not present

## 2020-02-07 DIAGNOSIS — J9601 Acute respiratory failure with hypoxia: Secondary | ICD-10-CM | POA: Diagnosis not present

## 2020-02-07 DIAGNOSIS — Z96611 Presence of right artificial shoulder joint: Secondary | ICD-10-CM | POA: Diagnosis not present

## 2020-02-07 DIAGNOSIS — J9610 Chronic respiratory failure, unspecified whether with hypoxia or hypercapnia: Secondary | ICD-10-CM | POA: Diagnosis not present

## 2020-02-07 DIAGNOSIS — J449 Chronic obstructive pulmonary disease, unspecified: Secondary | ICD-10-CM | POA: Diagnosis not present

## 2020-03-08 DIAGNOSIS — J449 Chronic obstructive pulmonary disease, unspecified: Secondary | ICD-10-CM | POA: Diagnosis not present

## 2020-03-08 DIAGNOSIS — Z96611 Presence of right artificial shoulder joint: Secondary | ICD-10-CM | POA: Diagnosis not present

## 2020-03-08 DIAGNOSIS — I5032 Chronic diastolic (congestive) heart failure: Secondary | ICD-10-CM | POA: Diagnosis not present

## 2020-03-08 DIAGNOSIS — I10 Essential (primary) hypertension: Secondary | ICD-10-CM | POA: Diagnosis not present

## 2020-03-08 DIAGNOSIS — E034 Atrophy of thyroid (acquired): Secondary | ICD-10-CM | POA: Diagnosis not present

## 2020-03-08 DIAGNOSIS — J441 Chronic obstructive pulmonary disease with (acute) exacerbation: Secondary | ICD-10-CM | POA: Diagnosis not present

## 2020-03-08 DIAGNOSIS — J9601 Acute respiratory failure with hypoxia: Secondary | ICD-10-CM | POA: Diagnosis not present

## 2020-03-08 DIAGNOSIS — J9610 Chronic respiratory failure, unspecified whether with hypoxia or hypercapnia: Secondary | ICD-10-CM | POA: Diagnosis not present

## 2020-03-08 DIAGNOSIS — J9620 Acute and chronic respiratory failure, unspecified whether with hypoxia or hypercapnia: Secondary | ICD-10-CM | POA: Diagnosis not present

## 2020-03-23 DIAGNOSIS — Z20822 Contact with and (suspected) exposure to covid-19: Secondary | ICD-10-CM | POA: Diagnosis not present

## 2020-03-23 DIAGNOSIS — I5032 Chronic diastolic (congestive) heart failure: Secondary | ICD-10-CM | POA: Diagnosis not present

## 2020-03-23 DIAGNOSIS — J069 Acute upper respiratory infection, unspecified: Secondary | ICD-10-CM | POA: Diagnosis not present

## 2020-03-28 DIAGNOSIS — U071 COVID-19: Secondary | ICD-10-CM | POA: Diagnosis not present

## 2020-03-28 DIAGNOSIS — I5032 Chronic diastolic (congestive) heart failure: Secondary | ICD-10-CM | POA: Diagnosis not present

## 2020-03-28 DIAGNOSIS — J208 Acute bronchitis due to other specified organisms: Secondary | ICD-10-CM | POA: Diagnosis not present

## 2020-03-29 DIAGNOSIS — J9610 Chronic respiratory failure, unspecified whether with hypoxia or hypercapnia: Secondary | ICD-10-CM | POA: Diagnosis not present

## 2020-03-29 DIAGNOSIS — J441 Chronic obstructive pulmonary disease with (acute) exacerbation: Secondary | ICD-10-CM | POA: Diagnosis not present

## 2020-03-29 DIAGNOSIS — G4733 Obstructive sleep apnea (adult) (pediatric): Secondary | ICD-10-CM | POA: Diagnosis not present

## 2020-03-29 DIAGNOSIS — J449 Chronic obstructive pulmonary disease, unspecified: Secondary | ICD-10-CM | POA: Diagnosis not present

## 2020-04-05 DIAGNOSIS — I5032 Chronic diastolic (congestive) heart failure: Secondary | ICD-10-CM | POA: Diagnosis not present

## 2020-04-05 DIAGNOSIS — J438 Other emphysema: Secondary | ICD-10-CM | POA: Diagnosis not present

## 2020-04-05 DIAGNOSIS — M25532 Pain in left wrist: Secondary | ICD-10-CM | POA: Diagnosis not present

## 2020-04-05 DIAGNOSIS — N1831 Chronic kidney disease, stage 3a: Secondary | ICD-10-CM | POA: Diagnosis not present

## 2020-04-05 DIAGNOSIS — J9611 Chronic respiratory failure with hypoxia: Secondary | ICD-10-CM | POA: Diagnosis not present

## 2020-04-05 DIAGNOSIS — M7989 Other specified soft tissue disorders: Secondary | ICD-10-CM | POA: Diagnosis not present

## 2020-04-05 DIAGNOSIS — G63 Polyneuropathy in diseases classified elsewhere: Secondary | ICD-10-CM | POA: Diagnosis not present

## 2020-04-05 DIAGNOSIS — Z6835 Body mass index (BMI) 35.0-35.9, adult: Secondary | ICD-10-CM | POA: Diagnosis not present

## 2020-04-08 DIAGNOSIS — I5032 Chronic diastolic (congestive) heart failure: Secondary | ICD-10-CM | POA: Diagnosis not present

## 2020-04-08 DIAGNOSIS — Z96611 Presence of right artificial shoulder joint: Secondary | ICD-10-CM | POA: Diagnosis not present

## 2020-04-08 DIAGNOSIS — J441 Chronic obstructive pulmonary disease with (acute) exacerbation: Secondary | ICD-10-CM | POA: Diagnosis not present

## 2020-04-08 DIAGNOSIS — J9620 Acute and chronic respiratory failure, unspecified whether with hypoxia or hypercapnia: Secondary | ICD-10-CM | POA: Diagnosis not present

## 2020-04-08 DIAGNOSIS — J449 Chronic obstructive pulmonary disease, unspecified: Secondary | ICD-10-CM | POA: Diagnosis not present

## 2020-04-08 DIAGNOSIS — J9601 Acute respiratory failure with hypoxia: Secondary | ICD-10-CM | POA: Diagnosis not present

## 2020-04-08 DIAGNOSIS — J9610 Chronic respiratory failure, unspecified whether with hypoxia or hypercapnia: Secondary | ICD-10-CM | POA: Diagnosis not present

## 2020-04-19 DIAGNOSIS — I517 Cardiomegaly: Secondary | ICD-10-CM | POA: Diagnosis not present

## 2020-04-19 DIAGNOSIS — J811 Chronic pulmonary edema: Secondary | ICD-10-CM | POA: Diagnosis not present

## 2020-04-19 DIAGNOSIS — I5032 Chronic diastolic (congestive) heart failure: Secondary | ICD-10-CM | POA: Diagnosis not present

## 2020-04-19 DIAGNOSIS — J9811 Atelectasis: Secondary | ICD-10-CM | POA: Diagnosis not present

## 2020-04-19 DIAGNOSIS — R0600 Dyspnea, unspecified: Secondary | ICD-10-CM | POA: Diagnosis not present

## 2020-04-19 DIAGNOSIS — J90 Pleural effusion, not elsewhere classified: Secondary | ICD-10-CM | POA: Diagnosis not present

## 2020-04-19 DIAGNOSIS — J9611 Chronic respiratory failure with hypoxia: Secondary | ICD-10-CM | POA: Diagnosis not present

## 2020-04-19 DIAGNOSIS — J438 Other emphysema: Secondary | ICD-10-CM | POA: Diagnosis not present

## 2020-04-25 ENCOUNTER — Ambulatory Visit (INDEPENDENT_AMBULATORY_CARE_PROVIDER_SITE_OTHER): Payer: Medicare HMO | Admitting: Dermatology

## 2020-04-25 ENCOUNTER — Encounter: Payer: Self-pay | Admitting: Dermatology

## 2020-04-25 ENCOUNTER — Other Ambulatory Visit: Payer: Self-pay

## 2020-04-25 DIAGNOSIS — I872 Venous insufficiency (chronic) (peripheral): Secondary | ICD-10-CM

## 2020-04-25 DIAGNOSIS — L304 Erythema intertrigo: Secondary | ICD-10-CM

## 2020-04-25 DIAGNOSIS — L578 Other skin changes due to chronic exposure to nonionizing radiation: Secondary | ICD-10-CM | POA: Diagnosis not present

## 2020-04-25 DIAGNOSIS — D229 Melanocytic nevi, unspecified: Secondary | ICD-10-CM

## 2020-04-25 DIAGNOSIS — L814 Other melanin hyperpigmentation: Secondary | ICD-10-CM | POA: Diagnosis not present

## 2020-04-25 DIAGNOSIS — Z86018 Personal history of other benign neoplasm: Secondary | ICD-10-CM

## 2020-04-25 DIAGNOSIS — Z1283 Encounter for screening for malignant neoplasm of skin: Secondary | ICD-10-CM

## 2020-04-25 DIAGNOSIS — D692 Other nonthrombocytopenic purpura: Secondary | ICD-10-CM

## 2020-04-25 DIAGNOSIS — L821 Other seborrheic keratosis: Secondary | ICD-10-CM

## 2020-04-25 DIAGNOSIS — D1800 Hemangioma unspecified site: Secondary | ICD-10-CM

## 2020-04-25 DIAGNOSIS — L853 Xerosis cutis: Secondary | ICD-10-CM

## 2020-04-25 MED ORDER — KETOCONAZOLE 2 % EX CREA: TOPICAL_CREAM | CUTANEOUS | 1 refills | Status: AC

## 2020-04-25 NOTE — Patient Instructions (Signed)
Dry Skin Care  What causes dry skin?  Dry skin is common and results from inadequate moisture in the outer skin layers. Dry skin usually results from the excessive loss of moisture from the skin surface. This occurs due to two major factors: 1. Normally the skin's oil glands deposit a layer of oil on the skin's surface. This layer of oil prevents the loss of moisture from the skin. Exposure to soaps, cleaners, solvents, and disinfectants removes this oily film, allowing water to escape. 2. Water loss from the skin increases when the humidity is low. During winter months we spend a lot of time indoors where the air is heated. Heated air has very low humidity. This also contributes to dry skin.  A tendency for dry skin may accompany such disorders as eczema. Also, as people age, the number of functioning oil glands decreases, and the tendency toward dry skin can be a sensation of skin tightness when emerging from the shower.  How do I manage dry skin?  1. Humidify your environment. This can be accomplished by using a humidifier in your bedroom at night during winter months. 2. Bathing can actually put moisture back into your skin if done right. Take the following steps while bathing to sooth dry skin:  Avoid hot water, which only dries the skin and makes itching worse. Use warm water.  Avoid washcloths or extensive rubbing or scrubbing.  Use mild soaps like unscented Dove, Oil of Olay, Cetaphil, Basis, or CeraVe.  If you take baths rather than showers, rinse off soap residue with clean water before getting out of tub.  Once out of the shower/tub, pat dry gently with a soft towel. Leave your skin damp.  While still damp, apply any medicated ointment/cream you were prescribed to the affected areas. After you apply your medicated ointment/cream, then apply your moisturizer to your whole body.This is the most important step in dry skin care. If this is omitted, your skin will continue to be  dry.  The choice of moisturizer is also very important. In general, lotion will not provider enough moisture to severely dry skin because it is water based. You should use an ointment or cream. Moisturizers should also be unscented. Good choices include Vaseline (plain petrolatum), Aquaphor, Cetaphil, CeraVe, Vanicream, DML Forte, Aveeno moisture, or Eucerin Cream.  Bath oils can be helpful, but do not replace the application of moisturizer after the bath. In addition, they make the tub slippery causing an increased risk for falls. Therefore, we do not recommend their use. 

## 2020-04-25 NOTE — Progress Notes (Signed)
   Follow-Up Visit   Subjective  Hannah Powell is a 75 y.o. female who presents for the following: Annual Exam (Hx dysplastic nevi ). The patient presents for Total-Body Skin Exam (TBSE) for skin cancer screening and mole check.  The following portions of the chart were reviewed this encounter and updated as appropriate:   Tobacco  Allergies  Meds  Problems  Med Hx  Surg Hx  Fam Hx     Review of Systems:  No other skin or systemic complaints except as noted in HPI or Assessment and Plan.  Objective  Well appearing patient in no apparent distress; mood and affect are within normal limits.  A full examination was performed including scalp, head, eyes, ears, nose, lips, neck, chest, axillae, abdomen, back, buttocks, bilateral upper extremities, bilateral lower extremities, hands, feet, fingers, toes, fingernails, and toenails. All findings within normal limits unless otherwise noted below.  Objective  Right Inframammary Fold, L axilla: Erythema R inframammary   Objective  B/L lower legs: Hyperpigmentation with edema  Assessment & Plan  Erythema intertrigo Right Inframammary Fold, L axilla  Intertrigo is a chronic recurrent rash that occurs in skin fold areas that may be associated with friction; heat; moisture; yeast; fungus; and bacteria.  It is exacerbated by increased movement / activity; sweating; and higher atmospheric temperature.  Start Ketoconazole 2% cream to aa's QD  ketoconazole (NIZORAL) 2 % cream - Right Inframammary Fold, L axilla  Stasis dermatitis of both legs B/L lower legs  Continue compression stockings daily.  Skin cancer screening   Lentigines - Scattered tan macules - Discussed due to sun exposure - Benign, observe - Call for any changes  Seborrheic Keratoses - Stuck-on, waxy, tan-brown papules and plaques  - Discussed benign etiology and prognosis. - Observe - Call for any changes  Melanocytic Nevi - Tan-brown and/or  pink-flesh-colored symmetric macules and papules - Benign appearing on exam today - Observation - Call clinic for new or changing moles - Recommend daily use of broad spectrum spf 30+ sunscreen to sun-exposed areas.   Hemangiomas - Red papules - Discussed benign nature - Observe - Call for any changes  Actinic Damage - Chronic, secondary to cumulative UV/sun exposure - diffuse scaly erythematous macules with underlying dyspigmentation - Recommend daily broad spectrum sunscreen SPF 30+ to sun-exposed areas, reapply every 2 hours as needed.  - Call for new or changing lesions.  History of Dysplastic Nevi - No evidence of recurrence today - Recommend regular full body skin exams - Recommend daily broad spectrum sunscreen SPF 30+ to sun-exposed areas, reapply every 2 hours as needed.  - Call if any new or changing lesions are noted between office visits  Xerosis - diffuse xerotic patches - recommend gentle, hydrating skin care - gentle skin care handout given  Purpura - Chronic; persistent and recurrent.  Treatable, but not curable. - Violaceous macules and patches - Benign - Related to trauma, age, sun damage and/or use of blood thinners, chronic use of topical and/or oral steroids - Observe - Can use OTC arnica containing moisturizer such as Dermend Bruise Formula if desired - Call for worsening or other concerns  Skin cancer screening performed today.  Return in about 1 year (around 04/25/2021) for TBSE - hx dysplastic nevi .  Luther Redo, CMA, am acting as scribe for Sarina Ser, MD .  Documentation: I have reviewed the above documentation for accuracy and completeness, and I agree with the above.  Sarina Ser, MD

## 2020-04-27 DIAGNOSIS — Z79899 Other long term (current) drug therapy: Secondary | ICD-10-CM | POA: Diagnosis not present

## 2020-04-27 DIAGNOSIS — E034 Atrophy of thyroid (acquired): Secondary | ICD-10-CM | POA: Diagnosis not present

## 2020-04-27 DIAGNOSIS — E7800 Pure hypercholesterolemia, unspecified: Secondary | ICD-10-CM | POA: Diagnosis not present

## 2020-04-27 DIAGNOSIS — I10 Essential (primary) hypertension: Secondary | ICD-10-CM | POA: Diagnosis not present

## 2020-04-28 ENCOUNTER — Encounter: Payer: Self-pay | Admitting: Dermatology

## 2020-05-03 DIAGNOSIS — R200 Anesthesia of skin: Secondary | ICD-10-CM | POA: Diagnosis not present

## 2020-05-03 DIAGNOSIS — Z6835 Body mass index (BMI) 35.0-35.9, adult: Secondary | ICD-10-CM | POA: Diagnosis not present

## 2020-05-03 DIAGNOSIS — M4316 Spondylolisthesis, lumbar region: Secondary | ICD-10-CM | POA: Diagnosis not present

## 2020-05-03 DIAGNOSIS — E034 Atrophy of thyroid (acquired): Secondary | ICD-10-CM | POA: Diagnosis not present

## 2020-05-03 DIAGNOSIS — I5032 Chronic diastolic (congestive) heart failure: Secondary | ICD-10-CM | POA: Diagnosis not present

## 2020-05-03 DIAGNOSIS — I517 Cardiomegaly: Secondary | ICD-10-CM | POA: Diagnosis not present

## 2020-05-03 DIAGNOSIS — M47816 Spondylosis without myelopathy or radiculopathy, lumbar region: Secondary | ICD-10-CM | POA: Diagnosis not present

## 2020-05-03 DIAGNOSIS — I509 Heart failure, unspecified: Secondary | ICD-10-CM | POA: Diagnosis not present

## 2020-05-03 DIAGNOSIS — J9811 Atelectasis: Secondary | ICD-10-CM | POA: Diagnosis not present

## 2020-05-03 DIAGNOSIS — Z981 Arthrodesis status: Secondary | ICD-10-CM | POA: Diagnosis not present

## 2020-05-03 DIAGNOSIS — J438 Other emphysema: Secondary | ICD-10-CM | POA: Diagnosis not present

## 2020-05-03 DIAGNOSIS — J811 Chronic pulmonary edema: Secondary | ICD-10-CM | POA: Diagnosis not present

## 2020-05-03 DIAGNOSIS — J9611 Chronic respiratory failure with hypoxia: Secondary | ICD-10-CM | POA: Diagnosis not present

## 2020-05-03 DIAGNOSIS — I130 Hypertensive heart and chronic kidney disease with heart failure and stage 1 through stage 4 chronic kidney disease, or unspecified chronic kidney disease: Secondary | ICD-10-CM | POA: Diagnosis not present

## 2020-05-03 DIAGNOSIS — G63 Polyneuropathy in diseases classified elsewhere: Secondary | ICD-10-CM | POA: Diagnosis not present

## 2020-05-03 DIAGNOSIS — N1831 Chronic kidney disease, stage 3a: Secondary | ICD-10-CM | POA: Diagnosis not present

## 2020-05-03 DIAGNOSIS — R531 Weakness: Secondary | ICD-10-CM | POA: Diagnosis not present

## 2020-05-09 DIAGNOSIS — J441 Chronic obstructive pulmonary disease with (acute) exacerbation: Secondary | ICD-10-CM | POA: Diagnosis not present

## 2020-05-09 DIAGNOSIS — J9610 Chronic respiratory failure, unspecified whether with hypoxia or hypercapnia: Secondary | ICD-10-CM | POA: Diagnosis not present

## 2020-05-09 DIAGNOSIS — Z96611 Presence of right artificial shoulder joint: Secondary | ICD-10-CM | POA: Diagnosis not present

## 2020-05-09 DIAGNOSIS — J449 Chronic obstructive pulmonary disease, unspecified: Secondary | ICD-10-CM | POA: Diagnosis not present

## 2020-05-09 DIAGNOSIS — I5032 Chronic diastolic (congestive) heart failure: Secondary | ICD-10-CM | POA: Diagnosis not present

## 2020-05-09 DIAGNOSIS — J9620 Acute and chronic respiratory failure, unspecified whether with hypoxia or hypercapnia: Secondary | ICD-10-CM | POA: Diagnosis not present

## 2020-05-09 DIAGNOSIS — J9601 Acute respiratory failure with hypoxia: Secondary | ICD-10-CM | POA: Diagnosis not present

## 2020-05-30 DIAGNOSIS — R29898 Other symptoms and signs involving the musculoskeletal system: Secondary | ICD-10-CM | POA: Diagnosis not present

## 2020-05-30 DIAGNOSIS — M5136 Other intervertebral disc degeneration, lumbar region: Secondary | ICD-10-CM | POA: Diagnosis not present

## 2020-05-30 DIAGNOSIS — I10 Essential (primary) hypertension: Secondary | ICD-10-CM | POA: Diagnosis not present

## 2020-05-30 DIAGNOSIS — M545 Low back pain, unspecified: Secondary | ICD-10-CM | POA: Diagnosis not present

## 2020-06-02 ENCOUNTER — Other Ambulatory Visit: Payer: Self-pay | Admitting: Neurosurgery

## 2020-06-02 DIAGNOSIS — M13832 Other specified arthritis, left wrist: Secondary | ICD-10-CM | POA: Diagnosis not present

## 2020-06-02 DIAGNOSIS — R29898 Other symptoms and signs involving the musculoskeletal system: Secondary | ICD-10-CM

## 2020-06-06 ENCOUNTER — Other Ambulatory Visit: Payer: Self-pay | Admitting: Neurosurgery

## 2020-06-06 DIAGNOSIS — J9620 Acute and chronic respiratory failure, unspecified whether with hypoxia or hypercapnia: Secondary | ICD-10-CM | POA: Diagnosis not present

## 2020-06-06 DIAGNOSIS — J9610 Chronic respiratory failure, unspecified whether with hypoxia or hypercapnia: Secondary | ICD-10-CM | POA: Diagnosis not present

## 2020-06-06 DIAGNOSIS — J449 Chronic obstructive pulmonary disease, unspecified: Secondary | ICD-10-CM | POA: Diagnosis not present

## 2020-06-06 DIAGNOSIS — J9601 Acute respiratory failure with hypoxia: Secondary | ICD-10-CM | POA: Diagnosis not present

## 2020-06-06 DIAGNOSIS — J441 Chronic obstructive pulmonary disease with (acute) exacerbation: Secondary | ICD-10-CM | POA: Diagnosis not present

## 2020-06-06 DIAGNOSIS — I5032 Chronic diastolic (congestive) heart failure: Secondary | ICD-10-CM | POA: Diagnosis not present

## 2020-06-06 DIAGNOSIS — R29898 Other symptoms and signs involving the musculoskeletal system: Secondary | ICD-10-CM

## 2020-06-06 DIAGNOSIS — Z96611 Presence of right artificial shoulder joint: Secondary | ICD-10-CM | POA: Diagnosis not present

## 2020-06-07 DIAGNOSIS — J9611 Chronic respiratory failure with hypoxia: Secondary | ICD-10-CM | POA: Diagnosis not present

## 2020-06-07 DIAGNOSIS — E034 Atrophy of thyroid (acquired): Secondary | ICD-10-CM | POA: Diagnosis not present

## 2020-06-07 DIAGNOSIS — I130 Hypertensive heart and chronic kidney disease with heart failure and stage 1 through stage 4 chronic kidney disease, or unspecified chronic kidney disease: Secondary | ICD-10-CM | POA: Diagnosis not present

## 2020-06-07 DIAGNOSIS — E876 Hypokalemia: Secondary | ICD-10-CM | POA: Diagnosis not present

## 2020-06-07 DIAGNOSIS — Z23 Encounter for immunization: Secondary | ICD-10-CM | POA: Diagnosis not present

## 2020-06-07 DIAGNOSIS — Z6835 Body mass index (BMI) 35.0-35.9, adult: Secondary | ICD-10-CM | POA: Diagnosis not present

## 2020-06-07 DIAGNOSIS — N1831 Chronic kidney disease, stage 3a: Secondary | ICD-10-CM | POA: Diagnosis not present

## 2020-06-07 DIAGNOSIS — E7800 Pure hypercholesterolemia, unspecified: Secondary | ICD-10-CM | POA: Diagnosis not present

## 2020-06-07 DIAGNOSIS — J438 Other emphysema: Secondary | ICD-10-CM | POA: Diagnosis not present

## 2020-06-07 DIAGNOSIS — I5032 Chronic diastolic (congestive) heart failure: Secondary | ICD-10-CM | POA: Diagnosis not present

## 2020-06-21 DIAGNOSIS — M25552 Pain in left hip: Secondary | ICD-10-CM | POA: Diagnosis not present

## 2020-06-26 ENCOUNTER — Ambulatory Visit
Admission: RE | Admit: 2020-06-26 | Discharge: 2020-06-26 | Disposition: A | Discharge status: Home / Self Care | Payer: Medicare HMO | Source: Ambulatory Visit | Attending: Neurosurgery | Admitting: Neurosurgery

## 2020-06-26 ENCOUNTER — Other Ambulatory Visit: Payer: Self-pay

## 2020-06-26 DIAGNOSIS — R29898 Other symptoms and signs involving the musculoskeletal system: Secondary | ICD-10-CM

## 2020-06-26 DIAGNOSIS — M48061 Spinal stenosis, lumbar region without neurogenic claudication: Secondary | ICD-10-CM | POA: Diagnosis not present

## 2020-06-26 DIAGNOSIS — M545 Low back pain, unspecified: Secondary | ICD-10-CM | POA: Diagnosis not present

## 2020-06-27 DIAGNOSIS — M5416 Radiculopathy, lumbar region: Secondary | ICD-10-CM | POA: Diagnosis not present

## 2020-06-27 DIAGNOSIS — M5136 Other intervertebral disc degeneration, lumbar region: Secondary | ICD-10-CM | POA: Diagnosis not present

## 2020-06-27 DIAGNOSIS — M545 Low back pain, unspecified: Secondary | ICD-10-CM | POA: Diagnosis not present

## 2020-06-27 DIAGNOSIS — R29898 Other symptoms and signs involving the musculoskeletal system: Secondary | ICD-10-CM | POA: Diagnosis not present

## 2020-07-07 DIAGNOSIS — I5032 Chronic diastolic (congestive) heart failure: Secondary | ICD-10-CM | POA: Diagnosis not present

## 2020-07-07 DIAGNOSIS — J9620 Acute and chronic respiratory failure, unspecified whether with hypoxia or hypercapnia: Secondary | ICD-10-CM | POA: Diagnosis not present

## 2020-07-07 DIAGNOSIS — J441 Chronic obstructive pulmonary disease with (acute) exacerbation: Secondary | ICD-10-CM | POA: Diagnosis not present

## 2020-07-07 DIAGNOSIS — J9601 Acute respiratory failure with hypoxia: Secondary | ICD-10-CM | POA: Diagnosis not present

## 2020-07-07 DIAGNOSIS — Z96611 Presence of right artificial shoulder joint: Secondary | ICD-10-CM | POA: Diagnosis not present

## 2020-07-07 DIAGNOSIS — J9610 Chronic respiratory failure, unspecified whether with hypoxia or hypercapnia: Secondary | ICD-10-CM | POA: Diagnosis not present

## 2020-07-07 DIAGNOSIS — J449 Chronic obstructive pulmonary disease, unspecified: Secondary | ICD-10-CM | POA: Diagnosis not present

## 2020-07-29 DIAGNOSIS — J9611 Chronic respiratory failure with hypoxia: Secondary | ICD-10-CM | POA: Diagnosis not present

## 2020-07-29 DIAGNOSIS — I5032 Chronic diastolic (congestive) heart failure: Secondary | ICD-10-CM | POA: Diagnosis not present

## 2020-07-29 DIAGNOSIS — G63 Polyneuropathy in diseases classified elsewhere: Secondary | ICD-10-CM | POA: Diagnosis not present

## 2020-07-29 DIAGNOSIS — M48062 Spinal stenosis, lumbar region with neurogenic claudication: Secondary | ICD-10-CM | POA: Diagnosis not present

## 2020-07-29 DIAGNOSIS — I130 Hypertensive heart and chronic kidney disease with heart failure and stage 1 through stage 4 chronic kidney disease, or unspecified chronic kidney disease: Secondary | ICD-10-CM | POA: Diagnosis not present

## 2020-07-29 DIAGNOSIS — N1831 Chronic kidney disease, stage 3a: Secondary | ICD-10-CM | POA: Diagnosis not present

## 2020-07-29 DIAGNOSIS — R5383 Other fatigue: Secondary | ICD-10-CM | POA: Diagnosis not present

## 2020-07-29 DIAGNOSIS — J438 Other emphysema: Secondary | ICD-10-CM | POA: Diagnosis not present

## 2020-08-01 ENCOUNTER — Other Ambulatory Visit: Payer: Self-pay | Admitting: Internal Medicine

## 2020-08-01 NOTE — Telephone Encounter (Signed)
Patient is requesting refill on azithromycin 250mg . Rx is written for 1 tablet daily.  Dr. Mortimer Fries, please clarify. Should patient take this daily for only on Monday, wednesday and Friday?

## 2020-08-02 DIAGNOSIS — M5416 Radiculopathy, lumbar region: Secondary | ICD-10-CM | POA: Diagnosis not present

## 2020-08-06 DIAGNOSIS — J9601 Acute respiratory failure with hypoxia: Secondary | ICD-10-CM | POA: Diagnosis not present

## 2020-08-06 DIAGNOSIS — J449 Chronic obstructive pulmonary disease, unspecified: Secondary | ICD-10-CM | POA: Diagnosis not present

## 2020-08-06 DIAGNOSIS — J9620 Acute and chronic respiratory failure, unspecified whether with hypoxia or hypercapnia: Secondary | ICD-10-CM | POA: Diagnosis not present

## 2020-08-06 DIAGNOSIS — Z96611 Presence of right artificial shoulder joint: Secondary | ICD-10-CM | POA: Diagnosis not present

## 2020-08-06 DIAGNOSIS — I5032 Chronic diastolic (congestive) heart failure: Secondary | ICD-10-CM | POA: Diagnosis not present

## 2020-08-06 DIAGNOSIS — J441 Chronic obstructive pulmonary disease with (acute) exacerbation: Secondary | ICD-10-CM | POA: Diagnosis not present

## 2020-08-06 DIAGNOSIS — J9610 Chronic respiratory failure, unspecified whether with hypoxia or hypercapnia: Secondary | ICD-10-CM | POA: Diagnosis not present

## 2020-08-12 DIAGNOSIS — Z96653 Presence of artificial knee joint, bilateral: Secondary | ICD-10-CM | POA: Diagnosis not present

## 2020-08-26 DIAGNOSIS — S51811A Laceration without foreign body of right forearm, initial encounter: Secondary | ICD-10-CM | POA: Diagnosis not present

## 2020-08-26 DIAGNOSIS — W010XXA Fall on same level from slipping, tripping and stumbling without subsequent striking against object, initial encounter: Secondary | ICD-10-CM | POA: Diagnosis not present

## 2020-08-30 DIAGNOSIS — I5032 Chronic diastolic (congestive) heart failure: Secondary | ICD-10-CM | POA: Diagnosis not present

## 2020-08-31 DIAGNOSIS — M48061 Spinal stenosis, lumbar region without neurogenic claudication: Secondary | ICD-10-CM | POA: Diagnosis not present

## 2020-08-31 DIAGNOSIS — M5416 Radiculopathy, lumbar region: Secondary | ICD-10-CM | POA: Diagnosis not present

## 2020-08-31 DIAGNOSIS — M5136 Other intervertebral disc degeneration, lumbar region: Secondary | ICD-10-CM | POA: Diagnosis not present

## 2020-08-31 DIAGNOSIS — R29898 Other symptoms and signs involving the musculoskeletal system: Secondary | ICD-10-CM | POA: Diagnosis not present

## 2020-09-06 DIAGNOSIS — J9620 Acute and chronic respiratory failure, unspecified whether with hypoxia or hypercapnia: Secondary | ICD-10-CM | POA: Diagnosis not present

## 2020-09-06 DIAGNOSIS — J9601 Acute respiratory failure with hypoxia: Secondary | ICD-10-CM | POA: Diagnosis not present

## 2020-09-06 DIAGNOSIS — Z96611 Presence of right artificial shoulder joint: Secondary | ICD-10-CM | POA: Diagnosis not present

## 2020-09-06 DIAGNOSIS — J441 Chronic obstructive pulmonary disease with (acute) exacerbation: Secondary | ICD-10-CM | POA: Diagnosis not present

## 2020-09-06 DIAGNOSIS — J449 Chronic obstructive pulmonary disease, unspecified: Secondary | ICD-10-CM | POA: Diagnosis not present

## 2020-09-06 DIAGNOSIS — J9610 Chronic respiratory failure, unspecified whether with hypoxia or hypercapnia: Secondary | ICD-10-CM | POA: Diagnosis not present

## 2020-09-06 DIAGNOSIS — I5032 Chronic diastolic (congestive) heart failure: Secondary | ICD-10-CM | POA: Diagnosis not present

## 2020-09-16 DIAGNOSIS — H26491 Other secondary cataract, right eye: Secondary | ICD-10-CM | POA: Diagnosis not present

## 2020-09-21 DIAGNOSIS — Z79899 Other long term (current) drug therapy: Secondary | ICD-10-CM | POA: Diagnosis not present

## 2020-09-21 DIAGNOSIS — G4733 Obstructive sleep apnea (adult) (pediatric): Secondary | ICD-10-CM | POA: Diagnosis not present

## 2020-09-21 DIAGNOSIS — J9611 Chronic respiratory failure with hypoxia: Secondary | ICD-10-CM | POA: Diagnosis not present

## 2020-09-21 DIAGNOSIS — E034 Atrophy of thyroid (acquired): Secondary | ICD-10-CM | POA: Diagnosis not present

## 2020-09-21 DIAGNOSIS — N1831 Chronic kidney disease, stage 3a: Secondary | ICD-10-CM | POA: Diagnosis not present

## 2020-09-21 DIAGNOSIS — E7800 Pure hypercholesterolemia, unspecified: Secondary | ICD-10-CM | POA: Diagnosis not present

## 2020-09-21 DIAGNOSIS — J438 Other emphysema: Secondary | ICD-10-CM | POA: Diagnosis not present

## 2020-09-22 DIAGNOSIS — J438 Other emphysema: Secondary | ICD-10-CM | POA: Diagnosis not present

## 2020-09-22 DIAGNOSIS — E034 Atrophy of thyroid (acquired): Secondary | ICD-10-CM | POA: Diagnosis not present

## 2020-09-22 DIAGNOSIS — G4733 Obstructive sleep apnea (adult) (pediatric): Secondary | ICD-10-CM | POA: Diagnosis not present

## 2020-09-22 DIAGNOSIS — E7800 Pure hypercholesterolemia, unspecified: Secondary | ICD-10-CM | POA: Diagnosis not present

## 2020-09-22 DIAGNOSIS — N1831 Chronic kidney disease, stage 3a: Secondary | ICD-10-CM | POA: Diagnosis not present

## 2020-09-22 DIAGNOSIS — J9611 Chronic respiratory failure with hypoxia: Secondary | ICD-10-CM | POA: Diagnosis not present

## 2020-09-28 DIAGNOSIS — G4733 Obstructive sleep apnea (adult) (pediatric): Secondary | ICD-10-CM | POA: Diagnosis not present

## 2020-09-28 DIAGNOSIS — I5032 Chronic diastolic (congestive) heart failure: Secondary | ICD-10-CM | POA: Diagnosis not present

## 2020-09-28 DIAGNOSIS — J439 Emphysema, unspecified: Secondary | ICD-10-CM | POA: Diagnosis not present

## 2020-09-28 DIAGNOSIS — G629 Polyneuropathy, unspecified: Secondary | ICD-10-CM | POA: Diagnosis not present

## 2020-09-28 DIAGNOSIS — N1831 Chronic kidney disease, stage 3a: Secondary | ICD-10-CM | POA: Diagnosis not present

## 2020-09-28 DIAGNOSIS — E034 Atrophy of thyroid (acquired): Secondary | ICD-10-CM | POA: Diagnosis not present

## 2020-09-28 DIAGNOSIS — J9611 Chronic respiratory failure with hypoxia: Secondary | ICD-10-CM | POA: Diagnosis not present

## 2020-09-28 DIAGNOSIS — I130 Hypertensive heart and chronic kidney disease with heart failure and stage 1 through stage 4 chronic kidney disease, or unspecified chronic kidney disease: Secondary | ICD-10-CM | POA: Diagnosis not present

## 2020-09-28 DIAGNOSIS — Z1389 Encounter for screening for other disorder: Secondary | ICD-10-CM | POA: Diagnosis not present

## 2020-09-28 DIAGNOSIS — Z0000 Encounter for general adult medical examination without abnormal findings: Secondary | ICD-10-CM | POA: Diagnosis not present

## 2020-09-29 DIAGNOSIS — G4733 Obstructive sleep apnea (adult) (pediatric): Secondary | ICD-10-CM | POA: Diagnosis not present

## 2020-10-03 DIAGNOSIS — M5416 Radiculopathy, lumbar region: Secondary | ICD-10-CM | POA: Diagnosis not present

## 2020-10-13 DIAGNOSIS — M810 Age-related osteoporosis without current pathological fracture: Secondary | ICD-10-CM | POA: Diagnosis not present

## 2020-10-14 DIAGNOSIS — Z03818 Encounter for observation for suspected exposure to other biological agents ruled out: Secondary | ICD-10-CM | POA: Diagnosis not present

## 2020-10-14 DIAGNOSIS — J449 Chronic obstructive pulmonary disease, unspecified: Secondary | ICD-10-CM | POA: Diagnosis not present

## 2020-10-14 DIAGNOSIS — J441 Chronic obstructive pulmonary disease with (acute) exacerbation: Secondary | ICD-10-CM | POA: Diagnosis not present

## 2020-10-14 DIAGNOSIS — R059 Cough, unspecified: Secondary | ICD-10-CM | POA: Diagnosis not present

## 2020-10-14 DIAGNOSIS — J9811 Atelectasis: Secondary | ICD-10-CM | POA: Diagnosis not present

## 2020-10-14 DIAGNOSIS — R0602 Shortness of breath: Secondary | ICD-10-CM | POA: Diagnosis not present

## 2020-10-19 DIAGNOSIS — E034 Atrophy of thyroid (acquired): Secondary | ICD-10-CM | POA: Diagnosis not present

## 2020-10-26 DIAGNOSIS — J438 Other emphysema: Secondary | ICD-10-CM | POA: Diagnosis not present

## 2020-10-26 DIAGNOSIS — R509 Fever, unspecified: Secondary | ICD-10-CM | POA: Diagnosis not present

## 2020-10-26 DIAGNOSIS — J9811 Atelectasis: Secondary | ICD-10-CM | POA: Diagnosis not present

## 2020-10-26 DIAGNOSIS — K529 Noninfective gastroenteritis and colitis, unspecified: Secondary | ICD-10-CM | POA: Diagnosis not present

## 2020-10-26 DIAGNOSIS — R059 Cough, unspecified: Secondary | ICD-10-CM | POA: Diagnosis not present

## 2020-10-26 DIAGNOSIS — Z03818 Encounter for observation for suspected exposure to other biological agents ruled out: Secondary | ICD-10-CM | POA: Diagnosis not present

## 2020-10-26 DIAGNOSIS — J439 Emphysema, unspecified: Secondary | ICD-10-CM | POA: Diagnosis not present

## 2020-10-26 DIAGNOSIS — R0902 Hypoxemia: Secondary | ICD-10-CM | POA: Diagnosis not present

## 2020-10-26 DIAGNOSIS — I517 Cardiomegaly: Secondary | ICD-10-CM | POA: Diagnosis not present

## 2020-10-26 DIAGNOSIS — R051 Acute cough: Secondary | ICD-10-CM | POA: Diagnosis not present

## 2020-10-26 DIAGNOSIS — R6883 Chills (without fever): Secondary | ICD-10-CM | POA: Diagnosis not present

## 2020-10-31 DIAGNOSIS — M5136 Other intervertebral disc degeneration, lumbar region: Secondary | ICD-10-CM | POA: Diagnosis not present

## 2020-10-31 DIAGNOSIS — M545 Low back pain, unspecified: Secondary | ICD-10-CM | POA: Diagnosis not present

## 2020-10-31 DIAGNOSIS — M5416 Radiculopathy, lumbar region: Secondary | ICD-10-CM | POA: Diagnosis not present

## 2020-10-31 DIAGNOSIS — M48061 Spinal stenosis, lumbar region without neurogenic claudication: Secondary | ICD-10-CM | POA: Diagnosis not present

## 2020-11-02 DIAGNOSIS — S99912A Unspecified injury of left ankle, initial encounter: Secondary | ICD-10-CM | POA: Diagnosis not present

## 2020-11-02 DIAGNOSIS — M7989 Other specified soft tissue disorders: Secondary | ICD-10-CM | POA: Diagnosis not present

## 2020-11-02 DIAGNOSIS — W010XXA Fall on same level from slipping, tripping and stumbling without subsequent striking against object, initial encounter: Secondary | ICD-10-CM | POA: Diagnosis not present

## 2020-11-11 DIAGNOSIS — J441 Chronic obstructive pulmonary disease with (acute) exacerbation: Secondary | ICD-10-CM | POA: Diagnosis not present

## 2020-11-17 ENCOUNTER — Ambulatory Visit: Payer: Medicare HMO | Attending: Family Medicine

## 2020-11-17 ENCOUNTER — Other Ambulatory Visit: Payer: Self-pay | Admitting: Family Medicine

## 2020-11-17 ENCOUNTER — Other Ambulatory Visit: Payer: Self-pay

## 2020-11-17 DIAGNOSIS — J438 Other emphysema: Secondary | ICD-10-CM | POA: Diagnosis not present

## 2020-11-17 DIAGNOSIS — G4719 Other hypersomnia: Secondary | ICD-10-CM | POA: Diagnosis not present

## 2020-11-17 DIAGNOSIS — R5383 Other fatigue: Secondary | ICD-10-CM | POA: Diagnosis not present

## 2020-11-17 DIAGNOSIS — M79605 Pain in left leg: Secondary | ICD-10-CM | POA: Diagnosis not present

## 2020-11-17 DIAGNOSIS — G4733 Obstructive sleep apnea (adult) (pediatric): Secondary | ICD-10-CM | POA: Diagnosis not present

## 2020-11-17 DIAGNOSIS — J9611 Chronic respiratory failure with hypoxia: Secondary | ICD-10-CM

## 2020-11-17 DIAGNOSIS — W010XXD Fall on same level from slipping, tripping and stumbling without subsequent striking against object, subsequent encounter: Secondary | ICD-10-CM | POA: Diagnosis not present

## 2020-11-17 LAB — BLOOD GAS, ARTERIAL
Acid-Base Excess: 6.7 mmol/L — ABNORMAL HIGH (ref 0.0–2.0)
Bicarbonate: 33.3 mmol/L — ABNORMAL HIGH (ref 20.0–28.0)
FIO2: 0.21
O2 Saturation: 90.7 %
Patient temperature: 37
pCO2 arterial: 55 mmHg — ABNORMAL HIGH (ref 32.0–48.0)
pH, Arterial: 7.39 (ref 7.350–7.450)
pO2, Arterial: 61 mmHg — ABNORMAL LOW (ref 83.0–108.0)

## 2020-11-18 DIAGNOSIS — Z01818 Encounter for other preprocedural examination: Secondary | ICD-10-CM | POA: Diagnosis not present

## 2020-11-18 DIAGNOSIS — J449 Chronic obstructive pulmonary disease, unspecified: Secondary | ICD-10-CM | POA: Diagnosis not present

## 2020-11-25 DIAGNOSIS — S82435A Nondisplaced oblique fracture of shaft of left fibula, initial encounter for closed fracture: Secondary | ICD-10-CM | POA: Diagnosis not present

## 2020-11-25 DIAGNOSIS — I890 Lymphedema, not elsewhere classified: Secondary | ICD-10-CM | POA: Diagnosis not present

## 2020-11-25 DIAGNOSIS — S99912D Unspecified injury of left ankle, subsequent encounter: Secondary | ICD-10-CM | POA: Diagnosis not present

## 2020-11-25 DIAGNOSIS — G629 Polyneuropathy, unspecified: Secondary | ICD-10-CM | POA: Diagnosis not present

## 2020-11-25 DIAGNOSIS — M25572 Pain in left ankle and joints of left foot: Secondary | ICD-10-CM | POA: Diagnosis not present

## 2020-11-25 DIAGNOSIS — M2142 Flat foot [pes planus] (acquired), left foot: Secondary | ICD-10-CM | POA: Diagnosis not present

## 2020-11-25 DIAGNOSIS — M2141 Flat foot [pes planus] (acquired), right foot: Secondary | ICD-10-CM | POA: Diagnosis not present

## 2020-11-25 DIAGNOSIS — M19072 Primary osteoarthritis, left ankle and foot: Secondary | ICD-10-CM | POA: Diagnosis not present

## 2020-12-05 ENCOUNTER — Other Ambulatory Visit: Payer: Self-pay

## 2020-12-05 ENCOUNTER — Encounter: Payer: Medicare HMO | Attending: Pulmonary Disease

## 2020-12-05 DIAGNOSIS — J449 Chronic obstructive pulmonary disease, unspecified: Secondary | ICD-10-CM

## 2020-12-05 NOTE — Progress Notes (Signed)
Virtual Visit completed. Patient informed on EP and RD appointment and 6 Minute walk test. Patient also informed of patient health questionnaires on My Chart. Patient Verbalizes understanding. Visit diagnosis can be found in [facility] 11/18/2020.  Patient is to call back when she gets out of her boot. She had broke her ankle from a fall and will call back in two weeks for clearance to exercise.

## 2020-12-12 DIAGNOSIS — Z79899 Other long term (current) drug therapy: Secondary | ICD-10-CM | POA: Diagnosis not present

## 2020-12-12 DIAGNOSIS — N1831 Chronic kidney disease, stage 3a: Secondary | ICD-10-CM | POA: Diagnosis not present

## 2020-12-12 DIAGNOSIS — I10 Essential (primary) hypertension: Secondary | ICD-10-CM | POA: Diagnosis not present

## 2020-12-12 DIAGNOSIS — E7800 Pure hypercholesterolemia, unspecified: Secondary | ICD-10-CM | POA: Diagnosis not present

## 2020-12-12 DIAGNOSIS — E034 Atrophy of thyroid (acquired): Secondary | ICD-10-CM | POA: Diagnosis not present

## 2020-12-19 DIAGNOSIS — I5032 Chronic diastolic (congestive) heart failure: Secondary | ICD-10-CM | POA: Diagnosis not present

## 2020-12-19 DIAGNOSIS — E034 Atrophy of thyroid (acquired): Secondary | ICD-10-CM | POA: Diagnosis not present

## 2020-12-19 DIAGNOSIS — G63 Polyneuropathy in diseases classified elsewhere: Secondary | ICD-10-CM | POA: Diagnosis not present

## 2020-12-19 DIAGNOSIS — N1831 Chronic kidney disease, stage 3a: Secondary | ICD-10-CM | POA: Diagnosis not present

## 2020-12-19 DIAGNOSIS — G4733 Obstructive sleep apnea (adult) (pediatric): Secondary | ICD-10-CM | POA: Diagnosis not present

## 2020-12-19 DIAGNOSIS — I130 Hypertensive heart and chronic kidney disease with heart failure and stage 1 through stage 4 chronic kidney disease, or unspecified chronic kidney disease: Secondary | ICD-10-CM | POA: Diagnosis not present

## 2020-12-19 DIAGNOSIS — J439 Emphysema, unspecified: Secondary | ICD-10-CM | POA: Diagnosis not present

## 2020-12-19 DIAGNOSIS — M48062 Spinal stenosis, lumbar region with neurogenic claudication: Secondary | ICD-10-CM | POA: Diagnosis not present

## 2020-12-19 DIAGNOSIS — J9611 Chronic respiratory failure with hypoxia: Secondary | ICD-10-CM | POA: Diagnosis not present

## 2020-12-21 DIAGNOSIS — M19072 Primary osteoarthritis, left ankle and foot: Secondary | ICD-10-CM | POA: Diagnosis not present

## 2020-12-21 DIAGNOSIS — M2141 Flat foot [pes planus] (acquired), right foot: Secondary | ICD-10-CM | POA: Diagnosis not present

## 2020-12-21 DIAGNOSIS — M25572 Pain in left ankle and joints of left foot: Secondary | ICD-10-CM | POA: Diagnosis not present

## 2020-12-21 DIAGNOSIS — I890 Lymphedema, not elsewhere classified: Secondary | ICD-10-CM | POA: Diagnosis not present

## 2020-12-21 DIAGNOSIS — S99912D Unspecified injury of left ankle, subsequent encounter: Secondary | ICD-10-CM | POA: Diagnosis not present

## 2020-12-21 DIAGNOSIS — M2142 Flat foot [pes planus] (acquired), left foot: Secondary | ICD-10-CM | POA: Diagnosis not present

## 2020-12-21 DIAGNOSIS — G629 Polyneuropathy, unspecified: Secondary | ICD-10-CM | POA: Diagnosis not present

## 2020-12-21 DIAGNOSIS — S82435A Nondisplaced oblique fracture of shaft of left fibula, initial encounter for closed fracture: Secondary | ICD-10-CM | POA: Diagnosis not present

## 2021-01-03 DIAGNOSIS — L089 Local infection of the skin and subcutaneous tissue, unspecified: Secondary | ICD-10-CM | POA: Diagnosis not present

## 2021-01-03 DIAGNOSIS — L03115 Cellulitis of right lower limb: Secondary | ICD-10-CM | POA: Diagnosis not present

## 2021-01-03 DIAGNOSIS — J9622 Acute and chronic respiratory failure with hypercapnia: Secondary | ICD-10-CM | POA: Diagnosis not present

## 2021-01-03 DIAGNOSIS — T63481A Toxic effect of venom of other arthropod, accidental (unintentional), initial encounter: Secondary | ICD-10-CM | POA: Diagnosis not present

## 2021-01-03 DIAGNOSIS — S90521A Blister (nonthermal), right ankle, initial encounter: Secondary | ICD-10-CM | POA: Diagnosis not present

## 2021-01-10 ENCOUNTER — Encounter: Payer: Medicare HMO | Attending: Pulmonary Disease

## 2021-01-10 ENCOUNTER — Other Ambulatory Visit: Payer: Self-pay

## 2021-01-10 DIAGNOSIS — J449 Chronic obstructive pulmonary disease, unspecified: Secondary | ICD-10-CM | POA: Diagnosis not present

## 2021-01-10 NOTE — Patient Instructions (Signed)
Patient Instructions  Patient Details  Name: Hannah Powell MRN: [id number] Date of Birth: 25-Aug-1945 Referring Provider:  Ottie Glazier, MD  Below are your personal goals for exercise, nutrition, and risk factors. Our goal is to help you stay on track towards obtaining and maintaining these goals. We will be discussing your progress on these goals with you throughout the program.  Initial Exercise Prescription:  Initial Exercise Prescription - 01/10/21 1500       Date of Initial Exercise RX and Referring Provider   Date 01/10/21    Referring Provider Ottie Glazier MD      Oxygen   Maintain Oxygen Saturation 88% or higher      Recumbant Bike   Level 1    RPM 60    Minutes 15    METs 1      NuStep   Level 1    SPM 80    Minutes 15    METs 1      REL-XR   Level 1    Speed 50    Minutes 15    METs 1      Track   Laps 2   as tolerated   Minutes 15    METs 1      Prescription Details   Frequency (times per week) 2    Duration Progress to 30 minutes of continuous aerobic without signs/symptoms of physical distress      Intensity   THRR 40-80% of Max Heartrate 101-131    Ratings of Perceived Exertion 11-13    Perceived Dyspnea 0-4      Progression   Progression Continue to progress workloads to maintain intensity without signs/symptoms of physical distress.      Resistance Training   Training Prescription Yes    Weight 3 lb    Reps 10-15             Exercise Goals: Frequency: Be able to perform aerobic exercise two to three times per week in program working toward 2-5 days per week of home exercise.  Intensity: Work with a perceived exertion of 11 (fairly light) - 15 (hard) while following your exercise prescription.  We will make changes to your prescription with you as you progress through the program.   Duration: Be able to do 30 to 45 minutes of continuous aerobic exercise in addition to a 5 minute warm-up and a 5 minute cool-down routine.    Nutrition Goals: Your personal nutrition goals will be established when you do your nutrition analysis with the dietician.  The following are general nutrition guidelines to follow: Cholesterol < 200mg /day Sodium < 1500mg /day Fiber: Women over 50 yrs - 21 grams per day  Personal Goals:  Personal Goals and Risk Factors at Admission - 01/10/21 1516       Core Components/Risk Factors/Patient Goals on Admission    Weight Management Yes;Weight Loss    Intervention Weight Management: Develop a combined nutrition and exercise program designed to reach desired caloric intake, while maintaining appropriate intake of nutrient and fiber, sodium and fats, and appropriate energy expenditure required for the weight goal.;Weight Management: Provide education and appropriate resources to help participant work on and attain dietary goals.;Weight Management/Obesity: Establish reasonable short term and long term weight goals.    Admit Weight 232 lb (105.2 kg)    Goal Weight: Short Term 227 lb (103 kg)    Goal Weight: Long Term 200 lb (90.7 kg)    Expected Outcomes Short Term: Continue to assess  and modify interventions until short term weight is achieved;Long Term: Adherence to nutrition and physical activity/exercise program aimed toward attainment of established weight goal;Weight Loss: Understanding of general recommendations for a balanced deficit meal plan, which promotes 1-2 lb weight loss per week and includes a negative energy balance of [phone] kcal/d;Understanding recommendations for meals to include 15-35% energy as protein, 25-35% energy from fat, 35-60% energy from carbohydrates, less than 200mg  of dietary cholesterol, 20-35 gm of total fiber daily;Understanding of distribution of calorie intake throughout the day with the consumption of 4-5 meals/snacks    Improve shortness of breath with ADL's Yes    Intervention Provide education, individualized exercise plan and daily activity instruction to  help decrease symptoms of SOB with activities of daily living.    Expected Outcomes Short Term: Improve cardiorespiratory fitness to achieve a reduction of symptoms when performing ADLs;Long Term: Be able to perform more ADLs without symptoms or delay the onset of symptoms    Heart Failure Yes    Intervention Provide a combined exercise and nutrition program that is supplemented with education, support and counseling about heart failure. Directed toward relieving symptoms such as shortness of breath, decreased exercise tolerance, and extremity edema.    Expected Outcomes Improve functional capacity of life;Short term: Attendance in program 2-3 days a week with increased exercise capacity. Reported lower sodium intake. Reported increased fruit and vegetable intake. Reports medication compliance.;Short term: Daily weights obtained and reported for increase. Utilizing diuretic protocols set by physician.;Long term: Adoption of self-care skills and reduction of barriers for early signs and symptoms recognition and intervention leading to self-care maintenance.    Hypertension Yes    Intervention Provide education on lifestyle modifcations including regular physical activity/exercise, weight management, moderate sodium restriction and increased consumption of fresh fruit, vegetables, and low fat dairy, alcohol moderation, and smoking cessation.;Monitor prescription use compliance.    Expected Outcomes Short Term: Continued assessment and intervention until BP is < 140/51mm HG in hypertensive participants. < 130/28mm HG in hypertensive participants with diabetes, heart failure or chronic kidney disease.;Long Term: Maintenance of blood pressure at goal levels.             Tobacco Use Initial Evaluation: Social History   Tobacco Use  Smoking Status Former   Packs/day: 1.00   Years: 10.00   Pack years: 10.00   Types: Cigarettes   Quit date: 03/20/1975   Years since quitting: 45.8  Smokeless Tobacco  Never    Exercise Goals and Review:  Exercise Goals     Row Name 01/10/21 1515             Exercise Goals   Increase Physical Activity Yes       Intervention Provide advice, education, support and counseling about physical activity/exercise needs.;Develop an individualized exercise prescription for aerobic and resistive training based on initial evaluation findings, risk stratification, comorbidities and participant's personal goals.       Expected Outcomes Short Term: Attend rehab on a regular basis to increase amount of physical activity.;Long Term: Add in home exercise to make exercise part of routine and to increase amount of physical activity.;Long Term: Exercising regularly at least 3-5 days a week.       Increase Strength and Stamina Yes       Intervention Provide advice, education, support and counseling about physical activity/exercise needs.;Develop an individualized exercise prescription for aerobic and resistive training based on initial evaluation findings, risk stratification, comorbidities and participant's personal goals.  Expected Outcomes Short Term: Increase workloads from initial exercise prescription for resistance, speed, and METs.;Short Term: Perform resistance training exercises routinely during rehab and add in resistance training at home;Long Term: Improve cardiorespiratory fitness, muscular endurance and strength as measured by increased METs and functional capacity (6MWT)       Able to understand and use rate of perceived exertion (RPE) scale Yes       Intervention Provide education and explanation on how to use RPE scale       Expected Outcomes Short Term: Able to use RPE daily in rehab to express subjective intensity level;Long Term:  Able to use RPE to guide intensity level when exercising independently       Able to understand and use Dyspnea scale Yes       Intervention Provide education and explanation on how to use Dyspnea scale       Expected Outcomes  Short Term: Able to use Dyspnea scale daily in rehab to express subjective sense of shortness of breath during exertion;Long Term: Able to use Dyspnea scale to guide intensity level when exercising independently       Knowledge and understanding of Target Heart Rate Range (THRR) Yes       Intervention Provide education and explanation of THRR including how the numbers were predicted and where they are located for reference       Expected Outcomes Short Term: Able to state/look up THRR;Long Term: Able to use THRR to govern intensity when exercising independently;Short Term: Able to use daily as guideline for intensity in rehab       Able to check pulse independently Yes       Intervention Provide education and demonstration on how to check pulse in carotid and radial arteries.;Review the importance of being able to check your own pulse for safety during independent exercise       Expected Outcomes Short Term: Able to explain why pulse checking is important during independent exercise;Long Term: Able to check pulse independently and accurately       Understanding of Exercise Prescription Yes       Intervention Provide education, explanation, and written materials on patient's individual exercise prescription       Expected Outcomes Short Term: Able to explain program exercise prescription;Long Term: Able to explain home exercise prescription to exercise independently                Copy of goals given to participant.

## 2021-01-10 NOTE — Progress Notes (Signed)
Pulmonary Individual Treatment Plan  Patient Details  Name: Hannah Powell MRN: [id number] Date of Birth: Sep 01, 1945 Referring Provider:   Flowsheet Row Pulmonary Rehab from 01/10/2021 in [facility] Cardiac and Pulmonary Rehab  Referring Provider Ottie Glazier MD       Initial Encounter Date:  Flowsheet Row Pulmonary Rehab from 01/10/2021 in [facility] Cardiac and Pulmonary Rehab  Date 01/10/21       Visit Diagnosis: Chronic obstructive pulmonary disease, unspecified COPD type ([place])  Patient's Home Medications on Admission:  Current Outpatient Medications:    albuterol (PROVENTIL) (2.5 MG/3ML) 0.083% nebulizer solution, Take 2.5 mg by nebulization every 6 (six) hours as needed for wheezing or shortness of breath., Disp: , Rfl:    ALPRAZolam (XANAX) 0.25 MG tablet, Take 1 tablet (0.25 mg total) by mouth 3 (three) times daily as needed for anxiety., Disp: 30 tablet, Rfl: 0   ALPRAZolam (XANAX) 0.25 MG tablet, Take 1 tablet by mouth 3 (three) times daily as needed. (Patient not taking: Reported on 12/05/2020), Disp: , Rfl:    amoxicillin (AMOXIL) 500 MG capsule, amoxicillin 500 mg capsule (Patient not taking: Reported on 12/05/2020), Disp: , Rfl:    azelastine (ASTELIN) 0.1 % nasal spray, , Disp: , Rfl:    azithromycin (ZITHROMAX) 250 MG tablet, TAKE 1 TABLET (250 MG TOTAL) BY MOUTH DAILY., Disp: 90 tablet, Rfl: 1   B Complex-C (B-COMPLEX WITH VITAMIN C) tablet, Take 1 tablet by mouth daily. , Disp: , Rfl:    calcium carbonate (OSCAL) 1500 (600 Ca) MG TABS tablet, Take by mouth., Disp: , Rfl:    Calcium Carbonate-Vitamin D (CALCIUM-VITAMIN D) 500-200 MG-UNIT per tablet, Take 1 tablet by mouth daily., Disp: , Rfl:    cyclobenzaprine (FLEXERIL) 10 MG tablet, Take 10 mg by mouth 3 (three) times daily as needed for muscle spasms.  (Patient not taking: Reported on 12/05/2020), Disp: , Rfl:    ferrous sulfate 325 (65 FE) MG tablet, Take 325 mg by mouth daily with breakfast., Disp: , Rfl:     fluticasone furoate-vilanterol (BREO ELLIPTA) 200-25 MCG/INH AEPB, Inhale 1 puff into the lungs daily. (Patient not taking: Reported on 12/05/2020), Disp: 1 each, Rfl: 0   fluticasone furoate-vilanterol (BREO ELLIPTA) 200-25 MCG/INH AEPB, Inhale 1 puff into the lungs daily., Disp: 60 each, Rfl: 3   fluticasone furoate-vilanterol (BREO ELLIPTA) 200-25 MCG/INH AEPB, Inhale into the lungs. (Patient not taking: Reported on 12/05/2020), Disp: , Rfl:    furosemide (LASIX) 20 MG tablet, Take 2 tablets (40 mg total) by mouth 2 (two) times daily. (Patient not taking: Reported on 12/05/2020), Disp: , Rfl:    furosemide (LASIX) 80 MG tablet, Take by mouth., Disp: , Rfl:    gabapentin (NEURONTIN) 300 MG capsule, Take 300 mg by mouth at bedtime.  (Patient not taking: Reported on 12/05/2020), Disp: , Rfl:    ibuprofen (ADVIL) 800 MG tablet, Take by mouth. (Patient not taking: Reported on 12/05/2020), Disp: , Rfl:    ibuprofen (ADVIL,MOTRIN) 200 MG tablet, Take 800 mg by mouth every 6 (six) hours as needed for headache or mild pain. , Disp: , Rfl:    ipratropium-albuterol (DUONEB) 0.5-2.5 (3) MG/3ML SOLN, Take 3 mLs by nebulization every 4 (four) hours as needed., Disp: , Rfl:    ketoconazole (NIZORAL) 2 % cream, Apply once daily under the breast and left under arm., Disp: 60 g, Rfl: 1   levalbuterol (XOPENEX) 0.31 MG/3ML nebulizer solution, Inhale into the lungs., Disp: , Rfl:    levothyroxine (SYNTHROID) 112 MCG  tablet, Take 112 mcg by mouth daily before breakfast. Take 2 tabs po QD (Patient not taking: Reported on 12/05/2020), Disp: , Rfl:    levothyroxine (SYNTHROID) 112 MCG tablet, Take by mouth., Disp: , Rfl:    levothyroxine (SYNTHROID, LEVOTHROID) 200 MCG tablet, Take 200 mcg by mouth daily before breakfast., Disp: , Rfl:    loratadine-pseudoephedrine (CLARITIN-D 24-HOUR) 10-240 MG 24 hr tablet, Take 1 tablet by mouth daily as needed for allergies., Disp: , Rfl:    losartan (COZAAR) 25 MG tablet, Take 25 mg by  mouth daily., Disp: , Rfl:    losartan (COZAAR) 25 MG tablet, Take 1 tablet by mouth daily. (Patient not taking: Reported on 12/05/2020), Disp: , Rfl:    Multiple Vitamins-Minerals (MULTIVITAMIN WOMEN PO), Take 1 tablet by mouth daily., Disp: , Rfl:    Omega-3 Fatty Acids (OMEGA 3 PO), Take 1 capsule by mouth daily., Disp: , Rfl:    oxybutynin (DITROPAN) 5 MG tablet, Take 5 mg by mouth 3 (three) times daily. (Patient not taking: Reported on 12/05/2020), Disp: , Rfl:    oxybutynin (DITROPAN) 5 MG tablet, Take 1 tablet by mouth 3 (three) times daily., Disp: , Rfl:    potassium chloride SA (K-DUR,KLOR-CON) 20 MEQ tablet, Take 20 mEq by mouth 2 (two) times daily.  (Patient not taking: Reported on 12/05/2020), Disp: , Rfl:    potassium chloride SA (KLOR-CON) 20 MEQ tablet, Take 1 tablet by mouth 2 (two) times daily., Disp: , Rfl:    pramipexole (MIRAPEX) 1 MG tablet, Take 4 mg by mouth at bedtime. , Disp: , Rfl:    spironolactone (ALDACTONE) 25 MG tablet, Take 25 mg by mouth 2 (two) times daily. , Disp: , Rfl:    spironolactone (ALDACTONE) 25 MG tablet, Take 1 tablet by mouth 2 (two) times daily. (Patient not taking: Reported on 12/05/2020), Disp: , Rfl:    topiramate (TOPAMAX) 100 MG tablet, Take 100 mg by mouth 2 (two) times daily., Disp: , Rfl:    topiramate (TOPAMAX) 100 MG tablet, Take by mouth. (Patient not taking: Reported on 12/05/2020), Disp: , Rfl:    traMADol (ULTRAM) 50 MG tablet, Take 1 tablet (50 mg total) by mouth every 8 (eight) hours as needed (for mild pain)., Disp: 30 tablet, Rfl: 0   traMADol (ULTRAM) 50 MG tablet, Take 1 tablet by mouth every 8 (eight) hours as needed. (Patient not taking: Reported on 12/05/2020), Disp: , Rfl:    zolpidem (AMBIEN) 10 MG tablet, Take 5 mg by mouth at bedtime as needed for sleep., Disp: , Rfl:   Past Medical History: Past Medical History:  Diagnosis Date   Anxiety    Arthritis    CHF (congestive heart failure) (HCC)    COPD (chronic obstructive  pulmonary disease) ([place])    Dysplastic nevus 04/22/2019   R abdomen parallel to sup umbilicus - mod   Dysplastic nevus 04/22/2019   R low back above waistline - mild    Edema    feet/legs   Hypertension    dr Otho Najjar      Hypothyroidism    Leg weakness, bilateral    Osteoporosis    Oxygen deficit    2l with bipap at night   RLS (restless legs syndrome)    Shortness of breath    Sleep apnea    bipap   Wheezing     Tobacco Use: Social History   Tobacco Use  Smoking Status Former   Packs/day: 1.00   Years: 10.00  Pack years: 10.00   Types: Cigarettes   Quit date: 03/20/1975   Years since quitting: 45.8  Smokeless Tobacco Never    Labs: Recent Review Flowsheet Data     Labs for ITP Cardiac and Pulmonary Rehab Latest Ref Rng & Units 07/15/2016 07/15/2016 07/17/2016 08/01/2016 11/17/2020   Trlycerides <150 mg/dL - - 142 - -   Hemoglobin A1c 4.0 - 6.0 % - - - - -   PHART 7.350 - 7.450 7.44 7.52(H) - - 7.39   PCO2ART 32.0 - 48.0 mmHg 91(HH) 71(HH) - - 55(H)   HCO3 20.0 - 28.0 mmol/L 61.8(H) 58.0(H) - 54.6(H) 33.3(H)   ACIDBASEDEF 0.0 - 2.0 mmol/L - - - - -   O2SAT % 97.2 94.9 - 67.1 90.7        Pulmonary Assessment Scores:  Pulmonary Assessment Scores     Row Name 01/10/21 1126         ADL UCSD   ADL Phase Entry     SOB Score total 11     Rest 0     Walk 1     Stairs 0  didn't complete as she stated she does not do them     Bath 0     Dress 1     Shop 1       CAT Score   CAT Score 9       mMRC Score   mMRC Score 0              UCSD: Self-administered rating of dyspnea associated with activities of daily living (ADLs) 6-point scale (0 = "not at all" to 5 = "maximal or unable to do because of breathlessness")  Scoring Scores range from 0 to 120.  Minimally important difference is 5 units  CAT: CAT can identify the health impairment of COPD patients and is better correlated with disease progression.  CAT has a scoring range of zero to 40. The CAT  score is classified into four groups of low (less than 10), medium (10 - 20), high (21-30) and very high (31-40) based on the impact level of disease on health status. A CAT score over 10 suggests significant symptoms.  A worsening CAT score could be explained by an exacerbation, poor medication adherence, poor inhaler technique, or progression of COPD or comorbid conditions.  CAT MCID is 2 points  mMRC: mMRC (Modified Medical Research Council) Dyspnea Scale is used to assess the degree of baseline functional disability in patients of respiratory disease due to dyspnea. No minimal important difference is established. A decrease in score of 1 point or greater is considered a positive change.   Pulmonary Function Assessment:  Pulmonary Function Assessment - 12/05/20 1118       Breath   Shortness of Breath Yes;Limiting activity             Exercise Target Goals: Exercise Program Goal: Individual exercise prescription set using results from initial 6 min walk test and THRR while considering  patient's activity barriers and safety.   Exercise Prescription Goal: Initial exercise prescription builds to 30-45 minutes a day of aerobic activity, 2-3 days per week.  Home exercise guidelines will be given to patient during program as part of exercise prescription that the participant will acknowledge.  Education: Aerobic Exercise: - Group verbal and visual presentation on the components of exercise prescription. Introduces F.I.T.T principle from ACSM for exercise prescriptions.  Reviews F.I.T.T. principles of aerobic exercise including progression. Written material given at graduation. Flowsheet  Row Pulmonary Rehab from 01/10/2021 in [facility] Cardiac and Pulmonary Rehab  Education need identified 01/10/21       Education: Resistance Exercise: - Group verbal and visual presentation on the components of exercise prescription. Introduces F.I.T.T principle from ACSM for exercise prescriptions   Reviews F.I.T.T. principles of resistance exercise including progression. Written material given at graduation.    Education: Exercise & Equipment Safety: - Individual verbal instruction and demonstration of equipment use and safety with use of the equipment. Flowsheet Row Pulmonary Rehab from 12/05/2020 in [facility] Cardiac and Pulmonary Rehab  Date 12/05/20  Educator [facility]  Instruction Review Code 1- Verbalizes Understanding       Education: Exercise Physiology & General Exercise Guidelines: - Group verbal and written instruction with models to review the exercise physiology of the cardiovascular system and associated critical values. Provides general exercise guidelines with specific guidelines to those with heart or lung disease.    Education: Flexibility, Balance, Mind/Body Relaxation: - Group verbal and visual presentation with interactive activity on the components of exercise prescription. Introduces F.I.T.T principle from ACSM for exercise prescriptions. Reviews F.I.T.T. principles of flexibility and balance exercise training including progression. Also discusses the mind body connection.  Reviews various relaxation techniques to help reduce and manage stress (i.e. Deep breathing, progressive muscle relaxation, and visualization). Balance handout provided to take home. Written material given at graduation.   Activity Barriers & Risk Stratification:   6 Minute Walk:  6 Minute Walk     Row Name 01/10/21 1142         6 Minute Walk   Phase Initial     Distance 165 feet     Walk Time 0 minutes     # of Rest Breaks 0     MPH 0.31     METS 0.17     RPE 13     Perceived Dyspnea  1     VO2 Peak 0.62     Symptoms Yes (comment)     Comments Legs fatigued     Resting HR 72 bpm     Resting BP 124/78     Resting Oxygen Saturation  95 %     Exercise Oxygen Saturation  during 6 min walk 85 %     Max Ex. HR 106 bpm     Max Ex. BP 152/76     2 Minute Post BP 130/76       Interval HR    1 Minute HR 93     2 Minute HR 97     3 Minute HR 98     4 Minute HR 106     5 Minute HR 105     6 Minute HR 100     2 Minute Post HR 75     Interval Heart Rate? Yes       Interval Oxygen   Interval Oxygen? Yes     Baseline Oxygen Saturation % 95 %     1 Minute Oxygen Saturation % 90 %     1 Minute Liters of Oxygen 0 L  RA     2 Minute Oxygen Saturation % 87 %     2 Minute Liters of Oxygen 0 L     3 Minute Oxygen Saturation % 88 %     3 Minute Liters of Oxygen 0 L     4 Minute Oxygen Saturation % 89 %     4 Minute Liters of Oxygen 0 L     5  Minute Oxygen Saturation % 89 %     5 Minute Liters of Oxygen 0 L     6 Minute Oxygen Saturation % 85 %     6 Minute Liters of Oxygen 0 L     2 Minute Post Oxygen Saturation % 93 %     2 Minute Post Liters of Oxygen 0 L             Oxygen Initial Assessment:  Oxygen Initial Assessment - 12/05/20 1116       Home Oxygen   Home Oxygen Device Home Concentrator;Portable Concentrator    Sleep Oxygen Prescription BiPAP    Liters per minute 2    Home Exercise Oxygen Prescription None    Home Resting Oxygen Prescription None    Compliance with Home Oxygen Use Yes      Initial 6 min Walk   Oxygen Used None      Program Oxygen Prescription   Program Oxygen Prescription None      Intervention   Short Term Goals To learn and exhibit compliance with exercise, home and travel O2 prescription;To learn and understand importance of monitoring SPO2 with pulse oximeter and demonstrate accurate use of the pulse oximeter.;To learn and understand importance of maintaining oxygen saturations>88%;To learn and demonstrate proper pursed lip breathing techniques or other breathing techniques. ;To learn and demonstrate proper use of respiratory medications    Long  Term Goals Exhibits compliance with exercise, home  and travel O2 prescription;Verbalizes importance of monitoring SPO2 with pulse oximeter and return demonstration;Maintenance of O2  saturations>88%;Exhibits proper breathing techniques, such as pursed lip breathing or other method taught during program session;Compliance with respiratory medication;Demonstrates proper use of MDI's             Oxygen Re-Evaluation:   Oxygen Discharge (Final Oxygen Re-Evaluation):   Initial Exercise Prescription:  Initial Exercise Prescription - 01/10/21 1500       Date of Initial Exercise RX and Referring Provider   Date 01/10/21    Referring Provider Ottie Glazier MD      Oxygen   Maintain Oxygen Saturation 88% or higher      Recumbant Bike   Level 1    RPM 60    Minutes 15    METs 1      NuStep   Level 1    SPM 80    Minutes 15    METs 1      REL-XR   Level 1    Speed 50    Minutes 15    METs 1      Track   Laps 2   as tolerated   Minutes 15    METs 1      Prescription Details   Frequency (times per week) 2    Duration Progress to 30 minutes of continuous aerobic without signs/symptoms of physical distress      Intensity   THRR 40-80% of Max Heartrate 101-131    Ratings of Perceived Exertion 11-13    Perceived Dyspnea 0-4      Progression   Progression Continue to progress workloads to maintain intensity without signs/symptoms of physical distress.      Resistance Training   Training Prescription Yes    Weight 3 lb    Reps 10-15             Perform Capillary Blood Glucose checks as needed.  Exercise Prescription Changes:   Exercise Prescription Changes     Row Name 01/10/21  1500             Response to Exercise   Blood Pressure (Admit) 124/78       Blood Pressure (Exercise) 152/76       Blood Pressure (Exit) 132/78       Heart Rate (Admit) 72 bpm       Heart Rate (Exercise) 106 bpm       Heart Rate (Exit) 75 bpm       Oxygen Saturation (Admit) 95 %       Oxygen Saturation (Exercise) 85 %       Oxygen Saturation (Exit) 94 %       Rating of Perceived Exertion (Exercise) 13       Perceived Dyspnea (Exercise) 1        Symptoms Legs fatigued       Comments walk test results                Exercise Comments:   Exercise Goals and Review:   Exercise Goals     Row Name 01/10/21 1515             Exercise Goals   Increase Physical Activity Yes       Intervention Provide advice, education, support and counseling about physical activity/exercise needs.;Develop an individualized exercise prescription for aerobic and resistive training based on initial evaluation findings, risk stratification, comorbidities and participant's personal goals.       Expected Outcomes Short Term: Attend rehab on a regular basis to increase amount of physical activity.;Long Term: Add in home exercise to make exercise part of routine and to increase amount of physical activity.;Long Term: Exercising regularly at least 3-5 days a week.       Increase Strength and Stamina Yes       Intervention Provide advice, education, support and counseling about physical activity/exercise needs.;Develop an individualized exercise prescription for aerobic and resistive training based on initial evaluation findings, risk stratification, comorbidities and participant's personal goals.       Expected Outcomes Short Term: Increase workloads from initial exercise prescription for resistance, speed, and METs.;Short Term: Perform resistance training exercises routinely during rehab and add in resistance training at home;Long Term: Improve cardiorespiratory fitness, muscular endurance and strength as measured by increased METs and functional capacity (6MWT)       Able to understand and use rate of perceived exertion (RPE) scale Yes       Intervention Provide education and explanation on how to use RPE scale       Expected Outcomes Short Term: Able to use RPE daily in rehab to express subjective intensity level;Long Term:  Able to use RPE to guide intensity level when exercising independently       Able to understand and use Dyspnea scale Yes        Intervention Provide education and explanation on how to use Dyspnea scale       Expected Outcomes Short Term: Able to use Dyspnea scale daily in rehab to express subjective sense of shortness of breath during exertion;Long Term: Able to use Dyspnea scale to guide intensity level when exercising independently       Knowledge and understanding of Target Heart Rate Range (THRR) Yes       Intervention Provide education and explanation of THRR including how the numbers were predicted and where they are located for reference       Expected Outcomes Short Term: Able to state/look up THRR;Long Term: Able to use THRR to  govern intensity when exercising independently;Short Term: Able to use daily as guideline for intensity in rehab       Able to check pulse independently Yes       Intervention Provide education and demonstration on how to check pulse in carotid and radial arteries.;Review the importance of being able to check your own pulse for safety during independent exercise       Expected Outcomes Short Term: Able to explain why pulse checking is important during independent exercise;Long Term: Able to check pulse independently and accurately       Understanding of Exercise Prescription Yes       Intervention Provide education, explanation, and written materials on patient's individual exercise prescription       Expected Outcomes Short Term: Able to explain program exercise prescription;Long Term: Able to explain home exercise prescription to exercise independently                Exercise Goals Re-Evaluation :   Discharge Exercise Prescription (Final Exercise Prescription Changes):  Exercise Prescription Changes - 01/10/21 1500       Response to Exercise   Blood Pressure (Admit) 124/78    Blood Pressure (Exercise) 152/76    Blood Pressure (Exit) 132/78    Heart Rate (Admit) 72 bpm    Heart Rate (Exercise) 106 bpm    Heart Rate (Exit) 75 bpm    Oxygen Saturation (Admit) 95 %    Oxygen  Saturation (Exercise) 85 %    Oxygen Saturation (Exit) 94 %    Rating of Perceived Exertion (Exercise) 13    Perceived Dyspnea (Exercise) 1    Symptoms Legs fatigued    Comments walk test results             Nutrition:  Target Goals: Understanding of nutrition guidelines, daily intake of sodium 1500mg , cholesterol 200mg , calories 30% from fat and 7% or less from saturated fats, daily to have 5 or more servings of fruits and vegetables.  Education: All About Nutrition: -Group instruction provided by verbal, written material, interactive activities, discussions, models, and posters to present general guidelines for heart healthy nutrition including fat, fiber, MyPlate, the role of sodium in heart healthy nutrition, utilization of the nutrition label, and utilization of this knowledge for meal planning. Follow up email sent as well. Written material given at graduation.   Biometrics:    Nutrition Therapy Plan and Nutrition Goals:  Nutrition Therapy & Goals - 01/10/21 1516       Intervention Plan   Intervention Prescribe, educate and counsel regarding individualized specific dietary modifications aiming towards targeted core components such as weight, hypertension, lipid management, diabetes, heart failure and other comorbidities.    Expected Outcomes Short Term Goal: Understand basic principles of dietary content, such as calories, fat, sodium, cholesterol and nutrients.;Short Term Goal: A plan has been developed with personal nutrition goals set during dietitian appointment.;Long Term Goal: Adherence to prescribed nutrition plan.             Nutrition Assessments:  MEDIFICTS Score Key: ?70 Need to make dietary changes  40-70 Heart Healthy Diet ? 40 Therapeutic Level Cholesterol Diet  Flowsheet Row Pulmonary Rehab from 01/10/2021 in [facility] Cardiac and Pulmonary Rehab  Picture Your Plate Total Score on Admission 76      Picture Your Plate Scores: <14 Unhealthy dietary  pattern with much room for improvement. 41-50 Dietary pattern unlikely to meet recommendations for good health and room for improvement. 51-60 More healthful dietary pattern, with some room  for improvement.  >60 Healthy dietary pattern, although there may be some specific behaviors that could be improved.   Nutrition Goals Re-Evaluation:   Nutrition Goals Discharge (Final Nutrition Goals Re-Evaluation):   Psychosocial: Target Goals: Acknowledge presence or absence of significant depression and/or stress, maximize coping skills, provide positive support system. Participant is able to verbalize types and ability to use techniques and skills needed for reducing stress and depression.   Education: Stress, Anxiety, and Depression - Group verbal and visual presentation to define topics covered.  Reviews how body is impacted by stress, anxiety, and depression.  Also discusses healthy ways to reduce stress and to treat/manage anxiety and depression.  Written material given at graduation.   Education: Sleep Hygiene -Provides group verbal and written instruction about how sleep can affect your health.  Define sleep hygiene, discuss sleep cycles and impact of sleep habits. Review good sleep hygiene tips.    Initial Review & Psychosocial Screening:  Initial Psych Review & Screening - 12/05/20 1119       Initial Review   Current issues with Current Sleep Concerns;Current Stress Concerns    Source of Stress Concerns Chronic Illness;Unable to participate in former interests or hobbies    Comments Hagen states that she is not depressed but is having a hard time coping with her legs being weak. She can look to her husband, two children and grand children that she can depend on.      Family Dynamics   Good Support System? Yes      Barriers   Psychosocial barriers to participate in program The patient should benefit from training in stress management and relaxation.      Screening Interventions    Interventions To provide support and resources with identified psychosocial needs;Provide feedback about the scores to participant;Encouraged to exercise    Expected Outcomes Short Term goal: Utilizing psychosocial counselor, staff and physician to assist with identification of specific Stressors or current issues interfering with healing process. Setting desired goal for each stressor or current issue identified.;Long Term Goal: Stressors or current issues are controlled or eliminated.;Short Term goal: Identification and review with participant of any Quality of Life or Depression concerns found by scoring the questionnaire.;Long Term goal: The participant improves quality of Life and PHQ9 Scores as seen by post scores and/or verbalization of changes             Quality of Life Scores:  Scores of 19 and below usually indicate a poorer quality of life in these areas.  A difference of  2-3 points is a clinically meaningful difference.  A difference of 2-3 points in the total score of the Quality of Life Index has been associated with significant improvement in overall quality of life, self-image, physical symptoms, and general health in studies assessing change in quality of life.  PHQ-9: Recent Review Flowsheet Data     Depression screen [facility] 2/9 01/10/2021 10/01/2016 08/09/2016   Decreased Interest 0 0 0   Down, Depressed, Hopeless 0 0 0   PHQ - 2 Score 0 0 0   Altered sleeping 1 - -   Tired, decreased energy 1 - -   Change in appetite 0 - -   Feeling bad or failure about yourself  1 - -   Trouble concentrating 0 - -   Moving slowly or fidgety/restless 0 - -   Suicidal thoughts 0 - -   PHQ-9 Score 3 - -      Interpretation of Total Score  Total Score Depression Severity:  1-4 = Minimal depression, 5-9 = Mild depression, 10-14 = Moderate depression, 15-19 = Moderately severe depression, 20-27 = Severe depression   Psychosocial Evaluation and Intervention:  Psychosocial Evaluation -  12/05/20 1122       Psychosocial Evaluation & Interventions   Interventions Encouraged to exercise with the program and follow exercise prescription;Relaxation education;Stress management education    Comments Christyl states that she is not depressed but is having a hard time coping with her legs being weak. She can look to her husband, two children and grand children that she can depend on.    Expected Outcomes Short: Start [facility] to help with mood. Long: Maintain a healthy mental state.    Continue Psychosocial Services  Follow up required by staff             Psychosocial Re-Evaluation:   Psychosocial Discharge (Final Psychosocial Re-Evaluation):   Education: Education Goals: Education classes will be provided on a weekly basis, covering required topics. Participant will state understanding/return demonstration of topics presented.  Learning Barriers/Preferences:  Learning Barriers/Preferences - 12/05/20 1118       Learning Barriers/Preferences   Learning Barriers None    Learning Preferences None             General Pulmonary Education Topics:  Infection Prevention: - Provides verbal and written material to individual with discussion of infection control including proper hand washing and proper equipment cleaning during exercise session. Flowsheet Row Pulmonary Rehab from 12/05/2020 in [facility] Cardiac and Pulmonary Rehab  Date 12/05/20  Educator [facility]  Instruction Review Code 1- Verbalizes Understanding       Falls Prevention: - Provides verbal and written material to individual with discussion of falls prevention and safety. Flowsheet Row Pulmonary Rehab from 12/05/2020 in [facility] Cardiac and Pulmonary Rehab  Date 12/05/20  Educator [facility]  Instruction Review Code 1- Verbalizes Understanding       Chronic Lung Disease Review: - Group verbal instruction with posters, models, PowerPoint presentations and videos,  to review new updates, new respiratory medications,  new advancements in procedures and treatments. Providing information on websites and "800" numbers for continued self-education. Includes information about supplement oxygen, available portable oxygen systems, continuous and intermittent flow rates, oxygen safety, concentrators, and Medicare reimbursement for oxygen. Explanation of Pulmonary Drugs, including class, frequency, complications, importance of spacers, rinsing mouth after steroid MDI's, and proper cleaning methods for nebulizers. Review of basic lung anatomy and physiology related to function, structure, and complications of lung disease. Review of risk factors. Discussion about methods for diagnosing sleep apnea and types of masks and machines for OSA. Includes a review of the use of types of environmental controls: home humidity, furnaces, filters, dust mite/pet prevention, HEPA vacuums. Discussion about weather changes, air quality and the benefits of nasal washing. Instruction on Warning signs, infection symptoms, calling MD promptly, preventive modes, and value of vaccinations. Review of effective airway clearance, coughing and/or vibration techniques. Emphasizing that all should Create an Action Plan. Written material given at graduation. Flowsheet Row Pulmonary Rehab from 01/10/2021 in [facility] Cardiac and Pulmonary Rehab  Education need identified 01/10/21       AED/CPR: - Group verbal and written instruction with the use of models to demonstrate the basic use of the AED with the basic ABC's of resuscitation.    Anatomy and Cardiac Procedures: - Group verbal and visual presentation and models provide information about basic cardiac anatomy and function. Reviews the testing methods done to diagnose heart disease and  the outcomes of the test results. Describes the treatment choices: Medical Management, Angioplasty, or Coronary Bypass Surgery for treating various heart conditions including Myocardial Infarction, Angina, Valve Disease, and  Cardiac Arrhythmias.  Written material given at graduation.   Medication Safety: - Group verbal and visual instruction to review commonly prescribed medications for heart and lung disease. Reviews the medication, class of the drug, and side effects. Includes the steps to properly store meds and maintain the prescription regimen.  Written material given at graduation.   Other: -Provides group and verbal instruction on various topics (see comments)   Knowledge Questionnaire Score:  Knowledge Questionnaire Score - 01/10/21 1122       Knowledge Questionnaire Score   Pre Score 16/18: Exercise, O2              Core Components/Risk Factors/Patient Goals at Admission:  Personal Goals and Risk Factors at Admission - 01/10/21 1516       Core Components/Risk Factors/Patient Goals on Admission    Weight Management Yes;Weight Loss    Intervention Weight Management: Develop a combined nutrition and exercise program designed to reach desired caloric intake, while maintaining appropriate intake of nutrient and fiber, sodium and fats, and appropriate energy expenditure required for the weight goal.;Weight Management: Provide education and appropriate resources to help participant work on and attain dietary goals.;Weight Management/Obesity: Establish reasonable short term and long term weight goals.    Admit Weight 232 lb (105.2 kg)    Goal Weight: Short Term 227 lb (103 kg)    Goal Weight: Long Term 200 lb (90.7 kg)    Expected Outcomes Short Term: Continue to assess and modify interventions until short term weight is achieved;Long Term: Adherence to nutrition and physical activity/exercise program aimed toward attainment of established weight goal;Weight Loss: Understanding of general recommendations for a balanced deficit meal plan, which promotes 1-2 lb weight loss per week and includes a negative energy balance of [phone] kcal/d;Understanding recommendations for meals to include 15-35% energy  as protein, 25-35% energy from fat, 35-60% energy from carbohydrates, less than 200mg  of dietary cholesterol, 20-35 gm of total fiber daily;Understanding of distribution of calorie intake throughout the day with the consumption of 4-5 meals/snacks    Improve shortness of breath with ADL's Yes    Intervention Provide education, individualized exercise plan and daily activity instruction to help decrease symptoms of SOB with activities of daily living.    Expected Outcomes Short Term: Improve cardiorespiratory fitness to achieve a reduction of symptoms when performing ADLs;Long Term: Be able to perform more ADLs without symptoms or delay the onset of symptoms    Heart Failure Yes    Intervention Provide a combined exercise and nutrition program that is supplemented with education, support and counseling about heart failure. Directed toward relieving symptoms such as shortness of breath, decreased exercise tolerance, and extremity edema.    Expected Outcomes Improve functional capacity of life;Short term: Attendance in program 2-3 days a week with increased exercise capacity. Reported lower sodium intake. Reported increased fruit and vegetable intake. Reports medication compliance.;Short term: Daily weights obtained and reported for increase. Utilizing diuretic protocols set by physician.;Long term: Adoption of self-care skills and reduction of barriers for early signs and symptoms recognition and intervention leading to self-care maintenance.    Hypertension Yes    Intervention Provide education on lifestyle modifcations including regular physical activity/exercise, weight management, moderate sodium restriction and increased consumption of fresh fruit, vegetables, and low fat dairy, alcohol moderation, and smoking cessation.;Monitor prescription use compliance.  Expected Outcomes Short Term: Continued assessment and intervention until BP is < 140/72mm HG in hypertensive participants. < 130/1mm HG in  hypertensive participants with diabetes, heart failure or chronic kidney disease.;Long Term: Maintenance of blood pressure at goal levels.             Education:Diabetes - Individual verbal and written instruction to review signs/symptoms of diabetes, desired ranges of glucose level fasting, after meals and with exercise. Acknowledge that pre and post exercise glucose checks will be done for 3 sessions at entry of program.   Know Your Numbers and Heart Failure: - Group verbal and visual instruction to discuss disease risk factors for cardiac and pulmonary disease and treatment options.  Reviews associated critical values for Overweight/Obesity, Hypertension, Cholesterol, and Diabetes.  Discusses basics of heart failure: signs/symptoms and treatments.  Introduces Heart Failure Zone chart for action plan for heart failure.  Written material given at graduation.   Core Components/Risk Factors/Patient Goals Review:    Core Components/Risk Factors/Patient Goals at Discharge (Final Review):    ITP Comments:  ITP Comments     Row Name 12/05/20 1115 12/05/20 1125 01/10/21 1114       ITP Comments Virtual Visit completed. Patient informed on EP and RD appointment and 6 Minute walk test. Patient also informed of patient health questionnaires on My Chart. Patient Verbalizes understanding. Visit diagnosis can be found in [facility] 11/18/2020. Patient is to call back when she gets out of her boot. She had broke her ankle from a fall and will call back in two weeks for clearance to exercise. Completed 6MWT and gym orientation. Initial ITP created and sent for review to Dr. Ottie Glazier, Medical Director.              Comments: Initial ITP

## 2021-01-17 DIAGNOSIS — M5416 Radiculopathy, lumbar region: Secondary | ICD-10-CM | POA: Diagnosis not present

## 2021-01-18 ENCOUNTER — Encounter: Payer: Self-pay | Admitting: *Deleted

## 2021-01-18 DIAGNOSIS — J449 Chronic obstructive pulmonary disease, unspecified: Secondary | ICD-10-CM

## 2021-01-18 NOTE — Progress Notes (Signed)
Pulmonary Individual Treatment Plan  Patient Details  Name: Hannah Powell MRN: [id number] Date of Birth: Sep 01, 1945 Referring Provider:   Flowsheet Row Pulmonary Rehab from 01/10/2021 in [facility] Cardiac and Pulmonary Rehab  Referring Provider Ottie Glazier MD       Initial Encounter Date:  Flowsheet Row Pulmonary Rehab from 01/10/2021 in [facility] Cardiac and Pulmonary Rehab  Date 01/10/21       Visit Diagnosis: Chronic obstructive pulmonary disease, unspecified COPD type ([place])  Patient's Home Medications on Admission:  Current Outpatient Medications:    albuterol (PROVENTIL) (2.5 MG/3ML) 0.083% nebulizer solution, Take 2.5 mg by nebulization every 6 (six) hours as needed for wheezing or shortness of breath., Disp: , Rfl:    ALPRAZolam (XANAX) 0.25 MG tablet, Take 1 tablet (0.25 mg total) by mouth 3 (three) times daily as needed for anxiety., Disp: 30 tablet, Rfl: 0   ALPRAZolam (XANAX) 0.25 MG tablet, Take 1 tablet by mouth 3 (three) times daily as needed. (Patient not taking: Reported on 12/05/2020), Disp: , Rfl:    amoxicillin (AMOXIL) 500 MG capsule, amoxicillin 500 mg capsule (Patient not taking: Reported on 12/05/2020), Disp: , Rfl:    azelastine (ASTELIN) 0.1 % nasal spray, , Disp: , Rfl:    azithromycin (ZITHROMAX) 250 MG tablet, TAKE 1 TABLET (250 MG TOTAL) BY MOUTH DAILY., Disp: 90 tablet, Rfl: 1   B Complex-C (B-COMPLEX WITH VITAMIN C) tablet, Take 1 tablet by mouth daily. , Disp: , Rfl:    calcium carbonate (OSCAL) 1500 (600 Ca) MG TABS tablet, Take by mouth., Disp: , Rfl:    Calcium Carbonate-Vitamin D (CALCIUM-VITAMIN D) 500-200 MG-UNIT per tablet, Take 1 tablet by mouth daily., Disp: , Rfl:    cyclobenzaprine (FLEXERIL) 10 MG tablet, Take 10 mg by mouth 3 (three) times daily as needed for muscle spasms.  (Patient not taking: Reported on 12/05/2020), Disp: , Rfl:    ferrous sulfate 325 (65 FE) MG tablet, Take 325 mg by mouth daily with breakfast., Disp: , Rfl:     fluticasone furoate-vilanterol (BREO ELLIPTA) 200-25 MCG/INH AEPB, Inhale 1 puff into the lungs daily. (Patient not taking: Reported on 12/05/2020), Disp: 1 each, Rfl: 0   fluticasone furoate-vilanterol (BREO ELLIPTA) 200-25 MCG/INH AEPB, Inhale 1 puff into the lungs daily., Disp: 60 each, Rfl: 3   fluticasone furoate-vilanterol (BREO ELLIPTA) 200-25 MCG/INH AEPB, Inhale into the lungs. (Patient not taking: Reported on 12/05/2020), Disp: , Rfl:    furosemide (LASIX) 20 MG tablet, Take 2 tablets (40 mg total) by mouth 2 (two) times daily. (Patient not taking: Reported on 12/05/2020), Disp: , Rfl:    furosemide (LASIX) 80 MG tablet, Take by mouth., Disp: , Rfl:    gabapentin (NEURONTIN) 300 MG capsule, Take 300 mg by mouth at bedtime.  (Patient not taking: Reported on 12/05/2020), Disp: , Rfl:    ibuprofen (ADVIL) 800 MG tablet, Take by mouth. (Patient not taking: Reported on 12/05/2020), Disp: , Rfl:    ibuprofen (ADVIL,MOTRIN) 200 MG tablet, Take 800 mg by mouth every 6 (six) hours as needed for headache or mild pain. , Disp: , Rfl:    ipratropium-albuterol (DUONEB) 0.5-2.5 (3) MG/3ML SOLN, Take 3 mLs by nebulization every 4 (four) hours as needed., Disp: , Rfl:    ketoconazole (NIZORAL) 2 % cream, Apply once daily under the breast and left under arm., Disp: 60 g, Rfl: 1   levalbuterol (XOPENEX) 0.31 MG/3ML nebulizer solution, Inhale into the lungs., Disp: , Rfl:    levothyroxine (SYNTHROID) 112 MCG  tablet, Take 112 mcg by mouth daily before breakfast. Take 2 tabs po QD (Patient not taking: Reported on 12/05/2020), Disp: , Rfl:    levothyroxine (SYNTHROID) 112 MCG tablet, Take by mouth., Disp: , Rfl:    levothyroxine (SYNTHROID, LEVOTHROID) 200 MCG tablet, Take 200 mcg by mouth daily before breakfast., Disp: , Rfl:    loratadine-pseudoephedrine (CLARITIN-D 24-HOUR) 10-240 MG 24 hr tablet, Take 1 tablet by mouth daily as needed for allergies., Disp: , Rfl:    losartan (COZAAR) 25 MG tablet, Take 25 mg by  mouth daily., Disp: , Rfl:    losartan (COZAAR) 25 MG tablet, Take 1 tablet by mouth daily. (Patient not taking: Reported on 12/05/2020), Disp: , Rfl:    Multiple Vitamins-Minerals (MULTIVITAMIN WOMEN PO), Take 1 tablet by mouth daily., Disp: , Rfl:    Omega-3 Fatty Acids (OMEGA 3 PO), Take 1 capsule by mouth daily., Disp: , Rfl:    oxybutynin (DITROPAN) 5 MG tablet, Take 5 mg by mouth 3 (three) times daily. (Patient not taking: Reported on 12/05/2020), Disp: , Rfl:    oxybutynin (DITROPAN) 5 MG tablet, Take 1 tablet by mouth 3 (three) times daily., Disp: , Rfl:    potassium chloride SA (K-DUR,KLOR-CON) 20 MEQ tablet, Take 20 mEq by mouth 2 (two) times daily.  (Patient not taking: Reported on 12/05/2020), Disp: , Rfl:    potassium chloride SA (KLOR-CON) 20 MEQ tablet, Take 1 tablet by mouth 2 (two) times daily., Disp: , Rfl:    pramipexole (MIRAPEX) 1 MG tablet, Take 4 mg by mouth at bedtime. , Disp: , Rfl:    spironolactone (ALDACTONE) 25 MG tablet, Take 25 mg by mouth 2 (two) times daily. , Disp: , Rfl:    spironolactone (ALDACTONE) 25 MG tablet, Take 1 tablet by mouth 2 (two) times daily. (Patient not taking: Reported on 12/05/2020), Disp: , Rfl:    topiramate (TOPAMAX) 100 MG tablet, Take 100 mg by mouth 2 (two) times daily., Disp: , Rfl:    topiramate (TOPAMAX) 100 MG tablet, Take by mouth. (Patient not taking: Reported on 12/05/2020), Disp: , Rfl:    traMADol (ULTRAM) 50 MG tablet, Take 1 tablet (50 mg total) by mouth every 8 (eight) hours as needed (for mild pain)., Disp: 30 tablet, Rfl: 0   traMADol (ULTRAM) 50 MG tablet, Take 1 tablet by mouth every 8 (eight) hours as needed. (Patient not taking: Reported on 12/05/2020), Disp: , Rfl:    zolpidem (AMBIEN) 10 MG tablet, Take 5 mg by mouth at bedtime as needed for sleep., Disp: , Rfl:   Past Medical History: Past Medical History:  Diagnosis Date   Anxiety    Arthritis    CHF (congestive heart failure) (HCC)    COPD (chronic obstructive  pulmonary disease) ([place])    Dysplastic nevus 04/22/2019   R abdomen parallel to sup umbilicus - mod   Dysplastic nevus 04/22/2019   R low back above waistline - mild    Edema    feet/legs   Hypertension    dr Otho Najjar      Hypothyroidism    Leg weakness, bilateral    Osteoporosis    Oxygen deficit    2l with bipap at night   RLS (restless legs syndrome)    Shortness of breath    Sleep apnea    bipap   Wheezing     Tobacco Use: Social History   Tobacco Use  Smoking Status Former   Packs/day: 1.00   Years: 10.00  Pack years: 10.00   Types: Cigarettes   Quit date: 03/20/1975   Years since quitting: 45.8  Smokeless Tobacco Never    Labs: Recent Review Flowsheet Data     Labs for ITP Cardiac and Pulmonary Rehab Latest Ref Rng & Units 07/15/2016 07/15/2016 07/17/2016 08/01/2016 11/17/2020   Trlycerides <150 mg/dL - - 142 - -   Hemoglobin A1c 4.0 - 6.0 % - - - - -   PHART 7.350 - 7.450 7.44 7.52(H) - - 7.39   PCO2ART 32.0 - 48.0 mmHg 91(HH) 71(HH) - - 55(H)   HCO3 20.0 - 28.0 mmol/L 61.8(H) 58.0(H) - 54.6(H) 33.3(H)   ACIDBASEDEF 0.0 - 2.0 mmol/L - - - - -   O2SAT % 97.2 94.9 - 67.1 90.7        Pulmonary Assessment Scores:  Pulmonary Assessment Scores     Row Name 01/10/21 1126         ADL UCSD   ADL Phase Entry     SOB Score total 11     Rest 0     Walk 1     Stairs 0  didn't complete as she stated she does not do them     Bath 0     Dress 1     Shop 1       CAT Score   CAT Score 9       mMRC Score   mMRC Score 0              UCSD: Self-administered rating of dyspnea associated with activities of daily living (ADLs) 6-point scale (0 = "not at all" to 5 = "maximal or unable to do because of breathlessness")  Scoring Scores range from 0 to 120.  Minimally important difference is 5 units  CAT: CAT can identify the health impairment of COPD patients and is better correlated with disease progression.  CAT has a scoring range of zero to 40. The CAT  score is classified into four groups of low (less than 10), medium (10 - 20), high (21-30) and very high (31-40) based on the impact level of disease on health status. A CAT score over 10 suggests significant symptoms.  A worsening CAT score could be explained by an exacerbation, poor medication adherence, poor inhaler technique, or progression of COPD or comorbid conditions.  CAT MCID is 2 points  mMRC: mMRC (Modified Medical Research Council) Dyspnea Scale is used to assess the degree of baseline functional disability in patients of respiratory disease due to dyspnea. No minimal important difference is established. A decrease in score of 1 point or greater is considered a positive change.   Pulmonary Function Assessment:  Pulmonary Function Assessment - 12/05/20 1118       Breath   Shortness of Breath Yes;Limiting activity             Exercise Target Goals: Exercise Program Goal: Individual exercise prescription set using results from initial 6 min walk test and THRR while considering  patient's activity barriers and safety.   Exercise Prescription Goal: Initial exercise prescription builds to 30-45 minutes a day of aerobic activity, 2-3 days per week.  Home exercise guidelines will be given to patient during program as part of exercise prescription that the participant will acknowledge.  Education: Aerobic Exercise: - Group verbal and visual presentation on the components of exercise prescription. Introduces F.I.T.T principle from ACSM for exercise prescriptions.  Reviews F.I.T.T. principles of aerobic exercise including progression. Written material given at graduation. Flowsheet  Row Pulmonary Rehab from 01/10/2021 in [facility] Cardiac and Pulmonary Rehab  Education need identified 01/10/21       Education: Resistance Exercise: - Group verbal and visual presentation on the components of exercise prescription. Introduces F.I.T.T principle from ACSM for exercise prescriptions   Reviews F.I.T.T. principles of resistance exercise including progression. Written material given at graduation.    Education: Exercise & Equipment Safety: - Individual verbal instruction and demonstration of equipment use and safety with use of the equipment. Flowsheet Row Pulmonary Rehab from 12/05/2020 in [facility] Cardiac and Pulmonary Rehab  Date 12/05/20  Educator [facility]  Instruction Review Code 1- Verbalizes Understanding       Education: Exercise Physiology & General Exercise Guidelines: - Group verbal and written instruction with models to review the exercise physiology of the cardiovascular system and associated critical values. Provides general exercise guidelines with specific guidelines to those with heart or lung disease.    Education: Flexibility, Balance, Mind/Body Relaxation: - Group verbal and visual presentation with interactive activity on the components of exercise prescription. Introduces F.I.T.T principle from ACSM for exercise prescriptions. Reviews F.I.T.T. principles of flexibility and balance exercise training including progression. Also discusses the mind body connection.  Reviews various relaxation techniques to help reduce and manage stress (i.e. Deep breathing, progressive muscle relaxation, and visualization). Balance handout provided to take home. Written material given at graduation.   Activity Barriers & Risk Stratification:   6 Minute Walk:  6 Minute Walk     Row Name 01/10/21 1142         6 Minute Walk   Phase Initial     Distance 165 feet     Walk Time 0 minutes     # of Rest Breaks 0     MPH 0.31     METS 0.17     RPE 13     Perceived Dyspnea  1     VO2 Peak 0.62     Symptoms Yes (comment)     Comments Legs fatigued     Resting HR 72 bpm     Resting BP 124/78     Resting Oxygen Saturation  95 %     Exercise Oxygen Saturation  during 6 min walk 85 %     Max Ex. HR 106 bpm     Max Ex. BP 152/76     2 Minute Post BP 130/76       Interval HR    1 Minute HR 93     2 Minute HR 97     3 Minute HR 98     4 Minute HR 106     5 Minute HR 105     6 Minute HR 100     2 Minute Post HR 75     Interval Heart Rate? Yes       Interval Oxygen   Interval Oxygen? Yes     Baseline Oxygen Saturation % 95 %     1 Minute Oxygen Saturation % 90 %     1 Minute Liters of Oxygen 0 L  RA     2 Minute Oxygen Saturation % 87 %     2 Minute Liters of Oxygen 0 L     3 Minute Oxygen Saturation % 88 %     3 Minute Liters of Oxygen 0 L     4 Minute Oxygen Saturation % 89 %     4 Minute Liters of Oxygen 0 L     5  Minute Oxygen Saturation % 89 %     5 Minute Liters of Oxygen 0 L     6 Minute Oxygen Saturation % 85 %     6 Minute Liters of Oxygen 0 L     2 Minute Post Oxygen Saturation % 93 %     2 Minute Post Liters of Oxygen 0 L             Oxygen Initial Assessment:  Oxygen Initial Assessment - 12/05/20 1116       Home Oxygen   Home Oxygen Device Home Concentrator;Portable Concentrator    Sleep Oxygen Prescription BiPAP    Liters per minute 2    Home Exercise Oxygen Prescription None    Home Resting Oxygen Prescription None    Compliance with Home Oxygen Use Yes      Initial 6 min Walk   Oxygen Used None      Program Oxygen Prescription   Program Oxygen Prescription None      Intervention   Short Term Goals To learn and exhibit compliance with exercise, home and travel O2 prescription;To learn and understand importance of monitoring SPO2 with pulse oximeter and demonstrate accurate use of the pulse oximeter.;To learn and understand importance of maintaining oxygen saturations>88%;To learn and demonstrate proper pursed lip breathing techniques or other breathing techniques. ;To learn and demonstrate proper use of respiratory medications    Long  Term Goals Exhibits compliance with exercise, home  and travel O2 prescription;Verbalizes importance of monitoring SPO2 with pulse oximeter and return demonstration;Maintenance of O2  saturations>88%;Exhibits proper breathing techniques, such as pursed lip breathing or other method taught during program session;Compliance with respiratory medication;Demonstrates proper use of MDI's             Oxygen Re-Evaluation:   Oxygen Discharge (Final Oxygen Re-Evaluation):   Initial Exercise Prescription:  Initial Exercise Prescription - 01/10/21 1500       Date of Initial Exercise RX and Referring Provider   Date 01/10/21    Referring Provider Ottie Glazier MD      Oxygen   Maintain Oxygen Saturation 88% or higher      Recumbant Bike   Level 1    RPM 60    Minutes 15    METs 1      NuStep   Level 1    SPM 80    Minutes 15    METs 1      REL-XR   Level 1    Speed 50    Minutes 15    METs 1      Track   Laps 2   as tolerated   Minutes 15    METs 1      Prescription Details   Frequency (times per week) 2    Duration Progress to 30 minutes of continuous aerobic without signs/symptoms of physical distress      Intensity   THRR 40-80% of Max Heartrate 101-131    Ratings of Perceived Exertion 11-13    Perceived Dyspnea 0-4      Progression   Progression Continue to progress workloads to maintain intensity without signs/symptoms of physical distress.      Resistance Training   Training Prescription Yes    Weight 3 lb    Reps 10-15             Perform Capillary Blood Glucose checks as needed.  Exercise Prescription Changes:   Exercise Prescription Changes     Row Name 01/10/21  1500             Response to Exercise   Blood Pressure (Admit) 124/78       Blood Pressure (Exercise) 152/76       Blood Pressure (Exit) 132/78       Heart Rate (Admit) 72 bpm       Heart Rate (Exercise) 106 bpm       Heart Rate (Exit) 75 bpm       Oxygen Saturation (Admit) 95 %       Oxygen Saturation (Exercise) 85 %       Oxygen Saturation (Exit) 94 %       Rating of Perceived Exertion (Exercise) 13       Perceived Dyspnea (Exercise) 1        Symptoms Legs fatigued       Comments walk test results                Exercise Comments:   Exercise Goals and Review:   Exercise Goals     Row Name 01/10/21 1515             Exercise Goals   Increase Physical Activity Yes       Intervention Provide advice, education, support and counseling about physical activity/exercise needs.;Develop an individualized exercise prescription for aerobic and resistive training based on initial evaluation findings, risk stratification, comorbidities and participant's personal goals.       Expected Outcomes Short Term: Attend rehab on a regular basis to increase amount of physical activity.;Long Term: Add in home exercise to make exercise part of routine and to increase amount of physical activity.;Long Term: Exercising regularly at least 3-5 days a week.       Increase Strength and Stamina Yes       Intervention Provide advice, education, support and counseling about physical activity/exercise needs.;Develop an individualized exercise prescription for aerobic and resistive training based on initial evaluation findings, risk stratification, comorbidities and participant's personal goals.       Expected Outcomes Short Term: Increase workloads from initial exercise prescription for resistance, speed, and METs.;Short Term: Perform resistance training exercises routinely during rehab and add in resistance training at home;Long Term: Improve cardiorespiratory fitness, muscular endurance and strength as measured by increased METs and functional capacity (6MWT)       Able to understand and use rate of perceived exertion (RPE) scale Yes       Intervention Provide education and explanation on how to use RPE scale       Expected Outcomes Short Term: Able to use RPE daily in rehab to express subjective intensity level;Long Term:  Able to use RPE to guide intensity level when exercising independently       Able to understand and use Dyspnea scale Yes        Intervention Provide education and explanation on how to use Dyspnea scale       Expected Outcomes Short Term: Able to use Dyspnea scale daily in rehab to express subjective sense of shortness of breath during exertion;Long Term: Able to use Dyspnea scale to guide intensity level when exercising independently       Knowledge and understanding of Target Heart Rate Range (THRR) Yes       Intervention Provide education and explanation of THRR including how the numbers were predicted and where they are located for reference       Expected Outcomes Short Term: Able to state/look up THRR;Long Term: Able to use THRR to  govern intensity when exercising independently;Short Term: Able to use daily as guideline for intensity in rehab       Able to check pulse independently Yes       Intervention Provide education and demonstration on how to check pulse in carotid and radial arteries.;Review the importance of being able to check your own pulse for safety during independent exercise       Expected Outcomes Short Term: Able to explain why pulse checking is important during independent exercise;Long Term: Able to check pulse independently and accurately       Understanding of Exercise Prescription Yes       Intervention Provide education, explanation, and written materials on patient's individual exercise prescription       Expected Outcomes Short Term: Able to explain program exercise prescription;Long Term: Able to explain home exercise prescription to exercise independently                Exercise Goals Re-Evaluation :   Discharge Exercise Prescription (Final Exercise Prescription Changes):  Exercise Prescription Changes - 01/10/21 1500       Response to Exercise   Blood Pressure (Admit) 124/78    Blood Pressure (Exercise) 152/76    Blood Pressure (Exit) 132/78    Heart Rate (Admit) 72 bpm    Heart Rate (Exercise) 106 bpm    Heart Rate (Exit) 75 bpm    Oxygen Saturation (Admit) 95 %    Oxygen  Saturation (Exercise) 85 %    Oxygen Saturation (Exit) 94 %    Rating of Perceived Exertion (Exercise) 13    Perceived Dyspnea (Exercise) 1    Symptoms Legs fatigued    Comments walk test results             Nutrition:  Target Goals: Understanding of nutrition guidelines, daily intake of sodium 1500mg , cholesterol 200mg , calories 30% from fat and 7% or less from saturated fats, daily to have 5 or more servings of fruits and vegetables.  Education: All About Nutrition: -Group instruction provided by verbal, written material, interactive activities, discussions, models, and posters to present general guidelines for heart healthy nutrition including fat, fiber, MyPlate, the role of sodium in heart healthy nutrition, utilization of the nutrition label, and utilization of this knowledge for meal planning. Follow up email sent as well. Written material given at graduation.   Biometrics:    Nutrition Therapy Plan and Nutrition Goals:  Nutrition Therapy & Goals - 01/10/21 1516       Intervention Plan   Intervention Prescribe, educate and counsel regarding individualized specific dietary modifications aiming towards targeted core components such as weight, hypertension, lipid management, diabetes, heart failure and other comorbidities.    Expected Outcomes Short Term Goal: Understand basic principles of dietary content, such as calories, fat, sodium, cholesterol and nutrients.;Short Term Goal: A plan has been developed with personal nutrition goals set during dietitian appointment.;Long Term Goal: Adherence to prescribed nutrition plan.             Nutrition Assessments:  MEDIFICTS Score Key: ?70 Need to make dietary changes  40-70 Heart Healthy Diet ? 40 Therapeutic Level Cholesterol Diet  Flowsheet Row Pulmonary Rehab from 01/10/2021 in [facility] Cardiac and Pulmonary Rehab  Picture Your Plate Total Score on Admission 76      Picture Your Plate Scores: <62 Unhealthy dietary  pattern with much room for improvement. 41-50 Dietary pattern unlikely to meet recommendations for good health and room for improvement. 51-60 More healthful dietary pattern, with some room  for improvement.  >60 Healthy dietary pattern, although there may be some specific behaviors that could be improved.   Nutrition Goals Re-Evaluation:   Nutrition Goals Discharge (Final Nutrition Goals Re-Evaluation):   Psychosocial: Target Goals: Acknowledge presence or absence of significant depression and/or stress, maximize coping skills, provide positive support system. Participant is able to verbalize types and ability to use techniques and skills needed for reducing stress and depression.   Education: Stress, Anxiety, and Depression - Group verbal and visual presentation to define topics covered.  Reviews how body is impacted by stress, anxiety, and depression.  Also discusses healthy ways to reduce stress and to treat/manage anxiety and depression.  Written material given at graduation.   Education: Sleep Hygiene -Provides group verbal and written instruction about how sleep can affect your health.  Define sleep hygiene, discuss sleep cycles and impact of sleep habits. Review good sleep hygiene tips.    Initial Review & Psychosocial Screening:  Initial Psych Review & Screening - 12/05/20 1119       Initial Review   Current issues with Current Sleep Concerns;Current Stress Concerns    Source of Stress Concerns Chronic Illness;Unable to participate in former interests or hobbies    Comments Preeti states that she is not depressed but is having a hard time coping with her legs being weak. She can look to her husband, two children and grand children that she can depend on.      Family Dynamics   Good Support System? Yes      Barriers   Psychosocial barriers to participate in program The patient should benefit from training in stress management and relaxation.      Screening Interventions    Interventions To provide support and resources with identified psychosocial needs;Provide feedback about the scores to participant;Encouraged to exercise    Expected Outcomes Short Term goal: Utilizing psychosocial counselor, staff and physician to assist with identification of specific Stressors or current issues interfering with healing process. Setting desired goal for each stressor or current issue identified.;Long Term Goal: Stressors or current issues are controlled or eliminated.;Short Term goal: Identification and review with participant of any Quality of Life or Depression concerns found by scoring the questionnaire.;Long Term goal: The participant improves quality of Life and PHQ9 Scores as seen by post scores and/or verbalization of changes             Quality of Life Scores:  Scores of 19 and below usually indicate a poorer quality of life in these areas.  A difference of  2-3 points is a clinically meaningful difference.  A difference of 2-3 points in the total score of the Quality of Life Index has been associated with significant improvement in overall quality of life, self-image, physical symptoms, and general health in studies assessing change in quality of life.  PHQ-9: Recent Review Flowsheet Data     Depression screen [facility] 2/9 01/10/2021 10/01/2016 08/09/2016   Decreased Interest 0 0 0   Down, Depressed, Hopeless 0 0 0   PHQ - 2 Score 0 0 0   Altered sleeping 1 - -   Tired, decreased energy 1 - -   Change in appetite 0 - -   Feeling bad or failure about yourself  1 - -   Trouble concentrating 0 - -   Moving slowly or fidgety/restless 0 - -   Suicidal thoughts 0 - -   PHQ-9 Score 3 - -      Interpretation of Total Score  Total Score Depression Severity:  1-4 = Minimal depression, 5-9 = Mild depression, 10-14 = Moderate depression, 15-19 = Moderately severe depression, 20-27 = Severe depression   Psychosocial Evaluation and Intervention:  Psychosocial Evaluation -  12/05/20 1122       Psychosocial Evaluation & Interventions   Interventions Encouraged to exercise with the program and follow exercise prescription;Relaxation education;Stress management education    Comments Aylee states that she is not depressed but is having a hard time coping with her legs being weak. She can look to her husband, two children and grand children that she can depend on.    Expected Outcomes Short: Start [facility] to help with mood. Long: Maintain a healthy mental state.    Continue Psychosocial Services  Follow up required by staff             Psychosocial Re-Evaluation:   Psychosocial Discharge (Final Psychosocial Re-Evaluation):   Education: Education Goals: Education classes will be provided on a weekly basis, covering required topics. Participant will state understanding/return demonstration of topics presented.  Learning Barriers/Preferences:  Learning Barriers/Preferences - 12/05/20 1118       Learning Barriers/Preferences   Learning Barriers None    Learning Preferences None             General Pulmonary Education Topics:  Infection Prevention: - Provides verbal and written material to individual with discussion of infection control including proper hand washing and proper equipment cleaning during exercise session. Flowsheet Row Pulmonary Rehab from 12/05/2020 in [facility] Cardiac and Pulmonary Rehab  Date 12/05/20  Educator [facility]  Instruction Review Code 1- Verbalizes Understanding       Falls Prevention: - Provides verbal and written material to individual with discussion of falls prevention and safety. Flowsheet Row Pulmonary Rehab from 12/05/2020 in [facility] Cardiac and Pulmonary Rehab  Date 12/05/20  Educator [facility]  Instruction Review Code 1- Verbalizes Understanding       Chronic Lung Disease Review: - Group verbal instruction with posters, models, PowerPoint presentations and videos,  to review new updates, new respiratory medications,  new advancements in procedures and treatments. Providing information on websites and "800" numbers for continued self-education. Includes information about supplement oxygen, available portable oxygen systems, continuous and intermittent flow rates, oxygen safety, concentrators, and Medicare reimbursement for oxygen. Explanation of Pulmonary Drugs, including class, frequency, complications, importance of spacers, rinsing mouth after steroid MDI's, and proper cleaning methods for nebulizers. Review of basic lung anatomy and physiology related to function, structure, and complications of lung disease. Review of risk factors. Discussion about methods for diagnosing sleep apnea and types of masks and machines for OSA. Includes a review of the use of types of environmental controls: home humidity, furnaces, filters, dust mite/pet prevention, HEPA vacuums. Discussion about weather changes, air quality and the benefits of nasal washing. Instruction on Warning signs, infection symptoms, calling MD promptly, preventive modes, and value of vaccinations. Review of effective airway clearance, coughing and/or vibration techniques. Emphasizing that all should Create an Action Plan. Written material given at graduation. Flowsheet Row Pulmonary Rehab from 01/10/2021 in [facility] Cardiac and Pulmonary Rehab  Education need identified 01/10/21       AED/CPR: - Group verbal and written instruction with the use of models to demonstrate the basic use of the AED with the basic ABC's of resuscitation.    Anatomy and Cardiac Procedures: - Group verbal and visual presentation and models provide information about basic cardiac anatomy and function. Reviews the testing methods done to diagnose heart disease and  the outcomes of the test results. Describes the treatment choices: Medical Management, Angioplasty, or Coronary Bypass Surgery for treating various heart conditions including Myocardial Infarction, Angina, Valve Disease, and  Cardiac Arrhythmias.  Written material given at graduation.   Medication Safety: - Group verbal and visual instruction to review commonly prescribed medications for heart and lung disease. Reviews the medication, class of the drug, and side effects. Includes the steps to properly store meds and maintain the prescription regimen.  Written material given at graduation.   Other: -Provides group and verbal instruction on various topics (see comments)   Knowledge Questionnaire Score:  Knowledge Questionnaire Score - 01/10/21 1122       Knowledge Questionnaire Score   Pre Score 16/18: Exercise, O2              Core Components/Risk Factors/Patient Goals at Admission:  Personal Goals and Risk Factors at Admission - 01/10/21 1516       Core Components/Risk Factors/Patient Goals on Admission    Weight Management Yes;Weight Loss    Intervention Weight Management: Develop a combined nutrition and exercise program designed to reach desired caloric intake, while maintaining appropriate intake of nutrient and fiber, sodium and fats, and appropriate energy expenditure required for the weight goal.;Weight Management: Provide education and appropriate resources to help participant work on and attain dietary goals.;Weight Management/Obesity: Establish reasonable short term and long term weight goals.    Admit Weight 232 lb (105.2 kg)    Goal Weight: Short Term 227 lb (103 kg)    Goal Weight: Long Term 200 lb (90.7 kg)    Expected Outcomes Short Term: Continue to assess and modify interventions until short term weight is achieved;Long Term: Adherence to nutrition and physical activity/exercise program aimed toward attainment of established weight goal;Weight Loss: Understanding of general recommendations for a balanced deficit meal plan, which promotes 1-2 lb weight loss per week and includes a negative energy balance of [phone] kcal/d;Understanding recommendations for meals to include 15-35% energy  as protein, 25-35% energy from fat, 35-60% energy from carbohydrates, less than 200mg  of dietary cholesterol, 20-35 gm of total fiber daily;Understanding of distribution of calorie intake throughout the day with the consumption of 4-5 meals/snacks    Improve shortness of breath with ADL's Yes    Intervention Provide education, individualized exercise plan and daily activity instruction to help decrease symptoms of SOB with activities of daily living.    Expected Outcomes Short Term: Improve cardiorespiratory fitness to achieve a reduction of symptoms when performing ADLs;Long Term: Be able to perform more ADLs without symptoms or delay the onset of symptoms    Heart Failure Yes    Intervention Provide a combined exercise and nutrition program that is supplemented with education, support and counseling about heart failure. Directed toward relieving symptoms such as shortness of breath, decreased exercise tolerance, and extremity edema.    Expected Outcomes Improve functional capacity of life;Short term: Attendance in program 2-3 days a week with increased exercise capacity. Reported lower sodium intake. Reported increased fruit and vegetable intake. Reports medication compliance.;Short term: Daily weights obtained and reported for increase. Utilizing diuretic protocols set by physician.;Long term: Adoption of self-care skills and reduction of barriers for early signs and symptoms recognition and intervention leading to self-care maintenance.    Hypertension Yes    Intervention Provide education on lifestyle modifcations including regular physical activity/exercise, weight management, moderate sodium restriction and increased consumption of fresh fruit, vegetables, and low fat dairy, alcohol moderation, and smoking cessation.;Monitor prescription use compliance.  Expected Outcomes Short Term: Continued assessment and intervention until BP is < 140/103mm HG in hypertensive participants. < 130/59mm HG in  hypertensive participants with diabetes, heart failure or chronic kidney disease.;Long Term: Maintenance of blood pressure at goal levels.             Education:Diabetes - Individual verbal and written instruction to review signs/symptoms of diabetes, desired ranges of glucose level fasting, after meals and with exercise. Acknowledge that pre and post exercise glucose checks will be done for 3 sessions at entry of program.   Know Your Numbers and Heart Failure: - Group verbal and visual instruction to discuss disease risk factors for cardiac and pulmonary disease and treatment options.  Reviews associated critical values for Overweight/Obesity, Hypertension, Cholesterol, and Diabetes.  Discusses basics of heart failure: signs/symptoms and treatments.  Introduces Heart Failure Zone chart for action plan for heart failure.  Written material given at graduation.   Core Components/Risk Factors/Patient Goals Review:    Core Components/Risk Factors/Patient Goals at Discharge (Final Review):    ITP Comments:  ITP Comments     Row Name 12/05/20 1115 12/05/20 1125 01/10/21 1114 01/18/21 0648     ITP Comments Virtual Visit completed. Patient informed on EP and RD appointment and 6 Minute walk test. Patient also informed of patient health questionnaires on My Chart. Patient Verbalizes understanding. Visit diagnosis can be found in [facility] 11/18/2020. Patient is to call back when she gets out of her boot. She had broke her ankle from a fall and will call back in two weeks for clearance to exercise. Completed 6MWT and gym orientation. Initial ITP created and sent for review to Dr. Ottie Glazier, Medical Director. 30 Day review completed. Medical Director ITP review done, changes made as directed, and signed approval by Medical Director.   New to program             Comments:  30 Day review completed. Medical Director ITP review done, changes made as directed, and signed approval by Medical  Director.

## 2021-01-23 ENCOUNTER — Other Ambulatory Visit: Payer: Self-pay

## 2021-01-23 ENCOUNTER — Encounter: Payer: Medicare HMO | Attending: Pulmonary Disease

## 2021-01-23 DIAGNOSIS — J449 Chronic obstructive pulmonary disease, unspecified: Secondary | ICD-10-CM | POA: Diagnosis not present

## 2021-01-23 NOTE — Progress Notes (Signed)
Daily Session Note  Patient Details  Name: Hannah Powell MRN: [id number] Date of Birth: June 12, 1945 Referring Provider:   Flowsheet Row Pulmonary Rehab from 01/10/2021 in [facility] Cardiac and Pulmonary Rehab  Referring Provider Ottie Glazier MD       Encounter Date: 01/23/2021  Check In:  Session Check In - 01/23/21 1353       Check-In   Supervising physician immediately available to respond to emergencies See telemetry face sheet for immediately available ER MD    Location [facility]-Cardiac & Pulmonary Rehab    Staff Present Birdie Sons, MPA, Nino Glow, MS, ASCM CEP, Exercise Physiologist;Joseph Tessie Fass, [place]    Medication changes reported     No    Fall or balance concerns reported    No    Warm-up and Cool-down Performed on first and last piece of equipment    Resistance Training Performed Yes    VAD Patient? No    PAD/SET Patient? No      Pain Assessment   Currently in Pain? No/denies                Social History   Tobacco Use  Smoking Status Former   Packs/day: 1.00   Years: 10.00   Pack years: 10.00   Types: Cigarettes   Quit date: 03/20/1975   Years since quitting: 45.8  Smokeless Tobacco Never    Goals Met:  Independence with exercise equipment Exercise tolerated well No report of concerns or symptoms today Strength training completed today  Goals Unmet:  Not Applicable  Comments: First full day of exercise!  Patient was oriented to gym and equipment including functions, settings, policies, and procedures.  Patient's individual exercise prescription and treatment plan were reviewed.  All starting workloads were established based on the results of the 6 minute walk test done at initial orientation visit.  The plan for exercise progression was also introduced and progression will be customized based on patient's performance and goals.    Dr. Emily Filbert is Medical Director for [place].  Dr. Ottie Glazier is  Medical Director for [facility] Pulmonary Rehabilitation.

## 2021-01-25 ENCOUNTER — Other Ambulatory Visit: Payer: Self-pay

## 2021-01-25 DIAGNOSIS — J449 Chronic obstructive pulmonary disease, unspecified: Secondary | ICD-10-CM | POA: Diagnosis not present

## 2021-01-25 NOTE — Progress Notes (Signed)
Daily Session Note  Patient Details  Name: Hannah Powell MRN: [id number] Date of Birth: 1945/07/16 Referring Provider:   Flowsheet Row Pulmonary Rehab from 01/10/2021 in [facility] Cardiac and Pulmonary Rehab  Referring Provider Ottie Glazier MD       Encounter Date: 01/25/2021  Check In:  Session Check In - 01/25/21 1325       Check-In   Supervising physician immediately available to respond to emergencies See telemetry face sheet for immediately available ER MD    Location [facility]-Cardiac & Pulmonary Rehab    Staff Present Birdie Sons, MPA, Nino Glow, MS, ASCM CEP, Exercise Physiologist;Joseph Tessie Fass, [place]    Virtual Visit No    Medication changes reported     No    Fall or balance concerns reported    No    Warm-up and Cool-down Performed on first and last piece of equipment    Resistance Training Performed Yes    VAD Patient? No    PAD/SET Patient? No      Pain Assessment   Currently in Pain? No/denies                Social History   Tobacco Use  Smoking Status Former   Packs/day: 1.00   Years: 10.00   Pack years: 10.00   Types: Cigarettes   Quit date: 03/20/1975   Years since quitting: 45.8  Smokeless Tobacco Never    Goals Met:  Independence with exercise equipment Exercise tolerated well No report of concerns or symptoms today Strength training completed today  Goals Unmet:  Not Applicable  Comments: Pt able to follow exercise prescription today without complaint.  Will continue to monitor for progression.    Dr. Emily Filbert is Medical Director for [place].  Dr. Ottie Glazier is Medical Director for [facility] Pulmonary Rehabilitation.

## 2021-01-30 ENCOUNTER — Other Ambulatory Visit: Payer: Self-pay

## 2021-01-30 DIAGNOSIS — J449 Chronic obstructive pulmonary disease, unspecified: Secondary | ICD-10-CM | POA: Diagnosis not present

## 2021-01-30 NOTE — Progress Notes (Signed)
Daily Session Note  Patient Details  Name: Hannah Powell MRN: [id number] Date of Birth: 1945-12-28 Referring Provider:   Flowsheet Row Pulmonary Rehab from 01/10/2021 in [facility] Cardiac and Pulmonary Rehab  Referring Provider Ottie Glazier MD       Encounter Date: 01/30/2021  Check In:  Session Check In - 01/30/21 1328       Check-In   Supervising physician immediately available to respond to emergencies See telemetry face sheet for immediately available ER MD    Location [facility]-Cardiac & Pulmonary Rehab    Staff Present Birdie Sons, MPA, Nino Glow, MS, ASCM CEP, Exercise Physiologist;Joseph Tessie Fass, [place]    Virtual Visit No    Medication changes reported     No    Fall or balance concerns reported    No    Warm-up and Cool-down Performed on first and last piece of equipment    Resistance Training Performed Yes    VAD Patient? No    PAD/SET Patient? No      Pain Assessment   Currently in Pain? No/denies                Social History   Tobacco Use  Smoking Status Former   Packs/day: 1.00   Years: 10.00   Pack years: 10.00   Types: Cigarettes   Quit date: 03/20/1975   Years since quitting: 45.8  Smokeless Tobacco Never    Goals Met:  Independence with exercise equipment Exercise tolerated well No report of concerns or symptoms today Strength training completed today  Goals Unmet:  Not Applicable  Comments: Pt able to follow exercise prescription today without complaint.  Will continue to monitor for progression.    Dr. Emily Filbert is Medical Director for [place].  Dr. Ottie Glazier is Medical Director for [facility] Pulmonary Rehabilitation.

## 2021-02-01 ENCOUNTER — Other Ambulatory Visit: Payer: Self-pay

## 2021-02-01 DIAGNOSIS — J449 Chronic obstructive pulmonary disease, unspecified: Secondary | ICD-10-CM

## 2021-02-01 NOTE — Progress Notes (Signed)
Daily Session Note  Patient Details  Name: Hannah Powell MRN: [id number] Date of Birth: 08-14-1945 Referring Provider:   Flowsheet Row Pulmonary Rehab from 01/10/2021 in [facility] Cardiac and Pulmonary Rehab  Referring Provider Ottie Glazier MD       Encounter Date: 02/01/2021  Check In:  Session Check In - 02/01/21 1343       Check-In   Supervising physician immediately available to respond to emergencies See telemetry face sheet for immediately available ER MD    Location [facility]-Cardiac & Pulmonary Rehab    Staff Present Birdie Sons, MPA, Nino Glow, MS, ASCM CEP, Exercise Physiologist;Meredith Sherryll Burger, RN BSN    Virtual Visit No    Medication changes reported     No    Fall or balance concerns reported    No    Warm-up and Cool-down Performed on first and last piece of equipment    Resistance Training Performed Yes    VAD Patient? No    PAD/SET Patient? No      Pain Assessment   Currently in Pain? No/denies                Social History   Tobacco Use  Smoking Status Former   Packs/day: 1.00   Years: 10.00   Pack years: 10.00   Types: Cigarettes   Quit date: 03/20/1975   Years since quitting: 45.9  Smokeless Tobacco Never    Goals Met:  Independence with exercise equipment Exercise tolerated well Personal goals reviewed No report of concerns or symptoms today Strength training completed today  Goals Unmet:  Not Applicable  Comments: Pt able to follow exercise prescription today without complaint.  Will continue to monitor for progression.    Dr. Emily Filbert is Medical Director for [place].  Dr. Ottie Glazier is Medical Director for [facility] Pulmonary Rehabilitation.

## 2021-02-02 ENCOUNTER — Other Ambulatory Visit: Payer: Self-pay

## 2021-02-02 ENCOUNTER — Encounter: Payer: Medicare HMO | Admitting: *Deleted

## 2021-02-02 DIAGNOSIS — J449 Chronic obstructive pulmonary disease, unspecified: Secondary | ICD-10-CM

## 2021-02-02 NOTE — Progress Notes (Signed)
Daily Session Note  Patient Details  Name: Hannah Powell MRN: [id number] Date of Birth: July 01, 1945 Referring Provider:   Flowsheet Row Pulmonary Rehab from 01/10/2021 in [facility] Cardiac and Pulmonary Rehab  Referring Provider Ottie Glazier MD       Encounter Date: 02/02/2021  Check In:  Session Check In - 02/02/21 1324       Check-In   Supervising physician immediately available to respond to emergencies See telemetry face sheet for immediately available ER MD    Location [facility]-Cardiac & Pulmonary Rehab    Staff Present Renita Papa, RN BSN;Joseph [place], RCP,RRT,BSRT;Jessica Laura, [place], RCEP, CCRP, CCET    Virtual Visit No    Medication changes reported     No    Fall or balance concerns reported    No    Warm-up and Cool-down Performed on first and last piece of equipment    Resistance Training Performed Yes    VAD Patient? No    PAD/SET Patient? No      Pain Assessment   Currently in Pain? No/denies                Social History   Tobacco Use  Smoking Status Former   Packs/day: 1.00   Years: 10.00   Pack years: 10.00   Types: Cigarettes   Quit date: 03/20/1975   Years since quitting: 45.9  Smokeless Tobacco Never    Goals Met:  Independence with exercise equipment Exercise tolerated well No report of concerns or symptoms today Strength training completed today  Goals Unmet:  Not Applicable  Comments: Pt able to follow exercise prescription today without complaint.  Will continue to monitor for progression.    Dr. Emily Filbert is Medical Director for [place].  Dr. Ottie Glazier is Medical Director for [facility] Pulmonary Rehabilitation.

## 2021-02-06 ENCOUNTER — Other Ambulatory Visit: Payer: Self-pay

## 2021-02-06 DIAGNOSIS — J449 Chronic obstructive pulmonary disease, unspecified: Secondary | ICD-10-CM

## 2021-02-06 NOTE — Progress Notes (Signed)
Daily Session Note  Patient Details  Name: Hannah Powell MRN: [id number] Date of Birth: 1945/12/19 Referring Provider:   Flowsheet Row Pulmonary Rehab from 01/10/2021 in [facility] Cardiac and Pulmonary Rehab  Referring Provider Ottie Glazier MD       Encounter Date: 02/06/2021  Check In:  Session Check In - 02/06/21 1334       Check-In   Supervising physician immediately available to respond to emergencies See telemetry face sheet for immediately available ER MD    Location [facility]-Cardiac & Pulmonary Rehab    Staff Present Birdie Sons, MPA, Nino Glow, MS, ASCM CEP, Exercise Physiologist;Joseph Tessie Fass, [place]    Virtual Visit No    Medication changes reported     No    Fall or balance concerns reported    No    Warm-up and Cool-down Performed on first and last piece of equipment    Resistance Training Performed Yes    VAD Patient? No    PAD/SET Patient? No      Pain Assessment   Currently in Pain? No/denies                Social History   Tobacco Use  Smoking Status Former   Packs/day: 1.00   Years: 10.00   Pack years: 10.00   Types: Cigarettes   Quit date: 03/20/1975   Years since quitting: 45.9  Smokeless Tobacco Never    Goals Met:  Independence with exercise equipment Exercise tolerated well No report of concerns or symptoms today Strength training completed today  Goals Unmet:  Not Applicable  Comments: Pt able to follow exercise prescription today without complaint.  Will continue to monitor for progression.    Dr. Emily Filbert is Medical Director for [place].  Dr. Ottie Glazier is Medical Director for [facility] Pulmonary Rehabilitation.

## 2021-02-15 ENCOUNTER — Encounter: Payer: Self-pay | Admitting: *Deleted

## 2021-02-15 ENCOUNTER — Other Ambulatory Visit: Payer: Self-pay

## 2021-02-15 DIAGNOSIS — R29898 Other symptoms and signs involving the musculoskeletal system: Secondary | ICD-10-CM | POA: Diagnosis not present

## 2021-02-15 DIAGNOSIS — J449 Chronic obstructive pulmonary disease, unspecified: Secondary | ICD-10-CM

## 2021-02-15 DIAGNOSIS — M48061 Spinal stenosis, lumbar region without neurogenic claudication: Secondary | ICD-10-CM | POA: Diagnosis not present

## 2021-02-15 DIAGNOSIS — M5416 Radiculopathy, lumbar region: Secondary | ICD-10-CM | POA: Diagnosis not present

## 2021-02-15 NOTE — Progress Notes (Signed)
Pulmonary Individual Treatment Plan  Patient Details  Name: Hannah Powell MRN: [id number] Date of Birth: Sep 01, 1945 Referring Provider:   Flowsheet Row Pulmonary Rehab from 01/10/2021 in [facility] Cardiac and Pulmonary Rehab  Referring Provider Ottie Glazier MD       Initial Encounter Date:  Flowsheet Row Pulmonary Rehab from 01/10/2021 in [facility] Cardiac and Pulmonary Rehab  Date 01/10/21       Visit Diagnosis: Chronic obstructive pulmonary disease, unspecified COPD type ([place])  Patient's Home Medications on Admission:  Current Outpatient Medications:    albuterol (PROVENTIL) (2.5 MG/3ML) 0.083% nebulizer solution, Take 2.5 mg by nebulization every 6 (six) hours as needed for wheezing or shortness of breath., Disp: , Rfl:    ALPRAZolam (XANAX) 0.25 MG tablet, Take 1 tablet (0.25 mg total) by mouth 3 (three) times daily as needed for anxiety., Disp: 30 tablet, Rfl: 0   ALPRAZolam (XANAX) 0.25 MG tablet, Take 1 tablet by mouth 3 (three) times daily as needed. (Patient not taking: Reported on 12/05/2020), Disp: , Rfl:    amoxicillin (AMOXIL) 500 MG capsule, amoxicillin 500 mg capsule (Patient not taking: Reported on 12/05/2020), Disp: , Rfl:    azelastine (ASTELIN) 0.1 % nasal spray, , Disp: , Rfl:    azithromycin (ZITHROMAX) 250 MG tablet, TAKE 1 TABLET (250 MG TOTAL) BY MOUTH DAILY., Disp: 90 tablet, Rfl: 1   B Complex-C (B-COMPLEX WITH VITAMIN C) tablet, Take 1 tablet by mouth daily. , Disp: , Rfl:    calcium carbonate (OSCAL) 1500 (600 Ca) MG TABS tablet, Take by mouth., Disp: , Rfl:    Calcium Carbonate-Vitamin D (CALCIUM-VITAMIN D) 500-200 MG-UNIT per tablet, Take 1 tablet by mouth daily., Disp: , Rfl:    cyclobenzaprine (FLEXERIL) 10 MG tablet, Take 10 mg by mouth 3 (three) times daily as needed for muscle spasms.  (Patient not taking: Reported on 12/05/2020), Disp: , Rfl:    ferrous sulfate 325 (65 FE) MG tablet, Take 325 mg by mouth daily with breakfast., Disp: , Rfl:     fluticasone furoate-vilanterol (BREO ELLIPTA) 200-25 MCG/INH AEPB, Inhale 1 puff into the lungs daily. (Patient not taking: Reported on 12/05/2020), Disp: 1 each, Rfl: 0   fluticasone furoate-vilanterol (BREO ELLIPTA) 200-25 MCG/INH AEPB, Inhale 1 puff into the lungs daily., Disp: 60 each, Rfl: 3   fluticasone furoate-vilanterol (BREO ELLIPTA) 200-25 MCG/INH AEPB, Inhale into the lungs. (Patient not taking: Reported on 12/05/2020), Disp: , Rfl:    furosemide (LASIX) 20 MG tablet, Take 2 tablets (40 mg total) by mouth 2 (two) times daily. (Patient not taking: Reported on 12/05/2020), Disp: , Rfl:    furosemide (LASIX) 80 MG tablet, Take by mouth., Disp: , Rfl:    gabapentin (NEURONTIN) 300 MG capsule, Take 300 mg by mouth at bedtime.  (Patient not taking: Reported on 12/05/2020), Disp: , Rfl:    ibuprofen (ADVIL) 800 MG tablet, Take by mouth. (Patient not taking: Reported on 12/05/2020), Disp: , Rfl:    ibuprofen (ADVIL,MOTRIN) 200 MG tablet, Take 800 mg by mouth every 6 (six) hours as needed for headache or mild pain. , Disp: , Rfl:    ipratropium-albuterol (DUONEB) 0.5-2.5 (3) MG/3ML SOLN, Take 3 mLs by nebulization every 4 (four) hours as needed., Disp: , Rfl:    ketoconazole (NIZORAL) 2 % cream, Apply once daily under the breast and left under arm., Disp: 60 g, Rfl: 1   levalbuterol (XOPENEX) 0.31 MG/3ML nebulizer solution, Inhale into the lungs., Disp: , Rfl:    levothyroxine (SYNTHROID) 112 MCG  tablet, Take 112 mcg by mouth daily before breakfast. Take 2 tabs po QD (Patient not taking: Reported on 12/05/2020), Disp: , Rfl:    levothyroxine (SYNTHROID) 112 MCG tablet, Take by mouth., Disp: , Rfl:    levothyroxine (SYNTHROID, LEVOTHROID) 200 MCG tablet, Take 200 mcg by mouth daily before breakfast., Disp: , Rfl:    loratadine-pseudoephedrine (CLARITIN-D 24-HOUR) 10-240 MG 24 hr tablet, Take 1 tablet by mouth daily as needed for allergies., Disp: , Rfl:    losartan (COZAAR) 25 MG tablet, Take 25 mg by  mouth daily., Disp: , Rfl:    losartan (COZAAR) 25 MG tablet, Take 1 tablet by mouth daily. (Patient not taking: Reported on 12/05/2020), Disp: , Rfl:    Multiple Vitamins-Minerals (MULTIVITAMIN WOMEN PO), Take 1 tablet by mouth daily., Disp: , Rfl:    Omega-3 Fatty Acids (OMEGA 3 PO), Take 1 capsule by mouth daily., Disp: , Rfl:    oxybutynin (DITROPAN) 5 MG tablet, Take 5 mg by mouth 3 (three) times daily. (Patient not taking: Reported on 12/05/2020), Disp: , Rfl:    oxybutynin (DITROPAN) 5 MG tablet, Take 1 tablet by mouth 3 (three) times daily., Disp: , Rfl:    potassium chloride SA (K-DUR,KLOR-CON) 20 MEQ tablet, Take 20 mEq by mouth 2 (two) times daily.  (Patient not taking: Reported on 12/05/2020), Disp: , Rfl:    potassium chloride SA (KLOR-CON) 20 MEQ tablet, Take 1 tablet by mouth 2 (two) times daily., Disp: , Rfl:    pramipexole (MIRAPEX) 1 MG tablet, Take 4 mg by mouth at bedtime. , Disp: , Rfl:    spironolactone (ALDACTONE) 25 MG tablet, Take 25 mg by mouth 2 (two) times daily. , Disp: , Rfl:    spironolactone (ALDACTONE) 25 MG tablet, Take 1 tablet by mouth 2 (two) times daily. (Patient not taking: Reported on 12/05/2020), Disp: , Rfl:    topiramate (TOPAMAX) 100 MG tablet, Take 100 mg by mouth 2 (two) times daily., Disp: , Rfl:    topiramate (TOPAMAX) 100 MG tablet, Take by mouth. (Patient not taking: Reported on 12/05/2020), Disp: , Rfl:    traMADol (ULTRAM) 50 MG tablet, Take 1 tablet (50 mg total) by mouth every 8 (eight) hours as needed (for mild pain)., Disp: 30 tablet, Rfl: 0   traMADol (ULTRAM) 50 MG tablet, Take 1 tablet by mouth every 8 (eight) hours as needed. (Patient not taking: Reported on 12/05/2020), Disp: , Rfl:    zolpidem (AMBIEN) 10 MG tablet, Take 5 mg by mouth at bedtime as needed for sleep., Disp: , Rfl:   Past Medical History: Past Medical History:  Diagnosis Date   Anxiety    Arthritis    CHF (congestive heart failure) (HCC)    COPD (chronic obstructive  pulmonary disease) ([place])    Dysplastic nevus 04/22/2019   R abdomen parallel to sup umbilicus - mod   Dysplastic nevus 04/22/2019   R low back above waistline - mild    Edema    feet/legs   Hypertension    dr Otho Najjar      Hypothyroidism    Leg weakness, bilateral    Osteoporosis    Oxygen deficit    2l with bipap at night   RLS (restless legs syndrome)    Shortness of breath    Sleep apnea    bipap   Wheezing     Tobacco Use: Social History   Tobacco Use  Smoking Status Former   Packs/day: 1.00   Years: 10.00  Pack years: 10.00   Types: Cigarettes   Quit date: 03/20/1975   Years since quitting: 45.9  Smokeless Tobacco Never    Labs: Recent Review Flowsheet Data     Labs for ITP Cardiac and Pulmonary Rehab Latest Ref Rng & Units 07/15/2016 07/15/2016 07/17/2016 08/01/2016 11/17/2020   Trlycerides <150 mg/dL - - 142 - -   Hemoglobin A1c 4.0 - 6.0 % - - - - -   PHART 7.350 - 7.450 7.44 7.52(H) - - 7.39   PCO2ART 32.0 - 48.0 mmHg 91(HH) 71(HH) - - 55(H)   HCO3 20.0 - 28.0 mmol/L 61.8(H) 58.0(H) - 54.6(H) 33.3(H)   ACIDBASEDEF 0.0 - 2.0 mmol/L - - - - -   O2SAT % 97.2 94.9 - 67.1 90.7        Pulmonary Assessment Scores:  Pulmonary Assessment Scores     Row Name 01/10/21 1126         ADL UCSD   ADL Phase Entry     SOB Score total 11     Rest 0     Walk 1     Stairs 0  didn't complete as she stated she does not do them     Bath 0     Dress 1     Shop 1       CAT Score   CAT Score 9       mMRC Score   mMRC Score 0              UCSD: Self-administered rating of dyspnea associated with activities of daily living (ADLs) 6-point scale (0 = "not at all" to 5 = "maximal or unable to do because of breathlessness")  Scoring Scores range from 0 to 120.  Minimally important difference is 5 units  CAT: CAT can identify the health impairment of COPD patients and is better correlated with disease progression.  CAT has a scoring range of zero to 40. The CAT  score is classified into four groups of low (less than 10), medium (10 - 20), high (21-30) and very high (31-40) based on the impact level of disease on health status. A CAT score over 10 suggests significant symptoms.  A worsening CAT score could be explained by an exacerbation, poor medication adherence, poor inhaler technique, or progression of COPD or comorbid conditions.  CAT MCID is 2 points  mMRC: mMRC (Modified Medical Research Council) Dyspnea Scale is used to assess the degree of baseline functional disability in patients of respiratory disease due to dyspnea. No minimal important difference is established. A decrease in score of 1 point or greater is considered a positive change.   Pulmonary Function Assessment:  Pulmonary Function Assessment - 12/05/20 1118       Breath   Shortness of Breath Yes;Limiting activity             Exercise Target Goals: Exercise Program Goal: Individual exercise prescription set using results from initial 6 min walk test and THRR while considering  patient's activity barriers and safety.   Exercise Prescription Goal: Initial exercise prescription builds to 30-45 minutes a day of aerobic activity, 2-3 days per week.  Home exercise guidelines will be given to patient during program as part of exercise prescription that the participant will acknowledge.  Education: Aerobic Exercise: - Group verbal and visual presentation on the components of exercise prescription. Introduces F.I.T.T principle from ACSM for exercise prescriptions.  Reviews F.I.T.T. principles of aerobic exercise including progression. Written material given at graduation. Flowsheet  Row Pulmonary Rehab from 02/01/2021 in [facility] Cardiac and Pulmonary Rehab  Education need identified 01/10/21       Education: Resistance Exercise: - Group verbal and visual presentation on the components of exercise prescription. Introduces F.I.T.T principle from ACSM for exercise prescriptions   Reviews F.I.T.T. principles of resistance exercise including progression. Written material given at graduation.    Education: Exercise & Equipment Safety: - Individual verbal instruction and demonstration of equipment use and safety with use of the equipment. Flowsheet Row Pulmonary Rehab from 12/05/2020 in [facility] Cardiac and Pulmonary Rehab  Date 12/05/20  Educator [facility]  Instruction Review Code 1- Verbalizes Understanding       Education: Exercise Physiology & General Exercise Guidelines: - Group verbal and written instruction with models to review the exercise physiology of the cardiovascular system and associated critical values. Provides general exercise guidelines with specific guidelines to those with heart or lung disease.    Education: Flexibility, Balance, Mind/Body Relaxation: - Group verbal and visual presentation with interactive activity on the components of exercise prescription. Introduces F.I.T.T principle from ACSM for exercise prescriptions. Reviews F.I.T.T. principles of flexibility and balance exercise training including progression. Also discusses the mind body connection.  Reviews various relaxation techniques to help reduce and manage stress (i.e. Deep breathing, progressive muscle relaxation, and visualization). Balance handout provided to take home. Written material given at graduation. Flowsheet Row Pulmonary Rehab from 02/01/2021 in [facility] Cardiac and Pulmonary Rehab  Date 01/25/21  Educator AS  Instruction Review Code 1- Verbalizes Understanding       Activity Barriers & Risk Stratification:   6 Minute Walk:  6 Minute Walk     Row Name 01/10/21 1142         6 Minute Walk   Phase Initial     Distance 165 feet     Walk Time 0 minutes     # of Rest Breaks 0     MPH 0.31     METS 0.17     RPE 13     Perceived Dyspnea  1     VO2 Peak 0.62     Symptoms Yes (comment)     Comments Legs fatigued     Resting HR 72 bpm     Resting BP 124/78     Resting  Oxygen Saturation  95 %     Exercise Oxygen Saturation  during 6 min walk 85 %     Max Ex. HR 106 bpm     Max Ex. BP 152/76     2 Minute Post BP 130/76       Interval HR   1 Minute HR 93     2 Minute HR 97     3 Minute HR 98     4 Minute HR 106     5 Minute HR 105     6 Minute HR 100     2 Minute Post HR 75     Interval Heart Rate? Yes       Interval Oxygen   Interval Oxygen? Yes     Baseline Oxygen Saturation % 95 %     1 Minute Oxygen Saturation % 90 %     1 Minute Liters of Oxygen 0 L  RA     2 Minute Oxygen Saturation % 87 %     2 Minute Liters of Oxygen 0 L     3 Minute Oxygen Saturation % 88 %     3 Minute Liters of Oxygen  0 L     4 Minute Oxygen Saturation % 89 %     4 Minute Liters of Oxygen 0 L     5 Minute Oxygen Saturation % 89 %     5 Minute Liters of Oxygen 0 L     6 Minute Oxygen Saturation % 85 %     6 Minute Liters of Oxygen 0 L     2 Minute Post Oxygen Saturation % 93 %     2 Minute Post Liters of Oxygen 0 L             Oxygen Initial Assessment:  Oxygen Initial Assessment - 02/01/21 1341       Home Oxygen   Home Oxygen Device Home Concentrator;Portable Concentrator    Sleep Oxygen Prescription BiPAP    Liters per minute 2    Home Exercise Oxygen Prescription None    Home Resting Oxygen Prescription None    Compliance with Home Oxygen Use Yes      Initial 6 min Walk   Oxygen Used None      Program Oxygen Prescription   Program Oxygen Prescription None      Intervention   Short Term Goals To learn and exhibit compliance with exercise, home and travel O2 prescription;To learn and understand importance of monitoring SPO2 with pulse oximeter and demonstrate accurate use of the pulse oximeter.;To learn and understand importance of maintaining oxygen saturations>88%;To learn and demonstrate proper pursed lip breathing techniques or other breathing techniques.     Long  Term Goals Exhibits compliance with exercise, home  and travel O2  prescription;Verbalizes importance of monitoring SPO2 with pulse oximeter and return demonstration;Maintenance of O2 saturations>88%;Exhibits proper breathing techniques, such as pursed lip breathing or other method taught during program session             Oxygen Re-Evaluation:  Oxygen Re-Evaluation     Row Name 01/23/21 1355 02/01/21 1341           Program Oxygen Prescription   Program Oxygen Prescription None --        Home Oxygen   Home Oxygen Device Home Concentrator;Portable Concentrator --      Sleep Oxygen Prescription BiPAP --      Liters per minute 2 --      Home Exercise Oxygen Prescription None --      Home Resting Oxygen Prescription None --      Compliance with Home Oxygen Use Yes --        Goals/Expected Outcomes   Short Term Goals To learn and exhibit compliance with exercise, home and travel O2 prescription;To learn and understand importance of monitoring SPO2 with pulse oximeter and demonstrate accurate use of the pulse oximeter.;To learn and understand importance of maintaining oxygen saturations>88%;To learn and demonstrate proper pursed lip breathing techniques or other breathing techniques.  --      Long  Term Goals Exhibits compliance with exercise, home  and travel O2 prescription;Verbalizes importance of monitoring SPO2 with pulse oximeter and return demonstration;Maintenance of O2 saturations>88%;Exhibits proper breathing techniques, such as pursed lip breathing or other method taught during program session --      Comments Reviewed PLB technique with pt.  Talked about how it works and it's importance in maintaining their exercise saturations. Hannah Powell uses her pulse ox at home and is aware to have her oxygen stay above 88%. She knows how to practice PLB when needed and when she feels SOB.      Goals/Expected Outcomes  Short: Become more profiecient at using PLB.   Long: Become independent at using PLB. Short: Continue monitoring O2 at home Long: Become independent  using PLB               Oxygen Discharge (Final Oxygen Re-Evaluation):  Oxygen Re-Evaluation - 02/01/21 1341       Goals/Expected Outcomes   Comments Hannah Powell uses her pulse ox at home and is aware to have her oxygen stay above 88%. She knows how to practice PLB when needed and when she feels SOB.    Goals/Expected Outcomes Short: Continue monitoring O2 at home Long: Become independent using PLB             Initial Exercise Prescription:  Initial Exercise Prescription - 01/10/21 1500       Date of Initial Exercise RX and Referring Provider   Date 01/10/21    Referring Provider Ottie Glazier MD      Oxygen   Maintain Oxygen Saturation 88% or higher      Recumbant Bike   Level 1    RPM 60    Minutes 15    METs 1      NuStep   Level 1    SPM 80    Minutes 15    METs 1      REL-XR   Level 1    Speed 50    Minutes 15    METs 1      Track   Laps 2   as tolerated   Minutes 15    METs 1      Prescription Details   Frequency (times per week) 2    Duration Progress to 30 minutes of continuous aerobic without signs/symptoms of physical distress      Intensity   THRR 40-80% of Max Heartrate 101-131    Ratings of Perceived Exertion 11-13    Perceived Dyspnea 0-4      Progression   Progression Continue to progress workloads to maintain intensity without signs/symptoms of physical distress.      Resistance Training   Training Prescription Yes    Weight 3 lb    Reps 10-15             Perform Capillary Blood Glucose checks as needed.  Exercise Prescription Changes:   Exercise Prescription Changes     Row Name 01/10/21 1500 01/31/21 1500 02/14/21 1400         Response to Exercise   Blood Pressure (Admit) 124/78 130/72 138/76     Blood Pressure (Exercise) 152/76 -- 144/76     Blood Pressure (Exit) 132/78 134/74 126/72     Heart Rate (Admit) 72 bpm 95 bpm 103 bpm     Heart Rate (Exercise) 106 bpm 99 bpm 106 bpm     Heart Rate (Exit) 75 bpm 92  bpm 93 bpm     Oxygen Saturation (Admit) 95 % 90 % 91 %     Oxygen Saturation (Exercise) 85 % 92 % 85 %     Oxygen Saturation (Exit) 94 % 93 % 94 %     Rating of Perceived Exertion (Exercise) _0 Perceived Dyspnea (Exercise) 1 1 0     Symptoms Legs fatigued none --     Comments walk test results -- --     Duration -- Progress to 30 minutes of  aerobic without signs/symptoms of physical distress Continue with 30 min of aerobic exercise without signs/symptoms of physical  distress.     Intensity -- THRR unchanged THRR unchanged       Progression   Progression -- Continue to progress workloads to maintain intensity without signs/symptoms of physical distress. Continue to progress workloads to maintain intensity without signs/symptoms of physical distress.     Average METs -- 1.25 1.35       Resistance Training   Training Prescription -- Yes Yes     Weight -- 3 lb 3 lb     Reps -- 10-15 10-15       Interval Training   Interval Training -- No No       NuStep   Level -- 1 2     Minutes -- 15 30     METs -- 1.5 1.4       Biostep-RELP   Level -- 1 --     Minutes -- 15 --     METs -- 1 --       Oxygen   Maintain Oxygen Saturation -- -- 88% or higher              Exercise Comments:   Exercise Comments     Row Name 01/23/21 1354           Exercise Comments First full day of exercise!  Patient was oriented to gym and equipment including functions, settings, policies, and procedures.  Patient's individual exercise prescription and treatment plan were reviewed.  All starting workloads were established based on the results of the 6 minute walk test done at initial orientation visit.  The plan for exercise progression was also introduced and progression will be customized based on patient's performance and goals.                Exercise Goals and Review:   Exercise Goals     Row Name 01/10/21 1515             Exercise Goals   Increase Physical Activity  Yes       Intervention Provide advice, education, support and counseling about physical activity/exercise needs.;Develop an individualized exercise prescription for aerobic and resistive training based on initial evaluation findings, risk stratification, comorbidities and participant's personal goals.       Expected Outcomes Short Term: Attend rehab on a regular basis to increase amount of physical activity.;Long Term: Add in home exercise to make exercise part of routine and to increase amount of physical activity.;Long Term: Exercising regularly at least 3-5 days a week.       Increase Strength and Stamina Yes       Intervention Provide advice, education, support and counseling about physical activity/exercise needs.;Develop an individualized exercise prescription for aerobic and resistive training based on initial evaluation findings, risk stratification, comorbidities and participant's personal goals.       Expected Outcomes Short Term: Increase workloads from initial exercise prescription for resistance, speed, and METs.;Short Term: Perform resistance training exercises routinely during rehab and add in resistance training at home;Long Term: Improve cardiorespiratory fitness, muscular endurance and strength as measured by increased METs and functional capacity (6MWT)       Able to understand and use rate of perceived exertion (RPE) scale Yes       Intervention Provide education and explanation on how to use RPE scale       Expected Outcomes Short Term: Able to use RPE daily in rehab to express subjective intensity level;Long Term:  Able to use RPE to guide intensity level when exercising independently  Able to understand and use Dyspnea scale Yes       Intervention Provide education and explanation on how to use Dyspnea scale       Expected Outcomes Short Term: Able to use Dyspnea scale daily in rehab to express subjective sense of shortness of breath during exertion;Long Term: Able to use  Dyspnea scale to guide intensity level when exercising independently       Knowledge and understanding of Target Heart Rate Range (THRR) Yes       Intervention Provide education and explanation of THRR including how the numbers were predicted and where they are located for reference       Expected Outcomes Short Term: Able to state/look up THRR;Long Term: Able to use THRR to govern intensity when exercising independently;Short Term: Able to use daily as guideline for intensity in rehab       Able to check pulse independently Yes       Intervention Provide education and demonstration on how to check pulse in carotid and radial arteries.;Review the importance of being able to check your own pulse for safety during independent exercise       Expected Outcomes Short Term: Able to explain why pulse checking is important during independent exercise;Long Term: Able to check pulse independently and accurately       Understanding of Exercise Prescription Yes       Intervention Provide education, explanation, and written materials on patient's individual exercise prescription       Expected Outcomes Short Term: Able to explain program exercise prescription;Long Term: Able to explain home exercise prescription to exercise independently                Exercise Goals Re-Evaluation :  Exercise Goals Re-Evaluation     Row Name 01/23/21 1354 01/31/21 1526 02/01/21 1339 02/14/21 1406       Exercise Goal Re-Evaluation   Exercise Goals Review Able to understand and use rate of perceived exertion (RPE) scale;Increase Physical Activity;Knowledge and understanding of Target Heart Rate Range (THRR);Understanding of Exercise Prescription;Increase Strength and Stamina;Able to understand and use Dyspnea scale;Able to check pulse independently Increase Physical Activity;Increase Strength and Stamina Increase Physical Activity;Increase Strength and Stamina Increase Physical Activity;Increase Strength and  Stamina;Understanding of Exercise Prescription    Comments Reviewed RPE and dyspnea scales, THR and program prescription with pt today.  Pt voiced understanding and was given a copy of goals to take home. Hannah Powell is just getting started with her exercise program.  She almost reaches her THR range.  Staff will monitor progress. EP will hold off on doing home exercise with patient until patient is further into the program. She is a member at [employer] and she plans to go there once she is more in habit of rehab with exercise. I encouraged her to keep moving and start home exercise as soon as she feels ready. We talked about starting with 1 day at home. Hannah Powell is doing well in rehab.  She is getting in her full 30 min on the NuStep at level 2!  We will conitnue to montior her progress.    Expected Outcomes Short: Use RPE daily to regulate intensity. Long: Follow program prescription in [facility]. Short: attend consistently Long:  improve overall stamina Short: Go over home exercise Long: Exercise independently at home at appropriate prescription short: Improve walking more Long; Conitnue to improve stamina             Discharge Exercise Prescription (Final Exercise Prescription  Changes):  Exercise Prescription Changes - 02/14/21 1400       Response to Exercise   Blood Pressure (Admit) 138/76    Blood Pressure (Exercise) 144/76    Blood Pressure (Exit) 126/72    Heart Rate (Admit) 103 bpm    Heart Rate (Exercise) 106 bpm    Heart Rate (Exit) 93 bpm    Oxygen Saturation (Admit) 91 %    Oxygen Saturation (Exercise) 85 %    Oxygen Saturation (Exit) 94 %    Rating of Perceived Exertion (Exercise) 11    Perceived Dyspnea (Exercise) 0    Duration Continue with 30 min of aerobic exercise without signs/symptoms of physical distress.    Intensity THRR unchanged      Progression   Progression Continue to progress workloads to maintain intensity without signs/symptoms of physical distress.    Average  METs 1.35      Resistance Training   Training Prescription Yes    Weight 3 lb    Reps 10-15      Interval Training   Interval Training No      NuStep   Level 2    Minutes 30    METs 1.4      Oxygen   Maintain Oxygen Saturation 88% or higher             Nutrition:  Target Goals: Understanding of nutrition guidelines, daily intake of sodium <1571m, cholesterol <2083m calories 30% from fat and 7% or less from saturated fats, daily to have 5 or more servings of fruits and vegetables.  Education: All About Nutrition: -Group instruction provided by verbal, written material, interactive activities, discussions, models, and posters to present general guidelines for heart healthy nutrition including fat, fiber, MyPlate, the role of sodium in heart healthy nutrition, utilization of the nutrition label, and utilization of this knowledge for meal planning. Follow up email sent as well. Written material given at graduation. [facility]  Date 02/01/21  [facility] Review Code 1- Verbalizes Understanding       Biometrics:    Nutrition Therapy Plan and Nutrition Goals:  Nutrition Therapy & Goals - 01/10/21 1516       Intervention Plan   Intervention Prescribe, educate and counsel regarding individualized specific dietary modifications aiming towards targeted core components such as weight, hypertension, lipid management, diabetes, heart failure and other comorbidities.    Expected Outcomes Short Term Goal: Understand basic principles of dietary content, such as calories, fat, sodium, cholesterol and nutrients.;Short Term Goal: A plan has been developed with personal nutrition goals set during dietitian appointment.;Long Term Goal: Adherence to prescribed nutrition plan.             Nutrition Assessments:  MEDIFICTS Score Key: ?70 Need to make dietary changes  40-70 Heart Healthy Diet ? 40  Therapeutic Level Cholesterol Diet  [facility]  Picture Your Plate Total Score on Admission 76      Picture Your Plate Scores: <4<16nhealthy dietary pattern with much room for improvement. 41-50 Dietary pattern unlikely to meet recommendations for good health and room for improvement. 51-60 More healthful dietary pattern, with some room for improvement.  >60 Healthy dietary pattern, although there may be some specific behaviors that could be improved.   Nutrition Goals Re-Evaluation:  Nutrition Goals Re-Evaluation     [place] 02/01/21 [phone]  Goals   Comment Hannah Powell has not met with the RD yet. She has an appointment coming up.       Expected Outcome Short: Meet with RD Long: Follow goals established by RD                Nutrition Goals Discharge (Final Nutrition Goals Re-Evaluation):  Nutrition Goals Re-Evaluation - 02/01/21 1519       Goals   Comment Hannah Powell has not met with the RD yet. She has an appointment coming up.    Expected Outcome Short: Meet with RD Long: Follow goals established by RD             Psychosocial: Target Goals: Acknowledge presence or absence of significant depression and/or stress, maximize coping skills, provide positive support system. Participant is able to verbalize types and ability to use techniques and skills needed for reducing stress and depression.   Education: Stress, Anxiety, and Depression - Group verbal and visual presentation to define topics covered.  Reviews how body is impacted by stress, anxiety, and depression.  Also discusses healthy ways to reduce stress and to treat/manage anxiety and depression.  Written material given at graduation.   Education: Sleep Hygiene -Provides group verbal and written instruction about how sleep can affect your health.  Define sleep hygiene, discuss sleep cycles and impact of sleep habits. Review good sleep  hygiene tips.    Initial Review & Psychosocial Screening:  Initial Psych Review & Screening - 12/05/20 1119       Initial Review   Current issues with Current Sleep Concerns;Current Stress Concerns    Source of Stress Concerns Chronic Illness;Unable to participate in former interests or hobbies    Comments Hannah Powell states that she is not depressed but is having a hard time coping with her legs being weak. She can look to her husband, two children and grand children that she can depend on.      Family Dynamics   Good Support System? Yes      Barriers   Psychosocial barriers to participate in program The patient should benefit from training in stress management and relaxation.      Screening Interventions   Interventions To provide support and resources with identified psychosocial needs;Provide feedback about the scores to participant;Encouraged to exercise    Expected Outcomes Short Term goal: Utilizing psychosocial counselor, staff and physician to assist with identification of specific Stressors or current issues interfering with healing process. Setting desired goal for each stressor or current issue identified.;Long Term Goal: Stressors or current issues are controlled or eliminated.;Short Term goal: Identification and review with participant of any Quality of Life or Depression concerns found by scoring the questionnaire.;Long Term goal: The participant improves quality of Life and PHQ9 Scores as seen by post scores and/or verbalization of changes             Quality of Life Scores:  Scores of 19 and below usually indicate a poorer quality of life in these areas.  A difference of  2-3 points is a clinically meaningful difference.  A difference of 2-3 points in the total score of the Quality of Life Index has been associated with significant improvement in overall quality of life, self-image, physical symptoms, and general health in studies assessing change in quality of  life.  PHQ-9: Recent Review Flowsheet Data     Depression screen [facility] 2/9 01/10/2021 10/01/2016 08/09/2016   Decreased Interest 0 0 0   Down, Depressed, Hopeless 0 0 0  PHQ - 2 Score 0 0 0   Altered sleeping 1 - -   Tired, decreased energy 1 - -   Change in appetite 0 - -   Feeling bad or failure about yourself  1 - -   Trouble concentrating 0 - -   Moving slowly or fidgety/restless 0 - -   Suicidal thoughts 0 - -   PHQ-9 Score 3 - -      Interpretation of Total Score  Total Score Depression Severity:  1-4 = Minimal depression, 5-9 = Mild depression, 10-14 = Moderate depression, 15-19 = Moderately severe depression, 20-27 = Severe depression   Psychosocial Evaluation and Intervention:  Psychosocial Evaluation - 12/05/20 1122       Psychosocial Evaluation & Interventions   Interventions Encouraged to exercise with the program and follow exercise prescription;Relaxation education;Stress management education    Comments Hannah Powell states that she is not depressed but is having a hard time coping with her legs being weak. She can look to her husband, two children and grand children that she can depend on.    Expected Outcomes Short: Start [facility] to help with mood. Long: Maintain a healthy mental state.    Continue Psychosocial Services  Follow up required by staff             Psychosocial Re-Evaluation:  Psychosocial Re-Evaluation     [place] Name 02/01/21 1347             Psychosocial Re-Evaluation   Current issues with Current Stress Concerns       Comments Hannah Powell is holding up well mentally. Her leg weakness and arthritis are her biggest problems that limits her from moving and is frustrating for her. She is not traveling to her family in [place] due to her medical problems and is a little upset by it.  She is enjoying [facility] thus far and is thinking it'll help her tremendously. She takes Alprazolam is as needed and has not needed to take it in while.       Expected  Outcomes Short: Continue attendance with rehab Long: Continue to maintain posititive attitude and utilize exercise for stress management       Interventions Encouraged to attend Pulmonary Rehabilitation for the exercise       Continue Psychosocial Services  Follow up required by staff                Psychosocial Discharge (Final Psychosocial Re-Evaluation):  Psychosocial Re-Evaluation - 02/01/21 1347       Psychosocial Re-Evaluation   Current issues with Current Stress Concerns    Comments Hannah Powell is holding up well mentally. Her leg weakness and arthritis are her biggest problems that limits her from moving and is frustrating for her. She is not traveling to her family in [place] due to her medical problems and is a little upset by it.  She is enjoying [facility] thus far and is thinking it'll help her tremendously. She takes Alprazolam is as needed and has not needed to take it in while.    Expected Outcomes Short: Continue attendance with rehab Long: Continue to maintain posititive attitude and utilize exercise for stress management    Interventions Encouraged to attend Pulmonary Rehabilitation for the exercise    Continue Psychosocial Services  Follow up required by staff             Education: Education Goals: Education classes will be provided on a weekly basis, covering required topics. Participant will state understanding/return demonstration of  topics presented.  Learning Barriers/Preferences:  Learning Barriers/Preferences - 12/05/20 1118       Learning Barriers/Preferences   Learning Barriers None    Learning Preferences None             General Pulmonary Education Topics:  Infection Prevention: - Provides verbal and written material to individual with discussion of infection control including proper hand washing and proper equipment cleaning during exercise session. Flowsheet Row Pulmonary Rehab from 12/05/2020 in [facility] Cardiac and Pulmonary Rehab  Date 12/05/20   Educator [facility]  Instruction Review Code 1- Verbalizes Understanding       Falls Prevention: - Provides verbal and written material to individual with discussion of falls prevention and safety. Flowsheet Row Pulmonary Rehab from 12/05/2020 in [facility] Cardiac and Pulmonary Rehab  Date 12/05/20  Educator [facility]  Instruction Review Code 1- Verbalizes Understanding       Chronic Lung Disease Review: - Group verbal instruction with posters, models, PowerPoint presentations and videos,  to review new updates, new respiratory medications, new advancements in procedures and treatments. Providing information on websites and "800" numbers for continued self-education. Includes information about supplement oxygen, available portable oxygen systems, continuous and intermittent flow rates, oxygen safety, concentrators, and Medicare reimbursement for oxygen. Explanation of Pulmonary Drugs, including class, frequency, complications, importance of spacers, rinsing mouth after steroid MDI's, and proper cleaning methods for nebulizers. Review of basic lung anatomy and physiology related to function, structure, and complications of lung disease. Review of risk factors. Discussion about methods for diagnosing sleep apnea and types of masks and machines for OSA. Includes a review of the use of types of environmental controls: home humidity, furnaces, filters, dust mite/pet prevention, HEPA vacuums. Discussion about weather changes, air quality and the benefits of nasal washing. Instruction on Warning signs, infection symptoms, calling MD promptly, preventive modes, and value of vaccinations. Review of effective airway clearance, coughing and/or vibration techniques. Emphasizing that all should Create an Action Plan. Written material given at graduation. Flowsheet Row Pulmonary Rehab from 02/01/2021 in [facility] Cardiac and Pulmonary Rehab  Education need identified 01/10/21       AED/CPR: - Group verbal and written instruction  with the use of models to demonstrate the basic use of the AED with the basic ABC's of resuscitation.    Anatomy and Cardiac Procedures: - Group verbal and visual presentation and models provide information about basic cardiac anatomy and function. Reviews the testing methods done to diagnose heart disease and the outcomes of the test results. Describes the treatment choices: Medical Management, Angioplasty, or Coronary Bypass Surgery for treating various heart conditions including Myocardial Infarction, Angina, Valve Disease, and Cardiac Arrhythmias.  Written material given at graduation.   Medication Safety: - Group verbal and visual instruction to review commonly prescribed medications for heart and lung disease. Reviews the medication, class of the drug, and side effects. Includes the steps to properly store meds and maintain the prescription regimen.  Written material given at graduation.   Other: -Provides group and verbal instruction on various topics (see comments)   Knowledge Questionnaire Score:  Knowledge Questionnaire Score - 01/10/21 1122       Knowledge Questionnaire Score   Pre Score 16/18: Exercise, O2              Core Components/Risk Factors/Patient Goals at Admission:  Personal Goals and Risk Factors at Admission - 01/10/21 1516       Core Components/Risk Factors/Patient Goals on Admission    Weight Management Yes;Weight Loss  Intervention Weight Management: Develop a combined nutrition and exercise program designed to reach desired caloric intake, while maintaining appropriate intake of nutrient and fiber, sodium and fats, and appropriate energy expenditure required for the weight goal.;Weight Management: Provide education and appropriate resources to help participant work on and attain dietary goals.;Weight Management/Obesity: Establish reasonable short term and long term weight goals.    Admit Weight 232 lb (105.2 kg)    Goal Weight: Short Term 227 lb (103  kg)    Goal Weight: Long Term 200 lb (90.7 kg)    Expected Outcomes Short Term: Continue to assess and modify interventions until short term weight is achieved;Long Term: Adherence to nutrition and physical activity/exercise program aimed toward attainment of established weight goal;Weight Loss: Understanding of general recommendations for a balanced deficit meal plan, which promotes 1-2 lb weight loss per week and includes a negative energy balance of [phone] kcal/d;Understanding recommendations for meals to include 15-35% energy as protein, 25-35% energy from fat, 35-60% energy from carbohydrates, less than 238m of dietary cholesterol, 20-35 gm of total fiber daily;Understanding of distribution of calorie intake throughout the day with the consumption of 4-5 meals/snacks    Improve shortness of breath with ADL's Yes    Intervention Provide education, individualized exercise plan and daily activity instruction to help decrease symptoms of SOB with activities of daily living.    Expected Outcomes Short Term: Improve cardiorespiratory fitness to achieve a reduction of symptoms when performing ADLs;Long Term: Be able to perform more ADLs without symptoms or delay the onset of symptoms    Heart Failure Yes    Intervention Provide a combined exercise and nutrition program that is supplemented with education, support and counseling about heart failure. Directed toward relieving symptoms such as shortness of breath, decreased exercise tolerance, and extremity edema.    Expected Outcomes Improve functional capacity of life;Short term: Attendance in program 2-3 days a week with increased exercise capacity. Reported lower sodium intake. Reported increased fruit and vegetable intake. Reports medication compliance.;Short term: Daily weights obtained and reported for increase. Utilizing diuretic protocols set by physician.;Long term: Adoption of self-care skills and reduction of barriers for early signs and symptoms  recognition and intervention leading to self-care maintenance.    Hypertension Yes    Intervention Provide education on lifestyle modifcations including regular physical activity/exercise, weight management, moderate sodium restriction and increased consumption of fresh fruit, vegetables, and low fat dairy, alcohol moderation, and smoking cessation.;Monitor prescription use compliance.    Expected Outcomes Short Term: Continued assessment and intervention until BP is < 140/958mHG in hypertensive participants. < 130/8052mG in hypertensive participants with diabetes, heart failure or chronic kidney disease.;Long Term: Maintenance of blood pressure at goal levels.             Education:Diabetes - Individual verbal and written instruction to review signs/symptoms of diabetes, desired ranges of glucose level fasting, after meals and with exercise. Acknowledge that pre and post exercise glucose checks will be done for 3 sessions at entry of program.   Know Your Numbers and Heart Failure: - Group verbal and visual instruction to discuss disease risk factors for cardiac and pulmonary disease and treatment options.  Reviews associated critical values for Overweight/Obesity, Hypertension, Cholesterol, and Diabetes.  Discusses basics of heart failure: signs/symptoms and treatments.  Introduces Heart Failure Zone chart for action plan for heart failure.  Written material given at graduation.   Core Components/Risk Factors/Patient Goals Review:   Goals and Risk Factor Review  [place] Name 02/01/21 1345             Core Components/Risk Factors/Patient Goals Review   Personal Goals Review Hypertension;Weight Management/Obesity;Heart Failure       Review Hannah Powell is focusing more on portion control, drinking more water, and reducing her snacks to help her lose weight which is still a goal of hers. Hannah Powell is weighing herself at home daily and is aware to note if she gains a lot of weight in a short period of  time. Denies any heart failure symptoms at this time. She checks her BP at home and is running 370-488Q systolic and 91-69I diastolic. Her BPs at rehab are good as well.       Expected Outcomes Short: Continue working towards weight loss Long: Manage life style risk factors                Core Components/Risk Factors/Patient Goals at Discharge (Final Review):   Goals and Risk Factor Review - 02/01/21 1345       Core Components/Risk Factors/Patient Goals Review   Personal Goals Review Hypertension;Weight Management/Obesity;Heart Failure    Review Hannah Powell is focusing more on portion control, drinking more water, and reducing her snacks to help her lose weight which is still a goal of hers. Hannah Powell is weighing herself at home daily and is aware to note if she gains a lot of weight in a short period of time. Denies any heart failure symptoms at this time. She checks her BP at home and is running 503-888K systolic and 80-03K diastolic. Her BPs at rehab are good as well.    Expected Outcomes Short: Continue working towards weight loss Long: Manage life style risk factors             ITP Comments:  ITP Comments     Row Name 12/05/20 1115 12/05/20 1125 01/10/21 1114 01/18/21 0648 01/23/21 1354   ITP Comments Virtual Visit completed. Patient informed on EP and RD appointment and 6 Minute walk test. Patient also informed of patient health questionnaires on My Chart. Patient Verbalizes understanding. Visit diagnosis can be found in [facility] 11/18/2020. Patient is to call back when she gets out of her boot. She had broke her ankle from a fall and will call back in two weeks for clearance to exercise. Completed 6MWT and gym orientation. Initial ITP created and sent for review to Dr. Ottie Glazier, Medical Director. 30 Day review completed. Medical Director ITP review done, changes made as directed, and signed approval by Medical Director.   New to program First full day of exercise!  Patient was oriented to gym  and equipment including functions, settings, policies, and procedures.  Patient's individual exercise prescription and treatment plan were reviewed.  All starting workloads were established based on the results of the 6 minute walk test done at initial orientation visit.  The plan for exercise progression was also introduced and progression will be customized based on patient's performance and goals.    [place] Name 02/15/21 0641           ITP Comments 30 Day review completed. Medical Director ITP review done, changes made as directed, and signed approval by Medical Director.    New to program                Comments:

## 2021-02-15 NOTE — Progress Notes (Signed)
Daily Session Note  Patient Details  Name: Hannah Powell MRN: [id number] Date of Birth: Sep 20, 1945 Referring Provider:   Flowsheet Row Pulmonary Rehab from 01/10/2021 in [facility] Cardiac and Pulmonary Rehab  Referring Provider Ottie Glazier MD       Encounter Date: 02/15/2021  Check In:  Session Check In - 02/15/21 1333       Check-In   Supervising physician immediately available to respond to emergencies See telemetry face sheet for immediately available ER MD    Location [facility]-Cardiac & Pulmonary Rehab    Staff Present Birdie Sons, MPA, RN;Joseph Lou Miner, MS, ASCM CEP, Exercise Physiologist    Virtual Visit No    Medication changes reported     No    Fall or balance concerns reported    No    Warm-up and Cool-down Performed on first and last piece of equipment    Resistance Training Performed Yes    VAD Patient? No    PAD/SET Patient? No      Pain Assessment   Currently in Pain? No/denies                Social History   Tobacco Use  Smoking Status Former   Packs/day: 1.00   Years: 10.00   Pack years: 10.00   Types: Cigarettes   Quit date: 03/20/1975   Years since quitting: 45.9  Smokeless Tobacco Never    Goals Met:  Independence with exercise equipment Exercise tolerated well No report of concerns or symptoms today Strength training completed today  Goals Unmet:  Not Applicable  Comments: Pt able to follow exercise prescription today without complaint.  Will continue to monitor for progression.    Dr. Emily Filbert is Medical Director for [facility].  Dr. Ottie Glazier is Medical Director for [facility] Pulmonary Rehabilitation.

## 2021-02-16 ENCOUNTER — Encounter: Payer: Medicare HMO | Attending: Pulmonary Disease | Admitting: *Deleted

## 2021-02-16 ENCOUNTER — Other Ambulatory Visit: Payer: Self-pay

## 2021-02-16 DIAGNOSIS — J449 Chronic obstructive pulmonary disease, unspecified: Secondary | ICD-10-CM | POA: Insufficient documentation

## 2021-02-16 NOTE — Progress Notes (Signed)
Daily Session Note  Patient Details  Name: Hannah Powell MRN: [id number] Date of Birth: Jun 03, 1945 Referring Provider:   Flowsheet Row Pulmonary Rehab from 01/10/2021 in [facility] Cardiac and Pulmonary Rehab  Referring Provider Ottie Glazier MD       Encounter Date: 02/16/2021  Check In:  Session Check In - 02/16/21 1345       Check-In   Supervising physician immediately available to respond to emergencies See telemetry face sheet for immediately available ER MD    Location [facility]-Cardiac & Pulmonary Rehab    Staff Present Renita Papa, RN BSN;Joseph Mayhill, RCP,RRT,BSRT;Jessica Luyando, [place], RCEP, CCRP, CCET    Virtual Visit No    Medication changes reported     No    Fall or balance concerns reported    No    Warm-up and Cool-down Performed on first and last piece of equipment    Resistance Training Performed Yes    VAD Patient? No    PAD/SET Patient? No      Pain Assessment   Currently in Pain? No/denies                Social History   Tobacco Use  Smoking Status Former   Packs/day: 1.00   Years: 10.00   Pack years: 10.00   Types: Cigarettes   Quit date: 03/20/1975   Years since quitting: 45.9  Smokeless Tobacco Never    Goals Met:  Independence with exercise equipment Exercise tolerated well No report of concerns or symptoms today Strength training completed today  Goals Unmet:  Not Applicable  Comments: Pt able to follow exercise prescription today without complaint.  Will continue to monitor for progression.    Dr. Emily Filbert is Medical Director for [place].  Dr. Ottie Glazier is Medical Director for [facility] Pulmonary Rehabilitation.

## 2021-02-20 ENCOUNTER — Other Ambulatory Visit: Payer: Self-pay

## 2021-02-20 DIAGNOSIS — J449 Chronic obstructive pulmonary disease, unspecified: Secondary | ICD-10-CM

## 2021-02-20 NOTE — Progress Notes (Signed)
Daily Session Note  Patient Details  Name: Hannah Powell MRN: [id number] Date of Birth: March 28, 1945 Referring Provider:   Flowsheet Row Pulmonary Rehab from 01/10/2021 in [facility] Cardiac and Pulmonary Rehab  Referring Provider Ottie Glazier MD       Encounter Date: 02/20/2021  Check In:  Session Check In - 02/20/21 1328       Check-In   Supervising physician immediately available to respond to emergencies See telemetry face sheet for immediately available ER MD    Location [facility]-Cardiac & Pulmonary Rehab    Staff Present Birdie Sons, MPA, Nino Glow, MS, ASCM CEP, Exercise Physiologist;Joseph Tessie Fass, [place]    Virtual Visit No    Medication changes reported     No    Fall or balance concerns reported    No    Warm-up and Cool-down Performed on first and last piece of equipment    Resistance Training Performed Yes    VAD Patient? No    PAD/SET Patient? No      Pain Assessment   Currently in Pain? No/denies                Social History   Tobacco Use  Smoking Status Former   Packs/day: 1.00   Years: 10.00   Pack years: 10.00   Types: Cigarettes   Quit date: 03/20/1975   Years since quitting: 45.9  Smokeless Tobacco Never    Goals Met:  Independence with exercise equipment Exercise tolerated well No report of concerns or symptoms today Strength training completed today  Goals Unmet:  Not Applicable  Comments: Pt able to follow exercise prescription today without complaint.  Will continue to monitor for progression.    Dr. Emily Filbert is Medical Director for [place].  Dr. Ottie Glazier is Medical Director for [facility] Pulmonary Rehabilitation.

## 2021-02-22 ENCOUNTER — Other Ambulatory Visit: Payer: Self-pay

## 2021-02-22 DIAGNOSIS — J449 Chronic obstructive pulmonary disease, unspecified: Secondary | ICD-10-CM

## 2021-02-22 NOTE — Progress Notes (Signed)
Daily Session Note  Patient Details  Name: Hannah Powell MRN: [id number] Date of Birth: Apr 20, 1945 Referring Provider:   Flowsheet Row Pulmonary Rehab from 01/10/2021 in [facility] Cardiac and Pulmonary Rehab  Referring Provider Ottie Glazier MD       Encounter Date: 02/22/2021  Check In:  Session Check In - 02/22/21 1329       Check-In   Supervising physician immediately available to respond to emergencies See telemetry face sheet for immediately available ER MD    Location [facility]-Cardiac & Pulmonary Rehab    Staff Present Birdie Sons, MPA, RN;Melissa [place], RDN, Tawanna Solo, MS, ASCM CEP, Exercise Physiologist    Virtual Visit No    Medication changes reported     No    Fall or balance concerns reported    No    Warm-up and Cool-down Performed on first and last piece of equipment    Resistance Training Performed Yes    VAD Patient? No    PAD/SET Patient? No      Pain Assessment   Currently in Pain? No/denies                Social History   Tobacco Use  Smoking Status Former   Packs/day: 1.00   Years: 10.00   Pack years: 10.00   Types: Cigarettes   Quit date: 03/20/1975   Years since quitting: 45.9  Smokeless Tobacco Never    Goals Met:  Independence with exercise equipment Exercise tolerated well No report of concerns or symptoms today Strength training completed today  Goals Unmet:  Not Applicable  Comments: Pt able to follow exercise prescription today without complaint.  Will continue to monitor for progression.    Dr. Emily Filbert is Medical Director for [place].  Dr. Ottie Glazier is Medical Director for [facility] Pulmonary Rehabilitation.

## 2021-02-27 ENCOUNTER — Other Ambulatory Visit: Payer: Self-pay

## 2021-02-27 ENCOUNTER — Telehealth: Payer: Self-pay

## 2021-02-27 DIAGNOSIS — J449 Chronic obstructive pulmonary disease, unspecified: Secondary | ICD-10-CM

## 2021-02-27 NOTE — Telephone Encounter (Signed)
Spoke with Vivien Rota to let her know Dr Lanney Gins will send referral

## 2021-02-27 NOTE — Progress Notes (Signed)
Daily Session Note  Patient Details  Name: Hannah Powell MRN: [id number] Date of Birth: 1945-04-25 Referring Provider:   Flowsheet Row Pulmonary Rehab from 01/10/2021 in [facility] Cardiac and Pulmonary Rehab  Referring Provider Ottie Glazier MD       Encounter Date: 02/27/2021  Check In:  Session Check In - 02/27/21 1325       Check-In   Supervising physician immediately available to respond to emergencies See telemetry face sheet for immediately available ER MD    Location [facility]-Cardiac & Pulmonary Rehab    Staff Present Birdie Sons, MPA, RN;Joseph Lou Miner, MS, ASCM CEP, Exercise Physiologist    Virtual Visit No    Medication changes reported     No    Fall or balance concerns reported    No    Warm-up and Cool-down Performed on first and last piece of equipment    Resistance Training Performed Yes    VAD Patient? No    PAD/SET Patient? No      Pain Assessment   Currently in Pain? No/denies                Social History   Tobacco Use  Smoking Status Former   Packs/day: 1.00   Years: 10.00   Pack years: 10.00   Types: Cigarettes   Quit date: 03/20/1975   Years since quitting: 45.9  Smokeless Tobacco Never    Goals Met:  Independence with exercise equipment Exercise tolerated well No report of concerns or symptoms today Strength training completed today  Goals Unmet:  Not Applicable  Comments: Pt able to follow exercise prescription today without complaint.  Will continue to monitor for progression.    Dr. Emily Filbert is Medical Director for [place].  Dr. Ottie Glazier is Medical Director for [facility] Pulmonary Rehabilitation.

## 2021-03-01 ENCOUNTER — Ambulatory Visit: Payer: Medicare HMO | Attending: Pulmonary Disease | Admitting: Physical Therapy

## 2021-03-01 ENCOUNTER — Other Ambulatory Visit: Payer: Self-pay

## 2021-03-01 DIAGNOSIS — R2689 Other abnormalities of gait and mobility: Secondary | ICD-10-CM | POA: Insufficient documentation

## 2021-03-01 DIAGNOSIS — R269 Unspecified abnormalities of gait and mobility: Secondary | ICD-10-CM | POA: Insufficient documentation

## 2021-03-01 DIAGNOSIS — M6281 Muscle weakness (generalized): Secondary | ICD-10-CM | POA: Diagnosis not present

## 2021-03-01 DIAGNOSIS — R293 Abnormal posture: Secondary | ICD-10-CM | POA: Diagnosis not present

## 2021-03-03 ENCOUNTER — Encounter: Payer: Self-pay | Admitting: Physical Therapy

## 2021-03-03 NOTE — Therapy (Signed)
[facility] [address]. [place], [name], [address] Phone: [phone]   Fax:  [phone]  Physical Therapy Evaluation  Patient Details  Name: Hannah Powell MRN: [id number] Date of Birth: [birth year] Referring Provider (PT): Dr. Ottie Glazier   Encounter Date: 03/01/2021   PT End of Session - 03/03/21 1216     Visit Number 1    Number of Visits 16    Date for PT Re-Evaluation 04/26/21    Authorization - Visit Number 1    Authorization - Number of Visits 10    PT Start Time 0811    PT Stop Time 0904    PT Time Calculation (min) 53 min    Equipment Utilized During Treatment Gait belt    Activity Tolerance Patient tolerated treatment well;Patient limited by fatigue    Behavior During Therapy WFL for tasks assessed/performed             Past Medical History:  Diagnosis Date   Anxiety    Arthritis    CHF (congestive heart failure) (HCC)    COPD (chronic obstructive pulmonary disease) ([place])    Dysplastic nevus 04/22/2019   R abdomen parallel to sup umbilicus - mod   Dysplastic nevus 04/22/2019   R low back above waistline - mild    Edema    feet/legs   Hypertension    dr Otho Najjar      Hypothyroidism    Leg weakness, bilateral    Osteoporosis    Oxygen deficit    2l with bipap at night   RLS (restless legs syndrome)    Shortness of breath    Sleep apnea    bipap   Wheezing     Past Surgical History:  Procedure Laterality Date   BACK SURGERY     cervical   BREAST BIOPSY Left    needle bx-neg   CATARACT EXTRACTION W/PHACO Left 05/07/2017   Procedure: CATARACT EXTRACTION PHACO AND INTRAOCULAR LENS PLACEMENT ([place]);  Surgeon: Birder Robson, MD;  Location: [facility] ORS;  Service: Ophthalmology;  Laterality: Left;  [place] 00:48.7 AP% 11.3 CDE 5.46 Fluid Pack Lot # [id number]   CATARACT EXTRACTION W/PHACO Right 04/23/2017   Procedure: CATARACT EXTRACTION PHACO AND INTRAOCULAR LENS PLACEMENT ([facility]);  Surgeon:  Birder Robson, MD;  Location: [facility];  Service: Ophthalmology;  Laterality: Right;  [place] 00:29 AP% 12.8 CDE 3.79 FLUID PACK LOT # [id number]   FOOT ARTHROPLASTY     JOINT REPLACEMENT     x2 tkr   TOTAL SHOULDER ARTHROPLASTY Left 07/02/2013   Procedure: LEFT TOTAL SHOULDER ARTHROPLASTY;  Surgeon: Marin Shutter, MD;  Location: [place];  Service: Orthopedics;  Laterality: Left;   TOTAL SHOULDER ARTHROPLASTY Right 04/10/2018   Procedure: TOTAL REVERSE SHOULDER ARTHROPLASTY;  Surgeon: Justice Britain, MD;  Location: [facility];  Service: Orthopedics;  Laterality: Right;  137min    There were no vitals filed for this visit.    Subjective Assessment - 03/03/21 1204     Subjective Pt is a 75 y.o. female referred to PT for B LE weakness 2/2 lumbar stenosis. Pt reports 0/10 pain via NPS and reports BLE weakness as her symptoms. Pt's goals are to improve her balance and to be able to independently sit to stand from low surfaces. Pt. hoping to return to Pulmonary Rehab program when appropriate.  Pt. known well to PT clinic.    Pertinent History extensive PMH.  Pt. was participating with Pulmonary Rehab and pt. states that she  is hoping to return to Pulmonary Rehab when appropriate.  Pt. states they were concerned about her LE strength/ balance getting on/off equipment.    Limitations Standing;Walking;House hold activities    Patient Stated Goals Increase B LE muscle strength/ return to Pulmonary Rehab    Currently in Pain? No/denies                [facility] PT Assessment - 03/03/21 0001       Assessment   Medical Diagnosis B LE muscle weakness    Referring Provider (PT) Dr. Ottie Glazier    Onset Date/Surgical Date 03/20/19    Prior Therapy Pt. known well to PT      Precautions   Precautions Fall      Balance Screen   Has the patient fallen in the past 6 months Yes    How many times? in June      Prior Function   Level of Independence Requires assistive device for independence       Cognition   Overall Cognitive Status Within Functional Limits for tasks assessed                OBJECTIVE   Mental Status Patient is oriented to person, place and time.  Recent memory is intact.  Remote memory is intact.  Attention span and concentration are intact.  Expressive speech is intact.  Patient's fund of knowledge is within normal limits for educational level.     MUSCULOSKELETAL: Tremor: None Bulk: Normal Tone: Normal   Posture Patient stands with increased lordosis, rounded shoulders, B hip adduction, B genu valgum, and mild pronation. Patient is generally guarded in sitting and standing.   Gait Patient ambulates with RW with decreased gait speed with concurrent L shoulder hike and R hip drop during R stance. Increased stance time on R leg. Forward head posture and forward trunk lean and relies heavily on BUE support on RW      Strength (out of 5) R/L See clinical impression     Objective measurements completed on examination: See above findings.      There ex:   Nustep L4 10 mins B UE/LE Reviewed HEP      Objective measurements completed on examination: See above findings.       PT Long Term Goals - 03/03/21 1240       PT LONG TERM GOAL #1   Title Pt. will improve 5XSTS to <15 sec to improve functional mobility.    Baseline TBD    Time 8    Period Weeks    Status New    Target Date 04/26/21      PT LONG TERM GOAL #2   Title Pt. will increase B LE muscle strength 1/2 muscle grade to improve safety/ independence with functional mobility.    Baseline neurogenic claudication. Pt. presents with good strength in bilat. hip add and abd, bilat DF, and quadriceps. Pt. has 4/5 strength in B hip flex, 4+/5 in R knee flex, and R inversion. Pt. has 4/5 strength with L knee flex, R eversion, and L inverison.    Time 8    Period Weeks    Status New    Target Date 04/26/21      PT LONG TERM GOAL #3   Title Pt. will demonstrate improved normal  gait speed to 0.4 m/s to decrease falls risk and improve ability to safely walk in community.    Baseline TBD    Time 8    Period  Weeks    Status New    Target Date 04/26/21      PT LONG TERM GOAL #4   Title Pt. will demonstrate safety with ascending 4 stairs with at most one handrail to improve community ambulation.    Baseline Pt able to ascend/ descend stairs with both handrails and lateral step to pattern.    Time 8    Period Weeks    Status New    Target Date 04/26/21                    Plan - 03/03/21 1218     Clinical Impression Statement Pt. is a 75 y.o. woman with B LE muscle weakness secondary to spinal stenosis/neurogenic claudication. Pt. presents with good strength in bilat. hip add and abd, bilat DF, and quadriceps. Pt. has 4/5 strength in B hip flex, 4+/5 in R knee flex, and R inversion.  Pt. has 4/5 strength with L knee flex, R eversion, and L inverison.  Pt. has functional ROM of LE. Pt. reports numbness in bilat LE from knees down to feet after sitting for longer periods of time.  Moderate L lower lower leg/ ankle swelling.  Poor seated/ standing posture with significant forward head/ rounded shoulder posture.  Heavy use of B UE on RW with all standing/ walking in clinic.   Pt.'s gait demonstrates following abnormalities: increased L shoulder hike and R hip drop during R stance phase, severe forward trunk lean, severe UE dependency on RW, increased stance time on R leg, and decreased gait speed.   Pt. concerned about L knee buclking with walking, esp. when fatigued.  Pt. will benefit from skilled PT to address gait abnormalities, improve balance, and improve strength to decrease falls risk and improve functional mobility/ return to Pulmonary Rehab.    Personal Factors and Comorbidities Age;Comorbidity 3+    Comorbidities CPD, HTN, Obesity    Stability/Clinical Decision Making Evolving/Moderate complexity    Clinical Decision Making Moderate    Rehab Potential Fair     PT Frequency 2x / week    PT Duration 8 weeks    PT Treatment/Interventions Cryotherapy;Electrical Stimulation;Ultrasound;Moist Heat;Gait training;Functional mobility training;Therapeutic activities;Therapeutic exercise;Neuromuscular re-education;Scar mobilization;Balance training;Stair training;Patient/family education;Manual techniques;ADLs/Self Care Home Management    PT Next Visit Plan Progress strengthening/ 5xSTS and 44m walk test    Consulted and Agree with Plan of Care Patient             Patient will benefit from skilled therapeutic intervention in order to improve the following deficits and impairments:  Decreased endurance, Decreased range of motion, Decreased skin integrity, Decreased strength, Hypomobility, Impaired UE functional use, Increased fascial restricitons, Pain, Postural dysfunction, Impaired flexibility, Decreased scar mobility, Decreased mobility, Decreased balance, Improper body mechanics, Abnormal gait, Decreased activity tolerance, Impaired sensation  Visit Diagnosis: Muscle weakness (generalized)  Gait difficulty  Abnormal posture  Imbalance     Problem List Patient Active Problem List   Diagnosis Date Noted   S/P reverse total shoulder arthroplasty, 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   Chronic diastolic heart failure ([place]) 08/09/2016   COPD (chronic obstructive pulmonary disease) ([place]) 08/09/2016   Pulmonary infiltrates    Pneumonia due to Streptococcus pneumoniae (HCC)    Fever    Community acquired pneumonia of right lung    Palliative care by specialist    Goals of care, counseling/discussion    Pressure injury of skin 07/03/2016   COPD exacerbation ([place]) 07/21/2015   Acute renal insufficiency 07/21/2015   Hyperglycemia 07/21/2015  Benign essential HTN 07/21/2015   S/P shoulder replacement 07/02/2013   Pura Spice, PT, DPT # [phone] 03/03/2021, 12:46 PM  [facility] [address] [place], [name],  [address] Phone: [phone]   Fax:  [phone]  Name: Hannah Powell MRN: [id number] Date of Birth: 09-Mar-1946

## 2021-03-06 ENCOUNTER — Encounter: Payer: Self-pay | Admitting: *Deleted

## 2021-03-06 ENCOUNTER — Other Ambulatory Visit: Payer: Self-pay

## 2021-03-06 ENCOUNTER — Ambulatory Visit: Payer: Medicare HMO | Admitting: Physical Therapy

## 2021-03-06 DIAGNOSIS — R2689 Other abnormalities of gait and mobility: Secondary | ICD-10-CM | POA: Diagnosis not present

## 2021-03-06 DIAGNOSIS — R293 Abnormal posture: Secondary | ICD-10-CM

## 2021-03-06 DIAGNOSIS — M6281 Muscle weakness (generalized): Secondary | ICD-10-CM

## 2021-03-06 DIAGNOSIS — R269 Unspecified abnormalities of gait and mobility: Secondary | ICD-10-CM

## 2021-03-06 DIAGNOSIS — J449 Chronic obstructive pulmonary disease, unspecified: Secondary | ICD-10-CM

## 2021-03-07 ENCOUNTER — Ambulatory Visit: Payer: Medicare HMO | Admitting: Physical Therapy

## 2021-03-08 ENCOUNTER — Ambulatory Visit: Payer: Medicare HMO | Admitting: Physical Therapy

## 2021-03-09 ENCOUNTER — Other Ambulatory Visit: Payer: Self-pay

## 2021-03-09 ENCOUNTER — Ambulatory Visit: Payer: Medicare HMO | Admitting: Physical Therapy

## 2021-03-09 ENCOUNTER — Encounter: Payer: Medicare HMO | Admitting: Physical Therapy

## 2021-03-09 DIAGNOSIS — R2689 Other abnormalities of gait and mobility: Secondary | ICD-10-CM

## 2021-03-09 DIAGNOSIS — M6281 Muscle weakness (generalized): Secondary | ICD-10-CM | POA: Diagnosis not present

## 2021-03-09 DIAGNOSIS — R293 Abnormal posture: Secondary | ICD-10-CM

## 2021-03-09 DIAGNOSIS — R269 Unspecified abnormalities of gait and mobility: Secondary | ICD-10-CM

## 2021-03-13 NOTE — Therapy (Signed)
[facility] [address]. [place], [name], [address] Phone: [phone]   Fax:  [phone]  Physical Therapy Treatment  Patient Details  Name: Hannah Powell MRN: [id number] Date of Birth: 1946-01-02 Referring Provider (PT): Dr. Ottie Glazier   Encounter Date: 03/06/2021   PT End of Session - 03/13/21 1729     Visit Number 2    Number of Visits 16    Date for PT Re-Evaluation 04/26/21    Authorization - Visit Number 2    Authorization - Number of Visits 10    PT Start Time 6659    PT Stop Time 1518    PT Time Calculation (min) 49 min    Equipment Utilized During Treatment Gait belt    Activity Tolerance Patient tolerated treatment well;Patient limited by fatigue    Behavior During Therapy WFL for tasks assessed/performed             Past Medical History:  Diagnosis Date   Anxiety    Arthritis    CHF (congestive heart failure) (HCC)    COPD (chronic obstructive pulmonary disease) ([place])    Dysplastic nevus 04/22/2019   R abdomen parallel to sup umbilicus - mod   Dysplastic nevus 04/22/2019   R low back above waistline - mild    Edema    feet/legs   Hypertension    dr Otho Najjar      Hypothyroidism    Leg weakness, bilateral    Osteoporosis    Oxygen deficit    2l with bipap at night   RLS (restless legs syndrome)    Shortness of breath    Sleep apnea    bipap   Wheezing     Past Surgical History:  Procedure Laterality Date   BACK SURGERY     cervical   BREAST BIOPSY Left    needle bx-neg   CATARACT EXTRACTION W/PHACO Left 05/07/2017   Procedure: CATARACT EXTRACTION PHACO AND INTRAOCULAR LENS PLACEMENT ([place]);  Surgeon: Birder Robson, MD;  Location: [facility];  Service: Ophthalmology;  Laterality: Left;  [place] 00:48.7 AP% 11.3 CDE 5.46 Fluid Pack Lot # [id number]   CATARACT EXTRACTION W/PHACO Right 04/23/2017   Procedure: CATARACT EXTRACTION PHACO AND INTRAOCULAR LENS PLACEMENT ([facility]);  Surgeon:  Birder Robson, MD;  Location: [facility];  Service: Ophthalmology;  Laterality: Right;  [place] 00:29 AP% 12.8 CDE 3.79 FLUID PACK LOT # [id number]   FOOT ARTHROPLASTY     JOINT REPLACEMENT     x2 tkr   TOTAL SHOULDER ARTHROPLASTY Left 07/02/2013   Procedure: LEFT TOTAL SHOULDER ARTHROPLASTY;  Surgeon: Marin Shutter, MD;  Location: [place];  Service: Orthopedics;  Laterality: Left;   TOTAL SHOULDER ARTHROPLASTY Right 04/10/2018   Procedure: TOTAL REVERSE SHOULDER ARTHROPLASTY;  Surgeon: Justice Britain, MD;  Location: [facility];  Service: Orthopedics;  Laterality: Right;  139min    There were no vitals filed for this visit.   Subjective Assessment - 03/13/21 1726     Subjective Pt. reports no new complaints.  Pt. hoping to return to Pulmonary Rehab when appropriate.    Pertinent History extensive PMH.  Pt. was participating with Pulmonary Rehab and pt. states that she is hoping to return to Pulmonary Rehab when appropriate.  Pt. states they were concerned about her LE strength/ balance getting on/off equipment.    Limitations Standing;Walking;House hold activities    Patient Stated Goals Increase B LE muscle strength/ return to Pulmonary Rehab    Currently in Pain?  No/denies              Ther.ex.:  Standing 3-way hip ex. In //-bars 20x each with heavy UE assist.   Walking in //-bars with high marching 3x/ lateral walking with heavy UE assist 3x.  Seated rest break.  Nustep L3 10 min. B UE/LE  Discussed HEP   Neuro:  Transfer training from gray chair with no arm rests.  Modified with use of RW and Airex pads to assist with proper positioning.   Posture correction in standing/ walking with RW.  Moderate verbal cuing for head/ shoulder positioning        PT Long Term Goals - 03/03/21 1240       PT LONG TERM GOAL #1   Title Pt. will improve 5XSTS to <15 sec to improve functional mobility.    Baseline TBD    Time 8    Period Weeks    Status New    Target Date 04/26/21       PT LONG TERM GOAL #2   Title Pt. will increase B LE muscle strength 1/2 muscle grade to improve safety/ independence with functional mobility.    Baseline neurogenic claudication. Pt. presents with good strength in bilat. hip add and abd, bilat DF, and quadriceps. Pt. has 4/5 strength in B hip flex, 4+/5 in R knee flex, and R inversion. Pt. has 4/5 strength with L knee flex, R eversion, and L inverison.    Time 8    Period Weeks    Status New    Target Date 04/26/21      PT LONG TERM GOAL #3   Title Pt. will demonstrate improved normal gait speed to 0.4 m/s to decrease falls risk and improve ability to safely walk in community.    Baseline TBD    Time 8    Period Weeks    Status New    Target Date 04/26/21      PT LONG TERM GOAL #4   Title Pt. will demonstrate safety with ascending 4 stairs with at most one handrail to improve community ambulation.    Baseline Pt able to ascend/ descend stairs with both handrails and lateral step to pattern.    Time 8    Period Weeks    Status New    Target Date 04/26/21                   Plan - 03/13/21 1729     Clinical Impression Statement Tx. focus on standing from chair without handrails to improve ability to transfer from w/c and chair at Pulmonary Rehab.  Pt. requires UE assist to stand from regular heigh chair to ensure safety.  PT modified pts. sit to stands with assist from RW.  Pt. benefits from 4-6" lift in chair to assist with stands from chair with no armrests.  B hip flexor weakness and generalized LE muscle fatigue noted as tx. progresses.    Personal Factors and Comorbidities Age;Comorbidity 3+    Comorbidities CPD, HTN, Obesity    Stability/Clinical Decision Making Evolving/Moderate complexity    Clinical Decision Making Moderate    Rehab Potential Fair    PT Frequency 2x / week    PT Duration 8 weeks    PT Treatment/Interventions Cryotherapy;Electrical Stimulation;Ultrasound;Moist Heat;Gait training;Functional  mobility training;Therapeutic activities;Therapeutic exercise;Neuromuscular re-education;Scar mobilization;Balance training;Stair training;Patient/family education;Manual techniques;ADLs/Self Care Home Management    PT Next Visit Plan Progress strengthening/ 5xSTS and 56m walk test.  Use pts. chair lift assist (pneumatic)  Consulted and Agree with Plan of Care Patient             Patient will benefit from skilled therapeutic intervention in order to improve the following deficits and impairments:  Decreased endurance, Decreased range of motion, Decreased skin integrity, Decreased strength, Hypomobility, Impaired UE functional use, Increased fascial restricitons, Pain, Postural dysfunction, Impaired flexibility, Decreased scar mobility, Decreased mobility, Decreased balance, Improper body mechanics, Abnormal gait, Decreased activity tolerance, Impaired sensation  Visit Diagnosis: Muscle weakness (generalized)  Gait difficulty  Abnormal posture  Imbalance     Problem List Patient Active Problem List   Diagnosis Date Noted   S/P reverse total shoulder arthroplasty, 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   Chronic diastolic heart failure ([place]) 08/09/2016   COPD (chronic obstructive pulmonary disease) ([place]) 08/09/2016   Pulmonary infiltrates    Pneumonia due to Streptococcus pneumoniae ([place])    Fever    Community acquired pneumonia of right lung    Palliative care by specialist    Goals of care, counseling/discussion    Pressure injury of skin 07/03/2016   COPD exacerbation ([place]) 07/21/2015   Acute renal insufficiency 07/21/2015   Hyperglycemia 07/21/2015   Benign essential HTN 07/21/2015   S/P shoulder replacement 07/02/2013   Pura Spice, PT, DPT # [id number] 03/13/2021, 5:35 PM  [facility] [address]. [place], [name], [address] Phone: [phone]   Fax:  [phone]  Name: Hannah Powell MRN: [id number] Date of Birth:  [birth year]

## 2021-03-14 ENCOUNTER — Other Ambulatory Visit: Payer: Self-pay

## 2021-03-14 ENCOUNTER — Ambulatory Visit: Payer: Medicare HMO | Admitting: Physical Therapy

## 2021-03-14 ENCOUNTER — Encounter: Payer: Self-pay | Admitting: Physical Therapy

## 2021-03-14 DIAGNOSIS — R293 Abnormal posture: Secondary | ICD-10-CM | POA: Diagnosis not present

## 2021-03-14 DIAGNOSIS — E7800 Pure hypercholesterolemia, unspecified: Secondary | ICD-10-CM | POA: Diagnosis not present

## 2021-03-14 DIAGNOSIS — Z79899 Other long term (current) drug therapy: Secondary | ICD-10-CM | POA: Diagnosis not present

## 2021-03-14 DIAGNOSIS — R2689 Other abnormalities of gait and mobility: Secondary | ICD-10-CM

## 2021-03-14 DIAGNOSIS — R269 Unspecified abnormalities of gait and mobility: Secondary | ICD-10-CM

## 2021-03-14 DIAGNOSIS — I10 Essential (primary) hypertension: Secondary | ICD-10-CM | POA: Diagnosis not present

## 2021-03-14 DIAGNOSIS — N1831 Chronic kidney disease, stage 3a: Secondary | ICD-10-CM | POA: Diagnosis not present

## 2021-03-14 DIAGNOSIS — E034 Atrophy of thyroid (acquired): Secondary | ICD-10-CM | POA: Diagnosis not present

## 2021-03-14 DIAGNOSIS — M6281 Muscle weakness (generalized): Secondary | ICD-10-CM | POA: Diagnosis not present

## 2021-03-14 NOTE — Therapy (Signed)
[facility] [address]. [place], [name], [address] Phone: [phone]   Fax:  [phone]  Physical Therapy Treatment  Patient Details  Name: Hannah Powell MRN: [id number] Date of Birth: 06/20/1945 Referring Provider (PT): Dr. Ottie Glazier   Encounter Date: 03/09/2021   PT End of Session - 03/14/21 1020     Visit Number 3    Number of Visits 16    Date for PT Re-Evaluation 04/26/21    Authorization - Visit Number 3    Authorization - Number of Visits 10    PT Start Time 1606    PT Stop Time 1644    PT Time Calculation (min) 38 min    Equipment Utilized During Treatment Gait belt    Activity Tolerance Patient tolerated treatment well;Patient limited by fatigue    Behavior During Therapy WFL for tasks assessed/performed             Past Medical History:  Diagnosis Date   Anxiety    Arthritis    CHF (congestive heart failure) (HCC)    COPD (chronic obstructive pulmonary disease) ([place])    Dysplastic nevus 04/22/2019   R abdomen parallel to sup umbilicus - mod   Dysplastic nevus 04/22/2019   R low back above waistline - mild    Edema    feet/legs   Hypertension    dr Otho Najjar      Hypothyroidism    Leg weakness, bilateral    Osteoporosis    Oxygen deficit    2l with bipap at night   RLS (restless legs syndrome)    Shortness of breath    Sleep apnea    bipap   Wheezing     Past Surgical History:  Procedure Laterality Date   BACK SURGERY     cervical   BREAST BIOPSY Left    needle bx-neg   CATARACT EXTRACTION W/PHACO Left 05/07/2017   Procedure: CATARACT EXTRACTION PHACO AND INTRAOCULAR LENS PLACEMENT ([place]);  Surgeon: Birder Robson, MD;  Location: [facility];  Service: Ophthalmology;  Laterality: Left;  [place] 00:48.7 AP% 11.3 CDE 5.46 Fluid Pack Lot # [id number] H   CATARACT EXTRACTION W/PHACO Right 04/23/2017   Procedure: CATARACT EXTRACTION PHACO AND INTRAOCULAR LENS PLACEMENT ([facility]);  Surgeon:  Birder Robson, MD;  Location: [facility];  Service: Ophthalmology;  Laterality: Right;  [place] 00:29 AP% 12.8 CDE 3.79 FLUID PACK LOT # [id number]   FOOT ARTHROPLASTY     JOINT REPLACEMENT     x2 tkr   TOTAL SHOULDER ARTHROPLASTY Left 07/02/2013   Procedure: LEFT TOTAL SHOULDER ARTHROPLASTY;  Surgeon: Marin Shutter, MD;  Location: [place];  Service: Orthopedics;  Laterality: Left;   TOTAL SHOULDER ARTHROPLASTY Right 04/10/2018   Procedure: TOTAL REVERSE SHOULDER ARTHROPLASTY;  Surgeon: Justice Britain, MD;  [facility];  Service: Orthopedics;  Laterality: Right;  13min    There were no vitals filed for this visit.   Subjective Assessment - 03/14/21 1018     Subjective Pt. rescheduled Wednesday PT appt. and rescheduled for today due to increase back discomfort.  Pt. states back pain is doing better.  Pt. reports being really tired today.    Pertinent History extensive PMH.  Pt. was participating with Pulmonary Rehab and pt. states that she is hoping to return to Pulmonary Rehab when appropriate.  Pt. states they were concerned about her LE strength/ balance getting on/off equipment.    Limitations Standing;Walking;House hold activities    Patient Stated Goals Increase  B LE muscle strength/ return to Pulmonary Rehab    Currently in Pain? Yes   no subjective pain score given   Pain Location Back             Ther.ex.: (use of gait belt with all standing/ transfer ex).     Standing 3-way hip ex. In //-bars 20x each with heavy UE assist.    Walking in //-bars with high marching 3x/ lateral walking with heavy UE assist 3x.  Seated rest break.  Use of pneumatic seat assist with sit to stands from gray chair.  Pt. Benefits from chair assist when not using UE assist on handrails.  Moderate B LE muscle fatigue noted.    Nustep L3 10 min. B UE/LE     PT Long Term Goals - 03/03/21 1240       PT LONG TERM GOAL #1   Title Pt. will improve 5XSTS to <15 sec to improve functional mobility.     Baseline TBD    Time 8    Period Weeks    Status New    Target Date 04/26/21      PT LONG TERM GOAL #2   Title Pt. will increase B LE muscle strength 1/2 muscle grade to improve safety/ independence with functional mobility.    Baseline neurogenic claudication. Pt. presents with good strength in bilat. hip add and abd, bilat DF, and quadriceps. Pt. has 4/5 strength in B hip flex, 4+/5 in R knee flex, and R inversion. Pt. has 4/5 strength with L knee flex, R eversion, and L inverison.    Time 8    Period Weeks    Status New    Target Date 04/26/21      PT LONG TERM GOAL #3   Title Pt. will demonstrate improved normal gait speed to 0.4 m/s to decrease falls risk and improve ability to safely walk in community.    Baseline TBD    Time 8    Period Weeks    Status New    Target Date 04/26/21      PT LONG TERM GOAL #4   Title Pt. will demonstrate safety with ascending 4 stairs with at most one handrail to improve community ambulation.    Baseline Pt able to ascend/ descend stairs with both handrails and lateral step to pattern.    Time 8    Period Weeks    Status New    Target Date 04/26/21                   Plan - 03/14/21 1022     Clinical Impression Statement Tx. progression limited by increase generalized muscle fatigue today.  Pt. 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  Pt. benefits from assist when standing from chair and requires UE assist.  CGA with all aspects of walking/ standing ther.ex. secondary increase LE muscle fatigue/ fear of knee buckling.  Pt. requires UE assist with all aspects of walking due to LE weakness.    Personal Factors and Comorbidities Age;Comorbidity 3+    Comorbidities CPD, HTN, Obesity    Stability/Clinical Decision Making Evolving/Moderate complexity    Clinical Decision Making Moderate    Rehab Potential Fair    PT Frequency 2x / week    PT Duration 8 weeks    PT Treatment/Interventions  Cryotherapy;Electrical Stimulation;Ultrasound;Moist Heat;Gait training;Functional mobility training;Therapeutic activities;Therapeutic exercise;Neuromuscular re-education;Scar mobilization;Balance training;Stair training;Patient/family education;Manual techniques;ADLs/Self Care Home Management  PT Next Visit Plan Progress strengthening/ 5xSTS and 6m walk test.    Consulted and Agree with Plan of Care Patient             Patient will benefit from skilled therapeutic intervention in order to improve the following deficits and impairments:  Decreased endurance, Decreased range of motion, Decreased skin integrity, Decreased strength, Hypomobility, Impaired UE functional use, Increased fascial restricitons, Pain, Postural dysfunction, Impaired flexibility, Decreased scar mobility, Decreased mobility, Decreased balance, Improper body mechanics, Abnormal gait, Decreased activity tolerance, Impaired sensation  Visit Diagnosis: Muscle weakness (generalized)  Gait difficulty  Abnormal posture  Imbalance     Problem List Patient Active Problem List   Diagnosis Date Noted   S/P reverse total shoulder arthroplasty, 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   Chronic diastolic heart failure ([place]) 08/09/2016   COPD (chronic obstructive pulmonary disease) ([place]) 08/09/2016   Pulmonary infiltrates    Pneumonia due to Streptococcus pneumoniae ([place])    Fever    Community acquired pneumonia of right lung    Palliative care by specialist    Goals of care, counseling/discussion    Pressure injury of skin 07/03/2016   COPD exacerbation ([place]) 07/21/2015   Acute renal insufficiency 07/21/2015   Hyperglycemia 07/21/2015   Benign essential HTN 07/21/2015   S/P shoulder replacement 07/02/2013   Pura Spice, PT, DPT # [phone] 03/14/2021, 10:25 AM  [facility] [address]. [place], [name], [address] Phone: [phone]   Fax:  [phone]  Name: Darl Brisbin MRN: [id number] Date of Birth: 11/21/1945

## 2021-03-14 NOTE — Therapy (Signed)
[facility] [address]. [place], [name], [address] Phone: [phone]   Fax:  [phone]  Physical Therapy Treatment  Patient Details  Name: Hannah Powell MRN: [id number] Date of Birth: February 03, 1946 Referring Provider (PT): Dr. Ottie Glazier   Encounter Date: 03/14/2021   PT End of Session - 03/14/21 1048     Visit Number 4    Number of Visits 16    Date for PT Re-Evaluation 04/26/21    Authorization - Visit Number 4    Authorization - Number of Visits 10    PT Start Time 1040    Equipment Utilized During Treatment Gait belt    Activity Tolerance Patient tolerated treatment well;Patient limited by fatigue    Behavior During Therapy [facility] for tasks assessed/performed             Past Medical History:  Diagnosis Date   Anxiety    Arthritis    CHF (congestive heart failure) (HCC)    COPD (chronic obstructive pulmonary disease) ([place])    Dysplastic nevus 04/22/2019   R abdomen parallel to sup umbilicus - mod   Dysplastic nevus 04/22/2019   R low back above waistline - mild    Edema    feet/legs   Hypertension    dr Otho Najjar      Hypothyroidism    Leg weakness, bilateral    Osteoporosis    Oxygen deficit    2l with bipap at night   RLS (restless legs syndrome)    Shortness of breath    Sleep apnea    bipap   Wheezing     Past Surgical History:  Procedure Laterality Date   BACK SURGERY     cervical   BREAST BIOPSY Left    needle bx-neg   CATARACT EXTRACTION W/PHACO Left 05/07/2017   Procedure: CATARACT EXTRACTION PHACO AND INTRAOCULAR LENS PLACEMENT ([place]);  Surgeon: Birder Robson, MD;  Location: [facility];  Service: Ophthalmology;  Laterality: Left;  [place] 00:48.7 AP% 11.3 CDE 5.46 Fluid Pack Lot # [id number] H   CATARACT EXTRACTION W/PHACO Right 04/23/2017   Procedure: CATARACT EXTRACTION PHACO AND INTRAOCULAR LENS PLACEMENT ([facility]);  Surgeon: Birder Robson, MD;  Location: [facility];  Service:  Ophthalmology;  Laterality: Right;  [place] 00:29 AP% 12.8 CDE 3.79 FLUID PACK LOT # [id number]   FOOT ARTHROPLASTY     JOINT REPLACEMENT     x2 tkr   TOTAL SHOULDER ARTHROPLASTY Left 07/02/2013   Procedure: LEFT TOTAL SHOULDER ARTHROPLASTY;  Surgeon: Marin Shutter, MD;  Location: [place];  Service: Orthopedics;  Laterality: Left;   TOTAL SHOULDER ARTHROPLASTY Right 04/10/2018   Procedure: TOTAL REVERSE SHOULDER ARTHROPLASTY;  Surgeon: Justice Britain, MD;  [facility];  Service: Orthopedics;  Laterality: Right;  151min    There were no vitals filed for this visit.   Subjective Assessment - 03/14/21 1047     Subjective Pt. went to the gym yesterday and rode the bike 30 minutes and did standing ther.ex. (hip flexion/ extension).  Pt. reports discomfort in low back but not pain.  Pt. using 3# ankle wts. for LE ex.    Pertinent History extensive PMH.  Pt. was participating with Pulmonary Rehab and pt. states that she is hoping to return to Pulmonary Rehab when appropriate.  Pt. states they were concerned about her LE strength/ balance getting on/off equipment.    Limitations Standing;Walking;House hold activities    Patient Stated Goals Increase B LE muscle strength/ return to Pulmonary  Rehab    Currently in Pain? No/denies              Ther.ex.:  Walking in hallway with use of RW and moderate cuing to correct posture.  Forward/backwards/lateral walking in //-bars 2x each direction with cuing to maintain upright posture/ head position.  CGA with gait belt for safety.    5xSTS (handrail assist required in gray chair):  18.5 sec. (1st step), 15.9 sec. (2nd attempt).  Seated B shoulder wand ex.: flexion/ chest press 20x.    Standing knee flexion/ heel raises 20x.    Adjusted RW 1 notch higher to improve standing posture/ walking.     Nustep L3 10 min. Seat 10 for 10 min. With B UE/LE.    Walking out to car with use of RW and moderate cuing to correct posture/ step pattern.  Pt.  Challenged with ramp outside/ curb step downs.  Extra time and standing rest break required.     PT Long Term Goals - 03/03/21 1240       PT LONG TERM GOAL #1   Title Pt. will improve 5XSTS to <15 sec to improve functional mobility.    Baseline TBD    Time 8    Period Weeks    Status New    Target Date 04/26/21      PT LONG TERM GOAL #2   Title Pt. will increase B LE muscle strength 1/2 muscle grade to improve safety/ independence with functional mobility.    Baseline neurogenic claudication. Pt. presents with good strength in bilat. hip add and abd, bilat DF, and quadriceps. Pt. has 4/5 strength in B hip flex, 4+/5 in R knee flex, and R inversion. Pt. has 4/5 strength with L knee flex, R eversion, and L inverison.    Time 8    Period Weeks    Status New    Target Date 04/26/21      PT LONG TERM GOAL #3   Title Pt. will demonstrate improved normal gait speed to 0.4 m/s to decrease falls risk and improve ability to safely walk in community.    Baseline TBD    Time 8    Period Weeks    Status New    Target Date 04/26/21      PT LONG TERM GOAL #4   Title Pt. will demonstrate safety with ascending 4 stairs with at most one handrail to improve community ambulation.    Baseline Pt able to ascend/ descend stairs with both handrails and lateral step to pattern.    Time 8    Period Weeks    Status New    Target Date 04/26/21                   Plan - 03/14/21 1048     Clinical Impression Statement Moderate LE muscle fatigue noted during //-bar ex. with several short seated rest breaks.  Pt. fearful of L knee buckling with increase activity/ fatigue during tasks.  No increase c/o back pain during tx. session.  Pt. benefits from elevating RW 1 notch.  Extra time and standing rest break required when walking from PT gym to car at end of tx. session.  No change to HEP and pt. will return to gym tomorrow with husband.    Personal Factors and Comorbidities Age;Comorbidity 3+     Comorbidities CPD, HTN, Obesity    Stability/Clinical Decision Making Evolving/Moderate complexity    Clinical Decision Making Moderate    Rehab Potential  Fair    PT Frequency 2x / week    PT Duration 8 weeks    PT Treatment/Interventions Cryotherapy;Electrical Stimulation;Ultrasound;Moist Heat;Gait training;Functional mobility training;Therapeutic activities;Therapeutic exercise;Neuromuscular re-education;Scar mobilization;Balance training;Stair training;Patient/family education;Manual techniques;ADLs/Self Care Home Management    PT Next Visit Plan Dynamic balance tasks/ outside walking on ramp/ curb    Consulted and Agree with Plan of Care Patient             Patient will benefit from skilled therapeutic intervention in order to improve the following deficits and impairments:  Decreased endurance, Decreased range of motion, Decreased skin integrity, Decreased strength, Hypomobility, Impaired UE functional use, Increased fascial restricitons, Pain, Postural dysfunction, Impaired flexibility, Decreased scar mobility, Decreased mobility, Decreased balance, Improper body mechanics, Abnormal gait, Decreased activity tolerance, Impaired sensation  Visit Diagnosis: Muscle weakness (generalized)  Gait difficulty  Abnormal posture  Imbalance     Problem List Patient Active Problem List   Diagnosis Date Noted   S/P reverse total shoulder arthroplasty, 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   Chronic diastolic heart failure ([place]) 08/09/2016   COPD (chronic obstructive pulmonary disease) ([place]) 08/09/2016   Pulmonary infiltrates    Pneumonia due to Streptococcus pneumoniae (HCC)    Fever    Community acquired pneumonia of right lung    Palliative care by specialist    Goals of care, counseling/discussion    Pressure injury of skin 07/03/2016   COPD exacerbation ([place]) 07/21/2015   Acute renal insufficiency 07/21/2015   Hyperglycemia 07/21/2015   Benign essential HTN 07/21/2015   S/P shoulder replacement  07/02/2013   Pura Spice, PT, DPT # [id number] 03/14/2021, 11:31 AM  [facility] [address]. [place], [name], [address] Phone: [phone]   Fax:  [phone]  Name: Hannah Powell MRN: [id number] Date of Birth: 02-24-1946

## 2021-03-15 ENCOUNTER — Encounter: Payer: Self-pay | Admitting: *Deleted

## 2021-03-15 DIAGNOSIS — J449 Chronic obstructive pulmonary disease, unspecified: Secondary | ICD-10-CM

## 2021-03-15 NOTE — Progress Notes (Signed)
Pulmonary Individual Treatment Plan  Patient Details  Name: Hannah Powell MRN: [id number] Date of Birth: 1945-04-15 Referring Provider:   Flowsheet Row Pulmonary Rehab from 01/10/2021 in [facility] Cardiac and Pulmonary Rehab  Referring Provider Ottie Glazier MD       Initial Encounter Date:  Flowsheet Row Pulmonary Rehab from 01/10/2021 in [facility] Cardiac and Pulmonary Rehab  Date 01/10/21       Visit Diagnosis: Chronic obstructive pulmonary disease, unspecified COPD type ([place])  Patient's Home Medications on Admission:  Current Outpatient Medications:    albuterol (PROVENTIL) (2.5 MG/3ML) 0.083% nebulizer solution, Take 2.5 mg by nebulization every 6 (six) hours as needed for wheezing or shortness of breath., Disp: , Rfl:    ALPRAZolam (XANAX) 0.25 MG tablet, Take 1 tablet (0.25 mg total) by mouth 3 (three) times daily as needed for anxiety., Disp: 30 tablet, Rfl: 0   ALPRAZolam (XANAX) 0.25 MG tablet, Take 1 tablet by mouth 3 (three) times daily as needed. (Patient not taking: Reported on 12/05/2020), Disp: , Rfl:    amoxicillin (AMOXIL) 500 MG capsule, amoxicillin 500 mg capsule (Patient not taking: Reported on 12/05/2020), Disp: , Rfl:    azelastine (ASTELIN) 0.1 % nasal spray, , Disp: , Rfl:    azithromycin (ZITHROMAX) 250 MG tablet, TAKE 1 TABLET (250 MG TOTAL) BY MOUTH DAILY., Disp: 90 tablet, Rfl: 1   B Complex-C (B-COMPLEX WITH VITAMIN C) tablet, Take 1 tablet by mouth daily. , Disp: , Rfl:    calcium carbonate (OSCAL) 1500 (600 Ca) MG TABS tablet, Take by mouth., Disp: , Rfl:    Calcium Carbonate-Vitamin D (CALCIUM-VITAMIN D) 500-200 MG-UNIT per tablet, Take 1 tablet by mouth daily., Disp: , Rfl:    cyclobenzaprine (FLEXERIL) 10 MG tablet, Take 10 mg by mouth 3 (three) times daily as needed for muscle spasms.  (Patient not taking: Reported on 12/05/2020), Disp: , Rfl:    ferrous sulfate 325 (65 FE) MG tablet, Take 325 mg by mouth daily with breakfast., Disp: , Rfl:     fluticasone furoate-vilanterol (BREO ELLIPTA) 200-25 MCG/INH AEPB, Inhale 1 puff into the lungs daily. (Patient not taking: Reported on 12/05/2020), Disp: 1 each, Rfl: 0   fluticasone furoate-vilanterol (BREO ELLIPTA) 200-25 MCG/INH AEPB, Inhale 1 puff into the lungs daily., Disp: 60 each, Rfl: 3   fluticasone furoate-vilanterol (BREO ELLIPTA) 200-25 MCG/INH AEPB, Inhale into the lungs. (Patient not taking: Reported on 12/05/2020), Disp: , Rfl:    furosemide (LASIX) 20 MG tablet, Take 2 tablets (40 mg total) by mouth 2 (two) times daily. (Patient not taking: Reported on 12/05/2020), Disp: , Rfl:    furosemide (LASIX) 80 MG tablet, Take by mouth., Disp: , Rfl:    gabapentin (NEURONTIN) 300 MG capsule, Take 300 mg by mouth at bedtime.  (Patient not taking: Reported on 12/05/2020), Disp: , Rfl:    ibuprofen (ADVIL) 800 MG tablet, Take by mouth. (Patient not taking: Reported on 12/05/2020), Disp: , Rfl:    ibuprofen (ADVIL,MOTRIN) 200 MG tablet, Take 800 mg by mouth every 6 (six) hours as needed for headache or mild pain. , Disp: , Rfl:    ipratropium-albuterol (DUONEB) 0.5-2.5 (3) MG/3ML SOLN, Take 3 mLs by nebulization every 4 (four) hours as needed., Disp: , Rfl:    ketoconazole (NIZORAL) 2 % cream, Apply once daily under the breast and left under arm., Disp: 60 g, Rfl: 1   levalbuterol (XOPENEX) 0.31 MG/3ML nebulizer solution, Inhale into the lungs., Disp: , Rfl:    levothyroxine (SYNTHROID) 112 MCG  tablet, Take 112 mcg by mouth daily before breakfast. Take 2 tabs po QD (Patient not taking: Reported on 12/05/2020), Disp: , Rfl:    levothyroxine (SYNTHROID) 112 MCG tablet, Take by mouth., Disp: , Rfl:    levothyroxine (SYNTHROID, LEVOTHROID) 200 MCG tablet, Take 200 mcg by mouth daily before breakfast., Disp: , Rfl:    loratadine-pseudoephedrine (CLARITIN-D 24-HOUR) 10-240 MG 24 hr tablet, Take 1 tablet by mouth daily as needed for allergies., Disp: , Rfl:    losartan (COZAAR) 25 MG tablet, Take 25 mg by  mouth daily., Disp: , Rfl:    losartan (COZAAR) 25 MG tablet, Take 1 tablet by mouth daily. (Patient not taking: Reported on 12/05/2020), Disp: , Rfl:    Multiple Vitamins-Minerals (MULTIVITAMIN WOMEN PO), Take 1 tablet by mouth daily., Disp: , Rfl:    Omega-3 Fatty Acids (OMEGA 3 PO), Take 1 capsule by mouth daily., Disp: , Rfl:    oxybutynin (DITROPAN) 5 MG tablet, Take 5 mg by mouth 3 (three) times daily. (Patient not taking: Reported on 12/05/2020), Disp: , Rfl:    oxybutynin (DITROPAN) 5 MG tablet, Take 1 tablet by mouth 3 (three) times daily., Disp: , Rfl:    potassium chloride SA (K-DUR,KLOR-CON) 20 MEQ tablet, Take 20 mEq by mouth 2 (two) times daily.  (Patient not taking: Reported on 12/05/2020), Disp: , Rfl:    potassium chloride SA (KLOR-CON) 20 MEQ tablet, Take 1 tablet by mouth 2 (two) times daily., Disp: , Rfl:    pramipexole (MIRAPEX) 1 MG tablet, Take 4 mg by mouth at bedtime. , Disp: , Rfl:    spironolactone (ALDACTONE) 25 MG tablet, Take 25 mg by mouth 2 (two) times daily. , Disp: , Rfl:    spironolactone (ALDACTONE) 25 MG tablet, Take 1 tablet by mouth 2 (two) times daily. (Patient not taking: Reported on 12/05/2020), Disp: , Rfl:    topiramate (TOPAMAX) 100 MG tablet, Take 100 mg by mouth 2 (two) times daily., Disp: , Rfl:    topiramate (TOPAMAX) 100 MG tablet, Take by mouth. (Patient not taking: Reported on 12/05/2020), Disp: , Rfl:    traMADol (ULTRAM) 50 MG tablet, Take 1 tablet (50 mg total) by mouth every 8 (eight) hours as needed (for mild pain)., Disp: 30 tablet, Rfl: 0   traMADol (ULTRAM) 50 MG tablet, Take 1 tablet by mouth every 8 (eight) hours as needed. (Patient not taking: Reported on 12/05/2020), Disp: , Rfl:    zolpidem (AMBIEN) 10 MG tablet, Take 5 mg by mouth at bedtime as needed for sleep., Disp: , Rfl:   Past Medical History: Past Medical History:  Diagnosis Date   Anxiety    Arthritis    CHF (congestive heart failure) (HCC)    COPD (chronic obstructive  pulmonary disease) ([place])    Dysplastic nevus 04/22/2019   R abdomen parallel to sup umbilicus - mod   Dysplastic nevus 04/22/2019   R low back above waistline - mild    Edema    feet/legs   Hypertension    dr Otho Najjar      Hypothyroidism    Leg weakness, bilateral    Osteoporosis    Oxygen deficit    2l with bipap at night   RLS (restless legs syndrome)    Shortness of breath    Sleep apnea    bipap   Wheezing     Tobacco Use: Social History   Tobacco Use  Smoking Status Former   Packs/day: 1.00   Years: 10.00  Pack years: 10.00   Types: Cigarettes   Quit date: 03/20/1975   Years since quitting: 46.0  Smokeless Tobacco Never    Labs: Recent Review Flowsheet Data     Labs for ITP Cardiac and Pulmonary Rehab Latest Ref Rng & Units 07/15/2016 07/15/2016 07/17/2016 08/01/2016 11/17/2020   Trlycerides <150 mg/dL - - 142 - -   Hemoglobin A1c 4.0 - 6.0 % - - - - -   PHART 7.350 - 7.450 7.44 7.52(H) - - 7.39   PCO2ART 32.0 - 48.0 mmHg 91(HH) 71(HH) - - 55(H)   HCO3 20.0 - 28.0 mmol/L 61.8(H) 58.0(H) - 54.6(H) 33.3(H)   ACIDBASEDEF 0.0 - 2.0 mmol/L - - - - -   O2SAT % 97.2 94.9 - 67.1 90.7        Pulmonary Assessment Scores:  Pulmonary Assessment Scores     Row Name 01/10/21 1126         ADL UCSD   ADL Phase Entry     SOB Score total 11     Rest 0     Walk 1     Stairs 0  didn't complete as she stated she does not do them     Bath 0     Dress 1     Shop 1       CAT Score   CAT Score 9       mMRC Score   mMRC Score 0              UCSD: Self-administered rating of dyspnea associated with activities of daily living (ADLs) 6-point scale (0 = "not at all" to 5 = "maximal or unable to do because of breathlessness")  Scoring Scores range from 0 to 120.  Minimally important difference is 5 units  CAT: CAT can identify the health impairment of COPD patients and is better correlated with disease progression.  CAT has a scoring range of zero to 40. The CAT  score is classified into four groups of low (less than 10), medium (10 - 20), high (21-30) and very high (31-40) based on the impact level of disease on health status. A CAT score over 10 suggests significant symptoms.  A worsening CAT score could be explained by an exacerbation, poor medication adherence, poor inhaler technique, or progression of COPD or comorbid conditions.  CAT MCID is 2 points  mMRC: mMRC (Modified Medical Research Council) Dyspnea Scale is used to assess the degree of baseline functional disability in patients of respiratory disease due to dyspnea. No minimal important difference is established. A decrease in score of 1 point or greater is considered a positive change.   Pulmonary Function Assessment:  Pulmonary Function Assessment - 12/05/20 1118       Breath   Shortness of Breath Yes;Limiting activity             Exercise Target Goals: Exercise Program Goal: Individual exercise prescription set using results from initial 6 min walk test and THRR while considering  patients activity barriers and safety.   Exercise Prescription Goal: Initial exercise prescription builds to 30-45 minutes a day of aerobic activity, 2-3 days per week.  Home exercise guidelines will be given to patient during program as part of exercise prescription that the participant will acknowledge.  Education: Aerobic Exercise: - Group verbal and visual presentation on the components of exercise prescription. Introduces F.I.T.T principle from ACSM for exercise prescriptions.  Reviews F.I.T.T. principles of aerobic exercise including progression. Written material given at graduation. Flowsheet  Row Pulmonary Rehab from 02/22/2021 in [facility] Cardiac and Pulmonary Rehab  Education need identified 01/10/21       Education: Resistance Exercise: - Group verbal and visual presentation on the components of exercise prescription. Introduces F.I.T.T principle from ACSM for exercise prescriptions  Reviews  F.I.T.T. principles of resistance exercise including progression. Written material given at graduation.    Education: Exercise & Equipment Safety: - Individual verbal instruction and demonstration of equipment use and safety with use of the equipment. Flowsheet Row Pulmonary Rehab from 12/05/2020 in [facility] Card[facility] Pulmonary Rehab  Date 12/05/20  Educator [facility]  Instruction Review Code 1- Verbalizes Understanding       Education: Exercise Physiology & General Exercise Guidelines: - Group verbal and written instruction with models to review the exercise physiology of the cardiovascular system and associated critical values. Provides general exercise guidelines with specific guidelines to those with heart or lung disease.    Education: Flexibility, Balance, Mind/Body Relaxation: - Group verbal and visual presentation with interactive activity on the components of exercise prescription. Introduces F.I.T.T principle from ACSM for exercise prescriptions. Reviews F.I.T.T. principles of flexibility and balance exercise training including progression. Also discusses the mind body connection.  Reviews various relaxation techniques to help reduce and manage stress (i.e. Deep breathing, progressive muscle relaxation, and visualization). Balance handout provided to take home. Written material given at graduation. Flowsheet Row Pulmonary Rehab from 02/22/2021 in [facility] Cardiac and Pulmonary Rehab  Date 01/25/21  Educator AS  Instruction Review Code 1- Verbalizes Understanding       Activity Barriers & Risk Stratification:   6 Minute Walk:  6 Minute Walk     Row Name 01/10/21 1142         6 Minute Walk   Phase Initial     Distance 165 feet     Walk Time 0 minutes     # of Rest Breaks 0     MPH 0.31     METS 0.17     RPE 13     Perceived Dyspnea  1     VO2 Peak 0.62     Symptoms Yes (comment)     Comments Legs fatigued     Resting HR 72 bpm     Resting BP 124/78     Resting Oxygen  Saturation  95 %     Exercise Oxygen Saturation  during 6 min walk 85 %     Max Ex. HR 106 bpm     Max Ex. BP 152/76     2 Minute Post BP 130/76       Interval HR   1 Minute HR 93     2 Minute HR 97     3 Minute HR 98     4 Minute HR 106     5 Minute HR 105     6 Minute HR 100     2 Minute Post HR 75     Interval Heart Rate? Yes       Interval Oxygen   Interval Oxygen? Yes     Baseline Oxygen Saturation % 95 %     1 Minute Oxygen Saturation % 90 %     1 Minute Liters of Oxygen 0 L  RA     2 Minute Oxygen Saturation % 87 %     2 Minute Liters of Oxygen 0 L     3 Minute Oxygen Saturation % 88 %     3 Minute Liters of Oxygen  0 L     4 Minute Oxygen Saturation % 89 %     4 Minute Liters of Oxygen 0 L     5 Minute Oxygen Saturation % 89 %     5 Minute Liters of Oxygen 0 L     6 Minute Oxygen Saturation % 85 %     6 Minute Liters of Oxygen 0 L     2 Minute Post Oxygen Saturation % 93 %     2 Minute Post Liters of Oxygen 0 L             Oxygen Initial Assessment:  Oxygen Initial Assessment - 02/01/21 1341       Home Oxygen   Home Oxygen Device Home Concentrator;Portable Concentrator    Sleep Oxygen Prescription BiPAP    Liters per minute 2    Home Exercise Oxygen Prescription None    Home Resting Oxygen Prescription None    Compliance with Home Oxygen Use Yes      Initial 6 min Walk   Oxygen Used None      Program Oxygen Prescription   Program Oxygen Prescription None      Intervention   Short Term Goals To learn and exhibit compliance with exercise, home and travel O2 prescription;To learn and understand importance of monitoring SPO2 with pulse oximeter and demonstrate accurate use of the pulse oximeter.;To learn and understand importance of maintaining oxygen saturations>88%;To learn and demonstrate proper pursed lip breathing techniques or other breathing techniques.     Long  Term Goals Exhibits compliance with exercise, home  and travel O2  prescription;Verbalizes importance of monitoring SPO2 with pulse oximeter and return demonstration;Maintenance of O2 saturations>88%;Exhibits proper breathing techniques, such as pursed lip breathing or other method taught during program session             Oxygen Re-Evaluation:  Oxygen Re-Evaluation     Row Name 01/23/21 1355 02/01/21 1341 02/20/21 1355         Program Oxygen Prescription   Program Oxygen Prescription None -- None       Home Oxygen   Home Oxygen Device Home Concentrator;Portable Concentrator -- Home Concentrator;Portable Concentrator     Sleep Oxygen Prescription BiPAP -- BiPAP     Liters per minute 2 -- 2     Home Exercise Oxygen Prescription None -- None     Home Resting Oxygen Prescription None -- None     Compliance with Home Oxygen Use Yes -- Yes       Goals/Expected Outcomes   Short Term Goals To learn and exhibit compliance with exercise, home and travel O2 prescription;To learn and understand importance of monitoring SPO2 with pulse oximeter and demonstrate accurate use of the pulse oximeter.;To learn and understand importance of maintaining oxygen saturations>88%;To learn and demonstrate proper pursed lip breathing techniques or other breathing techniques.  -- To learn and exhibit compliance with exercise, home and travel O2 prescription;To learn and understand importance of monitoring SPO2 with pulse oximeter and demonstrate accurate use of the pulse oximeter.;To learn and understand importance of maintaining oxygen saturations>88%;To learn and demonstrate proper pursed lip breathing techniques or other breathing techniques.      Long  Term Goals Exhibits compliance with exercise, home  and travel O2 prescription;Verbalizes importance of monitoring SPO2 with pulse oximeter and return demonstration;Maintenance of O2 saturations>88%;Exhibits proper breathing techniques, such as pursed lip breathing or other method taught during program session -- Exhibits  compliance with exercise, home  and travel O2  prescription;Verbalizes importance of monitoring SPO2 with pulse oximeter and return demonstration;Maintenance of O2 saturations>88%;Exhibits proper breathing techniques, such as pursed lip breathing or other method taught during program session     Comments Reviewed PLB technique with pt.  Talked about how it works and it's importance in maintaining their exercise saturations. Hannah Powell uses her pulse ox at home and is aware to have her oxygen stay above 88%. She knows how to practice PLB when needed and when she feels SOB. Hannah Powell uses her pulse ox at home and is aware to have her oxygen stay above 88%. She knows how to practice PLB when needed and when she feels shortness of breath, she feels this helps.     Goals/Expected Outcomes Short: Become more profiecient at using PLB.   Long: Become independent at using PLB. Short: Continue monitoring O2 at home Long: Become independent using PLB Short: Continue monitoring O2 at home Long: Become independent using PLB              Oxygen Discharge (Final Oxygen Re-Evaluation):  Oxygen Re-Evaluation - 02/20/21 1355       Program Oxygen Prescription   Program Oxygen Prescription None      Home Oxygen   Home Oxygen Device Home Concentrator;Portable Concentrator    Sleep Oxygen Prescription BiPAP    Liters per minute 2    Home Exercise Oxygen Prescription None    Home Resting Oxygen Prescription None    Compliance with Home Oxygen Use Yes      Goals/Expected Outcomes   Short Term Goals To learn and exhibit compliance with exercise, home and travel O2 prescription;To learn and understand importance of monitoring SPO2 with pulse oximeter and demonstrate accurate use of the pulse oximeter.;To learn and understand importance of maintaining oxygen saturations>88%;To learn and demonstrate proper pursed lip breathing techniques or other breathing techniques.     Long  Term Goals Exhibits compliance with exercise, home   and travel O2 prescription;Verbalizes importance of monitoring SPO2 with pulse oximeter and return demonstration;Maintenance of O2 saturations>88%;Exhibits proper breathing techniques, such as pursed lip breathing or other method taught during program session    Comments Hannah Powell uses her pulse ox at home and is aware to have her oxygen stay above 88%. She knows how to practice PLB when needed and when she feels shortness of breath, she feels this helps.    Goals/Expected Outcomes Short: Continue monitoring O2 at home Long: Become independent using PLB             Initial Exercise Prescription:  Initial Exercise Prescription - 01/10/21 1500       Date of Initial Exercise RX and Referring Provider   Date 01/10/21    Referring Provider Ottie Glazier MD      Oxygen   Maintain Oxygen Saturation 88% or higher      Recumbant Bike   Level 1    RPM 60    Minutes 15    METs 1      NuStep   Level 1    SPM 80    Minutes 15    METs 1      REL-XR   Level 1    Speed 50    Minutes 15    METs 1      Track   Laps 2   as tolerated   Minutes 15    METs 1      Prescription Details   Frequency (times per week) 2    Duration Progress  to 30 minutes of continuous aerobic without signs/symptoms of physical distress      Intensity   THRR 40-80% of Max Heartrate 101-131    Ratings of Perceived Exertion 11-13    Perceived Dyspnea 0-4      Progression   Progression Continue to progress workloads to maintain intensity without signs/symptoms of physical distress.      Resistance Training   Training Prescription Yes    Weight 3 lb    Reps 10-15             Perform Capillary Blood Glucose checks as needed.  Exercise Prescription Changes:   Exercise Prescription Changes     Row Name 01/10/21 1500 01/31/21 1500 02/14/21 1400 02/27/21 1000       Response to Exercise   Blood Pressure (Admit) 124/78 130/72 138/76 122/76    Blood Pressure (Exercise) 152/76 -- 144/76 134/66     Blood Pressure (Exit) 132/78 134/74 126/72 120/78    Heart Rate (Admit) 72 bpm 95 bpm 103 bpm 97 bpm    Heart Rate (Exercise) 106 bpm 99 bpm 106 bpm 103 bpm    Heart Rate (Exit) 75 bpm 92 bpm 93 bpm 100 bpm    Oxygen Saturation (Admit) 95 % 90 % 91 % 93 %    Oxygen Saturation (Exercise) 85 % 92 % 85 % 90 %    Oxygen Saturation (Exit) 94 % 93 % 94 % 93 %    Rating of Perceived Exertion (Exercise) _0 Perceived Dyspnea (Exercise) 1 1 0 0    Symptoms Legs fatigued none -- none    Comments walk test results -- -- --    Duration -- Progress to 30 minutes of  aerobic without signs/symptoms of physical distress Continue with 30 min of aerobic exercise without signs/symptoms of physical distress. Continue with 30 min of aerobic exercise without signs/symptoms of physical distress.    Intensity -- THRR unchanged THRR unchanged THRR unchanged      Progression   Progression -- Continue to progress workloads to maintain intensity without signs/symptoms of physical distress. Continue to progress workloads to maintain intensity without signs/symptoms of physical distress. Continue to progress workloads to maintain intensity without signs/symptoms of physical distress.    Average METs -- 1.25 1.35 1.75      Resistance Training   Training Prescription -- Yes Yes Yes    Weight -- 3 lb 3 lb 3 lb    Reps -- 10-15 10-15 10-15      Interval Training   Interval Training -- No No No      NuStep   Level -- 1 2 --    Minutes -- 15 30 --    METs -- 1.5 1.4 --      T5 Nustep   Level -- -- -- 1    Minutes -- -- -- 15    METs -- -- -- 1.8      Biostep-RELP   Level -- 1 -- 1    Minutes -- 15 -- 15    METs -- 1 -- 1      Oxygen   Maintain Oxygen Saturation -- -- 88% or higher 88% or higher             Exercise Comments:   Exercise Comments     Row Name 01/23/21 1354           Exercise Comments First full day of exercise!  Patient was oriented to  gym and equipment including  functions, settings, policies, and procedures.  Patient's individual exercise prescription and treatment plan were reviewed.  All starting workloads were established based on the results of the 6 minute walk test done at initial orientation visit.  The plan for exercise progression was also introduced and progression will be customized based on patient's performance and goals.                Exercise Goals and Review:   Exercise Goals     Row Name 01/10/21 1515             Exercise Goals   Increase Physical Activity Yes       Intervention Provide advice, education, support and counseling about physical activity/exercise needs.;Develop an individualized exercise prescription for aerobic and resistive training based on initial evaluation findings, risk stratification, comorbidities and participant's personal goals.       Expected Outcomes Short Term: Attend rehab on a regular basis to increase amount of physical activity.;Long Term: Add in home exercise to make exercise part of routine and to increase amount of physical activity.;Long Term: Exercising regularly at least 3-5 days a week.       Increase Strength and Stamina Yes       Intervention Provide advice, education, support and counseling about physical activity/exercise needs.;Develop an individualized exercise prescription for aerobic and resistive training based on initial evaluation findings, risk stratification, comorbidities and participant's personal goals.       Expected Outcomes Short Term: Increase workloads from initial exercise prescription for resistance, speed, and METs.;Short Term: Perform resistance training exercises routinely during rehab and add in resistance training at home;Long Term: Improve cardiorespiratory fitness, muscular endurance and strength as measured by increased METs and functional capacity (6MWT)       Able to understand and use rate of perceived exertion (RPE) scale Yes       Intervention Provide  education and explanation on how to use RPE scale       Expected Outcomes Short Term: Able to use RPE daily in rehab to express subjective intensity level;Long Term:  Able to use RPE to guide intensity level when exercising independently       Able to understand and use Dyspnea scale Yes       Intervention Provide education and explanation on how to use Dyspnea scale       Expected Outcomes Short Term: Able to use Dyspnea scale daily in rehab to express subjective sense of shortness of breath during exertion;Long Term: Able to use Dyspnea scale to guide intensity level when exercising independently       Knowledge and understanding of Target Heart Rate Range (THRR) Yes       Intervention Provide education and explanation of THRR including how the numbers were predicted and where they are located for reference       Expected Outcomes Short Term: Able to state/look up THRR;Long Term: Able to use THRR to govern intensity when exercising independently;Short Term: Able to use daily as guideline for intensity in rehab       Able to check pulse independently Yes       Intervention Provide education and demonstration on how to check pulse in carotid and radial arteries.;Review the importance of being able to check your own pulse for safety during independent exercise       Expected Outcomes Short Term: Able to explain why pulse checking is important during independent exercise;Long Term: Able to check pulse independently and accurately  Understanding of Exercise Prescription Yes       Intervention Provide education, explanation, and written materials on patient's individual exercise prescription       Expected Outcomes Short Term: Able to explain program exercise prescription;Long Term: Able to explain home exercise prescription to exercise independently                Exercise Goals Re-Evaluation :  Exercise Goals Re-Evaluation     Row Name 01/23/21 1354 01/31/21 1526 02/01/21 1339 02/14/21  1406 02/20/21 1347     Exercise Goal Re-Evaluation   Exercise Goals Review Able to understand and use rate of perceived exertion (RPE) scale;Increase Physical Activity;Knowledge and understanding of Target Heart Rate Range (THRR);Understanding of Exercise Prescription;Increase Strength and Stamina;Able to understand and use Dyspnea scale;Able to check pulse independently Increase Physical Activity;Increase Strength and Stamina Increase Physical Activity;Increase Strength and Stamina Increase Physical Activity;Increase Strength and Stamina;Understanding of Exercise Prescription Increase Physical Activity;Increase Strength and Stamina;Understanding of Exercise Prescription   Comments Reviewed RPE and dyspnea scales, THR and program prescription with pt today.  Pt voiced understanding and was given a copy of goals to take home. Hannah Powell is just getting started with her exercise program.  She almost reaches her THR range.  Staff will monitor progress. EP will hold off on doing home exercise with patient until patient is further into the program. She is a member at [facility] [employer] [facility] and she plans to go there once she is more in habit of rehab with exercise. I encouraged her to keep moving and start home exercise as soon as she feels ready. We talked about starting with 1 day at home. Hannah Powell is doing well in rehab.  She is getting in her full 30 min on the NuStep at level 2!  We will conitnue to montior her progress. --   Expected Outcomes Short: Use RPE daily to regulate intensity. Long: Follow program prescription in THR. Short: attend consistently Long:  improve overall stamina Short: Go over home exercise Long: Exercise independently at home at appropriate prescription short: Improve walking more Long; Conitnue to improve stamina --    Row Name 02/20/21 1348 02/27/21 1039           Exercise Goal Re-Evaluation   Exercise Goals Review Increase Physical Activity;Increase Strength and Stamina;Understanding of  Exercise Prescription Increase Physical Activity;Increase Strength and Stamina;Understanding of Exercise Prescription      Comments Hannah Powell did not get home exercise done yet due to exercise level. Per last EP note: "She is a member at [facility] [employer] [facility] and she plans to go there once she is more in habit of rehab with exercise. I encouraged her to keep moving and start home exercise as soon as she feels ready. We talked about starting with 1 day at home." She has been more active at home and deep cleaned her house recently and she felt like she was very worn out afterwards, but reports not knowing how to pace herself. Encourged her to keep active, but not to push herself. We talked to Hannah Powell about doing PT prior to finishing rehab so that she would be able to walk more in class and move around easier.  She seemed okay with it on Wednesday when we talked with her, but she called out on Thursday saying that it really upset her.  We discussed situation with her physcian and Dr. Lanney Gins agreed that PT may be a better fit to start.  We will again discuss with Hannah Powell today when  she comes.  She is still on level 1 on all equipment and stays put for her 30 min of exercise.  She has a difficult time moving to from chair to scale and chair to equipment.      Expected Outcomes short: continue to stay active at least 1 day per week outside of rehab, EP to go over home exercise when pt is ready Long; Conitnue to improve stamina Short: Follow up with patient on PT referral Long: Build up strength for rehab.               Discharge Exercise Prescription (Final Exercise Prescription Changes):  Exercise Prescription Changes - 02/27/21 1000       Response to Exercise   Blood Pressure (Admit) 122/76    Blood Pressure (Exercise) 134/66    Blood Pressure (Exit) 120/78    Heart Rate (Admit) 97 bpm    Heart Rate (Exercise) 103 bpm    Heart Rate (Exit) 100 bpm    Oxygen Saturation (Admit) 93 %    Oxygen Saturation  (Exercise) 90 %    Oxygen Saturation (Exit) 93 %    Rating of Perceived Exertion (Exercise) 11    Perceived Dyspnea (Exercise) 0    Symptoms none    Duration Continue with 30 min of aerobic exercise without signs/symptoms of physical distress.    Intensity THRR unchanged      Progression   Progression Continue to progress workloads to maintain intensity without signs/symptoms of physical distress.    Average METs 1.75      Resistance Training   Training Prescription Yes    Weight 3 lb    Reps 10-15      Interval Training   Interval Training No      T5 Nustep   Level 1    Minutes 15    METs 1.8      Biostep-RELP   Level 1    Minutes 15    METs 1      Oxygen   Maintain Oxygen Saturation 88% or higher             Nutrition:  Target Goals: Understanding of nutrition guidelines, daily intake of sodium <1594m, cholesterol <2024m calories 30% from fat and 7% or less from saturated fats, daily to have 5 or more servings of fruits and vegetables.  Education: All About Nutrition: -Group instruction provided by verbal, written material, interactive activities, discussions, models, and posters to present general guidelines for heart healthy nutrition including fat, fiber, MyPlate, the role of sodium in heart healthy nutrition, utilization of the nutrition label, and utilization of this knowledge for meal planning. Follow up email sent as well. Written material given at graduation. [facility]  Date 02/01/21  [facility] Code 1- Verbalizes Understanding       Biometrics:    Nutrition Therapy Plan and Nutrition Goals:  Nutrition Therapy & Goals - 01/10/21 1516       Intervention Plan   Intervention Prescribe, educate and counsel regarding individualized specific dietary modifications aiming towards targeted core components such as weight, hypertension, lipid management, diabetes,  heart failure and other comorbidities.    Expected Outcomes Short Term Goal: Understand basic principles of dietary content, such as calories, fat, sodium, cholesterol and nutrients.;Short Term Goal: A plan has been developed with personal nutrition goals set during dietitian appointment.;Long Term Goal: Adherence to prescribed nutrition plan.  Nutrition Assessments:  MEDIFICTS Score Key: ?70 Need to make dietary changes  40-70 Heart Healthy Diet ? 40 Therapeutic Level Cholesterol Diet  Flowsheet Row Pulmonary Rehab from 01/10/2021 in [facility] and Pulmonary Rehab  Picture Your Plate Total Score on Admission 76      Picture Your Plate Scores: <62 Unhealthy dietary pattern with much room for improvement. 41-50 Dietary pattern unlikely to meet recommendations for good health and room for improvement. 51-60 More healthful dietary pattern, with some room for improvement.  >60 Healthy dietary pattern, although there may be some specific behaviors that could be improved.   Nutrition Goals Re-Evaluation:  Nutrition Goals Re-Evaluation     [place] Name 02/01/21 1519 02/20/21 1343           Goals   Comment Hannah Powell has not met with the RD yet. She has an appointment coming up. Nutrition appointment rescheduled for 12/12.      Expected Outcome Short: Meet with RD Long: Follow goals established by RD Short: Meet with RD Long: Follow goals established by RD               Nutrition Goals Discharge (Final Nutrition Goals Re-Evaluation):  Nutrition Goals Re-Evaluation - 02/20/21 1343       Goals   Comment Nutrition appointment rescheduled for 12/12.    Expected Outcome Short: Meet with RD Long: Follow goals established by RD             Psychosocial: Target Goals: Acknowledge presence or absence of significant depression and/or stress, maximize coping skills, provide positive support system. Participant is able to verbalize types and ability to use techniques and  skills needed for reducing stress and depression.   Education: Stress, Anxiety, and Depression - Group verbal and visual presentation to define topics covered.  Reviews how body is impacted by stress, anxiety, and depression.  Also discusses healthy ways to reduce stress and to treat/manage anxiety and depression.  Written material given at graduation.   Education: Sleep Hygiene -Provides group verbal and written instruction about how sleep can affect your health.  Define sleep hygiene, discuss sleep cycles and impact of sleep habits. Review good sleep hygiene tips.    Initial Review & Psychosocial Screening:  Initial Psych Review & Screening - 12/05/20 1119       Initial Review   Current issues with Current Sleep Concerns;Current Stress Concerns    Source of Stress Concerns Chronic Illness;Unable to participate in former interests or hobbies    Comments Landra states that she is not depressed but is having a hard time coping with her legs being weak. She can look to her husband, two children and grand children that she can depend on.      Family Dynamics   Good Support System? Yes      Barriers   Psychosocial barriers to participate in program The patient should benefit from training in stress management and relaxation.      Screening Interventions   Interventions To provide support and resources with identified psychosocial needs;Provide feedback about the scores to participant;Encouraged to exercise    Expected Outcomes Short Term goal: Utilizing psychosocial counselor, staff and physician to assist with identification of specific Stressors or current issues interfering with healing process. Setting desired goal for each stressor or current issue identified.;Long Term Goal: Stressors or current issues are controlled or eliminated.;Short Term goal: Identification and review with participant of any Quality of Life or Depression concerns found by scoring the questionnaire.;Long Term goal:  The participant improves quality of Life and PHQ9 Scores as seen by post scores and/or verbalization of changes             Quality of Life Scores:  Scores of 19 and below usually indicate a poorer quality of life in these areas.  A difference of  2-3 points is a clinically meaningful difference.  A difference of 2-3 points in the total score of the Quality of Life Index has been associated with significant improvement in overall quality of life, self-image, physical symptoms, and general health in studies assessing change in quality of life.  PHQ-9: Recent Review Flowsheet Data     Depression screen [facility] 2/9 01/10/2021 10/01/2016 08/09/2016   Decreased Interest 0 0 0   Down, Depressed, Hopeless 0 0 0   PHQ - 2 Score 0 0 0   Altered sleeping 1 - -   Tired, decreased energy 1 - -   Change in appetite 0 - -   Feeling bad or failure about yourself  1 - -   Trouble concentrating 0 - -   Moving slowly or fidgety/restless 0 - -   Suicidal thoughts 0 - -   PHQ-9 Score 3 - -      Interpretation of Total Score  Total Score Depression Severity:  1-4 = Minimal depression, 5-9 = Mild depression, 10-14 = Moderate depression, 15-19 = Moderately severe depression, 20-27 = Severe depression   Psychosocial Evaluation and Intervention:  Psychosocial Evaluation - 12/05/20 1122       Psychosocial Evaluation & Interventions   Interventions Encouraged to exercise with the program and follow exercise prescription;Relaxation education;Stress management education    Comments Hodan states that she is not depressed but is having a hard time coping with her legs being weak. She can look to her husband, two children and grand children that she can depend on.    Expected Outcomes Short: Start [facility] to help with mood. Long: Maintain a healthy mental state.    Continue Psychosocial Services  Follow up required by staff             Psychosocial Re-Evaluation:  Psychosocial Re-Evaluation     [place]  Name 02/01/21 5916 02/20/21 1352           Psychosocial Re-Evaluation   Current issues with Current Stress Concerns Current Stress Concerns      Comments Hannah Powell is holding up well mentally. Her leg weakness and arthritis are her biggest problems that limits her from moving and is frustrating for her. She is not traveling to her family in [place] due to her medical problems and is a little upset by it.  She is enjoying [facility] thus far and is thinking it'll help her tremendously. She takes Alprazolam is as needed and has not needed to take it in while. She reports no major stressors, but it causes her stress to not be able to do the things she used to do due to her legs - it is hard for her to get around. She gets frustrated with how long it takes her to do things. She enjoys to read ([place] [employer] [address]), she is also doing a bible study. She relies on her husband and children as support for her. She reports sleeping well and very rarely has a sleepless night.      Expected Outcomes Short: Continue attendance with rehab Long: Continue to maintain posititive attitude and utilize exercise for stress management Short: Continue attendance with rehab Long: Continue to  maintain posititive attitude and utilize exercise for stress management      Interventions Encouraged to attend Pulmonary Rehabilitation for the exercise Encouraged to attend Pulmonary Rehabilitation for the exercise      Continue Psychosocial Services  Follow up required by staff Follow up required by staff        Initial Review   Source of Stress Concerns -- Chronic Illness;Unable to participate in former interests or hobbies               Psychosocial Discharge (Final Psychosocial Re-Evaluation):  Psychosocial Re-Evaluation - 02/20/21 1352       Psychosocial Re-Evaluation   Current issues with Current Stress Concerns    Comments She reports no major stressors, but it causes her stress to not be able to do the things she used  to do due to her legs - it is hard for her to get around. She gets frustrated with how long it takes her to do things. She enjoys to read ([place] [employer] [address]), she is also doing a bible study. She relies on her husband and children as support for her. She reports sleeping well and very rarely has a sleepless night.    Expected Outcomes Short: Continue attendance with rehab Long: Continue to maintain posititive attitude and utilize exercise for stress management    Interventions Encouraged to attend Pulmonary Rehabilitation for the exercise    Continue Psychosocial Services  Follow up required by staff      Initial Review   Source of Stress Concerns Chronic Illness;Unable to participate in former interests or hobbies             Education: Education Goals: Education classes will be provided on a weekly basis, covering required topics. Participant will state understanding/return demonstration of topics presented.  Learning Barriers/Preferences:  Learning Barriers/Preferences - 12/05/20 1118       Learning Barriers/Preferences   Learning Barriers None    Learning Preferences None             General Pulmonary Education Topics:  Infection Prevention: - Provides verbal and written material to individual with discussion of infection control including proper hand washing and proper equipment cleaning during exercise session. Flowsheet Row Pulmonary Rehab from 12/05/2020 in [facility] Cardiac and Pulmonary Rehab  Date 12/05/20  Educator [facility]  Instruction Review Code 1- Verbalizes Understanding       Falls Prevention: - Provides verbal and written material to individual with discussion of falls prevention and safety. Flowsheet Row Pulmonary Rehab [facility] 12[facility]05/2020 [facility] Card[facility] and Pulmonary Rehab  Date 12/05/20  Educator [facility]  Instruction Review Code 1- Verbalizes Understanding       Chronic Lung Disease Review: - Group verbal instruction with posters, models, PowerPoint  presentations and videos,  to review new updates, new respiratory medications, new advancements in procedures and treatments. Providing information on websites and "800" numbers for continued self-education. Includes information about supplement oxygen, available portable oxygen systems, continuous and intermittent flow rates, oxygen safety, concentrators, and Medicare reimbursement for oxygen. Explanation of Pulmonary Drugs, including class, frequency, complications, importance of spacers, rinsing mouth after steroid MDI's, and proper cleaning methods for nebulizers. Review of basic lung anatomy and physiology related to function, structure, and complications of lung disease. Review of risk factors. Discussion about methods for diagnosing sleep apnea and types of masks and machines for OSA. Includes a review of the use of types of environmental controls: home humidity, furnaces, filters, dust mite/pet prevention, HEPA vacuums. Discussion about weather  changes, air quality and the benefits of nasal washing. Instruction on Warning signs, infection symptoms, calling MD promptly, preventive modes, and value of vaccinations. Review of effective airway clearance, coughing and/or vibration techniques. Emphasizing that all should Create an Action Plan. Written material given at graduation. Flowsheet Row Pulmonary Rehab from 02/22/2021 in [facility] Cardiac and Pulmonary Rehab  Education need identified 01/10/21  Date 02/22/21  Educator [facility]  Instruction Review Code 1- Verbalizes Understanding       AED/CPR: - Group verbal and written instruction with the use of models to demonstrate the basic use of the AED with the basic ABC's of resuscitation.    Anatomy and Cardiac Procedures: - Group verbal and visual presentation and models provide information about basic cardiac anatomy and function. Reviews the testing methods done to diagnose heart disease and the outcomes of the test results. Describes the treatment  choices: Medical Management, Angioplasty, or Coronary Bypass Surgery for treating various heart conditions including Myocardial Infarction, Angina, Valve Disease, and Cardiac Arrhythmias.  Written material given at graduation.   Medication Safety: - Group verbal and visual instruction to review commonly prescribed medications for heart and lung disease. Reviews the medication, class of the drug, and side effects. Includes the steps to properly store meds and maintain the prescription regimen.  Written material given at graduation.   Other: -Provides group and verbal instruction on various topics (see comments)   Knowledge Questionnaire Score:  Knowledge Questionnaire Score - 01/10/21 1122       Knowledge Questionnaire Score   Pre Score 16/18: Exercise, O2              Core Components/Risk Factors/Patient Goals at Admission:  Personal Goals and Risk Factors at Admission - 01/10/21 1516       Core Components/Risk Factors/Patient Goals on Admission    Weight Management Yes;Weight Loss    Intervention Weight Management: Develop a combined nutrition and exercise program designed to reach desired caloric intake, while maintaining appropriate intake of nutrient and fiber, sodium and fats, and appropriate energy expenditure required for the weight goal.;Weight Management: Provide education and appropriate resources to help participant work on and attain dietary goals.;Weight Management/Obesity: Establish reasonable short term and long term weight goals.    Admit Weight 232 lb (105.2 kg)    Goal Weight: Short Term 227 lb (103 kg)    Goal Weight: Long Term 200 lb (90.7 kg)    Expected Outcomes Short Term: Continue to assess and modify interventions until short term weight is achieved;Long Term: Adherence to nutrition and physical activity/exercise program aimed toward attainment of established weight goal;Weight Loss: Understanding of general recommendations for a balanced deficit meal plan,  which promotes 1-2 lb weight loss per week and includes a negative energy balance of [id number] kcal/d;Understanding recommendations for meals to include 15-35% energy as protein, 25-35% energy from fat, 35-60% energy from carbohydrates, less than 235m of dietary cholesterol, 20-35 gm of total fiber daily;Understanding of distribution of calorie intake throughout the day with the consumption of 4-5 meals/snacks    Improve shortness of breath with ADL's Yes    Intervention Provide education, individualized exercise plan and daily activity instruction to help decrease symptoms of SOB with activities of daily living.    Expected Outcomes Short Term: Improve cardiorespiratory fitness to achieve a reduction of symptoms when performing ADLs;Long Term: Be able to perform more ADLs without symptoms or delay the onset of symptoms    Heart Failure Yes    Intervention Provide a combined exercise  and nutrition program that is supplemented with education, support and counseling about heart failure. Directed toward relieving symptoms such as shortness of breath, decreased exercise tolerance, and extremity edema.    Expected Outcomes Improve functional capacity of life;Short term: Attendance in program 2-3 days a week with increased exercise capacity. Reported lower sodium intake. Reported increased fruit and vegetable intake. Reports medication compliance.;Short term: Daily weights obtained and reported for increase. Utilizing diuretic protocols set by physician.;Long term: Adoption of self-care skills and reduction of barriers for early signs and symptoms recognition and intervention leading to self-care maintenance.    Hypertension Yes    Intervention Provide education on lifestyle modifcations including regular physical activity/exercise, weight management, moderate sodium restriction and increased consumption of fresh fruit, vegetables, and low fat dairy, alcohol moderation, and smoking cessation.;Monitor prescription  use compliance.    Expected Outcomes Short Term: Continued assessment and intervention until BP is < 140/71m HG in hypertensive participants. < 130/850mHG in hypertensive participants with diabetes, heart failure or chronic kidney disease.;Long Term: Maintenance of blood pressure at goal levels.             Education:Diabetes - Individual verbal and written instruction to review signs/symptoms of diabetes, desired ranges of glucose level fasting, after meals and with exercise. Acknowledge that pre and post exercise glucose checks will be done for 3 sessions at entry of program.   Know Your Numbers and Heart Failure: - Group verbal and visual instruction to discuss disease risk factors for cardiac and pulmonary disease and treatment options.  Reviews associated critical values for Overweight/Obesity, Hypertension, Cholesterol, and Diabetes.  Discusses basics of heart failure: signs/symptoms and treatments.  Introduces Heart Failure Zone chart for action plan for heart failure.  Written material given at graduation. [facility]  Date 02/15/21  [facility] Review Code 1- Verbalizes Understanding       Core Components/Risk Factors/Patient Goals Review:   Goals and Risk Factor Review     Row Name 02/01/21 1345 02/20/21 1344           Core Components/Risk Factors/Patient Goals Review   Personal Goals Review Hypertension;Weight Management/Obesity;Heart Failure Hypertension;Weight Management/Obesity;Heart Failure      Review ToVivien Rotas focusing more on portion control, drinking more water, and reducing her snacks to help her lose weight which is still a goal of hers. ToVivien Rotas weighing herself at home daily and is aware to note if she gains a lot of weight in a short period of time. Denies any heart failure symptoms at this time. She checks her BP at home and is running [id number] and 7062-26Jiastolic. Her  BPs at rehab are good as well. ToVivien Rotaas not met with RD yet, new appointment scheduled for 12/12. ToVivien Rotas still weighing herself daily reporting no major changes - reminded to take note of major weight changes in short period of time. She continues to check her BP at home when she remembers, every couple of weeks. Her BP today was 138/78.      Expected Outcomes Short: Continue working towards weight loss Long: Manage life style risk factors ST: check BP regularly at home (when not at rehab) LT: manage lifestyle risk factors               Core Components/Risk Factors/Patient Goals at Discharge (Final Review):   Goals and Risk Factor Review - 02/20/21 1344       Core Components/Risk Factors/Patient  Goals Review   Personal Goals Review Hypertension;Weight Management/Obesity;Heart Failure    Review Hannah Powell has not met with RD yet, new appointment scheduled for 12/12. Hannah Powell is still weighing herself daily reporting no major changes - reminded to take note of major weight changes in short period of time. She continues to check her BP at home when she remembers, every couple of weeks. Her BP today was 138/78.    Expected Outcomes ST: check BP regularly at home (when not at rehab) LT: manage lifestyle risk factors             ITP Comments:  ITP Comments     Row Name 12/05/20 1115 12/05/20 1125 01/10/21 1114 01/18/21 0648 01/23/21 1354   ITP Comments Virtual Visit completed. Patient informed on EP and RD appointment and 6 Minute walk test. Patient also informed of patient health questionnaires on My Chart. Patient Verbalizes understanding. Visit diagnosis can be found in [facility] 11/18/2020. Patient is to call back when she gets out of her boot. She had broke her ankle from a fall and will call back in two weeks for clearance to exercise. Completed 6MWT and gym orientation. Initial ITP created and sent for review to Dr. Ottie Glazier, Medical Director. 30 Day review completed. Medical Director ITP review  done, changes made as directed, and signed approval by Medical Director.   New to program First full day of exercise!  Patient was oriented to gym and equipment including functions, settings, policies, and procedures.  Patient's individual exercise prescription and treatment plan were reviewed.  All starting workloads were established based on the results of the 6 minute walk test done at initial orientation visit.  The plan for exercise progression was also introduced and progression will be customized based on patient's performance and goals.    [place] Name 02/15/21 0641 03/06/21 1421 03/15/21 0701       ITP Comments 30 Day review completed. Medical Director ITP review done, changes made as directed, and signed approval by Medical Director.    New to program Hannah Powell is now on medical hold for PT to improve strength and safety before finishing her rehab. 30 Day review completed. Medical Director ITP review done, changes made as directed, and signed approval by Medical Director.              Comments:

## 2021-03-16 ENCOUNTER — Other Ambulatory Visit: Payer: Self-pay

## 2021-03-16 ENCOUNTER — Ambulatory Visit: Payer: Medicare HMO | Admitting: Physical Therapy

## 2021-03-16 DIAGNOSIS — M6281 Muscle weakness (generalized): Secondary | ICD-10-CM | POA: Diagnosis not present

## 2021-03-16 DIAGNOSIS — R269 Unspecified abnormalities of gait and mobility: Secondary | ICD-10-CM

## 2021-03-16 DIAGNOSIS — R293 Abnormal posture: Secondary | ICD-10-CM | POA: Diagnosis not present

## 2021-03-16 DIAGNOSIS — R2689 Other abnormalities of gait and mobility: Secondary | ICD-10-CM

## 2021-03-18 NOTE — Therapy (Signed)
[facility] [address] [place], [name], [address] Phone: [phone]   Fax:  [phone]  Physical Therapy Treatment  Patient Details  Name: Hannah Powell MRN: [id number] Date of Birth: 08-Jan-1946 Referring Provider (PT): Dr. Ottie Glazier   Encounter Date: 03/16/2021   PT End of Session - 03/18/21 1336     Visit Number 5    Number of Visits 16    Date for PT Re-Evaluation 04/26/21    Authorization - Visit Number 5    Authorization - Number of Visits 10    PT Start Time 2426    PT Stop Time 1633    PT Time Calculation (min) 59 min    Equipment Utilized During Treatment Gait belt    Activity Tolerance Patient tolerated treatment well;Patient limited by fatigue    Behavior During Therapy WFL for tasks assessed/performed             Past Medical History:  Diagnosis Date   Anxiety    Arthritis    CHF (congestive heart failure) (HCC)    COPD (chronic obstructive pulmonary disease) ([place])    Dysplastic nevus 04/22/2019   R abdomen parallel to sup umbilicus - mod   Dysplastic nevus 04/22/2019   R low back above waistline - mild    Edema    feet/legs   Hypertension    dr Otho Najjar      Hypothyroidism    Leg weakness, bilateral    Osteoporosis    Oxygen deficit    2l with bipap at night   RLS (restless legs syndrome)    Shortness of breath    Sleep apnea    bipap   Wheezing     Past Surgical History:  Procedure Laterality Date   BACK SURGERY     cervical   BREAST BIOPSY Left    needle bx-neg   CATARACT EXTRACTION W/PHACO Left 05/07/2017   Procedure: CATARACT EXTRACTION PHACO AND INTRAOCULAR LENS PLACEMENT ([place]);  Surgeon: Birder Robson, MD;  Location: [facility];  Service: Ophthalmology;  Laterality: Left;  [place] 00:48.7 AP% 11.3 CDE 5.46 Fluid Pack Lot # [id number]   CATARACT EXTRACTION W/PHACO Right 04/23/2017   Procedure: CATARACT EXTRACTION PHACO AND INTRAOCULAR LENS PLACEMENT ([facility]);  Surgeon:  Birder Robson, MD;  Location: [facility];  Service: Ophthalmology;  Laterality: Right;  [place] 00:29 AP% 12.8 CDE 3.79 FLUID PACK LOT # [id number] H   FOOT ARTHROPLASTY     JOINT REPLACEMENT     x2 tkr   TOTAL SHOULDER ARTHROPLASTY Left 07/02/2013   Procedure: LEFT TOTAL SHOULDER ARTHROPLASTY;  Surgeon: Marin Shutter, MD;  Location: [place];  Service: Orthopedics;  Laterality: Left;   TOTAL SHOULDER ARTHROPLASTY Right 04/10/2018   Procedure: TOTAL REVERSE SHOULDER ARTHROPLASTY;  Surgeon: Justice Britain, MD;  Location: [facility];  Service: Orthopedics;  Laterality: Right;  144min    There were no vitals filed for this visit.   Subjective Assessment - 03/18/21 1335     Subjective Pt. arrived to PT early with no new complaints.    Pertinent History extensive PMH.  Pt. was participating with Pulmonary Rehab and pt. states that she is hoping to return to Pulmonary Rehab when appropriate.  Pt. states they were concerned about her LE strength/ balance getting on/off equipment.    Limitations Standing;Walking;House hold activities    Patient Stated Goals Increase B LE muscle strength/ return to Pulmonary Rehab    Currently in Pain? No/denies  Ther.ex.:   Nustep L3-4 10 min. Seat 10 for 10 min. With B UE/LE.  Walking in hallway with use of RW and moderate cuing to correct posture.   Forward/backwards/lateral walking in //-bars 2x each direction with cuing to maintain upright posture/ head position.  CGA with gait belt for safety.  .     Standing knee flexion/ heel raises 20x.       Walking out to car with use of RW and moderate cuing to correct posture/ step pattern.  Pt. Challenged with ramp outside/ curb step downs.  Extra time and standing rest break required.    Neuro:  Sit to stands: gray chair with single and double Airex padding.  Pt. Requires cuing/ posture correction to safely stand.     STS with added step/lunges in //-bars with quad focus  3" step ups/ overs in  //-bars     PT Long Term Goals - 03/03/21 1240       PT LONG TERM GOAL #1   Title Pt. will improve 5XSTS to <15 sec to improve functional mobility.    Baseline TBD    Time 8    Period Weeks    Status New    Target Date 04/26/21      PT LONG TERM GOAL #2   Title Pt. will increase B LE muscle strength 1/2 muscle grade to improve safety/ independence with functional mobility.    Baseline neurogenic claudication. Pt. presents with good strength in bilat. hip add and abd, bilat DF, and quadriceps. Pt. has 4/5 strength in B hip flex, 4+/5 in R knee flex, and R inversion. Pt. has 4/5 strength with L knee flex, R eversion, and L inverison.    Time 8    Period Weeks    Status New    Target Date 04/26/21      PT LONG TERM GOAL #3   Title Pt. will demonstrate improved normal gait speed to 0.4 m/s to decrease falls risk and improve ability to safely walk in community.    Baseline TBD    Time 8    Period Weeks    Status New    Target Date 04/26/21      PT LONG TERM GOAL #4   Title Pt. will demonstrate safety with ascending 4 stairs with at most one handrail to improve community ambulation.    Baseline Pt able to ascend/ descend stairs with both handrails and lateral step to pattern.    Time 8    Period Weeks    Status New    Target Date 04/26/21                   Plan - 03/18/21 1339     Clinical Impression Statement Pt. requires 4-6" padded lift in chair to allow pt. to stand without use of armrests safely.  Moderate LE muscle fatigue as tx. progresses and extra time/ stand rest breaks to ambulate from PT gym outside to car.  Pts. step pattern decreases while walking over ramp/ sideway/ thresholds.  No episodes of knee buckling but pt. requires UE assist with all standing/ dynamic tasks.    Personal Factors and Comorbidities Age;Comorbidity 3+    Comorbidities CPD, HTN, Obesity    Stability/Clinical Decision Making Evolving/Moderate complexity    Clinical Decision Making  Moderate    Rehab Potential Fair    PT Frequency 2x / week    PT Duration 8 weeks    PT Treatment/Interventions Cryotherapy;Electrical Stimulation;Ultrasound;Moist Heat;Gait training;Functional  mobility training;Therapeutic activities;Therapeutic exercise;Neuromuscular re-education;Scar mobilization;Balance training;Stair training;Patient/family education;Manual techniques;ADLs/Self Care Home Management    PT Next Visit Plan Dynamic balance tasks/ outside walking on ramp/ curb    Consulted and Agree with Plan of Care Patient             Patient will benefit from skilled therapeutic intervention in order to improve the following deficits and impairments:  Decreased endurance, Decreased range of motion, Decreased skin integrity, Decreased strength, Hypomobility, Impaired UE functional use, Increased fascial restricitons, Pain, Postural dysfunction, Impaired flexibility, Decreased scar mobility, Decreased mobility, Decreased balance, Improper body mechanics, Abnormal gait, Decreased activity tolerance, Impaired sensation  Visit Diagnosis: Muscle weakness (generalized)  Gait difficulty  Abnormal posture  Imbalance     Problem List Patient Active Problem List   Diagnosis Date Noted   S/P reverse total shoulder arthroplasty, 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   Chronic diastolic heart failure ([place]) 08/09/2016   COPD (chronic obstructive pulmonary disease) ([place]) 08/09/2016   Pulmonary infiltrates    Pneumonia due to Streptococcus pneumoniae (HCC)    Fever    Community acquired pneumonia of right lung    Palliative care by specialist    Goals of care, counseling/discussion    Pressure injury of skin 07/03/2016   COPD exacerbation ([place]) 07/21/2015   Acute renal insufficiency 07/21/2015   Hyperglycemia 07/21/2015   Benign essential HTN 07/21/2015   S/P shoulder replacement 07/02/2013   Pura Spice, PT, DPT # [phone] 03/18/2021, 1:43 PM  [place] [facility] [address] [place], [name], [address] Phone: [phone]   Fax:  [phone]  Name: Hannah Powell MRN: [id number] Date of Birth: 1945/11/22

## 2021-03-21 ENCOUNTER — Ambulatory Visit: Payer: Medicare HMO | Attending: Pulmonary Disease | Admitting: Physical Therapy

## 2021-03-21 ENCOUNTER — Other Ambulatory Visit: Payer: Self-pay

## 2021-03-21 ENCOUNTER — Encounter: Payer: Self-pay | Admitting: Physical Therapy

## 2021-03-21 DIAGNOSIS — R269 Unspecified abnormalities of gait and mobility: Secondary | ICD-10-CM | POA: Diagnosis not present

## 2021-03-21 DIAGNOSIS — E034 Atrophy of thyroid (acquired): Secondary | ICD-10-CM | POA: Diagnosis not present

## 2021-03-21 DIAGNOSIS — J439 Emphysema, unspecified: Secondary | ICD-10-CM | POA: Diagnosis not present

## 2021-03-21 DIAGNOSIS — G63 Polyneuropathy in diseases classified elsewhere: Secondary | ICD-10-CM | POA: Diagnosis not present

## 2021-03-21 DIAGNOSIS — R293 Abnormal posture: Secondary | ICD-10-CM | POA: Diagnosis not present

## 2021-03-21 DIAGNOSIS — N1831 Chronic kidney disease, stage 3a: Secondary | ICD-10-CM | POA: Diagnosis not present

## 2021-03-21 DIAGNOSIS — Z79899 Other long term (current) drug therapy: Secondary | ICD-10-CM | POA: Diagnosis not present

## 2021-03-21 DIAGNOSIS — M6281 Muscle weakness (generalized): Secondary | ICD-10-CM | POA: Diagnosis not present

## 2021-03-21 DIAGNOSIS — R2689 Other abnormalities of gait and mobility: Secondary | ICD-10-CM | POA: Diagnosis not present

## 2021-03-21 DIAGNOSIS — M48062 Spinal stenosis, lumbar region with neurogenic claudication: Secondary | ICD-10-CM | POA: Diagnosis not present

## 2021-03-21 DIAGNOSIS — I5032 Chronic diastolic (congestive) heart failure: Secondary | ICD-10-CM | POA: Diagnosis not present

## 2021-03-21 DIAGNOSIS — J9611 Chronic respiratory failure with hypoxia: Secondary | ICD-10-CM | POA: Diagnosis not present

## 2021-03-21 DIAGNOSIS — I130 Hypertensive heart and chronic kidney disease with heart failure and stage 1 through stage 4 chronic kidney disease, or unspecified chronic kidney disease: Secondary | ICD-10-CM | POA: Diagnosis not present

## 2021-03-21 NOTE — Therapy (Signed)
[facility] [address] [place], [name], [address] Phone: [phone]   Fax:  [phone]  Physical Therapy Treatment  Patient Details  Name: Hannah Powell MRN: [id number] Date of Birth: 1945/05/06 Referring Provider (PT): Dr. Ottie Glazier   Encounter Date: 03/21/2021   PT End of Session - 03/21/21 0906     Visit Number 6    Number of Visits 16    Date for PT Re-Evaluation 04/26/21    Authorization - Visit Number 6    Authorization - Number of Visits 10    PT Start Time 0902    PT Stop Time 0946    PT Time Calculation (min) 44 min    Equipment Utilized During Treatment Gait belt    Activity Tolerance Patient tolerated treatment well;Patient limited by fatigue    Behavior During Therapy WFL for tasks assessed/performed             Past Medical History:  Diagnosis Date   Anxiety    Arthritis    CHF (congestive heart failure) (HCC)    COPD (chronic obstructive pulmonary disease) ([place])    Dysplastic nevus 04/22/2019   R abdomen parallel to sup umbilicus - mod   Dysplastic nevus 04/22/2019   R low back above waistline - mild    Edema    feet/legs   Hypertension    dr Otho Najjar      Hypothyroidism    Leg weakness, bilateral    Osteoporosis    Oxygen deficit    2l with bipap at night   RLS (restless legs syndrome)    Shortness of breath    Sleep apnea    bipap   Wheezing     Past Surgical History:  Procedure Laterality Date   BACK SURGERY     cervical   BREAST BIOPSY Left    needle bx-neg   CATARACT EXTRACTION W/PHACO Left 05/07/2017   Procedure: CATARACT EXTRACTION PHACO AND INTRAOCULAR LENS PLACEMENT ([place]);  Surgeon: Birder Robson, MD;  Location: [facility];  Service: Ophthalmology;  Laterality: Left;  [place] 00:48.7 AP% 11.3 CDE 5.46 Fluid Pack Lot # [id number] H   CATARACT EXTRACTION W/PHACO Right 04/23/2017   Procedure: CATARACT EXTRACTION PHACO AND INTRAOCULAR LENS PLACEMENT ([facility]);  Surgeon:  Birder Robson, MD;  Location: [facility];  Service: Ophthalmology;  Laterality: Right;  [place] 00:29 AP% 12.8 CDE 3.79 FLUID PACK LOT # [id number] H   FOOT ARTHROPLASTY     JOINT REPLACEMENT     x2 tkr   TOTAL SHOULDER ARTHROPLASTY Left 07/02/2013   Procedure: LEFT TOTAL SHOULDER ARTHROPLASTY;  Surgeon: Marin Shutter, MD;  Location: [place];  Service: Orthopedics;  Laterality: Left;   TOTAL SHOULDER ARTHROPLASTY Right 04/10/2018   Procedure: TOTAL REVERSE SHOULDER ARTHROPLASTY;  Surgeon: Justice Britain, MD;  [facility];  Service: Orthopedics;  Laterality: Right;  145min    There were no vitals filed for this visit.   Subjective Assessment - 03/21/21 0905     Subjective Pt. reports minimal low back pain (1-2/10).  Pt. reports a good weekend.    Pertinent History extensive PMH.  Pt. was participating with Pulmonary Rehab and pt. states that she is hoping to return to Pulmonary Rehab when appropriate.  Pt. states they were concerned about her LE strength/ balance getting on/off equipment.    Limitations Standing;Walking;House hold activities    Patient Stated Goals Increase B LE muscle strength/ return to Pulmonary Rehab    Currently in Pain? Yes  Pain Score 2     Pain Location Back    Pain Orientation Right    Pain Type Chronic pain              Pt. Reports good compliance with HEP and went to gym to ride bike 40 min. And complete hip ex.   Ther.ex.:  STS from varying heights on blue mat table (added L/R partial lunges with RW assist).  Difficulty with L quad/ knee stability during R LE lunges.  Mat table height at 20.5 inches with standing without UE assist.     Forward/backwards/lateral walking in //-bars 2x each direction with cuing to maintain upright posture/ head position.  CGA for safety.   Nustep L3-4 10 min. Seat 10 for 10 min. With B UE/LE.    Walking out to car with use of RW and moderate cuing to correct posture/ step pattern.  Pt. Challenged with ramp outside/  curb step downs.  Extra time and standing rest break required.         PT Long Term Goals - 03/03/21 1240       PT LONG TERM GOAL #1   Title Pt. will improve 5XSTS to <15 sec to improve functional mobility.    Baseline TBD    Time 8    Period Weeks    Status New    Target Date 04/26/21      PT LONG TERM GOAL #2   Title Pt. will increase B LE muscle strength 1/2 muscle grade to improve safety/ independence with functional mobility.    Baseline neurogenic claudication. Pt. presents with good strength in bilat. hip add and abd, bilat DF, and quadriceps. Pt. has 4/5 strength in B hip flex, 4+/5 in R knee flex, and R inversion. Pt. has 4/5 strength with L knee flex, R eversion, and L inverison.    Time 8    Period Weeks    Status New    Target Date 04/26/21      PT LONG TERM GOAL #3   Title Pt. will demonstrate improved normal gait speed to 0.4 m/s to decrease falls risk and improve ability to safely walk in community.    Baseline TBD    Time 8    Period Weeks    Status New    Target Date 04/26/21      PT LONG TERM GOAL #4   Title Pt. will demonstrate safety with ascending 4 stairs with at most one handrail to improve community ambulation.    Baseline Pt able to ascend/ descend stairs with both handrails and lateral step to pattern.    Time 8    Period Weeks    Status New    Target Date 04/26/21                   Plan - 03/21/21 0906     Clinical Impression Statement Several seated rest breaks required between STS and ther.ex.  Pt. very motivated and working hard with increasing B hip/quad strengthening to improve funcitonal mobility/ safety with STS and walking.  No LOB during tx. session and cuing to increase posture/ step length/ heel strike.  No change to HEP.    Personal Factors and Comorbidities Age;Comorbidity 3+    Comorbidities CPD, HTN, Obesity    Stability/Clinical Decision Making Evolving/Moderate complexity    Clinical Decision Making Moderate     Rehab Potential Fair    PT Frequency 2x / week    PT Duration 8 weeks  PT Treatment/Interventions Cryotherapy;Electrical Stimulation;Ultrasound;Moist Heat;Gait training;Functional mobility training;Therapeutic activities;Therapeutic exercise;Neuromuscular re-education;Scar mobilization;Balance training;Stair training;Patient/family education;Manual techniques;ADLs/Self Care Home Management    PT Next Visit Plan Dynamic balance tasks/ outside walking on ramp/ curb    Consulted and Agree with Plan of Care Patient             Patient will benefit from skilled therapeutic intervention in order to improve the following deficits and impairments:  Decreased endurance, Decreased range of motion, Decreased skin integrity, Decreased strength, Hypomobility, Impaired UE functional use, Increased fascial restricitons, Pain, Postural dysfunction, Impaired flexibility, Decreased scar mobility, Decreased mobility, Decreased balance, Improper body mechanics, Abnormal gait, Decreased activity tolerance, Impaired sensation  Visit Diagnosis: Muscle weakness (generalized)  Gait difficulty  Abnormal posture  Imbalance     Problem List Patient Active Problem List   Diagnosis Date Noted   S/P reverse total shoulder arthroplasty, 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   Chronic diastolic heart failure ([place]) 08/09/2016   COPD (chronic obstructive pulmonary disease) ([place]) 08/09/2016   Pulmonary infiltrates    Pneumonia due to Streptococcus pneumoniae (HCC)    Fever    Community acquired pneumonia of right lung    Palliative care by specialist    Goals of care, counseling/discussion    Pressure injury of skin 07/03/2016   COPD exacerbation ([place]) 07/21/2015   Acute renal insufficiency 07/21/2015   Hyperglycemia 07/21/2015   Benign essential HTN 07/21/2015   S/P shoulder replacement 07/02/2013   Pura Spice, PT, DPT # [phone] 03/21/2021, 9:43 AM  [facility] [address] [place], [name], [address] Phone: [phone]   Fax:  [phone]  Name: Hannah Powell MRN: [id number] Date of Birth: 10/11/1945

## 2021-03-23 ENCOUNTER — Encounter: Payer: Self-pay | Admitting: Physical Therapy

## 2021-03-23 ENCOUNTER — Ambulatory Visit: Payer: Medicare HMO | Admitting: Physical Therapy

## 2021-03-23 ENCOUNTER — Other Ambulatory Visit: Payer: Self-pay

## 2021-03-23 DIAGNOSIS — M6281 Muscle weakness (generalized): Secondary | ICD-10-CM

## 2021-03-23 DIAGNOSIS — R269 Unspecified abnormalities of gait and mobility: Secondary | ICD-10-CM

## 2021-03-23 DIAGNOSIS — R293 Abnormal posture: Secondary | ICD-10-CM

## 2021-03-23 DIAGNOSIS — R2689 Other abnormalities of gait and mobility: Secondary | ICD-10-CM

## 2021-03-23 NOTE — Therapy (Signed)
[facility] [address] [place], [name], [address] Phone: [phone]   Fax:  [phone]  Physical Therapy Treatment  Patient Details  Name: Hannah Powell MRN: [id number] Date of Birth: 12/17/1945 Referring Provider (PT): Dr. Ottie Glazier   Encounter Date: 03/23/2021   PT End of Session - 03/23/21 0955     Visit Number 7    Number of Visits 16    Date for PT Re-Evaluation 04/26/21    Authorization - Visit Number 7    Authorization - Number of Visits 10    PT Start Time 0940    PT Stop Time 1032    PT Time Calculation (min) 52 min    Equipment Utilized During Treatment Gait belt    Activity Tolerance Patient tolerated treatment well;Patient limited by fatigue    Behavior During Therapy WFL for tasks assessed/performed             Past Medical History:  Diagnosis Date   Anxiety    Arthritis    CHF (congestive heart failure) (HCC)    COPD (chronic obstructive pulmonary disease) ([place])    Dysplastic nevus 04/22/2019   R abdomen parallel to sup umbilicus - mod   Dysplastic nevus 04/22/2019   R low back above waistline - mild    Edema    feet/legs   Hypertension    dr Otho Najjar      Hypothyroidism    Leg weakness, bilateral    Osteoporosis    Oxygen deficit    2l with bipap at night   RLS (restless legs syndrome)    Shortness of breath    Sleep apnea    bipap   Wheezing     Past Surgical History:  Procedure Laterality Date   BACK SURGERY     cervical   BREAST BIOPSY Left    needle bx-neg   CATARACT EXTRACTION W/PHACO Left 05/07/2017   Procedure: CATARACT EXTRACTION PHACO AND INTRAOCULAR LENS PLACEMENT ([place]);  Surgeon: Birder Robson, MD;  Location: [facility];  Service: Ophthalmology;  Laterality: Left;  [place] 00:48.7 AP% 11.3 CDE 5.46 Fluid Pack Lot # [id number]   CATARACT EXTRACTION W/PHACO Right 04/23/2017   Procedure: CATARACT EXTRACTION PHACO AND INTRAOCULAR LENS PLACEMENT ([facility]);  Surgeon:  Birder Robson, MD;  Location: [facility];  Service: Ophthalmology;  Laterality: Right;  [place] 00:29 AP% 12.8 CDE 3.79 FLUID PACK LOT # [id number]   FOOT ARTHROPLASTY     JOINT REPLACEMENT     x2 tkr   TOTAL SHOULDER ARTHROPLASTY Left 07/02/2013   Procedure: LEFT TOTAL SHOULDER ARTHROPLASTY;  Surgeon: Marin Shutter, MD;  Location: [place];  Service: Orthopedics;  Laterality: Left;   TOTAL SHOULDER ARTHROPLASTY Right 04/10/2018   Procedure: TOTAL REVERSE SHOULDER ARTHROPLASTY;  Surgeon: Justice Britain, MD;  [facility];  Service: Orthopedics;  Laterality: Right;  159min    There were no vitals filed for this visit.   Subjective Assessment - 03/23/21 0954     Subjective Pt. reports some low back pain after waking up this morning but doing better at this time.  Pt. has appt. on 3/13 for low back injection.    Pertinent History extensive PMH.  Pt. was participating with Pulmonary Rehab and pt. states that she is hoping to return to Pulmonary Rehab when appropriate.  Pt. states they were concerned about her LE strength/ balance getting on/off equipment.    Limitations Standing;Walking;House hold activities    Patient Stated Goals Increase B LE  muscle strength/ return to Pulmonary Rehab    Currently in Pain? No/denies                 Ther.ex.:   Nustep L4 12+ min. Seat 10 for 10 min. With B UE/LE.    Neuro:  Seated in gray chair working on sit to stands with Airex pad to elevate height (mirror feedback).  Working on standing without UE assist/ use of armrests.       Seated posture correction: added shoulder flexion/ abduction/ scap. retraction with breathing  Forward (high marching)/backwards/lateral walking in //-bars 2x each direction with cuing to maintain upright posture/ head position.  CGA for safety.  Seated rest break required.     Walking out to car with use of RW and moderate cuing to correct posture/ step pattern.  Pt. Challenged with ramp outside/ curb step downs.   Extra time but no standing rest break.            PT Long Term Goals - 03/03/21 1240       PT LONG TERM GOAL #1   Title Pt. will improve 5XSTS to <15 sec to improve functional mobility.    Baseline TBD    Time 8    Period Weeks    Status New    Target Date 04/26/21      PT LONG TERM GOAL #2   Title Pt. will increase B LE muscle strength 1/2 muscle grade to improve safety/ independence with functional mobility.    Baseline neurogenic claudication. Pt. presents with good strength in bilat. hip add and abd, bilat DF, and quadriceps. Pt. has 4/5 strength in B hip flex, 4+/5 in R knee flex, and R inversion. Pt. has 4/5 strength with L knee flex, R eversion, and L inverison.    Time 8    Period Weeks    Status New    Target Date 04/26/21      PT LONG TERM GOAL #3   Title Pt. will demonstrate improved normal gait speed to 0.4 m/s to decrease falls risk and improve ability to safely walk in community.    Baseline TBD    Time 8    Period Weeks    Status New    Target Date 04/26/21      PT LONG TERM GOAL #4   Title Pt. will demonstrate safety with ascending 4 stairs with at most one handrail to improve community ambulation.    Baseline Pt able to ascend/ descend stairs with both handrails and lateral step to pattern.    Time 8    Period Weeks    Status New    Target Date 04/26/21                   Plan - 03/23/21 1210     Clinical Impression Statement Moderate LE muscle fatigue noted during sit to stand ex. outside of //-basr with several short seated rest breaks.  Pt. benefits from use of Airex pad/ lift in chair to assist with standing without UE assist.  Pt. fearful of L knee buckling with increase activity/ fatigue during tasks.  No increase c/o back pain during tx. session.  Extra time but no standing rest breaks required when walking from PT gym to car at end of tx. session.  Pt. very cautious while walking down ramp outside to car.    Personal Factors and  Comorbidities Age;Comorbidity 3+    Comorbidities CPD, HTN, Obesity    Stability/Clinical Decision  Making Evolving/Moderate complexity    Clinical Decision Making Moderate    Rehab Potential Fair    PT Frequency 2x / week    PT Duration 8 weeks    PT Treatment/Interventions Cryotherapy;Electrical Stimulation;Ultrasound;Moist Heat;Gait training;Functional mobility training;Therapeutic activities;Therapeutic exercise;Neuromuscular re-education;Scar mobilization;Balance training;Stair training;Patient/family education;Manual techniques;ADLs/Self Care Home Management    PT Next Visit Plan Dynamic balance tasks/ outside walking on ramp/ curb    Consulted and Agree with Plan of Care Patient             Patient will benefit from skilled therapeutic intervention in order to improve the following deficits and impairments:  Decreased endurance, Decreased range of motion, Decreased skin integrity, Decreased strength, Hypomobility, Impaired UE functional use, Increased fascial restricitons, Pain, Postural dysfunction, Impaired flexibility, Decreased scar mobility, Decreased mobility, Decreased balance, Improper body mechanics, Abnormal gait, Decreased activity tolerance, Impaired sensation  Visit Diagnosis: Muscle weakness (generalized)  Gait difficulty  Abnormal posture  Imbalance     Problem List Patient Active Problem List   Diagnosis Date Noted   S/P reverse total shoulder arthroplasty, 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   Chronic diastolic heart failure ([place]) 08/09/2016   COPD (chronic obstructive pulmonary disease) ([place]) 08/09/2016   Pulmonary infiltrates    Pneumonia due to Streptococcus pneumoniae (HCC)    Fever    Community acquired pneumonia of right lung    Palliative care by specialist    Goals of care, counseling/discussion    Pressure injury of skin 07/03/2016   COPD exacerbation ([place]) 07/21/2015   Acute renal insufficiency 07/21/2015   Hyperglycemia 07/21/2015   Benign essential HTN  07/21/2015   S/P shoulder replacement 07/02/2013   Pura Spice, PT, DPT # [phone] 03/23/2021, 12:29 PM  [facility] [address] [place], [name], [address] Phone: [phone]   Fax:  [phone]  Name: Hannah Powell MRN: [id number] Date of Birth: 1946/02/28

## 2021-03-28 ENCOUNTER — Ambulatory Visit: Payer: Medicare HMO | Admitting: Physical Therapy

## 2021-03-28 ENCOUNTER — Encounter: Payer: Self-pay | Admitting: Physical Therapy

## 2021-03-28 ENCOUNTER — Other Ambulatory Visit: Payer: Self-pay

## 2021-03-28 DIAGNOSIS — R2689 Other abnormalities of gait and mobility: Secondary | ICD-10-CM | POA: Diagnosis not present

## 2021-03-28 DIAGNOSIS — M6281 Muscle weakness (generalized): Secondary | ICD-10-CM | POA: Diagnosis not present

## 2021-03-28 DIAGNOSIS — R293 Abnormal posture: Secondary | ICD-10-CM | POA: Diagnosis not present

## 2021-03-28 DIAGNOSIS — R269 Unspecified abnormalities of gait and mobility: Secondary | ICD-10-CM | POA: Diagnosis not present

## 2021-03-28 NOTE — Therapy (Signed)
[place] [facility] [address] [place], [name], [address] Phone: [phone]   Fax:  [phone]  Physical Therapy Treatment  Patient Details  Name: Hannah Powell MRN: [id number] Date of Birth: 12/23/1945 Referring Provider (PT): Dr. Ottie Glazier   Encounter Date: 03/28/2021   PT End of Session - 03/28/21 0850     Visit Number 8    Number of Visits 16    Date for PT Re-Evaluation 04/26/21    Authorization - Visit Number 8    Authorization - Number of Visits 10    PT Start Time [phone]    PT Stop Time 0945    PT Time Calculation (min) 54 min    Equipment Utilized During Treatment Gait belt    Activity Tolerance Patient tolerated treatment well;Patient limited by fatigue    Behavior During Therapy WFL for tasks assessed/performed             Past Medical History:  Diagnosis Date   Anxiety    Arthritis    CHF (congestive heart failure) (HCC)    COPD (chronic obstructive pulmonary disease) ([place])    Dysplastic nevus 04/22/2019   R abdomen parallel to sup umbilicus - mod   Dysplastic nevus 04/22/2019   R low back above waistline - mild    Edema    feet/legs   Hypertension    dr Otho Najjar      Hypothyroidism    Leg weakness, bilateral    Osteoporosis    Oxygen deficit    2l with bipap at night   RLS (restless legs syndrome)    Shortness of breath    Sleep apnea    bipap   Wheezing     Past Surgical History:  Procedure Laterality Date   BACK SURGERY     cervical   BREAST BIOPSY Left    needle bx-neg   CATARACT EXTRACTION W/PHACO Left 05/07/2017   Procedure: CATARACT EXTRACTION PHACO AND INTRAOCULAR LENS PLACEMENT ([place]);  Surgeon: Birder Robson, MD;  Location: [facility];  Service: Ophthalmology;  Laterality: Left;  [place] 00:48.7 AP% 11.3 CDE 5.46 Fluid Pack Lot # [id number] H   CATARACT EXTRACTION W/PHACO Right 04/23/2017   Procedure: CATARACT EXTRACTION PHACO AND INTRAOCULAR LENS PLACEMENT ([facility]);  Surgeon:  Birder Robson, MD;  Location: [facility];  Service: Ophthalmology;  Laterality: Right;  [place] 00:29 AP% 12.8 CDE 3.79 FLUID PACK LOT # [id number]   FOOT ARTHROPLASTY     JOINT REPLACEMENT     x2 tkr   TOTAL SHOULDER ARTHROPLASTY Left 07/02/2013   Procedure: LEFT TOTAL SHOULDER ARTHROPLASTY;  Surgeon: Marin Shutter, MD;  Location: [place];  Service: Orthopedics;  Laterality: Left;   TOTAL SHOULDER ARTHROPLASTY Right 04/10/2018   Procedure: TOTAL REVERSE SHOULDER ARTHROPLASTY;  Surgeon: Justice Britain, MD;  [facility];  Service: Orthopedics;  Laterality: Right;  169min    There were no vitals filed for this visit.   Subjective Assessment - 03/28/21 1005     Subjective Pt reports no low back pain at start of Tx. Pt went to the gym and biked for 40 mintues and did some standing exercises.Pt plans to start aquatic exercises this friday 03/31/2021. Pt states no complaints of pain.    Pertinent History extensive PMH.  Pt. was participating with Pulmonary Rehab and pt. states that she is hoping to return to Pulmonary Rehab when appropriate.  Pt. states they were concerned about her LE strength/ balance getting on/off equipment.    Limitations  Standing;Walking;House hold activities    Patient Stated Goals Increase B LE muscle strength/ return to Pulmonary Rehab    Currently in Pain? No/denies                 Ther.ex.:   Nustep L4 12+ min. Seat 10 for 10 min. With B UE/LE.   Standing marching/ hip abduction/ heel and toe raises in //-bars.  15x (fatigue reported).       Neuro:   Seated on blue mat table with focus on sit to stands from varying heights (5x)- fatigue.  Pt. Able to complete without UE assist from elevated heights.     Seated posture correction: added shoulder flexion/ abduction with breathing   Walking in PT clinic/ gym with use of RW and focus on upright posture/ head position with consistent recip. Step pattern.      Walking out to car with use of RW and moderate  cuing to correct posture/ step pattern.  Stepping into/ out of car.       PT Long Term Goals - 03/03/21 1240       PT LONG TERM GOAL #1   Title Pt. will improve 5XSTS to <15 sec to improve functional mobility.    Baseline TBD    Time 8    Period Weeks    Status New    Target Date 04/26/21      PT LONG TERM GOAL #2   Title Pt. will increase B LE muscle strength 1/2 muscle grade to improve safety/ independence with functional mobility.    Baseline neurogenic claudication. Pt. presents with good strength in bilat. hip add and abd, bilat DF, and quadriceps. Pt. has 4/5 strength in B hip flex, 4+/5 in R knee flex, and R inversion. Pt. has 4/5 strength with L knee flex, R eversion, and L inverison.    Time 8    Period Weeks    Status New    Target Date 04/26/21      PT LONG TERM GOAL #3   Title Pt. will demonstrate improved normal gait speed to 0.4 m/s to decrease falls risk and improve ability to safely walk in community.    Baseline TBD    Time 8    Period Weeks    Status New    Target Date 04/26/21      PT LONG TERM GOAL #4   Title Pt. will demonstrate safety with ascending 4 stairs with at most one handrail to improve community ambulation.    Baseline Pt able to ascend/ descend stairs with both handrails and lateral step to pattern.    Time 8    Period Weeks    Status New    Target Date 04/26/21                   Plan - 03/28/21 1009     Clinical Impression Statement Pt has moderate fatigue throughout the session. Pt needed longer breaks during STS to allow proper form during reps. Pt has some hesitance with WB on left LE. Pt was able to work through Tx with no increase in pain but was limited based on fatigue and strength. Pt will continue to progress Tx and HEP to increase strength and decrease fatigue.  No LOB but moderate UE assist with all standing/ walking tasks.    Personal Factors and Comorbidities Age;Comorbidity 3+    Comorbidities CPD, HTN, Obesity     Stability/Clinical Decision Making Evolving/Moderate complexity    Clinical Decision  Making Moderate    Rehab Potential Fair    PT Frequency 2x / week    PT Duration 8 weeks    PT Treatment/Interventions Cryotherapy;Electrical Stimulation;Ultrasound;Moist Heat;Gait training;Functional mobility training;Therapeutic activities;Therapeutic exercise;Neuromuscular re-education;Scar mobilization;Balance training;Stair training;Patient/family education;Manual techniques;ADLs/Self Care Home Management    PT Next Visit Plan Dynamic balance tasks/ outside walking on ramp/ curb.  SEND PULMONARY REHAB UPDATE    Consulted and Agree with Plan of Care Patient             Patient will benefit from skilled therapeutic intervention in order to improve the following deficits and impairments:  Decreased endurance, Decreased range of motion, Decreased skin integrity, Decreased strength, Hypomobility, Impaired UE functional use, Increased fascial restricitons, Pain, Postural dysfunction, Impaired flexibility, Decreased scar mobility, Decreased mobility, Decreased balance, Improper body mechanics, Abnormal gait, Decreased activity tolerance, Impaired sensation  Visit Diagnosis: Muscle weakness (generalized)  Gait difficulty  Abnormal posture  Imbalance     Problem List Patient Active Problem List   Diagnosis Date Noted   S/P reverse total shoulder arthroplasty, 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   Chronic diastolic heart failure ([place]) 08/09/2016   COPD (chronic obstructive pulmonary disease) ([place]) 08/09/2016   Pulmonary infiltrates    Pneumonia due to Streptococcus pneumoniae ([place])    Fever    Community acquired pneumonia of right lung    Palliative care by specialist    Goals of care, counseling/discussion    Pressure injury of skin 07/03/2016   COPD exacerbation ([place]) 07/21/2015   Acute renal insufficiency 07/21/2015   Hyperglycemia 07/21/2015   Benign essential HTN 07/21/2015   S/P shoulder replacement  07/02/2013    Pura Spice, PT, DPT # [phone] 03/28/2021, 10:16 AM   [facility] [address] [place], [name], [address] Phone: [phone]   Fax:  [phone]  Name: Hannah Powell MRN: [id number] Date of Birth: [birth year]

## 2021-03-30 ENCOUNTER — Encounter: Payer: Self-pay | Admitting: Physical Therapy

## 2021-03-30 ENCOUNTER — Ambulatory Visit: Payer: Medicare HMO | Admitting: Physical Therapy

## 2021-03-30 ENCOUNTER — Other Ambulatory Visit: Payer: Self-pay

## 2021-03-30 DIAGNOSIS — R293 Abnormal posture: Secondary | ICD-10-CM | POA: Diagnosis not present

## 2021-03-30 DIAGNOSIS — R269 Unspecified abnormalities of gait and mobility: Secondary | ICD-10-CM

## 2021-03-30 DIAGNOSIS — M6281 Muscle weakness (generalized): Secondary | ICD-10-CM

## 2021-03-30 DIAGNOSIS — R2689 Other abnormalities of gait and mobility: Secondary | ICD-10-CM | POA: Diagnosis not present

## 2021-03-30 NOTE — Therapy (Signed)
[facility] [address] [place], [name], [address] Phone: [phone]   Fax:  [phone]  Physical Therapy Treatment  Patient Details  Name: Hannah Powell MRN: [id number] Date of Birth: Mar 02, 1946 Referring Provider (PT): Dr. Ottie Glazier   Encounter Date: 03/30/2021   PT End of Session - 04/01/21 0306     Visit Number 9    Number of Visits 16    Date for PT Re-Evaluation 04/26/21    Authorization - Visit Number 9    Authorization - Number of Visits 10    PT Start Time 0904    PT Stop Time 0950    PT Time Calculation (min) 46 min    Equipment Utilized During Treatment Gait belt    Activity Tolerance Patient tolerated treatment well;Patient limited by fatigue    Behavior During Therapy WFL for tasks assessed/performed             Past Medical History:  Diagnosis Date   Anxiety    Arthritis    CHF (congestive heart failure) (HCC)    COPD (chronic obstructive pulmonary disease) ([place])    Dysplastic nevus 04/22/2019   R abdomen parallel to sup umbilicus - mod   Dysplastic nevus 04/22/2019   R low back above waistline - mild    Edema    feet/legs   Hypertension    dr Otho Najjar      Hypothyroidism    Leg weakness, bilateral    Osteoporosis    Oxygen deficit    2l with bipap at night   RLS (restless legs syndrome)    Shortness of breath    Sleep apnea    bipap   Wheezing     Past Surgical History:  Procedure Laterality Date   BACK SURGERY     cervical   BREAST BIOPSY Left    needle bx-neg   CATARACT EXTRACTION W/PHACO Left 05/07/2017   Procedure: CATARACT EXTRACTION PHACO AND INTRAOCULAR LENS PLACEMENT ([place]);  Surgeon: Birder Robson, MD;  Location: [facility];  Service: Ophthalmology;  Laterality: Left;  [place] 00:48.7 AP% 11.3 CDE 5.46 Fluid Pack Lot # [id number] H   CATARACT EXTRACTION W/PHACO Right 04/23/2017   Procedure: CATARACT EXTRACTION PHACO AND INTRAOCULAR LENS PLACEMENT ([facility]);  Surgeon:  Birder Robson, MD;  Location: [facility];  Service: Ophthalmology;  Laterality: Right;  [place] 00:29 AP% 12.8 CDE 3.79 FLUID PACK LOT # [id number] H   FOOT ARTHROPLASTY     JOINT REPLACEMENT     x2 tkr   TOTAL SHOULDER ARTHROPLASTY Left 07/02/2013   Procedure: LEFT TOTAL SHOULDER ARTHROPLASTY;  Surgeon: Marin Shutter, MD;  Location: [place];  Service: Orthopedics;  Laterality: Left;   TOTAL SHOULDER ARTHROPLASTY Right 04/10/2018   Procedure: TOTAL REVERSE SHOULDER ARTHROPLASTY;  Surgeon: Justice Britain, MD;  [facility];  Service: Orthopedics;  Laterality: Right;  155min    There were no vitals filed for this visit.   Subjective Assessment - 04/01/21 0300     Subjective Pt. entered PT with no new complaints.  Pt. reports no low back pain at this time.    Pertinent History extensive PMH.  Pt. was participating with Pulmonary Rehab and pt. states that she is hoping to return to Pulmonary Rehab when appropriate.  Pt. states they were concerned about her LE strength/ balance getting on/off equipment.    Limitations Standing;Walking;House hold activities    Patient Stated Goals Increase B LE muscle strength/ return to Pulmonary Rehab    Currently  in Pain? No/denies                   Ther.ex.:   Nustep L4 Seat 10 for 10 min. With B UE/LE.    Walking in //-bars: marching/ lateral walking/ heel and toe raises in //-bars.  2 laps each.  Cuing for posture.    Seated LAQ/ marching 20x.       Neuro:   Seated on blue mat table with focus on sit to stands from varying heights (5x)- fatigue.  Pt. Able to complete without UE assist from elevated heights.  Table height: 20.5 inches.   Seated posture correction: added shoulder flexion/ abduction with breathing.  Standing shoulder flexion 20x.   Walking from blue mat table to //-bar with RW (pt. Reports "feeling wobbly").     Walking out to car with use of RW and moderate cuing to correct posture/ step pattern.  Stepping into/ out of car.          PT Long Term Goals - 03/03/21 1240       PT LONG TERM GOAL #1   Title Pt. will improve 5XSTS to <15 sec to improve functional mobility.    Baseline TBD    Time 8    Period Weeks    Status New    Target Date 04/26/21      PT LONG TERM GOAL #2   Title Pt. will increase B LE muscle strength 1/2 muscle grade to improve safety/ independence with functional mobility.    Baseline neurogenic claudication. Pt. presents with good strength in bilat. hip add and abd, bilat DF, and quadriceps. Pt. has 4/5 strength in B hip flex, 4+/5 in R knee flex, and R inversion. Pt. has 4/5 strength with L knee flex, R eversion, and L inverison.    Time 8    Period Weeks    Status New    Target Date 04/26/21      PT LONG TERM GOAL #3   Title Pt. will demonstrate improved normal gait speed to 0.4 m/s to decrease falls risk and improve ability to safely walk in community.    Baseline TBD    Time 8    Period Weeks    Status New    Target Date 04/26/21      PT LONG TERM GOAL #4   Title Pt. will demonstrate safety with ascending 4 stairs with at most one handrail to improve community ambulation.    Baseline Pt able to ascend/ descend stairs with both handrails and lateral step to pattern.    Time 8    Period Weeks    Status New    Target Date 04/26/21                   Plan - 04/01/21 0307     Clinical Impression Statement Pt. fatigues with repeated STS from blue mat table with focus on limited to no UE assist at varying heights.  Pt. able to stand without use of UE assist with chair height >20 inches.  Pt. requires several rest breaks between tasks and use of RW with all aspects of walking for safety.  No knee buckling and PT discussed pts. confidence with walking/ prolonged standing.  No LOB during tx. session and pt. able to walking to car after tx. at side of building with no standing rest breaks and manage RW into back seat independently.  Pt. may return to aquatic ex. program  tomorrow.  Personal Factors and Comorbidities Age;Comorbidity 3+    Comorbidities CPD, HTN, Obesity    Stability/Clinical Decision Making Evolving/Moderate complexity    Clinical Decision Making Moderate    Rehab Potential Fair    PT Frequency 2x / week    PT Duration 8 weeks    PT Treatment/Interventions Cryotherapy;Electrical Stimulation;Ultrasound;Moist Heat;Gait training;Functional mobility training;Therapeutic activities;Therapeutic exercise;Neuromuscular re-education;Scar mobilization;Balance training;Stair training;Patient/family education;Manual techniques;ADLs/Self Care Home Management    PT Next Visit Plan Dynamic balance tasks/ outside walking on ramp/ curb.  DISCUSS RETURN TO PULMONARY REHAB    Consulted and Agree with Plan of Care Patient             Patient will benefit from skilled therapeutic intervention in order to improve the following deficits and impairments:  Decreased endurance, Decreased range of motion, Decreased skin integrity, Decreased strength, Hypomobility, Impaired UE functional use, Increased fascial restricitons, Pain, Postural dysfunction, Impaired flexibility, Decreased scar mobility, Decreased mobility, Decreased balance, Improper body mechanics, Abnormal gait, Decreased activity tolerance, Impaired sensation  Visit Diagnosis: Muscle weakness (generalized)  Gait difficulty  Abnormal posture  Imbalance     Problem List Patient Active Problem List   Diagnosis Date Noted   S/P reverse total shoulder arthroplasty, 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   Chronic diastolic heart failure ([place]) 08/09/2016   COPD (chronic obstructive pulmonary disease) ([place]) 08/09/2016   Pulmonary infiltrates    Pneumonia due to Streptococcus pneumoniae ([place])    Fever    Community acquired pneumonia of right lung    Palliative care by specialist    Goals of care, counseling/discussion    Pressure injury of skin 07/03/2016   COPD exacerbation ([place]) 07/21/2015   Acute renal  insufficiency 07/21/2015   Hyperglycemia 07/21/2015   Benign essential HTN 07/21/2015   S/P shoulder replacement 07/02/2013   Pura Spice, PT, DPT # [id number] 04/01/2021, 3:16 AM  [facility] [address] [place], [name], [address] Phone: [phone]   Fax:  [phone]  Name: Hannah Powell MRN: [id number] Date of Birth: 03-23-1945

## 2021-04-04 ENCOUNTER — Other Ambulatory Visit: Payer: Self-pay

## 2021-04-04 ENCOUNTER — Encounter: Payer: Self-pay | Admitting: Physical Therapy

## 2021-04-04 ENCOUNTER — Ambulatory Visit: Payer: Medicare HMO | Admitting: Physical Therapy

## 2021-04-04 ENCOUNTER — Encounter: Payer: Self-pay | Admitting: *Deleted

## 2021-04-04 DIAGNOSIS — R2689 Other abnormalities of gait and mobility: Secondary | ICD-10-CM | POA: Diagnosis not present

## 2021-04-04 DIAGNOSIS — J449 Chronic obstructive pulmonary disease, unspecified: Secondary | ICD-10-CM

## 2021-04-04 DIAGNOSIS — R269 Unspecified abnormalities of gait and mobility: Secondary | ICD-10-CM | POA: Diagnosis not present

## 2021-04-04 DIAGNOSIS — R293 Abnormal posture: Secondary | ICD-10-CM | POA: Diagnosis not present

## 2021-04-04 DIAGNOSIS — M6281 Muscle weakness (generalized): Secondary | ICD-10-CM | POA: Diagnosis not present

## 2021-04-04 NOTE — Therapy (Signed)
[facility] [address] [place], [name], [address] Phone: [phone]   Fax:  [phone]  Physical Therapy Treatment Physical Therapy Progress Note   Dates of reporting period  03/01/21  to 04/04/21  Patient Details  Name: Hannah Powell MRN: [id number] Date of Birth: 19-Aug-1945 Referring Provider (PT): Dr. Ottie Glazier   Encounter Date: 04/04/2021   PT End of Session - 04/04/21 0943     Visit Number 10    Number of Visits 16    Date for PT Re-Evaluation 04/26/21    Authorization - Visit Number 10    Authorization - Number of Visits 10    PT Start Time [phone]    PT Stop Time 1035    PT Time Calculation (min) 52 min    Equipment Utilized During Treatment Gait belt    Activity Tolerance Patient tolerated treatment well;Patient limited by fatigue    Behavior During Therapy WFL for tasks assessed/performed             Past Medical History:  Diagnosis Date   Anxiety    Arthritis    CHF (congestive heart failure) (HCC)    COPD (chronic obstructive pulmonary disease) ([place])    Dysplastic nevus 04/22/2019   R abdomen parallel to sup umbilicus - mod   Dysplastic nevus 04/22/2019   R low back above waistline - mild    Edema    feet/legs   Hypertension    dr Otho Najjar      Hypothyroidism    Leg weakness, bilateral    Osteoporosis    Oxygen deficit    2l with bipap at night   RLS (restless legs syndrome)    Shortness of breath    Sleep apnea    bipap   Wheezing     Past Surgical History:  Procedure Laterality Date   BACK SURGERY     cervical   BREAST BIOPSY Left    needle bx-neg   CATARACT EXTRACTION W/PHACO Left 05/07/2017   Procedure: CATARACT EXTRACTION PHACO AND INTRAOCULAR LENS PLACEMENT ([place]);  Surgeon: Birder Robson, MD;  Location: [facility];  Service: Ophthalmology;  Laterality: Left;  [place] 00:48.7 AP% 11.3 CDE 5.46 Fluid Pack Lot # [id number]   CATARACT EXTRACTION W/PHACO Right 04/23/2017    Procedure: CATARACT EXTRACTION PHACO AND INTRAOCULAR LENS PLACEMENT ([facility]);  Surgeon: Birder Robson, MD;  Location: [facility];  Service: Ophthalmology;  Laterality: Right;  [place] 00:29 AP% 12.8 CDE 3.79 FLUID PACK LOT # [id number]   FOOT ARTHROPLASTY     JOINT REPLACEMENT     x2 tkr   TOTAL SHOULDER ARTHROPLASTY Left 07/02/2013   Procedure: LEFT TOTAL SHOULDER ARTHROPLASTY;  Surgeon: Marin Shutter, MD;  Location: [place];  Service: Orthopedics;  Laterality: Left;   TOTAL SHOULDER ARTHROPLASTY Right 04/10/2018   Procedure: TOTAL REVERSE SHOULDER ARTHROPLASTY;  Surgeon: Justice Britain, MD;  Location: [facility];  Service: Orthopedics;  Laterality: Right;  157mn    There were no vitals filed for this visit.   Subjective Assessment - 04/04/21 0934     Subjective Pt. entered PT with use of RW and no new complaints.    Pertinent History extensive PMH.  Pt. was participating with Pulmonary Rehab and pt. states that she is hoping to return to Pulmonary Rehab when appropriate.  Pt. states they were concerned about her LE strength/ balance getting on/off equipment.    Limitations Standing;Walking;House hold activities    Patient Stated Goals Increase B LE muscle  strength/ return to Pulmonary Rehab    Currently in Pain? No/denies                Ther.ex.:   Nustep L4 Seat 10 for 10 min. With B UE/LE.   GTB seated scap. Retraction/ standing sh. Extension 20x each.   Seated LAQ/ marching 20x.     Walking in //-bars: marching/ lateral walking/ backwards walking 2 laps each.  Cuing for posture.        Neuro:   Seated on blue mat table with focus on sit to stands from varying heights (5x)- fatigue.  Pt. Able to complete without UE assist from elevated heights.  Table height: 20.5 inches.    Seated posture correction: added shoulder flexion/ abduction with breathing.  Standing partial lunges/ stepping with RW assist.    Walking from blue mat table to //-bar with RW.     Walking out to car with  use of RW and moderate cuing to correct posture/ step pattern.  Stepping into/ out of car.          PT Long Term Goals - 04/04/21 1201       PT LONG TERM GOAL #1   Title Pt. will improve 5XSTS to <15 sec to improve functional mobility.    Baseline >20 sec.    Time 8    Period Weeks    Status Partially Met    Target Date 04/26/21      PT LONG TERM GOAL #2   Title Pt. will increase B LE muscle strength 1/2 muscle grade to improve safety/ independence with functional mobility.    Baseline neurogenic claudication. Pt. presents with good strength in bilat. hip add and abd, bilat DF, and quadriceps. Pt. has 4/5 strength in B hip flex, 4+/5 in R knee flex, and R inversion. Pt. has 4/5 strength with L knee flex, R eversion, and L inverison.    Time 8    Period Weeks    Status Partially Met    Target Date 04/26/21      PT LONG TERM GOAL #3   Title Pt. will demonstrate improved normal gait speed to 0.4 m/s to decrease falls risk and improve ability to safely walk in community.    Baseline TBD    Time 8    Period Weeks    Status On-going    Target Date 04/26/21      PT LONG TERM GOAL #4   Title Pt. will demonstrate safety with ascending 4 stairs with at most one handrail to improve community ambulation.    Baseline Pt able to ascend/ descend stairs with both handrails and lateral step to pattern.    Time 8    Period Weeks    Status Partially Met    Target Date 04/26/21                   Plan - 04/04/21 0943     Clinical Impression Statement PT and pt. discussed continuing PT until end of month and then a possible return to Pulmonary Rehab.  Pt. reports benefit in B LE muscle strengthening since starting PT tx.  Pt. has been compliant with gym based ex. several times/ week with husband to focus on bike/ standing hip ex.  Pt. requires several seated rest breaks between sit to stand/ ther.ex.  Pt. able to complete partial lunges/ hip ex. with B UE assist required for safety.   No LOB during tx. session and pt. continues to progress  with LE strengthening.  Pt. requires UE assist with all sit to stands when chair height is <20 inches.  Pt. demonstrates mod. I with safe sit to stands from elevated chair without UE assist or from chair with arm rests.  Pt. will continue to benefit from skilled PT services to increase LE strengthening to improve independence/ safety with functional mobility.    Personal Factors and Comorbidities Age;Comorbidity 3+    Comorbidities CPD, HTN, Obesity    Stability/Clinical Decision Making Evolving/Moderate complexity    Clinical Decision Making Moderate    Rehab Potential Fair    PT Frequency 2x / week    PT Duration 8 weeks    PT Treatment/Interventions Cryotherapy;Electrical Stimulation;Ultrasound;Moist Heat;Gait training;Functional mobility training;Therapeutic activities;Therapeutic exercise;Neuromuscular re-education;Scar mobilization;Balance training;Stair training;Patient/family education;Manual techniques;ADLs/Self Care Home Management    PT Next Visit Plan Dynamic balance tasks/ outside walking on ramp/ curb.    Consulted and Agree with Plan of Care Patient             Patient will benefit from skilled therapeutic intervention in order to improve the following deficits and impairments:  Decreased endurance, Decreased range of motion, Decreased skin integrity, Decreased strength, Hypomobility, Impaired UE functional use, Increased fascial restricitons, Pain, Postural dysfunction, Impaired flexibility, Decreased scar mobility, Decreased mobility, Decreased balance, Improper body mechanics, Abnormal gait, Decreased activity tolerance, Impaired sensation  Visit Diagnosis: Muscle weakness (generalized)  Gait difficulty  Abnormal posture  Imbalance     Problem List Patient Active Problem List   Diagnosis Date Noted   S/P reverse total shoulder arthroplasty, 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   Chronic diastolic heart failure ([place]) 08/09/2016    COPD (chronic obstructive pulmonary disease) ([place]) 08/09/2016   Pulmonary infiltrates    Pneumonia due to Streptococcus pneumoniae ([place])    Fever    Community acquired pneumonia of right lung    Palliative care by specialist    Goals of care, counseling/discussion    Pressure injury of skin 07/03/2016   COPD exacerbation ([place]) 07/21/2015   Acute renal insufficiency 07/21/2015   Hyperglycemia 07/21/2015   Benign essential HTN 07/21/2015   S/P shoulder replacement 07/02/2013   Pura Spice, PT, DPT # [phone] 04/04/2021, 3:05 PM  [facility] [address] [place], [name], [address] Phone: [phone]   Fax:  [phone]  Name: Rylan Kaufmann MRN: [id number] Date of Birth: [birth year]

## 2021-04-06 ENCOUNTER — Encounter: Payer: Self-pay | Admitting: Physical Therapy

## 2021-04-06 ENCOUNTER — Other Ambulatory Visit: Payer: Self-pay

## 2021-04-06 ENCOUNTER — Ambulatory Visit: Payer: Medicare HMO | Admitting: Physical Therapy

## 2021-04-06 DIAGNOSIS — M6281 Muscle weakness (generalized): Secondary | ICD-10-CM

## 2021-04-06 DIAGNOSIS — R2689 Other abnormalities of gait and mobility: Secondary | ICD-10-CM

## 2021-04-06 DIAGNOSIS — R269 Unspecified abnormalities of gait and mobility: Secondary | ICD-10-CM

## 2021-04-06 DIAGNOSIS — Z96611 Presence of right artificial shoulder joint: Secondary | ICD-10-CM

## 2021-04-06 DIAGNOSIS — R293 Abnormal posture: Secondary | ICD-10-CM

## 2021-04-09 NOTE — Therapy (Signed)
[facility] [address] [place], [name], [address] Phone: [phone]   Fax:  [phone]  Physical Therapy Treatment  Patient Details  Name: Hannah Powell MRN: [id number] Date of Birth: 10-Jan-1946 Referring Provider (PT): Dr. Ottie Glazier   Encounter Date: 04/06/2021  Treatment: 11 of 16.  Recert date: 06/25/7024 3785 to 1331   Past Medical History:  Diagnosis Date   Anxiety    Arthritis    CHF (congestive heart failure) (HCC)    COPD (chronic obstructive pulmonary disease) (HCC)    Dysplastic nevus 04/22/2019   R abdomen parallel to sup umbilicus - mod   Dysplastic nevus 04/22/2019   R low back above waistline - mild    Edema    feet/legs   Hypertension    dr Otho Najjar      Hypothyroidism    Leg weakness, bilateral    Osteoporosis    Oxygen deficit    2l with bipap at night   RLS (restless legs syndrome)    Shortness of breath    Sleep apnea    bipap   Wheezing     Past Surgical History:  Procedure Laterality Date   BACK SURGERY     cervical   BREAST BIOPSY Left    needle bx-neg   CATARACT EXTRACTION W/PHACO Left 05/07/2017   Procedure: CATARACT EXTRACTION PHACO AND INTRAOCULAR LENS PLACEMENT ([place]);  Surgeon: Birder Robson, MD;  Location: [facility];  Service: Ophthalmology;  Laterality: Left;  [place] 00:48.7 AP% 11.3 CDE 5.46 Fluid Pack Lot # [id number] H   CATARACT EXTRACTION W/PHACO Right 04/23/2017   Procedure: CATARACT EXTRACTION PHACO AND INTRAOCULAR LENS PLACEMENT ([facility]);  Surgeon: Birder Robson, MD;  Location: [facility];  Service: Ophthalmology;  Laterality: Right;  [place] 00:29 AP% 12.8 CDE 3.79 FLUID PACK LOT # [id number] H   FOOT ARTHROPLASTY     JOINT REPLACEMENT     x2 tkr   TOTAL SHOULDER ARTHROPLASTY Left 07/02/2013   Procedure: LEFT TOTAL SHOULDER ARTHROPLASTY;  Surgeon: Marin Shutter, MD;  Location: [place];  Service: Orthopedics;  Laterality: Left;   TOTAL SHOULDER ARTHROPLASTY Right 04/10/2018    Procedure: TOTAL REVERSE SHOULDER ARTHROPLASTY;  Surgeon: Justice Britain, MD;  [facility]: [facility];  Service: Orthopedics;  Laterality: Right;  1105mn    There were no vitals filed for this visit.    Pt. arrived to PT with use of RW and moderate kyphotic posture/ forward head. Pt. aware of her posture and discussed posture brace/ change in head position with PT. Pt. states she has a f/u with MD in early February.      Ther.ex.:   Nustep L4 Seat 10 for 12 min. With B UE/LE.  (0.5 miles)    Seated/ standing 4# ex.: LAQ/ marching/ standing hip abduction/ flexion/ heel and toe raises 20x in //-bars for safety.    Standing GTB scap. Retraction/ standing sh. Extension 20x each.   Walking in //-bars: marching/ lateral walking/ backwards walking 2 laps each.  Cuing for posture.     Discussed posture brace/ HEP     Neuro:   Sit to stands from vary heights on blue mat table (5x)- fatigue.  Pt. Able to complete without UE assist from elevated heights.  Table height: 20.5 inches.    Seated posture correction: added shoulder flexion/ abduction with breathing.  Standing partial lunges/ stepping with RW assist.    Walking from blue mat table to //-bar with RW.  PT Long Term Goals - 04/04/21 1201       PT LONG TERM GOAL #1   Title Pt. will improve 5XSTS to <15 sec to improve functional mobility.    Baseline >20 sec.    Time 8    Period Weeks    Status Partially Met    Target Date 04/26/21      PT LONG TERM GOAL #2   Title Pt. will increase B LE muscle strength 1/2 muscle grade to improve safety/ independence with functional mobility.    Baseline neurogenic claudication. Pt. presents with good strength in bilat. hip add and abd, bilat DF, and quadriceps. Pt. has 4/5 strength in B hip flex, 4+/5 in R knee flex, and R inversion. Pt. has 4/5 strength with L knee flex, R eversion, and L inverison.    Time 8    Period Weeks    Status Partially Met    Target Date 04/26/21       PT LONG TERM GOAL #3   Title Pt. will demonstrate improved normal gait speed to 0.4 m/s to decrease falls risk and improve ability to safely walk in community.    Baseline TBD    Time 8    Period Weeks    Status On-going    Target Date 04/26/21      PT LONG TERM GOAL #4   Title Pt. will demonstrate safety with ascending 4 stairs with at most one handrail to improve community ambulation.    Baseline Pt able to ascend/ descend stairs with both handrails and lateral step to pattern.    Time 8    Period Weeks    Status Partially Met    Target Date 04/26/21             Moderate LE muscle fatigue with use of 4# ankle wts. with //-bar walking/ standing hip exercises. Several short seated rest breaks required during tx. session. PT incorporated standing resisted therex. to focus on posture/ standing endurance (moderate fatigue noted). Pt. works hard during tx. session and focused on getting hips/ LE stronger to improve STS/ walking. Pt. completed 0.5 miles on Nustep in a 12 minute time period. Pt. encouraged to continue with gym based ex. and daily walking.       Patient will benefit from skilled therapeutic intervention in order to improve the following deficits and impairments:  Decreased endurance, Decreased range of motion, Decreased skin integrity, Decreased strength, Hypomobility, Impaired UE functional use, Increased fascial restricitons, Pain, Postural dysfunction, Impaired flexibility, Decreased scar mobility, Decreased mobility, Decreased balance, Improper body mechanics, Abnormal gait, Decreased activity tolerance, Impaired sensation  Visit Diagnosis: Muscle weakness (generalized)  Gait difficulty  Abnormal posture  Imbalance     Problem List Patient Active Problem List   Diagnosis Date Noted   S/P reverse total shoulder arthroplasty, 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   Chronic diastolic heart failure ([place]) 08/09/2016   COPD (chronic obstructive pulmonary disease) ([place])  08/09/2016   Pulmonary infiltrates    Pneumonia due to Streptococcus pneumoniae ([place])    Fever    Community acquired pneumonia of right lung    Palliative care by specialist    Goals of care, counseling/discussion    Pressure injury of skin 07/03/2016   COPD exacerbation ([place]) 07/21/2015   Acute renal insufficiency 07/21/2015   Hyperglycemia 07/21/2015   Benign essential HTN 07/21/2015   S/P shoulder replacement 07/02/2013   Pura Spice, PT, DPT # [phone] 04/09/2021, 5:53 PM  [place]  [facility] [address] [place], [name], [address] Phone: [phone]   Fax:  [phone]  Name: Malisa Ruggiero MRN: [id number] Date of Birth: July 07, 1945

## 2021-04-11 ENCOUNTER — Ambulatory Visit: Payer: Medicare HMO | Admitting: Physical Therapy

## 2021-04-12 ENCOUNTER — Encounter: Payer: Self-pay | Admitting: *Deleted

## 2021-04-12 DIAGNOSIS — J449 Chronic obstructive pulmonary disease, unspecified: Secondary | ICD-10-CM

## 2021-04-12 NOTE — Progress Notes (Signed)
Pulmonary Individual Treatment Plan  Patient Details  Name: Hannah Powell MRN: [id number] Date of Birth: 1945-04-15 Referring Provider:   Flowsheet Row Pulmonary Rehab from 01/10/2021 in [facility] Cardiac and Pulmonary Rehab  Referring Provider Ottie Glazier MD       Initial Encounter Date:  Flowsheet Row Pulmonary Rehab from 01/10/2021 in [facility] Cardiac and Pulmonary Rehab  Date 01/10/21       Visit Diagnosis: Chronic obstructive pulmonary disease, unspecified COPD type ([place])  Patient's Home Medications on Admission:  Current Outpatient Medications:    albuterol (PROVENTIL) (2.5 MG/3ML) 0.083% nebulizer solution, Take 2.5 mg by nebulization every 6 (six) hours as needed for wheezing or shortness of breath., Disp: , Rfl:    ALPRAZolam (XANAX) 0.25 MG tablet, Take 1 tablet (0.25 mg total) by mouth 3 (three) times daily as needed for anxiety., Disp: 30 tablet, Rfl: 0   ALPRAZolam (XANAX) 0.25 MG tablet, Take 1 tablet by mouth 3 (three) times daily as needed. (Patient not taking: Reported on 12/05/2020), Disp: , Rfl:    amoxicillin (AMOXIL) 500 MG capsule, amoxicillin 500 mg capsule (Patient not taking: Reported on 12/05/2020), Disp: , Rfl:    azelastine (ASTELIN) 0.1 % nasal spray, , Disp: , Rfl:    azithromycin (ZITHROMAX) 250 MG tablet, TAKE 1 TABLET (250 MG TOTAL) BY MOUTH DAILY., Disp: 90 tablet, Rfl: 1   B Complex-C (B-COMPLEX WITH VITAMIN C) tablet, Take 1 tablet by mouth daily. , Disp: , Rfl:    calcium carbonate (OSCAL) 1500 (600 Ca) MG TABS tablet, Take by mouth., Disp: , Rfl:    Calcium Carbonate-Vitamin D (CALCIUM-VITAMIN D) 500-200 MG-UNIT per tablet, Take 1 tablet by mouth daily., Disp: , Rfl:    cyclobenzaprine (FLEXERIL) 10 MG tablet, Take 10 mg by mouth 3 (three) times daily as needed for muscle spasms.  (Patient not taking: Reported on 12/05/2020), Disp: , Rfl:    ferrous sulfate 325 (65 FE) MG tablet, Take 325 mg by mouth daily with breakfast., Disp: , Rfl:     fluticasone furoate-vilanterol (BREO ELLIPTA) 200-25 MCG/INH AEPB, Inhale 1 puff into the lungs daily. (Patient not taking: Reported on 12/05/2020), Disp: 1 each, Rfl: 0   fluticasone furoate-vilanterol (BREO ELLIPTA) 200-25 MCG/INH AEPB, Inhale 1 puff into the lungs daily., Disp: 60 each, Rfl: 3   fluticasone furoate-vilanterol (BREO ELLIPTA) 200-25 MCG/INH AEPB, Inhale into the lungs. (Patient not taking: Reported on 12/05/2020), Disp: , Rfl:    furosemide (LASIX) 20 MG tablet, Take 2 tablets (40 mg total) by mouth 2 (two) times daily. (Patient not taking: Reported on 12/05/2020), Disp: , Rfl:    furosemide (LASIX) 80 MG tablet, Take by mouth., Disp: , Rfl:    gabapentin (NEURONTIN) 300 MG capsule, Take 300 mg by mouth at bedtime.  (Patient not taking: Reported on 12/05/2020), Disp: , Rfl:    ibuprofen (ADVIL) 800 MG tablet, Take by mouth. (Patient not taking: Reported on 12/05/2020), Disp: , Rfl:    ibuprofen (ADVIL,MOTRIN) 200 MG tablet, Take 800 mg by mouth every 6 (six) hours as needed for headache or mild pain. , Disp: , Rfl:    ipratropium-albuterol (DUONEB) 0.5-2.5 (3) MG/3ML SOLN, Take 3 mLs by nebulization every 4 (four) hours as needed., Disp: , Rfl:    ketoconazole (NIZORAL) 2 % cream, Apply once daily under the breast and left under arm., Disp: 60 g, Rfl: 1   levalbuterol (XOPENEX) 0.31 MG/3ML nebulizer solution, Inhale into the lungs., Disp: , Rfl:    levothyroxine (SYNTHROID) 112 MCG  tablet, Take 112 mcg by mouth daily before breakfast. Take 2 tabs po QD (Patient not taking: Reported on 12/05/2020), Disp: , Rfl:    levothyroxine (SYNTHROID) 112 MCG tablet, Take by mouth., Disp: , Rfl:    levothyroxine (SYNTHROID, LEVOTHROID) 200 MCG tablet, Take 200 mcg by mouth daily before breakfast., Disp: , Rfl:    loratadine-pseudoephedrine (CLARITIN-D 24-HOUR) 10-240 MG 24 hr tablet, Take 1 tablet by mouth daily as needed for allergies., Disp: , Rfl:    losartan (COZAAR) 25 MG tablet, Take 25 mg by  mouth daily., Disp: , Rfl:    losartan (COZAAR) 25 MG tablet, Take 1 tablet by mouth daily. (Patient not taking: Reported on 12/05/2020), Disp: , Rfl:    Multiple Vitamins-Minerals (MULTIVITAMIN WOMEN PO), Take 1 tablet by mouth daily., Disp: , Rfl:    Omega-3 Fatty Acids (OMEGA 3 PO), Take 1 capsule by mouth daily., Disp: , Rfl:    oxybutynin (DITROPAN) 5 MG tablet, Take 5 mg by mouth 3 (three) times daily. (Patient not taking: Reported on 12/05/2020), Disp: , Rfl:    oxybutynin (DITROPAN) 5 MG tablet, Take 1 tablet by mouth 3 (three) times daily., Disp: , Rfl:    potassium chloride SA (K-DUR,KLOR-CON) 20 MEQ tablet, Take 20 mEq by mouth 2 (two) times daily.  (Patient not taking: Reported on 12/05/2020), Disp: , Rfl:    potassium chloride SA (KLOR-CON) 20 MEQ tablet, Take 1 tablet by mouth 2 (two) times daily., Disp: , Rfl:    pramipexole (MIRAPEX) 1 MG tablet, Take 4 mg by mouth at bedtime. , Disp: , Rfl:    spironolactone (ALDACTONE) 25 MG tablet, Take 25 mg by mouth 2 (two) times daily. , Disp: , Rfl:    spironolactone (ALDACTONE) 25 MG tablet, Take 1 tablet by mouth 2 (two) times daily. (Patient not taking: Reported on 12/05/2020), Disp: , Rfl:    topiramate (TOPAMAX) 100 MG tablet, Take 100 mg by mouth 2 (two) times daily., Disp: , Rfl:    topiramate (TOPAMAX) 100 MG tablet, Take by mouth. (Patient not taking: Reported on 12/05/2020), Disp: , Rfl:    traMADol (ULTRAM) 50 MG tablet, Take 1 tablet (50 mg total) by mouth every 8 (eight) hours as needed (for mild pain)., Disp: 30 tablet, Rfl: 0   traMADol (ULTRAM) 50 MG tablet, Take 1 tablet by mouth every 8 (eight) hours as needed. (Patient not taking: Reported on 12/05/2020), Disp: , Rfl:    zolpidem (AMBIEN) 10 MG tablet, Take 5 mg by mouth at bedtime as needed for sleep., Disp: , Rfl:   Past Medical History: Past Medical History:  Diagnosis Date   Anxiety    Arthritis    CHF (congestive heart failure) (HCC)    COPD (chronic obstructive  pulmonary disease) ([place])    Dysplastic nevus 04/22/2019   R abdomen parallel to sup umbilicus - mod   Dysplastic nevus 04/22/2019   R low back above waistline - mild    Edema    feet/legs   Hypertension    dr Otho Najjar      Hypothyroidism    Leg weakness, bilateral    Osteoporosis    Oxygen deficit    2l with bipap at night   RLS (restless legs syndrome)    Shortness of breath    Sleep apnea    bipap   Wheezing     Tobacco Use: Social History   Tobacco Use  Smoking Status Former   Packs/day: 1.00   Years: 10.00  Pack years: 10.00   Types: Cigarettes   Quit date: 03/20/1975   Years since quitting: 46.0  Smokeless Tobacco Never    Labs: Recent Review Flowsheet Data     Labs for ITP Cardiac and Pulmonary Rehab Latest Ref Rng & Units 07/15/2016 07/15/2016 07/17/2016 08/01/2016 11/17/2020   Trlycerides <150 mg/dL - - 142 - -   Hemoglobin A1c 4.0 - 6.0 % - - - - -   PHART 7.350 - 7.450 7.44 7.52(H) - - 7.39   PCO2ART 32.0 - 48.0 mmHg 91(HH) 71(HH) - - 55(H)   HCO3 20.0 - 28.0 mmol/L 61.8(H) 58.0(H) - 54.6(H) 33.3(H)   ACIDBASEDEF 0.0 - 2.0 mmol/L - - - - -   O2SAT % 97.2 94.9 - 67.1 90.7        Pulmonary Assessment Scores:  Pulmonary Assessment Scores     Row Name 01/10/21 1126         ADL UCSD   ADL Phase Entry     SOB Score total 11     Rest 0     Walk 1     Stairs 0  didn't complete as she stated she does not do them     Bath 0     Dress 1     Shop 1       CAT Score   CAT Score 9       mMRC Score   mMRC Score 0              UCSD: Self-administered rating of dyspnea associated with activities of daily living (ADLs) 6-point scale (0 = "not at all" to 5 = "maximal or unable to do because of breathlessness")  Scoring Scores range from 0 to 120.  Minimally important difference is 5 units  CAT: CAT can identify the health impairment of COPD patients and is better correlated with disease progression.  CAT has a scoring range of zero to 40. The CAT  score is classified into four groups of low (less than 10), medium (10 - 20), high (21-30) and very high (31-40) based on the impact level of disease on health status. A CAT score over 10 suggests significant symptoms.  A worsening CAT score could be explained by an exacerbation, poor medication adherence, poor inhaler technique, or progression of COPD or comorbid conditions.  CAT MCID is 2 points  mMRC: mMRC (Modified Medical Research Council) Dyspnea Scale is used to assess the degree of baseline functional disability in patients of respiratory disease due to dyspnea. No minimal important difference is established. A decrease in score of 1 point or greater is considered a positive change.   Pulmonary Function Assessment:  Pulmonary Function Assessment - 12/05/20 1118       Breath   Shortness of Breath Yes;Limiting activity             Exercise Target Goals: Exercise Program Goal: Individual exercise prescription set using results from initial 6 min walk test and THRR while considering  patients activity barriers and safety.   Exercise Prescription Goal: Initial exercise prescription builds to 30-45 minutes a day of aerobic activity, 2-3 days per week.  Home exercise guidelines will be given to patient during program as part of exercise prescription that the participant will acknowledge.  Education: Aerobic Exercise: - Group verbal and visual presentation on the components of exercise prescription. Introduces F.I.T.T principle from ACSM for exercise prescriptions.  Reviews F.I.T.T. principles of aerobic exercise including progression. Written material given at graduation. Flowsheet  Row Pulmonary Rehab from 02/22/2021 in [facility] Cardiac and Pulmonary Rehab  Education need identified 01/10/21       Education: Resistance Exercise: - Group verbal and visual presentation on the components of exercise prescription. Introduces F.I.T.T principle from ACSM for exercise prescriptions  Reviews  F.I.T.T. principles of resistance exercise including progression. Written material given at graduation.    Education: Exercise & Equipment Safety: - Individual verbal instruction and demonstration of equipment use and safety with use of the equipment. Flowsheet Row Pulmonary Rehab from 12/05/2020 in [facility] Card[facility] Pulmonary Rehab  Date 12/05/20  Educator [facility]  Instruction Review Code 1- Verbalizes Understanding       Education: Exercise Physiology & General Exercise Guidelines: - Group verbal and written instruction with models to review the exercise physiology of the cardiovascular system and associated critical values. Provides general exercise guidelines with specific guidelines to those with heart or lung disease.    Education: Flexibility, Balance, Mind/Body Relaxation: - Group verbal and visual presentation with interactive activity on the components of exercise prescription. Introduces F.I.T.T principle from ACSM for exercise prescriptions. Reviews F.I.T.T. principles of flexibility and balance exercise training including progression. Also discusses the mind body connection.  Reviews various relaxation techniques to help reduce and manage stress (i.e. Deep breathing, progressive muscle relaxation, and visualization). Balance handout provided to take home. Written material given at graduation. Flowsheet Row Pulmonary Rehab from 02/22/2021 in [facility] Cardiac and Pulmonary Rehab  Date 01/25/21  Educator AS  Instruction Review Code 1- Verbalizes Understanding       Activity Barriers & Risk Stratification:   6 Minute Walk:  6 Minute Walk     Row Name 01/10/21 1142         6 Minute Walk   Phase Initial     Distance 165 feet     Walk Time 0 minutes     # of Rest Breaks 0     MPH 0.31     METS 0.17     RPE 13     Perceived Dyspnea  1     VO2 Peak 0.62     Symptoms Yes (comment)     Comments Legs fatigued     Resting HR 72 bpm     Resting BP 124/78     Resting Oxygen  Saturation  95 %     Exercise Oxygen Saturation  during 6 min walk 85 %     Max Ex. HR 106 bpm     Max Ex. BP 152/76     2 Minute Post BP 130/76       Interval HR   1 Minute HR 93     2 Minute HR 97     3 Minute HR 98     4 Minute HR 106     5 Minute HR 105     6 Minute HR 100     2 Minute Post HR 75     Interval Heart Rate? Yes       Interval Oxygen   Interval Oxygen? Yes     Baseline Oxygen Saturation % 95 %     1 Minute Oxygen Saturation % 90 %     1 Minute Liters of Oxygen 0 L  RA     2 Minute Oxygen Saturation % 87 %     2 Minute Liters of Oxygen 0 L     3 Minute Oxygen Saturation % 88 %     3 Minute Liters of Oxygen  0 L     4 Minute Oxygen Saturation % 89 %     4 Minute Liters of Oxygen 0 L     5 Minute Oxygen Saturation % 89 %     5 Minute Liters of Oxygen 0 L     6 Minute Oxygen Saturation % 85 %     6 Minute Liters of Oxygen 0 L     2 Minute Post Oxygen Saturation % 93 %     2 Minute Post Liters of Oxygen 0 L             Oxygen Initial Assessment:  Oxygen Initial Assessment - 02/01/21 1341       Home Oxygen   Home Oxygen Device Home Concentrator;Portable Concentrator    Sleep Oxygen Prescription BiPAP    Liters per minute 2    Home Exercise Oxygen Prescription None    Home Resting Oxygen Prescription None    Compliance with Home Oxygen Use Yes      Initial 6 min Walk   Oxygen Used None      Program Oxygen Prescription   Program Oxygen Prescription None      Intervention   Short Term Goals To learn and exhibit compliance with exercise, home and travel O2 prescription;To learn and understand importance of monitoring SPO2 with pulse oximeter and demonstrate accurate use of the pulse oximeter.;To learn and understand importance of maintaining oxygen saturations>88%;To learn and demonstrate proper pursed lip breathing techniques or other breathing techniques.     Long  Term Goals Exhibits compliance with exercise, home  and travel O2  prescription;Verbalizes importance of monitoring SPO2 with pulse oximeter and return demonstration;Maintenance of O2 saturations>88%;Exhibits proper breathing techniques, such as pursed lip breathing or other method taught during program session             Oxygen Re-Evaluation:  Oxygen Re-Evaluation     Row Name 01/23/21 1355 02/01/21 1341 02/20/21 1355         Program Oxygen Prescription   Program Oxygen Prescription None -- None       Home Oxygen   Home Oxygen Device Home Concentrator;Portable Concentrator -- Home Concentrator;Portable Concentrator     Sleep Oxygen Prescription BiPAP -- BiPAP     Liters per minute 2 -- 2     Home Exercise Oxygen Prescription None -- None     Home Resting Oxygen Prescription None -- None     Compliance with Home Oxygen Use Yes -- Yes       Goals/Expected Outcomes   Short Term Goals To learn and exhibit compliance with exercise, home and travel O2 prescription;To learn and understand importance of monitoring SPO2 with pulse oximeter and demonstrate accurate use of the pulse oximeter.;To learn and understand importance of maintaining oxygen saturations>88%;To learn and demonstrate proper pursed lip breathing techniques or other breathing techniques.  -- To learn and exhibit compliance with exercise, home and travel O2 prescription;To learn and understand importance of monitoring SPO2 with pulse oximeter and demonstrate accurate use of the pulse oximeter.;To learn and understand importance of maintaining oxygen saturations>88%;To learn and demonstrate proper pursed lip breathing techniques or other breathing techniques.      Long  Term Goals Exhibits compliance with exercise, home  and travel O2 prescription;Verbalizes importance of monitoring SPO2 with pulse oximeter and return demonstration;Maintenance of O2 saturations>88%;Exhibits proper breathing techniques, such as pursed lip breathing or other method taught during program session -- Exhibits  compliance with exercise, home  and travel O2  prescription;Verbalizes importance of monitoring SPO2 with pulse oximeter and return demonstration;Maintenance of O2 saturations>88%;Exhibits proper breathing techniques, such as pursed lip breathing or other method taught during program session     Comments Reviewed PLB technique with pt.  Talked about how it works and it's importance in maintaining their exercise saturations. Hannah Powell uses her pulse ox at home and is aware to have her oxygen stay above 88%. She knows how to practice PLB when needed and when she feels SOB. Hannah Powell uses her pulse ox at home and is aware to have her oxygen stay above 88%. She knows how to practice PLB when needed and when she feels shortness of breath, she feels this helps.     Goals/Expected Outcomes Short: Become more profiecient at using PLB.   Long: Become independent at using PLB. Short: Continue monitoring O2 at home Long: Become independent using PLB Short: Continue monitoring O2 at home Long: Become independent using PLB              Oxygen Discharge (Final Oxygen Re-Evaluation):  Oxygen Re-Evaluation - 02/20/21 1355       Program Oxygen Prescription   Program Oxygen Prescription None      Home Oxygen   Home Oxygen Device Home Concentrator;Portable Concentrator    Sleep Oxygen Prescription BiPAP    Liters per minute 2    Home Exercise Oxygen Prescription None    Home Resting Oxygen Prescription None    Compliance with Home Oxygen Use Yes      Goals/Expected Outcomes   Short Term Goals To learn and exhibit compliance with exercise, home and travel O2 prescription;To learn and understand importance of monitoring SPO2 with pulse oximeter and demonstrate accurate use of the pulse oximeter.;To learn and understand importance of maintaining oxygen saturations>88%;To learn and demonstrate proper pursed lip breathing techniques or other breathing techniques.     Long  Term Goals Exhibits compliance with exercise, home   and travel O2 prescription;Verbalizes importance of monitoring SPO2 with pulse oximeter and return demonstration;Maintenance of O2 saturations>88%;Exhibits proper breathing techniques, such as pursed lip breathing or other method taught during program session    Comments Hannah Powell uses her pulse ox at home and is aware to have her oxygen stay above 88%. She knows how to practice PLB when needed and when she feels shortness of breath, she feels this helps.    Goals/Expected Outcomes Short: Continue monitoring O2 at home Long: Become independent using PLB             Initial Exercise Prescription:  Initial Exercise Prescription - 01/10/21 1500       Date of Initial Exercise RX and Referring Provider   Date 01/10/21    Referring Provider Ottie Glazier MD      Oxygen   Maintain Oxygen Saturation 88% or higher      Recumbant Bike   Level 1    RPM 60    Minutes 15    METs 1      NuStep   Level 1    SPM 80    Minutes 15    METs 1      REL-XR   Level 1    Speed 50    Minutes 15    METs 1      Track   Laps 2   as tolerated   Minutes 15    METs 1      Prescription Details   Frequency (times per week) 2    Duration Progress  to 30 minutes of continuous aerobic without signs/symptoms of physical distress      Intensity   THRR 40-80% of Max Heartrate 101-131    Ratings of Perceived Exertion 11-13    Perceived Dyspnea 0-4      Progression   Progression Continue to progress workloads to maintain intensity without signs/symptoms of physical distress.      Resistance Training   Training Prescription Yes    Weight 3 lb    Reps 10-15             Perform Capillary Blood Glucose checks as needed.  Exercise Prescription Changes:   Exercise Prescription Changes     Row Name 01/10/21 1500 01/31/21 1500 02/14/21 1400 02/27/21 1000       Response to Exercise   Blood Pressure (Admit) 124/78 130/72 138/76 122/76    Blood Pressure (Exercise) 152/76 -- 144/76 134/66     Blood Pressure (Exit) 132/78 134/74 126/72 120/78    Heart Rate (Admit) 72 bpm 95 bpm 103 bpm 97 bpm    Heart Rate (Exercise) 106 bpm 99 bpm 106 bpm 103 bpm    Heart Rate (Exit) 75 bpm 92 bpm 93 bpm 100 bpm    Oxygen Saturation (Admit) 95 % 90 % 91 % 93 %    Oxygen Saturation (Exercise) 85 % 92 % 85 % 90 %    Oxygen Saturation (Exit) 94 % 93 % 94 % 93 %    Rating of Perceived Exertion (Exercise) _0 Perceived Dyspnea (Exercise) 1 1 0 0    Symptoms Legs fatigued none -- none    Comments walk test results -- -- --    Duration -- Progress to 30 minutes of  aerobic without signs/symptoms of physical distress Continue with 30 min of aerobic exercise without signs/symptoms of physical distress. Continue with 30 min of aerobic exercise without signs/symptoms of physical distress.    Intensity -- THRR unchanged THRR unchanged THRR unchanged      Progression   Progression -- Continue to progress workloads to maintain intensity without signs/symptoms of physical distress. Continue to progress workloads to maintain intensity without signs/symptoms of physical distress. Continue to progress workloads to maintain intensity without signs/symptoms of physical distress.    Average METs -- 1.25 1.35 1.75      Resistance Training   Training Prescription -- Yes Yes Yes    Weight -- 3 lb 3 lb 3 lb    Reps -- 10-15 10-15 10-15      Interval Training   Interval Training -- No No No      NuStep   Level -- 1 2 --    Minutes -- 15 30 --    METs -- 1.5 1.4 --      T5 Nustep   Level -- -- -- 1    Minutes -- -- -- 15    METs -- -- -- 1.8      Biostep-RELP   Level -- 1 -- 1    Minutes -- 15 -- 15    METs -- 1 -- 1      Oxygen   Maintain Oxygen Saturation -- -- 88% or higher 88% or higher             Exercise Comments:   Exercise Comments     Row Name 01/23/21 1354           Exercise Comments First full day of exercise!  Patient was oriented to  gym and equipment including  functions, settings, policies, and procedures.  Patient's individual exercise prescription and treatment plan were reviewed.  All starting workloads were established based on the results of the 6 minute walk test done at initial orientation visit.  The plan for exercise progression was also introduced and progression will be customized based on patient's performance and goals.                Exercise Goals and Review:   Exercise Goals     Row Name 01/10/21 1515             Exercise Goals   Increase Physical Activity Yes       Intervention Provide advice, education, support and counseling about physical activity/exercise needs.;Develop an individualized exercise prescription for aerobic and resistive training based on initial evaluation findings, risk stratification, comorbidities and participant's personal goals.       Expected Outcomes Short Term: Attend rehab on a regular basis to increase amount of physical activity.;Long Term: Add in home exercise to make exercise part of routine and to increase amount of physical activity.;Long Term: Exercising regularly at least 3-5 days a week.       Increase Strength and Stamina Yes       Intervention Provide advice, education, support and counseling about physical activity/exercise needs.;Develop an individualized exercise prescription for aerobic and resistive training based on initial evaluation findings, risk stratification, comorbidities and participant's personal goals.       Expected Outcomes Short Term: Increase workloads from initial exercise prescription for resistance, speed, and METs.;Short Term: Perform resistance training exercises routinely during rehab and add in resistance training at home;Long Term: Improve cardiorespiratory fitness, muscular endurance and strength as measured by increased METs and functional capacity (6MWT)       Able to understand and use rate of perceived exertion (RPE) scale Yes       Intervention Provide  education and explanation on how to use RPE scale       Expected Outcomes Short Term: Able to use RPE daily in rehab to express subjective intensity level;Long Term:  Able to use RPE to guide intensity level when exercising independently       Able to understand and use Dyspnea scale Yes       Intervention Provide education and explanation on how to use Dyspnea scale       Expected Outcomes Short Term: Able to use Dyspnea scale daily in rehab to express subjective sense of shortness of breath during exertion;Long Term: Able to use Dyspnea scale to guide intensity level when exercising independently       Knowledge and understanding of Target Heart Rate Range (THRR) Yes       Intervention Provide education and explanation of THRR including how the numbers were predicted and where they are located for reference       Expected Outcomes Short Term: Able to state/look up THRR;Long Term: Able to use THRR to govern intensity when exercising independently;Short Term: Able to use daily as guideline for intensity in rehab       Able to check pulse independently Yes       Intervention Provide education and demonstration on how to check pulse in carotid and radial arteries.;Review the importance of being able to check your own pulse for safety during independent exercise       Expected Outcomes Short Term: Able to explain why pulse checking is important during independent exercise;Long Term: Able to check pulse independently and accurately  Understanding of Exercise Prescription Yes       Intervention Provide education, explanation, and written materials on patient's individual exercise prescription       Expected Outcomes Short Term: Able to explain program exercise prescription;Long Term: Able to explain home exercise prescription to exercise independently                Exercise Goals Re-Evaluation :  Exercise Goals Re-Evaluation     Row Name 01/23/21 1354 01/31/21 1526 02/01/21 1339 02/14/21  1406 02/20/21 1347     Exercise Goal Re-Evaluation   Exercise Goals Review Able to understand and use rate of perceived exertion (RPE) scale;Increase Physical Activity;Knowledge and understanding of Target Heart Rate Range (THRR);Understanding of Exercise Prescription;Increase Strength and Stamina;Able to understand and use Dyspnea scale;Able to check pulse independently Increase Physical Activity;Increase Strength and Stamina Increase Physical Activity;Increase Strength and Stamina Increase Physical Activity;Increase Strength and Stamina;Understanding of Exercise Prescription Increase Physical Activity;Increase Strength and Stamina;Understanding of Exercise Prescription   Comments Reviewed RPE and dyspnea scales, THR and program prescription with pt today.  Pt voiced understanding and was given a copy of goals to take home. Hannah Powell is just getting started with her exercise program.  She almost reaches her THR range.  Staff will monitor progress. EP will hold off on doing home exercise with patient until patient is further into the program. She is a member at [facility] [employer] [facility] and she plans to go there once she is more in habit of rehab with exercise. I encouraged her to keep moving and start home exercise as soon as she feels ready. We talked about starting with 1 day at home. Hannah Powell is doing well in rehab.  She is getting in her full 30 min on the NuStep at level 2!  We will conitnue to montior her progress. --   Expected Outcomes Short: Use RPE daily to regulate intensity. Long: Follow program prescription in THR. Short: attend consistently Long:  improve overall stamina Short: Go over home exercise Long: Exercise independently at home at appropriate prescription short: Improve walking more Long; Conitnue to improve stamina --    Row Name 02/20/21 1348 02/27/21 1039           Exercise Goal Re-Evaluation   Exercise Goals Review Increase Physical Activity;Increase Strength and Stamina;Understanding of  Exercise Prescription Increase Physical Activity;Increase Strength and Stamina;Understanding of Exercise Prescription      Comments Hannah Powell did not get home exercise done yet due to exercise level. Per last EP note: "She is a member at [facility] [employer] [facility] and she plans to go there once she is more in habit of rehab with exercise. I encouraged her to keep moving and start home exercise as soon as she feels ready. We talked about starting with 1 day at home." She has been more active at home and deep cleaned her house recently and she felt like she was very worn out afterwards, but reports not knowing how to pace herself. Encourged her to keep active, but not to push herself. We talked to Hannah Powell about doing PT prior to finishing rehab so that she would be able to walk more in class and move around easier.  She seemed okay with it on Wednesday when we talked with her, but she called out on Thursday saying that it really upset her.  We discussed situation with her physcian and Dr. Lanney Gins agreed that PT may be a better fit to start.  We will again discuss with Hannah Powell today when  she comes.  She is still on level 1 on all equipment and stays put for her 30 min of exercise.  She has a difficult time moving to from chair to scale and chair to equipment.      Expected Outcomes short: continue to stay active at least 1 day per week outside of rehab, EP to go over home exercise when pt is ready Long; Conitnue to improve stamina Short: Follow up with patient on PT referral Long: Build up strength for rehab.               Discharge Exercise Prescription (Final Exercise Prescription Changes):  Exercise Prescription Changes - 02/27/21 1000       Response to Exercise   Blood Pressure (Admit) 122/76    Blood Pressure (Exercise) 134/66    Blood Pressure (Exit) 120/78    Heart Rate (Admit) 97 bpm    Heart Rate (Exercise) 103 bpm    Heart Rate (Exit) 100 bpm    Oxygen Saturation (Admit) 93 %    Oxygen Saturation  (Exercise) 90 %    Oxygen Saturation (Exit) 93 %    Rating of Perceived Exertion (Exercise) 11    Perceived Dyspnea (Exercise) 0    Symptoms none    Duration Continue with 30 min of aerobic exercise without signs/symptoms of physical distress.    Intensity THRR unchanged      Progression   Progression Continue to progress workloads to maintain intensity without signs/symptoms of physical distress.    Average METs 1.75      Resistance Training   Training Prescription Yes    Weight 3 lb    Reps 10-15      Interval Training   Interval Training No      T5 Nustep   Level 1    Minutes 15    METs 1.8      Biostep-RELP   Level 1    Minutes 15    METs 1      Oxygen   Maintain Oxygen Saturation 88% or higher             Nutrition:  Target Goals: Understanding of nutrition guidelines, daily intake of sodium <1594m, cholesterol <2024m calories 30% from fat and 7% or less from saturated fats, daily to have 5 or more servings of fruits and vegetables.  Education: All About Nutrition: -Group instruction provided by verbal, written material, interactive activities, discussions, models, and posters to present general guidelines for heart healthy nutrition including fat, fiber, MyPlate, the role of sodium in heart healthy nutrition, utilization of the nutrition label, and utilization of this knowledge for meal planning. Follow up email sent as well. Written material given at graduation. [facility]  Date 02/01/21  [facility] Code 1- Verbalizes Understanding       Biometrics:    Nutrition Therapy Plan and Nutrition Goals:  Nutrition Therapy & Goals - 01/10/21 1516       Intervention Plan   Intervention Prescribe, educate and counsel regarding individualized specific dietary modifications aiming towards targeted core components such as weight, hypertension, lipid management, diabetes,  heart failure and other comorbidities.    Expected Outcomes Short Term Goal: Understand basic principles of dietary content, such as calories, fat, sodium, cholesterol and nutrients.;Short Term Goal: A plan has been developed with personal nutrition goals set during dietitian appointment.;Long Term Goal: Adherence to prescribed nutrition plan.  Nutrition Assessments:  MEDIFICTS Score Key: ?70 Need to make dietary changes  40-70 Heart Healthy Diet ? 40 Therapeutic Level Cholesterol Diet  Flowsheet Row Pulmonary Rehab from 01/10/2021 in [facility] and Pulmonary Rehab  Picture Your Plate Total Score on Admission 76      Picture Your Plate Scores: <62 Unhealthy dietary pattern with much room for improvement. 41-50 Dietary pattern unlikely to meet recommendations for good health and room for improvement. 51-60 More healthful dietary pattern, with some room for improvement.  >60 Healthy dietary pattern, although there may be some specific behaviors that could be improved.   Nutrition Goals Re-Evaluation:  Nutrition Goals Re-Evaluation     [place] Name 02/01/21 1519 02/20/21 1343           Goals   Comment Hannah Powell has not met with the RD yet. She has an appointment coming up. Nutrition appointment rescheduled for 12/12.      Expected Outcome Short: Meet with RD Long: Follow goals established by RD Short: Meet with RD Long: Follow goals established by RD               Nutrition Goals Discharge (Final Nutrition Goals Re-Evaluation):  Nutrition Goals Re-Evaluation - 02/20/21 1343       Goals   Comment Nutrition appointment rescheduled for 12/12.    Expected Outcome Short: Meet with RD Long: Follow goals established by RD             Psychosocial: Target Goals: Acknowledge presence or absence of significant depression and/or stress, maximize coping skills, provide positive support system. Participant is able to verbalize types and ability to use techniques and  skills needed for reducing stress and depression.   Education: Stress, Anxiety, and Depression - Group verbal and visual presentation to define topics covered.  Reviews how body is impacted by stress, anxiety, and depression.  Also discusses healthy ways to reduce stress and to treat/manage anxiety and depression.  Written material given at graduation.   Education: Sleep Hygiene -Provides group verbal and written instruction about how sleep can affect your health.  Define sleep hygiene, discuss sleep cycles and impact of sleep habits. Review good sleep hygiene tips.    Initial Review & Psychosocial Screening:  Initial Psych Review & Screening - 12/05/20 1119       Initial Review   Current issues with Current Sleep Concerns;Current Stress Concerns    Source of Stress Concerns Chronic Illness;Unable to participate in former interests or hobbies    Comments Landra states that she is not depressed but is having a hard time coping with her legs being weak. She can look to her husband, two children and grand children that she can depend on.      Family Dynamics   Good Support System? Yes      Barriers   Psychosocial barriers to participate in program The patient should benefit from training in stress management and relaxation.      Screening Interventions   Interventions To provide support and resources with identified psychosocial needs;Provide feedback about the scores to participant;Encouraged to exercise    Expected Outcomes Short Term goal: Utilizing psychosocial counselor, staff and physician to assist with identification of specific Stressors or current issues interfering with healing process. Setting desired goal for each stressor or current issue identified.;Long Term Goal: Stressors or current issues are controlled or eliminated.;Short Term goal: Identification and review with participant of any Quality of Life or Depression concerns found by scoring the questionnaire.;Long Term goal:  The participant improves quality of Life and PHQ9 Scores as seen by post scores and/or verbalization of changes             Quality of Life Scores:  Scores of 19 and below usually indicate a poorer quality of life in these areas.  A difference of  2-3 points is a clinically meaningful difference.  A difference of 2-3 points in the total score of the Quality of Life Index has been associated with significant improvement in overall quality of life, self-image, physical symptoms, and general health in studies assessing change in quality of life.  PHQ-9: Recent Review Flowsheet Data     Depression screen [facility] 2/9 01/10/2021 10/01/2016 08/09/2016   Decreased Interest 0 0 0   Down, Depressed, Hopeless 0 0 0   PHQ - 2 Score 0 0 0   Altered sleeping 1 - -   Tired, decreased energy 1 - -   Change in appetite 0 - -   Feeling bad or failure about yourself  1 - -   Trouble concentrating 0 - -   Moving slowly or fidgety/restless 0 - -   Suicidal thoughts 0 - -   PHQ-9 Score 3 - -      Interpretation of Total Score  Total Score Depression Severity:  1-4 = Minimal depression, 5-9 = Mild depression, 10-14 = Moderate depression, 15-19 = Moderately severe depression, 20-27 = Severe depression   Psychosocial Evaluation and Intervention:  Psychosocial Evaluation - 12/05/20 1122       Psychosocial Evaluation & Interventions   Interventions Encouraged to exercise with the program and follow exercise prescription;Relaxation education;Stress management education    Comments Hodan states that she is not depressed but is having a hard time coping with her legs being weak. She can look to her husband, two children and grand children that she can depend on.    Expected Outcomes Short: Start [facility] to help with mood. Long: Maintain a healthy mental state.    Continue Psychosocial Services  Follow up required by staff             Psychosocial Re-Evaluation:  Psychosocial Re-Evaluation     [place]  Name 02/01/21 5916 02/20/21 1352           Psychosocial Re-Evaluation   Current issues with Current Stress Concerns Current Stress Concerns      Comments Hannah Powell is holding up well mentally. Her leg weakness and arthritis are her biggest problems that limits her from moving and is frustrating for her. She is not traveling to her family in [place] due to her medical problems and is a little upset by it.  She is enjoying [facility] thus far and is thinking it'll help her tremendously. She takes Alprazolam is as needed and has not needed to take it in while. She reports no major stressors, but it causes her stress to not be able to do the things she used to do due to her legs - it is hard for her to get around. She gets frustrated with how long it takes her to do things. She enjoys to read ([place] [employer] [address]), she is also doing a bible study. She relies on her husband and children as support for her. She reports sleeping well and very rarely has a sleepless night.      Expected Outcomes Short: Continue attendance with rehab Long: Continue to maintain posititive attitude and utilize exercise for stress management Short: Continue attendance with rehab Long: Continue to  maintain posititive attitude and utilize exercise for stress management      Interventions Encouraged to attend Pulmonary Rehabilitation for the exercise Encouraged to attend Pulmonary Rehabilitation for the exercise      Continue Psychosocial Services  Follow up required by staff Follow up required by staff        Initial Review   Source of Stress Concerns -- Chronic Illness;Unable to participate in former interests or hobbies               Psychosocial Discharge (Final Psychosocial Re-Evaluation):  Psychosocial Re-Evaluation - 02/20/21 1352       Psychosocial Re-Evaluation   Current issues with Current Stress Concerns    Comments She reports no major stressors, but it causes her stress to not be able to do the things she used  to do due to her legs - it is hard for her to get around. She gets frustrated with how long it takes her to do things. She enjoys to read ([place] [employer] [address]), she is also doing a bible study. She relies on her husband and children as support for her. She reports sleeping well and very rarely has a sleepless night.    Expected Outcomes Short: Continue attendance with rehab Long: Continue to maintain posititive attitude and utilize exercise for stress management    Interventions Encouraged to attend Pulmonary Rehabilitation for the exercise    Continue Psychosocial Services  Follow up required by staff      Initial Review   Source of Stress Concerns Chronic Illness;Unable to participate in former interests or hobbies             Education: Education Goals: Education classes will be provided on a weekly basis, covering required topics. Participant will state understanding/return demonstration of topics presented.  Learning Barriers/Preferences:  Learning Barriers/Preferences - 12/05/20 1118       Learning Barriers/Preferences   Learning Barriers None    Learning Preferences None             General Pulmonary Education Topics:  Infection Prevention: - Provides verbal and written material to individual with discussion of infection control including proper hand washing and proper equipment cleaning during exercise session. Flowsheet Row Pulmonary Rehab from 12/05/2020 in [facility] Cardiac and Pulmonary Rehab  Date 12/05/20  Educator [facility]  Instruction Review Code 1- Verbalizes Understanding       Falls Prevention: - Provides verbal and written material to individual with discussion of falls prevention and safety. Flowsheet Row Pulmonary Rehab [facility] 12[facility]05/2020 [facility] Card[facility] and Pulmonary Rehab  Date 12/05/20  Educator [facility]  Instruction Review Code 1- Verbalizes Understanding       Chronic Lung Disease Review: - Group verbal instruction with posters, models, PowerPoint  presentations and videos,  to review new updates, new respiratory medications, new advancements in procedures and treatments. Providing information on websites and "800" numbers for continued self-education. Includes information about supplement oxygen, available portable oxygen systems, continuous and intermittent flow rates, oxygen safety, concentrators, and Medicare reimbursement for oxygen. Explanation of Pulmonary Drugs, including class, frequency, complications, importance of spacers, rinsing mouth after steroid MDI's, and proper cleaning methods for nebulizers. Review of basic lung anatomy and physiology related to function, structure, and complications of lung disease. Review of risk factors. Discussion about methods for diagnosing sleep apnea and types of masks and machines for OSA. Includes a review of the use of types of environmental controls: home humidity, furnaces, filters, dust mite/pet prevention, HEPA vacuums. Discussion about weather  changes, air quality and the benefits of nasal washing. Instruction on Warning signs, infection symptoms, calling MD promptly, preventive modes, and value of vaccinations. Review of effective airway clearance, coughing and/or vibration techniques. Emphasizing that all should Create an Action Plan. Written material given at graduation. Flowsheet Row Pulmonary Rehab from 02/22/2021 in [facility] Cardiac and Pulmonary Rehab  Education need identified 01/10/21  Date 02/22/21  Educator [facility]  Instruction Review Code 1- Verbalizes Understanding       AED/CPR: - Group verbal and written instruction with the use of models to demonstrate the basic use of the AED with the basic ABC's of resuscitation.    Anatomy and Cardiac Procedures: - Group verbal and visual presentation and models provide information about basic cardiac anatomy and function. Reviews the testing methods done to diagnose heart disease and the outcomes of the test results. Describes the treatment  choices: Medical Management, Angioplasty, or Coronary Bypass Surgery for treating various heart conditions including Myocardial Infarction, Angina, Valve Disease, and Cardiac Arrhythmias.  Written material given at graduation.   Medication Safety: - Group verbal and visual instruction to review commonly prescribed medications for heart and lung disease. Reviews the medication, class of the drug, and side effects. Includes the steps to properly store meds and maintain the prescription regimen.  Written material given at graduation.   Other: -Provides group and verbal instruction on various topics (see comments)   Knowledge Questionnaire Score:  Knowledge Questionnaire Score - 01/10/21 1122       Knowledge Questionnaire Score   Pre Score 16/18: Exercise, O2              Core Components/Risk Factors/Patient Goals at Admission:  Personal Goals and Risk Factors at Admission - 01/10/21 1516       Core Components/Risk Factors/Patient Goals on Admission    Weight Management Yes;Weight Loss    Intervention Weight Management: Develop a combined nutrition and exercise program designed to reach desired caloric intake, while maintaining appropriate intake of nutrient and fiber, sodium and fats, and appropriate energy expenditure required for the weight goal.;Weight Management: Provide education and appropriate resources to help participant work on and attain dietary goals.;Weight Management/Obesity: Establish reasonable short term and long term weight goals.    Admit Weight 232 lb (105.2 kg)    Goal Weight: Short Term 227 lb (103 kg)    Goal Weight: Long Term 200 lb (90.7 kg)    Expected Outcomes Short Term: Continue to assess and modify interventions until short term weight is achieved;Long Term: Adherence to nutrition and physical activity/exercise program aimed toward attainment of established weight goal;Weight Loss: Understanding of general recommendations for a balanced deficit meal plan,  which promotes 1-2 lb weight loss per week and includes a negative energy balance of [id number] kcal/d;Understanding recommendations for meals to include 15-35% energy as protein, 25-35% energy from fat, 35-60% energy from carbohydrates, less than 235m of dietary cholesterol, 20-35 gm of total fiber daily;Understanding of distribution of calorie intake throughout the day with the consumption of 4-5 meals/snacks    Improve shortness of breath with ADL's Yes    Intervention Provide education, individualized exercise plan and daily activity instruction to help decrease symptoms of SOB with activities of daily living.    Expected Outcomes Short Term: Improve cardiorespiratory fitness to achieve a reduction of symptoms when performing ADLs;Long Term: Be able to perform more ADLs without symptoms or delay the onset of symptoms    Heart Failure Yes    Intervention Provide a combined exercise  and nutrition program that is supplemented with education, support and counseling about heart failure. Directed toward relieving symptoms such as shortness of breath, decreased exercise tolerance, and extremity edema.    Expected Outcomes Improve functional capacity of life;Short term: Attendance in program 2-3 days a week with increased exercise capacity. Reported lower sodium intake. Reported increased fruit and vegetable intake. Reports medication compliance.;Short term: Daily weights obtained and reported for increase. Utilizing diuretic protocols set by physician.;Long term: Adoption of self-care skills and reduction of barriers for early signs and symptoms recognition and intervention leading to self-care maintenance.    Hypertension Yes    Intervention Provide education on lifestyle modifcations including regular physical activity/exercise, weight management, moderate sodium restriction and increased consumption of fresh fruit, vegetables, and low fat dairy, alcohol moderation, and smoking cessation.;Monitor prescription  use compliance.    Expected Outcomes Short Term: Continued assessment and intervention until BP is < 140/71m HG in hypertensive participants. < 130/850mHG in hypertensive participants with diabetes, heart failure or chronic kidney disease.;Long Term: Maintenance of blood pressure at goal levels.             Education:Diabetes - Individual verbal and written instruction to review signs/symptoms of diabetes, desired ranges of glucose level fasting, after meals and with exercise. Acknowledge that pre and post exercise glucose checks will be done for 3 sessions at entry of program.   Know Your Numbers and Heart Failure: - Group verbal and visual instruction to discuss disease risk factors for cardiac and pulmonary disease and treatment options.  Reviews associated critical values for Overweight/Obesity, Hypertension, Cholesterol, and Diabetes.  Discusses basics of heart failure: signs/symptoms and treatments.  Introduces Heart Failure Zone chart for action plan for heart failure.  Written material given at graduation. [facility]  Date 02/15/21  [facility] Review Code 1- Verbalizes Understanding       Core Components/Risk Factors/Patient Goals Review:   Goals and Risk Factor Review     Row Name 02/01/21 1345 02/20/21 1344           Core Components/Risk Factors/Patient Goals Review   Personal Goals Review Hypertension;Weight Management/Obesity;Heart Failure Hypertension;Weight Management/Obesity;Heart Failure      Review ToVivien Rotas focusing more on portion control, drinking more water, and reducing her snacks to help her lose weight which is still a goal of hers. ToVivien Rotas weighing herself at home daily and is aware to note if she gains a lot of weight in a short period of time. Denies any heart failure symptoms at this time. She checks her BP at home and is running 12923-300Tystolic and 7062-26Jiastolic. Her  BPs at rehab are good as well. ToVivien Rotaas not met with RD yet, new appointment scheduled for 12/12. ToVivien Rotas still weighing herself daily reporting no major changes - reminded to take note of major weight changes in short period of time. She continues to check her BP at home when she remembers, every couple of weeks. Her BP today was 138/78.      Expected Outcomes Short: Continue working towards weight loss Long: Manage life style risk factors ST: check BP regularly at home (when not at rehab) LT: manage lifestyle risk factors               Core Components/Risk Factors/Patient Goals at Discharge (Final Review):   Goals and Risk Factor Review - 02/20/21 1344       Core Components/Risk Factors/Patient  Goals Review   Personal Goals Review Hypertension;Weight Management/Obesity;Heart Failure    Review Hannah Powell has not met with RD yet, new appointment scheduled for 12/12. Hannah Powell is still weighing herself daily reporting no major changes - reminded to take note of major weight changes in short period of time. She continues to check her BP at home when she remembers, every couple of weeks. Her BP today was 138/78.    Expected Outcomes ST: check BP regularly at home (when not at rehab) LT: manage lifestyle risk factors             ITP Comments:  ITP Comments     Row Name 12/05/20 1115 12/05/20 1125 01/10/21 1114 01/18/21 0648 01/23/21 1354   ITP Comments Virtual Visit completed. Patient informed on EP and RD appointment and 6 Minute walk test. Patient also informed of patient health questionnaires on My Chart. Patient Verbalizes understanding. Visit diagnosis can be found in [facility] 11/18/2020. Patient is to call back when she gets out of her boot. She had broke her ankle from a fall and will call back in two weeks for clearance to exercise. Completed 6MWT and gym orientation. Initial ITP created and sent for review to Dr. Ottie Glazier, Medical Director. 30 Day review completed. Medical Director ITP review  done, changes made as directed, and signed approval by Medical Director.   New to program First full day of exercise!  Patient was oriented to gym and equipment including functions, settings, policies, and procedures.  Patient's individual exercise prescription and treatment plan were reviewed.  All starting workloads were established based on the results of the 6 minute walk test done at initial orientation visit.  The plan for exercise progression was also introduced and progression will be customized based on patient's performance and goals.    [place] Name 02/15/21 0641 03/06/21 1421 03/15/21 0701 04/04/21 1533 04/12/21 0836   ITP Comments 30 Day review completed. Medical Director ITP review done, changes made as directed, and signed approval by Medical Director.    New to program Hannah Powell is now on medical hold for PT to improve strength and safety before finishing her rehab. 30 Day review completed. Medical Director ITP review done, changes made as directed, and signed approval by Medical Director. Hannah Powell is still working with PT and making good progress.  We will continue to keep her out on hold until her f/u with Dr. Lanney Gins next month. 30 Day review completed. Medical Director ITP review done, changes made as directed, and signed approval by Medical Director.   out for medical reasons            Comments:

## 2021-04-13 ENCOUNTER — Other Ambulatory Visit: Payer: Self-pay

## 2021-04-13 ENCOUNTER — Ambulatory Visit: Payer: Medicare HMO | Admitting: Physical Therapy

## 2021-04-13 ENCOUNTER — Encounter: Payer: Self-pay | Admitting: Physical Therapy

## 2021-04-13 DIAGNOSIS — R269 Unspecified abnormalities of gait and mobility: Secondary | ICD-10-CM | POA: Diagnosis not present

## 2021-04-13 DIAGNOSIS — M6281 Muscle weakness (generalized): Secondary | ICD-10-CM

## 2021-04-13 DIAGNOSIS — R2689 Other abnormalities of gait and mobility: Secondary | ICD-10-CM

## 2021-04-13 DIAGNOSIS — R293 Abnormal posture: Secondary | ICD-10-CM

## 2021-04-13 NOTE — Therapy (Signed)
[facility] [address] [place], [name], [address] Phone: [phone]   Fax:  [phone]  Physical Therapy Treatment  Patient Details  Name: Hannah Powell MRN: [id number] Date of Birth: [birth year] Referring Provider (PT): Dr. Ottie Glazier   Encounter Date: 04/13/2021   PT End of Session - 04/15/21 1258     Visit Number 12    Number of Visits 16    Date for PT Re-Evaluation 04/26/21    Authorization - Visit Number 2    Authorization - Number of Visits 10    PT Start Time 0905    PT Stop Time 0950    PT Time Calculation (min) 45 min    Equipment Utilized During Treatment Gait belt    Activity Tolerance Patient tolerated treatment well;Patient limited by fatigue    Behavior During Therapy WFL for tasks assessed/performed             Past Medical History:  Diagnosis Date   Anxiety    Arthritis    CHF (congestive heart failure) (HCC)    COPD (chronic obstructive pulmonary disease) ([place])    Dysplastic nevus 04/22/2019   R abdomen parallel to sup umbilicus - mod   Dysplastic nevus 04/22/2019   R low back above waistline - mild    Edema    feet/legs   Hypertension    dr Otho Najjar      Hypothyroidism    Leg weakness, bilateral    Osteoporosis    Oxygen deficit    2l with bipap at night   RLS (restless legs syndrome)    Shortness of breath    Sleep apnea    bipap   Wheezing     Past Surgical History:  Procedure Laterality Date   BACK SURGERY     cervical   BREAST BIOPSY Left    needle bx-neg   CATARACT EXTRACTION W/PHACO Left 05/07/2017   Procedure: CATARACT EXTRACTION PHACO AND INTRAOCULAR LENS PLACEMENT ([place]);  Surgeon: Birder Robson, MD;  Location: [facility];  Service: Ophthalmology;  Laterality: Left;  [place] 00:48.7 AP% 11.3 CDE 5.46 Fluid Pack Lot # [id number] H   CATARACT EXTRACTION W/PHACO Right 04/23/2017   Procedure: CATARACT EXTRACTION PHACO AND INTRAOCULAR LENS PLACEMENT ([facility]);  Surgeon:  Birder Robson, MD;  Location: [facility];  Service: Ophthalmology;  Laterality: Right;  [place] 00:29 AP% 12.8 CDE 3.79 FLUID PACK LOT # [id number]   FOOT ARTHROPLASTY     JOINT REPLACEMENT     x2 tkr   TOTAL SHOULDER ARTHROPLASTY Left 07/02/2013   Procedure: LEFT TOTAL SHOULDER ARTHROPLASTY;  Surgeon: Marin Shutter, MD;  Location: [facility];  Service: Orthopedics;  Laterality: Left;   TOTAL SHOULDER ARTHROPLASTY Right 04/10/2018   Procedure: TOTAL REVERSE SHOULDER ARTHROPLASTY;  Surgeon: Justice Britain, MD;  Location: [facility];  Service: Orthopedics;  Laterality: Right;  15mn    There were no vitals filed for this visit.   Subjective Assessment - 04/15/21 1254     Subjective Pt. cancelled last PT appt. due to not feeling well.  Pt. entered PT today with use of RW and slow, forward posture/ gait pattern.  Pt. returns to MD for f/u next week.    Pertinent History extensive PMH.  Pt. was participating with Pulmonary Rehab and pt. states that she is hoping to return to Pulmonary Rehab when appropriate.  Pt. states they were concerned about her LE strength/ balance getting on/off equipment.    Limitations Standing;Walking;House hold activities  Patient Stated Goals Increase B LE muscle strength/ return to Pulmonary Rehab    Currently in Pain? Yes    Pain Score 2     Pain Location Back    Pain Orientation Right;Lower                Ther.ex.:   Nustep L4 Seat 10 for 10 min. With B UE/LE.    Seated/ standing ex.: LAQ/ marching/ standing hip abduction/ flexion/ heel and toe raises 20x in //-bars for safety.     Walking in //-bars: marching/ lateral walking/ backwards walking 2 laps each.  Cuing for posture.     Step lunges from chair in //-bars (poor L LE wt. Bearing/ fearful of L knee buckling).   2# wand ex.: sh. Flexion/ chest press 20#.       Neuro:   Sit to stands from gray chair with Airex mat: (5x)- fatigue.  Requires use of at least 1 UE to stand safely.       Seated  posture correction: added shoulder flexion/ abduction with breathing.     Walking in clinic/ outside to car with use of RW.        PT Long Term Goals - 04/04/21 1201       PT LONG TERM GOAL #1   Title Pt. will improve 5XSTS to <15 sec to improve functional mobility.    Baseline >20 sec.    Time 8    Period Weeks    Status Partially Met    Target Date 04/26/21      PT LONG TERM GOAL #2   Title Pt. will increase B LE muscle strength 1/2 muscle grade to improve safety/ independence with functional mobility.    Baseline neurogenic claudication. Pt. presents with good strength in bilat. hip add and abd, bilat DF, and quadriceps. Pt. has 4/5 strength in B hip flex, 4+/5 in R knee flex, and R inversion. Pt. has 4/5 strength with L knee flex, R eversion, and L inverison.    Time 8    Period Weeks    Status Partially Met    Target Date 04/26/21      PT LONG TERM GOAL #3   Title Pt. will demonstrate improved normal gait speed to 0.4 m/s to decrease falls risk and improve ability to safely walk in community.    Baseline TBD    Time 8    Period Weeks    Status On-going    Target Date 04/26/21      PT LONG TERM GOAL #4   Title Pt. will demonstrate safety with ascending 4 stairs with at most one handrail to improve community ambulation.    Baseline Pt able to ascend/ descend stairs with both handrails and lateral step to pattern.    Time 8    Period Weeks    Status Partially Met    Target Date 04/26/21                   Plan - 04/15/21 1259     Clinical Impression Statement Moderate generalized/ LE muscle fatigue during transfers and standing ther.ex.  Pt. continues to remain limited with trust of L LE/knee during stance phase of standing ther.ex./ steps.  Pt. requires seated rest breaks between standing ther.ex. and walking tasks in //-bars due to fatigue.  Pts. O2 sats remain >92% during ex./ Nustep.  No changes to HEP and pt. will continue to attend gym for use of bike and  plan to  return to aquatic based ex.    Personal Factors and Comorbidities Age;Comorbidity 3+    Comorbidities CPD, HTN, Obesity    Stability/Clinical Decision Making Evolving/Moderate complexity    Clinical Decision Making Moderate    Rehab Potential Fair    PT Frequency 2x / week    PT Duration 8 weeks    PT Treatment/Interventions Cryotherapy;Electrical Stimulation;Ultrasound;Moist Heat;Gait training;Functional mobility training;Therapeutic activities;Therapeutic exercise;Neuromuscular re-education;Scar mobilization;Balance training;Stair training;Patient/family education;Manual techniques;ADLs/Self Care Home Management    PT Next Visit Plan Dynamic balance tasks/ outside walking on ramp/ curb.    Consulted and Agree with Plan of Care Patient             Patient will benefit from skilled therapeutic intervention in order to improve the following deficits and impairments:  Decreased endurance, Decreased range of motion, Decreased skin integrity, Decreased strength, Hypomobility, Impaired UE functional use, Increased fascial restricitons, Pain, Postural dysfunction, Impaired flexibility, Decreased scar mobility, Decreased mobility, Decreased balance, Improper body mechanics, Abnormal gait, Decreased activity tolerance, Impaired sensation  Visit Diagnosis: Muscle weakness (generalized)  Gait difficulty  Abnormal posture  Imbalance     Problem List Patient Active Problem List   Diagnosis Date Noted   S/P reverse total shoulder arthroplasty, 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   Chronic diastolic heart failure ([place]) 08/09/2016   COPD (chronic obstructive pulmonary disease) ([place]) 08/09/2016   Pulmonary infiltrates    Pneumonia due to Streptococcus pneumoniae ([place])    Fever    Community acquired pneumonia of right lung    Palliative care by specialist    Goals of care, counseling/discussion    Pressure injury of skin 07/03/2016   COPD exacerbation ([place]) 07/21/2015   Acute renal insufficiency  07/21/2015   Hyperglycemia 07/21/2015   Benign essential HTN 07/21/2015   S/P shoulder replacement 07/02/2013   Pura Spice, PT, DPT # [phone] 04/15/2021, 1:04 PM   [facility] [address] [place], [name], [address] Phone: [phone]   Fax:  [phone]  Name: Ariauna Farabee MRN: [id number] Date of Birth: 09-15-1945

## 2021-04-18 ENCOUNTER — Ambulatory Visit: Payer: Medicare HMO | Admitting: Physical Therapy

## 2021-04-18 ENCOUNTER — Encounter: Payer: Self-pay | Admitting: Physical Therapy

## 2021-04-18 ENCOUNTER — Other Ambulatory Visit: Payer: Self-pay

## 2021-04-18 DIAGNOSIS — R293 Abnormal posture: Secondary | ICD-10-CM | POA: Diagnosis not present

## 2021-04-18 DIAGNOSIS — R2689 Other abnormalities of gait and mobility: Secondary | ICD-10-CM

## 2021-04-18 DIAGNOSIS — R269 Unspecified abnormalities of gait and mobility: Secondary | ICD-10-CM

## 2021-04-18 DIAGNOSIS — M6281 Muscle weakness (generalized): Secondary | ICD-10-CM | POA: Diagnosis not present

## 2021-04-18 NOTE — Therapy (Signed)
[facility] [address] [place], [name], [address] Phone: [phone]   Fax:  [phone]  Physical Therapy Treatment  Patient Details  Name: Hannah Powell MRN: [id number] Date of Birth: 07-Apr-1945 Referring Provider (PT): Dr. Ottie Glazier   Encounter Date: 04/18/2021   PT End of Session - 04/18/21 1009     Visit Number 13    Number of Visits 16    Date for PT Re-Evaluation 04/26/21    Authorization - Visit Number 3    Authorization - Number of Visits 10    PT Start Time 4268    PT Stop Time 0947    PT Time Calculation (min) 50 min    Equipment Utilized During Treatment Gait belt    Activity Tolerance Patient tolerated treatment well;Patient limited by fatigue    Behavior During Therapy WFL for tasks assessed/performed             Past Medical History:  Diagnosis Date   Anxiety    Arthritis    CHF (congestive heart failure) (HCC)    COPD (chronic obstructive pulmonary disease) ([place])    Dysplastic nevus 04/22/2019   R abdomen parallel to sup umbilicus - mod   Dysplastic nevus 04/22/2019   R low back above waistline - mild    Edema    feet/legs   Hypertension    dr Otho Najjar      Hypothyroidism    Leg weakness, bilateral    Osteoporosis    Oxygen deficit    2l with bipap at night   RLS (restless legs syndrome)    Shortness of breath    Sleep apnea    bipap   Wheezing     Past Surgical History:  Procedure Laterality Date   BACK SURGERY     cervical   BREAST BIOPSY Left    needle bx-neg   CATARACT EXTRACTION W/PHACO Left 05/07/2017   Procedure: CATARACT EXTRACTION PHACO AND INTRAOCULAR LENS PLACEMENT ([place]);  Surgeon: Birder Robson, MD;  Location: [facility];  Service: Ophthalmology;  Laterality: Left;  [place] 00:48.7 AP% 11.3 CDE 5.46 Fluid Pack Lot # [id number] H   CATARACT EXTRACTION W/PHACO Right 04/23/2017   Procedure: CATARACT EXTRACTION PHACO AND INTRAOCULAR LENS PLACEMENT ([facility]);  Surgeon:  Birder Robson, MD;  Location: [facility];  Service: Ophthalmology;  Laterality: Right;  [place] 00:29 AP% 12.8 CDE 3.79 FLUID PACK LOT # [id number] H   FOOT ARTHROPLASTY     JOINT REPLACEMENT     x2 tkr   TOTAL SHOULDER ARTHROPLASTY Left 07/02/2013   Procedure: LEFT TOTAL SHOULDER ARTHROPLASTY;  Surgeon: Marin Shutter, MD;  Location: [facility];  Service: Orthopedics;  Laterality: Left;   TOTAL SHOULDER ARTHROPLASTY Right 04/10/2018   Procedure: TOTAL REVERSE SHOULDER ARTHROPLASTY;  Surgeon: Justice Britain, MD;  [facility];  Service: Orthopedics;  Laterality: Right;  134mn    There were no vitals filed for this visit.   Subjective Assessment - 04/18/21 1008     Subjective Pt. reports no new complaints with pain.  Pt. [facility]  PT reviewed sit to stands and proper technique a blue mat table.    Pertinent History extensive PMH.  Pt. was participating with Pulmonary Rehab and pt. states that she is hoping to return to Pulmonary Rehab when appropriate.  Pt. states they were concerned about her LE strength/ balance getting on/off equipment.    Limitations Standing;Walking;House hold activities  Patient Stated Goals Increase B LE muscle strength/ return to Pulmonary Rehab    Currently in Pain? No/denies                Ther.ex.:   Nustep L4 Seat 10 for 10 min. With B UE/LE.    Seated/ standing ex.: LAQ/ marching/ hip flexion/ heel and toe raises 20x in //-bars for safety.   Walking in //-bars: marching/ lateral walking/ backwards walking 2 laps each.  Cuing for posture.  Pt. Requires frequent seated rest breaks.     2# wand ex.: sh. Flexion/ bicep curls/ chest press 20#.       Neuro:   6" step touches (alternate).    Sit to stands from blue mat table: (5x)- fatigue.  Requires multiple attempts for 5th stand due to LE muscle fatigue.     Seated posture correction: added shoulder flexion/ abduction with breathing.      Walking in clinic/ outside to car with use of RW.        PT Long Term Goals - 04/04/21 1201       PT LONG TERM GOAL #1   Title Pt. will improve 5XSTS to <15 sec to improve functional mobility.    Baseline >20 sec.    Time 8    Period Weeks    Status Partially Met    Target Date 04/26/21      PT LONG TERM GOAL #2   Title Pt. will increase B LE muscle strength 1/2 muscle grade to improve safety/ independence with functional mobility.    Baseline neurogenic claudication. Pt. presents with good strength in bilat. hip add and abd, bilat DF, and quadriceps. Pt. has 4/5 strength in B hip flex, 4+/5 in R knee flex, and R inversion. Pt. has 4/5 strength with L knee flex, R eversion, and L inverison.    Time 8    Period Weeks    Status Partially Met    Target Date 04/26/21      PT LONG TERM GOAL #3   Title Pt. will demonstrate improved normal gait speed to 0.4 m/s to decrease falls risk and improve ability to safely walk in community.    Baseline TBD    Time 8    Period Weeks    Status On-going    Target Date 04/26/21      PT LONG TERM GOAL #4   Title Pt. will demonstrate safety with ascending 4 stairs with at most one handrail to improve community ambulation.    Baseline Pt able to ascend/ descend stairs with both handrails and lateral step to pattern.    Time 8    Period Weeks    Status Partially Met    Target Date 04/26/21                   Plan - 04/18/21 1045     Clinical Impression Statement PT assessed pts. O2 sats. t/o tx. session and pts. lowest sat. rates were 87% but pt. returned to 94% in less than 60 seconds.  PT cued pt. with breathing during all standing and UE resisted ther.ex.  Moderate cuing to correct head/ shoulder upright posture during //-bar walking forward/ backwards/ lateral.  No LOB but increase caution with L LE wt. bearing during all standing/walking tasks.  Extra time with sit to stands from varying heights on blue mat table.    Personal  Factors and Comorbidities Age;Comorbidity 3+    Comorbidities CPD, HTN, Obesity  Stability/Clinical Decision Making Evolving/Moderate complexity    Clinical Decision Making Moderate    Rehab Potential Fair    PT Frequency 2x / week    PT Duration 8 weeks    PT Treatment/Interventions Cryotherapy;Electrical Stimulation;Ultrasound;Moist Heat;Gait training;Functional mobility training;Therapeutic activities;Therapeutic exercise;Neuromuscular re-education;Scar mobilization;Balance training;Stair training;Patient/family education;Manual techniques;ADLs/Self Care Home Management    PT Next Visit Plan Dynamic balance tasks/ outside walking on ramp/ curb.    Consulted and Agree with Plan of Care Patient             Patient will benefit from skilled therapeutic intervention in order to improve the following deficits and impairments:  Decreased endurance, Decreased range of motion, Decreased skin integrity, Decreased strength, Hypomobility, Impaired UE functional use, Increased fascial restricitons, Pain, Postural dysfunction, Impaired flexibility, Decreased scar mobility, Decreased mobility, Decreased balance, Improper body mechanics, Abnormal gait, Decreased activity tolerance, Impaired sensation  Visit Diagnosis: Muscle weakness (generalized)  Gait difficulty  Abnormal posture  Imbalance     Problem List Patient Active Problem List   Diagnosis Date Noted   S/P reverse total shoulder arthroplasty, 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   Chronic diastolic heart failure ([place]) 08/09/2016   COPD (chronic obstructive pulmonary disease) ([place]) 08/09/2016   Pulmonary infiltrates    Pneumonia due to Streptococcus pneumoniae ([place])    Fever    Community acquired pneumonia of right lung    Palliative care by specialist    Goals of care, counseling/discussion    Pressure injury of skin 07/03/2016   COPD exacerbation ([place]) 07/21/2015   Acute renal insufficiency 07/21/2015   Hyperglycemia 07/21/2015   Benign  essential HTN 07/21/2015   S/P shoulder replacement 07/02/2013   Pura Spice, PT, DPT # [id number] 04/18/2021, 10:56 AM  [facility] [address] [place], [name], [address] Phone: [phone]   Fax:  [phone]  Name: Hannah Powell MRN: [id number] Date of Birth: [birth year]

## 2021-04-20 DIAGNOSIS — J449 Chronic obstructive pulmonary disease, unspecified: Secondary | ICD-10-CM | POA: Diagnosis not present

## 2021-04-24 ENCOUNTER — Ambulatory Visit: Payer: Medicare HMO | Attending: Pulmonary Disease | Admitting: Physical Therapy

## 2021-04-24 ENCOUNTER — Encounter: Payer: Self-pay | Admitting: Physical Therapy

## 2021-04-24 ENCOUNTER — Other Ambulatory Visit: Payer: Self-pay

## 2021-04-24 DIAGNOSIS — R2689 Other abnormalities of gait and mobility: Secondary | ICD-10-CM

## 2021-04-24 DIAGNOSIS — R269 Unspecified abnormalities of gait and mobility: Secondary | ICD-10-CM

## 2021-04-24 DIAGNOSIS — M6281 Muscle weakness (generalized): Secondary | ICD-10-CM

## 2021-04-24 DIAGNOSIS — R293 Abnormal posture: Secondary | ICD-10-CM

## 2021-04-24 NOTE — Therapy (Signed)
[facility] [address] [place], [name], [address] Phone: [phone]   Fax:  [phone]  Physical Therapy Treatment  Patient Details  Name: Hannah Powell MRN: [id number] Date of Birth: [birth year] Referring Provider (PT): Dr. Ottie Glazier   Encounter Date: 04/24/2021   PT End of Session - 04/24/21 1228     Visit Number 13    Number of Visits 16    Date for PT Re-Evaluation 04/26/21    Authorization - Visit Number 3    Authorization - Number of Visits 10    PT Start Time 2353    PT Stop Time 1140    PT Time Calculation (min) 51 min    Equipment Utilized During Treatment Gait belt    Activity Tolerance Patient tolerated treatment well;Patient limited by fatigue    Behavior During Therapy WFL for tasks assessed/performed             Past Medical History:  Diagnosis Date   Anxiety    Arthritis    CHF (congestive heart failure) (HCC)    COPD (chronic obstructive pulmonary disease) ([place])    Dysplastic nevus 04/22/2019   R abdomen parallel to sup umbilicus - mod   Dysplastic nevus 04/22/2019   R low back above waistline - mild    Edema    feet/legs   Hypertension    dr Otho Najjar      Hypothyroidism    Leg weakness, bilateral    Osteoporosis    Oxygen deficit    2l with bipap at night   RLS (restless legs syndrome)    Shortness of breath    Sleep apnea    bipap   Wheezing     Past Surgical History:  Procedure Laterality Date   BACK SURGERY     cervical   BREAST BIOPSY Left    needle bx-neg   CATARACT EXTRACTION W/PHACO Left 05/07/2017   Procedure: CATARACT EXTRACTION PHACO AND INTRAOCULAR LENS PLACEMENT ([place]);  Surgeon: Birder Robson, MD;  Location: [facility];  Service: Ophthalmology;  Laterality: Left;  [place] 00:48.7 AP% 11.3 CDE 5.46 Fluid Pack Lot # [id number] H   CATARACT EXTRACTION W/PHACO Right 04/23/2017   Procedure: CATARACT EXTRACTION PHACO AND INTRAOCULAR LENS PLACEMENT ([facility]);  Surgeon:  Birder Robson, MD;  Location: [facility];  Service: Ophthalmology;  Laterality: Right;  [place] 00:29 AP% 12.8 CDE 3.79 FLUID PACK LOT # [id number]   FOOT ARTHROPLASTY     JOINT REPLACEMENT     x2 tkr   TOTAL SHOULDER ARTHROPLASTY Left 07/02/2013   Procedure: LEFT TOTAL SHOULDER ARTHROPLASTY;  Surgeon: Marin Shutter, MD;  Location: [facility];  Service: Orthopedics;  Laterality: Left;   TOTAL SHOULDER ARTHROPLASTY Right 04/10/2018   Procedure: TOTAL REVERSE SHOULDER ARTHROPLASTY;  Surgeon: Justice Britain, MD;  Location: [facility];  Service: Orthopedics;  Laterality: Right;  159mn    There were no vitals filed for this visit.   Subjective Assessment - 04/24/21 1224     Subjective Pt. reports no new complaints with pain.  Pt. states she went to gym on Saturday.    Pertinent History extensive PMH.  Pt. was participating with Pulmonary Rehab and pt. states that she is hoping to return to Pulmonary Rehab when appropriate.  Pt. states they were concerned about her LE strength/ balance getting on/off equipment.    Limitations Standing;Walking;House hold activities    Patient Stated Goals Increase B LE muscle strength/ return to Pulmonary Rehab    Currently in  Pain? No/denies               Nustep L4 12 min. B UE/LE.    No charge today.       PT Long Term Goals - 04/04/21 1201       PT LONG TERM GOAL #1   Title Pt. will improve 5XSTS to <15 sec to improve functional mobility.    Baseline >20 sec.    Time 8    Period Weeks    Status Partially Met    Target Date 04/26/21      PT LONG TERM GOAL #2   Title Pt. will increase B LE muscle strength 1/2 muscle grade to improve safety/ independence with functional mobility.    Baseline neurogenic claudication. Pt. presents with good strength in bilat. hip add and abd, bilat DF, and quadriceps. Pt. has 4/5 strength in B hip flex, 4+/5 in R knee flex, and R inversion. Pt. has 4/5 strength with L knee flex, R eversion, and L inverison.    Time 8     Period Weeks    Status Partially Met    Target Date 04/26/21      PT LONG TERM GOAL #3   Title Pt. will demonstrate improved normal gait speed to 0.4 m/s to decrease falls risk and improve ability to safely walk in community.    Baseline TBD    Time 8    Period Weeks    Status On-going    Target Date 04/26/21      PT LONG TERM GOAL #4   Title Pt. will demonstrate safety with ascending 4 stairs with at most one handrail to improve community ambulation.    Baseline Pt able to ascend/ descend stairs with both handrails and lateral step to pattern.    Time 8    Period Weeks    Status Partially Met    Target Date 04/26/21                   Plan - 04/24/21 1229     Clinical Impression Statement Pt. treatment session limited by GI issues and 2 restroom breaks for extended period of time.  Pt. was only able to complete Nustep for 12 minutes prior to 2nd restroom break. Pt not appropriate for ther.ex. today and PT will hold off on tx.  No charge. Pt. instructed to stay hydrated and return to gym when appropriate.    Personal Factors and Comorbidities Age;Comorbidity 3+    Comorbidities CPD, HTN, Obesity    Stability/Clinical Decision Making Evolving/Moderate complexity    Clinical Decision Making Moderate    Rehab Potential Fair    PT Frequency 2x / week    PT Duration 8 weeks    PT Treatment/Interventions Cryotherapy;Electrical Stimulation;Ultrasound;Moist Heat;Gait training;Functional mobility training;Therapeutic activities;Therapeutic exercise;Neuromuscular re-education;Scar mobilization;Balance training;Stair training;Patient/family education;Manual techniques;ADLs/Self Care Home Management    PT Next Visit Plan Dynamic balance tasks/ outside walking on ramp/ curb.    Consulted and Agree with Plan of Care Patient             Patient will benefit from skilled therapeutic intervention in order to improve the following deficits and impairments:  Decreased endurance,  Decreased range of motion, Decreased skin integrity, Decreased strength, Hypomobility, Impaired UE functional use, Increased fascial restricitons, Pain, Postural dysfunction, Impaired flexibility, Decreased scar mobility, Decreased mobility, Decreased balance, Improper body mechanics, Abnormal gait, Decreased activity tolerance, Impaired sensation  Visit Diagnosis: Muscle weakness (generalized)  Gait difficulty  Abnormal posture  Imbalance     Problem List Patient Active Problem List   Diagnosis Date Noted   S/P reverse total shoulder arthroplasty, 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   Chronic diastolic heart failure ([place]) 08/09/2016   COPD (chronic obstructive pulmonary disease) ([place]) 08/09/2016   Pulmonary infiltrates    Pneumonia due to Streptococcus pneumoniae ([place])    Fever    Community acquired pneumonia of right lung    Palliative care by specialist    Goals of care, counseling/discussion    Pressure injury of skin 07/03/2016   COPD exacerbation ([place]) 07/21/2015   Acute renal insufficiency 07/21/2015   Hyperglycemia 07/21/2015   Benign essential HTN 07/21/2015   S/P shoulder replacement 07/02/2013   Pura Spice, PT, DPT # [id number] 04/24/2021, 12:35 PM  [facility] [address] [place], [name], [address] Phone: [phone]   Fax:  [phone]  Name: Hannah Powell MRN: [id number] Date of Birth: Dec 09, 1945

## 2021-04-25 ENCOUNTER — Ambulatory Visit: Payer: Medicare HMO | Admitting: Physical Therapy

## 2021-04-26 ENCOUNTER — Encounter: Payer: Medicare HMO | Admitting: Physical Therapy

## 2021-04-27 ENCOUNTER — Encounter: Payer: Self-pay | Admitting: Physical Therapy

## 2021-04-27 ENCOUNTER — Encounter: Payer: Medicare HMO | Admitting: Dermatology

## 2021-04-27 ENCOUNTER — Other Ambulatory Visit: Payer: Self-pay

## 2021-04-27 ENCOUNTER — Ambulatory Visit: Payer: Medicare HMO | Admitting: Physical Therapy

## 2021-04-27 DIAGNOSIS — M6281 Muscle weakness (generalized): Secondary | ICD-10-CM

## 2021-04-27 DIAGNOSIS — R2689 Other abnormalities of gait and mobility: Secondary | ICD-10-CM

## 2021-04-27 DIAGNOSIS — R293 Abnormal posture: Secondary | ICD-10-CM

## 2021-04-27 DIAGNOSIS — R269 Unspecified abnormalities of gait and mobility: Secondary | ICD-10-CM | POA: Diagnosis not present

## 2021-04-27 NOTE — Therapy (Signed)
[facility] [address] [place], [name], [address] Phone: [phone]   Fax:  [phone]  Physical Therapy Treatment  Patient Details  Name: Hannah Powell MRN: [id number] Date of Birth: [birth year] Referring Provider (PT): Dr. Ottie Glazier   Encounter Date: 04/27/2021   PT End of Session - 04/27/21 1244     Visit Number 14    Number of Visits 22    Date for PT Re-Evaluation 05/25/21    Authorization - Visit Number 4    Authorization - Number of Visits 10    PT Start Time 9450    PT Stop Time 1337    PT Time Calculation (min) 52 min    Equipment Utilized During Treatment Gait belt    Activity Tolerance Patient tolerated treatment well;Patient limited by fatigue    Behavior During Therapy WFL for tasks assessed/performed             Past Medical History:  Diagnosis Date   Anxiety    Arthritis    CHF (congestive heart failure) (HCC)    COPD (chronic obstructive pulmonary disease) ([place])    Dysplastic nevus 04/22/2019   R abdomen parallel to sup umbilicus - mod   Dysplastic nevus 04/22/2019   R low back above waistline - mild    Edema    feet/legs   Hypertension    dr Otho Najjar      Hypothyroidism    Leg weakness, bilateral    Osteoporosis    Oxygen deficit    2l with bipap at night   RLS (restless legs syndrome)    Shortness of breath    Sleep apnea    bipap   Wheezing     Past Surgical History:  Procedure Laterality Date   BACK SURGERY     cervical   BREAST BIOPSY Left    needle bx-neg   CATARACT EXTRACTION W/PHACO Left 05/07/2017   Procedure: CATARACT EXTRACTION PHACO AND INTRAOCULAR LENS PLACEMENT ([place]);  Surgeon: Birder Robson, MD;  Location: [facility];  Service: Ophthalmology;  Laterality: Left;  [place] 00:48.7 AP% 11.3 CDE 5.46 Fluid Pack Lot # [id number] H   CATARACT EXTRACTION W/PHACO Right 04/23/2017   Procedure: CATARACT EXTRACTION PHACO AND INTRAOCULAR LENS PLACEMENT ([facility]);  Surgeon:  Birder Robson, MD;  Location: [facility];  Service: Ophthalmology;  Laterality: Right;  [place] 00:29 AP% 12.8 CDE 3.79 FLUID PACK LOT # [id number]   FOOT ARTHROPLASTY     JOINT REPLACEMENT     x2 tkr   TOTAL SHOULDER ARTHROPLASTY Left 07/02/2013   Procedure: LEFT TOTAL SHOULDER ARTHROPLASTY;  Surgeon: Marin Shutter, MD;  Location: [facility];  Service: Orthopedics;  Laterality: Left;   TOTAL SHOULDER ARTHROPLASTY Right 04/10/2018   Procedure: TOTAL REVERSE SHOULDER ARTHROPLASTY;  Surgeon: Justice Britain, MD;  [facility];  Service: Orthopedics;  Laterality: Right;  160mn    There were no vitals filed for this visit.   Subjective Assessment - 04/28/21 1933     Subjective Pt. states her GI is doing better since last tx. session.  Pt. reports she feels her legs are stronger since starting PT and participating with gym based ex./ bike.    Pertinent History extensive PMH.  Pt. was participating with Pulmonary Rehab and pt. states that she is hoping to return to Pulmonary Rehab when appropriate.  Pt. states they were concerned about her LE strength/ balance getting on/off equipment.    Limitations Standing;Walking;House hold activities    Patient Stated Goals  Increase B LE muscle strength/ return to Pulmonary Rehab    Currently in Pain? No/denies                  No c/o pain.       Ther.ex.:   Nustep L4 Seat 9-10 for 12+ min. With B UE/LE.  Seated B UE wand shoulder flexion/ chest press 20x.   O2 sat. 98%.     Seated/ standing ex.: LAQ/ marching/ hip flexion 20x in //-bars for safety.    Walking in //-bars: marching/ lateral walking/ backwards walking 2 laps each.  Cuing for posture.  Pt. Requires frequent seated rest breaks.  Decrease O2 sat. To 88% during lateral walking 2 laps.  Recovered quickly.      Neuro:  Ambulate around gym 1 time with mirror feedback for posture correction.   Standing ball toss in //-bars for 2.5 min. (Fatigue noted).    Amb. Forward/backwards  2x in //-bars (mirror feedback)- O2 sat 88% after task and returned to 95% in <1 minute during rest.     6" step touches (alternate) with B UE assist in //-bars.   Sit to stands from blue mat table: (5x)- fatigue.  Requires multiple attempts for 5th stand due to LE muscle fatigue/ PT raised table 1 in. Higher to assist.      Walking in clinic/ outside to car with use of RW.          PT Long Term Goals - 04/28/21 1942       PT LONG TERM GOAL #1   Title Pt. will improve 5XSTS to <15 sec to improve functional mobility.    Baseline >20 sec.    Time 4    Period Weeks    Status Partially Met    Target Date 05/25/21      PT LONG TERM GOAL #2   Title Pt. will increase B LE muscle strength 1/2 muscle grade to improve safety/ independence with functional mobility.    Baseline neurogenic claudication. Pt. presents with good strength in bilat. hip add and abd, bilat DF, and quadriceps. Pt. has 4/5 strength in B hip flex, 4+/5 in R knee flex, and R inversion. Pt. has 4/5 strength with L knee flex, R eversion, and L inverison.    Time 4    Period Weeks    Status Partially Met    Target Date 05/25/21      PT LONG TERM GOAL #3   Title Pt. will demonstrate improved normal gait speed to 0.4 m/s to decrease falls risk and improve ability to safely walk in community.    Time 4    Period Weeks    Status Not Met    Target Date 05/25/21      PT LONG TERM GOAL #4   Title Pt. will demonstrate safety with ascending 4 stairs with at most one handrail to improve community ambulation.    Baseline Pt able to ascend/ descend stairs with both handrails and lateral step to pattern.    Time 4    Period Weeks    Status Partially Met    Target Date 05/25/21                   Plan - 04/28/21 1934     Clinical Impression Statement PT tx. focused on increasing standing endurance and walking tasks with increase reps/ decrease rest breaks.  PT reassessed O2 sat. t/o tx. and pt. only had 1 time  during lateral walking  in //-bars (2 reps) when O2 sat. was 88%.  Pts. Os sat recovered to 95% in <1 min.  Pt. works hard during tx. session with focus on B LE muscle strengthening and standing/walking tolerance with upright posture.  Pt. requires moderate cuing to correct posture/ head position during walking, esp. when turning/ walking outside.  No increase c/o pain reported during tx. session.  Pt. will continue to benefit from going to gym to complete standing LE ex./ bike.    Personal Factors and Comorbidities Age;Comorbidity 3+    Comorbidities CPD, HTN, Obesity    Stability/Clinical Decision Making Evolving/Moderate complexity    Clinical Decision Making Moderate    Rehab Potential Fair    PT Frequency 2x / week    PT Duration 8 weeks    PT Treatment/Interventions Cryotherapy;Electrical Stimulation;Ultrasound;Moist Heat;Gait training;Functional mobility training;Therapeutic activities;Therapeutic exercise;Neuromuscular re-education;Scar mobilization;Balance training;Stair training;Patient/family education;Manual techniques;ADLs/Self Care Home Management    PT Next Visit Plan Dynamic balance tasks/ outside walking on ramp/ curb.    Consulted and Agree with Plan of Care Patient             Patient will benefit from skilled therapeutic intervention in order to improve the following deficits and impairments:  Decreased endurance, Decreased range of motion, Decreased skin integrity, Decreased strength, Hypomobility, Impaired UE functional use, Increased fascial restricitons, Pain, Postural dysfunction, Impaired flexibility, Decreased scar mobility, Decreased mobility, Decreased balance, Improper body mechanics, Abnormal gait, Decreased activity tolerance, Impaired sensation  Visit Diagnosis: Muscle weakness (generalized)  Gait difficulty  Abnormal posture  Imbalance     Problem List Patient Active Problem List   Diagnosis Date Noted   S/P reverse total shoulder arthroplasty,  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   Chronic diastolic heart failure ([place]) 08/09/2016   COPD (chronic obstructive pulmonary disease) ([place]) 08/09/2016   Pulmonary infiltrates    Pneumonia due to Streptococcus pneumoniae ([place])    Fever    Community acquired pneumonia of right lung    Palliative care by specialist    Goals of care, counseling/discussion    Pressure injury of skin 07/03/2016   COPD exacerbation ([place]) 07/21/2015   Acute renal insufficiency 07/21/2015   Hyperglycemia 07/21/2015   Benign essential HTN 07/21/2015   S/P shoulder replacement 07/02/2013   Pura Spice, PT, DPT # [id number] 04/28/2021, 7:44 PM  [facility] [address] [place], [name], [address] Phone: [phone]   Fax:  [phone]  Name: Fantasia Jinkins MRN: [id number] Date of Birth: [birth year]

## 2021-04-28 DIAGNOSIS — R0902 Hypoxemia: Secondary | ICD-10-CM | POA: Diagnosis not present

## 2021-04-28 DIAGNOSIS — M79603 Pain in arm, unspecified: Secondary | ICD-10-CM | POA: Diagnosis not present

## 2021-04-28 DIAGNOSIS — R52 Pain, unspecified: Secondary | ICD-10-CM | POA: Diagnosis not present

## 2021-05-01 ENCOUNTER — Other Ambulatory Visit: Payer: Self-pay

## 2021-05-01 ENCOUNTER — Encounter: Payer: Self-pay | Admitting: Physical Therapy

## 2021-05-01 ENCOUNTER — Ambulatory Visit: Payer: Medicare HMO | Admitting: Physical Therapy

## 2021-05-01 DIAGNOSIS — R269 Unspecified abnormalities of gait and mobility: Secondary | ICD-10-CM | POA: Diagnosis not present

## 2021-05-01 DIAGNOSIS — R293 Abnormal posture: Secondary | ICD-10-CM | POA: Diagnosis not present

## 2021-05-01 DIAGNOSIS — M6281 Muscle weakness (generalized): Secondary | ICD-10-CM | POA: Diagnosis not present

## 2021-05-01 DIAGNOSIS — R2689 Other abnormalities of gait and mobility: Secondary | ICD-10-CM | POA: Diagnosis not present
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
## 2021-05-02 ENCOUNTER — Encounter: Payer: Medicare HMO | Admitting: Physical Therapy

## 2021-05-03 ENCOUNTER — Other Ambulatory Visit: Payer: Self-pay

## 2021-05-03 ENCOUNTER — Ambulatory Visit: Payer: Medicare HMO | Admitting: Physical Therapy

## 2021-05-03 DIAGNOSIS — R293 Abnormal posture: Secondary | ICD-10-CM

## 2021-05-03 DIAGNOSIS — R269 Unspecified abnormalities of gait and mobility: Secondary | ICD-10-CM | POA: Diagnosis not present

## 2021-05-03 DIAGNOSIS — R2689 Other abnormalities of gait and mobility: Secondary | ICD-10-CM | POA: Diagnosis not present

## 2021-05-03 DIAGNOSIS — M6281 Muscle weakness (generalized): Secondary | ICD-10-CM | POA: Diagnosis not present

## 2021-05-04 ENCOUNTER — Encounter: Payer: Medicare HMO | Admitting: Physical Therapy

## 2021-05-04 ENCOUNTER — Encounter: Payer: Self-pay | Admitting: Physical Therapy

## 2021-05-04 NOTE — Therapy (Signed)
[name] [name] [name]. [name], [name], [address] Phone: [phone]   Fax:  [phone]  Physical Therapy Treatment  Patient Details  Name: Hannah Powell MRN: [id number] Date of Birth: [birth year] Referring Provider (PT): Dr. Ottie Glazier   Encounter Date: 05/03/2021   PT End of Session - 05/04/21 0955     Visit Number 16    Number of Visits 22    Date for PT Re-Evaluation 05/25/21    Authorization - Visit Number 6    Authorization - Number of Visits 10    PT Start Time 1103    PT Stop Time 1153    PT Time Calculation (min) 50 min    Activity Tolerance Patient tolerated treatment well;Patient limited by fatigue    Behavior During Therapy [facility] for tasks assessed/performed             Past Medical History:  Diagnosis Date   Anxiety    Arthritis    CHF (congestive heart failure) (HCC)    COPD (chronic obstructive pulmonary disease) ([place])    Dysplastic nevus 04/22/2019   R abdomen parallel to sup umbilicus - mod   Dysplastic nevus 04/22/2019   R low back above waistline - mild    Edema    feet/legs   Hypertension    dr Otho Najjar      Hypothyroidism    Leg weakness, bilateral    Osteoporosis    Oxygen deficit    2l with bipap at night   RLS (restless legs syndrome)    Shortness of breath    Sleep apnea    bipap   Wheezing     Past Surgical History:  Procedure Laterality Date   BACK SURGERY     cervical   BREAST BIOPSY Left    needle bx-neg   CATARACT EXTRACTION W/PHACO Left 05/07/2017   Procedure: CATARACT EXTRACTION PHACO AND INTRAOCULAR LENS PLACEMENT ([place]);  Surgeon: Birder Robson, MD;  Location: [facility];  Service: Ophthalmology;  Laterality: Left;  [place] 00:48.7 AP% 11.3 CDE 5.46 Fluid Pack Lot # [id number]   CATARACT EXTRACTION W/PHACO Right 04/23/2017   Procedure: CATARACT EXTRACTION PHACO AND INTRAOCULAR LENS PLACEMENT ([facility]);  Surgeon: Birder Robson, MD;  Location: [facility];  Service:  Ophthalmology;  Laterality: Right;  [place] 00:29 AP% 12.8 CDE 3.79 FLUID PACK LOT # [id number]   FOOT ARTHROPLASTY     JOINT REPLACEMENT     x2 tkr   TOTAL SHOULDER ARTHROPLASTY Left 07/02/2013   Procedure: LEFT TOTAL SHOULDER ARTHROPLASTY;  Surgeon: Marin Shutter, MD;  Location: [place];  Service: Orthopedics;  Laterality: Left;   TOTAL SHOULDER ARTHROPLASTY Right 04/10/2018   Procedure: TOTAL REVERSE SHOULDER ARTHROPLASTY;  Surgeon: Justice Britain, MD;  Location: [facility];  Service: Orthopedics;  Laterality: Right;  166mn    There were no vitals filed for this visit.   Subjective Assessment - 05/04/21 0754     Subjective Pt. drove to PT independently and walked in from parking lot with use of RW.  No c/o pain prior to PT tx. session.    Pertinent History extensive PMH.  Pt. was participating with Pulmonary Rehab and pt. states that she is hoping to return to Pulmonary Rehab when appropriate.  Pt. states they were concerned about her LE strength/ balance getting on/off equipment.    Limitations Standing;Walking;House hold activities    Patient Stated Goals Increase B LE muscle strength/ return to Pulmonary Rehab    Currently in Pain?  No/denies             Ther.ex.:   Nustep L4 Seat 9-10 for 12 min. With B UE/LE (0.5 miles)- less time today to complete 0.5 miles   Seated B UE ex.: shoulder flexion/ chest press 20x. O2 sat. 92%.      Seated/ standing ex.: LAQ/ marching/ hip flexion/ heel raises 20x in //-bars for safety.    Standing wt. Shifting 10x with B UE assist required.   Standing shoulder flexion in //-bars with CGA for safety secondary fear of knee buckling.        Neuro:   Walking in //-bars: marching/ lateral walking 3 laps each.  Cuing for posture.  Pt. Requires frequent seated rest breaks.  Decrease O2 sat. To 89-92% during lateral walking 3 laps.    3" step ups/ overs in //-bars with B UE assist 2 laps.   6" step touches (alternate) with B UE assist in //-bars.   Moderate LE fatigue/ requested seated rest break.      Sit to stands from gray chair with 2 inch Airex pad (5x)- fatigue.  Requires L UE assist on arm rest for support/ proper technique.        Walking in clinic/ outside to car with use of RW.         PT Long Term Goals - 04/28/21 1942       PT LONG TERM GOAL #1   Title Pt. will improve 5XSTS to <15 sec to improve functional mobility.    Baseline >20 sec.    Time 4    Period Weeks    Status Partially Met    Target Date 05/25/21      PT LONG TERM GOAL #2   Title Pt. will increase B LE muscle strength 1/2 muscle grade to improve safety/ independence with functional mobility.    Baseline neurogenic claudication. Pt. presents with good strength in bilat. hip add and abd, bilat DF, and quadriceps. Pt. has 4/5 strength in B hip flex, 4+/5 in R knee flex, and R inversion. Pt. has 4/5 strength with L knee flex, R eversion, and L inverison.    Time 4    Period Weeks    Status Partially Met    Target Date 05/25/21      PT LONG TERM GOAL #3   Title Pt. will demonstrate improved normal gait speed to 0.4 m/s to decrease falls risk and improve ability to safely walk in community.    Time 4    Period Weeks    Status Not Met    Target Date 05/25/21      PT LONG TERM GOAL #4   Title Pt. will demonstrate safety with ascending 4 stairs with at most one handrail to improve community ambulation.    Baseline Pt able to ascend/ descend stairs with both handrails and lateral step to pattern.    Time 4    Period Weeks    Status Partially Met    Target Date 05/25/21                   Plan - 05/04/21 0956     Clinical Impression Statement Pt. remains cautious/ fearful of knee buckling during standing ther.ex. in //-bars and while walking in PT clinic.  No episodes of knee buckling during tx. and SBA/CGA required for safety.  Standing wt. shifting with no UE assist and pt. very cautious and prefers use of UE assist with all standing  tasks.  Increase walking tolerance in //-bars today with an additional lap prior to required seated rest break.  Pt. encouarged to remain active with weekend and complete ex. program/ return to aquatic ex.    Personal Factors and Comorbidities Age;Comorbidity 3+    Comorbidities CPD, HTN, Obesity    Stability/Clinical Decision Making Evolving/Moderate complexity    Clinical Decision Making Moderate    Rehab Potential Fair    PT Frequency 2x / week    PT Duration 8 weeks    PT Treatment/Interventions Cryotherapy;Electrical Stimulation;Ultrasound;Moist Heat;Gait training;Functional mobility training;Therapeutic activities;Therapeutic exercise;Neuromuscular re-education;Scar mobilization;Balance training;Stair training;Patient/family education;Manual techniques;ADLs/Self Care Home Management    PT Next Visit Plan Dynamic balance tasks/ outside walking on ramp/ curb.  Discuss aquatic ex.    Consulted and Agree with Plan of Care Patient             Patient will benefit from skilled therapeutic intervention in order to improve the following deficits and impairments:  Decreased endurance, Decreased range of motion, Decreased skin integrity, Decreased strength, Hypomobility, Impaired UE functional use, Increased fascial restricitons, Pain, Postural dysfunction, Impaired flexibility, Decreased scar mobility, Decreased mobility, Decreased balance, Improper body mechanics, Abnormal gait, Decreased activity tolerance, Impaired sensation  Visit Diagnosis: Muscle weakness (generalized)  Gait difficulty  Abnormal posture  Imbalance     Problem List Patient Active Problem List   Diagnosis Date Noted   S/P reverse total shoulder arthroplasty, 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   Chronic diastolic heart failure ([place]) 08/09/2016   COPD (chronic obstructive pulmonary disease) ([place]) 08/09/2016   Pulmonary infiltrates    Pneumonia due to Streptococcus pneumoniae ([place])    Fever    Community acquired pneumonia of  right lung    Palliative care by specialist    Goals of care, counseling/discussion    Pressure injury of skin 07/03/2016   COPD exacerbation ([place]) 07/21/2015   Acute renal insufficiency 07/21/2015   Hyperglycemia 07/21/2015   Benign essential HTN 07/21/2015   S/P shoulder replacement 07/02/2013   Pura Spice, PT, DPT # [id number] 05/04/2021, 10:00 AM  [facility] [address]. [place], [name], [address] Phone: [phone]   Fax:  [phone]  Name: Hannah Powell MRN: [id number] Date of Birth: 1945-04-15

## 2021-05-05 DIAGNOSIS — J9611 Chronic respiratory failure with hypoxia: Secondary | ICD-10-CM | POA: Diagnosis not present

## 2021-05-05 DIAGNOSIS — E039 Hypothyroidism, unspecified: Secondary | ICD-10-CM | POA: Diagnosis not present

## 2021-05-05 DIAGNOSIS — G63 Polyneuropathy in diseases classified elsewhere: Secondary | ICD-10-CM | POA: Diagnosis not present

## 2021-05-05 DIAGNOSIS — I5032 Chronic diastolic (congestive) heart failure: Secondary | ICD-10-CM | POA: Diagnosis not present

## 2021-05-05 DIAGNOSIS — E611 Iron deficiency: Secondary | ICD-10-CM | POA: Diagnosis not present

## 2021-05-05 DIAGNOSIS — Z79899 Other long term (current) drug therapy: Secondary | ICD-10-CM | POA: Diagnosis not present

## 2021-05-05 DIAGNOSIS — R1084 Generalized abdominal pain: Secondary | ICD-10-CM | POA: Diagnosis not present

## 2021-05-05 DIAGNOSIS — I110 Hypertensive heart disease with heart failure: Secondary | ICD-10-CM | POA: Diagnosis not present

## 2021-05-05 DIAGNOSIS — J439 Emphysema, unspecified: Secondary | ICD-10-CM | POA: Diagnosis not present

## 2021-05-05 DIAGNOSIS — R109 Unspecified abdominal pain: Secondary | ICD-10-CM | POA: Diagnosis not present

## 2021-05-08 ENCOUNTER — Other Ambulatory Visit: Payer: Self-pay

## 2021-05-08 ENCOUNTER — Ambulatory Visit: Payer: Medicare HMO | Admitting: Physical Therapy

## 2021-05-08 ENCOUNTER — Encounter: Payer: Self-pay | Admitting: Physical Therapy

## 2021-05-08 DIAGNOSIS — R293 Abnormal posture: Secondary | ICD-10-CM

## 2021-05-08 DIAGNOSIS — M6281 Muscle weakness (generalized): Secondary | ICD-10-CM

## 2021-05-08 DIAGNOSIS — R269 Unspecified abnormalities of gait and mobility: Secondary | ICD-10-CM

## 2021-05-08 DIAGNOSIS — R2689 Other abnormalities of gait and mobility: Secondary | ICD-10-CM | POA: Diagnosis not present

## 2021-05-08 NOTE — Therapy (Signed)
[place] [facility] [address] [place], [name], [address] Phone: [phone]   Fax:  [phone]  Physical Therapy Treatment  Patient Details  Name: Hannah Powell MRN: [id number] Date of Birth: [birth year] Referring Provider (PT): Dr. Ottie Glazier   Encounter Date: 05/08/2021   PT End of Session - 05/09/21 0932     Visit Number 17    Number of Visits 22    Date for PT Re-Evaluation 05/25/21    Authorization - Visit Number 7    Authorization - Number of Visits 10    PT Start Time 1106    PT Stop Time 5643    PT Time Calculation (min) 59 min    Activity Tolerance Patient tolerated treatment well;Patient limited by fatigue    Behavior During Therapy [facility] for tasks assessed/performed             Past Medical History:  Diagnosis Date   Anxiety    Arthritis    CHF (congestive heart failure) (HCC)    COPD (chronic obstructive pulmonary disease) ([place])    Dysplastic nevus 04/22/2019   R abdomen parallel to sup umbilicus - mod   Dysplastic nevus 04/22/2019   R low back above waistline - mild    Edema    feet/legs   Hypertension    dr Otho Najjar      Hypothyroidism    Leg weakness, bilateral    Osteoporosis    Oxygen deficit    2l with bipap at night   RLS (restless legs syndrome)    Shortness of breath    Sleep apnea    bipap   Wheezing     Past Surgical History:  Procedure Laterality Date   BACK SURGERY     cervical   BREAST BIOPSY Left    needle bx-neg   CATARACT EXTRACTION W/PHACO Left 05/07/2017   Procedure: CATARACT EXTRACTION PHACO AND INTRAOCULAR LENS PLACEMENT ([place]);  Surgeon: Birder Robson, MD;  Location: [facility];  Service: Ophthalmology;  Laterality: Left;  [place] 00:48.7 AP% 11.3 CDE 5.46 Fluid Pack Lot # [id number] H   CATARACT EXTRACTION W/PHACO Right 04/23/2017   Procedure: CATARACT EXTRACTION PHACO AND INTRAOCULAR LENS PLACEMENT ([facility]);  Surgeon: Birder Robson, MD;  Location: [facility];  Service:  Ophthalmology;  Laterality: Right;  [place] 00:29 AP% 12.8 CDE 3.79 FLUID PACK LOT # [id number] H   FOOT ARTHROPLASTY     JOINT REPLACEMENT     x2 tkr   TOTAL SHOULDER ARTHROPLASTY Left 07/02/2013   Procedure: LEFT TOTAL SHOULDER ARTHROPLASTY;  Surgeon: Marin Shutter, MD;  Location: [place];  Service: Orthopedics;  Laterality: Left;   TOTAL SHOULDER ARTHROPLASTY Right 04/10/2018   Procedure: TOTAL REVERSE SHOULDER ARTHROPLASTY;  Surgeon: Justice Britain, MD;  [facility];  Service: Orthopedics;  Laterality: Right;  165mn    There were no vitals filed for this visit.   Subjective Assessment - 05/09/21 0930     Subjective Pt. entered PT with use of RW and slow but controlled gait pattern/cadence.  No recent LOB.  Pt. feels her energy level is low and may be a result of not taking a medication since last Friday (Modafinil).    Pertinent History extensive PMH.  Pt. was participating with Pulmonary Rehab and pt. states that she is hoping to return to Pulmonary Rehab when appropriate.  Pt. states they were concerned about her LE strength/ balance getting on/off equipment.    Limitations Standing;Walking;House hold activities    Patient Stated Goals  Increase B LE muscle strength/ return to Pulmonary Rehab    Currently in Pain? No/denies                Ther.ex.:   Nustep L4 Seat 10 for 12 min. With B UE/LE- consistent cadence.   Seated B UE ex. With 4# wand: shoulder flexion/ chest press 20x.  Standing bicep curls 4# wand 20x (CGA for safety).  Mirror feedback for posture correction.  Controlled stand to sit in chair with limited UE assist.        Seated/ standing ex.: LAQ/ marching/ hip flexion/ heel raises 20x in //-bars for safety.        Neuro:   Walking in //-bars: marching/ lateral walking 3 laps each.  Cuing for posture.  Pt. Requires frequent seated rest breaks.  O2 sat. Remained higher today (>95%)   6" step touches (alternate) with B UE assist in //-bars.  Moderate LE fatigue/  requested seated rest break.      Sit to stands from gray chair with 2 inch Airex pad (5x)- fatigue.  Requires L UE assist on arm rest for support/ proper technique.        Walking in clinic/ outside to car with use of RW.        PT Long Term Goals - 04/28/21 1942       PT LONG TERM GOAL #1   Title Pt. will improve 5XSTS to <15 sec to improve functional mobility.    Baseline >20 sec.    Time 4    Period Weeks    Status Partially Met    Target Date 05/25/21      PT LONG TERM GOAL #2   Title Pt. will increase B LE muscle strength 1/2 muscle grade to improve safety/ independence with functional mobility.    Baseline neurogenic claudication. Pt. presents with good strength in bilat. hip add and abd, bilat DF, and quadriceps. Pt. has 4/5 strength in B hip flex, 4+/5 in R knee flex, and R inversion. Pt. has 4/5 strength with L knee flex, R eversion, and L inverison.    Time 4    Period Weeks    Status Partially Met    Target Date 05/25/21      PT LONG TERM GOAL #3   Title Pt. will demonstrate improved normal gait speed to 0.4 m/s to decrease falls risk and improve ability to safely walk in community.    Time 4    Period Weeks    Status Not Met    Target Date 05/25/21      PT LONG TERM GOAL #4   Title Pt. will demonstrate safety with ascending 4 stairs with at most one handrail to improve community ambulation.    Baseline Pt able to ascend/ descend stairs with both handrails and lateral step to pattern.    Time 4    Period Weeks    Status Partially Met    Target Date 05/25/21                Plan - 05/09/21 0933     Clinical Impression Statement Pt. ambulates with slow gait pattern and use of RW around PT clinic.  Pt. concerned about knee buckling during standing ther.ex. with no UE assist during resisted UE ther.ex.  SBA/CGA for all walking and ex. in //-bars for safety and cuing for proper technique.  Moderate LE muscle fatigue after completion of ther.ex. and transfers  in clinic.  Pt. unable to stand  from gray chair with Airex pad to elevate without use of UE assist.  Pt. able to stand with L UE only on arm rest with moderate overall fatigue after several attempts.  Pt. encouraged to return to gym based ther.ex. and attempt to participate with aquatic ex. program.    Personal Factors and Comorbidities Age;Comorbidity 3+    Comorbidities CPD, HTN, Obesity    Stability/Clinical Decision Making Evolving/Moderate complexity    Clinical Decision Making Moderate    Rehab Potential Fair    PT Frequency 2x / week    PT Duration 8 weeks    PT Treatment/Interventions Cryotherapy;Electrical Stimulation;Ultrasound;Moist Heat;Gait training;Functional mobility training;Therapeutic activities;Therapeutic exercise;Neuromuscular re-education;Scar mobilization;Balance training;Stair training;Patient/family education;Manual techniques;ADLs/Self Care Home Management    PT Next Visit Plan Dynamic balance tasks/ outside walking on ramp/ curb.    Consulted and Agree with Plan of Care Patient             Patient will benefit from skilled therapeutic intervention in order to improve the following deficits and impairments:  Decreased endurance, Decreased range of motion, Decreased skin integrity, Decreased strength, Hypomobility, Impaired UE functional use, Increased fascial restricitons, Pain, Postural dysfunction, Impaired flexibility, Decreased scar mobility, Decreased mobility, Decreased balance, Improper body mechanics, Abnormal gait, Decreased activity tolerance, Impaired sensation  Visit Diagnosis: Muscle weakness (generalized)  Gait difficulty  Abnormal posture  Imbalance     Problem List Patient Active Problem List   Diagnosis Date Noted   S/P reverse total shoulder arthroplasty, 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   Chronic diastolic heart failure ([place]) 08/09/2016   COPD (chronic obstructive pulmonary disease) ([place]) 08/09/2016   Pulmonary infiltrates    Pneumonia due to  Streptococcus pneumoniae ([place])    Fever    Community acquired pneumonia of right lung    Palliative care by specialist    Goals of care, counseling/discussion    Pressure injury of skin 07/03/2016   COPD exacerbation ([place]) 07/21/2015   Acute renal insufficiency 07/21/2015   Hyperglycemia 07/21/2015   Benign essential HTN 07/21/2015   S/P shoulder replacement 07/02/2013   Pura Spice, PT, DPT # [id number] Cleopatra Cedar, SPT 05/09/2021, 3:08 PM  [place] [facility] [address] [place], [name], [address] Phone: [phone]   Fax:  [phone]  Name: Hannah Powell MRN: [id number] Date of Birth: 05-06-1945

## 2021-05-09 ENCOUNTER — Encounter: Payer: Medicare HMO | Admitting: Physical Therapy

## 2021-05-10 ENCOUNTER — Other Ambulatory Visit: Payer: Self-pay

## 2021-05-10 ENCOUNTER — Ambulatory Visit: Payer: Medicare HMO | Admitting: Physical Therapy

## 2021-05-10 ENCOUNTER — Encounter: Payer: Self-pay | Admitting: *Deleted

## 2021-05-10 DIAGNOSIS — R269 Unspecified abnormalities of gait and mobility: Secondary | ICD-10-CM

## 2021-05-10 DIAGNOSIS — M6281 Muscle weakness (generalized): Secondary | ICD-10-CM

## 2021-05-10 DIAGNOSIS — R2689 Other abnormalities of gait and mobility: Secondary | ICD-10-CM | POA: Diagnosis not present

## 2021-05-10 DIAGNOSIS — R293 Abnormal posture: Secondary | ICD-10-CM

## 2021-05-10 DIAGNOSIS — J449 Chronic obstructive pulmonary disease, unspecified: Secondary | ICD-10-CM

## 2021-05-10 NOTE — Progress Notes (Signed)
Pulmonary Individual Treatment Plan  Patient Details  Name: Pedro Oldenburg MRN: [id number] Date of Birth: 14-Dec-1945 Referring Provider:   Flowsheet Row Pulmonary Rehab from 01/10/2021 in [facility] Cardiac and Pulmonary Rehab  Referring Provider Ottie Glazier MD       Initial Encounter Date:  Flowsheet Row Pulmonary Rehab from 01/10/2021 in [facility] Cardiac and Pulmonary Rehab  Date 01/10/21       Visit Diagnosis: Chronic obstructive pulmonary disease, unspecified COPD type ([place])  Patient's Home Medications on Admission:  Current Outpatient Medications:    albuterol (PROVENTIL) (2.5 MG/3ML) 0.083% nebulizer solution, Take 2.5 mg by nebulization every 6 (six) hours as needed for wheezing or shortness of breath., Disp: , Rfl:    ALPRAZolam (XANAX) 0.25 MG tablet, Take 1 tablet (0.25 mg total) by mouth 3 (three) times daily as needed for anxiety., Disp: 30 tablet, Rfl: 0   ALPRAZolam (XANAX) 0.25 MG tablet, Take 1 tablet by mouth 3 (three) times daily as needed. (Patient not taking: Reported on 12/05/2020), Disp: , Rfl:    amoxicillin (AMOXIL) 500 MG capsule, amoxicillin 500 mg capsule (Patient not taking: Reported on 12/05/2020), Disp: , Rfl:    azelastine (ASTELIN) 0.1 % nasal spray, , Disp: , Rfl:    azithromycin (ZITHROMAX) 250 MG tablet, TAKE 1 TABLET (250 MG TOTAL) BY MOUTH DAILY., Disp: 90 tablet, Rfl: 1   B Complex-C (B-COMPLEX WITH VITAMIN C) tablet, Take 1 tablet by mouth daily. , Disp: , Rfl:    calcium carbonate (OSCAL) 1500 (600 Ca) MG TABS tablet, Take by mouth., Disp: , Rfl:    Calcium Carbonate-Vitamin D (CALCIUM-VITAMIN D) 500-200 MG-UNIT per tablet, Take 1 tablet by mouth daily., Disp: , Rfl:    cyclobenzaprine (FLEXERIL) 10 MG tablet, Take 10 mg by mouth 3 (three) times daily as needed for muscle spasms.  (Patient not taking: Reported on 12/05/2020), Disp: , Rfl:    ferrous sulfate 325 (65 FE) MG tablet, Take 325 mg by mouth daily with breakfast., Disp: , Rfl:     fluticasone furoate-vilanterol (BREO ELLIPTA) 200-25 MCG/INH AEPB, Inhale 1 puff into the lungs daily. (Patient not taking: Reported on 12/05/2020), Disp: 1 each, Rfl: 0   fluticasone furoate-vilanterol (BREO ELLIPTA) 200-25 MCG/INH AEPB, Inhale 1 puff into the lungs daily., Disp: 60 each, Rfl: 3   fluticasone furoate-vilanterol (BREO ELLIPTA) 200-25 MCG/INH AEPB, Inhale into the lungs. (Patient not taking: Reported on 12/05/2020), Disp: , Rfl:    furosemide (LASIX) 20 MG tablet, Take 2 tablets (40 mg total) by mouth 2 (two) times daily. (Patient not taking: Reported on 12/05/2020), Disp: , Rfl:    furosemide (LASIX) 80 MG tablet, Take by mouth., Disp: , Rfl:    gabapentin (NEURONTIN) 300 MG capsule, Take 300 mg by mouth at bedtime.  (Patient not taking: Reported on 12/05/2020), Disp: , Rfl:    ibuprofen (ADVIL) 800 MG tablet, Take by mouth. (Patient not taking: Reported on 12/05/2020), Disp: , Rfl:    ibuprofen (ADVIL,MOTRIN) 200 MG tablet, Take 800 mg by mouth every 6 (six) hours as needed for headache or mild pain. , Disp: , Rfl:    ipratropium-albuterol (DUONEB) 0.5-2.5 (3) MG/3ML SOLN, Take 3 mLs by nebulization every 4 (four) hours as needed., Disp: , Rfl:    ketoconazole (NIZORAL) 2 % cream, Apply once daily under the breast and left under arm., Disp: 60 g, Rfl: 1   levalbuterol (XOPENEX) 0.31 MG/3ML nebulizer solution, Inhale into the lungs., Disp: , Rfl:    levothyroxine (SYNTHROID) 112 MCG  tablet, Take 112 mcg by mouth daily before breakfast. Take 2 tabs po QD (Patient not taking: Reported on 12/05/2020), Disp: , Rfl:    levothyroxine (SYNTHROID) 112 MCG tablet, Take by mouth., Disp: , Rfl:    levothyroxine (SYNTHROID, LEVOTHROID) 200 MCG tablet, Take 200 mcg by mouth daily before breakfast., Disp: , Rfl:    loratadine-pseudoephedrine (CLARITIN-D 24-HOUR) 10-240 MG 24 hr tablet, Take 1 tablet by mouth daily as needed for allergies., Disp: , Rfl:    losartan (COZAAR) 25 MG tablet, Take 25 mg by  mouth daily., Disp: , Rfl:    losartan (COZAAR) 25 MG tablet, Take 1 tablet by mouth daily. (Patient not taking: Reported on 12/05/2020), Disp: , Rfl:    Multiple Vitamins-Minerals (MULTIVITAMIN WOMEN PO), Take 1 tablet by mouth daily., Disp: , Rfl:    Omega-3 Fatty Acids (OMEGA 3 PO), Take 1 capsule by mouth daily., Disp: , Rfl:    oxybutynin (DITROPAN) 5 MG tablet, Take 5 mg by mouth 3 (three) times daily. (Patient not taking: Reported on 12/05/2020), Disp: , Rfl:    oxybutynin (DITROPAN) 5 MG tablet, Take 1 tablet by mouth 3 (three) times daily., Disp: , Rfl:    potassium chloride SA (K-DUR,KLOR-CON) 20 MEQ tablet, Take 20 mEq by mouth 2 (two) times daily.  (Patient not taking: Reported on 12/05/2020), Disp: , Rfl:    potassium chloride SA (KLOR-CON) 20 MEQ tablet, Take 1 tablet by mouth 2 (two) times daily., Disp: , Rfl:    pramipexole (MIRAPEX) 1 MG tablet, Take 4 mg by mouth at bedtime. , Disp: , Rfl:    spironolactone (ALDACTONE) 25 MG tablet, Take 25 mg by mouth 2 (two) times daily. , Disp: , Rfl:    spironolactone (ALDACTONE) 25 MG tablet, Take 1 tablet by mouth 2 (two) times daily. (Patient not taking: Reported on 12/05/2020), Disp: , Rfl:    topiramate (TOPAMAX) 100 MG tablet, Take 100 mg by mouth 2 (two) times daily., Disp: , Rfl:    topiramate (TOPAMAX) 100 MG tablet, Take by mouth. (Patient not taking: Reported on 12/05/2020), Disp: , Rfl:    traMADol (ULTRAM) 50 MG tablet, Take 1 tablet (50 mg total) by mouth every 8 (eight) hours as needed (for mild pain)., Disp: 30 tablet, Rfl: 0   traMADol (ULTRAM) 50 MG tablet, Take 1 tablet by mouth every 8 (eight) hours as needed. (Patient not taking: Reported on 12/05/2020), Disp: , Rfl:    zolpidem (AMBIEN) 10 MG tablet, Take 5 mg by mouth at bedtime as needed for sleep., Disp: , Rfl:   Past Medical History: Past Medical History:  Diagnosis Date   Anxiety    Arthritis    CHF (congestive heart failure) (HCC)    COPD (chronic obstructive  pulmonary disease) ([place])    Dysplastic nevus 04/22/2019   R abdomen parallel to sup umbilicus - mod   Dysplastic nevus 04/22/2019   R low back above waistline - mild    Edema    feet/legs   Hypertension    dr Otho Najjar      Hypothyroidism    Leg weakness, bilateral    Osteoporosis    Oxygen deficit    2l with bipap at night   RLS (restless legs syndrome)    Shortness of breath    Sleep apnea    bipap   Wheezing     Tobacco Use: Social History   Tobacco Use  Smoking Status Former   Packs/day: 1.00   Years: 10.00  Pack years: 10.00   Types: Cigarettes   Quit date: 03/20/1975   Years since quitting: 46.1  Smokeless Tobacco Never    Labs: Recent Review Flowsheet Data     Labs for ITP Cardiac and Pulmonary Rehab Latest Ref Rng & Units 07/15/2016 07/15/2016 07/17/2016 08/01/2016 11/17/2020   Trlycerides <150 mg/dL - - 142 - -   Hemoglobin A1c 4.0 - 6.0 % - - - - -   PHART 7.350 - 7.450 7.44 7.52(H) - - 7.39   PCO2ART 32.0 - 48.0 mmHg 91(HH) 71(HH) - - 55(H)   HCO3 20.0 - 28.0 mmol/L 61.8(H) 58.0(H) - 54.6(H) 33.3(H)   ACIDBASEDEF 0.0 - 2.0 mmol/L - - - - -   O2SAT % 97.2 94.9 - 67.1 90.7        Pulmonary Assessment Scores:  Pulmonary Assessment Scores     Row Name 01/10/21 1126         ADL UCSD   ADL Phase Entry     SOB Score total 11     Rest 0     Walk 1     Stairs 0  didn't complete as she stated she does not do them     Bath 0     Dress 1     Shop 1       CAT Score   CAT Score 9       mMRC Score   mMRC Score 0              UCSD: Self-administered rating of dyspnea associated with activities of daily living (ADLs) 6-point scale (0 = "not at all" to 5 = "maximal or unable to do because of breathlessness")  Scoring Scores range from 0 to 120.  Minimally important difference is 5 units  CAT: CAT can identify the health impairment of COPD patients and is better correlated with disease progression.  CAT has a scoring range of zero to 40. The CAT  score is classified into four groups of low (less than 10), medium (10 - 20), high (21-30) and very high (31-40) based on the impact level of disease on health status. A CAT score over 10 suggests significant symptoms.  A worsening CAT score could be explained by an exacerbation, poor medication adherence, poor inhaler technique, or progression of COPD or comorbid conditions.  CAT MCID is 2 points  mMRC: mMRC (Modified Medical Research Council) Dyspnea Scale is used to assess the degree of baseline functional disability in patients of respiratory disease due to dyspnea. No minimal important difference is established. A decrease in score of 1 point or greater is considered a positive change.   Pulmonary Function Assessment:  Pulmonary Function Assessment - 12/05/20 1118       Breath   Shortness of Breath Yes;Limiting activity             Exercise Target Goals: Exercise Program Goal: Individual exercise prescription set using results from initial 6 min walk test and THRR while considering  patients activity barriers and safety.   Exercise Prescription Goal: Initial exercise prescription builds to 30-45 minutes a day of aerobic activity, 2-3 days per week.  Home exercise guidelines will be given to patient during program as part of exercise prescription that the participant will acknowledge.  Education: Aerobic Exercise: - Group verbal and visual presentation on the components of exercise prescription. Introduces F.I.T.T principle from ACSM for exercise prescriptions.  Reviews F.I.T.T. principles of aerobic exercise including progression. Written material given at graduation. Flowsheet  Row Pulmonary Rehab from 02/22/2021 in [facility] Cardiac and Pulmonary Rehab  Education need identified 01/10/21       Education: Resistance Exercise: - Group verbal and visual presentation on the components of exercise prescription. Introduces F.I.T.T principle from ACSM for exercise prescriptions  Reviews  F.I.T.T. principles of resistance exercise including progression. Written material given at graduation.    Education: Exercise & Equipment Safety: - Individual verbal instruction and demonstration of equipment use and safety with use of the equipment. Flowsheet Row Pulmonary Rehab from 12/05/2020 in [facility] Cardiac and Pulmonary Rehab  Date 12/05/20  Educator [facility]  Instruction Review Code 1- Verbalizes Understanding       Education: Exercise Physiology & General Exercise Guidelines: - Group verbal and written instruction with models to review the exercise physiology of the cardiovascular system and associated critical values. Provides general exercise guidelines with specific guidelines to those with heart or lung disease.    Education: Flexibility, Balance, Mind/Body Relaxation: - Group verbal and visual presentation with interactive activity on the components of exercise prescription. Introduces F.I.T.T principle from ACSM for exercise prescriptions. Reviews F.I.T.T. principles of flexibility and balance exercise training including progression. Also discusses the mind body connection.  Reviews various relaxation techniques to help reduce and manage stress (i.e. Deep breathing, progressive muscle relaxation, and visualization). Balance handout provided to take home. Written material given at graduation. Flowsheet Row Pulmonary Rehab from 02/22/2021 in [facility] Cardiac and Pulmonary Rehab  Date 01/25/21  Educator AS  Instruction Review Code 1- Verbalizes Understanding       Activity Barriers & Risk Stratification:   6 Minute Walk:  6 Minute Walk     Row Name 01/10/21 1142         6 Minute Walk   Phase Initial     Distance 165 feet     Walk Time 0 minutes     # of Rest Breaks 0     MPH 0.31     METS 0.17     RPE 13     Perceived Dyspnea  1     VO2 Peak 0.62     Symptoms Yes (comment)     Comments Legs fatigued     Resting HR 72 bpm     Resting BP 124/78     Resting Oxygen  Saturation  95 %     Exercise Oxygen Saturation  during 6 min walk 85 %     Max Ex. HR 106 bpm     Max Ex. BP 152/76     2 Minute Post BP 130/76       Interval HR   1 Minute HR 93     2 Minute HR 97     3 Minute HR 98     4 Minute HR 106     5 Minute HR 105     6 Minute HR 100     2 Minute Post HR 75     Interval Heart Rate? Yes       Interval Oxygen   Interval Oxygen? Yes     Baseline Oxygen Saturation % 95 %     1 Minute Oxygen Saturation % 90 %     1 Minute Liters of Oxygen 0 L  RA     2 Minute Oxygen Saturation % 87 %     2 Minute Liters of Oxygen 0 L     3 Minute Oxygen Saturation % 88 %     3 Minute Liters of Oxygen  0 L     4 Minute Oxygen Saturation % 89 %     4 Minute Liters of Oxygen 0 L     5 Minute Oxygen Saturation % 89 %     5 Minute Liters of Oxygen 0 L     6 Minute Oxygen Saturation % 85 %     6 Minute Liters of Oxygen 0 L     2 Minute Post Oxygen Saturation % 93 %     2 Minute Post Liters of Oxygen 0 L             Oxygen Initial Assessment:  Oxygen Initial Assessment - 02/01/21 1341       Home Oxygen   Home Oxygen Device Home Concentrator;Portable Concentrator    Sleep Oxygen Prescription BiPAP    Liters per minute 2    Home Exercise Oxygen Prescription None    Home Resting Oxygen Prescription None    Compliance with Home Oxygen Use Yes      Initial 6 min Walk   Oxygen Used None      Program Oxygen Prescription   Program Oxygen Prescription None      Intervention   Short Term Goals To learn and exhibit compliance with exercise, home and travel O2 prescription;To learn and understand importance of monitoring SPO2 with pulse oximeter and demonstrate accurate use of the pulse oximeter.;To learn and understand importance of maintaining oxygen saturations>88%;To learn and demonstrate proper pursed lip breathing techniques or other breathing techniques.     Long  Term Goals Exhibits compliance with exercise, home  and travel O2  prescription;Verbalizes importance of monitoring SPO2 with pulse oximeter and return demonstration;Maintenance of O2 saturations>88%;Exhibits proper breathing techniques, such as pursed lip breathing or other method taught during program session             Oxygen Re-Evaluation:  Oxygen Re-Evaluation     Row Name 01/23/21 1355 02/01/21 1341 02/20/21 1355         Program Oxygen Prescription   Program Oxygen Prescription None -- None       Home Oxygen   Home Oxygen Device Home Concentrator;Portable Concentrator -- Home Concentrator;Portable Concentrator     Sleep Oxygen Prescription BiPAP -- BiPAP     Liters per minute 2 -- 2     Home Exercise Oxygen Prescription None -- None     Home Resting Oxygen Prescription None -- None     Compliance with Home Oxygen Use Yes -- Yes       Goals/Expected Outcomes   Short Term Goals To learn and exhibit compliance with exercise, home and travel O2 prescription;To learn and understand importance of monitoring SPO2 with pulse oximeter and demonstrate accurate use of the pulse oximeter.;To learn and understand importance of maintaining oxygen saturations>88%;To learn and demonstrate proper pursed lip breathing techniques or other breathing techniques.  -- To learn and exhibit compliance with exercise, home and travel O2 prescription;To learn and understand importance of monitoring SPO2 with pulse oximeter and demonstrate accurate use of the pulse oximeter.;To learn and understand importance of maintaining oxygen saturations>88%;To learn and demonstrate proper pursed lip breathing techniques or other breathing techniques.      Long  Term Goals Exhibits compliance with exercise, home  and travel O2 prescription;Verbalizes importance of monitoring SPO2 with pulse oximeter and return demonstration;Maintenance of O2 saturations>88%;Exhibits proper breathing techniques, such as pursed lip breathing or other method taught during program session -- Exhibits  compliance with exercise, home  and travel O2  prescription;Verbalizes importance of monitoring SPO2 with pulse oximeter and return demonstration;Maintenance of O2 saturations>88%;Exhibits proper breathing techniques, such as pursed lip breathing or other method taught during program session     Comments Reviewed PLB technique with pt.  Talked about how it works and it's importance in maintaining their exercise saturations. Vivien Rota uses her pulse ox at home and is aware to have her oxygen stay above 88%. She knows how to practice PLB when needed and when she feels SOB. Vivien Rota uses her pulse ox at home and is aware to have her oxygen stay above 88%. She knows how to practice PLB when needed and when she feels shortness of breath, she feels this helps.     Goals/Expected Outcomes Short: Become more profiecient at using PLB.   Long: Become independent at using PLB. Short: Continue monitoring O2 at home Long: Become independent using PLB Short: Continue monitoring O2 at home Long: Become independent using PLB              Oxygen Discharge (Final Oxygen Re-Evaluation):  Oxygen Re-Evaluation - 02/20/21 1355       Program Oxygen Prescription   Program Oxygen Prescription None      Home Oxygen   Home Oxygen Device Home Concentrator;Portable Concentrator    Sleep Oxygen Prescription BiPAP    Liters per minute 2    Home Exercise Oxygen Prescription None    Home Resting Oxygen Prescription None    Compliance with Home Oxygen Use Yes      Goals/Expected Outcomes   Short Term Goals To learn and exhibit compliance with exercise, home and travel O2 prescription;To learn and understand importance of monitoring SPO2 with pulse oximeter and demonstrate accurate use of the pulse oximeter.;To learn and understand importance of maintaining oxygen saturations>88%;To learn and demonstrate proper pursed lip breathing techniques or other breathing techniques.     Long  Term Goals Exhibits compliance with exercise, home   and travel O2 prescription;Verbalizes importance of monitoring SPO2 with pulse oximeter and return demonstration;Maintenance of O2 saturations>88%;Exhibits proper breathing techniques, such as pursed lip breathing or other method taught during program session    Comments Vivien Rota uses her pulse ox at home and is aware to have her oxygen stay above 88%. She knows how to practice PLB when needed and when she feels shortness of breath, she feels this helps.    Goals/Expected Outcomes Short: Continue monitoring O2 at home Long: Become independent using PLB             Initial Exercise Prescription:  Initial Exercise Prescription - 01/10/21 1500       Date of Initial Exercise RX and Referring Provider   Date 01/10/21    Referring Provider Ottie Glazier MD      Oxygen   Maintain Oxygen Saturation 88% or higher      Recumbant Bike   Level 1    RPM 60    Minutes 15    METs 1      NuStep   Level 1    SPM 80    Minutes 15    METs 1      REL-XR   Level 1    Speed 50    Minutes 15    METs 1      Track   Laps 2   as tolerated   Minutes 15    METs 1      Prescription Details   Frequency (times per week) 2    Duration Progress  to 30 minutes of continuous aerobic without signs/symptoms of physical distress      Intensity   THRR 40-80% of Max Heartrate 101-131    Ratings of Perceived Exertion 11-13    Perceived Dyspnea 0-4      Progression   Progression Continue to progress workloads to maintain intensity without signs/symptoms of physical distress.      Resistance Training   Training Prescription Yes    Weight 3 lb    Reps 10-15             Perform Capillary Blood Glucose checks as needed.  Exercise Prescription Changes:   Exercise Prescription Changes     Row Name 01/10/21 1500 01/31/21 1500 02/14/21 1400 02/27/21 1000       Response to Exercise   Blood Pressure (Admit) 124/78 130/72 138/76 122/76    Blood Pressure (Exercise) 152/76 -- 144/76 134/66     Blood Pressure (Exit) 132/78 134/74 126/72 120/78    Heart Rate (Admit) 72 bpm 95 bpm 103 bpm 97 bpm    Heart Rate (Exercise) 106 bpm 99 bpm 106 bpm 103 bpm    Heart Rate (Exit) 75 bpm 92 bpm 93 bpm 100 bpm    Oxygen Saturation (Admit) 95 % 90 % 91 % 93 %    Oxygen Saturation (Exercise) 85 % 92 % 85 % 90 %    Oxygen Saturation (Exit) 94 % 93 % 94 % 93 %    Rating of Perceived Exertion (Exercise) _0 Perceived Dyspnea (Exercise) 1 1 0 0    Symptoms Legs fatigued none -- none    Comments walk test results -- -- --    Duration -- Progress to 30 minutes of  aerobic without signs/symptoms of physical distress Continue with 30 min of aerobic exercise without signs/symptoms of physical distress. Continue with 30 min of aerobic exercise without signs/symptoms of physical distress.    Intensity -- THRR unchanged THRR unchanged THRR unchanged      Progression   Progression -- Continue to progress workloads to maintain intensity without signs/symptoms of physical distress. Continue to progress workloads to maintain intensity without signs/symptoms of physical distress. Continue to progress workloads to maintain intensity without signs/symptoms of physical distress.    Average METs -- 1.25 1.35 1.75      Resistance Training   Training Prescription -- Yes Yes Yes    Weight -- 3 lb 3 lb 3 lb    Reps -- 10-15 10-15 10-15      Interval Training   Interval Training -- No No No      NuStep   Level -- 1 2 --    Minutes -- 15 30 --    METs -- 1.5 1.4 --      T5 Nustep   Level -- -- -- 1    Minutes -- -- -- 15    METs -- -- -- 1.8      Biostep-RELP   Level -- 1 -- 1    Minutes -- 15 -- 15    METs -- 1 -- 1      Oxygen   Maintain Oxygen Saturation -- -- 88% or higher 88% or higher             Exercise Comments:   Exercise Comments     Row Name 01/23/21 1354           Exercise Comments First full day of exercise!  Patient was oriented to  gym and equipment including  functions, settings, policies, and procedures.  Patient's individual exercise prescription and treatment plan were reviewed.  All starting workloads were established based on the results of the 6 minute walk test done at initial orientation visit.  The plan for exercise progression was also introduced and progression will be customized based on patient's performance and goals.                Exercise Goals and Review:   Exercise Goals     Row Name 01/10/21 1515             Exercise Goals   Increase Physical Activity Yes       Intervention Provide advice, education, support and counseling about physical activity/exercise needs.;Develop an individualized exercise prescription for aerobic and resistive training based on initial evaluation findings, risk stratification, comorbidities and participant's personal goals.       Expected Outcomes Short Term: Attend rehab on a regular basis to increase amount of physical activity.;Long Term: Add in home exercise to make exercise part of routine and to increase amount of physical activity.;Long Term: Exercising regularly at least 3-5 days a week.       Increase Strength and Stamina Yes       Intervention Provide advice, education, support and counseling about physical activity/exercise needs.;Develop an individualized exercise prescription for aerobic and resistive training based on initial evaluation findings, risk stratification, comorbidities and participant's personal goals.       Expected Outcomes Short Term: Increase workloads from initial exercise prescription for resistance, speed, and METs.;Short Term: Perform resistance training exercises routinely during rehab and add in resistance training at home;Long Term: Improve cardiorespiratory fitness, muscular endurance and strength as measured by increased METs and functional capacity (6MWT)       Able to understand and use rate of perceived exertion (RPE) scale Yes       Intervention Provide  education and explanation on how to use RPE scale       Expected Outcomes Short Term: Able to use RPE daily in rehab to express subjective intensity level;Long Term:  Able to use RPE to guide intensity level when exercising independently       Able to understand and use Dyspnea scale Yes       Intervention Provide education and explanation on how to use Dyspnea scale       Expected Outcomes Short Term: Able to use Dyspnea scale daily in rehab to express subjective sense of shortness of breath during exertion;Long Term: Able to use Dyspnea scale to guide intensity level when exercising independently       Knowledge and understanding of Target Heart Rate Range (THRR) Yes       Intervention Provide education and explanation of THRR including how the numbers were predicted and where they are located for reference       Expected Outcomes Short Term: Able to state/look up THRR;Long Term: Able to use THRR to govern intensity when exercising independently;Short Term: Able to use daily as guideline for intensity in rehab       Able to check pulse independently Yes       Intervention Provide education and demonstration on how to check pulse in carotid and radial arteries.;Review the importance of being able to check your own pulse for safety during independent exercise       Expected Outcomes Short Term: Able to explain why pulse checking is important during independent exercise;Long Term: Able to check pulse independently and accurately  Understanding of Exercise Prescription Yes       Intervention Provide education, explanation, and written materials on patient's individual exercise prescription       Expected Outcomes Short Term: Able to explain program exercise prescription;Long Term: Able to explain home exercise prescription to exercise independently                Exercise Goals Re-Evaluation :  Exercise Goals Re-Evaluation     Row Name 01/23/21 1354 01/31/21 1526 02/01/21 1339 02/14/21  1406 02/20/21 1347     Exercise Goal Re-Evaluation   Exercise Goals Review Able to understand and use rate of perceived exertion (RPE) scale;Increase Physical Activity;Knowledge and understanding of Target Heart Rate Range (THRR);Understanding of Exercise Prescription;Increase Strength and Stamina;Able to understand and use Dyspnea scale;Able to check pulse independently Increase Physical Activity;Increase Strength and Stamina Increase Physical Activity;Increase Strength and Stamina Increase Physical Activity;Increase Strength and Stamina;Understanding of Exercise Prescription Increase Physical Activity;Increase Strength and Stamina;Understanding of Exercise Prescription   Comments Reviewed RPE and dyspnea scales, THR and program prescription with pt today.  Pt voiced understanding and was given a copy of goals to take home. Vivien Rota is just getting started with her exercise program.  She almost reaches her THR range.  Staff will monitor progress. EP will hold off on doing home exercise with patient until patient is further into the program. She is a member at [facility] and she plans to go there once she is more in habit of rehab with exercise. I encouraged her to keep moving and start home exercise as soon as she feels ready. We talked about starting with 1 day at home. Vivien Rota is doing well in rehab.  She is getting in her full 30 min on the NuStep at level 2!  We will conitnue to montior her progress. --   Expected Outcomes Short: Use RPE daily to regulate intensity. Long: Follow program prescription in THR. Short: attend consistently Long:  improve overall stamina Short: Go over home exercise Long: Exercise independently at home at appropriate prescription short: Improve walking more Long; Conitnue to improve stamina --    Row Name 02/20/21 1348 02/27/21 1039           Exercise Goal Re-Evaluation   Exercise Goals Review Increase Physical Activity;Increase Strength and Stamina;Understanding of  Exercise Prescription Increase Physical Activity;Increase Strength and Stamina;Understanding of Exercise Prescription      Comments Vivien Rota did not get home exercise done yet due to exercise level. Per last EP note: "She is a member at [facility] and she plans to go there once she is more in habit of rehab with exercise. I encouraged her to keep moving and start home exercise as soon as she feels ready. We talked about starting with 1 day at home." She has been more active at home and deep cleaned her house recently and she felt like she was very worn out afterwards, but reports not knowing how to pace herself. Encourged her to keep active, but not to push herself. We talked to Vivien Rota about doing PT prior to finishing rehab so that she would be able to walk more in class and move around easier.  She seemed okay with it on Wednesday when we talked with her, but she called out on Thursday saying that it really upset her.  We discussed situation with her physcian and Dr. Lanney Gins agreed that PT may be a better fit to start.  We will again discuss with Vivien Rota today when  she comes.  She is still on level 1 on all equipment and stays put for her 30 min of exercise.  She has a difficult time moving to from chair to scale and chair to equipment.      Expected Outcomes short: continue to stay active at least 1 day per week outside of rehab, EP to go over home exercise when pt is ready Long; Conitnue to improve stamina Short: Follow up with patient on PT referral Long: Build up strength for rehab.               Discharge Exercise Prescription (Final Exercise Prescription Changes):  Exercise Prescription Changes - 02/27/21 1000       Response to Exercise   Blood Pressure (Admit) 122/76    Blood Pressure (Exercise) 134/66    Blood Pressure (Exit) 120/78    Heart Rate (Admit) 97 bpm    Heart Rate (Exercise) 103 bpm    Heart Rate (Exit) 100 bpm    Oxygen Saturation (Admit) 93 %    Oxygen Saturation  (Exercise) 90 %    Oxygen Saturation (Exit) 93 %    Rating of Perceived Exertion (Exercise) 11    Perceived Dyspnea (Exercise) 0    Symptoms none    Duration Continue with 30 min of aerobic exercise without signs/symptoms of physical distress.    Intensity THRR unchanged      Progression   Progression Continue to progress workloads to maintain intensity without signs/symptoms of physical distress.    Average METs 1.75      Resistance Training   Training Prescription Yes    Weight 3 lb    Reps 10-15      Interval Training   Interval Training No      T5 Nustep   Level 1    Minutes 15    METs 1.8      Biostep-RELP   Level 1    Minutes 15    METs 1      Oxygen   Maintain Oxygen Saturation 88% or higher             Nutrition:  Target Goals: Understanding of nutrition guidelines, daily intake of sodium <1594m, cholesterol <2024m calories 30% from fat and 7% or less from saturated fats, daily to have 5 or more servings of fruits and vegetables.  Education: All About Nutrition: -Group instruction provided by verbal, written material, interactive activities, discussions, models, and posters to present general guidelines for heart healthy nutrition including fat, fiber, MyPlate, the role of sodium in heart healthy nutrition, utilization of the nutrition label, and utilization of this knowledge for meal planning. Follow up email sent as well. Written material given at graduation. [facility]  Date 02/01/21  [facility] Code 1- Verbalizes Understanding       Biometrics:    Nutrition Therapy Plan and Nutrition Goals:  Nutrition Therapy & Goals - 01/10/21 1516       Intervention Plan   Intervention Prescribe, educate and counsel regarding individualized specific dietary modifications aiming towards targeted core components such as weight, hypertension, lipid management, diabetes,  heart failure and other comorbidities.    Expected Outcomes Short Term Goal: Understand basic principles of dietary content, such as calories, fat, sodium, cholesterol and nutrients.;Short Term Goal: A plan has been developed with personal nutrition goals set during dietitian appointment.;Long Term Goal: Adherence to prescribed nutrition plan.  Nutrition Assessments:  MEDIFICTS Score Key: ?70 Need to make dietary changes  40-70 Heart Healthy Diet ? 40 Therapeutic Level Cholesterol Diet  Flowsheet Row Pulmonary Rehab from 01/10/2021 in [facility] and Pulmonary Rehab  Picture Your Plate Total Score on Admission 76      Picture Your Plate Scores: <62 Unhealthy dietary pattern with much room for improvement. 41-50 Dietary pattern unlikely to meet recommendations for good health and room for improvement. 51-60 More healthful dietary pattern, with some room for improvement.  >60 Healthy dietary pattern, although there may be some specific behaviors that could be improved.   Nutrition Goals Re-Evaluation:  Nutrition Goals Re-Evaluation     [place] Name 02/01/21 1519 02/20/21 1343           Goals   Comment Vivien Rota has not met with the RD yet. She has an appointment coming up. Nutrition appointment rescheduled for 12/12.      Expected Outcome Short: Meet with RD Long: Follow goals established by RD Short: Meet with RD Long: Follow goals established by RD               Nutrition Goals Discharge (Final Nutrition Goals Re-Evaluation):  Nutrition Goals Re-Evaluation - 02/20/21 1343       Goals   Comment Nutrition appointment rescheduled for 12/12.    Expected Outcome Short: Meet with RD Long: Follow goals established by RD             Psychosocial: Target Goals: Acknowledge presence or absence of significant depression and/or stress, maximize coping skills, provide positive support system. Participant is able to verbalize types and ability to use techniques and  skills needed for reducing stress and depression.   Education: Stress, Anxiety, and Depression - Group verbal and visual presentation to define topics covered.  Reviews how body is impacted by stress, anxiety, and depression.  Also discusses healthy ways to reduce stress and to treat/manage anxiety and depression.  Written material given at graduation.   Education: Sleep Hygiene -Provides group verbal and written instruction about how sleep can affect your health.  Define sleep hygiene, discuss sleep cycles and impact of sleep habits. Review good sleep hygiene tips.    Initial Review & Psychosocial Screening:  Initial Psych Review & Screening - 12/05/20 1119       Initial Review   Current issues with Current Sleep Concerns;Current Stress Concerns    Source of Stress Concerns Chronic Illness;Unable to participate in former interests or hobbies    Comments Landra states that she is not depressed but is having a hard time coping with her legs being weak. She can look to her husband, two children and grand children that she can depend on.      Family Dynamics   Good Support System? Yes      Barriers   Psychosocial barriers to participate in program The patient should benefit from training in stress management and relaxation.      Screening Interventions   Interventions To provide support and resources with identified psychosocial needs;Provide feedback about the scores to participant;Encouraged to exercise    Expected Outcomes Short Term goal: Utilizing psychosocial counselor, staff and physician to assist with identification of specific Stressors or current issues interfering with healing process. Setting desired goal for each stressor or current issue identified.;Long Term Goal: Stressors or current issues are controlled or eliminated.;Short Term goal: Identification and review with participant of any Quality of Life or Depression concerns found by scoring the questionnaire.;Long Term goal:  The participant improves quality of Life and PHQ9 Scores as seen by post scores and/or verbalization of changes             Quality of Life Scores:  Scores of 19 and below usually indicate a poorer quality of life in these areas.  A difference of  2-3 points is a clinically meaningful difference.  A difference of 2-3 points in the total score of the Quality of Life Index has been associated with significant improvement in overall quality of life, self-image, physical symptoms, and general health in studies assessing change in quality of life.  PHQ-9: Recent Review Flowsheet Data     Depression screen [facility] 2/9 01/10/2021   Decreased Interest 0   Down, Depressed, Hopeless 0   PHQ - 2 Score 0   Altered sleeping 1   Tired, decreased energy 1   Change in appetite 0   Feeling bad or failure about yourself  1   Trouble concentrating 0   Moving slowly or fidgety/restless 0   Suicidal thoughts 0   PHQ-9 Score 3      Interpretation of Total Score  Total Score Depression Severity:  1-4 = Minimal depression, 5-9 = Mild depression, 10-14 = Moderate depression, 15-19 = Moderately severe depression, 20-27 = Severe depression   Psychosocial Evaluation and Intervention:  Psychosocial Evaluation - 12/05/20 1122       Psychosocial Evaluation & Interventions   Interventions Encouraged to exercise with the program and follow exercise prescription;Relaxation education;Stress management education    Comments Jenisis states that she is not depressed but is having a hard time coping with her legs being weak. She can look to her husband, two children and grand children that she can depend on.    Expected Outcomes Short: Start [facility] to help with mood. Long: Maintain a healthy mental state.    Continue Psychosocial Services  Follow up required by staff             Psychosocial Re-Evaluation:  Psychosocial Re-Evaluation     [place] Name 02/01/21 2122 02/20/21 1352           Psychosocial  Re-Evaluation   Current issues with Current Stress Concerns Current Stress Concerns      Comments Vivien Rota is holding up well mentally. Her leg weakness and arthritis are her biggest problems that limits her from moving and is frustrating for her. She is not traveling to her family in [place] due to her medical problems and is a little upset by it.  She is enjoying [facility] thus far and is thinking it'll help her tremendously. She takes Alprazolam is as needed and has not needed to take it in while. She reports no major stressors, but it causes her stress to not be able to do the things she used to do due to her legs - it is hard for her to get around. She gets frustrated with how long it takes her to do things. She enjoys to read ([place] [employer]), she is also doing a bible study. She relies on her husband and children as support for her. She reports sleeping well and very rarely has a sleepless night.      Expected Outcomes Short: Continue attendance with rehab Long: Continue to maintain posititive attitude and utilize exercise for stress management Short: Continue attendance with rehab Long: Continue to maintain posititive attitude and utilize exercise for stress management      Interventions Encouraged to attend Pulmonary Rehabilitation for the exercise Encouraged  to attend Pulmonary Rehabilitation for the exercise      Continue Psychosocial Services  Follow up required by staff Follow up required by staff        Initial Review   Source of Stress Concerns -- Chronic Illness;Unable to participate in former interests or hobbies               Psychosocial Discharge (Final Psychosocial Re-Evaluation):  Psychosocial Re-Evaluation - 02/20/21 1352       Psychosocial Re-Evaluation   Current issues with Current Stress Concerns    Comments She reports no major stressors, but it causes her stress to not be able to do the things she used to do due to her legs - it is hard for her to get around.  She gets frustrated with how long it takes her to do things. She enjoys to read ([place] [employer] [address]), she is also doing a bible study. She relies on her husband and children as support for her. She reports sleeping well and very rarely has a sleepless night.    Expected Outcomes Short: Continue attendance with rehab Long: Continue to maintain posititive attitude and utilize exercise for stress management    Interventions Encouraged to attend Pulmonary Rehabilitation for the exercise    Continue Psychosocial Services  Follow up required by staff      Initial Review   Source of Stress Concerns Chronic Illness;Unable to participate in former interests or hobbies             Education: Education Goals: Education classes will be provided on a weekly basis, covering required topics. Participant will state understanding/return demonstration of topics presented.  Learning Barriers/Preferences:  Learning Barriers/Preferences - 12/05/20 1118       Learning Barriers/Preferences   Learning Barriers None    Learning Preferences None             General Pulmonary Education Topics:  Infection Prevention: - Provides verbal and written material to individual with discussion of infection control including proper hand washing and proper equipment cleaning during exercise session. Flowsheet Row Pulmonary Rehab from 12/05/2020 in [facility] Cardiac and Pulmonary Rehab  Date 12/05/20  Educator [facility]  Instruction Review Code 1- Verbalizes Understanding       Falls Prevention: - Provides verbal and written material to individual with discussion of falls prevention and safety. Flowsheet Row Pulmonary Rehab from 12/05/2020 in [facility] Cardiac and Pulmonary Rehab  Date 12/05/20  Educator [facility]  Instruction Review Code 1- Verbalizes Understanding       Chronic Lung Disease Review: - Group verbal instruction with posters, models, PowerPoint presentations and videos,  to review new updates, new  respiratory medications, new advancements in procedures and treatments. Providing information on websites and "800" numbers for continued self-education. Includes information about supplement oxygen, available portable oxygen systems, continuous and intermittent flow rates, oxygen safety, concentrators, and Medicare reimbursement for oxygen. Explanation of Pulmonary Drugs, including class, frequency, complications, importance of spacers, rinsing mouth after steroid MDI's, and proper cleaning methods for nebulizers. Review of basic lung anatomy and physiology related to function, structure, and complications of lung disease. Review of risk factors. Discussion about methods for diagnosing sleep apnea and types of masks and machines for OSA. Includes a review of the use of types of environmental controls: home humidity, furnaces, filters, dust mite/pet prevention, HEPA vacuums. Discussion about weather changes, air quality and the benefits of nasal washing. Instruction on Warning signs, infection symptoms, calling MD promptly, preventive modes, and value of vaccinations.  Review of effective airway clearance, coughing and/or vibration techniques. Emphasizing that all should Create an Action Plan. Written material given at graduation. Flowsheet Row Pulmonary Rehab from 02/22/2021 in [facility] Cardiac and Pulmonary Rehab  Education need identified 01/10/21  Date 02/22/21  Educator [facility]  Instruction Review Code 1- Verbalizes Understanding       AED/CPR: - Group verbal and written instruction with the use of models to demonstrate the basic use of the AED with the basic ABC's of resuscitation.    Anatomy and Cardiac Procedures: - Group verbal and visual presentation and models provide information about basic cardiac anatomy and function. Reviews the testing methods done to diagnose heart disease and the outcomes of the test results. Describes the treatment choices: Medical Management, Angioplasty, or Coronary Bypass  Surgery for treating various heart conditions including Myocardial Infarction, Angina, Valve Disease, and Cardiac Arrhythmias.  Written material given at graduation.   Medication Safety: - Group verbal and visual instruction to review commonly prescribed medications for heart and lung disease. Reviews the medication, class of the drug, and side effects. Includes the steps to properly store meds and maintain the prescription regimen.  Written material given at graduation.   Other: -Provides group and verbal instruction on various topics (see comments)   Knowledge Questionnaire Score:  Knowledge Questionnaire Score - 01/10/21 1122       Knowledge Questionnaire Score   Pre Score 16/18: Exercise, O2              Core Components/Risk Factors/Patient Goals at Admission:  Personal Goals and Risk Factors at Admission - 01/10/21 1516       Core Components/Risk Factors/Patient Goals on Admission    Weight Management Yes;Weight Loss    Intervention Weight Management: Develop a combined nutrition and exercise program designed to reach desired caloric intake, while maintaining appropriate intake of nutrient and fiber, sodium and fats, and appropriate energy expenditure required for the weight goal.;Weight Management: Provide education and appropriate resources to help participant work on and attain dietary goals.;Weight Management/Obesity: Establish reasonable short term and long term weight goals.    Admit Weight 232 lb (105.2 kg)    Goal Weight: Short Term 227 lb (103 kg)    Goal Weight: Long Term 200 lb (90.7 kg)    Expected Outcomes Short Term: Continue to assess and modify interventions until short term weight is achieved;Long Term: Adherence to nutrition and physical activity/exercise program aimed toward attainment of established weight goal;Weight Loss: Understanding of general recommendations for a balanced deficit meal plan, which promotes 1-2 lb weight loss per week and includes a  negative energy balance of [phone] kcal/d;Understanding recommendations for meals to include 15-35% energy as protein, 25-35% energy from fat, 35-60% energy from carbohydrates, less than 255m of dietary cholesterol, 20-35 gm of total fiber daily;Understanding of distribution of calorie intake throughout the day with the consumption of 4-5 meals/snacks    Improve shortness of breath with ADL's Yes    Intervention Provide education, individualized exercise plan and daily activity instruction to help decrease symptoms of SOB with activities of daily living.    Expected Outcomes Short Term: Improve cardiorespiratory fitness to achieve a reduction of symptoms when performing ADLs;Long Term: Be able to perform more ADLs without symptoms or delay the onset of symptoms    Heart Failure Yes    Intervention Provide a combined exercise and nutrition program that is supplemented with education, support and counseling about heart failure. Directed toward relieving symptoms such as shortness of breath, decreased  exercise tolerance, and extremity edema.    Expected Outcomes Improve functional capacity of life;Short term: Attendance in program 2-3 days a week with increased exercise capacity. Reported lower sodium intake. Reported increased fruit and vegetable intake. Reports medication compliance.;Short term: Daily weights obtained and reported for increase. Utilizing diuretic protocols set by physician.;Long term: Adoption of self-care skills and reduction of barriers for early signs and symptoms recognition and intervention leading to self-care maintenance.    Hypertension Yes    Intervention Provide education on lifestyle modifcations including regular physical activity/exercise, weight management, moderate sodium restriction and increased consumption of fresh fruit, vegetables, and low fat dairy, alcohol moderation, and smoking cessation.;Monitor prescription use compliance.    Expected Outcomes Short Term:  Continued assessment and intervention until BP is < 140/34m HG in hypertensive participants. < 130/842mHG in hypertensive participants with diabetes, heart failure or chronic kidney disease.;Long Term: Maintenance of blood pressure at goal levels.             Education:Diabetes - Individual verbal and written instruction to review signs/symptoms of diabetes, desired ranges of glucose level fasting, after meals and with exercise. Acknowledge that pre and post exercise glucose checks will be done for 3 sessions at entry of program.   Know Your Numbers and Heart Failure: - Group verbal and visual instruction to discuss disease risk factors for cardiac and pulmonary disease and treatment options.  Reviews associated critical values for Overweight/Obesity, Hypertension, Cholesterol, and Diabetes.  Discusses basics of heart failure: signs/symptoms and treatments.  Introduces Heart Failure Zone chart for action plan for heart failure.  Written material given at graduation. Flow[facility]  Date 02/15/21  [facility]ruction Review Code 1- Verbalizes Understanding       Core Components/Risk Factors/Patient Goals Review:   Goals and Risk Factor Review     Row Name 02/01/21 1345 02/20/21 1344           Core Components/Risk Factors/Patient Goals Review   Personal Goals Review Hypertension;Weight Management/Obesity;Heart Failure Hypertension;Weight Management/Obesity;Heart Failure      Review ToVivien Rotas focusing more on portion control, drinking more water, and reducing her snacks to help her lose weight which is still a goal of hers. ToVivien Rotas weighing herself at home daily and is aware to note if she gains a lot of weight in a short period of time. Denies any heart failure symptoms at this time. She checks her BP at home and is running [id number] and 7084-16Siastolic. Her BPs at rehab are good as well. ToVivien Rotaas not met  with RD yet, new appointment scheduled for 12/12. ToVivien Rotas still weighing herself daily reporting no major changes - reminded to take note of major weight changes in short period of time. She continues to check her BP at home when she remembers, every couple of weeks. Her BP today was 138/78.      Expected Outcomes Short: Continue working towards weight loss Long: Manage life style risk factors ST: check BP regularly at home (when not at rehab) LT: manage lifestyle risk factors               Core Components/Risk Factors/Patient Goals at Discharge (Final Review):   Goals and Risk Factor Review - 02/20/21 1344       Core Components/Risk Factors/Patient Goals Review   Personal Goals Review Hypertension;Weight Management/Obesity;Heart Failure    Review ToVivien Rotaas not met with RD yet, new appointment scheduled  for 12/12. Vivien Rota is still weighing herself daily reporting no major changes - reminded to take note of major weight changes in short period of time. She continues to check her BP at home when she remembers, every couple of weeks. Her BP today was 138/78.    Expected Outcomes ST: check BP regularly at home (when not at rehab) LT: manage lifestyle risk factors             ITP Comments:  ITP Comments     Row Name 12/05/20 1115 12/05/20 1125 01/10/21 1114 01/18/21 0648 01/23/21 1354   ITP Comments Virtual Visit completed. Patient informed on EP and RD appointment and 6 Minute walk test. Patient also informed of patient health questionnaires on My Chart. Patient Verbalizes understanding. Visit diagnosis can be found in [facility] 11/18/2020. Patient is to call back when she gets out of her boot. She had broke her ankle from a fall and will call back in two weeks for clearance to exercise. Completed 6MWT and gym orientation. Initial ITP created and sent for review to Dr. Ottie Glazier, Medical Director. 30 Day review completed. Medical Director ITP review done, changes made as directed, and signed  approval by Medical Director.   New to program First full day of exercise!  Patient was oriented to gym and equipment including functions, settings, policies, and procedures.  Patient's individual exercise prescription and treatment plan were reviewed.  All starting workloads were established based on the results of the 6 minute walk test done at initial orientation visit.  The plan for exercise progression was also introduced and progression will be customized based on patient's performance and goals.    Lorenzo Name 02/15/21 0641 03/06/21 1421 03/15/21 0701 04/04/21 1533 04/12/21 0836   ITP Comments 30 Day review completed. Medical Director ITP review done, changes made as directed, and signed approval by Medical Director.    New to program Vivien Rota is now on medical hold for PT to improve strength and safety before finishing her rehab. 30 Day review completed. Medical Director ITP review done, changes made as directed, and signed approval by Medical Director. Vivien Rota is still working with PT and making good progress.  We will continue to keep her out on hold until her f/u with Dr. Lanney Gins next month. 30 Day review completed. Medical Director ITP review done, changes made as directed, and signed approval by Medical Director.   out for medical reasons    Row Name 05/10/21 0815           ITP Comments 30 Day review completed. Medical Director ITP review done, changes made as directed, and signed approval by Medical Director.   remains out this month                Comments:

## 2021-05-11 ENCOUNTER — Encounter: Payer: Medicare HMO | Admitting: Physical Therapy

## 2021-05-11 ENCOUNTER — Encounter: Payer: Self-pay | Admitting: Physical Therapy

## 2021-05-12 NOTE — Therapy (Signed)
[facility] [address] [place], [name], [address] Phone: [phone]   Fax:  [phone]  Physical Therapy Treatment  Patient Details  Name: Hannah Powell MRN: [id number] Date of Birth: Oct 09, 1945 Referring Provider (PT): Dr. Ottie Glazier   Encounter Date: 05/10/2021   PT End of Session - 05/11/21 1307     Visit Number 18    Number of Visits 22    Date for PT Re-Evaluation 05/25/21    Authorization - Visit Number 8    Authorization - Number of Visits 10    PT Start Time 1300    PT Stop Time 1351    PT Time Calculation (min) 51 min    Activity Tolerance Patient tolerated treatment well;Patient limited by fatigue    Behavior During Therapy [facility] for tasks assessed/performed             Past Medical History:  Diagnosis Date   Anxiety    Arthritis    CHF (congestive heart failure) (HCC)    COPD (chronic obstructive pulmonary disease) ([place])    Dysplastic nevus 04/22/2019   R abdomen parallel to sup umbilicus - mod   Dysplastic nevus 04/22/2019   R low back above waistline - mild    Edema    feet/legs   Hypertension    dr Otho Najjar      Hypothyroidism    Leg weakness, bilateral    Osteoporosis    Oxygen deficit    2l with bipap at night   RLS (restless legs syndrome)    Shortness of breath    Sleep apnea    bipap   Wheezing     Past Surgical History:  Procedure Laterality Date   BACK SURGERY     cervical   BREAST BIOPSY Left    needle bx-neg   CATARACT EXTRACTION W/PHACO Left 05/07/2017   Procedure: CATARACT EXTRACTION PHACO AND INTRAOCULAR LENS PLACEMENT ([place]);  Surgeon: Birder Robson, MD;  Location: [facility] ORS;  Service: Ophthalmology;  Laterality: Left;  [place] 00:48.7 AP% 11.3 CDE 5.46 Fluid Pack Lot # [id number]   CATARACT EXTRACTION W/PHACO Right 04/23/2017   Procedure: CATARACT EXTRACTION PHACO AND INTRAOCULAR LENS PLACEMENT ([facility]);  Surgeon: Birder Robson, MD;  Location: [facility];  Service:  Ophthalmology;  Laterality: Right;  [place] 00:29 AP% 12.8 CDE 3.79 FLUID PACK LOT # [id number]   FOOT ARTHROPLASTY     JOINT REPLACEMENT     x2 tkr   TOTAL SHOULDER ARTHROPLASTY Left 07/02/2013   Procedure: LEFT TOTAL SHOULDER ARTHROPLASTY;  Surgeon: Marin Shutter, MD;  Location: [place];  Service: Orthopedics;  Laterality: Left;   TOTAL SHOULDER ARTHROPLASTY Right 04/10/2018   Procedure: TOTAL REVERSE SHOULDER ARTHROPLASTY;  Surgeon: Justice Britain, MD;  Location: [facility];  Service: Orthopedics;  Laterality: Right;  164mn    There were no vitals filed for this visit.   Subjective Assessment - 05/11/21 1249     Subjective Pt. reports no new complaints.  Pt. reports no pain and O2 saturation after walking into PT was 97%.    Pertinent History extensive PMH.  Pt. was participating with Pulmonary Rehab and pt. states that she is hoping to return to Pulmonary Rehab when appropriate.  Pt. states they were concerned about her LE strength/ balance getting on/off equipment.    Limitations Standing;Walking;House hold activities    Patient Stated Goals Increase B LE muscle strength/ return to Pulmonary Rehab    Currently in Pain? No/denies  Ther.ex.:   Nustep L4 Seat 10 for 12 min. With B UE/LE- consistent cadence.   Standing shoulder flexion 20x with mirror feedback for posture correction.  Controlled stand to sit in chair with limited UE assist.        Seated/ standing ex.: LAQ/ marching/ hip flexion/ heel raises 20x in //-bars for safety.         Neuro:   Walking in //-bars: marching/ lateral walking 3 laps each.  Cuing for posture.  Pt. Requires frequent seated rest breaks.  O2 sat. Remained higher today (>95%)   6" step touches (alternate) with B UE assist in //-bars.  Moderate LE fatigue/ requested seated rest break.      Sit to stands from gray chair with 2 inch Airex pad (5x)- fatigue.  Requires L UE assist on arm rest for support/ proper technique.       Standing  ball toss in //-bars (CGA for safety).     Walking in clinic/ outside to car with use of RW.         PT Long Term Goals - 04/28/21 1942       PT LONG TERM GOAL #1   Title Pt. will improve 5XSTS to <15 sec to improve functional mobility.    Baseline >20 sec.    Time 4    Period Weeks    Status Partially Met    Target Date 05/25/21      PT LONG TERM GOAL #2   Title Pt. will increase B LE muscle strength 1/2 muscle grade to improve safety/ independence with functional mobility.    Baseline neurogenic claudication. Pt. presents with good strength in bilat. hip add and abd, bilat DF, and quadriceps. Pt. has 4/5 strength in B hip flex, 4+/5 in R knee flex, and R inversion. Pt. has 4/5 strength with L knee flex, R eversion, and L inverison.    Time 4    Period Weeks    Status Partially Met    Target Date 05/25/21      PT LONG TERM GOAL #3   Title Pt. will demonstrate improved normal gait speed to 0.4 m/s to decrease falls risk and improve ability to safely walk in community.    Time 4    Period Weeks    Status Not Met    Target Date 05/25/21      PT LONG TERM GOAL #4   Title Pt. will demonstrate safety with ascending 4 stairs with at most one handrail to improve community ambulation.    Baseline Pt able to ascend/ descend stairs with both handrails and lateral step to pattern.    Time 4    Period Weeks    Status Partially Met    Target Date 05/25/21                   Plan - 05/11/21 1308     Clinical Impression Statement Pt. requires several seated rest breaks between standing/dynamic tasks secondary to fatigue.  Pts. O2 sat. and HR remained WNL t/o session.  UE assist required during all standing ther.ex./ dynamic tasks in //-bars for safety and fear of knee buckling.  Pt. able to complete 3 laps in //-bars consistently during tx. session prior to seated rest break.  Limited ability to stand from gray chair without UE assist and able to complete transfer with mod.  independence and L UE assist.  No episodes of knee buckling t/o tx. and PT assisted pt. to car with  use of rollator for safety.  No change to HEP.    Personal Factors and Comorbidities Age;Comorbidity 3+    Comorbidities CPD, HTN, Obesity    Stability/Clinical Decision Making Evolving/Moderate complexity    Clinical Decision Making Moderate    Rehab Potential Fair    PT Frequency 2x / week    PT Duration 8 weeks    PT Treatment/Interventions Cryotherapy;Electrical Stimulation;Ultrasound;Moist Heat;Gait training;Functional mobility training;Therapeutic activities;Therapeutic exercise;Neuromuscular re-education;Scar mobilization;Balance training;Stair training;Patient/family education;Manual techniques;ADLs/Self Care Home Management    PT Next Visit Plan Dynamic balance tasks/ outside walking on ramp/ curb.    Consulted and Agree with Plan of Care Patient             Patient will benefit from skilled therapeutic intervention in order to improve the following deficits and impairments:  Decreased endurance, Decreased range of motion, Decreased skin integrity, Decreased strength, Hypomobility, Impaired UE functional use, Increased fascial restricitons, Pain, Postural dysfunction, Impaired flexibility, Decreased scar mobility, Decreased mobility, Decreased balance, Improper body mechanics, Abnormal gait, Decreased activity tolerance, Impaired sensation  Visit Diagnosis: Muscle weakness (generalized)  Gait difficulty  Abnormal posture  Imbalance     Problem List Patient Active Problem List   Diagnosis Date Noted   S/P reverse total shoulder arthroplasty, 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   Chronic diastolic heart failure ([place]) 08/09/2016   COPD (chronic obstructive pulmonary disease) ([place]) 08/09/2016   Pulmonary infiltrates    Pneumonia due to Streptococcus pneumoniae ([place])    Fever    Community acquired pneumonia of right lung    Palliative care by specialist    Goals of care,  counseling/discussion    Pressure injury of skin 07/03/2016   COPD exacerbation ([place]) 07/21/2015   Acute renal insufficiency 07/21/2015   Hyperglycemia 07/21/2015   Benign essential HTN 07/21/2015   S/P shoulder replacement 07/02/2013   Pura Spice, PT, DPT # [id number] 05/12/2021, 11:23 AM  [facility] [address] [place], [name], [address] Phone: [phone]   Fax:  [phone]  Name: Hannah Powell MRN: [id number] Date of Birth: 01-26-1946

## 2021-05-15 ENCOUNTER — Ambulatory Visit: Payer: Medicare HMO | Admitting: Physical Therapy

## 2021-05-15 ENCOUNTER — Encounter: Payer: Self-pay | Admitting: Physical Therapy

## 2021-05-15 ENCOUNTER — Other Ambulatory Visit: Payer: Self-pay

## 2021-05-15 ENCOUNTER — Encounter: Payer: Self-pay | Admitting: *Deleted

## 2021-05-15 DIAGNOSIS — R269 Unspecified abnormalities of gait and mobility: Secondary | ICD-10-CM

## 2021-05-15 DIAGNOSIS — M6281 Muscle weakness (generalized): Secondary | ICD-10-CM

## 2021-05-15 DIAGNOSIS — R293 Abnormal posture: Secondary | ICD-10-CM

## 2021-05-15 DIAGNOSIS — R2689 Other abnormalities of gait and mobility: Secondary | ICD-10-CM

## 2021-05-15 DIAGNOSIS — J449 Chronic obstructive pulmonary disease, unspecified: Secondary | ICD-10-CM

## 2021-05-15 NOTE — Progress Notes (Signed)
Discharge Progress Report  Patient Details  Name: Hannah Powell MRN: [id number] Date of Birth: 20-Mar-1945 Referring Provider:   Flowsheet Row Pulmonary Rehab from 01/10/2021 in [facility]  Referring Provider Ottie Glazier MD        Number of Visits: 12  Reason for Discharge:  Early Exit:  Attending PT and then going to gym independently  Smoking History:  Social History   Tobacco Use  Smoking Status Former   Packs/day: 1.00   Years: 10.00   Pack years: 10.00   Types: Cigarettes   Quit date: 03/20/1975   Years since quitting: 46.1  Smokeless Tobacco Never    Diagnosis:  Chronic obstructive pulmonary disease, unspecified COPD type ([place])  ADL UCSD:  Pulmonary Assessment Scores     Row Name 01/10/21 1126         ADL UCSD   ADL Phase Entry     SOB Score total 11     Rest 0     Walk 1     Stairs 0  didn't complete as she stated she does not do them     Bath 0     Dress 1     Shop 1       CAT Score   CAT Score 9       mMRC Score   mMRC Score 0              Initial Exercise Prescription:  Initial Exercise Prescription - 01/10/21 1500       Date of Initial Exercise RX and Referring Provider   Date 01/10/21    Referring Provider Ottie Glazier MD      Oxygen   Maintain Oxygen Saturation 88% or higher      Recumbant Bike   Level 1    RPM 60    Minutes 15    METs 1      NuStep   Level 1    SPM 80    Minutes 15    METs 1      REL-XR   Level 1    Speed 50    Minutes 15    METs 1      Track   Laps 2   as tolerated   Minutes 15    METs 1      Prescription Details   Frequency (times per week) 2    Duration Progress to 30 minutes of continuous aerobic without signs/symptoms of physical distress      Intensity   THRR 40-80% of Max Heartrate 101-131    Ratings of Perceived Exertion 11-13    Perceived Dyspnea 0-4      Progression   Progression Continue to progress workloads to maintain intensity without  signs/symptoms of physical distress.      Resistance Training   Training Prescription Yes    Weight 3 lb    Reps 10-15             Discharge Exercise Prescription (Final Exercise Prescription Changes):  Exercise Prescription Changes - 02/27/21 1000       Response to Exercise   Blood Pressure (Admit) 122/76    Blood Pressure (Exercise) 134/66    Blood Pressure (Exit) 120/78    Heart Rate (Admit) 97 bpm    Heart Rate (Exercise) 103 bpm    Heart Rate (Exit) 100 bpm    Oxygen Saturation (Admit) 93 %    Oxygen Saturation (Exercise) 90 %    Oxygen  Saturation (Exit) 93 %    Rating of Perceived Exertion (Exercise) 11    Perceived Dyspnea (Exercise) 0    Symptoms none    Duration Continue with 30 min of aerobic exercise without signs/symptoms of physical distress.    Intensity THRR unchanged      Progression   Progression Continue to progress workloads to maintain intensity without signs/symptoms of physical distress.    Average METs 1.75      Resistance Training   Training Prescription Yes    Weight 3 lb    Reps 10-15      Interval Training   Interval Training No      T5 Nustep   Level 1    Minutes 15    METs 1.8      Biostep-RELP   Level 1    Minutes 15    METs 1      Oxygen   Maintain Oxygen Saturation 88% or higher             Functional Capacity:  6 Minute Walk     Row Name 01/10/21 1142         6 Minute Walk   Phase Initial     Distance 165 feet     Walk Time 0 minutes     # of Rest Breaks 0     MPH 0.31     METS 0.17     RPE 13     Perceived Dyspnea  1     VO2 Peak 0.62     Symptoms Yes (comment)     Comments Legs fatigued     Resting HR 72 bpm     Resting BP 124/78     Resting Oxygen Saturation  95 %     Exercise Oxygen Saturation  during 6 min walk 85 %     Max Ex. HR 106 bpm     Max Ex. BP 152/76     2 Minute Post BP 130/76       Interval HR   1 Minute HR 93     2 Minute HR 97     3 Minute HR 98     4 Minute HR 106     5  Minute HR 105     6 Minute HR 100     2 Minute Post HR 75     Interval Heart Rate? Yes       Interval Oxygen   Interval Oxygen? Yes     Baseline Oxygen Saturation % 95 %     1 Minute Oxygen Saturation % 90 %     1 Minute Liters of Oxygen 0 L  RA     2 Minute Oxygen Saturation % 87 %     2 Minute Liters of Oxygen 0 L     3 Minute Oxygen Saturation % 88 %     3 Minute Liters of Oxygen 0 L     4 Minute Oxygen Saturation % 89 %     4 Minute Liters of Oxygen 0 L     5 Minute Oxygen Saturation % 89 %     5 Minute Liters of Oxygen 0 L     6 Minute Oxygen Saturation % 85 %     6 Minute Liters of Oxygen 0 L     2 Minute Post Oxygen Saturation % 93 %     2 Minute Post Liters of Oxygen 0 L  Psychological, QOL, Others - Outcomes: PHQ 2/9: Depression screen PHQ 2/9 01/10/2021  Decreased Interest 0  Down, Depressed, Hopeless 0  PHQ - 2 Score 0  Altered sleeping 1  Tired, decreased energy 1  Change in appetite 0  Feeling bad or failure about yourself  1  Trouble concentrating 0  Moving slowly or fidgety/restless 0  Suicidal thoughts 0  PHQ-9 Score 3  Some recent data might be hidden    Quality of Life:   Personal Goals: Goals established at orientation with interventions provided to work toward goal.  Personal Goals and Risk Factors at Admission - 01/10/21 1516       Core Components/Risk Factors/Patient Goals on Admission    Weight Management Yes;Weight Loss    Intervention Weight Management: Develop a combined nutrition and exercise program designed to reach desired caloric intake, while maintaining appropriate intake of nutrient and fiber, sodium and fats, and appropriate energy expenditure required for the weight goal.;Weight Management: Provide education and appropriate resources to help participant work on and attain dietary goals.;Weight Management/Obesity: Establish reasonable short term and long term weight goals.    Admit Weight 232 lb (105.2 kg)     Goal Weight: Short Term 227 lb (103 kg)    Goal Weight: Long Term 200 lb (90.7 kg)    Expected Outcomes Short Term: Continue to assess and modify interventions until short term weight is achieved;Long Term: Adherence to nutrition and physical activity/exercise program aimed toward attainment of established weight goal;Weight Loss: Understanding of general recommendations for a balanced deficit meal plan, which promotes 1-2 lb weight loss per week and includes a negative energy balance of [phone] kcal/d;Understanding recommendations for meals to include 15-35% energy as protein, 25-35% energy from fat, 35-60% energy from carbohydrates, less than 215m of dietary cholesterol, 20-35 gm of total fiber daily;Understanding of distribution of calorie intake throughout the day with the consumption of 4-5 meals/snacks    Improve shortness of breath with ADL's Yes    Intervention Provide education, individualized exercise plan and daily activity instruction to help decrease symptoms of SOB with activities of daily living.    Expected Outcomes Short Term: Improve cardiorespiratory fitness to achieve a reduction of symptoms when performing ADLs;Long Term: Be able to perform more ADLs without symptoms or delay the onset of symptoms    Heart Failure Yes    Intervention Provide a combined exercise and nutrition program that is supplemented with education, support and counseling about heart failure. Directed toward relieving symptoms such as shortness of breath, decreased exercise tolerance, and extremity edema.    Expected Outcomes Improve functional capacity of life;Short term: Attendance in program 2-3 days a week with increased exercise capacity. Reported lower sodium intake. Reported increased fruit and vegetable intake. Reports medication compliance.;Short term: Daily weights obtained and reported for increase. Utilizing diuretic protocols set by physician.;Long term: Adoption of self-care skills and reduction of  barriers for early signs and symptoms recognition and intervention leading to self-care maintenance.    Hypertension Yes    Intervention Provide education on lifestyle modifcations including regular physical activity/exercise, weight management, moderate sodium restriction and increased consumption of fresh fruit, vegetables, and low fat dairy, alcohol moderation, and smoking cessation.;Monitor prescription use compliance.    Expected Outcomes Short Term: Continued assessment and intervention until BP is < 140/973mHG in hypertensive participants. < 130/8055mG in hypertensive participants with diabetes, heart failure or chronic kidney disease.;Long Term: Maintenance of blood pressure at goal levels.  Personal Goals Discharge:  Goals and Risk Factor Review     Row Name 02/01/21 1345 02/20/21 1344           Core Components/Risk Factors/Patient Goals Review   Personal Goals Review Hypertension;Weight Management/Obesity;Heart Failure Hypertension;Weight Management/Obesity;Heart Failure      Review Hannah Powell is focusing more on portion control, drinking more water, and reducing her snacks to help her lose weight which is still a goal of hers. Hannah Powell is weighing herself at home daily and is aware to note if she gains a lot of weight in a short period of time. Denies any heart failure symptoms at this time. She checks her BP at home and is running 620-355H systolic and 74-16L diastolic. Her BPs at rehab are good as well. Hannah Powell has not met with RD yet, new appointment scheduled for 12/12. Hannah Powell is still weighing herself daily reporting no major changes - reminded to take note of major weight changes in short period of time. She continues to check her BP at home when she remembers, every couple of weeks. Her BP today was 138/78.      Expected Outcomes Short: Continue working towards weight loss Long: Manage life style risk factors ST: check BP regularly at home (when not at rehab) LT: manage  lifestyle risk factors               Exercise Goals and Review:  Exercise Goals     Row Name 01/10/21 1515             Exercise Goals   Increase Physical Activity Yes       Intervention Provide advice, education, support and counseling about physical activity/exercise needs.;Develop an individualized exercise prescription for aerobic and resistive training based on initial evaluation findings, risk stratification, comorbidities and participant's personal goals.       Expected Outcomes Short Term: Attend rehab on a regular basis to increase amount of physical activity.;Long Term: Add in home exercise to make exercise part of routine and to increase amount of physical activity.;Long Term: Exercising regularly at least 3-5 days a week.       Increase Strength and Stamina Yes       Intervention Provide advice, education, support and counseling about physical activity/exercise needs.;Develop an individualized exercise prescription for aerobic and resistive training based on initial evaluation findings, risk stratification, comorbidities and participant's personal goals.       Expected Outcomes Short Term: Increase workloads from initial exercise prescription for resistance, speed, and METs.;Short Term: Perform resistance training exercises routinely during rehab and add in resistance training at home;Long Term: Improve cardiorespiratory fitness, muscular endurance and strength as measured by increased METs and functional capacity (6MWT)       Able to understand and use rate of perceived exertion (RPE) scale Yes       Intervention Provide education and explanation on how to use RPE scale       Expected Outcomes Short Term: Able to use RPE daily in rehab to express subjective intensity level;Long Term:  Able to use RPE to guide intensity level when exercising independently       Able to understand and use Dyspnea scale Yes       Intervention Provide education and explanation on how to use  Dyspnea scale       Expected Outcomes Short Term: Able to use Dyspnea scale daily in rehab to express subjective sense of shortness of breath during exertion;Long Term: Able to use Dyspnea scale to guide intensity  level when exercising independently       Knowledge and understanding of Target Heart Rate Range (THRR) Yes       Intervention Provide education and explanation of THRR including how the numbers were predicted and where they are located for reference       Expected Outcomes Short Term: Able to state/look up THRR;Long Term: Able to use THRR to govern intensity when exercising independently;Short Term: Able to use daily as guideline for intensity in rehab       Able to check pulse independently Yes       Intervention Provide education and demonstration on how to check pulse in carotid and radial arteries.;Review the importance of being able to check your own pulse for safety during independent exercise       Expected Outcomes Short Term: Able to explain why pulse checking is important during independent exercise;Long Term: Able to check pulse independently and accurately       Understanding of Exercise Prescription Yes       Intervention Provide education, explanation, and written materials on patient's individual exercise prescription       Expected Outcomes Short Term: Able to explain program exercise prescription;Long Term: Able to explain home exercise prescription to exercise independently                Exercise Goals Re-Evaluation:  Exercise Goals Re-Evaluation     Row Name 01/23/21 1354 01/31/21 1526 02/01/21 1339 02/14/21 1406 02/20/21 1347     Exercise Goal Re-Evaluation   Exercise Goals Review Able to understand and use rate of perceived exertion (RPE) scale;Increase Physical Activity;Knowledge and understanding of Target Heart Rate Range (THRR);Understanding of Exercise Prescription;Increase Strength and Stamina;Able to understand and use Dyspnea scale;Able to check pulse  independently Increase Physical Activity;Increase Strength and Stamina Increase Physical Activity;Increase Strength and Stamina Increase Physical Activity;Increase Strength and Stamina;Understanding of Exercise Prescription Increase Physical Activity;Increase Strength and Stamina;Understanding of Exercise Prescription   Comments Reviewed RPE and dyspnea scales, THR and program prescription with pt today.  Pt voiced understanding and was given a copy of goals to take home. Hannah Powell is just getting started with her exercise program.  She almost reaches her THR range.  Staff will monitor progress. EP will hold off on doing home exercise with patient until patient is further into the program. She is a member at [employer] and she plans to go there once she is more in habit of rehab with exercise. I encouraged her to keep moving and start home exercise as soon as she feels ready. We talked about starting with 1 day at home. Hannah Powell is doing well in rehab.  She is getting in her full 30 min on the NuStep at level 2!  We will conitnue to montior her progress. --   Expected Outcomes Short: Use RPE daily to regulate intensity. Long: Follow program prescription in THR. Short: attend consistently Long:  improve overall stamina Short: Go over home exercise Long: Exercise independently at home at appropriate prescription short: Improve walking more Long; Conitnue to improve stamina --    Row Name 02/20/21 1348 02/27/21 1039           Exercise Goal Re-Evaluation   Exercise Goals Review Increase Physical Activity;Increase Strength and Stamina;Understanding of Exercise Prescription Increase Physical Activity;Increase Strength and Stamina;Understanding of Exercise Prescription      Comments Hannah Powell did not get home exercise done yet due to exercise level. Per last EP note: "She is a member at [employer] and she plans to go there once she is more in habit of rehab with exercise. I encouraged her to keep moving and  start home exercise as soon as she feels ready. We talked about starting with 1 day at home." She has been more active at home and deep cleaned her house recently and she felt like she was very worn out afterwards, but reports not knowing how to pace herself. Encourged her to keep active, but not to push herself. We talked to Hannah Powell about doing PT prior to finishing rehab so that she would be able to walk more in class and move around easier.  She seemed okay with it on Wednesday when we talked with her, but she called out on Thursday saying that it really upset her.  We discussed situation with her physcian and Dr. Lanney Gins agreed that PT may be a better fit to start.  We will again discuss with Hannah Powell today when she comes.  She is still on level 1 on all equipment and stays put for her 30 min of exercise.  She has a difficult time moving to from chair to scale and chair to equipment.      Expected Outcomes short: continue to stay active at least 1 day per week outside of rehab, EP to go over home exercise when pt is ready Long; Conitnue to improve stamina Short: Follow up with patient on PT referral Long: Build up strength for rehab.               Nutrition & Weight - Outcomes:    Nutrition:  Nutrition Therapy & Goals - 01/10/21 1516       Intervention Plan   Intervention Prescribe, educate and counsel regarding individualized specific dietary modifications aiming towards targeted core components such as weight, hypertension, lipid management, diabetes, heart failure and other comorbidities.    Expected Outcomes Short Term Goal: Understand basic principles of dietary content, such as calories, fat, sodium, cholesterol and nutrients.;Short Term Goal: A plan has been developed with personal nutrition goals set during dietitian appointment.;Long Term Goal: Adherence to prescribed nutrition plan.             Nutrition Discharge:   Education Questionnaire Score:  Knowledge Questionnaire  Score - 01/10/21 1122       Knowledge Questionnaire Score   Pre Score 16/18: Exercise, O2             Goals reviewed with patient; copy given to patient.

## 2021-05-15 NOTE — Progress Notes (Signed)
Pulmonary Individual Treatment Plan  Patient Details  Name: Hannah Powell MRN: [id number] Date of Birth: 1945-04-15 Referring Provider:   Flowsheet Row Pulmonary Rehab from 01/10/2021 in [facility] Cardiac and Pulmonary Rehab  Referring Provider Ottie Glazier MD       Initial Encounter Date:  Flowsheet Row Pulmonary Rehab from 01/10/2021 in [facility] Cardiac and Pulmonary Rehab  Date 01/10/21       Visit Diagnosis: Chronic obstructive pulmonary disease, unspecified COPD type ([place])  Patient's Home Medications on Admission:  Current Outpatient Medications:    albuterol (PROVENTIL) (2.5 MG/3ML) 0.083% nebulizer solution, Take 2.5 mg by nebulization every 6 (six) hours as needed for wheezing or shortness of breath., Disp: , Rfl:    ALPRAZolam (XANAX) 0.25 MG tablet, Take 1 tablet (0.25 mg total) by mouth 3 (three) times daily as needed for anxiety., Disp: 30 tablet, Rfl: 0   ALPRAZolam (XANAX) 0.25 MG tablet, Take 1 tablet by mouth 3 (three) times daily as needed. (Patient not taking: Reported on 12/05/2020), Disp: , Rfl:    amoxicillin (AMOXIL) 500 MG capsule, amoxicillin 500 mg capsule (Patient not taking: Reported on 12/05/2020), Disp: , Rfl:    azelastine (ASTELIN) 0.1 % nasal spray, , Disp: , Rfl:    azithromycin (ZITHROMAX) 250 MG tablet, TAKE 1 TABLET (250 MG TOTAL) BY MOUTH DAILY., Disp: 90 tablet, Rfl: 1   B Complex-C (B-COMPLEX WITH VITAMIN C) tablet, Take 1 tablet by mouth daily. , Disp: , Rfl:    calcium carbonate (OSCAL) 1500 (600 Ca) MG TABS tablet, Take by mouth., Disp: , Rfl:    Calcium Carbonate-Vitamin D (CALCIUM-VITAMIN D) 500-200 MG-UNIT per tablet, Take 1 tablet by mouth daily., Disp: , Rfl:    cyclobenzaprine (FLEXERIL) 10 MG tablet, Take 10 mg by mouth 3 (three) times daily as needed for muscle spasms.  (Patient not taking: Reported on 12/05/2020), Disp: , Rfl:    ferrous sulfate 325 (65 FE) MG tablet, Take 325 mg by mouth daily with breakfast., Disp: , Rfl:     fluticasone furoate-vilanterol (BREO ELLIPTA) 200-25 MCG/INH AEPB, Inhale 1 puff into the lungs daily. (Patient not taking: Reported on 12/05/2020), Disp: 1 each, Rfl: 0   fluticasone furoate-vilanterol (BREO ELLIPTA) 200-25 MCG/INH AEPB, Inhale 1 puff into the lungs daily., Disp: 60 each, Rfl: 3   fluticasone furoate-vilanterol (BREO ELLIPTA) 200-25 MCG/INH AEPB, Inhale into the lungs. (Patient not taking: Reported on 12/05/2020), Disp: , Rfl:    furosemide (LASIX) 20 MG tablet, Take 2 tablets (40 mg total) by mouth 2 (two) times daily. (Patient not taking: Reported on 12/05/2020), Disp: , Rfl:    furosemide (LASIX) 80 MG tablet, Take by mouth., Disp: , Rfl:    gabapentin (NEURONTIN) 300 MG capsule, Take 300 mg by mouth at bedtime.  (Patient not taking: Reported on 12/05/2020), Disp: , Rfl:    ibuprofen (ADVIL) 800 MG tablet, Take by mouth. (Patient not taking: Reported on 12/05/2020), Disp: , Rfl:    ibuprofen (ADVIL,MOTRIN) 200 MG tablet, Take 800 mg by mouth every 6 (six) hours as needed for headache or mild pain. , Disp: , Rfl:    ipratropium-albuterol (DUONEB) 0.5-2.5 (3) MG/3ML SOLN, Take 3 mLs by nebulization every 4 (four) hours as needed., Disp: , Rfl:    ketoconazole (NIZORAL) 2 % cream, Apply once daily under the breast and left under arm., Disp: 60 g, Rfl: 1   levalbuterol (XOPENEX) 0.31 MG/3ML nebulizer solution, Inhale into the lungs., Disp: , Rfl:    levothyroxine (SYNTHROID) 112 MCG  tablet, Take 112 mcg by mouth daily before breakfast. Take 2 tabs po QD (Patient not taking: Reported on 12/05/2020), Disp: , Rfl:    levothyroxine (SYNTHROID) 112 MCG tablet, Take by mouth., Disp: , Rfl:    levothyroxine (SYNTHROID, LEVOTHROID) 200 MCG tablet, Take 200 mcg by mouth daily before breakfast., Disp: , Rfl:    loratadine-pseudoephedrine (CLARITIN-D 24-HOUR) 10-240 MG 24 hr tablet, Take 1 tablet by mouth daily as needed for allergies., Disp: , Rfl:    losartan (COZAAR) 25 MG tablet, Take 25 mg by  mouth daily., Disp: , Rfl:    losartan (COZAAR) 25 MG tablet, Take 1 tablet by mouth daily. (Patient not taking: Reported on 12/05/2020), Disp: , Rfl:    Multiple Vitamins-Minerals (MULTIVITAMIN WOMEN PO), Take 1 tablet by mouth daily., Disp: , Rfl:    Omega-3 Fatty Acids (OMEGA 3 PO), Take 1 capsule by mouth daily., Disp: , Rfl:    oxybutynin (DITROPAN) 5 MG tablet, Take 5 mg by mouth 3 (three) times daily. (Patient not taking: Reported on 12/05/2020), Disp: , Rfl:    oxybutynin (DITROPAN) 5 MG tablet, Take 1 tablet by mouth 3 (three) times daily., Disp: , Rfl:    potassium chloride SA (K-DUR,KLOR-CON) 20 MEQ tablet, Take 20 mEq by mouth 2 (two) times daily.  (Patient not taking: Reported on 12/05/2020), Disp: , Rfl:    potassium chloride SA (KLOR-CON) 20 MEQ tablet, Take 1 tablet by mouth 2 (two) times daily., Disp: , Rfl:    pramipexole (MIRAPEX) 1 MG tablet, Take 4 mg by mouth at bedtime. , Disp: , Rfl:    spironolactone (ALDACTONE) 25 MG tablet, Take 25 mg by mouth 2 (two) times daily. , Disp: , Rfl:    spironolactone (ALDACTONE) 25 MG tablet, Take 1 tablet by mouth 2 (two) times daily. (Patient not taking: Reported on 12/05/2020), Disp: , Rfl:    topiramate (TOPAMAX) 100 MG tablet, Take 100 mg by mouth 2 (two) times daily., Disp: , Rfl:    topiramate (TOPAMAX) 100 MG tablet, Take by mouth. (Patient not taking: Reported on 12/05/2020), Disp: , Rfl:    traMADol (ULTRAM) 50 MG tablet, Take 1 tablet (50 mg total) by mouth every 8 (eight) hours as needed (for mild pain)., Disp: 30 tablet, Rfl: 0   traMADol (ULTRAM) 50 MG tablet, Take 1 tablet by mouth every 8 (eight) hours as needed. (Patient not taking: Reported on 12/05/2020), Disp: , Rfl:    zolpidem (AMBIEN) 10 MG tablet, Take 5 mg by mouth at bedtime as needed for sleep., Disp: , Rfl:   Past Medical History: Past Medical History:  Diagnosis Date   Anxiety    Arthritis    CHF (congestive heart failure) (HCC)    COPD (chronic obstructive  pulmonary disease) ([place])    Dysplastic nevus 04/22/2019   R abdomen parallel to sup umbilicus - mod   Dysplastic nevus 04/22/2019   R low back above waistline - mild    Edema    feet/legs   Hypertension    dr Otho Najjar      Hypothyroidism    Leg weakness, bilateral    Osteoporosis    Oxygen deficit    2l with bipap at night   RLS (restless legs syndrome)    Shortness of breath    Sleep apnea    bipap   Wheezing     Tobacco Use: Social History   Tobacco Use  Smoking Status Former   Packs/day: 1.00   Years: 10.00  Pack years: 10.00   Types: Cigarettes   Quit date: 03/20/1975   Years since quitting: 46.1  Smokeless Tobacco Never    Labs: Recent Review Flowsheet Data     Labs for ITP Cardiac and Pulmonary Rehab Latest Ref Rng & Units 07/15/2016 07/15/2016 07/17/2016 08/01/2016 11/17/2020   Trlycerides <150 mg/dL - - 142 - -   Hemoglobin A1c 4.0 - 6.0 % - - - - -   PHART 7.350 - 7.450 7.44 7.52(H) - - 7.39   PCO2ART 32.0 - 48.0 mmHg 91(HH) 71(HH) - - 55(H)   HCO3 20.0 - 28.0 mmol/L 61.8(H) 58.0(H) - 54.6(H) 33.3(H)   ACIDBASEDEF 0.0 - 2.0 mmol/L - - - - -   O2SAT % 97.2 94.9 - 67.1 90.7        Pulmonary Assessment Scores:  Pulmonary Assessment Scores     Row Name 01/10/21 1126         ADL UCSD   ADL Phase Entry     SOB Score total 11     Rest 0     Walk 1     Stairs 0  didn't complete as she stated she does not do them     Bath 0     Dress 1     Shop 1       CAT Score   CAT Score 9       mMRC Score   mMRC Score 0              UCSD: Self-administered rating of dyspnea associated with activities of daily living (ADLs) 6-point scale (0 = "not at all" to 5 = "maximal or unable to do because of breathlessness")  Scoring Scores range from 0 to 120.  Minimally important difference is 5 units  CAT: CAT can identify the health impairment of COPD patients and is better correlated with disease progression.  CAT has a scoring range of zero to 40. The CAT  score is classified into four groups of low (less than 10), medium (10 - 20), high (21-30) and very high (31-40) based on the impact level of disease on health status. A CAT score over 10 suggests significant symptoms.  A worsening CAT score could be explained by an exacerbation, poor medication adherence, poor inhaler technique, or progression of COPD or comorbid conditions.  CAT MCID is 2 points  mMRC: mMRC (Modified Medical Research Council) Dyspnea Scale is used to assess the degree of baseline functional disability in patients of respiratory disease due to dyspnea. No minimal important difference is established. A decrease in score of 1 point or greater is considered a positive change.   Pulmonary Function Assessment:  Pulmonary Function Assessment - 12/05/20 1118       Breath   Shortness of Breath Yes;Limiting activity             Exercise Target Goals: Exercise Program Goal: Individual exercise prescription set using results from initial 6 min walk test and THRR while considering  patients activity barriers and safety.   Exercise Prescription Goal: Initial exercise prescription builds to 30-45 minutes a day of aerobic activity, 2-3 days per week.  Home exercise guidelines will be given to patient during program as part of exercise prescription that the participant will acknowledge.  Education: Aerobic Exercise: - Group verbal and visual presentation on the components of exercise prescription. Introduces F.I.T.T principle from ACSM for exercise prescriptions.  Reviews F.I.T.T. principles of aerobic exercise including progression. Written material given at graduation. Flowsheet  Row Pulmonary Rehab from 02/22/2021 in [facility] Cardiac and Pulmonary Rehab  Education need identified 01/10/21       Education: Resistance Exercise: - Group verbal and visual presentation on the components of exercise prescription. Introduces F.I.T.T principle from ACSM for exercise prescriptions  Reviews  F.I.T.T. principles of resistance exercise including progression. Written material given at graduation.    Education: Exercise & Equipment Safety: - Individual verbal instruction and demonstration of equipment use and safety with use of the equipment. Flowsheet Row Pulmonary Rehab from 12/05/2020 in [facility] Card[facility] Pulmonary Rehab  Date 12/05/20  Educator [facility]  Instruction Review Code 1- Verbalizes Understanding       Education: Exercise Physiology & General Exercise Guidelines: - Group verbal and written instruction with models to review the exercise physiology of the cardiovascular system and associated critical values. Provides general exercise guidelines with specific guidelines to those with heart or lung disease.    Education: Flexibility, Balance, Mind/Body Relaxation: - Group verbal and visual presentation with interactive activity on the components of exercise prescription. Introduces F.I.T.T principle from ACSM for exercise prescriptions. Reviews F.I.T.T. principles of flexibility and balance exercise training including progression. Also discusses the mind body connection.  Reviews various relaxation techniques to help reduce and manage stress (i.e. Deep breathing, progressive muscle relaxation, and visualization). Balance handout provided to take home. Written material given at graduation. Flowsheet Row Pulmonary Rehab from 02/22/2021 in [facility] Cardiac and Pulmonary Rehab  Date 01/25/21  Educator AS  Instruction Review Code 1- Verbalizes Understanding       Activity Barriers & Risk Stratification:   6 Minute Walk:  6 Minute Walk     Row Name 01/10/21 1142         6 Minute Walk   Phase Initial     Distance 165 feet     Walk Time 0 minutes     # of Rest Breaks 0     MPH 0.31     METS 0.17     RPE 13     Perceived Dyspnea  1     VO2 Peak 0.62     Symptoms Yes (comment)     Comments Legs fatigued     Resting HR 72 bpm     Resting BP 124/78     Resting Oxygen  Saturation  95 %     Exercise Oxygen Saturation  during 6 min walk 85 %     Max Ex. HR 106 bpm     Max Ex. BP 152/76     2 Minute Post BP 130/76       Interval HR   1 Minute HR 93     2 Minute HR 97     3 Minute HR 98     4 Minute HR 106     5 Minute HR 105     6 Minute HR 100     2 Minute Post HR 75     Interval Heart Rate? Yes       Interval Oxygen   Interval Oxygen? Yes     Baseline Oxygen Saturation % 95 %     1 Minute Oxygen Saturation % 90 %     1 Minute Liters of Oxygen 0 L  RA     2 Minute Oxygen Saturation % 87 %     2 Minute Liters of Oxygen 0 L     3 Minute Oxygen Saturation % 88 %     3 Minute Liters of Oxygen  0 L     4 Minute Oxygen Saturation % 89 %     4 Minute Liters of Oxygen 0 L     5 Minute Oxygen Saturation % 89 %     5 Minute Liters of Oxygen 0 L     6 Minute Oxygen Saturation % 85 %     6 Minute Liters of Oxygen 0 L     2 Minute Post Oxygen Saturation % 93 %     2 Minute Post Liters of Oxygen 0 L             Oxygen Initial Assessment:  Oxygen Initial Assessment - 02/01/21 1341       Home Oxygen   Home Oxygen Device Home Concentrator;Portable Concentrator    Sleep Oxygen Prescription BiPAP    Liters per minute 2    Home Exercise Oxygen Prescription None    Home Resting Oxygen Prescription None    Compliance with Home Oxygen Use Yes      Initial 6 min Walk   Oxygen Used None      Program Oxygen Prescription   Program Oxygen Prescription None      Intervention   Short Term Goals To learn and exhibit compliance with exercise, home and travel O2 prescription;To learn and understand importance of monitoring SPO2 with pulse oximeter and demonstrate accurate use of the pulse oximeter.;To learn and understand importance of maintaining oxygen saturations>88%;To learn and demonstrate proper pursed lip breathing techniques or other breathing techniques.     Long  Term Goals Exhibits compliance with exercise, home  and travel O2  prescription;Verbalizes importance of monitoring SPO2 with pulse oximeter and return demonstration;Maintenance of O2 saturations>88%;Exhibits proper breathing techniques, such as pursed lip breathing or other method taught during program session             Oxygen Re-Evaluation:  Oxygen Re-Evaluation     Row Name 01/23/21 1355 02/01/21 1341 02/20/21 1355         Program Oxygen Prescription   Program Oxygen Prescription None -- None       Home Oxygen   Home Oxygen Device Home Concentrator;Portable Concentrator -- Home Concentrator;Portable Concentrator     Sleep Oxygen Prescription BiPAP -- BiPAP     Liters per minute 2 -- 2     Home Exercise Oxygen Prescription None -- None     Home Resting Oxygen Prescription None -- None     Compliance with Home Oxygen Use Yes -- Yes       Goals/Expected Outcomes   Short Term Goals To learn and exhibit compliance with exercise, home and travel O2 prescription;To learn and understand importance of monitoring SPO2 with pulse oximeter and demonstrate accurate use of the pulse oximeter.;To learn and understand importance of maintaining oxygen saturations>88%;To learn and demonstrate proper pursed lip breathing techniques or other breathing techniques.  -- To learn and exhibit compliance with exercise, home and travel O2 prescription;To learn and understand importance of monitoring SPO2 with pulse oximeter and demonstrate accurate use of the pulse oximeter.;To learn and understand importance of maintaining oxygen saturations>88%;To learn and demonstrate proper pursed lip breathing techniques or other breathing techniques.      Long  Term Goals Exhibits compliance with exercise, home  and travel O2 prescription;Verbalizes importance of monitoring SPO2 with pulse oximeter and return demonstration;Maintenance of O2 saturations>88%;Exhibits proper breathing techniques, such as pursed lip breathing or other method taught during program session -- Exhibits  compliance with exercise, home  and travel O2  prescription;Verbalizes importance of monitoring SPO2 with pulse oximeter and return demonstration;Maintenance of O2 saturations>88%;Exhibits proper breathing techniques, such as pursed lip breathing or other method taught during program session     Comments Reviewed PLB technique with pt.  Talked about how it works and it's importance in maintaining their exercise saturations. Hannah Powell uses her pulse ox at home and is aware to have her oxygen stay above 88%. She knows how to practice PLB when needed and when she feels SOB. Hannah Powell uses her pulse ox at home and is aware to have her oxygen stay above 88%. She knows how to practice PLB when needed and when she feels shortness of breath, she feels this helps.     Goals/Expected Outcomes Short: Become more profiecient at using PLB.   Long: Become independent at using PLB. Short: Continue monitoring O2 at home Long: Become independent using PLB Short: Continue monitoring O2 at home Long: Become independent using PLB              Oxygen Discharge (Final Oxygen Re-Evaluation):  Oxygen Re-Evaluation - 02/20/21 1355       Program Oxygen Prescription   Program Oxygen Prescription None      Home Oxygen   Home Oxygen Device Home Concentrator;Portable Concentrator    Sleep Oxygen Prescription BiPAP    Liters per minute 2    Home Exercise Oxygen Prescription None    Home Resting Oxygen Prescription None    Compliance with Home Oxygen Use Yes      Goals/Expected Outcomes   Short Term Goals To learn and exhibit compliance with exercise, home and travel O2 prescription;To learn and understand importance of monitoring SPO2 with pulse oximeter and demonstrate accurate use of the pulse oximeter.;To learn and understand importance of maintaining oxygen saturations>88%;To learn and demonstrate proper pursed lip breathing techniques or other breathing techniques.     Long  Term Goals Exhibits compliance with exercise, home   and travel O2 prescription;Verbalizes importance of monitoring SPO2 with pulse oximeter and return demonstration;Maintenance of O2 saturations>88%;Exhibits proper breathing techniques, such as pursed lip breathing or other method taught during program session    Comments Hannah Powell uses her pulse ox at home and is aware to have her oxygen stay above 88%. She knows how to practice PLB when needed and when she feels shortness of breath, she feels this helps.    Goals/Expected Outcomes Short: Continue monitoring O2 at home Long: Become independent using PLB             Initial Exercise Prescription:  Initial Exercise Prescription - 01/10/21 1500       Date of Initial Exercise RX and Referring Provider   Date 01/10/21    Referring Provider Ottie Glazier MD      Oxygen   Maintain Oxygen Saturation 88% or higher      Recumbant Bike   Level 1    RPM 60    Minutes 15    METs 1      NuStep   Level 1    SPM 80    Minutes 15    METs 1      REL-XR   Level 1    Speed 50    Minutes 15    METs 1      Track   Laps 2   as tolerated   Minutes 15    METs 1      Prescription Details   Frequency (times per week) 2    Duration Progress  to 30 minutes of continuous aerobic without signs/symptoms of physical distress      Intensity   THRR 40-80% of Max Heartrate 101-131    Ratings of Perceived Exertion 11-13    Perceived Dyspnea 0-4      Progression   Progression Continue to progress workloads to maintain intensity without signs/symptoms of physical distress.      Resistance Training   Training Prescription Yes    Weight 3 lb    Reps 10-15             Perform Capillary Blood Glucose checks as needed.  Exercise Prescription Changes:   Exercise Prescription Changes     Row Name 01/10/21 1500 01/31/21 1500 02/14/21 1400 02/27/21 1000       Response to Exercise   Blood Pressure (Admit) 124/78 130/72 138/76 122/76    Blood Pressure (Exercise) 152/76 -- 144/76 134/66     Blood Pressure (Exit) 132/78 134/74 126/72 120/78    Heart Rate (Admit) 72 bpm 95 bpm 103 bpm 97 bpm    Heart Rate (Exercise) 106 bpm 99 bpm 106 bpm 103 bpm    Heart Rate (Exit) 75 bpm 92 bpm 93 bpm 100 bpm    Oxygen Saturation (Admit) 95 % 90 % 91 % 93 %    Oxygen Saturation (Exercise) 85 % 92 % 85 % 90 %    Oxygen Saturation (Exit) 94 % 93 % 94 % 93 %    Rating of Perceived Exertion (Exercise) _0 Perceived Dyspnea (Exercise) 1 1 0 0    Symptoms Legs fatigued none -- none    Comments walk test results -- -- --    Duration -- Progress to 30 minutes of  aerobic without signs/symptoms of physical distress Continue with 30 min of aerobic exercise without signs/symptoms of physical distress. Continue with 30 min of aerobic exercise without signs/symptoms of physical distress.    Intensity -- THRR unchanged THRR unchanged THRR unchanged      Progression   Progression -- Continue to progress workloads to maintain intensity without signs/symptoms of physical distress. Continue to progress workloads to maintain intensity without signs/symptoms of physical distress. Continue to progress workloads to maintain intensity without signs/symptoms of physical distress.    Average METs -- 1.25 1.35 1.75      Resistance Training   Training Prescription -- Yes Yes Yes    Weight -- 3 lb 3 lb 3 lb    Reps -- 10-15 10-15 10-15      Interval Training   Interval Training -- No No No      NuStep   Level -- 1 2 --    Minutes -- 15 30 --    METs -- 1.5 1.4 --      T5 Nustep   Level -- -- -- 1    Minutes -- -- -- 15    METs -- -- -- 1.8      Biostep-RELP   Level -- 1 -- 1    Minutes -- 15 -- 15    METs -- 1 -- 1      Oxygen   Maintain Oxygen Saturation -- -- 88% or higher 88% or higher             Exercise Comments:   Exercise Comments     Row Name 01/23/21 1354           Exercise Comments First full day of exercise!  Patient was oriented to  gym and equipment including  functions, settings, policies, and procedures.  Patient's individual exercise prescription and treatment plan were reviewed.  All starting workloads were established based on the results of the 6 minute walk test done at initial orientation visit.  The plan for exercise progression was also introduced and progression will be customized based on patient's performance and goals.                Exercise Goals and Review:   Exercise Goals     Row Name 01/10/21 1515             Exercise Goals   Increase Physical Activity Yes       Intervention Provide advice, education, support and counseling about physical activity/exercise needs.;Develop an individualized exercise prescription for aerobic and resistive training based on initial evaluation findings, risk stratification, comorbidities and participant's personal goals.       Expected Outcomes Short Term: Attend rehab on a regular basis to increase amount of physical activity.;Long Term: Add in home exercise to make exercise part of routine and to increase amount of physical activity.;Long Term: Exercising regularly at least 3-5 days a week.       Increase Strength and Stamina Yes       Intervention Provide advice, education, support and counseling about physical activity/exercise needs.;Develop an individualized exercise prescription for aerobic and resistive training based on initial evaluation findings, risk stratification, comorbidities and participant's personal goals.       Expected Outcomes Short Term: Increase workloads from initial exercise prescription for resistance, speed, and METs.;Short Term: Perform resistance training exercises routinely during rehab and add in resistance training at home;Long Term: Improve cardiorespiratory fitness, muscular endurance and strength as measured by increased METs and functional capacity (6MWT)       Able to understand and use rate of perceived exertion (RPE) scale Yes       Intervention Provide  education and explanation on how to use RPE scale       Expected Outcomes Short Term: Able to use RPE daily in rehab to express subjective intensity level;Long Term:  Able to use RPE to guide intensity level when exercising independently       Able to understand and use Dyspnea scale Yes       Intervention Provide education and explanation on how to use Dyspnea scale       Expected Outcomes Short Term: Able to use Dyspnea scale daily in rehab to express subjective sense of shortness of breath during exertion;Long Term: Able to use Dyspnea scale to guide intensity level when exercising independently       Knowledge and understanding of Target Heart Rate Range (THRR) Yes       Intervention Provide education and explanation of THRR including how the numbers were predicted and where they are located for reference       Expected Outcomes Short Term: Able to state/look up THRR;Long Term: Able to use THRR to govern intensity when exercising independently;Short Term: Able to use daily as guideline for intensity in rehab       Able to check pulse independently Yes       Intervention Provide education and demonstration on how to check pulse in carotid and radial arteries.;Review the importance of being able to check your own pulse for safety during independent exercise       Expected Outcomes Short Term: Able to explain why pulse checking is important during independent exercise;Long Term: Able to check pulse independently and accurately  Understanding of Exercise Prescription Yes       Intervention Provide education, explanation, and written materials on patient's individual exercise prescription       Expected Outcomes Short Term: Able to explain program exercise prescription;Long Term: Able to explain home exercise prescription to exercise independently                Exercise Goals Re-Evaluation :  Exercise Goals Re-Evaluation     Row Name 01/23/21 1354 01/31/21 1526 02/01/21 1339 02/14/21  1406 02/20/21 1347     Exercise Goal Re-Evaluation   Exercise Goals Review Able to understand and use rate of perceived exertion (RPE) scale;Increase Physical Activity;Knowledge and understanding of Target Heart Rate Range (THRR);Understanding of Exercise Prescription;Increase Strength and Stamina;Able to understand and use Dyspnea scale;Able to check pulse independently Increase Physical Activity;Increase Strength and Stamina Increase Physical Activity;Increase Strength and Stamina Increase Physical Activity;Increase Strength and Stamina;Understanding of Exercise Prescription Increase Physical Activity;Increase Strength and Stamina;Understanding of Exercise Prescription   Comments Reviewed RPE and dyspnea scales, THR and program prescription with pt today.  Pt voiced understanding and was given a copy of goals to take home. Hannah Powell is just getting started with her exercise program.  She almost reaches her THR range.  Staff will monitor progress. EP will hold off on doing home exercise with patient until patient is further into the program. She is a member at [facility] and she plans to go there once she is more in habit of rehab with exercise. I encouraged her to keep moving and start home exercise as soon as she feels ready. We talked about starting with 1 day at home. Hannah Powell is doing well in rehab.  She is getting in her full 30 min on the NuStep at level 2!  We will conitnue to montior her progress. --   Expected Outcomes Short: Use RPE daily to regulate intensity. Long: Follow program prescription in THR. Short: attend consistently Long:  improve overall stamina Short: Go over home exercise Long: Exercise independently at home at appropriate prescription short: Improve walking more Long; Conitnue to improve stamina --    Row Name 02/20/21 1348 02/27/21 1039           Exercise Goal Re-Evaluation   Exercise Goals Review Increase Physical Activity;Increase Strength and Stamina;Understanding of  Exercise Prescription Increase Physical Activity;Increase Strength and Stamina;Understanding of Exercise Prescription      Comments Hannah Powell did not get home exercise done yet due to exercise level. Per last EP note: "She is a member at [facility] and she plans to go there once she is more in habit of rehab with exercise. I encouraged her to keep moving and start home exercise as soon as she feels ready. We talked about starting with 1 day at home." She has been more active at home and deep cleaned her house recently and she felt like she was very worn out afterwards, but reports not knowing how to pace herself. Encourged her to keep active, but not to push herself. We talked to Hannah Powell about doing PT prior to finishing rehab so that she would be able to walk more in class and move around easier.  She seemed okay with it on Wednesday when we talked with her, but she called out on Thursday saying that it really upset her.  We discussed situation with her physcian and Dr. Lanney Gins agreed that PT may be a better fit to start.  We will again discuss with Hannah Powell today when  she comes.  She is still on level 1 on all equipment and stays put for her 30 min of exercise.  She has a difficult time moving to from chair to scale and chair to equipment.      Expected Outcomes short: continue to stay active at least 1 day per week outside of rehab, EP to go over home exercise when pt is ready Long; Conitnue to improve stamina Short: Follow up with patient on PT referral Long: Build up strength for rehab.               Discharge Exercise Prescription (Final Exercise Prescription Changes):  Exercise Prescription Changes - 02/27/21 1000       Response to Exercise   Blood Pressure (Admit) 122/76    Blood Pressure (Exercise) 134/66    Blood Pressure (Exit) 120/78    Heart Rate (Admit) 97 bpm    Heart Rate (Exercise) 103 bpm    Heart Rate (Exit) 100 bpm    Oxygen Saturation (Admit) 93 %    Oxygen Saturation  (Exercise) 90 %    Oxygen Saturation (Exit) 93 %    Rating of Perceived Exertion (Exercise) 11    Perceived Dyspnea (Exercise) 0    Symptoms none    Duration Continue with 30 min of aerobic exercise without signs/symptoms of physical distress.    Intensity THRR unchanged      Progression   Progression Continue to progress workloads to maintain intensity without signs/symptoms of physical distress.    Average METs 1.75      Resistance Training   Training Prescription Yes    Weight 3 lb    Reps 10-15      Interval Training   Interval Training No      T5 Nustep   Level 1    Minutes 15    METs 1.8      Biostep-RELP   Level 1    Minutes 15    METs 1      Oxygen   Maintain Oxygen Saturation 88% or higher             Nutrition:  Target Goals: Understanding of nutrition guidelines, daily intake of sodium <1594m, cholesterol <2024m calories 30% from fat and 7% or less from saturated fats, daily to have 5 or more servings of fruits and vegetables.  Education: All About Nutrition: -Group instruction provided by verbal, written material, interactive activities, discussions, models, and posters to present general guidelines for heart healthy nutrition including fat, fiber, MyPlate, the role of sodium in heart healthy nutrition, utilization of the nutrition label, and utilization of this knowledge for meal planning. Follow up email sent as well. Written material given at graduation. [facility]  Date 02/01/21  [facility] Code 1- Verbalizes Understanding       Biometrics:    Nutrition Therapy Plan and Nutrition Goals:  Nutrition Therapy & Goals - 01/10/21 1516       Intervention Plan   Intervention Prescribe, educate and counsel regarding individualized specific dietary modifications aiming towards targeted core components such as weight, hypertension, lipid management, diabetes,  heart failure and other comorbidities.    Expected Outcomes Short Term Goal: Understand basic principles of dietary content, such as calories, fat, sodium, cholesterol and nutrients.;Short Term Goal: A plan has been developed with personal nutrition goals set during dietitian appointment.;Long Term Goal: Adherence to prescribed nutrition plan.  Nutrition Assessments:  MEDIFICTS Score Key: ?70 Need to make dietary changes  40-70 Heart Healthy Diet ? 40 Therapeutic Level Cholesterol Diet  Flowsheet Row Pulmonary Rehab from 01/10/2021 in [facility] and Pulmonary Rehab  Picture Your Plate Total Score on Admission 76      Picture Your Plate Scores: <62 Unhealthy dietary pattern with much room for improvement. 41-50 Dietary pattern unlikely to meet recommendations for good health and room for improvement. 51-60 More healthful dietary pattern, with some room for improvement.  >60 Healthy dietary pattern, although there may be some specific behaviors that could be improved.   Nutrition Goals Re-Evaluation:  Nutrition Goals Re-Evaluation     [place] Name 02/01/21 1519 02/20/21 1343           Goals   Comment Hannah Powell has not met with the RD yet. She has an appointment coming up. Nutrition appointment rescheduled for 12/12.      Expected Outcome Short: Meet with RD Long: Follow goals established by RD Short: Meet with RD Long: Follow goals established by RD               Nutrition Goals Discharge (Final Nutrition Goals Re-Evaluation):  Nutrition Goals Re-Evaluation - 02/20/21 1343       Goals   Comment Nutrition appointment rescheduled for 12/12.    Expected Outcome Short: Meet with RD Long: Follow goals established by RD             Psychosocial: Target Goals: Acknowledge presence or absence of significant depression and/or stress, maximize coping skills, provide positive support system. Participant is able to verbalize types and ability to use techniques and  skills needed for reducing stress and depression.   Education: Stress, Anxiety, and Depression - Group verbal and visual presentation to define topics covered.  Reviews how body is impacted by stress, anxiety, and depression.  Also discusses healthy ways to reduce stress and to treat/manage anxiety and depression.  Written material given at graduation.   Education: Sleep Hygiene -Provides group verbal and written instruction about how sleep can affect your health.  Define sleep hygiene, discuss sleep cycles and impact of sleep habits. Review good sleep hygiene tips.    Initial Review & Psychosocial Screening:  Initial Psych Review & Screening - 12/05/20 1119       Initial Review   Current issues with Current Sleep Concerns;Current Stress Concerns    Source of Stress Concerns Chronic Illness;Unable to participate in former interests or hobbies    Comments Landra states that she is not depressed but is having a hard time coping with her legs being weak. She can look to her husband, two children and grand children that she can depend on.      Family Dynamics   Good Support System? Yes      Barriers   Psychosocial barriers to participate in program The patient should benefit from training in stress management and relaxation.      Screening Interventions   Interventions To provide support and resources with identified psychosocial needs;Provide feedback about the scores to participant;Encouraged to exercise    Expected Outcomes Short Term goal: Utilizing psychosocial counselor, staff and physician to assist with identification of specific Stressors or current issues interfering with healing process. Setting desired goal for each stressor or current issue identified.;Long Term Goal: Stressors or current issues are controlled or eliminated.;Short Term goal: Identification and review with participant of any Quality of Life or Depression concerns found by scoring the questionnaire.;Long Term goal:  The participant improves quality of Life and PHQ9 Scores as seen by post scores and/or verbalization of changes             Quality of Life Scores:  Scores of 19 and below usually indicate a poorer quality of life in these areas.  A difference of  2-3 points is a clinically meaningful difference.  A difference of 2-3 points in the total score of the Quality of Life Index has been associated with significant improvement in overall quality of life, self-image, physical symptoms, and general health in studies assessing change in quality of life.  PHQ-9: Recent Review Flowsheet Data     Depression screen [facility] 2/9 01/10/2021   Decreased Interest 0   Down, Depressed, Hopeless 0   PHQ - 2 Score 0   Altered sleeping 1   Tired, decreased energy 1   Change in appetite 0   Feeling bad or failure about yourself  1   Trouble concentrating 0   Moving slowly or fidgety/restless 0   Suicidal thoughts 0   PHQ-9 Score 3      Interpretation of Total Score  Total Score Depression Severity:  1-4 = Minimal depression, 5-9 = Mild depression, 10-14 = Moderate depression, 15-19 = Moderately severe depression, 20-27 = Severe depression   Psychosocial Evaluation and Intervention:  Psychosocial Evaluation - 12/05/20 1122       Psychosocial Evaluation & Interventions   Interventions Encouraged to exercise with the program and follow exercise prescription;Relaxation education;Stress management education    Comments Jenisis states that she is not depressed but is having a hard time coping with her legs being weak. She can look to her husband, two children and grand children that she can depend on.    Expected Outcomes Short: Start [facility] to help with mood. Long: Maintain a healthy mental state.    Continue Psychosocial Services  Follow up required by staff             Psychosocial Re-Evaluation:  Psychosocial Re-Evaluation     [place] Name 02/01/21 2122 02/20/21 1352           Psychosocial  Re-Evaluation   Current issues with Current Stress Concerns Current Stress Concerns      Comments Hannah Powell is holding up well mentally. Her leg weakness and arthritis are her biggest problems that limits her from moving and is frustrating for her. She is not traveling to her family in [place] due to her medical problems and is a little upset by it.  She is enjoying [facility] thus far and is thinking it'll help her tremendously. She takes Alprazolam is as needed and has not needed to take it in while. She reports no major stressors, but it causes her stress to not be able to do the things she used to do due to her legs - it is hard for her to get around. She gets frustrated with how long it takes her to do things. She enjoys to read ([place] [employer]), she is also doing a bible study. She relies on her husband and children as support for her. She reports sleeping well and very rarely has a sleepless night.      Expected Outcomes Short: Continue attendance with rehab Long: Continue to maintain posititive attitude and utilize exercise for stress management Short: Continue attendance with rehab Long: Continue to maintain posititive attitude and utilize exercise for stress management      Interventions Encouraged to attend Pulmonary Rehabilitation for the exercise Encouraged  to attend Pulmonary Rehabilitation for the exercise      Continue Psychosocial Services  Follow up required by staff Follow up required by staff        Initial Review   Source of Stress Concerns -- Chronic Illness;Unable to participate in former interests or hobbies               Psychosocial Discharge (Final Psychosocial Re-Evaluation):  Psychosocial Re-Evaluation - 02/20/21 1352       Psychosocial Re-Evaluation   Current issues with Current Stress Concerns    Comments She reports no major stressors, but it causes her stress to not be able to do the things she used to do due to her legs - it is hard for her to get around.  She gets frustrated with how long it takes her to do things. She enjoys to read ([place] [employer] [address]), she is also doing a bible study. She relies on her husband and children as support for her. She reports sleeping well and very rarely has a sleepless night.    Expected Outcomes Short: Continue attendance with rehab Long: Continue to maintain posititive attitude and utilize exercise for stress management    Interventions Encouraged to attend Pulmonary Rehabilitation for the exercise    Continue Psychosocial Services  Follow up required by staff      Initial Review   Source of Stress Concerns Chronic Illness;Unable to participate in former interests or hobbies             Education: Education Goals: Education classes will be provided on a weekly basis, covering required topics. Participant will state understanding/return demonstration of topics presented.  Learning Barriers/Preferences:  Learning Barriers/Preferences - 12/05/20 1118       Learning Barriers/Preferences   Learning Barriers None    Learning Preferences None             General Pulmonary Education Topics:  Infection Prevention: - Provides verbal and written material to individual with discussion of infection control including proper hand washing and proper equipment cleaning during exercise session. Flowsheet Row Pulmonary Rehab from 12/05/2020 in [facility] Cardiac and Pulmonary Rehab  Date 12/05/20  Educator [facility]  Instruction Review Code 1- Verbalizes Understanding       Falls Prevention: - Provides verbal and written material to individual with discussion of falls prevention and safety. Flowsheet Row Pulmonary Rehab from 12/05/2020 in [facility] Cardiac and Pulmonary Rehab  Date 12/05/20  Educator [facility]  Instruction Review Code 1- Verbalizes Understanding       Chronic Lung Disease Review: - Group verbal instruction with posters, models, PowerPoint presentations and videos,  to review new updates, new  respiratory medications, new advancements in procedures and treatments. Providing information on websites and "800" numbers for continued self-education. Includes information about supplement oxygen, available portable oxygen systems, continuous and intermittent flow rates, oxygen safety, concentrators, and Medicare reimbursement for oxygen. Explanation of Pulmonary Drugs, including class, frequency, complications, importance of spacers, rinsing mouth after steroid MDI's, and proper cleaning methods for nebulizers. Review of basic lung anatomy and physiology related to function, structure, and complications of lung disease. Review of risk factors. Discussion about methods for diagnosing sleep apnea and types of masks and machines for OSA. Includes a review of the use of types of environmental controls: home humidity, furnaces, filters, dust mite/pet prevention, HEPA vacuums. Discussion about weather changes, air quality and the benefits of nasal washing. Instruction on Warning signs, infection symptoms, calling MD promptly, preventive modes, and value of vaccinations.  Review of effective airway clearance, coughing and/or vibration techniques. Emphasizing that all should Create an Action Plan. Written material given at graduation. Flowsheet Row Pulmonary Rehab from 02/22/2021 in [facility] Cardiac and Pulmonary Rehab  Education need identified 01/10/21  Date 02/22/21  Educator [facility]  Instruction Review Code 1- Verbalizes Understanding       AED/CPR: - Group verbal and written instruction with the use of models to demonstrate the basic use of the AED with the basic ABC's of resuscitation.    Anatomy and Cardiac Procedures: - Group verbal and visual presentation and models provide information about basic cardiac anatomy and function. Reviews the testing methods done to diagnose heart disease and the outcomes of the test results. Describes the treatment choices: Medical Management, Angioplasty, or Coronary Bypass  Surgery for treating various heart conditions including Myocardial Infarction, Angina, Valve Disease, and Cardiac Arrhythmias.  Written material given at graduation.   Medication Safety: - Group verbal and visual instruction to review commonly prescribed medications for heart and lung disease. Reviews the medication, class of the drug, and side effects. Includes the steps to properly store meds and maintain the prescription regimen.  Written material given at graduation.   Other: -Provides group and verbal instruction on various topics (see comments)   Knowledge Questionnaire Score:  Knowledge Questionnaire Score - 01/10/21 1122       Knowledge Questionnaire Score   Pre Score 16/18: Exercise, O2              Core Components/Risk Factors/Patient Goals at Admission:  Personal Goals and Risk Factors at Admission - 01/10/21 1516       Core Components/Risk Factors/Patient Goals on Admission    Weight Management Yes;Weight Loss    Intervention Weight Management: Develop a combined nutrition and exercise program designed to reach desired caloric intake, while maintaining appropriate intake of nutrient and fiber, sodium and fats, and appropriate energy expenditure required for the weight goal.;Weight Management: Provide education and appropriate resources to help participant work on and attain dietary goals.;Weight Management/Obesity: Establish reasonable short term and long term weight goals.    Admit Weight 232 lb (105.2 kg)    Goal Weight: Short Term 227 lb (103 kg)    Goal Weight: Long Term 200 lb (90.7 kg)    Expected Outcomes Short Term: Continue to assess and modify interventions until short term weight is achieved;Long Term: Adherence to nutrition and physical activity/exercise program aimed toward attainment of established weight goal;Weight Loss: Understanding of general recommendations for a balanced deficit meal plan, which promotes 1-2 lb weight loss per week and includes a  negative energy balance of [phone] kcal/d;Understanding recommendations for meals to include 15-35% energy as protein, 25-35% energy from fat, 35-60% energy from carbohydrates, less than 255m of dietary cholesterol, 20-35 gm of total fiber daily;Understanding of distribution of calorie intake throughout the day with the consumption of 4-5 meals/snacks    Improve shortness of breath with ADL's Yes    Intervention Provide education, individualized exercise plan and daily activity instruction to help decrease symptoms of SOB with activities of daily living.    Expected Outcomes Short Term: Improve cardiorespiratory fitness to achieve a reduction of symptoms when performing ADLs;Long Term: Be able to perform more ADLs without symptoms or delay the onset of symptoms    Heart Failure Yes    Intervention Provide a combined exercise and nutrition program that is supplemented with education, support and counseling about heart failure. Directed toward relieving symptoms such as shortness of breath, decreased  exercise tolerance, and extremity edema.    Expected Outcomes Improve functional capacity of life;Short term: Attendance in program 2-3 days a week with increased exercise capacity. Reported lower sodium intake. Reported increased fruit and vegetable intake. Reports medication compliance.;Short term: Daily weights obtained and reported for increase. Utilizing diuretic protocols set by physician.;Long term: Adoption of self-care skills and reduction of barriers for early signs and symptoms recognition and intervention leading to self-care maintenance.    Hypertension Yes    Intervention Provide education on lifestyle modifcations including regular physical activity/exercise, weight management, moderate sodium restriction and increased consumption of fresh fruit, vegetables, and low fat dairy, alcohol moderation, and smoking cessation.;Monitor prescription use compliance.    Expected Outcomes Short Term:  Continued assessment and intervention until BP is < 140/34m HG in hypertensive participants. < 130/842mHG in hypertensive participants with diabetes, heart failure or chronic kidney disease.;Long Term: Maintenance of blood pressure at goal levels.             Education:Diabetes - Individual verbal and written instruction to review signs/symptoms of diabetes, desired ranges of glucose level fasting, after meals and with exercise. Acknowledge that pre and post exercise glucose checks will be done for 3 sessions at entry of program.   Know Your Numbers and Heart Failure: - Group verbal and visual instruction to discuss disease risk factors for cardiac and pulmonary disease and treatment options.  Reviews associated critical values for Overweight/Obesity, Hypertension, Cholesterol, and Diabetes.  Discusses basics of heart failure: signs/symptoms and treatments.  Introduces Heart Failure Zone chart for action plan for heart failure.  Written material given at graduation. Flow[facility]  Date 02/15/21  [facility]ruction Review Code 1- Verbalizes Understanding       Core Components/Risk Factors/Patient Goals Review:   Goals and Risk Factor Review     Row Name 02/01/21 1345 02/20/21 1344           Core Components/Risk Factors/Patient Goals Review   Personal Goals Review Hypertension;Weight Management/Obesity;Heart Failure Hypertension;Weight Management/Obesity;Heart Failure      Review ToVivien Rotas focusing more on portion control, drinking more water, and reducing her snacks to help her lose weight which is still a goal of hers. ToVivien Rotas weighing herself at home daily and is aware to note if she gains a lot of weight in a short period of time. Denies any heart failure symptoms at this time. She checks her BP at home and is running [id number] and 7084-16Siastolic. Her BPs at rehab are good as well. ToVivien Rotaas not met  with RD yet, new appointment scheduled for 12/12. ToVivien Rotas still weighing herself daily reporting no major changes - reminded to take note of major weight changes in short period of time. She continues to check her BP at home when she remembers, every couple of weeks. Her BP today was 138/78.      Expected Outcomes Short: Continue working towards weight loss Long: Manage life style risk factors ST: check BP regularly at home (when not at rehab) LT: manage lifestyle risk factors               Core Components/Risk Factors/Patient Goals at Discharge (Final Review):   Goals and Risk Factor Review - 02/20/21 1344       Core Components/Risk Factors/Patient Goals Review   Personal Goals Review Hypertension;Weight Management/Obesity;Heart Failure    Review ToVivien Rotaas not met with RD yet, new appointment scheduled  for 12/12. Hannah Powell is still weighing herself daily reporting no major changes - reminded to take note of major weight changes in short period of time. She continues to check her BP at home when she remembers, every couple of weeks. Her BP today was 138/78.    Expected Outcomes ST: check BP regularly at home (when not at rehab) LT: manage lifestyle risk factors             ITP Comments:  ITP Comments     Row Name 12/05/20 1115 12/05/20 1125 01/10/21 1114 01/18/21 0648 01/23/21 1354   ITP Comments Virtual Visit completed. Patient informed on EP and RD appointment and 6 Minute walk test. Patient also informed of patient health questionnaires on My Chart. Patient Verbalizes understanding. Visit diagnosis can be found in [facility] 11/18/[facility]. Patient is to call back when she gets out of her boot. She had broke her ankle from a fall and will call back in two weeks for clearance to exercise. Completed 6MWT and gym orientation. Initial ITP created and sent for review to Dr. Ottie Glazier, Medical Director. 30 Day review completed. Medical Director ITP review done, changes made as directed, and signed  approval by Medical Director.   New to program First full day of exercise!  Patient was oriented to gym and equipment including functions, settings, policies, and procedures.  Patient's individual exercise prescription and treatment plan were reviewed.  All starting workloads were established based on the results of the 6 minute walk test done at initial orientation visit.  The plan for exercise progression was also introduced and progression will be customized based on patient's performance and goals.    [facility] Name 02/15/21 0641 03/06/21 1421 03/15/21 0701 04/04/21 1533 04/12/21 0836   ITP Comments 30 Day review completed. Medical Director ITP review done, changes made as directed, and signed approval by Medical Director.    New to program Hannah Powell is now on medical hold for PT to improve strength and safety before finishing her rehab. 30 Day review completed. Medical Director ITP review done, changes made as directed, and signed approval by Medical Director. Hannah Powell is still working with PT and making good progress.  We will continue to keep her out on hold until her f/u with Dr. Lanney Gins next month. 30 Day review completed. Medical Director ITP review done, changes made as directed, and signed approval by Medical Director.   out for medical reasons    Row Name 05/10/21 0815 05/15/21 0742         ITP Comments 30 Day review completed. Medical Director ITP review done, changes made as directed, and signed approval by Medical Director.   remains out this month Hannah Powell is nearing the end of PT but has already started to go to [employer] on her off days.  She would like to discharge from the program at this time and continue with attending the gym independently.               Comments: Discharge ITP

## 2021-05-16 ENCOUNTER — Encounter: Payer: Medicare HMO | Admitting: Physical Therapy

## 2021-05-16 NOTE — Therapy (Signed)
[facility] [address] [place], [name], [address] Phone: [phone]   Fax:  [phone]  Physical Therapy Treatment  Patient Details  Name: Hannah Powell MRN: [id number] Date of Birth: [birth year] Referring Provider (PT): Dr. Ottie Glazier   Encounter Date: 05/15/2021   PT End of Session - 05/15/21 1520     Visit Number 19    Number of Visits 22    Date for PT Re-Evaluation 05/25/21    Authorization - Visit Number 9    Authorization - Number of Visits 10    PT Start Time 7322    PT Stop Time 1203    PT Time Calculation (min) 52 min    Activity Tolerance Patient tolerated treatment well;Patient limited by fatigue    Behavior During Therapy [facility] for tasks assessed/performed             Past Medical History:  Diagnosis Date   Anxiety    Arthritis    CHF (congestive heart failure) (HCC)    COPD (chronic obstructive pulmonary disease) ([place])    Dysplastic nevus 04/22/2019   R abdomen parallel to sup umbilicus - mod   Dysplastic nevus 04/22/2019   R low back above waistline - mild    Edema    feet/legs   Hypertension    dr Otho Najjar      Hypothyroidism    Leg weakness, bilateral    Osteoporosis    Oxygen deficit    2l with bipap at night   RLS (restless legs syndrome)    Shortness of breath    Sleep apnea    bipap   Wheezing     Past Surgical History:  Procedure Laterality Date   BACK SURGERY     cervical   BREAST BIOPSY Left    needle bx-neg   CATARACT EXTRACTION W/PHACO Left 05/07/2017   Procedure: CATARACT EXTRACTION PHACO AND INTRAOCULAR LENS PLACEMENT ([place]);  Surgeon: Birder Robson, MD;  Location: [facility];  Service: Ophthalmology;  Laterality: Left;  [place] 00:48.7 AP% 11.3 CDE 5.46 Fluid Pack Lot # [id number]   CATARACT EXTRACTION W/PHACO Right 04/23/2017   Procedure: CATARACT EXTRACTION PHACO AND INTRAOCULAR LENS PLACEMENT ([facility]);  Surgeon: Birder Robson, MD;  Location: [facility];  Service:  Ophthalmology;  Laterality: Right;  [place] 00:29 AP% 12.8 CDE 3.79 FLUID PACK LOT # [id number]   FOOT ARTHROPLASTY     JOINT REPLACEMENT     x2 tkr   TOTAL SHOULDER ARTHROPLASTY Left 07/02/2013   Procedure: LEFT TOTAL SHOULDER ARTHROPLASTY;  Surgeon: Marin Shutter, MD;  Location: [place];  Service: Orthopedics;  Laterality: Left;   TOTAL SHOULDER ARTHROPLASTY Right 04/10/2018   Procedure: TOTAL REVERSE SHOULDER ARTHROPLASTY;  Surgeon: Justice Britain, MD;  Location: [facility];  Service: Orthopedics;  Laterality: Right;  119mn    There were no vitals filed for this visit.   Subjective Assessment - 05/15/21 1214     Subjective Patient reports no new complaints.  Patient reports being active at church/with family this weekend.    Pertinent History extensive PMH.  Pt. was participating with Pulmonary Rehab and pt. states that she is hoping to return to Pulmonary Rehab when appropriate.  Pt. states they were concerned about her LE strength/ balance getting on/off equipment.    Limitations Standing;Walking;House hold activities    Patient Stated Goals Increase B LE muscle strength/ return to Pulmonary Rehab    Currently in Pain? No/denies  Ther.ex.:   Seated shoulder flexion/ chest press with 3# wand 20x with mirror feedback for upright posture.          Seated/ standing ex. 4# LE ex.: LAQ/ marching/ hip flexion/ heel raises 20x in //-bars for safety.    Nustep L4 Seat 10 for 12 min. With B UE/LE- consistent cadence.       Neuro:   Walking in //-bars: marching/ lateral walking 3 laps each.  Cuing for posture.  Pt. Requires frequent seated rest breaks.  O2 sat. Remained higher today (>95%)   Sit to stands from gray chair with 2 inch Airex pad (5x)- fatigue.  Requires L UE assist on arm rest for support/ proper technique.        Standing ball toss in //-bars (CGA for safety).  Functional reaching in //-bars with no UE assist to cones (varying positions).     Walking in clinic/  outside to car with use of RW.         PT Long Term Goals - 04/28/21 1942       PT LONG TERM GOAL #1   Title Pt. will improve 5XSTS to <15 sec to improve functional mobility.    Baseline >20 sec.    Time 4    Period Weeks    Status Partially Met    Target Date 05/25/21      PT LONG TERM GOAL #2   Title Pt. will increase B LE muscle strength 1/2 muscle grade to improve safety/ independence with functional mobility.    Baseline neurogenic claudication. Pt. presents with good strength in bilat. hip add and abd, bilat DF, and quadriceps. Pt. has 4/5 strength in B hip flex, 4+/5 in R knee flex, and R inversion. Pt. has 4/5 strength with L knee flex, R eversion, and L inverison.    Time 4    Period Weeks    Status Partially Met    Target Date 05/25/21      PT LONG TERM GOAL #3   Title Pt. will demonstrate improved normal gait speed to 0.4 m/s to decrease falls risk and improve ability to safely walk in community.    Time 4    Period Weeks    Status Not Met    Target Date 05/25/21      PT LONG TERM GOAL #4   Title Pt. will demonstrate safety with ascending 4 stairs with at most one handrail to improve community ambulation.    Baseline Pt able to ascend/ descend stairs with both handrails and lateral step to pattern.    Time 4    Period Weeks    Status Partially Met    Target Date 05/25/21                   Plan - 05/15/21 1521     Clinical Impression Statement Pt. continues to work hard during tx. to increase LE strength/ walking endurance with mod. I and use of RW.  Pt. able to complete 3 laps during all dynamic/ walking tasks in //-bars prior to seated rest break.  Pts. O2 sat. remains in WNL during tx. session.  PT discussed benefits of aquatic ex. to improve L knee control/ decrease fear of buckling during community ambulation.  No c/o knee pain but pt. remains cautious with lunges/ wt. shifting.    Personal Factors and Comorbidities Age;Comorbidity 3+     Comorbidities CPD, HTN, Obesity    Stability/Clinical Decision Making Evolving/Moderate complexity  Clinical Decision Making Moderate    Rehab Potential Fair    PT Frequency 2x / week    PT Duration 8 weeks    PT Treatment/Interventions Cryotherapy;Electrical Stimulation;Ultrasound;Moist Heat;Gait training;Functional mobility training;Therapeutic activities;Therapeutic exercise;Neuromuscular re-education;Scar mobilization;Balance training;Stair training;Patient/family education;Manual techniques;ADLs/Self Care Home Management    PT Next Visit Plan Dynamic balance tasks/ outside walking on ramp/ curb.  10th visit progress note next tx./ [facility] authorization.    Consulted and Agree with Plan of Care Patient             Patient will benefit from skilled therapeutic intervention in order to improve the following deficits and impairments:  Decreased endurance, Decreased range of motion, Decreased skin integrity, Decreased strength, Hypomobility, Impaired UE functional use, Increased fascial restricitons, Pain, Postural dysfunction, Impaired flexibility, Decreased scar mobility, Decreased mobility, Decreased balance, Improper body mechanics, Abnormal gait, Decreased activity tolerance, Impaired sensation  Visit Diagnosis: Muscle weakness (generalized)  Gait difficulty  Abnormal posture  Imbalance     Problem List Patient Active Problem List   Diagnosis Date Noted   S/P reverse total shoulder arthroplasty, 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   Chronic diastolic heart failure ([place]) 08/09/2016   COPD (chronic obstructive pulmonary disease) ([place]) 08/09/2016   Pulmonary infiltrates    Pneumonia due to Streptococcus pneumoniae ([place])    Fever    Community acquired pneumonia of right lung    Palliative care by specialist    Goals of care, counseling/discussion    Pressure injury of skin 07/03/2016   COPD exacerbation ([place]) 07/21/2015   Acute renal insufficiency 07/21/2015   Hyperglycemia 07/21/2015    Benign essential HTN 07/21/2015   S/P shoulder replacement 07/02/2013   Pura Spice, PT, DPT # [id number] 05/16/2021, 11:53 AM  [facility] [address] [place], [name], [address] Phone: [phone]   Fax:  [phone]  Name: Hannah Powell MRN: [id number] Date of Birth: July 17, 1945

## 2021-05-17 ENCOUNTER — Other Ambulatory Visit: Payer: Self-pay

## 2021-05-17 ENCOUNTER — Ambulatory Visit: Payer: Medicare HMO | Attending: Pulmonary Disease | Admitting: Physical Therapy

## 2021-05-17 DIAGNOSIS — R269 Unspecified abnormalities of gait and mobility: Secondary | ICD-10-CM | POA: Diagnosis not present

## 2021-05-17 DIAGNOSIS — R293 Abnormal posture: Secondary | ICD-10-CM | POA: Diagnosis not present

## 2021-05-17 DIAGNOSIS — M6281 Muscle weakness (generalized): Secondary | ICD-10-CM | POA: Diagnosis not present

## 2021-05-17 DIAGNOSIS — R2689 Other abnormalities of gait and mobility: Secondary | ICD-10-CM | POA: Diagnosis not present

## 2021-05-18 ENCOUNTER — Encounter: Payer: Medicare HMO | Admitting: Physical Therapy

## 2021-05-18 ENCOUNTER — Encounter: Payer: Self-pay | Admitting: Physical Therapy

## 2021-05-18 DIAGNOSIS — R1084 Generalized abdominal pain: Secondary | ICD-10-CM | POA: Diagnosis not present

## 2021-05-18 NOTE — Therapy (Signed)
[facility] [name] [place], [name], [address] Phone: [phone]   Fax:  [phone]  Physical Therapy Treatment Physical Therapy Progress Note   Dates of reporting period  04/06/21  to  05/17/21  Patient Details  Name: Hannah Powell MRN: [id number] Date of Birth: 1945-10-23 Referring Provider (PT): Dr. Ottie Glazier   Encounter Date: 05/17/2021   PT End of Session - 05/18/21 1336     Visit Number 20    Number of Visits 22    Date for PT Re-Evaluation 05/25/21    Authorization - Visit Number 10    Authorization - Number of Visits 10    PT Start Time 1129    PT Stop Time 1210    PT Time Calculation (min) 41 min    Activity Tolerance Patient tolerated treatment well;Patient limited by fatigue    Behavior During Therapy [facility] for tasks assessed/performed             Past Medical History:  Diagnosis Date   Anxiety    Arthritis    CHF (congestive heart failure) (HCC)    COPD (chronic obstructive pulmonary disease) ([place])    Dysplastic nevus 04/22/2019   R abdomen parallel to sup umbilicus - mod   Dysplastic nevus 04/22/2019   R low back above waistline - mild    Edema    feet/legs   Hypertension    dr Otho Najjar      Hypothyroidism    Leg weakness, bilateral    Osteoporosis    Oxygen deficit    2l with bipap at night   RLS (restless legs syndrome)    Shortness of breath    Sleep apnea    bipap   Wheezing     Past Surgical History:  Procedure Laterality Date   BACK SURGERY     cervical   BREAST BIOPSY Left    needle bx-neg   CATARACT EXTRACTION W/PHACO Left 05/07/2017   Procedure: CATARACT EXTRACTION PHACO AND INTRAOCULAR LENS PLACEMENT ([place]);  Surgeon: Birder Robson, MD;  Location: [facility];  Service: Ophthalmology;  Laterality: Left;  [place] 00:48.7 AP% 11.3 CDE 5.46 Fluid Pack Lot # [id number] H   CATARACT EXTRACTION W/PHACO Right 04/23/2017   Procedure: CATARACT EXTRACTION PHACO AND INTRAOCULAR LENS  PLACEMENT ([facility]);  Surgeon: Birder Robson, MD;  Location: [facility];  Service: Ophthalmology;  Laterality: Right;  [place] 00:29 AP% 12.8 CDE 3.79 FLUID PACK LOT # [id number] H   FOOT ARTHROPLASTY     JOINT REPLACEMENT     x2 tkr   TOTAL SHOULDER ARTHROPLASTY Left 07/02/2013   Procedure: LEFT TOTAL SHOULDER ARTHROPLASTY;  Surgeon: Marin Shutter, MD;  Location: [facility];  Service: Orthopedics;  Laterality: Left;   TOTAL SHOULDER ARTHROPLASTY Right 04/10/2018   Procedure: TOTAL REVERSE SHOULDER ARTHROPLASTY;  Surgeon: Justice Britain, MD;  [facility];  Service: Orthopedics;  Laterality: Right;  192mn    There were no vitals filed for this visit.   Subjective Assessment - 05/18/21 1101     Subjective Pt. reports compliance with ex. program.  PT discussed putting PT tx. on hold and having pt. focus on gym based/ HEP.    Pertinent History extensive PMH.  Pt. was participating with Pulmonary Rehab and pt. states that she is hoping to return to Pulmonary Rehab when appropriate.  Pt. states they were concerned about her LE strength/ balance getting on/off equipment.    Limitations Standing;Walking;House hold activities    Patient Stated Goals Increase  B LE muscle strength/ return to Pulmonary Rehab    Currently in Pain? No/denies             Ther.ex.:   Standing Nautilus: 30# lat. Pull downs/ 30# scap. Retraction/ 20# tricep extension 20x each.  Focus on standing balance/ tolerance/ upright posture.          Seated 2# bilateral UE shoulder flexion/ chest press 20x each.    Nustep L4 Seat 10 for 10 min. With B UE/LE- consistent cadence.       Neuro:   Walking in //-bars: marching/ lateral walking 3 laps each.  Mirror feedback for upright posture. O2 sat. Remained higher today (>95%).   Sit to stands from blue mat table (5x2)- fatigue noted.  No UE assist today.     Reviewed HEP/ updated goals.     Walking in clinic/ outside to car with use of RW.          PT Long Term Goals -  05/18/21 1440       PT LONG TERM GOAL #1   Title Pt. will improve 5XSTS to <15 sec to improve functional mobility.    Baseline >20 sec.    Time 4    Period Weeks    Status Partially Met    Target Date 05/25/21      PT LONG TERM GOAL #2   Title Pt. will increase B LE muscle strength 1/2 muscle grade to improve safety/ independence with functional mobility.    Baseline neurogenic claudication. Pt. presents with good strength in bilat. hip add and abd, bilat DF, and quadriceps. Pt. has 4/5 strength in B hip flex, 4+/5 in R knee flex, and R inversion. Pt. has 4/5 strength with L knee flex, R eversion, and L inverison.    Time 4    Period Weeks    Status Partially Met    Target Date 05/25/21      PT LONG TERM GOAL #3   Title Pt. will demonstrate improved normal gait speed to 0.4 m/s to decrease falls risk and improve ability to safely walk in community.    Baseline No falls but slow, controlled gait pattern with heavy UE assist on RW.  Significant forward head, rounded sh. posture.    Time 4    Period Weeks    Status Not Met    Target Date 05/25/21      PT LONG TERM GOAL #4   Title Pt. will demonstrate safety with ascending 4 stairs with at most one handrail to improve community ambulation.    Baseline Pt able to ascend/ descend stairs with both handrails and lateral step to pattern.    Time 4    Period Weeks    Status Partially Met    Target Date 05/25/21                Plan - 05/18/21 1441     Clinical Impression Statement Pt. has worked hard during PT tx. over past 2 months.  Pt. is currently participating with an independent based ther.ex. program/ bike.  Pt. is hoping to start going to pool at local gym.  Pt. will be put on hold with skilled PT services at this time and instructed to contact PT if any regression in symptoms or questions.  No episodes of L knee buckling during tx. and pt. is focused during standing ther.ex/ squats.  See updated goals.    Personal Factors  and Comorbidities Age;Comorbidity 3+    Comorbidities  CPD, HTN, Obesity    Stability/Clinical Decision Making Evolving/Moderate complexity    Clinical Decision Making Moderate    Rehab Potential Fair    PT Frequency 2x / week    PT Duration 8 weeks    PT Treatment/Interventions Cryotherapy;Electrical Stimulation;Ultrasound;Moist Heat;Gait training;Functional mobility training;Therapeutic activities;Therapeutic exercise;Neuromuscular re-education;Scar mobilization;Balance training;Stair training;Patient/family education;Manual techniques;ADLs/Self Care Home Management    PT Next Visit Plan Pt. will contact PT in 2 weeks    Consulted and Agree with Plan of Care Patient             Patient will benefit from skilled therapeutic intervention in order to improve the following deficits and impairments:  Decreased endurance, Decreased range of motion, Decreased skin integrity, Decreased strength, Hypomobility, Impaired UE functional use, Increased fascial restricitons, Pain, Postural dysfunction, Impaired flexibility, Decreased scar mobility, Decreased mobility, Decreased balance, Improper body mechanics, Abnormal gait, Decreased activity tolerance, Impaired sensation  Visit Diagnosis: Muscle weakness (generalized)  Gait difficulty  Abnormal posture  Imbalance     Problem List Patient Active Problem List   Diagnosis Date Noted   S/P reverse total shoulder arthroplasty, 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   Chronic diastolic heart failure ([place]) 08/09/2016   COPD (chronic obstructive pulmonary disease) ([place]) 08/09/2016   Pulmonary infiltrates    Pneumonia due to Streptococcus pneumoniae (HCC)    Fever    Community acquired pneumonia of right lung    Palliative care by specialist    Goals of care, counseling/discussion    Pressure injury of skin 07/03/2016   COPD exacerbation ([place]) 07/21/2015   Acute renal insufficiency 07/21/2015   Hyperglycemia 07/21/2015   Benign essential HTN 07/21/2015   S/P  shoulder replacement 07/02/2013   Pura Spice, PT, DPT # [id number] 05/18/2021, 2:46 PM  [facility] [address]. [place], [name], [address] Phone: [phone]   Fax:  [phone]  Name: Hannah Powell MRN: [id number] Date of Birth: 06/24/1945

## 2021-06-06 DIAGNOSIS — E039 Hypothyroidism, unspecified: Secondary | ICD-10-CM | POA: Diagnosis not present

## 2021-06-19 DIAGNOSIS — M5416 Radiculopathy, lumbar region: Secondary | ICD-10-CM | POA: Diagnosis not present

## 2021-06-20 DIAGNOSIS — J449 Chronic obstructive pulmonary disease, unspecified: Secondary | ICD-10-CM | POA: Diagnosis not present

## 2021-06-22 ENCOUNTER — Encounter: Payer: Self-pay | Admitting: Dermatology

## 2021-06-22 ENCOUNTER — Ambulatory Visit: Payer: Medicare HMO | Admitting: Dermatology

## 2021-06-22 DIAGNOSIS — D692 Other nonthrombocytopenic purpura: Secondary | ICD-10-CM | POA: Diagnosis not present

## 2021-06-22 DIAGNOSIS — D229 Melanocytic nevi, unspecified: Secondary | ICD-10-CM | POA: Diagnosis not present

## 2021-06-22 DIAGNOSIS — L817 Pigmented purpuric dermatosis: Secondary | ICD-10-CM

## 2021-06-22 DIAGNOSIS — Z1283 Encounter for screening for malignant neoplasm of skin: Secondary | ICD-10-CM

## 2021-06-22 DIAGNOSIS — L820 Inflamed seborrheic keratosis: Secondary | ICD-10-CM

## 2021-06-22 DIAGNOSIS — L578 Other skin changes due to chronic exposure to nonionizing radiation: Secondary | ICD-10-CM | POA: Diagnosis not present

## 2021-06-22 DIAGNOSIS — D1800 Hemangioma unspecified site: Secondary | ICD-10-CM

## 2021-06-22 DIAGNOSIS — L821 Other seborrheic keratosis: Secondary | ICD-10-CM | POA: Diagnosis not present

## 2021-06-22 DIAGNOSIS — L918 Other hypertrophic disorders of the skin: Secondary | ICD-10-CM

## 2021-06-22 DIAGNOSIS — I872 Venous insufficiency (chronic) (peripheral): Secondary | ICD-10-CM

## 2021-06-22 DIAGNOSIS — L814 Other melanin hyperpigmentation: Secondary | ICD-10-CM | POA: Diagnosis not present

## 2021-06-22 DIAGNOSIS — Z86018 Personal history of other benign neoplasm: Secondary | ICD-10-CM

## 2021-06-22 NOTE — Progress Notes (Signed)
? ?Follow-Up Visit ?  ?Subjective  ?Hannah Powell is a 76 y.o. female who presents for the following: Annual Exam (Hx of dysplastic nevi. No H/O skin cancer). ?The patient presents for Total-Body Skin Exam (TBSE) for skin cancer screening and mole check.  The patient has spots, moles and lesions to be evaluated, some may be new or changing and the patient has concerns that these could be cancer. ?Husband with patient.  ? ?The following portions of the chart were reviewed this encounter and updated as appropriate:  Tobacco  Allergies  Meds  Problems  Med Hx  Surg Hx  Fam Hx   ?  ?Review of Systems: No other skin or systemic complaints except as noted in HPI or Assessment and Plan. ? ?Objective  ?Well appearing patient in no apparent distress; mood and affect are within normal limits. ? ?A full examination was performed including scalp, head, eyes, ears, nose, lips, neck, chest, axillae, abdomen, back, buttocks, bilateral upper extremities, bilateral lower extremities, hands, feet, fingers, toes, fingernails, and toenails. All findings within normal limits unless otherwise noted below. ? ?Right Brow x1 ?Erythematous keratotic or waxy stuck-on papule or plaque. ? ? ?Assessment & Plan  ? ?Lentigines ?- Scattered tan macules ?- Due to sun exposure ?- Benign-appearing, observe ?- Recommend daily broad spectrum sunscreen SPF 30+ to sun-exposed areas, reapply every 2 hours as needed. ?- Call for any changes ? ?Seborrheic Keratoses ?- Stuck-on, waxy, tan-brown papules and/or plaques  ?- Benign-appearing ?- Discussed benign etiology and prognosis. ?- Observe ?- Call for any changes ? ?Melanocytic Nevi ?- Tan-brown and/or pink-flesh-colored symmetric macules and papules ?- Benign appearing on exam today ?- Observation ?- Call clinic for new or changing moles ?- Recommend daily use of broad spectrum spf 30+ sunscreen to sun-exposed areas.  ? ?Hemangiomas ?- Red papules ?- Discussed benign nature ?- Observe ?- Call  for any changes ? ?Actinic Damage ?- Chronic condition, secondary to cumulative UV/sun exposure ?- diffuse scaly erythematous macules with underlying dyspigmentation ?- Recommend daily broad spectrum sunscreen SPF 30+ to sun-exposed areas, reapply every 2 hours as needed.  ?- Staying in the shade or wearing long sleeves, sun glasses (UVA+UVB protection) and wide brim hats (4-inch brim around the entire circumference of the hat) are also recommended for sun protection.  ?- Call for new or changing lesions. ? ?Skin cancer screening performed today. ? ?History of Dysplastic Nevi ?- No evidence of recurrence today at right abdomen and right low back ?- Recommend regular full body skin exams ?- Recommend daily broad spectrum sunscreen SPF 30+ to sun-exposed areas, reapply every 2 hours as needed.  ?- Call if any new or changing lesions are noted between office visits  ? ?Inflamed seborrheic keratosis ?Right Brow x1 ?Destruction of lesion - Right Brow x1 ?Complexity: simple   ?Destruction method: cryotherapy   ?Informed consent: discussed and consent obtained   ?Timeout:  patient name, date of birth, surgical site, and procedure verified ?Lesion destroyed using liquid nitrogen: Yes   ?Region frozen until ice ball extended beyond lesion: Yes   ?Outcome: patient tolerated procedure well with no complications   ?Post-procedure details: wound care instructions given   ? ?Venous stasis dermatitis of lower extremities ?Lower Legs - Anterior ?With schamberg's purpura and varices ?Stasis in the legs causes chronic leg swelling, which may result in itchy or painful rashes, skin discoloration, skin texture changes, and sometimes ulceration.  Recommend daily graduated compression hose/stockings- easiest to put on first thing in morning,  remove at bedtime.  Elevate legs as much as possible. Avoid salt/sodium rich foods. ? ?Skin cancer screening ? ?Purpura - Chronic; persistent and recurrent.  Treatable, but not curable. ?- Violaceous  macules and patches at arms ?- Benign ?- Related to trauma, age, sun damage and/or use of blood thinners, chronic use of topical and/or oral steroids ?- Observe ?- Can use OTC arnica containing moisturizer such as Dermend Bruise Formula if desired ?- Call for worsening or other concerns ? ?Acrochordons (Skin Tags) ?- Fleshy, skin-colored pedunculated papules ?- Benign appearing.  ?- Observe. ?- If desired, they can be removed with an in office procedure that is not covered by insurance. ?- Please call the clinic if you notice any new or changing lesions.  ? ?Return for TBSE 1-2 years or PRN. ? ?I, Emelia Salisbury, CMA, am acting as scribe for Sarina Ser, MD. ?Documentation: I have reviewed the above documentation for accuracy and completeness, and I agree with the above. ? ?Sarina Ser, MD ? ? ?

## 2021-06-22 NOTE — Patient Instructions (Addendum)
Cryotherapy Aftercare ? ?Wash gently with soap and water everyday.   ?Apply Vaseline and Band-Aid daily until healed.  ? ?Prior to procedure, discussed risks of blister formation, small wound, skin dyspigmentation, or rare scar following cryotherapy. Recommend Vaseline ointment to treated areas while healing.  ? ? ?Stasis in the legs causes chronic leg swelling, which may result in itchy or painful rashes, skin discoloration, skin texture changes, and sometimes ulceration.  Recommend daily graduated compression hose/stockings- easiest to put on first thing in morning, remove at bedtime.  Elevate legs as much as possible. Avoid salt/sodium rich foods.  ? ? ?Recommend daily broad spectrum sunscreen SPF 30+ to sun-exposed areas, reapply every 2 hours as needed. Call for new or changing lesions.  ?Staying in the shade or wearing long sleeves, sun glasses (UVA+UVB protection) and wide brim hats (4-inch brim around the entire circumference of the hat) are also recommended for sun protection.  ? ? ?Melanoma ABCDEs ? ?Melanoma is the most dangerous type of skin cancer, and is the leading cause of death from skin disease.  You are more likely to develop melanoma if you: ?Have light-colored skin, light-colored eyes, or red or blond hair ?Spend a lot of time in the sun ?Tan regularly, either outdoors or in a tanning bed ?Have had blistering sunburns, especially during childhood ?Have a close family member who has had a melanoma ?Have atypical moles or large birthmarks ? ?Early detection of melanoma is key since treatment is typically straightforward and cure rates are extremely high if we catch it early.  ? ?The first sign of melanoma is often a change in a mole or a new dark spot.  The ABCDE system is a way of remembering the signs of melanoma. ? ?A for asymmetry:  The two halves do not match. ?B for border:  The edges of the growth are irregular. ?C for color:  A mixture of colors are present instead of an even brown  color. ?D for diameter:  Melanomas are usually (but not always) greater than 6m - the size of a pencil eraser. ?E for evolution:  The spot keeps changing in size, shape, and color. ? ?Please check your skin once per month between visits. You can use a small mirror in front and a large mirror behind you to keep an eye on the back side or your body.  ? ?If you see any new or changing lesions before your next follow-up, please call to schedule a visit. ? ?Please continue daily skin protection including broad spectrum sunscreen SPF 30+ to sun-exposed areas, reapplying every 2 hours as needed when you're outdoors.  ? ?Staying in the shade or wearing long sleeves, sun glasses (UVA+UVB protection) and wide brim hats (4-inch brim around the entire circumference of the hat) are also recommended for sun protection.   ? ? ?If You Need Anything After Your Visit ? ?If you have any questions or concerns for your doctor, please call our main line at [phone] press option 4 to reach your doctor's medical assistant. If no one answers, please leave a voicemail as directed and we will return your call as soon as possible. Messages left after 4 pm will be answered the following business day.  ? ?You may also send [place] message via MyChart. We typically respond to MyChart messages within 1-2 business days. ? ?For prescription refills, please ask your pharmacy to contact our office. Our fax number is [phone] ? ?If you have an urgent issue when the  clinic is closed that cannot wait until the next business day, you can page your doctor at the number below.   ? ?Please note that while we do our best to be available for urgent issues outside of office hours, we are not available 24/7.  ? ?If you have an urgent issue and are unable to reach [place], you may choose to seek medical care at your doctor's office, retail clinic, urgent care center, or emergency room. ? ?If you have a medical emergency, please immediately call 911 or go to  the emergency department. ? ?Pager Numbers ? ?- Dr. Nehemiah Massed: [phone] ? ?- Dr. Laurence Ferrari: [phone] ? ?- Dr. Nicole Kindred: [phone] ? ?In the event of inclement weather, please call our main line at [phone] for an update on the status of any delays or closures. ? ?Dermatology Medication Tips: ?Please keep the boxes that topical medications come in in order to help keep track of the instructions about where and how to use these. Pharmacies typically print the medication instructions only on the boxes and not directly on the medication tubes.  ? ?If your medication is too expensive, please contact our office at [phone] option 4 or send [place] a message through [place].  ? ?We are unable to tell what your co-pay for medications will be in advance as this is different depending on your insurance coverage. However, we may be able to find a substitute medication at lower cost or fill out paperwork to get insurance to cover a needed medication.  ? ?If a prior authorization is required to get your medication covered by your insurance company, please allow [place] 1-2 business days to complete this process. ? ?Drug prices often vary depending on where the prescription is filled and some pharmacies may offer cheaper prices. ? ?The website www.goodrx.com contains coupons for medications through different pharmacies. The prices here do not account for what the cost may be with help from insurance (it may be cheaper with your insurance), but the website can give you the price if you did not use any insurance.  ?- You can print the associated coupon and take it with your prescription to the pharmacy.  ?- You may also stop by our office during regular business hours and pick up a GoodRx coupon card.  ?- If you need your prescription sent electronically to a different pharmacy, notify our office through [facility] or by phone at [phone] option 4. ? ? ? ? ?Si Usted Necesita Algo Despu?s de Su Visita ? ?Tambi?n puede  enviarnos un mensaje a trav?s de MyChart. Por lo general respondemos a los mensajes de MyChart en el transcurso de 1 a 2 d?as h?biles. ? ?Para renovar recetas, por favor pida a su farmacia que se ponga en contacto con nuestra oficina. Nuestro n?mero de fax es el [phone]. ? ?Si tiene un asunto urgente cuando la cl?nica est? cerrada y que no puede esperar hasta el siguiente d?a h?bil, puede llamar/localizar a su doctor(a) al n?mero que aparece a continuaci?n.  ? ?Por favor, tenga en cuenta que aunque hacemos todo lo posible para estar disponibles para asuntos urgentes fuera del horario de oficina, no estamos disponibles las 24 horas del d?a, los 7 d?as de la semana.  ? ?Si tiene un problema urgente y no puede comunicarse con nosotros, puede optar por buscar atenci?n m?dica  en el consultorio de su doctor(a), en una cl?nica privada, en un centro de atenci?n urgente o en una sala de emergencias. ? ?Si tiene [employer]  emergencia m?dica, por favor llame inmediatamente al 911 o vaya a la sala de emergencias. ? ?N?meros de b?per ? ?- Dr. Nehemiah Massed: [phone] ? ?- Dra. Moye: [phone] ? ?- Dra. Nicole Kindred: [phone] ? ?En caso de inclemencias del tiempo, por favor llame a nuestra l?nea principal al [phone] para una actualizaci?n sobre el estado de cualquier retraso o cierre. ? ?Consejos para la medicaci?n en dermatolog?a: ?Por favor, guarde las cajas en las que vienen los medicamentos de uso t?pico para ayudarle a seguir las instrucciones sobre d?nde y c?mo usarlos. Las farmacias generalmente imprimen las instrucciones del medicamento s?lo en las cajas y no directamente en los tubos del [place].  ? ?Si su medicamento es muy caro, por favor, p?ngase en contacto con Zigmund Daniel llamando al [phone] y presione la opci?n 4 o env?enos un mensaje a trav?s de MyChart.  ? ?No podemos decirle cu?l ser? su copago por los medicamentos por adelantado ya que esto es diferente dependiendo de la cobertura de su seguro.  Sin embargo, es posible que podamos encontrar un medicamento sustituto a Electrical engineer un formulario para que el seguro cubra el medicamento que se considera necesario.  ? ?Si se requiere Programme researcher, broadcasting/film/video

## 2021-06-23 ENCOUNTER — Encounter: Payer: Self-pay | Admitting: Dermatology

## 2021-07-17 DIAGNOSIS — E039 Hypothyroidism, unspecified: Secondary | ICD-10-CM | POA: Diagnosis not present

## 2021-07-31 DIAGNOSIS — R2242 Localized swelling, mass and lump, left lower limb: Secondary | ICD-10-CM | POA: Diagnosis not present

## 2021-07-31 DIAGNOSIS — J9611 Chronic respiratory failure with hypoxia: Secondary | ICD-10-CM | POA: Diagnosis not present

## 2021-07-31 DIAGNOSIS — J449 Chronic obstructive pulmonary disease, unspecified: Secondary | ICD-10-CM | POA: Diagnosis not present

## 2021-07-31 DIAGNOSIS — I509 Heart failure, unspecified: Secondary | ICD-10-CM | POA: Diagnosis not present

## 2021-07-31 DIAGNOSIS — I110 Hypertensive heart disease with heart failure: Secondary | ICD-10-CM | POA: Diagnosis not present

## 2021-07-31 DIAGNOSIS — Z87891 Personal history of nicotine dependence: Secondary | ICD-10-CM | POA: Diagnosis not present

## 2021-08-03 ENCOUNTER — Other Ambulatory Visit: Payer: Self-pay | Admitting: Internal Medicine

## 2021-08-03 DIAGNOSIS — R197 Diarrhea, unspecified: Secondary | ICD-10-CM | POA: Diagnosis not present

## 2021-08-03 DIAGNOSIS — K909 Intestinal malabsorption, unspecified: Secondary | ICD-10-CM | POA: Diagnosis not present

## 2021-08-03 DIAGNOSIS — I5032 Chronic diastolic (congestive) heart failure: Secondary | ICD-10-CM | POA: Diagnosis not present

## 2021-08-03 DIAGNOSIS — J9611 Chronic respiratory failure with hypoxia: Secondary | ICD-10-CM | POA: Diagnosis not present

## 2021-08-03 DIAGNOSIS — R195 Other fecal abnormalities: Secondary | ICD-10-CM | POA: Diagnosis not present

## 2021-08-03 DIAGNOSIS — J438 Other emphysema: Secondary | ICD-10-CM | POA: Diagnosis not present

## 2021-08-11 ENCOUNTER — Other Ambulatory Visit: Payer: Self-pay

## 2021-08-11 ENCOUNTER — Emergency Department: Payer: Medicare HMO

## 2021-08-11 ENCOUNTER — Emergency Department
Admission: EM | Admit: 2021-08-11 | Discharge: 2021-08-11 | Disposition: A | Discharge status: Home / Self Care | Payer: Medicare HMO | Attending: Emergency Medicine | Admitting: Emergency Medicine

## 2021-08-11 DIAGNOSIS — I130 Hypertensive heart and chronic kidney disease with heart failure and stage 1 through stage 4 chronic kidney disease, or unspecified chronic kidney disease: Secondary | ICD-10-CM | POA: Insufficient documentation

## 2021-08-11 DIAGNOSIS — R600 Localized edema: Secondary | ICD-10-CM | POA: Insufficient documentation

## 2021-08-11 DIAGNOSIS — R110 Nausea: Secondary | ICD-10-CM | POA: Insufficient documentation

## 2021-08-11 DIAGNOSIS — R0789 Other chest pain: Secondary | ICD-10-CM | POA: Diagnosis not present

## 2021-08-11 DIAGNOSIS — I509 Heart failure, unspecified: Secondary | ICD-10-CM | POA: Insufficient documentation

## 2021-08-11 DIAGNOSIS — J449 Chronic obstructive pulmonary disease, unspecified: Secondary | ICD-10-CM | POA: Diagnosis not present

## 2021-08-11 DIAGNOSIS — R072 Precordial pain: Secondary | ICD-10-CM | POA: Diagnosis not present

## 2021-08-11 DIAGNOSIS — N189 Chronic kidney disease, unspecified: Secondary | ICD-10-CM | POA: Insufficient documentation

## 2021-08-11 DIAGNOSIS — I10 Essential (primary) hypertension: Secondary | ICD-10-CM | POA: Diagnosis not present

## 2021-08-11 DIAGNOSIS — E039 Hypothyroidism, unspecified: Secondary | ICD-10-CM | POA: Insufficient documentation

## 2021-08-11 DIAGNOSIS — R52 Pain, unspecified: Secondary | ICD-10-CM | POA: Diagnosis not present

## 2021-08-11 DIAGNOSIS — I959 Hypotension, unspecified: Secondary | ICD-10-CM | POA: Diagnosis not present

## 2021-08-11 DIAGNOSIS — R079 Chest pain, unspecified: Secondary | ICD-10-CM | POA: Diagnosis not present

## 2021-08-11 LAB — CBC
HCT: 43.1 % (ref 36.0–46.0)
Hemoglobin: 13.6 g/dL (ref 12.0–15.0)
MCH: 30.9 pg (ref 26.0–34.0)
MCHC: 31.6 g/dL (ref 30.0–36.0)
MCV: 98 fL (ref 80.0–100.0)
Platelets: 167 10*3/uL (ref 150–400)
RBC: 4.4 MIL/uL (ref 3.87–5.11)
RDW: 13.5 % (ref 11.5–15.5)
WBC: 6.8 10*3/uL (ref 4.0–10.5)
nRBC: 0 % (ref 0.0–0.2)

## 2021-08-11 LAB — BASIC METABOLIC PANEL
Anion gap: 7 (ref 5–15)
BUN: 32 mg/dL — ABNORMAL HIGH (ref 8–23)
CO2: 29 mmol/L (ref 22–32)
Calcium: 9 mg/dL (ref 8.9–10.3)
Chloride: 104 mmol/L (ref 98–111)
Creatinine, Ser: 0.99 mg/dL (ref 0.44–1.00)
GFR, Estimated: 60 mL/min — ABNORMAL LOW (ref >60)
Glucose, Bld: 99 mg/dL (ref 70–99)
Potassium: 3.8 mmol/L (ref 3.5–5.1)
Sodium: 140 mmol/L (ref 135–145)

## 2021-08-11 LAB — TROPONIN I (HIGH SENSITIVITY)
Troponin I (High Sensitivity): 4 ng/L (ref <18)
Troponin I (High Sensitivity): 5 ng/L (ref <18)

## 2021-08-11 NOTE — ED Triage Notes (Signed)
Pt in via EMS from home d/t CP; sharp 2/10 currently at medial chest; pt reports at times she has had jaw pain on both sides; denies should blade pain or pain/numbness down arms; denies SOB or diaphoresis; pt reports 92% RA at home while episodes occur; pt's resp reg/unlabored currently; skin dry; pt calm; reports when pain first occurred at 0200 and 0400 this morning and then again in afternoon after the gym that it was sharp all across chest at a 10/10. Pt took 325 ASA beofre EMS arrival. EMS placed IV at L fa. VSS with EMS.

## 2021-08-11 NOTE — ED Notes (Signed)
Pt's husband at bedside.

## 2021-08-11 NOTE — ED Notes (Signed)
Pt assisted to restroom using wheelchair; pt states uses walker at home for balance.

## 2021-08-11 NOTE — ED Provider Notes (Signed)
[facility] Provider Note    Event Date/Time   First MD Initiated Contact with Patient 08/11/21 1630     (approximate)   History   Chest Pain   HPI: Hannah Powell is a 76 y.o. female with a history of CHF, COPD, hypertension, hypothyroidism, and chronic kidney disease who presents with several episodes of chest pain since early this morning.  She had initial episode that woke her up around 4 AM, and then additional episodes around 1 PM and 3 PM today.  She describes the pain as sharp and substernal.  The first episode completely resolved after she took aspirin and an antacid, and after the subsequent episodes the patient still has very mild continued pain.  She denies associated shortness of breath, lightheadedness, or vomiting, but did have some nausea with the initial episode.  The most recent episode occurred after she ate.  She states that she has had a couple of similar episodes in the past although the pain was more dull at that time.  She reports chronic leg swelling but no acute change.  She denies cough or fever.    Physical Exam   Triage Vital Signs: ED Triage Vitals  Enc Vitals Group     BP 08/11/21 1630 (!) 154/90     Pulse Rate 08/11/21 1630 69     Resp 08/11/21 1630 17     Temp 08/11/21 1631 97.7 F (36.5 C)     Temp Source 08/11/21 1631 Oral     SpO2 08/11/21 1630 98 %     Weight 08/11/21 1632 216 lb (98 kg)     Height 08/11/21 1632 '5\' 6"'$  (1.676 m)     Head Circumference --      Peak Flow --      Pain Score 08/11/21 1632 2     Pain Loc --      Pain Edu? --      Excl. in [place]? --     Most recent vital signs: Vitals:   08/11/21 1930 08/11/21 1935  BP: 134/73   Pulse: 74   Resp:    Temp:    SpO2:  95%     General: And and oriented, well-appearing. CV:  Good peripheral perfusion.  Normal heart sounds. Resp:  Normal effort.  Lungs CTAB. Abd:  No distention.  Other:  2+ bilateral lower extremity edema, chronic  appearing.   ED Results / Procedures / Treatments   Labs (all labs ordered are listed, but only abnormal results are displayed) Labs Reviewed  BASIC METABOLIC PANEL - Abnormal; Notable for the following components:      Result Value   BUN 32 (*)    GFR, Estimated 60 (*)    All other components within normal limits  CBC  TROPONIN I (HIGH SENSITIVITY)  TROPONIN I (HIGH SENSITIVITY)     EKG  ED ECG REPORT I, Arta Silence, the attending physician, personally viewed and interpreted this ECG.  Date: 08/11/2021 EKG Time: 1626 Rate: 70 Rhythm: normal sinus rhythm (incorrectly read by machine as atrial fibrillation) QRS Axis: normal Intervals: Nonspecific IVCD ST/T Wave abnormalities: normal Narrative Interpretation: no evidence of acute ischemia; no significant change when compared to EKG of 04/04/2018    RADIOLOGY  Chest x-ray: I independently viewed and interpreted the images; there is no focal consolidation or edema  PROCEDURES:  Critical Care performed: No  Procedures   MEDICATIONS ORDERED IN ED: Medications - No data to display   IMPRESSION /  MDM / ASSESSMENT AND PLAN / ED COURSE  I reviewed the triage vital signs and the nursing notes.  76 year old female with PMH as noted above but no prior history of CAD presents with somewhat atypical episodes of chest pain since early this morning, now mostly resolved.  I reviewed the past medical records.  The patient has no recent ED visits or admissions.  She follows with Dr. Caryl Comes and I confirmed from his note on 07/31/2021 that she is followed for COPD, chronic respiratory failure, CKD, degenerative disc disease, hypertension, and heart failure.  On exam currently, the patient is well-appearing.  Her vital signs are normal except for hypertension.  The physical exam is otherwise unremarkable.  EKG is nonischemic.  Differential diagnosis includes, but is not limited to, GERD, gastritis, musculoskeletal chest wall  pain, muscle strain/spasm, or less likely ACS.  I do not suspect PE or other vascular etiology given the patient's reassuring vital signs and well appearance and the fact that the pain has now mostly resolved.  We will obtain basic labs, troponins x2, and reassess.  Patient's presentation is most consistent with acute presentation with potential threat to life or bodily function.  The patient is on the cardiac monitor to evaluate for evidence of arrhythmia and/or significant heart rate changes.  ----------------------------------------- 7:43 PM on 08/11/2021 -----------------------------------------  Troponin negative x2.  CBC shows no leukocytosis or other abnormal findings.  Electrolytes and creatinine are normal.  The patient has not had any recurrence of chest pain.  She feels well and wants to go home.  I did consider whether the patient may warrant admission given presentation for chest pain and need for ACS rule out, however given the lack of recurrent symptoms, the lack of CAD history, and the negative enzymes, she is stable for discharge home as the work-up is consistent with noncardiac etiology of the chest pain.  I counseled the patient on the results of the work-up.  I have given cardiology referral.  I recommend that she take Pepcid over-the-counter for the next several days for likely GERD.  Return precautions given, and she expresses understanding.   FINAL CLINICAL IMPRESSION(S) / ED DIAGNOSES   Final diagnoses:  Atypical chest pain     Rx / DC Orders   ED Discharge Orders          Ordered    Ambulatory referral to Cardiology       Comments: If you have not heard from the Cardiology office within the next 72 hours please call [phone].   08/11/21 1942             Note:  This document was prepared using Dragon voice recognition software and may include unintentional dictation errors.    Arta Silence, MD 08/11/21 1945

## 2021-08-11 NOTE — Discharge Instructions (Signed)
Follow-up with your primary care doctor.  We have also given you a referral to cardiology.  You should be contacted by the cardiology office for follow-up.  Return to the ER for new, worsening, or persistent severe chest pain, difficulty breathing, weakness or lightheadedness, vomiting, or any other new or worsening symptoms that concern you.  You may take Pepcid (famotidine) which is available over-the-counter, 20 mg twice daily for the next several days.

## 2021-08-11 NOTE — ED Notes (Signed)
Pt reports her CP is still at a 2/10; medial; denies jaw/shoulder blade/back/arm involvement currently; resp reg/unlabored; husband remains at bedside; pt give tv remote.

## 2021-08-11 NOTE — ED Notes (Addendum)
D-dimer blue tube sent with rest of blood-work to lab. Pitting edema 1+ at both ankles.

## 2021-08-11 NOTE — ED Notes (Signed)
EDP Siadecki at bedside.

## 2021-08-11 NOTE — ED Notes (Signed)
Pt denies feeling SOB or increased WOB; resp reg/unlabored currently.

## 2021-08-11 NOTE — ED Notes (Signed)
Pt at xray

## 2021-08-21 DIAGNOSIS — G4733 Obstructive sleep apnea (adult) (pediatric): Secondary | ICD-10-CM | POA: Diagnosis not present

## 2021-08-21 DIAGNOSIS — R0789 Other chest pain: Secondary | ICD-10-CM | POA: Diagnosis not present

## 2021-08-21 DIAGNOSIS — E7800 Pure hypercholesterolemia, unspecified: Secondary | ICD-10-CM | POA: Diagnosis not present

## 2021-08-21 DIAGNOSIS — I10 Essential (primary) hypertension: Secondary | ICD-10-CM | POA: Diagnosis not present

## 2021-08-22 DIAGNOSIS — M5136 Other intervertebral disc degeneration, lumbar region: Secondary | ICD-10-CM | POA: Diagnosis not present

## 2021-08-22 DIAGNOSIS — N183 Chronic kidney disease, stage 3 unspecified: Secondary | ICD-10-CM | POA: Diagnosis not present

## 2021-08-22 DIAGNOSIS — I130 Hypertensive heart and chronic kidney disease with heart failure and stage 1 through stage 4 chronic kidney disease, or unspecified chronic kidney disease: Secondary | ICD-10-CM | POA: Diagnosis not present

## 2021-08-22 DIAGNOSIS — J449 Chronic obstructive pulmonary disease, unspecified: Secondary | ICD-10-CM | POA: Diagnosis not present

## 2021-08-22 DIAGNOSIS — I509 Heart failure, unspecified: Secondary | ICD-10-CM | POA: Diagnosis not present

## 2021-08-22 DIAGNOSIS — J9611 Chronic respiratory failure with hypoxia: Secondary | ICD-10-CM | POA: Diagnosis not present

## 2021-08-22 DIAGNOSIS — Z87891 Personal history of nicotine dependence: Secondary | ICD-10-CM | POA: Diagnosis not present

## 2021-08-22 DIAGNOSIS — G63 Polyneuropathy in diseases classified elsewhere: Secondary | ICD-10-CM | POA: Diagnosis not present

## 2021-08-22 DIAGNOSIS — R079 Chest pain, unspecified: Secondary | ICD-10-CM | POA: Diagnosis not present

## 2021-08-31 DIAGNOSIS — J449 Chronic obstructive pulmonary disease, unspecified: Secondary | ICD-10-CM | POA: Diagnosis not present

## 2021-09-04 ENCOUNTER — Encounter: Payer: Medicare HMO | Admitting: Physical Therapy

## 2021-09-13 DIAGNOSIS — R0789 Other chest pain: Secondary | ICD-10-CM | POA: Diagnosis not present

## 2021-09-22 DIAGNOSIS — E7800 Pure hypercholesterolemia, unspecified: Secondary | ICD-10-CM | POA: Diagnosis not present

## 2021-09-22 DIAGNOSIS — Z79899 Other long term (current) drug therapy: Secondary | ICD-10-CM | POA: Diagnosis not present

## 2021-09-22 DIAGNOSIS — N1831 Chronic kidney disease, stage 3a: Secondary | ICD-10-CM | POA: Diagnosis not present

## 2021-09-22 DIAGNOSIS — E034 Atrophy of thyroid (acquired): Secondary | ICD-10-CM | POA: Diagnosis not present

## 2021-09-25 DIAGNOSIS — I5032 Chronic diastolic (congestive) heart failure: Secondary | ICD-10-CM | POA: Diagnosis not present

## 2021-09-25 DIAGNOSIS — I10 Essential (primary) hypertension: Secondary | ICD-10-CM | POA: Diagnosis not present

## 2021-09-25 DIAGNOSIS — G4733 Obstructive sleep apnea (adult) (pediatric): Secondary | ICD-10-CM | POA: Diagnosis not present

## 2021-10-02 DIAGNOSIS — Z1389 Encounter for screening for other disorder: Secondary | ICD-10-CM | POA: Diagnosis not present

## 2021-10-02 DIAGNOSIS — J449 Chronic obstructive pulmonary disease, unspecified: Secondary | ICD-10-CM | POA: Diagnosis not present

## 2021-10-02 DIAGNOSIS — M48062 Spinal stenosis, lumbar region with neurogenic claudication: Secondary | ICD-10-CM | POA: Diagnosis not present

## 2021-10-02 DIAGNOSIS — N183 Chronic kidney disease, stage 3 unspecified: Secondary | ICD-10-CM | POA: Diagnosis not present

## 2021-10-02 DIAGNOSIS — G63 Polyneuropathy in diseases classified elsewhere: Secondary | ICD-10-CM | POA: Diagnosis not present

## 2021-10-02 DIAGNOSIS — J9611 Chronic respiratory failure with hypoxia: Secondary | ICD-10-CM | POA: Diagnosis not present

## 2021-10-02 DIAGNOSIS — I509 Heart failure, unspecified: Secondary | ICD-10-CM | POA: Diagnosis not present

## 2021-10-02 DIAGNOSIS — E039 Hypothyroidism, unspecified: Secondary | ICD-10-CM | POA: Diagnosis not present

## 2021-10-02 DIAGNOSIS — Z0000 Encounter for general adult medical examination without abnormal findings: Secondary | ICD-10-CM | POA: Diagnosis not present

## 2021-10-02 DIAGNOSIS — I130 Hypertensive heart and chronic kidney disease with heart failure and stage 1 through stage 4 chronic kidney disease, or unspecified chronic kidney disease: Secondary | ICD-10-CM | POA: Diagnosis not present

## 2021-10-04 ENCOUNTER — Other Ambulatory Visit: Payer: Self-pay | Admitting: Internal Medicine

## 2021-10-04 DIAGNOSIS — Z1231 Encounter for screening mammogram for malignant neoplasm of breast: Secondary | ICD-10-CM

## 2021-10-17 DIAGNOSIS — M79675 Pain in left toe(s): Secondary | ICD-10-CM | POA: Diagnosis not present

## 2021-10-17 DIAGNOSIS — M2141 Flat foot [pes planus] (acquired), right foot: Secondary | ICD-10-CM | POA: Diagnosis not present

## 2021-10-17 DIAGNOSIS — B351 Tinea unguium: Secondary | ICD-10-CM | POA: Diagnosis not present

## 2021-10-17 DIAGNOSIS — M216X1 Other acquired deformities of right foot: Secondary | ICD-10-CM | POA: Diagnosis not present

## 2021-10-17 DIAGNOSIS — M79674 Pain in right toe(s): Secondary | ICD-10-CM | POA: Diagnosis not present

## 2021-10-17 DIAGNOSIS — G629 Polyneuropathy, unspecified: Secondary | ICD-10-CM | POA: Diagnosis not present

## 2021-10-17 DIAGNOSIS — L851 Acquired keratosis [keratoderma] palmaris et plantaris: Secondary | ICD-10-CM | POA: Diagnosis not present

## 2021-10-17 DIAGNOSIS — M2041 Other hammer toe(s) (acquired), right foot: Secondary | ICD-10-CM | POA: Diagnosis not present

## 2021-10-17 DIAGNOSIS — M19072 Primary osteoarthritis, left ankle and foot: Secondary | ICD-10-CM | POA: Diagnosis not present

## 2021-10-17 DIAGNOSIS — I890 Lymphedema, not elsewhere classified: Secondary | ICD-10-CM | POA: Diagnosis not present

## 2021-10-18 ENCOUNTER — Ambulatory Visit: Payer: Medicare HMO | Admitting: Physical Therapy

## 2021-10-23 ENCOUNTER — Ambulatory Visit: Payer: Medicare HMO | Admitting: Physical Therapy

## 2021-10-24 DIAGNOSIS — I130 Hypertensive heart and chronic kidney disease with heart failure and stage 1 through stage 4 chronic kidney disease, or unspecified chronic kidney disease: Secondary | ICD-10-CM | POA: Diagnosis not present

## 2021-10-24 DIAGNOSIS — Z87891 Personal history of nicotine dependence: Secondary | ICD-10-CM | POA: Diagnosis not present

## 2021-10-24 DIAGNOSIS — I5032 Chronic diastolic (congestive) heart failure: Secondary | ICD-10-CM | POA: Diagnosis not present

## 2021-10-24 DIAGNOSIS — N183 Chronic kidney disease, stage 3 unspecified: Secondary | ICD-10-CM | POA: Diagnosis not present

## 2021-10-24 DIAGNOSIS — J449 Chronic obstructive pulmonary disease, unspecified: Secondary | ICD-10-CM | POA: Diagnosis not present

## 2021-10-24 DIAGNOSIS — J9611 Chronic respiratory failure with hypoxia: Secondary | ICD-10-CM | POA: Diagnosis not present

## 2021-10-25 ENCOUNTER — Ambulatory Visit: Payer: Medicare HMO | Admitting: Physical Therapy

## 2021-10-25 ENCOUNTER — Ambulatory Visit: Payer: Medicare HMO

## 2021-11-10 ENCOUNTER — Ambulatory Visit: Payer: Medicare HMO | Attending: Internal Medicine | Admitting: Physical Therapy

## 2021-11-10 DIAGNOSIS — R293 Abnormal posture: Secondary | ICD-10-CM | POA: Insufficient documentation

## 2021-11-10 DIAGNOSIS — R269 Unspecified abnormalities of gait and mobility: Secondary | ICD-10-CM | POA: Insufficient documentation

## 2021-11-10 DIAGNOSIS — R2689 Other abnormalities of gait and mobility: Secondary | ICD-10-CM | POA: Insufficient documentation

## 2021-11-10 DIAGNOSIS — M6281 Muscle weakness (generalized): Secondary | ICD-10-CM | POA: Insufficient documentation

## 2021-11-10 DIAGNOSIS — M5459 Other low back pain: Secondary | ICD-10-CM | POA: Diagnosis not present

## 2021-11-11 ENCOUNTER — Encounter: Payer: Self-pay | Admitting: Physical Therapy

## 2021-11-11 NOTE — Therapy (Signed)
OUTPATIENT PHYSICAL THERAPY THORACOLUMBAR EVALUATION   Patient Name: Hannah Powell MRN: [id number] DOB:1945-06-15, 76 y.o., female Today's Date: 11/11/2021   PT End of Session - 11/11/21 1636     Visit Number 1    Number of Visits 17    Date for PT Re-Evaluation 01/05/22    PT Start Time 0730    PT Stop Time 0822    PT Time Calculation (min) 52 min    Activity Tolerance Patient tolerated treatment well    Behavior During Therapy [facility] for tasks assessed/performed             Past Medical History:  Diagnosis Date   Anxiety    Arthritis    CHF (congestive heart failure) (HCC)    COPD (chronic obstructive pulmonary disease) ([place])    Dysplastic nevus 04/22/2019   R abdomen parallel to sup umbilicus - mod   Dysplastic nevus 04/22/2019   R low back above waistline - mild    Edema    feet/legs   Hypertension    dr Otho Najjar      Hypothyroidism    Leg weakness, bilateral    Osteoporosis    Oxygen deficit    2l with bipap at night   RLS (restless legs syndrome)    Shortness of breath    Sleep apnea    bipap   Wheezing    Past Surgical History:  Procedure Laterality Date   BACK SURGERY     cervical   BREAST BIOPSY Left    needle bx-neg   CATARACT EXTRACTION W/PHACO Left 05/07/2017   Procedure: CATARACT EXTRACTION PHACO AND INTRAOCULAR LENS PLACEMENT ([place]);  Surgeon: Birder Robson, MD;  Location: [facility];  Service: Ophthalmology;  Laterality: Left;  [place] 00:48.7 AP% 11.3 CDE 5.46 Fluid Pack Lot # [id number]   CATARACT EXTRACTION W/PHACO Right 04/23/2017   Procedure: CATARACT EXTRACTION PHACO AND INTRAOCULAR LENS PLACEMENT ([facility]);  Surgeon: Birder Robson, MD;  Location: [facility];  Service: Ophthalmology;  Laterality: Right;  [place] 00:29 AP% 12.8 CDE 3.79 FLUID PACK LOT # [id number]   FOOT ARTHROPLASTY     JOINT REPLACEMENT     x2 tkr   TOTAL SHOULDER ARTHROPLASTY Left 07/02/2013   Procedure: LEFT TOTAL SHOULDER ARTHROPLASTY;  Surgeon: Marin Shutter, MD;   Location: [place];  Service: Orthopedics;  Laterality: Left;   TOTAL SHOULDER ARTHROPLASTY Right 04/10/2018   Procedure: TOTAL REVERSE SHOULDER ARTHROPLASTY;  Surgeon: Justice Britain, MD;  Location: [facility];  Service: Orthopedics;  Laterality: Right;  13mn   Patient Active Problem List   Diagnosis Date Noted   S/P reverse total shoulder arthroplasty, 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   Chronic diastolic heart failure ([place] 08/09/2016   COPD (chronic obstructive pulmonary disease) ([place] 08/09/2016   Pulmonary infiltrates    Pneumonia due to Streptococcus pneumoniae ([place]    Fever    Community acquired pneumonia of right lung    Palliative care by specialist    Goals of care, counseling/discussion    Pressure injury of skin 07/03/2016   [place]D exacerbation ([place] 07/21/2015   Acute renal insufficiency 07/21/2015   Hyperglycemia 07/21/2015   Benign essential HTN 07/21/2015   S/P shoulder replacement 07/02/2013    PCP: KAdin Hector MD  REFERRING PROVIDER: KAdin Hector MD  REFERRING DIAG: Low back pain  Rationale for Evaluation and Treatment Rehabilitation  THERAPY DIAG:  Other low back pain  Muscle weakness (generalized)  Gait difficulty  Abnormal posture  Imbalance  ONSET DATE: 03/20/2019  SUBJECTIVE:                                                                                                                                                                                           SUBJECTIVE STATEMENT: Pt. Reports chronic h/o lumbar stenosis/ back pain.  Pt. Reports 8/10 low back pain in morning and uses heating pad/ ibuprofen to decrease back pain to 1/10 after about 2 hours.  Pt. Has returned to [employer] and aquatic ex. Recently.    PERTINENT HISTORY:  See MD notes.    PAIN:  Are you having pain? Yes: NPRS scale: 1-2/10 Pain location: low back Pain description: aching/ stiff Aggravating factors: prolonged activity Relieving factors: heat/  ibuprofen   PRECAUTIONS: Fall  WEIGHT BEARING RESTRICTIONS No  FALLS:  Has patient fallen in last 6 months? No  LIVING ENVIRONMENT: Lives with: lives with their spouse Lives in: House/apartment  OCCUPATION: Retired  PLOF: Independent with household mobility with device  PATIENT GOALS Increase ability to stand from chair/ improve upright posture/ decrease low back pain.     OBJECTIVE:   PATIENT SURVEYS:  FOTO initial 57/ goal 35  SCREENING FOR RED FLAGS: Bowel or bladder incontinence: No Spinal tumors: No Cauda equina syndrome: No Compression fracture: No Abdominal aneurysm: No  COGNITION:  Overall cognitive status: Within functional limits for tasks assessed     SENSATION: Not tested  POSTURE: rounded shoulders, forward head, and increased thoracic kyphosis  PALPATION: Tenderness over lumbar paraspinals.   LUMBAR ROM:   Active  A/PROM  eval  Flexion 50% limited  Extension 75%  Right lateral flexion 50%  Left lateral flexion 50%  Right rotation 50%  Left rotation 50%   (Blank rows = not tested)  LOWER EXTREMITY ROM:     B hip flexion/ knee extension limited.    LOWER EXTREMITY MMT:    B LE muscle strength grossly 4/5 MMT except hamstring 4+/5 MMT  5xSTS: 58sec. From 23" blue mat table (challenged).    Ascends steps with L LE and heavy UE assist.   GAIT: Distance walked: In clinic Assistive device utilized: Walker - 2 wheeled  Elevated forearm support Level of assistance: Modified independence Comments: Significant forward head/ flexed posture/ thoracic kyphosis   TODAY'S TREATMENT  Evaluation.  Nustep L4 10 min. B UE/LE.  Discussed gym based ex./ pool ex.     PATIENT EDUCATION:  Education details: HEP/ upright posture/ Sit stands Person educated: Patient Education method: Customer service manager Education comprehension: verbalized understanding and returned demonstration   HOME EXERCISE PROGRAM: Next tx. Session.     ASSESSMENT:  CLINICAL IMPRESSION: Patient is a  pleasant 76 y.o. female who was seen today for physical therapy evaluation and treatment for low back pain/ gait difficulty/ muscle weakness.  Pt. Presents with moderate hip/ core muscle weakness and difficulty with sit to stands without UE assist.  Pt. Ambulates with forward flexed posture/ heavy forearm support.  No recent falls and c/o moderate low back pain in morning and will daily tasks.  Pt. Will benefit from skilled PT services in combination with gym based ex. To improve LE strengthening/ pain-free mobility.     OBJECTIVE IMPAIRMENTS Abnormal gait, cardiopulmonary status limiting activity, decreased activity tolerance, decreased balance, decreased coordination, decreased endurance, decreased mobility, difficulty walking, decreased ROM, decreased strength, impaired flexibility, improper body mechanics, postural dysfunction, and pain.   ACTIVITY LIMITATIONS carrying, lifting, bending, standing, squatting, transfers, bed mobility, bathing, toileting, dressing, and locomotion level  PARTICIPATION LIMITATIONS: meal prep, cleaning, laundry, driving, shopping, and community activity  PERSONAL FACTORS Age, Fitness, Past/current experiences, and 3+ comorbidities:      REHAB POTENTIAL: Good  CLINICAL DECISION MAKING: Evolving/moderate complexity  EVALUATION COMPLEXITY: Moderate   GOALS: Goals reviewed with patient? Yes  SHORT TERM GOALS: Target date: 12/08/21  Pt. Will be independent with HEP to increase LE muscle strength 1/2 muscle grade to improve transfers/ safety with gait.  Baseline:  see above Goal status: INITIAL   LONG TERM GOALS: Target date: 01/05/22  Pt. Will increase FOTO to 63 to improve pain-free mobility.   Baseline: initial FOTO 57 Goal status: INITIAL  2.  Pt. Able to stand from 21# chair height with no UE assist to improve transfers/ independence with daily activity.   Baseline:  Goal status: INITIAL  3.  Pt.  Will decrease 5xSTS to <30 sec. To improve functional mobility/ decrease fall risk.  Baseline: 58sec. From 23" blue mat table (challenged). Goal status: INITIAL   PLAN: PT FREQUENCY: 1-2x/week  PT DURATION: 8 weeks  PLANNED INTERVENTIONS: Therapeutic exercises, Therapeutic activity, Neuromuscular re-education, Balance training, Gait training, Patient/Family education, Self Care, Joint mobilization, Stair training, Aquatic Therapy, Moist heat, Manual therapy, and Re-evaluation.  PLAN FOR NEXT SESSION: Progress HEP  Pura Spice, PT, DPT # [phone] 11/11/2021, 4:40 PM

## 2021-11-17 ENCOUNTER — Encounter: Payer: Self-pay | Admitting: Physical Therapy

## 2021-11-17 ENCOUNTER — Ambulatory Visit: Payer: Medicare HMO | Attending: Internal Medicine | Admitting: Physical Therapy

## 2021-11-17 DIAGNOSIS — M6281 Muscle weakness (generalized): Secondary | ICD-10-CM | POA: Insufficient documentation

## 2021-11-17 DIAGNOSIS — R269 Unspecified abnormalities of gait and mobility: Secondary | ICD-10-CM | POA: Diagnosis not present

## 2021-11-17 DIAGNOSIS — R2689 Other abnormalities of gait and mobility: Secondary | ICD-10-CM | POA: Insufficient documentation

## 2021-11-17 DIAGNOSIS — M5459 Other low back pain: Secondary | ICD-10-CM | POA: Diagnosis not present

## 2021-11-17 DIAGNOSIS — R293 Abnormal posture: Secondary | ICD-10-CM | POA: Diagnosis not present

## 2021-11-17 NOTE — Therapy (Signed)
OUTPATIENT PHYSICAL THERAPY THORACOLUMBAR EVALUATION   Patient Name: Hannah Powell MRN: [id number] DOB:1945/05/27, 76 y.o., female Today's Date: 11/17/2021   PT End of Session - 11/17/21 1525     Visit Number 2    Number of Visits 17    Date for PT Re-Evaluation 01/05/22    PT Start Time 1430    PT Stop Time 1518    PT Time Calculation (min) 48 min    Activity Tolerance Patient tolerated treatment well    Behavior During Therapy [facility] for tasks assessed/performed             Past Medical History:  Diagnosis Date   Anxiety    Arthritis    CHF (congestive heart failure) (HCC)    COPD (chronic obstructive pulmonary disease) ([place])    Dysplastic nevus 04/22/2019   R abdomen parallel to sup umbilicus - mod   Dysplastic nevus 04/22/2019   R low back above waistline - mild    Edema    feet/legs   Hypertension    dr Otho Najjar      Hypothyroidism    Leg weakness, bilateral    Osteoporosis    Oxygen deficit    2l with bipap at night   RLS (restless legs syndrome)    Shortness of breath    Sleep apnea    bipap   Wheezing    Past Surgical History:  Procedure Laterality Date   BACK SURGERY     cervical   BREAST BIOPSY Left    needle bx-neg   CATARACT EXTRACTION W/PHACO Left 05/07/2017   Procedure: CATARACT EXTRACTION PHACO AND INTRAOCULAR LENS PLACEMENT ([place]);  Surgeon: Birder Robson, MD;  Location: [facility];  Service: Ophthalmology;  Laterality: Left;  [place] 00:48.7 AP% 11.3 CDE 5.46 Fluid Pack Lot # [id number]   CATARACT EXTRACTION W/PHACO Right 04/23/2017   Procedure: CATARACT EXTRACTION PHACO AND INTRAOCULAR LENS PLACEMENT ([facility]);  Surgeon: Birder Robson, MD;  Location: [facility];  Service: Ophthalmology;  Laterality: Right;  [place] 00:29 AP% 12.8 CDE 3.79 FLUID PACK LOT # [id number]   FOOT ARTHROPLASTY     JOINT REPLACEMENT     x2 tkr   TOTAL SHOULDER ARTHROPLASTY Left 07/02/2013   Procedure: LEFT TOTAL SHOULDER ARTHROPLASTY;  Surgeon: Marin Shutter, MD;   Location: [place];  Service: Orthopedics;  Laterality: Left;   TOTAL SHOULDER ARTHROPLASTY Right 04/10/2018   Procedure: TOTAL REVERSE SHOULDER ARTHROPLASTY;  Surgeon: Justice Britain, MD;  [facility];  Service: Orthopedics;  Laterality: Right;  167mn   Patient Active Problem List   Diagnosis Date Noted   S/P reverse total shoulder arthroplasty, 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   Chronic diastolic heart [place] ([place] 08/09/2016   COPD [place] obstructive [place] disease) [place] 08/09/2016   Pulmonary infiltrates    Pneumonia due to Streptococcus pneumoniae ([place]    Fever    Community acquired pneumonia of right lung    Palliative care by specialist    Goals of care, counseling/discussion    Pressure injury of skin 07/03/2016   COPD exacerbation ([place] 07/21/2015   Acute renal insufficiency 07/21/2015   Hyperglycemia 07/21/2015   Benign essential HTN 07/21/2015   S/P shoulder replacement 07/02/2013    PCP: KAdin Hector MD  REFERRING PROVIDER: KAdin Hector MD  REFERRING DIAG: Low back pain  Rationale for Evaluation and Treatment Rehabilitation  THERAPY DIAG:  Other low back pain  Muscle weakness (generalized)  Gait difficulty  Abnormal posture  Imbalance  ONSET DATE: 03/20/2019  SUBJECTIVE:                                                                                                                                                                                           SUBJECTIVE STATEMENT:  EVALUATION Pt. Reports chronic h/o lumbar stenosis/ back pain.  Pt. Reports 8/10 low back pain in morning and uses heating pad/ ibuprofen to decrease back pain to 1/10 after about 2 hours.  Pt. Has returned to [employer] and aquatic ex. Recently.    PERTINENT HISTORY:  See MD notes.    PAIN:  Are you having pain? Yes: NPRS scale: 1-2/10 Pain location: low back Pain description: aching/ stiff Aggravating factors: prolonged activity Relieving factors: heat/  ibuprofen   PRECAUTIONS: Fall  WEIGHT BEARING RESTRICTIONS No  FALLS:  Has patient fallen in last 6 months? No  LIVING ENVIRONMENT: Lives with: lives with their spouse Lives in: House/apartment  OCCUPATION: Retired  PLOF: Independent with household mobility with device  PATIENT GOALS Increase ability to stand from chair/ improve upright posture/ decrease low back pain.     OBJECTIVE:   PATIENT SURVEYS:  FOTO initial 57/ goal 35  SCREENING FOR RED FLAGS: Bowel or bladder incontinence: No Spinal tumors: No Cauda equina syndrome: No Compression fracture: No Abdominal aneurysm: No  COGNITION:  Overall cognitive status: Within functional limits for tasks assessed     SENSATION: Not tested  POSTURE: rounded shoulders, forward head, and increased thoracic kyphosis  PALPATION: Tenderness over lumbar paraspinals.   LUMBAR ROM:   Active  A/PROM  eval  Flexion 50% limited  Extension 75%  Right lateral flexion 50%  Left lateral flexion 50%  Right rotation 50%  Left rotation 50%   (Blank rows = not tested)  LOWER EXTREMITY ROM:     B hip flexion/ knee extension limited.    LOWER EXTREMITY MMT:    B LE muscle strength grossly 4/5 MMT except hamstring 4+/5 MMT  5xSTS: 58sec. From 23" blue mat table (challenged).    Ascends steps with L LE and heavy UE assist.   GAIT: Distance walked: In clinic Assistive device utilized: Walker - 2 wheeled  Elevated forearm support Level of assistance: Modified independence Comments: Significant forward head/ flexed posture/ thoracic kyphosis   TODAY'S TREATMENT    11/17/21:   Subjective:  Pt. Reports no new complaints.  Pt. Went to gym this week to ride the bike.  No aquatic ex. And no recent LOB/falls over past week.  No subjective pain score given for LBP.     There.ex.:   Seated 4# ankle wts.: marching/ LAQ/ heel and toe raises 20x.  Walking in //-bars forward 2 laps.  6" step touches in //-bars with B UE assist.      Walking in //-bars forward/ lateral 3 laps with UE assist/ no ankle wts.  Mirror feedback for posture correction.    Seated Nautilus: 40# lat. Pull downs with static shoulder flexion stretches 20x/ standing tricep extension 30# 20x (difficulty with balance without UE assist).     Walking in //-bars with 6" hurdle clearance/ 6" step ups 2 laps.  Fatigue noted.   Nustep L4 10 min. B UE/LE.  Discussed upcoming weekend.     PATIENT EDUCATION:  Education details: HEP/ upright posture/ Sit stands Person educated: Patient Education method: Customer service manager Education comprehension: verbalized understanding and returned demonstration   HOME EXERCISE PROGRAM: Next tx. Session.    ASSESSMENT:  CLINICAL IMPRESSION: Patient works hard during PT tx. Session with focus on posture correction/ hip flexion and LE strengthening.  No significant c/o low back pain but moderate forward flexed posture/ head position during seated and standing there.ex.  Pt. Requires heavy UE assist in //-bars and with RW for all mobility tasks.  Pt. Will benefit from skilled PT services in combination with gym based ex. To improve LE strengthening/ pain-free mobility.     OBJECTIVE IMPAIRMENTS Abnormal gait, cardiopulmonary status limiting activity, decreased activity tolerance, decreased balance, decreased coordination, decreased endurance, decreased mobility, difficulty walking, decreased ROM, decreased strength, impaired flexibility, improper body mechanics, postural dysfunction, and pain.   ACTIVITY LIMITATIONS carrying, lifting, bending, standing, squatting, transfers, bed mobility, bathing, toileting, dressing, and locomotion level  PARTICIPATION LIMITATIONS: meal prep, cleaning, laundry, driving, shopping, and community activity  PERSONAL FACTORS Age, Fitness, Past/current experiences, and 3+ comorbidities:      REHAB POTENTIAL: Good  CLINICAL DECISION MAKING: Evolving/moderate  complexity  EVALUATION COMPLEXITY: Moderate   GOALS: Goals reviewed with patient? Yes  SHORT TERM GOALS: Target date: 12/08/21  Pt. Will be independent with HEP to increase LE muscle strength 1/2 muscle grade to improve transfers/ safety with gait.  Baseline:  see above Goal status: INITIAL   LONG TERM GOALS: Target date: 01/05/22  Pt. Will increase FOTO to 63 to improve pain-free mobility.   Baseline: initial FOTO 57 Goal status: INITIAL  2.  Pt. Able to stand from 21# chair height with no UE assist to improve transfers/ independence with daily activity.   Baseline:  Goal status: INITIAL  3.  Pt. Will decrease 5xSTS to <30 sec. To improve functional mobility/ decrease fall risk.  Baseline: 58sec. From 23" blue mat table (challenged). Goal status: INITIAL   PLAN: PT FREQUENCY: 1-2x/week  PT DURATION: 8 weeks  PLANNED INTERVENTIONS: Therapeutic exercises, Therapeutic activity, Neuromuscular re-education, Balance training, Gait training, Patient/Family education, Self Care, Joint mobilization, Stair training, Aquatic Therapy, Moist heat, Manual therapy, and Re-evaluation.  PLAN FOR NEXT SESSION: Progress HEP/ pain mgmt. For low back  Pura Spice, PT, DPT # [phone] 11/17/2021, 3:36 PM

## 2021-11-23 ENCOUNTER — Encounter: Payer: Medicare HMO | Admitting: Physical Therapy

## 2021-11-24 ENCOUNTER — Encounter: Payer: Medicare HMO | Admitting: Physical Therapy

## 2021-11-27 ENCOUNTER — Ambulatory Visit: Payer: Medicare HMO | Admitting: Physical Therapy

## 2021-11-27 DIAGNOSIS — J9611 Chronic respiratory failure with hypoxia: Secondary | ICD-10-CM | POA: Diagnosis not present

## 2021-11-27 DIAGNOSIS — J449 Chronic obstructive pulmonary disease, unspecified: Secondary | ICD-10-CM | POA: Diagnosis not present

## 2021-11-27 DIAGNOSIS — M4854XA Collapsed vertebra, not elsewhere classified, thoracic region, initial encounter for fracture: Secondary | ICD-10-CM | POA: Diagnosis not present

## 2021-11-27 DIAGNOSIS — N183 Chronic kidney disease, stage 3 unspecified: Secondary | ICD-10-CM | POA: Diagnosis not present

## 2021-11-27 DIAGNOSIS — J069 Acute upper respiratory infection, unspecified: Secondary | ICD-10-CM | POA: Diagnosis not present

## 2021-11-27 DIAGNOSIS — I517 Cardiomegaly: Secondary | ICD-10-CM | POA: Diagnosis not present

## 2021-11-27 DIAGNOSIS — I5032 Chronic diastolic (congestive) heart failure: Secondary | ICD-10-CM | POA: Diagnosis not present

## 2021-11-28 ENCOUNTER — Ambulatory Visit: Payer: Medicare HMO

## 2021-11-29 ENCOUNTER — Encounter: Payer: Medicare HMO | Admitting: Physical Therapy

## 2021-12-04 ENCOUNTER — Ambulatory Visit: Payer: Medicare HMO | Admitting: Physical Therapy

## 2021-12-04 DIAGNOSIS — M5459 Other low back pain: Secondary | ICD-10-CM

## 2021-12-04 DIAGNOSIS — R269 Unspecified abnormalities of gait and mobility: Secondary | ICD-10-CM | POA: Diagnosis not present

## 2021-12-04 DIAGNOSIS — R293 Abnormal posture: Secondary | ICD-10-CM

## 2021-12-04 DIAGNOSIS — R2689 Other abnormalities of gait and mobility: Secondary | ICD-10-CM | POA: Diagnosis not present

## 2021-12-04 DIAGNOSIS — M6281 Muscle weakness (generalized): Secondary | ICD-10-CM | POA: Diagnosis not present

## 2021-12-04 NOTE — Therapy (Signed)
OUTPATIENT PHYSICAL THERAPY THORACOLUMBAR EVALUATION   Patient Name: Hannah Powell MRN: [id number] DOB:1945-08-28, 76 y.o., female Today's Date: 12/04/2021   PT End of Session - 12/04/21 1837     Visit Number 3    Number of Visits 17    Date for PT Re-Evaluation 01/05/22    PT Start Time 1343    PT Stop Time 1434    PT Time Calculation (min) 51 min    Activity Tolerance Patient tolerated treatment well    Behavior During Therapy [facility] for tasks assessed/performed             Past Medical History:  Diagnosis Date   Anxiety    Arthritis    CHF (congestive heart failure) (HCC)    COPD (chronic obstructive pulmonary disease) ([place])    Dysplastic nevus 04/22/2019   R abdomen parallel to sup umbilicus - mod   Dysplastic nevus 04/22/2019   R low back above waistline - mild    Edema    feet/legs   Hypertension    dr Otho Najjar      Hypothyroidism    Leg weakness, bilateral    Osteoporosis    Oxygen deficit    2l with bipap at night   RLS (restless legs syndrome)    Shortness of breath    Sleep apnea    bipap   Wheezing    Past Surgical History:  Procedure Laterality Date   BACK SURGERY     cervical   BREAST BIOPSY Left    needle bx-neg   CATARACT EXTRACTION W/PHACO Left 05/07/2017   Procedure: CATARACT EXTRACTION PHACO AND INTRAOCULAR LENS PLACEMENT ([place]);  Surgeon: Birder Robson, MD;  Location: [facility];  Service: Ophthalmology;  Laterality: Left;  [place] 00:48.7 AP% 11.3 CDE 5.46 Fluid Pack Lot # [id number]   CATARACT EXTRACTION W/PHACO Right 04/23/2017   Procedure: CATARACT EXTRACTION PHACO AND INTRAOCULAR LENS PLACEMENT (I[facility]);  Surgeon: Birder Robson, MD;  Location: [facility];  Service: Ophthalmology;  Laterality: Right;  [place] 00:29 AP% 12.8 CDE 3.79 FLUID PACK LOT # [id number]   FOOT ARTHROPLASTY     JOINT REPLACEMENT     x2 tkr   TOTAL SHOULDER ARTHROPLASTY Left 07/02/2013   Procedure: LEFT TOTAL SHOULDER ARTHROPLASTY;  Surgeon: Marin Shutter, MD;   Location: [place];  Service: Orthopedics;  Laterality: Left;   TOTAL SHOULDER ARTHROPLASTY Right 04/10/2018   Procedure: TOTAL REVERSE SHOULDER ARTHROPLASTY;  Surgeon: Justice Britain, MD;  Location[facility];  Service: Orthopedics;  Laterality: Right;  112mn   Patient Active Problem List   Diagnosis Date Noted   S/P reverse total shoulder arthroplasty, 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   Chronic diastolic heart failure ([place] 08/09/2016   COPD [place] obstructive [place]) [place] 08/09[place]2016   Pulmonary infiltrates    Pneumonia due to Streptococcus pneumoniae ([place]    Fever    Community acquired pneumonia of right lung    Palliative care by specialist    Goals of care, counseling/discussion    Pressure injury of skin 07/03/2016   [place]   Acute renal insufficiency 07/21/2015   Hyperglycemia 07/21/2015   Benign essential HTN 07/21/2015   S/P shoulder replacement 07/02/2013    PCP: KAdin Hector MD  REFERRING PROVIDER: KAdin Hector MD  REFERRING DIAG: Low back pain  Rationale for Evaluation and Treatment Rehabilitation  THERAPY DIAG:  Other low back pain  Muscle weakness (generalized)  Gait difficulty  Abnormal posture  Imbalance  ONSET DATE: 03/20/2019  SUBJECTIVE:                                                                                                                                                                                           SUBJECTIVE STATEMENT:  EVALUATION Pt. Reports chronic h/o lumbar stenosis/ back pain.  Pt. Reports 8/10 low back pain in morning and uses heating pad/ ibuprofen to decrease back pain to 1/10 after about 2 hours.  Pt. Has returned to [employer] and aquatic ex. Recently.    PERTINENT HISTORY:  See MD notes.    PAIN:  Are you having pain? Yes: NPRS scale: 1-2/10 Pain location: low back Pain description: aching/ stiff Aggravating factors: prolonged activity Relieving factors: heat/  ibuprofen   PRECAUTIONS: Fall  WEIGHT BEARING RESTRICTIONS No  FALLS:  Has patient fallen in last 6 months? No  LIVING ENVIRONMENT: Lives with: lives with their spouse Lives in: House/apartment  OCCUPATION: Retired  PLOF: Independent with household mobility with device  PATIENT GOALS Increase ability to stand from chair/ improve upright posture/ decrease low back pain.     OBJECTIVE:   PATIENT SURVEYS:  FOTO initial 57/ goal 90  SCREENING FOR RED FLAGS: Bowel or bladder incontinence: No Spinal tumors: No Cauda equina syndrome: No Compression fracture: No Abdominal aneurysm: No  COGNITION:  Overall cognitive status: Within functional limits for tasks assessed     SENSATION: Not tested  POSTURE: rounded shoulders, forward head, and increased thoracic kyphosis  PALPATION: Tenderness over lumbar paraspinals.   LUMBAR ROM:   Active  A/PROM  eval  Flexion 50% limited  Extension 75%  Right lateral flexion 50%  Left lateral flexion 50%  Right rotation 50%  Left rotation 50%   (Blank rows = not tested)  LOWER EXTREMITY ROM:     B hip flexion/ knee extension limited.    LOWER EXTREMITY MMT:    B LE muscle strength grossly 4/5 MMT except hamstring 4+/5 MMT  5xSTS: 58sec. From 23" blue mat table (challenged).    Ascends steps with L LE and heavy UE assist.   GAIT: Distance walked: In clinic Assistive device utilized: Walker - 2 wheeled  Elevated forearm support Level of assistance: Modified independence Comments: Significant forward head/ flexed posture/ thoracic kyphosis   TODAY'S TREATMENT    12/04/21:   Subjective:  Pt. Has been sick with COPD exacerbation over past 2 weeks.  Pt. Reports feeling better today and ready to work with PT/ return to gym.  No c/o low back pain at this time.      There.ex:    Seated shoulder flexion 10x/ Seated and standing posture correction at //-  bars.     Walking in //-bars with increase hip flexion/ step pattern  (cuing to correct BOS)- forward/ backwards 4 laps.    Nustep L4 10 min. B UE/LE.  Discussed returning to gym based ex.  Supine hamstring/ knee to chest/ piriformis/ trunk rotn. Stretches L/R 3x each.    Standing step lunges (L/R) in //-bars with light UE assist.  Pt. Unable to complete task without UE assist.  Pt. Able to step forward with no UE assist but requires hands on //-bars to bring leg back to neutral position.  5x each.      PATIENT EDUCATION:  Education details: HEP/ upright posture/ Sit stands Person educated: Patient Education method: Customer service manager Education comprehension: verbalized understanding and returned demonstration   HOME EXERCISE PROGRAM: Next tx. Session.    ASSESSMENT:  CLINICAL IMPRESSION: Patient works hard during PT tx. Session with focus on posture correction/ hip flexion and LE strengthening.  No significant c/o low back pain but moderate forward flexed posture/ head position during seated and standing there.ex.  Pt. Requires heavy UE assist in //-bars and with RW for all mobility tasks.  Pt focused on maintaining pt. BOS and decreasing use of UE with standing/ wt. Shifting. Pt. Able to get on/off mat table independently with no c/o back pain.  Pt. Will benefit from skilled PT services in combination with gym based ex. To improve LE strengthening/ pain-free mobility.     OBJECTIVE IMPAIRMENTS Abnormal gait, cardiopulmonary status limiting activity, decreased activity tolerance, decreased balance, decreased coordination, decreased endurance, decreased mobility, difficulty walking, decreased ROM, decreased strength, impaired flexibility, improper body mechanics, postural dysfunction, and pain.   ACTIVITY LIMITATIONS carrying, lifting, bending, standing, squatting, transfers, bed mobility, bathing, toileting, dressing, and locomotion level  PARTICIPATION LIMITATIONS: meal prep, cleaning, laundry, driving, shopping, and community  activity  PERSONAL FACTORS Age, Fitness, Past/current experiences, and 3+ comorbidities:      REHAB POTENTIAL: Good  CLINICAL DECISION MAKING: Evolving/moderate complexity  EVALUATION COMPLEXITY: Moderate   GOALS: Goals reviewed with patient? Yes  SHORT TERM GOALS: Target date: 12/08/21  Pt. Will be independent with HEP to increase LE muscle strength 1/2 muscle grade to improve transfers/ safety with gait.  Baseline:  see above Goal status: INITIAL   LONG TERM GOALS: Target date: 01/05/22  Pt. Will increase FOTO to 63 to improve pain-free mobility.   Baseline: initial FOTO 57 Goal status: INITIAL  2.  Pt. Able to stand from 21# chair height with no UE assist to improve transfers/ independence with daily activity.   Baseline:  Goal status: INITIAL  3.  Pt. Will decrease 5xSTS to <30 sec. To improve functional mobility/ decrease fall risk.  Baseline: 58sec. From 23" blue mat table (challenged). Goal status: INITIAL   PLAN: PT FREQUENCY: 1-2x/week  PT DURATION: 8 weeks  PLANNED INTERVENTIONS: Therapeutic exercises, Therapeutic activity, Neuromuscular re-education, Balance training, Gait training, Patient/Family education, Self Care, Joint mobilization, Stair training, Aquatic Therapy, Moist heat, Manual therapy, and Re-evaluation.  PLAN FOR NEXT SESSION: Progress HEP/ pain mgmt. For low back  Pura Spice, PT, DPT # [phone] 12/04/2021, 6:39 PM

## 2021-12-06 ENCOUNTER — Encounter: Payer: Medicare HMO | Admitting: Physical Therapy

## 2021-12-11 ENCOUNTER — Ambulatory Visit: Payer: Medicare HMO | Admitting: Physical Therapy

## 2021-12-11 DIAGNOSIS — R269 Unspecified abnormalities of gait and mobility: Secondary | ICD-10-CM | POA: Diagnosis not present

## 2021-12-11 DIAGNOSIS — M6281 Muscle weakness (generalized): Secondary | ICD-10-CM | POA: Diagnosis not present

## 2021-12-11 DIAGNOSIS — R2689 Other abnormalities of gait and mobility: Secondary | ICD-10-CM | POA: Diagnosis not present

## 2021-12-11 DIAGNOSIS — R293 Abnormal posture: Secondary | ICD-10-CM | POA: Diagnosis not present

## 2021-12-11 DIAGNOSIS — M5459 Other low back pain: Secondary | ICD-10-CM | POA: Diagnosis not present

## 2021-12-11 NOTE — Therapy (Signed)
OUTPATIENT PHYSICAL THERAPY THORACOLUMBAR TREATMENT   Patient Name: Hannah Powell MRN: [id number] DOB:Feb 02, 1946, 76 y.o., female Today's Date: 12/11/2021   PT End of Session - 12/11/21 1345     Visit Number 4    Number of Visits 17    Date for PT Re-Evaluation 01/05/22    PT Start Time 1341    PT Stop Time 1438    PT Time Calculation (min) 57 min    Activity Tolerance Patient tolerated treatment well    Behavior During Therapy [facility] for tasks assessed/performed             Past Medical History:  Diagnosis Date   Anxiety    Arthritis    CHF (congestive heart failure) (HCC)    COPD (chronic obstructive pulmonary disease) ([place])    Dysplastic nevus 04/22/2019   R abdomen parallel to sup umbilicus - mod   Dysplastic nevus 04/22/2019   R low back above waistline - mild    Edema    feet/legs   Hypertension    dr Otho Najjar      Hypothyroidism    Leg weakness, bilateral    Osteoporosis    Oxygen deficit    2l with bipap at night   RLS (restless legs syndrome)    Shortness of breath    Sleep apnea    bipap   Wheezing    Past Surgical History:  Procedure Laterality Date   BACK SURGERY     cervical   BREAST BIOPSY Left    needle bx-neg   CATARACT EXTRACTION W/PHACO Left 05/07/2017   Procedure: CATARACT EXTRACTION PHACO AND INTRAOCULAR LENS PLACEMENT ([place]);  Surgeon: Birder Robson, MD;  Location: [facility];  Service: Ophthalmology;  Laterality: Left;  [place] 00:48.7 AP% 11.3 CDE 5.46 Fluid Pack Lot # [id number] H   CATARACT EXTRACTION W/PHACO Right 04/23/2017   Procedure: CATARACT EXTRACTION PHACO AND INTRAOCULAR LENS PLACEMENT (I[facility]);  Surgeon: Birder Robson, MD;  Location: [facility];  Service: Ophthalmology;  Laterality: Right;  [place] 00:29 AP% 12.8 CDE 3.79 FLUID PACK LOT # [id number] H   FOOT ARTHROPLASTY     JOINT REPLACEMENT     x2 tkr   TOTAL SHOULDER ARTHROPLASTY Left 07/02/2013   Procedure: LEFT TOTAL SHOULDER ARTHROPLASTY;  Surgeon: Marin Shutter, MD;   Location: [place];  Service: Orthopedics;  Laterality: Left;   TOTAL SHOULDER ARTHROPLASTY Right 04/10/2018   Procedure: TOTAL REVERSE SHOULDER ARTHROPLASTY;  Surgeon: Justice Britain, MD;  [facility];  Service: Orthopedics;  Laterality: Right;  117mn   Patient Active Problem List   Diagnosis Date Noted   S/P reverse total shoulder arthroplasty, 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   Chronic diastolic heart failure ([place] 08/09/2016   COPD (chronic obstructive pulmonary disease) ([place] 08/09/2016   Pulmonary infiltrates    Pneum[place] due to Streptococcus pneumoniae [place]    Fever    Community acquired pneumonia of right lung    Palliative care by specialist    Goals of care, counseling/discussion    Pressure injury of skin 07/03/2016   [place]D exacerbation ([place] 07/21/2015   Acute renal insufficiency 07/21/2015   Hyperglycemia 07/21/2015   Benign essential HTN 07/21/2015   S/P shoulder replacement 07/02/2013    PCP: KAdin Hector MD  REFERRING PROVIDER: KAdin Hector MD  REFERRING DIAG: Low back pain  Rationale for Evaluation and Treatment Rehabilitation  THERAPY DIAG:  Other low back pain  Muscle weakness (generalized)  Gait difficulty  Abnormal posture  Imbalance  ONSET DATE: 03/20/2019  SUBJECTIVE:                                                                                                                                                                                           SUBJECTIVE STATEMENT:  EVALUATION Pt. Reports chronic h/o lumbar stenosis/ back pain.  Pt. Reports 8/10 low back pain in morning and uses heating pad/ ibuprofen to decrease back pain to 1/10 after about 2 hours.  Pt. Has returned to [employer] and aquatic ex. Recently.    PERTINENT HISTORY:  See MD notes.    PAIN:  Are you having pain? Yes: NPRS scale: 1-2/10 Pain location: low back Pain description: aching/ stiff Aggravating factors: prolonged activity Relieving factors: heat/  ibuprofen   PRECAUTIONS: Fall  WEIGHT BEARING RESTRICTIONS No  FALLS:  Has patient fallen in last 6 months? No  LIVING ENVIRONMENT: Lives with: lives with their spouse Lives in: House/apartment  OCCUPATION: Retired  PLOF: Independent with household mobility with device  PATIENT GOALS Increase ability to stand from chair/ improve upright posture/ decrease low back pain.     OBJECTIVE:   PATIENT SURVEYS:  FOTO initial 57/ goal 22  SCREENING FOR RED FLAGS: Bowel or bladder incontinence: No Spinal tumors: No Cauda equina syndrome: No Compression fracture: No Abdominal aneurysm: No  COGNITION:  Overall cognitive status: Within functional limits for tasks assessed     SENSATION: Not tested  POSTURE: rounded shoulders, forward head, and increased thoracic kyphosis  PALPATION: Tenderness over lumbar paraspinals.   LUMBAR ROM:   Active  A/PROM  eval  Flexion 50% limited  Extension 75%  Right lateral flexion 50%  Left lateral flexion 50%  Right rotation 50%  Left rotation 50%   (Blank rows = not tested)  LOWER EXTREMITY ROM:     B hip flexion/ knee extension limited.    LOWER EXTREMITY MMT:    B LE muscle strength grossly 4/5 MMT except hamstring 4+/5 MMT  5xSTS: 58sec. From 23" blue mat table (challenged).    Ascends steps with L LE and heavy UE assist.   GAIT: Distance walked: In clinic Assistive device utilized: Walker - 2 wheeled  Elevated forearm support Level of assistance: Modified independence Comments: Significant forward head/ flexed posture/ thoracic kyphosis   TODAY'S TREATMENT    12/11/21:   Subjective:  Pt. Went to gym on Wednesday/ Friday last week.  Pt. Reports L hip/low back discomfort while walking into PT clinic.  Pt. Reports no radicular symptoms at this time.      There.ex:    Nautilus: seated lat. Pull downs 40#/ standing tricep extension 30# 30x each.  Seated wand ex.: shoulder flexion/ chest press/ standing shoulder  extension/ IR 10x2   Standing hamstring stretches at stairs: 2x on L/R with static holds.  Step ups/ down on L/R at 1st step (6") with B UE assist 10x each.     Walking in //-bars with increase hip flexion/ step pattern (cuing to correct BOS)- forward/ backwards 4 laps.    Nustep L4 10 min. B UE/LE.  Discussed returning to gym based ex.  No supine stretches today    Walking out to car with RW and cuing to increase upright posture/ head position.    PATIENT EDUCATION:  Education details: HEP/ upright posture/ Sit stands Person educated: Patient Education method: Customer service manager Education comprehension: verbalized understanding and returned demonstration   HOME EXERCISE PROGRAM: Exercises at [employer] use of recumbent bike.    ASSESSMENT:  CLINICAL IMPRESSION: Pt. Reports slight increase in LBP during step ups at stairs and pain resolves while walking over to Nustep. Pt. Requires heavy UE assist in //-bars and with RW for all mobility tasks.  Pt focused on maintaining pt. BOS and decreasing use of UE with standing/ wt. Shifting. No LOB during tx. Session but cuing to correct head position/ upright posture.  Pt. Will benefit from skilled PT services in combination with gym based ex. To improve LE strengthening/ pain-free mobility.     OBJECTIVE IMPAIRMENTS Abnormal gait, cardiopulmonary status limiting activity, decreased activity tolerance, decreased balance, decreased coordination, decreased endurance, decreased mobility, difficulty walking, decreased ROM, decreased strength, impaired flexibility, improper body mechanics, postural dysfunction, and pain.   ACTIVITY LIMITATIONS carrying, lifting, bending, standing, squatting, transfers, bed mobility, bathing, toileting, dressing, and locomotion level  PARTICIPATION LIMITATIONS: meal prep, cleaning, laundry, driving, shopping, and community activity  PERSONAL FACTORS Age, Fitness, Past/current experiences, and 3+  comorbidities:      REHAB POTENTIAL: Good  CLINICAL DECISION MAKING: Evolving/moderate complexity  EVALUATION COMPLEXITY: Moderate   GOALS: Goals reviewed with patient? Yes  SHORT TERM GOALS: Target date: 12/08/21  Pt. Will be independent with HEP to increase LE muscle strength 1/2 muscle grade to improve transfers/ safety with gait.  Baseline:  see above Goal status: Partially met   LONG TERM GOALS: Target date: 01/05/22  Pt. Will increase FOTO to 63 to improve pain-free mobility.   Baseline: initial FOTO 57 Goal status: INITIAL  2.  Pt. Able to stand from 21# chair height with no UE assist to improve transfers/ independence with daily activity.   Baseline:  Goal status: INITIAL  3.  Pt. Will decrease 5xSTS to <30 sec. To improve functional mobility/ decrease fall risk.  Baseline: 58sec. From 23" blue mat table (challenged). Goal status: INITIAL   PLAN: PT FREQUENCY: 1-2x/week  PT DURATION: 8 weeks  PLANNED INTERVENTIONS: Therapeutic exercises, Therapeutic activity, Neuromuscular re-education, Balance training, Gait training, Patient/Family education, Self Care, Joint mobilization, Stair training, Aquatic Therapy, Moist heat, Manual therapy, and Re-evaluation.  PLAN FOR NEXT SESSION: Progress HEP/ pain mgmt. For low back  Pura Spice, PT, DPT # [id number] 12/11/2021, 7:53 PM

## 2021-12-14 ENCOUNTER — Encounter: Payer: Medicare HMO | Admitting: Physical Therapy

## 2021-12-18 ENCOUNTER — Encounter: Payer: Self-pay | Admitting: Physical Therapy

## 2021-12-18 ENCOUNTER — Ambulatory Visit: Payer: Medicare HMO | Attending: Internal Medicine | Admitting: Physical Therapy

## 2021-12-18 DIAGNOSIS — R293 Abnormal posture: Secondary | ICD-10-CM | POA: Diagnosis not present

## 2021-12-18 DIAGNOSIS — R2689 Other abnormalities of gait and mobility: Secondary | ICD-10-CM | POA: Insufficient documentation

## 2021-12-18 DIAGNOSIS — M5459 Other low back pain: Secondary | ICD-10-CM | POA: Diagnosis not present

## 2021-12-18 DIAGNOSIS — M6281 Muscle weakness (generalized): Secondary | ICD-10-CM | POA: Diagnosis not present

## 2021-12-18 DIAGNOSIS — R269 Unspecified abnormalities of gait and mobility: Secondary | ICD-10-CM | POA: Insufficient documentation

## 2021-12-18 NOTE — Therapy (Signed)
OUTPATIENT PHYSICAL THERAPY THORACOLUMBAR TREATMENT   Patient Name: Hannah Powell MRN: [id number] DOB:April 07, 1945, 76 y.o., female Today's Date: 12/18/2021   PT End of Session - 12/18/21 1353     Visit Number 5    Number of Visits 17    Date for PT Re-Evaluation 01/05/22    PT Start Time 1344    PT Stop Time 1435    PT Time Calculation (min) 51 min    Activity Tolerance Patient tolerated treatment well    Behavior During Therapy WFL for tasks assessed/performed             Past Medical History:  Diagnosis Date   Anxiety    Arthritis    CHF (congestive heart failure) (HCC)    COPD (chronic obstructive pulmonary disease) ([place])    Dysplastic nevus 04/22/2019   R abdomen parallel to sup umbilicus - mod   Dysplastic nevus 04/22/2019   R low back above waistline - mild    Edema    feet/legs   Hypertension    dr Otho Najjar      Hypothyroidism    Leg weakness, bilateral    Osteoporosis    Oxygen deficit    2l with bipap at night   RLS (restless legs syndrome)    Shortness of breath    Sleep apnea    bipap   Wheezing    Past Surgical History:  Procedure Laterality Date   BACK SURGERY     cervical   BREAST BIOPSY Left    needle bx-neg   CATARACT EXTRACTION W/PHACO Left 05/07/2017   Procedure: CATARACT EXTRACTION PHACO AND INTRAOCULAR LENS PLACEMENT ([place]);  Surgeon: Birder Robson, MD;  Location: [facility];  Service: Ophthalmology;  Laterality: Left;  [place] 00:48.7 AP% 11.3 CDE 5.46 Fluid Pack Lot # [id number]   CATARACT EXTRACTION W/PHACO Right 04/23/2017   Procedure: CATARACT EXTRACTION PHACO AND INTRAOCULAR LENS PLACEMENT ([facility]);  Surgeon: Birder Robson, MD;  Location: [facility];  Service: Ophthalmology;  Laterality: Right;  [place] 00:29 AP% 12.8 CDE 3.79 FLUID PACK LOT # [id number]   FOOT ARTHROPLASTY     JOINT REPLACEMENT     x2 tkr   TOTAL SHOULDER ARTHROPLASTY Left 07/02/2013   Procedure: LEFT TOTAL SHOULDER ARTHROPLASTY;  Surgeon: Marin Shutter, MD;   Location: [place];  Service: Orthopedics;  Laterality: Left;   TOTAL SHOULDER ARTHROPLASTY Right 04/10/2018   Procedure: TOTAL REVERSE SHOULDER ARTHROPLASTY;  Surgeon: Justice Britain, MD;  [facility];  Service: Orthopedics;  Laterality: Right;  139mn   Patient Active Problem List   Diagnosis Date Noted   S/P reverse total shoulder arthroplasty, 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   Chronic diastolic heart failure ([place] 08/09/2016   COPD (chronic obstructive pulmonary disease) ([place] 08/09/2016   Pulmonary infiltrates    Pneumonia due to Streptococcus pneumoniae ([place]    Fever    Community acquired pneumonia of right lung    Palliative care by specialist    Goals of care, counseling/discussion    Pressure injury of skin 07/03/2016   COPD exacerbation ([place] 07/21/2015   Acute renal insufficiency 07/21/2015   Hyperglycemia 07/21/2015   Benign essential HTN 07/21/2015   S/P shoulder replacement 07/02/2013    PCP: KAdin Hector MD  REFERRING PROVIDER: KAdin Hector MD  REFERRING DIAG: Low back pain  Rationale for Evaluation and Treatment Rehabilitation  THERAPY DIAG:  Other low back pain  Muscle weakness (generalized)  Gait difficulty  Abnormal posture  Imbalance  ONSET DATE: 03/20/2019  SUBJECTIVE:                                                                                                                                                                                           SUBJECTIVE STATEMENT:  EVALUATION Pt. Reports chronic h/o lumbar stenosis/ back pain.  Pt. Reports 8/10 low back pain in morning and uses heating pad/ ibuprofen to decrease back pain to 1/10 after about 2 hours.  Pt. Has returned to [employer] and aquatic ex. Recently.    PERTINENT HISTORY:  See MD notes.    PAIN:  Are you having pain? Yes: NPRS scale: 1-2/10 Pain location: low back Pain description: aching/ stiff Aggravating factors: prolonged activity Relieving factors: heat/  ibuprofen   PRECAUTIONS: Fall  WEIGHT BEARING RESTRICTIONS No  FALLS:  Has patient fallen in last 6 months? No  LIVING ENVIRONMENT: Lives with: lives with their spouse Lives in: House/apartment  OCCUPATION: Retired  PLOF: Independent with household mobility with device  PATIENT GOALS Increase ability to stand from chair/ improve upright posture/ decrease low back pain.     OBJECTIVE:   PATIENT SURVEYS:  FOTO initial 57/ goal 78  SCREENING FOR RED FLAGS: Bowel or bladder incontinence: No Spinal tumors: No Cauda equina syndrome: No Compression fracture: No Abdominal aneurysm: No  COGNITION:  Overall cognitive status: Within functional limits for tasks assessed     SENSATION: Not tested  POSTURE: rounded shoulders, forward head, and increased thoracic kyphosis  PALPATION: Tenderness over lumbar paraspinals.   LUMBAR ROM:   Active  A/PROM  eval  Flexion 50% limited  Extension 75%  Right lateral flexion 50%  Left lateral flexion 50%  Right rotation 50%  Left rotation 50%   (Blank rows = not tested)  LOWER EXTREMITY ROM:     B hip flexion/ knee extension limited.    LOWER EXTREMITY MMT:    B LE muscle strength grossly 4/5 MMT except hamstring 4+/5 MMT  5xSTS: 58sec. From 23" blue mat table (challenged).    Ascends steps with L LE and heavy UE assist.   GAIT: Distance walked: In clinic Assistive device utilized: Walker - 2 wheeled  Elevated forearm support Level of assistance: Modified independence Comments: Significant forward head/ flexed posture/ thoracic kyphosis   TODAY'S TREATMENT    12/18/21:   Subjective:  Pt. Went to gym on Wednesday/ Friday last week.  Pt. Reports minimal low back symptoms with no radicular symptoms at this time.  Pt. States she was active over weekend.  Pt. Is going to [place] next week and will return to PT in 2 weeks.  Then pt. Is heading to  [place] to see son/ family.     There.ex:    Nautilus: seated lat.  Pull downs 40#/ standing tricep extension/ scapular retraction 30# 30x each.     Seated wand ex.: shoulder flexion/ chest press 10x2   STS from gray chair with proper body mechanics/ control to return to sit.     Nustep L4 10 min. R UE/LE.  MH to L shoulder during ex.   Walking in //-bars (hurdles) with increase hip flexion/ step pattern (cuing to correct BOS)- forward/ backwards 4 laps.    No supine stretches today    Walking out to car with RW and cuing to increase upright posture/ head position.    PATIENT EDUCATION:  Education details: HEP/ upright posture/ Sit stands Person educated: Patient Education method: Customer service manager Education comprehension: verbalized understanding and returned demonstration   HOME EXERCISE PROGRAM: Exercises at [employer] use of recumbent bike.    ASSESSMENT:  CLINICAL IMPRESSION: Pt. Requires heavy UE assist in //-bars and with RW for all mobility tasks.  Pt focused on maintaining pt. BOS and decreasing use of UE with standing/ wt. Shifting. No LOB during tx and no c/o low back symptoms.  Pt. Has L shoulder discomfort with overhead reaching/ UE use.  Pt. Will benefit from skilled PT services in combination with gym based ex. To improve LE strengthening/ pain-free mobility.     OBJECTIVE IMPAIRMENTS Abnormal gait, cardiopulmonary status limiting activity, decreased activity tolerance, decreased balance, decreased coordination, decreased endurance, decreased mobility, difficulty walking, decreased ROM, decreased strength, impaired flexibility, improper body mechanics, postural dysfunction, and pain.   ACTIVITY LIMITATIONS carrying, lifting, bending, standing, squatting, transfers, bed mobility, bathing, toileting, dressing, and locomotion level  PARTICIPATION LIMITATIONS: meal prep, cleaning, laundry, driving, shopping, and community activity  PERSONAL FACTORS Age, Fitness, Past/current experiences, and 3+ comorbidities:       REHAB POTENTIAL: Good  CLINICAL DECISION MAKING: Evolving/moderate complexity  EVALUATION COMPLEXITY: Moderate   GOALS: Goals reviewed with patient? Yes  SHORT TERM GOALS: Target date: 12/08/21  Pt. Will be independent with HEP to increase LE muscle strength 1/2 muscle grade to improve transfers/ safety with gait.  Baseline:  see above Goal status: Partially met   LONG TERM GOALS: Target date: 01/05/22  Pt. Will increase FOTO to 63 to improve pain-free mobility.   Baseline: initial FOTO 57 Goal status: INITIAL  2.  Pt. Able to stand from 21# chair height with no UE assist to improve transfers/ independence with daily activity.   Baseline:  Goal status: INITIAL  3.  Pt. Will decrease 5xSTS to <30 sec. To improve functional mobility/ decrease fall risk.  Baseline: 58sec. From 23" blue mat table (challenged). Goal status: INITIAL   PLAN: PT FREQUENCY: 1-2x/week  PT DURATION: 8 weeks  PLANNED INTERVENTIONS: Therapeutic exercises, Therapeutic activity, Neuromuscular re-education, Balance training, Gait training, Patient/Family education, Self Care, Joint mobilization, Stair training, Aquatic Therapy, Moist heat, Manual therapy, and Re-evaluation.  PLAN FOR NEXT SESSION: Progress HEP/ pain mgmt. For low back.  [place].   Pura Spice, PT, DPT # [phone] 12/18/2021, 3:53 PM

## 2021-12-20 ENCOUNTER — Encounter: Payer: Medicare HMO | Admitting: Physical Therapy

## 2021-12-25 ENCOUNTER — Encounter: Payer: Medicare HMO | Admitting: Physical Therapy

## 2022-01-01 ENCOUNTER — Ambulatory Visit: Payer: Medicare HMO | Admitting: Physical Therapy

## 2022-01-01 DIAGNOSIS — M6281 Muscle weakness (generalized): Secondary | ICD-10-CM | POA: Diagnosis not present

## 2022-01-01 DIAGNOSIS — R293 Abnormal posture: Secondary | ICD-10-CM | POA: Diagnosis not present

## 2022-01-01 DIAGNOSIS — R269 Unspecified abnormalities of gait and mobility: Secondary | ICD-10-CM

## 2022-01-01 DIAGNOSIS — M5459 Other low back pain: Secondary | ICD-10-CM | POA: Diagnosis not present

## 2022-01-01 DIAGNOSIS — R2689 Other abnormalities of gait and mobility: Secondary | ICD-10-CM | POA: Diagnosis not present

## 2022-01-01 NOTE — Therapy (Signed)
OUTPATIENT PHYSICAL THERAPY THORACOLUMBAR TREATMENT   Patient Name: Hannah Powell MRN: [id number] DOB:[birth year], 76 y.o., female Today's Date: 01/01/2022   PT End of Session - 01/01/22 1333     Visit Number 6    Number of Visits 17    Date for PT Re-Evaluation 01/05/22    PT Start Time 5035    PT Stop Time 1430    PT Time Calculation (min) 63 min    Activity Tolerance Patient tolerated treatment well    Behavior During Therapy WFL for tasks assessed/performed             Past Medical History:  Diagnosis Date   Anxiety    Arthritis    CHF (congestive heart failure) (HCC)    COPD (chronic obstructive pulmonary disease) ([place])    Dysplastic nevus 04/22/2019   R abdomen parallel to sup umbilicus - mod   Dysplastic nevus 04/22/2019   R low back above waistline - mild    Edema    feet/legs   Hypertension    dr Otho Najjar      Hypothyroidism    Leg weakness, bilateral    Osteoporosis    Oxygen deficit    2l with bipap at night   RLS (restless legs syndrome)    Shortness of breath    Sleep apnea    bipap   Wheezing    Past Surgical History:  Procedure Laterality Date   BACK SURGERY     cervical   BREAST BIOPSY Left    needle bx-neg   CATARACT EXTRACTION W/PHACO Left 05/07/2017   Procedure: CATARACT EXTRACTION PHACO AND INTRAOCULAR LENS PLACEMENT ([place]);  Surgeon: Birder Robson, MD;  Location: [facility];  Service: Ophthalmology;  Laterality: Left;  [place] 00:48.7 AP% 11.3 CDE 5.46 Fluid Pack Lot # [id number] H   CATARACT EXTRACTION W/PHACO Right 04/23/2017   Procedure: CATARACT EXTRACTION PHACO AND INTRAOCULAR LENS PLACEMENT ([facility]);  Surgeon: Birder Robson, MD;  Location: [facility];  Service: Ophthalmology;  Laterality: Right;  [place] 00:29 AP% 12.8 CDE 3.79 FLUID PACK LOT # [id number] H   FOOT ARTHROPLASTY     JOINT REPLACEMENT     x2 tkr   TOTAL SHOULDER ARTHROPLASTY Left 07/02/2013   Procedure: LEFT TOTAL SHOULDER ARTHROPLASTY;  Surgeon: Marin Shutter, MD;   Location: [place];  Service: Orthopedics;  Laterality: Left;   TOTAL SHOULDER ARTHROPLASTY Right 04/10/2018   Procedure: TOTAL REVERSE SHOULDER ARTHROPLASTY;  Surgeon: Justice Britain, MD;  [facility];  Service: Orthopedics;  Laterality: Right;  157mn   Patient Active Problem List   Diagnosis Date Noted   S/P reverse total shoulder arthroplasty, 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   Chronic diastolic heart failure ([facility] 08/09/2016   COPD (chronic obstructive pulmonary disease) ([facility] 08/09/2016   Pulmonary infiltrates    Pneumonia due to Streptococcus pneumoniae ([place] [place]    Fever    Community acquired pneumonia of right lung    Palliative care by specialist    Goals of care, counseling/discussion    Pressure injury of skin 07/03/2016   COPD exacerbation ([place] 07/21/2015   Acute renal insufficiency 07/21/2015   Hyperglycemia 07/21/2015   Benign essential HTN 07/21/2015   S/P shoulder replacement 07/02/2013    PCP: KAdin Hector MD  REFERRING PROVIDER: KAdin Hector MD  REFERRING DIAG: Low back pain  Rationale for Evaluation and Treatment Rehabilitation  THERAPY DIAG:  Other low back pain  Muscle weakness (generalized)  Gait difficulty  Abnormal posture  Imbalance  ONSET DATE: 03/20/2019  SUBJECTIVE:                                                                                                                                                                                           SUBJECTIVE STATEMENT:  EVALUATION Pt. Reports chronic h/o lumbar stenosis/ back pain.  Pt. Reports 8/10 low back pain in morning and uses heating pad/ ibuprofen to decrease back pain to 1/10 after about 2 hours.  Pt. Has returned to [employer] and aquatic ex. Recently.    PERTINENT HISTORY:  See MD notes.    PAIN:  Are you having pain? Yes: NPRS scale: 1-2/10 Pain location: low back Pain description: aching/ stiff Aggravating factors: prolonged activity Relieving factors: heat/  ibuprofen   PRECAUTIONS: Fall  WEIGHT BEARING RESTRICTIONS No  FALLS:  Has patient fallen in last 6 months? No  LIVING ENVIRONMENT: Lives with: lives with their spouse Lives in: House/apartment  OCCUPATION: Retired  PLOF: Independent with household mobility with device  PATIENT GOALS Increase ability to stand from chair/ improve upright posture/ decrease low back pain.     OBJECTIVE:   PATIENT SURVEYS:  FOTO initial 57/ goal 52  SCREENING FOR RED FLAGS: Bowel or bladder incontinence: No Spinal tumors: No Cauda equina syndrome: No Compression fracture: No Abdominal aneurysm: No  COGNITION:  Overall cognitive status: Within functional limits for tasks assessed     SENSATION: Not tested  POSTURE: rounded shoulders, forward head, and increased thoracic kyphosis  PALPATION: Tenderness over lumbar paraspinals.   LUMBAR ROM:   Active  A/PROM  eval  Flexion 50% limited  Extension 75%  Right lateral flexion 50%  Left lateral flexion 50%  Right rotation 50%  Left rotation 50%   (Blank rows = not tested)  LOWER EXTREMITY ROM:     B hip flexion/ knee extension limited.    LOWER EXTREMITY MMT:    B LE muscle strength grossly 4/5 MMT except hamstring 4+/5 MMT  5xSTS: 58sec. From 23" blue mat table (challenged).    Ascends steps with L LE and heavy UE assist.   GAIT: Distance walked: In clinic Assistive device utilized: Walker - 2 wheeled  Elevated forearm support Level of assistance: Modified independence Comments: Significant forward head/ flexed posture/ thoracic kyphosis   TODAY'S TREATMENT    01/01/22:   Subjective:  Pt. Had a nice trip to [place] over past week.  Pt. Is planning to drive to [place] and spend a week with family in [place].     There.ex:    Nustep L4 10 min. R UE/LE.    Nautilus: seated lat. Pull downs 40#/ standing tricep extension 30x  each.     Seated wand ex.: shoulder flexion/ chest press 10x2   5xSTS from gray  chair: B UE assist on handrails required 19.7 seconds.     Walking in //-bars: forward with added alt. UE and LE touches.    No supine stretches today    Walking out to car with RW and cuing to increase upright posture/ head position.  Pt. Challenged with sidewalk ramp.      Discussed HEP   PATIENT EDUCATION:  Education details: HEP/ upright posture/ Sit stands Person educated: Patient Education method: Customer service manager Education comprehension: verbalized understanding and returned demonstration   HOME EXERCISE PROGRAM: Exercises at [employer] use of recumbent bike.    ASSESSMENT:  CLINICAL IMPRESSION: Pt. Requires heavy UE assist in //-bars and with RW for all mobility tasks.  Pt focused on maintaining pt. BOS and decreasing use of UE with standing/ wt. Shifting. No LOB during tx and no c/o low back symptoms.  Pt. Instructed to contact PT after upcoming trip to [place] to discuss status.   See updated goals.  Pt. Will benefit from skilled PT services in combination with gym based ex. To improve LE strengthening/ pain-free mobility.     OBJECTIVE IMPAIRMENTS Abnormal gait, cardiopulmonary status limiting activity, decreased activity tolerance, decreased balance, decreased coordination, decreased endurance, decreased mobility, difficulty walking, decreased ROM, decreased strength, impaired flexibility, improper body mechanics, postural dysfunction, and pain.   ACTIVITY LIMITATIONS carrying, lifting, bending, standing, squatting, transfers, bed mobility, bathing, toileting, dressing, and locomotion level  PARTICIPATION LIMITATIONS: meal prep, cleaning, laundry, driving, shopping, and community activity  PERSONAL FACTORS Age, Fitness, Past/current experiences, and 3+ comorbidities:      REHAB POTENTIAL: Good  CLINICAL DECISION MAKING: Evolving/moderate complexity  EVALUATION COMPLEXITY: Moderate   GOALS: Goals reviewed with patient? Yes  SHORT TERM GOALS:  Target date: 12/08/21  Pt. Will be independent with HEP to increase LE muscle strength 1/2 muscle grade to improve transfers/ safety with gait.  Baseline:  see above Goal status: Partially met   LONG TERM GOALS: Target date: 01/05/22  Pt. Will increase FOTO to 63 to improve pain-free mobility.   Baseline: initial FOTO 57.  10/16: 54 (no improvement)- limited with mobility.  Goal status: Not met  2.  Pt. Able to stand from 21# chair height with no UE assist to improve transfers/ independence with daily activity.   Baseline:  Goal status: Not met  3.  Pt. Will decrease 5xSTS to <30 sec. To improve functional mobility/ decrease fall risk.  Baseline: 58sec. From 23" blue mat table (challenged).  10/16: gray chair with UE assist: 19.7 seconds Goal status: Partially met   PLAN: PT FREQUENCY: 1-2x/week  PT DURATION: 8 weeks  PLANNED INTERVENTIONS: Therapeutic exercises, Therapeutic activity, Neuromuscular re-education, Balance training, Gait training, Patient/Family education, Self Care, Joint mobilization, Stair training, Aquatic Therapy, Moist heat, Manual therapy, and Re-evaluation.  PLAN FOR NEXT SESSION: Pt. Will call PT after [place], DPT # [phone] 01/01/2022, 5:59 PM

## 2022-01-08 ENCOUNTER — Encounter: Payer: Medicare HMO | Admitting: Physical Therapy

## 2022-01-22 DIAGNOSIS — Z87891 Personal history of nicotine dependence: Secondary | ICD-10-CM | POA: Diagnosis not present

## 2022-01-22 DIAGNOSIS — I5032 Chronic diastolic (congestive) heart failure: Secondary | ICD-10-CM | POA: Diagnosis not present

## 2022-01-22 DIAGNOSIS — N183 Chronic kidney disease, stage 3 unspecified: Secondary | ICD-10-CM | POA: Diagnosis not present

## 2022-01-22 DIAGNOSIS — J9611 Chronic respiratory failure with hypoxia: Secondary | ICD-10-CM | POA: Diagnosis not present

## 2022-01-22 DIAGNOSIS — J449 Chronic obstructive pulmonary disease, unspecified: Secondary | ICD-10-CM | POA: Diagnosis not present

## 2022-01-22 DIAGNOSIS — J069 Acute upper respiratory infection, unspecified: Secondary | ICD-10-CM | POA: Diagnosis not present

## 2022-01-26 ENCOUNTER — Ambulatory Visit: Payer: Medicare HMO | Attending: Internal Medicine | Admitting: Physical Therapy

## 2022-01-26 DIAGNOSIS — R293 Abnormal posture: Secondary | ICD-10-CM | POA: Diagnosis not present

## 2022-01-26 DIAGNOSIS — R2689 Other abnormalities of gait and mobility: Secondary | ICD-10-CM | POA: Insufficient documentation

## 2022-01-26 DIAGNOSIS — M6281 Muscle weakness (generalized): Secondary | ICD-10-CM | POA: Diagnosis not present

## 2022-01-26 DIAGNOSIS — R269 Unspecified abnormalities of gait and mobility: Secondary | ICD-10-CM | POA: Diagnosis not present

## 2022-01-26 DIAGNOSIS — M5459 Other low back pain: Secondary | ICD-10-CM | POA: Insufficient documentation

## 2022-01-26 NOTE — Therapy (Signed)
OUTPATIENT PHYSICAL THERAPY THORACOLUMBAR TREATMENT/ RECERTIFICATION   Patient Name: Hannah Powell MRN: [id number] DOB:May 28, 1945, 76 y.o., female Today's Date: 01/26/2022   PT End of Session - 01/26/22 0815     Visit Number 7 of 19   04/20/2022   PT Start Time 0811 to 0902  (55 minutes)   Activity Tolerance Patient tolerated treatment well    Behavior During Therapy [facility] for tasks assessed/performed          Past Medical History:  Diagnosis Date   Anxiety    Arthritis    CHF (congestive heart failure) (HCC)    COPD (chronic obstructive pulmonary disease) ([facility])    Dysplastic nevus 04/22/2019   R abdomen parallel to sup umbilicus - mod   Dysplastic nevus 04/22/2019   R low back above waistline - mild    Edema    feet/legs   Hypertension    dr Otho Najjar      Hypothyroidism    Leg weakness, bilateral    Osteoporosis    Oxygen deficit    2l with bipap at night   RLS (restless legs syndrome)    Shortness of breath    Sleep apnea    bipap   Wheezing    Past Surgical History:  Procedure Laterality Date   BACK SURGERY     cervical   BREAST BIOPSY Left    needle bx-neg   CATARACT EXTRACTION W/PHACO Left 05/07/2017   Procedure: CATARACT EXTRACTION PHACO AND INTRAOCULAR LENS PLACEMENT ([place]);  Surgeon: Birder Robson, MD;  Location: [facility];  Service: Ophthalmology;  Laterality: Left;  [place] 00:48.7 AP% 11.3 CDE 5.46 Fluid Pack Lot # [id number]   CATARACT EXTRACTION W/PHACO Right 04/23/2017   Procedure: CATARACT EXTRACTION PHACO AND INTRAOCULAR LENS PLACEMENT ([facility]);  Surgeon: Birder Robson, MD;  Location: [facility];  Service: Ophthalmology;  Laterality: Right;  [place] 00:29 AP% 12.8 CDE 3.79 FLUID PACK LOT # [id number]   FOOT ARTHROPLASTY     JOINT REPLACEMENT     x2 tkr   TOTAL SHOULDER ARTHROPLASTY Left 07/02/2013   Procedure: LEFT TOTAL SHOULDER ARTHROPLASTY;  Surgeon: Marin Shutter, MD;  Location: [place];  Service: Orthopedics;  Laterality: Left;   TOTAL SHOULDER  ARTHROPLASTY Right 04/10/2018   Procedure: TOTAL REVERSE SHOULDER ARTHROPLASTY;  Surgeon: Justice Britain, MD;  [facility];  Service: Orthopedics;  Laterality: Right;  146mn   Patient Active Problem List   Diagnosis Date Noted   S/P reverse total shoulder arthroplasty, 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   Chronic diastolic heart failure ([place] 08/09/2016   COPD (chronic obstructive pulmonary disease) ([place] 08/09/2016   Pulmonary infiltrates    Pneumonia due to Streptococcus pneumoniae ([place]    Fever    Community acquired pneumonia of right lung    Palliative care by specialist    Goals of care, counseling/discussion    Pressure injury of skin 07/03/2016   [place]D [place] ([place] 07/21/2015   Acute renal insufficiency 07/21/2015   Hyperglycemia 07/21/2015   Benign essential HTN 07/21/2015   S/P shoulder replacement 07/02/2013    PCP: KAdin Hector MD  REFERRING PROVIDER: KAdin Hector MD  REFERRING DIAG: Low back pain  Rationale for Evaluation and Treatment Rehabilitation  THERAPY DIAG:  Other low back pain  Muscle weakness (generalized)  Gait difficulty  Abnormal posture  Imbalance  ONSET DATE: 03/20/2019  SUBJECTIVE:  SUBJECTIVE STATEMENT:  EVALUATION Pt. Reports chronic h/o lumbar stenosis/ back pain.  Pt. Reports 8/10 low back pain in morning and uses heating pad/ ibuprofen to decrease back pain to 1/10 after about 2 hours.  Pt. Has returned to [employer] and aquatic ex. Recently.    PERTINENT HISTORY:  See MD notes.    PAIN:  Are you having pain? Yes: NPRS scale: 1-2/10 Pain location: low back Pain description: aching/ stiff Aggravating factors: prolonged activity Relieving factors: heat/ ibuprofen   PRECAUTIONS: Fall  WEIGHT BEARING RESTRICTIONS No  FALLS:  Has  patient fallen in last 6 months? No  LIVING ENVIRONMENT: Lives with: lives with their spouse Lives in: House/apartment  OCCUPATION: Retired  PLOF: Independent with household mobility with device  PATIENT GOALS Increase ability to stand from chair/ improve upright posture/ decrease low back pain.     OBJECTIVE:   PATIENT SURVEYS:  FOTO initial 57/ goal 62  SCREENING FOR RED FLAGS: Bowel or bladder incontinence: No Spinal tumors: No Cauda equina syndrome: No Compression fracture: No Abdominal aneurysm: No  COGNITION:  Overall cognitive status: Within functional limits for tasks assessed     SENSATION: Not tested  POSTURE: rounded shoulders, forward head, and increased thoracic kyphosis  PALPATION: Tenderness over lumbar paraspinals.   LUMBAR ROM:   Active  A/PROM  eval  Flexion 50% limited  Extension 75%  Right lateral flexion 50%  Left lateral flexion 50%  Right rotation 50%  Left rotation 50%   (Blank rows = not tested)  LOWER EXTREMITY ROM:     B hip flexion/ knee extension limited.    LOWER EXTREMITY MMT:    B LE muscle strength grossly 4/5 MMT except hamstring 4+/5 MMT  5xSTS: 58sec. From 23" blue mat table (challenged).    Ascends steps with L LE and heavy UE assist.   GAIT: Distance walked: In clinic Assistive device utilized: Walker - 2 wheeled  Elevated forearm support Level of assistance: Modified independence Comments: Significant forward head/ flexed posture/ thoracic kyphosis   TODAY'S TREATMENT    01/26/22:   Subjective:  Pt. Has been out of town to visit son in [place] and then was recovering from illness.  Pt. Reports feeling better but hasn't been able to exercise secondary to trip/ illness.  Pt. Hoping to return to gym/ aquatic ex. Next week.     There.ex:    Nustep L4 10 min. B UE/LE.  Discussed trip to [place] and gym based ex.     Standing 6" step touches with B UE assist and attempting L UE only (difficulty)- fear of  falling.     Nautilus: seated lat. Pull downs 40#/ standing tricep extension 30x each.     B hip strength grossly 4/5 MMT and quad/hamstring 4+/5 MMT.     Seated wand ex.: shoulder flexion/ chest press 10x2   5xSTS from gray chair: B UE assist on handrails required 19.7 seconds.     Walking in //-bars: forward with added alt. UE and LE touches.      Walking out to car with RW and cuing to increase upright posture/ head position.  Pt. Challenged with sidewalk ramp.      Discussed HEP   PATIENT EDUCATION:  Education details: HEP/ upright posture/ Sit stands Person educated: Patient Education method: Customer service manager Education comprehension: verbalized understanding and returned demonstration   HOME EXERCISE PROGRAM: Exercises at [employer] use of recumbent bike.    ASSESSMENT:  CLINICAL IMPRESSION: Pt. Continues to require  heavy UE assist in //-bars and with RW for all mobility tasks.  Pt focused on maintaining pt. BOS and decreasing use of UE with standing/ wt. Shifting. No LOB during tx and no c/o low back symptoms.  See updated goals.  Pt. Will benefit from skilled PT services in combination with gym based ex. To improve LE strengthening/ pain-free mobility.     OBJECTIVE IMPAIRMENTS Abnormal gait, cardiopulmonary status limiting activity, decreased activity tolerance, decreased balance, decreased coordination, decreased endurance, decreased mobility, difficulty walking, decreased ROM, decreased strength, impaired flexibility, improper body mechanics, postural dysfunction, and pain.   ACTIVITY LIMITATIONS carrying, lifting, bending, standing, squatting, transfers, bed mobility, bathing, toileting, dressing, and locomotion level  PARTICIPATION LIMITATIONS: meal prep, cleaning, laundry, driving, shopping, and community activity  PERSONAL FACTORS Age, Fitness, Past/current experiences, and 3+ comorbidities:      REHAB POTENTIAL: Good  CLINICAL DECISION  MAKING: Evolving/moderate complexity  EVALUATION COMPLEXITY: Moderate   GOALS: Goals reviewed with patient? Yes  SHORT TERM GOALS: Target date: 02/23/22  Pt. Will be independent with HEP to increase LE muscle strength 1/2 muscle grade to improve transfers/ safety with gait.  Baseline:  see above.  11/10: B hip strength grossly 4/5 MMT and quad/hamstring 4+/5 MMT Goal status: Partially met   LONG TERM GOALS: Target date: 04/20/22  Pt. Will increase FOTO to 63 to improve pain-free mobility.   Baseline: initial FOTO 57.  10/16: 54 (no improvement)- limited with mobility.  Goal status: Not met  2.  Pt. Able to stand from 21# chair height with no UE assist to improve transfers/ independence with daily activity.   Baseline:  Goal status: Not met  3.  Pt. Will decrease 5xSTS to <30 sec. To improve functional mobility/ decrease fall risk.  Baseline: 58sec. From 23" blue mat table (challenged).  10/16: gray chair with UE assist: 19.7 seconds Goal status: Partially met   PLAN: PT FREQUENCY: 1-2x/week  PT DURATION: 12 weeks  PLANNED INTERVENTIONS: Therapeutic exercises, Therapeutic activity, Neuromuscular re-education, Balance training, Gait training, Patient/Family education, Self Care, Joint mobilization, Stair training, Aquatic Therapy, Moist heat, Manual therapy, and Re-evaluation.  PLAN FOR NEXT SESSION: Check [employer] authorization  Pura Spice, PT, DPT # [id number] 01/26/2022, 8:16 AM

## 2022-02-02 ENCOUNTER — Ambulatory Visit: Payer: Medicare HMO | Admitting: Physical Therapy

## 2022-02-02 DIAGNOSIS — M5459 Other low back pain: Secondary | ICD-10-CM | POA: Diagnosis not present

## 2022-02-02 DIAGNOSIS — R2689 Other abnormalities of gait and mobility: Secondary | ICD-10-CM

## 2022-02-02 DIAGNOSIS — M6281 Muscle weakness (generalized): Secondary | ICD-10-CM | POA: Diagnosis not present

## 2022-02-02 DIAGNOSIS — R293 Abnormal posture: Secondary | ICD-10-CM | POA: Diagnosis not present

## 2022-02-02 DIAGNOSIS — R269 Unspecified abnormalities of gait and mobility: Secondary | ICD-10-CM

## 2022-02-03 NOTE — Therapy (Signed)
OUTPATIENT PHYSICAL THERAPY THORACOLUMBAR TREATMENT   Patient Name: Hannah Powell MRN: [id number] DOB:[birth year], 76 y.o., female Today's Date: 02/02/2022   PT End of Session -     Visit Number 8 of 19   04/20/2022   PT Start Time 0733 to 0820  (71 minutes)   Activity Tolerance Patient tolerated treatment well    Behavior During Therapy [facility] for tasks assessed/performed          Past Medical History:  Diagnosis Date   Anxiety    Arthritis    CHF (congestive heart failure) (HCC)    COPD (chronic obstructive pulmonary disease) ([place])    Dysplastic nevus 04/22/2019   R abdomen parallel to sup umbilicus - mod   Dysplastic nevus 04/22/2019   R low back above waistline - mild    Edema    feet/legs   Hypertension    dr Otho Najjar      Hypothyroidism    Leg weakness, bilateral    Osteoporosis    Oxygen deficit    2l with bipap at night   RLS (restless legs syndrome)    Shortness of breath    Sleep apnea    bipap   Wheezing    Past Surgical History:  Procedure Laterality Date   BACK SURGERY     cervical   BREAST BIOPSY Left    needle bx-neg   CATARACT EXTRACTION W/PHACO Left 05/07/2017   Procedure: CATARACT EXTRACTION PHACO AND INTRAOCULAR LENS PLACEMENT ([place]);  Surgeon: Birder Robson, MD;  Location: [facility];  Service: Ophthalmology;  Laterality: Left;  [place] 00:48.7 AP% 11.3 CDE 5.46 Fluid Pack Lot # [id number] H   CATARACT EXTRACTION W/PHACO Right 04/23/2017   Procedure: CATARACT EXTRACTION PHACO AND INTRAOCULAR LENS PLACEMENT (IOC);  Surgeon: Birder Robson, MD;  Location: [facility];  Service: Ophthalmology;  Laterality: Right;  [place] 00:29 AP% 12.8 CDE 3.79 FLUID PACK LOT # [id number]   FOOT ARTHROPLASTY     JOINT REPLACEMENT     x2 tkr   TOTAL SHOULDER ARTHROPLASTY Left 07/02/2013   Procedure: LEFT TOTAL SHOULDER ARTHROPLASTY;  Surgeon: Marin Shutter, MD;  Location: [place];  Service: Orthopedics;  Laterality: Left;   TOTAL SHOULDER ARTHROPLASTY Right 04/10/2018    Procedure: TOTAL REVERSE SHOULDER ARTHROPLASTY;  Surgeon: Justice Britain, MD;  [facility];  Service: Orthopedics;  Laterality: Right;  177mn   Patient Active Problem List   Diagnosis Date Noted   S/P reverse total shoulder arthroplasty, 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   Chronic diastolic heart failure ([facility] 08/09/2016   COPD (ch[place] obstructive [place] disease) ([place] 08/09[place]2016   Pulmonary infiltrates    Pneumonia due to Streptococcus pneumoniae ([place]    Fever    Community acquired pneumonia of right lung    Palliative care by specialist    Goals of care, counseling/discussion    Pressure injury of skin 07/03/2016   [place]D exacerbation ([facility] 07/21/2015   Acute renal insufficiency 07/21/2015   Hyperglycemia 07/21/2015   Benign essential HTN 07/21/2015   S/P shoulder replacement 07/02/2013    PCP: KAdin Hector MD  REFERRING PROVIDER: KAdin Hector MD  REFERRING DIAG: Low back pain  Rationale for Evaluation and Treatment Rehabilitation  THERAPY DIAG:  Other low back pain  Muscle weakness (generalized)  Gait difficulty  Abnormal posture  Imbalance  ONSET DATE: 03/20/2019  SUBJECTIVE:  SUBJECTIVE STATEMENT:  EVALUATION Pt. Reports chronic h/o lumbar stenosis/ back pain.  Pt. Reports 8/10 low back pain in morning and uses heating pad/ ibuprofen to decrease back pain to 1/10 after about 2 hours.  Pt. Has returned to [employer] and aquatic ex. Recently.    PERTINENT HISTORY:  See MD notes.    PAIN:  Are you having pain? Yes: NPRS scale: 1-2/10 Pain location: low back Pain description: aching/ stiff Aggravating factors: prolonged activity Relieving factors: heat/ ibuprofen   PRECAUTIONS: Fall  WEIGHT BEARING RESTRICTIONS No  FALLS:  Has patient fallen in last 6 months?  No  LIVING ENVIRONMENT: Lives with: lives with their spouse Lives in: House/apartment  OCCUPATION: Retired  PLOF: Independent with household mobility with device  PATIENT GOALS Increase ability to stand from chair/ improve upright posture/ decrease low back pain.     OBJECTIVE:   PATIENT SURVEYS:  FOTO initial 57/ goal 1  SCREENING FOR RED FLAGS: Bowel or bladder incontinence: No Spinal tumors: No Cauda equina syndrome: No Compression fracture: No Abdominal aneurysm: No  COGNITION:  Overall cognitive status: Within functional limits for tasks assessed     SENSATION: Not tested  POSTURE: rounded shoulders, forward head, and increased thoracic kyphosis  PALPATION: Tenderness over lumbar paraspinals.   LUMBAR ROM:   Active  A/PROM  eval  Flexion 50% limited  Extension 75%  Right lateral flexion 50%  Left lateral flexion 50%  Right rotation 50%  Left rotation 50%   (Blank rows = not tested)  LOWER EXTREMITY ROM:     B hip flexion/ knee extension limited.    LOWER EXTREMITY MMT:    B LE muscle strength grossly 4/5 MMT except hamstring 4+/5 MMT  5xSTS: 58sec. From 23" blue mat table (challenged).    Ascends steps with L LE and heavy UE assist.   GAIT: Distance walked: In clinic Assistive device utilized: Walker - 2 wheeled  Elevated forearm support Level of assistance: Modified independence Comments: Significant forward head/ flexed posture/ thoracic kyphosis   TODAY'S TREATMENT    02/02/22:   Subjective:  Pt. Arrived to PT with no new complaints.  Pt. Reports no back pain at this time.      There.ex:    Nustep L4 10 min. B UE/LE.  Discussed upcoming weekend/ Thanksgiving events.    Nautilus: standing lat. Pull downs 50#/ standing tricep extension 30x each.  Chair behind pt. For safety.     Standing 2# bicep curls/ shoulder shrugs/ punches 20x.    Sitting on gray chair with use of [employer] pad to allow pt. To stand with less UE assist.      Walking in //-bars: forward/ backwards/ lateral 4 laps. (Fatigue).     Walking from clinic out to car with RW and cuing to increase upright posture/ head position.  No LOB/ cuing to correct head/ upright posture.     Discussed HEP   PATIENT EDUCATION:  Education details: HEP/ upright posture/ Sit stands Person educated: Patient Education method: Customer service manager Education comprehension: verbalized understanding and returned demonstration   HOME EXERCISE PROGRAM: Exercises at [employer] use of recumbent bike.    ASSESSMENT:  CLINICAL IMPRESSION: Pt. Continues to require heavy UE assist in //-bars and with RW for all mobility tasks.  Pt focused on maintaining pt. BOS and decreasing use of UE with standing/ wt. Shifting. No LOB during tx and no c/o low back symptoms. Pt. Fatigues easily with prolonged standing/ walking in //-bars with increase step  pattern.  Pt. Will benefit from skilled PT services in combination with gym based ex. To improve LE strengthening/ pain-free mobility.     OBJECTIVE IMPAIRMENTS Abnormal gait, cardiopulmonary status limiting activity, decreased activity tolerance, decreased balance, decreased coordination, decreased endurance, decreased mobility, difficulty walking, decreased ROM, decreased strength, impaired flexibility, improper body mechanics, postural dysfunction, and pain.   ACTIVITY LIMITATIONS carrying, lifting, bending, standing, squatting, transfers, bed mobility, bathing, toileting, dressing, and locomotion level  PARTICIPATION LIMITATIONS: meal prep, cleaning, laundry, driving, shopping, and community activity  PERSONAL FACTORS Age, Fitness, Past/current experiences, and 3+ comorbidities:      REHAB POTENTIAL: Good  CLINICAL DECISION MAKING: Evolving/moderate complexity  EVALUATION COMPLEXITY: Moderate   GOALS: Goals reviewed with patient? Yes  SHORT TERM GOALS: Target date: 02/23/22  Pt. Will be independent with  HEP to increase LE muscle strength 1/2 muscle grade to improve transfers/ safety with gait.  Baseline:  see above.  11/10: B hip strength grossly 4/5 MMT and quad/hamstring 4+/5 MMT Goal status: Partially met   LONG TERM GOALS: Target date: 04/20/22  Pt. Will increase FOTO to 63 to improve pain-free mobility.   Baseline: initial FOTO 57.  10/16: 54 (no improvement)- limited with mobility.  Goal status: Not met  2.  Pt. Able to stand from 21# chair height with no UE assist to improve transfers/ independence with daily activity.   Baseline:  Goal status: Not met  3.  Pt. Will decrease 5xSTS to <30 sec. To improve functional mobility/ decrease fall risk.  Baseline: 58sec. From 23" blue mat table (challenged).  10/16: gray chair with UE assist: 19.7 seconds Goal status: Partially met   PLAN: PT FREQUENCY: 1-2x/week  PT DURATION: 12 weeks  PLANNED INTERVENTIONS: Therapeutic exercises, Therapeutic activity, Neuromuscular re-education, Balance training, Gait training, Patient/Family education, Self Care, Joint mobilization, Stair training, Aquatic Therapy, Moist heat, Manual therapy, and Re-evaluation.  PLAN FOR NEXT SESSION: Check [employer] authorization  Pura Spice, PT, DPT # [id number] 02/03/2022, 6:39 PM

## 2022-02-06 ENCOUNTER — Encounter: Payer: Self-pay | Admitting: Physical Therapy

## 2022-02-06 ENCOUNTER — Ambulatory Visit: Payer: Medicare HMO | Admitting: Physical Therapy

## 2022-02-06 DIAGNOSIS — R2689 Other abnormalities of gait and mobility: Secondary | ICD-10-CM

## 2022-02-06 DIAGNOSIS — R269 Unspecified abnormalities of gait and mobility: Secondary | ICD-10-CM | POA: Diagnosis not present

## 2022-02-06 DIAGNOSIS — R293 Abnormal posture: Secondary | ICD-10-CM

## 2022-02-06 DIAGNOSIS — M5459 Other low back pain: Secondary | ICD-10-CM | POA: Diagnosis not present

## 2022-02-06 DIAGNOSIS — M6281 Muscle weakness (generalized): Secondary | ICD-10-CM

## 2022-02-06 NOTE — Therapy (Signed)
OUTPATIENT PHYSICAL THERAPY THORACOLUMBAR TREATMENT   Patient Name: Hannah Powell MRN: [id number] DOB:26-Jan-1946, 76 y.o., female Today's Date: 02/06/2022   PT End of Session -     Visit Number 9 of 19   04/20/2022   PT Start Time 1027 to 1114  (9 minutes)   Activity Tolerance Patient tolerated treatment well    Behavior During Therapy WFL for tasks assessed/performed          Past Medical History:  Diagnosis Date   Anxiety    Arthritis    CHF (congestive heart failure) (HCC)    COPD (chronic obstructive pulmonary disease) ([place])    Dysplastic nevus 04/22/2019   R abdomen parallel to sup umbilicus - mod   Dysplastic nevus 04/22/2019   R low back above waistline - mild    Edema    feet/legs   Hypertension    dr Otho Najjar      Hypothyroidism    Leg weakness, bilateral    Osteoporosis    Oxygen deficit    2l with bipap at night   RLS (restless legs syndrome)    Shortness of breath    Sleep apnea    bipap   Wheezing    Past Surgical History:  Procedure Laterality Date   BACK SURGERY     cervical   BREAST BIOPSY Left    needle bx-neg   CATARACT EXTRACTION W/PHACO Left 05/07/2017   Procedure: CATARACT EXTRACTION PHACO AND INTRAOCULAR LENS PLACEMENT ([place]);  Surgeon: Birder Robson, MD;  Location: [facility];  Service: Ophthalmology;  Laterality: Left;  [place] 00:48.7 AP% 11.3 CDE 5.46 Fluid Pack Lot # [id number]   CATARACT EXTRACTION W/PHACO Right 04/23/2017   Procedure: CATARACT EXTRACTION PHACO AND INTRAOCULAR LENS PLACEMENT ([facility]);  Surgeon: Birder Robson, MD;  Location: [facility];  Service: Ophthalmology;  Laterality: Right;  [place] 00:29 AP% 12.8 CDE 3.79 FLUID PACK LOT # [id number]   FOOT ARTHROPLASTY     JOINT REPLACEMENT     x2 tkr   TOTAL SHOULDER ARTHROPLASTY Left 07/02/2013   Procedure: LEFT TOTAL SHOULDER ARTHROPLASTY;  Surgeon: Marin Shutter, MD;  Location: [facility];  Service: Orthopedics;  Laterality: Left;   TOTAL SHOULDER ARTHROPLASTY Right 04/10/2018    Procedure: TOTAL REVERSE SHOULDER ARTHROPLASTY;  Surgeon: Justice Britain, MD;  Location: [facility];  Service: Orthopedics;  Laterality: Right;  12mn   Patient Active Problem List   Diagnosis Date Noted   S/P reverse total shoulder arthroplasty, 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   [place] 08/09/2016   COPD (chronic obstructive pulmonary disease) ([place] 08/09/2016   Pulmonary infiltrates    Pneumonia due to Streptococcus pneumoniae ([place]    Fever    Community acquired pneumonia of right lung    Palliative care by specialist    Goals of care, counseling/discussion    Pressure injury of skin 07/03/2016   COPD exacerbation ([place] 07/21/2015   Acute renal insufficiency 07/21/2015   Hyperglycemia 07/21/2015   Benign essential HTN 07/21/2015   S/P shoulder replacement 07/02/2013    PCP: KAdin Hector MD  REFERRING PROVIDER: KAdin Hector MD  REFERRING DIAG: Low back pain  Rationale for Evaluation and Treatment Rehabilitation  THERAPY DIAG:  Other low back pain  Muscle weakness (generalized)  Gait difficulty  Abnormal posture  Imbalance  ONSET DATE: 03/20/2019  SUBJECTIVE:  SUBJECTIVE STATEMENT:  EVALUATION Pt. Reports chronic h/o lumbar stenosis/ back pain.  Pt. Reports 8/10 low back pain in morning and uses heating pad/ ibuprofen to decrease back pain to 1/10 after about 2 hours.  Pt. Has returned to [employer] and aquatic ex. Recently.    PERTINENT HISTORY:  See MD notes.    PAIN:  Are you having pain? Yes: NPRS scale: 1-2/10 Pain location: low back Pain description: aching/ stiff Aggravating factors: prolonged activity Relieving factors: heat/ ibuprofen   PRECAUTIONS: Fall  WEIGHT BEARING RESTRICTIONS No  FALLS:  Has patient fallen in last 6 months?  No  LIVING ENVIRONMENT: Lives with: lives with their spouse Lives in: House/apartment  OCCUPATION: Retired  PLOF: Independent with household mobility with device  PATIENT GOALS Increase ability to stand from chair/ improve upright posture/ decrease low back pain.     OBJECTIVE:   PATIENT SURVEYS:  FOTO initial 57/ goal 13  SCREENING FOR RED FLAGS: Bowel or bladder incontinence: No Spinal tumors: No Cauda equina syndrome: No Compression fracture: No Abdominal aneurysm: No  COGNITION:  Overall cognitive status: Within functional limits for tasks assessed   SENSATION: Not tested  POSTURE: rounded shoulders, forward head, and increased thoracic kyphosis  PALPATION: Tenderness over lumbar paraspinals.   LUMBAR ROM:   Active  A/PROM  eval  Flexion 50% limited  Extension 75%  Right lateral flexion 50%  Left lateral flexion 50%  Right rotation 50%  Left rotation 50%   (Blank rows = not tested)  LOWER EXTREMITY ROM:     B hip flexion/ knee extension limited.    LOWER EXTREMITY MMT:    B LE muscle strength grossly 4/5 MMT except hamstring 4+/5 MMT  5xSTS: 58sec. From 23" blue mat table (challenged).    Ascends steps with L LE and heavy UE assist.   GAIT: Distance walked: In clinic Assistive device utilized: Walker - 2 wheeled  Elevated forearm support Level of assistance: Modified independence Comments: Significant forward head/ flexed posture/ thoracic kyphosis   TODAY'S TREATMENT    02/06/22:   Subjective:  Pt. Arrived to PT and reports recent episodes of SOB.  Pt. Went to engagement party with use of RW and w/c as needed.     There.ex:    Nustep L4 10 min. B UE/LE.  Discussed upcoming Thanksgiving events.  O2 sat: 94%, HR: 102 bpm.     Seated on blue mat table (elevated):  heel raises 20x/ toe raises 20x/ sit to stands without UE assist 2x (difficulty/ extra time required)/ marching 20x/ LAQ 20x.  O2 sat: 95% sat.   Nautilus: seated lat. Pull  downs 50#/ standing tricep extension 30x each.  Chair behind pt. For safety.     Seated 2# bicep curls/ punches 20x.    Walking in //-bars: forward with added 3" plinth step overs 2 laps. (Fatigue).     Walking from clinic out to car with RW and cuing to increase upright posture/ head position.  No LOB/ cuing to correct head/ upright posture. Moderate generalized fatigue noted during tx. Session, esp. With sit to stands.      Discussed HEP   PATIENT EDUCATION:  Education details: HEP/ upright posture/ Sit stands Person educated: Patient Education method: Customer service manager Education comprehension: verbalized understanding and returned demonstration   HOME EXERCISE PROGRAM: Exercises at [employer] use of recumbent bike.    ASSESSMENT:  CLINICAL IMPRESSION: Pt. Continues to require heavy UE assist in //-bars and with RW for all mobility  tasks.  Pt focused on maintaining pt. BOS and decreasing use of UE with standing/ wt. Shifting. No LOB during tx and no c/o low back symptoms. Pt. Fatigues easily with prolonged standing/ walking in //-bars with increase step pattern.  Pt. Will benefit from skilled PT services in combination with gym based ex. To improve LE strengthening/ pain-free mobility.     OBJECTIVE IMPAIRMENTS Abnormal gait, cardiopulmonary status limiting activity, decreased activity tolerance, decreased balance, decreased coordination, decreased endurance, decreased mobility, difficulty walking, decreased ROM, decreased strength, impaired flexibility, improper body mechanics, postural dysfunction, and pain.   ACTIVITY LIMITATIONS carrying, lifting, bending, standing, squatting, transfers, bed mobility, bathing, toileting, dressing, and locomotion level  PARTICIPATION LIMITATIONS: meal prep, cleaning, laundry, driving, shopping, and community activity  PERSONAL FACTORS Age, Fitness, Past/current experiences, and 3+ comorbidities:      REHAB POTENTIAL:  Good  CLINICAL DECISION MAKING: Evolving/moderate complexity  EVALUATION COMPLEXITY: Moderate   GOALS: Goals reviewed with patient? Yes  SHORT TERM GOALS: Target date: 02/23/22  Pt. Will be independent with HEP to increase LE muscle strength 1/2 muscle grade to improve transfers/ safety with gait.  Baseline:  see above.  11/10: B hip strength grossly 4/5 MMT and quad/hamstring 4+/5 MMT Goal status: Partially met   LONG TERM GOALS: Target date: 04/20/22  Pt. Will increase FOTO to 63 to improve pain-free mobility.   Baseline: initial FOTO 57.  10/16: 54 (no improvement)- limited with mobility.  Goal status: Not met  2.  Pt. Able to stand from 21# chair height with no UE assist to improve transfers/ independence with daily activity.   Baseline:  Goal status: Not met  3.  Pt. Will decrease 5xSTS to <30 sec. To improve functional mobility/ decrease fall risk.  Baseline: 58sec. From 23" blue mat table (challenged).  10/16: gray chair with UE assist: 19.7 seconds Goal status: Partially met   PLAN: PT FREQUENCY: 1-2x/week  PT DURATION: 12 weeks  PLANNED INTERVENTIONS: Therapeutic exercises, Therapeutic activity, Neuromuscular re-education, Balance training, Gait training, Patient/Family education, Self Care, Joint mobilization, Stair training, Aquatic Therapy, Moist heat, Manual therapy, and Re-evaluation.  PLAN FOR NEXT SESSION: Check [employer] authorization  Pura Spice, PT, DPT # [id number] 02/06/2022, 11:14 AM

## 2022-02-08 ENCOUNTER — Emergency Department: Payer: Medicare HMO

## 2022-02-08 ENCOUNTER — Other Ambulatory Visit: Payer: Self-pay

## 2022-02-08 ENCOUNTER — Encounter: Payer: Self-pay | Admitting: Emergency Medicine

## 2022-02-08 ENCOUNTER — Observation Stay
Admission: EM | Admit: 2022-02-08 | Discharge: 2022-02-09 | Disposition: A | Discharge status: Home / Self Care | Payer: Medicare HMO | Attending: Internal Medicine | Admitting: Internal Medicine

## 2022-02-08 DIAGNOSIS — Z96651 Presence of right artificial knee joint: Secondary | ICD-10-CM | POA: Diagnosis not present

## 2022-02-08 DIAGNOSIS — Z1152 Encounter for screening for COVID-19: Secondary | ICD-10-CM | POA: Diagnosis not present

## 2022-02-08 DIAGNOSIS — Z79899 Other long term (current) drug therapy: Secondary | ICD-10-CM | POA: Diagnosis not present

## 2022-02-08 DIAGNOSIS — Z96612 Presence of left artificial shoulder joint: Secondary | ICD-10-CM | POA: Diagnosis not present

## 2022-02-08 DIAGNOSIS — R0602 Shortness of breath: Secondary | ICD-10-CM | POA: Diagnosis not present

## 2022-02-08 DIAGNOSIS — I5032 Chronic diastolic (congestive) heart failure: Secondary | ICD-10-CM | POA: Diagnosis not present

## 2022-02-08 DIAGNOSIS — Z87891 Personal history of nicotine dependence: Secondary | ICD-10-CM | POA: Diagnosis not present

## 2022-02-08 DIAGNOSIS — E039 Hypothyroidism, unspecified: Secondary | ICD-10-CM | POA: Insufficient documentation

## 2022-02-08 DIAGNOSIS — I10 Essential (primary) hypertension: Secondary | ICD-10-CM | POA: Diagnosis present

## 2022-02-08 DIAGNOSIS — G4700 Insomnia, unspecified: Secondary | ICD-10-CM | POA: Insufficient documentation

## 2022-02-08 DIAGNOSIS — R0902 Hypoxemia: Secondary | ICD-10-CM | POA: Diagnosis not present

## 2022-02-08 DIAGNOSIS — E669 Obesity, unspecified: Secondary | ICD-10-CM | POA: Insufficient documentation

## 2022-02-08 DIAGNOSIS — Z7951 Long term (current) use of inhaled steroids: Secondary | ICD-10-CM | POA: Diagnosis not present

## 2022-02-08 DIAGNOSIS — Z96611 Presence of right artificial shoulder joint: Secondary | ICD-10-CM | POA: Insufficient documentation

## 2022-02-08 DIAGNOSIS — G2581 Restless legs syndrome: Secondary | ICD-10-CM | POA: Diagnosis not present

## 2022-02-08 DIAGNOSIS — J9601 Acute respiratory failure with hypoxia: Secondary | ICD-10-CM | POA: Diagnosis present

## 2022-02-08 DIAGNOSIS — R059 Cough, unspecified: Secondary | ICD-10-CM | POA: Diagnosis not present

## 2022-02-08 DIAGNOSIS — J441 Chronic obstructive pulmonary disease with (acute) exacerbation: Principal | ICD-10-CM | POA: Diagnosis present

## 2022-02-08 DIAGNOSIS — I110 Hypertensive heart disease with heart failure: Secondary | ICD-10-CM | POA: Diagnosis not present

## 2022-02-08 DIAGNOSIS — F419 Anxiety disorder, unspecified: Secondary | ICD-10-CM | POA: Insufficient documentation

## 2022-02-08 DIAGNOSIS — R630 Anorexia: Secondary | ICD-10-CM | POA: Insufficient documentation

## 2022-02-08 LAB — BASIC METABOLIC PANEL
Anion gap: 9 (ref 5–15)
BUN: 34 mg/dL — ABNORMAL HIGH (ref 8–23)
CO2: 28 mmol/L (ref 22–32)
Calcium: 9.5 mg/dL (ref 8.9–10.3)
Chloride: 106 mmol/L (ref 98–111)
Creatinine, Ser: 1 mg/dL (ref 0.44–1.00)
GFR, Estimated: 59 mL/min — ABNORMAL LOW (ref >60)
Glucose, Bld: 118 mg/dL — ABNORMAL HIGH (ref 70–99)
Potassium: 3.7 mmol/L (ref 3.5–5.1)
Sodium: 143 mmol/L (ref 135–145)

## 2022-02-08 LAB — TROPONIN I (HIGH SENSITIVITY)
Troponin I (High Sensitivity): 3 ng/L (ref <18)
Troponin I (High Sensitivity): 3 ng/L (ref <18)

## 2022-02-08 LAB — BRAIN NATRIURETIC PEPTIDE: B Natriuretic Peptide: 27.3 pg/mL (ref 0.0–100.0)

## 2022-02-08 LAB — RESP PANEL BY RT-PCR (FLU A&B, COVID) ARPGX2
Influenza A by PCR: NEGATIVE
Influenza B by PCR: NEGATIVE
SARS Coronavirus 2 by RT PCR: NEGATIVE

## 2022-02-08 LAB — LACTIC ACID, PLASMA: Lactic Acid, Venous: 1 mmol/L (ref 0.5–1.9)

## 2022-02-08 MED ORDER — ENOXAPARIN SODIUM 60 MG/0.6ML IJ SOSY
0.5000 mg/kg | PREFILLED_SYRINGE | INTRAMUSCULAR | Status: DC
  Administered 2022-02-08: 50 mg via SUBCUTANEOUS
  Filled 2022-02-08: qty 0.6

## 2022-02-08 MED ORDER — ONDANSETRON HCL 4 MG PO TABS: 4.0000 mg | ORAL_TABLET | Freq: Four times a day (QID) | ORAL | Status: DC | PRN

## 2022-02-08 MED ORDER — FLUTICASONE FUROATE-VILANTEROL 100-25 MCG/ACT IN AEPB
1.0000 | INHALATION_SPRAY | Freq: Every day | RESPIRATORY_TRACT | Status: DC
  Administered 2022-02-09: 1 via RESPIRATORY_TRACT
  Filled 2022-02-08 (×2): qty 28

## 2022-02-08 MED ORDER — ALPRAZOLAM 0.25 MG PO TABS
0.2500 mg | ORAL_TABLET | Freq: Three times a day (TID) | ORAL | Status: DC | PRN
  Administered 2022-02-08: 0.25 mg via ORAL
  Filled 2022-02-08: qty 1

## 2022-02-08 MED ORDER — SODIUM CHLORIDE 0.9 % IV SOLN
500.0000 mg | Freq: Once | INTRAVENOUS | Status: AC
  Administered 2022-02-08: 500 mg via INTRAVENOUS
  Filled 2022-02-08: qty 5

## 2022-02-08 MED ORDER — SODIUM CHLORIDE 0.9 % IV SOLN
1.0000 g | Freq: Once | INTRAVENOUS | Status: AC
  Administered 2022-02-08: 1 g via INTRAVENOUS
  Filled 2022-02-08: qty 10

## 2022-02-08 MED ORDER — LEVOTHYROXINE SODIUM 50 MCG PO TABS
150.0000 ug | ORAL_TABLET | Freq: Every day | ORAL | Status: DC
  Administered 2022-02-09: 150 ug via ORAL
  Filled 2022-02-08: qty 1

## 2022-02-08 MED ORDER — FERROUS SULFATE 325 (65 FE) MG PO TABS
325.0000 mg | ORAL_TABLET | Freq: Every day | ORAL | Status: DC
  Administered 2022-02-09: 325 mg via ORAL
  Filled 2022-02-08: qty 1

## 2022-02-08 MED ORDER — SODIUM CHLORIDE 0.9 % IV SOLN
500.0000 mg | INTRAVENOUS | Status: DC
  Administered 2022-02-09: 500 mg via INTRAVENOUS
  Filled 2022-02-08: qty 500

## 2022-02-08 MED ORDER — METHYLPREDNISOLONE SODIUM SUCC 40 MG IJ SOLR
40.0000 mg | Freq: Every day | INTRAMUSCULAR | Status: DC
  Administered 2022-02-08 – 2022-02-09 (×2): 40 mg via INTRAVENOUS
  Filled 2022-02-08 (×2): qty 1

## 2022-02-08 MED ORDER — SODIUM CHLORIDE 0.9% FLUSH
3.0000 mL | Freq: Two times a day (BID) | INTRAVENOUS | Status: DC
  Administered 2022-02-08 – 2022-02-09 (×2): 3 mL via INTRAVENOUS

## 2022-02-08 MED ORDER — LOSARTAN POTASSIUM 25 MG PO TABS
25.0000 mg | ORAL_TABLET | Freq: Every day | ORAL | Status: DC
  Administered 2022-02-08 – 2022-02-09 (×2): 25 mg via ORAL
  Filled 2022-02-08 (×2): qty 1

## 2022-02-08 MED ORDER — PRAMIPEXOLE DIHYDROCHLORIDE 1 MG PO TABS
4.0000 mg | ORAL_TABLET | Freq: Every day | ORAL | Status: DC
  Administered 2022-02-08: 4 mg via ORAL
  Filled 2022-02-08 (×2): qty 4

## 2022-02-08 MED ORDER — FUROSEMIDE 40 MG PO TABS
80.0000 mg | ORAL_TABLET | Freq: Every day | ORAL | Status: DC
  Administered 2022-02-08 – 2022-02-09 (×2): 80 mg via ORAL
  Filled 2022-02-08 (×2): qty 2

## 2022-02-08 MED ORDER — ONDANSETRON HCL 4 MG/2ML IJ SOLN: 4.0000 mg | Freq: Four times a day (QID) | INTRAMUSCULAR | Status: DC | PRN

## 2022-02-08 MED ORDER — ZOLPIDEM TARTRATE 5 MG PO TABS
5.0000 mg | ORAL_TABLET | Freq: Every evening | ORAL | Status: DC | PRN
  Administered 2022-02-08: 5 mg via ORAL
  Filled 2022-02-08: qty 1

## 2022-02-08 MED ORDER — ACETAMINOPHEN 650 MG RE SUPP: 650.0000 mg | Freq: Four times a day (QID) | RECTAL | Status: DC | PRN

## 2022-02-08 MED ORDER — IPRATROPIUM-ALBUTEROL 0.5-2.5 (3) MG/3ML IN SOLN
3.0000 mL | Freq: Four times a day (QID) | RESPIRATORY_TRACT | Status: DC | PRN
  Filled 2022-02-08: qty 3

## 2022-02-08 MED ORDER — ACETAMINOPHEN 325 MG PO TABS: 650.0000 mg | ORAL_TABLET | Freq: Four times a day (QID) | ORAL | Status: DC | PRN

## 2022-02-08 MED ORDER — IPRATROPIUM-ALBUTEROL 0.5-2.5 (3) MG/3ML IN SOLN
3.0000 mL | RESPIRATORY_TRACT | Status: AC | PRN
  Administered 2022-02-08 (×3): 3 mL via RESPIRATORY_TRACT
  Filled 2022-02-08: qty 9

## 2022-02-08 MED ORDER — POLYETHYLENE GLYCOL 3350 17 G PO PACK: 17.0000 g | PACK | Freq: Every day | ORAL | Status: DC | PRN

## 2022-02-08 MED ORDER — MORPHINE SULFATE (PF) 2 MG/ML IV SOLN: 1.0000 mg | INTRAVENOUS | Status: DC | PRN

## 2022-02-08 MED ORDER — METHYLPREDNISOLONE SODIUM SUCC 125 MG IJ SOLR: 125.0000 mg | Freq: Once | INTRAMUSCULAR | Status: DC

## 2022-02-08 MED ORDER — TOPIRAMATE 100 MG PO TABS
100.0000 mg | ORAL_TABLET | Freq: Two times a day (BID) | ORAL | Status: DC
  Administered 2022-02-08 – 2022-02-09 (×3): 100 mg via ORAL
  Filled 2022-02-08 (×2): qty 1
  Filled 2022-02-08: qty 4

## 2022-02-08 MED ORDER — MODAFINIL 100 MG PO TABS
100.0000 mg | ORAL_TABLET | Freq: Every day | ORAL | Status: DC
  Administered 2022-02-08 – 2022-02-09 (×2): 100 mg via ORAL
  Filled 2022-02-08 (×2): qty 1

## 2022-02-08 MED ORDER — OXYCODONE HCL 5 MG PO TABS: 5.0000 mg | ORAL_TABLET | ORAL | Status: DC | PRN

## 2022-02-08 NOTE — ED Triage Notes (Signed)
Per EMS pt coming from home c/o cold symptoms over past few days with productive cough of yellow mucus. Woke up this am with shortness of breath. Room air sat of 85%. Given albuterol and duoneb with EMS. 125 solumedrol given PTA. Patient has oxygen at home for PRN. Also reports legs more swollen than normal.

## 2022-02-08 NOTE — ED Provider Notes (Addendum)
[facility] Provider Note    Event Date/Time   First MD Initiated Contact with Patient 02/08/22 0830     (approximate)   History   Shortness of Breath   HPI  Hannah Powell is a 76 y.o. female with past medical history significant for CHF, COPD, hypertension, hypothyroidism, OSA on nighttime BiPAP, who presents to the emergency department with shortness of breath.  Woke up this morning with significant shortness of breath.  Tried her home inhalers but continued to feel short of breath.  Checked her oxygen and it was 82%.  Normally wears BiPAP at nighttime only.  Does not normally wear oxygen during the day.  Does endorse 1 week of a productive cough with yellow sputum.  Denies any fever or chills.  No chest pain.  No recent hospitalizations.  On antibiotics for pneumonia at the beginning of November.  Denies history of DVT or PE.  Received IV Solu-Medrol with EMS     Physical Exam   Triage Vital Signs: ED Triage Vitals  Enc Vitals Group     BP      Pulse      Resp      Temp      Temp src      SpO2      Weight      Height      Head Circumference      Peak Flow      Pain Score      Pain Loc      Pain Edu?      Excl. in [place]?     Most recent vital signs: Vitals:   02/08/22 0930 02/08/22 1000  BP: 125/68 127/74  Pulse: 73 80  Resp: (!) 23 (!) 24  Temp:    SpO2: 99% 97%    Physical Exam Constitutional:      Appearance: She is well-developed.  HENT:     Head: Atraumatic.  Eyes:     Conjunctiva/sclera: Conjunctivae normal.  Cardiovascular:     Rate and Rhythm: Regular rhythm.  Pulmonary:     Effort: Respiratory distress present.     Breath sounds: Wheezing present.     Comments: 2 L nasal cannula, tachypneic with diffuse inspiratory and expiratory wheezing throughout all lung fields Abdominal:     General: There is no distension.  Musculoskeletal:        General: Normal range of motion.     Cervical back: Normal range of motion.      Right lower leg: No edema.     Left lower leg: No edema.  Skin:    General: Skin is warm.  Neurological:     Mental Status: She is alert. Mental status is at baseline.          IMPRESSION / MDM / ASSESSMENT AND PLAN / ED COURSE  I reviewed the triage vital signs and the nursing notes.  76 year old with past medical history significant for COPD, BiPAP at nighttime only, who presents to the emergency department for shortness of breath and hypoxia.  82% at home.  Received DuoNeb treatments and IV Solu-Medrol prior to arrival.  Requiring 2 L nasal cannula.  Differential diagnosis including COPD exacerbation, pneumonia, COVID, heart failure exacerbation, PE, ACS    EKG  No tachycardic or bradycardic dysrhythmias while on cardiac telemetry.  RADIOLOGY I independently reviewed imaging, my interpretation of imaging: Chest x-ray without focal findings consistent with pneumonia  Read as chronic interstitial findings.  Low volumes.  Possible tiny effusions.    ED Results / Procedures / Treatments   Labs (all labs ordered are listed, but only abnormal results are displayed) Labs interpreted as -  COVID and influenza testing are negative.  No significant anemia.  Lactic acid normal.  No significant leukocytosis.  Troponin negative doubt ACS.  Labs Reviewed  BASIC METABOLIC PANEL - Abnormal; Notable for the following components:      Result Value   Glucose, Bld 118 (*)    BUN 34 (*)    GFR, Estimated 59 (*)    All other components within normal limits  RESP PANEL BY RT-PCR (FLU A&B, COVID) ARPGX2  BRAIN NATRIURETIC PEPTIDE  LACTIC ACID, PLASMA  HIV ANTIBODY (ROUTINE TESTING W REFLEX)  TROPONIN I (HIGH SENSITIVITY)  TROPONIN I (HIGH SENSITIVITY)    Patient admitted for acute hypoxic respiratory failure in the setting of a COPD exacerbation.  Given her productive cough and acute hypoxia patient started on IV Rocephin and azithromycin to cover for community-acquired  pneumonia.  Consulted hospitalist for admission.   PROCEDURES:  Critical Care performed: Yes  .Critical Care  Performed by: Nathaniel Man, MD Authorized by: Nathaniel Man, MD   Critical care provider statement:    Critical care time (minutes):  35   Critical care time was exclusive of:  Separately billable procedures and treating other patients   Critical care was necessary to treat or prevent imminent or life-threatening deterioration of the following conditions:  Respiratory failure   Critical care was time spent personally by me on the following activities:  Development of treatment plan with patient or surrogate, discussions with consultants, evaluation of patient's response to treatment, examination of patient, ordering and review of laboratory studies, ordering and review of radiographic studies, ordering and performing treatments and interventions, pulse oximetry, re-evaluation of patient's condition and review of old charts   Patient's presentation is most consistent with acute presentation with potential threat to life or bodily function.   MEDICATIONS ORDERED IN ED: Medications  sodium chloride flush (NS) 0.9 % injection 3 mL (3 mLs Intravenous Not Given 02/08/22 1033)  enoxaparin (LOVENOX) injection 50 mg (has no administration in time range)  polyethylene glycol (MIRALAX / GLYCOLAX) packet 17 g (has no administration in time range)  ondansetron (ZOFRAN) tablet 4 mg (has no administration in time range)    Or  ondansetron (ZOFRAN) injection 4 mg (has no administration in time range)  acetaminophen (TYLENOL) tablet 650 mg (has no administration in time range)    Or  acetaminophen (TYLENOL) suppository 650 mg (has no administration in time range)  oxyCODONE (Oxy IR/ROXICODONE) immediate release tablet 5 mg (has no administration in time range)  morphine (PF) 2 MG/ML injection 1 mg (has no administration in time range)  ipratropium-albuterol (DUONEB) 0.5-2.5 (3) MG/3ML  nebulizer solution 3 mL (has no administration in time range)  ALPRAZolam (XANAX) tablet 0.25 mg (has no administration in time range)  fluticasone furoate-vilanterol (BREO ELLIPTA) 100-25 MCG/ACT 1 puff (has no administration in time range)  ferrous sulfate tablet 325 mg (has no administration in time range)  furosemide (LASIX) tablet 80 mg (has no administration in time range)  levothyroxine (SYNTHROID) tablet 150 mcg (has no administration in time range)  losartan (COZAAR) tablet 25 mg (has no administration in time range)  topiramate (TOPAMAX) tablet 100 mg (has no administration in time range)  zolpidem (AMBIEN) tablet 5 mg (has no administration in time range)  pramipexole (MIRAPEX) tablet 4 mg (has no administration in time range)  modafinil (PROVIGIL) tablet 100 mg (has no administration in time range)  methylPREDNISolone sodium succinate (SOLU-MEDROL) 40 mg/mL injection 40 mg (has no administration in time range)  azithromycin (ZITHROMAX) 500 mg in sodium chloride 0.9 % 250 mL IVPB (has no administration in time range)  ipratropium-albuterol (DUONEB) 0.5-2.5 (3) MG/3ML nebulizer solution 3 mL (3 mLs Nebulization Given 02/08/22 1003)  cefTRIAXone (ROCEPHIN) 1 g in sodium chloride 0.9 % 100 mL IVPB (0 g Intravenous Stopped 02/08/22 1038)  azithromycin (ZITHROMAX) 500 mg in sodium chloride 0.9 % 250 mL IVPB (0 mg Intravenous Stopped 02/08/22 1145)    FINAL CLINICAL IMPRESSION(S) / ED DIAGNOSES   Final diagnoses:  Hypoxia  COPD exacerbation ([place])     Rx / DC Orders   ED Discharge Orders     None        Note:  This document was prepared using Dragon voice recognition software and may include unintentional dictation errors.   Nathaniel Man, MD 02/08/22 1003    Nathaniel Man, MD 02/08/22 1434

## 2022-02-08 NOTE — ED Notes (Signed)
Patient transported to X-ray 

## 2022-02-08 NOTE — ED Notes (Signed)
Patient denies pain and is resting comfortably.  

## 2022-02-08 NOTE — H&P (Signed)
History and Physical    Hannah Powell LFY:[id number] DOB: [birth year] DOA: 02/08/2022  PCP: Adin Hector, MD Patient coming from: home   Chief Complaint: shortness of breath   HPI: 76 y/o F w/ PMH of COPD, CHF, HTN, hypothyroidism, obesity, RLS, insomnia, anxiety, IDA who presents w/ shortness of breath x day of admission. Pt c/o shortness of breath at rest as well as with exertion. The shortness of breath is worse w/ exertion. Pt tried using a inhaler and giving herself a nebulizer treatment w/o any improvement in her shortness of breath. Pt denies any fever, chills, sweating, chest pain, vomiting, abd pain, dysuria, urinary urgency, urinary frequency, diarrhea or constipation. Of note, pt c/o productive cough w/ thin yellow mucus x 1 week. Pt also c/o intermittent nausea but denies vomiting. Pt does have a previous smoking hx but quit smoking in 1977. Pt uses a walker and wheelchair at home. Pt lives w/ husband at home and has 2 adult children.    Review of Systems: As per HPI otherwise 14 point review of systems negative.    Past Medical History:  Diagnosis Date   Anxiety    Arthritis    CHF (congestive heart failure) (HCC)    COPD (chronic obstructive pulmonary disease) (HCC)    Dysplastic nevus 04/22/2019   R abdomen parallel to sup umbilicus - mod   Dysplastic nevus 04/22/2019   R low back above waistline - mild    Edema    feet/legs   Hypertension    dr Otho Najjar      Hypothyroidism    Leg weakness, bilateral    Osteoporosis    Oxygen deficit    2l with bipap at night   RLS (restless legs syndrome)    Shortness of breath    Sleep apnea    bipap   Wheezing     Past Surgical History:  Procedure Laterality Date   BACK SURGERY     cervical   BREAST BIOPSY Left    needle bx-neg   CATARACT EXTRACTION W/PHACO Left 05/07/2017   Procedure: CATARACT EXTRACTION PHACO AND INTRAOCULAR LENS PLACEMENT ([employer]);  Surgeon: Birder Robson, MD;  Location: [facility];   Service: Ophthalmology;  Laterality: Left;  [place] 00:48.7 AP% 11.3 CDE 5.46 Fluid Pack Lot # [id number]   CATARACT EXTRACTION W/PHACO Right 04/23/2017   Procedure: CATARACT EXTRACTION PHACO AND INTRAOCULAR LENS PLACEMENT ([facility]);  Surgeon: Birder Robson, MD;  Location: [facility];  Service: Ophthalmology;  Laterality: Right;  [place] 00:29 AP% 12.8 CDE 3.79 FLUID PACK LOT # [id number]   FOOT ARTHROPLASTY     JOINT REPLACEMENT     x2 tkr   TOTAL SHOULDER ARTHROPLASTY Left 07/02/2013   Procedure: LEFT TOTAL SHOULDER ARTHROPLASTY;  Surgeon: Marin Shutter, MD;  Location: [facility];  Service: Orthopedics;  Laterality: Left;   TOTAL SHOULDER ARTHROPLASTY Right 04/10/2018   Procedure: TOTAL REVERSE SHOULDER ARTHROPLASTY;  Surgeon: Justice Britain, MD;  Location: [facility];  Service: Orthopedics;  Laterality: Right;  123mn     reports that she quit smoking about 46 years ago. She has a 10.00 pack-year smoking history. She has never used smokeless tobacco. She reports that she does not drink alcohol and does not use drugs.  Recently drinks a pina colada approx once a week. Pt denies any illicit drug use.   Allergies  Allergen Reactions   Lyrica [Pregabalin] Other (See Comments)    Reaction: made Restless Leg Syndrome worse     Family History  Problem Relation Age of Onset   Hypertension Mother    Diabetes Mother    Heart failure Father    Breast cancer Neg Hx      Prior to Admission medications   Medication Sig Start Date End Date Taking? Authorizing Provider  VENTOLIN HFA 108 (90 Base) MCG/ACT inhaler Inhale 2 puffs into the lungs every 6 (six) hours as needed. 11/30/21  Yes [provider]  albuterol (PROVENTIL) (2.5 MG/3ML) 0.083% nebulizer solution Take 2.5 mg by nebulization every 6 (six) hours as needed for wheezing or shortness of breath.    [provider]  ALPRAZolam Duanne Moron) 0.25 MG tablet Take 1 tablet (0.25 mg total) by mouth 3 (three) times daily as needed for anxiety. 08/03/16    Bettey Costa, MD  ALPRAZolam Duanne Moron) 0.25 MG tablet Take 1 tablet by mouth 3 (three) times daily as needed. Patient not taking: Reported on 12/05/2020 11/07/20   [provider]  amoxicillin (AMOXIL) 500 MG capsule amoxicillin 500 mg capsule Patient not taking: Reported on 12/05/2020 01/21/20   [provider]  azelastine (ASTELIN) 0.1 % nasal spray  09/16/20   [provider]  azithromycin (ZITHROMAX) 250 MG tablet TAKE 1 TABLET (250 MG TOTAL) BY MOUTH DAILY. Patient not taking: Reported on 02/08/2022 08/03/20   Flora Lipps, MD  B Complex-C (B-COMPLEX WITH VITAMIN C) tablet Take 1 tablet by mouth daily.     [provider]  calcium carbonate (OSCAL) 1500 (600 Ca) MG TABS tablet Take by mouth. 01/01/05   [provider]  Calcium Carbonate-Vitamin D (CALCIUM-VITAMIN D) 500-200 MG-UNIT per tablet Take 1 tablet by mouth daily.    [provider]  COMBIVENT RESPIMAT 20-100 MCG/ACT AERS respimat Inhale 1 puff into the lungs 4 (four) times daily.    [provider]  cyclobenzaprine (FLEXERIL) 10 MG tablet Take 10 mg by mouth 3 (three) times daily as needed for muscle spasms.  Patient not taking: Reported on 12/05/2020    [provider]  ferrous sulfate 325 (65 FE) MG tablet Take 325 mg by mouth daily with breakfast.    [provider]  fluticasone furoate-vilanterol (BREO ELLIPTA) 200-25 MCG/INH AEPB Inhale 1 puff into the lungs daily. Patient not taking: Reported on 12/05/2020 06/02/19   Flora Lipps, MD  fluticasone furoate-vilanterol (BREO ELLIPTA) 200-25 MCG/INH AEPB Inhale 1 puff into the lungs daily. 07/21/19   Flora Lipps, MD  fluticasone furoate-vilanterol (BREO ELLIPTA) 200-25 MCG/INH AEPB Inhale into the lungs. Patient not taking: Reported on 12/05/2020 08/21/16   [provider]  furosemide (LASIX) 20 MG tablet Take 2 tablets (40 mg total) by mouth 2 (two) times daily. Patient not taking: Reported on 12/05/2020  08/03/16   Bettey Costa, MD  furosemide (LASIX) 80 MG tablet Take by mouth. 08/08/20 08/08/21  [provider]  gabapentin (NEURONTIN) 300 MG capsule Take 300 mg by mouth at bedtime.  Patient not taking: Reported on 12/05/2020    [provider]  ibuprofen (ADVIL) 800 MG tablet Take by mouth. Patient not taking: Reported on 12/05/2020    [provider]  ibuprofen (ADVIL,MOTRIN) 200 MG tablet Take 800 mg by mouth every 6 (six) hours as needed for headache or mild pain.     [provider]  ipratropium-albuterol (DUONEB) 0.5-2.5 (3) MG/3ML SOLN Take 3 mLs by nebulization every 4 (four) hours as needed. 08/03/16   Bettey Costa, MD  ketoconazole (NIZORAL) 2 % cream Apply once daily under the breast and left under arm. 04/25/20  Ralene Bathe, MD  levalbuterol Penne Lash) 0.31 MG/3ML nebulizer solution Inhale into the lungs. 11/18/20 11/18/21  [provider]  levothyroxine (SYNTHROID) 112 MCG tablet Take 112 mcg by mouth daily before breakfast. Take 2 tabs po QD Patient not taking: Reported on 12/05/2020    [provider]  levothyroxine (SYNTHROID) 112 MCG tablet Take by mouth. 04/18/20 04/18/21  [provider]  levothyroxine (SYNTHROID, LEVOTHROID) 200 MCG tablet Take 150 mcg by mouth daily before breakfast.    [provider]  loratadine-pseudoephedrine (CLARITIN-D 24-HOUR) 10-240 MG 24 hr tablet Take 1 tablet by mouth daily as needed for allergies.    [provider]  losartan (COZAAR) 25 MG tablet Take 25 mg by mouth daily.    [provider]  losartan (COZAAR) 25 MG tablet Take 1 tablet by mouth daily. Patient not taking: Reported on 12/05/2020 10/19/20   [provider]  modafinil (PROVIGIL) 100 MG tablet Take 100 mg by mouth daily.    [provider]  Multiple Vitamins-Minerals (MULTIVITAMIN WOMEN PO) Take 1 tablet by mouth daily.    [provider]  Omega-3 Fatty Acids (OMEGA 3 PO) Take 1  capsule by mouth daily.    [provider]  oxybutynin (DITROPAN) 5 MG tablet Take 5 mg by mouth 3 (three) times daily. Patient not taking: Reported on 12/05/2020    [provider]  oxybutynin (DITROPAN) 5 MG tablet Take 1 tablet by mouth 3 (three) times daily. 02/18/20   [provider]  potassium chloride SA (K-DUR,KLOR-CON) 20 MEQ tablet Take 20 mEq by mouth 2 (two) times daily.  Patient not taking: Reported on 12/05/2020    [provider]  potassium chloride SA (KLOR-CON) 20 MEQ tablet Take 1 tablet by mouth 2 (two) times daily. 10/17/20   [provider]  pramipexole (MIRAPEX) 1 MG tablet Take 4 mg by mouth at bedtime.     [provider]  spironolactone (ALDACTONE) 25 MG tablet Take 1 tablet by mouth 2 (two) times daily. Patient not taking: Reported on 12/05/2020 10/05/20   [provider]  topiramate (TOPAMAX) 100 MG tablet Take 100 mg by mouth 2 (two) times daily.    [provider]  topiramate (TOPAMAX) 100 MG tablet Take by mouth. Patient not taking: Reported on 12/05/2020 05/02/20   [provider]  traMADol (ULTRAM) 50 MG tablet Take 1 tablet (50 mg total) by mouth every 8 (eight) hours as needed (for mild pain). 04/11/18   Shuford, Olivia Mackie, PA-C  traMADol (ULTRAM) 50 MG tablet Take 1 tablet by mouth every 8 (eight) hours as needed. Patient not taking: Reported on 12/05/2020 11/30/20   [provider]  zolpidem (AMBIEN) 10 MG tablet Take 5 mg by mouth at bedtime as needed for sleep.    [provider]    Physical Exam: Vitals:   02/08/22 0838 02/08/22 0915 02/08/22 0930 02/08/22 1000  BP: 124/84 (!) 110/52 125/68 127/74  Pulse:  77 73 80  Resp:  (!) 21 (!) 23 (!) 24  Temp:      TempSrc:      SpO2: 100% 97% 99% 97%  Weight:      Height:        Constitutional: NAD, calm, comfortable Vitals:   02/08/22 0838 02/08/22 0915 02/08/22 0930 02/08/22 1000  BP: 124/84 (!) 110/52 125/68 127/74   Pulse:  77 73 80  Resp:  (!) 21 (!) 23 (!) 24  Temp:      TempSrc:  SpO2: 100% 97% 99% 97%  Weight:      Height:       Eyes: PERRL, lids and conjunctivae normal ENMT: Mucous membranes are moist.  Neck: normal, supple Respiratory: decreased breath sounds b/l. Intermittent wheezes b/l.  No accessory muscle use.  Cardiovascular: Regular rate and rhythm,  No rubs / gallops. 2+ b/l LE pitting edema  Abdomen: soft, obese, no tenderness, bowel sounds positive.  Musculoskeletal: no clubbing / cyanosis.  Good ROM, no contractures.   Skin: no rashes, lesions, ulcers.  Neurologic: CN 2-12 grossly intact.  Psychiatric: Normal judgment and insight. Alert and oriented x 3. Normal mood.    Labs on Admission: I have personally reviewed following labs and imaging studies  CBC: No results for input(s): "WBC", "NEUTROABS", "HGB", "HCT", "MCV", "PLT" in the last 168 hours. Basic Metabolic Panel: Recent Labs  Lab 02/08/22 0841  NA 143  K 3.7  CL 106  CO2 28  GLUCOSE 118*  BUN 34*  CREATININE 1.00  CALCIUM 9.5   GFR: Estimated Creatinine Clearance: 57.4 mL/min (by C-G formula based on SCr of 1 mg/dL). Liver Function Tests: No results for input(s): "AST", "ALT", "ALKPHOS", "BILITOT", "PROT", "ALBUMIN" in the last 168 hours. No results for input(s): "LIPASE", "AMYLASE" in the last 168 hours. No results for input(s): "AMMONIA" in the last 168 hours. Coagulation Profile: No results for input(s): "INR", "PROTIME" in the last 168 hours. Cardiac Enzymes: No results for input(s): "CKTOTAL", "CKMB", "CKMBINDEX", "TROPONINI" in the last 168 hours. BNP (last 3 results) No results for input(s): "PROBNP" in the last 8760 hours. HbA1C: No results for input(s): "HGBA1C" in the last 72 hours. CBG: No results for input(s): "GLUCAP" in the last 168 hours. Lipid Profile: No results for input(s): "CHOL", "HDL", "LDLCALC", "TRIG", "CHOLHDL", "LDLDIRECT" in the last 72 hours. Thyroid Function  Tests: No results for input(s): "TSH", "T4TOTAL", "FREET4", "T3FREE", "THYROIDAB" in the last 72 hours. Anemia Panel: No results for input(s): "VITAMINB12", "FOLATE", "FERRITIN", "TIBC", "IRON", "RETICCTPCT" in the last 72 hours. Urine analysis:    Component Value Date/Time   COLORURINE AMBER (A) 08/01/2016 0651   APPEARANCEUR HAZY (A) 08/01/2016 0651   APPEARANCEUR Hazy 07/02/2014 0857   LABSPEC 1.015 08/01/2016 0651   LABSPEC 1.021 07/02/2014 0857   PHURINE 6.0 08/01/2016 0651   GLUCOSEU NEGATIVE 08/01/2016 0651   GLUCOSEU Negative 07/02/2014 0857   HGBUR NEGATIVE 08/01/2016 0651   BILIRUBINUR NEGATIVE 08/01/2016 0651   BILIRUBINUR Negative 07/02/2014 0857   KETONESUR NEGATIVE 08/01/2016 0651   PROTEINUR 30 (A) 08/01/2016 0651   NITRITE NEGATIVE 08/01/2016 0651   LEUKOCYTESUR NEGATIVE 08/01/2016 0651   LEUKOCYTESUR Negative 07/02/2014 0857    Radiological Exams on Admission: DG Chest 2 View  Result Date: 02/08/2022 CLINICAL DATA:  Shortness of breath. EXAM: CHEST - 2 VIEW COMPARISON:  08/11/2021 FINDINGS: AP film is markedly lordotic. The cardio pericardial silhouette is enlarged. Interstitial markings are diffusely coarsened with chronic features. Streaky opacity in the lower lung suggests atelectasis. Possible tiny effusions. Bones are demineralized. Bilateral shoulder replacement. Telemetry leads overlie the chest. Anterior cervical fusion plate again noted with displacement of the inferior-most screws, stable in the interval. IMPRESSION: 1. Low volume film with bibasilar atelectasis and possible tiny effusions. 2. Chronic interstitial coarsening. Electronically Signed   By: Misty Stanley M.D.   On: 02/08/2022 09:16    EKG: Independently reviewed.   Assessment/Plan Principal Problem:   COPD exacerbation (HCC) Active Problems:   HTN (hypertension)   Acute respiratory failure with hypoxia ([place])  Chronic diastolic heart failure (HCC)   Restless leg syndrome    Hypothyroidism   Obesity (BMI 30-39.9)   Insomnia   Anxiety   Poor appetite  COPD exacerbation: continue on IV azithromycin for its anti-inflammatory properties, started on IV steroids, bronchodilators & encourage incentive spirometry & flutter valve.   Acute hypoxic respiratory failure: likely secondary to above. Found to have 83% on RA at home. Continue on supplemental oxygen and wean as tolerated  IDA: continue on iron supplements  Hypothyroidism: continue on home dose of levothyroxine  HTN: continue on home dose of losartan  Likely chronic diastolic CHF: continue on home dose of lasix. Monitor I/Os. BNP is not elevated  RLS: continue on home dose of pramipexole  Anxiety & insomnia: severity unknown. Continue on home dose of xanax and ambien   Obesity: BMI 34.8. Complicates overall care & prognosis. Will check HbA1c level.   Poor appetite: continue on home dose of topamax    DVT prophylaxis: lovenox Code Status:  full  Family Communication: discussed pt's care w/ pt's family at bedside and answered their questions Disposition Plan: likely d/c back home  Consults called:  n/a Admission status:  observation    Wyvonnia Dusky MD [facility]   If 7PM-7AM, please contact night-coverage [email or web address]   02/08/2022, 1:24 PM

## 2022-02-08 NOTE — ED Notes (Signed)
ED Provider at bedside. 

## 2022-02-08 NOTE — Progress Notes (Signed)
PHARMACIST - PHYSICIAN COMMUNICATION  CONCERNING:  Enoxaparin (Lovenox) for DVT Prophylaxis    RECOMMENDATION: Patient was prescribed enoxaparin '40mg'$  q24 hours for VTE prophylaxis.   Filed Weights   02/08/22 0834  Weight: 98 kg (216 lb)    Body mass index is 34.86 kg/m.  Estimated Creatinine Clearance: 57.4 mL/min (by C-G formula based on SCr of 1 mg/dL).   Based on [place] patient is candidate for enoxaparin 0.'5mg'$ /kg TBW SQ every 24 hours based on BMI being >30.   DESCRIPTION: Pharmacy has adjusted enoxaparin dose per [facility] policy.  Patient is now receiving enoxaparin 0.5 mg/kg every 24 hours   Will M. Ouida Sills, PharmD PGY-1 Pharmacy Resident 02/08/2022 10:30 AM

## 2022-02-09 DIAGNOSIS — F419 Anxiety disorder, unspecified: Secondary | ICD-10-CM | POA: Diagnosis not present

## 2022-02-09 DIAGNOSIS — J9601 Acute respiratory failure with hypoxia: Secondary | ICD-10-CM | POA: Diagnosis not present

## 2022-02-09 DIAGNOSIS — I5032 Chronic diastolic (congestive) heart failure: Secondary | ICD-10-CM | POA: Diagnosis not present

## 2022-02-09 DIAGNOSIS — J441 Chronic obstructive pulmonary disease with (acute) exacerbation: Secondary | ICD-10-CM | POA: Diagnosis not present

## 2022-02-09 LAB — BASIC METABOLIC PANEL
Anion gap: 7 (ref 5–15)
BUN: 30 mg/dL — ABNORMAL HIGH (ref 8–23)
CO2: 32 mmol/L (ref 22–32)
Calcium: 8.6 mg/dL — ABNORMAL LOW (ref 8.9–10.3)
Chloride: 103 mmol/L (ref 98–111)
Creatinine, Ser: 0.81 mg/dL (ref 0.44–1.00)
GFR, Estimated: >60 mL/min (ref >60)
Glucose, Bld: 125 mg/dL — ABNORMAL HIGH (ref 70–99)
Potassium: 3.5 mmol/L (ref 3.5–5.1)
Sodium: 142 mmol/L (ref 135–145)

## 2022-02-09 LAB — CBC
HCT: 41.7 % (ref 36.0–46.0)
Hemoglobin: 13 g/dL (ref 12.0–15.0)
MCH: 31 pg (ref 26.0–34.0)
MCHC: 31.2 g/dL (ref 30.0–36.0)
MCV: 99.3 fL (ref 80.0–100.0)
Platelets: 181 10*3/uL (ref 150–400)
RBC: 4.2 MIL/uL (ref 3.87–5.11)
RDW: 13.7 % (ref 11.5–15.5)
WBC: 7.5 10*3/uL (ref 4.0–10.5)
nRBC: 0 % (ref 0.0–0.2)

## 2022-02-09 LAB — GLUCOSE, CAPILLARY
Glucose-Capillary: 92 mg/dL (ref 70–99)
Glucose-Capillary: 145 mg/dL — ABNORMAL HIGH (ref 70–99)

## 2022-02-09 LAB — HIV ANTIBODY (ROUTINE TESTING W REFLEX): HIV Screen 4th Generation wRfx: NONREACTIVE

## 2022-02-09 MED ORDER — PREDNISONE 10 MG (21) PO TBPK: ORAL_TABLET | ORAL | 0 refills | Status: DC

## 2022-02-09 MED ORDER — AZITHROMYCIN 500 MG PO TABS: 500.0000 mg | ORAL_TABLET | Freq: Every day | ORAL | 0 refills | Status: AC

## 2022-02-09 NOTE — Progress Notes (Signed)
  Transition of Care [facility]) Screening Note   Patient Details  Name: Hannah Powell Date of Birth: 1945/07/26   Transition of Care [facility]) CM/SW Contact:    Quin Hoop, LCSW Phone Number: 02/09/2022, 12:56 PM    Transition of Care Department [facility]) has reviewed patient and no TOC needs have been identified at this time. We will continue to monitor patient advancement through interdisciplinary progression rounds. If new patient transition needs arise, please place a TOC consult.  [place], [place]

## 2022-02-09 NOTE — Discharge Summary (Signed)
Physician Discharge Summary   Patient: Hannah Powell MRN: [id number] DOB: [birth year]  Admit date:     02/08/2022  Discharge date: 02/09/22  Discharge Physician: Max Sane   PCP: Adin Hector, MD   Recommendations at discharge:   Follow-up with outpatient providers as requested  Discharge Diagnoses: Principal Problem:   COPD exacerbation ([place]) Active Problems:   HTN (hypertension)   Acute respiratory failure with hypoxia (HCC)   Chronic diastolic heart failure (HCC)   Restless leg syndrome   Hypothyroidism   Obesity (BMI 30-39.9)   Insomnia   Anxiety   Poor appetite  Hospital Course: Assessment and Plan:  76 year old female with a known history of COPD on nocturnal 2 L oxygen chronically, CHF, hypertension, hypothyroidism, obesity, RLS, insomnia, anxiety, IDA was admitted for COPD exacerbation.  She responded very well to IV steroids, antibiotics, nebs and inhalers.  Initially she required 2.5 L oxygen which was weaned down to room air with oxygen saturation still above 89%.  She was ambulated and still maintain her oxygenation above 88%.  Patient preferred to go home and was discharged in stable condition.  She does have outpatient appointment with her PCP and her pulmonologist.  She was agreeable with the discharge plan.        Disposition: Home Diet recommendation:  Discharge Diet Orders (From admission, onward)     Start     Ordered   02/09/22 0000  Diet - low sodium heart healthy        02/09/22 1550           Carb modified diet DISCHARGE MEDICATION: Allergies as of 02/09/2022       Reactions   Lyrica [pregabalin] Other (See Comments)   Reaction: made Restless Leg Syndrome worse         Medication List     STOP taking these medications    amoxicillin 500 MG capsule Commonly known as: AMOXIL   calcium-vitamin D 500-200 MG-UNIT tablet   cyclobenzaprine 10 MG tablet Commonly known as: FLEXERIL   gabapentin 300 MG  capsule Commonly known as: NEURONTIN       TAKE these medications    albuterol (2.5 MG/3ML) 0.083% nebulizer solution Commonly known as: PROVENTIL Take 2.5 mg by nebulization every 6 (six) hours as needed for wheezing or shortness of breath.   Ventolin HFA 108 (90 Base) MCG/ACT inhaler Generic drug: albuterol Inhale 2 puffs into the lungs every 6 (six) hours as needed.   ALPRAZolam 0.25 MG tablet Commonly known as: XANAX Take 1 tablet (0.25 mg total) by mouth 3 (three) times daily as needed for anxiety. What changed: Another medication with the same name was removed. Continue taking this medication, and follow the directions you see here.   azelastine 0.1 % nasal spray Commonly known as: ASTELIN   azithromycin 500 MG tablet Commonly known as: Zithromax Take 1 tablet (500 mg total) by mouth daily for 3 days. Take 1 tablet daily for 3 days. What changed:  medication strength how much to take additional instructions   B-complex with vitamin C tablet Take 1 tablet by mouth daily.   Breo Ellipta 200-25 MCG/ACT Aepb Generic drug: fluticasone furoate-vilanterol Inhale 1 puff into the lungs daily. What changed: Another medication with the same name was removed. Continue taking this medication, and follow the directions you see here.   calcium carbonate 1500 (600 Ca) MG Tabs tablet Commonly known as: OSCAL Take by mouth.   ferrous sulfate 325 (65 FE) MG tablet Take  325 mg by mouth daily with breakfast.   furosemide 80 MG tablet Commonly known as: LASIX Take 80 mg by mouth 2 (two) times daily. What changed: Another medication with the same name was removed. Continue taking this medication, and follow the directions you see here.   ibuprofen 200 MG tablet Commonly known as: ADVIL Take 800 mg by mouth every 6 (six) hours as needed for headache or mild pain.   ibuprofen 800 MG tablet Commonly known as: ADVIL Take by mouth.   Combivent Respimat 20-100 MCG/ACT Aers  respimat Generic drug: Ipratropium-Albuterol Inhale 1 puff into the lungs 4 (four) times daily.   ipratropium-albuterol 0.5-2.5 (3) MG/3ML Soln Commonly known as: DUONEB Take 3 mLs by nebulization every 4 (four) hours as needed.   ketoconazole 2 % cream Commonly known as: NIZORAL Apply once daily under the breast and left under arm.   levalbuterol 0.31 MG/3ML nebulizer solution Commonly known as: XOPENEX Inhale into the lungs.   levothyroxine 200 MCG tablet Commonly known as: SYNTHROID Take 150 mcg by mouth daily before breakfast. What changed: Another medication with the same name was removed. Continue taking this medication, and follow the directions you see here.   levothyroxine 112 MCG tablet Commonly known as: SYNTHROID Take 150 mcg by mouth daily before breakfast. What changed: Another medication with the same name was removed. Continue taking this medication, and follow the directions you see here.   loratadine-pseudoephedrine 10-240 MG 24 hr tablet Commonly known as: CLARITIN-D 24-hour Take 1 tablet by mouth daily as needed for allergies.   losartan 25 MG tablet Commonly known as: COZAAR Take 25 mg by mouth daily. What changed: Another medication with the same name was removed. Continue taking this medication, and follow the directions you see here.   modafinil 100 MG tablet Commonly known as: PROVIGIL Take 100 mg by mouth daily.   MULTIVITAMIN WOMEN PO Take 1 tablet by mouth daily.   OMEGA 3 PO Take 1 capsule by mouth daily.   oxybutynin 5 MG tablet Commonly known as: DITROPAN Take 1 tablet by mouth 3 (three) times daily. What changed: Another medication with the same name was removed. Continue taking this medication, and follow the directions you see here.   potassium chloride SA 20 MEQ tablet Commonly known as: KLOR-CON M Take 20 mEq by mouth 2 (two) times daily.   potassium chloride SA 20 MEQ tablet Commonly known as: KLOR-CON M Take 1 tablet by  mouth 2 (two) times daily.   pramipexole 1 MG tablet Commonly known as: MIRAPEX Take 4 mg by mouth at bedtime.   predniSONE 10 MG (21) Tbpk tablet Commonly known as: STERAPRED UNI-PAK 21 TAB Start 60 mg po daily, taper 10 mg daily until finish   spironolactone 25 MG tablet Commonly known as: ALDACTONE Take 1 tablet by mouth 2 (two) times daily.   topiramate 100 MG tablet Commonly known as: TOPAMAX Take 100 mg by mouth daily. What changed: Another medication with the same name was removed. Continue taking this medication, and follow the directions you see here.   traMADol 50 MG tablet Commonly known as: ULTRAM Take 1 tablet (50 mg total) by mouth every 8 (eight) hours as needed (for mild pain). What changed: Another medication with the same name was removed. Continue taking this medication, and follow the directions you see here.   zolpidem 10 MG tablet Commonly known as: AMBIEN Take 5 mg by mouth at bedtime as needed for sleep.  Follow-up Information     Tama High III, MD. Schedule an appointment as soon as possible for a visit in 1 week(s).   Specialty: Internal Medicine Why: [facility] Discharge F/UP Contact information: [address] [place] [address] [phone]         Ottie Glazier, MD. Schedule an appointment as soon as possible for a visit in 2 week(s).   Specialty: Pulmonary Disease Why: [facility] Discharge F/UP Contact information: [place] [address] [phone]                Discharge Exam: Danley Danker Weights   02/08/22 0834  Weight: 98 kg   Eyes: PERRL, lids and conjunctivae normal ENMT: Mucous membranes are moist.  Neck: normal, supple Respiratory: decreased breath sounds b/l.  No wheezing, rales, rhonchi no accessory muscle use.  Cardiovascular: Regular rate and rhythm,  No rubs / gallops. 2+ b/l LE pitting edema  Abdomen: soft, obese, no tenderness, bowel  sounds positive.  Musculoskeletal: no clubbing / cyanosis.  Good ROM, no contractures.   Skin: no rashes, lesions, ulcers.  Neurologic: CN 2-12 grossly intact.  Psychiatric: Normal judgment and insight. Alert and oriented x 3. Normal mood.   Condition at discharge: good  The results of significant diagnostics from this hospitalization (including imaging, microbiology, ancillary and laboratory) are listed below for reference.   Imaging Studies: DG Chest 2 View  Result Date: 02/08/2022 CLINICAL DATA:  Shortness of breath. EXAM: CHEST - 2 VIEW COMPARISON:  08/11/2021 FINDINGS: AP film is markedly lordotic. The cardio pericardial silhouette is enlarged. Interstitial markings are diffusely coarsened with chronic features. Streaky opacity in the lower lung suggests atelectasis. Possible tiny effusions. Bones are demineralized. Bilateral shoulder replacement. Telemetry leads overlie the chest. Anterior cervical fusion plate again noted with displacement of the inferior-most screws, stable in the interval. IMPRESSION: 1. Low volume film with bibasilar atelectasis and possible tiny effusions. 2. Chronic interstitial coarsening. Electronically Signed   By: Misty Stanley M.D.   On: 02/08/2022 09:16    Microbiology: Results for orders placed or performed during the hospital encounter of 02/08/22  Resp Panel by RT-PCR (Flu A&B, Covid) Anterior Nasal Swab     Status: None   Collection Time: 02/08/22  9:14 AM   Specimen: Anterior Nasal Swab  Result Value Ref Range Status   SARS Coronavirus 2 by RT PCR NEGATIVE NEGATIVE Final    Comment: (NOTE) SARS-CoV-2 target nucleic acids are NOT DETECTED.  The SARS-CoV-2 RNA is generally detectable in upper respiratory specimens during the acute phase of infection. The lowest concentration of SARS-CoV-2 viral copies this assay can detect is 138 copies/mL. A negative result does not preclude SARS-Cov-2 infection and should not be used as the sole basis for  treatment or other patient management decisions. A negative result may occur with  improper specimen collection/handling, submission of specimen other than nasopharyngeal swab, presence of viral mutation(s) within the areas targeted by this assay, and inadequate number of viral copies(<138 copies/mL). A negative result must be combined with clinical observations, patient history, and epidemiological information. The expected result is Negative.  Fact Sheet for Patients:  EntrepreneurPulse.com.au  Fact Sheet for Healthcare Providers:  IncredibleEmployment.be  This test is no t yet approved or cleared by the [place] FDA and  has been authorized for detection and/or diagnosis of SARS-CoV-2 by FDA under an Emergency Use Authorization (EUA). This EUA will remain  in effect (meaning this test can be used)  for the duration of the COVID-19 declaration under Section 564(b)(1) of the Act, 21 U.S.C.section 360bbb-3(b)(1), unless the authorization is terminated  or revoked sooner.       Influenza A by PCR NEGATIVE NEGATIVE Final   Influenza B by PCR NEGATIVE NEGATIVE Final    Comment: (NOTE) The Xpert Xpress SARS-CoV-2/FLU/RSV plus assay is intended as an aid in the diagnosis of influenza from Nasopharyngeal swab specimens and should not be used as a sole basis for treatment. Nasal washings and aspirates are unacceptable for Xpert Xpress SARS-CoV-2/FLU/RSV testing.  Fact Sheet for Patients: EntrepreneurPulse.com.au  Fact Sheet for Healthcare Providers: IncredibleEmployment.be  This test is not yet approved or cleared by the [place] FDA and has been authorized for detection and/or diagnosis of SARS-CoV-2 by FDA under an Emergency Use Authorization (EUA). This EUA will remain in effect (meaning this test can be used) for the duration of the COVID-19 declaration under Section 564(b)(1) of the Act, 21  U.S.C. section 360bbb-3(b)(1), unless the authorization is terminated or revoked.  Performed at [facility], [address] [place],  [address]     Labs: CBC: Recent Labs  Lab 02/09/22 0706  WBC 7.5  HGB 13.0  HCT 41.7  MCV 99.3  PLT 711   Basic Metabolic Panel: Recent Labs  Lab 02/08/22 0841 02/09/22 0706  NA 143 142  K 3.7 3.5  CL 106 103  CO2 28 32  GLUCOSE 118* 125*  BUN 34* 30*  CREATININE 1.00 0.81  CALCIUM 9.5 8.6*   Liver Function Tests: No results for input(s): "AST", "ALT", "ALKPHOS", "BILITOT", "PROT", "ALBUMIN" in the last 168 hours. CBG: Recent Labs  Lab 02/09/22 0755 02/09/22 1147  GLUCAP 92 145*    Discharge time spent: greater than 30 minutes.  Signed: Max Sane, MD [facility] 02/09/2022

## 2022-02-10 LAB — HEMOGLOBIN A1C
Hgb A1c MFr Bld: 5.8 % — ABNORMAL HIGH (ref 4.8–5.6)
Mean Plasma Glucose: 120 mg/dL

## 2022-02-12 DIAGNOSIS — J449 Chronic obstructive pulmonary disease, unspecified: Secondary | ICD-10-CM | POA: Diagnosis not present

## 2022-02-13 ENCOUNTER — Ambulatory Visit: Payer: Medicare HMO | Admitting: Physical Therapy

## 2022-02-13 DIAGNOSIS — M6281 Muscle weakness (generalized): Secondary | ICD-10-CM

## 2022-02-13 DIAGNOSIS — R269 Unspecified abnormalities of gait and mobility: Secondary | ICD-10-CM

## 2022-02-13 DIAGNOSIS — R293 Abnormal posture: Secondary | ICD-10-CM

## 2022-02-13 DIAGNOSIS — M5459 Other low back pain: Secondary | ICD-10-CM

## 2022-02-13 DIAGNOSIS — R2689 Other abnormalities of gait and mobility: Secondary | ICD-10-CM

## 2022-02-13 NOTE — Therapy (Signed)
OUTPATIENT PHYSICAL THERAPY THORACOLUMBAR TREATMENT Physical Therapy Progress Note   Dates of reporting period  11/10/21  to  02/13/22   Patient Name: Hannah Powell MRN: [id number] DOB:27-Jun-1945, 76 y.o., female Today's Date: 02/13/2022   PT End of Session -     Visit Number 10 of 19   04/20/2022   PT Start Time 0815 to 0902  (47 minutes)   Activity Tolerance Patient tolerated treatment well    Behavior During Therapy [facility] for tasks assessed/performed          Past Medical History:  Diagnosis Date   Anxiety    Arthritis    CHF (congestive heart failure) (HCC)    COPD (chronic obstructive pulmonary disease) (HCC)    Dysplastic nevus 04/22/2019   R abdomen parallel to sup umbilicus - mod   Dysplastic nevus 04/22/2019   R low back above waistline - mild    Edema    feet/legs   Hypertension    dr Darryll Capers      Hypothyroidism    Leg weakness, bilateral    Osteoporosis    Oxygen deficit    2l with bipap at night   RLS (restless legs syndrome)    Shortness of breath    Sleep apnea    bipap   Wheezing    Past Surgical History:  Procedure Laterality Date   BACK SURGERY     cervical   BREAST BIOPSY Left    needle bx-neg   CATARACT EXTRACTION W/PHACO Left 05/07/2017   Procedure: CATARACT EXTRACTION PHACO AND INTRAOCULAR LENS PLACEMENT (IOC);  Surgeon: Galen Manila, MD;  Location: [facility] ORS;  Service: Ophthalmology;  Laterality: Left;  [place] 00:48.7 AP% 11.3 CDE 5.46 Fluid Pack Lot # [id number]   CATARACT EXTRACTION W/PHACO Right 04/23/2017   Procedure: CATARACT EXTRACTION PHACO AND INTRAOCULAR LENS PLACEMENT (IOC);  Surgeon: Galen Manila, MD;  Location: [facility] ORS;  Service: Ophthalmology;  Laterality: Right;  [place] 00:29 AP% 12.8 CDE 3.79 FLUID PACK LOT # [id number]   FOOT ARTHROPLASTY     JOINT REPLACEMENT     x2 tkr   TOTAL SHOULDER ARTHROPLASTY Left 07/02/2013   Procedure: LEFT TOTAL SHOULDER ARTHROPLASTY;  Surgeon: Senaida Lange, MD;  Location: [facility] OR;   Service: Orthopedics;  Laterality: Left;   TOTAL SHOULDER ARTHROPLASTY Right 04/10/2018   Procedure: TOTAL REVERSE SHOULDER ARTHROPLASTY;  Surgeon: Francena Hanly, MD;  Location: [facility] ORS;  Service: Orthopedics;  Laterality: Right;    Patient Active Problem List   Diagnosis Date Noted   Restless leg syndrome 02/08/2022   Hypothyroidism 02/08/2022   Obesity (BMI 30-39.9) 02/08/2022   Insomnia 02/08/2022   Anxiety 02/08/2022   Poor appetite 02/08/2022   S/P reverse total shoulder arthroplasty, 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   Chronic diastolic heart failure (HCC) 08/09/2016   COPD (chronic obstructive pulmonary disease) (HCC) 08/09/2016   Pulmonary infiltrates    Pneumonia due to Streptococcus pneumoniae [facility])    Fever    Community acquired pneumonia of right lung    Palliative care by specialist    Goals of care, counseling/discussion    Pressure injury of skin 07/03/2016   Acute respiratory failure with hypoxia (HCC) 07/02/2016   COPD exacerbation (HCC) 07/21/2015   Acute renal insufficiency 07/21/2015   Hyperglycemia 07/21/2015   HTN (hypertension) 07/21/2015   S/P shoulder replacement 07/02/2013    PCP: Lynnea Ferrier, MD  REFERRING PROVIDER: Lynnea Ferrier, MD  REFERRING DIAG: Low back pain  Rationale for Evaluation and Treatment Rehabilitation  THERAPY DIAG:  Other low back pain  Muscle weakness (generalized)  Gait difficulty  Abnormal posture  Imbalance  ONSET DATE: 03/20/2019  SUBJECTIVE:                                                                                                                                                                                           SUBJECTIVE STATEMENT:  EVALUATION Pt. Reports chronic h/o lumbar stenosis/ back pain.  Pt. Reports 8/10 low back pain in morning and uses heating pad/ ibuprofen to decrease back pain to 1/10 after about 2 hours.  Pt. Has returned to [employer] and aquatic ex. Recently.    PERTINENT  HISTORY:  See MD notes.    PAIN:  Are you having pain? Yes: NPRS scale: 1-2/10 Pain location: low back Pain description: aching/ stiff Aggravating factors: prolonged activity Relieving factors: heat/ ibuprofen   PRECAUTIONS: Fall  WEIGHT BEARING RESTRICTIONS No  FALLS:  Has patient fallen in last 6 months? No  LIVING ENVIRONMENT: Lives with: lives with their spouse Lives in: House/apartment  OCCUPATION: Retired  PLOF: Independent with household mobility with device  PATIENT GOALS Increase ability to stand from chair/ improve upright posture/ decrease low back pain.     OBJECTIVE:   PATIENT SURVEYS:  FOTO initial 57/ goal 15  SCREENING FOR RED FLAGS: Bowel or bladder incontinence: No Spinal tumors: No Cauda equina syndrome: No Compression fracture: No Abdominal aneurysm: No  COGNITION:  Overall cognitive status: Within functional limits for tasks assessed   SENSATION: Not tested  POSTURE: rounded shoulders, forward head, and increased thoracic kyphosis  PALPATION: Tenderness over lumbar paraspinals.   LUMBAR ROM:   Active  A/PROM  eval  Flexion 50% limited  Extension 75%  Right lateral flexion 50%  Left lateral flexion 50%  Right rotation 50%  Left rotation 50%   (Blank rows = not tested)  LOWER EXTREMITY ROM:     B hip flexion/ knee extension limited.    LOWER EXTREMITY MMT:    B LE muscle strength grossly 4/5 MMT except hamstring 4+/5 MMT  5xSTS: 58sec. From 23" blue mat table (challenged).    Ascends steps with L LE and heavy UE assist.   GAIT: Distance walked: In clinic Assistive device utilized: Walker - 2 wheeled  Elevated forearm support Level of assistance: Modified independence Comments: Significant forward head/ flexed posture/ thoracic kyphosis   TODAY'S TREATMENT    02/13/22:   Subjective:  Pt. Spent Thanksgiving at the ER due to hypoxia.  Pt. Was in observation until O2 sat. Returned to normal.      There.ex:    Nustep L4-2 10  min. B UE/LE.  Discussed upcoming Thanksgiving events.  O2 sat: 97%/ 91% (before/ after), HR: 69 bpm/ 78 bpm.  BP: 151/88     Walking in //-bars: forward with added 3" plinth step overs 2 laps. (Fatigue).  Lateral/ backwards walking in //-bars with light UE assist required for safety.  Moderate fatigue with seated rest break.     Nautilus: seated lat. Pull downs 50#/ standing tricep extension 30x each.  Chair behind pt. For safety.      Walking in clinic/ gym and out to car with RW and cuing to increase upright posture/ head position.  No LOB/ cuing to correct head/ upright posture.     Discussed HEP   PATIENT EDUCATION:  Education details: HEP/ upright posture/ Sit stands Person educated: Patient Education method: Medical illustrator Education comprehension: verbalized understanding and returned demonstration   HOME EXERCISE PROGRAM: Exercises at [employer] use of recumbent bike.    ASSESSMENT:  CLINICAL IMPRESSION: Pt. Continues to require heavy UE assist in //-bars and with RW for all mobility tasks.  Pt focused on maintaining pt. BOS and decreasing use of UE with standing/ wt. Shifting. No LOB during tx and no c/o low back symptoms. Moderate fatigue during tx. And pt. Several seated rest breaks.  Pts. O2 sats. Remained above 92% with activity/ rest.  Pt. Will benefit from skilled PT services in combination with gym based ex. To improve LE strengthening/ pain-free mobility.     OBJECTIVE IMPAIRMENTS Abnormal gait, cardiopulmonary status limiting activity, decreased activity tolerance, decreased balance, decreased coordination, decreased endurance, decreased mobility, difficulty walking, decreased ROM, decreased strength, impaired flexibility, improper body mechanics, postural dysfunction, and pain.   ACTIVITY LIMITATIONS carrying, lifting, bending, standing, squatting, transfers, bed mobility, bathing, toileting, dressing, and  locomotion level  PARTICIPATION LIMITATIONS: meal prep, cleaning, laundry, driving, shopping, and community activity  PERSONAL FACTORS Age, Fitness, Past/current experiences, and 3+ comorbidities:      REHAB POTENTIAL: Good  CLINICAL DECISION MAKING: Evolving/moderate complexity  EVALUATION COMPLEXITY: Moderate   GOALS: Goals reviewed with patient? Yes  SHORT TERM GOALS: Target date: 02/23/22  Pt. Will be independent with HEP to increase LE muscle strength 1/2 muscle grade to improve transfers/ safety with gait.  Baseline:  see above.  11/10: B hip strength grossly 4/5 MMT and quad/hamstring 4+/5 MMT Goal status: Partially met   LONG TERM GOALS: Target date: 04/20/22  Pt. Will increase FOTO to 63 to improve pain-free mobility.   Baseline: initial FOTO 57.  10/16: 54 (no improvement)- limited with mobility.  Goal status: Not met  2.  Pt. Able to stand from 21# chair height with no UE assist to improve transfers/ independence with daily activity.   Baseline:  Goal status: Not met  3.  Pt. Will decrease 5xSTS to <30 sec. To improve functional mobility/ decrease fall risk.  Baseline: 58sec. From 23" blue mat table (challenged).  10/16: gray chair with UE assist: 19.7 seconds Goal status: Partially met   PLAN: PT FREQUENCY: 1-2x/week  PT DURATION: 12 weeks  PLANNED INTERVENTIONS: Therapeutic exercises, Therapeutic activity, Neuromuscular re-education, Balance training, Gait training, Patient/Family education, Self Care, Joint mobilization, Stair training, Aquatic Therapy, Moist heat, Manual therapy, and Re-evaluation.  PLAN FOR NEXT SESSION: Reassess vitals/ increase walking endurance.  Cammie Mcgee, PT, DPT # [phone] 02/14/2022, 7:39 AM

## 2022-02-16 ENCOUNTER — Encounter: Payer: Medicare HMO | Admitting: Physical Therapy

## 2022-02-21 ENCOUNTER — Ambulatory Visit: Payer: Medicare HMO | Attending: Internal Medicine | Admitting: Physical Therapy

## 2022-02-21 DIAGNOSIS — M5459 Other low back pain: Secondary | ICD-10-CM | POA: Insufficient documentation

## 2022-02-21 DIAGNOSIS — M6281 Muscle weakness (generalized): Secondary | ICD-10-CM | POA: Insufficient documentation

## 2022-02-21 DIAGNOSIS — R2689 Other abnormalities of gait and mobility: Secondary | ICD-10-CM | POA: Diagnosis not present

## 2022-02-21 DIAGNOSIS — R293 Abnormal posture: Secondary | ICD-10-CM | POA: Insufficient documentation

## 2022-02-21 DIAGNOSIS — R269 Unspecified abnormalities of gait and mobility: Secondary | ICD-10-CM | POA: Insufficient documentation

## 2022-02-23 ENCOUNTER — Encounter: Payer: Medicare HMO | Admitting: Physical Therapy

## 2022-02-24 NOTE — Therapy (Signed)
OUTPATIENT PHYSICAL THERAPY THORACOLUMBAR TREATMENT  Patient Name: Hannah Powell MRN: [id number] DOB:Jul 30, 1945, 76 y.o., female Today's Date: 02/21/2022   PT End of Session -     Visit Number 11 of 19   04/20/2022   PT Start Time 1120 to 1206  (46 minutes)   Activity Tolerance Patient tolerated treatment well    Behavior During Therapy [facility] for tasks assessed/performed          Past Medical History:  Diagnosis Date   Anxiety    Arthritis    CHF (congestive heart failure) (HCC)    COPD (chronic obstructive pulmonary disease) ([place])    Dysplastic nevus 04/22/2019   R abdomen parallel to sup umbilicus - mod   Dysplastic nevus 04/22/2019   R low back above waistline - mild    Edema    feet/legs   Hypertension    dr Otho Najjar      Hypothyroidism    Leg weakness, bilateral    Osteoporosis    Oxygen deficit    2l with bipap at night   RLS (restless legs syndrome)    Shortness of breath    Sleep apnea    bipap   Wheezing    Past Surgical History:  Procedure Laterality Date   BACK SURGERY     cervical   BREAST BIOPSY Left    needle bx-neg   CATARACT EXTRACTION W/PHACO Left 05/07/2017   Procedure: CATARACT EXTRACTION PHACO AND INTRAOCULAR LENS PLACEMENT ([place]);  Surgeon: Birder Robson, MD;  Location: [facility];  Service: Ophthalmology;  Laterality: Left;  [place] 00:48.7 AP% 11.3 CDE 5.46 Fluid Pack Lot # [id number]   CATARACT EXTRACTION W/PHACO Right 04/23/2017   Procedure: CATARACT EXTRACTION PHACO AND INTRAOCULAR LENS PLACEMENT ([facility]);  Surgeon: Birder Robson, MD;  Location: [facility];  Service: Ophthalmology;  Laterality: Right;  [place] 00:29 AP% 12.8 CDE 3.79 FLUID PACK LOT # [id number]   FOOT ARTHROPLASTY     JOINT REPLACEMENT     x2 tkr   TOTAL SHOULDER ARTHROPLASTY Left 07/02/2013   Procedure: LEFT TOTAL SHOULDER ARTHROPLASTY;  Surgeon: Marin Shutter, MD;  Location: [place];  Service: Orthopedics;  Laterality: Left;   TOTAL SHOULDER ARTHROPLASTY Right 04/10/2018    Procedure: TOTAL REVERSE SHOULDER ARTHROPLASTY;  Surgeon: Justice Britain, MD;  Location: [facility];  Service: Orthopedics;  Laterality: Right;  194mn   Patient Active Problem List   Diagnosis Date Noted   Restless leg syndrome 02/08/2022   Hypothyroidism 02/08/2022   Obesity (BMI 30-39.9) 02/08/2022   Insomnia 02/08/2022   Anxiety 02/08/2022   Poor appetite 02/08/2022   S/P reverse total shoulder arthroplasty, 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   Chronic diastolic heart failure ([place] 08/09/2016   COPD (chronic obstructive pulmonary disease) ([place] 08/09/2016   Pulmonary infiltrates    Pneumonia due to Streptococcus pneumoniae (HCC)    Fever    Community acquired pneumonia of right lung    Palliative care by specialist    Goals of care, counseling/discussion    Pressure injury of skin 07/03/2016   Ac[place] with [place] ([place] 07/02/2016   COPD exacerbation ([place] 07/21/2015   Acute renal insufficiency 07/21/2015   Hyperglycemia 07/21/2015   HTN (hypertension) 07/21/2015   S/P shoulder replacement 07/02/2013    PCP: KAdin Hector MD  REFERRING PROVIDER: KAdin Hector MD  REFERRING DIAG: Low back pain  Rationale for Evaluation and Treatment Rehabilitation  THERAPY DIAG:  Muscle weakness (generalized)  Gait difficulty  Other low back pain  Abnormal  posture  Imbalance  ONSET DATE: 03/20/2019  SUBJECTIVE:                                                                                                                                                                                           SUBJECTIVE STATEMENT:  EVALUATION Pt. Reports chronic h/o lumbar stenosis/ back pain.  Pt. Reports 8/10 low back pain in morning and uses heating pad/ ibuprofen to decrease back pain to 1/10 after about 2 hours.  Pt. Has returned to [employer] and aquatic ex. Recently.    PERTINENT HISTORY:  See MD notes.    PAIN:  Are you having pain? Yes: NPRS scale: 1-2/10 Pain  location: low back Pain description: aching/ stiff Aggravating factors: prolonged activity Relieving factors: heat/ ibuprofen   PRECAUTIONS: Fall  WEIGHT BEARING RESTRICTIONS No  FALLS:  Has patient fallen in last 6 months? No  LIVING ENVIRONMENT: Lives with: lives with their spouse Lives in: House/apartment  OCCUPATION: Retired  PLOF: Independent with household mobility with device  PATIENT GOALS Increase ability to stand from chair/ improve upright posture/ decrease low back pain.     OBJECTIVE:   PATIENT SURVEYS:  FOTO initial 57/ goal 22  SCREENING FOR RED FLAGS: Bowel or bladder incontinence: No Spinal tumors: No Cauda equina syndrome: No Compression fracture: No Abdominal aneurysm: No  COGNITION:  Overall cognitive status: Within functional limits for tasks assessed   SENSATION: Not tested  POSTURE: rounded shoulders, forward head, and increased thoracic kyphosis  PALPATION: Tenderness over lumbar paraspinals.   LUMBAR ROM:   Active  A/PROM  eval  Flexion 50% limited  Extension 75%  Right lateral flexion 50%  Left lateral flexion 50%  Right rotation 50%  Left rotation 50%   (Blank rows = not tested)  LOWER EXTREMITY ROM:     B hip flexion/ knee extension limited.    LOWER EXTREMITY MMT:    B LE muscle strength grossly 4/5 MMT except hamstring 4+/5 MMT  5xSTS: 58sec. From 23" blue mat table (challenged).    Ascends steps with L LE and heavy UE assist.   GAIT: Distance walked: In clinic Assistive device utilized: Walker - 2 wheeled  Elevated forearm support Level of assistance: Modified independence Comments: Significant forward head/ flexed posture/ thoracic kyphosis   TODAY'S TREATMENT    02/21/22:   Subjective:  Pt. States she went to [employer] the other day by herself and was exhausted.  Pt. Realized how difficult it was to complete chores independently.  Pt. Arrived to PT with husband assist to get RW out of car.   Pts. Vitals: O2 sat:  95% and HR 68 bpm.     There.ex:    Nustep L410 min. B UE/LE.  Discussed pts. Trip to [employer] and returning to gym.       Walking in //-bars: forward/ lateral/ backwards with light UE assist required for safety.  Moderate fatigue with seated rest break.     Nautilus (with handles): seated lat. Pull downs 50#/ standing tricep extension 30x each.  Chair behind pt. For safety.  Pt. Reports improvement with overhead reaching into kitchen cabinets since starting UE ex. With Nautilus.     Seated/ standing UE wand ex. (Chest press/ sh. Flexion)- 20x.     Walking in clinic/ gym and out to car with RW and cuing to increase upright posture/ head position.  No LOB/ cuing to correct head/ upright posture.     Discussed HEP/ returning to gym.   PATIENT EDUCATION:  Education details: HEP/ upright posture/ Sit stands Person educated: Patient Education method: Customer service manager Education comprehension: verbalized understanding and returned demonstration   HOME EXERCISE PROGRAM: Exercises at [employer] use of recumbent bike.    ASSESSMENT:  CLINICAL IMPRESSION: Pt. Continues to require heavy UE assist in //-bars and with RW for all mobility tasks.  Pt focused on maintaining pt. BOS and decreasing use of UE with standing/ wt. Shifting. No LOB during tx and no c/o low back symptoms. Moderate fatigue during tx. And pt. Several seated rest breaks.  Pts. O2 sats. Remained above 92% with all activity/ rest.  Pt. Will benefit from skilled PT services in combination with gym based ex. To improve LE strengthening/ pain-free mobility.     OBJECTIVE IMPAIRMENTS Abnormal gait, cardiopulmonary status limiting activity, decreased activity tolerance, decreased balance, decreased coordination, decreased endurance, decreased mobility, difficulty walking, decreased ROM, decreased strength, impaired flexibility, improper body mechanics, postural dysfunction, and  pain.   ACTIVITY LIMITATIONS carrying, lifting, bending, standing, squatting, transfers, bed mobility, bathing, toileting, dressing, and locomotion level  PARTICIPATION LIMITATIONS: meal prep, cleaning, laundry, driving, shopping, and community activity  PERSONAL FACTORS Age, Fitness, Past/current experiences, and 3+ comorbidities:      REHAB POTENTIAL: Good  CLINICAL DECISION MAKING: Evolving/moderate complexity  EVALUATION COMPLEXITY: Moderate   GOALS: Goals reviewed with patient? Yes  SHORT TERM GOALS: Target date: 02/23/22  Pt. Will be independent with HEP to increase LE muscle strength 1/2 muscle grade to improve transfers/ safety with gait.  Baseline:  see above.  11/10: B hip strength grossly 4/5 MMT and quad/hamstring 4+/5 MMT Goal status: Partially met   LONG TERM GOALS: Target date: 04/20/22  Pt. Will increase FOTO to 63 to improve pain-free mobility.   Baseline: initial FOTO 57.  10/16: 54 (no improvement)- limited with mobility.  Goal status: Not met  2.  Pt. Able to stand from 21# chair height with no UE assist to improve transfers/ independence with daily activity.   Baseline:  Goal status: Not met  3.  Pt. Will decrease 5xSTS to <30 sec. To improve functional mobility/ decrease fall risk.  Baseline: 58sec. From 23" blue mat table (challenged).  10/16: gray chair with UE assist: 19.7 seconds Goal status: Partially met   PLAN: PT FREQUENCY: 1-2x/week  PT DURATION: 12 weeks  PLANNED INTERVENTIONS: Therapeutic exercises, Therapeutic activity, Neuromuscular re-education, Balance training, Gait training, Patient/Family education, Self Care, Joint mobilization, Stair training, Aquatic Therapy, Moist heat, Manual therapy, and Re-evaluation.  PLAN FOR NEXT SESSION: Reassess vitals/ increase walking endurance.  Pura Spice, PT, DPT # [id number] 02/24/2022, 7:37  PM  

## 2022-03-02 ENCOUNTER — Ambulatory Visit: Payer: Medicare HMO | Admitting: Physical Therapy

## 2022-03-02 DIAGNOSIS — R2689 Other abnormalities of gait and mobility: Secondary | ICD-10-CM | POA: Diagnosis not present

## 2022-03-02 DIAGNOSIS — M6281 Muscle weakness (generalized): Secondary | ICD-10-CM

## 2022-03-02 DIAGNOSIS — M5459 Other low back pain: Secondary | ICD-10-CM

## 2022-03-02 DIAGNOSIS — R293 Abnormal posture: Secondary | ICD-10-CM | POA: Diagnosis not present

## 2022-03-02 DIAGNOSIS — R269 Unspecified abnormalities of gait and mobility: Secondary | ICD-10-CM | POA: Diagnosis not present

## 2022-03-02 NOTE — Therapy (Signed)
OUTPATIENT PHYSICAL THERAPY THORACOLUMBAR TREATMENT  Patient Name: Hannah Powell MRN: [id number] DOB:[birth year], 76 y.o., female Today's Date: 03/02/2022   PT End of Session -     Visit Number 12 of 19   04/20/2022   PT Start Time 1116 to 1202  (46 minutes)   Activity Tolerance Patient tolerated treatment well    Behavior During Therapy [facility] for tasks assessed/performed          Past Medical History:  Diagnosis Date   Anxiety    Arthritis    CHF (congestive heart failure) (HCC)    COPD (chronic obstructive pulmonary disease) ([place])    Dysplastic nevus 04/22/2019   R abdomen parallel to sup umbilicus - mod   Dysplastic nevus 04/22/2019   R low back above waistline - mild    Edema    feet/legs   Hypertension    dr Otho Najjar      Hypothyroidism    Leg weakness, bilateral    Osteoporosis    Oxygen deficit    2l with bipap at night   RLS (restless legs syndrome)    Shortness of breath    Sleep apnea    bipap   Wheezing    Past Surgical History:  Procedure Laterality Date   BACK SURGERY     cervical   BREAST BIOPSY Left    needle bx-neg   CATARACT EXTRACTION W/PHACO Left 05/07/2017   Procedure: CATARACT EXTRACTION PHACO AND INTRAOCULAR LENS PLACEMENT ([place]);  Surgeon: Birder Robson, MD;  Location: [facility];  Service: Ophthalmology;  Laterality: Left;  [place] 00:48.7 AP% 11.3 CDE 5.46 Fluid Pack Lot # [id number] H   CATARACT EXTRACTION W/PHACO Right 04/23/2017   Procedure: CATARACT EXTRACTION PHACO AND INTRAOCULAR LENS PLACEMENT ([facility]);  Surgeon: Birder Robson, MD;  Location: [facility];  Service: Ophthalmology;  Laterality: Right;  [place] 00:29 AP% 12.8 CDE 3.79 FLUID PACK LOT # [id number] H   FOOT ARTHROPLASTY     JOINT REPLACEMENT     x2 tkr   TOTAL SHOULDER ARTHROPLASTY Left 07/02/2013   Procedure: LEFT TOTAL SHOULDER ARTHROPLASTY;  Surgeon: Marin Shutter, MD;  Location: [place];  Service: Orthopedics;  Laterality: Left;   TOTAL SHOULDER ARTHROPLASTY Right 04/10/2018    Procedure: TOTAL REVERSE SHOULDER ARTHROPLASTY;  Surgeon: Justice Britain, MD;  [facility];  Service: Orthopedics;  Laterality: Right;  126mn   Patient Active Problem List   Diagnosis Date Noted   Restless leg syndrome 02/08/2022   Hypothyroidism 02/08/2022   Obesity (BMI 30-39.9) 02/08/2022   Insomnia 02/08/2022   Anxiety 02/08/2022   Poor appetite 02/08/2022   S/P reverse total shoulder arthroplasty, 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   Chronic diastolic heart failure ([place] 08/09/2016   COPD (chronic obstructive pulmonary disease) ([place] 08/09/2016   Pulmonary infiltrates    Pneumonia due to Streptococcus pneumoniae (HCC)    Fever    Community acquired pneumonia of right lung    Palliative care by specialist    Goals of care, counseling/discussion    Pressure injury of skin 07/03/2016   Acute respiratory failure with hypoxia ([place] 07/02/2016   COPD exacerbation ([place] 07/21/2015   Acute renal insufficiency 07/21/2015   Hyperglycemia 07/21/2015   HTN (hypertension) 07/21/2015   S/P shoulder replacement 07/02/2013    PCP: KAdin Hector MD  REFERRING PROVIDER: KAdin Hector MD  REFERRING DIAG: Low back pain  Rationale for Evaluation and Treatment Rehabilitation  THERAPY DIAG:  Muscle weakness (generalized)  Gait difficulty  Other low back pain  Abnormal  posture  Imbalance  ONSET DATE: 03/20/2019  SUBJECTIVE:                                                                                                                                                                                           SUBJECTIVE STATEMENT:  EVALUATION Pt. Reports chronic h/o lumbar stenosis/ back pain.  Pt. Reports 8/10 low back pain in morning and uses heating pad/ ibuprofen to decrease back pain to 1/10 after about 2 hours.  Pt. Has returned to [employer] and aquatic ex. Recently.    PERTINENT HISTORY:  See MD notes.    PAIN:  Are you having pain? Yes: NPRS scale: 1-2/10 Pain  location: low back Pain description: aching/ stiff Aggravating factors: prolonged activity Relieving factors: heat/ ibuprofen   PRECAUTIONS: Fall  WEIGHT BEARING RESTRICTIONS No  FALLS:  Has patient fallen in last 6 months? No  LIVING ENVIRONMENT: Lives with: lives with their spouse Lives in: House/apartment  OCCUPATION: Retired  PLOF: Independent with household mobility with device  PATIENT GOALS Increase ability to stand from chair/ improve upright posture/ decrease low back pain.     OBJECTIVE:   PATIENT SURVEYS:  FOTO initial 57/ goal 32  SCREENING FOR RED FLAGS: Bowel or bladder incontinence: No Spinal tumors: No Cauda equina syndrome: No Compression fracture: No Abdominal aneurysm: No  COGNITION:  Overall cognitive status: Within functional limits for tasks assessed   SENSATION: Not tested  POSTURE: rounded shoulders, forward head, and increased thoracic kyphosis  PALPATION: Tenderness over lumbar paraspinals.   LUMBAR ROM:   Active  A/PROM  eval  Flexion 50% limited  Extension 75%  Right lateral flexion 50%  Left lateral flexion 50%  Right rotation 50%  Left rotation 50%   (Blank rows = not tested)  LOWER EXTREMITY ROM:     B hip flexion/ knee extension limited.    LOWER EXTREMITY MMT:    B LE muscle strength grossly 4/5 MMT except hamstring 4+/5 MMT  5xSTS: 58sec. From 23" blue mat table (challenged).    Ascends steps with L LE and heavy UE assist.   GAIT: Distance walked: In clinic Assistive device utilized: Walker - 2 wheeled  Elevated forearm support Level of assistance: Modified independence Comments: Significant forward head/ flexed posture/ thoracic kyphosis   TODAY'S TREATMENT    03/02/22:   Subjective:  Pt. Arrive to PT without husband assist and parked/ walked into PT clinic from side of clinic.  No c/o back pain reported.  Pt. Busy getting ready for Christmas/ buying presents.     There.ex:    Nustep L4 for 10 min.  B UE/LE.  Stairs (step to gait pattern) with B handrails 4 stairs x 2.  Pt. Able to ascend with L and R LE.  Extra time/ focus with R LE step ups.      Nautilus (with handles): seated lat. Pull downs 50# 30x each.  Chair behind pt. For safety.  Pt. Reports improvement with overhead reaching into kitchen cabinets since starting UE ex. With Nautilus.     STS from blue mat (varying heights) with no UE assist 8x     Walking in clinic/ gym and out to car with RW and cuing to increase upright posture/ head position.  No LOB/ cuing to correct head/ upright posture.     Discussed HEP/ returning to gym.   PATIENT EDUCATION:  Education details: HEP/ upright posture/ Sit stands Person educated: Patient Education method: Customer service manager Education comprehension: verbalized understanding and returned demonstration   HOME EXERCISE PROGRAM: Exercises at [employer] use of recumbent bike.    ASSESSMENT:  CLINICAL IMPRESSION: Pt. Continues to require heavy UE assist at stairs and with RW for all mobility tasks.  Pt focused on maintaining pt. BOS and decreasing use of UE with standing/ wt. Shifting. No LOB during tx and no c/o low back symptoms. Moderate fatigue during tx. And pt. Several seated rest breaks.  Pt. Will benefit from skilled PT services in combination with gym based ex. To improve LE strengthening/ pain-free mobility.     OBJECTIVE IMPAIRMENTS Abnormal gait, cardiopulmonary status limiting activity, decreased activity tolerance, decreased balance, decreased coordination, decreased endurance, decreased mobility, difficulty walking, decreased ROM, decreased strength, impaired flexibility, improper body mechanics, postural dysfunction, and pain.   ACTIVITY LIMITATIONS carrying, lifting, bending, standing, squatting, transfers, bed mobility, bathing, toileting, dressing, and locomotion level  PARTICIPATION LIMITATIONS: meal prep, cleaning, laundry, driving, shopping,  and community activity  PERSONAL FACTORS Age, Fitness, Past/current experiences, and 3+ comorbidities:      REHAB POTENTIAL: Good  CLINICAL DECISION MAKING: Evolving/moderate complexity  EVALUATION COMPLEXITY: Moderate   GOALS: Goals reviewed with patient? Yes  SHORT TERM GOALS: Target date: 02/23/22  Pt. Will be independent with HEP to increase LE muscle strength 1/2 muscle grade to improve transfers/ safety with gait.  Baseline:  see above.  11/10: B hip strength grossly 4/5 MMT and quad/hamstring 4+/5 MMT Goal status: Partially met   LONG TERM GOALS: Target date: 04/20/22  Pt. Will increase FOTO to 63 to improve pain-free mobility.   Baseline: initial FOTO 57.  10/16: 54 (no improvement)- limited with mobility.  Goal status: Not met  2.  Pt. Able to stand from 21# chair height with no UE assist to improve transfers/ independence with daily activity.   Baseline:  Goal status: Not met  3.  Pt. Will decrease 5xSTS to <30 sec. To improve functional mobility/ decrease fall risk.  Baseline: 58sec. From 23" blue mat table (challenged).  10/16: gray chair with UE assist: 19.7 seconds Goal status: Partially met   PLAN: PT FREQUENCY: 1-2x/week  PT DURATION: 12 weeks  PLANNED INTERVENTIONS: Therapeutic exercises, Therapeutic activity, Neuromuscular re-education, Balance training, Gait training, Patient/Family education, Self Care, Joint mobilization, Stair training, Aquatic Therapy, Moist heat, Manual therapy, and Re-evaluation.  PLAN FOR NEXT SESSION: Reassess vitals/ increase walking endurance.  Pura Spice, PT, DPT # [id number] 03/02/2022, 12:28 PM

## 2022-03-08 ENCOUNTER — Ambulatory Visit: Payer: Medicare HMO | Admitting: Physical Therapy

## 2022-03-08 DIAGNOSIS — R293 Abnormal posture: Secondary | ICD-10-CM

## 2022-03-08 DIAGNOSIS — R269 Unspecified abnormalities of gait and mobility: Secondary | ICD-10-CM | POA: Diagnosis not present

## 2022-03-08 DIAGNOSIS — R2689 Other abnormalities of gait and mobility: Secondary | ICD-10-CM

## 2022-03-08 DIAGNOSIS — M6281 Muscle weakness (generalized): Secondary | ICD-10-CM | POA: Diagnosis not present

## 2022-03-08 DIAGNOSIS — M5459 Other low back pain: Secondary | ICD-10-CM | POA: Diagnosis not present

## 2022-03-08 NOTE — Therapy (Signed)
OUTPATIENT PHYSICAL THERAPY THORACOLUMBAR TREATMENT  Patient Name: Hannah Powell MRN: [id number] DOB:01-19-1946, 76 y.o., female Today's Date: 03/08/2022   PT End of Session -     Visit Number 45 of 19   04/20/2022   PT Start Time 1010 to 1102  (52 minutes)   Activity Tolerance Patient tolerated treatment well    Behavior During Therapy WFL for tasks assessed/performed          Past Medical History:  Diagnosis Date   Anxiety    Arthritis    CHF (congestive heart failure) (HCC)    COPD (chronic obstructive pulmonary disease) ([place])    Dysplastic nevus 04/22/2019   R abdomen parallel to sup umbilicus - mod   Dysplastic nevus 04/22/2019   R low back above waistline - mild    Edema    feet/legs   Hypertension    dr Otho Najjar      Hypothyroidism    Leg weakness, bilateral    Osteoporosis    Oxygen deficit    2l with bipap at night   RLS (restless legs syndrome)    Shortness of breath    Sleep apnea    bipap   Wheezing    Past Surgical History:  Procedure Laterality Date   BACK SURGERY     cervical   BREAST BIOPSY Left    needle bx-neg   CATARACT EXTRACTION W/PHACO Left 05/07/2017   Procedure: CATARACT EXTRACTION PHACO AND INTRAOCULAR LENS PLACEMENT ([place]);  Surgeon: Birder Robson, MD;  Location: [facility];  Service: Ophthalmology;  Laterality: Left;  [place] 00:48.7 AP% 11.3 CDE 5.46 Fluid Pack Lot # [id number]   CATARACT EXTRACTION W/PHACO Right 04/23/2017   Procedure: CATARACT EXTRACTION PHACO AND INTRAOCULAR LENS PLACEMENT ([facility]);  Surgeon: Birder Robson, MD;  Location: [facility];  Service: Ophthalmology;  Laterality: Right;  [place] 00:29 AP% 12.8 CDE 3.79 FLUID PACK LOT # [id number]   FOOT ARTHROPLASTY     JOINT REPLACEMENT     x2 tkr   TOTAL SHOULDER ARTHROPLASTY Left 07/02/2013   Procedure: LEFT TOTAL SHOULDER ARTHROPLASTY;  Surgeon: Marin Shutter, MD;  Location: [place];  Service: Orthopedics;  Laterality: Left;   TOTAL SHOULDER ARTHROPLASTY Right 04/10/2018    Procedure: TOTAL REVERSE SHOULDER ARTHROPLASTY;  Surgeon: Justice Britain, MD;  Location: [facility];  Service: Orthopedics;  Laterality: Right;  130mn   Patient Active Problem List   Diagnosis Date Noted   Restless leg syndrome 02/08/2022   Hypothyroidism 02/08/2022   Obesity (BMI 30-39.9) 02/08/2022   Insomnia 02/08/2022   Anxiety 02/08/2022   Poor appetite 02/08/2022   S/P reverse total shoulder arthroplasty, 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   Chronic diastolic heart failure ([place] 08/09/2016   COPD (chronic obstructive pulmonary disease) ([place] 08/09/2016   Pulmonary infiltrates    Pneumonia due to Streptococcus pneumoniae (HCC)    Fever    Community acquired pneumonia of right lung    Palliative care by specialist    Goals of care, counseling/discussion    Pressure injury of skin 07/03/2016   Acute respiratory failure with hypoxia ([facility] 07/02/2016   COPD exacerbation ([place] 07/21/2015   Acute renal insufficiency 07/21/2015   Hyperglycemia 07/21/2015   HTN (hypertension) 07/21/2015   S/P shoulder replacement 07/02/2013    PCP: KAdin Hector MD  REFERRING PROVIDER: KAdin Hector MD  REFERRING DIAG: Low back pain  Rationale for Evaluation and Treatment Rehabilitation  THERAPY DIAG:  Muscle weakness (generalized)  Gait difficulty  Other low back pain  Abnormal  posture  Imbalance  ONSET DATE: 03/20/2019  SUBJECTIVE:                                                                                                                                                                                           SUBJECTIVE STATEMENT:  EVALUATION Pt. Reports chronic h/o lumbar stenosis/ back pain.  Pt. Reports 8/10 low back pain in morning and uses heating pad/ ibuprofen to decrease back pain to 1/10 after about 2 hours.  Pt. Has returned to [employer] and aquatic ex. Recently.    PERTINENT HISTORY:  See MD notes.    PAIN:  Are you having pain? Yes: NPRS scale: 1-2/10 Pain  location: low back Pain description: aching/ stiff Aggravating factors: prolonged activity Relieving factors: heat/ ibuprofen   PRECAUTIONS: Fall  WEIGHT BEARING RESTRICTIONS No  FALLS:  Has patient fallen in last 6 months? No  LIVING ENVIRONMENT: Lives with: lives with their spouse Lives in: House/apartment  OCCUPATION: Retired  PLOF: Independent with household mobility with device  PATIENT GOALS Increase ability to stand from chair/ improve upright posture/ decrease low back pain.     OBJECTIVE:   PATIENT SURVEYS:  FOTO initial 57/ goal 49  SCREENING FOR RED FLAGS: Bowel or bladder incontinence: No Spinal tumors: No Cauda equina syndrome: No Compression fracture: No Abdominal aneurysm: No  COGNITION:  Overall cognitive status: Within functional limits for tasks assessed   SENSATION: Not tested  POSTURE: rounded shoulders, forward head, and increased thoracic kyphosis  PALPATION: Tenderness over lumbar paraspinals.   LUMBAR ROM:   Active  A/PROM  eval  Flexion 50% limited  Extension 75%  Right lateral flexion 50%  Left lateral flexion 50%  Right rotation 50%  Left rotation 50%   (Blank rows = not tested)  LOWER EXTREMITY ROM:     B hip flexion/ knee extension limited.    LOWER EXTREMITY MMT:    B LE muscle strength grossly 4/5 MMT except hamstring 4+/5 MMT  5xSTS: 58sec. From 23" blue mat table (challenged).    Ascends steps with L LE and heavy UE assist.   GAIT: Distance walked: In clinic Assistive device utilized: Walker - 2 wheeled  Elevated forearm support Level of assistance: Modified independence Comments: Significant forward head/ flexed posture/ thoracic kyphosis   TODAY'S TREATMENT    03/08/22:   Subjective:  Pt. Entered PT with RW.  No new issues.     There.ex:    Nustep L4 for 10 min. B UE/LE.  Discussed upcoming Christmas weekend.      3# ankle wts.: LAQ/ marching/ forward/ backwards/ lateral walking 3 laps each.  Heavy UE assist required.  6" step touches (cuing to use less UE assist).     Stairs (step to gait pattern) with B handrails 4 stairs x 2.  Pt. Able to ascend with L and R LE.  Extra time/ focus with R LE step ups.   O2 sat: 95%.    Nautilus (with handles): seated lat. Pull downs 50# 30x each.  Chair behind pt. For safety.   Reviewed HEP/ gym exercises.      Walking in clinic/ gym and out to car with RW and cuing to increase upright posture/ head position.  No LOB/ cuing to correct head/ upright posture.    PATIENT EDUCATION:  Education details: HEP/ upright posture/ Sit stands Person educated: Patient Education method: Customer service manager Education comprehension: verbalized understanding and returned demonstration   HOME EXERCISE PROGRAM: Exercises at [employer] use of recumbent bike.    ASSESSMENT:  CLINICAL IMPRESSION: Pt. Continues to require heavy UE assist at stairs and with RW for all mobility tasks.  Pt focused on maintaining pt. BOS and decreasing use of UE with standing/ wt. Shifting. No LOB during tx and no c/o low back symptoms. Moderate fatigue during tx. And pts. O2 sat. Maintaining >95% during tx.  Pt. Will benefit from skilled PT services in combination with gym based ex. To improve LE strengthening/ pain-free mobility.     OBJECTIVE IMPAIRMENTS Abnormal gait, cardiopulmonary status limiting activity, decreased activity tolerance, decreased balance, decreased coordination, decreased endurance, decreased mobility, difficulty walking, decreased ROM, decreased strength, impaired flexibility, improper body mechanics, postural dysfunction, and pain.   ACTIVITY LIMITATIONS carrying, lifting, bending, standing, squatting, transfers, bed mobility, bathing, toileting, dressing, and locomotion level  PARTICIPATION LIMITATIONS: meal prep, cleaning, laundry, driving, shopping, and community activity  PERSONAL FACTORS Age, Fitness, Past/current experiences, and  3+ comorbidities:      REHAB POTENTIAL: Good  CLINICAL DECISION MAKING: Evolving/moderate complexity  EVALUATION COMPLEXITY: Moderate   GOALS: Goals reviewed with patient? Yes  SHORT TERM GOALS: Target date: 02/23/22  Pt. Will be independent with HEP to increase LE muscle strength 1/2 muscle grade to improve transfers/ safety with gait.  Baseline:  see above.  11/10: B hip strength grossly 4/5 MMT and quad/hamstring 4+/5 MMT Goal status: Partially met   LONG TERM GOALS: Target date: 04/20/22  Pt. Will increase FOTO to 63 to improve pain-free mobility.   Baseline: initial FOTO 57.  10/16: 54 (no improvement)- limited with mobility.  Goal status: Not met  2.  Pt. Able to stand from 21# chair height with no UE assist to improve transfers/ independence with daily activity.   Baseline:  Goal status: Not met  3.  Pt. Will decrease 5xSTS to <30 sec. To improve functional mobility/ decrease fall risk.  Baseline: 58sec. From 23" blue mat table (challenged).  10/16: gray chair with UE assist: 19.7 seconds Goal status: Partially met   PLAN: PT FREQUENCY: 1-2x/week  PT DURATION: 12 weeks  PLANNED INTERVENTIONS: Therapeutic exercises, Therapeutic activity, Neuromuscular re-education, Balance training, Gait training, Patient/Family education, Self Care, Joint mobilization, Stair training, Aquatic Therapy, Moist heat, Manual therapy, and Re-evaluation.  PLAN FOR NEXT SESSION: Reassess vitals/ increase walking endurance.  Pura Spice, PT, DPT # [phone] 03/08/2022, 10:25 AM

## 2022-03-15 ENCOUNTER — Ambulatory Visit: Payer: Medicare HMO | Admitting: Physical Therapy

## 2022-03-15 DIAGNOSIS — R293 Abnormal posture: Secondary | ICD-10-CM

## 2022-03-15 DIAGNOSIS — R269 Unspecified abnormalities of gait and mobility: Secondary | ICD-10-CM

## 2022-03-15 DIAGNOSIS — R2689 Other abnormalities of gait and mobility: Secondary | ICD-10-CM | POA: Diagnosis not present

## 2022-03-15 DIAGNOSIS — M6281 Muscle weakness (generalized): Secondary | ICD-10-CM | POA: Diagnosis not present

## 2022-03-15 DIAGNOSIS — M5459 Other low back pain: Secondary | ICD-10-CM | POA: Diagnosis not present

## 2022-03-15 NOTE — Therapy (Signed)
OUTPATIENT PHYSICAL THERAPY THORACOLUMBAR TREATMENT  Patient Name: Hannah Powell MRN: [id number] DOB:1945/04/20, 76 y.o., female Today's Date: 03/15/2022   PT End of Session -     Visit Number 36 of 19   04/20/2022   PT Start Time 1030 to 1120  (50 minutes)   Activity Tolerance Patient tolerated treatment well    Behavior During Therapy [facility] for tasks assessed/performed          Past Medical History:  Diagnosis Date   Anxiety    Arthritis    CHF (congestive heart failure) (HCC)    COPD (chronic obstructive pulmonary disease) ([place])    Dysplastic nevus 04/22/2019   R abdomen parallel to sup umbilicus - mod   Dysplastic nevus 04/22/2019   R low back above waistline - mild    Edema    feet/legs   Hypertension    dr Otho Najjar      Hypothyroidism    Leg weakness, bilateral    Osteoporosis    Oxygen deficit    2l with bipap at night   RLS (restless legs syndrome)    Shortness of breath    Sleep apnea    bipap   Wheezing    Past Surgical History:  Procedure Laterality Date   BACK SURGERY     cervical   BREAST BIOPSY Left    needle bx-neg   CATARACT EXTRACTION W/PHACO Left 05/07/2017   Procedure: CATARACT EXTRACTION PHACO AND INTRAOCULAR LENS PLACEMENT ([place]);  Surgeon: Birder Robson, MD;  Location: [facility];  Service: Ophthalmology;  Laterality: Left;  [place] 00:48.7 AP% 11.3 CDE 5.46 Fluid Pack Lot # [id number] H   CATARACT EXTRACTION W/PHACO Right 04/23/2017   Procedure: CATARACT EXTRACTION PHACO AND INTRAOCULAR LENS PLACEMENT ([facility]);  Surgeon: Birder Robson, MD;  Location: [facility];  Service: Ophthalmology;  Laterality: Right;  [place] 00:29 AP% 12.8 CDE 3.79 FLUID PACK LOT # [id number]   FOOT ARTHROPLASTY     JOINT REPLACEMENT     x2 tkr   TOTAL SHOULDER ARTHROPLASTY Left 07/02/2013   Procedure: LEFT TOTAL SHOULDER ARTHROPLASTY;  Surgeon: Marin Shutter, MD;  Location: [place];  Service: Orthopedics;  Laterality: Left;   TOTAL SHOULDER ARTHROPLASTY Right 04/10/2018    Procedure: TOTAL REVERSE SHOULDER ARTHROPLASTY;  Surgeon: Justice Britain, MD;  Location: [facility];  Service: Orthopedics;  Laterality: Right;  124mn   Patient Active Problem List   Diagnosis Date Noted   Restless leg syndrome 02/08/2022   Hypothyroidism 02/08/2022   Obesity (BMI 30-39.9) 02/08/2022   Insomnia 02/08/2022   Anxiety 02/08/2022   Poor appetite 02/08/2022   S/P reverse total shoulder arthroplasty, 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   Chronic diastolic heart failure ([place] 08/09/2016   COPD (chronic obstructive pulmonary disease) ([facility] 08/09/2016   Pulmonary infiltrates    Pneumonia due to Streptococcus pneumoniae (HCC)    Fever    Community acquired pneumonia of right lung    Palliative care by specialist    Goals of care, counseling/discussion    Pressure injury of skin 07/03/2016   Acute respiratory failure with hypoxia ([facility] 07/02/2016   COPD exacerbation ([facility] 07/21/2015   Acute renal insufficiency 07/21/2015   Hyperglycemia 07/21/2015   HTN (hypertension) 07/21/2015   S/P shoulder replacement 07/02/2013    PCP: KAdin Hector MD  REFERRING PROVIDER: KAdin Hector MD  REFERRING DIAG: Low back pain  Rationale for Evaluation and Treatment Rehabilitation  THERAPY DIAG:  Muscle weakness (generalized)  Gait difficulty  Other low back pain  Abnormal  posture  Imbalance  ONSET DATE: 03/20/2019  SUBJECTIVE:                                                                                                                                                                                           SUBJECTIVE STATEMENT:  EVALUATION Pt. Reports chronic h/o lumbar stenosis/ back pain.  Pt. Reports 8/10 low back pain in morning and uses heating pad/ ibuprofen to decrease back pain to 1/10 after about 2 hours.  Pt. Has returned to [employer] and aquatic ex. Recently.    PERTINENT HISTORY:  See MD notes.    PAIN:  Are you having pain? Yes: NPRS scale: 1-2/10 Pain  location: low back Pain description: aching/ stiff Aggravating factors: prolonged activity Relieving factors: heat/ ibuprofen   PRECAUTIONS: Fall  WEIGHT BEARING RESTRICTIONS No  FALLS:  Has patient fallen in last 6 months? No  LIVING ENVIRONMENT: Lives with: lives with their spouse Lives in: House/apartment  OCCUPATION: Retired  PLOF: Independent with household mobility with device  PATIENT GOALS Increase ability to stand from chair/ improve upright posture/ decrease low back pain.     OBJECTIVE:   PATIENT SURVEYS:  FOTO initial 57/ goal 52  SCREENING FOR RED FLAGS: Bowel or bladder incontinence: No Spinal tumors: No Cauda equina syndrome: No Compression fracture: No Abdominal aneurysm: No  COGNITION:  Overall cognitive status: Within functional limits for tasks assessed   SENSATION: Not tested  POSTURE: rounded shoulders, forward head, and increased thoracic kyphosis  PALPATION: Tenderness over lumbar paraspinals.   LUMBAR ROM:   Active  A/PROM  eval  Flexion 50% limited  Extension 75%  Right lateral flexion 50%  Left lateral flexion 50%  Right rotation 50%  Left rotation 50%   (Blank rows = not tested)  LOWER EXTREMITY ROM:     B hip flexion/ knee extension limited.    LOWER EXTREMITY MMT:    B LE muscle strength grossly 4/5 MMT except hamstring 4+/5 MMT  5xSTS: 58sec. From 23" blue mat table (challenged).    Ascends steps with L LE and heavy UE assist.   GAIT: Distance walked: In clinic Assistive device utilized: Walker - 2 wheeled  Elevated forearm support Level of assistance: Modified independence Comments: Significant forward head/ flexed posture/ thoracic kyphosis   TODAY'S TREATMENT    03/15/22:   Subjective:  Pt. Entered PT with RW.  No new issues.  Pt. Went to gym 2x and did some walking over the Christmas weekend.     There.ex:    Nustep L5 for 10 min. B UE/LE.  Discussed Christmas weekend.  4# ankle wts.: LAQ/  marching/ forward/ backwards/ lateral walking 3 laps each.  Heavy UE assist required.  6" step touches/ups in //-bars (cuing to use less UE assist).     Nautilus (with handles): seated lat. Pull downs 50# 30x each.  Standing tricep extension 40# 30x (fatigue).  Chair behind pt. For safety.   Reviewed HEP/ gym exercises.      Walking in clinic/ gym and out to car with RW and cuing to increase upright posture/ head position.  No LOB/ cuing to correct head/ upright posture.    PATIENT EDUCATION:  Education details: HEP/ upright posture/ Sit stands Person educated: Patient Education method: Customer service manager Education comprehension: verbalized understanding and returned demonstration   HOME EXERCISE PROGRAM: Exercises at [employer] use of recumbent bike.    ASSESSMENT:  CLINICAL IMPRESSION: Pt. Continues to require heavy UE assist at stairs and with RW for all mobility tasks.  Pt focused on maintaining pt. BOS and decreasing use of UE with standing/ wt. Shifting. No LOB during tx and no c/o low back symptoms. Moderate fatigue during tx. And pts. O2 sat. Maintaining >95% during tx.  Pt. Will benefit from skilled PT services in combination with gym based ex. To improve LE strengthening/ pain-free mobility.     OBJECTIVE IMPAIRMENTS Abnormal gait, cardiopulmonary status limiting activity, decreased activity tolerance, decreased balance, decreased coordination, decreased endurance, decreased mobility, difficulty walking, decreased ROM, decreased strength, impaired flexibility, improper body mechanics, postural dysfunction, and pain.   ACTIVITY LIMITATIONS carrying, lifting, bending, standing, squatting, transfers, bed mobility, bathing, toileting, dressing, and locomotion level  PARTICIPATION LIMITATIONS: meal prep, cleaning, laundry, driving, shopping, and community activity  PERSONAL FACTORS Age, Fitness, Past/current experiences, and 3+ comorbidities:      REHAB  POTENTIAL: Good  CLINICAL DECISION MAKING: Evolving/moderate complexity  EVALUATION COMPLEXITY: Moderate   GOALS: Goals reviewed with patient? Yes  SHORT TERM GOALS: Target date: 02/23/22  Pt. Will be independent with HEP to increase LE muscle strength 1/2 muscle grade to improve transfers/ safety with gait.  Baseline:  see above.  11/10: B hip strength grossly 4/5 MMT and quad/hamstring 4+/5 MMT Goal status: Partially met   LONG TERM GOALS: Target date: 04/20/22  Pt. Will increase FOTO to 63 to improve pain-free mobility.   Baseline: initial FOTO 57.  10/16: 54 (no improvement)- limited with mobility.  Goal status: Not met  2.  Pt. Able to stand from 21# chair height with no UE assist to improve transfers/ independence with daily activity.   Baseline:  Goal status: Not met  3.  Pt. Will decrease 5xSTS to <30 sec. To improve functional mobility/ decrease fall risk.  Baseline: 58sec. From 23" blue mat table (challenged).  10/16: gray chair with UE assist: 19.7 seconds Goal status: Partially met   PLAN: PT FREQUENCY: 1-2x/week  PT DURATION: 12 weeks  PLANNED INTERVENTIONS: Therapeutic exercises, Therapeutic activity, Neuromuscular re-education, Balance training, Gait training, Patient/Family education, Self Care, Joint mobilization, Stair training, Aquatic Therapy, Moist heat, Manual therapy, and Re-evaluation.  PLAN FOR NEXT SESSION: Reassess vitals/ increase walking endurance.  Pura Spice, PT, DPT # [phone] 03/15/2022, 11:29 AM

## 2022-03-22 ENCOUNTER — Ambulatory Visit
Admission: RE | Admit: 2022-03-22 | Discharge: 2022-03-22 | Disposition: A | Discharge status: Home / Self Care | Payer: Medicare HMO | Source: Ambulatory Visit | Attending: Internal Medicine | Admitting: Internal Medicine

## 2022-03-22 ENCOUNTER — Other Ambulatory Visit: Payer: Self-pay | Admitting: Internal Medicine

## 2022-03-22 DIAGNOSIS — G63 Polyneuropathy in diseases classified elsewhere: Secondary | ICD-10-CM | POA: Diagnosis not present

## 2022-03-22 DIAGNOSIS — I5032 Chronic diastolic (congestive) heart failure: Secondary | ICD-10-CM | POA: Diagnosis not present

## 2022-03-22 DIAGNOSIS — N183 Chronic kidney disease, stage 3 unspecified: Secondary | ICD-10-CM | POA: Diagnosis not present

## 2022-03-22 DIAGNOSIS — M79604 Pain in right leg: Secondary | ICD-10-CM | POA: Insufficient documentation

## 2022-03-22 DIAGNOSIS — J9611 Chronic respiratory failure with hypoxia: Secondary | ICD-10-CM | POA: Diagnosis not present

## 2022-03-22 DIAGNOSIS — J449 Chronic obstructive pulmonary disease, unspecified: Secondary | ICD-10-CM | POA: Diagnosis not present

## 2022-03-23 ENCOUNTER — Ambulatory Visit: Payer: Medicare HMO | Admitting: Physical Therapy

## 2022-03-26 DIAGNOSIS — J449 Chronic obstructive pulmonary disease, unspecified: Secondary | ICD-10-CM | POA: Diagnosis not present

## 2022-03-28 ENCOUNTER — Ambulatory Visit: Payer: Medicare HMO | Admitting: Dermatology

## 2022-03-29 ENCOUNTER — Inpatient Hospital Stay
Admission: EM | Admit: 2022-03-29 | Discharge: 2022-03-31 | DRG: 193 | Disposition: A | Discharge status: Home / Self Care | Payer: Medicare HMO | Attending: Internal Medicine | Admitting: Internal Medicine

## 2022-03-29 ENCOUNTER — Emergency Department: Payer: Medicare HMO

## 2022-03-29 ENCOUNTER — Other Ambulatory Visit: Payer: Self-pay

## 2022-03-29 ENCOUNTER — Encounter: Payer: Medicare HMO | Admitting: Physical Therapy

## 2022-03-29 ENCOUNTER — Other Ambulatory Visit: Payer: Self-pay | Admitting: Internal Medicine

## 2022-03-29 ENCOUNTER — Inpatient Hospital Stay
Admission: RE | Admit: 2022-03-29 | Discharge: 2022-03-29 | Disposition: A | Discharge status: Home / Self Care | Payer: Medicare HMO | Source: Ambulatory Visit | Attending: Internal Medicine | Admitting: Internal Medicine

## 2022-03-29 DIAGNOSIS — R0602 Shortness of breath: Secondary | ICD-10-CM

## 2022-03-29 DIAGNOSIS — Z961 Presence of intraocular lens: Secondary | ICD-10-CM | POA: Diagnosis present

## 2022-03-29 DIAGNOSIS — Z1152 Encounter for screening for COVID-19: Secondary | ICD-10-CM

## 2022-03-29 DIAGNOSIS — M1990 Unspecified osteoarthritis, unspecified site: Secondary | ICD-10-CM | POA: Diagnosis present

## 2022-03-29 DIAGNOSIS — E039 Hypothyroidism, unspecified: Secondary | ICD-10-CM | POA: Diagnosis present

## 2022-03-29 DIAGNOSIS — L03115 Cellulitis of right lower limb: Secondary | ICD-10-CM | POA: Diagnosis not present

## 2022-03-29 DIAGNOSIS — J439 Emphysema, unspecified: Secondary | ICD-10-CM | POA: Diagnosis present

## 2022-03-29 DIAGNOSIS — I5033 Acute on chronic diastolic (congestive) heart failure: Secondary | ICD-10-CM | POA: Diagnosis present

## 2022-03-29 DIAGNOSIS — Z87891 Personal history of nicotine dependence: Secondary | ICD-10-CM | POA: Diagnosis not present

## 2022-03-29 DIAGNOSIS — J441 Chronic obstructive pulmonary disease with (acute) exacerbation: Secondary | ICD-10-CM | POA: Diagnosis not present

## 2022-03-29 DIAGNOSIS — Z96612 Presence of left artificial shoulder joint: Secondary | ICD-10-CM | POA: Diagnosis present

## 2022-03-29 DIAGNOSIS — M810 Age-related osteoporosis without current pathological fracture: Secondary | ICD-10-CM | POA: Diagnosis present

## 2022-03-29 DIAGNOSIS — Z79899 Other long term (current) drug therapy: Secondary | ICD-10-CM

## 2022-03-29 DIAGNOSIS — E669 Obesity, unspecified: Secondary | ICD-10-CM | POA: Diagnosis present

## 2022-03-29 DIAGNOSIS — J440 Chronic obstructive pulmonary disease with acute lower respiratory infection: Secondary | ICD-10-CM | POA: Diagnosis present

## 2022-03-29 DIAGNOSIS — J9622 Acute and chronic respiratory failure with hypercapnia: Secondary | ICD-10-CM | POA: Diagnosis present

## 2022-03-29 DIAGNOSIS — E8729 Other acidosis: Secondary | ICD-10-CM | POA: Diagnosis not present

## 2022-03-29 DIAGNOSIS — R079 Chest pain, unspecified: Secondary | ICD-10-CM | POA: Diagnosis not present

## 2022-03-29 DIAGNOSIS — I110 Hypertensive heart disease with heart failure: Secondary | ICD-10-CM | POA: Diagnosis present

## 2022-03-29 DIAGNOSIS — E662 Morbid (severe) obesity with alveolar hypoventilation: Secondary | ICD-10-CM | POA: Diagnosis present

## 2022-03-29 DIAGNOSIS — Z9981 Dependence on supplemental oxygen: Secondary | ICD-10-CM

## 2022-03-29 DIAGNOSIS — M7989 Other specified soft tissue disorders: Secondary | ICD-10-CM

## 2022-03-29 DIAGNOSIS — J189 Pneumonia, unspecified organism: Principal | ICD-10-CM | POA: Diagnosis present

## 2022-03-29 DIAGNOSIS — R918 Other nonspecific abnormal finding of lung field: Secondary | ICD-10-CM | POA: Diagnosis not present

## 2022-03-29 DIAGNOSIS — Z79890 Hormone replacement therapy (postmenopausal): Secondary | ICD-10-CM

## 2022-03-29 DIAGNOSIS — Z9842 Cataract extraction status, left eye: Secondary | ICD-10-CM | POA: Diagnosis not present

## 2022-03-29 DIAGNOSIS — Z9841 Cataract extraction status, right eye: Secondary | ICD-10-CM

## 2022-03-29 DIAGNOSIS — Z833 Family history of diabetes mellitus: Secondary | ICD-10-CM

## 2022-03-29 DIAGNOSIS — R0600 Dyspnea, unspecified: Secondary | ICD-10-CM

## 2022-03-29 DIAGNOSIS — G2581 Restless legs syndrome: Secondary | ICD-10-CM | POA: Diagnosis not present

## 2022-03-29 DIAGNOSIS — I10 Essential (primary) hypertension: Secondary | ICD-10-CM | POA: Diagnosis present

## 2022-03-29 DIAGNOSIS — Z981 Arthrodesis status: Secondary | ICD-10-CM

## 2022-03-29 DIAGNOSIS — I700 Atherosclerosis of aorta: Secondary | ICD-10-CM | POA: Diagnosis present

## 2022-03-29 DIAGNOSIS — R0603 Acute respiratory distress: Secondary | ICD-10-CM | POA: Diagnosis not present

## 2022-03-29 DIAGNOSIS — I5032 Chronic diastolic (congestive) heart failure: Secondary | ICD-10-CM | POA: Diagnosis present

## 2022-03-29 DIAGNOSIS — Z96611 Presence of right artificial shoulder joint: Secondary | ICD-10-CM | POA: Diagnosis present

## 2022-03-29 DIAGNOSIS — Z8249 Family history of ischemic heart disease and other diseases of the circulatory system: Secondary | ICD-10-CM

## 2022-03-29 DIAGNOSIS — Z713 Dietary counseling and surveillance: Secondary | ICD-10-CM

## 2022-03-29 DIAGNOSIS — J9621 Acute and chronic respiratory failure with hypoxia: Secondary | ICD-10-CM | POA: Diagnosis not present

## 2022-03-29 DIAGNOSIS — Z6839 Body mass index (BMI) 39.0-39.9, adult: Secondary | ICD-10-CM

## 2022-03-29 DIAGNOSIS — F419 Anxiety disorder, unspecified: Secondary | ICD-10-CM | POA: Diagnosis present

## 2022-03-29 DIAGNOSIS — Z7951 Long term (current) use of inhaled steroids: Secondary | ICD-10-CM

## 2022-03-29 LAB — BASIC METABOLIC PANEL
Anion gap: 7 (ref 5–15)
BUN: 18 mg/dL (ref 8–23)
CO2: 31 mmol/L (ref 22–32)
Calcium: 9 mg/dL (ref 8.9–10.3)
Chloride: 102 mmol/L (ref 98–111)
Creatinine, Ser: 0.7 mg/dL (ref 0.44–1.00)
GFR, Estimated: >60 mL/min (ref >60)
Glucose, Bld: 104 mg/dL — ABNORMAL HIGH (ref 70–99)
Potassium: 4 mmol/L (ref 3.5–5.1)
Sodium: 140 mmol/L (ref 135–145)

## 2022-03-29 LAB — BLOOD GAS, VENOUS
Acid-Base Excess: 4.8 mmol/L — ABNORMAL HIGH (ref 0.0–2.0)
Bicarbonate: 35.6 mmol/L — ABNORMAL HIGH (ref 20.0–28.0)
O2 Saturation: 42.9 %
Patient temperature: 37
pCO2, Ven: 87 mmHg (ref 44–60)
pH, Ven: 7.22 — ABNORMAL LOW (ref 7.25–7.43)
pO2, Ven: 37 mmHg (ref 32–45)

## 2022-03-29 LAB — BRAIN NATRIURETIC PEPTIDE: B Natriuretic Peptide: 234.4 pg/mL — ABNORMAL HIGH (ref 0.0–100.0)

## 2022-03-29 LAB — CBC
HCT: 47.2 % — ABNORMAL HIGH (ref 36.0–46.0)
Hemoglobin: 14.1 g/dL (ref 12.0–15.0)
MCH: 30.8 pg (ref 26.0–34.0)
MCHC: 29.9 g/dL — ABNORMAL LOW (ref 30.0–36.0)
MCV: 103.1 fL — ABNORMAL HIGH (ref 80.0–100.0)
Platelets: 165 10*3/uL (ref 150–400)
RBC: 4.58 MIL/uL (ref 3.87–5.11)
RDW: 13.2 % (ref 11.5–15.5)
WBC: 8 10*3/uL (ref 4.0–10.5)
nRBC: 0 % (ref 0.0–0.2)

## 2022-03-29 LAB — MRSA NEXT GEN BY PCR, NASAL: MRSA by PCR Next Gen: NOT DETECTED

## 2022-03-29 LAB — SEDIMENTATION RATE: Sed Rate: 12 mm/hr (ref 0–30)

## 2022-03-29 LAB — RESP PANEL BY RT-PCR (RSV, FLU A&B, COVID)  RVPGX2
Influenza A by PCR: NEGATIVE
Influenza B by PCR: NEGATIVE
Resp Syncytial Virus by PCR: NEGATIVE
SARS Coronavirus 2 by RT PCR: NEGATIVE

## 2022-03-29 LAB — TROPONIN I (HIGH SENSITIVITY): Troponin I (High Sensitivity): 6 ng/L (ref <18)

## 2022-03-29 LAB — LACTIC ACID, PLASMA
Lactic Acid, Venous: 0.7 mmol/L (ref 0.5–1.9)
Lactic Acid, Venous: 0.9 mmol/L (ref 0.5–1.9)

## 2022-03-29 LAB — PROCALCITONIN: Procalcitonin: <0.1 ng/mL

## 2022-03-29 LAB — C-REACTIVE PROTEIN: CRP: 1.1 mg/dL — ABNORMAL HIGH (ref <1.0)

## 2022-03-29 MED ORDER — LOSARTAN POTASSIUM 25 MG PO TABS
25.0000 mg | ORAL_TABLET | Freq: Every day | ORAL | Status: DC
  Administered 2022-03-30 – 2022-03-31 (×2): 25 mg via ORAL
  Filled 2022-03-29 (×2): qty 1

## 2022-03-29 MED ORDER — CALCIUM CARBONATE 1250 (500 CA) MG PO TABS
1250.0000 mg | ORAL_TABLET | Freq: Every day | ORAL | Status: DC
  Administered 2022-03-30 – 2022-03-31 (×2): 1250 mg via ORAL
  Filled 2022-03-29 (×2): qty 1

## 2022-03-29 MED ORDER — ALPRAZOLAM 0.25 MG PO TABS: 0.2500 mg | ORAL_TABLET | Freq: Three times a day (TID) | ORAL | Status: DC | PRN

## 2022-03-29 MED ORDER — SPIRONOLACTONE 25 MG PO TABS
25.0000 mg | ORAL_TABLET | Freq: Two times a day (BID) | ORAL | Status: DC
  Administered 2022-03-29 – 2022-03-31 (×4): 25 mg via ORAL
  Filled 2022-03-29 (×4): qty 1

## 2022-03-29 MED ORDER — ALBUTEROL SULFATE (2.5 MG/3ML) 0.083% IN NEBU: 2.5000 mg | INHALATION_SOLUTION | RESPIRATORY_TRACT | Status: DC | PRN

## 2022-03-29 MED ORDER — OXYBUTYNIN CHLORIDE 5 MG PO TABS
5.0000 mg | ORAL_TABLET | Freq: Three times a day (TID) | ORAL | Status: DC
  Administered 2022-03-29 – 2022-03-31 (×5): 5 mg via ORAL
  Filled 2022-03-29 (×6): qty 1

## 2022-03-29 MED ORDER — IPRATROPIUM-ALBUTEROL 0.5-2.5 (3) MG/3ML IN SOLN
3.0000 mL | Freq: Two times a day (BID) | RESPIRATORY_TRACT | Status: DC
  Administered 2022-03-30 – 2022-03-31 (×3): 3 mL via RESPIRATORY_TRACT
  Filled 2022-03-29 (×2): qty 3

## 2022-03-29 MED ORDER — FERROUS SULFATE 325 (65 FE) MG PO TABS
325.0000 mg | ORAL_TABLET | Freq: Every day | ORAL | Status: DC
  Administered 2022-03-30 – 2022-03-31 (×2): 325 mg via ORAL
  Filled 2022-03-29 (×2): qty 1

## 2022-03-29 MED ORDER — MODAFINIL 100 MG PO TABS
100.0000 mg | ORAL_TABLET | Freq: Every day | ORAL | Status: DC
  Administered 2022-03-30 – 2022-03-31 (×2): 100 mg via ORAL
  Filled 2022-03-29 (×2): qty 1

## 2022-03-29 MED ORDER — PSEUDOEPHEDRINE HCL ER 120 MG PO TB12
120.0000 mg | ORAL_TABLET | Freq: Two times a day (BID) | ORAL | Status: DC | PRN
  Administered 2022-03-30: 120 mg via ORAL
  Filled 2022-03-29 (×2): qty 1

## 2022-03-29 MED ORDER — OMEGA-3-ACID ETHYL ESTERS 1 G PO CAPS
1.0000 | ORAL_CAPSULE | Freq: Every day | ORAL | Status: DC
  Administered 2022-03-30 – 2022-03-31 (×2): 1 g via ORAL
  Filled 2022-03-29 (×2): qty 1

## 2022-03-29 MED ORDER — METHYLPREDNISOLONE SODIUM SUCC 125 MG IJ SOLR
125.0000 mg | Freq: Once | INTRAMUSCULAR | Status: AC
  Administered 2022-03-29: 125 mg via INTRAVENOUS
  Filled 2022-03-29: qty 2

## 2022-03-29 MED ORDER — DM-GUAIFENESIN ER 30-600 MG PO TB12: 1.0000 | ORAL_TABLET | Freq: Two times a day (BID) | ORAL | Status: DC | PRN

## 2022-03-29 MED ORDER — LORATADINE 10 MG PO TABS: 10.0000 mg | ORAL_TABLET | Freq: Every day | ORAL | Status: DC | PRN

## 2022-03-29 MED ORDER — IPRATROPIUM-ALBUTEROL 0.5-2.5 (3) MG/3ML IN SOLN
3.0000 mL | Freq: Once | RESPIRATORY_TRACT | Status: AC
  Administered 2022-03-29: 3 mL via RESPIRATORY_TRACT
  Filled 2022-03-29: qty 3

## 2022-03-29 MED ORDER — ONDANSETRON HCL 4 MG/2ML IJ SOLN
4.0000 mg | Freq: Three times a day (TID) | INTRAMUSCULAR | Status: DC | PRN
  Administered 2022-03-29: 4 mg via INTRAVENOUS
  Filled 2022-03-29: qty 2

## 2022-03-29 MED ORDER — SODIUM CHLORIDE 0.9 % IV SOLN: 1.0000 g | INTRAVENOUS | Status: DC

## 2022-03-29 MED ORDER — TOPIRAMATE 100 MG PO TABS
100.0000 mg | ORAL_TABLET | Freq: Every day | ORAL | Status: DC
  Administered 2022-03-30 – 2022-03-31 (×2): 100 mg via ORAL
  Filled 2022-03-29 (×2): qty 1

## 2022-03-29 MED ORDER — HYDRALAZINE HCL 20 MG/ML IJ SOLN: 5.0000 mg | INTRAMUSCULAR | Status: DC | PRN

## 2022-03-29 MED ORDER — SODIUM CHLORIDE 0.9 % IV SOLN
1.0000 g | INTRAVENOUS | Status: DC
  Administered 2022-03-29 – 2022-03-30 (×2): 1 g via INTRAVENOUS
  Filled 2022-03-29: qty 10
  Filled 2022-03-29: qty 1
  Filled 2022-03-29: qty 10

## 2022-03-29 MED ORDER — LORATADINE-PSEUDOEPHEDRINE ER 10-240 MG PO TB24: 1.0000 | ORAL_TABLET | Freq: Every day | ORAL | Status: DC | PRN

## 2022-03-29 MED ORDER — KETOCONAZOLE 2 % EX CREA: TOPICAL_CREAM | Freq: Every day | CUTANEOUS | Status: DC | PRN

## 2022-03-29 MED ORDER — B COMPLEX-C PO TABS
1.0000 | ORAL_TABLET | Freq: Every day | ORAL | Status: DC
  Administered 2022-03-30 – 2022-03-31 (×2): 1 via ORAL
  Filled 2022-03-29 (×2): qty 1

## 2022-03-29 MED ORDER — ENOXAPARIN SODIUM 40 MG/0.4ML IJ SOSY
40.0000 mg | PREFILLED_SYRINGE | INTRAMUSCULAR | Status: DC
  Administered 2022-03-29: 40 mg via SUBCUTANEOUS
  Filled 2022-03-29: qty 0.4

## 2022-03-29 MED ORDER — SODIUM CHLORIDE 0.9 % IV SOLN
100.0000 mg | Freq: Two times a day (BID) | INTRAVENOUS | Status: DC
  Administered 2022-03-29 – 2022-03-31 (×4): 100 mg via INTRAVENOUS
  Filled 2022-03-29 (×6): qty 100

## 2022-03-29 MED ORDER — LEVOTHYROXINE SODIUM 50 MCG PO TABS
250.0000 ug | ORAL_TABLET | Freq: Every day | ORAL | Status: DC
  Administered 2022-03-30 – 2022-03-31 (×2): 250 ug via ORAL
  Filled 2022-03-29 (×2): qty 1

## 2022-03-29 MED ORDER — CHLORHEXIDINE GLUCONATE CLOTH 2 % EX PADS
6.0000 | MEDICATED_PAD | Freq: Every day | CUTANEOUS | Status: DC
  Administered 2022-03-29 – 2022-03-30 (×2): 6 via TOPICAL

## 2022-03-29 MED ORDER — ZOLPIDEM TARTRATE 5 MG PO TABS
5.0000 mg | ORAL_TABLET | Freq: Every evening | ORAL | Status: DC | PRN
  Administered 2022-03-30 – 2022-03-31 (×2): 5 mg via ORAL
  Filled 2022-03-29 (×2): qty 1

## 2022-03-29 MED ORDER — PRAMIPEXOLE DIHYDROCHLORIDE 1 MG PO TABS
4.0000 mg | ORAL_TABLET | Freq: Every day | ORAL | Status: DC
  Administered 2022-03-29 – 2022-03-30 (×2): 4 mg via ORAL
  Filled 2022-03-29 (×2): qty 4

## 2022-03-29 MED ORDER — TRAMADOL HCL 50 MG PO TABS: 50.0000 mg | ORAL_TABLET | Freq: Three times a day (TID) | ORAL | Status: DC | PRN

## 2022-03-29 MED ORDER — ADULT MULTIVITAMIN W/MINERALS CH
1.0000 | ORAL_TABLET | Freq: Every day | ORAL | Status: DC
  Administered 2022-03-29 – 2022-03-31 (×3): 1 via ORAL
  Filled 2022-03-29 (×3): qty 1

## 2022-03-29 MED ORDER — IOHEXOL 350 MG/ML SOLN
75.0000 mL | Freq: Once | INTRAVENOUS | Status: AC | PRN
  Administered 2022-03-29: 75 mL via INTRAVENOUS

## 2022-03-29 MED ORDER — FUROSEMIDE 10 MG/ML IJ SOLN
80.0000 mg | Freq: Once | INTRAMUSCULAR | Status: AC
  Administered 2022-03-29: 80 mg via INTRAVENOUS
  Filled 2022-03-29: qty 8

## 2022-03-29 MED ORDER — IPRATROPIUM-ALBUTEROL 0.5-2.5 (3) MG/3ML IN SOLN
3.0000 mL | RESPIRATORY_TRACT | Status: DC
  Administered 2022-03-29: 3 mL via RESPIRATORY_TRACT
  Filled 2022-03-29: qty 3

## 2022-03-29 MED ORDER — ACETAMINOPHEN 325 MG PO TABS: 650.0000 mg | ORAL_TABLET | Freq: Four times a day (QID) | ORAL | Status: DC | PRN

## 2022-03-29 MED ORDER — FUROSEMIDE 40 MG PO TABS
80.0000 mg | ORAL_TABLET | Freq: Two times a day (BID) | ORAL | Status: DC
  Administered 2022-03-30 – 2022-03-31 (×3): 80 mg via ORAL
  Filled 2022-03-29: qty 2
  Filled 2022-03-29 (×2): qty 4

## 2022-03-29 MED ORDER — METHYLPREDNISOLONE SODIUM SUCC 40 MG IJ SOLR
40.0000 mg | Freq: Two times a day (BID) | INTRAMUSCULAR | Status: DC
  Administered 2022-03-30 – 2022-03-31 (×3): 40 mg via INTRAVENOUS
  Filled 2022-03-29 (×3): qty 1
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
## 2022-03-29 NOTE — ED Triage Notes (Addendum)
Pt presents to ED with c/o of hypoxia during a outpatient CT. Pt states HX of COPD and wears 2L/min via [place] only at night. Pt was at outpt CT to rule out out PE. Concerns for PNA.    Pt does state she is more lethargic than normal due to taking a xanex prior to CT due to claustrophobia.   Pt currently on 4L/min via [place] with a sat of 93%

## 2022-03-29 NOTE — ED Notes (Signed)
ED provider at bedside.

## 2022-03-29 NOTE — Therapy (Signed)
Pt was here for a CT scan to rule out PE.  Pulse oximetry showed a saturation of 70-76% on Room air.  Pt was very short of breath and nail beds were blue.  Pt refused to go to the ED.  I went to CT with patient and placed her on 2 lpm [place] prior to CT scan.  Saturations remained between 89-92% while on the CT scanner.  I called Dr. Olin Pia CMA to get a message to him regarding patient status.  Post CT scan we assisted patient to a sitting position.  Her saturation with exertion went down to 85% with 2 LPM [place].  Dr. Caryl Comes came over and encouraged the patient to go to the ED based on her severe shortness of breath and the onset of PNA that was seen on CT.  Pt was taken to ED via wheelchair with 2 lpm .  Report given to triage nurse prior to leaving ED.

## 2022-03-29 NOTE — ED Notes (Signed)
Date and time results received: 03/29/22 2:05 PM   Test: CO2- Blood gas Critical Value: 104  Name of Provider Notified: Dr. Kerman Passey

## 2022-03-29 NOTE — Plan of Care (Signed)
  Problem: Education: Goal: Ability to demonstrate management of disease process will improve Outcome: Progressing Goal: Ability to verbalize understanding of medication therapies will improve Outcome: Progressing Goal: Individualized Educational Video(s) Outcome: Progressing   Problem: Activity: Goal: Capacity to carry out activities will improve Outcome: Progressing   Problem: Cardiac: Goal: Ability to achieve and maintain adequate cardiopulmonary perfusion will improve Outcome: Progressing   Problem: Education: Goal: Ability to demonstrate management of disease process will improve Outcome: Progressing Goal: Ability to verbalize understanding of medication therapies will improve Outcome: Progressing Goal: Individualized Educational Video(s) Outcome: Progressing   Problem: Activity: Goal: Capacity to carry out activities will improve Outcome: Progressing   Problem: Cardiac: Goal: Ability to achieve and maintain adequate cardiopulmonary perfusion will improve Outcome: Progressing   Problem: Education: Goal: Knowledge of disease or condition will improve Outcome: Progressing Goal: Knowledge of the prescribed therapeutic regimen will improve Outcome: Progressing Goal: Individualized Educational Video(s) Outcome: Progressing   Problem: Activity: Goal: Ability to tolerate increased activity will improve Outcome: Progressing Goal: Will verbalize the importance of balancing activity with adequate rest periods Outcome: Progressing   Problem: Respiratory: Goal: Ability to maintain a clear airway will improve Outcome: Progressing Goal: Levels of oxygenation will improve Outcome: Progressing Goal: Ability to maintain adequate ventilation will improve Outcome: Progressing   Problem: Education: Goal: Knowledge of disease or condition will improve Outcome: Progressing Goal: Knowledge of the prescribed therapeutic regimen will improve Outcome: Progressing Goal:  Individualized Educational Video(s) Outcome: Progressing   Problem: Activity: Goal: Ability to tolerate increased activity will improve Outcome: Progressing Goal: Will verbalize the importance of balancing activity with adequate rest periods Outcome: Progressing Problem: Clinical Measurements: Goal: Ability to maintain a body temperature in the normal range will improve Outcome: Progressing   Problem: Respiratory: Goal: Ability to maintain adequate ventilation will improve Outcome: Progressing Goal: Ability to maintain a clear airway will improve Outcome: Progressing   Problem: Education: Goal: Knowledge of General Education information will improve Description: Including pain rating scale, medication(s)/side effects and non-pharmacologic comfort measures Outcome: Progressing   Problem: Health Behavior/Discharge Planning: Goal: Ability to manage health-related needs will improve Outcome: Progressing   Problem: Clinical Measurements: Goal: Ability to maintain clinical measurements within normal limits will improve Outcome: Progressing Goal: Will remain free from infection Outcome: Progressing Goal: Diagnostic test results will improve Outcome: Progressing Goal: Respiratory complications will improve Outcome: Progressing Goal: Cardiovascular complication will be avoided Outcome: Progressing   Problem: Activity: Goal: Risk for activity intolerance will decrease Outcome: Progressing   Problem: Nutrition: Goal: Adequate nutrition will be maintained Outcome: Progressing   Problem: Coping: Goal: Level of anxiety will decrease Outcome: Progressing   Problem: Elimination: Goal: Will not experience complications related to bowel motility Outcome: Progressing Goal: Will not experience complications related to urinary retention Outcome: Progressing   Problem: Pain Managment: Goal: General experience of comfort will improve Outcome: Progressing   Problem:  Safety: Goal: Ability to remain free from injury will improve Outcome: Progressing   Problem: Skin Integrity: Goal: Risk for impaired skin integrity will decrease Outcome: Progressing     Problem: Respiratory: Goal: Ability to maintain a clear airway will improve Outcome: Progressing Goal: Levels of oxygenation will improve Outcome: Progressing Goal: Ability to maintain adequate ventilation will improve Outcome: Progressing
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
## 2022-03-30 DIAGNOSIS — J9621 Acute and chronic respiratory failure with hypoxia: Secondary | ICD-10-CM | POA: Diagnosis not present

## 2022-03-30 DIAGNOSIS — J9622 Acute and chronic respiratory failure with hypercapnia: Secondary | ICD-10-CM | POA: Diagnosis not present

## 2022-03-30 DIAGNOSIS — J441 Chronic obstructive pulmonary disease with (acute) exacerbation: Secondary | ICD-10-CM | POA: Diagnosis not present

## 2022-03-30 LAB — BASIC METABOLIC PANEL
Anion gap: 6 (ref 5–15)
BUN: 23 mg/dL (ref 8–23)
CO2: 32 mmol/L (ref 22–32)
Calcium: 8.6 mg/dL — ABNORMAL LOW (ref 8.9–10.3)
Chloride: 103 mmol/L (ref 98–111)
Creatinine, Ser: 0.88 mg/dL (ref 0.44–1.00)
GFR, Estimated: >60 mL/min (ref >60)
Glucose, Bld: 156 mg/dL — ABNORMAL HIGH (ref 70–99)
Potassium: 4.2 mmol/L (ref 3.5–5.1)
Sodium: 141 mmol/L (ref 135–145)

## 2022-03-30 LAB — CBC
HCT: 43.4 % (ref 36.0–46.0)
Hemoglobin: 13.1 g/dL (ref 12.0–15.0)
MCH: 30.8 pg (ref 26.0–34.0)
MCHC: 30.2 g/dL (ref 30.0–36.0)
MCV: 102.1 fL — ABNORMAL HIGH (ref 80.0–100.0)
Platelets: 172 10*3/uL (ref 150–400)
RBC: 4.25 MIL/uL (ref 3.87–5.11)
RDW: 13.2 % (ref 11.5–15.5)
WBC: 5 10*3/uL (ref 4.0–10.5)
nRBC: 0 % (ref 0.0–0.2)

## 2022-03-30 LAB — MAGNESIUM: Magnesium: 2.4 mg/dL (ref 1.7–2.4)

## 2022-03-30 LAB — GLUCOSE, CAPILLARY: Glucose-Capillary: 96 mg/dL (ref 70–99)

## 2022-03-30 MED ORDER — ENOXAPARIN SODIUM 60 MG/0.6ML IJ SOSY
0.5000 mg/kg | PREFILLED_SYRINGE | INTRAMUSCULAR | Status: DC
  Administered 2022-03-30: 52.5 mg via SUBCUTANEOUS
  Filled 2022-03-30 (×2): qty 0.6

## 2022-03-30 NOTE — Consult Note (Signed)
PHARMACIST - PHYSICIAN COMMUNICATION  CONCERNING:  Enoxaparin (Lovenox) for DVT Prophylaxis    RECOMMENDATION: Patient was prescribed enoxaprin '40mg'$  q24 hours for VTE prophylaxis.   Filed Weights   03/29/22 1720 03/30/22 0438  Weight: 106.7 kg (235 lb 3.7 oz) 107 kg (235 lb 14.3 oz)    Body mass index is 39.25 kg/m.  Estimated Creatinine Clearance: 67.1 mL/min (by C-G formula based on SCr of 0.88 mg/dL).   Based on [place] patient is candidate for enoxaparin 0.'5mg'$ /kg TBW SQ every 24 hours based on BMI being >30.  DESCRIPTION: Pharmacy has adjusted enoxaparin dose per [facility] policy.  Patient is now receiving enoxaparin 52.5 mg every 24 hours   Gretel Acre, PharmD PGY1 Pharmacy Resident 03/30/2022 9:26 AM

## 2022-03-30 NOTE — Progress Notes (Signed)
[facility] PROGRESS NOTE  Hannah Powell TKZ:[id number] DOB: 24-Dec-1945 DOA: 03/29/2022 PCP: Adin Hector, MD  Status is: Inpatient Remains inpatient appropriate because:  hypercarbic respiratory failure  Barriers to discharge: Social:  Clinical:  Level of care: Progressive   Code Status:  Family Communication:  DVT prophylaxis:  COVID vaccination status:  Foley catheter: POA: Date inserted:  HPI:  Subjective:  Denies any shortness of breath  Objective: Vitals:   03/30/22 1300 03/30/22 1400  BP:  96/65  Pulse: 95 94  Resp: 13 (!) 26  Temp:    SpO2: 96% 95%    Intake/Output Summary (Last 24 hours) at 03/30/2022 1718 Last data filed at 03/30/2022 1400 Gross per 24 hour  Intake 1398.39 ml  Output 2100 ml  Net -701.61 ml   Filed Weights   03/29/22 1720 03/30/22 0438  Weight: 106.7 kg 107 kg    Exam:  Constitutional: NAD, calm, comfortable Eyes: PERRL, lids and conjunctivae normal ENMT: Mucous membranes are moist. Posterior pharynx clear of any exudate or lesions.Normal dentition.  Neck: normal, supple, no masses, no thyromegaly Respiratory: clear to auscultation bilaterally, no wheezing, no crackles. Normal respiratory effort. No accessory muscle use.  Cardiovascular: Regular rate and rhythm, no murmurs / rubs / gallops. No extremity edema. 2+ pedal pulses. No carotid bruits.  Abdomen: no tenderness, no masses palpated. No hepatosplenomegaly. Bowel sounds positive.  Musculoskeletal: no clubbing / cyanosis. No joint deformity upper and lower extremities. Good ROM, no contractures. Normal muscle tone.  Skin: no rashes, lesions, ulcers. No induration Neurologic: CN 2-12 grossly intact. Sensation intact, DTR normal. Strength 5/5 x all 4 extremities.  Psychiatric: Normal judgment and insight. Alert and oriented x 3. Normal mood.    Assessment/Plan:  77 year old female with history of COPD on 2 L of oxygen through nasal cannula, obstructive sleep  apnea on BiPAP at night time, hypertension, diastolic heart failure, hypothyroidism, anxiety, restless leg syndrome is brought to the emergency department with worsening of the shortness of breath, dry cough , increased right lower extremity swelling.  Patient had venous Dopplers done on 03/22/2022 negative for DVT.  Also had CT of the chest which was negative for PE, only showed patchy opacity in the right middle lobe.  Patient was found to be in severe respiratory distress, O2 sats were dropping to 70s on room air could not speak in full sentences.  Patient was placed on BiPAP.  ABG showed  pH of 7.22, pCO2 of 87.    Acute hypoxic, hypercarbic respiratory failure -secondary to pneumonia, obstructive sleep apnea, COPD exacerbation, obesity hypoventilation syndrome.   -continue with BiPAP at night - continue to treat underlying conditions and follow-up  -currently on room air.    Pneumonia community-acquired  -keep the patient on Rocephin, azithromycin.    COPD exacerbation -continue with duo nebs, Solu-Medrol.    Congestive heart failure diastolic acute on chronic -continue with IV diuresis  Hypertension - continue the Cozaar   Restless leg syndrome -continue with pramipexole.    Hypothyroidism -continue with Synthroid  Anxiety - on as needed for Xanax.    Cellulitis of the right lower extremity -continue with Rocephin.    Advance care planning -full code  Morbid obesity -patient has BMI of 39  -counseling done regarding diet and exercise    Data Reviewed: Basic Metabolic Panel: Recent Labs  Lab 03/29/22 1303 03/30/22 0509  NA 140 141  K 4.0 4.2  CL 102 103  CO2 31 32  GLUCOSE 104* 156*  BUN 18 23  CREATININE 0.70 0.88  CALCIUM 9.0 8.6*  MG  --  2.4   Liver Function Tests: No results for input(s): "AST", "ALT", "ALKPHOS", "BILITOT", "PROT", "ALBUMIN" in the last 168 hours. No results for input(s): "LIPASE", "AMYLASE" in the last 168 hours. No results for  input(s): "AMMONIA" in the last 168 hours. CBC: Recent Labs  Lab 03/29/22 1303 03/30/22 0509  WBC 8.0 5.0  HGB 14.1 13.1  HCT 47.2* 43.4  MCV 103.1* 102.1*  PLT 165 172   Cardiac Enzymes: No results for input(s): "CKTOTAL", "CKMB", "CKMBINDEX", "TROPONINI" in the last 168 hours. BNP (last 3 results) Recent Labs    02/08/22 0841 03/29/22 1303  BNP 27.3 234.4*    ProBNP (last 3 results) No results for input(s): "PROBNP" in the last 8760 hours.  CBG: Recent Labs  Lab 03/29/22 1724  GLUCAP 96    Recent Results (from the past 240 hour(s))  Resp panel by RT-PCR (RSV, Flu A&B, Covid) Anterior Nasal Swab     Status: None   Collection Time: 03/29/22  1:07 PM   Specimen: Anterior Nasal Swab  Result Value Ref Range Status   SARS Coronavirus 2 by RT PCR NEGATIVE NEGATIVE Final    Comment: (NOTE) SARS-CoV-2 target nucleic acids are NOT DETECTED.  The SARS-CoV-2 RNA is generally detectable in upper respiratory specimens during the acute phase of infection. The lowest concentration of SARS-CoV-2 viral copies this assay can detect is 138 copies/mL. A negative result does not preclude SARS-Cov-2 infection and should not be used as the sole basis for treatment or other patient management decisions. A negative result may occur with  improper specimen collection/handling, submission of specimen other than nasopharyngeal swab, presence of viral mutation(s) within the areas targeted by this assay, and inadequate number of viral copies(<138 copies/mL). A negative result must be combined with clinical observations, patient history, and epidemiological information. The expected result is Negative.  Fact Sheet for Patients:  EntrepreneurPulse.com.au  Fact Sheet for Healthcare Providers:  IncredibleEmployment.be  This test is no t yet approved or cleared by the [place] FDA and  has been authorized for detection and/or diagnosis of SARS-CoV-2  by FDA under an Emergency Use Authorization (EUA). This EUA will remain  in effect (meaning this test can be used) for the duration of the COVID-19 declaration under Section 564(b)(1) of the Act, 21 U.S.C.section 360bbb-3(b)(1), unless the authorization is terminated  or revoked sooner.       Influenza A by PCR NEGATIVE NEGATIVE Final   Influenza B by PCR NEGATIVE NEGATIVE Final    Comment: (NOTE) The Xpert Xpress SARS-CoV-2/FLU/RSV plus assay is intended as an aid in the diagnosis of influenza from Nasopharyngeal swab specimens and should not be used as a sole basis for treatment. Nasal washings and aspirates are unacceptable for Xpert Xpress SARS-CoV-2/FLU/RSV testing.  Fact Sheet for Patients: EntrepreneurPulse.com.au  Fact Sheet for Healthcare Providers: IncredibleEmployment.be  This test is not yet approved or cleared by the [place] FDA and has been authorized for detection and/or diagnosis of SARS-CoV-2 by FDA under an Emergency Use Authorization (EUA). This EUA will remain in effect (meaning this test can be used) for the duration of the COVID-19 declaration under Section 564(b)(1) of the Act, 21 U.S.C. section 360bbb-3(b)(1), unless the authorization is terminated or revoked.     Resp Syncytial Virus by PCR NEGATIVE NEGATIVE Final    Comment: (NOTE) Fact Sheet for Patients: EntrepreneurPulse.com.au  Fact Sheet for Healthcare Providers: IncredibleEmployment.be  This test  is not yet approved or cleared by the [place] and has been authorized for detection and/or diagnosis of SARS-CoV-2 by FDA under an Emergency Use Authorization (EUA). This EUA will remain in effect (meaning this test can be used) for the duration of the COVID-19 declaration under Section 564(b)(1) of the Act, 21 U.S.C. section 360bbb-3(b)(1), unless the authorization is terminated or revoked.  Performed at  [facility], [address] [place], [place] [address]   Culture, blood (routine x 2) Call MD if unable to obtain prior to antibiotics being given     Status: None (Preliminary result)   Collection Time: 03/29/22  3:19 PM   Specimen: BLOOD LEFT FOREARM  Result Value Ref Range Status   Specimen Description BLOOD LEFT FOREARM  Final   Special Requests   Final    BOTTLES DRAWN AEROBIC AND ANAEROBIC Blood Culture results may not be optimal due to an inadequate volume of blood received in culture bottles   Culture   Final    NO GROWTH < 24 HOURS Performed at [facility], [address] [place], [place] [address]    Report Status PENDING  Incomplete  Culture, blood (routine x 2) Call MD if unable to obtain prior to antibiotics being given     Status: None (Preliminary result)   Collection Time: 03/29/22  3:19 PM   Specimen: Left Antecubital; Blood  Result Value Ref Range Status   Specimen Description LEFT ANTECUBITAL  Final   Special Requests   Final    BOTTLES DRAWN AEROBIC AND ANAEROBIC Blood Culture adequate volume   Culture   Final    NO GROWTH < 24 HOURS Performed at [facility], [address] [place], [place] [address]    Report Status PENDING  Incomplete  MRSA Next Gen by PCR, Nasal     Status: None   Collection Time: 03/29/22  5:24 PM   Specimen: Nasal Mucosa; Nasal Swab  Result Value Ref Range Status   MRSA by PCR Next Gen NOT DETECTED NOT DETECTED Final    Comment: (NOTE) The GeneXpert MRSA Assay (FDA approved for NASAL specimens only), is one component of a comprehensive MRSA colonization surveillance program. It is not intended to diagnose MRSA infection nor to guide or monitor treatment for MRSA infections. Test performance is not FDA approved in patients less than 38 years old. Performed at [facility], [address] [place], [place] [address]      Studies: DG Chest 2 View  Result Date: 03/29/2022 CLINICAL DATA:   1 EXAM: CHEST - 2 VIEW COMPARISON:  Same day CTA chest FINDINGS: The heart is enlarged, unchanged. The upper mediastinal contours are stable. There is patchy opacity in the right middle lobe corresponding to findings on the same-day CTA chest. Otherwise, there is no focal airspace disease. There is no overt pulmonary edema. There is no pleural effusion or pneumothorax Bilateral shoulder arthroplasty hardware is noted. There is exaggerated thoracic kyphosis with marked compression deformity of the midthoracic vertebral body. Lower cervical spine fusion hardware is noted with mild positioning of the lower screws as described on the same-day CT chest. IMPRESSION: 1. Patchy opacity in the right middle lobe may reflect pneumonia in the correct clinical setting, corresponding to findings seen on the same day CTA chest. 2. Cardiomegaly. Electronically Signed   By: Valetta Mole M.D.   On: 03/29/2022 13:32   CT Angio Chest Pulmonary Embolism (PE) W or WO Contrast  Result Date: 03/29/2022 CLINICAL  DATA:  Chest pain and respiratory distress. EXAM: CT ANGIOGRAPHY CHEST WITH CONTRAST TECHNIQUE: Multidetector CT imaging of the chest was performed using the standard protocol during bolus administration of intravenous contrast. Multiplanar CT image reconstructions and MIPs were obtained to evaluate the vascular anatomy. RADIATION DOSE REDUCTION: This exam was performed according to the departmental dose-optimization program which includes automated exposure control, adjustment of the mA and/or kV according to patient size and/or use of iterative reconstruction technique. CONTRAST:  31m OMNIPAQUE IOHEXOL 350 MG/ML SOLN COMPARISON:  08/01/2016 FINDINGS: Cardiovascular: The heart is within normal limits in size for age. No pericardial effusion. There is tortuosity, ectasia and calcification of the thoracic aorta but no focal aneurysm dissection. The branch vessels are patent. Scattered aortic and coronary artery calcifications.  The pulmonary arterial tree is fairly well opacified. No filling defects to suggest pulmonary embolism. The pulmonary arteries are enlarged suggesting pulmonary hypertension. Mediastinum/Nodes: No mediastinal or hilar mass or lymphadenopathy. Small scattered lymph nodes are noted. The esophagus is grossly normal. Lungs/Pleura: Exam limited by breathing motion artifact. Mild emphysematous changes and hyperinflation. No pulmonary edema or pleural effusions. Patchy airspace opacity and scattered air bronchograms in the right middle lobe infiltrate versus atelectasis. 4 mm right lower lobe pulmonary nodule on image 99/5. Sub solid nodule measuring 7 mm in the left lower lobe on image number 93/5. Findings are indeterminate. Recommend follow-up noncontrast CT scan in 3-6 months to reassess. Upper Abdomen: 2 cm left adrenal gland lesion measures 10 Hounsfield units and was present on the prior study from 2018. This is considered a benign adenoma does not any further imaging evaluation or follow-up. Musculoskeletal: No medial breast masses, supraclavicular or axillary adenopathy. Bilateral total shoulder arthroplasties with significant artifact. No cervical spine fusion hardware noted. The lower vertebral body screws are loose. One is projecting approximately 15 mm anteriorly from the plate and the other one appears to be loose anteriorly in the retropharyngeal/prevertebral space. Remote upper thoracic compression fracture and associated exaggerated thoracic kyphosis. A few small scattered sclerotic bone lesions appears stable and are likely benign bone islands. Review of the MIP images confirms the above findings. IMPRESSION: 1. No CT findings for pulmonary embolism. 2. Enlarged pulmonary arteries suggesting pulmonary hypertension. 3. Patchy airspace opacity and scattered air bronchograms in the right middle lobe infiltrate versus atelectasis. 4. Indeterminate bilateral lower lobe pulmonary nodules. Recommend follow-up  noncontrast CT scan in 3-6 months to reassess. 5. Stable left adrenal gland lesion consistent with benign adenoma. 6. Bilateral total shoulder arthroplasties with significant artifact. 7. Loose screws involving the lower cervical spine fusion hardware. Aortic Atherosclerosis (ICD10-I70.0) and Emphysema (ICD10-J43.9). Electronically Signed   By: PMarijo SanesM.D.   On: 03/29/2022 12:28    Scheduled Meds:  B-complex with vitamin C  1 tablet Oral Daily   calcium carbonate  1,250 mg Oral Q breakfast   Chlorhexidine Gluconate Cloth  6 each Topical Daily   enoxaparin (LOVENOX) injection  0.5 mg/kg Subcutaneous Q24H   ferrous sulfate  325 mg Oral Q breakfast   furosemide  80 mg Oral BID   ipratropium-albuterol  3 mL Nebulization BID   levothyroxine  250 mcg Oral QAC breakfast   losartan  25 mg Oral Daily   methylPREDNISolone (SOLU-MEDROL) injection  40 mg Intravenous Q12H   modafinil  100 mg Oral Daily   multivitamin with minerals  1 tablet Oral Daily   omega-3 acid ethyl esters  1 capsule Oral Daily   oxybutynin  5 mg Oral TID  pramipexole  4 mg Oral QHS   spironolactone  25 mg Oral BID   topiramate  100 mg Oral Daily   Continuous Infusions:  cefTRIAXone (ROCEPHIN)  IV 1 g (03/30/22 1607)   doxycycline (VIBRAMYCIN) IV 125 mL/hr at 03/30/22 0700    Active Problems:   Acute on chronic respiratory failure with hypoxia and hypercapnia (HCC)   COPD exacerbation (HCC)   Chronic diastolic CHF (congestive heart failure) (HCC)   HTN (hypertension)   Restless leg syndrome   Hypothyroidism   Obesity (BMI 30-39.9)   Anxiety   Cellulitis of right lower extremity   Consultants:  Critical care  Procedures:   Antibiotics: Rocephin 1/11-   Time spent:  65 minutes    Geremy Rister MD  [facility] 7 am - 330 pm/M-F for direct patient care and secure chat Please refer to [place] for contact info 1  days

## 2022-03-30 NOTE — Progress Notes (Addendum)
0140 patient wanted BIPAP off and [place] on education on keeping BIPAP on completed not successful 0200 patient wanted PRN ambien to help her sleep education again on BIPAP patient states "I will let you know when I am ready to wear BIPAP" 0500 patient keeps asking staff for ice cream and water patient at 600 ml since midnight education that she should not eat the ice cream in front of her education unsuccessful spoke with staff regarding fluid restrictions

## 2022-03-30 NOTE — TOC Initial Note (Signed)
Transition of Care [facility]) - Initial/Assessment Note    Patient Details  Name: Hannah Powell MRN: [id number] Date of Birth: 11-01-1945  Transition of Care [facility]) CM/SW Contact:    Shelbie Hutching, RN Phone Number: 03/30/2022, 12:29 PM  Clinical Narrative:                  Transition of Care [facility]) Screening Note   Patient Details  Name: Hannah Powell Date of Birth: 1945/05/27   Transition of Care [facility]) CM/SW Contact:    Shelbie Hutching, RN Phone Number: 03/30/2022, 12:30 PM    Transition of Care Department [facility]) has reviewed patient and no TOC needs have been identified at this time. We will continue to monitor patient advancement through interdisciplinary progression rounds. If new patient transition needs arise, please place a TOC consult.          Patient Goals and CMS Choice            Expected Discharge Plan and Services                                              Prior Living Arrangements/Services                       Activities of Daily Living Home Assistive Devices/Equipment: Other (Comment) (Chair lift for front steps) ADL Screening (condition at time of admission) Patient's cognitive ability adequate to safely complete daily activities?: Yes Is the patient deaf or have difficulty hearing?: No Does the patient have difficulty seeing, even when wearing glasses/contacts?: No Does the patient have difficulty concentrating, remembering, or making decisions?: No Patient able to express need for assistance with ADLs?: Yes Does the patient have difficulty dressing or bathing?: No Independently performs ADLs?: Yes (appropriate for developmental age) Does the patient have difficulty walking or climbing stairs?: Yes Weakness of Legs: None Weakness of Arms/Hands: None  Permission Sought/Granted                  Emotional Assessment              Admission diagnosis:  COPD exacerbation ([place]) [J44.1] Acute on chronic  respiratory failure with hypoxia and hypercapnia ([place]) [J96.21, J96.22] Dyspnea, unspecified type [R06.00] Patient Active Problem List   Diagnosis Date Noted   Acute on chronic respiratory failure with hypoxia and hypercapnia ([place]) 03/29/2022   Chronic diastolic CHF (congestive heart failure) ([place]) 03/29/2022   Cellulitis of right lower extremity 03/29/2022   Restless leg syndrome 02/08/2022   Hypothyroidism 02/08/2022   Obesity (BMI 30-39.9) 02/08/2022   Insomnia 02/08/2022   Anxiety 02/08/2022   Poor appetite 02/08/2022   S/P reverse total shoulder arthroplasty, 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   Chronic diastolic heart failure ([place]) 08/09/2016   COPD (chronic obstructive pulmonary disease) ([place]) 08/09/2016   Pulmonary infiltrates    Pneumonia due to Streptococcus pneumoniae ([place])    Fever    Community acquired pneumonia of right lung    Palliative care by specialist    Goals of care, counseling/discussion    Pressure injury of skin 07/03/2016   Acute respiratory failure with hypoxia ([place]) 07/02/2016   COPD exacerbation ([place]) 07/21/2015   Acute renal insufficiency 07/21/2015   Hyperglycemia 07/21/2015   HTN (hypertension) 07/21/2015   S/P shoulder replacement 07/02/2013   PCP:  Adin Hector, MD Pharmacy:  [place], [name] - [address] [name] [address] Phone: [phone] Fax: [phone]  [facility] - [place], [place] [address] Phone: [phone] Fax: [phone]     Social Determinants of Health (SDOH) Social History: SDOH Screenings   Food Insecurity: No Food Insecurity (03/29/2022)  Housing: Low Risk  (03/29/2022)  Transportation Needs: No Transportation Needs (03/29/2022)  Utilities: Not At Risk (03/29/2022)  Depression (PHQ2-9): Low Risk  (01/10/2021)  Tobacco Use: Medium Risk (03/29/2022)   SDOH Interventions:     Readmission Risk  Interventions     No data to display

## 2022-03-30 NOTE — Progress Notes (Signed)
Report called to Neysa Hotter. Pt to transfer to rm [phone] shortly.

## 2022-03-31 MED ORDER — PREDNISONE 20 MG PO TABS: 20.0000 mg | ORAL_TABLET | Freq: Two times a day (BID) | ORAL | 0 refills | Status: AC

## 2022-03-31 MED ORDER — AMOXICILLIN-POT CLAVULANATE 875-125 MG PO TABS: 1.0000 | ORAL_TABLET | Freq: Two times a day (BID) | ORAL | 0 refills | Status: AC

## 2022-03-31 MED ORDER — PRAMIPEXOLE DIHYDROCHLORIDE 1 MG PO TABS: 2.0000 mg | ORAL_TABLET | Freq: Every day | ORAL | Status: AC

## 2022-03-31 NOTE — TOC Transition Note (Signed)
Transition of Care [facility]) - CM/SW Discharge Note   Patient Details  Name: Hannah Powell MRN: [id number] Date of Birth: January 25, 1946  Transition of Care [facility]) CM/SW Contact:  Hannah Masson, RN Phone Number:[phone] 03/31/2022, 2:07 PM   Clinical Narrative:    Spoke with pt and spouse who indicated pt has already been set up with out pt therapy through [facility] and pt has a RW with a good support system. No other needs as pt able to afford his medications and currently being discharged via private vehicle with spouse.   No other needs to address at this time.         Patient Goals and CMS Choice      Discharge Placement                         Discharge Plan and Services Additional resources added to the After Visit Summary for                                       Social Determinants of Health (SDOH) Interventions SDOH Screenings   Food Insecurity: No Food Insecurity (03/29/2022)  Housing: Low Risk  (03/29/2022)  Transportation Needs: No Transportation Needs (03/29/2022)  Utilities: Not At Risk (03/29/2022)  Depression (PHQ2-9): Low Risk  (01/10/2021)  Tobacco Use: Medium Risk (03/29/2022)     Readmission Risk Interventions     No data to display

## 2022-03-31 NOTE — Progress Notes (Signed)
Bipap pressure reduced at pts request

## 2022-03-31 NOTE — Progress Notes (Signed)
[facility] PROGRESS NOTE  Hannah Powell FTD:[id number] DOB: [birth year] DOA: 03/29/2022 PCP: Adin Hector, MD  Status is: Inpatient Remains inpatient appropriate because:  hypercarbic respiratory failure  Barriers to discharge: Social:  Clinical:  Level of care: Progressive   Code Status:  Family Communication:  DVT prophylaxis:  COVID vaccination status:  Foley catheter: POA: Date inserted:  HPI:  Subjective:  Denies any shortness of breath  Objective: Vitals:   03/31/22 0754 03/31/22 0852  BP: 136/76   Pulse: 76 87  Resp: 20 16  Temp:    SpO2: 100% 92%    Intake/Output Summary (Last 24 hours) at 03/31/2022 0927 Last data filed at 03/31/2022 0727 Gross per 24 hour  Intake 1628.09 ml  Output 2650 ml  Net -1021.91 ml   Filed Weights   03/29/22 1720 03/30/22 0438  Weight: 106.7 kg 107 kg    Exam:  Constitutional: NAD, calm, comfortable Eyes: PERRL, lids and conjunctivae normal ENMT: Mucous membranes are moist. Posterior pharynx clear of any exudate or lesions.Normal dentition.  Neck: normal, supple, no masses, no thyromegaly Respiratory: clear to auscultation bilaterally, no wheezing, no crackles. Normal respiratory effort. No accessory muscle use.  Cardiovascular: Regular rate and rhythm, no murmurs / rubs / gallops. No extremity edema. 2+ pedal pulses. No carotid bruits.  Abdomen: no tenderness, no masses palpated. No hepatosplenomegaly. Bowel sounds positive.  Musculoskeletal: no clubbing / cyanosis. No joint deformity upper and lower extremities. Good ROM, no contractures. Normal muscle tone.  Skin: no rashes, lesions, ulcers. No induration Neurologic: CN 2-12 grossly intact. Sensation intact, DTR normal. Strength 5/5 x all 4 extremities.  Psychiatric: Normal judgment and insight. Alert and oriented x 3. Normal mood.    Assessment/Plan:  77 year old female with history of COPD on 2 L of oxygen through nasal cannula, obstructive sleep  apnea on BiPAP at night time, hypertension, diastolic heart failure, hypothyroidism, anxiety, restless leg syndrome is brought to the emergency department with worsening of the shortness of breath, dry cough , increased right lower extremity swelling.  Patient had venous Dopplers done on 03/22/2022 negative for DVT.  Also had CT of the chest which was negative for PE, only showed patchy opacity in the right middle lobe.  Patient was found to be in severe respiratory distress, O2 sats were dropping to 70s on room air could not speak in full sentences.  Patient was placed on BiPAP.  ABG showed  pH of 7.22, pCO2 of 87.    Acute hypoxic, hypercarbic respiratory failure -secondary to pneumonia, obstructive sleep apnea, COPD exacerbation, obesity hypoventilation syndrome.   -continue with BiPAP at night - continue to treat underlying conditions and follow-up  -currently on room air.    Pneumonia community-acquired  -keep the patient on Rocephin, azithromycin.    COPD exacerbation -continue with duo nebs, Solu-Medrol.    Congestive heart failure diastolic acute on chronic -continue with IV diuresis  Hypertension - continue the Cozaar   Restless leg syndrome -continue with pramipexole.    Hypothyroidism -continue with Synthroid  Anxiety - on as needed for Xanax.    Cellulitis of the right lower extremity -continue with Rocephin.    Advance care planning -full code  Morbid obesity -patient has BMI of 39  -counseling done regarding diet and exercise    Data Reviewed: Basic Metabolic Panel: Recent Labs  Lab 03/29/22 1303 03/30/22 0509  NA 140 141  K 4.0 4.2  CL 102 103  CO2 31 32  GLUCOSE 104* 156*  BUN 18 23  CREATININE 0.70 0.88  CALCIUM 9.0 8.6*  MG  --  2.4   Liver Function Tests: No results for input(s): "AST", "ALT", "ALKPHOS", "BILITOT", "PROT", "ALBUMIN" in the last 168 hours. No results for input(s): "LIPASE", "AMYLASE" in the last 168 hours. No results for  input(s): "AMMONIA" in the last 168 hours. CBC: Recent Labs  Lab 03/29/22 1303 03/30/22 0509  WBC 8.0 5.0  HGB 14.1 13.1  HCT 47.2* 43.4  MCV 103.1* 102.1*  PLT 165 172   Cardiac Enzymes: No results for input(s): "CKTOTAL", "CKMB", "CKMBINDEX", "TROPONINI" in the last 168 hours. BNP (last 3 results) Recent Labs    02/08/22 0841 03/29/22 1303  BNP 27.3 234.4*    ProBNP (last 3 results) No results for input(s): "PROBNP" in the last 8760 hours.  CBG: Recent Labs  Lab 03/29/22 1724  GLUCAP 96    Recent Results (from the past 240 hour(s))  Resp panel by RT-PCR (RSV, Flu A&B, Covid) Anterior Nasal Swab     Status: None   Collection Time: 03/29/22  1:07 PM   Specimen: Anterior Nasal Swab  Result Value Ref Range Status   SARS Coronavirus 2 by RT PCR NEGATIVE NEGATIVE Final    Comment: (NOTE) SARS-CoV-2 target nucleic acids are NOT DETECTED.  The SARS-CoV-2 RNA is generally detectable in upper respiratory specimens during the acute phase of infection. The lowest concentration of SARS-CoV-2 viral copies this assay can detect is 138 copies/mL. A negative result does not preclude SARS-Cov-2 infection and should not be used as the sole basis for treatment or other patient management decisions. A negative result may occur with  improper specimen collection/handling, submission of specimen other than nasopharyngeal swab, presence of viral mutation(s) within the areas targeted by this assay, and inadequate number of viral copies(<138 copies/mL). A negative result must be combined with clinical observations, patient history, and epidemiological information. The expected result is Negative.  Fact Sheet for Patients:  EntrepreneurPulse.com.au  Fact Sheet for Healthcare Providers:  IncredibleEmployment.be  This test is no t yet approved or cleared by the [place] FDA and  has been authorized for detection and/or diagnosis of SARS-CoV-2  by FDA under an Emergency Use Authorization (EUA). This EUA will remain  in effect (meaning this test can be used) for the duration of the COVID-19 declaration under Section 564(b)(1) of the Act, 21 U.S.C.section 360bbb-3(b)(1), unless the authorization is terminated  or revoked sooner.       Influenza A by PCR NEGATIVE NEGATIVE Final   Influenza B by PCR NEGATIVE NEGATIVE Final    Comment: (NOTE) The Xpert Xpress SARS-CoV-2/FLU/RSV plus assay is intended as an aid in the diagnosis of influenza from Nasopharyngeal swab specimens and should not be used as a sole basis for treatment. Nasal washings and aspirates are unacceptable for Xpert Xpress SARS-CoV-2/FLU/RSV testing.  Fact Sheet for Patients: EntrepreneurPulse.com.au  Fact Sheet for Healthcare Providers: IncredibleEmployment.be  This test is not yet approved or cleared by the [place] FDA and has been authorized for detection and/or diagnosis of SARS-CoV-2 by FDA under an Emergency Use Authorization (EUA). This EUA will remain in effect (meaning this test can be used) for the duration of the COVID-19 declaration under Section 564(b)(1) of the Act, 21 U.S.C. section 360bbb-3(b)(1), unless the authorization is terminated or revoked.     Resp Syncytial Virus by PCR NEGATIVE NEGATIVE Final    Comment: (NOTE) Fact Sheet for Patients: EntrepreneurPulse.com.au  Fact Sheet for Healthcare Providers: IncredibleEmployment.be  This test  is not yet approved or cleared by the [place] and has been authorized for detection and/or diagnosis of SARS-CoV-2 by FDA under an Emergency Use Authorization (EUA). This EUA will remain in effect (meaning this test can be used) for the duration of the COVID-19 declaration under Section 564(b)(1) of the Act, 21 U.S.C. section 360bbb-3(b)(1), unless the authorization is terminated or revoked.  Performed at  [facility], [address] [place], [place] [address]   Culture, blood (routine x 2) Call MD if unable to obtain prior to antibiotics being given     Status: None (Preliminary result)   Collection Time: 03/29/22  3:19 PM   Specimen: BLOOD LEFT FOREARM  Result Value Ref Range Status   Specimen Description BLOOD LEFT FOREARM  Final   Special Requests   Final    BOTTLES DRAWN AEROBIC AND ANAEROBIC Blood Culture results may not be optimal due to an inadequate volume of blood received in culture bottles   Culture   Final    NO GROWTH 2 DAYS Performed at [facility], [address] [place], [place] [address]    Report Status PENDING  Incomplete  Culture, blood (routine x 2) Call MD if unable to obtain prior to antibiotics being given     Status: None (Preliminary result)   Collection Time: 03/29/22  3:19 PM   Specimen: Left Antecubital; Blood  Result Value Ref Range Status   Specimen Description LEFT ANTECUBITAL  Final   Special Requests   Final    BOTTLES DRAWN AEROBIC AND ANAEROBIC Blood Culture adequate volume   Culture   Final    NO GROWTH 2 DAYS Performed at [facility], [address] [place], [place] [address]    Report Status PENDING  Incomplete  MRSA Next Gen by PCR, Nasal     Status: None   Collection Time: 03/29/22  5:24 PM   Specimen: Nasal Mucosa; Nasal Swab  Result Value Ref Range Status   MRSA by PCR Next Gen NOT DETECTED NOT DETECTED Final    Comment: (NOTE) The GeneXpert MRSA Assay (FDA approved for NASAL specimens only), is one component of a comprehensive MRSA colonization surveillance program. It is not intended to diagnose MRSA infection nor to guide or monitor treatment for MRSA infections. Test performance is not FDA approved in patients less than 37 years old. Performed at [facility], [address] [place], [place] [address]      Studies: DG Chest 2 View  Result Date: 03/29/2022 CLINICAL DATA:  1 EXAM:  CHEST - 2 VIEW COMPARISON:  Same day CTA chest FINDINGS: The heart is enlarged, unchanged. The upper mediastinal contours are stable. There is patchy opacity in the right middle lobe corresponding to findings on the same-day CTA chest. Otherwise, there is no focal airspace disease. There is no overt pulmonary edema. There is no pleural effusion or pneumothorax Bilateral shoulder arthroplasty hardware is noted. There is exaggerated thoracic kyphosis with marked compression deformity of the midthoracic vertebral body. Lower cervical spine fusion hardware is noted with mild positioning of the lower screws as described on the same-day CT chest. IMPRESSION: 1. Patchy opacity in the right middle lobe may reflect pneumonia in the correct clinical setting, corresponding to findings seen on the same day CTA chest. 2. Cardiomegaly. Electronically Signed   By: Valetta Mole M.D.   On: 03/29/2022 13:32   CT Angio Chest Pulmonary Embolism (PE) W or WO Contrast  Result Date: 03/29/2022 CLINICAL DATA:  Chest pain and respiratory distress. EXAM: CT ANGIOGRAPHY CHEST WITH CONTRAST TECHNIQUE: Multidetector CT imaging of the chest was performed using the standard protocol during bolus administration of intravenous contrast. Multiplanar CT image reconstructions and MIPs were obtained to evaluate the vascular anatomy. RADIATION DOSE REDUCTION: This exam was performed according to the departmental dose-optimization program which includes automated exposure control, adjustment of the mA and/or kV according to patient size and/or use of iterative reconstruction technique. CONTRAST:  56m OMNIPAQUE IOHEXOL 350 MG/ML SOLN COMPARISON:  08/01/2016 FINDINGS: Cardiovascular: The heart is within normal limits in size for age. No pericardial effusion. There is tortuosity, ectasia and calcification of the thoracic aorta but no focal aneurysm dissection. The branch vessels are patent. Scattered aortic and coronary artery calcifications. The  pulmonary arterial tree is fairly well opacified. No filling defects to suggest pulmonary embolism. The pulmonary arteries are enlarged suggesting pulmonary hypertension. Mediastinum/Nodes: No mediastinal or hilar mass or lymphadenopathy. Small scattered lymph nodes are noted. The esophagus is grossly normal. Lungs/Pleura: Exam limited by breathing motion artifact. Mild emphysematous changes and hyperinflation. No pulmonary edema or pleural effusions. Patchy airspace opacity and scattered air bronchograms in the right middle lobe infiltrate versus atelectasis. 4 mm right lower lobe pulmonary nodule on image 99/5. Sub solid nodule measuring 7 mm in the left lower lobe on image number 93/5. Findings are indeterminate. Recommend follow-up noncontrast CT scan in 3-6 months to reassess. Upper Abdomen: 2 cm left adrenal gland lesion measures 10 Hounsfield units and was present on the prior study from 2018. This is considered a benign adenoma does not any further imaging evaluation or follow-up. Musculoskeletal: No medial breast masses, supraclavicular or axillary adenopathy. Bilateral total shoulder arthroplasties with significant artifact. No cervical spine fusion hardware noted. The lower vertebral body screws are loose. One is projecting approximately 15 mm anteriorly from the plate and the other one appears to be loose anteriorly in the retropharyngeal/prevertebral space. Remote upper thoracic compression fracture and associated exaggerated thoracic kyphosis. A few small scattered sclerotic bone lesions appears stable and are likely benign bone islands. Review of the MIP images confirms the above findings. IMPRESSION: 1. No CT findings for pulmonary embolism. 2. Enlarged pulmonary arteries suggesting pulmonary hypertension. 3. Patchy airspace opacity and scattered air bronchograms in the right middle lobe infiltrate versus atelectasis. 4. Indeterminate bilateral lower lobe pulmonary nodules. Recommend follow-up  noncontrast CT scan in 3-6 months to reassess. 5. Stable left adrenal gland lesion consistent with benign adenoma. 6. Bilateral total shoulder arthroplasties with significant artifact. 7. Loose screws involving the lower cervical spine fusion hardware. Aortic Atherosclerosis (ICD10-I70.0) and Emphysema (ICD10-J43.9). Electronically Signed   By: PMarijo SanesM.D.   On: 03/29/2022 12:28    Scheduled Meds:  B-complex with vitamin C  1 tablet Oral Daily   calcium carbonate  1,250 mg Oral Q breakfast   Chlorhexidine Gluconate Cloth  6 each Topical Daily   enoxaparin (LOVENOX) injection  0.5 mg/kg Subcutaneous Q24H   ferrous sulfate  325 mg Oral Q breakfast   furosemide  80 mg Oral BID   ipratropium-albuterol  3 mL Nebulization BID   levothyroxine  250 mcg Oral QAC breakfast   losartan  25 mg Oral Daily   methylPREDNISolone (SOLU-MEDROL) injection  40 mg Intravenous Q12H   modafinil  100 mg Oral Daily   multivitamin with minerals  1 tablet Oral Daily   omega-3 acid ethyl esters  1 capsule Oral Daily   oxybutynin  5 mg Oral TID   pramipexole  4 mg Oral QHS   spironolactone  25 mg Oral BID   topiramate  100 mg Oral Daily   Continuous Infusions:  cefTRIAXone (ROCEPHIN)  IV Stopped (03/30/22 1637)   doxycycline (VIBRAMYCIN) IV 100 mg (03/31/22 0723)    Active Problems:   Acute on chronic respiratory failure with hypoxia and hypercapnia (HCC)   COPD exacerbation (HCC)   Chronic diastolic CHF (congestive heart failure) (HCC)   HTN (hypertension)   Restless leg syndrome   Hypothyroidism   Obesity (BMI 30-39.9)   Anxiety   Cellulitis of right lower extremity   Consultants:  Critical care  Procedures:   Antibiotics: Rocephin 1/11-   Time spent:  85 minutes    Hannah Primm MD  [facility] 7 am - 330 pm/M-F for direct patient care and secure chat Please refer to [place] for contact info 2  days

## 2022-04-02 NOTE — Discharge Summary (Signed)
Physician Discharge Summary   Patient: Hannah Powell MRN: [id number] DOB: 09-14-1945  Admit date:     03/29/2022  Discharge date: 03/31/2022  Discharge Physician: [id number]   PCP: Adin Hector, MD   Recommendations at discharge:    F/U with PCP in 1 week  Discharge Diagnoses: Active Problems:   Acute on chronic respiratory failure with hypoxia and hypercapnia (HCC)   COPD exacerbation (HCC)   Chronic diastolic CHF (congestive heart failure) (HCC)   HTN (hypertension)   Restless leg syndrome   Hypothyroidism   Obesity (BMI 30-39.9)   Anxiety   Cellulitis of right lower extremity  Resolved Problems:   * No resolved hospital problems. [facility] Course: No notes on file  Assessment and Plan: No notes have been filed under this hospital service. Service: Hospitalist       Pain control - [employer] Controlled Substance Reporting System database was reviewed. and patient was instructed, not to drive, operate heavy machinery, perform activities at heights, swimming or participation in water activities or provide baby-sitting services while on Pain, Sleep and Anxiety Medications; until their outpatient Physician has advised to do so again. Also recommended to not to take more than prescribed Pain, Sleep and Anxiety Medications.  Consultants:  Procedures performed:  Disposition: Home Diet recommendation:  Discharge Diet Orders (From admission, onward)     Start     Ordered   03/31/22 0000  Diet - low sodium heart healthy        03/31/22 1218           Cardiac diet DISCHARGE MEDICATION: Allergies as of 03/31/2022       Reactions   Lyrica [pregabalin] Other (See Comments)   Reaction: made Restless Leg Syndrome worse         Medication List     STOP taking these medications    predniSONE 10 MG (21) Tbpk tablet Commonly known as: STERAPRED UNI-PAK 21 TAB Replaced by: predniSONE 20 MG tablet       TAKE these medications     albuterol (2.5 MG/3ML) 0.083% nebulizer solution Commonly known as: PROVENTIL Take 2.5 mg by nebulization every 6 (six) hours as needed for wheezing or shortness of breath.   Ventolin HFA 108 (90 Base) MCG/ACT inhaler Generic drug: albuterol Inhale 2 puffs into the lungs every 6 (six) hours as needed.   ALPRAZolam 0.25 MG tablet Commonly known as: XANAX Take 1 tablet (0.25 mg total) by mouth 3 (three) times daily as needed for anxiety.   amoxicillin-clavulanate 875-125 MG tablet Commonly known as: AUGMENTIN Take 1 tablet by mouth 2 (two) times daily for 7 days.   azelastine 0.1 % nasal spray Commonly known as: ASTELIN   azithromycin 250 MG tablet Commonly known as: ZITHROMAX Take 250 mg by mouth daily. Mon,weds,fri for 39 days   B-complex with vitamin C tablet Take 1 tablet by mouth daily.   Breo Ellipta 200-25 MCG/ACT Aepb Generic drug: fluticasone furoate-vilanterol Inhale 1 puff into the lungs daily.   calcium carbonate 1500 (600 Ca) MG Tabs tablet Commonly known as: OSCAL Take by mouth.   ferrous sulfate 325 (65 FE) MG tablet Take 325 mg by mouth daily with breakfast.   furosemide 80 MG tablet Commonly known as: LASIX Take 80 mg by mouth 2 (two) times daily.   ibuprofen 200 MG tablet Commonly known as: ADVIL Take 800 mg by mouth every 6 (six) hours as needed for headache or mild pain.   ibuprofen 800 MG  tablet Commonly known as: ADVIL Take by mouth.   Combivent Respimat 20-100 MCG/ACT Aers respimat Generic drug: Ipratropium-Albuterol Inhale 1 puff into the lungs 4 (four) times daily.   ipratropium-albuterol 0.5-2.5 (3) MG/3ML Soln Commonly known as: DUONEB Take 3 mLs by nebulization every 4 (four) hours as needed.   ketoconazole 2 % cream Commonly known as: NIZORAL Apply once daily under the breast and left under arm.   levalbuterol 0.31 MG/3ML nebulizer solution Commonly known as: XOPENEX Inhale into the lungs.   levothyroxine 200 MCG  tablet Commonly known as: SYNTHROID Take 250 mcg by mouth daily before breakfast.   loratadine-pseudoephedrine 10-240 MG 24 hr tablet Commonly known as: CLARITIN-D 24-hour Take 1 tablet by mouth daily as needed for allergies.   losartan 25 MG tablet Commonly known as: COZAAR Take 25 mg by mouth daily.   modafinil 100 MG tablet Commonly known as: PROVIGIL Take 100 mg by mouth daily.   MULTIVITAMIN WOMEN PO Take 1 tablet by mouth daily.   OMEGA 3 PO Take 1 capsule by mouth daily.   oxybutynin 5 MG tablet Commonly known as: DITROPAN Take 1 tablet by mouth 3 (three) times daily.   potassium chloride SA 20 MEQ tablet Commonly known as: KLOR-CON M Take 20 mEq by mouth 2 (two) times daily.   pramipexole 1 MG tablet Commonly known as: MIRAPEX Take 2 tablets (2 mg total) by mouth at bedtime. What changed: how much to take   predniSONE 20 MG tablet Commonly known as: DELTASONE Take 1 tablet (20 mg total) by mouth 2 (two) times daily with a meal for 7 days. Replaces: predniSONE 10 MG (21) Tbpk tablet   spironolactone 25 MG tablet Commonly known as: ALDACTONE Take 1 tablet by mouth 2 (two) times daily.   topiramate 100 MG tablet Commonly known as: TOPAMAX Take 100 mg by mouth daily.   traMADol 50 MG tablet Commonly known as: ULTRAM Take 1 tablet (50 mg total) by mouth every 8 (eight) hours as needed (for mild pain).   zolpidem 10 MG tablet Commonly known as: AMBIEN Take 5 mg by mouth at bedtime as needed for sleep.        Discharge Exam: Filed Weights   03/29/22 1720 03/30/22 0438  Weight: 106.7 kg 107 kg     Condition at discharge: stable  The results of significant diagnostics from this hospitalization (including imaging, microbiology, ancillary and laboratory) are listed below for reference.   Imaging Studies: DG Chest 2 View  Result Date: 03/29/2022 CLINICAL DATA:  1 EXAM: CHEST - 2 VIEW COMPARISON:  Same day CTA chest FINDINGS: The heart is enlarged,  unchanged. The upper mediastinal contours are stable. There is patchy opacity in the right middle lobe corresponding to findings on the same-day CTA chest. Otherwise, there is no focal airspace disease. There is no overt pulmonary edema. There is no pleural effusion or pneumothorax Bilateral shoulder arthroplasty hardware is noted. There is exaggerated thoracic kyphosis with marked compression deformity of the midthoracic vertebral body. Lower cervical spine fusion hardware is noted with mild positioning of the lower screws as described on the same-day CT chest. IMPRESSION: 1. Patchy opacity in the right middle lobe may reflect pneumonia in the correct clinical setting, corresponding to findings seen on the same day CTA chest. 2. Cardiomegaly. Electronically Signed   By: Valetta Mole M.D.   On: 03/29/2022 13:32   CT Angio Chest Pulmonary Embolism (PE) W or WO Contrast  Result Date: 03/29/2022 CLINICAL DATA:  Chest pain  and respiratory distress. EXAM: CT ANGIOGRAPHY CHEST WITH CONTRAST TECHNIQUE: Multidetector CT imaging of the chest was performed using the standard protocol during bolus administration of intravenous contrast. Multiplanar CT image reconstructions and MIPs were obtained to evaluate the vascular anatomy. RADIATION DOSE REDUCTION: This exam was performed according to the departmental dose-optimization program which includes automated exposure control, adjustment of the mA and/or kV according to patient size and/or use of iterative reconstruction technique. CONTRAST:  61m OMNIPAQUE IOHEXOL 350 MG/ML SOLN COMPARISON:  08/01/2016 FINDINGS: Cardiovascular: The heart is within normal limits in size for age. No pericardial effusion. There is tortuosity, ectasia and calcification of the thoracic aorta but no focal aneurysm dissection. The branch vessels are patent. Scattered aortic and coronary artery calcifications. The pulmonary arterial tree is fairly well opacified. No filling defects to suggest  pulmonary embolism. The pulmonary arteries are enlarged suggesting pulmonary hypertension. Mediastinum/Nodes: No mediastinal or hilar mass or lymphadenopathy. Small scattered lymph nodes are noted. The esophagus is grossly normal. Lungs/Pleura: Exam limited by breathing motion artifact. Mild emphysematous changes and hyperinflation. No pulmonary edema or pleural effusions. Patchy airspace opacity and scattered air bronchograms in the right middle lobe infiltrate versus atelectasis. 4 mm right lower lobe pulmonary nodule on image 99/5. Sub solid nodule measuring 7 mm in the left lower lobe on image number 93/5. Findings are indeterminate. Recommend follow-up noncontrast CT scan in 3-6 months to reassess. Upper Abdomen: 2 cm left adrenal gland lesion measures 10 Hounsfield units and was present on the prior study from 2018. This is considered a benign adenoma does not any further imaging evaluation or follow-up. Musculoskeletal: No medial breast masses, supraclavicular or axillary adenopathy. Bilateral total shoulder arthroplasties with significant artifact. No cervical spine fusion hardware noted. The lower vertebral body screws are loose. One is projecting approximately 15 mm anteriorly from the plate and the other one appears to be loose anteriorly in the retropharyngeal/prevertebral space. Remote upper thoracic compression fracture and associated exaggerated thoracic kyphosis. A few small scattered sclerotic bone lesions appears stable and are likely benign bone islands. Review of the MIP images confirms the above findings. IMPRESSION: 1. No CT findings for pulmonary embolism. 2. Enlarged pulmonary arteries suggesting pulmonary hypertension. 3. Patchy airspace opacity and scattered air bronchograms in the right middle lobe infiltrate versus atelectasis. 4. Indeterminate bilateral lower lobe pulmonary nodules. Recommend follow-up noncontrast CT scan in 3-6 months to reassess. 5. Stable left adrenal gland lesion  consistent with benign adenoma. 6. Bilateral total shoulder arthroplasties with significant artifact. 7. Loose screws involving the lower cervical spine fusion hardware. Aortic Atherosclerosis (ICD10-I70.0) and Emphysema (ICD10-J43.9). Electronically Signed   By: PMarijo SanesM.D.   On: 03/29/2022 12:28   UKoreaVenous Img Lower Unilateral Right (DVT)  Result Date: 03/22/2022 CLINICAL DATA:  RIGHT leg pain for 2 weeks EXAM: RIGHT LOWER EXTREMITY VENOUS DOPPLER ULTRASOUND TECHNIQUE: Gray-scale sonography with compression, as well as color and duplex ultrasound, were performed to evaluate the deep venous system(s) from the level of the common femoral vein through the popliteal and proximal calf veins. COMPARISON:  08/02/2017 FINDINGS: VENOUS Normal compressibility of the common femoral, superficial femoral, and popliteal veins, as well as the visualized calf veins. Visualized portions of profunda femoral vein and great saphenous vein unremarkable. No filling defects to suggest DVT on grayscale or color Doppler imaging. Doppler waveforms show normal direction of venous flow, normal respiratory plasticity and response to augmentation. Limited views of the contralateral common femoral vein are unremarkable. OTHER None. Limitations: none IMPRESSION: No  evidence of deep venous thrombosis in the RIGHT lower extremity. Electronically Signed   By: Lavonia Dana M.D.   On: 03/22/2022 10:58    Microbiology: Results for orders placed or performed during the hospital encounter of 03/29/22  Resp panel by RT-PCR (RSV, Flu A&B, Covid) Anterior Nasal Swab     Status: None   Collection Time: 03/29/22  1:07 PM   Specimen: Anterior Nasal Swab  Result Value Ref Range Status   SARS Coronavirus 2 by RT PCR NEGATIVE NEGATIVE Final    Comment: (NOTE) SARS-CoV-2 target nucleic acids are NOT DETECTED.  The SARS-CoV-2 RNA is generally detectable in upper respiratory specimens during the acute phase of infection. The  lowest concentration of SARS-CoV-2 viral copies this assay can detect is 138 copies/mL. A negative result does not preclude SARS-Cov-2 infection and should not be used as the sole basis for treatment or other patient management decisions. A negative result may occur with  improper specimen collection/handling, submission of specimen other than nasopharyngeal swab, presence of viral mutation(s) within the areas targeted by this assay, and inadequate number of viral copies(<138 copies/mL). A negative result must be combined with clinical observations, patient history, and epidemiological information. The expected result is Negative.  Fact Sheet for Patients:  EntrepreneurPulse.com.au  Fact Sheet for Healthcare Providers:  IncredibleEmployment.be  This test is no t yet approved or cleared by the [place] FDA and  has been authorized for detection and/or diagnosis of SARS-CoV-2 by FDA under an Emergency Use Authorization (EUA). This EUA will remain  in effect (meaning this test can be used) for the duration of the COVID-19 declaration under Section 564(b)(1) of the Act, 21 U.S.C.section 360bbb-3(b)(1), unless the authorization is terminated  or revoked sooner.       Influenza A by PCR NEGATIVE NEGATIVE Final   Influenza B by PCR NEGATIVE NEGATIVE Final    Comment: (NOTE) The Xpert Xpress SARS-CoV-2/FLU/RSV plus assay is intended as an aid in the diagnosis of influenza from Nasopharyngeal swab specimens and should not be used as a sole basis for treatment. Nasal washings and aspirates are unacceptable for Xpert Xpress SARS-CoV-2/FLU/RSV testing.  Fact Sheet for Patients: EntrepreneurPulse.com.au  Fact Sheet for Healthcare Providers: IncredibleEmployment.be  This test is not yet approved or cleared by the [place] FDA and has been authorized for detection and/or diagnosis of SARS-CoV-2 by FDA under  an Emergency Use Authorization (EUA). This EUA will remain in effect (meaning this test can be used) for the duration of the COVID-19 declaration under Section 564(b)(1) of the Act, 21 U.S.C. section 360bbb-3(b)(1), unless the authorization is terminated or revoked.     Resp Syncytial Virus by PCR NEGATIVE NEGATIVE Final    Comment: (NOTE) Fact Sheet for Patients: EntrepreneurPulse.com.au  Fact Sheet for Healthcare Providers: IncredibleEmployment.be  This test is not yet approved or cleared by the [place] FDA and has been authorized for detection and/or diagnosis of SARS-CoV-2 by FDA under an Emergency Use Authorization (EUA). This EUA will remain in effect (meaning this test can be used) for the duration of the COVID-19 declaration under Section 564(b)(1) of the Act, 21 U.S.C. section 360bbb-3(b)(1), unless the authorization is terminated or revoked.  Performed at [facility], [address] [place], [place] [address]   Culture, blood (routine x 2) Call MD if unable to obtain prior to antibiotics being given     Status: None (Preliminary result)   Collection Time: 03/29/22  3:19 PM   Specimen: BLOOD LEFT FOREARM  Result Value Ref Range Status   Specimen Description BLOOD LEFT FOREARM  Final   Special Requests   Final    BOTTLES DRAWN AEROBIC AND ANAEROBIC Blood Culture results may not be optimal due to an inadequate volume of blood received in culture bottles   Culture   Final    NO GROWTH 4 DAYS Performed at [facility], [address], [place], [place] [address]    Report Status PENDING  Incomplete  Culture, blood (routine x 2) Call MD if unable to obtain prior to antibiotics being given     Status: None (Preliminary result)   Collection Time: 03/29/22  3:19 PM   Specimen: Left Antecubital; Blood  Result Value Ref Range Status   Specimen Description LEFT ANTECUBITAL  Final   Special Requests   Final     BOTTLES DRAWN AEROBIC AND ANAEROBIC Blood Culture adequate volume   Culture   Final    NO GROWTH 4 DAYS Performed at [facility], [address] [place], [place] [address]    Report Status PENDING  Incomplete  MRSA Next Gen by PCR, Nasal     Status: None   Collection Time: 03/29/22  5:24 PM   Specimen: Nasal Mucosa; Nasal Swab  Result Value Ref Range Status   MRSA by PCR Next Gen NOT DETECTED NOT DETECTED Final    Comment: (NOTE) The GeneXpert MRSA Assay (FDA approved for NASAL specimens only), is one component of a comprehensive MRSA colonization surveillance program. It is not intended to diagnose MRSA infection nor to guide or monitor treatment for MRSA infections. Test performance is not FDA approved in patients less than 65 years old. Performed at [facility], [address] [place], [place] [address]     Labs: CBC: Recent Labs  Lab 03/29/22 1303 03/30/22 0509  WBC 8.0 5.0  HGB 14.1 13.1  HCT 47.2* 43.4  MCV 103.1* 102.1*  PLT 165 341   Basic Metabolic Panel: Recent Labs  Lab 03/29/22 1303 03/30/22 0509  NA 140 141  K 4.0 4.2  CL 102 103  CO2 31 32  GLUCOSE 104* 156*  BUN 18 23  CREATININE 0.70 0.88  CALCIUM 9.0 8.6*  MG  --  2.4   Liver Function Tests: No results for input(s): "AST", "ALT", "ALKPHOS", "BILITOT", "PROT", "ALBUMIN" in the last 168 hours. CBG: Recent Labs  Lab 03/29/22 1724  GLUCAP 96    Discharge time spent: greater than 30 minutes.  SignedMonica Becton, MD [facility] 04/02/2022

## 2022-04-03 DIAGNOSIS — N1831 Chronic kidney disease, stage 3a: Secondary | ICD-10-CM | POA: Diagnosis not present

## 2022-04-03 DIAGNOSIS — E7800 Pure hypercholesterolemia, unspecified: Secondary | ICD-10-CM | POA: Diagnosis not present

## 2022-04-03 DIAGNOSIS — Z79899 Other long term (current) drug therapy: Secondary | ICD-10-CM | POA: Diagnosis not present

## 2022-04-03 DIAGNOSIS — I10 Essential (primary) hypertension: Secondary | ICD-10-CM | POA: Diagnosis not present

## 2022-04-03 LAB — CULTURE, BLOOD (ROUTINE X 2)
Culture: NO GROWTH
Culture: NO GROWTH
Special Requests: ADEQUATE

## 2022-04-04 DIAGNOSIS — J9611 Chronic respiratory failure with hypoxia: Secondary | ICD-10-CM | POA: Diagnosis not present

## 2022-04-04 DIAGNOSIS — I5032 Chronic diastolic (congestive) heart failure: Secondary | ICD-10-CM | POA: Diagnosis not present

## 2022-04-04 DIAGNOSIS — J441 Chronic obstructive pulmonary disease with (acute) exacerbation: Secondary | ICD-10-CM | POA: Diagnosis not present

## 2022-04-04 DIAGNOSIS — N183 Chronic kidney disease, stage 3 unspecified: Secondary | ICD-10-CM | POA: Diagnosis not present

## 2022-04-04 DIAGNOSIS — G63 Polyneuropathy in diseases classified elsewhere: Secondary | ICD-10-CM | POA: Diagnosis not present

## 2022-04-05 ENCOUNTER — Encounter: Payer: Self-pay | Admitting: Physical Therapy

## 2022-04-05 ENCOUNTER — Ambulatory Visit: Payer: Medicare HMO | Attending: Internal Medicine | Admitting: Physical Therapy

## 2022-04-05 DIAGNOSIS — R2689 Other abnormalities of gait and mobility: Secondary | ICD-10-CM | POA: Diagnosis not present

## 2022-04-05 DIAGNOSIS — R293 Abnormal posture: Secondary | ICD-10-CM | POA: Insufficient documentation

## 2022-04-05 DIAGNOSIS — M5459 Other low back pain: Secondary | ICD-10-CM | POA: Insufficient documentation

## 2022-04-05 DIAGNOSIS — R269 Unspecified abnormalities of gait and mobility: Secondary | ICD-10-CM | POA: Diagnosis not present

## 2022-04-05 DIAGNOSIS — M6281 Muscle weakness (generalized): Secondary | ICD-10-CM | POA: Insufficient documentation

## 2022-04-05 NOTE — Therapy (Signed)
OUTPATIENT PHYSICAL THERAPY THORACOLUMBAR TREATMENT  Patient Name: Hannah Powell MRN: [id number] DOB:Apr 22, 1945, 77 y.o., female Today's Date: 04/05/2022    PT End of Session - 04/05/22 1252     Visit Number 15    Number of Visits 19    Date for PT Re-Evaluation 04/20/22    PT Start Time 1315    Activity Tolerance Patient tolerated treatment well    Behavior During Therapy [facility] for tasks assessed/performed            1315 to 1402  (47 minutes).     Past Medical History:  Diagnosis Date   Anxiety    Arthritis    CHF (congestive heart failure) (HCC)    COPD (chronic obstructive pulmonary disease) (HCC)    Dysplastic nevus 04/22/2019   R abdomen parallel to sup umbilicus - mod   Dysplastic nevus 04/22/2019   R low back above waistline - mild    Edema    feet/legs   Hypertension    dr Otho Najjar      Hypothyroidism    Leg weakness, bilateral    Osteoporosis    Oxygen deficit    2l with bipap at night   RLS (restless legs syndrome)    Shortness of breath    Sleep apnea    bipap   Wheezing    Past Surgical History:  Procedure Laterality Date   BACK SURGERY     cervical   BREAST BIOPSY Left    needle bx-neg   CATARACT EXTRACTION W/PHACO Left 05/07/2017   Procedure: CATARACT EXTRACTION PHACO AND INTRAOCULAR LENS PLACEMENT ([place]);  Surgeon: Birder Robson, MD;  Location: [facility];  Service: Ophthalmology;  Laterality: Left;  [place] 00:48.7 AP% 11.3 CDE 5.46 Fluid Pack Lot # [id number]   CATARACT EXTRACTION W/PHACO Right 04/23/2017   Procedure: CATARACT EXTRACTION PHACO AND INTRAOCULAR LENS PLACEMENT ([facility]);  Surgeon: Birder Robson, MD;  Location: [facility];  Service: Ophthalmology;  Laterality: Right;  [place] 00:29 AP% 12.8 CDE 3.79 FLUID PACK LOT # [id number]   FOOT ARTHROPLASTY     JOINT REPLACEMENT     x2 tkr   TOTAL SHOULDER ARTHROPLASTY Left 07/02/2013   Procedure: LEFT TOTAL SHOULDER ARTHROPLASTY;  Surgeon: Marin Shutter, MD;  Location: [place];  Service:  Orthopedics;  Laterality: Left;   TOTAL SHOULDER ARTHROPLASTY Right 04/10/2018   Procedure: TOTAL REVERSE SHOULDER ARTHROPLASTY;  Surgeon: Justice Britain, MD;  Location: [facility];  Service: Orthopedics;  Laterality: Right;  12mn   Patient Active Problem List   Diagnosis Date Noted   Acute on chronic respiratory failure with hypoxia and hypercapnia (HCC) 03/29/2022   Chronic diastolic CHF (congestive heart failure) ([place] 03/29/2022   Cellulitis of right lower extremity 03/29/2022   Restless leg syndrome 02/08/2022   Hypothyroidism 02/08/2022   Obesity (BMI 30-39.9) 02/08/2022   Insomnia 02/08/2022   Anxiety 02/08/2022   Poor appetite 02/08/2022   S/P reverse total shoulder arthroplasty, 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   [place] heart [place] ([place] 08/09/2016   COPD (chronic obstructive pulmonary disease) ([place] 08/09/2016   Pulmonary infiltrates    Pneumonia due to Streptococcus pneumoniae (HCC)    Fever    Community acquired pneumonia of right lung    Palliative care by specialist    Goals of care, counseling/discussion    Pressure injury of skin 07/03/2016   Acute respiratory failure with hypoxia ([facility] 07/02/2016   COPD exacerbation ([place] 07/21/2015   Acute renal insufficiency 07/21/2015   Hyperglycemia 07/21/2015   HTN (hypertension) 07/21/2015  S/P shoulder replacement 07/02/2013    PCP: Adin Hector, MD  REFERRING PROVIDER: Adin Hector, MD  REFERRING DIAG: Low back pain  Rationale for Evaluation and Treatment Rehabilitation  THERAPY DIAG:  Muscle weakness (generalized)  Gait difficulty  Other low back pain  Abnormal posture  Imbalance  ONSET DATE: 03/20/2019  SUBJECTIVE:                                                                                                                                                                                           SUBJECTIVE STATEMENT:  EVALUATION Pt. Reports chronic h/o lumbar stenosis/ back pain.  Pt. Reports  8/10 low back pain in morning and uses heating pad/ ibuprofen to decrease back pain to 1/10 after about 2 hours.  Pt. Has returned to [employer] and aquatic ex. Recently.    PERTINENT HISTORY:  See MD notes.    PAIN:  Are you having pain? Yes: NPRS scale: 1-2/10 Pain location: low back Pain description: aching/ stiff Aggravating factors: prolonged activity Relieving factors: heat/ ibuprofen   PRECAUTIONS: Fall  WEIGHT BEARING RESTRICTIONS No  FALLS:  Has patient fallen in last 6 months? No  LIVING ENVIRONMENT: Lives with: lives with their spouse Lives in: House/apartment  OCCUPATION: Retired  PLOF: Independent with household mobility with device  PATIENT GOALS Increase ability to stand from chair/ improve upright posture/ decrease low back pain.     OBJECTIVE:   PATIENT SURVEYS:  FOTO initial 57/ goal 6  SCREENING FOR RED FLAGS: Bowel or bladder incontinence: No Spinal tumors: No Cauda equina syndrome: No Compression fracture: No Abdominal aneurysm: No  COGNITION:  Overall cognitive status: Within functional limits for tasks assessed   SENSATION: Not tested  POSTURE: rounded shoulders, forward head, and increased thoracic kyphosis  PALPATION: Tenderness over lumbar paraspinals.   LUMBAR ROM:   Active  A/PROM  eval  Flexion 50% limited  Extension 75%  Right lateral flexion 50%  Left lateral flexion 50%  Right rotation 50%  Left rotation 50%   (Blank rows = not tested)  LOWER EXTREMITY ROM:     B hip flexion/ knee extension limited.    LOWER EXTREMITY MMT:    B LE muscle strength grossly 4/5 MMT except hamstring 4+/5 MMT  5xSTS: 58sec. From 23" blue mat table (challenged).    Ascends steps with L LE and heavy UE assist.   GAIT: Distance walked: In clinic Assistive device utilized: Walker - 2 wheeled  Elevated forearm support Level of assistance: Modified independence Comments: Significant forward head/ flexed posture/ thoracic  kyphosis   TODAY'S TREATMENT    04/05/22:  Subjective:  Pt. Entered PT with RW. Pt. States she has 0/10 pain and has recovered from recent hospital visit.  PT reviewed recent hospital stay and MD order.  Pt. Cleared by Dr. Caryl Comes to return to PT.     There.ex:   Nautilus (with bar): seated lat. Pull downs 50# 20 x 2. Standing tricep extension 40# 1 x 15(fatigue due to standing).  Chair behind pt. For safety.  Sitting w/ No ankle wt.s LAQ/ marching/heel-toe pumps. 20 x 1 Each.   Sitting shoulder flexion/press up against gravity 20 x 1. Pt. Cued for deep breathing during exercise.    Forward/ backwards(4 laps)/ lateral walking(3 laps).  Heavy UE assist required. in //-bars (O2 decrease to 88% but returned 93% within 60 sec.).    NuStep L3 B UE/LE for 10 min.  Vital monitored t/o.    Walking in clinic/ gym and out to car with RW and cuing to increase upright posture/ head position.  No LOB/ cuing to correct head/ upright posture.    PATIENT EDUCATION:  Education details: HEP/ upright posture/ Sit stands Person educated: Patient Education method: Customer service manager Education comprehension: verbalized understanding and returned demonstration   HOME EXERCISE PROGRAM: Exercises at [employer] use of recumbent bike.    ASSESSMENT:  CLINICAL IMPRESSION: Pt. Continues to require heavy UE assist at stairs and with RW for all mobility tasks.  Pt focused on maintaining pt. BOS and decreasing use of UE with standing/ wt. Shifting. No LOB during tx and no c/o low back symptoms. Moderate fatigue during tx. And pts. O2 sat. Maintaining >95% during tx.  Pt. Will benefit from skilled PT services in combination with gym based ex. To improve LE strengthening/ pain-free mobility.     OBJECTIVE IMPAIRMENTS Abnormal gait, cardiopulmonary status limiting activity, decreased activity tolerance, decreased balance, decreased coordination, decreased endurance, decreased mobility,  difficulty walking, decreased ROM, decreased strength, impaired flexibility, improper body mechanics, postural dysfunction, and pain.   ACTIVITY LIMITATIONS carrying, lifting, bending, standing, squatting, transfers, bed mobility, bathing, toileting, dressing, and locomotion level  PARTICIPATION LIMITATIONS: meal prep, cleaning, laundry, driving, shopping, and community activity  PERSONAL FACTORS Age, Fitness, Past/current experiences, and 3+ comorbidities:      REHAB POTENTIAL: Good  CLINICAL DECISION MAKING: Evolving/moderate complexity  EVALUATION COMPLEXITY: Moderate   GOALS: Goals reviewed with patient? Yes  SHORT TERM GOALS: Target date: 02/23/22  Pt. Will be independent with HEP to increase LE muscle strength 1/2 muscle grade to improve transfers/ safety with gait.  Baseline:  see above.  11/10: B hip strength grossly 4/5 MMT and quad/hamstring 4+/5 MMT Goal status: Partially met   LONG TERM GOALS: Target date: 04/20/22  Pt. Will increase FOTO to 63 to improve pain-free mobility.   Baseline: initial FOTO 57.  10/16: 54 (no improvement)- limited with mobility.  Goal status: Not met  2.  Pt. Able to stand from 21# chair height with no UE assist to improve transfers/ independence with daily activity.   Baseline:  Goal status: Not met  3.  Pt. Will decrease 5xSTS to <30 sec. To improve functional mobility/ decrease fall risk.  Baseline: 58sec. From 23" blue mat table (challenged).  10/16: gray chair with UE assist: 19.7 seconds Goal status: Partially met   PLAN: PT FREQUENCY: 1-2x/week  PT DURATION: 12 weeks  PLANNED INTERVENTIONS: Therapeutic exercises, Therapeutic activity, Neuromuscular re-education, Balance training, Gait training, Patient/Family education, Self Care, Joint mobilization, Stair training, Aquatic Therapy, Moist heat, Manual therapy, and Re-evaluation.  PLAN FOR  NEXT SESSION: Reassess vitals/ increase walking endurance.  Pura Spice, PT, DPT #  [id number] 04/05/2022, 1:16 PM

## 2022-04-12 ENCOUNTER — Ambulatory Visit: Payer: Medicare HMO | Admitting: Physical Therapy

## 2022-04-12 DIAGNOSIS — R269 Unspecified abnormalities of gait and mobility: Secondary | ICD-10-CM | POA: Diagnosis not present

## 2022-04-12 DIAGNOSIS — R2689 Other abnormalities of gait and mobility: Secondary | ICD-10-CM

## 2022-04-12 DIAGNOSIS — R293 Abnormal posture: Secondary | ICD-10-CM | POA: Diagnosis not present

## 2022-04-12 DIAGNOSIS — M5459 Other low back pain: Secondary | ICD-10-CM | POA: Diagnosis not present

## 2022-04-12 DIAGNOSIS — M6281 Muscle weakness (generalized): Secondary | ICD-10-CM | POA: Diagnosis not present

## 2022-04-12 NOTE — Therapy (Signed)
OUTPATIENT PHYSICAL THERAPY THORACOLUMBAR TREATMENT/ DISCHARGE  Patient Name: Hannah Powell MRN: [id number] DOB:Mar 29, 1945, 77 y.o., female Today's Date: 04/12/2022    PT End of Session - 04/12/22 1115     Visit Number 16    Number of Visits 19    Date for PT Re-Evaluation 04/20/22    PT Start Time 1112    Activity Tolerance Patient tolerated treatment well    Behavior During Therapy [facility] for tasks assessed/performed            1112 to 1200  (48 minutes)  Past Medical History:  Diagnosis Date   Anxiety    Arthritis    CHF (congestive heart failure) (HCC)    COPD (chronic obstructive pulmonary disease) ([place])    Dysplastic nevus 04/22/2019   R abdomen parallel to sup umbilicus - mod   Dysplastic nevus 04/22/2019   R low back above waistline - mild    Edema    feet/legs   Hypertension    dr Otho Najjar      Hypothyroidism    Leg weakness, bilateral    Osteoporosis    Oxygen deficit    2l with bipap at night   RLS (restless legs syndrome)    Shortness of breath    Sleep apnea    bipap   Wheezing    Past Surgical History:  Procedure Laterality Date   BACK SURGERY     cervical   BREAST BIOPSY Left    needle bx-neg   CATARACT EXTRACTION W/PHACO Left 05/07/2017   Procedure: CATARACT EXTRACTION PHACO AND INTRAOCULAR LENS PLACEMENT ([place]);  Surgeon: Birder Robson, MD;  Location: [facility];  Service: Ophthalmology;  Laterality: Left;  [place] 00:48.7 AP% 11.3 CDE 5.46 Fluid Pack Lot # [id number] H   CATARACT EXTRACTION W/PHACO Right 04/23/2017   Procedure: CATARACT EXTRACTION PHACO AND INTRAOCULAR LENS PLACEMENT (I[facility]);  Surgeon: Birder Robson, MD;  Location: [facility];  Service: Ophthalmology;  Laterality: Right;  [place] 00:29 AP% 12.8 CDE 3.79 FLUID PACK LOT # [id number]   FOOT ARTHROPLASTY     JOINT REPLACEMENT     x2 tkr   TOTAL SHOULDER ARTHROPLASTY Left 07/02/2013   Procedure: LEFT TOTAL SHOULDER ARTHROPLASTY;  Surgeon: Marin Shutter, MD;  Location: [place];  Service:  Orthopedics;  Laterality: Left;   TOTAL SHOULDER ARTHROPLASTY Right 04/10/2018   Procedure: TOTAL REVERSE SHOULDER ARTHROPLASTY;  Surgeon: Justice Britain, MD;  [facility];  Service: Orthopedics;  Laterality: Right;  137mn   Patient Active Problem List   Diagnosis Date Noted   Acute on chronic respiratory failure with hypoxia and hypercapnia (HCC) 03/29/2022   Chronic diastolic CHF (congestive heart failure) ([place] 03/29/2022   Cellulitis of right lower extremity 03/29/2022   Restless leg syndrome 02/08/2022   Hypothyroidism 02/08/2022   Obesity (BMI 30-39.9) 02/08/2022   Insomnia 02/08/2022   Anxiety 02/08/2022   Poor appetite 02/08/2022   S/P reverse total shoulder arthroplasty, 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   Chronic diastolic heart failure ([facility] [place] 08/09/2016   COPD (chronic obstructive pulmonary disease) ([facility] 08/09/2016   Pulmonary infiltrates    Pneumonia due to Streptococcus pneumoniae (HCC)    Fever    Community acquired pneumonia of right lung    Palliative care by specialist    Goals of care, counseling/discussion    Pressure injury of skin 07/03/2016   Acute respiratory failure with hypoxia ([facility] 07/02/2016   COPD exacerbation ([place] 07/21/2015   Acute renal insufficiency 07/21/2015   Hyperglycemia 07/21/2015   HTN (hypertension) 07/21/2015  S/P shoulder replacement 07/02/2013    PCP: Adin Hector, MD  REFERRING PROVIDER: Adin Hector, MD  REFERRING DIAG: Low back pain  Rationale for Evaluation and Treatment Rehabilitation  THERAPY DIAG:  Muscle weakness (generalized)  Gait difficulty  Other low back pain  Abnormal posture  Imbalance  ONSET DATE: 03/20/2019  SUBJECTIVE:                                                                                                                                                                                           SUBJECTIVE STATEMENT:  EVALUATION Pt. Reports chronic h/o lumbar stenosis/ back pain.  Pt. Reports  8/10 low back pain in morning and uses heating pad/ ibuprofen to decrease back pain to 1/10 after about 2 hours.  Pt. Has returned to [employer] and aquatic ex. Recently.    PERTINENT HISTORY:  See MD notes.    PAIN:  Are you having pain? Yes: NPRS scale: 1-2/10 Pain location: low back Pain description: aching/ stiff Aggravating factors: prolonged activity Relieving factors: heat/ ibuprofen   PRECAUTIONS: Fall  WEIGHT BEARING RESTRICTIONS No  FALLS:  Has patient fallen in last 6 months? No  LIVING ENVIRONMENT: Lives with: lives with their spouse Lives in: House/apartment  OCCUPATION: Retired  PLOF: Independent with household mobility with device  PATIENT GOALS Increase ability to stand from chair/ improve upright posture/ decrease low back pain.     OBJECTIVE:   PATIENT SURVEYS:  FOTO initial 57/ goal 73  SCREENING FOR RED FLAGS: Bowel or bladder incontinence: No Spinal tumors: No Cauda equina syndrome: No Compression fracture: No Abdominal aneurysm: No  COGNITION:  Overall cognitive status: Within functional limits for tasks assessed   SENSATION: Not tested  POSTURE: rounded shoulders, forward head, and increased thoracic kyphosis  PALPATION: Tenderness over lumbar paraspinals.   LUMBAR ROM:   Active  A/PROM  eval  Flexion 50% limited  Extension 75%  Right lateral flexion 50%  Left lateral flexion 50%  Right rotation 50%  Left rotation 50%   (Blank rows = not tested)  LOWER EXTREMITY ROM:     B hip flexion/ knee extension limited.    LOWER EXTREMITY MMT:    B LE muscle strength grossly 4/5 MMT except hamstring 4+/5 MMT  5xSTS: 58sec. From 23" blue mat table (challenged).    Ascends steps with L LE and heavy UE assist.   GAIT: Distance walked: In clinic Assistive device utilized: Walker - 2 wheeled  Elevated forearm support Level of assistance: Modified independence Comments: Significant forward head/ flexed posture/ thoracic  kyphosis   TODAY'S TREATMENT    04/11/22:  Subjective:  Pt. Entered PT with RW. Pt. States she has 0/10 low back pain.  Pt. Has been to gym and will continue with gym based ex. 2-3x/week.   O2 sat.: 94% before tx.       There.ex:   Nautilus (with bar): seated lat. Pull downs 50# 20 x 2. Standing tricep extension 30# 20x2.   Chair behind pt. For safety.  Sitting 2# bicep curls/ punches 20x.    NuStep L4 B UE/LE for 10 min.  Vital monitored t/o.  90% sat.    Forward/ backwards(4 laps)/ lateral walking(3 laps).  Heavy UE assist required. in //-bars (O2 sat. 92%).  Walking in clinic/ gym and out to car with RW and cuing to increase upright posture/ head position.  No LOB/ cuing to correct head/ upright posture.   Reviewed goals/ HEP   PATIENT EDUCATION:  Education details: HEP/ upright posture/ Sit stands Person educated: Patient Education method: Customer service manager Education comprehension: verbalized understanding and returned demonstration   HOME EXERCISE PROGRAM: Exercises at [employer] use of recumbent bike.    ASSESSMENT:  CLINICAL IMPRESSION: Pt. Continues to require heavy UE assist at stairs and with RW for all mobility tasks.   Moderate fatigue during tx. And pts. O2 sat. Maintaining >92% during tx.  Pt. Has done well since recent hospitalization and will benefit from consistent gym based therex.  Discharge from PT at this time and pt. Instructed to contact PT if any questions or concerns.     OBJECTIVE IMPAIRMENTS Abnormal gait, cardiopulmonary status limiting activity, decreased activity tolerance, decreased balance, decreased coordination, decreased endurance, decreased mobility, difficulty walking, decreased ROM, decreased strength, impaired flexibility, improper body mechanics, postural dysfunction, and pain.   ACTIVITY LIMITATIONS carrying, lifting, bending, standing, squatting, transfers, bed mobility, bathing, toileting, dressing, and  locomotion level  PARTICIPATION LIMITATIONS: meal prep, cleaning, laundry, driving, shopping, and community activity  PERSONAL FACTORS Age, Fitness, Past/current experiences, and 3+ comorbidities:      REHAB POTENTIAL: Good  CLINICAL DECISION MAKING: Evolving/moderate complexity  EVALUATION COMPLEXITY: Moderate   GOALS: Goals reviewed with patient? Yes  SHORT TERM GOALS: Target date: 02/23/22  Pt. Will be independent with HEP to increase LE muscle strength 1/2 muscle grade to improve transfers/ safety with gait.  Baseline:  see above.  11/10: B hip strength grossly 4/5 MMT and quad/hamstring 4+/5 MMT Goal status: Partially met   LONG TERM GOALS: Target date: 04/20/22  Pt. Will increase FOTO to 63 to improve pain-free mobility.   Baseline: initial FOTO 57.  10/16: 54 (no improvement)- limited with mobility.  1/25:  Goal status: Not met  2.  Pt. Able to stand from 21# chair height with no UE assist to improve transfers/ independence with daily activity.   Baseline:  Marked improvement in sitting control but unable to complete 21" chair.   Goal status: Partially met  3.  Pt. Will decrease 5xSTS to <30 sec. To improve functional mobility/ decrease fall risk.  Baseline: 58sec. From 23" blue mat table (challenged).  10/16: gray chair with UE assist: 19.7 seconds Goal status: Partially met   PLAN: PT FREQUENCY: 1-2x/week  PT DURATION: 12 weeks  PLANNED INTERVENTIONS: Therapeutic exercises, Therapeutic activity, Neuromuscular re-education, Balance training, Gait training, Patient/Family education, Self Care, Joint mobilization, Stair training, Aquatic Therapy, Moist heat, Manual therapy, and Re-evaluation.  PLAN FOR NEXT SESSION: Discharge to gym based ex.  Pura Spice, PT, DPT # [phone] 04/12/2022, 11:16 AM

## 2022-04-23 DIAGNOSIS — J449 Chronic obstructive pulmonary disease, unspecified: Secondary | ICD-10-CM | POA: Diagnosis not present

## 2022-04-27 DIAGNOSIS — I5032 Chronic diastolic (congestive) heart failure: Secondary | ICD-10-CM | POA: Diagnosis not present

## 2022-04-27 DIAGNOSIS — J441 Chronic obstructive pulmonary disease with (acute) exacerbation: Secondary | ICD-10-CM | POA: Diagnosis not present

## 2022-04-27 DIAGNOSIS — N183 Chronic kidney disease, stage 3 unspecified: Secondary | ICD-10-CM | POA: Diagnosis not present

## 2022-04-27 DIAGNOSIS — Z87890 Personal history of sex reassignment: Secondary | ICD-10-CM | POA: Diagnosis not present

## 2022-04-27 DIAGNOSIS — R051 Acute cough: Secondary | ICD-10-CM | POA: Diagnosis not present

## 2022-04-27 DIAGNOSIS — R0602 Shortness of breath: Secondary | ICD-10-CM | POA: Diagnosis not present

## 2022-04-27 DIAGNOSIS — G63 Polyneuropathy in diseases classified elsewhere: Secondary | ICD-10-CM | POA: Diagnosis not present

## 2022-04-27 DIAGNOSIS — I130 Hypertensive heart and chronic kidney disease with heart failure and stage 1 through stage 4 chronic kidney disease, or unspecified chronic kidney disease: Secondary | ICD-10-CM | POA: Diagnosis not present

## 2022-04-27 DIAGNOSIS — Z03818 Encounter for observation for suspected exposure to other biological agents ruled out: Secondary | ICD-10-CM | POA: Diagnosis not present

## 2022-04-27 DIAGNOSIS — J9611 Chronic respiratory failure with hypoxia: Secondary | ICD-10-CM | POA: Diagnosis not present

## 2022-05-07 DIAGNOSIS — I5032 Chronic diastolic (congestive) heart failure: Secondary | ICD-10-CM | POA: Diagnosis not present

## 2022-05-07 DIAGNOSIS — E039 Hypothyroidism, unspecified: Secondary | ICD-10-CM | POA: Diagnosis not present

## 2022-05-07 DIAGNOSIS — J441 Chronic obstructive pulmonary disease with (acute) exacerbation: Secondary | ICD-10-CM | POA: Diagnosis not present

## 2022-05-07 DIAGNOSIS — M5136 Other intervertebral disc degeneration, lumbar region: Secondary | ICD-10-CM | POA: Diagnosis not present

## 2022-05-07 DIAGNOSIS — I130 Hypertensive heart and chronic kidney disease with heart failure and stage 1 through stage 4 chronic kidney disease, or unspecified chronic kidney disease: Secondary | ICD-10-CM | POA: Diagnosis not present

## 2022-05-07 DIAGNOSIS — G63 Polyneuropathy in diseases classified elsewhere: Secondary | ICD-10-CM | POA: Diagnosis not present

## 2022-05-07 DIAGNOSIS — M48061 Spinal stenosis, lumbar region without neurogenic claudication: Secondary | ICD-10-CM | POA: Diagnosis not present

## 2022-05-07 DIAGNOSIS — N183 Chronic kidney disease, stage 3 unspecified: Secondary | ICD-10-CM | POA: Diagnosis not present

## 2022-05-07 DIAGNOSIS — J9611 Chronic respiratory failure with hypoxia: Secondary | ICD-10-CM | POA: Diagnosis not present

## 2022-05-22 DIAGNOSIS — J449 Chronic obstructive pulmonary disease, unspecified: Secondary | ICD-10-CM | POA: Diagnosis not present

## 2022-05-24 DIAGNOSIS — N3946 Mixed incontinence: Secondary | ICD-10-CM | POA: Diagnosis not present

## 2022-05-24 DIAGNOSIS — Z7409 Other reduced mobility: Secondary | ICD-10-CM | POA: Diagnosis not present

## 2022-05-24 DIAGNOSIS — R600 Localized edema: Secondary | ICD-10-CM | POA: Diagnosis not present

## 2022-05-24 DIAGNOSIS — N393 Stress incontinence (female) (male): Secondary | ICD-10-CM | POA: Diagnosis not present

## 2022-05-24 DIAGNOSIS — M6289 Other specified disorders of muscle: Secondary | ICD-10-CM | POA: Diagnosis not present

## 2022-06-05 DIAGNOSIS — G63 Polyneuropathy in diseases classified elsewhere: Secondary | ICD-10-CM | POA: Diagnosis not present

## 2022-06-05 DIAGNOSIS — I5032 Chronic diastolic (congestive) heart failure: Secondary | ICD-10-CM | POA: Diagnosis not present

## 2022-06-05 DIAGNOSIS — J9611 Chronic respiratory failure with hypoxia: Secondary | ICD-10-CM | POA: Diagnosis not present

## 2022-06-05 DIAGNOSIS — J449 Chronic obstructive pulmonary disease, unspecified: Secondary | ICD-10-CM | POA: Diagnosis not present

## 2022-06-05 DIAGNOSIS — M79604 Pain in right leg: Secondary | ICD-10-CM | POA: Diagnosis not present

## 2022-06-05 DIAGNOSIS — N183 Chronic kidney disease, stage 3 unspecified: Secondary | ICD-10-CM | POA: Diagnosis not present

## 2022-06-05 DIAGNOSIS — Z87891 Personal history of nicotine dependence: Secondary | ICD-10-CM | POA: Diagnosis not present

## 2022-06-06 DIAGNOSIS — N3946 Mixed incontinence: Secondary | ICD-10-CM | POA: Diagnosis not present

## 2022-06-06 DIAGNOSIS — M7918 Myalgia, other site: Secondary | ICD-10-CM | POA: Diagnosis not present

## 2022-06-06 DIAGNOSIS — Z7409 Other reduced mobility: Secondary | ICD-10-CM | POA: Diagnosis not present

## 2022-06-06 DIAGNOSIS — R600 Localized edema: Secondary | ICD-10-CM | POA: Diagnosis not present

## 2022-06-06 DIAGNOSIS — Z4689 Encounter for fitting and adjustment of other specified devices: Secondary | ICD-10-CM | POA: Diagnosis not present

## 2022-06-11 DIAGNOSIS — G4733 Obstructive sleep apnea (adult) (pediatric): Secondary | ICD-10-CM | POA: Diagnosis not present

## 2022-06-14 DIAGNOSIS — M79675 Pain in left toe(s): Secondary | ICD-10-CM | POA: Diagnosis not present

## 2022-06-14 DIAGNOSIS — L851 Acquired keratosis [keratoderma] palmaris et plantaris: Secondary | ICD-10-CM | POA: Diagnosis not present

## 2022-06-14 DIAGNOSIS — B351 Tinea unguium: Secondary | ICD-10-CM | POA: Diagnosis not present

## 2022-06-14 DIAGNOSIS — M79674 Pain in right toe(s): Secondary | ICD-10-CM | POA: Diagnosis not present

## 2022-07-04 ENCOUNTER — Other Ambulatory Visit (INDEPENDENT_AMBULATORY_CARE_PROVIDER_SITE_OTHER): Payer: Self-pay | Admitting: Nurse Practitioner

## 2022-07-04 DIAGNOSIS — I8000 Phlebitis and thrombophlebitis of superficial vessels of unspecified lower extremity: Secondary | ICD-10-CM

## 2022-07-04 DIAGNOSIS — M79604 Pain in right leg: Secondary | ICD-10-CM

## 2022-07-10 ENCOUNTER — Ambulatory Visit (INDEPENDENT_AMBULATORY_CARE_PROVIDER_SITE_OTHER): Payer: Medicare HMO

## 2022-07-10 ENCOUNTER — Ambulatory Visit (INDEPENDENT_AMBULATORY_CARE_PROVIDER_SITE_OTHER): Payer: Medicare HMO | Admitting: Nurse Practitioner

## 2022-07-10 ENCOUNTER — Encounter (INDEPENDENT_AMBULATORY_CARE_PROVIDER_SITE_OTHER): Payer: Self-pay | Admitting: Nurse Practitioner

## 2022-07-10 VITALS — BP 130/83 | HR 75 | Resp 18 | Ht 66.0 in | Wt 231.0 lb

## 2022-07-10 DIAGNOSIS — L03115 Cellulitis of right lower limb: Secondary | ICD-10-CM

## 2022-07-10 DIAGNOSIS — I5032 Chronic diastolic (congestive) heart failure: Secondary | ICD-10-CM | POA: Diagnosis not present

## 2022-07-10 DIAGNOSIS — M79604 Pain in right leg: Secondary | ICD-10-CM | POA: Diagnosis not present

## 2022-07-10 DIAGNOSIS — I10 Essential (primary) hypertension: Secondary | ICD-10-CM | POA: Diagnosis not present

## 2022-07-10 DIAGNOSIS — I8000 Phlebitis and thrombophlebitis of superficial vessels of unspecified lower extremity: Secondary | ICD-10-CM | POA: Diagnosis not present

## 2022-07-10 NOTE — Progress Notes (Signed)
Subjective:    Patient ID: Hannah Powell, female    DOB: 07-13-1945, 77 y.o.   MRN: [id number] Chief Complaint  Patient presents with   New Patient (Initial Visit)    Ref Graciela Husbands consult RLE pain/recurrent SVT    Hannah Powell is a 78 year old female who presents today as a referral from Dr. Graciela Husbands the patient had a recent episode of cellulitis which has been treated with antibiotics and resolved however she notes that this began approximately 2 years before.  She developed a blister on her right lower extremity which was slow to heal and remain for approximately a month.  It was draining and recur daily.  She notes that it finally resolved on its own and this is the same area where she has had the recurrent episodes of cellulitis.  The patient is currently on diuretics.  She notes that she travels frequently such as to [place] and during these long car rides she does not take her diuretics due to needing to stop frequently.  She notes that during these trips her legs are more swollen than usual.  She denies any open wounds or ulcerations or any weeping currently.  The patient also is experiencing manual lymphatic drainage.  She also wears medical grade compression stockings regularly.  Today noninvasive studies show no evidence of DVT or superficial thrombophlebitis bilaterally.  No evidence of deep venous insufficiency or superficial venous reflux bilaterally.    Review of Systems  Cardiovascular:  Positive for leg swelling.  Neurological:  Positive for weakness.  All other systems reviewed and are negative.      Objective:   Physical Exam Vitals reviewed.  HENT:     Head: Normocephalic.  Cardiovascular:     Rate and Rhythm: Normal rate.  Pulmonary:     Effort: Pulmonary effort is normal.  Musculoskeletal:     Right lower leg: Edema present.     Left lower leg: Edema present.  Skin:    General: Skin is warm and dry.  Neurological:     Mental Status: She is alert and oriented to  person, place, and time.  Psychiatric:        Mood and Affect: Mood normal.        Behavior: Behavior normal.        Thought Content: Thought content normal.        Judgment: Judgment normal.     BP 130/83 (BP Location: Right Arm)   Pulse 75   Resp 18   Ht 5\' 6"  (1.676 m)   Wt 231 lb (104.8 kg)   BMI 37.28 kg/m   Past Medical History:  Diagnosis Date   Anxiety    Arthritis    CHF (congestive heart failure)    COPD (chronic obstructive pulmonary disease)    Dysplastic nevus 04/22/2019   R abdomen parallel to sup umbilicus - mod   Dysplastic nevus 04/22/2019   R low back above waistline - mild    Edema    feet/legs   Hypertension    dr Darryll Capers      Hypothyroidism    Leg weakness, bilateral    Osteoporosis    Oxygen deficit    2l with bipap at night   RLS (restless legs syndrome)    Shortness of breath    Sleep apnea    bipap   Wheezing     Social History   Socioeconomic History   Marital status: Married    Spouse name: Not on file  Number of children: Not on file   Years of education: Not on file   Highest education level: Not on file  Occupational History   Not on file  Tobacco Use   Smoking status: Former    Packs/day: 1.00    Years: 10.00    Additional pack years: 0.00    Total pack years: 10.00    Types: Cigarettes    Quit date: 03/20/1975    Years since quitting: 47.3   Smokeless tobacco: Never  Vaping Use   Vaping Use: Never used  Substance and Sexual Activity   Alcohol use: No   Drug use: No   Sexual activity: Not on file  Other Topics Concern   Not on file  Social History Narrative   Not on file   Social Determinants of Health   Financial Resource Strain: Not on file  Food Insecurity: No Food Insecurity (03/29/2022)   Hunger Vital Sign    Worried About Running Out of Food in the Last Year: Never true    Ran Out of Food in the Last Year: Never true  Transportation Needs: No Transportation Needs (03/29/2022)   PRAPARE -  Administrator, Civil Service (Medical): No    Lack of Transportation (Non-Medical): No  Physical Activity: Not on file  Stress: Not on file  Social Connections: Not on file  Intimate Partner Violence: Not At Risk (03/29/2022)   Humiliation, Afraid, Rape, and Kick questionnaire    Fear of Current or Ex-Partner: No    Emotionally Abused: No    Physically Abused: No    Sexually Abused: No    Past Surgical History:  Procedure Laterality Date   BACK SURGERY     cervical   BREAST BIOPSY Left    needle bx-neg   CATARACT EXTRACTION W/PHACO Left 05/07/2017   Procedure: CATARACT EXTRACTION PHACO AND INTRAOCULAR LENS PLACEMENT (IOC);  Surgeon: Galen Manila, MD;  Location: [facility];  Service: Ophthalmology;  Laterality: Left;  [place] 00:48.7 AP% 11.3 CDE 5.46 Fluid Pack Lot # [id number]   CATARACT EXTRACTION W/PHACO Right 04/23/2017   Procedure: CATARACT EXTRACTION PHACO AND INTRAOCULAR LENS PLACEMENT (IOC);  Surgeon: Galen Manila, MD;  Location: [facility];  Service: Ophthalmology;  Laterality: Right;  [place] 00:29 AP% 12.8 CDE 3.79 FLUID PACK LOT # [id number]   FOOT ARTHROPLASTY     JOINT REPLACEMENT     x2 tkr   TOTAL SHOULDER ARTHROPLASTY Left 07/02/2013   Procedure: LEFT TOTAL SHOULDER ARTHROPLASTY;  Surgeon: Senaida Lange, MD;  Location: [facility];  Service: Orthopedics;  Laterality: Left;   TOTAL SHOULDER ARTHROPLASTY Right 04/10/2018   Procedure: TOTAL REVERSE SHOULDER ARTHROPLASTY;  Surgeon: Francena Hanly, MD;  Location: [facility];  Service: Orthopedics;  Laterality: Right;     Family History  Problem Relation Age of Onset   Hypertension Mother    Diabetes Mother    Heart failure Father    Breast cancer Neg Hx     Allergies  Allergen Reactions   Lyrica [Pregabalin] Other (See Comments)    Reaction: made Restless Leg Syndrome worse        Latest Ref Rng & Units 03/30/2022    5:09 AM 03/29/2022    1:03 PM 02/09/2022    7:06 AM  CBC  WBC 4.0 - 10.5 K/uL 5.0  8.0   7.5   Hemoglobin 12.0 - 15.0 g/dL 19.1  47.8  29.5   Hematocrit 36.0 - 46.0 % 43.4  47.2  41.7  Platelets 150 - 400 K/uL 172  165  181       CMP     Component Value Date/Time   NA 141 03/30/2022 0509   NA 141 07/03/2014 0501   K 4.2 03/30/2022 0509   K 3.9 07/03/2014 0501   CL 103 03/30/2022 0509   CL 101 07/03/2014 0501   CO2 32 03/30/2022 0509   CO2 37 (H) 07/03/2014 0501   GLUCOSE 156 (H) 03/30/2022 0509   GLUCOSE 109 (H) 07/03/2014 0501   BUN 23 03/30/2022 0509   BUN 24 (H) 07/03/2014 0501   CREATININE 0.88 03/30/2022 0509   CREATININE 0.58 07/03/2014 0501   CALCIUM 8.6 (L) 03/30/2022 0509   CALCIUM 8.8 (L) 07/03/2014 0501   PROT 6.3 (L) 08/01/2016 0149   PROT 6.7 06/30/2014 1710   ALBUMIN 3.3 (L) 08/01/2016 0149   ALBUMIN 3.8 06/30/2014 1710   AST 27 08/01/2016 0149   AST 24 06/30/2014 1710   ALT 18 08/01/2016 0149   ALT 17 06/30/2014 1710   ALKPHOS 74 08/01/2016 0149   ALKPHOS 121 06/30/2014 1710   BILITOT 0.8 08/01/2016 0149   BILITOT 0.8 06/30/2014 1710   GFRNONAA >60 03/30/2022 0509   GFRNONAA >60 07/03/2014 0501   GFRAA >60 04/04/2018 1201   GFRAA >60 07/03/2014 0501     No results found.     Assessment & Plan:   1. Cellulitis of right lower extremity This is resolved.  I suspect that the cellulitis may be related to multifactoral issue for the patient.  She has some significant edema which tends to be worse after her travels, and she previously had a wound which I suspect plays a role in the recurrent cellulitis.  We discussed optimization of medical therapy including use of 20 to 30 mmHg compression socks which should be worn daily, especially when traveling, elevation and activity as tolerable.  The patient is also skilled in doing manual lymphatic drainage and we talked about how this would be useful to implement her regimen as well to help with control of her lower extremity edema.  We discussed lymphedema pump however at this time because the  patient is skilled and utilizing manual lymphatic drainage, we will not move forward with this at this time.  Will plan on having the patient follow-up with [place] on an as-needed basis unless she begins to have recurrent cellulitis, or open wounds.  2. Primary hypertension Continue antihypertensive medications as already ordered, these medications have been reviewed and there are no changes at this time.  3. Chronic diastolic heart failure This is also likely contributing factor to her ongoing lower extremity edema   Current Outpatient Medications on File Prior to Visit  Medication Sig Dispense Refill   albuterol (PROVENTIL) (2.5 MG/3ML) 0.083% nebulizer solution Take 2.5 mg by nebulization every 6 (six) hours as needed for wheezing or shortness of breath.     ALPRAZolam (XANAX) 0.25 MG tablet Take 1 tablet (0.25 mg total) by mouth 3 (three) times daily as needed for anxiety. 30 tablet 0   aspirin EC 81 MG tablet Take by mouth daily as needed.     azelastine (ASTELIN) 0.1 % nasal spray      azithromycin (ZITHROMAX) 250 MG tablet Take by mouth.     B Complex-C (B-COMPLEX WITH VITAMIN C) tablet Take 1 tablet by mouth daily.      calcium carbonate (OSCAL) 1500 (600 Ca) MG TABS tablet Take by mouth.     chlorpheniramine-HYDROcodone (TUSSIONEX) 10-8 MG/5ML  SMARTSIG:5 Milliliter(s) By Mouth Every 12 Hours PRN     COMBIVENT RESPIMAT 20-100 MCG/ACT AERS respimat Inhale 1 puff into the lungs 4 (four) times daily.     ferrous sulfate 325 (65 FE) MG tablet Take 325 mg by mouth daily with breakfast.     fluticasone furoate-vilanterol (BREO ELLIPTA) 200-25 MCG/INH AEPB Inhale 1 puff into the lungs daily. 60 each 3   ibuprofen (ADVIL) 800 MG tablet Take by mouth.     ibuprofen (ADVIL,MOTRIN) 200 MG tablet Take 800 mg by mouth every 6 (six) hours as needed for headache or mild pain.      ipratropium-albuterol (DUONEB) 0.5-2.5 (3) MG/3ML SOLN Take 3 mLs by nebulization every 4 (four) hours as needed.      ketoconazole (NIZORAL) 2 % cream Apply once daily under the breast and left under arm. 60 g 1   levothyroxine (SYNTHROID, LEVOTHROID) 200 MCG tablet Take 250 mcg by mouth daily before breakfast.     loratadine-pseudoephedrine (CLARITIN-D 24-HOUR) 10-240 MG 24 hr tablet Take 1 tablet by mouth daily as needed for allergies.     modafinil (PROVIGIL) 100 MG tablet Take 100 mg by mouth daily.     Multiple Vitamins-Minerals (MULTIVITAMIN WOMEN PO) Take 1 tablet by mouth daily.     Omega-3 Fatty Acids (OMEGA 3 PO) Take 1 capsule by mouth daily.     potassium chloride SA (K-DUR,KLOR-CON) 20 MEQ tablet Take 20 mEq by mouth 2 (two) times daily.     pramipexole (MIRAPEX) 1 MG tablet Take 2 tablets (2 mg total) by mouth at bedtime.     spironolactone (ALDACTONE) 25 MG tablet Take 1 tablet by mouth 2 (two) times daily.     topiramate (TOPAMAX) 100 MG tablet Take 100 mg by mouth daily.     traMADol (ULTRAM) 50 MG tablet Take 1 tablet (50 mg total) by mouth every 8 (eight) hours as needed (for mild pain). 30 tablet 0   VENTOLIN HFA 108 (90 Base) MCG/ACT inhaler Inhale 2 puffs into the lungs every 6 (six) hours as needed.     zolpidem (AMBIEN) 10 MG tablet Take 5 mg by mouth at bedtime as needed for sleep.     furosemide (LASIX) 80 MG tablet Take 80 mg by mouth 2 (two) times daily.     levalbuterol (XOPENEX) 0.31 MG/3ML nebulizer solution Inhale into the lungs.     losartan (COZAAR) 25 MG tablet Take 25 mg by mouth daily. (Patient not taking: Reported on 07/10/2022)     oxybutynin (DITROPAN) 5 MG tablet Take 1 tablet by mouth 3 (three) times daily. (Patient not taking: Reported on 07/10/2022)     No current facility-administered medications on file prior to visit.    There are no Patient Instructions on file for this visit. No follow-ups on file.   Georgiana Spinner, NP

## 2022-07-19 ENCOUNTER — Encounter (INDEPENDENT_AMBULATORY_CARE_PROVIDER_SITE_OTHER): Payer: Medicare HMO

## 2022-07-19 ENCOUNTER — Encounter (INDEPENDENT_AMBULATORY_CARE_PROVIDER_SITE_OTHER): Payer: Medicare HMO | Admitting: Vascular Surgery

## 2022-08-03 DIAGNOSIS — I5032 Chronic diastolic (congestive) heart failure: Secondary | ICD-10-CM | POA: Diagnosis not present

## 2022-08-03 DIAGNOSIS — I878 Other specified disorders of veins: Secondary | ICD-10-CM | POA: Diagnosis not present

## 2022-08-03 DIAGNOSIS — L03115 Cellulitis of right lower limb: Secondary | ICD-10-CM | POA: Diagnosis not present

## 2022-08-14 DIAGNOSIS — Z4689 Encounter for fitting and adjustment of other specified devices: Secondary | ICD-10-CM | POA: Diagnosis not present

## 2022-08-14 DIAGNOSIS — N952 Postmenopausal atrophic vaginitis: Secondary | ICD-10-CM | POA: Diagnosis not present

## 2022-08-14 DIAGNOSIS — T8389XA Other specified complication of genitourinary prosthetic devices, implants and grafts, initial encounter: Secondary | ICD-10-CM | POA: Diagnosis not present

## 2022-08-14 DIAGNOSIS — Z7409 Other reduced mobility: Secondary | ICD-10-CM | POA: Diagnosis not present

## 2022-08-14 DIAGNOSIS — R600 Localized edema: Secondary | ICD-10-CM | POA: Diagnosis not present

## 2022-08-14 DIAGNOSIS — N3946 Mixed incontinence: Secondary | ICD-10-CM | POA: Diagnosis not present

## 2022-08-14 DIAGNOSIS — M7918 Myalgia, other site: Secondary | ICD-10-CM | POA: Diagnosis not present

## 2022-08-22 DIAGNOSIS — J449 Chronic obstructive pulmonary disease, unspecified: Secondary | ICD-10-CM | POA: Diagnosis not present

## 2022-08-26 ENCOUNTER — Emergency Department: Payer: Medicare HMO

## 2022-08-26 ENCOUNTER — Observation Stay: Payer: Medicare HMO

## 2022-08-26 ENCOUNTER — Inpatient Hospital Stay
Admission: EM | Admit: 2022-08-26 | Discharge: 2022-08-29 | DRG: 291 | Disposition: A | Discharge status: Home Health | Payer: Medicare HMO | Attending: Internal Medicine | Admitting: Internal Medicine

## 2022-08-26 ENCOUNTER — Encounter: Payer: Self-pay | Admitting: Emergency Medicine

## 2022-08-26 ENCOUNTER — Other Ambulatory Visit: Payer: Self-pay

## 2022-08-26 DIAGNOSIS — I5033 Acute on chronic diastolic (congestive) heart failure: Secondary | ICD-10-CM | POA: Diagnosis not present

## 2022-08-26 DIAGNOSIS — Z96611 Presence of right artificial shoulder joint: Secondary | ICD-10-CM | POA: Diagnosis present

## 2022-08-26 DIAGNOSIS — M810 Age-related osteoporosis without current pathological fracture: Secondary | ICD-10-CM | POA: Diagnosis present

## 2022-08-26 DIAGNOSIS — M79604 Pain in right leg: Principal | ICD-10-CM

## 2022-08-26 DIAGNOSIS — I509 Heart failure, unspecified: Secondary | ICD-10-CM | POA: Insufficient documentation

## 2022-08-26 DIAGNOSIS — Z8249 Family history of ischemic heart disease and other diseases of the circulatory system: Secondary | ICD-10-CM

## 2022-08-26 DIAGNOSIS — T502X5A Adverse effect of carbonic-anhydrase inhibitors, benzothiadiazides and other diuretics, initial encounter: Secondary | ICD-10-CM | POA: Diagnosis present

## 2022-08-26 DIAGNOSIS — Z87891 Personal history of nicotine dependence: Secondary | ICD-10-CM

## 2022-08-26 DIAGNOSIS — M5412 Radiculopathy, cervical region: Secondary | ICD-10-CM | POA: Diagnosis not present

## 2022-08-26 DIAGNOSIS — Z888 Allergy status to other drugs, medicaments and biological substances status: Secondary | ICD-10-CM | POA: Diagnosis not present

## 2022-08-26 DIAGNOSIS — Z79818 Long term (current) use of other agents affecting estrogen receptors and estrogen levels: Secondary | ICD-10-CM

## 2022-08-26 DIAGNOSIS — Z981 Arthrodesis status: Secondary | ICD-10-CM

## 2022-08-26 DIAGNOSIS — I10 Essential (primary) hypertension: Secondary | ICD-10-CM | POA: Diagnosis present

## 2022-08-26 DIAGNOSIS — J449 Chronic obstructive pulmonary disease, unspecified: Secondary | ICD-10-CM | POA: Diagnosis not present

## 2022-08-26 DIAGNOSIS — Z79899 Other long term (current) drug therapy: Secondary | ICD-10-CM

## 2022-08-26 DIAGNOSIS — E876 Hypokalemia: Secondary | ICD-10-CM | POA: Diagnosis not present

## 2022-08-26 DIAGNOSIS — E039 Hypothyroidism, unspecified: Secondary | ICD-10-CM | POA: Diagnosis present

## 2022-08-26 DIAGNOSIS — I878 Other specified disorders of veins: Secondary | ICD-10-CM | POA: Diagnosis present

## 2022-08-26 DIAGNOSIS — Z833 Family history of diabetes mellitus: Secondary | ICD-10-CM

## 2022-08-26 DIAGNOSIS — M19032 Primary osteoarthritis, left wrist: Secondary | ICD-10-CM | POA: Diagnosis present

## 2022-08-26 DIAGNOSIS — R531 Weakness: Secondary | ICD-10-CM | POA: Diagnosis not present

## 2022-08-26 DIAGNOSIS — G4730 Sleep apnea, unspecified: Secondary | ICD-10-CM | POA: Diagnosis present

## 2022-08-26 DIAGNOSIS — J811 Chronic pulmonary edema: Secondary | ICD-10-CM | POA: Diagnosis not present

## 2022-08-26 DIAGNOSIS — Z96612 Presence of left artificial shoulder joint: Secondary | ICD-10-CM | POA: Diagnosis present

## 2022-08-26 DIAGNOSIS — I5021 Acute systolic (congestive) heart failure: Secondary | ICD-10-CM | POA: Diagnosis not present

## 2022-08-26 DIAGNOSIS — M7989 Other specified soft tissue disorders: Secondary | ICD-10-CM | POA: Diagnosis present

## 2022-08-26 DIAGNOSIS — I952 Hypotension due to drugs: Secondary | ICD-10-CM | POA: Diagnosis not present

## 2022-08-26 DIAGNOSIS — Z7982 Long term (current) use of aspirin: Secondary | ICD-10-CM | POA: Diagnosis not present

## 2022-08-26 DIAGNOSIS — Z96653 Presence of artificial knee joint, bilateral: Secondary | ICD-10-CM | POA: Diagnosis present

## 2022-08-26 DIAGNOSIS — R609 Edema, unspecified: Secondary | ICD-10-CM | POA: Diagnosis not present

## 2022-08-26 DIAGNOSIS — M545 Low back pain, unspecified: Secondary | ICD-10-CM | POA: Diagnosis not present

## 2022-08-26 DIAGNOSIS — Z79890 Hormone replacement therapy (postmenopausal): Secondary | ICD-10-CM

## 2022-08-26 DIAGNOSIS — G4733 Obstructive sleep apnea (adult) (pediatric): Secondary | ICD-10-CM | POA: Diagnosis not present

## 2022-08-26 DIAGNOSIS — M1812 Unilateral primary osteoarthritis of first carpometacarpal joint, left hand: Secondary | ICD-10-CM | POA: Diagnosis not present

## 2022-08-26 DIAGNOSIS — R600 Localized edema: Secondary | ICD-10-CM | POA: Diagnosis not present

## 2022-08-26 DIAGNOSIS — I110 Hypertensive heart disease with heart failure: Secondary | ICD-10-CM | POA: Diagnosis not present

## 2022-08-26 DIAGNOSIS — M79605 Pain in left leg: Secondary | ICD-10-CM | POA: Diagnosis not present

## 2022-08-26 DIAGNOSIS — J9811 Atelectasis: Secondary | ICD-10-CM | POA: Diagnosis not present

## 2022-08-26 DIAGNOSIS — G2581 Restless legs syndrome: Secondary | ICD-10-CM | POA: Diagnosis not present

## 2022-08-26 LAB — CBC WITH DIFFERENTIAL/PLATELET
Abs Immature Granulocytes: 0.04 10*3/uL (ref 0.00–0.07)
Basophils Absolute: 0 10*3/uL (ref 0.0–0.1)
Basophils Relative: 1 %
Eosinophils Absolute: 0.4 10*3/uL (ref 0.0–0.5)
Eosinophils Relative: 6 %
HCT: 44.6 % (ref 36.0–46.0)
Hemoglobin: 13.7 g/dL (ref 12.0–15.0)
Immature Granulocytes: 1 %
Lymphocytes Relative: 16 %
Lymphs Abs: 1 10*3/uL (ref 0.7–4.0)
MCH: 30.2 pg (ref 26.0–34.0)
MCHC: 30.7 g/dL (ref 30.0–36.0)
MCV: 98.5 fL (ref 80.0–100.0)
Monocytes Absolute: 0.5 10*3/uL (ref 0.1–1.0)
Monocytes Relative: 9 %
Neutro Abs: 4.2 10*3/uL (ref 1.7–7.7)
Neutrophils Relative %: 67 %
Platelets: 188 10*3/uL (ref 150–400)
RBC: 4.53 MIL/uL (ref 3.87–5.11)
RDW: 13.5 % (ref 11.5–15.5)
WBC: 6.1 10*3/uL (ref 4.0–10.5)
nRBC: 0 % (ref 0.0–0.2)

## 2022-08-26 LAB — COMPREHENSIVE METABOLIC PANEL
ALT: 13 U/L (ref 0–44)
AST: 24 U/L (ref 15–41)
Albumin: 4 g/dL (ref 3.5–5.0)
Alkaline Phosphatase: 103 U/L (ref 38–126)
Anion gap: 8 (ref 5–15)
BUN: 34 mg/dL — ABNORMAL HIGH (ref 8–23)
CO2: 30 mmol/L (ref 22–32)
Calcium: 9.1 mg/dL (ref 8.9–10.3)
Chloride: 104 mmol/L (ref 98–111)
Creatinine, Ser: 1.05 mg/dL — ABNORMAL HIGH (ref 0.44–1.00)
GFR, Estimated: 55 mL/min — ABNORMAL LOW (ref >60)
Glucose, Bld: 93 mg/dL (ref 70–99)
Potassium: 3.9 mmol/L (ref 3.5–5.1)
Sodium: 142 mmol/L (ref 135–145)
Total Bilirubin: 0.2 mg/dL — ABNORMAL LOW (ref 0.3–1.2)
Total Protein: 6.8 g/dL (ref 6.5–8.1)

## 2022-08-26 LAB — BRAIN NATRIURETIC PEPTIDE: B Natriuretic Peptide: 189.2 pg/mL — ABNORMAL HIGH (ref 0.0–100.0)

## 2022-08-26 MED ORDER — ONDANSETRON HCL 4 MG/2ML IJ SOLN: 4.0000 mg | Freq: Four times a day (QID) | INTRAMUSCULAR | Status: DC | PRN

## 2022-08-26 MED ORDER — LOSARTAN POTASSIUM 50 MG PO TABS
25.0000 mg | ORAL_TABLET | Freq: Every day | ORAL | Status: DC
  Administered 2022-08-26: 25 mg via ORAL
  Filled 2022-08-26: qty 1

## 2022-08-26 MED ORDER — ONDANSETRON HCL 4 MG PO TABS: 4.0000 mg | ORAL_TABLET | Freq: Four times a day (QID) | ORAL | Status: DC | PRN

## 2022-08-26 MED ORDER — SODIUM CHLORIDE 0.9 % IV SOLN: 250.0000 mL | INTRAVENOUS | Status: DC | PRN

## 2022-08-26 MED ORDER — TOPIRAMATE 100 MG PO TABS
100.0000 mg | ORAL_TABLET | Freq: Every day | ORAL | Status: DC
  Administered 2022-08-26 – 2022-08-29 (×4): 100 mg via ORAL
  Filled 2022-08-26: qty 1
  Filled 2022-08-26 (×3): qty 4

## 2022-08-26 MED ORDER — TRAZODONE HCL 50 MG PO TABS
50.0000 mg | ORAL_TABLET | Freq: Every evening | ORAL | Status: DC | PRN
  Administered 2022-08-26 – 2022-08-27 (×2): 50 mg via ORAL
  Filled 2022-08-26 (×2): qty 1

## 2022-08-26 MED ORDER — FUROSEMIDE 10 MG/ML IJ SOLN
80.0000 mg | Freq: Once | INTRAMUSCULAR | Status: AC
  Administered 2022-08-26: 80 mg via INTRAVENOUS
  Filled 2022-08-26: qty 8

## 2022-08-26 MED ORDER — ADULT MULTIVITAMIN W/MINERALS CH
1.0000 | ORAL_TABLET | Freq: Every day | ORAL | Status: DC
  Administered 2022-08-26 – 2022-08-29 (×4): 1 via ORAL
  Filled 2022-08-26 (×4): qty 1

## 2022-08-26 MED ORDER — ENOXAPARIN SODIUM 60 MG/0.6ML IJ SOSY
50.0000 mg | PREFILLED_SYRINGE | INTRAMUSCULAR | Status: DC
  Administered 2022-08-26 – 2022-08-28 (×3): 50 mg via SUBCUTANEOUS
  Filled 2022-08-26 (×3): qty 0.6

## 2022-08-26 MED ORDER — ALBUTEROL SULFATE (2.5 MG/3ML) 0.083% IN NEBU: 2.5000 mg | INHALATION_SOLUTION | Freq: Four times a day (QID) | RESPIRATORY_TRACT | Status: DC | PRN

## 2022-08-26 MED ORDER — LEVOTHYROXINE SODIUM 50 MCG PO TABS
150.0000 ug | ORAL_TABLET | Freq: Every day | ORAL | Status: DC
  Administered 2022-08-27 – 2022-08-29 (×3): 150 ug via ORAL
  Filled 2022-08-26 (×3): qty 1

## 2022-08-26 MED ORDER — FUROSEMIDE 10 MG/ML IJ SOLN: 80.0000 mg | Freq: Once | INTRAMUSCULAR | Status: DC

## 2022-08-26 MED ORDER — IPRATROPIUM-ALBUTEROL 0.5-2.5 (3) MG/3ML IN SOLN: 3.0000 mL | Freq: Four times a day (QID) | RESPIRATORY_TRACT | Status: DC | PRN

## 2022-08-26 MED ORDER — TRAMADOL HCL 50 MG PO TABS
50.0000 mg | ORAL_TABLET | Freq: Three times a day (TID) | ORAL | Status: DC | PRN
  Administered 2022-08-26 – 2022-08-29 (×5): 50 mg via ORAL
  Filled 2022-08-26 (×5): qty 1

## 2022-08-26 MED ORDER — FLUTICASONE FUROATE-VILANTEROL 200-25 MCG/ACT IN AEPB
1.0000 | INHALATION_SPRAY | Freq: Every day | RESPIRATORY_TRACT | Status: DC
  Administered 2022-08-27 – 2022-08-29 (×3): 1 via RESPIRATORY_TRACT
  Filled 2022-08-26 (×2): qty 28

## 2022-08-26 MED ORDER — SPIRONOLACTONE 25 MG PO TABS
25.0000 mg | ORAL_TABLET | Freq: Two times a day (BID) | ORAL | Status: DC
  Administered 2022-08-26 (×2): 25 mg via ORAL
  Filled 2022-08-26 (×2): qty 1

## 2022-08-26 MED ORDER — SODIUM CHLORIDE 0.9% FLUSH
3.0000 mL | Freq: Two times a day (BID) | INTRAVENOUS | Status: DC
  Administered 2022-08-26 – 2022-08-29 (×6): 3 mL via INTRAVENOUS

## 2022-08-26 MED ORDER — SODIUM CHLORIDE 0.9% FLUSH: 3.0000 mL | INTRAVENOUS | Status: DC | PRN

## 2022-08-26 MED ORDER — FUROSEMIDE 10 MG/ML IJ SOLN
80.0000 mg | Freq: Once | INTRAMUSCULAR | Status: AC
  Administered 2022-08-27: 80 mg via INTRAVENOUS
  Filled 2022-08-26: qty 8

## 2022-08-26 MED ORDER — OMEGA-3-ACID ETHYL ESTERS 1 G PO CAPS
1.0000 | ORAL_CAPSULE | Freq: Every day | ORAL | Status: DC
  Administered 2022-08-26 – 2022-08-29 (×4): 1 g via ORAL
  Filled 2022-08-26 (×4): qty 1

## 2022-08-26 MED ORDER — MODAFINIL 100 MG PO TABS
100.0000 mg | ORAL_TABLET | Freq: Every day | ORAL | Status: DC
  Administered 2022-08-26 – 2022-08-29 (×4): 100 mg via ORAL
  Filled 2022-08-26 (×4): qty 1

## 2022-08-26 MED ORDER — PRAMIPEXOLE DIHYDROCHLORIDE 1 MG PO TABS
2.0000 mg | ORAL_TABLET | Freq: Every day | ORAL | Status: DC
  Administered 2022-08-26 – 2022-08-28 (×3): 2 mg via ORAL
  Filled 2022-08-26 (×3): qty 2

## 2022-08-26 MED ORDER — ASPIRIN 81 MG PO TBEC
81.0000 mg | DELAYED_RELEASE_TABLET | Freq: Every day | ORAL | Status: DC
  Administered 2022-08-26 – 2022-08-29 (×4): 81 mg via ORAL
  Filled 2022-08-26 (×4): qty 1

## 2022-08-26 NOTE — ED Provider Notes (Signed)
[facility] Provider Note    Event Date/Time   First MD Initiated Contact with Patient 08/26/22 0720     (approximate)   History   Leg Pain   HPI  Hannah Powell is a 77 y.o. female with a history of chronic diastolic heart failure, COPD who presents with increasing lower extremity swelling which is worsened over the last week with increasing pain and difficulty ambulating.  She denies shortness of breath at this time.  No fevers reported.  She does have redness and pain to the legs bilaterally in the lower legs.  Family reports over the last 24 hours she has been unable to ambulate on her own     Physical Exam   Triage Vital Signs: ED Triage Vitals  Enc Vitals Group     BP 08/26/22 0641 123/72     Pulse Rate 08/26/22 0641 80     Resp 08/26/22 0641 18     Temp 08/26/22 0641 97.8 F (36.6 C)     Temp Source 08/26/22 0641 Oral     SpO2 08/26/22 0641 92 %     Weight 08/26/22 0639 104.3 kg (229 lb 15 oz)     Height 08/26/22 0639 1.676 m (5\' 6" )     Head Circumference --      Peak Flow --      Pain Score 08/26/22 0639 10     Pain Loc --      Pain Edu? --      Excl. in GC? --     Most recent vital signs: Vitals:   08/26/22 0641  BP: 123/72  Pulse: 80  Resp: 18  Temp: 97.8 F (36.6 C)  SpO2: 92%     General: Awake, no distress.  CV:  Good peripheral perfusion.  Resp:  Normal effort.  Abd:  No distention.  Other:  Significant edema to the bilateral lower extremities with mild erythema to the anterior lower leg bilaterally with some tenderness but not consistent with cellulitis, no skin break, no streaking   ED Results / Procedures / Treatments   Labs (all labs ordered are listed, but only abnormal results are displayed) Labs Reviewed  COMPREHENSIVE METABOLIC PANEL - Abnormal; Notable for the following components:      Result Value   BUN 34 (*)    Creatinine, Ser 1.05 (*)    Total Bilirubin 0.2 (*)    GFR, Estimated 55 (*)     All other components within normal limits  BRAIN NATRIURETIC PEPTIDE - Abnormal; Notable for the following components:   B Natriuretic Peptide 189.2 (*)    All other components within normal limits  CBC WITH DIFFERENTIAL/PLATELET     EKG  ED ECG REPORT I, Jene Every, the attending physician, personally viewed and interpreted this ECG.  Date: 08/26/2022  Rhythm: normal sinus rhythm QRS Axis: normal Intervals: Abnormal ST/T Wave abnormalities: normal Narrative Interpretation: Nonspecific changes, question A-V block    RADIOLOGY Chest x-ray viewed interpreted by me consistent with pulmonary vascular congestion    PROCEDURES:  Critical Care performed:   Procedures   MEDICATIONS ORDERED IN ED: Medications  furosemide (LASIX) injection 80 mg (80 mg Intravenous Given 08/26/22 0808)     IMPRESSION / MDM / ASSESSMENT AND PLAN / ED COURSE  I reviewed the triage vital signs and the nursing notes. Patient's presentation is most consistent with severe exacerbation of chronic illness.  Patient with history of diastolic congestive heart failure, worsening lower extremity swelling  over the last 24 hours.  Unable to ambulate, she has been taking her Lasix 80 mg twice daily.  She used to go to [facility] cardiology but her cardiologist retired  Lab work reviewed and is overall reassuring, normal kidney function.  Will treat with IV Lasix 80 mg, she will require admission for diuresis, PT  I discussed with the hospitalist Dr. Alvester Morin for admission      FINAL CLINICAL IMPRESSION(S) / ED DIAGNOSES   Final diagnoses:  Bilateral leg pain  Lower extremity edema  Acute on chronic diastolic congestive heart failure (HCC)     Rx / DC Orders   ED Discharge Orders     None        Note:  This document was prepared using Dragon voice recognition software and may include unintentional dictation errors.   Jene Every, MD 08/26/22 [phone]

## 2022-08-26 NOTE — Assessment & Plan Note (Addendum)
Acute decompensated diastolic heart failure exacerbation with noted significantly worsening lower extremity swelling and weakness over multiple weeks On high-dose diuretic with 80 mg twice daily of Lasix at home Marked pitting edema in lower extremities bilaterally with mild venous stasis changes  Suspect concomitant high-dose regular NSAID use is likely trigger BNP 190 Chest x-ray with cardiomegaly and vascular congestion Status post 80 mg of IV Lasix in ER with good urine output thus far Will formally consult Dr. Darrold Junker for recommendations  Stressed NSAID avoidance Follow

## 2022-08-26 NOTE — Assessment & Plan Note (Signed)
BP stable Titrate home regimen 

## 2022-08-26 NOTE — ED Notes (Signed)
Ultrasound remains at bedside. Family at bedside.

## 2022-08-26 NOTE — ED Notes (Signed)
See triage note.

## 2022-08-26 NOTE — H&P (Signed)
History and Physical    Patient: Hannah Powell WUJ:[id number] DOB: Feb 10, 1946 DOA: 08/26/2022 DOS: the patient was seen and examined on 08/26/2022 PCP: Lynnea Ferrier, MD  Patient coming from: Home  Chief Complaint:  Chief Complaint  Patient presents with   Leg Pain   HPI: Hannah Powell is a 77 y.o. female with medical history significant of morbid obesity, COPD, diastolic CHF, OSA on BiPAP presenting with acute diastolic CHF exacerbation, lower extremity weakness.  Patient reports worsening lower extremity weakness and swelling over the past 2 weeks or so.  Minimal orthopnea and PND.  No chest pain.  No abdominal pain, nausea or vomiting.  No back pain, numbness or tingling.  Prior patient of Dr. Philemon Kingdom with [facility].  Has not seen a cardiologist since her last evaluation with him.  Takes 80 mg p.o. Lasix twice a day.  No missed doses.  Patient does admit to eating some salty food.  Also reports regular ibuprofen use for generalized pain.  No fevers or chills.  Has had marked lower extremity swelling over the past week or so with inability to ambulate secondary to weakness and pain. Presented to the ER afebrile, hemodynamically stable.  White count 6.1, hemoglobin 13.7, platelets 188.  Creatinine 1.05.  BNP 190.  Chest x-ray with cardiomegaly and increased pulmonary vascular congestion.  Given 80 mg IV Lasix in the ER. Review of Systems: As mentioned in the history of present illness. All other systems reviewed and are negative. Past Medical History:  Diagnosis Date   Anxiety    Arthritis    CHF (congestive heart failure) (HCC)    COPD (chronic obstructive pulmonary disease) (HCC)    Dysplastic nevus 04/22/2019   R abdomen parallel to sup umbilicus - mod   Dysplastic nevus 04/22/2019   R low back above waistline - mild    Edema    feet/legs   Hypertension    dr Darryll Capers      Hypothyroidism    Leg weakness, bilateral    Osteoporosis    Oxygen deficit    2l with  bipap at night   RLS (restless legs syndrome)    Shortness of breath    Sleep apnea    bipap   Wheezing    Past Surgical History:  Procedure Laterality Date   BACK SURGERY     cervical   BREAST BIOPSY Left    needle bx-neg   CATARACT EXTRACTION W/PHACO Left 05/07/2017   Procedure: CATARACT EXTRACTION PHACO AND INTRAOCULAR LENS PLACEMENT (IOC);  Surgeon: Galen Manila, MD;  Location: [facility] ORS;  Service: Ophthalmology;  Laterality: Left;  [place] 00:48.7 AP% 11.3 CDE 5.46 Fluid Pack Lot # [id number]   CATARACT EXTRACTION W/PHACO Right 04/23/2017   Procedure: CATARACT EXTRACTION PHACO AND INTRAOCULAR LENS PLACEMENT (IOC);  Surgeon: Galen Manila, MD;  Location: [facility];  Service: Ophthalmology;  Laterality: Right;  [place] 00:29 AP% 12.8 CDE 3.79 FLUID PACK LOT # [id number]   FOOT ARTHROPLASTY     JOINT REPLACEMENT     x2 tkr   TOTAL SHOULDER ARTHROPLASTY Left 07/02/2013   Procedure: LEFT TOTAL SHOULDER ARTHROPLASTY;  Surgeon: Senaida Lange, MD;  Location: [facility];  Service: Orthopedics;  Laterality: Left;   TOTAL SHOULDER ARTHROPLASTY Right 04/10/2018   Procedure: TOTAL REVERSE SHOULDER ARTHROPLASTY;  Surgeon: Francena Hanly, MD;  Location: [facility];  Service: Orthopedics;  Laterality: Right;    Social History:  reports that she quit smoking about 47 years ago. She has  a 10.00 pack-year smoking history. She has never used smokeless tobacco. She reports that she does not drink alcohol and does not use drugs.  Allergies  Allergen Reactions   Lyrica [Pregabalin] Other (See Comments)    Reaction: made Restless Leg Syndrome worse     Family History  Problem Relation Age of Onset   Hypertension Mother    Diabetes Mother    Heart failure Father    Breast cancer Neg Hx     Prior to Admission medications   Medication Sig Start Date End Date Taking? Authorizing Provider  albuterol (PROVENTIL) (2.5 MG/3ML) 0.083% nebulizer solution Take 2.5 mg by nebulization every 6 (six) hours as  needed for wheezing or shortness of breath.   Yes [provider]  ALPRAZolam (XANAX) 0.25 MG tablet Take 1 tablet (0.25 mg total) by mouth 3 (three) times daily as needed for anxiety. 08/03/16  Yes Adrian Saran, MD  aspirin EC 81 MG tablet Take by mouth daily as needed.   Yes [provider]  azelastine (ASTELIN) 0.1 % nasal spray  09/16/20  Yes [provider]  azithromycin (ZITHROMAX) 250 MG tablet Take 250 mg by mouth once. For 90 days 08/24/22 11/22/22 Yes [provider]  B Complex-C (B-COMPLEX WITH VITAMIN C) tablet Take 1 tablet by mouth daily.    Yes [provider]  calcium carbonate (OSCAL) 1500 (600 Ca) MG TABS tablet Take by mouth. 01/01/05  Yes [provider]  chlorpheniramine-HYDROcodone (TUSSIONEX) 10-8 MG/5ML SMARTSIG:5 Milliliter(s) By Mouth Every 12 Hours PRN 04/27/22  Yes [provider]  COMBIVENT RESPIMAT 20-100 MCG/ACT AERS respimat Inhale 1 puff into the lungs 4 (four) times daily.   Yes [provider]  estradiol (ESTRACE) 0.1 MG/GM vaginal cream Insert 1 g into vagina nightly x 1 week, then continue using 1 g two (2) times a week. My use applicator or fingertip to insert. 08/16/22  Yes [provider]  fluticasone furoate-vilanterol (BREO ELLIPTA) 200-25 MCG/INH AEPB Inhale 1 puff into the lungs daily. 07/21/19  Yes Erin Fulling, MD  furosemide (LASIX) 80 MG tablet Take 80 mg by mouth 2 (two) times daily. 08/08/20 08/26/22 Yes [provider]  ibuprofen (ADVIL) 800 MG tablet Take by mouth.   Yes [provider]  ibuprofen (ADVIL,MOTRIN) 200 MG tablet Take 800 mg by mouth every 6 (six) hours as needed for headache or mild pain.    Yes [provider]  ipratropium-albuterol (DUONEB) 0.5-2.5 (3) MG/3ML SOLN Take 3 mLs by nebulization every 4 (four) hours as needed. 08/03/16  Yes Mody, Patricia Pesa, MD  ketoconazole (NIZORAL) 2 % cream Apply once daily under the breast and left under arm. 04/25/20   Yes Deirdre Evener, MD  levalbuterol Pauline Aus) 0.31 MG/3ML nebulizer solution Inhale into the lungs. 11/18/20 08/26/22 Yes [provider]  levothyroxine (SYNTHROID, LEVOTHROID) 200 MCG tablet Take 250 mcg by mouth daily before breakfast.   Yes [provider]  loratadine-pseudoephedrine (CLARITIN-D 24-HOUR) 10-240 MG 24 hr tablet Take 1 tablet by mouth daily as needed for allergies.   Yes [provider]  MYRBETRIQ 50 MG TB24 tablet Take 50 mg by mouth daily. 07/20/22  Yes [provider]  potassium chloride SA (K-DUR,KLOR-CON) 20 MEQ tablet Take 20 mEq by mouth 2 (two) times daily.   Yes [provider]  spironolactone (ALDACTONE) 25 MG tablet Take 1 tablet by mouth 2 (two) times daily. 10/05/20  Yes [provider]  sulfamethoxazole-trimethoprim (BACTRIM) 400-80 MG tablet Take 1 tablet  by mouth daily. 08/10/22  Yes [provider]  topiramate (TOPAMAX) 100 MG tablet Take 100 mg by mouth daily.   Yes [provider]  traZODone (DESYREL) 50 MG tablet Take 1 tablet by mouth at bedtime as needed. 08/17/22 08/17/23 Yes [provider]  trospium (SANCTURA) 20 MG tablet Take by mouth. 08/14/22 08/14/23 Yes [provider]  VENTOLIN HFA 108 (90 Base) MCG/ACT inhaler Inhale 2 puffs into the lungs every 6 (six) hours as needed. 11/30/21  Yes [provider]  ferrous sulfate 325 (65 FE) MG tablet Take 325 mg by mouth daily with breakfast. Patient not taking: Reported on 08/26/2022    [provider]  losartan (COZAAR) 25 MG tablet Take 25 mg by mouth daily. Patient not taking: Reported on 07/10/2022    [provider]  modafinil (PROVIGIL) 100 MG tablet Take 100 mg by mouth daily.    [provider]  Multiple Vitamins-Minerals (MULTIVITAMIN WOMEN PO) Take 1 tablet by mouth daily.    [provider]  Omega-3 Fatty Acids (OMEGA 3 PO) Take 1 capsule by mouth daily.    [provider]  oxybutynin (DITROPAN) 5 MG tablet Take 1 tablet by mouth 3 (three) times daily. Patient not taking: Reported on 07/10/2022 02/18/20   [provider]  Potassium Chloride ER 20 MEQ TBCR Take by mouth. Patient not taking: Reported on 08/26/2022 08/10/22   [provider]  pramipexole (MIRAPEX) 1 MG tablet Take 2 tablets (2 mg total) by mouth at bedtime. 03/31/22   Susa Griffins, MD  traMADol (ULTRAM) 50 MG tablet Take 1 tablet (50 mg total) by mouth every 8 (eight) hours as needed (for mild pain). Patient not taking: Reported on 08/26/2022 04/11/18   Shuford, French Ana, PA-C  zolpidem (AMBIEN) 10 MG tablet Take 5 mg by mouth at bedtime as needed for sleep.    [provider]    Physical Exam: Vitals:   08/26/22 0639 08/26/22 0641  BP:  123/72  Pulse:  80  Resp:  18  Temp:  97.8 F (36.6 C)  TempSrc:  Oral  SpO2:  92%  Weight: 104.3 kg   Height: 5\' 6"  (1.676 m)    Physical Exam Constitutional:      Appearance: She is obese.  HENT:     Head: Normocephalic and atraumatic.     Nose: Nose normal.     Mouth/Throat:     Mouth: Mucous membranes are moist.  Eyes:     Pupils: Pupils are equal, round, and reactive to light.  Cardiovascular:     Rate and Rhythm: Normal rate and regular rhythm.  Pulmonary:     Effort: Pulmonary effort is normal.  Abdominal:     General: Bowel sounds are normal.  Musculoskeletal:     Cervical back: Normal range of motion.     Right lower leg: Edema present.     Left lower leg: Edema present.  Skin:    Findings: Erythema present.  Neurological:     General: No focal deficit present.  Psychiatric:        Mood and Affect: Mood normal.     Data Reviewed:  There are no new results to review at this time. DG Chest Port 1 View CLINICAL DATA:  Weakness  EXAM: PORTABLE CHEST 1 VIEW  COMPARISON:  Prior chest x-ray 03/29/2022  FINDINGS: Stable cardiomegaly and pulmonary vascular congestion without overt edema. Linear  airspace opacities in the lung bases favored to reflect atelectasis. Lung volumes  are low. No pleural effusion or pneumothorax. Bilateral shoulder arthroplasties and partially imaged cervical stabilization hardware.  IMPRESSION: 1. Cardiomegaly with increased pulmonary vascular congestion but no overt edema. 2. Low lung volumes with bibasilar atelectasis.  Electronically Signed   By: Malachy Moan M.D.   On: 08/26/2022 08:55  Lab Results  Component Value Date   WBC 6.1 08/26/2022   HGB 13.7 08/26/2022   HCT 44.6 08/26/2022   MCV 98.5 08/26/2022   PLT 188 08/26/2022   Last metabolic panel Lab Results  Component Value Date   GLUCOSE 93 08/26/2022   NA 142 08/26/2022   K 3.9 08/26/2022   CL 104 08/26/2022   CO2 30 08/26/2022   BUN 34 (H) 08/26/2022   CREATININE 1.05 (H) 08/26/2022   GFRNONAA 55 (L) 08/26/2022   CALCIUM 9.1 08/26/2022   PHOS 2.7 07/16/2016   PROT 6.8 08/26/2022   ALBUMIN 4.0 08/26/2022   BILITOT 0.2 (L) 08/26/2022   ALKPHOS 103 08/26/2022   AST 24 08/26/2022   ALT 13 08/26/2022   ANIONGAP 8 08/26/2022    Assessment and Plan: Acute on chronic diastolic CHF (congestive heart failure) (HCC) Acute decompensated diastolic heart failure exacerbation with noted significantly worsening lower extremity swelling and weakness over multiple weeks On high-dose diuretic with 80 mg twice daily of Lasix at home Marked pitting edema in lower extremities bilaterally with mild venous stasis changes  Suspect concomitant high-dose regular NSAID use is likely trigger BNP 190 Chest x-ray with cardiomegaly and vascular congestion Status post 80 mg of IV Lasix in ER with good urine output thus far Will formally consult Dr. Darrold Junker for recommendations  Stressed NSAID avoidance Follow   Swelling of lower extremity Marked bilateral lower extremity swelling in the setting of baseline diastolic heart failure with high NSAID use Suspect dependent edema with associated  volume overload Positive bilateral lower extremity redness Do not suspect component of infection or cellulitis at present as patient is afebrile, without white count-there may be an element of possible venous stasis. Diurese as appropriate Check lower extremity ultrasound to rule out DVT Place TED hose Otherwise continue to monitor  Hypothyroidism Cont synthroid   COPD (chronic obstructive pulmonary disease) (HCC) Stable from a resp standpoint  Cont home inhalers    HTN (hypertension) BP stable  Titrate home regimen        Advance Care Planning:   Code Status: Full Code   Consults: Dr. Darrold Junker   Family Communication: Husband at the bedside   Severity of Illness: The appropriate patient status for this patient is OBSERVATION. Observation status is judged to be reasonable and necessary in order to provide the required intensity of service to ensure the patient's safety. The patient's presenting symptoms, physical exam findings, and initial radiographic and laboratory data in the context of their medical condition is felt to place them at decreased risk for further clinical deterioration. Furthermore, it is anticipated that the patient will be medically stable for discharge from the hospital within 2 midnights of admission.    Greater than 50% was spent in counseling and coordination of care with patient Total encounter time 80 minutes or more  Author: Floydene Flock, MD 08/26/2022 9:57 AM  For on call review [email or web address].

## 2022-08-26 NOTE — ED Notes (Signed)
See triage note  Presents with bilateral leg pain and swelling    Some redness noted  Also having some SOB and feels like she is having palpations

## 2022-08-26 NOTE — Consult Note (Signed)
[facility] CARDIOLOGY CONSULT NOTE       Patient ID: Hannah Powell MRN: [id number] DOB/AGE: [birth year] 76 y.o.  Admit date: 08/26/2022 Referring Physician Dr. Doree Albee Primary Physician Dr. Daniel Nones Primary Cardiologist Dr. Gwen Pounds Reason for Consultation acute on chronic diastolic heart failure  HPI: Hannah Powell is a 77 y.o. female  with a past medical history of chronic diastolic heart failure, COPD, OSA on BiPAP, chronic lower extremity edema, hypertension, hypothyroidism who presented to the ED on 08/26/2022 for worsening lower extremity weakness and edema.  Cardiology was consulted for further assistance.   This patient is a prior patient of Dr. Gwen Pounds who was last seen in our office in July 2023.  Patient reports that last week she went to visit her niece and during this visit she noted significant leg weakness and difficulty standing after using the bathroom.  She states that it took her a few days to recover her strength from this episode.  Her strength had improved some but she still was not back to her baseline.  Then last night she noted that her legs were weak again and she was hardly able to stand.  This persisted through the morning and she presented to the ED for evaluation.  At the time of my evaluation she endorses lower extremity edema and weakness in her legs.  This has been worsening for roughly 1-2 weeks.  She takes lasix 80 mg po bid at home and denies any missed doses. She denies any chest pain, shortness of breath, palpitations.  Feels that her breathing is at baseline, currently saturating well on room air.  She did receive IV Lasix 80 mg around 8 AM, states that she does not feel much different at this time.  She has diuresed 1.2 L so far.   Review of systems complete and found to be negative unless listed above     Past Medical History:  Diagnosis Date   Anxiety    Arthritis    CHF (congestive heart failure) (HCC)    COPD (chronic  obstructive pulmonary disease) (HCC)    Dysplastic nevus 04/22/2019   R abdomen parallel to sup umbilicus - mod   Dysplastic nevus 04/22/2019   R low back above waistline - mild    Edema    feet/legs   Hypertension    dr Darryll Capers      Hypothyroidism    Leg weakness, bilateral    Osteoporosis    Oxygen deficit    2l with bipap at night   RLS (restless legs syndrome)    Shortness of breath    Sleep apnea    bipap   Wheezing     Past Surgical History:  Procedure Laterality Date   BACK SURGERY     cervical   BREAST BIOPSY Left    needle bx-neg   CATARACT EXTRACTION W/PHACO Left 05/07/2017   Procedure: CATARACT EXTRACTION PHACO AND INTRAOCULAR LENS PLACEMENT (IOC);  Surgeon: Galen Manila, MD;  Location: [facility] ORS;  Service: Ophthalmology;  Laterality: Left;  [place] 00:48.7 AP% 11.3 CDE 5.46 Fluid Pack Lot # [id number]   CATARACT EXTRACTION W/PHACO Right 04/23/2017   Procedure: CATARACT EXTRACTION PHACO AND INTRAOCULAR LENS PLACEMENT (IOC);  Surgeon: Galen Manila, MD;  Location: [facility] ORS;  Service: Ophthalmology;  Laterality: Right;  [place] 00:29 AP% 12.8 CDE 3.79 FLUID PACK LOT # [id number]   FOOT ARTHROPLASTY     JOINT REPLACEMENT     x2 tkr   TOTAL SHOULDER ARTHROPLASTY Left 07/02/2013  Procedure: LEFT TOTAL SHOULDER ARTHROPLASTY;  Surgeon: Senaida Lange, MD;  Location: [facility] OR;  Service: Orthopedics;  Laterality: Left;   TOTAL SHOULDER ARTHROPLASTY Right 04/10/2018   Procedure: TOTAL REVERSE SHOULDER ARTHROPLASTY;  Surgeon: Francena Hanly, MD;  Location: [facility] ORS;  Service: Orthopedics;  Laterality: Right;     (Not in a hospital admission)  Social History   Socioeconomic History   Marital status: Married    Spouse name: Not on file   Number of children: Not on file   Years of education: Not on file   Highest education level: Not on file  Occupational History   Not on file  Tobacco Use   Smoking status: Former    Packs/day: 1.00    Years: 10.00    Additional pack  years: 0.00    Total pack years: 10.00    Types: Cigarettes    Quit date: 03/20/1975    Years since quitting: 47.4   Smokeless tobacco: Never  Vaping Use   Vaping Use: Never used  Substance and Sexual Activity   Alcohol use: No   Drug use: No   Sexual activity: Not on file  Other Topics Concern   Not on file  Social History Narrative   Not on file   Social Determinants of Health   Financial Resource Strain: Not on file  Food Insecurity: No Food Insecurity (03/29/2022)   Hunger Vital Sign    Worried About Running Out of Food in the Last Year: Never true    Ran Out of Food in the Last Year: Never true  Transportation Needs: No Transportation Needs (03/29/2022)   PRAPARE - Administrator, Civil Service (Medical): No    Lack of Transportation (Non-Medical): No  Physical Activity: Not on file  Stress: Not on file  Social Connections: Not on file  Intimate Partner Violence: Not At Risk (03/29/2022)   Humiliation, Afraid, Rape, and Kick questionnaire    Fear of Current or Ex-Partner: No    Emotionally Abused: No    Physically Abused: No    Sexually Abused: No    Family History  Problem Relation Age of Onset   Hypertension Mother    Diabetes Mother    Heart failure Father    Breast cancer Neg Hx      Vitals:   08/26/22 0639 08/26/22 0641 08/26/22 1044  BP:  123/72 128/70  Pulse:  80 78  Resp:  18 18  Temp:  97.8 F (36.6 C) 98 F (36.7 C)  TempSrc:  Oral   SpO2:  92% 93%  Weight: 104.3 kg    Height: 5\' 6"  (1.676 m)      PHYSICAL EXAM General: Elderly female, well nourished, in no acute distress laying at an incline in the ED stretcher. HEENT: Normocephalic and atraumatic. Neck: No JVD.  Lungs: Normal respiratory effort on room air. No wheezes, rhonchi. Very mild crackles to right lung base. Heart: HRRR. Normal S1 and S2 without gallops or murmurs.  Abdomen: Non-distended appearing.  Msk: Normal strength and tone for age. Extremities: Warm and well  perfused. No clubbing, cyanosis.  Chronic tense edema and erythema to bilateral lower extremities.  Neuro: Alert and oriented X 3. Psych: Answers questions appropriately.   Labs: Basic Metabolic Panel: Recent Labs    08/26/22 0647  NA 142  K 3.9  CL 104  CO2 30  GLUCOSE 93  BUN 34*  CREATININE 1.05*  CALCIUM 9.1   Liver Function Tests: Recent Labs  08/26/22 0647  AST 24  ALT 13  ALKPHOS 103  BILITOT 0.2*  PROT 6.8  ALBUMIN 4.0   No results for input(s): "LIPASE", "AMYLASE" in the last 72 hours. CBC: Recent Labs    08/26/22 0647  WBC 6.1  NEUTROABS 4.2  HGB 13.7  HCT 44.6  MCV 98.5  PLT 188   Cardiac Enzymes: No results for input(s): "CKTOTAL", "CKMB", "CKMBINDEX", "TROPONINIHS" in the last 72 hours. BNP: Recent Labs    08/26/22 0647  BNP 189.2*   D-Dimer: No results for input(s): "DDIMER" in the last 72 hours. Hemoglobin A1C: No results for input(s): "HGBA1C" in the last 72 hours. Fasting Lipid Panel: No results for input(s): "CHOL", "HDL", "LDLCALC", "TRIG", "CHOLHDL", "LDLDIRECT" in the last 72 hours. Thyroid Function Tests: No results for input(s): "TSH", "T4TOTAL", "T3FREE", "THYROIDAB" in the last 72 hours.  Invalid input(s): "FREET3" Anemia Panel: No results for input(s): "VITAMINB12", "FOLATE", "FERRITIN", "TIBC", "IRON", "RETICCTPCT" in the last 72 hours.   Radiology: DG Lumbar Spine 2-3 Views  Result Date: 08/26/2022 CLINICAL DATA:  Lower extremity weakness. Chronic diastolic heart failure. Progressive pain and difficulty ambulating. EXAM: LUMBAR SPINE - 2-3 VIEW COMPARISON:  Lumbar spine MRI from 06/26/2020 FINDINGS: Normal alignment. The vertebral body heights are well preserved. No signs of acute fracture or subluxation. Moderate multilevel disc space narrowing and anterior and posterior spur formation. Facet arthropathy identified within the lower lumbar spine. Aortic atherosclerosis. Moderate stool burden noted within the colon.  IMPRESSION: 1. No acute findings. 2. Moderate multilevel degenerative disc disease and facet arthropathy. Electronically Signed   By: Signa Kell M.D.   On: 08/26/2022 10:37   DG Chest Port 1 View  Result Date: 08/26/2022 CLINICAL DATA:  Weakness EXAM: PORTABLE CHEST 1 VIEW COMPARISON:  Prior chest x-ray 03/29/2022 FINDINGS: Stable cardiomegaly and pulmonary vascular congestion without overt edema. Linear airspace opacities in the lung bases favored to reflect atelectasis. Lung volumes are low. No pleural effusion or pneumothorax. Bilateral shoulder arthroplasties and partially imaged cervical stabilization hardware. IMPRESSION: 1. Cardiomegaly with increased pulmonary vascular congestion but no overt edema. 2. Low lung volumes with bibasilar atelectasis. Electronically Signed   By: Malachy Moan M.D.   On: 08/26/2022 08:55    ECHO 09/13/2021:  ECHOCARDIOGRAPHIC MEASUREMENTS  2D DIMENSIONS  AORTA                  Values   Normal Range   MAIN PA         Values    Normal Range                Annulus: 1.9 cm       [2.1-2.5]         PA Main: nm*       [1.5-2.1]              Aorta Sin: 3.1 cm       [2.7-3.3]    RIGHT VENTRICLE            ST Junction: nm*          [2.3-2.9]         RV Base: nm*       [<4.2]             Asc.Aorta: 3.3 cm       [2.3-3.1]          RV Mid: 2.9 cm    [<3.5]  LEFT VENTRICLE  RV Length: nm*       [<8.6]                 LVIDd: 4.3 cm       [3.9-5.3]    INFERIOR VENA CAVA                  LVIDs: 3.1 cm                        Max. IVC: nm*       [<=2.1]                    FS: 28.3 %       [>25]            Min. IVC: nm*                    SWT: 1.1 cm       [0.5-0.9]    ------------------                    PWT: 1.1 cm       [0.5-0.9]    nm* - not measured  LEFT ATRIUM                LA Diam: 4.0 cm       [2.7-3.8]            LA A4C Area: nm*          [<20]             LA Volume: nm*          [22-52]   _________________________________________________________________________________________  ECHOCARDIOGRAPHIC DESCRIPTIONS  AORTIC ROOT                   Size: Normal             Dissection: INDETERM FOR DISSECTION  AORTIC VALVE               Leaflets: Tricuspid                   Morphology: Normal               Mobility: Fully mobile  LEFT VENTRICLE                   Size: Normal                        Anterior: Normal            Contraction: Normal                         Lateral: Normal             Closest EF: >55% (Estimated)                Septal: Normal              LV Masses: No Masses                       Apical: Normal                    LVH: None                          Inferior: Normal  Posterior: Normal           Dias.FxClass: (Grade 1) relaxation abnormal, E/A reversal  MITRAL VALVE               Leaflets: Normal                        Mobility: Fully mobile             Morphology: ANNULAR CALC  LEFT ATRIUM                   Size: Normal                       LA Masses: No masses              IA Septum: Normal IAS  MAIN PA                   Size: Normal  PULMONIC VALVE             Morphology: Normal                        Mobility: Fully mobile  RIGHT VENTRICLE              RV Masses: No Masses                         Size: Normal              Free Wall: Normal                     Contraction: Normal  TRICUSPID VALVE               Leaflets: Normal                        Mobility: Fully mobile             Morphology: Normal  RIGHT ATRIUM                   Size: Normal                        RA Other: None                RA Mass: No masses  PERICARDIUM                 Fluid: No effusion  INFERIOR VENACAVA                   Size: Normal Normal respiratory collapse  _________________________________________________________________________________________   DOPPLER ECHO and OTHER SPECIAL PROCEDURES                  Aortic: No AR                      No AS                         143.0 cm/sec peak vel      8.2 mmHg peak grad                 Mitral: TRIVIAL MR                 No MS  MV Inflow E Vel = 123.0 cm/sec      MV Annulus E'Vel = 7.6 cm/sec                         E/E'Ratio = 16.2              Tricuspid: TRIVIAL TR                 No TS              Pulmonary: MILD PR                    No PS                         128.0 cm/sec peak vel      6.6 mmHg peak grad  _________________________________________________________________________________________  INTERPRETATION  NORMAL LEFT VENTRICULAR SYSTOLIC FUNCTION  NORMAL RIGHT VENTRICULAR SYSTOLIC FUNCTION  NO VALVULAR STENOSIS  TRIVIAL MR, TR  MILD PR  EF >55%   TELEMETRY reviewed by me [facility]) 08/26/2022 : none for review  EKG reviewed by me: sinus rhythm PACs PVC, reviewed by myself and Dr. Darrold Junker  Data reviewed by me [facility]) 08/26/2022: last 24h vitals tele labs imaging I/O, admission H&P, ED provider note, nursing notes  Principal Problem:   CHF exacerbation (HCC) Active Problems:   Acute on chronic diastolic CHF (congestive heart failure) (HCC)   HTN (hypertension)   COPD (chronic obstructive pulmonary disease) (HCC)   Hypothyroidism   Swelling of lower extremity    ASSESSMENT AND PLAN:  Quasha Speidel is a 77 y.o. female  with a past medical history of chronic diastolic heart failure, COPD, OSA on BiPAP, chronic lower extremity edema, hypertension, hypothyroidism who presented to the ED on 08/26/2022 for worsening lower extremity weakness and edema.  # acute on chronic diastolic heart failure # lower extremity edema # leg weakness Patient with worsening of lower extremity weakness and edema over the last week.  She is without shortness of breath, CXR with some increased pulmonary vascular congestion -S/p IV lasix 80 mg x1 in ED this AM -Plan to give IV Lasix 80 mg again tomorrow morning  -1.2 L output  already, continue to monitor I/Os -Currently not requiring any supplemental oxygen -LE [place] to rule out DVT  -Recommend PT/OT to help with strength -Echo pending  # Hypertension BP controlled this AM, monitor closely with diuresis -Continue home losartan and spironolactone.  -Monitor renal function closely -Monitor and replenish electrolytes for a goal K >4, Mag >2   # COPD # OSA on BiPAP Appears stable with no worsening shortness of breath, cough, dyspnea. -Continue home inhalers -Continue BiPAP nightly for OSA   This patient's plan of care was discussed and created with Dr. Darrold Junker and he is in agreement.  Signed: Cheryl Flash, PA-C 08/26/2022, 11:00 AM [facility] Cardiology

## 2022-08-26 NOTE — Assessment & Plan Note (Signed)
Stable from a resp standpoint  Cont home inhalers   

## 2022-08-26 NOTE — ED Notes (Signed)
Report given to [facility] RN by [employer]

## 2022-08-26 NOTE — Assessment & Plan Note (Signed)
Cont synthroid 

## 2022-08-26 NOTE — Assessment & Plan Note (Signed)
Marked bilateral lower extremity swelling in the setting of baseline diastolic heart failure with high NSAID use Suspect dependent edema with associated volume overload Positive bilateral lower extremity redness Do not suspect component of infection or cellulitis at present as patient is afebrile, without white count-there may be an element of possible venous stasis. Diurese as appropriate Check lower extremity ultrasound to rule out DVT Place TED hose Otherwise continue to monitor

## 2022-08-26 NOTE — ED Notes (Signed)
Pt placed on 2L [place] due to pt's O2 saturation dropping to 87% while sleeping. MD notified.

## 2022-08-26 NOTE — ED Notes (Signed)
Admitting MD in with pt  Family at bedside

## 2022-08-26 NOTE — ED Triage Notes (Signed)
Pt presents via [facility] for bilateral leg pain x 1 month. Per patient, recently treated for for cellulitis with no improvement in her sx. Pt endorses pain with palpation and has some redness to the anterior surface of her legs. A&Ox4 at this time. Denies CP or SOB.    VS 137/852

## 2022-08-27 ENCOUNTER — Observation Stay
Admit: 2022-08-27 | Discharge: 2022-08-27 | Disposition: A | Discharge status: Home / Self Care | Payer: Medicare HMO | Attending: Family Medicine | Admitting: Family Medicine

## 2022-08-27 DIAGNOSIS — M1812 Unilateral primary osteoarthritis of first carpometacarpal joint, left hand: Secondary | ICD-10-CM | POA: Diagnosis not present

## 2022-08-27 DIAGNOSIS — Z888 Allergy status to other drugs, medicaments and biological substances status: Secondary | ICD-10-CM | POA: Diagnosis not present

## 2022-08-27 DIAGNOSIS — I110 Hypertensive heart disease with heart failure: Secondary | ICD-10-CM | POA: Diagnosis present

## 2022-08-27 DIAGNOSIS — M79604 Pain in right leg: Secondary | ICD-10-CM | POA: Diagnosis not present

## 2022-08-27 DIAGNOSIS — E876 Hypokalemia: Secondary | ICD-10-CM | POA: Diagnosis present

## 2022-08-27 DIAGNOSIS — Z7982 Long term (current) use of aspirin: Secondary | ICD-10-CM | POA: Diagnosis not present

## 2022-08-27 DIAGNOSIS — G4730 Sleep apnea, unspecified: Secondary | ICD-10-CM | POA: Diagnosis present

## 2022-08-27 DIAGNOSIS — Z87891 Personal history of nicotine dependence: Secondary | ICD-10-CM | POA: Diagnosis not present

## 2022-08-27 DIAGNOSIS — I10 Essential (primary) hypertension: Secondary | ICD-10-CM | POA: Diagnosis not present

## 2022-08-27 DIAGNOSIS — M7989 Other specified soft tissue disorders: Secondary | ICD-10-CM | POA: Diagnosis not present

## 2022-08-27 DIAGNOSIS — E039 Hypothyroidism, unspecified: Secondary | ICD-10-CM

## 2022-08-27 DIAGNOSIS — R600 Localized edema: Secondary | ICD-10-CM | POA: Diagnosis present

## 2022-08-27 DIAGNOSIS — Z79818 Long term (current) use of other agents affecting estrogen receptors and estrogen levels: Secondary | ICD-10-CM | POA: Diagnosis not present

## 2022-08-27 DIAGNOSIS — G4733 Obstructive sleep apnea (adult) (pediatric): Secondary | ICD-10-CM | POA: Diagnosis present

## 2022-08-27 DIAGNOSIS — I878 Other specified disorders of veins: Secondary | ICD-10-CM | POA: Diagnosis present

## 2022-08-27 DIAGNOSIS — Z8249 Family history of ischemic heart disease and other diseases of the circulatory system: Secondary | ICD-10-CM | POA: Diagnosis not present

## 2022-08-27 DIAGNOSIS — Z96653 Presence of artificial knee joint, bilateral: Secondary | ICD-10-CM | POA: Diagnosis present

## 2022-08-27 DIAGNOSIS — J449 Chronic obstructive pulmonary disease, unspecified: Secondary | ICD-10-CM | POA: Diagnosis present

## 2022-08-27 DIAGNOSIS — I952 Hypotension due to drugs: Secondary | ICD-10-CM | POA: Diagnosis present

## 2022-08-27 DIAGNOSIS — I5033 Acute on chronic diastolic (congestive) heart failure: Secondary | ICD-10-CM | POA: Diagnosis present

## 2022-08-27 DIAGNOSIS — Z96612 Presence of left artificial shoulder joint: Secondary | ICD-10-CM | POA: Diagnosis present

## 2022-08-27 DIAGNOSIS — M79605 Pain in left leg: Secondary | ICD-10-CM | POA: Diagnosis not present

## 2022-08-27 DIAGNOSIS — Z96611 Presence of right artificial shoulder joint: Secondary | ICD-10-CM | POA: Diagnosis present

## 2022-08-27 DIAGNOSIS — Z981 Arthrodesis status: Secondary | ICD-10-CM | POA: Diagnosis not present

## 2022-08-27 DIAGNOSIS — M810 Age-related osteoporosis without current pathological fracture: Secondary | ICD-10-CM | POA: Diagnosis present

## 2022-08-27 DIAGNOSIS — Z79890 Hormone replacement therapy (postmenopausal): Secondary | ICD-10-CM | POA: Diagnosis not present

## 2022-08-27 DIAGNOSIS — Z833 Family history of diabetes mellitus: Secondary | ICD-10-CM | POA: Diagnosis not present

## 2022-08-27 DIAGNOSIS — Z79899 Other long term (current) drug therapy: Secondary | ICD-10-CM | POA: Diagnosis not present

## 2022-08-27 DIAGNOSIS — M19032 Primary osteoarthritis, left wrist: Secondary | ICD-10-CM | POA: Diagnosis present

## 2022-08-27 DIAGNOSIS — M5412 Radiculopathy, cervical region: Secondary | ICD-10-CM | POA: Diagnosis not present

## 2022-08-27 DIAGNOSIS — G2581 Restless legs syndrome: Secondary | ICD-10-CM | POA: Diagnosis present

## 2022-08-27 LAB — COMPREHENSIVE METABOLIC PANEL
ALT: 9 U/L (ref 0–44)
AST: 20 U/L (ref 15–41)
Albumin: 3.2 g/dL — ABNORMAL LOW (ref 3.5–5.0)
Alkaline Phosphatase: 83 U/L (ref 38–126)
Anion gap: 8 (ref 5–15)
BUN: 29 mg/dL — ABNORMAL HIGH (ref 8–23)
CO2: 29 mmol/L (ref 22–32)
Calcium: 8.3 mg/dL — ABNORMAL LOW (ref 8.9–10.3)
Chloride: 103 mmol/L (ref 98–111)
Creatinine, Ser: 1 mg/dL (ref 0.44–1.00)
GFR, Estimated: 59 mL/min — ABNORMAL LOW (ref >60)
Glucose, Bld: 103 mg/dL — ABNORMAL HIGH (ref 70–99)
Potassium: 3.6 mmol/L (ref 3.5–5.1)
Sodium: 140 mmol/L (ref 135–145)
Total Bilirubin: 0.5 mg/dL (ref 0.3–1.2)
Total Protein: 5.3 g/dL — ABNORMAL LOW (ref 6.5–8.1)

## 2022-08-27 LAB — TSH: TSH: 7.082 u[IU]/mL — ABNORMAL HIGH (ref 0.350–4.500)

## 2022-08-27 LAB — CBC
HCT: 40.2 % (ref 36.0–46.0)
Hemoglobin: 12.2 g/dL (ref 12.0–15.0)
MCH: 30.1 pg (ref 26.0–34.0)
MCHC: 30.3 g/dL (ref 30.0–36.0)
MCV: 99.3 fL (ref 80.0–100.0)
Platelets: 183 10*3/uL (ref 150–400)
RBC: 4.05 MIL/uL (ref 3.87–5.11)
RDW: 13.5 % (ref 11.5–15.5)
WBC: 5.5 10*3/uL (ref 4.0–10.5)
nRBC: 0 % (ref 0.0–0.2)

## 2022-08-27 LAB — ECHOCARDIOGRAM COMPLETE: Weight: 3679.04 oz

## 2022-08-27 LAB — T4, FREE: Free T4: 0.78 ng/dL (ref 0.61–1.12)

## 2022-08-27 LAB — MAGNESIUM: Magnesium: 2.5 mg/dL — ABNORMAL HIGH (ref 1.7–2.4)

## 2022-08-27 MED ORDER — SPIRONOLACTONE 12.5 MG HALF TABLET
12.5000 mg | ORAL_TABLET | Freq: Every day | ORAL | Status: DC
  Administered 2022-08-27 – 2022-08-29 (×3): 12.5 mg via ORAL
  Filled 2022-08-27 (×3): qty 1

## 2022-08-27 MED ORDER — SODIUM CHLORIDE 0.9 % IV BOLUS
250.0000 mL | Freq: Once | INTRAVENOUS | Status: AC
  Administered 2022-08-27: 250 mL via INTRAVENOUS

## 2022-08-27 MED ORDER — POTASSIUM CHLORIDE CRYS ER 20 MEQ PO TBCR
40.0000 meq | EXTENDED_RELEASE_TABLET | Freq: Two times a day (BID) | ORAL | Status: AC
  Administered 2022-08-27 (×2): 40 meq via ORAL
  Filled 2022-08-27 (×2): qty 2

## 2022-08-27 MED ORDER — SODIUM CHLORIDE 0.9 % IV BOLUS
250.0000 mL | Freq: Once | INTRAVENOUS | Status: AC
  Administered 2022-08-27: 150 mL via INTRAVENOUS

## 2022-08-27 MED ORDER — FUROSEMIDE 10 MG/ML IJ SOLN
60.0000 mg | Freq: Two times a day (BID) | INTRAMUSCULAR | Status: DC
  Administered 2022-08-27 – 2022-08-28 (×3): 60 mg via INTRAVENOUS
  Filled 2022-08-27: qty 6
  Filled 2022-08-27 (×2): qty 8

## 2022-08-27 NOTE — Hospital Course (Addendum)
Taken from H&P and prior notes.   Hannah Powell is a 77 y.o. female with medical history significant of morbid obesity, COPD, diastolic CHF, OSA on BiPAP presenting with acute diastolic CHF exacerbation, lower extremity weakness.  Patient reports worsening lower extremity weakness and swelling over the past 2 weeks or so.  Minimal orthopnea and PND.  No chest pain.   Takes 80 mg p.o. Lasix twice a day. No missed doses. Patient does admit to eating some salty food. Also reports regular ibuprofen use for generalized pain.   ED course and data reviewed.  Hemodynamically stable, stable labs, BNP 190.  Chest x-ray with cardiomegaly and increased pulmonary vascular congestion.  Patient received 80 mg of IV Lasix in the ED. [facility] clinic cardiology was consulted as she was an established patient with them.  Repeat echocardiogram ordered.  6/10:Overnight patient became more hypotensive and mildly hypoxic requiring 2 L of oxygen.  Received 250 cc bolus x 2.  Lower extremity venous Doppler was negative for DVT.  Renal function stable with some improvement in BUN. Echocardiogram-done with pending results PT/OT evaluation-recommending home health. Cardiology would like to continue IV diuresis for another day.  6/11: Blood pressure borderline soft, started sharpshooting pain starting from neck going down on left upper extremity with tingling and numbness involving ring and little finger.  Has a remote history of cervical fusion.  MRI of cervical spine and wrist ordered.  6/12: Vital stable with borderline soft blood pressure.  BMP with mild hypokalemia and creatinine started trending up at 1.07, IV Lasix has been switched to p.o. from tomorrow. MRI cervical spine with chronic changes and fusion and there was some concern of compression at C3-discussed with neurosurgery and according to them it should not cause pain in the upper extremity and they will see her as outpatient for further management. MRI of  left wrist with severe osteoarthritis and tenosynovitis might be causing this severe pain. Patient has elevated TSH with normal free T4, likely needs some increase in her Synthroid which can be done by PCP.  Cardiology switched her home Lasix with torsemide.  Decrease the dose of spironolactone. She will continue on current medications and need to have a close follow-up with cardiology for further management.  Patient was found to be on Bactrim and Zithromax for longer durations, not sure why.  We did not continue during current hospitalization.  Those medications were continued on discharge and patient need to discuss with her provider who prescribed it about the importance of continuing that.  Patient will follow-up with neurosurgery and orthopedic surgery for cervical radiculopathy and concern of wrist severe arthritis.

## 2022-08-27 NOTE — Progress Notes (Signed)
[facility] CLINIC CARDIOLOGY CONSULT NOTE       Patient ID: Hannah Powell MRN: [id number] DOB/AGE: 07-11-1945 77 y.o.  Admit date: 08/26/2022 Referring Physician Dr. Doree Albee Primary Physician Dr. Daniel Nones Primary Cardiologist Dr. Gwen Pounds Reason for Consultation acute on chronic diastolic heart failure  HPI: Hannah Powell is a 77 y.o. female  with a past medical history of chronic HFpEF (>55%, g1DD 08/2021), COPD, OSA on BiPAP, chronic lower extremity edema, hypertension, hypothyroidism who presented to [facility] ED on 08/26/2022 for worsening lower extremity weakness and edema.  Cardiology was consulted for further assistance.   Interval History:  - feels "about the same"  - no chest pain or dyspnea. Peripheral edema still present without real change overnight per patient. Eager to work with PT and get OOB.  - echo performed this AM, pending read -Net IO Since Admission: -2,700 mL [08/27/22 1054]   At baseline, reportedly goes to gym and uses seated elliptical x 3 times weekly   Review of systems complete and found to be negative unless listed above    Past Medical History:  Diagnosis Date   Anxiety    Arthritis    CHF (congestive heart failure) (HCC)    COPD (chronic obstructive pulmonary disease) (HCC)    Dysplastic nevus 04/22/2019   R abdomen parallel to sup umbilicus - mod   Dysplastic nevus 04/22/2019   R low back above waistline - mild    Edema    feet/legs   Hypertension    dr Darryll Capers      Hypothyroidism    Leg weakness, bilateral    Osteoporosis    Oxygen deficit    2l with bipap at night   RLS (restless legs syndrome)    Shortness of breath    Sleep apnea    bipap   Wheezing     Past Surgical History:  Procedure Laterality Date   BACK SURGERY     cervical   BREAST BIOPSY Left    needle bx-neg   CATARACT EXTRACTION W/PHACO Left 05/07/2017   Procedure: CATARACT EXTRACTION PHACO AND INTRAOCULAR LENS PLACEMENT (IOC);  Surgeon: Galen Manila, MD;  Location: [facility] ORS;  Service: Ophthalmology;  Laterality: Left;  [place] 00:48.7 AP% 11.3 CDE 5.46 Fluid Pack Lot # [id number]   CATARACT EXTRACTION W/PHACO Right 04/23/2017   Procedure: CATARACT EXTRACTION PHACO AND INTRAOCULAR LENS PLACEMENT (IOC);  Surgeon: Galen Manila, MD;  Location: [facility] ORS;  Service: Ophthalmology;  Laterality: Right;  [place] 00:29 AP% 12.8 CDE 3.79 FLUID PACK LOT # [id number]   FOOT ARTHROPLASTY     JOINT REPLACEMENT     x2 tkr   TOTAL SHOULDER ARTHROPLASTY Left 07/02/2013   Procedure: LEFT TOTAL SHOULDER ARTHROPLASTY;  Surgeon: Senaida Lange, MD;  Location: [facility] OR;  Service: Orthopedics;  Laterality: Left;   TOTAL SHOULDER ARTHROPLASTY Right 04/10/2018   Procedure: TOTAL REVERSE SHOULDER ARTHROPLASTY;  Surgeon: Francena Hanly, MD;  Location: [facility] ORS;  Service: Orthopedics;  Laterality: Right;     (Not in a hospital admission)  Social History   Socioeconomic History   Marital status: Married    Spouse name: Not on file   Number of children: Not on file   Years of education: Not on file   Highest education level: Not on file  Occupational History   Not on file  Tobacco Use   Smoking status: Former    Packs/day: 1.00    Years: 10.00    Additional pack years: 0.00  Total pack years: 10.00    Types: Cigarettes    Quit date: 03/20/1975    Years since quitting: 47.4   Smokeless tobacco: Never  Vaping Use   Vaping Use: Never used  Substance and Sexual Activity   Alcohol use: No   Drug use: No   Sexual activity: Not on file  Other Topics Concern   Not on file  Social History Narrative   Not on file   Social Determinants of Health   Financial Resource Strain: Not on file  Food Insecurity: No Food Insecurity (03/29/2022)   Hunger Vital Sign    Worried About Running Out of Food in the Last Year: Never true    Ran Out of Food in the Last Year: Never true  Transportation Needs: No Transportation Needs (03/29/2022)   PRAPARE - Therapist, art (Medical): No    Lack of Transportation (Non-Medical): No  Physical Activity: Not on file  Stress: Not on file  Social Connections: Not on file  Intimate Partner Violence: Not At Risk (03/29/2022)   Humiliation, Afraid, Rape, and Kick questionnaire    Fear of Current or Ex-Partner: No    Emotionally Abused: No    Physically Abused: No    Sexually Abused: No    Family History  Problem Relation Age of Onset   Hypertension Mother    Diabetes Mother    Heart failure Father    Breast cancer Neg Hx      Vitals:   08/27/22 0630 08/27/22 0645 08/27/22 0700 08/27/22 0746  BP: 113/80 (!) 98/49 (!) 105/58 98/72  Pulse: 69 76 73 70  Resp: 16 15 (!) 23 16  Temp:    98.3 F (36.8 C)  TempSrc:    Oral  SpO2: 96% 100% 98% 100%  Weight:      Height:        PHYSICAL EXAM General: Elderly female, well nourished, in no acute distress laying at an incline in the ED stretcher. HEENT: Normocephalic and atraumatic. Neck: No JVD.  Lungs: Normal respiratory effort on O2 by Kite. No wheezes, rhonchi. Decreased breath sounds in bases, no appreciable crackles Heart: HRRR. Normal S1 and S2 without gallops or murmurs.  Abdomen: Non-distended appearing.  Msk: Normal strength and tone for age. Extremities: Warm and well perfused. No clubbing, cyanosis.  Chronic tense edema and erythema to bilateral lower extremities.  Neuro: Alert and oriented X 3. Psych: Answers questions appropriately.   Labs: Basic Metabolic Panel: Recent Labs    08/26/22 0647 08/27/22 0115  NA 142 140  K 3.9 3.6  CL 104 103  CO2 30 29  GLUCOSE 93 103*  BUN 34* 29*  CREATININE 1.05* 1.00  CALCIUM 9.1 8.3*  MG  --  2.5*    Liver Function Tests: Recent Labs    08/26/22 0647 08/27/22 0115  AST 24 20  ALT 13 9  ALKPHOS 103 83  BILITOT 0.2* 0.5  PROT 6.8 5.3*  ALBUMIN 4.0 3.2*    No results for input(s): "LIPASE", "AMYLASE" in the last 72 hours. CBC: Recent Labs    08/26/22 0647  08/27/22 0115  WBC 6.1 5.5  NEUTROABS 4.2  --   HGB 13.7 12.2  HCT 44.6 40.2  MCV 98.5 99.3  PLT 188 183    Cardiac Enzymes: No results for input(s): "CKTOTAL", "CKMB", "CKMBINDEX", "TROPONINIHS" in the last 72 hours. BNP: Recent Labs    08/26/22 0647  BNP 189.2*    D-Dimer: No results  for input(s): "DDIMER" in the last 72 hours. Hemoglobin A1C: No results for input(s): "HGBA1C" in the last 72 hours. Fasting Lipid Panel: No results for input(s): "CHOL", "HDL", "LDLCALC", "TRIG", "CHOLHDL", "LDLDIRECT" in the last 72 hours. Thyroid Function Tests: No results for input(s): "TSH", "T4TOTAL", "T3FREE", "THYROIDAB" in the last 72 hours.  Invalid input(s): "FREET3" Anemia Panel: No results for input(s): "VITAMINB12", "FOLATE", "FERRITIN", "TIBC", "IRON", "RETICCTPCT" in the last 72 hours.   Radiology: US Venous Img Lower Bilateral (DVT)  Result Date: 08/26/2022 CLINICAL DATA:  Lower extremity swelling EXAM: BILATERAL LOWER EXTREMITY VENOUS DOPPLER ULTRASOUND TECHNIQUE: Gray-scale sonography with compression, as well as color and duplex ultrasound, were performed to evaluate the deep venous system(s) from the level of the common femoral vein through the popliteal and proximal calf veins. COMPARISON:  None Available. FINDINGS: VENOUS Normal compressibility of the common femoral, superficial femoral, and popliteal veins, as well as the visualized calf veins. Visualized portions of profunda femoral vein and great saphenous vein unremarkable. No filling defects to suggest DVT on grayscale or color Doppler imaging. Doppler waveforms show normal direction of venous flow, normal respiratory plasticity and response to augmentation. Limited views of the contralateral common femoral vein are unremarkable. OTHER None. Limitations: none IMPRESSION: Negative examination for deep venous thrombosis in the bilateral lower extremities. Electronically Signed   By: Jearld Lesch M.D.   On: 08/26/2022 13:01    DG Lumbar Spine 2-3 Views  Result Date: 08/26/2022 CLINICAL DATA:  Lower extremity weakness. Chronic diastolic heart failure. Progressive pain and difficulty ambulating. EXAM: LUMBAR SPINE - 2-3 VIEW COMPARISON:  Lumbar spine MRI from 06/26/2020 FINDINGS: Normal alignment. The vertebral body heights are well preserved. No signs of acute fracture or subluxation. Moderate multilevel disc space narrowing and anterior and posterior spur formation. Facet arthropathy identified within the lower lumbar spine. Aortic atherosclerosis. Moderate stool burden noted within the colon. IMPRESSION: 1. No acute findings. 2. Moderate multilevel degenerative disc disease and facet arthropathy. Electronically Signed   By: Signa Kell M.D.   On: 08/26/2022 10:37   DG Chest Port 1 View  Result Date: 08/26/2022 CLINICAL DATA:  Weakness EXAM: PORTABLE CHEST 1 VIEW COMPARISON:  Prior chest x-ray 03/29/2022 FINDINGS: Stable cardiomegaly and pulmonary vascular congestion without overt edema. Linear airspace opacities in the lung bases favored to reflect atelectasis. Lung volumes are low. No pleural effusion or pneumothorax. Bilateral shoulder arthroplasties and partially imaged cervical stabilization hardware. IMPRESSION: 1. Cardiomegaly with increased pulmonary vascular congestion but no overt edema. 2. Low lung volumes with bibasilar atelectasis. Electronically Signed   By: Malachy Moan M.D.   On: 08/26/2022 08:55    ECHO 09/13/2021:  ECHOCARDIOGRAPHIC MEASUREMENTS  2D DIMENSIONS  AORTA                  Values   Normal Range   MAIN PA         Values    Normal Range                Annulus: 1.9 cm       [2.1-2.5]         PA Main: nm*       [1.5-2.1]              Aorta Sin: 3.1 cm       [2.7-3.3]    RIGHT VENTRICLE            ST Junction: nm*          [2.3-2.9]  RV Base: nm*       [<4.2]             Asc.Aorta: 3.3 cm       [2.3-3.1]          RV Mid: 2.9 cm    [<3.5]  LEFT VENTRICLE                                       RV Length: nm*       [<8.6]                 LVIDd: 4.3 cm       [3.9-5.3]    INFERIOR VENA CAVA                  LVIDs: 3.1 cm                        Max. IVC: nm*       [<=2.1]                    FS: 28.3 %       [>25]            Min. IVC: nm*                    SWT: 1.1 cm       [0.5-0.9]    ------------------                    PWT: 1.1 cm       [0.5-0.9]    nm* - not measured  LEFT ATRIUM                LA Diam: 4.0 cm       [2.7-3.8]            LA A4C Area: nm*          [<20]             LA Volume: nm*          [22-52]  _________________________________________________________________________________________  ECHOCARDIOGRAPHIC DESCRIPTIONS  AORTIC ROOT                   Size: Normal             Dissection: INDETERM FOR DISSECTION  AORTIC VALVE               Leaflets: Tricuspid                   Morphology: Normal               Mobility: Fully mobile  LEFT VENTRICLE                   Size: Normal                        Anterior: Normal            Contraction: Normal                         Lateral: Normal             Closest EF: >55% (Estimated)                Septal: Normal  LV Masses: No Masses                       Apical: Normal                    LVH: None                          Inferior: Normal                                                      Posterior: Normal           Dias.FxClass: (Grade 1) relaxation abnormal, E/A reversal  MITRAL VALVE               Leaflets: Normal                        Mobility: Fully mobile             Morphology: ANNULAR CALC  LEFT ATRIUM                   Size: Normal                       LA Masses: No masses              IA Septum: Normal IAS  MAIN PA                   Size: Normal  PULMONIC VALVE             Morphology: Normal                        Mobility: Fully mobile  RIGHT VENTRICLE              RV Masses: No Masses                         Size: Normal              Free Wall: Normal                      Contraction: Normal  TRICUSPID VALVE               Leaflets: Normal                        Mobility: Fully mobile             Morphology: Normal  RIGHT ATRIUM                   Size: Normal                        RA Other: None                RA Mass: No masses  PERICARDIUM                 Fluid: No effusion  INFERIOR VENACAVA                   Size: Normal Normal respiratory collapse  _________________________________________________________________________________________   DOPPLER ECHO and OTHER SPECIAL PROCEDURES                 Aortic: No AR                      No AS                         143.0 cm/sec peak vel      8.2 mmHg peak grad                 Mitral: TRIVIAL MR                 No MS                         MV Inflow E Vel = 123.0 cm/sec      MV Annulus E'Vel = 7.6 cm/sec                         E/E'Ratio = 16.2              Tricuspid: TRIVIAL TR                 No TS              Pulmonary: MILD PR                    No PS                         128.0 cm/sec peak vel      6.6 mmHg peak grad  _________________________________________________________________________________________  INTERPRETATION  NORMAL LEFT VENTRICULAR SYSTOLIC FUNCTION  NORMAL RIGHT VENTRICULAR SYSTOLIC FUNCTION  NO VALVULAR STENOSIS  TRIVIAL MR, TR  MILD PR  EF >55%   TELEMETRY reviewed by me (LT) 08/27/2022 : NSR PACs PVCs rate 60s-70s  EKG reviewed by me: sinus rhythm PACs PVC, reviewed by myself and Dr. Darrold Junker  Data reviewed by me (LT) 08/27/2022: last 24h vitals tele labs imaging I/O, admission H&P, ED provider note, nursing notes  Principal Problem:   CHF exacerbation (HCC) Active Problems:   Acute on chronic diastolic CHF (congestive heart failure) (HCC)   HTN (hypertension)   COPD (chronic obstructive pulmonary disease) (HCC)   Hypothyroidism   Swelling of lower extremity    ASSESSMENT AND PLAN:  Hannah Powell is a 77 y.o. female  with a past medical history of chronic  HFpEF (>55%, g1DD 08/2021), COPD, OSA on BiPAP, chronic lower extremity edema, hypertension, hypothyroidism who presented to [facility] ED on 08/26/2022 for worsening lower extremity weakness and edema.  Cardiology was consulted for further assistance.   # acute on chronic diastolic heart failure # lower extremity edema # leg weakness / deconditioning Patient with worsening of lower extremity weakness and edema over the last week, had an episode 1 week ago where she was unable to stand up off the commode (one that was unusually low to the ground) - feels as though this really wore her out and that she's still recovering. She is without shortness of breath, CXR with some increased pulmonary vascular congestion. Clinically hypervolemic with tense LE edema, lungs clear.  -continue IV lasix 80mg  this AM and 60mg  IV x 2 more doses and reassess. On lasix 80mg  PO BID at home.  - restart spiro  12.5mg  PO daily  - continue to monitor I/Os -LE [place] negative for DVT -appreciate PT/OT assistance -Echo pending read  # Hypertension BP controlled this AM, monitor closely with diuresis -restart spiro as above. Hold losartan with soft BPs -Monitor renal function closely -Monitor and replenish electrolytes for a goal K >4, Mag >2   # COPD # OSA on BiPAP Appears stable with no worsening shortness of breath, cough, dyspnea. -Continue home inhalers -Continue BiPAP nightly for OSA  # hypothyroidism - on levothyroxine daily. Recheck TSH, free T4   This patient's plan of care was discussed and created with Dr. Juliann Pares and he is in agreement.  Signed: Rebeca Allegra, PA-C  08/27/2022, 8:52 AM [facility] Cardiology

## 2022-08-27 NOTE — Progress Notes (Signed)
*  PRELIMINARY RESULTS* Echocardiogram 2D Echocardiogram has been performed.  Hannah Powell 08/27/2022, 8:34 AM

## 2022-08-27 NOTE — ED Notes (Signed)
Patient BP improved very briefly after bolus but is now 75/51. Patient remains asymptomatic, this RN made new covering provider aware. Provider ordered repositioning of patient and 250 ml bolus.

## 2022-08-27 NOTE — Progress Notes (Signed)
Progress Note   Patient: Hannah Powell ZOX:[id number] DOB: [birth year] DOA: 08/26/2022     0 DOS: the patient was seen and examined on 08/27/2022   Brief hospital course: Taken from H&P and prior notes.   Hannah Powell is a 77 y.o. female with medical history significant of morbid obesity, COPD, diastolic CHF, OSA on BiPAP presenting with acute diastolic CHF exacerbation, lower extremity weakness.  Patient reports worsening lower extremity weakness and swelling over the past 2 weeks or so.  Minimal orthopnea and PND.  No chest pain.   Takes 80 mg p.o. Lasix twice a day. No missed doses. Patient does admit to eating some salty food. Also reports regular ibuprofen use for generalized pain.   ED course and data reviewed.  Hemodynamically stable, stable labs, BNP 190.  Chest x-ray with cardiomegaly and increased pulmonary vascular congestion.  Patient received 80 mg of IV Lasix in the ED. [facility] clinic cardiology was consulted as she was an established patient with them.  Repeat echocardiogram ordered.  6/10:Overnight patient became more hypotensive and mildly hypoxic requiring 2 L of oxygen.  Received 250 cc bolus x 2.  Lower extremity venous Doppler was negative for DVT.  Renal function stable with some improvement in BUN. Echocardiogram-done with pending results PT/OT evaluation-recommending home health. Cardiology would like to continue IV diuresis for another day.  Assessment and Plan: * Acute on chronic diastolic CHF (congestive heart failure) (HCC) Acute decompensated diastolic heart failure exacerbation with noted significantly worsening lower extremity swelling and weakness over multiple weeks On high-dose diuretic with 80 mg twice daily of Lasix at home Marked pitting edema in lower extremities bilaterally with mild venous stasis changes  Suspect concomitant high-dose regular NSAID use is likely trigger BNP 190 Chest x-ray with cardiomegaly and vascular congestion Status  post 80 mg of IV Lasix in ER with good urine output thus far Stressed NSAID avoidance -Cardiology is on board and would like to continue IV diuresis for another day -Repeat echocardiogram done-pending results -Continue with IV Lasix -Strict intake and output -Daily weight and BMP  Swelling of lower extremity Marked bilateral lower extremity swelling in the setting of baseline diastolic heart failure with high NSAID use-started improving Suspect dependent edema with associated volume overload Positive bilateral lower extremity redness Do not suspect component of infection or cellulitis at present as patient is afebrile, without white count-there may be an element of possible venous stasis.  Lower extremity venous Doppler was negative for DVT Diurese as appropriate Place TED hose Otherwise continue to monitor  HTN (hypertension) BP stable  Titrate home regimen    COPD (chronic obstructive pulmonary disease) (HCC) Stable from a resp standpoint  Cont home inhalers    Hypothyroidism Cont synthroid    Subjective: Patient was sitting comfortably in chair.  Denies any shortness of breath or chest pain.  She did became out of breath while working with PT.  Back pain interfere with laying flat.  Denies any orthopnea  Physical Exam: Vitals:   08/27/22 1115 08/27/22 1115 08/27/22 1130 08/27/22 1200  BP:   120/68 124/79  Pulse: 67  71 68  Resp: (!) 24  (!) 21 17  Temp:      TempSrc:      SpO2: 93% (!) 89% 92% 95%  Weight:      Height:       General.  Obese elderly lady, in no acute distress. Pulmonary.  Lungs clear bilaterally, normal respiratory effort. CV.  Regular rate and rhythm,  no JVD, rub or murmur. Abdomen.  Soft, nontender, nondistended, BS positive. CNS.  Alert and oriented .  No focal neurologic deficit. Extremities.  2+ LE edema,  pulses intact and symmetrical. Signs of chronic venous congestion Psychiatry.  Judgment and insight appears normal.   Data  Reviewed: Prior data reviewed  Family Communication: Talked with husband on phone.  Disposition: Status is: Inpatient Remains inpatient appropriate because: Severity of illness  Planned Discharge Destination: Home with Home Health  Time spent: 45 minutes  This record has been created using Conservation officer, historic buildings. Errors have been sought and corrected,but may not always be located. Such creation errors do not reflect on the standard of care.   Author: Arnetha Courser, MD 08/27/2022 2:07 PM  For on call review [email or web address].

## 2022-08-27 NOTE — ED Notes (Signed)
Cardiology at bedside.

## 2022-08-27 NOTE — TOC Initial Note (Signed)
Transition of Care [facility]) - Initial/Assessment Note    Patient Details  Name: Hannah Powell MRN: [id number] Date of Birth: [birth year]  [facility]) CM/SW Contact:    Darolyn Rua, LCSW Phone Number: 08/27/2022, 3:07 PM  Clinical Narrative:                  CSW spoke with patient and spouse regarding home health recommendations. They report being in agreement, open to hh referrals to be sent out for accepting agency. CSW has sent [facility] PT referral to [facility] with Frances Furbish. Patient reports no dme needs has walker, wheelchair, lift chair lift at home. Confirmed address and pcp as accurate in chart.   No further discharge planning needs identified at this time.  Patient does confirm she is typically on home o2.   Expected Discharge Plan: Home w Home Health Services Barriers to Discharge: Continued Medical Work up   Patient Goals and CMS Choice   CMS Medicare.gov Compare Post Acute Care list provided to:: Patient Choice offered to / list presented to : Patient      Expected Discharge Plan and Services       Living arrangements for the past 2 months: Single Family Home                           HH Arranged: PT HH Agency: [facility] Date [facility] Agency Contacted: 08/27/22 Time HH Agency Contacted: 1507 Representative spoke with at [facility] Agency: cory  Prior Living Arrangements/Services Living arrangements for the past 2 months: Single Family Home Lives with:: Spouse   Do you feel safe going back to the place where you live?: Yes               Activities of Daily Living      Permission Sought/Granted                  Emotional Assessment              Admission diagnosis:  CHF exacerbation (HCC) [I50.9] Patient Active Problem List   Diagnosis Date Noted   CHF exacerbation (HCC) 08/26/2022   Swelling of lower extremity 08/26/2022   Acute on chronic respiratory failure with hypoxia and hypercapnia (HCC) 03/29/2022   Chronic  diastolic CHF (congestive heart failure) (HCC) 03/29/2022   Cellulitis of right lower extremity 03/29/2022   Restless leg syndrome 02/08/2022   Hypothyroidism 02/08/2022   Obesity (BMI 30-39.9) 02/08/2022   Insomnia 02/08/2022   Anxiety 02/08/2022   Poor appetite 02/08/2022   S/P reverse total shoulder arthroplasty, 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   Chronic diastolic heart failure (HCC) 08/09/2016   COPD (chronic obstructive pulmonary disease) (HCC) 08/09/2016   Pulmonary infiltrates    Pneumonia due to Streptococcus pneumoniae (HCC)    Fever    Community acquired pneumonia of right lung    Palliative care by specialist    Goals of care, counseling/discussion    Pressure injury of skin 07/03/2016   Acute respiratory failure with hypoxia (HCC) 07/02/2016   Acute on chronic diastolic CHF (congestive heart failure) (HCC) 07/21/2015   COPD exacerbation (HCC) 07/21/2015   Acute renal insufficiency 07/21/2015   Hyperglycemia 07/21/2015   HTN (hypertension) 07/21/2015   S/P shoulder replacement 07/02/2013   PCP:  Lynnea Ferrier, MD Pharmacy:   [facility] [address] [place] - [address] [place] [address] Phone: [phone] Fax: [phone]  [facility] - [place], [place] - [address] [place] [address] Phone: [phone] Fax: [phone]     Social Determinants of Health (SDOH) Social History: SDOH Screenings   Food Insecurity: No Food Insecurity (03/29/2022)  Housing: Low Risk  (03/29/2022)  Transportation Needs: No Transportation Needs (03/29/2022)  Utilities: Not At Risk (03/29/2022)  Depression (PHQ2-9): Low Risk  (01/10/2021)  Tobacco Use: Medium Risk (08/26/2022)   SDOH Interventions:     Readmission Risk Interventions     No data to display

## 2022-08-27 NOTE — Evaluation (Signed)
Physical Therapy Evaluation Patient Details Name: Hannah Powell MRN: [id number] DOB: Sep 17, 1945 Today's Date: 08/27/2022  History of Present Illness  Hannah Powell is a 77 y.o. female with medical history significant of morbid obesity, COPD, diastolic CHF, OSA on BiPAP presenting with acute diastolic CHF exacerbation, lower extremity weakness.   Has had marked lower extremity swelling over the past week or so with inability to ambulate secondary to weakness and pain.    Clinical Impression  Pt evaluation questions and assessment were performed earlier and therapist returned to room once shoes were given to her from husband.  Upon arrival to the second portion of the session, pt sitting upright on the [facility] with OT in room.  Pt assisted with all personal care items and then performed STS to utilize personal upright walker in order to ambulate within the ED.  Pt demonstrated steady gait pattern, however it was slow in speed.  Pt able to ambulate to approximately 1/2 of the nursing loop in the ED.  Therapist brought recliner for chair follow while husband assisted with O2 tank.  Pt then returned to the room in the recliner with all needs met and call bell within reach.  Pt and therapist discussed discharged options and pt refuses SNF and would appreciate continue PT services following d/c.         Recommendations for follow up therapy are one component of a multi-disciplinary discharge planning process, led by the attending physician.  Recommendations may be updated based on patient status, additional functional criteria and insurance authorization.     Assistance Recommended at Discharge PRN  Patient can return home with the following  A little help with walking and/or transfers;A little help with bathing/dressing/bathroom;Assist for transportation;Help with stairs or ramp for entrance    Equipment Recommendations None recommended by PT  Recommendations for Other Services        Functional Status Assessment Patient has had a recent decline in their functional status and demonstrates the ability to make significant improvements in function in a reasonable and predictable amount of time.     Precautions / Restrictions Precautions Precautions: None Restrictions Weight Bearing Restrictions: No      Mobility  Bed Mobility               General bed mobility comments: pt upright in sitting on BSc upon arrival to the room.    Transfers Overall transfer level: Needs assistance Equipment used: Rolling walker (2 wheels) Transfers: Sit to/from Stand Sit to Stand: Min guard           General transfer comment: pt able to complete STS from elevated BSC with heavy UE usage    Ambulation/Gait Ambulation/Gait assistance: Min guard, Supervision Gait Distance (Feet): 80 Feet Assistive device: Rolling walker (2 wheels) Gait Pattern/deviations: Step-through pattern, Trunk flexed Gait velocity: significantly decreased     General Gait Details: pt with forward flexed posture evene with personal standing walker.  Stairs            Wheelchair Mobility    Modified Rankin (Stroke Patients Only)       Balance Overall balance assessment: Needs assistance Sitting-balance support: Feet unsupported, No upper extremity supported Sitting balance-Leahy Scale: Fair     Standing balance support: Bilateral upper extremity supported, During functional activity, Reliant on assistive device for balance Standing balance-Leahy Scale: Fair  Pertinent Vitals/Pain Pain Assessment Pain Assessment: No/denies pain    Home Living Family/patient expects to be discharged to:: Private residence Living Arrangements: Spouse/significant other Available Help at Discharge: Family;Available 24 hours/day Type of Home: House Home Access: Stairs to enter Entrance Stairs-Rails: Can reach both Entrance Stairs-Number of Steps: 5    Home Layout: One level Home Equipment: Crutches;Standard Building surveyor (2 wheels);Grab bars - toilet;Grab bars - tub/shower;Hand held shower head Additional Comments: depending on entrance has 2 rails or 1 rail    Prior Function Prior Level of Function : Independent/Modified Independent                     Hand Dominance   Dominant Hand: Right    Extremity/Trunk Assessment   Upper Extremity Assessment Upper Extremity Assessment: Defer to OT evaluation    Lower Extremity Assessment Lower Extremity Assessment: Generalized weakness    Cervical / Trunk Assessment Cervical / Trunk Assessment: Kyphotic  Communication   Communication: No difficulties  Cognition Arousal/Alertness: Awake/alert Behavior During Therapy: WFL for tasks assessed/performed Overall Cognitive Status: Within Functional Limits for tasks assessed                                          General Comments      Exercises     Assessment/Plan    PT Assessment Patient needs continued PT services  PT Problem List Decreased strength;Decreased activity tolerance;Decreased balance;Decreased mobility;Decreased safety awareness;Obesity       PT Treatment Interventions DME instruction;Gait training;Stair training;Functional mobility training;Therapeutic activities;Therapeutic exercise;Balance training;Neuromuscular re-education    PT Goals (Current goals can be found in the Care Plan section)  Acute Rehab PT Goals Patient Stated Goal: to improve strength and return home. PT Goal Formulation: With patient Time For Goal Achievement: 09/10/22 Potential to Achieve Goals: Good    Frequency Min 2X/week     Co-evaluation   Reason for Co-Treatment: For patient/therapist safety PT goals addressed during session: Mobility/safety with mobility OT goals addressed during session: ADL's and self-care       AM-PAC PT "6 Clicks" Mobility  Outcome Measure Help needed turning from  your back to your side while in a flat bed without using bedrails?: A Little Help needed moving from lying on your back to sitting on the side of a flat bed without using bedrails?: A Little Help needed moving to and from a bed to a chair (including a wheelchair)?: A Little Help needed standing up from a chair using your arms (e.g., wheelchair or bedside chair)?: A Little Help needed to walk in hospital room?: A Little Help needed climbing 3-5 steps with a railing? : A Lot 6 Click Score: 17    End of Session Equipment Utilized During Treatment: Gait belt Activity Tolerance: Patient tolerated treatment well;Patient limited by fatigue Patient left: in chair;with call bell/phone within reach;with family/visitor present Nurse Communication: Mobility status PT Visit Diagnosis: Unsteadiness on feet (R26.81);Other abnormalities of gait and mobility (R26.89);Muscle weakness (generalized) (M62.81);Difficulty in walking, not elsewhere classified (R26.2);History of falling (Z91.81)    Time: 1610-9604 PT Time Calculation (min) (ACUTE ONLY): 32 min   Charges:   PT Evaluation $PT Eval Low Complexity: 1 Low PT Treatments $Therapeutic Activity: 8-22 mins        Nolon Bussing, PT, DPT Physical Therapist - [place]  [facility]  08/27/22, 12:45 PM

## 2022-08-27 NOTE — Evaluation (Signed)
Occupational Therapy Evaluation Patient Details Name: Hannah Powell MRN: [id number] DOB: 25-Apr-1945 Today's Date: 08/27/2022   History of Present Illness Hannah Powell is a 77 y.o. female with medical history significant of morbid obesity, COPD, diastolic CHF, OSA on BiPAP presenting with acute diastolic CHF exacerbation, lower extremity weakness.   Has had marked lower extremity swelling over the past week or so with inability to ambulate secondary to weakness and pain.   Clinical Impression   [name] was seen for OT evaluation this date. Prior to hospital admission, pt was MOD I using elevated RW for mobility. Pt lives with spouse in home c 5 STE. Pt currently requires MIN A + HHA sit>stand and bed>BSC pivot t/f. Improves to CGA + RW using personal AD for sit<>stand and standing pericare. MOD A don underwear. Spouse reports ability to provide physical assist upon d/c. Pt would benefit from skilled OT to address noted impairments and functional limitations (see below for any additional details). Upon hospital discharge, recommend follow up OT.    Recommendations for follow up therapy are one component of a multi-disciplinary discharge planning process, led by the attending physician.  Recommendations may be updated based on patient status, additional functional criteria and insurance authorization.   Assistance Recommended at Discharge Frequent or constant Supervision/Assistance  Patient can return home with the following A lot of help with walking and/or transfers;A lot of help with bathing/dressing/bathroom;Help with stairs or ramp for entrance    Functional Status Assessment  Patient has had a recent decline in their functional status and demonstrates the ability to make significant improvements in function in a reasonable and predictable amount of time.  Equipment Recommendations  BSC/3in1    Recommendations for Other Services       Precautions / Restrictions  Precautions Precautions: None Restrictions Weight Bearing Restrictions: No      Mobility Bed Mobility Overal bed mobility: Needs Assistance Bed Mobility: Supine to Sit     Supine to sit: Mod assist     General bed mobility comments: assist for trunk and scooting towards EOB    Transfers Overall transfer level: Needs assistance Equipment used: Rolling walker (2 wheels) (upright rolling walker) Transfers: Sit to/from Stand, Bed to chair/wheelchair/BSC Sit to Stand: Min assist, From elevated surface     Step pivot transfers: Min assist     General transfer comment: improves to CGA from [facility]      Balance Overall balance assessment: Needs assistance Sitting-balance support: Feet unsupported, No upper extremity supported Sitting balance-Leahy Scale: Fair     Standing balance support: Single extremity supported, During functional activity Standing balance-Leahy Scale: Fair                             ADL either performed or assessed with clinical judgement   ADL Overall ADL's : Needs assistance/impaired                                       General ADL Comments: MIN A for BSC t/f. MOD A don underwear. CGA + RW standing pericare.      Pertinent Vitals/Pain Pain Assessment Pain Assessment: No/denies pain     Hand Dominance Right   Extremity/Trunk Assessment Upper Extremity Assessment Upper Extremity Assessment: Overall WFL for tasks assessed   Lower Extremity Assessment Lower Extremity Assessment: Generalized weakness   Cervical / Trunk  Assessment Cervical / Trunk Assessment: Kyphotic   Communication Communication Communication: No difficulties   Cognition Arousal/Alertness: Awake/alert Behavior During Therapy: WFL for tasks assessed/performed Overall Cognitive Status: Within Functional Limits for tasks assessed                                        Home Living Family/patient expects to be discharged to::  Private residence Living Arrangements: Spouse/significant other Available Help at Discharge: Family;Available 24 hours/day Type of Home: House Home Access: Stairs to enter [employer] of Steps: 5 Entrance Stairs-Rails: Can reach both Home Layout: One level     Bathroom Shower/Tub: Walk-in shower;Door   [employer] Toilet: Handicapped height (has a toilet riser) Bathroom Accessibility: Yes   Home Equipment: Crutches;Standard Walker;Rolling Walker (2 wheels);Grab bars - toilet;Grab bars - tub/shower;Hand held shower head   Additional Comments: depending on entrance has 2 rails or 1 rail      Prior Functioning/Environment Prior Level of Function : Independent/Modified Independent                        OT Problem List: Decreased strength;Decreased range of motion;Decreased activity tolerance;Impaired balance (sitting and/or standing)      OT Treatment/Interventions: Self-care/ADL training;Therapeutic exercise;Energy conservation;DME and/or AE instruction;Therapeutic activities;Patient/family education;Balance training    OT Goals(Current goals can be found in the care plan section) Acute Rehab OT Goals Patient Stated Goal: to go home OT Goal Formulation: With patient/family Time For Goal Achievement: 09/10/22 Potential to Achieve Goals: Good ADL Goals Pt Will Perform Grooming: with modified independence;standing Pt Will Perform Lower Body Dressing: with adaptive equipment;with caregiver independent in assisting;with min guard assist;sit to/from stand Pt Will Transfer to Toilet: with modified independence;ambulating;regular height toilet Pt Will Perform Toileting - Clothing Manipulation and hygiene: with modified independence;sit to/from stand  OT Frequency: Min 2X/week    Co-evaluation PT/OT/SLP Co-Evaluation/Treatment: Yes Reason for Co-Treatment: For patient/therapist safety PT goals addressed during session: Mobility/safety with mobility OT goals  addressed during session: ADL's and self-care      AM-PAC OT "6 Clicks" Daily Activity     Outcome Measure Help from another person eating meals?: None Help from another person taking care of personal grooming?: A Little Help from another person toileting, which includes using toliet, bedpan, or urinal?: A Little Help from another person bathing (including washing, rinsing, drying)?: A Lot Help from another person to put on and taking off regular upper body clothing?: A Little Help from another person to put on and taking off regular lower body clothing?: A Lot 6 Click Score: 17   End of Session Nurse Communication: Mobility status  Activity Tolerance: Patient tolerated treatment well Patient left:  (standing with PT)  OT Visit Diagnosis: Other abnormalities of gait and mobility (R26.89);Muscle weakness (generalized) (M62.81)                Time: 2130-8657 OT Time Calculation (min): 23 min Charges:  OT General Charges $OT Visit: 1 Visit OT Evaluation $OT Eval Low Complexity: 1 Low OT Treatments $Self Care/Home Management : 8-22 mins  Kathie Dike, M.S. OTR/L  08/27/22, 1:28 PM  ascom [phone]

## 2022-08-27 NOTE — Progress Notes (Addendum)
       CROSS COVER NOTE  NAME: Hannah Powell MRN: [id number] DOB : May 30, 1945    Concern as stated by nurse / staff   Patient is hypotensive, last BP 78/47. Patient presented to ED for leg pain and admitted for CHF, HTN, COPD, hypothyroidism. Patient is asleep after receiving trazadone and asymptomatic. (Per Nurse Tiffany on secure chat)     Pertinent findings on chart review: Patient admitted 6/9 with acute on chronic CHF.  Was previously on Lasix 80 mg p.o. twice daily at home Medication history since admission reviewed Received spironolactone 25 mg twice daily, Lasix 80 mg IV on arrival  BP has been soft since since admission Received 250 mL NS bolus at 0113 on 6/10 Still hypotensive after bolus    08/27/2022    3:00 AM 08/27/2022    2:45 AM 08/27/2022    2:30 AM  Vitals with BMI  Systolic 71 79 76  Diastolic 49 50 46  Pulse 59 60 59     Assessment and  Interventions   Assessment: Hypotension secondary to overdiuresis  Plan: Rebolus with NS  Monitor BP every 15 minutes.  Goal to keep MAP over 65 No bleeding suspected but will check H&H Lower head of bed to patient tolerance Continue close monitoring

## 2022-08-27 NOTE — ED Notes (Signed)
Pt assisted from recliner to bed without incident. Pt with mild exertional dyspnea.

## 2022-08-27 NOTE — Assessment & Plan Note (Signed)
Marked bilateral lower extremity swelling in the setting of baseline diastolic heart failure with high NSAID use-started improving Suspect dependent edema with associated volume overload Positive bilateral lower extremity redness Do not suspect component of infection or cellulitis at present as patient is afebrile, without white count-there may be an element of possible venous stasis.  Lower extremity venous Doppler was negative for DVT Diurese as appropriate Place TED hose Otherwise continue to monitor

## 2022-08-27 NOTE — ED Notes (Signed)
Patient BP has been running pretty soft for about 1-2 hours. The last is 84/47. She is asleep and asymptomatic, this RN sent secured chat message to make hospitalist aware. Provider ordered manual BP and 150 ml bolus normal saline with q15 min BP checks x 2 hours

## 2022-08-27 NOTE — Assessment & Plan Note (Signed)
Acute decompensated diastolic heart failure exacerbation with noted significantly worsening lower extremity swelling and weakness over multiple weeks On high-dose diuretic with 80 mg twice daily of Lasix at home Marked pitting edema in lower extremities bilaterally with mild venous stasis changes  Suspect concomitant high-dose regular NSAID use is likely trigger BNP 190 Chest x-ray with cardiomegaly and vascular congestion Status post 80 mg of IV Lasix in ER with good urine output thus far Stressed NSAID avoidance -Cardiology is on board and would like to continue IV diuresis for another day -Repeat echocardiogram done-pending results -Continue with IV Lasix -Strict intake and output -Daily weight and BMP

## 2022-08-28 ENCOUNTER — Inpatient Hospital Stay: Payer: Medicare HMO

## 2022-08-28 DIAGNOSIS — M7989 Other specified soft tissue disorders: Secondary | ICD-10-CM | POA: Diagnosis not present

## 2022-08-28 DIAGNOSIS — I5033 Acute on chronic diastolic (congestive) heart failure: Secondary | ICD-10-CM | POA: Diagnosis not present

## 2022-08-28 DIAGNOSIS — M5412 Radiculopathy, cervical region: Secondary | ICD-10-CM | POA: Diagnosis not present

## 2022-08-28 DIAGNOSIS — I10 Essential (primary) hypertension: Secondary | ICD-10-CM | POA: Diagnosis not present

## 2022-08-28 DIAGNOSIS — E039 Hypothyroidism, unspecified: Secondary | ICD-10-CM | POA: Diagnosis not present

## 2022-08-28 LAB — BASIC METABOLIC PANEL
Anion gap: 8 (ref 5–15)
BUN: 29 mg/dL — ABNORMAL HIGH (ref 8–23)
CO2: 30 mmol/L (ref 22–32)
Calcium: 8.1 mg/dL — ABNORMAL LOW (ref 8.9–10.3)
Chloride: 101 mmol/L (ref 98–111)
Creatinine, Ser: 0.94 mg/dL (ref 0.44–1.00)
GFR, Estimated: >60 mL/min (ref >60)
Glucose, Bld: 112 mg/dL — ABNORMAL HIGH (ref 70–99)
Potassium: 3.8 mmol/L (ref 3.5–5.1)
Sodium: 139 mmol/L (ref 135–145)

## 2022-08-28 MED ORDER — KETOROLAC TROMETHAMINE 60 MG/2ML IM SOLN
15.0000 mg | Freq: Four times a day (QID) | INTRAMUSCULAR | Status: DC | PRN
  Administered 2022-08-28: 15 mg via INTRAMUSCULAR
  Filled 2022-08-28: qty 2

## 2022-08-28 MED ORDER — HYDROMORPHONE HCL 1 MG/ML IJ SOLN
1.0000 mg | Freq: Once | INTRAMUSCULAR | Status: AC
  Administered 2022-08-28: 1 mg via INTRAVENOUS
  Filled 2022-08-28: qty 1

## 2022-08-28 MED ORDER — IBUPROFEN 800 MG PO TABS: 800.0000 mg | ORAL_TABLET | Freq: Three times a day (TID) | ORAL | Status: DC

## 2022-08-28 MED ORDER — LORAZEPAM 2 MG/ML IJ SOLN
1.0000 mg | Freq: Once | INTRAMUSCULAR | Status: AC
  Administered 2022-08-28: 1 mg via INTRAVENOUS
  Filled 2022-08-28: qty 1

## 2022-08-28 MED ORDER — GABAPENTIN 300 MG PO CAPS
300.0000 mg | ORAL_CAPSULE | Freq: Two times a day (BID) | ORAL | Status: DC
  Administered 2022-08-28 – 2022-08-29 (×3): 300 mg via ORAL
  Filled 2022-08-28 (×3): qty 1

## 2022-08-28 MED ORDER — GADOBUTROL 1 MMOL/ML IV SOLN
9.0000 mL | Freq: Once | INTRAVENOUS | Status: AC | PRN
  Administered 2022-08-28: 9 mL via INTRAVENOUS

## 2022-08-28 MED ORDER — ALPRAZOLAM 0.25 MG PO TABS
0.2500 mg | ORAL_TABLET | Freq: Three times a day (TID) | ORAL | Status: DC | PRN
  Administered 2022-08-28: 0.25 mg via ORAL
  Filled 2022-08-28: qty 1

## 2022-08-28 MED ORDER — CALCIUM CARBONATE 1250 (500 CA) MG PO TABS
1.0000 | ORAL_TABLET | Freq: Every day | ORAL | Status: DC
  Administered 2022-08-29: 1250 mg via ORAL
  Filled 2022-08-28: qty 1

## 2022-08-28 NOTE — Assessment & Plan Note (Signed)
Marked bilateral lower extremity swelling in the setting of baseline diastolic heart failure with high NSAID use-started improving Suspect dependent edema with associated volume overload Positive bilateral lower extremity redness Do not suspect component of infection or cellulitis at present as patient is afebrile, without white count-there may be an element of possible venous stasis.  Lower extremity venous Doppler was negative for DVT Diurese as appropriate Place TED hose Otherwise continue to monitor

## 2022-08-28 NOTE — ED Notes (Signed)
Pt given bed bath by this tech. Pt had some urine on underwear, underwear taken off and placed in belongings bag. Placed clean brief and purewick on pt. A new chux was placed under pt. Pt was given a clean gown and warm blankets. Pt placed back on card monitor. Call light within reach and bed in lower position. Pt requesting meds and graham crackers- will notify Tiffany RN.   No other needs voiced at this time.

## 2022-08-28 NOTE — Progress Notes (Signed)
[facility] CLINIC CARDIOLOGY CONSULT NOTE       Patient ID: Hannah Powell MRN: [id number] DOB/AGE: [birth year] 77 y.o.  Admit date: 08/26/2022 Referring Physician Dr. Doree Albee Primary Physician Dr. Daniel Nones Primary Cardiologist Dr. Gwen Pounds Reason for Consultation acute on chronic diastolic heart failure  HPI: Hannah Powell is a 77 y.o. female  with a past medical history of chronic HFpEF (>55%, g1DD 08/2021), COPD, OSA on BiPAP, chronic lower extremity edema, hypertension, hypothyroidism who presented to [facility] ED on 08/26/2022 for worsening lower extremity weakness and edema.  Cardiology was consulted for further assistance.   Interval History:  -In extreme pain radiating from her neck down left shoulder with numbness and tingling in 4th and 5th digits that started early AM, refractory to tramadol - no chest pain or dyspnea, peripheral edema much improved - BP soft  -Net IO Since Admission: -6,325 mL [08/28/22 1257]     Review of systems complete and found to be negative unless listed above    Past Medical History:  Diagnosis Date   Anxiety    Arthritis    CHF (congestive heart failure) (HCC)    COPD (chronic obstructive pulmonary disease) (HCC)    Dysplastic nevus 04/22/2019   R abdomen parallel to sup umbilicus - mod   Dysplastic nevus 04/22/2019   R low back above waistline - mild    Edema    feet/legs   Hypertension    dr Darryll Capers      Hypothyroidism    Leg weakness, bilateral    Osteoporosis    Oxygen deficit    2l with bipap at night   RLS (restless legs syndrome)    Shortness of breath    Sleep apnea    bipap   Wheezing     Past Surgical History:  Procedure Laterality Date   BACK SURGERY     cervical   BREAST BIOPSY Left    needle bx-neg   CATARACT EXTRACTION W/PHACO Left 05/07/2017   Procedure: CATARACT EXTRACTION PHACO AND INTRAOCULAR LENS PLACEMENT (IOC);  Surgeon: Galen Manila, MD;  Location: [facility] ORS;  Service: Ophthalmology;   Laterality: Left;  [place] 00:48.7 AP% 11.3 CDE 5.46 Fluid Pack Lot # [id number]   CATARACT EXTRACTION W/PHACO Right 04/23/2017   Procedure: CATARACT EXTRACTION PHACO AND INTRAOCULAR LENS PLACEMENT (IOC);  Surgeon: Galen Manila, MD;  Location: [facility] ORS;  Service: Ophthalmology;  Laterality: Right;  [place] 00:29 AP% 12.8 CDE 3.79 FLUID PACK LOT # [id number]   FOOT ARTHROPLASTY     JOINT REPLACEMENT     x2 tkr   TOTAL SHOULDER ARTHROPLASTY Left 07/02/2013   Procedure: LEFT TOTAL SHOULDER ARTHROPLASTY;  Surgeon: Senaida Lange, MD;  Location: [facility] OR;  Service: Orthopedics;  Laterality: Left;   TOTAL SHOULDER ARTHROPLASTY Right 04/10/2018   Procedure: TOTAL REVERSE SHOULDER ARTHROPLASTY;  Surgeon: Francena Hanly, MD;  Location: [facility] ORS;  Service: Orthopedics;  Laterality: Right;     (Not in a hospital admission)  Social History   Socioeconomic History   Marital status: Married    Spouse name: Not on file   Number of children: Not on file   Years of education: Not on file   Highest education level: Not on file  Occupational History   Not on file  Tobacco Use   Smoking status: Former    Packs/day: 1.00    Years: 10.00    Additional pack years: 0.00    Total pack years: 10.00    Types: Cigarettes  Quit date: 03/20/1975    Years since quitting: 47.4   Smokeless tobacco: Never  Vaping Use   Vaping Use: Never used  Substance and Sexual Activity   Alcohol use: No   Drug use: No   Sexual activity: Not on file  Other Topics Concern   Not on file  Social History Narrative   Not on file   Social Determinants of Health   Financial Resource Strain: Not on file  Food Insecurity: No Food Insecurity (08/27/2022)   Hunger Vital Sign    Worried About Running Out of Food in the Last Year: Never true    Ran Out of Food in the Last Year: Never true  Transportation Needs: No Transportation Needs (08/27/2022)   PRAPARE - Administrator, Civil Service (Medical): No    Lack of  Transportation (Non-Medical): No  Physical Activity: Not on file  Stress: Not on file  Social Connections: Not on file  Intimate Partner Violence: Not At Risk (08/27/2022)   Humiliation, Afraid, Rape, and Kick questionnaire    Fear of Current or Ex-Partner: No    Emotionally Abused: No    Physically Abused: No    Sexually Abused: No    Family History  Problem Relation Age of Onset   Hypertension Mother    Diabetes Mother    Heart failure Father    Breast cancer Neg Hx      Vitals:   08/28/22 0830 08/28/22 1000 08/28/22 1030 08/28/22 1100  BP: 96/64 96/68 (!) 90/54 (!) 98/56  Pulse: 86 73 72 75  Resp: (!) 21 15 17  (!) 22  Temp:      TempSrc:      SpO2: 92% (!) 89% 92% 93%  Weight:      Height:        PHYSICAL EXAM General: Elderly female, well nourished, moaning in pain, sitting at incline in bed with husband and daughter present  HEENT: Normocephalic and atraumatic. Neck: No JVD.  Lungs: Normal respiratory effort on O2 by [place]. No wheezes, rhonchi. Decreased breath sounds in bases, no appreciable crackles Heart: HRRR. Normal S1 and S2 without gallops or murmurs.  Abdomen: Non-distended appearing.  Msk: Normal strength and tone for age. Extremities:  trace erythema to both lower legs with tenderness to palpation. Skin dimpling/wrinkling with trace edema bilaterally  Neuro: Alert and oriented X 3. Psych: Answers questions appropriately.   Labs: Basic Metabolic Panel: Recent Labs    08/27/22 0115 08/28/22 0538  NA 140 139  K 3.6 3.8  CL 103 101  CO2 29 30  GLUCOSE 103* 112*  BUN 29* 29*  CREATININE 1.00 0.94  CALCIUM 8.3* 8.1*  MG 2.5*  --    Liver Function Tests: Recent Labs    08/26/22 0647 08/27/22 0115  AST 24 20  ALT 13 9  ALKPHOS 103 83  BILITOT 0.2* 0.5  PROT 6.8 5.3*  ALBUMIN 4.0 3.2*   No results for input(s): "LIPASE", "AMYLASE" in the last 72 hours. CBC: Recent Labs    08/26/22 0647 08/27/22 0115  WBC 6.1 5.5  NEUTROABS 4.2  --   HGB  13.7 12.2  HCT 44.6 40.2  MCV 98.5 99.3  PLT 188 183   Cardiac Enzymes: No results for input(s): "CKTOTAL", "CKMB", "CKMBINDEX", "TROPONINIHS" in the last 72 hours. BNP: Recent Labs    08/26/22 0647  BNP 189.2*   D-Dimer: No results for input(s): "DDIMER" in the last 72 hours. Hemoglobin A1C: No results for input(s): "HGBA1C"  in the last 72 hours. Fasting Lipid Panel: No results for input(s): "CHOL", "HDL", "LDLCALC", "TRIG", "CHOLHDL", "LDLDIRECT" in the last 72 hours. Thyroid Function Tests: Recent Labs    08/27/22 0115  TSH 7.082*   Anemia Panel: No results for input(s): "VITAMINB12", "FOLATE", "FERRITIN", "TIBC", "IRON", "RETICCTPCT" in the last 72 hours.   Radiology: US Venous Img Lower Bilateral (DVT)  Result Date: 08/26/2022 CLINICAL DATA:  Lower extremity swelling EXAM: BILATERAL LOWER EXTREMITY VENOUS DOPPLER ULTRASOUND TECHNIQUE: Gray-scale sonography with compression, as well as color and duplex ultrasound, were performed to evaluate the deep venous system(s) from the level of the common femoral vein through the popliteal and proximal calf veins. COMPARISON:  None Available. FINDINGS: VENOUS Normal compressibility of the common femoral, superficial femoral, and popliteal veins, as well as the visualized calf veins. Visualized portions of profunda femoral vein and great saphenous vein unremarkable. No filling defects to suggest DVT on grayscale or color Doppler imaging. Doppler waveforms show normal direction of venous flow, normal respiratory plasticity and response to augmentation. Limited views of the contralateral common femoral vein are unremarkable. OTHER None. Limitations: none IMPRESSION: Negative examination for deep venous thrombosis in the bilateral lower extremities. Electronically Signed   By: Jearld Lesch M.D.   On: 08/26/2022 13:01   DG Lumbar Spine 2-3 Views  Result Date: 08/26/2022 CLINICAL DATA:  Lower extremity weakness. Chronic diastolic heart failure.  Progressive pain and difficulty ambulating. EXAM: LUMBAR SPINE - 2-3 VIEW COMPARISON:  Lumbar spine MRI from 06/26/2020 FINDINGS: Normal alignment. The vertebral body heights are well preserved. No signs of acute fracture or subluxation. Moderate multilevel disc space narrowing and anterior and posterior spur formation. Facet arthropathy identified within the lower lumbar spine. Aortic atherosclerosis. Moderate stool burden noted within the colon. IMPRESSION: 1. No acute findings. 2. Moderate multilevel degenerative disc disease and facet arthropathy. Electronically Signed   By: Signa Kell M.D.   On: 08/26/2022 10:37   DG Chest Port 1 View  Result Date: 08/26/2022 CLINICAL DATA:  Weakness EXAM: PORTABLE CHEST 1 VIEW COMPARISON:  Prior chest x-ray 03/29/2022 FINDINGS: Stable cardiomegaly and pulmonary vascular congestion without overt edema. Linear airspace opacities in the lung bases favored to reflect atelectasis. Lung volumes are low. No pleural effusion or pneumothorax. Bilateral shoulder arthroplasties and partially imaged cervical stabilization hardware. IMPRESSION: 1. Cardiomegaly with increased pulmonary vascular congestion but no overt edema. 2. Low lung volumes with bibasilar atelectasis. Electronically Signed   By: Malachy Moan M.D.   On: 08/26/2022 08:55    ECHO 09/13/2021:  ECHOCARDIOGRAPHIC MEASUREMENTS  2D DIMENSIONS  AORTA                  Values   Normal Range   MAIN PA         Values    Normal Range                Annulus: 1.9 cm       [2.1-2.5]         PA Main: nm*       [1.5-2.1]              Aorta Sin: 3.1 cm       [2.7-3.3]    RIGHT VENTRICLE            ST Junction: nm*          [2.3-2.9]         RV Base: nm*       [<4.2]  Asc.Aorta: 3.3 cm       [2.3-3.1]          RV Mid: 2.9 cm    [<3.5]  LEFT VENTRICLE                                      RV Length: nm*       [<8.6]                 LVIDd: 4.3 cm       [3.9-5.3]    INFERIOR VENA CAVA                  LVIDs:  3.1 cm                        Max. IVC: nm*       [<=2.1]                    FS: 28.3 %       [>25]            Min. IVC: nm*                    SWT: 1.1 cm       [0.5-0.9]    ------------------                    PWT: 1.1 cm       [0.5-0.9]    nm* - not measured  LEFT ATRIUM                LA Diam: 4.0 cm       [2.7-3.8]            LA A4C Area: nm*          [<20]             LA Volume: nm*          [22-52]  _________________________________________________________________________________________  ECHOCARDIOGRAPHIC DESCRIPTIONS  AORTIC ROOT                   Size: Normal             Dissection: INDETERM FOR DISSECTION  AORTIC VALVE               Leaflets: Tricuspid                   Morphology: Normal               Mobility: Fully mobile  LEFT VENTRICLE                   Size: Normal                        Anterior: Normal            Contraction: Normal                         Lateral: Normal             Closest EF: >55% (Estimated)                Septal: Normal              LV Masses: No Masses  Apical: Normal                    LVH: None                          Inferior: Normal                                                      Posterior: Normal           Dias.FxClass: (Grade 1) relaxation abnormal, E/A reversal  MITRAL VALVE               Leaflets: Normal                        Mobility: Fully mobile             Morphology: ANNULAR CALC  LEFT ATRIUM                   Size: Normal                       LA Masses: No masses              IA Septum: Normal IAS  MAIN PA                   Size: Normal  PULMONIC VALVE             Morphology: Normal                        Mobility: Fully mobile  RIGHT VENTRICLE              RV Masses: No Masses                         Size: Normal              Free Wall: Normal                     Contraction: Normal  TRICUSPID VALVE               Leaflets: Normal                        Mobility: Fully mobile              Morphology: Normal  RIGHT ATRIUM                   Size: Normal                        RA Other: None                RA Mass: No masses  PERICARDIUM                 Fluid: No effusion  INFERIOR VENACAVA                   Size: Normal Normal respiratory collapse  _________________________________________________________________________________________   DOPPLER ECHO and OTHER SPECIAL PROCEDURES                 Aortic:  No AR                      No AS                         143.0 cm/sec peak vel      8.2 mmHg peak grad                 Mitral: TRIVIAL MR                 No MS                         MV Inflow E Vel = 123.0 cm/sec      MV Annulus E'Vel = 7.6 cm/sec                         E/E'Ratio = 16.2              Tricuspid: TRIVIAL TR                 No TS              Pulmonary: MILD PR                    No PS                         128.0 cm/sec peak vel      6.6 mmHg peak grad  _________________________________________________________________________________________  INTERPRETATION  NORMAL LEFT VENTRICULAR SYSTOLIC FUNCTION  NORMAL RIGHT VENTRICULAR SYSTOLIC FUNCTION  NO VALVULAR STENOSIS  TRIVIAL MR, TR  MILD PR  EF >55%   TELEMETRY reviewed by me (LT) 08/28/2022 : NSR PACs PVCs rate 60s-70s  EKG reviewed by me: sinus rhythm PACs PVC, reviewed by myself and Dr. Darrold Junker  Data reviewed by me (LT) 08/28/2022: last 24h vitals tele labs imaging I/O, admission H&P, ED provider note, nursing notes  Principal Problem:   Acute on chronic diastolic CHF (congestive heart failure) (HCC) Active Problems:   HTN (hypertension)   COPD (chronic obstructive pulmonary disease) (HCC)   Hypothyroidism   Swelling of lower extremity    ASSESSMENT AND PLAN:  Hannah Powell is a 77 y.o. female  with a past medical history of chronic HFpEF (>55%, g1DD 08/2021), COPD, OSA on BiPAP, chronic lower extremity edema, hypertension, hypothyroidism who presented to [facility] ED on 08/26/2022 for  worsening lower extremity weakness and edema.  Cardiology was consulted for further assistance.   # acute on chronic diastolic heart failure # lower extremity edema # leg weakness / deconditioning Patient with worsening of lower extremity weakness and edema over the last week, had an episode 1 week ago where she was unable to stand up off the commode (one that was unusually low to the ground) - feels as though this really wore her out and that she's still recovering. She is without shortness of breath, CXR with some increased pulmonary vascular congestion on admission. Clinically improving from a volume standpoint, excellent diuresis so far.  -continue IV lasix 60mg  IV x 2 more doses and reassess before further dosing tomorrow AM. On lasix 80mg  PO BID at home.  - continue spiro 12.5mg  PO daily  - GDMT with BB, ARB, sglt2i limited by soft BP - continue to monitor I/Os -LE [place] negative for DVT -appreciate PT/OT  assistance -Echo pending read  # neck pain  With radiation down left arm with numbness/tingling in the ulnar distribution. MRI pending. Multimodal pain mgmt per primary.  # Hypertension BP controlled this AM, monitor closely with diuresis -restart spiro as above. Hold losartan with soft BPs -Monitor renal function closely -Monitor and replenish electrolytes for a goal K >4, Mag >2   # COPD # OSA on BiPAP Appears stable with no worsening shortness of breath, cough, dyspnea. -Continue home inhalers -Continue BiPAP nightly for OSA  # hypothyroidism - on levothyroxine daily.  -TSH uptrending to 7.082, free T4 WNL 0.78, BL from 04/03/22 was TSH 1.172 -recommend follow up with PCP   This patient's plan of care was discussed and created with Dr. Juliann Pares and he is in agreement.  Signed: Rebeca Allegra, PA-C  08/28/2022, 12:57 PM [facility] Card[facility]

## 2022-08-28 NOTE — Assessment & Plan Note (Addendum)
Patient with history of cervical fusion developed overnight sharp shooting pain starting from neck going towards left upper extremity with tingling and numbness involving ring and little finger.  Symptoms more like involving ulnar nerve.  Symptoms worsened with any movements around wrist. MRI cervical spine and wrist ordered. Pain management Also added gabapentin Neurosurgery versus orthopedic consult depending on MRI results

## 2022-08-28 NOTE — ED Notes (Signed)
Pt c/o pain from L side of neck radiating down to wrist with numbness to 4th and 5th phalanges. MD notified. Pt reports she has had previous surgery on her neck and pain like this in the past. Pt repositioned to try and achieve comfort.

## 2022-08-28 NOTE — ED Notes (Signed)
Pt returned to room  

## 2022-08-28 NOTE — ED Notes (Signed)
Patient transported to MRI 

## 2022-08-28 NOTE — Progress Notes (Signed)
Progress Note   Patient: Hannah Powell ZOX:[id number] DOB: [birth year] DOA: 08/26/2022     1 DOS: the patient was seen and examined on 08/28/2022   Brief hospital course: Taken from H&P and prior notes.   Hannah Powell is a 77 y.o. female with medical history significant of morbid obesity, COPD, diastolic CHF, OSA on BiPAP presenting with acute diastolic CHF exacerbation, lower extremity weakness.  Patient reports worsening lower extremity weakness and swelling over the past 2 weeks or so.  Minimal orthopnea and PND.  No chest pain.   Takes 80 mg p.o. Lasix twice a day. No missed doses. Patient does admit to eating some salty food. Also reports regular ibuprofen use for generalized pain.   ED course and data reviewed.  Hemodynamically stable, stable labs, BNP 190.  Chest x-ray with cardiomegaly and increased pulmonary vascular congestion.  Patient received 80 mg of IV Lasix in the ED. [facility] clinic cardiology was consulted as she was an established patient with them.  Repeat echocardiogram ordered.  6/10:Overnight patient became more hypotensive and mildly hypoxic requiring 2 L of oxygen.  Received 250 cc bolus x 2.  Lower extremity venous Doppler was negative for DVT.  Renal function stable with some improvement in BUN. Echocardiogram-done with pending results PT/OT evaluation-recommending home health. Cardiology would like to continue IV diuresis for another day.  6/11: Blood pressure borderline soft, started sharpshooting pain starting from neck going down on left upper extremity with tingling and numbness involving ring and little finger.  Has a remote history of cervical fusion.  MRI of cervical spine and wrist ordered.  Assessment and Plan: * Acute on chronic diastolic CHF (congestive heart failure) (HCC) Acute decompensated diastolic heart failure exacerbation with noted significantly worsening lower extremity swelling and weakness over multiple weeks On high-dose diuretic  with 80 mg twice daily of Lasix at home Marked pitting edema in lower extremities bilaterally with mild venous stasis changes -significantly improved Suspect concomitant high-dose regular NSAID use is likely trigger BNP 190 Chest x-ray with cardiomegaly and vascular congestion Status post 80 mg of IV Lasix in ER with good urine output thus far Stressed NSAID avoidance -Cardiology is on board and would like to continue IV diuresis for another day -Repeat echocardiogram done-pending results -Continue with IV Lasix -Strict intake and output -Daily weight and BMP  Swelling of lower extremity Marked bilateral lower extremity swelling in the setting of baseline diastolic heart failure with high NSAID use-started improving Suspect dependent edema with associated volume overload Positive bilateral lower extremity redness Do not suspect component of infection or cellulitis at present as patient is afebrile, without white count-there may be an element of possible venous stasis.  Lower extremity venous Doppler was negative for DVT Diurese as appropriate Place TED hose Otherwise continue to monitor  HTN (hypertension) BP stable  Titrate home regimen    COPD (chronic obstructive pulmonary disease) (HCC) Stable from a resp standpoint  Cont home inhalers    Hypothyroidism Cont synthroid   Cervical radiculopathy Patient with history of cervical fusion developed overnight sharp shooting pain starting from neck going towards left upper extremity with tingling and numbness involving ring and little finger.  Symptoms more like involving ulnar nerve.  Symptoms worsened with any movements around wrist. MRI cervical spine and wrist ordered. Pain management Also added gabapentin Neurosurgery versus orthopedic consult depending on MRI results   Subjective: Patient was crying with complaint of sharp shooting neck pain, radiating towards medial 2 fingers with associated tingling  and  numbness.  Physical Exam: Vitals:   08/28/22 1000 08/28/22 1030 08/28/22 1100 08/28/22 1325  BP: 96/68 (!) 90/54 (!) 98/56 (!) 90/54  Pulse: 73 72 75 70  Resp: 15 17 (!) 22 15  Temp:      TempSrc:      SpO2: (!) 89% 92% 93% 95%  Weight:      Height:       General.  Obese elderly lady, in no acute distress. Pulmonary.  Lungs clear bilaterally, normal respiratory effort. CV.  Regular rate and rhythm, no JVD, rub or murmur. Abdomen.  Soft, nontender, nondistended, BS positive. CNS.  Alert and oriented .  No focal neurologic deficit. Extremities.  No edema, no cyanosis, pulses intact and symmetrical. Musculoskeletal.  Worsening tingling and numbness and pain with movements around wrist and shoulder. Psychiatry.  Judgment and insight appears normal.   Data Reviewed: Prior data reviewed  Family Communication: Discussed with daughter at bedside  Disposition: Status is: Inpatient Remains inpatient appropriate because: Severity of illness  Planned Discharge Destination: Home with Home Health  Time spent: 50 minutes  This record has been created using Conservation officer, historic buildings. Errors have been sought and corrected,but may not always be located. Such creation errors do not reflect on the standard of care.   Author: Arnetha Courser, MD 08/28/2022 2:44 PM  For on call review [email or web address].

## 2022-08-28 NOTE — Assessment & Plan Note (Signed)
Acute decompensated diastolic heart failure exacerbation with noted significantly worsening lower extremity swelling and weakness over multiple weeks On high-dose diuretic with 80 mg twice daily of Lasix at home Marked pitting edema in lower extremities bilaterally with mild venous stasis changes -significantly improved Suspect concomitant high-dose regular NSAID use is likely trigger BNP 190 Chest x-ray with cardiomegaly and vascular congestion Status post 80 mg of IV Lasix in ER with good urine output thus far Stressed NSAID avoidance -Cardiology is on board and would like to continue IV diuresis for another day -Repeat echocardiogram done-pending results -Continue with IV Lasix -Strict intake and output -Daily weight and BMP

## 2022-08-29 ENCOUNTER — Telehealth: Payer: Self-pay | Admitting: Neurosurgery

## 2022-08-29 DIAGNOSIS — M79604 Pain in right leg: Secondary | ICD-10-CM | POA: Diagnosis not present

## 2022-08-29 DIAGNOSIS — M5412 Radiculopathy, cervical region: Secondary | ICD-10-CM

## 2022-08-29 DIAGNOSIS — I5033 Acute on chronic diastolic (congestive) heart failure: Secondary | ICD-10-CM | POA: Diagnosis not present

## 2022-08-29 DIAGNOSIS — M79605 Pain in left leg: Secondary | ICD-10-CM

## 2022-08-29 DIAGNOSIS — I10 Essential (primary) hypertension: Secondary | ICD-10-CM | POA: Diagnosis not present

## 2022-08-29 DIAGNOSIS — M7989 Other specified soft tissue disorders: Secondary | ICD-10-CM | POA: Diagnosis not present

## 2022-08-29 LAB — ECHOCARDIOGRAM COMPLETE
AR max vel: 2.8 cm2
AV Area VTI: 3.18 cm2
AV Area mean vel: 3.03 cm2
AV Mean grad: 5.5 mmHg
AV Peak grad: 9.7 mmHg
Ao pk vel: 1.56 m/s
Area-P 1/2: 3.81 cm2
Height: 66 in
MV VTI: 2.4 cm2
S' Lateral: 3.1 cm

## 2022-08-29 LAB — BASIC METABOLIC PANEL
Anion gap: 8 (ref 5–15)
BUN: 34 mg/dL — ABNORMAL HIGH (ref 8–23)
CO2: 29 mmol/L (ref 22–32)
Calcium: 8.3 mg/dL — ABNORMAL LOW (ref 8.9–10.3)
Chloride: 97 mmol/L — ABNORMAL LOW (ref 98–111)
Creatinine, Ser: 1.07 mg/dL — ABNORMAL HIGH (ref 0.44–1.00)
GFR, Estimated: 54 mL/min — ABNORMAL LOW (ref >60)
Glucose, Bld: 131 mg/dL — ABNORMAL HIGH (ref 70–99)
Potassium: 3.4 mmol/L — ABNORMAL LOW (ref 3.5–5.1)
Sodium: 134 mmol/L — ABNORMAL LOW (ref 135–145)

## 2022-08-29 MED ORDER — TORSEMIDE 20 MG PO TABS: 20.0000 mg | ORAL_TABLET | Freq: Two times a day (BID) | ORAL | 1 refills | Status: DC

## 2022-08-29 MED ORDER — TRAMADOL HCL 50 MG PO TABS: 50.0000 mg | ORAL_TABLET | Freq: Three times a day (TID) | ORAL | 0 refills | Status: AC | PRN

## 2022-08-29 MED ORDER — POTASSIUM CHLORIDE CRYS ER 20 MEQ PO TBCR: 20.0000 meq | EXTENDED_RELEASE_TABLET | Freq: Every day | ORAL | 1 refills | Status: AC

## 2022-08-29 MED ORDER — POTASSIUM CHLORIDE CRYS ER 20 MEQ PO TBCR: 40.0000 meq | EXTENDED_RELEASE_TABLET | Freq: Once | ORAL | Status: DC

## 2022-08-29 MED ORDER — GABAPENTIN 300 MG PO CAPS: 300.0000 mg | ORAL_CAPSULE | Freq: Two times a day (BID) | ORAL | 1 refills | Status: DC

## 2022-08-29 MED ORDER — TORSEMIDE 20 MG PO TABS: 20.0000 mg | ORAL_TABLET | Freq: Two times a day (BID) | ORAL | Status: DC

## 2022-08-29 MED ORDER — POTASSIUM CHLORIDE CRYS ER 20 MEQ PO TBCR
40.0000 meq | EXTENDED_RELEASE_TABLET | Freq: Once | ORAL | Status: AC
  Administered 2022-08-29: 40 meq via ORAL
  Filled 2022-08-29: qty 2

## 2022-08-29 MED ORDER — SPIRONOLACTONE 25 MG PO TABS: 12.5000 mg | ORAL_TABLET | Freq: Every day | ORAL | 1 refills | Status: AC

## 2022-08-29 NOTE — Progress Notes (Signed)
DC instructions and med list reviewed with pt and SO, both verbalized understanding.  Written info provided and reviewed RU:EAVWU failure and low NA diet.  Pt to follow up with PCP and cards.  IV removed, purewick removed.  PT dressed and ready for discharge

## 2022-08-29 NOTE — Progress Notes (Signed)
Occupational Therapy Treatment Patient Details Name: Hannah Powell MRN: [id number] DOB: [birth year] Today's Date: 08/29/2022   History of present illness Hannah Powell is a 77 y.o. female with medical history significant of morbid obesity, COPD, diastolic CHF, OSA on BiPAP presenting with acute diastolic CHF exacerbation, lower extremity weakness.   Has had marked lower extremity swelling over the past week or so with inability to ambulate secondary to weakness and pain.   OT comments  Pt demonstrated good progress today; she is more comfortable with movement and LE edema is much reduced. Pt reports mild pain in L UE. She is able to perform transfers, ambulation, grooming and dressing tasks in sitting, w/ Min-Mod A. Pt reports she is feeling close to her PLOF and is eager to return home. Provided educ re: medication mgmt, home safety, with pt and spouse verbalizing understanding. Pt will benefit from HHOT post DC.   Recommendations for follow up therapy are one component of a multi-disciplinary discharge planning process, led by the attending physician.  Recommendations may be updated based on patient status, additional functional criteria and insurance authorization.    Assistance Recommended at Discharge Frequent or constant Supervision/Assistance  Patient can return home with the following  Help with stairs or ramp for entrance;A little help with walking and/or transfers;A little help with bathing/dressing/bathroom;Assistance with cooking/housework;Assist for transportation   Equipment Recommendations  None recommended by OT    Recommendations for Other Services      Precautions / Restrictions Precautions Precautions: None Restrictions Weight Bearing Restrictions: No       Mobility Bed Mobility               General bed mobility comments: pt received, left in recliner    Transfers Overall transfer level: Needs assistance Equipment used: Rolling walker (2  wheels) Transfers: Sit to/from Stand Sit to Stand: Min assist, From elevated surface     Step pivot transfers: Min assist           Balance Overall balance assessment: Needs assistance Sitting-balance support: Bilateral upper extremity supported, Feet unsupported Sitting balance-Leahy Scale: Fair     Standing balance support: During functional activity, Bilateral upper extremity supported Standing balance-Leahy Scale: Good                             ADL either performed or assessed with clinical judgement   ADL Overall ADL's : Needs assistance/impaired     Grooming: Minimal assistance;Sitting               Lower Body Dressing: Moderate assistance                      Extremity/Trunk Assessment Upper Extremity Assessment Upper Extremity Assessment: Overall WFL for tasks assessed   Lower Extremity Assessment Lower Extremity Assessment: Generalized weakness   Cervical / Trunk Assessment Cervical / Trunk Assessment: Kyphotic    Vision       Perception     Praxis      Cognition Arousal/Alertness: Awake/alert Behavior During Therapy: WFL for tasks assessed/performed Overall Cognitive Status: Within Functional Limits for tasks assessed                                          Exercises Other Exercises Other Exercises: Educ re: medication mgmt, home safety    Shoulder  Instructions       General Comments      Pertinent Vitals/ Pain       Pain Assessment Pain Assessment: Faces Faces Pain Scale: Hurts a little bit Pain Location: L arm/wrist Pain Descriptors / Indicators: Aching Pain Intervention(s): Premedicated before session, Repositioned  Home Living                                          Prior Functioning/Environment              Frequency  Min 2X/week        Progress Toward Goals  OT Goals(current goals can now be found in the care plan section)  Progress towards OT  goals: Progressing toward goals  Acute Rehab OT Goals OT Goal Formulation: With patient/family Time For Goal Achievement: 09/10/22 Potential to Achieve Goals: Good  Plan Discharge plan remains appropriate;Frequency remains appropriate    Co-evaluation                 AM-PAC OT "6 Clicks" Daily Activity     Outcome Measure   Help from another person eating meals?: None Help from another person taking care of personal grooming?: A Little Help from another person toileting, which includes using toliet, bedpan, or urinal?: A Little Help from another person bathing (including washing, rinsing, drying)?: A Lot Help from another person to put on and taking off regular upper body clothing?: A Little Help from another person to put on and taking off regular lower body clothing?: A Lot 6 Click Score: 17    End of Session Equipment Utilized During Treatment: Rolling walker (2 wheels)  OT Visit Diagnosis: Other abnormalities of gait and mobility (R26.89);Muscle weakness (generalized) (M62.81)   Activity Tolerance Patient tolerated treatment well   Patient Left in chair;with nursing/sitter in room;with family/visitor present;with call bell/phone within reach   Nurse Communication          Time: 1050-1120 OT Time Calculation (min): 30 min  Charges: OT General Charges $OT Visit: 1 Visit OT Treatments $Self Care/Home Management : 23-37 mins Latina Craver, PhD, MS, OTR/L 08/29/22, 11:38 AM

## 2022-08-29 NOTE — Discharge Summary (Signed)
Physician Discharge Summary   Patient: Hannah Powell MRN: [id number] DOB: [birth year]  Admit date:     08/26/2022  Discharge date: 08/29/22  Discharge Physician: Arnetha Courser   PCP: Lynnea Ferrier, MD   Recommendations at discharge:  Please obtain CBC and BMP within a week Follow-up with cardiology Follow-up with neurosurgery Follow-up with PCP-please specify the need and duration of using prolonged courses of Bactrim and Zithromax. Follow-up with orthopedic surgery  Discharge Diagnoses: Principal Problem:   Acute on chronic diastolic CHF (congestive heart failure) (HCC) Active Problems:   Swelling of lower extremity   HTN (hypertension)   COPD (chronic obstructive pulmonary disease) (HCC)   Hypothyroidism   Cervical radiculopathy   Bilateral leg pain   Hospital Course: Taken from H&P and prior notes.   Hannah Powell is a 77 y.o. female with medical history significant of morbid obesity, COPD, diastolic CHF, OSA on BiPAP presenting with acute diastolic CHF exacerbation, lower extremity weakness.  Patient reports worsening lower extremity weakness and swelling over the past 2 weeks or so.  Minimal orthopnea and PND.  No chest pain.   Takes 80 mg p.o. Lasix twice a day. No missed doses. Patient does admit to eating some salty food. Also reports regular ibuprofen use for generalized pain.   ED course and data reviewed.  Hemodynamically stable, stable labs, BNP 190.  Chest x-ray with cardiomegaly and increased pulmonary vascular congestion.  Patient received 80 mg of IV Lasix in the ED. [facility] clinic cardiology was consulted as she was an established patient with them.  Repeat echocardiogram ordered.  6/10:Overnight patient became more hypotensive and mildly hypoxic requiring 2 L of oxygen.  Received 250 cc bolus x 2.  Lower extremity venous Doppler was negative for DVT.  Renal function stable with some improvement in BUN. Echocardiogram-done with pending results PT/OT  evaluation-recommending home health. Cardiology would like to continue IV diuresis for another day.  6/11: Blood pressure borderline soft, started sharpshooting pain starting from neck going down on left upper extremity with tingling and numbness involving ring and little finger.  Has a remote history of cervical fusion.  MRI of cervical spine and wrist ordered.  6/12: Vital stable with borderline soft blood pressure.  BMP with mild hypokalemia and creatinine started trending up at 1.07, IV Lasix has been switched to p.o. from tomorrow. MRI cervical spine with chronic changes and fusion and there was some concern of compression at C3-discussed with neurosurgery and according to them it should not cause pain in the upper extremity and they will see her as outpatient for further management. MRI of left wrist with severe osteoarthritis and tenosynovitis might be causing this severe pain. Patient has elevated TSH with normal free T4, likely needs some increase in her Synthroid which can be done by PCP.  Cardiology switched her home Lasix with torsemide.  Decrease the dose of spironolactone. She will continue on current medications and need to have a close follow-up with cardiology for further management.  Patient was found to be on Bactrim and Zithromax for longer durations, not sure why.  We did not continue during current hospitalization.  Those medications were continued on discharge and patient need to discuss with her provider who prescribed it about the importance of continuing that.  Patient will follow-up with neurosurgery and orthopedic surgery for cervical radiculopathy and concern of wrist severe arthritis.    Assessment and Plan: * Acute on chronic diastolic CHF (congestive heart failure) (HCC) Acute decompensated diastolic  heart failure exacerbation with noted significantly worsening lower extremity swelling and weakness over multiple weeks On high-dose diuretic with 80 mg twice daily  of Lasix at home Marked pitting edema in lower extremities bilaterally with mild venous stasis changes -significantly improved Suspect concomitant high-dose regular NSAID use is likely trigger BNP 190 Chest x-ray with cardiomegaly and vascular congestion Status post 80 mg of IV Lasix in ER with good urine output thus far Stressed NSAID avoidance -Cardiology is on board and would like to continue IV diuresis for another day -Repeat echocardiogram done-pending results -Continue with IV Lasix -Strict intake and output -Daily weight and BMP  Swelling of lower extremity Marked bilateral lower extremity swelling in the setting of baseline diastolic heart failure with high NSAID use-started improving Suspect dependent edema with associated volume overload Positive bilateral lower extremity redness Do not suspect component of infection or cellulitis at present as patient is afebrile, without white count-there may be an element of possible venous stasis.  Lower extremity venous Doppler was negative for DVT Diurese as appropriate Place TED hose Otherwise continue to monitor  HTN (hypertension) BP stable  Titrate home regimen    COPD (chronic obstructive pulmonary disease) (HCC) Stable from a resp standpoint  Cont home inhalers    Hypothyroidism TSH elevated at 7.082 with normal free T4. - continuing current dose of Synthroid-likely will need some increase in dose which can be done by PCP  Cervical radiculopathy Patient with history of cervical fusion developed overnight sharp shooting pain starting from neck going towards left upper extremity with tingling and numbness involving ring and little finger.  Symptoms more like involving ulnar nerve.  Symptoms worsened with any movements around wrist. MRI cervical spine and wrist ordered. Pain management Also added gabapentin Neurosurgery versus orthopedic consult depending on MRI results    Consultants: Cardiology Procedures performed:  None Disposition: Home health Diet recommendation:  Discharge Diet Orders (From admission, onward)     Start     Ordered   08/29/22 0000  Diet - low sodium heart healthy        08/29/22 1118           Cardiac diet DISCHARGE MEDICATION: Allergies as of 08/29/2022       Reactions   Lyrica [pregabalin] Other (See Comments)   Reaction: made Restless Leg Syndrome worse         Medication List     STOP taking these medications    furosemide 80 MG tablet Commonly known as: LASIX   ibuprofen 200 MG tablet Commonly known as: ADVIL   ibuprofen 800 MG tablet Commonly known as: ADVIL   levalbuterol 0.31 MG/3ML nebulizer solution Commonly known as: XOPENEX   losartan 25 MG tablet Commonly known as: COZAAR   oxybutynin 5 MG tablet Commonly known as: DITROPAN   Potassium Chloride ER 20 MEQ Tbcr       TAKE these medications    albuterol (2.5 MG/3ML) 0.083% nebulizer solution Commonly known as: PROVENTIL Take 2.5 mg by nebulization every 6 (six) hours as needed for wheezing or shortness of breath.   Ventolin HFA 108 (90 Base) MCG/ACT inhaler Generic drug: albuterol Inhale 2 puffs into the lungs every 6 (six) hours as needed.   ALPRAZolam 0.25 MG tablet Commonly known as: XANAX Take 1 tablet (0.25 mg total) by mouth 3 (three) times daily as needed for anxiety.   aspirin EC 81 MG tablet Take by mouth daily as needed.   azelastine 0.1 % nasal spray Commonly known as:  ASTELIN   azithromycin 250 MG tablet Commonly known as: ZITHROMAX Take 250 mg by mouth once. For 90 days   B-complex with vitamin C tablet Take 1 tablet by mouth daily.   Breo Ellipta 200-25 MCG/ACT Aepb Generic drug: fluticasone furoate-vilanterol Inhale 1 puff into the lungs daily.   calcium carbonate 1500 (600 Ca) MG Tabs tablet Commonly known as: OSCAL Take by mouth.   chlorpheniramine-HYDROcodone 10-8 MG/5ML Commonly known as: TUSSIONEX SMARTSIG:5 Milliliter(s) By Mouth Every 12  Hours PRN   estradiol 0.1 MG/GM vaginal cream Commonly known as: ESTRACE Insert 1 g into vagina nightly x 1 week, then continue using 1 g two (2) times a week. My use applicator or fingertip to insert.   ferrous sulfate 325 (65 FE) MG tablet Take 325 mg by mouth daily with breakfast.   gabapentin 300 MG capsule Commonly known as: NEURONTIN Take 1 capsule (300 mg total) by mouth 2 (two) times daily.   ipratropium-albuterol 0.5-2.5 (3) MG/3ML Soln Commonly known as: DUONEB Take 3 mLs by nebulization every 4 (four) hours as needed. What changed: Another medication with the same name was removed. Continue taking this medication, and follow the directions you see here.   ketoconazole 2 % cream Commonly known as: NIZORAL Apply once daily under the breast and left under arm.   levothyroxine 200 MCG tablet Commonly known as: SYNTHROID Take 250 mcg by mouth daily before breakfast.   loratadine-pseudoephedrine 10-240 MG 24 hr tablet Commonly known as: CLARITIN-D 24-hour Take 1 tablet by mouth daily as needed for allergies.   modafinil 100 MG tablet Commonly known as: PROVIGIL Take 100 mg by mouth daily.   MULTIVITAMIN WOMEN PO Take 1 tablet by mouth daily.   Myrbetriq 50 MG Tb24 tablet Generic drug: mirabegron ER Take 50 mg by mouth daily.   OMEGA 3 PO Take 1 capsule by mouth daily.   potassium chloride SA 20 MEQ tablet Commonly known as: KLOR-CON M Take 1 tablet (20 mEq total) by mouth daily. What changed: when to take this   pramipexole 1 MG tablet Commonly known as: MIRAPEX Take 2 tablets (2 mg total) by mouth at bedtime.   spironolactone 25 MG tablet Commonly known as: ALDACTONE Take 0.5 tablets (12.5 mg total) by mouth daily. Start taking on: August 30, 2022 What changed:  how much to take when to take this   sulfamethoxazole-trimethoprim 400-80 MG tablet Commonly known as: BACTRIM Take 1 tablet by mouth daily.   topiramate 100 MG tablet Commonly known as:  TOPAMAX Take 100 mg by mouth daily.   torsemide 20 MG tablet Commonly known as: DEMADEX Take 1 tablet (20 mg total) by mouth 2 (two) times daily.   traMADol 50 MG tablet Commonly known as: ULTRAM Take 1 tablet (50 mg total) by mouth every 8 (eight) hours as needed (for mild pain).   traZODone 50 MG tablet Commonly known as: DESYREL Take 1 tablet by mouth at bedtime as needed.   trospium 20 MG tablet Commonly known as: SANCTURA Take by mouth.   zolpidem 10 MG tablet Commonly known as: AMBIEN Take 5 mg by mouth at bedtime as needed for sleep.        Follow-up Information     Paraschos, Alexander, MD. Go in 1 week(s).   Specialty: Cardiology Contact information: [address] [facility] [place] [address] [phone]         Lynnea Ferrier, MD. Schedule an appointment as soon as possible for a visit  in 1 week(s).   Specialty: Internal Medicine Contact information: [address] [facility] [place] [address] [phone]         Venetia Night, MD. Schedule an appointment as soon as possible for a visit in 1 week(s).   Specialty: Neurosurgery Contact information: [address] [place] [address] [phone]                Discharge Exam: Ceasar Mons Weights   08/26/22 6962 08/29/22 0410  Weight: 104.3 kg 104.4 kg   General.  Obese elderly lady, in no acute distress. Pulmonary.  Lungs clear bilaterally, normal respiratory effort. CV.  Regular rate and rhythm, no JVD, rub or murmur. Abdomen.  Soft, nontender, nondistended, BS positive. CNS.  Alert and oriented .  No focal neurologic deficit. Extremities.  No edema,  pulses intact and symmetrical.  Signs of chronic venous congestion. Psychiatry.  Judgment and insight appears normal.   Condition at discharge: stable  The results of significant diagnostics from this hospitalization (including imaging, microbiology,  ancillary and laboratory) are listed below for reference.   Imaging Studies: MR WRIST LEFT WO CONTRAST  Result Date: 08/28/2022 CLINICAL DATA:  Ulnar nerve entrapment. Neck pain two fourth and fifth finger pain. EXAM: MR OF THE LEFT WRIST WITHOUT CONTRAST TECHNIQUE: Multiplanar, multisequence MR imaging of the left wrist was performed. No intravenous contrast was administered. COMPARISON:  Report only from left wrist radiographs 04/05/2020 describing widening of the scapholunate interval, moderate radiocarpal osteoarthritis, moderate first carpometacarpal osteoarthritis with lateral subluxation of the first metacarpal. Lucencies within the capitate, likely degenerative geodes. FINDINGS: Ligaments: March widening of the scapholunate interval. The lunate is not well identified and suspect marked attenuation and collapsed, possibly from chronic avascular necrosis. The scapholunate and lunotriquetral ligaments are chronically torn. Triangular fibrocartilage: The triangular fibrocartilage complex is not visualized, likely due to chronic high-grade ulnar-triquetrum osteoarthritis. Tendons: Mild third extensor compartment tenosynovitis distal to Lister's tubercle (axial series 16, image 20). Minimal fourth dorsal extensor compartment tenosynovitis. The flexor tendons and flexor retinaculum are intact. Carpal tunnel/median nerve: Normal carpal tunnel. Normal median nerve. Guyon's canal: Normal. Joint/cartilage: There is severe distal radioulnar joint space narrowing and peripheral osteophytosis. A 12 mm ossicle seen within the distal radioulnar joint (coronal image 17). There are multiple large distal radial subchondral cysts, with the largest felt to measuring approximately 14 mm and 13 mm, respectively. There is bone-on-bone contact of the radius with the scaphoid with the scaphoid causing approximately 4 mm depth erosion within the distal radius. There is proximal migration of the capitate consistent with  scapholunate advanced collapse wrist (SLAC wrist). 8 mm subchondral cyst within the proximal scaphoid. Severe triscaphe and thumb greater than index finger carpometacarpal osteoarthritis including joint space narrowing, subchondral cystic change/edema, peripheral osteophytosis. There is moderate lateral subluxation of the base of the thumb metacarpal with respect to the trapezium as described on the prior radiograph report. There is moderate likely degenerative marrow edema throughout the scaphoid, capitate, and lunate diffusely. Other: None. Note is made on 05/29/2013 left shoulder MRI there is similar shoulder joint effusion with synovitis, loose bodies, and destructive bone erosions. IMPRESSION: 1. Severe wrist osteoarthritis, including distal radioulnar, radiocarpal, triscaphe, and thumb greater than index finger carpometacarpal severe arthritis. This may be secondary to osteoarthritis or the proximal distribution inflammatory arthropathy rheumatoid arthritis. Recommend clinical correlation. 2. Nonvisualization of the lunate, likely due to chronic avascular necrosis. 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 Mild third extensor compartment tenosynovitis distal to Lister's tubercle. Minimal fourth dorsal extensor compartment tenosynovitis. 4. Moderate lateral subluxation of the base of the thumb metacarpal with respect to the trapezium as described on the prior radiograph report. Electronically Signed   By: Neita Garnet M.D.   On: 08/28/2022 15:51   MR CERVICAL SPINE W WO CONTRAST  Result Date: 08/28/2022 CLINICAL DATA:  Cervical radiculopathy. Previous cervical surgery. Ulnar nerve entrapment. EXAM: MRI CERVICAL SPINE WITHOUT AND WITH CONTRAST TECHNIQUE: Multiplanar and multiecho pulse sequences of the cervical spine, to include the craniocervical junction and cervicothoracic junction, were  obtained without and with intravenous contrast. CONTRAST:  9mL GADAVIST GADOBUTROL 1 MMOL/ML IV SOLN COMPARISON:  MRI 05/11/2014 FINDINGS: Alignment: No malalignment. Vertebrae: Distant ACDF from C3 through C7 with solid union. Cord: No primary cord lesion.  See below regarding stenosis at C2-3. Posterior Fossa, vertebral arteries, paraspinal tissues: Negative Disc levels: Foramen magnum and C1-2 are unremarkable. C2-3: Chronic disc degeneration and facet degeneration, worse on the left. Endplate osteophytes and bulging of the disc. Spinal stenosis with AP diameter of the canal in the midline 6.6 mm. Effacement of the subarachnoid space surrounding the cord but no frank cord compression. Foraminal narrowing that could possibly affect either C3 nerve, more likely on the left. Findings appear quite similar to the study of 2016. C3 through C7: Previous ACDF with solid union. Sufficient patency of the central canal. Bony foraminal narrowing on the left at C6-7 as seen chronically. C7-T1: Mild bulging of the disc. Mild facet osteoarthritis. No compressive stenosis. IMPRESSION: 1. Previous ACDF from C3 through C7 with solid union. Sufficient patency of the central canal. Chronic bony foraminal narrowing on the left at C6-7. 2. C2-3: Chronic degenerative spondylosis and facet arthropathy. Spinal stenosis with AP diameter of the canal in the midline 6.6 mm. Effacement of the subarachnoid space surrounding the cord but no frank cord compression. Foraminal narrowing left more than right that could possibly affect either C3 nerve, more likely on the left. Findings appear quite similar to the study of 2016. 3. C7-T1: Mild disc bulge and facet osteoarthritis. No compressive stenosis. Electronically Signed   By: Paulina Fusi M.D.   On: 08/28/2022 14:16   US Venous Img Lower Bilateral (DVT)  Result Date: 08/26/2022 CLINICAL DATA:  Lower extremity swelling EXAM: BILATERAL LOWER EXTREMITY VENOUS DOPPLER ULTRASOUND TECHNIQUE:  Gray-scale sonography with compression, as well as color and duplex ultrasound, were performed to evaluate the deep venous system(s) from the level of the common femoral vein through the popliteal and proximal calf veins. COMPARISON:  None Available. FINDINGS: VENOUS Normal compressibility of the common femoral, superficial femoral, and popliteal veins, as well as the visualized calf veins. Visualized portions of profunda femoral vein and great saphenous vein unremarkable. No filling defects to suggest DVT on grayscale or color Doppler imaging. Doppler waveforms show normal direction of venous flow, normal respiratory plasticity and response to augmentation. Limited views of the contralateral common femoral vein are unremarkable. OTHER None. Limitations: none IMPRESSION: Negative examination for deep venous thrombosis in the bilateral lower extremities. Electronically Signed   By: Jearld Lesch M.D.   On: 08/26/2022 13:01   DG Lumbar Spine 2-3 Views  Result Date: 08/26/2022 CLINICAL DATA:  Lower extremity weakness. Chronic diastolic heart failure. Progressive pain and difficulty ambulating. EXAM: LUMBAR SPINE - 2-3 VIEW COMPARISON:  Lumbar spine MRI from 06/26/2020 FINDINGS: Normal alignment. The vertebral body heights are well preserved. No  signs of acute fracture or subluxation. Moderate multilevel disc space narrowing and anterior and posterior spur formation. Facet arthropathy identified within the lower lumbar spine. Aortic atherosclerosis. Moderate stool burden noted within the colon. IMPRESSION: 1. No acute findings. 2. Moderate multilevel degenerative disc disease and facet arthropathy. Electronically Signed   By: Signa Kell M.D.   On: 08/26/2022 10:37   DG Chest Port 1 View  Result Date: 08/26/2022 CLINICAL DATA:  Weakness EXAM: PORTABLE CHEST 1 VIEW COMPARISON:  Prior chest x-ray 03/29/2022 FINDINGS: Stable cardiomegaly and pulmonary vascular congestion without overt edema. Linear airspace  opacities in the lung bases favored to reflect atelectasis. Lung volumes are low. No pleural effusion or pneumothorax. Bilateral shoulder arthroplasties and partially imaged cervical stabilization hardware. IMPRESSION: 1. Cardiomegaly with increased pulmonary vascular congestion but no overt edema. 2. Low lung volumes with bibasilar atelectasis. Electronically Signed   By: Malachy Moan M.D.   On: 08/26/2022 08:55    Microbiology: Results for orders placed or performed during the hospital encounter of 03/29/22  Resp panel by RT-PCR (RSV, Flu A&B, Covid) Anterior Nasal Swab     Status: None   Collection Time: 03/29/22  1:07 PM   Specimen: Anterior Nasal Swab  Result Value Ref Range Status   SARS Coronavirus 2 by RT PCR NEGATIVE NEGATIVE Final    Comment: (NOTE) SARS-CoV-2 target nucleic acids are NOT DETECTED.  The SARS-CoV-2 RNA is generally detectable in upper respiratory specimens during the acute phase of infection. The lowest concentration of SARS-CoV-2 viral copies this assay can detect is 138 copies/mL. A negative result does not preclude SARS-Cov-2 infection and should not be used as the sole basis for treatment or other patient management decisions. A negative result may occur with  improper specimen collection/handling, submission of specimen other than nasopharyngeal swab, presence of viral mutation(s) within the areas targeted by this assay, and inadequate number of viral copies(<138 copies/mL). A negative result must be combined with clinical observations, patient history, and epidemiological information. The expected result is Negative.  Fact Sheet for Patients:  BloggerCourse.com  Fact Sheet for Healthcare Providers:  SeriousBroker.it  This test is no t yet approved or cleared by the [place] FDA and  has been authorized for detection and/or diagnosis of SARS-CoV-2 by FDA under an Emergency Use Authorization  (EUA). This EUA will remain  in effect (meaning this test can be used) for the duration of the COVID-19 declaration under Section 564(b)(1) of the Act, 21 U.S.C.section 360bbb-3(b)(1), unless the authorization is terminated  or revoked sooner.       Influenza A by PCR NEGATIVE NEGATIVE Final   Influenza B by PCR NEGATIVE NEGATIVE Final    Comment: (NOTE) The Xpert Xpress SARS-CoV-2/FLU/RSV plus assay is intended as an aid in the diagnosis of influenza from Nasopharyngeal swab specimens and should not be used as a sole basis for treatment. Nasal washings and aspirates are unacceptable for Xpert Xpress SARS-CoV-2/FLU/RSV testing.  Fact Sheet for Patients: BloggerCourse.com  Fact Sheet for Healthcare Providers: SeriousBroker.it  This test is not yet approved or cleared by the [place] FDA and has been authorized for detection and/or diagnosis of SARS-CoV-2 by FDA under an Emergency Use Authorization (EUA). This EUA will remain in effect (meaning this test can be used) for the duration of the COVID-19 declaration under Section 564(b)(1) of the Act, 21 U.S.C. section 360bbb-3(b)(1), unless the authorization is terminated or revoked.     Resp Syncytial Virus by PCR NEGATIVE NEGATIVE Final  Comment: (NOTE) Fact Sheet for Patients: BloggerCourse.com  Fact Sheet for Healthcare Providers: SeriousBroker.it  This test is not yet approved or cleared by the [place] FDA and has been authorized for detection and/or diagnosis of SARS-CoV-2 by FDA under an Emergency Use Authorization (EUA). This EUA will remain in effect (meaning this test can be used) for the duration of the COVID-19 declaration under Section 564(b)(1) of the Act, 21 U.S.C. section 360bbb-3(b)(1), unless the authorization is terminated or revoked.  Performed at [facility], [address]  [place], [place] [address]   Culture, blood (routine x 2) Call MD if unable to obtain prior to antibiotics being given     Status: None   Collection Time: 03/29/22  3:19 PM   Specimen: BLOOD LEFT FOREARM  Result Value Ref Range Status   Specimen Description BLOOD LEFT FOREARM  Final   Special Requests   Final    BOTTLES DRAWN AEROBIC AND ANAEROBIC Blood Culture results may not be optimal due to an inadequate volume of blood received in culture bottles   Culture   Final    NO GROWTH 5 DAYS Performed at [facility], [address] [place], [place] [address]    Report Status 04/03/2022 FINAL  Final  Culture, blood (routine x 2) Call MD if unable to obtain prior to antibiotics being given     Status: None   Collection Time: 03/29/22  3:19 PM   Specimen: Left Antecubital; Blood  Result Value Ref Range Status   Specimen Description LEFT ANTECUBITAL  Final   Special Requests   Final    BOTTLES DRAWN AEROBIC AND ANAEROBIC Blood Culture adequate volume   Culture   Final    NO GROWTH 5 DAYS Performed at [facility], [address], [place], [place] [address]    Report Status 04/03/2022 FINAL  Final  MRSA Next Gen by PCR, Nasal     Status: None   Collection Time: 03/29/22  5:24 PM   Specimen: Nasal Mucosa; Nasal Swab  Result Value Ref Range Status   MRSA by PCR Next Gen NOT DETECTED NOT DETECTED Final    Comment: (NOTE) The GeneXpert MRSA Assay (FDA approved for NASAL specimens only), is one component of a comprehensive MRSA colonization surveillance program. It is not intended to diagnose MRSA infection nor to guide or monitor treatment for MRSA infections. Test performance is not FDA approved in patients less than 47 years old. Performed at [facility], [address] [place], [place] [address]     Labs: CBC: Recent Labs  Lab 08/26/22 0647 08/27/22 0115  WBC 6.1 5.5  NEUTROABS 4.2  --   HGB 13.7 12.2  HCT 44.6 40.2  MCV 98.5 99.3  PLT 188 183    Basic Metabolic Panel: Recent Labs  Lab 08/26/22 0647 08/27/22 0115 08/28/22 0538 08/29/22 0510  NA 142 140 139 134*  K 3.9 3.6 3.8 3.4*  CL 104 103 101 97*  CO2 30 29 30 29   GLUCOSE 93 103* 112* 131*  BUN 34* 29* 29* 34*  CREATININE 1.05* 1.00 0.94 1.07*  CALCIUM 9.1 8.3* 8.1* 8.3*  MG  --  2.5*  --   --    Liver Function Tests: Recent Labs  Lab 08/26/22 0647 08/27/22 0115  AST 24 20  ALT 13 9  ALKPHOS 103 83  BILITOT 0.2* 0.5  PROT 6.8 5.3*  ALBUMIN 4.0 3.2*   CBG: No results for input(s): "GLUCAP" in the last 168  hours.  Discharge time spent: greater than 30 minutes.  This record has been created using Conservation officer, historic buildings. Errors have been sought and corrected,but may not always be located. Such creation errors do not reflect on the standard of care.   Signed: Arnetha Courser, MD [facility] 08/29/2022

## 2022-08-29 NOTE — Progress Notes (Signed)
Mobility Specialist - Progress Note   Pre-mobility: SpO2(92) During mobility: SpO2(91) Post-mobility: SPO2(90)     08/29/22 1000  Mobility  Activity Ambulated with assistance in hallway  Level of Assistance Contact guard assist, steadying assist  Assistive Device Front wheel walker (Hemi-walker)  Distance Ambulated (ft) 170 ft  Range of Motion/Exercises Active  Activity Response Tolerated well  Mobility Referral Yes  $Mobility charge 1 Mobility  Mobility Specialist Start Time (ACUTE ONLY) 1003  Mobility Specialist Stop Time (ACUTE ONLY) 1050  Mobility Specialist Time Calculation (min) (ACUTE ONLY) 47 min   Pt resting in bed on RA upon entry. Pt STS and ambulates to hallway around NS CGA with Hemi-walker. Pt gait is slow but moderately stable. Pt returned to recliner and left with needs in reach. OT present at bedside upon exit.   Hannah Powell Mobility Specialist 08/29/22, 11:01 AM

## 2022-08-29 NOTE — TOC Transition Note (Signed)
Transition of Care [facility]) - CM/SW Discharge Note   Patient Details  Name: Hannah Powell MRN: [id number] Date of Birth: Aug 02, 1945  [facility] CM/SW Contact:  Truddie Hidden, RN Phone Number: 08/29/2022, 1:19 PM   Clinical Narrative:    Spoke with patient regarding discharge today.  Her husband will transport her home.  She was advised a representative from [facility]  would contact her to schedule a start of care.   TOC signing off.       Barriers to Discharge: Continued Medical Work up   Patient Goals and CMS Choice CMS Medicare.gov Compare Post Acute Care list provided to:: Patient Choice offered to / list presented to : Patient  Discharge Placement                         Discharge Plan and Services Additional resources added to the After Visit Summary for                            [facility] Arranged: PT HH Agency: [facility] Date [facility] Agency Contacted: 08/27/22 Time HH Agency Contacted: 1507 Representative spoke with at [facility] Agency: cory  Social Determinants of Health (SDOH) Interventions SDOH Screenings   Food Insecurity: No Food Insecurity (08/27/2022)  Housing: Low Risk  (08/27/2022)  Transportation Needs: No Transportation Needs (08/27/2022)  Utilities: Not At Risk (08/27/2022)  Depression (PHQ2-9): Low Risk  (01/10/2021)  Tobacco Use: Medium Risk (08/26/2022)     Readmission Risk Interventions     No data to display

## 2022-08-29 NOTE — Telephone Encounter (Signed)
Hospital called and her appt is scheduled for 09/05/2022 with Stacy.

## 2022-08-29 NOTE — Discharge Instructions (Addendum)

## 2022-08-29 NOTE — Telephone Encounter (Signed)
Hannah Courser, MD  Patient with prior history of cervical fusion, developed sudden onset sharp shooting pain starting from neck going down the left upper extremity and also tingling and numbness involving the ring and little finger, more like ulnar distribution. Repeat cervical spine MRI done today, also ordered wrist MRI as tingling and numbness was getting worse with wrist movements to exclude carpal tunnel, can you please review her MRI films and advise. She was crying with pain.She was initially admitted for fluid overload and plan was to discharge her today   Venetia Night MD the C spine MRI doesn't look too bad. C3 nerve root compression should not cause pain into the arm.we can see her in clinic to try to get to the bottom of it. Could be ulnar neuropathy, which we can see and try to get sorted. Schedule with Kennyth Arnold

## 2022-08-29 NOTE — Progress Notes (Signed)
PT Cancellation Note  Patient Details Name: Hannah Powell MRN: [id number] DOB: [birth year]   Cancelled Treatment:    Reason Eval/Treat Not Completed: Other (comment).  Chart reviewed and attempted to see pt.  Pt has already ambulated around the nursing station and is ready to be discharged.  Pt is at baseline level of function and will be discharged from PT at this time.   Nolon Bussing, PT, DPT Physical Therapist - [facility]  08/29/22, 11:58 AM

## 2022-08-29 NOTE — Assessment & Plan Note (Signed)
TSH elevated at 7.082 with normal free T4. - continuing current dose of Synthroid-likely will need some increase in dose which can be done by PCP

## 2022-08-29 NOTE — Progress Notes (Signed)
[facility] CARDIOLOGY CONSULT NOTE       Patient ID: Hannah Powell MRN: [id number] DOB/AGE: Jul 09, 1945 77 y.o.  Admit date: 08/26/2022 Referring Physician Dr. Doree Albee Primary Physician Dr. Daniel Nones Primary Cardiologist Dr. Gwen Pounds Reason for Consultation acute on chronic diastolic heart failure  HPI: Hannah Powell is a 77 y.o. female  with a past medical history of chronic HFpEF (>55%, g1DD 08/2021), COPD, OSA on BiPAP, chronic lower extremity edema, hypertension, hypothyroidism who presented to [facility] ED on 08/26/2022 for worsening lower extremity weakness and edema.  Cardiology was consulted for further assistance.   Interval History:  - feels overall better today. Some mild soreness in left forearm (ulnar side)  - peripheral edema significantly improved. No chest pain or dyspnea.  - eats out frequently at restaurants -[employer] Since Admission: -7,325 mL [08/29/22 1344]     Review of systems complete and found to be negative unless listed above    Past Medical History:  Diagnosis Date   Anxiety    Arthritis    CHF (congestive heart failure) (HCC)    COPD (chronic obstructive pulmonary disease) (HCC)    Dysplastic nevus 04/22/2019   R abdomen parallel to sup umbilicus - mod   Dysplastic nevus 04/22/2019   R low back above waistline - mild    Edema    feet/legs   Hypertension    dr Darryll Capers      Hypothyroidism    Leg weakness, bilateral    Osteoporosis    Oxygen deficit    2l with bipap at night   RLS (restless legs syndrome)    Shortness of breath    Sleep apnea    bipap   Wheezing     Past Surgical History:  Procedure Laterality Date   BACK SURGERY     cervical   BREAST BIOPSY Left    needle bx-neg   CATARACT EXTRACTION W/PHACO Left 05/07/2017   Procedure: CATARACT EXTRACTION PHACO AND INTRAOCULAR LENS PLACEMENT (IOC);  Surgeon: Galen Manila, MD;  Location: [facility] ORS;  Service: Ophthalmology;  Laterality: Left;  [place] 00:48.7 AP% 11.3 CDE  5.46 Fluid Pack Lot # [id number]   CATARACT EXTRACTION W/PHACO Right 04/23/2017   Procedure: CATARACT EXTRACTION PHACO AND INTRAOCULAR LENS PLACEMENT (IOC);  Surgeon: Galen Manila, MD;  Location: [facility] ORS;  Service: Ophthalmology;  Laterality: Right;  [place] 00:29 AP% 12.8 CDE 3.79 FLUID PACK LOT # [id number]   FOOT ARTHROPLASTY     JOINT REPLACEMENT     x2 tkr   TOTAL SHOULDER ARTHROPLASTY Left 07/02/2013   Procedure: LEFT TOTAL SHOULDER ARTHROPLASTY;  Surgeon: Senaida Lange, MD;  Location: [facility] OR;  Service: Orthopedics;  Laterality: Left;   TOTAL SHOULDER ARTHROPLASTY Right 04/10/2018   Procedure: TOTAL REVERSE SHOULDER ARTHROPLASTY;  Surgeon: Francena Hanly, MD;  Location: [facility] ORS;  Service: Orthopedics;  Laterality: Right;     Medications Prior to Admission  Medication Sig Dispense Refill Last Dose   albuterol (PROVENTIL) (2.5 MG/3ML) 0.083% nebulizer solution Take 2.5 mg by nebulization every 6 (six) hours as needed for wheezing or shortness of breath.   unk   ALPRAZolam (XANAX) 0.25 MG tablet Take 1 tablet (0.25 mg total) by mouth 3 (three) times daily as needed for anxiety. 30 tablet 0 unk   aspirin EC 81 MG tablet Take by mouth daily as needed.   08/25/2022   azelastine (ASTELIN) 0.1 % nasal spray    unk   azithromycin (ZITHROMAX) 250 MG tablet Take 250 mg  by mouth once. For 90 days   08/25/2022   B Complex-C (B-COMPLEX WITH VITAMIN C) tablet Take 1 tablet by mouth daily.    08/25/2022   calcium carbonate (OSCAL) 1500 (600 Ca) MG TABS tablet Take by mouth.   08/25/2022   chlorpheniramine-HYDROcodone (TUSSIONEX) 10-8 MG/5ML SMARTSIG:5 Milliliter(s) By Mouth Every 12 Hours PRN   unk   COMBIVENT RESPIMAT 20-100 MCG/ACT AERS respimat Inhale 1 puff into the lungs 4 (four) times daily.   08/25/2022   estradiol (ESTRACE) 0.1 MG/GM vaginal cream Insert 1 g into vagina nightly x 1 week, then continue using 1 g two (2) times a week. My use applicator or fingertip to insert.   Past Week   fluticasone  furoate-vilanterol (BREO ELLIPTA) 200-25 MCG/INH AEPB Inhale 1 puff into the lungs daily. 60 each 3 08/25/2022   furosemide (LASIX) 80 MG tablet Take 80 mg by mouth 2 (two) times daily.   08/24/2022   ibuprofen (ADVIL) 800 MG tablet Take by mouth.   unk   ibuprofen (ADVIL,MOTRIN) 200 MG tablet Take 800 mg by mouth every 6 (six) hours as needed for headache or mild pain.    unk   ipratropium-albuterol (DUONEB) 0.5-2.5 (3) MG/3ML SOLN Take 3 mLs by nebulization every 4 (four) hours as needed.   unk   ketoconazole (NIZORAL) 2 % cream Apply once daily under the breast and left under arm. 60 g 1 unk   levalbuterol (XOPENEX) 0.31 MG/3ML nebulizer solution Inhale into the lungs.   unk   levothyroxine (SYNTHROID, LEVOTHROID) 200 MCG tablet Take 250 mcg by mouth daily before breakfast.   08/25/2022   loratadine-pseudoephedrine (CLARITIN-D 24-HOUR) 10-240 MG 24 hr tablet Take 1 tablet by mouth daily as needed for allergies.   unk   MYRBETRIQ 50 MG TB24 tablet Take 50 mg by mouth daily.      spironolactone (ALDACTONE) 25 MG tablet Take 1 tablet by mouth 2 (two) times daily.   08/25/2022   sulfamethoxazole-trimethoprim (BACTRIM) 400-80 MG tablet Take 1 tablet by mouth daily.   08/25/2022   topiramate (TOPAMAX) 100 MG tablet Take 100 mg by mouth daily.   08/25/2022   traZODone (DESYREL) 50 MG tablet Take 1 tablet by mouth at bedtime as needed.   unk   trospium (SANCTURA) 20 MG tablet Take by mouth.   08/25/2022   VENTOLIN HFA 108 (90 Base) MCG/ACT inhaler Inhale 2 puffs into the lungs every 6 (six) hours as needed.   unk   [DISCONTINUED] potassium chloride SA (K-DUR,KLOR-CON) 20 MEQ tablet Take 20 mEq by mouth 2 (two) times daily.   08/25/2022   ferrous sulfate 325 (65 FE) MG tablet Take 325 mg by mouth daily with breakfast. (Patient not taking: Reported on 08/26/2022)   Not Taking   losartan (COZAAR) 25 MG tablet Take 25 mg by mouth daily. (Patient not taking: Reported on 07/10/2022)      modafinil (PROVIGIL) 100 MG tablet Take  100 mg by mouth daily.      Multiple Vitamins-Minerals (MULTIVITAMIN WOMEN PO) Take 1 tablet by mouth daily.      Omega-3 Fatty Acids (OMEGA 3 PO) Take 1 capsule by mouth daily.      oxybutynin (DITROPAN) 5 MG tablet Take 1 tablet by mouth 3 (three) times daily. (Patient not taking: Reported on 07/10/2022)   Not Taking   Potassium Chloride ER 20 MEQ TBCR Take by mouth. (Patient not taking: Reported on 08/26/2022)   Completed Course   pramipexole (MIRAPEX) 1 MG tablet Take  2 tablets (2 mg total) by mouth at bedtime.   08/24/2022   traMADol (ULTRAM) 50 MG tablet Take 1 tablet (50 mg total) by mouth every 8 (eight) hours as needed (for mild pain). (Patient not taking: Reported on 08/26/2022) 30 tablet 0 Completed Course   zolpidem (AMBIEN) 10 MG tablet Take 5 mg by mouth at bedtime as needed for sleep.   unk    Social History   Socioeconomic History   Marital status: Married    Spouse name: Not on file   Number of children: Not on file   Years of education: Not on file   Highest education level: Not on file  Occupational History   Not on file  Tobacco Use   Smoking status: Former    Packs/day: 1.00    Years: 10.00    Additional pack years: 0.00    Total pack years: 10.00    Types: Cigarettes    Quit date: 03/20/1975    Years since quitting: 47.4   Smokeless tobacco: Never  Vaping Use   Vaping Use: Never used  Substance and Sexual Activity   Alcohol use: No   Drug use: No   Sexual activity: Not on file  Other Topics Concern   Not on file  Social History Narrative   Not on file   Social Determinants of Health   Financial Resource Strain: Not on file  Food Insecurity: No Food Insecurity (08/27/2022)   Hunger Vital Sign    Worried About Running Out of Food in the Last Year: Never true    Ran Out of Food in the Last Year: Never true  Transportation Needs: No Transportation Needs (08/27/2022)   PRAPARE - Administrator, Civil Service (Medical): No    Lack of Transportation  (Non-Medical): No  Physical Activity: Not on file  Stress: Not on file  Social Connections: Not on file  Intimate Partner Violence: Not At Risk (08/27/2022)   Humiliation, Afraid, Rape, and Kick questionnaire    Fear of Current or Ex-Partner: No    Emotionally Abused: No    Physically Abused: No    Sexually Abused: No    Family History  Problem Relation Age of Onset   Hypertension Mother    Diabetes Mother    Heart failure Father    Breast cancer Neg Hx      Vitals:   08/29/22 0410 08/29/22 0725 08/29/22 0800 08/29/22 1115  BP: (!) 93/57 (!) 104/40  119/63  Pulse: 62 (!) 109 70 68  Resp: 18 20  18   Temp: (!) 97.5 F (36.4 C) 97.8 F (36.6 C)  97.7 F (36.5 C)  TempSrc: Tympanic     SpO2: 100% 96%  96%  Weight: 104.4 kg     Height:        PHYSICAL EXAM General: Elderly female, calm and comfortable, sitting in recliner with husband and OT at bedside HEENT: Normocephalic and atraumatic. Neck: No JVD.  Lungs: Normal respiratory effort on room air. No wheezes, rhonchi. Decreased breath sounds in bases, no appreciable crackles Heart: HRRR. Normal S1 and S2 without gallops or murmurs.  Abdomen: Non-distended appearing.  Msk: Normal strength and tone for age. Extremities:  trace erythema to both lower legs without significant tenderness to palpation. Skin dimpling/wrinkling without significant edema bilaterally, much improved compared to admission Neuro: Alert and oriented X 3. Psych: Answers questions appropriately.   Labs: Basic Metabolic Panel: Recent Labs    08/27/22 0115 08/28/22 0538 08/29/22 0510  NA 140 139 134*  K 3.6 3.8 3.4*  CL 103 101 97*  CO2 29 30 29   GLUCOSE 103* 112* 131*  BUN 29* 29* 34*  CREATININE 1.00 0.94 1.07*  CALCIUM 8.3* 8.1* 8.3*  MG 2.5*  --   --    Liver Function Tests: Recent Labs    08/27/22 0115  AST 20  ALT 9  ALKPHOS 83  BILITOT 0.5  PROT 5.3*  ALBUMIN 3.2*   No results for input(s): "LIPASE", "AMYLASE" in the last 72  hours. CBC: Recent Labs    08/27/22 0115  WBC 5.5  HGB 12.2  HCT 40.2  MCV 99.3  PLT 183   Cardiac Enzymes: No results for input(s): "CKTOTAL", "CKMB", "CKMBINDEX", "TROPONINIHS" in the last 72 hours. BNP: No results for input(s): "BNP" in the last 72 hours.  D-Dimer: No results for input(s): "DDIMER" in the last 72 hours. Hemoglobin A1C: No results for input(s): "HGBA1C" in the last 72 hours. Fasting Lipid Panel: No results for input(s): "CHOL", "HDL", "LDLCALC", "TRIG", "CHOLHDL", "LDLDIRECT" in the last 72 hours. Thyroid Function Tests: Recent Labs    08/27/22 0115  TSH 7.082*   Anemia Panel: No results for input(s): "VITAMINB12", "FOLATE", "FERRITIN", "TIBC", "IRON", "RETICCTPCT" in the last 72 hours.   Radiology: MR WRIST LEFT WO CONTRAST  Result Date: 08/28/2022 CLINICAL DATA:  Ulnar nerve entrapment. Neck pain two fourth and fifth finger pain. EXAM: MR OF THE LEFT WRIST WITHOUT CONTRAST TECHNIQUE: Multiplanar, multisequence MR imaging of the left wrist was performed. No intravenous contrast was administered. COMPARISON:  Report only from left wrist radiographs 04/05/2020 describing widening of the scapholunate interval, moderate radiocarpal osteoarthritis, moderate first carpometacarpal osteoarthritis with lateral subluxation of the first metacarpal. Lucencies within the capitate, likely degenerative geodes. FINDINGS: Ligaments: March widening of the scapholunate interval. The lunate is not well identified and suspect marked attenuation and collapsed, possibly from chronic avascular necrosis. The scapholunate and lunotriquetral ligaments are chronically torn. Triangular fibrocartilage: The triangular fibrocartilage complex is not visualized, likely due to chronic high-grade ulnar-triquetrum osteoarthritis. Tendons: Mild third extensor compartment tenosynovitis distal to Lister's tubercle (axial series 16, image 20). Minimal fourth dorsal extensor compartment tenosynovitis. The  flexor tendons and flexor retinaculum are intact. Carpal tunnel/median nerve: Normal carpal tunnel. Normal median nerve. Guyon's canal: Normal. Joint/cartilage: There is severe distal radioulnar joint space narrowing and peripheral osteophytosis. A 12 mm ossicle seen within the distal radioulnar joint (coronal image 17). There are multiple large distal radial subchondral cysts, with the largest felt to measuring approximately 14 mm and 13 mm, respectively. There is bone-on-bone contact of the radius with the scaphoid with the scaphoid causing approximately 4 mm depth erosion within the distal radius. There is proximal migration of the capitate consistent with scapholunate advanced collapse wrist (SLAC wrist). 8 mm subchondral cyst within the proximal scaphoid. Severe triscaphe and thumb greater than index finger carpometacarpal osteoarthritis including joint space narrowing, subchondral cystic change/edema, peripheral osteophytosis. There is moderate lateral subluxation of the base of the thumb metacarpal with respect to the trapezium as described on the prior radiograph report. There is moderate likely degenerative marrow edema throughout the scaphoid, capitate, and lunate diffusely. Other: None. Note is made on 05/29/2013 left shoulder MRI there is similar shoulder joint effusion with synovitis, loose bodies, and destructive bone erosions. IMPRESSION: 1. Severe wrist osteoarthritis, including distal radioulnar, radiocarpal, triscaphe, and thumb greater than index finger carpometacarpal severe arthritis. This may be secondary to osteoarthritis or the proximal distribution inflammatory arthropathy rheumatoid arthritis.  Recommend clinical correlation. 2. Nonvisualization of the lunate, likely due to chronic avascular necrosis. 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 Mild third extensor compartment  tenosynovitis distal to Lister's tubercle. Minimal fourth dorsal extensor compartment tenosynovitis. 4. Moderate lateral subluxation of the base of the thumb metacarpal with respect to the trapezium as described on the prior radiograph report. Electronically Signed   By: Neita Garnet M.D.   On: 08/28/2022 15:51   MR CERVICAL SPINE W WO CONTRAST  Result Date: 08/28/2022 CLINICAL DATA:  Cervical radiculopathy. Previous cervical surgery. Ulnar nerve entrapment. EXAM: MRI CERVICAL SPINE WITHOUT AND WITH CONTRAST TECHNIQUE: Multiplanar and multiecho pulse sequences of the cervical spine, to include the craniocervical junction and cervicothoracic junction, were obtained without and with intravenous contrast. CONTRAST:  9mL GADAVIST GADOBUTROL 1 MMOL/ML IV SOLN COMPARISON:  MRI 05/11/2014 FINDINGS: Alignment: No malalignment. Vertebrae: Distant ACDF from C3 through C7 with solid union. Cord: No primary cord lesion.  See below regarding stenosis at C2-3. Posterior Fossa, vertebral arteries, paraspinal tissues: Negative Disc levels: Foramen magnum and C1-2 are unremarkable. C2-3: Chronic disc degeneration and facet degeneration, worse on the left. Endplate osteophytes and bulging of the disc. Spinal stenosis with AP diameter of the canal in the midline 6.6 mm. Effacement of the subarachnoid space surrounding the cord but no frank cord compression. Foraminal narrowing that could possibly affect either C3 nerve, more likely on the left. Findings appear quite similar to the study of 2016. C3 through C7: Previous ACDF with solid union. Sufficient patency of the central canal. Bony foraminal narrowing on the left at C6-7 as seen chronically. C7-T1: Mild bulging of the disc. Mild facet osteoarthritis. No compressive stenosis. IMPRESSION: 1. Previous ACDF from C3 through C7 with solid union. Sufficient patency of the central canal. Chronic bony foraminal narrowing on the left at C6-7. 2. C2-3: Chronic degenerative spondylosis  and facet arthropathy. Spinal stenosis with AP diameter of the canal in the midline 6.6 mm. Effacement of the subarachnoid space surrounding the cord but no frank cord compression. Foraminal narrowing left more than right that could possibly affect either C3 nerve, more likely on the left. Findings appear quite similar to the study of 2016. 3. C7-T1: Mild disc bulge and facet osteoarthritis. No compressive stenosis. Electronically Signed   By: Paulina Fusi M.D.   On: 08/28/2022 14:16   US Venous Img Lower Bilateral (DVT)  Result Date: 08/26/2022 CLINICAL DATA:  Lower extremity swelling EXAM: BILATERAL LOWER EXTREMITY VENOUS DOPPLER ULTRASOUND TECHNIQUE: Gray-scale sonography with compression, as well as color and duplex ultrasound, were performed to evaluate the deep venous system(s) from the level of the common femoral vein through the popliteal and proximal calf veins. COMPARISON:  None Available. FINDINGS: VENOUS Normal compressibility of the common femoral, superficial femoral, and popliteal veins, as well as the visualized calf veins. Visualized portions of profunda femoral vein and great saphenous vein unremarkable. No filling defects to suggest DVT on grayscale or color Doppler imaging. Doppler waveforms show normal direction of venous flow, normal respiratory plasticity and response to augmentation. Limited views of the contralateral common femoral vein are unremarkable. OTHER None. Limitations: none IMPRESSION: Negative examination for deep venous thrombosis in the bilateral lower extremities. Electronically Signed   By: Jearld Lesch M.D.   On: 08/26/2022 13:01   DG Lumbar Spine 2-3 Views  Result Date: 08/26/2022 CLINICAL DATA:  Lower extremity weakness. Chronic diastolic heart failure. Progressive pain  and difficulty ambulating. EXAM: LUMBAR SPINE - 2-3 VIEW COMPARISON:  Lumbar spine MRI from 06/26/2020 FINDINGS: Normal alignment. The vertebral body heights are well preserved. No signs of acute  fracture or subluxation. Moderate multilevel disc space narrowing and anterior and posterior spur formation. Facet arthropathy identified within the lower lumbar spine. Aortic atherosclerosis. Moderate stool burden noted within the colon. IMPRESSION: 1. No acute findings. 2. Moderate multilevel degenerative disc disease and facet arthropathy. Electronically Signed   By: Signa Kell M.D.   On: 08/26/2022 10:37   DG Chest Port 1 View  Result Date: 08/26/2022 CLINICAL DATA:  Weakness EXAM: PORTABLE CHEST 1 VIEW COMPARISON:  Prior chest x-ray 03/29/2022 FINDINGS: Stable cardiomegaly and pulmonary vascular congestion without overt edema. Linear airspace opacities in the lung bases favored to reflect atelectasis. Lung volumes are low. No pleural effusion or pneumothorax. Bilateral shoulder arthroplasties and partially imaged cervical stabilization hardware. IMPRESSION: 1. Cardiomegaly with increased pulmonary vascular congestion but no overt edema. 2. Low lung volumes with bibasilar atelectasis. Electronically Signed   By: Malachy Moan M.D.   On: 08/26/2022 08:55    ECHO 09/13/2021:  ECHOCARDIOGRAPHIC MEASUREMENTS  2D DIMENSIONS  AORTA                  Values   Normal Range   MAIN PA         Values    Normal Range                Annulus: 1.9 cm       [2.1-2.5]         PA Main: nm*       [1.5-2.1]              Aorta Sin: 3.1 cm       [2.7-3.3]    RIGHT VENTRICLE            ST Junction: nm*          [2.3-2.9]         RV Base: nm*       [<4.2]             Asc.Aorta: 3.3 cm       [2.3-3.1]          RV Mid: 2.9 cm    [<3.5]  LEFT VENTRICLE                                      RV Length: nm*       [<8.6]                 LVIDd: 4.3 cm       [3.9-5.3]    INFERIOR VENA CAVA                  LVIDs: 3.1 cm                        Max. IVC: nm*       [<=2.1]                    FS: 28.3 %       [>25]            Min. IVC: nm*                    SWT: 1.1 cm       [  0.5-0.9]    ------------------                     PWT: 1.1 cm       [0.5-0.9]    nm* - not measured  LEFT ATRIUM                LA Diam: 4.0 cm       [2.7-3.8]            LA A4C Area: nm*          [<20]             LA Volume: nm*          [22-52]  _________________________________________________________________________________________  ECHOCARDIOGRAPHIC DESCRIPTIONS  AORTIC ROOT                   Size: Normal             Dissection: INDETERM FOR DISSECTION  AORTIC VALVE               Leaflets: Tricuspid                   Morphology: Normal               Mobility: Fully mobile  LEFT VENTRICLE                   Size: Normal                        Anterior: Normal            Contraction: Normal                         Lateral: Normal             Closest EF: >55% (Estimated)                Septal: Normal              LV Masses: No Masses                       Apical: Normal                    LVH: None                          Inferior: Normal                                                      Posterior: Normal           Dias.FxClass: (Grade 1) relaxation abnormal, E/A reversal  MITRAL VALVE               Leaflets: Normal                        Mobility: Fully mobile             Morphology: ANNULAR CALC  LEFT ATRIUM                   Size: Normal  LA Masses: No masses              IA Septum: Normal IAS  MAIN PA                   Size: Normal  PULMONIC VALVE             Morphology: Normal                        Mobility: Fully mobile  RIGHT VENTRICLE              RV Masses: No Masses                         Size: Normal              Free Wall: Normal                     Contraction: Normal  TRICUSPID VALVE               Leaflets: Normal                        Mobility: Fully mobile             Morphology: Normal  RIGHT ATRIUM                   Size: Normal                        RA Other: None                RA Mass: No masses  PERICARDIUM                 Fluid: No effusion  INFERIOR VENACAVA                    Size: Normal Normal respiratory collapse  _________________________________________________________________________________________   DOPPLER ECHO and OTHER SPECIAL PROCEDURES                 Aortic: No AR                      No AS                         143.0 cm/sec peak vel      8.2 mmHg peak grad                 Mitral: TRIVIAL MR                 No MS                         MV Inflow E Vel = 123.0 cm/sec      MV Annulus E'Vel = 7.6 cm/sec                         E/E'Ratio = 16.2              Tricuspid: TRIVIAL TR                 No TS              Pulmonary: MILD PR  No PS                         128.0 cm/sec peak vel      6.6 mmHg peak grad  _________________________________________________________________________________________  INTERPRETATION  NORMAL LEFT VENTRICULAR SYSTOLIC FUNCTION  NORMAL RIGHT VENTRICULAR SYSTOLIC FUNCTION  NO VALVULAR STENOSIS  TRIVIAL MR, TR  MILD PR  EF >55%   TELEMETRY reviewed by me (LT) 08/29/2022 : NSR PACs PVCs rate 60s-70s  EKG reviewed by me: sinus rhythm PACs PVC, reviewed by myself and Dr. Darrold Junker  Data reviewed by me (LT) 08/29/2022: last 24h vitals tele labs imaging I/O, admission H&P, ED provider note, nursing notes  Principal Problem:   Acute on chronic diastolic CHF (congestive heart failure) (HCC) Active Problems:   HTN (hypertension)   COPD (chronic obstructive pulmonary disease) (HCC)   Hypothyroidism   Swelling of lower extremity   Cervical radiculopathy   Bilateral leg pain    ASSESSMENT AND PLAN:  Marietta Sikkema is a 77 y.o. female  with a past medical history of chronic HFpEF (>55%, g1DD 08/2021), COPD, OSA on BiPAP, chronic lower extremity edema, hypertension, hypothyroidism who presented to [facility] ED on 08/26/2022 for worsening lower extremity weakness and edema.  Cardiology was consulted for further assistance.   # acute on chronic diastolic heart failure # lower extremity edema # leg  weakness / deconditioning Patient with worsening of lower extremity weakness and edema over the last week, had an episode 1 week ago where she was unable to stand up off the commode (one that was unusually low to the ground) - feels as though this really wore her out and that she's still recovering. She is without shortness of breath, CXR with some increased pulmonary vascular congestion on admission. Clinically greatly improved from a volume standpoint, excellent diuresis so far. Net neg 14lbs -stop further IV diuresis. Will change PO lasix to PO torsemide 20mg  BID at discharge. - continue spiro 12.5mg  PO daily  - GDMT with BB, ARB, [id number] limited by soft BP - continue to monitor I/Os -appreciate PT/OT assistance -Echo pending read - discussed low salt diet at length.   # neck pain, hx cervical ACDF # left wrist pain  With radiation down left arm with numbness/tingling in the ulnar distribution. MRI with stable C-spine post surgical changes. L wrist MRI with severe osteoarthritis. Multimodal pain mgmt per primary. Outpatient follow up with neurosurgery   # Hypertension BP controlled this AM. -restart spiro as above. Hold losartan with soft BPs -Monitor renal function closely -Monitor and replenish electrolytes for a goal K >4, Mag >2   # COPD # OSA on BiPAP Appears stable with no worsening shortness of breath, cough, dyspnea. -Continue home inhalers -Continue BiPAP nightly for OSA  # hypothyroidism - on levothyroxine daily.  -TSH uptrending to 7.082, free T4 WNL 0.78, BL from 04/03/22 was TSH 1.172 -recommend follow up with PCP   [facility] for discharge today from a cardiac perspective. Will arrange for follow up in clinic with Dr. Darrold Junker in 1-2 weeks.    This patient's plan of care was discussed and created with Dr. Darrold Junker and he is in agreement.  Signed: Rebeca Allegra, PA-C  08/29/2022, 1:44 PM [facility]

## 2022-08-30 ENCOUNTER — Ambulatory Visit: Payer: Self-pay

## 2022-08-30 NOTE — Telephone Encounter (Signed)
Message from Randol Kern sent at 08/30/2022 10:13 AM EDT  Summary: Questions about weight in ED   Pt wants to discuss a discrepancy in her weight between her ED visit on 08/26/2022 compared to today.  Best contact: [phone]         Chief Complaint: pt had question about weight Symptoms: "I feel good" Pertinent Negatives: Patient denies hand or feet swelling Disposition: [] ED /[] Urgent Care (no appt availability in office) / [] Appointment(In office/virtual)/ []  [place] [facility]/ [x] Home Care/ [] Refused Recommended Disposition /[] [place] [facility]/ []  Follow-up with PCP Additional Notes: advised pt that weight on 08/29/22 at ED was 230 lbs and pt stated her weight is 227 on home scale. Advised pt 3 lb weight loss (if scales were the same). No further questions or needs .  Reason for Disposition . Health Information question, no triage required and triager able to answer question  Answer Assessment - Initial Assessment Questions 1. REASON FOR CALL or QUESTION: "What is your reason for calling today?" or "How can I best help you?" or "What question do you have that I can help answer?"     Question about weight.  Protocols used: Information Only Call - No Triage-A-AH

## 2022-09-01 DIAGNOSIS — M1812 Unilateral primary osteoarthritis of first carpometacarpal joint, left hand: Secondary | ICD-10-CM | POA: Diagnosis not present

## 2022-09-01 DIAGNOSIS — J449 Chronic obstructive pulmonary disease, unspecified: Secondary | ICD-10-CM | POA: Diagnosis not present

## 2022-09-01 DIAGNOSIS — M40204 Unspecified kyphosis, thoracic region: Secondary | ICD-10-CM | POA: Diagnosis not present

## 2022-09-01 DIAGNOSIS — G4733 Obstructive sleep apnea (adult) (pediatric): Secondary | ICD-10-CM | POA: Diagnosis not present

## 2022-09-01 DIAGNOSIS — I5033 Acute on chronic diastolic (congestive) heart failure: Secondary | ICD-10-CM | POA: Diagnosis not present

## 2022-09-01 DIAGNOSIS — M5412 Radiculopathy, cervical region: Secondary | ICD-10-CM | POA: Diagnosis not present

## 2022-09-01 DIAGNOSIS — E039 Hypothyroidism, unspecified: Secondary | ICD-10-CM | POA: Diagnosis not present

## 2022-09-01 DIAGNOSIS — M19032 Primary osteoarthritis, left wrist: Secondary | ICD-10-CM | POA: Diagnosis not present

## 2022-09-01 DIAGNOSIS — I110 Hypertensive heart disease with heart failure: Secondary | ICD-10-CM | POA: Diagnosis not present

## 2022-09-03 DIAGNOSIS — M1812 Unilateral primary osteoarthritis of first carpometacarpal joint, left hand: Secondary | ICD-10-CM | POA: Diagnosis not present

## 2022-09-03 DIAGNOSIS — J449 Chronic obstructive pulmonary disease, unspecified: Secondary | ICD-10-CM | POA: Diagnosis not present

## 2022-09-03 DIAGNOSIS — I5033 Acute on chronic diastolic (congestive) heart failure: Secondary | ICD-10-CM | POA: Diagnosis not present

## 2022-09-03 DIAGNOSIS — E039 Hypothyroidism, unspecified: Secondary | ICD-10-CM | POA: Diagnosis not present

## 2022-09-03 DIAGNOSIS — M5412 Radiculopathy, cervical region: Secondary | ICD-10-CM | POA: Diagnosis not present

## 2022-09-03 DIAGNOSIS — I110 Hypertensive heart disease with heart failure: Secondary | ICD-10-CM | POA: Diagnosis not present

## 2022-09-03 DIAGNOSIS — M40204 Unspecified kyphosis, thoracic region: Secondary | ICD-10-CM | POA: Diagnosis not present

## 2022-09-03 DIAGNOSIS — G4733 Obstructive sleep apnea (adult) (pediatric): Secondary | ICD-10-CM | POA: Diagnosis not present

## 2022-09-03 DIAGNOSIS — M19032 Primary osteoarthritis, left wrist: Secondary | ICD-10-CM | POA: Diagnosis not present

## 2022-09-03 NOTE — Progress Notes (Unsigned)
Referring Physician:  Arnetha Courser, MD [address] [place],  [place] [address]  Primary Physician:  Lynnea Ferrier, MD  History of Present Illness: 09/05/2022 Ms. Hannah Powell has a history of CHF, HTN, COPD, hypothyroidism, and obesity.   Was admitted for CHF on 08/26/22 and worked up for neck and left arm pain. She has history of ACDF C3-C7. Also with known severe left wrist OA/tenosynovitis.  No current neck pain. She has intermittent pain in left wrist/forearm that is much better than it was in the hospital. No numbness, tingling in left hand. Some weakness in left hand.   Started on neurontin in the hospital- this made her feel loopy and she stopped it.   Bowel/Bladder Dysfunction: none  Conservative measures:  Physical therapy: none Multimodal medical therapy including regular antiinflammatories: neurontin, ultram  Injections: No epidural steroid injections  Past Surgery:  ACDF C3-C7 about 20 years ago  Hannah Powell has no symptoms of cervical myelopathy. History of chronic balance issues- this is not worse.   The symptoms are causing a significant impact on the patient's life.   Review of Systems:  A 10 point review of systems is negative, except for the pertinent positives and negatives detailed in the HPI.  Past Medical History: Past Medical History:  Diagnosis Date   Anxiety    Arthritis    CHF (congestive heart failure) (HCC)    COPD (chronic obstructive pulmonary disease) (HCC)    Dysplastic nevus 04/22/2019   R abdomen parallel to sup umbilicus - mod   Dysplastic nevus 04/22/2019   R low back above waistline - mild    Edema    feet/legs   Hypertension    dr Darryll Capers      Hypothyroidism    Leg weakness, bilateral    Osteoporosis    Oxygen deficit    2l with bipap at night   RLS (restless legs syndrome)    Shortness of breath    Sleep apnea    bipap   Wheezing     Past Surgical History: Past Surgical History:  Procedure  Laterality Date   BACK SURGERY     cervical   BREAST BIOPSY Left    needle bx-neg   CATARACT EXTRACTION W/PHACO Left 05/07/2017   Procedure: CATARACT EXTRACTION PHACO AND INTRAOCULAR LENS PLACEMENT (IOC);  Surgeon: Galen Manila, MD;  Location: [facility] ORS;  Service: Ophthalmology;  Laterality: Left;  [place] 00:48.7 AP% 11.3 CDE 5.46 Fluid Pack Lot # [id number]   CATARACT EXTRACTION W/PHACO Right 04/23/2017   Procedure: CATARACT EXTRACTION PHACO AND INTRAOCULAR LENS PLACEMENT (IOC);  Surgeon: Galen Manila, MD;  Location: [facility] ORS;  Service: Ophthalmology;  Laterality: Right;  [place] 00:29 AP% 12.8 CDE 3.79 FLUID PACK LOT # [id number]   FOOT ARTHROPLASTY     JOINT REPLACEMENT     x2 tkr   TOTAL SHOULDER ARTHROPLASTY Left 07/02/2013   Procedure: LEFT TOTAL SHOULDER ARTHROPLASTY;  Surgeon: Senaida Lange, MD;  Location: [facility];  Service: Orthopedics;  Laterality: Left;   TOTAL SHOULDER ARTHROPLASTY Right 04/10/2018   Procedure: TOTAL REVERSE SHOULDER ARTHROPLASTY;  Surgeon: Francena Hanly, MD;  Location: [facility];  Service: Orthopedics;  Laterality: Right;     Allergies: Allergies as of 09/05/2022 - Review Complete 08/26/2022  Allergen Reaction Noted   Lyrica [pregabalin] Other (See Comments) 07/18/2015    Medications: Outpatient Encounter Medications as of 09/05/2022  Medication Sig   albuterol (PROVENTIL) (2.5 MG/3ML) 0.083% nebulizer solution Take 2.5 mg by  nebulization every 6 (six) hours as needed for wheezing or shortness of breath.   ALPRAZolam (XANAX) 0.25 MG tablet Take 1 tablet (0.25 mg total) by mouth 3 (three) times daily as needed for anxiety.   aspirin EC 81 MG tablet Take by mouth daily as needed.   azelastine (ASTELIN) 0.1 % nasal spray    azithromycin (ZITHROMAX) 250 MG tablet Take 250 mg by mouth once. For 90 days   B Complex-C (B-COMPLEX WITH VITAMIN C) tablet Take 1 tablet by mouth daily.    calcium carbonate (OSCAL) 1500 (600 Ca) MG TABS tablet Take by mouth.    chlorpheniramine-HYDROcodone (TUSSIONEX) 10-8 MG/5ML SMARTSIG:5 Milliliter(s) By Mouth Every 12 Hours PRN   estradiol (ESTRACE) 0.1 MG/GM vaginal cream Insert 1 g into vagina nightly x 1 week, then continue using 1 g two (2) times a week. My use applicator or fingertip to insert.   ferrous sulfate 325 (65 FE) MG tablet Take 325 mg by mouth daily with breakfast. (Patient not taking: Reported on 08/26/2022)   fluticasone furoate-vilanterol (BREO ELLIPTA) 200-25 MCG/INH AEPB Inhale 1 puff into the lungs daily.   gabapentin (NEURONTIN) 300 MG capsule Take 1 capsule (300 mg total) by mouth 2 (two) times daily.   ipratropium-albuterol (DUONEB) 0.5-2.5 (3) MG/3ML SOLN Take 3 mLs by nebulization every 4 (four) hours as needed.   ketoconazole (NIZORAL) 2 % cream Apply once daily under the breast and left under arm.   levothyroxine (SYNTHROID, LEVOTHROID) 200 MCG tablet Take 250 mcg by mouth daily before breakfast.   loratadine-pseudoephedrine (CLARITIN-D 24-HOUR) 10-240 MG 24 hr tablet Take 1 tablet by mouth daily as needed for allergies.   modafinil (PROVIGIL) 100 MG tablet Take 100 mg by mouth daily.   Multiple Vitamins-Minerals (MULTIVITAMIN WOMEN PO) Take 1 tablet by mouth daily.   MYRBETRIQ 50 MG TB24 tablet Take 50 mg by mouth daily.   Omega-3 Fatty Acids (OMEGA 3 PO) Take 1 capsule by mouth daily.   potassium chloride SA (KLOR-CON M) 20 MEQ tablet Take 1 tablet (20 mEq total) by mouth daily.   pramipexole (MIRAPEX) 1 MG tablet Take 2 tablets (2 mg total) by mouth at bedtime.   spironolactone (ALDACTONE) 25 MG tablet Take 0.5 tablets (12.5 mg total) by mouth daily.   sulfamethoxazole-trimethoprim (BACTRIM) 400-80 MG tablet Take 1 tablet by mouth daily.   topiramate (TOPAMAX) 100 MG tablet Take 100 mg by mouth daily.   torsemide (DEMADEX) 20 MG tablet Take 1 tablet (20 mg total) by mouth 2 (two) times daily.   traMADol (ULTRAM) 50 MG tablet Take 1 tablet (50 mg total) by mouth every 8 (eight) hours as  needed (for mild pain).   traZODone (DESYREL) 50 MG tablet Take 1 tablet by mouth at bedtime as needed.   trospium (SANCTURA) 20 MG tablet Take by mouth.   VENTOLIN HFA 108 (90 Base) MCG/ACT inhaler Inhale 2 puffs into the lungs every 6 (six) hours as needed.   zolpidem (AMBIEN) 10 MG tablet Take 5 mg by mouth at bedtime as needed for sleep.   No facility-administered encounter medications on file as of 09/05/2022.    Social History: Social History   Tobacco Use   Smoking status: Former    Packs/day: 1.00    Years: 10.00    Additional pack years: 0.00    Total pack years: 10.00    Types: Cigarettes    Quit date: 03/20/1975    Years since quitting: 47.4   Smokeless tobacco: Never  Vaping Use  Vaping Use: Never used  Substance Use Topics   Alcohol use: No   Drug use: No    Family Medical History: Family History  Problem Relation Age of Onset   Hypertension Mother    Diabetes Mother    Heart failure Father    Breast cancer Neg Hx     Physical Examination: Vitals:   09/05/22 1104  BP: 122/71    General: Patient is well developed, well nourished, calm, collected, and in no apparent distress. Attention to examination is appropriate.  Respiratory: Patient is breathing without any difficulty.   NEUROLOGICAL:     Awake, alert, oriented to person, place, and time.  Speech is clear and fluent. Fund of knowledge is appropriate.   Cranial Nerves: Pupils equal round and reactive to light.  Facial tone is symmetric.    No abnormal lesions on exposed skin.   Strength: Side Biceps Triceps Deltoid Interossei Grip Wrist Ext. Wrist Flex.  R 5 5 5 5 5 5 5   L 5 5 5 5 5 5 5    Side Iliopsoas Quads Hamstring PF DF EHL  R 5 5 5 5 5 5   L 5 5 5 5 5 5    Reflexes are 1+ and symmetric at the biceps, triceps, brachioradialis, patella and achilles.   Hoffman's is absent.  Clonus is not present.   Bilateral upper and lower extremity sensation is intact to light touch.     She has  swelling in both legs that she states is chronic.   Gait is not tested. She is in a WC.   Medical Decision Making  Imaging: MRI of cervical spine dated 08/28/22:  FINDINGS: Alignment: No malalignment.   Vertebrae: Distant ACDF from C3 through C7 with solid union.   Cord: No primary cord lesion.  See below regarding stenosis at C2-3.   Posterior Fossa, vertebral arteries, paraspinal tissues: Negative   Disc levels:   Foramen magnum and C1-2 are unremarkable.   C2-3: Chronic disc degeneration and facet degeneration, worse on the left. Endplate osteophytes and bulging of the disc. Spinal stenosis with AP diameter of the canal in the midline 6.6 mm. Effacement of the subarachnoid space surrounding the cord but no frank cord compression. Foraminal narrowing that could possibly affect either C3 nerve, more likely on the left. Findings appear quite similar to the study of 2016.   C3 through C7: Previous ACDF with solid union. Sufficient patency of the central canal. Bony foraminal narrowing on the left at C6-7 as seen chronically.   C7-T1: Mild bulging of the disc. Mild facet osteoarthritis. No compressive stenosis.   IMPRESSION: 1. Previous ACDF from C3 through C7 with solid union. Sufficient patency of the central canal. Chronic bony foraminal narrowing on the left at C6-7. 2. C2-3: Chronic degenerative spondylosis and facet arthropathy. Spinal stenosis with AP diameter of the canal in the midline 6.6 mm. Effacement of the subarachnoid space surrounding the cord but no frank cord compression. Foraminal narrowing left more than right that could possibly affect either C3 nerve, more likely on the left. Findings appear quite similar to the study of 2016. 3. C7-T1: Mild disc bulge and facet osteoarthritis. No compressive stenosis.     Electronically Signed   By: Paulina Fusi M.D.   On: 08/28/2022 14:16  I have personally reviewed the images and agree with the above  interpretation.  Assessment and Plan: Ms. Dinsmore is a pleasant 77 y.o. female was admitted for CHF on 08/26/22 and worked up for neck  and left arm pain. She has history of ACDF C3-C7. Also with known severe left wrist OA/tenosynovitis.  She is feeling much better since she was discharged. She has no current neck pain. She has intermittent pain in left wrist/forearm that is tolerable.   She has very mild central stenosis with bilateral foraminal stenosis at C2-C3 along with spondylosos at C7-T1. This would not cause left arm pain. No signs of symptoms of cervical myelopathy.   Treatment options discussed with patient and following plan made:   - No further treatment recommended for her cervical spine.  - She is established patient at [facility] for her wrist. She will f/u with them prn. For now, her pain is tolerable.  - She will f/u with me prn as well.   I spent a total of 25 minutes in face-to-face and non-face-to-face activities related to this patient's care today including review of outside records, review of imaging, review of symptoms, physical exam, discussion of differential diagnosis, discussion of treatment options, and documentation.   Thank you for involving me in the care of this patient.   Drake Leach PA-C Dept. of Neurosurgery

## 2022-09-05 ENCOUNTER — Encounter: Payer: Self-pay | Admitting: Orthopedic Surgery

## 2022-09-05 ENCOUNTER — Ambulatory Visit (INDEPENDENT_AMBULATORY_CARE_PROVIDER_SITE_OTHER): Payer: Medicare HMO | Admitting: Orthopedic Surgery

## 2022-09-05 VITALS — BP 122/71 | Ht 66.0 in | Wt 226.0 lb

## 2022-09-05 DIAGNOSIS — M47812 Spondylosis without myelopathy or radiculopathy, cervical region: Secondary | ICD-10-CM | POA: Diagnosis not present

## 2022-09-05 DIAGNOSIS — Z981 Arthrodesis status: Secondary | ICD-10-CM

## 2022-09-05 DIAGNOSIS — M1812 Unilateral primary osteoarthritis of first carpometacarpal joint, left hand: Secondary | ICD-10-CM | POA: Diagnosis not present

## 2022-09-05 DIAGNOSIS — M40204 Unspecified kyphosis, thoracic region: Secondary | ICD-10-CM | POA: Diagnosis not present

## 2022-09-05 DIAGNOSIS — M5412 Radiculopathy, cervical region: Secondary | ICD-10-CM | POA: Diagnosis not present

## 2022-09-05 DIAGNOSIS — I110 Hypertensive heart disease with heart failure: Secondary | ICD-10-CM | POA: Diagnosis not present

## 2022-09-05 DIAGNOSIS — M25532 Pain in left wrist: Secondary | ICD-10-CM | POA: Diagnosis not present

## 2022-09-05 DIAGNOSIS — E039 Hypothyroidism, unspecified: Secondary | ICD-10-CM | POA: Diagnosis not present

## 2022-09-05 DIAGNOSIS — G4733 Obstructive sleep apnea (adult) (pediatric): Secondary | ICD-10-CM | POA: Diagnosis not present

## 2022-09-05 DIAGNOSIS — I5033 Acute on chronic diastolic (congestive) heart failure: Secondary | ICD-10-CM | POA: Diagnosis not present

## 2022-09-05 DIAGNOSIS — J449 Chronic obstructive pulmonary disease, unspecified: Secondary | ICD-10-CM | POA: Diagnosis not present

## 2022-09-05 DIAGNOSIS — M19032 Primary osteoarthritis, left wrist: Secondary | ICD-10-CM | POA: Diagnosis not present

## 2022-09-06 DIAGNOSIS — R238 Other skin changes: Secondary | ICD-10-CM | POA: Diagnosis not present

## 2022-09-06 DIAGNOSIS — I509 Heart failure, unspecified: Secondary | ICD-10-CM | POA: Diagnosis not present

## 2022-09-06 DIAGNOSIS — J449 Chronic obstructive pulmonary disease, unspecified: Secondary | ICD-10-CM | POA: Diagnosis not present

## 2022-09-06 DIAGNOSIS — M5412 Radiculopathy, cervical region: Secondary | ICD-10-CM | POA: Diagnosis not present

## 2022-09-06 DIAGNOSIS — M7989 Other specified soft tissue disorders: Secondary | ICD-10-CM | POA: Diagnosis not present

## 2022-09-06 DIAGNOSIS — M40204 Unspecified kyphosis, thoracic region: Secondary | ICD-10-CM | POA: Diagnosis not present

## 2022-09-06 DIAGNOSIS — I5033 Acute on chronic diastolic (congestive) heart failure: Secondary | ICD-10-CM | POA: Diagnosis not present

## 2022-09-06 DIAGNOSIS — G4733 Obstructive sleep apnea (adult) (pediatric): Secondary | ICD-10-CM | POA: Diagnosis not present

## 2022-09-06 DIAGNOSIS — I110 Hypertensive heart disease with heart failure: Secondary | ICD-10-CM | POA: Diagnosis not present

## 2022-09-06 DIAGNOSIS — M19032 Primary osteoarthritis, left wrist: Secondary | ICD-10-CM | POA: Diagnosis not present

## 2022-09-06 DIAGNOSIS — M1812 Unilateral primary osteoarthritis of first carpometacarpal joint, left hand: Secondary | ICD-10-CM | POA: Diagnosis not present

## 2022-09-06 DIAGNOSIS — E039 Hypothyroidism, unspecified: Secondary | ICD-10-CM | POA: Diagnosis not present

## 2022-09-07 DIAGNOSIS — I5033 Acute on chronic diastolic (congestive) heart failure: Secondary | ICD-10-CM | POA: Diagnosis not present

## 2022-09-07 DIAGNOSIS — J449 Chronic obstructive pulmonary disease, unspecified: Secondary | ICD-10-CM | POA: Diagnosis not present

## 2022-09-07 DIAGNOSIS — M40204 Unspecified kyphosis, thoracic region: Secondary | ICD-10-CM | POA: Diagnosis not present

## 2022-09-07 DIAGNOSIS — E039 Hypothyroidism, unspecified: Secondary | ICD-10-CM | POA: Diagnosis not present

## 2022-09-07 DIAGNOSIS — M5412 Radiculopathy, cervical region: Secondary | ICD-10-CM | POA: Diagnosis not present

## 2022-09-07 DIAGNOSIS — G4733 Obstructive sleep apnea (adult) (pediatric): Secondary | ICD-10-CM | POA: Diagnosis not present

## 2022-09-07 DIAGNOSIS — M19032 Primary osteoarthritis, left wrist: Secondary | ICD-10-CM | POA: Diagnosis not present

## 2022-09-07 DIAGNOSIS — I110 Hypertensive heart disease with heart failure: Secondary | ICD-10-CM | POA: Diagnosis not present

## 2022-09-07 DIAGNOSIS — M1812 Unilateral primary osteoarthritis of first carpometacarpal joint, left hand: Secondary | ICD-10-CM | POA: Diagnosis not present

## 2022-09-10 DIAGNOSIS — M40204 Unspecified kyphosis, thoracic region: Secondary | ICD-10-CM | POA: Diagnosis not present

## 2022-09-10 DIAGNOSIS — J449 Chronic obstructive pulmonary disease, unspecified: Secondary | ICD-10-CM | POA: Diagnosis not present

## 2022-09-10 DIAGNOSIS — I5033 Acute on chronic diastolic (congestive) heart failure: Secondary | ICD-10-CM | POA: Diagnosis not present

## 2022-09-10 DIAGNOSIS — M1812 Unilateral primary osteoarthritis of first carpometacarpal joint, left hand: Secondary | ICD-10-CM | POA: Diagnosis not present

## 2022-09-10 DIAGNOSIS — M19032 Primary osteoarthritis, left wrist: Secondary | ICD-10-CM | POA: Diagnosis not present

## 2022-09-10 DIAGNOSIS — G4733 Obstructive sleep apnea (adult) (pediatric): Secondary | ICD-10-CM | POA: Diagnosis not present

## 2022-09-10 DIAGNOSIS — E039 Hypothyroidism, unspecified: Secondary | ICD-10-CM | POA: Diagnosis not present

## 2022-09-10 DIAGNOSIS — M5412 Radiculopathy, cervical region: Secondary | ICD-10-CM | POA: Diagnosis not present

## 2022-09-10 DIAGNOSIS — I110 Hypertensive heart disease with heart failure: Secondary | ICD-10-CM | POA: Diagnosis not present

## 2022-09-11 DIAGNOSIS — M40204 Unspecified kyphosis, thoracic region: Secondary | ICD-10-CM | POA: Diagnosis not present

## 2022-09-11 DIAGNOSIS — J449 Chronic obstructive pulmonary disease, unspecified: Secondary | ICD-10-CM | POA: Diagnosis not present

## 2022-09-11 DIAGNOSIS — M5412 Radiculopathy, cervical region: Secondary | ICD-10-CM | POA: Diagnosis not present

## 2022-09-11 DIAGNOSIS — I110 Hypertensive heart disease with heart failure: Secondary | ICD-10-CM | POA: Diagnosis not present

## 2022-09-11 DIAGNOSIS — E039 Hypothyroidism, unspecified: Secondary | ICD-10-CM | POA: Diagnosis not present

## 2022-09-11 DIAGNOSIS — I5033 Acute on chronic diastolic (congestive) heart failure: Secondary | ICD-10-CM | POA: Diagnosis not present

## 2022-09-11 DIAGNOSIS — G4733 Obstructive sleep apnea (adult) (pediatric): Secondary | ICD-10-CM | POA: Diagnosis not present

## 2022-09-12 DIAGNOSIS — R238 Other skin changes: Secondary | ICD-10-CM | POA: Diagnosis not present

## 2022-09-12 DIAGNOSIS — I5032 Chronic diastolic (congestive) heart failure: Secondary | ICD-10-CM | POA: Diagnosis not present

## 2022-09-12 DIAGNOSIS — M7989 Other specified soft tissue disorders: Secondary | ICD-10-CM | POA: Diagnosis not present

## 2022-09-12 DIAGNOSIS — R2243 Localized swelling, mass and lump, lower limb, bilateral: Secondary | ICD-10-CM | POA: Diagnosis not present

## 2022-09-12 DIAGNOSIS — I10 Essential (primary) hypertension: Secondary | ICD-10-CM | POA: Diagnosis not present

## 2022-09-12 DIAGNOSIS — E034 Atrophy of thyroid (acquired): Secondary | ICD-10-CM | POA: Diagnosis not present

## 2022-09-13 ENCOUNTER — Encounter: Payer: Medicare HMO | Admitting: Family

## 2022-09-13 DIAGNOSIS — I5033 Acute on chronic diastolic (congestive) heart failure: Secondary | ICD-10-CM | POA: Diagnosis not present

## 2022-09-13 DIAGNOSIS — M40204 Unspecified kyphosis, thoracic region: Secondary | ICD-10-CM | POA: Diagnosis not present

## 2022-09-13 DIAGNOSIS — E039 Hypothyroidism, unspecified: Secondary | ICD-10-CM | POA: Diagnosis not present

## 2022-09-13 DIAGNOSIS — M1812 Unilateral primary osteoarthritis of first carpometacarpal joint, left hand: Secondary | ICD-10-CM | POA: Diagnosis not present

## 2022-09-13 DIAGNOSIS — M19032 Primary osteoarthritis, left wrist: Secondary | ICD-10-CM | POA: Diagnosis not present

## 2022-09-13 DIAGNOSIS — J449 Chronic obstructive pulmonary disease, unspecified: Secondary | ICD-10-CM | POA: Diagnosis not present

## 2022-09-13 DIAGNOSIS — I110 Hypertensive heart disease with heart failure: Secondary | ICD-10-CM | POA: Diagnosis not present

## 2022-09-13 DIAGNOSIS — M5412 Radiculopathy, cervical region: Secondary | ICD-10-CM | POA: Diagnosis not present

## 2022-09-13 DIAGNOSIS — G4733 Obstructive sleep apnea (adult) (pediatric): Secondary | ICD-10-CM | POA: Diagnosis not present

## 2022-09-14 DIAGNOSIS — I509 Heart failure, unspecified: Secondary | ICD-10-CM | POA: Diagnosis not present

## 2022-09-14 DIAGNOSIS — N1831 Chronic kidney disease, stage 3a: Secondary | ICD-10-CM | POA: Diagnosis not present

## 2022-09-14 DIAGNOSIS — I10 Essential (primary) hypertension: Secondary | ICD-10-CM | POA: Diagnosis not present

## 2022-09-14 DIAGNOSIS — J438 Other emphysema: Secondary | ICD-10-CM | POA: Diagnosis not present

## 2022-09-14 DIAGNOSIS — I5032 Chronic diastolic (congestive) heart failure: Secondary | ICD-10-CM | POA: Diagnosis not present

## 2022-09-14 DIAGNOSIS — E7800 Pure hypercholesterolemia, unspecified: Secondary | ICD-10-CM | POA: Diagnosis not present

## 2022-09-17 DIAGNOSIS — M19032 Primary osteoarthritis, left wrist: Secondary | ICD-10-CM | POA: Diagnosis not present

## 2022-09-17 DIAGNOSIS — I110 Hypertensive heart disease with heart failure: Secondary | ICD-10-CM | POA: Diagnosis not present

## 2022-09-17 DIAGNOSIS — M40204 Unspecified kyphosis, thoracic region: Secondary | ICD-10-CM | POA: Diagnosis not present

## 2022-09-17 DIAGNOSIS — E039 Hypothyroidism, unspecified: Secondary | ICD-10-CM | POA: Diagnosis not present

## 2022-09-17 DIAGNOSIS — J449 Chronic obstructive pulmonary disease, unspecified: Secondary | ICD-10-CM | POA: Diagnosis not present

## 2022-09-17 DIAGNOSIS — I5033 Acute on chronic diastolic (congestive) heart failure: Secondary | ICD-10-CM | POA: Diagnosis not present

## 2022-09-17 DIAGNOSIS — M1812 Unilateral primary osteoarthritis of first carpometacarpal joint, left hand: Secondary | ICD-10-CM | POA: Diagnosis not present

## 2022-09-17 DIAGNOSIS — G4733 Obstructive sleep apnea (adult) (pediatric): Secondary | ICD-10-CM | POA: Diagnosis not present

## 2022-09-17 DIAGNOSIS — M5412 Radiculopathy, cervical region: Secondary | ICD-10-CM | POA: Diagnosis not present

## 2022-09-19 DIAGNOSIS — R2243 Localized swelling, mass and lump, lower limb, bilateral: Secondary | ICD-10-CM | POA: Diagnosis not present

## 2022-09-26 DIAGNOSIS — M1812 Unilateral primary osteoarthritis of first carpometacarpal joint, left hand: Secondary | ICD-10-CM | POA: Diagnosis not present

## 2022-09-26 DIAGNOSIS — M5412 Radiculopathy, cervical region: Secondary | ICD-10-CM | POA: Diagnosis not present

## 2022-09-26 DIAGNOSIS — Z96652 Presence of left artificial knee joint: Secondary | ICD-10-CM | POA: Diagnosis not present

## 2022-09-26 DIAGNOSIS — J9611 Chronic respiratory failure with hypoxia: Secondary | ICD-10-CM | POA: Diagnosis not present

## 2022-09-26 DIAGNOSIS — I110 Hypertensive heart disease with heart failure: Secondary | ICD-10-CM | POA: Diagnosis not present

## 2022-09-26 DIAGNOSIS — G63 Polyneuropathy in diseases classified elsewhere: Secondary | ICD-10-CM | POA: Diagnosis not present

## 2022-09-26 DIAGNOSIS — M19032 Primary osteoarthritis, left wrist: Secondary | ICD-10-CM | POA: Diagnosis not present

## 2022-09-26 DIAGNOSIS — I130 Hypertensive heart and chronic kidney disease with heart failure and stage 1 through stage 4 chronic kidney disease, or unspecified chronic kidney disease: Secondary | ICD-10-CM | POA: Diagnosis not present

## 2022-09-26 DIAGNOSIS — M85862 Other specified disorders of bone density and structure, left lower leg: Secondary | ICD-10-CM | POA: Diagnosis not present

## 2022-09-26 DIAGNOSIS — M25562 Pain in left knee: Secondary | ICD-10-CM | POA: Diagnosis not present

## 2022-09-26 DIAGNOSIS — N183 Chronic kidney disease, stage 3 unspecified: Secondary | ICD-10-CM | POA: Diagnosis not present

## 2022-09-26 DIAGNOSIS — I5032 Chronic diastolic (congestive) heart failure: Secondary | ICD-10-CM | POA: Diagnosis not present

## 2022-09-26 DIAGNOSIS — S82002A Unspecified fracture of left patella, initial encounter for closed fracture: Secondary | ICD-10-CM | POA: Diagnosis not present

## 2022-09-26 DIAGNOSIS — M40204 Unspecified kyphosis, thoracic region: Secondary | ICD-10-CM | POA: Diagnosis not present

## 2022-09-26 DIAGNOSIS — E039 Hypothyroidism, unspecified: Secondary | ICD-10-CM | POA: Diagnosis not present

## 2022-09-26 DIAGNOSIS — G4733 Obstructive sleep apnea (adult) (pediatric): Secondary | ICD-10-CM | POA: Diagnosis not present

## 2022-09-26 DIAGNOSIS — J449 Chronic obstructive pulmonary disease, unspecified: Secondary | ICD-10-CM | POA: Diagnosis not present

## 2022-09-26 DIAGNOSIS — I5033 Acute on chronic diastolic (congestive) heart failure: Secondary | ICD-10-CM | POA: Diagnosis not present

## 2022-10-03 DIAGNOSIS — E039 Hypothyroidism, unspecified: Secondary | ICD-10-CM | POA: Diagnosis not present

## 2022-10-03 DIAGNOSIS — M5412 Radiculopathy, cervical region: Secondary | ICD-10-CM | POA: Diagnosis not present

## 2022-10-03 DIAGNOSIS — M19032 Primary osteoarthritis, left wrist: Secondary | ICD-10-CM | POA: Diagnosis not present

## 2022-10-03 DIAGNOSIS — M1812 Unilateral primary osteoarthritis of first carpometacarpal joint, left hand: Secondary | ICD-10-CM | POA: Diagnosis not present

## 2022-10-03 DIAGNOSIS — I110 Hypertensive heart disease with heart failure: Secondary | ICD-10-CM | POA: Diagnosis not present

## 2022-10-03 DIAGNOSIS — G4733 Obstructive sleep apnea (adult) (pediatric): Secondary | ICD-10-CM | POA: Diagnosis not present

## 2022-10-03 DIAGNOSIS — J449 Chronic obstructive pulmonary disease, unspecified: Secondary | ICD-10-CM | POA: Diagnosis not present

## 2022-10-03 DIAGNOSIS — I5033 Acute on chronic diastolic (congestive) heart failure: Secondary | ICD-10-CM | POA: Diagnosis not present

## 2022-10-03 DIAGNOSIS — M40204 Unspecified kyphosis, thoracic region: Secondary | ICD-10-CM | POA: Diagnosis not present

## 2022-10-13 DIAGNOSIS — M19032 Primary osteoarthritis, left wrist: Secondary | ICD-10-CM | POA: Diagnosis not present

## 2022-10-13 DIAGNOSIS — J449 Chronic obstructive pulmonary disease, unspecified: Secondary | ICD-10-CM | POA: Diagnosis not present

## 2022-10-13 DIAGNOSIS — I110 Hypertensive heart disease with heart failure: Secondary | ICD-10-CM | POA: Diagnosis not present

## 2022-10-13 DIAGNOSIS — M40204 Unspecified kyphosis, thoracic region: Secondary | ICD-10-CM | POA: Diagnosis not present

## 2022-10-13 DIAGNOSIS — M5412 Radiculopathy, cervical region: Secondary | ICD-10-CM | POA: Diagnosis not present

## 2022-10-13 DIAGNOSIS — M1812 Unilateral primary osteoarthritis of first carpometacarpal joint, left hand: Secondary | ICD-10-CM | POA: Diagnosis not present

## 2022-10-13 DIAGNOSIS — G4733 Obstructive sleep apnea (adult) (pediatric): Secondary | ICD-10-CM | POA: Diagnosis not present

## 2022-10-13 DIAGNOSIS — E039 Hypothyroidism, unspecified: Secondary | ICD-10-CM | POA: Diagnosis not present

## 2022-10-13 DIAGNOSIS — I5033 Acute on chronic diastolic (congestive) heart failure: Secondary | ICD-10-CM | POA: Diagnosis not present

## 2022-10-16 DIAGNOSIS — I5032 Chronic diastolic (congestive) heart failure: Secondary | ICD-10-CM | POA: Diagnosis not present

## 2022-10-16 DIAGNOSIS — J411 Mucopurulent chronic bronchitis: Secondary | ICD-10-CM | POA: Diagnosis not present

## 2022-10-16 DIAGNOSIS — N1831 Chronic kidney disease, stage 3a: Secondary | ICD-10-CM | POA: Diagnosis not present

## 2022-10-19 DIAGNOSIS — M40204 Unspecified kyphosis, thoracic region: Secondary | ICD-10-CM | POA: Diagnosis not present

## 2022-10-19 DIAGNOSIS — M1812 Unilateral primary osteoarthritis of first carpometacarpal joint, left hand: Secondary | ICD-10-CM | POA: Diagnosis not present

## 2022-10-19 DIAGNOSIS — M19032 Primary osteoarthritis, left wrist: Secondary | ICD-10-CM | POA: Diagnosis not present

## 2022-10-19 DIAGNOSIS — I110 Hypertensive heart disease with heart failure: Secondary | ICD-10-CM | POA: Diagnosis not present

## 2022-10-19 DIAGNOSIS — I5033 Acute on chronic diastolic (congestive) heart failure: Secondary | ICD-10-CM | POA: Diagnosis not present

## 2022-10-19 DIAGNOSIS — M5412 Radiculopathy, cervical region: Secondary | ICD-10-CM | POA: Diagnosis not present

## 2022-10-19 DIAGNOSIS — E039 Hypothyroidism, unspecified: Secondary | ICD-10-CM | POA: Diagnosis not present

## 2022-10-19 DIAGNOSIS — G4733 Obstructive sleep apnea (adult) (pediatric): Secondary | ICD-10-CM | POA: Diagnosis not present

## 2022-10-19 DIAGNOSIS — J449 Chronic obstructive pulmonary disease, unspecified: Secondary | ICD-10-CM | POA: Diagnosis not present

## 2022-10-30 DIAGNOSIS — E034 Atrophy of thyroid (acquired): Secondary | ICD-10-CM | POA: Diagnosis not present

## 2022-10-30 DIAGNOSIS — E7800 Pure hypercholesterolemia, unspecified: Secondary | ICD-10-CM | POA: Diagnosis not present

## 2022-10-30 DIAGNOSIS — I10 Essential (primary) hypertension: Secondary | ICD-10-CM | POA: Diagnosis not present

## 2022-10-30 DIAGNOSIS — N1831 Chronic kidney disease, stage 3a: Secondary | ICD-10-CM | POA: Diagnosis not present

## 2022-11-06 DIAGNOSIS — M5136 Other intervertebral disc degeneration, lumbar region: Secondary | ICD-10-CM | POA: Diagnosis not present

## 2022-11-06 DIAGNOSIS — J449 Chronic obstructive pulmonary disease, unspecified: Secondary | ICD-10-CM | POA: Diagnosis not present

## 2022-11-06 DIAGNOSIS — Z0000 Encounter for general adult medical examination without abnormal findings: Secondary | ICD-10-CM | POA: Diagnosis not present

## 2022-11-06 DIAGNOSIS — N183 Chronic kidney disease, stage 3 unspecified: Secondary | ICD-10-CM | POA: Diagnosis not present

## 2022-11-06 DIAGNOSIS — G4730 Sleep apnea, unspecified: Secondary | ICD-10-CM | POA: Diagnosis not present

## 2022-11-06 DIAGNOSIS — G63 Polyneuropathy in diseases classified elsewhere: Secondary | ICD-10-CM | POA: Diagnosis not present

## 2022-11-06 DIAGNOSIS — I5032 Chronic diastolic (congestive) heart failure: Secondary | ICD-10-CM | POA: Diagnosis not present

## 2022-11-06 DIAGNOSIS — Z1331 Encounter for screening for depression: Secondary | ICD-10-CM | POA: Diagnosis not present

## 2022-11-06 DIAGNOSIS — J9611 Chronic respiratory failure with hypoxia: Secondary | ICD-10-CM | POA: Diagnosis not present

## 2022-11-06 DIAGNOSIS — E785 Hyperlipidemia, unspecified: Secondary | ICD-10-CM | POA: Diagnosis not present

## 2022-11-06 DIAGNOSIS — E034 Atrophy of thyroid (acquired): Secondary | ICD-10-CM | POA: Diagnosis not present

## 2022-11-09 ENCOUNTER — Other Ambulatory Visit: Payer: Self-pay | Admitting: Internal Medicine

## 2022-11-09 DIAGNOSIS — R911 Solitary pulmonary nodule: Secondary | ICD-10-CM

## 2022-11-09 DIAGNOSIS — Z1231 Encounter for screening mammogram for malignant neoplasm of breast: Secondary | ICD-10-CM

## 2022-11-09 DIAGNOSIS — J438 Other emphysema: Secondary | ICD-10-CM

## 2022-11-13 ENCOUNTER — Ambulatory Visit
Admission: RE | Admit: 2022-11-13 | Discharge: 2022-11-13 | Disposition: A | Discharge status: Home / Self Care | Payer: Medicare HMO | Source: Ambulatory Visit | Attending: Internal Medicine | Admitting: Internal Medicine

## 2022-11-13 DIAGNOSIS — J438 Other emphysema: Secondary | ICD-10-CM | POA: Diagnosis not present

## 2022-11-13 DIAGNOSIS — R911 Solitary pulmonary nodule: Secondary | ICD-10-CM | POA: Diagnosis not present

## 2022-11-13 DIAGNOSIS — M810 Age-related osteoporosis without current pathological fracture: Secondary | ICD-10-CM | POA: Diagnosis not present

## 2022-11-13 DIAGNOSIS — R918 Other nonspecific abnormal finding of lung field: Secondary | ICD-10-CM | POA: Diagnosis not present

## 2022-11-28 ENCOUNTER — Ambulatory Visit: Payer: Medicare HMO | Admitting: Obstetrics and Gynecology

## 2022-12-04 ENCOUNTER — Encounter: Payer: Self-pay | Admitting: Obstetrics and Gynecology

## 2022-12-04 ENCOUNTER — Ambulatory Visit (INDEPENDENT_AMBULATORY_CARE_PROVIDER_SITE_OTHER): Payer: Medicare HMO | Admitting: Obstetrics and Gynecology

## 2022-12-04 VITALS — BP 111/78 | HR 80 | Ht 66.0 in | Wt 213.0 lb

## 2022-12-04 DIAGNOSIS — R350 Frequency of micturition: Secondary | ICD-10-CM

## 2022-12-04 DIAGNOSIS — N393 Stress incontinence (female) (male): Secondary | ICD-10-CM | POA: Diagnosis not present

## 2022-12-04 DIAGNOSIS — N3281 Overactive bladder: Secondary | ICD-10-CM | POA: Diagnosis not present

## 2022-12-04 LAB — POCT URINALYSIS DIPSTICK
Bilirubin, UA: NEGATIVE
Blood, UA: NEGATIVE
Glucose, UA: NEGATIVE
Ketones, UA: NEGATIVE
Leukocytes, UA: NEGATIVE
Nitrite, UA: NEGATIVE
Protein, UA: NEGATIVE
Spec Grav, UA: 1.015 (ref 1.010–1.025)
Urobilinogen, UA: 0.2 U/dL
pH, UA: 6 (ref 5.0–8.0)

## 2022-12-04 MED ORDER — TROSPIUM CHLORIDE ER 60 MG PO CP24
1.0000 | ORAL_CAPSULE | Freq: Every day | ORAL | 5 refills | Status: AC
Start: 2022-12-04 — End: ?

## 2022-12-04 NOTE — Progress Notes (Signed)
Hannah Powell  Referring Provider: Lynnea Ferrier, MD PCP: Lynnea Ferrier, MD Date of Service: 12/04/2022  SUBJECTIVE Chief Complaint: New Patient (Initial Visit) Hannah Powell is a 77 y.o. female here for a consult for incontinence./)  History of Present Illness: Hannah Powell is a 77 y.o. White or Caucasian female presenting for evaluation of incontinence.    Review of records significant for: Seen by [facility] urogyn- has been using #1 incontinence dish pessary. Prescribed vibegron 75mg  for OAB, has also been on mybetriq 50mg .    Hospitalized in June for SOB and edema related to CHF. Follows with [facility]  Urinary Symptoms: Leaks urine with cough/ sneeze, exercise, lifting, going from sitting to standing, during sex, with a full bladder, with movement to the bathroom, without sensation, and while asleep Leaks 5- 6 time(s) per day. UUI> SUI Pad use: 6-8 pads per day.   She is bothered by her UI symptoms. She no longer has a pessary- took it out a week ago. She does feel like leakage has worsened a little bit since removing it.  No longer taking medication- tried vibegron for about 3 weeks, did not see a difference and caused constipation.   Day time voids 8-10.  Nocturia: 2-3 times per night to void. Voiding dysfunction: she empties her bladder well.  does not use a catheter to empty bladder.  When urinating, she feels the need to urinate multiple times in a row Drinks: 60oz water, 1- 20oz bottle diet dr pepper per day (has cut back on soda)  UTIs:  0  UTI's in the last year.   Denies history of blood in urine and kidney or bladder stones  Pelvic Organ Prolapse Symptoms:                  She Admits to a feeling of a bulge the vaginal area. It has been present for 6-8 months.  She Denies seeing a bulge.  This bulge is not bothersome.  Bowel Symptom: Bowel movements: 3-4 time(s) per week Stool  consistency: soft  Straining: no.  Splinting: no.  Incomplete evacuation: no.  She Denies accidental bowel leakage / fecal incontinence Bowel regimen: stool softener  Sexual Function Sexually active: yes.  Sexual orientation: Straight Pain with sex: No  Pelvic Pain Denies pelvic pain   Past Medical History:  Past Medical History:  Diagnosis Date   Anxiety    Arthritis    CHF (congestive heart failure) (HCC)    COPD (chronic obstructive pulmonary disease) (HCC)    Dysplastic nevus 04/22/2019   R abdomen parallel to sup umbilicus - mod   Dysplastic nevus 04/22/2019   R low back above waistline - mild    Edema    feet/legs   Hypertension    dr Darryll Capers      Hypothyroidism    Leg weakness, bilateral    Osteoporosis    Oxygen deficit    2l with bipap at night   RLS (restless legs syndrome)    Shortness of breath    Sleep apnea    bipap   Wheezing      Past Surgical History:   Past Surgical History:  Procedure Laterality Date   BACK SURGERY     cervical   BREAST BIOPSY Left    needle bx-neg   CATARACT EXTRACTION W/PHACO Left 05/07/2017   Procedure: CATARACT EXTRACTION PHACO AND INTRAOCULAR LENS PLACEMENT (IOC);  Surgeon: Galen Manila,  MD;  Location: [facility] ORS;  Service: Ophthalmology;  Laterality: Left;  [place] 00:48.7 AP% 11.3 CDE 5.46 Fluid Pack Lot # [id number]   CATARACT EXTRACTION W/PHACO Right 04/23/2017   Procedure: CATARACT EXTRACTION PHACO AND INTRAOCULAR LENS PLACEMENT (IOC);  Surgeon: Galen Manila, MD;  Location: [facility] ORS;  Service: Ophthalmology;  Laterality: Right;  [place] 00:29 AP% 12.8 CDE 3.79 FLUID PACK LOT # [id number]   CESAREAN SECTION     FOOT ARTHROPLASTY     JOINT REPLACEMENT     x2 tkr   TOTAL SHOULDER ARTHROPLASTY Left 07/02/2013   Procedure: LEFT TOTAL SHOULDER ARTHROPLASTY;  Surgeon: Senaida Lange, MD;  Location: [facility] OR;  Service: Orthopedics;  Laterality: Left;   TOTAL SHOULDER ARTHROPLASTY Right 04/10/2018   Procedure: TOTAL  REVERSE SHOULDER ARTHROPLASTY;  Surgeon: Francena Hanly, MD;  Location: [facility];  Service: Orthopedics;  Laterality: Right;      Past OB/GYN History: OB History  Gravida Para Term Preterm AB Living  2 2 2     2   SAB IAB Ectopic Multiple Live Births          2    # Outcome Date GA Lbr Len/2nd Weight Sex Type Anes PTL Lv  2 Term      CS-LTranv   LIV  1 Term      Vag-Spont   LIV    Menopausal: Denies vaginal bleeding since menopause  Any history of abnormal pap smears: no.   Medications: She has a current medication list which includes the following prescription(s): alprazolam, aspirin ec, azelastine, b-complex with vitamin c, calcium carbonate, chlorpheniramine-hydrocodone, estradiol, ferrous sulfate, breo ellipta, ipratropium-albuterol, ketoconazole, levothyroxine, loratadine-pseudoephedrine, modafinil, multiple vitamins-minerals, omega-3 fatty acids, potassium chloride sa, pramipexole, spironolactone, topiramate, torsemide, tramadol, trazodone, trospium chloride, and ventolin hfa.   Allergies: Patient is allergic to lyrica [pregabalin].   Social History:  Social History   Tobacco Use   Smoking status: Former    Current packs/day: 0.00    Average packs/day: 1 pack/day for 10.0 years (10.0 ttl pk-yrs)    Types: Cigarettes    Start date: 03/19/1965    Quit date: 03/20/1975    Years since quitting: 47.7   Smokeless tobacco: Never  Vaping Use   Vaping status: Never Used  Substance Use Topics   Alcohol use: No   Drug use: No    Relationship status: married She lives with husband.   She is not employed. Regular exercise: No History of abuse: No  Family History:   Family History  Problem Relation Age of Onset   Hypertension Mother    Diabetes Mother    Heart failure Father    Breast cancer Neg Hx      Review of Systems: Review of Systems  Constitutional:  Positive for malaise/fatigue. Negative for fever and weight loss.  Respiratory:  Negative for cough,  shortness of breath and wheezing.   Cardiovascular:  Positive for leg swelling. Negative for chest pain and palpitations.  Gastrointestinal:  Negative for abdominal pain and blood in stool.  Genitourinary:  Negative for dysuria.  Musculoskeletal:  Negative for myalgias.  Skin:  Negative for rash.  Neurological:  Negative for dizziness and headaches.  Endo/Heme/Allergies:  Does not bruise/bleed easily.  Psychiatric/Behavioral:  Negative for depression. The patient is not nervous/anxious.      OBJECTIVE Physical Exam: Vitals:   12/04/22 1105  BP: 111/78  Pulse: 80  Weight: 213 lb (96.6 kg)  Height: 5\' 6"  (1.676 m)    Physical Exam Constitutional:  General: She is not in acute distress. Pulmonary:     Effort: Pulmonary effort is normal.  Abdominal:     General: There is no distension.     Palpations: Abdomen is soft.     Tenderness: There is no abdominal tenderness. There is no rebound.     Comments: Midline incision, large umbilical hernia  Musculoskeletal:        General: Swelling present. Normal range of motion.     Right lower leg: Edema present.     Left lower leg: Edema present.  Skin:    General: Skin is warm and dry.     Findings: No rash.  Neurological:     Mental Status: She is alert and oriented to person, place, and time.  Psychiatric:        Mood and Affect: Mood normal.        Behavior: Behavior normal.      GU / Detailed Urogynecologic Evaluation:  Pelvic Exam: Normal external female genitalia; Bartholin's and Skene's glands normal in appearance; urethral meatus normal in appearance, no urethral masses or discharge.   CST: positive  Speculum exam reveals normal vaginal mucosa with atrophy. Cervix normal appearance. Uterus normal single, nontender. Adnexa no mass, fullness, tenderness.  #1 incontinence pessary replaced.    Pelvic floor strength I/V  Pelvic floor musculature: Right levator non-tender, Right obturator non-tender, Left levator  non-tender, Left obturator non-tender  POP-Q:   POP-Q  -3                                            Aa   -3                                           Ba  -9                                              C   1.5                                            Gh  4                                            Pb  9                                            tvl   -3                                            Ap  -3  Bp  -9                                              D      Rectal Exam:  Normal external rectum  Post-Void Residual (PVR) by Bladder Scan: In order to evaluate bladder emptying, we discussed obtaining a postvoid residual and she agreed to this procedure.  Procedure: The ultrasound unit was placed on the patient's abdomen in the suprapubic region after the patient had voided. A PVR of 2 ml was obtained by bladder scan.  Laboratory Results: POC urine: negative   ASSESSMENT AND PLAN Ms. Manfred is a 77 y.o. with:  1. Overactive bladder   2. Urinary frequency   3. SUI (stress urinary incontinence, female)    OAB - We discussed the symptoms of overactive bladder (OAB), which include urinary urgency, urinary frequency, nocturia, with or without urge incontinence.  While we do not know the exact etiology of OAB, several treatment options exist. We discussed management including behavioral therapy (decreasing bladder irritants, urge suppression strategies, timed voids, bladder retraining), physical therapy, medication; for refractory cases posterior tibial nerve stimulation, sacral neuromodulation, and intravesical botulinum toxin injection.  - Prescribed trospium 60mg  ER daily. For anticholinergic medications, we discussed the potential side effects of anticholinergics including dry eyes, dry mouth, constipation, cognitive impairment and urinary retention. - Would not be a candidate for PTNS due to significant LE edema. Not a  good surgical candidate. Offered botox but she is not interested.  - advised to reduce soda intake - Interested in pelvic PT, referral placed to [place]  2. SUI - suspect large component as she had large leakage with cough on exam today.  - currently using #1 incontinence ring. Discussed option of urethral bulking in office and handout provided.   Return 6 week for med follow up  Marguerita Beards, MD

## 2022-12-04 NOTE — Patient Instructions (Signed)
Today we talked about ways to manage bladder urgency such as altering your diet to avoid irritative beverages and foods (bladder diet) as well as attempting to decrease stress and other exacerbating factors.   The Most Bothersome Foods* The Least Bothersome Foods*  Coffee - Regular & Decaf Tea - caffeinated Carbonated beverages - cola, non-colas, diet & caffeine-free Alcohols - Beer, Red Wine, White Wine, [address] - Grapefruit, [place], Orange, [employer] - Cranberry, Grapefruit, Orange, Pineapple Vegetables - Tomato & Tomato Products Flavor Enhancers - Hot peppers, Spicy foods, Chili, Horseradish, Vinegar, Monosodium glutamate (MSG) Artificial Sweeteners - NutraSweet, Sweet 'N Low, Equal (sweetener), Saccharin Ethnic foods - [place], [place], [place] food [employer] - low-fat & whole Fruits - Bananas, Blueberries, Honeydew melon, Pears, Raisins, Watermelon Vegetables - Broccoli, [address] Sprouts, [place], Carrots, Cauliflower, [place], Cucumber, Mushrooms, Peas, Radishes, Squash, Zucchini, White potatoes, Sweet potatoes & yams Poultry - Chicken, Eggs, [place], [employer] - Beef, Diplomatic Services operational officer, Lamb Seafood - Shrimp, [place] fish, Salmon Grains - Oat, Rice Snacks - Pretzels, Popcorn  *Lenward Chancellor et al. Diet and its role in interstitial cystitis/bladder pain syndrome (IC/BPS) and comorbid conditions. [employer]. [employer]. 2012 Jan 11.

## 2022-12-06 DIAGNOSIS — N1831 Chronic kidney disease, stage 3a: Secondary | ICD-10-CM | POA: Diagnosis not present

## 2022-12-06 DIAGNOSIS — D72819 Decreased white blood cell count, unspecified: Secondary | ICD-10-CM | POA: Diagnosis not present

## 2022-12-10 ENCOUNTER — Inpatient Hospital Stay: Admission: RE | Admit: 2022-12-10 | Payer: Medicare HMO | Source: Ambulatory Visit

## 2022-12-11 ENCOUNTER — Ambulatory Visit: Payer: Medicare HMO | Admitting: Physical Therapy

## 2022-12-13 ENCOUNTER — Ambulatory Visit: Payer: Medicare HMO | Admitting: Physical Therapy

## 2022-12-18 ENCOUNTER — Encounter: Payer: Self-pay | Admitting: Physical Therapy

## 2022-12-18 ENCOUNTER — Ambulatory Visit: Payer: Medicare HMO | Attending: Internal Medicine | Admitting: Physical Therapy

## 2022-12-18 DIAGNOSIS — R2689 Other abnormalities of gait and mobility: Secondary | ICD-10-CM | POA: Insufficient documentation

## 2022-12-18 DIAGNOSIS — M5459 Other low back pain: Secondary | ICD-10-CM | POA: Insufficient documentation

## 2022-12-18 DIAGNOSIS — M6281 Muscle weakness (generalized): Secondary | ICD-10-CM | POA: Insufficient documentation

## 2022-12-18 DIAGNOSIS — R269 Unspecified abnormalities of gait and mobility: Secondary | ICD-10-CM | POA: Diagnosis not present

## 2022-12-18 DIAGNOSIS — R293 Abnormal posture: Secondary | ICD-10-CM | POA: Diagnosis not present

## 2022-12-18 NOTE — Therapy (Signed)
OUTPATIENT PHYSICAL THERAPY THORACOLUMBAR EVALUATION   Patient Name: Hannah Powell MRN: [id number] DOB:Aug 02, 1945, 77 y.o., female Today's Date: 12/19/2022  END OF SESSION:  PT End of Session - 12/18/22 0933     Visit Number 1    Number of Visits 16    Date for PT Re-Evaluation 02/12/23    PT Start Time 0933    PT Stop Time 1017    PT Time Calculation (min) 44 min    Activity Tolerance Patient tolerated treatment well    Behavior During Therapy [facility] for tasks assessed/performed             Past Medical History:  Diagnosis Date   Anxiety    Arthritis    CHF (congestive heart failure) (HCC)    COPD (chronic obstructive pulmonary disease) (HCC)    Dysplastic nevus 04/22/2019   R abdomen parallel to sup umbilicus - mod   Dysplastic nevus 04/22/2019   R low back above waistline - mild    Edema    feet/legs   Hypertension    dr Darryll Capers      Hypothyroidism    Leg weakness, bilateral    Osteoporosis    Oxygen deficit    2l with bipap at night   RLS (restless legs syndrome)    Shortness of breath    Sleep apnea    bipap   Wheezing    Past Surgical History:  Procedure Laterality Date   BACK SURGERY     cervical   BREAST BIOPSY Left    needle bx-neg   CATARACT EXTRACTION W/PHACO Left 05/07/2017   Procedure: CATARACT EXTRACTION PHACO AND INTRAOCULAR LENS PLACEMENT (IOC);  Surgeon: Galen Manila, MD;  Location: [facility] ORS;  Service: Ophthalmology;  Laterality: Left;  [place] 00:48.7 AP% 11.3 CDE 5.46 Fluid Pack Lot # [id number]   CATARACT EXTRACTION W/PHACO Right 04/23/2017   Procedure: CATARACT EXTRACTION PHACO AND INTRAOCULAR LENS PLACEMENT (IOC);  Surgeon: Galen Manila, MD;  Location: [facility] ORS;  Service: Ophthalmology;  Laterality: Right;  [place] 00:29 AP% 12.8 CDE 3.79 FLUID PACK LOT # [id number]   CESAREAN SECTION     FOOT ARTHROPLASTY     JOINT REPLACEMENT     x2 tkr   TOTAL SHOULDER ARTHROPLASTY Left 07/02/2013   Procedure: LEFT TOTAL SHOULDER  ARTHROPLASTY;  Surgeon: Senaida Lange, MD;  Location: [facility];  Service: Orthopedics;  Laterality: Left;   TOTAL SHOULDER ARTHROPLASTY Right 04/10/2018   Procedure: TOTAL REVERSE SHOULDER ARTHROPLASTY;  Surgeon: Francena Hanly, MD;  Location: [facility];  Service: Orthopedics;  Laterality: Right;    Patient Active Problem List   Diagnosis Date Noted   Bilateral leg pain 08/29/2022   Cervical radiculopathy 08/28/2022   CHF exacerbation (HCC) 08/26/2022   Swelling of lower extremity 08/26/2022   Acute on chronic respiratory failure with hypoxia and hypercapnia (HCC) 03/29/2022   Chronic diastolic CHF (congestive heart failure) (HCC) 03/29/2022   Cellulitis of right lower extremity 03/29/2022   Restless leg syndrome 02/08/2022   Hypothyroidism 02/08/2022   Obesity (BMI 30-39.9) 02/08/2022   Insomnia 02/08/2022   Anxiety 02/08/2022   Poor appetite 02/08/2022   S/P reverse total shoulder arthroplasty, 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   Chronic diastolic heart failure (HCC) 08/09/2016   COPD (chronic obstructive pulmonary disease) (HCC) 08/09/2016   Pulmonary infiltrates    Pneumonia due to Streptococcus pneumoniae (HCC)    Fever    Community acquired pneumonia of right lung    Palliative care by specialist    Goals of  care, counseling/discussion    Pressure injury of skin 07/03/2016   Acute respiratory failure with hypoxia (HCC) 07/02/2016   Acute on chronic diastolic CHF (congestive heart failure) (HCC) 07/21/2015   COPD exacerbation (HCC) 07/21/2015   Acute renal insufficiency 07/21/2015   Hyperglycemia 07/21/2015   HTN (hypertension) 07/21/2015   S/P shoulder replacement 07/02/2013    PCP: Lynnea Ferrier, MD  REFERRING PROVIDER: Lynnea Ferrier, MD  REFERRING DIAG: Lumbar DDD, Neuropathy  Rationale for Evaluation and Treatment: Rehabilitation  THERAPY DIAG:  Other low back pain  Muscle weakness (generalized)  Gait difficulty  Abnormal posture  Imbalance  ONSET DATE:  03/20/2019  SUBJECTIVE:                                                                                                                                                                                           SUBJECTIVE STATEMENT: Pt. Reports chronic h/o lumbar stenosis/ back pain.  Pt. Reports 8/10 low back pain first thing in the morning and uses heating pad/ ibuprofen to decrease back pain to 1/10 after about 2 hours.  Pt. Reports no recent falls and using elevated RW for short distance ambulation and w/c for community and some household mobility.  Pt. States she has poor standing/walking endurance and is very slow with movement, which is why she requires use of w/c.  Increase episodes of COPD since last PT tx. Session.  Pt. Has 2 weddings to attend on 10/26 and 11/16.    PERTINENT HISTORY:  See MD notes.    PAIN:  Are you having pain? Yes: NPRS scale: 8/10 Pain location: low back Pain description: aching Aggravating factors: lying supine/ getting out of bed.  Relieving factors: moist heat  PRECAUTIONS: Fall  RED FLAGS: None   WEIGHT BEARING RESTRICTIONS: No  FALLS:  Has patient fallen in last 6 months? No  LIVING ENVIRONMENT: Lives with: lives with their spouse Lives in: House/apartment Stairs: No Has following equipment at home: Environmental consultant - 2 wheeled, Wheelchair (manual), and Tour manager  OCCUPATION: Retired  PLOF: Requires assistive device for independence  PATIENT GOALS: Increase standing/walking endurance.  Decrease low back pain.  NEXT MD VISIT: PRN  OBJECTIVE:  Note: Objective measures were completed at Evaluation unless otherwise noted.  DIAGNOSTIC FINDINGS:  COMPARISON:  Lumbar spine MRI from 06/26/2020   FINDINGS: Normal alignment. The vertebral body heights are well preserved. No signs of acute fracture or subluxation. Moderate multilevel disc space narrowing and anterior and posterior spur formation. Facet arthropathy identified within the lower lumbar  spine. Aortic atherosclerosis. Moderate stool burden noted within the colon.   IMPRESSION: 1. No  acute findings. 2. Moderate multilevel degenerative disc disease and facet arthropathy.  PATIENT SURVEYS:  FOTO initial 40/ goal 68  SCREENING FOR RED FLAGS: Bowel or bladder incontinence: yes Spinal tumors: No Cauda equina syndrome: No Compression fracture: No Abdominal aneurysm: No  COGNITION: Overall cognitive status: Within functional limits for tasks assessed     SENSATION: WFL, moderate lower leg swelling noted.  MUSCLE LENGTH: No supine positioning today Hamstrings: NT Thomas test: NT   POSTURE: rounded shoulders, forward head, increased thoracic kyphosis, and flexed trunk   PALPATION: Tenderness over lumbar paraspinals (generalized).  LUMBAR ROM:   AROM eval  Flexion 50% limited  Extension 100% limited  Right lateral flexion 50% limited  Left lateral flexion 50% limited  Right rotation 50% limited  Left rotation 50% limited   (Blank rows = not tested)  LOWER EXTREMITY ROM:       B hip flexion/ knee extension limited.    LOWER EXTREMITY MMT:       B LE muscle strength grossly 4/5 MMT except hamstring 4+/5 MMT and hip flexion 4-/5 MMT  Ascends steps with L LE and heavy UE assist.    LUMBAR SPECIAL TESTS:  Next tx. Session.  No supine positioning today.  FUNCTIONAL TESTS:  5 times sit to stand: 22.13 sec. With use of armrests.  Pt. Limited with STS from gray chair without UE assist.   6 minute walk test: TBD  GAIT: Distance walked: in clinic with elevated RW Assistive device utilized: Walker - 2 wheeled Level of assistance: Modified independence Comments: Significant forward head/ flexed posture/ thoracic kyphosis.  Slow, controlled gait pattern.  Fear of L knee buckling with R LE swing through phase of gait.   TODAY'S TREATMENT:                                                                                                                               DATE: 12/18/2022   Evaluation/ discussed HEP/ daily walking with RW.  Nustep L4 10 min. B UE/LE.  Pt. Has not been attending gym.   PATIENT EDUCATION:  Education details: Daily walking/ posture Person educated: Patient Education method: Medical illustrator Education comprehension: verbalized understanding and returned demonstration  HOME EXERCISE PROGRAM: Will issue next tx. session  ASSESSMENT:  CLINICAL IMPRESSION: Patient is a pleasant 77 y.o. female who was seen today for physical therapy evaluation and treatment for low back pain/ gait difficulty/ muscle weakness.  Pt. Presents with moderate hip/ core muscle weakness and difficulty with sit to stands without UE assist.  Pt. Ambulates with significant forward flexed posture/ heavy forearm support.  No recent falls and c/o moderate low back pain in morning and with daily tasks.  Pt. Will benefit from skilled PT services in combination with gym based ex. To improve LE strengthening/ pain-free mobility.    OBJECTIVE IMPAIRMENTS: Abnormal gait, decreased activity tolerance, decreased balance, decreased endurance, decreased mobility, difficulty walking, decreased ROM, decreased strength, hypomobility, increased  edema, impaired flexibility, improper body mechanics, postural dysfunction, and pain.   ACTIVITY LIMITATIONS: bending, sitting, standing, squatting, stairs, transfers, bed mobility, toileting, and locomotion level  PARTICIPATION LIMITATIONS: medication management  PERSONAL FACTORS: Fitness and Past/current experiences are also affecting patient's functional outcome.   REHAB POTENTIAL: Good  CLINICAL DECISION MAKING: Evolving/moderate complexity  EVALUATION COMPLEXITY: Moderate   GOALS: Goals reviewed with patient? Yes  SHORT TERM GOALS: Target date: 01/08/23  Pt. Will be independent with HEP to increase B LE muscle strength 1/2 muscle grade to improve transfer/ safety with walking.  Baseline:  see  above Goal status: INITIAL  2.  Pt. Will complete with no rest breaks to improve walking endurance.   Baseline: TBD Goal status: INITIAL   LONG TERM GOALS: Target date: 02/12/23  Pt. Will increase FOTO to 51 to improve pain-free functional mobility.  Baseline:  initial 40 Goal status: INITIAL  2.  Pt. Able to stand from 21# chair height with no UE assist or low back pain to improve transfers/ independence with daily activity.   Baseline: benefits from armrests/ UE assist.  Goal status: INITIAL  3.  Pt. Able to return to gym based ex. Program with no limitations or c/o low back pain.   Baseline: currently not exercising or going to gym. Goal status: INITIAL   PLAN:  PT FREQUENCY: 2x/week  PT DURATION: 8 weeks  PLANNED INTERVENTIONS: Therapeutic exercises, Therapeutic activity, Neuromuscular re-education, Balance training, Gait training, Patient/Family education, Self Care, Joint mobilization, DME instructions, Dry Needling, Electrical stimulation, Cryotherapy, Moist heat, and Manual therapy.  PLAN FOR NEXT SESSION: Issue HEP   Cammie Mcgee, PT, DPT # [id number] 12/19/2022, 8:43 AM

## 2022-12-20 ENCOUNTER — Ambulatory Visit: Payer: Medicare HMO | Admitting: Physical Therapy

## 2022-12-20 DIAGNOSIS — R293 Abnormal posture: Secondary | ICD-10-CM

## 2022-12-20 DIAGNOSIS — R2689 Other abnormalities of gait and mobility: Secondary | ICD-10-CM

## 2022-12-20 DIAGNOSIS — R269 Unspecified abnormalities of gait and mobility: Secondary | ICD-10-CM

## 2022-12-20 DIAGNOSIS — M6281 Muscle weakness (generalized): Secondary | ICD-10-CM | POA: Diagnosis not present

## 2022-12-20 DIAGNOSIS — M5459 Other low back pain: Secondary | ICD-10-CM

## 2022-12-22 NOTE — Therapy (Signed)
OUTPATIENT PHYSICAL THERAPY THORACOLUMBAR TREATMENT   Patient Name: Hannah Powell MRN: [id number] DOB:11/08/1945, 77 y.o., female Today's Date: 12/20/2022  END OF SESSION:  PT End of Session - 12/22/22 1907     Visit Number 2    Number of Visits 16    Date for PT Re-Evaluation 02/12/23    PT Start Time 0929    PT Stop Time 1028    PT Time Calculation (min) 59 min    Activity Tolerance Patient tolerated treatment well    Behavior During Therapy [facility] for tasks assessed/performed             Past Medical History:  Diagnosis Date   Anxiety    Arthritis    CHF (congestive heart failure) (HCC)    COPD (chronic obstructive pulmonary disease) (HCC)    Dysplastic nevus 04/22/2019   R abdomen parallel to sup umbilicus - mod   Dysplastic nevus 04/22/2019   R low back above waistline - mild    Edema    feet/legs   Hypertension    dr Darryll Capers      Hypothyroidism    Leg weakness, bilateral    Osteoporosis    Oxygen deficit    2l with bipap at night   RLS (restless legs syndrome)    Shortness of breath    Sleep apnea    bipap   Wheezing    Past Surgical History:  Procedure Laterality Date   BACK SURGERY     cervical   BREAST BIOPSY Left    needle bx-neg   CATARACT EXTRACTION W/PHACO Left 05/07/2017   Procedure: CATARACT EXTRACTION PHACO AND INTRAOCULAR LENS PLACEMENT (IOC);  Surgeon: Galen Manila, MD;  Location: [facility] ORS;  Service: Ophthalmology;  Laterality: Left;  [place] 00:48.7 AP% 11.3 CDE 5.46 Fluid Pack Lot # [id number]   CATARACT EXTRACTION W/PHACO Right 04/23/2017   Procedure: CATARACT EXTRACTION PHACO AND INTRAOCULAR LENS PLACEMENT (IOC);  Surgeon: Galen Manila, MD;  Location: [facility] ORS;  Service: Ophthalmology;  Laterality: Right;  [place] 00:29 AP% 12.8 CDE 3.79 FLUID PACK LOT # [id number]   CESAREAN SECTION     FOOT ARTHROPLASTY     JOINT REPLACEMENT     x2 tkr   TOTAL SHOULDER ARTHROPLASTY Left 07/02/2013   Procedure: LEFT TOTAL SHOULDER  ARTHROPLASTY;  Surgeon: Senaida Lange, MD;  Location: [facility];  Service: Orthopedics;  Laterality: Left;   TOTAL SHOULDER ARTHROPLASTY Right 04/10/2018   Procedure: TOTAL REVERSE SHOULDER ARTHROPLASTY;  Surgeon: Francena Hanly, MD;  Location: [facility];  Service: Orthopedics;  Laterality: Right;    Patient Active Problem List   Diagnosis Date Noted   Bilateral leg pain 08/29/2022   Cervical radiculopathy 08/28/2022   CHF exacerbation (HCC) 08/26/2022   Swelling of lower extremity 08/26/2022   Acute on chronic respiratory failure with hypoxia and hypercapnia (HCC) 03/29/2022   Chronic diastolic CHF (congestive heart failure) (HCC) 03/29/2022   Cellulitis of right lower extremity 03/29/2022   Restless leg syndrome 02/08/2022   Hypothyroidism 02/08/2022   Obesity (BMI 30-39.9) 02/08/2022   Insomnia 02/08/2022   Anxiety 02/08/2022   Poor appetite 02/08/2022   S/P reverse total shoulder arthroplasty, 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   Chronic diastolic heart failure (HCC) 08/09/2016   COPD (chronic obstructive pulmonary disease) (HCC) 08/09/2016   Pulmonary infiltrates    Pneumonia due to Streptococcus pneumoniae (HCC)    Fever    Community acquired pneumonia of right lung    Palliative care by specialist    Goals of  care, counseling/discussion    Pressure injury of skin 07/03/2016   Acute respiratory failure with hypoxia (HCC) 07/02/2016   Acute on chronic diastolic CHF (congestive heart failure) (HCC) 07/21/2015   COPD exacerbation (HCC) 07/21/2015   Acute renal insufficiency 07/21/2015   Hyperglycemia 07/21/2015   HTN (hypertension) 07/21/2015   S/P shoulder replacement 07/02/2013    PCP: Lynnea Ferrier, MD  REFERRING PROVIDER: Lynnea Ferrier, MD  REFERRING DIAG: Lumbar DDD, Neuropathy  Rationale for Evaluation and Treatment: Rehabilitation  THERAPY DIAG:  Other low back pain  Muscle weakness (generalized)  Gait difficulty  Abnormal posture  Imbalance  ONSET DATE:  03/20/2019  SUBJECTIVE:                                                                                                                                                                                           SUBJECTIVE STATEMENT: Pt. Reports chronic h/o lumbar stenosis/ back pain.  Pt. Reports 8/10 low back pain first thing in the morning and uses heating pad/ ibuprofen to decrease back pain to 1/10 after about 2 hours.  Pt. Reports no recent falls and using elevated RW for short distance ambulation and w/c for community and some household mobility.  Pt. States she has poor standing/walking endurance and is very slow with movement, which is why she requires use of w/c.  Increase episodes of COPD since last PT tx. Session.  Pt. Has 2 weddings to attend on 10/26 and 11/16.    PERTINENT HISTORY:  See MD notes.    PAIN:  Are you having pain? Yes: NPRS scale: 8/10 Pain location: low back Pain description: aching Aggravating factors: lying supine/ getting out of bed.  Relieving factors: moist heat  PRECAUTIONS: Fall  RED FLAGS: None   WEIGHT BEARING RESTRICTIONS: No  FALLS:  Has patient fallen in last 6 months? No  LIVING ENVIRONMENT: Lives with: lives with their spouse Lives in: House/apartment Stairs: No Has following equipment at home: Environmental consultant - 2 wheeled, Wheelchair (manual), and Tour manager  OCCUPATION: Retired  PLOF: Requires assistive device for independence  PATIENT GOALS: Increase standing/walking endurance.  Decrease low back pain.  NEXT MD VISIT: PRN  OBJECTIVE:  Note: Objective measures were completed at Evaluation unless otherwise noted.  DIAGNOSTIC FINDINGS:  COMPARISON:  Lumbar spine MRI from 06/26/2020   FINDINGS: Normal alignment. The vertebral body heights are well preserved. No signs of acute fracture or subluxation. Moderate multilevel disc space narrowing and anterior and posterior spur formation. Facet arthropathy identified within the lower lumbar  spine. Aortic atherosclerosis. Moderate stool burden noted within the colon.   IMPRESSION: 1. No  acute findings. 2. Moderate multilevel degenerative disc disease and facet arthropathy.  PATIENT SURVEYS:  FOTO initial 40/ goal 15  SCREENING FOR RED FLAGS: Bowel or bladder incontinence: yes Spinal tumors: No Cauda equina syndrome: No Compression fracture: No Abdominal aneurysm: No  COGNITION: Overall cognitive status: Within functional limits for tasks assessed     SENSATION: WFL, moderate lower leg swelling noted.  MUSCLE LENGTH: No supine positioning today Hamstrings: NT Thomas test: NT   POSTURE: rounded shoulders, forward head, increased thoracic kyphosis, and flexed trunk   PALPATION: Tenderness over lumbar paraspinals (generalized).  LUMBAR ROM:   AROM eval  Flexion 50% limited  Extension 100% limited  Right lateral flexion 50% limited  Left lateral flexion 50% limited  Right rotation 50% limited  Left rotation 50% limited   (Blank rows = not tested)  LOWER EXTREMITY ROM:       B hip flexion/ knee extension limited.    LOWER EXTREMITY MMT:       B LE muscle strength grossly 4/5 MMT except hamstring 4+/5 MMT and hip flexion 4-/5 MMT  Ascends steps with L LE and heavy UE assist.    LUMBAR SPECIAL TESTS:  Next tx. Session.  No supine positioning today.  FUNCTIONAL TESTS:  5 times sit to stand: 22.13 sec. With use of armrests.  Pt. Limited with STS from gray chair without UE assist.   6 minute walk test: TBD  GAIT: Distance walked: in clinic with elevated RW Assistive device utilized: Walker - 2 wheeled Level of assistance: Modified independence Comments: Significant forward head/ flexed posture/ thoracic kyphosis.  Slow, controlled gait pattern.  Fear of L knee buckling with R LE swing through phase of gait.   TODAY'S TREATMENT:                                                                                                                               DATE: 12/20/2022   Subjective:  Pt. Entered PT with use of RW and slow gait pattern.  Pt. Reports no low back symptoms at this time.  Pt. States she has been able to get in/out of SUV with mod. Independence.   There.ex.:  Walking in //-bars with high marching and cuing to increase heel strike/ consistent recip. Step pattern.  Use of mirror for posture correction.  Backwards/ lateral walking 3 laps each.    Standing marching while maintaining upright posture 20x.    Walking in clinic with use of elevated RW (adjusted height 1 notch higher).    Nustep L4 10 min. B UE/LE.  Pt. Has not been attending gym.  STS from gray chair with use of arm rests  Discussed HEP/ importance of daily walking.     PATIENT EDUCATION:  Education details: Daily walking/ posture Person educated: Patient Education method: Medical illustrator Education comprehension: verbalized understanding and returned demonstration  HOME EXERCISE PROGRAM: Will issue next tx. session  ASSESSMENT:  CLINICAL IMPRESSION: Pt. presents  with moderate hip/ core muscle weakness and difficulty with sit to stands without UE assist.  Pt. Ambulates with significant forward flexed posture/ heavy forearm support.  No c/o back pain at this time and during standing ex./ walking in clinic.  Pt. Will benefit from skilled PT services in combination with gym based ex. To improve LE strengthening/ pain-free mobility.    OBJECTIVE IMPAIRMENTS: Abnormal gait, decreased activity tolerance, decreased balance, decreased endurance, decreased mobility, difficulty walking, decreased ROM, decreased strength, hypomobility, increased edema, impaired flexibility, improper body mechanics, postural dysfunction, and pain.   ACTIVITY LIMITATIONS: bending, sitting, standing, squatting, stairs, transfers, bed mobility, toileting, and locomotion level  PARTICIPATION LIMITATIONS: medication management  PERSONAL FACTORS: Fitness and Past/current  experiences are also affecting patient's functional outcome.   REHAB POTENTIAL: Good  CLINICAL DECISION MAKING: Evolving/moderate complexity  EVALUATION COMPLEXITY: Moderate   GOALS: Goals reviewed with patient? Yes  SHORT TERM GOALS: Target date: 01/08/23  Pt. Will be independent with HEP to increase B LE muscle strength 1/2 muscle grade to improve transfer/ safety with walking.  Baseline:  see above Goal status: INITIAL  2.  Pt. Will complete with no rest breaks to improve walking endurance.   Baseline: TBD Goal status: INITIAL   LONG TERM GOALS: Target date: 02/12/23  Pt. Will increase FOTO to 51 to improve pain-free functional mobility.  Baseline:  initial 40 Goal status: INITIAL  2.  Pt. Able to stand from 21# chair height with no UE assist or low back pain to improve transfers/ independence with daily activity.   Baseline: benefits from armrests/ UE assist.  Goal status: INITIAL  3.  Pt. Able to return to gym based ex. Program with no limitations or c/o low back pain.   Baseline: currently not exercising or going to gym. Goal status: INITIAL   PLAN:  PT FREQUENCY: 2x/week  PT DURATION: 8 weeks  PLANNED INTERVENTIONS: Therapeutic exercises, Therapeutic activity, Neuromuscular re-education, Balance training, Gait training, Patient/Family education, Self Care, Joint mobilization, DME instructions, Dry Needling, Electrical stimulation, Cryotherapy, Moist heat, and Manual therapy.  PLAN FOR NEXT SESSION: Issue HEP   Cammie Mcgee, PT, DPT # [phone] 12/22/2022, 7:08 PM

## 2022-12-25 ENCOUNTER — Encounter: Payer: Self-pay | Admitting: Physical Therapy

## 2022-12-25 ENCOUNTER — Ambulatory Visit: Payer: Medicare HMO | Admitting: Physical Therapy

## 2022-12-25 DIAGNOSIS — M5459 Other low back pain: Secondary | ICD-10-CM

## 2022-12-25 DIAGNOSIS — R2689 Other abnormalities of gait and mobility: Secondary | ICD-10-CM | POA: Diagnosis not present

## 2022-12-25 DIAGNOSIS — M6281 Muscle weakness (generalized): Secondary | ICD-10-CM

## 2022-12-25 DIAGNOSIS — R269 Unspecified abnormalities of gait and mobility: Secondary | ICD-10-CM

## 2022-12-25 DIAGNOSIS — R293 Abnormal posture: Secondary | ICD-10-CM | POA: Diagnosis not present

## 2022-12-25 NOTE — Therapy (Signed)
OUTPATIENT PHYSICAL THERAPY THORACOLUMBAR TREATMENT   Patient Name: Hannah Powell MRN: [id number] DOB:[birth year], 77 y.o., female Today's Date: 12/25/2022  END OF SESSION:  PT End of Session - 12/25/22 0935     Visit Number 3    Number of Visits 16    Date for PT Re-Evaluation 02/12/23    PT Start Time 0935    Activity Tolerance Patient tolerated treatment well    Behavior During Therapy [facility] for tasks assessed/performed            0935 to 1028 (53 minutes)   Past Medical History:  Diagnosis Date   Anxiety    Arthritis    CHF (congestive heart failure) (HCC)    COPD (chronic obstructive pulmonary disease) (HCC)    Dysplastic nevus 04/22/2019   R abdomen parallel to sup umbilicus - mod   Dysplastic nevus 04/22/2019   R low back above waistline - mild    Edema    feet/legs   Hypertension    dr Darryll Capers      Hypothyroidism    Leg weakness, bilateral    Osteoporosis    Oxygen deficit    2l with bipap at night   RLS (restless legs syndrome)    Shortness of breath    Sleep apnea    bipap   Wheezing    Past Surgical History:  Procedure Laterality Date   BACK SURGERY     cervical   BREAST BIOPSY Left    needle bx-neg   CATARACT EXTRACTION W/PHACO Left 05/07/2017   Procedure: CATARACT EXTRACTION PHACO AND INTRAOCULAR LENS PLACEMENT (IOC);  Surgeon: Galen Manila, MD;  Location: [facility] ORS;  Service: Ophthalmology;  Laterality: Left;  [place] 00:48.7 AP% 11.3 CDE 5.46 Fluid Pack Lot # [id number]   CATARACT EXTRACTION W/PHACO Right 04/23/2017   Procedure: CATARACT EXTRACTION PHACO AND INTRAOCULAR LENS PLACEMENT (IOC);  Surgeon: Galen Manila, MD;  Location: [facility] ORS;  Service: Ophthalmology;  Laterality: Right;  [place] 00:29 AP% 12.8 CDE 3.79 FLUID PACK LOT # [id number]   CESAREAN SECTION     FOOT ARTHROPLASTY     JOINT REPLACEMENT     x2 tkr   TOTAL SHOULDER ARTHROPLASTY Left 07/02/2013   Procedure: LEFT TOTAL SHOULDER ARTHROPLASTY;  Surgeon: Senaida Lange,  MD;  Location: [facility];  Service: Orthopedics;  Laterality: Left;   TOTAL SHOULDER ARTHROPLASTY Right 04/10/2018   Procedure: TOTAL REVERSE SHOULDER ARTHROPLASTY;  Surgeon: Francena Hanly, MD;  Location: [facility];  Service: Orthopedics;  Laterality: Right;    Patient Active Problem List   Diagnosis Date Noted   Bilateral leg pain 08/29/2022   Cervical radiculopathy 08/28/2022   CHF exacerbation (HCC) 08/26/2022   Swelling of lower extremity 08/26/2022   Acute on chronic respiratory failure with hypoxia and hypercapnia (HCC) 03/29/2022   Chronic diastolic CHF (congestive heart failure) (HCC) 03/29/2022   Cellulitis of right lower extremity 03/29/2022   Restless leg syndrome 02/08/2022   Hypothyroidism 02/08/2022   Obesity (BMI 30-39.9) 02/08/2022   Insomnia 02/08/2022   Anxiety 02/08/2022   Poor appetite 02/08/2022   S/P reverse total shoulder arthroplasty, 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   Chronic diastolic heart failure (HCC) 08/09/2016   COPD (chronic obstructive pulmonary disease) (HCC) 08/09/2016   Pulmonary infiltrates    Pneumonia due to Streptococcus pneumoniae (HCC)    Fever    Community acquired pneumonia of right lung    Palliative care by specialist    Goals of care, counseling/discussion    Pressure injury of skin 07/03/2016  Acute respiratory failure with hypoxia (HCC) 07/02/2016   Acute on chronic diastolic CHF (congestive heart failure) (HCC) 07/21/2015   COPD exacerbation (HCC) 07/21/2015   Acute renal insufficiency 07/21/2015   Hyperglycemia 07/21/2015   HTN (hypertension) 07/21/2015   S/P shoulder replacement 07/02/2013    PCP: Lynnea Ferrier, MD  REFERRING PROVIDER: Lynnea Ferrier, MD  REFERRING DIAG: Lumbar DDD, Neuropathy  Rationale for Evaluation and Treatment: Rehabilitation  THERAPY DIAG:  Other low back pain  Muscle weakness (generalized)  Gait difficulty  Abnormal posture  Imbalance  ONSET DATE: 03/20/2019  SUBJECTIVE:                                                                                                                                                                                            SUBJECTIVE STATEMENT: Pt. Reports chronic h/o lumbar stenosis/ back pain.  Pt. Reports 8/10 low back pain first thing in the morning and uses heating pad/ ibuprofen to decrease back pain to 1/10 after about 2 hours.  Pt. Reports no recent falls and using elevated RW for short distance ambulation and w/c for community and some household mobility.  Pt. States she has poor standing/walking endurance and is very slow with movement, which is why she requires use of w/c.  Increase episodes of COPD since last PT tx. Session.  Pt. Has 2 weddings to attend on 10/26 and 11/16.    PERTINENT HISTORY:  See MD notes.    PAIN:  Are you having pain? Yes: NPRS scale: 8/10 Pain location: low back Pain description: aching Aggravating factors: lying supine/ getting out of bed.  Relieving factors: moist heat  PRECAUTIONS: Fall  RED FLAGS: None   WEIGHT BEARING RESTRICTIONS: No  FALLS:  Has patient fallen in last 6 months? No  LIVING ENVIRONMENT: Lives with: lives with their spouse Lives in: House/apartment Stairs: No Has following equipment at home: Environmental consultant - 2 wheeled, Wheelchair (manual), and Tour manager  OCCUPATION: Retired  PLOF: Requires assistive device for independence  PATIENT GOALS: Increase standing/walking endurance.  Decrease low back pain.  NEXT MD VISIT: PRN  OBJECTIVE:  Note: Objective measures were completed at Evaluation unless otherwise noted.  DIAGNOSTIC FINDINGS:  COMPARISON:  Lumbar spine MRI from 06/26/2020   FINDINGS: Normal alignment. The vertebral body heights are well preserved. No signs of acute fracture or subluxation. Moderate multilevel disc space narrowing and anterior and posterior spur formation. Facet arthropathy identified within the lower lumbar spine. Aortic atherosclerosis. Moderate stool  burden noted within the colon.   IMPRESSION: 1. No acute findings. 2. Moderate multilevel degenerative disc disease and facet arthropathy.  PATIENT SURVEYS:  FOTO initial 40/ goal 35  SCREENING FOR RED FLAGS: Bowel or bladder incontinence: yes Spinal tumors: No Cauda equina syndrome: No Compression fracture: No Abdominal aneurysm: No  COGNITION: Overall cognitive status: Within functional limits for tasks assessed     SENSATION: WFL, moderate lower leg swelling noted.  MUSCLE LENGTH: No supine positioning today Hamstrings: NT Thomas test: NT   POSTURE: rounded shoulders, forward head, increased thoracic kyphosis, and flexed trunk   PALPATION: Tenderness over lumbar paraspinals (generalized).  LUMBAR ROM:   AROM eval  Flexion 50% limited  Extension 100% limited  Right lateral flexion 50% limited  Left lateral flexion 50% limited  Right rotation 50% limited  Left rotation 50% limited   (Blank rows = not tested)  LOWER EXTREMITY ROM:       B hip flexion/ knee extension limited.    LOWER EXTREMITY MMT:       B LE muscle strength grossly 4/5 MMT except hamstring 4+/5 MMT and hip flexion 4-/5 MMT  Ascends steps with L LE and heavy UE assist.    LUMBAR SPECIAL TESTS:  Next tx. Session.  No supine positioning today.  FUNCTIONAL TESTS:  5 times sit to stand: 22.13 sec. With use of armrests.  Pt. Limited with STS from gray chair without UE assist.   6 minute walk test: TBD  GAIT: Distance walked: in clinic with elevated RW Assistive device utilized: Walker - 2 wheeled Level of assistance: Modified independence Comments: Significant forward head/ flexed posture/ thoracic kyphosis.  Slow, controlled gait pattern.  Fear of L knee buckling with R LE swing through phase of gait.   TODAY'S TREATMENT:                                                                                                                              DATE: 12/25/2022  Subjective:  Pt.  Entered PT with use of RW and slow gait pattern.  Pt. Reports no low back symptoms at this time.  Pt. States she is really tired today and did bike at gym yesterday.     There.ex.:  Walking in clinic with use of elevated RW (cuing for recip. Gait pattern/ posture correction).    Supine L/R LE stretches (hamstring/ trunk rotn./ hip and piriformis stretches)- all planes as tolerated.  Slight elevated head/neck position on mat table for comfortable.   Supine SAQ with holds/ bolster bridging/ SLR 10x each.    Discussed HEP/ importance of daily walking.     PATIENT EDUCATION:  Education details: Daily walking/ posture Person educated: Patient Education method: Medical illustrator Education comprehension: verbalized understanding and returned demonstration  HOME EXERCISE PROGRAM: Will issue next tx. session  ASSESSMENT:  CLINICAL IMPRESSION: Pt. presents with moderate hip/ core muscle weakness and difficulty with sit to stands without UE assist.  Pt. Ambulates with significant forward flexed posture/ heavy forearm support.  No c/o back pain at this time and during supine there.ex.  Pt. Will return to more upright ex./ walking next tx. Session.  Pt. Will benefit from skilled PT services in combination with gym based ex. To improve LE strengthening/ pain-free mobility.    OBJECTIVE IMPAIRMENTS: Abnormal gait, decreased activity tolerance, decreased balance, decreased endurance, decreased mobility, difficulty walking, decreased ROM, decreased strength, hypomobility, increased edema, impaired flexibility, improper body mechanics, postural dysfunction, and pain.   ACTIVITY LIMITATIONS: bending, sitting, standing, squatting, stairs, transfers, bed mobility, toileting, and locomotion level  PARTICIPATION LIMITATIONS: medication management  PERSONAL FACTORS: Fitness and Past/current experiences are also affecting patient's functional outcome.   REHAB POTENTIAL: Good  CLINICAL  DECISION MAKING: Evolving/moderate complexity  EVALUATION COMPLEXITY: Moderate   GOALS: Goals reviewed with patient? Yes  SHORT TERM GOALS: Target date: 01/08/23  Pt. Will be independent with HEP to increase B LE muscle strength 1/2 muscle grade to improve transfer/ safety with walking.  Baseline:  see above Goal status: INITIAL  2.  Pt. Will complete with no rest breaks to improve walking endurance.   Baseline: TBD Goal status: INITIAL   LONG TERM GOALS: Target date: 02/12/23  Pt. Will increase FOTO to 51 to improve pain-free functional mobility.  Baseline:  initial 40 Goal status: INITIAL  2.  Pt. Able to stand from 21# chair height with no UE assist or low back pain to improve transfers/ independence with daily activity.   Baseline: benefits from armrests/ UE assist.  Goal status: INITIAL  3.  Pt. Able to return to gym based ex. Program with no limitations or c/o low back pain.   Baseline: currently not exercising or going to gym. Goal status: INITIAL   PLAN:  PT FREQUENCY: 2x/week  PT DURATION: 8 weeks  PLANNED INTERVENTIONS: Therapeutic exercises, Therapeutic activity, Neuromuscular re-education, Balance training, Gait training, Patient/Family education, Self Care, Joint mobilization, DME instructions, Dry Needling, Electrical stimulation, Cryotherapy, Moist heat, and Manual therapy.  PLAN FOR NEXT SESSION: Issue HEP   Cammie Mcgee, PT, DPT # [phone] 12/25/2022, 9:35 AM

## 2022-12-27 ENCOUNTER — Ambulatory Visit: Payer: Medicare HMO | Admitting: Physical Therapy

## 2023-01-01 ENCOUNTER — Ambulatory Visit: Payer: Medicare HMO | Admitting: Physical Therapy

## 2023-01-01 DIAGNOSIS — R293 Abnormal posture: Secondary | ICD-10-CM | POA: Diagnosis not present

## 2023-01-01 DIAGNOSIS — M6281 Muscle weakness (generalized): Secondary | ICD-10-CM

## 2023-01-01 DIAGNOSIS — M5459 Other low back pain: Secondary | ICD-10-CM | POA: Diagnosis not present

## 2023-01-01 DIAGNOSIS — R2689 Other abnormalities of gait and mobility: Secondary | ICD-10-CM | POA: Diagnosis not present

## 2023-01-01 DIAGNOSIS — R269 Unspecified abnormalities of gait and mobility: Secondary | ICD-10-CM

## 2023-01-01 DIAGNOSIS — M4854XA Collapsed vertebra, not elsewhere classified, thoracic region, initial encounter for fracture: Secondary | ICD-10-CM | POA: Diagnosis not present

## 2023-01-01 DIAGNOSIS — M5489 Other dorsalgia: Secondary | ICD-10-CM | POA: Diagnosis not present

## 2023-01-01 DIAGNOSIS — R29890 Loss of height: Secondary | ICD-10-CM | POA: Diagnosis not present

## 2023-01-01 DIAGNOSIS — M546 Pain in thoracic spine: Secondary | ICD-10-CM | POA: Diagnosis not present

## 2023-01-02 ENCOUNTER — Telehealth: Payer: Self-pay

## 2023-01-02 NOTE — Telephone Encounter (Signed)
Trospium chloride ER 60mg  was approved with a PA. Patient states she is not taking the medication due to it causing diarrhea. From her notes in care everywhere she has tried and failed both mirabegron and oxybutynin. Sheralyn Boatman will call back with other medications she has tried and failed in the past.

## 2023-01-03 ENCOUNTER — Ambulatory Visit: Payer: Medicare HMO | Admitting: Physical Therapy

## 2023-01-03 ENCOUNTER — Encounter: Payer: Self-pay | Admitting: Physical Therapy

## 2023-01-03 DIAGNOSIS — R2689 Other abnormalities of gait and mobility: Secondary | ICD-10-CM | POA: Diagnosis not present

## 2023-01-03 DIAGNOSIS — M6281 Muscle weakness (generalized): Secondary | ICD-10-CM | POA: Diagnosis not present

## 2023-01-03 DIAGNOSIS — R293 Abnormal posture: Secondary | ICD-10-CM

## 2023-01-03 DIAGNOSIS — M5459 Other low back pain: Secondary | ICD-10-CM | POA: Diagnosis not present

## 2023-01-03 DIAGNOSIS — R269 Unspecified abnormalities of gait and mobility: Secondary | ICD-10-CM

## 2023-01-03 NOTE — Therapy (Signed)
OUTPATIENT PHYSICAL THERAPY THORACOLUMBAR TREATMENT   Patient Name: Hannah Powell MRN: [id number] DOB:1945-11-08, 77 y.o., female Today's Date: 01/01/2023  END OF SESSION:  PT End of Session - 01/03/23 0837     Visit Number 4    Number of Visits 16    Date for PT Re-Evaluation 02/12/23    PT Start Time 0952    PT Stop Time 1037    PT Time Calculation (min) 45 min    Activity Tolerance Patient tolerated treatment well    Behavior During Therapy [facility] for tasks assessed/performed             Past Medical History:  Diagnosis Date   Anxiety    Arthritis    CHF (congestive heart failure) (HCC)    COPD (chronic obstructive pulmonary disease) (HCC)    Dysplastic nevus 04/22/2019   R abdomen parallel to sup umbilicus - mod   Dysplastic nevus 04/22/2019   R low back above waistline - mild    Edema    feet/legs   Hypertension    dr Darryll Capers      Hypothyroidism    Leg weakness, bilateral    Osteoporosis    Oxygen deficit    2l with bipap at night   RLS (restless legs syndrome)    Shortness of breath    Sleep apnea    bipap   Wheezing    Past Surgical History:  Procedure Laterality Date   BACK SURGERY     cervical   BREAST BIOPSY Left    needle bx-neg   CATARACT EXTRACTION W/PHACO Left 05/07/2017   Procedure: CATARACT EXTRACTION PHACO AND INTRAOCULAR LENS PLACEMENT (IOC);  Surgeon: Galen Manila, MD;  Location: [facility] ORS;  Service: Ophthalmology;  Laterality: Left;  [place] 00:48.7 AP% 11.3 CDE 5.46 Fluid Pack Lot # [id number] H   CATARACT EXTRACTION W/PHACO Right 04/23/2017   Procedure: CATARACT EXTRACTION PHACO AND INTRAOCULAR LENS PLACEMENT (IOC);  Surgeon: Galen Manila, MD;  Location: [facility] OR[facility];  Service: Ophthalmology;  Laterality: Right;  [place] 00:29 AP% 12.8 CDE 3.79 FLUID PACK LOT # [id number] H   CESAREAN SECTION     FOOT ARTHROPLASTY     JOINT REPLACEMENT     x2 tkr   TOTAL SHOULDER ARTHROPLASTY Left 07/02/2013   Procedure: LEFT TOTAL SHOULDER  ARTHROPLASTY;  Surgeon: Senaida Lange, MD;  Location: [facility];  Service: Orthopedics;  Laterality: Left;   TOTAL SHOULDER ARTHROPLASTY Right 04/10/2018   Procedure: TOTAL REVERSE SHOULDER ARTHROPLASTY;  Surgeon: Francena Hanly, MD;  Location: [facility];  Service: Orthopedics;  Laterality: Right;    Patient Active Problem List   Diagnosis Date Noted   Bilateral leg pain 08/29/2022   Cervical radiculopathy 08/28/2022   CHF exacerbation (HCC) 08/26/2022   Swelling of lower extremity 08/26/2022   Acute on chronic respiratory failure with hypoxia and hypercapnia (HCC) 03/29/2022   Chronic diastolic CHF (congestive heart failure) (HCC) 03/29/2022   Cellulitis of right lower extremity 03/29/2022   Restless leg syndrome 02/08/2022   Hypothyroidism 02/08/2022   Obesity (BMI 30-39.9) 02/08/2022   Insomnia 02/08/2022   Anxiety 02/08/2022   Poor appetite 02/08/2022   S/P reverse total shoulder arthroplasty, 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   Chronic diastolic heart failure (HCC) 08/09/2016   COPD (chronic obstructive pulmonary disease) (HCC) 08/09/2016   Pulmonary infiltrates    Pneumonia due to Streptococcus pneumoniae (HCC)    Fever    Community acquired pneumonia of right lung    Palliative care by specialist    Goals of  care, counseling/discussion    Pressure injury of skin 07/03/2016   Acute respiratory failure with hypoxia (HCC) 07/02/2016   Acute on chronic diastolic CHF (congestive heart failure) (HCC) 07/21/2015   COPD exacerbation (HCC) 07/21/2015   Acute renal insufficiency 07/21/2015   Hyperglycemia 07/21/2015   HTN (hypertension) 07/21/2015   S/P shoulder replacement 07/02/2013    PCP: Lynnea Ferrier, MD  REFERRING PROVIDER: Lynnea Ferrier, MD  REFERRING DIAG: Lumbar DDD, Neuropathy  Rationale for Evaluation and Treatment: Rehabilitation  THERAPY DIAG:  Other low back pain  Muscle weakness (generalized)  Gait difficulty  Abnormal posture  Imbalance  ONSET DATE:  03/20/2019  SUBJECTIVE:                                                                                                                                                                                           SUBJECTIVE STATEMENT: Pt. Reports chronic h/o lumbar stenosis/ back pain.  Pt. Reports 8/10 low back pain first thing in the morning and uses heating pad/ ibuprofen to decrease back pain to 1/10 after about 2 hours.  Pt. Reports no recent falls and using elevated RW for short distance ambulation and w/c for community and some household mobility.  Pt. States she has poor standing/walking endurance and is very slow with movement, which is why she requires use of w/c.  Increase episodes of COPD since last PT tx. Session.  Pt. Has 2 weddings to attend on 10/26 and 11/16.    PERTINENT HISTORY:  See MD notes.    PAIN:  Are you having pain? Yes: NPRS scale: 8/10 Pain location: low back Pain description: aching Aggravating factors: lying supine/ getting out of bed.  Relieving factors: moist heat  PRECAUTIONS: Fall  RED FLAGS: None   WEIGHT BEARING RESTRICTIONS: No  FALLS:  Has patient fallen in last 6 months? No  LIVING ENVIRONMENT: Lives with: lives with their spouse Lives in: House/apartment Stairs: No Has following equipment at home: Environmental consultant - 2 wheeled, Wheelchair (manual), and Tour manager  OCCUPATION: Retired  PLOF: Requires assistive device for independence  PATIENT GOALS: Increase standing/walking endurance.  Decrease low back pain.  NEXT MD VISIT: PRN  OBJECTIVE:  Note: Objective measures were completed at Evaluation unless otherwise noted.  DIAGNOSTIC FINDINGS:  COMPARISON:  Lumbar spine MRI from 06/26/2020   FINDINGS: Normal alignment. The vertebral body heights are well preserved. No signs of acute fracture or subluxation. Moderate multilevel disc space narrowing and anterior and posterior spur formation. Facet arthropathy identified within the lower lumbar  spine. Aortic atherosclerosis. Moderate stool burden noted within the colon.   IMPRESSION: 1. No  acute findings. 2. Moderate multilevel degenerative disc disease and facet arthropathy.  PATIENT SURVEYS:  FOTO initial 40/ goal 37  SCREENING FOR RED FLAGS: Bowel or bladder incontinence: yes Spinal tumors: No Cauda equina syndrome: No Compression fracture: No Abdominal aneurysm: No  COGNITION: Overall cognitive status: Within functional limits for tasks assessed     SENSATION: WFL, moderate lower leg swelling noted.  MUSCLE LENGTH: No supine positioning today Hamstrings: NT Thomas test: NT   POSTURE: rounded shoulders, forward head, increased thoracic kyphosis, and flexed trunk   PALPATION: Tenderness over lumbar paraspinals (generalized).  LUMBAR ROM:   AROM eval  Flexion 50% limited  Extension 100% limited  Right lateral flexion 50% limited  Left lateral flexion 50% limited  Right rotation 50% limited  Left rotation 50% limited   (Blank rows = not tested)  LOWER EXTREMITY ROM:       B hip flexion/ knee extension limited.    LOWER EXTREMITY MMT:       B LE muscle strength grossly 4/5 MMT except hamstring 4+/5 MMT and hip flexion 4-/5 MMT  Ascends steps with L LE and heavy UE assist.   LUMBAR SPECIAL TESTS:  Next tx. Session.  No supine positioning today.  FUNCTIONAL TESTS:  5 times sit to stand: 22.13 sec. With use of armrests.  Pt. Limited with STS from gray chair without UE assist.   6 minute walk test: TBD  GAIT: Distance walked: in clinic with elevated RW Assistive device utilized: Walker - 2 wheeled Level of assistance: Modified independence Comments: Significant forward head/ flexed posture/ thoracic kyphosis.  Slow, controlled gait pattern.  Fear of L knee buckling with R LE swing through phase of gait.   TODAY'S TREATMENT:                                                                                                                               DATE: 01/01/2023  Subjective:  Pt. Entered PT with use of RW and slow gait pattern. Pt. Requires extra time over door thresholds/ carpet in front of PT clinic. Pt. Reports no low back symptoms but c/o increase mid-upper back tenderness over spinous process.  Pt. has appt. this afternoon with MD to discuss pain. Pt. Missed last PT appt. Secondary to GI issues.  There.ex.:  Walking in clinic with use of elevated RW (cuing for recip. Gait pattern/ posture correction).    Seated marching/ LAQ 20x.  Palpation of upper/mid-back paraspinals and spinous process.  Tenderness over T5 region.   Seated Nautilus lat. Pull downs/ static shoulder flexion 20x (60#).  Focus on upright posture/ proper head position.  Standing Nautilus lat. Pull downs 10x (challenged with balance).    Walking in //-bars (forward/ backwards)- 2x.  Banker.  Nustep L4 10 min. B UE/LE (consistent cadence)- MH to back.      PATIENT EDUCATION:  Education details: Daily walking/ posture Person educated: Patient Education method: Medical illustrator Education comprehension:  verbalized understanding and returned demonstration  HOME EXERCISE PROGRAM: Will issue next tx. session  ASSESSMENT:  CLINICAL IMPRESSION: Pt. Ambulates with significant forward flexed posture/ heavy forearm support.  Pt. Ambulates with slow, focused recip. Gait pattern while using RW due to knee instability with wt. Bearing.   No c/o back pain at this time but point tenderness over T5 spinous process.  Pt. Will benefit from skilled PT services in combination with gym based ex. To improve LE strengthening/ pain-free mobility.    OBJECTIVE IMPAIRMENTS: Abnormal gait, decreased activity tolerance, decreased balance, decreased endurance, decreased mobility, difficulty walking, decreased ROM, decreased strength, hypomobility, increased edema, impaired flexibility, improper body mechanics, postural dysfunction, and pain.   ACTIVITY  LIMITATIONS: bending, sitting, standing, squatting, stairs, transfers, bed mobility, toileting, and locomotion level  PARTICIPATION LIMITATIONS: medication management  PERSONAL FACTORS: Fitness and Past/current experiences are also affecting patient's functional outcome.   REHAB POTENTIAL: Good  CLINICAL DECISION MAKING: Evolving/moderate complexity  EVALUATION COMPLEXITY: Moderate   GOALS: Goals reviewed with patient? Yes  SHORT TERM GOALS: Target date: 01/08/23  Pt. Will be independent with HEP to increase B LE muscle strength 1/2 muscle grade to improve transfer/ safety with walking.  Baseline:  see above Goal status: INITIAL  2.  Pt. Will complete with no rest breaks to improve walking endurance.   Baseline: TBD Goal status: INITIAL   LONG TERM GOALS: Target date: 02/12/23  Pt. Will increase FOTO to 51 to improve pain-free functional mobility.  Baseline:  initial 40 Goal status: INITIAL  2.  Pt. Able to stand from 21# chair height with no UE assist or low back pain to improve transfers/ independence with daily activity.   Baseline: benefits from armrests/ UE assist.  Goal status: INITIAL  3.  Pt. Able to return to gym based ex. Program with no limitations or c/o low back pain.   Baseline: currently not exercising or going to gym. Goal status: INITIAL   PLAN:  PT FREQUENCY: 2x/week  PT DURATION: 8 weeks  PLANNED INTERVENTIONS: Therapeutic exercises, Therapeutic activity, Neuromuscular re-education, Balance training, Gait training, Patient/Family education, Self Care, Joint mobilization, DME instructions, Dry Needling, Electrical stimulation, Cryotherapy, Moist heat, and Manual therapy.  PLAN FOR NEXT SESSION: Issue HEP   Cammie Mcgee, PT, DPT # [phone] 01/03/2023, 8:39 AM

## 2023-01-03 NOTE — Therapy (Signed)
OUTPATIENT PHYSICAL THERAPY THORACOLUMBAR TREATMENT   Patient Name: Caffie Gonsalez MRN: [id number] DOB:[birth year], 77 y.o., female Today's Date: 01/03/2023  END OF SESSION:  PT End of Session - 01/03/23 0940     Visit Number 5    Number of Visits 16    Date for PT Re-Evaluation 02/12/23    PT Start Time 0940    Activity Tolerance Patient tolerated treatment well    Behavior During Therapy [facility] for tasks assessed/performed            0940 to 1034  (54 minutes).    Past Medical History:  Diagnosis Date   Anxiety    Arthritis    CHF (congestive heart failure) (HCC)    COPD (chronic obstructive pulmonary disease) (HCC)    Dysplastic nevus 04/22/2019   R abdomen parallel to sup umbilicus - mod   Dysplastic nevus 04/22/2019   R low back above waistline - mild    Edema    feet/legs   Hypertension    dr Darryll Capers      Hypothyroidism    Leg weakness, bilateral    Osteoporosis    Oxygen deficit    2l with bipap at night   RLS (restless legs syndrome)    Shortness of breath    Sleep apnea    bipap   Wheezing    Past Surgical History:  Procedure Laterality Date   BACK SURGERY     cervical   BREAST BIOPSY Left    needle bx-neg   CATARACT EXTRACTION W/PHACO Left 05/07/2017   Procedure: CATARACT EXTRACTION PHACO AND INTRAOCULAR LENS PLACEMENT (IOC);  Surgeon: Galen Manila, MD;  Location: [facility] ORS;  Service: Ophthalmology;  Laterality: Left;  [place] 00:48.7 AP% 11.3 CDE 5.46 Fluid Pack Lot # [id number]   CATARACT EXTRACTION W/PHACO Right 04/23/2017   Procedure: CATARACT EXTRACTION PHACO AND INTRAOCULAR LENS PLACEMENT (IOC);  Surgeon: Galen Manila, MD;  Location: [facility] OR[facility];  Service: Ophthalmology;  Laterality: Right;  [place] 00:29 AP% 12.8 CDE 3.79 FLUID PACK LOT # [id number]   CESAREAN SECTION     FOOT ARTHROPLASTY     JOINT REPLACEMENT     x2 tkr   TOTAL SHOULDER ARTHROPLASTY Left 07/02/2013   Procedure: LEFT TOTAL SHOULDER ARTHROPLASTY;  Surgeon: Senaida Lange, MD;  Location: [facility];  Service: Orthopedics;  Laterality: Left;   TOTAL SHOULDER ARTHROPLASTY Right 04/10/2018   Procedure: TOTAL REVERSE SHOULDER ARTHROPLASTY;  Surgeon: Francena Hanly, MD;  Location: [facility];  Service: Orthopedics;  Laterality: Right;    Patient Active Problem List   Diagnosis Date Noted   Bilateral leg pain 08/29/2022   Cervical radiculopathy 08/28/2022   CHF exacerbation (HCC) 08/26/2022   Swelling of lower extremity 08/26/2022   Acute on chronic respiratory failure with hypoxia and hypercapnia (HCC) 03/29/2022   Chronic diastolic CHF (congestive heart failure) (HCC) 03/29/2022   Cellulitis of right lower extremity 03/29/2022   Restless leg syndrome 02/08/2022   Hypothyroidism 02/08/2022   Obesity (BMI 30-39.9) 02/08/2022   Insomnia 02/08/2022   Anxiety 02/08/2022   Poor appetite 02/08/2022   S/P reverse total shoulder arthroplasty, 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   Chronic diastolic heart failure (HCC) 08/09/2016   COPD (chronic obstructive pulmonary disease) (HCC) 08/09/2016   Pulmonary infiltrates    Pneumonia due to Streptococcus pneumoniae (HCC)    Fever    Community acquired pneumonia of right lung    Palliative care by specialist    Goals of care, counseling/discussion    Pressure injury of  skin 07/03/2016   Acute respiratory failure with hypoxia (HCC) 07/02/2016   Acute on chronic diastolic CHF (congestive heart failure) (HCC) 07/21/2015   COPD exacerbation (HCC) 07/21/2015   Acute renal insufficiency 07/21/2015   Hyperglycemia 07/21/2015   HTN (hypertension) 07/21/2015   S/P shoulder replacement 07/02/2013    PCP: Lynnea Ferrier, MD  REFERRING PROVIDER: Lynnea Ferrier, MD  REFERRING DIAG: Lumbar DDD, Neuropathy  Rationale for Evaluation and Treatment: Rehabilitation  THERAPY DIAG:  Other low back pain  Muscle weakness (generalized)  Gait difficulty  Abnormal posture  Imbalance  ONSET DATE: 03/20/2019  SUBJECTIVE:                                                                                                                                                                                            SUBJECTIVE STATEMENT: Pt. Reports chronic h/o lumbar stenosis/ back pain.  Pt. Reports 8/10 low back pain first thing in the morning and uses heating pad/ ibuprofen to decrease back pain to 1/10 after about 2 hours.  Pt. Reports no recent falls and using elevated RW for short distance ambulation and w/c for community and some household mobility.  Pt. States she has poor standing/walking endurance and is very slow with movement, which is why she requires use of w/c.  Increase episodes of COPD since last PT tx. Session.  Pt. Has 2 weddings to attend on 10/26 and 11/16.    PERTINENT HISTORY:  See MD notes.    PAIN:  Are you having pain? Yes: NPRS scale: 8/10 Pain location: low back Pain description: aching Aggravating factors: lying supine/ getting out of bed.  Relieving factors: moist heat  PRECAUTIONS: Fall  RED FLAGS: None   WEIGHT BEARING RESTRICTIONS: No  FALLS:  Has patient fallen in last 6 months? No  LIVING ENVIRONMENT: Lives with: lives with their spouse Lives in: House/apartment Stairs: No Has following equipment at home: Environmental consultant - 2 wheeled, Wheelchair (manual), and Tour manager  OCCUPATION: Retired  PLOF: Requires assistive device for independence  PATIENT GOALS: Increase standing/walking endurance.  Decrease low back pain.  NEXT MD VISIT: PRN  OBJECTIVE:  Note: Objective measures were completed at Evaluation unless otherwise noted.  DIAGNOSTIC FINDINGS:  COMPARISON:  Lumbar spine MRI from 06/26/2020   FINDINGS: Normal alignment. The vertebral body heights are well preserved. No signs of acute fracture or subluxation. Moderate multilevel disc space narrowing and anterior and posterior spur formation. Facet arthropathy identified within the lower lumbar spine. Aortic atherosclerosis.  Moderate stool burden noted within the colon.   IMPRESSION: 1. No acute findings. 2. Moderate multilevel degenerative disc disease  and facet arthropathy.  PATIENT SURVEYS:  FOTO initial 40/ goal 80  SCREENING FOR RED FLAGS: Bowel or bladder incontinence: yes Spinal tumors: No Cauda equina syndrome: No Compression fracture: No Abdominal aneurysm: No  COGNITION: Overall cognitive status: Within functional limits for tasks assessed     SENSATION: WFL, moderate lower leg swelling noted.  MUSCLE LENGTH: No supine positioning today Hamstrings: NT Thomas test: NT   POSTURE: rounded shoulders, forward head, increased thoracic kyphosis, and flexed trunk   PALPATION: Tenderness over lumbar paraspinals (generalized).  LUMBAR ROM:   AROM eval  Flexion 50% limited  Extension 100% limited  Right lateral flexion 50% limited  Left lateral flexion 50% limited  Right rotation 50% limited  Left rotation 50% limited   (Blank rows = not tested)  LOWER EXTREMITY ROM:       B hip flexion/ knee extension limited.    LOWER EXTREMITY MMT:       B LE muscle strength grossly 4/5 MMT except hamstring 4+/5 MMT and hip flexion 4-/5 MMT  Ascends steps with L LE and heavy UE assist.   LUMBAR SPECIAL TESTS:  Next tx. Session.  No supine positioning today.  FUNCTIONAL TESTS:  5 times sit to stand: 22.13 sec. With use of armrests.  Pt. Limited with STS from gray chair without UE assist.   6 minute walk test: TBD  GAIT: Distance walked: in clinic with elevated RW Assistive device utilized: Walker - 2 wheeled Level of assistance: Modified independence Comments: Significant forward head/ flexed posture/ thoracic kyphosis.  Slow, controlled gait pattern.  Fear of L knee buckling with R LE swing through phase of gait.   TODAY'S TREATMENT:                                                                                                                              DATE:  01/03/2023  Subjective:  Pt. Reports being upset and tired today due to family news.  Pt. Did not attend gym yesterday.  Pt. Had f/u with MD about mid-back pain and had an x-ray (no results posted yet).    There.ex.:  Nustep L4 10 min. B UE/LE (consistent cadence)- MH to back.   PT discussed family situation.    Seated marching/ LAQ/ ankle pumps (at mat table)- 20x each.  Supine on mat table: glut. Sets/ SAQ with green bolster/ SLR/ shoulder flexion and abduction (no wt.)- 20x each.    Sit to stands from varying heights of mat table into elevated RW.    Walked from PT tx. Room down hallway to side of building/ parking lot.  Pt. Requires SBA with descending curbs while using RW.  No LOB or knee buckling.  Extra time required and cuing to increase recip. Step pattern.     PATIENT EDUCATION:  Education details: Daily walking/ posture Person educated: Patient Education method: Medical illustrator Education comprehension: verbalized understanding and returned demonstration  HOME EXERCISE PROGRAM: Will issue next  tx. session  ASSESSMENT:  CLINICAL IMPRESSION: Pt. Ambulates with significant forward flexed posture/ heavy forearm support.  Pt. Ambulates with slow, focused recip. Gait pattern while using RW due to knee instability with wt. Bearing.   Increase mid-back pain reported today and pt. Feeling down due to family situation.   Pt. Will benefit from skilled PT services in combination with gym based ex. To improve LE strengthening/ pain-free mobility.    OBJECTIVE IMPAIRMENTS: Abnormal gait, decreased activity tolerance, decreased balance, decreased endurance, decreased mobility, difficulty walking, decreased ROM, decreased strength, hypomobility, increased edema, impaired flexibility, improper body mechanics, postural dysfunction, and pain.   ACTIVITY LIMITATIONS: bending, sitting, standing, squatting, stairs, transfers, bed mobility, toileting, and locomotion  level  PARTICIPATION LIMITATIONS: medication management  PERSONAL FACTORS: Fitness and Past/current experiences are also affecting patient's functional outcome.   REHAB POTENTIAL: Good  CLINICAL DECISION MAKING: Evolving/moderate complexity  EVALUATION COMPLEXITY: Moderate   GOALS: Goals reviewed with patient? Yes  SHORT TERM GOALS: Target date: 01/08/23  Pt. Will be independent with HEP to increase B LE muscle strength 1/2 muscle grade to improve transfer/ safety with walking.  Baseline:  see above Goal status: INITIAL  2.  Pt. Will complete with no rest breaks to improve walking endurance.   Baseline: TBD Goal status: INITIAL   LONG TERM GOALS: Target date: 02/12/23  Pt. Will increase FOTO to 51 to improve pain-free functional mobility.  Baseline:  initial 40 Goal status: INITIAL  2.  Pt. Able to stand from 21# chair height with no UE assist or low back pain to improve transfers/ independence with daily activity.   Baseline: benefits from armrests/ UE assist.  Goal status: INITIAL  3.  Pt. Able to return to gym based ex. Program with no limitations or c/o low back pain.   Baseline: currently not exercising or going to gym. Goal status: INITIAL   PLAN:  PT FREQUENCY: 2x/week  PT DURATION: 8 weeks  PLANNED INTERVENTIONS: Therapeutic exercises, Therapeutic activity, Neuromuscular re-education, Balance training, Gait training, Patient/Family education, Self Care, Joint mobilization, DME instructions, Dry Needling, Electrical stimulation, Cryotherapy, Moist heat, and Manual therapy.  PLAN FOR NEXT SESSION: Issue HEP.  Discuss x-ray results/ check STGs   Cammie Mcgee, PT, DPT # [phone] 01/03/2023, 9:41 AM

## 2023-01-04 NOTE — Telephone Encounter (Signed)
Hannah Powell called and has provided an additional medication as Trospium and this caused her to have diarrhea. Please advise on alternative treatment.

## 2023-01-08 ENCOUNTER — Encounter: Payer: Self-pay | Admitting: Physical Therapy

## 2023-01-08 ENCOUNTER — Ambulatory Visit: Payer: Medicare HMO | Admitting: Physical Therapy

## 2023-01-08 DIAGNOSIS — M6281 Muscle weakness (generalized): Secondary | ICD-10-CM

## 2023-01-08 DIAGNOSIS — R293 Abnormal posture: Secondary | ICD-10-CM

## 2023-01-08 DIAGNOSIS — R269 Unspecified abnormalities of gait and mobility: Secondary | ICD-10-CM | POA: Diagnosis not present

## 2023-01-08 DIAGNOSIS — R2689 Other abnormalities of gait and mobility: Secondary | ICD-10-CM | POA: Diagnosis not present

## 2023-01-08 DIAGNOSIS — M5459 Other low back pain: Secondary | ICD-10-CM

## 2023-01-08 NOTE — Telephone Encounter (Signed)
I spoke to [place] over the phone. She agrees with the current plan of treatment, she will add the fiber in to her daily routine. Rx should be on file with [facility]. Sheralyn Boatman has not heard from PT as of yet.

## 2023-01-08 NOTE — Therapy (Signed)
OUTPATIENT PHYSICAL THERAPY THORACOLUMBAR TREATMENT  Patient Name: Hannah Powell MRN: [id number] DOB:Dec 05, 1945, 77 y.o., female Today's Date: 01/08/2023  END OF SESSION:  PT End of Session - 01/08/23 1003     Visit Number 6    Number of Visits 16    Date for PT Re-Evaluation 02/12/23    PT Start Time 1003    PT Stop Time 1042    PT Time Calculation (min) 39 min    Activity Tolerance Patient tolerated treatment well    Behavior During Therapy WFL for tasks assessed/performed             Past Medical History:  Diagnosis Date   Anxiety    Arthritis    CHF (congestive heart failure) (HCC)    COPD (chronic obstructive pulmonary disease) (HCC)    Dysplastic nevus 04/22/2019   R abdomen parallel to sup umbilicus - mod   Dysplastic nevus 04/22/2019   R low back above waistline - mild    Edema    feet/legs   Hypertension    dr Darryll Capers      Hypothyroidism    Leg weakness, bilateral    Osteoporosis    Oxygen deficit    2l with bipap at night   RLS (restless legs syndrome)    Shortness of breath    Sleep apnea    bipap   Wheezing    Past Surgical History:  Procedure Laterality Date   BACK SURGERY     cervical   BREAST BIOPSY Left    needle bx-neg   CATARACT EXTRACTION W/PHACO Left 05/07/2017   Procedure: CATARACT EXTRACTION PHACO AND INTRAOCULAR LENS PLACEMENT (IOC);  Surgeon: Galen Manila, MD;  Location: [facility];  Service: Ophthalmology;  Laterality: Left;  [place] 00:48.7 AP% 11.3 CDE 5.46 Fluid Pack Lot # [id number] H   CATARACT EXTRACTION W/PHACO Right 04/23/2017   Procedure: CATARACT EXTRACTION PHACO AND INTRAOCULAR LENS PLACEMENT (IOC);  Surgeon: Galen Manila, MD;  Location: [facility] OR[facility];  Service: Ophthalmology;  Laterality: Right;  [place] 00:29 AP% 12.8 CDE 3.79 FLUID PACK LOT # [id number]   CESAREAN SECTION     FOOT ARTHROPLASTY     JOINT REPLACEMENT     x2 tkr   TOTAL SHOULDER ARTHROPLASTY Left 07/02/2013   Procedure: LEFT TOTAL SHOULDER  ARTHROPLASTY;  Surgeon: Senaida Lange, MD;  Location: [facility];  Service: Orthopedics;  Laterality: Left;   TOTAL SHOULDER ARTHROPLASTY Right 04/10/2018   Procedure: TOTAL REVERSE SHOULDER ARTHROPLASTY;  Surgeon: Francena Hanly, MD;  Location: [facility];  Service: Orthopedics;  Laterality: Right;    Patient Active Problem List   Diagnosis Date Noted   Bilateral leg pain 08/29/2022   Cervical radiculopathy 08/28/2022   CHF exacerbation (HCC) 08/26/2022   Swelling of lower extremity 08/26/2022   Acute on chronic respiratory failure with hypoxia and hypercapnia (HCC) 03/29/2022   Chronic diastolic CHF (congestive heart failure) (HCC) 03/29/2022   Cellulitis of right lower extremity 03/29/2022   Restless leg syndrome 02/08/2022   Hypothyroidism 02/08/2022   Obesity (BMI 30-39.9) 02/08/2022   Insomnia 02/08/2022   Anxiety 02/08/2022   Poor appetite 02/08/2022   S/P reverse total shoulder arthroplasty, 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   Chronic diastolic heart failure (HCC) 08/09/2016   COPD (chronic obstructive pulmonary disease) (HCC) 08/09/2016   Pulmonary infiltrates    Pneumonia due to Streptococcus pneumoniae (HCC)    Fever    Community acquired pneumonia of right lung    Palliative care by specialist    Goals of care,  counseling/discussion    Pressure injury of skin 07/03/2016   Acute respiratory failure with hypoxia (HCC) 07/02/2016   Acute on chronic diastolic CHF (congestive heart failure) (HCC) 07/21/2015   COPD exacerbation (HCC) 07/21/2015   Acute renal insufficiency 07/21/2015   Hyperglycemia 07/21/2015   HTN (hypertension) 07/21/2015   S/P shoulder replacement 07/02/2013    PCP: Lynnea Ferrier, MD  REFERRING PROVIDER: Lynnea Ferrier, MD  REFERRING DIAG: Lumbar DDD, Neuropathy  Rationale for Evaluation and Treatment: Rehabilitation  THERAPY DIAG:  Other low back pain  Muscle weakness (generalized)  Gait difficulty  Abnormal posture  Imbalance  ONSET DATE:  03/20/2019  SUBJECTIVE:                                                                                                                                                                                           SUBJECTIVE STATEMENT: Pt. Reports chronic h/o lumbar stenosis/ back pain.  Pt. Reports 8/10 low back pain first thing in the morning and uses heating pad/ ibuprofen to decrease back pain to 1/10 after about 2 hours.  Pt. Reports no recent falls and using elevated RW for short distance ambulation and w/c for community and some household mobility.  Pt. States she has poor standing/walking endurance and is very slow with movement, which is why she requires use of w/c.  Increase episodes of COPD since last PT tx. Session.  Pt. Has 2 weddings to attend on 10/26 and 11/16.    PERTINENT HISTORY:  See MD notes.    PAIN:  Are you having pain? Yes: NPRS scale: 8/10 Pain location: low back Pain description: aching Aggravating factors: lying supine/ getting out of bed.  Relieving factors: moist heat  PRECAUTIONS: Fall  RED FLAGS: None   WEIGHT BEARING RESTRICTIONS: No  FALLS:  Has patient fallen in last 6 months? No  LIVING ENVIRONMENT: Lives with: lives with their spouse Lives in: House/apartment Stairs: No Has following equipment at home: Environmental consultant - 2 wheeled, Wheelchair (manual), and Tour manager  OCCUPATION: Retired  PLOF: Requires assistive device for independence  PATIENT GOALS: Increase standing/walking endurance.  Decrease low back pain.  NEXT MD VISIT: PRN  OBJECTIVE:  Note: Objective measures were completed at Evaluation unless otherwise noted.  DIAGNOSTIC FINDINGS:  COMPARISON:  Lumbar spine MRI from 06/26/2020   FINDINGS: Normal alignment. The vertebral body heights are well preserved. No signs of acute fracture or subluxation. Moderate multilevel disc space narrowing and anterior and posterior spur formation. Facet arthropathy identified within the lower lumbar  spine. Aortic atherosclerosis. Moderate stool burden noted within the colon.   IMPRESSION: 1. No acute  findings. 2. Moderate multilevel degenerative disc disease and facet arthropathy.  PATIENT SURVEYS:  FOTO initial 40/ goal 31  SCREENING FOR RED FLAGS: Bowel or bladder incontinence: yes Spinal tumors: No Cauda equina syndrome: No Compression fracture: No Abdominal aneurysm: No  COGNITION: Overall cognitive status: Within functional limits for tasks assessed     SENSATION: WFL, moderate lower leg swelling noted.  MUSCLE LENGTH: No supine positioning today Hamstrings: NT Thomas test: NT   POSTURE: rounded shoulders, forward head, increased thoracic kyphosis, and flexed trunk   PALPATION: Tenderness over lumbar paraspinals (generalized).  LUMBAR ROM:   AROM eval  Flexion 50% limited  Extension 100% limited  Right lateral flexion 50% limited  Left lateral flexion 50% limited  Right rotation 50% limited  Left rotation 50% limited   (Blank rows = not tested)  LOWER EXTREMITY ROM:       B hip flexion/ knee extension limited.    LOWER EXTREMITY MMT:       B LE muscle strength grossly 4/5 MMT except hamstring 4+/5 MMT and hip flexion 4-/5 MMT  Ascends steps with L LE and heavy UE assist.   LUMBAR SPECIAL TESTS:  Next tx. Session.  No supine positioning today.  FUNCTIONAL TESTS:  5 times sit to stand: 22.13 sec. With use of armrests.  Pt. Limited with STS from gray chair without UE assist.   6 minute walk test: TBD  GAIT: Distance walked: in clinic with elevated RW Assistive device utilized: Walker - 2 wheeled Level of assistance: Modified independence Comments: Significant forward head/ flexed posture/ thoracic kyphosis.  Slow, controlled gait pattern.  Fear of L knee buckling with R LE swing through phase of gait.   TODAY'S TREATMENT:                                                                                                                               DATE: 01/08/2023  Subjective:  Pt. has not slept well recently due to family concerns.  Pt. Has a busy weekend coming up with granddaughter's wedding.  Pt. C/o mid-back pain and X-ray revealed a severe wedge compression fracture at T7 (unknown POC at this time).    There.ex.:  Nustep L4 10 min. B UE/LE (consistent cadence)- MH to back.   PT discussed family situation/ upcoming wedding weekend.   Seated marching/ LAQ/ ankle pumps (gray chair) 20x each.  Sit to stands from gray chair into elevated RW.  Pt. Requires use of UE on armrests today.     Walking in //-bars: forward/ backwards/ lateral walking 2x each.  Cuing to increase cadence/ recip. Pattern.    Seated B shoulder flexion/ posture correction ex./ movement pattern.   PATIENT EDUCATION:  Education details: Daily walking/ posture Person educated: Patient Education method: Medical illustrator Education comprehension: verbalized understanding and returned demonstration  HOME EXERCISE PROGRAM: Will issue next tx. session  ASSESSMENT:  CLINICAL IMPRESSION: Pt. Ambulates with significant forward flexed posture/ heavy  forearm support.  Pt. Ambulates with slow, focused recip. Gait pattern while using RW due to knee instability with wt. Bearing.   Increase mid-back pain reported today and pt. Feeling down due to family situation.   No plan of care reported per pt. And pt. Has not heard back from MD office.  Pt. Will benefit from skilled PT services in combination with gym based ex. To improve LE strengthening/ pain-free mobility.    OBJECTIVE IMPAIRMENTS: Abnormal gait, decreased activity tolerance, decreased balance, decreased endurance, decreased mobility, difficulty walking, decreased ROM, decreased strength, hypomobility, increased edema, impaired flexibility, improper body mechanics, postural dysfunction, and pain.   ACTIVITY LIMITATIONS: bending, sitting, standing, squatting, stairs, transfers, bed mobility, toileting,  and locomotion level  PARTICIPATION LIMITATIONS: medication management  PERSONAL FACTORS: Fitness and Past/current experiences are also affecting patient's functional outcome.   REHAB POTENTIAL: Good  CLINICAL DECISION MAKING: Evolving/moderate complexity  EVALUATION COMPLEXITY: Moderate   GOALS: Goals reviewed with patient? Yes  SHORT TERM GOALS: Target date: 01/08/23  Pt. Will be independent with HEP to increase B LE muscle strength 1/2 muscle grade to improve transfer/ safety with walking.  Baseline:  see above Goal status: Not met  2.  Pt. Will complete with no rest breaks to improve walking endurance.   Baseline: TBD.  10/22: not completed at this time secondary to increase mid-back pain.   Goal status: On-going   LONG TERM GOALS: Target date: 02/12/23  Pt. Will increase FOTO to 51 to improve pain-free functional mobility.  Baseline:  initial 40 Goal status: INITIAL  2.  Pt. Able to stand from 21# chair height with no UE assist or low back pain to improve transfers/ independence with daily activity.   Baseline: benefits from armrests/ UE assist.  Goal status: INITIAL  3.  Pt. Able to return to gym based ex. Program with no limitations or c/o low back pain.   Baseline: currently not exercising or going to gym. Goal status: INITIAL   PLAN:  PT FREQUENCY: 2x/week  PT DURATION: 8 weeks  PLANNED INTERVENTIONS: Therapeutic exercises, Therapeutic activity, Neuromuscular re-education, Balance training, Gait training, Patient/Family education, Self Care, Joint mobilization, DME instructions, Dry Needling, Electrical stimulation, Cryotherapy, Moist heat, and Manual therapy.  PLAN FOR NEXT SESSION: Discuss activity/pain at wedding   Cammie Mcgee, PT, DPT # [phone] 01/08/2023, 11:21 AM

## 2023-01-09 NOTE — Telephone Encounter (Signed)
Patient has been provided with the phone number to call PT at [phone]

## 2023-01-10 ENCOUNTER — Encounter: Payer: Medicare HMO | Admitting: Physical Therapy

## 2023-01-15 ENCOUNTER — Ambulatory Visit: Payer: Medicare HMO | Admitting: Physical Therapy

## 2023-01-17 ENCOUNTER — Ambulatory Visit: Payer: Medicare HMO

## 2023-01-17 ENCOUNTER — Encounter: Payer: Self-pay | Admitting: Physical Therapy

## 2023-01-17 DIAGNOSIS — R2689 Other abnormalities of gait and mobility: Secondary | ICD-10-CM | POA: Diagnosis not present

## 2023-01-17 DIAGNOSIS — R269 Unspecified abnormalities of gait and mobility: Secondary | ICD-10-CM | POA: Diagnosis not present

## 2023-01-17 DIAGNOSIS — M5459 Other low back pain: Secondary | ICD-10-CM | POA: Diagnosis not present

## 2023-01-17 DIAGNOSIS — R293 Abnormal posture: Secondary | ICD-10-CM

## 2023-01-17 DIAGNOSIS — M6281 Muscle weakness (generalized): Secondary | ICD-10-CM | POA: Diagnosis not present

## 2023-01-17 NOTE — Therapy (Signed)
OUTPATIENT PHYSICAL THERAPY THORACOLUMBAR TREATMENT  Patient Name: Hannah Powell MRN: [id number] DOB:[birth year], 77 y.o., female Today's Date: 01/17/2023  END OF SESSION:  PT End of Session - 01/17/23 0958     Visit Number 7    Number of Visits 16    Date for PT Re-Evaluation 02/12/23    PT Start Time 0951    PT Stop Time 1035    PT Time Calculation (min) 44 min    Activity Tolerance Patient tolerated treatment well    Behavior During Therapy [facility] for tasks assessed/performed             Past Medical History:  Diagnosis Date   Anxiety    Arthritis    CHF (congestive heart failure) (HCC)    COPD (chronic obstructive pulmonary disease) (HCC)    Dysplastic nevus 04/22/2019   R abdomen parallel to sup umbilicus - mod   Dysplastic nevus 04/22/2019   R low back above waistline - mild    Edema    feet/legs   Hypertension    dr Darryll Capers      Hypothyroidism    Leg weakness, bilateral    Osteoporosis    Oxygen deficit    2l with bipap at night   RLS (restless legs syndrome)    Shortness of breath    Sleep apnea    bipap   Wheezing    Past Surgical History:  Procedure Laterality Date   BACK SURGERY     cervical   BREAST BIOPSY Left    needle bx-neg   CATARACT EXTRACTION W/PHACO Left 05/07/2017   Procedure: CATARACT EXTRACTION PHACO AND INTRAOCULAR LENS PLACEMENT (IOC);  Surgeon: Galen Manila, MD;  Location: [facility] ORS;  Service: Ophthalmology;  Laterality: Left;  [place] 00:48.7 AP% 11.3 CDE 5.46 Fluid Pack Lot # [id number]   CATARACT EXTRACTION W/PHACO Right 04/23/2017   Procedure: CATARACT EXTRACTION PHACO AND INTRAOCULAR LENS PLACEMENT (IOC);  Surgeon: Galen Manila, MD;  Location: [facility] ORS;  Service: Ophthalmology;  Laterality: Right;  [place] 00:29 AP% 12.8 CDE 3.79 FLUID PACK LOT # [id number]   CESAREAN SECTION     FOOT ARTHROPLASTY     JOINT REPLACEMENT     x2 tkr   TOTAL SHOULDER ARTHROPLASTY Left 07/02/2013   Procedure: LEFT TOTAL SHOULDER  ARTHROPLASTY;  Surgeon: Senaida Lange, MD;  Location: [facility] OR;  Service: Orthopedics;  Laterality: Left;   TOTAL SHOULDER ARTHROPLASTY Right 04/10/2018   Procedure: TOTAL REVERSE SHOULDER ARTHROPLASTY;  Surgeon: Francena Hanly, MD;  Location: [facility] OR[facility];  Service: Orthopedics;  Laterality: Right;    Patient Active Problem List   Diagnosis Date Noted   Bilateral leg pain 08/29/2022   Cervical radiculopathy 08/28/2022   CHF exacerbation (HCC) 08/26/2022   Swelling of lower extremity 08/26/2022   Acute on chronic respiratory failure with hypoxia and hypercapnia (HCC) 03/29/2022   Chronic diastolic CHF (congestive heart failure) (HCC) 03/29/2022   Cellulitis of right lower extremity 03/29/2022   Restless leg syndrome 02/08/2022   Hypothyroidism 02/08/2022   Obesity (BMI 30-39.9) 02/08/2022   Insomnia 02/08/2022   Anxiety 02/08/2022   Poor appetite 02/08/2022   S/P reverse total shoulder arthroplasty, 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   Chronic diastolic heart failure (HCC) 08/09/2016   COPD (chronic obstructive pulmonary disease) (HCC) 08/09/2016   Pulmonary infiltrates    Pneumonia due to Streptococcus pneumoniae (HCC)    Fever    Community acquired pneumonia of right lung    Palliative care by specialist    Goals of care,  counseling/discussion    Pressure injury of skin 07/03/2016   Acute respiratory failure with hypoxia (HCC) 07/02/2016   Acute on chronic diastolic CHF (congestive heart failure) (HCC) 07/21/2015   COPD exacerbation (HCC) 07/21/2015   Acute renal insufficiency 07/21/2015   Hyperglycemia 07/21/2015   HTN (hypertension) 07/21/2015   S/P shoulder replacement 07/02/2013    PCP: Lynnea Ferrier, MD  REFERRING PROVIDER: Lynnea Ferrier, MD  REFERRING DIAG: Lumbar DDD, Neuropathy  Rationale for Evaluation and Treatment: Rehabilitation  THERAPY DIAG:  Other low back pain  Muscle weakness (generalized)  Gait difficulty  Imbalance  Abnormal posture  ONSET DATE:  03/20/2019  SUBJECTIVE:                                                                                                                                                                                           SUBJECTIVE STATEMENT: Pt. Reports chronic h/o lumbar stenosis/ back pain.  Pt. Reports 8/10 low back pain first thing in the morning and uses heating pad/ ibuprofen to decrease back pain to 1/10 after about 2 hours.  Pt. Reports no recent falls and using elevated RW for short distance ambulation and w/c for community and some household mobility.  Pt. States she has poor standing/walking endurance and is very slow with movement, which is why she requires use of w/c.  Increase episodes of COPD since last PT tx. Session.  Pt. Has 2 weddings to attend on 10/26 and 11/16.    PERTINENT HISTORY:  See MD notes.    PAIN:  Are you having pain? Yes: NPRS scale: 8/10 Pain location: low back Pain description: aching Aggravating factors: lying supine/ getting out of bed.  Relieving factors: moist heat  PRECAUTIONS: Fall  RED FLAGS: None   WEIGHT BEARING RESTRICTIONS: No  FALLS:  Has patient fallen in last 6 months? No  LIVING ENVIRONMENT: Lives with: lives with their spouse Lives in: House/apartment Stairs: No Has following equipment at home: Environmental consultant - 2 wheeled, Wheelchair (manual), and Tour manager  OCCUPATION: Retired  PLOF: Requires assistive device for independence  PATIENT GOALS: Increase standing/walking endurance.  Decrease low back pain.  NEXT MD VISIT: PRN  OBJECTIVE:  Note: Objective measures were completed at Evaluation unless otherwise noted.  DIAGNOSTIC FINDINGS:  COMPARISON:  Lumbar spine MRI from 06/26/2020   FINDINGS: Normal alignment. The vertebral body heights are well preserved. No signs of acute fracture or subluxation. Moderate multilevel disc space narrowing and anterior and posterior spur formation. Facet arthropathy identified within the lower lumbar  spine. Aortic atherosclerosis. Moderate stool burden noted within the colon.   IMPRESSION: 1. No acute  findings. 2. Moderate multilevel degenerative disc disease and facet arthropathy.  PATIENT SURVEYS:  FOTO initial 40/ goal 32  SCREENING FOR RED FLAGS: Bowel or bladder incontinence: yes Spinal tumors: No Cauda equina syndrome: No Compression fracture: No Abdominal aneurysm: No  COGNITION: Overall cognitive status: Within functional limits for tasks assessed     SENSATION: WFL, moderate lower leg swelling noted.  MUSCLE LENGTH: No supine positioning today Hamstrings: NT Thomas test: NT   POSTURE: rounded shoulders, forward head, increased thoracic kyphosis, and flexed trunk   PALPATION: Tenderness over lumbar paraspinals (generalized).  LUMBAR ROM:   AROM eval  Flexion 50% limited  Extension 100% limited  Right lateral flexion 50% limited  Left lateral flexion 50% limited  Right rotation 50% limited  Left rotation 50% limited   (Blank rows = not tested)  LOWER EXTREMITY ROM:       B hip flexion/ knee extension limited.    LOWER EXTREMITY MMT:       B LE muscle strength grossly 4/5 MMT except hamstring 4+/5 MMT and hip flexion 4-/5 MMT  Ascends steps with L LE and heavy UE assist.   LUMBAR SPECIAL TESTS:  Next tx. Session.  No supine positioning today.  FUNCTIONAL TESTS:  5 times sit to stand: 22.13 sec. With use of armrests.  Pt. Limited with STS from gray chair without UE assist.   6 minute walk test: TBD  GAIT: Distance walked: in clinic with elevated RW Assistive device utilized: Mishawn Hemann - 2 wheeled Level of assistance: Modified independence Comments: Significant forward head/ flexed posture/ thoracic kyphosis.  Slow, controlled gait pattern.  Fear of L knee buckling with R LE swing through phase of gait.   TODAY'S TREATMENT:                                                                                                                               DATE: 01/17/2023    Subjective:  Patient reports no pain on arrival. Has not been doing her exercises due to stressors with family.   There.ex.: Ambulated 100' with upright RW and supervision. Very slow cadence and short step length  Supine marching 2 x 10 each LE  Supine SAQ  2 x 10 each LE  Supine ankle pumps 2 x 10 each LE  Supine hip abduction 2 x 10 each LE    Not today:  Nustep L4 10 min. B UE/LE (consistent cadence)- MH to back.   PT discussed family situation/ upcoming wedding weekend.  Seated marching/ LAQ/ ankle pumps (gray chair) 20x each. Sit to stands from gray chair into elevated RW.  Pt. Requires use of UE on armrests today.    Walking in //-bars: forward/ backwards/ lateral walking 2x each.  Cuing to increase cadence/ recip. Pattern.   Seated B shoulder flexion/ posture correction ex./ movement pattern.    PATIENT EDUCATION:  Education details: Daily walking/ posture Person educated: Patient Education method: Psychiatrist  comprehension: verbalized understanding and returned demonstration  HOME EXERCISE PROGRAM: Will issue next tx. session  ASSESSMENT:  CLINICAL IMPRESSION:    Patient ambulatory into clinic from side of building with significant forward flexed posture and heavy forearm support of upright RW. Session focused on supine LE strengthening with good tolerance. Assisted patient back to car via w/c due to lengthy distance back to car and fatigue. Pt. Will benefit from skilled PT services in combination with gym based ex. To improve LE strengthening/ pain-free mobility.   OBJECTIVE IMPAIRMENTS: Abnormal gait, decreased activity tolerance, decreased balance, decreased endurance, decreased mobility, difficulty walking, decreased ROM, decreased strength, hypomobility, increased edema, impaired flexibility, improper body mechanics, postural dysfunction, and pain.   ACTIVITY LIMITATIONS: bending, sitting, standing, squatting, stairs,  transfers, bed mobility, toileting, and locomotion level  PARTICIPATION LIMITATIONS: medication management  PERSONAL FACTORS: Fitness and Past/current experiences are also affecting patient's functional outcome.   REHAB POTENTIAL: Good  CLINICAL DECISION MAKING: Evolving/moderate complexity  EVALUATION COMPLEXITY: Moderate   GOALS: Goals reviewed with patient? Yes  SHORT TERM GOALS: Target date: 01/08/23  Pt. Will be independent with HEP to increase B LE muscle strength 1/2 muscle grade to improve transfer/ safety with walking.  Baseline:  see above Goal status: Not met  2.  Pt. Will complete with no rest breaks to improve walking endurance.   Baseline: TBD.  10/22: not completed at this time secondary to increase mid-back pain.   Goal status: On-going   LONG TERM GOALS: Target date: 02/12/23  Pt. Will increase FOTO to 51 to improve pain-free functional mobility.  Baseline:  initial 40 Goal status: INITIAL  2.  Pt. Able to stand from 21# chair height with no UE assist or low back pain to improve transfers/ independence with daily activity.   Baseline: benefits from armrests/ UE assist.  Goal status: INITIAL  3.  Pt. Able to return to gym based ex. Program with no limitations or c/o low back pain.   Baseline: currently not exercising or going to gym. Goal status: INITIAL   PLAN:  PT FREQUENCY: 2x/week  PT DURATION: 8 weeks  PLANNED INTERVENTIONS: Therapeutic exercises, Therapeutic activity, Neuromuscular re-education, Balance training, Gait training, Patient/Family education, Self Care, Joint mobilization, DME instructions, Dry Needling, Electrical stimulation, Cryotherapy, Moist heat, and Manual therapy.  PLAN FOR NEXT SESSION: Discuss activity/pain at wedding   Maylon Peppers, PT, DPT  01/17/2023, 11:06 AM

## 2023-01-22 ENCOUNTER — Ambulatory Visit (INDEPENDENT_AMBULATORY_CARE_PROVIDER_SITE_OTHER): Payer: Medicare HMO | Admitting: Obstetrics and Gynecology

## 2023-01-22 ENCOUNTER — Ambulatory Visit: Payer: Medicare HMO | Attending: Internal Medicine | Admitting: Physical Therapy

## 2023-01-22 ENCOUNTER — Encounter: Payer: Self-pay | Admitting: Obstetrics and Gynecology

## 2023-01-22 VITALS — BP 148/75 | HR 80

## 2023-01-22 DIAGNOSIS — R269 Unspecified abnormalities of gait and mobility: Secondary | ICD-10-CM | POA: Insufficient documentation

## 2023-01-22 DIAGNOSIS — N393 Stress incontinence (female) (male): Secondary | ICD-10-CM

## 2023-01-22 DIAGNOSIS — N3281 Overactive bladder: Secondary | ICD-10-CM | POA: Diagnosis not present

## 2023-01-22 DIAGNOSIS — M6281 Muscle weakness (generalized): Secondary | ICD-10-CM | POA: Diagnosis not present

## 2023-01-22 DIAGNOSIS — M5459 Other low back pain: Secondary | ICD-10-CM | POA: Insufficient documentation

## 2023-01-22 DIAGNOSIS — R2689 Other abnormalities of gait and mobility: Secondary | ICD-10-CM | POA: Insufficient documentation

## 2023-01-22 DIAGNOSIS — R293 Abnormal posture: Secondary | ICD-10-CM | POA: Insufficient documentation

## 2023-01-22 MED ORDER — SULFAMETHOXAZOLE-TRIMETHOPRIM 800-160 MG PO TABS
1.0000 | ORAL_TABLET | Freq: Two times a day (BID) | ORAL | 0 refills | Status: AC
Start: 2023-01-22 — End: 2023-01-23

## 2023-01-22 NOTE — Progress Notes (Signed)
 Urogynecology Return Visit  SUBJECTIVE  History of Present Illness: Tonja Jezewski is a 77 y.o. female seen in follow-up for mixed incontinence. Plan at last visit was to start trospium 60mg  ER daily. Has previously tried Myrbetriq, gemtesa for OAB.   She has noticed some improvement with the trospium. Not a dramatic difference but has less urgency and less leakage. Able to get to the bathroom a little easier and going through less pads. Had some constipation but improved with miralax. She has cut back on soda and she has noticed a big difference in her symptoms.   Still seeing leakage with bending, lifting.   Daughter was recently diagnosed with pancreatic cancer.   Past Medical History: Patient  has a past medical history of Anxiety, Arthritis, CHF (congestive heart failure) (HCC), COPD (chronic obstructive pulmonary disease) (HCC), Dysplastic nevus (04/22/2019), Dysplastic nevus (04/22/2019), Edema, Hypertension, Hypothyroidism, Leg weakness, bilateral, Osteoporosis, Oxygen deficit, RLS (restless legs syndrome), Shortness of breath, Sleep apnea, and Wheezing.   Past Surgical History: She  has a past surgical history that includes Back surgery; Joint replacement; Foot arthroplasty; Total shoulder arthroplasty (Left, 07/02/2013); Breast biopsy (Left); Cataract extraction w/PHACO (Left, 05/07/2017); Cataract extraction w/PHACO (Right, 04/23/2017); Total shoulder arthroplasty (Right, 04/10/2018); and Cesarean section.   Medications: She has a current medication list which includes the following prescription(s): alprazolam, aspirin ec, azelastine, b-complex with vitamin c, calcium carbonate, chlorpheniramine-hydrocodone, estradiol, ferrous sulfate, breo ellipta, ipratropium-albuterol, ketoconazole, levothyroxine, loratadine-pseudoephedrine, modafinil, multiple vitamins-minerals, omega-3 fatty acids, potassium chloride sa, pramipexole, spironolactone, sulfamethoxazole-trimethoprim,  topiramate, tramadol, trazodone, trospium chloride, and ventolin hfa.   Allergies: Patient is allergic to empagliflozin and lyrica [pregabalin].   Social History: Patient  reports that she quit smoking about 47 years ago. Her smoking use included cigarettes. She started smoking about 57 years ago. She has a 10 pack-year smoking history. She has never used smokeless tobacco. She reports that she does not drink alcohol and does not use drugs.      OBJECTIVE     Physical Exam: Vitals:   01/22/23 1354  BP: (!) 148/75  Pulse: 80   Gen: No apparent distress, A&O x 3.  Detailed Urogynecologic Evaluation:  Deferred.    ASSESSMENT AND PLAN    Ms. Spirito is a 77 y.o. with:  1. SUI (stress urinary incontinence, female)   2. Overactive bladder     SUI - Discussed the option of urethral bulking instead of the incontinence pessary.  - She prefers an office procedure with urethral bulking (Bulkamid). We discussed success rate of approximately 70-80% and possible need for second injection. We reviewed that this is not a permanent procedure and the Bulkamid does dissolve over time. Risks reviewed including injury to bladder or urethra, UTI, urinary retention and hematuria.  - Sent bactrim to pharmacy for day of procedure.  - Will plan to remove pessary at time of procedure.   2. OAB - continue trospium    Return for procedure  Marguerita Beards, MD

## 2023-01-23 ENCOUNTER — Encounter: Payer: Self-pay | Admitting: Physical Therapy

## 2023-01-23 NOTE — Therapy (Signed)
OUTPATIENT PHYSICAL THERAPY THORACOLUMBAR TREATMENT  Patient Name: Hannah Powell MRN: [id number] DOB:May 07, 1945, 77 y.o., female Today's Date: 01/23/2023  END OF SESSION:  PT End of Session - 01/23/23 0836     Visit Number 8    Number of Visits 16    Date for PT Re-Evaluation 02/12/23    PT Start Time 0950    PT Stop Time 1037    PT Time Calculation (min) 47 min    Activity Tolerance Patient tolerated treatment well    Behavior During Therapy [facility] for tasks assessed/performed             Past Medical History:  Diagnosis Date   Anxiety    Arthritis    CHF (congestive heart failure) (HCC)    COPD (chronic obstructive pulmonary disease) (HCC)    Dysplastic nevus 04/22/2019   R abdomen parallel to sup umbilicus - mod   Dysplastic nevus 04/22/2019   R low back above waistline - mild    Edema    feet/legs   Hypertension    dr Darryll Capers      Hypothyroidism    Leg weakness, bilateral    Osteoporosis    Oxygen deficit    2l with bipap at night   RLS (restless legs syndrome)    Shortness of breath    Sleep apnea    bipap   Wheezing    Past Surgical History:  Procedure Laterality Date   BACK SURGERY     cervical   BREAST BIOPSY Left    needle bx-neg   CATARACT EXTRACTION W/PHACO Left 05/07/2017   Procedure: CATARACT EXTRACTION PHACO AND INTRAOCULAR LENS PLACEMENT (IOC);  Surgeon: Galen Manila, MD;  Location: [facility] ORS;  Service: Ophthalmology;  Laterality: Left;  [place] 00:48.7 AP% 11.3 CDE 5.46 Fluid Pack Lot # [id number]   CATARACT EXTRACTION W/PHACO Right 04/23/2017   Procedure: CATARACT EXTRACTION PHACO AND INTRAOCULAR LENS PLACEMENT (IOC);  Surgeon: Galen Manila, MD;  Location: [facility] ORS;  Service: Ophthalmology;  Laterality: Right;  [place] 00:29 AP% 12.8 CDE 3.79 FLUID PACK LOT # [id number]   CESAREAN SECTION     FOOT ARTHROPLASTY     JOINT REPLACEMENT     x2 tkr   TOTAL SHOULDER ARTHROPLASTY Left 07/02/2013   Procedure: LEFT TOTAL SHOULDER  ARTHROPLASTY;  Surgeon: Senaida Lange, MD;  Location: [facility];  Service: Orthopedics;  Laterality: Left;   TOTAL SHOULDER ARTHROPLASTY Right 04/10/2018   Procedure: TOTAL REVERSE SHOULDER ARTHROPLASTY;  Surgeon: Francena Hanly, MD;  Location: [facility];  Service: Orthopedics;  Laterality: Right;    Patient Active Problem List   Diagnosis Date Noted   Bilateral leg pain 08/29/2022   Cervical radiculopathy 08/28/2022   CHF exacerbation (HCC) 08/26/2022   Swelling of lower extremity 08/26/2022   Acute on chronic respiratory failure with hypoxia and hypercapnia (HCC) 03/29/2022   Chronic diastolic CHF (congestive heart failure) (HCC) 03/29/2022   Cellulitis of right lower extremity 03/29/2022   Restless leg syndrome 02/08/2022   Hypothyroidism 02/08/2022   Obesity (BMI 30-39.9) 02/08/2022   Insomnia 02/08/2022   Anxiety 02/08/2022   Poor appetite 02/08/2022   S/P reverse total shoulder arthroplasty, 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   Chronic diastolic heart failure (HCC) 08/09/2016   COPD (chronic obstructive pulmonary disease) (HCC) 08/09/2016   Pulmonary infiltrates    Pneumonia due to Streptococcus pneumoniae (HCC)    Fever    Community acquired pneumonia of right lung    Palliative care by specialist    Goals of care,  counseling/discussion    Pressure injury of skin 07/03/2016   Acute respiratory failure with hypoxia (HCC) 07/02/2016   Acute on chronic diastolic CHF (congestive heart failure) (HCC) 07/21/2015   COPD exacerbation (HCC) 07/21/2015   Acute renal insufficiency 07/21/2015   Hyperglycemia 07/21/2015   HTN (hypertension) 07/21/2015   S/P shoulder replacement 07/02/2013    PCP: Lynnea Ferrier, MD  REFERRING PROVIDER: Lynnea Ferrier, MD  REFERRING DIAG: Lumbar DDD, Neuropathy  Rationale for Evaluation and Treatment: Rehabilitation  THERAPY DIAG:  Other low back pain  Muscle weakness (generalized)  Gait difficulty  Imbalance  Abnormal posture  ONSET DATE:  03/20/2019  SUBJECTIVE:                                                                                                                                                                                           SUBJECTIVE STATEMENT: Pt. Reports chronic h/o lumbar stenosis/ back pain.  Pt. Reports 8/10 low back pain first thing in the morning and uses heating pad/ ibuprofen to decrease back pain to 1/10 after about 2 hours.  Pt. Reports no recent falls and using elevated RW for short distance ambulation and w/c for community and some household mobility.  Pt. States she has poor standing/walking endurance and is very slow with movement, which is why she requires use of w/c.  Increase episodes of COPD since last PT tx. Session.  Pt. Has 2 weddings to attend on 10/26 and 11/16.    PERTINENT HISTORY:  See MD notes.    PAIN:  Are you having pain? Yes: NPRS scale: 8/10 Pain location: low back Pain description: aching Aggravating factors: lying supine/ getting out of bed.  Relieving factors: moist heat  PRECAUTIONS: Fall  RED FLAGS: None   WEIGHT BEARING RESTRICTIONS: No  FALLS:  Has patient fallen in last 6 months? No  LIVING ENVIRONMENT: Lives with: lives with their spouse Lives in: House/apartment Stairs: No Has following equipment at home: Environmental consultant - 2 wheeled, Wheelchair (manual), and Tour manager  OCCUPATION: Retired  PLOF: Requires assistive device for independence  PATIENT GOALS: Increase standing/walking endurance.  Decrease low back pain.  NEXT MD VISIT: PRN  OBJECTIVE:  Note: Objective measures were completed at Evaluation unless otherwise noted.  DIAGNOSTIC FINDINGS:  COMPARISON:  Lumbar spine MRI from 06/26/2020   FINDINGS: Normal alignment. The vertebral body heights are well preserved. No signs of acute fracture or subluxation. Moderate multilevel disc space narrowing and anterior and posterior spur formation. Facet arthropathy identified within the lower lumbar  spine. Aortic atherosclerosis. Moderate stool burden noted within the colon.   IMPRESSION: 1. No acute  findings. 2. Moderate multilevel degenerative disc disease and facet arthropathy.  PATIENT SURVEYS:  FOTO initial 40/ goal 41  SCREENING FOR RED FLAGS: Bowel or bladder incontinence: yes Spinal tumors: No Cauda equina syndrome: No Compression fracture: No Abdominal aneurysm: No  COGNITION: Overall cognitive status: Within functional limits for tasks assessed   SENSATION: WFL, moderate lower leg swelling noted.  MUSCLE LENGTH: No supine positioning today Hamstrings: NT Thomas test: NT   POSTURE: rounded shoulders, forward head, increased thoracic kyphosis, and flexed trunk   PALPATION: Tenderness over lumbar paraspinals (generalized).  LUMBAR ROM:   AROM eval  Flexion 50% limited  Extension 100% limited  Right lateral flexion 50% limited  Left lateral flexion 50% limited  Right rotation 50% limited  Left rotation 50% limited   (Blank rows = not tested)  LOWER EXTREMITY ROM:       B hip flexion/ knee extension limited.    LOWER EXTREMITY MMT:       B LE muscle strength grossly 4/5 MMT except hamstring 4+/5 MMT and hip flexion 4-/5 MMT  Ascends steps with L LE and heavy UE assist.   LUMBAR SPECIAL TESTS:  Next tx. Session.  No supine positioning today.  FUNCTIONAL TESTS:  5 times sit to stand: 22.13 sec. With use of armrests.  Pt. Limited with STS from gray chair without UE assist.   6 minute walk test: TBD  GAIT: Distance walked: in clinic with elevated RW Assistive device utilized: Walker - 2 wheeled Level of assistance: Modified independence Comments: Significant forward head/ flexed posture/ thoracic kyphosis.  Slow, controlled gait pattern.  Fear of L knee buckling with R LE swing through phase of gait.   TODAY'S TREATMENT:                                                                                                                               DATE: 01/23/2023    Subjective:  Patient reports mid-back discomfort on arrival.  PT discussed contacting MD about x-ray results of thoracic compression fracture.  Pt. Reports major stressors with family which has been really difficult.     There.ex.: Ambulated 68" with upright RW and supervision. Very slow cadence and short step length.  PT cued pt. With more recip. Gait pattern/ consistent step pattern with fewer breaks, esp. During turning.    Walking in //-bars: forward/ backwards/ lateral walking 2x each.  Cuing to increase cadence/ recip. Pattern.    Seated B shoulder flexion with posture correction ex./ movement pattern.  Good back support in chair (discussed compression fx. In mid-back).    Nustep L4 10 min. B UE/LE (consistent cadence)- MH to back.   Ambulate 55 feet from gym to outside of clinic with elevated RW and cuing for consistent recip. Gait pattern.    No supine ex. today  PATIENT EDUCATION:  Education details: Daily walking/ posture Person educated: Patient Education method: Explanation and Demonstration Education comprehension: verbalized understanding and returned  demonstration  HOME EXERCISE PROGRAM: Will issue next tx. session  ASSESSMENT:  CLINICAL IMPRESSION:    Patient ambulated into clinic/gym from front of building with significant forward flexed posture and heavy forearm support of upright RW.  Extra time required and PT provided SBA/ cuing to increase recip. Step pattern/ cadence to improve efficiency with gait.  No episodes of knee buckling but increase in mid-back pain reported.  Pt. Stressed out with family health situation and PT encouraged pt. To focus on herself mentally/physically to better handle the situation.   Pt. Will benefit from skilled PT services in combination with gym based ex. To improve LE strengthening/ pain-free mobility.   OBJECTIVE IMPAIRMENTS: Abnormal gait, decreased activity tolerance, decreased balance, decreased endurance,  decreased mobility, difficulty walking, decreased ROM, decreased strength, hypomobility, increased edema, impaired flexibility, improper body mechanics, postural dysfunction, and pain.   ACTIVITY LIMITATIONS: bending, sitting, standing, squatting, stairs, transfers, bed mobility, toileting, and locomotion level  PARTICIPATION LIMITATIONS: medication management  PERSONAL FACTORS: Fitness and Past/current experiences are also affecting patient's functional outcome.   REHAB POTENTIAL: Good  CLINICAL DECISION MAKING: Evolving/moderate complexity  EVALUATION COMPLEXITY: Moderate   GOALS: Goals reviewed with patient? Yes  SHORT TERM GOALS: Target date: 01/08/23  Pt. Will be independent with HEP to increase B LE muscle strength 1/2 muscle grade to improve transfer/ safety with walking.  Baseline:  see above Goal status: Not met  2.  Pt. Will complete with no rest breaks to improve walking endurance.   Baseline: TBD.  10/22: not completed at this time secondary to increase mid-back pain.   Goal status: On-going   LONG TERM GOALS: Target date: 02/12/23  Pt. Will increase FOTO to 51 to improve pain-free functional mobility.  Baseline:  initial 40 Goal status: INITIAL  2.  Pt. Able to stand from 21# chair height with no UE assist or low back pain to improve transfers/ independence with daily activity.   Baseline: benefits from armrests/ UE assist.  Goal status: INITIAL  3.  Pt. Able to return to gym based ex. Program with no limitations or c/o low back pain.   Baseline: currently not exercising or going to gym. Goal status: INITIAL   PLAN:  PT FREQUENCY: 2x/week  PT DURATION: 8 weeks  PLANNED INTERVENTIONS: Therapeutic exercises, Therapeutic activity, Neuromuscular re-education, Balance training, Gait training, Patient/Family education, Self Care, Joint mobilization, DME instructions, Dry Needling, Electrical stimulation, Cryotherapy, Moist heat, and Manual  therapy.  PLAN FOR NEXT SESSION: Check goals.     Cammie Mcgee, PT, DPT # [phone] 01/23/2023, 8:37 AM

## 2023-01-24 ENCOUNTER — Ambulatory Visit: Payer: Medicare HMO | Admitting: Physical Therapy

## 2023-01-24 DIAGNOSIS — R293 Abnormal posture: Secondary | ICD-10-CM | POA: Diagnosis not present

## 2023-01-24 DIAGNOSIS — M6281 Muscle weakness (generalized): Secondary | ICD-10-CM

## 2023-01-24 DIAGNOSIS — M5459 Other low back pain: Secondary | ICD-10-CM

## 2023-01-24 DIAGNOSIS — R269 Unspecified abnormalities of gait and mobility: Secondary | ICD-10-CM | POA: Diagnosis not present

## 2023-01-24 DIAGNOSIS — R2689 Other abnormalities of gait and mobility: Secondary | ICD-10-CM

## 2023-01-28 NOTE — Therapy (Signed)
OUTPATIENT PHYSICAL THERAPY THORACOLUMBAR TREATMENT Physical Therapy Progress Note   Dates of reporting period  12/18/22   to   01/24/23   Patient Name: Hannah Powell MRN: [id number] DOB:07/18/1945, 77 y.o., female Today's Date: 01/24/23  END OF SESSION:  PT End of Session - 01/28/23 0814     Visit Number 9    Number of Visits 16    Date for PT Re-Evaluation 02/12/23    PT Start Time 0946    PT Stop Time 1035    PT Time Calculation (min) 49 min    Activity Tolerance Patient tolerated treatment well    Behavior During Therapy [facility] for tasks assessed/performed             Past Medical History:  Diagnosis Date   Anxiety    Arthritis    CHF (congestive heart failure) (HCC)    COPD (chronic obstructive pulmonary disease) (HCC)    Dysplastic nevus 04/22/2019   R abdomen parallel to sup umbilicus - mod   Dysplastic nevus 04/22/2019   R low back above waistline - mild    Edema    feet/legs   Hypertension    dr Darryll Capers      Hypothyroidism    Leg weakness, bilateral    Osteoporosis    Oxygen deficit    2l with bipap at night   RLS (restless legs syndrome)    Shortness of breath    Sleep apnea    bipap   Wheezing    Past Surgical History:  Procedure Laterality Date   BACK SURGERY     cervical   BREAST BIOPSY Left    needle bx-neg   CATARACT EXTRACTION W/PHACO Left 05/07/2017   Procedure: CATARACT EXTRACTION PHACO AND INTRAOCULAR LENS PLACEMENT (IOC);  Surgeon: Galen Manila, MD;  Location: [facility] ORS;  Service: Ophthalmology;  Laterality: Left;  [place] 00:48.7 AP% 11.3 CDE 5.46 Fluid Pack Lot # [id number] H   CATARACT EXTRACTION W/PHACO Right 04/23/2017   Procedure: CATARACT EXTRACTION PHACO AND INTRAOCULAR LENS PLACEMENT (IOC);  Surgeon: Galen Manila, MD;  Location: [facility] ORS;  Service: Ophthalmology;  Laterality: Right;  [place] 00:29 AP% 12.8 CDE 3.79 FLUID PACK LOT # [id number]   CESAREAN SECTION     FOOT ARTHROPLASTY     JOINT REPLACEMENT     x2 tkr    TOTAL SHOULDER ARTHROPLASTY Left 07/02/2013   Procedure: LEFT TOTAL SHOULDER ARTHROPLASTY;  Surgeon: Senaida Lange, MD;  Location: [facility];  Service: Orthopedics;  Laterality: Left;   TOTAL SHOULDER ARTHROPLASTY Right 04/10/2018   Procedure: TOTAL REVERSE SHOULDER ARTHROPLASTY;  Surgeon: Francena Hanly, MD;  Location: [facility];  Service: Orthopedics;  Laterality: Right;    Patient Active Problem List   Diagnosis Date Noted   Bilateral leg pain 08/29/2022   Cervical radiculopathy 08/28/2022   CHF exacerbation (HCC) 08/26/2022   Swelling of lower extremity 08/26/2022   Acute on chronic respiratory failure with hypoxia and hypercapnia (HCC) 03/29/2022   Chronic diastolic CHF (congestive heart failure) (HCC) 03/29/2022   Cellulitis of right lower extremity 03/29/2022   Restless leg syndrome 02/08/2022   Hypothyroidism 02/08/2022   Obesity (BMI 30-39.9) 02/08/2022   Insomnia 02/08/2022   Anxiety 02/08/2022   Poor appetite 02/08/2022   S/P reverse total shoulder arthroplasty, 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   Chronic diastolic heart failure (HCC) 08/09/2016   COPD (chronic obstructive pulmonary disease) (HCC) 08/09/2016   Pulmonary infiltrates    Pneumonia due to Streptococcus pneumoniae (HCC)    Fever  Community acquired pneumonia of right lung    Palliative care by specialist    Goals of care, counseling/discussion    Pressure injury of skin 07/03/2016   Acute respiratory failure with hypoxia (HCC) 07/02/2016   Acute on chronic diastolic CHF (congestive heart failure) (HCC) 07/21/2015   COPD exacerbation (HCC) 07/21/2015   Acute renal insufficiency 07/21/2015   Hyperglycemia 07/21/2015   HTN (hypertension) 07/21/2015   S/P shoulder replacement 07/02/2013    PCP: Lynnea Ferrier, MD  REFERRING PROVIDER: Lynnea Ferrier, MD  REFERRING DIAG: Lumbar DDD, Neuropathy  Rationale for Evaluation and Treatment: Rehabilitation  THERAPY DIAG:  Other low back pain  Muscle weakness  (generalized)  Gait difficulty  Imbalance  Abnormal posture  ONSET DATE: 03/20/2019  SUBJECTIVE:                                                                                                                                                                                           SUBJECTIVE STATEMENT: Pt. Reports chronic h/o lumbar stenosis/ back pain.  Pt. Reports 8/10 low back pain first thing in the morning and uses heating pad/ ibuprofen to decrease back pain to 1/10 after about 2 hours.  Pt. Reports no recent falls and using elevated RW for short distance ambulation and w/c for community and some household mobility.  Pt. States she has poor standing/walking endurance and is very slow with movement, which is why she requires use of w/c.  Increase episodes of COPD since last PT tx. Session.  Pt. Has 2 weddings to attend on 10/26 and 11/16.    PERTINENT HISTORY:  See MD notes.    PAIN:  Are you having pain? Yes: NPRS scale: 8/10 Pain location: low back Pain description: aching Aggravating factors: lying supine/ getting out of bed.  Relieving factors: moist heat  PRECAUTIONS: Fall  RED FLAGS: None   WEIGHT BEARING RESTRICTIONS: No  FALLS:  Has patient fallen in last 6 months? No  LIVING ENVIRONMENT: Lives with: lives with their spouse Lives in: House/apartment Stairs: No Has following equipment at home: Environmental consultant - 2 wheeled, Wheelchair (manual), and Tour manager  OCCUPATION: Retired  PLOF: Requires assistive device for independence  PATIENT GOALS: Increase standing/walking endurance.  Decrease low back pain.  NEXT MD VISIT: PRN  OBJECTIVE:  Note: Objective measures were completed at Evaluation unless otherwise noted.  DIAGNOSTIC FINDINGS:  COMPARISON:  Lumbar spine MRI from 06/26/2020   FINDINGS: Normal alignment. The vertebral body heights are well preserved. No signs of acute fracture or subluxation. Moderate multilevel disc space narrowing and anterior and  posterior spur formation. Facet arthropathy identified within  the lower lumbar spine. Aortic atherosclerosis. Moderate stool burden noted within the colon.   IMPRESSION: 1. No acute findings. 2. Moderate multilevel degenerative disc disease and facet arthropathy.  PATIENT SURVEYS:  FOTO initial 40/ goal 8  SCREENING FOR RED FLAGS: Bowel or bladder incontinence: yes Spinal tumors: No Cauda equina syndrome: No Compression fracture: No Abdominal aneurysm: No  COGNITION: Overall cognitive status: Within functional limits for tasks assessed   SENSATION: WFL, moderate lower leg swelling noted.  MUSCLE LENGTH: No supine positioning today Hamstrings: NT Thomas test: NT   POSTURE: rounded shoulders, forward head, increased thoracic kyphosis, and flexed trunk   PALPATION: Tenderness over lumbar paraspinals (generalized).  LUMBAR ROM:   AROM eval  Flexion 50% limited  Extension 100% limited  Right lateral flexion 50% limited  Left lateral flexion 50% limited  Right rotation 50% limited  Left rotation 50% limited   (Blank rows = not tested)  LOWER EXTREMITY ROM:       B hip flexion/ knee extension limited.    LOWER EXTREMITY MMT:       B LE muscle strength grossly 4/5 MMT except hamstring 4+/5 MMT and hip flexion 4-/5 MMT  Ascends steps with L LE and heavy UE assist.   LUMBAR SPECIAL TESTS:  Next tx. Session.  No supine positioning today.  FUNCTIONAL TESTS:  5 times sit to stand: 22.13 sec. With use of armrests.  Pt. Limited with STS from gray chair without UE assist.   6 minute walk test: TBD  GAIT: Distance walked: in clinic with elevated RW Assistive device utilized: Walker - 2 wheeled Level of assistance: Modified independence Comments: Significant forward head/ flexed posture/ thoracic kyphosis.  Slow, controlled gait pattern.  Fear of L knee buckling with R LE swing through phase of gait.   TODAY'S TREATMENT:                                                                                                                               DATE: 01/24/23    Subjective:  Pt. Continues to be limited by major stressors with family which has been really difficult mentally/physically.  Pt. Has not been to gym this week.      There.ex.: Ambulated 60" with upright RW and supervision. Very slow cadence and short step length.  PT cued pt. With more recip. Gait pattern/ consistent step pattern with fewer breaks, esp. During turning.    Walking in //-bars: forward/ backwards/ lateral walking 3x each.  Cuing to increase cadence/ recip. Pattern.  Pt. Fearful with R knee buckling, esp. With turning at end of //-bars.    Seated B shoulder flexion with posture correction ex./ movement pattern.  Good back support in chair.  Seated Nautilus: 50# lat. Pull downs 20x2  Nustep L4 10 min. B UE/LE (consistent cadence)- MH to back.   Ambulate 55 feet from gym to outside of clinic with elevated RW and cuing for consistent recip. Gait pattern.  No supine ex. today  PATIENT EDUCATION:  Education details: Daily walking/ posture Person educated: Patient Education method: Medical illustrator Education comprehension: verbalized understanding and returned demonstration  HOME EXERCISE PROGRAM: Will issue next tx. session  ASSESSMENT:  CLINICAL IMPRESSION:    Patient ambulated into clinic/gym from front of building with significant forward flexed posture and heavy forearm support of upright RW.  Extra time required and PT provided SBA/ cuing to increase recip. Step pattern/ cadence to improve efficiency with gait.  No episodes of knee buckling but pt. Fearful of R knee buckling while turning.  Pt. Stressed out with family health situation and PT encouraged pt. To focus on herself mentally/physically to better handle the situation.   Pt. Will benefit from skilled PT services in combination with gym based ex. To improve LE strengthening/ pain-free mobility.    OBJECTIVE IMPAIRMENTS: Abnormal gait, decreased activity tolerance, decreased balance, decreased endurance, decreased mobility, difficulty walking, decreased ROM, decreased strength, hypomobility, increased edema, impaired flexibility, improper body mechanics, postural dysfunction, and pain.   ACTIVITY LIMITATIONS: bending, sitting, standing, squatting, stairs, transfers, bed mobility, toileting, and locomotion level  PARTICIPATION LIMITATIONS: medication management  PERSONAL FACTORS: Fitness and Past/current experiences are also affecting patient's functional outcome.   REHAB POTENTIAL: Good  CLINICAL DECISION MAKING: Evolving/moderate complexity  EVALUATION COMPLEXITY: Moderate   GOALS: Goals reviewed with patient? Yes  SHORT TERM GOALS: Target date: 01/08/23  Pt. Will be independent with HEP to increase B LE muscle strength 1/2 muscle grade to improve transfer/ safety with walking.  Baseline:  see above Goal status: Not met  2.  Pt. Will complete with no rest breaks to improve walking endurance.   Baseline: TBD.  10/22: not completed at this time secondary to increase mid-back pain.   Goal status: On-going   LONG TERM GOALS: Target date: 02/12/23  Pt. Will increase FOTO to 51 to improve pain-free functional mobility.  Baseline:  initial 40 Goal status: On-going  2.  Pt. Able to stand from 21# chair height with no UE assist or low back pain to improve transfers/ independence with daily activity.   Baseline: benefits from armrests/ UE assist.  Goal status: Not met  3.  Pt. Able to return to gym based ex. Program with no limitations or c/o low back pain.   Baseline: currently not exercising or going to gym. Goal status: Not met   PLAN:  PT FREQUENCY: 2x/week  PT DURATION: 8 weeks  PLANNED INTERVENTIONS: Therapeutic exercises, Therapeutic activity, Neuromuscular re-education, Balance training, Gait training, Patient/Family education, Self Care, Joint  mobilization, DME instructions, Dry Needling, Electrical stimulation, Cryotherapy, Moist heat, and Manual therapy.  PLAN FOR NEXT SESSION: Check goals and schedule.   Cammie Mcgee, PT, DPT # [id number] 01/28/2023, 8:15 AM

## 2023-01-29 ENCOUNTER — Ambulatory Visit: Payer: Medicare HMO | Admitting: Physical Therapy

## 2023-01-29 ENCOUNTER — Encounter: Payer: Self-pay | Admitting: Physical Therapy

## 2023-01-29 DIAGNOSIS — R293 Abnormal posture: Secondary | ICD-10-CM | POA: Diagnosis not present

## 2023-01-29 DIAGNOSIS — R2689 Other abnormalities of gait and mobility: Secondary | ICD-10-CM | POA: Diagnosis not present

## 2023-01-29 DIAGNOSIS — M6281 Muscle weakness (generalized): Secondary | ICD-10-CM

## 2023-01-29 DIAGNOSIS — I5032 Chronic diastolic (congestive) heart failure: Secondary | ICD-10-CM | POA: Diagnosis not present

## 2023-01-29 DIAGNOSIS — M5459 Other low back pain: Secondary | ICD-10-CM

## 2023-01-29 DIAGNOSIS — R7303 Prediabetes: Secondary | ICD-10-CM | POA: Diagnosis not present

## 2023-01-29 DIAGNOSIS — R269 Unspecified abnormalities of gait and mobility: Secondary | ICD-10-CM

## 2023-01-29 NOTE — Therapy (Signed)
OUTPATIENT PHYSICAL THERAPY THORACOLUMBAR TREATMENT  Patient Name: Hannah Powell MRN: [id number] DOB:Apr 17, 1945, 77 y.o., female Today's Date: 01/30/2023  END OF SESSION:  PT End of Session - 01/29/23 0946     Visit Number 10    Number of Visits 16    Date for PT Re-Evaluation 02/12/23    PT Start Time 0946    PT Stop Time 1033    PT Time Calculation (min) 47 min    Activity Tolerance Patient tolerated treatment well    Behavior During Therapy [facility] for tasks assessed/performed             Past Medical History:  Diagnosis Date   Anxiety    Arthritis    CHF (congestive heart failure) (HCC)    COPD (chronic obstructive pulmonary disease) (HCC)    Dysplastic nevus 04/22/2019   R abdomen parallel to sup umbilicus - mod   Dysplastic nevus 04/22/2019   R low back above waistline - mild    Edema    feet/legs   Hypertension    dr Darryll Capers      Hypothyroidism    Leg weakness, bilateral    Osteoporosis    Oxygen deficit    2l with bipap at night   RLS (restless legs syndrome)    Shortness of breath    Sleep apnea    bipap   Wheezing    Past Surgical History:  Procedure Laterality Date   BACK SURGERY     cervical   BREAST BIOPSY Left    needle bx-neg   CATARACT EXTRACTION W/PHACO Left 05/07/2017   Procedure: CATARACT EXTRACTION PHACO AND INTRAOCULAR LENS PLACEMENT (IOC);  Surgeon: Galen Manila, MD;  Location: [facility] ORS;  Service: Ophthalmology;  Laterality: Left;  [place] 00:48.7 AP% 11.3 CDE 5.46 Fluid Pack Lot # [id number]   CATARACT EXTRACTION W/PHACO Right 04/23/2017   Procedure: CATARACT EXTRACTION PHACO AND INTRAOCULAR LENS PLACEMENT (IOC);  Surgeon: Galen Manila, MD;  Location: [facility] ORS;  Service: Ophthalmology;  Laterality: Right;  [place] 00:29 AP% 12.8 CDE 3.79 FLUID PACK LOT # [id number]   CESAREAN SECTION     FOOT ARTHROPLASTY     JOINT REPLACEMENT     x2 tkr   TOTAL SHOULDER ARTHROPLASTY Left 07/02/2013   Procedure: LEFT TOTAL SHOULDER  ARTHROPLASTY;  Surgeon: Senaida Lange, MD;  Location: [facility];  Service: Orthopedics;  Laterality: Left;   TOTAL SHOULDER ARTHROPLASTY Right 04/10/2018   Procedure: TOTAL REVERSE SHOULDER ARTHROPLASTY;  Surgeon: Francena Hanly, MD;  Location: [facility];  Service: Orthopedics;  Laterality: Right;    Patient Active Problem List   Diagnosis Date Noted   Bilateral leg pain 08/29/2022   Cervical radiculopathy 08/28/2022   CHF exacerbation (HCC) 08/26/2022   Swelling of lower extremity 08/26/2022   Acute on chronic respiratory failure with hypoxia and hypercapnia (HCC) 03/29/2022   Chronic diastolic CHF (congestive heart failure) (HCC) 03/29/2022   Cellulitis of right lower extremity 03/29/2022   Restless leg syndrome 02/08/2022   Hypothyroidism 02/08/2022   Obesity (BMI 30-39.9) 02/08/2022   Insomnia 02/08/2022   Anxiety 02/08/2022   Poor appetite 02/08/2022   S/P reverse total shoulder arthroplasty, 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   Chronic diastolic heart failure (HCC) 08/09/2016   COPD (chronic obstructive pulmonary disease) (HCC) 08/09/2016   Pulmonary infiltrates    Pneumonia due to Streptococcus pneumoniae (HCC)    Fever    Community acquired pneumonia of right lung    Palliative care by specialist    Goals of care,  counseling/discussion    Pressure injury of skin 07/03/2016   Acute respiratory failure with hypoxia (HCC) 07/02/2016   Acute on chronic diastolic CHF (congestive heart failure) (HCC) 07/21/2015   COPD exacerbation (HCC) 07/21/2015   Acute renal insufficiency 07/21/2015   Hyperglycemia 07/21/2015   HTN (hypertension) 07/21/2015   S/P shoulder replacement 07/02/2013    PCP: Lynnea Ferrier, MD  REFERRING PROVIDER: Lynnea Ferrier, MD  REFERRING DIAG: Lumbar DDD, Neuropathy  Rationale for Evaluation and Treatment: Rehabilitation  THERAPY DIAG:  Other low back pain  Muscle weakness (generalized)  Gait difficulty  Imbalance  Abnormal posture  ONSET DATE:  03/20/2019  SUBJECTIVE:                                                                                                                                                                                           SUBJECTIVE STATEMENT: Pt. Reports chronic h/o lumbar stenosis/ back pain.  Pt. Reports 8/10 low back pain first thing in the morning and uses heating pad/ ibuprofen to decrease back pain to 1/10 after about 2 hours.  Pt. Reports no recent falls and using elevated RW for short distance ambulation and w/c for community and some household mobility.  Pt. States she has poor standing/walking endurance and is very slow with movement, which is why she requires use of w/c.  Increase episodes of COPD since last PT tx. Session.  Pt. Has 2 weddings to attend on 10/26 and 11/16.    PERTINENT HISTORY:  See MD notes.    PAIN:  Are you having pain? Yes: NPRS scale: 8/10 Pain location: low back Pain description: aching Aggravating factors: lying supine/ getting out of bed.  Relieving factors: moist heat  PRECAUTIONS: Fall  RED FLAGS: None   WEIGHT BEARING RESTRICTIONS: No  FALLS:  Has patient fallen in last 6 months? No  LIVING ENVIRONMENT: Lives with: lives with their spouse Lives in: House/apartment Stairs: No Has following equipment at home: Environmental consultant - 2 wheeled, Wheelchair (manual), and Tour manager  OCCUPATION: Retired  PLOF: Requires assistive device for independence  PATIENT GOALS: Increase standing/walking endurance.  Decrease low back pain.  NEXT MD VISIT: PRN  OBJECTIVE:  Note: Objective measures were completed at Evaluation unless otherwise noted.  DIAGNOSTIC FINDINGS:  COMPARISON:  Lumbar spine MRI from 06/26/2020   FINDINGS: Normal alignment. The vertebral body heights are well preserved. No signs of acute fracture or subluxation. Moderate multilevel disc space narrowing and anterior and posterior spur formation. Facet arthropathy identified within the lower lumbar  spine. Aortic atherosclerosis. Moderate stool burden noted within the colon.   IMPRESSION: 1. No acute  findings. 2. Moderate multilevel degenerative disc disease and facet arthropathy.  PATIENT SURVEYS:  FOTO initial 40/ goal 73  SCREENING FOR RED FLAGS: Bowel or bladder incontinence: yes Spinal tumors: No Cauda equina syndrome: No Compression fracture: No Abdominal aneurysm: No  COGNITION: Overall cognitive status: Within functional limits for tasks assessed   SENSATION: WFL, moderate lower leg swelling noted.  MUSCLE LENGTH: No supine positioning today Hamstrings: NT Thomas test: NT   POSTURE: rounded shoulders, forward head, increased thoracic kyphosis, and flexed trunk   PALPATION: Tenderness over lumbar paraspinals (generalized).  LUMBAR ROM:   AROM eval  Flexion 50% limited  Extension 100% limited  Right lateral flexion 50% limited  Left lateral flexion 50% limited  Right rotation 50% limited  Left rotation 50% limited   (Blank rows = not tested)  LOWER EXTREMITY ROM:       B hip flexion/ knee extension limited.    LOWER EXTREMITY MMT:       B LE muscle strength grossly 4/5 MMT except hamstring 4+/5 MMT and hip flexion 4-/5 MMT  Ascends steps with L LE and heavy UE assist.   LUMBAR SPECIAL TESTS:  Next tx. Session.  No supine positioning today.  FUNCTIONAL TESTS:  5 times sit to stand: 22.13 sec. With use of armrests.  Pt. Limited with STS from gray chair without UE assist.   6 minute walk test: TBD  GAIT: Distance walked: in clinic with elevated RW Assistive device utilized: Walker - 2 wheeled Level of assistance: Modified independence Comments: Significant forward head/ flexed posture/ thoracic kyphosis.  Slow, controlled gait pattern.  Fear of L knee buckling with R LE swing through phase of gait.   TODAY'S TREATMENT:                                                                                                                               DATE: 01/30/2023    Subjective:  Pt. Continues to be limited by major stressors with family which has been really difficult mentally/physically.  Pt. Has not been to gym this week.      There.ex.: Ambulated 64" with upright RW and supervision. Very slow cadence and short step length.  PT cued pt. With more recip. Gait pattern/ consistent step pattern with fewer breaks, esp. During turning.    Seated B shoulder wand press-ups/ chest press 15x each.    TRX 8x (marked fatigue)- difficulty with knee flexion to chair (modified).    Seated Nautilus: 50# lat. Pull downs 20x2  Sit to stands from elevated blue mat table 10x (no UE assist).    Nustep L4 10 min. B UE/LE (consistent cadence)- MH to back.   Ambulate 66 feet from gym to outside of clinic with elevated RW and cuing for consistent recip. Gait pattern.    No supine ex. today  PATIENT EDUCATION:  Education details: Daily walking/ posture Person educated: Patient Education method: Explanation and Demonstration Education comprehension: verbalized understanding  and returned demonstration  HOME EXERCISE PROGRAM: Will issue next tx. session  ASSESSMENT:  CLINICAL IMPRESSION:    Patient ambulated into clinic/gym from front of building with significant forward flexed posture and heavy forearm support of upright RW.  Extra time required and PT provided SBA/ cuing to increase recip. Step pattern/ cadence to improve efficiency with gait.  No episodes of knee buckling but pt. Fearful of R knee buckling while turning.  Pt. Stressed out with family health situation and PT encouraged pt. To focus on herself mentally/physically to better handle the situation.   Pt. Will benefit from skilled PT services in combination with gym based ex. To improve LE strengthening/ pain-free mobility.   OBJECTIVE IMPAIRMENTS: Abnormal gait, decreased activity tolerance, decreased balance, decreased endurance, decreased mobility, difficulty walking, decreased ROM,  decreased strength, hypomobility, increased edema, impaired flexibility, improper body mechanics, postural dysfunction, and pain.   ACTIVITY LIMITATIONS: bending, sitting, standing, squatting, stairs, transfers, bed mobility, toileting, and locomotion level  PARTICIPATION LIMITATIONS: medication management  PERSONAL FACTORS: Fitness and Past/current experiences are also affecting patient's functional outcome.   REHAB POTENTIAL: Good  CLINICAL DECISION MAKING: Evolving/moderate complexity  EVALUATION COMPLEXITY: Moderate   GOALS: Goals reviewed with patient? Yes  SHORT TERM GOALS: Target date: 01/08/23  Pt. Will be independent with HEP to increase B LE muscle strength 1/2 muscle grade to improve transfer/ safety with walking.  Baseline:  see above Goal status: Not met  2.  Pt. Will complete with no rest breaks to improve walking endurance.   Baseline: TBD.  10/22: not completed at this time secondary to increase mid-back pain.   Goal status: On-going   LONG TERM GOALS: Target date: 02/12/23  Pt. Will increase FOTO to 51 to improve pain-free functional mobility.  Baseline:  initial 40 Goal status: INITIAL  2.  Pt. Able to stand from 21# chair height with no UE assist or low back pain to improve transfers/ independence with daily activity.   Baseline: benefits from armrests/ UE assist.  Goal status: INITIAL  3.  Pt. Able to return to gym based ex. Program with no limitations or c/o low back pain.   Baseline: currently not exercising or going to gym. Goal status: INITIAL   PLAN:  PT FREQUENCY: 2x/week  PT DURATION: 8 weeks  PLANNED INTERVENTIONS: Therapeutic exercises, Therapeutic activity, Neuromuscular re-education, Balance training, Gait training, Patient/Family education, Self Care, Joint mobilization, DME instructions, Dry Needling, Electrical stimulation, Cryotherapy, Moist heat, and Manual therapy.  PLAN FOR NEXT SESSION: Issue new schedule.    Cammie Mcgee, PT, DPT # [phone] 01/30/2023, 1:24 PM

## 2023-01-31 ENCOUNTER — Encounter: Payer: Self-pay | Admitting: Physical Therapy

## 2023-01-31 ENCOUNTER — Ambulatory Visit: Payer: Medicare HMO | Admitting: Physical Therapy

## 2023-01-31 DIAGNOSIS — M6281 Muscle weakness (generalized): Secondary | ICD-10-CM | POA: Diagnosis not present

## 2023-01-31 DIAGNOSIS — R293 Abnormal posture: Secondary | ICD-10-CM | POA: Diagnosis not present

## 2023-01-31 DIAGNOSIS — R2689 Other abnormalities of gait and mobility: Secondary | ICD-10-CM | POA: Diagnosis not present

## 2023-01-31 DIAGNOSIS — R269 Unspecified abnormalities of gait and mobility: Secondary | ICD-10-CM | POA: Diagnosis not present

## 2023-01-31 DIAGNOSIS — M5459 Other low back pain: Secondary | ICD-10-CM | POA: Diagnosis not present

## 2023-01-31 NOTE — Therapy (Signed)
OUTPATIENT PHYSICAL THERAPY THORACOLUMBAR TREATMENT  Patient Name: Hannah Powell MRN: [id number] DOB:[birth year], 77 y.o., female Today's Date: 01/31/2023  END OF SESSION:  PT End of Session - 01/31/23 0857     Visit Number 11    Number of Visits 16    Date for PT Re-Evaluation 02/12/23    PT Start Time 0852    PT Stop Time 0954    PT Time Calculation (min) 62 min    Activity Tolerance Patient tolerated treatment well    Behavior During Therapy [facility] for tasks assessed/performed             Past Medical History:  Diagnosis Date   Anxiety    Arthritis    CHF (congestive heart failure) (HCC)    COPD (chronic obstructive pulmonary disease) (HCC)    Dysplastic nevus 04/22/2019   R abdomen parallel to sup umbilicus - mod   Dysplastic nevus 04/22/2019   R low back above waistline - mild    Edema    feet/legs   Hypertension    dr Darryll Capers      Hypothyroidism    Leg weakness, bilateral    Osteoporosis    Oxygen deficit    2l with bipap at night   RLS (restless legs syndrome)    Shortness of breath    Sleep apnea    bipap   Wheezing    Past Surgical History:  Procedure Laterality Date   BACK SURGERY     cervical   BREAST BIOPSY Left    needle bx-neg   CATARACT EXTRACTION W/PHACO Left 05/07/2017   Procedure: CATARACT EXTRACTION PHACO AND INTRAOCULAR LENS PLACEMENT (IOC);  Surgeon: Galen Manila, MD;  Location: [facility] ORS;  Service: Ophthalmology;  Laterality: Left;  [place] 00:48.7 AP% 11.3 CDE 5.46 Fluid Pack Lot # [id number] H   CATARACT EXTRACTION W/PHACO Right 04/23/2017   Procedure: CATARACT EXTRACTION PHACO AND INTRAOCULAR LENS PLACEMENT (IOC);  Surgeon: Galen Manila, MD;  Location: [facility] ORS;  Service: Ophthalmology;  Laterality: Right;  [place] 00:29 AP% 12.8 CDE 3.79 FLUID PACK LOT # [id number]   CESAREAN SECTION     FOOT ARTHROPLASTY     JOINT REPLACEMENT     x2 tkr   TOTAL SHOULDER ARTHROPLASTY Left 07/02/2013   Procedure: LEFT TOTAL SHOULDER  ARTHROPLASTY;  Surgeon: Senaida Lange, MD;  Location: [facility];  Service: Orthopedics;  Laterality: Left;   TOTAL SHOULDER ARTHROPLASTY Right 04/10/2018   Procedure: TOTAL REVERSE SHOULDER ARTHROPLASTY;  Surgeon: Francena Hanly, MD;  Location: [facility];  Service: Orthopedics;  Laterality: Right;    Patient Active Problem List   Diagnosis Date Noted   Bilateral leg pain 08/29/2022   Cervical radiculopathy 08/28/2022   CHF exacerbation (HCC) 08/26/2022   Swelling of lower extremity 08/26/2022   Acute on chronic respiratory failure with hypoxia and hypercapnia (HCC) 03/29/2022   Chronic diastolic CHF (congestive heart failure) (HCC) 03/29/2022   Cellulitis of right lower extremity 03/29/2022   Restless leg syndrome 02/08/2022   Hypothyroidism 02/08/2022   Obesity (BMI 30-39.9) 02/08/2022   Insomnia 02/08/2022   Anxiety 02/08/2022   Poor appetite 02/08/2022   S/P reverse total shoulder arthroplasty, 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   Chronic diastolic heart failure (HCC) 08/09/2016   COPD (chronic obstructive pulmonary disease) (HCC) 08/09/2016   Pulmonary infiltrates    Pneumonia due to Streptococcus pneumoniae (HCC)    Fever    Community acquired pneumonia of right lung    Palliative care by specialist    Goals of care,  counseling/discussion    Pressure injury of skin 07/03/2016   Acute respiratory failure with hypoxia (HCC) 07/02/2016   Acute on chronic diastolic CHF (congestive heart failure) (HCC) 07/21/2015   COPD exacerbation (HCC) 07/21/2015   Acute renal insufficiency 07/21/2015   Hyperglycemia 07/21/2015   HTN (hypertension) 07/21/2015   S/P shoulder replacement 07/02/2013    PCP: Lynnea Ferrier, MD  REFERRING PROVIDER: Lynnea Ferrier, MD  REFERRING DIAG: Lumbar DDD, Neuropathy  Rationale for Evaluation and Treatment: Rehabilitation  THERAPY DIAG:  Other low back pain  Muscle weakness (generalized)  Gait difficulty  Imbalance  Abnormal posture  ONSET DATE:  03/20/2019  SUBJECTIVE:                                                                                                                                                                                           SUBJECTIVE STATEMENT: Pt. Reports chronic h/o lumbar stenosis/ back pain.  Pt. Reports 8/10 low back pain first thing in the morning and uses heating pad/ ibuprofen to decrease back pain to 1/10 after about 2 hours.  Pt. Reports no recent falls and using elevated RW for short distance ambulation and w/c for community and some household mobility.  Pt. States she has poor standing/walking endurance and is very slow with movement, which is why she requires use of w/c.  Increase episodes of COPD since last PT tx. Session.  Pt. Has 2 weddings to attend on 10/26 and 11/16.    PERTINENT HISTORY:  See MD notes.    PAIN:  Are you having pain? Yes: NPRS scale: 8/10 Pain location: low back Pain description: aching Aggravating factors: lying supine/ getting out of bed.  Relieving factors: moist heat  PRECAUTIONS: Fall  RED FLAGS: None   WEIGHT BEARING RESTRICTIONS: No  FALLS:  Has patient fallen in last 6 months? No  LIVING ENVIRONMENT: Lives with: lives with their spouse Lives in: House/apartment Stairs: No Has following equipment at home: Environmental consultant - 2 wheeled, Wheelchair (manual), and Tour manager  OCCUPATION: Retired  PLOF: Requires assistive device for independence  PATIENT GOALS: Increase standing/walking endurance.  Decrease low back pain.  NEXT MD VISIT: PRN  OBJECTIVE:  Note: Objective measures were completed at Evaluation unless otherwise noted.  DIAGNOSTIC FINDINGS:  COMPARISON:  Lumbar spine MRI from 06/26/2020   FINDINGS: Normal alignment. The vertebral body heights are well preserved. No signs of acute fracture or subluxation. Moderate multilevel disc space narrowing and anterior and posterior spur formation. Facet arthropathy identified within the lower lumbar  spine. Aortic atherosclerosis. Moderate stool burden noted within the colon.   IMPRESSION: 1. No acute  findings. 2. Moderate multilevel degenerative disc disease and facet arthropathy.  PATIENT SURVEYS:  FOTO initial 40/ goal 36  SCREENING FOR RED FLAGS: Bowel or bladder incontinence: yes Spinal tumors: No Cauda equina syndrome: No Compression fracture: No Abdominal aneurysm: No  COGNITION: Overall cognitive status: Within functional limits for tasks assessed   SENSATION: WFL, moderate lower leg swelling noted.  MUSCLE LENGTH: No supine positioning today Hamstrings: NT Thomas test: NT   POSTURE: rounded shoulders, forward head, increased thoracic kyphosis, and flexed trunk   PALPATION: Tenderness over lumbar paraspinals (generalized).  LUMBAR ROM:   AROM eval  Flexion 50% limited  Extension 100% limited  Right lateral flexion 50% limited  Left lateral flexion 50% limited  Right rotation 50% limited  Left rotation 50% limited   (Blank rows = not tested)  LOWER EXTREMITY ROM:       B hip flexion/ knee extension limited.    LOWER EXTREMITY MMT:       B LE muscle strength grossly 4/5 MMT except hamstring 4+/5 MMT and hip flexion 4-/5 MMT  Ascends steps with L LE and heavy UE assist.   LUMBAR SPECIAL TESTS:  Next tx. Session.  No supine positioning today.  FUNCTIONAL TESTS:  5 times sit to stand: 22.13 sec. With use of armrests.  Pt. Limited with STS from gray chair without UE assist.   6 minute walk test: TBD  GAIT: Distance walked: in clinic with elevated RW Assistive device utilized: Walker - 2 wheeled Level of assistance: Modified independence Comments: Significant forward head/ flexed posture/ thoracic kyphosis.  Slow, controlled gait pattern.  Fear of L knee buckling with R LE swing through phase of gait.   TODAY'S TREATMENT:                                                                                                                               DATE: 01/31/2023    Subjective:  Pt. Continues to be limited by major stressors with family which has been really difficult mentally/physically.  Pt. Is going to grandson's wedding this weekend and has to get house ready for family guests.      There.ex.: Ambulates 74" with upright RW and supervision from car to Nustep.  Slow cadence and short step length.  PT cued pt. With more recip. Gait pattern/ consistent step pattern with fewer breaks, esp. During turning.    Nustep L4 10 min. B UE/LE (consistent cadence)- MH to back.  Sit to stands from gray chair with Airex to elevate position 10x (min. UE assist).   Seated 2# UE ex.: bicep curls/ chest press/ sh. Flexion 20x each.    Walking in //-bars:  forward/backwards/lateral 2 laps each.  Several episodes of pt. Getting stuck during L LE wt. Bearing/ R swing through phase of gait secondary to L knee buckling/ fear of buckling.    Ambulate 52 feet from gym to outside of clinic with elevated RW and cuing for  consistent recip. Gait pattern.  Pt. Challenged when stepping over door threshold/ rug at front of clinic.    No supine ex. today  PATIENT EDUCATION:  Education details: Daily walking/ posture Person educated: Patient Education method: Medical illustrator Education comprehension: verbalized understanding and returned demonstration  HOME EXERCISE PROGRAM: Will issue next tx. session  ASSESSMENT:  CLINICAL IMPRESSION:    Patient ambulated into clinic/gym from front of building with significant forward flexed posture and heavy forearm support of upright RW.  Extra time required and PT provided SBA/ cuing to increase recip. Step pattern/ cadence to improve efficiency with gait.  Several episodes of L knee buckling while walking in //-bars but able to self-correct with UE assist.  Pt. Stressed out with family health situation and PT encouraged pt. To focus on herself mentally/physically to better handle the situation.   Pt. Will  benefit from skilled PT services in combination with gym based ex. To improve LE strengthening/ pain-free mobility.   OBJECTIVE IMPAIRMENTS: Abnormal gait, decreased activity tolerance, decreased balance, decreased endurance, decreased mobility, difficulty walking, decreased ROM, decreased strength, hypomobility, increased edema, impaired flexibility, improper body mechanics, postural dysfunction, and pain.   ACTIVITY LIMITATIONS: bending, sitting, standing, squatting, stairs, transfers, bed mobility, toileting, and locomotion level  PARTICIPATION LIMITATIONS: medication management  PERSONAL FACTORS: Fitness and Past/current experiences are also affecting patient's functional outcome.   REHAB POTENTIAL: Good  CLINICAL DECISION MAKING: Evolving/moderate complexity  EVALUATION COMPLEXITY: Moderate   GOALS: Goals reviewed with patient? Yes  SHORT TERM GOALS: Target date: 01/08/23  Pt. Will be independent with HEP to increase B LE muscle strength 1/2 muscle grade to improve transfer/ safety with walking.  Baseline:  see above Goal status: Not met  2.  Pt. Will complete with no rest breaks to improve walking endurance.   Baseline: TBD.  10/22: not completed at this time secondary to increase mid-back pain.   Goal status: On-going   LONG TERM GOALS: Target date: 02/12/23  Pt. Will increase FOTO to 51 to improve pain-free functional mobility.  Baseline:  initial 40 Goal status: INITIAL  2.  Pt. Able to stand from 21# chair height with no UE assist or low back pain to improve transfers/ independence with daily activity.   Baseline: benefits from armrests/ UE assist.  Goal status: INITIAL  3.  Pt. Able to return to gym based ex. Program with no limitations or c/o low back pain.   Baseline: currently not exercising or going to gym. Goal status: INITIAL   PLAN:  PT FREQUENCY: 2x/week  PT DURATION: 8 weeks  PLANNED INTERVENTIONS: Therapeutic exercises, Therapeutic  activity, Neuromuscular re-education, Balance training, Gait training, Patient/Family education, Self Care, Joint mobilization, DME instructions, Dry Needling, Electrical stimulation, Cryotherapy, Moist heat, and Manual therapy.  PLAN FOR NEXT SESSION: Discuss weekend activities/ wedding.    Cammie Mcgee, PT, DPT # [phone] 01/31/2023, 10:03 AM

## 2023-02-05 ENCOUNTER — Ambulatory Visit: Payer: Medicare HMO | Admitting: Physical Therapy

## 2023-02-05 ENCOUNTER — Encounter: Payer: Self-pay | Admitting: Physical Therapy

## 2023-02-05 DIAGNOSIS — R2689 Other abnormalities of gait and mobility: Secondary | ICD-10-CM

## 2023-02-05 DIAGNOSIS — R269 Unspecified abnormalities of gait and mobility: Secondary | ICD-10-CM | POA: Diagnosis not present

## 2023-02-05 DIAGNOSIS — R293 Abnormal posture: Secondary | ICD-10-CM

## 2023-02-05 DIAGNOSIS — M6281 Muscle weakness (generalized): Secondary | ICD-10-CM | POA: Diagnosis not present

## 2023-02-05 DIAGNOSIS — M5459 Other low back pain: Secondary | ICD-10-CM | POA: Diagnosis not present

## 2023-02-05 NOTE — Therapy (Signed)
OUTPATIENT PHYSICAL THERAPY THORACOLUMBAR TREATMENT  Patient Name: Hannah Powell MRN: [id number] DOB:[birth year], 77 y.o., female Today's Date: 02/05/2023  END OF SESSION:  PT End of Session - 02/05/23 0837     Visit Number 12    Number of Visits 16    Date for PT Re-Evaluation 02/12/23    PT Start Time 0837    PT Stop Time 0945    PT Time Calculation (min) 68 min    Activity Tolerance Patient tolerated treatment well    Behavior During Therapy [facility] for tasks assessed/performed             Past Medical History:  Diagnosis Date   Anxiety    Arthritis    CHF (congestive heart failure) (HCC)    COPD (chronic obstructive pulmonary disease) (HCC)    Dysplastic nevus 04/22/2019   R abdomen parallel to sup umbilicus - mod   Dysplastic nevus 04/22/2019   R low back above waistline - mild    Edema    feet/legs   Hypertension    dr Darryll Capers      Hypothyroidism    Leg weakness, bilateral    Osteoporosis    Oxygen deficit    2l with bipap at night   RLS (restless legs syndrome)    Shortness of breath    Sleep apnea    bipap   Wheezing    Past Surgical History:  Procedure Laterality Date   BACK SURGERY     cervical   BREAST BIOPSY Left    needle bx-neg   CATARACT EXTRACTION W/PHACO Left 05/07/2017   Procedure: CATARACT EXTRACTION PHACO AND INTRAOCULAR LENS PLACEMENT (IOC);  Surgeon: Galen Manila, MD;  Location: [facility] ORS;  Service: Ophthalmology;  Laterality: Left;  [place] 00:48.7 AP% 11.3 CDE 5.46 Fluid Pack Lot # [id number]   CATARACT EXTRACTION W/PHACO Right 04/23/2017   Procedure: CATARACT EXTRACTION PHACO AND INTRAOCULAR LENS PLACEMENT (IOC);  Surgeon: Galen Manila, MD;  Location: [facility] OR[facility];  Service: Ophthalmology;  Laterality: Right;  [place] 00:29 AP% 12.8 CDE 3.79 FLUID PACK LOT # [id number]   CESAREAN SECTION     FOOT ARTHROPLASTY     JOINT REPLACEMENT     x2 tkr   TOTAL SHOULDER ARTHROPLASTY Left 07/02/2013   Procedure: LEFT TOTAL SHOULDER  ARTHROPLASTY;  Surgeon: Senaida Lange, MD;  Location: [facility] OR;  Service: Orthopedics;  Laterality: Left;   TOTAL SHOULDER ARTHROPLASTY Right 04/10/2018   Procedure: TOTAL REVERSE SHOULDER ARTHROPLASTY;  Surgeon: Francena Hanly, MD;  Location: [facility];  Service: Orthopedics;  Laterality: Right;    Patient Active Problem List   Diagnosis Date Noted   Bilateral leg pain 08/29/2022   Cervical radiculopathy 08/28/2022   CHF exacerbation (HCC) 08/26/2022   Swelling of lower extremity 08/26/2022   Acute on chronic respiratory failure with hypoxia and hypercapnia (HCC) 03/29/2022   Chronic diastolic CHF (congestive heart failure) (HCC) 03/29/2022   Cellulitis of right lower extremity 03/29/2022   Restless leg syndrome 02/08/2022   Hypothyroidism 02/08/2022   Obesity (BMI 30-39.9) 02/08/2022   Insomnia 02/08/2022   Anxiety 02/08/2022   Poor appetite 02/08/2022   S/P reverse total shoulder arthroplasty, 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   Chronic diastolic heart failure (HCC) 08/09/2016   COPD (chronic obstructive pulmonary disease) (HCC) 08/09/2016   Pulmonary infiltrates    Pneumonia due to Streptococcus pneumoniae (HCC)    Fever    Community acquired pneumonia of right lung    Palliative care by specialist    Goals of care,  counseling/discussion    Pressure injury of skin 07/03/2016   Acute respiratory failure with hypoxia (HCC) 07/02/2016   Acute on chronic diastolic CHF (congestive heart failure) (HCC) 07/21/2015   COPD exacerbation (HCC) 07/21/2015   Acute renal insufficiency 07/21/2015   Hyperglycemia 07/21/2015   HTN (hypertension) 07/21/2015   S/P shoulder replacement 07/02/2013    PCP: Lynnea Ferrier, MD  REFERRING PROVIDER: Lynnea Ferrier, MD  REFERRING DIAG: Lumbar DDD, Neuropathy  Rationale for Evaluation and Treatment: Rehabilitation  THERAPY DIAG:  Other low back pain  Muscle weakness (generalized)  Gait difficulty  Imbalance  Abnormal posture  ONSET DATE:  03/20/2019  SUBJECTIVE:                                                                                                                                                                                           SUBJECTIVE STATEMENT: Pt. Reports chronic h/o lumbar stenosis/ back pain.  Pt. Reports 8/10 low back pain first thing in the morning and uses heating pad/ ibuprofen to decrease back pain to 1/10 after about 2 hours.  Pt. Reports no recent falls and using elevated RW for short distance ambulation and w/c for community and some household mobility.  Pt. States she has poor standing/walking endurance and is very slow with movement, which is why she requires use of w/c.  Increase episodes of COPD since last PT tx. Session.  Pt. Has 2 weddings to attend on 10/26 and 11/16.    PERTINENT HISTORY:  See MD notes.    PAIN:  Are you having pain? Yes: NPRS scale: 8/10 Pain location: low back Pain description: aching Aggravating factors: lying supine/ getting out of bed.  Relieving factors: moist heat  PRECAUTIONS: Fall  RED FLAGS: None   WEIGHT BEARING RESTRICTIONS: No  FALLS:  Has patient fallen in last 6 months? No  LIVING ENVIRONMENT: Lives with: lives with their spouse Lives in: House/apartment Stairs: No Has following equipment at home: Environmental consultant - 2 wheeled, Wheelchair (manual), and Tour manager  OCCUPATION: Retired  PLOF: Requires assistive device for independence  PATIENT GOALS: Increase standing/walking endurance.  Decrease low back pain.  NEXT MD VISIT: PRN  OBJECTIVE:  Note: Objective measures were completed at Evaluation unless otherwise noted.  DIAGNOSTIC FINDINGS:  COMPARISON:  Lumbar spine MRI from 06/26/2020   FINDINGS: Normal alignment. The vertebral body heights are well preserved. No signs of acute fracture or subluxation. Moderate multilevel disc space narrowing and anterior and posterior spur formation. Facet arthropathy identified within the lower lumbar  spine. Aortic atherosclerosis. Moderate stool burden noted within the colon.   IMPRESSION: 1. No acute  findings. 2. Moderate multilevel degenerative disc disease and facet arthropathy.  PATIENT SURVEYS:  FOTO initial 40/ goal 62  SCREENING FOR RED FLAGS: Bowel or bladder incontinence: yes Spinal tumors: No Cauda equina syndrome: No Compression fracture: No Abdominal aneurysm: No  COGNITION: Overall cognitive status: Within functional limits for tasks assessed   SENSATION: WFL, moderate lower leg swelling noted.  MUSCLE LENGTH: No supine positioning today Hamstrings: NT Thomas test: NT   POSTURE: rounded shoulders, forward head, increased thoracic kyphosis, and flexed trunk   PALPATION: Tenderness over lumbar paraspinals (generalized).  LUMBAR ROM:   AROM eval  Flexion 50% limited  Extension 100% limited  Right lateral flexion 50% limited  Left lateral flexion 50% limited  Right rotation 50% limited  Left rotation 50% limited   (Blank rows = not tested)  LOWER EXTREMITY ROM:       B hip flexion/ knee extension limited.    LOWER EXTREMITY MMT:       B LE muscle strength grossly 4/5 MMT except hamstring 4+/5 MMT and hip flexion 4-/5 MMT  Ascends steps with L LE and heavy UE assist.   LUMBAR SPECIAL TESTS:  Next tx. Session.  No supine positioning today.  FUNCTIONAL TESTS:  5 times sit to stand: 22.13 sec. With use of armrests.  Pt. Limited with STS from gray chair without UE assist.   6 minute walk test: TBD  GAIT: Distance walked: in clinic with elevated RW Assistive device utilized: Walker - 2 wheeled Level of assistance: Modified independence Comments: Significant forward head/ flexed posture/ thoracic kyphosis.  Slow, controlled gait pattern.  Fear of L knee buckling with R LE swing through phase of gait.   TODAY'S TREATMENT:                                                                                                                               DATE: 02/05/2023    Subjective:  Pt. Reports minimal pain in low back (4/10 currently).   Pt. Had a good time at grandson's wedding this past weekend.  Pt. States she is having a heart cath on 12/17 at [facility].  Significant B lower leg swelling and PT discussed the use of compression stockings.  Pt states the stockings are difficult to get on when legs are really swollen like they are this morning.    There.ex.: Ambulates 74" with upright RW and supervision from car to Nustep.  Slow cadence and short step length.  PT cued pt. With more recip. Gait pattern/ consistent step pattern with fewer breaks, esp. During turning.    Nustep L4 10 min. B UE/LE (consistent cadence)- MH to back.  Airex steps in //-bar 6x on L/R with heavy UE assist/ extra time and focus.  Pt. Cued to change head position and use mirror feedback.    Walking in //-bars:  forward 4 laps each.  Focus on turning with more efficient step pattern at the end of //-bars.  Moderate cuing to correct upright posture of head.     Seated 1# sh. Flexion/ chest press 20x each/ standing 2# bicep curls 20x each.   Sit to stands from gray chair with Airex to elevate position 10x (min. UE assist).   Seated LAQ/ marching 15x2.    Ambulate 58 feet from gym to outside of clinic with elevated RW and cuing for consistent recip. Gait pattern.  Pt. Challenged when stepping over door threshold/ rug at front of clinic.    No supine ex. today  PATIENT EDUCATION:  Education details: Daily walking/ posture Person educated: Patient Education method: Medical illustrator Education comprehension: verbalized understanding and returned demonstration  HOME EXERCISE PROGRAM: Will issue next tx. session  ASSESSMENT:  CLINICAL IMPRESSION:    Patient ambulated into clinic/gym from front of building with significant forward flexed posture and heavy forearm support of upright RW.  Extra time required and PT provided SBA/ cuing to increase recip.  Step pattern/ cadence to improve efficiency with gait.   Improvement noted with turning at end of //-bars with less steps taken but poor upright posture.  Pt. Cued t/o tx. Session to increase upright posture during seated/standing there.ex. and during walking.  Pt. Stressed out with family health situation and PT encouraged pt. To focus on herself mentally/physically to better handle the situation.   Pt. Will benefit from skilled PT services in combination with gym based ex. To improve LE strengthening/ pain-free mobility.   OBJECTIVE IMPAIRMENTS: Abnormal gait, decreased activity tolerance, decreased balance, decreased endurance, decreased mobility, difficulty walking, decreased ROM, decreased strength, hypomobility, increased edema, impaired flexibility, improper body mechanics, postural dysfunction, and pain.   ACTIVITY LIMITATIONS: bending, sitting, standing, squatting, stairs, transfers, bed mobility, toileting, and locomotion level  PARTICIPATION LIMITATIONS: medication management  PERSONAL FACTORS: Fitness and Past/current experiences are also affecting patient's functional outcome.   REHAB POTENTIAL: Good  CLINICAL DECISION MAKING: Evolving/moderate complexity  EVALUATION COMPLEXITY: Moderate   GOALS: Goals reviewed with patient? Yes  SHORT TERM GOALS: Target date: 01/08/23  Pt. Will be independent with HEP to increase B LE muscle strength 1/2 muscle grade to improve transfer/ safety with walking.  Baseline:  see above Goal status: Not met  2.  Pt. Will complete with no rest breaks to improve walking endurance.   Baseline: TBD.  10/22: not completed at this time secondary to increase mid-back pain.   Goal status: On-going   LONG TERM GOALS: Target date: 02/12/23  Pt. Will increase FOTO to 51 to improve pain-free functional mobility.  Baseline:  initial 40 Goal status: INITIAL  2.  Pt. Able to stand from 21# chair height with no UE assist or low back pain to improve  transfers/ independence with daily activity.   Baseline: benefits from armrests/ UE assist.  Goal status: INITIAL  3.  Pt. Able to return to gym based ex. Program with no limitations or c/o low back pain.   Baseline: currently not exercising or going to gym. Goal status: INITIAL   PLAN:  PT FREQUENCY: 2x/week  PT DURATION: 8 weeks  PLANNED INTERVENTIONS: Therapeutic exercises, Therapeutic activity, Neuromuscular re-education, Balance training, Gait training, Patient/Family education, Self Care, Joint mobilization, DME instructions, Dry Needling, Electrical stimulation, Cryotherapy, Moist heat, and Manual therapy.  PLAN FOR NEXT SESSION: Check goals/ recert next tx.    Cammie Mcgee, PT, DPT # [phone] 02/05/2023, 9:46 AM

## 2023-02-07 ENCOUNTER — Ambulatory Visit: Payer: Medicare HMO | Admitting: Physical Therapy

## 2023-02-12 ENCOUNTER — Ambulatory Visit: Payer: Medicare HMO | Admitting: Physical Therapy

## 2023-02-12 ENCOUNTER — Encounter: Payer: Self-pay | Admitting: Physical Therapy

## 2023-02-12 DIAGNOSIS — R293 Abnormal posture: Secondary | ICD-10-CM

## 2023-02-12 DIAGNOSIS — R2689 Other abnormalities of gait and mobility: Secondary | ICD-10-CM | POA: Diagnosis not present

## 2023-02-12 DIAGNOSIS — M5459 Other low back pain: Secondary | ICD-10-CM | POA: Diagnosis not present

## 2023-02-12 DIAGNOSIS — R269 Unspecified abnormalities of gait and mobility: Secondary | ICD-10-CM

## 2023-02-12 DIAGNOSIS — M6281 Muscle weakness (generalized): Secondary | ICD-10-CM | POA: Diagnosis not present

## 2023-02-12 NOTE — Therapy (Signed)
OUTPATIENT PHYSICAL THERAPY THORACOLUMBAR TREATMENT  Patient Name: Hannah Powell MRN: [id number] DOB:03/06/1946, 77 y.o., female Today's Date: 02/12/2023  END OF SESSION:  PT End of Session - 02/12/23 0900     Visit Number 13    Number of Visits 16    Date for PT Re-Evaluation 02/12/23    PT Start Time 0900    PT Stop Time 0949    PT Time Calculation (min) 49 min    Activity Tolerance Patient tolerated treatment well    Behavior During Therapy WFL for tasks assessed/performed             Past Medical History:  Diagnosis Date   Anxiety    Arthritis    CHF (congestive heart failure) (HCC)    COPD (chronic obstructive pulmonary disease) (HCC)    Dysplastic nevus 04/22/2019   R abdomen parallel to sup umbilicus - mod   Dysplastic nevus 04/22/2019   R low back above waistline - mild    Edema    feet/legs   Hypertension    dr Darryll Capers      Hypothyroidism    Leg weakness, bilateral    Osteoporosis    Oxygen deficit    2l with bipap at night   RLS (restless legs syndrome)    Shortness of breath    Sleep apnea    bipap   Wheezing    Past Surgical History:  Procedure Laterality Date   BACK SURGERY     cervical   BREAST BIOPSY Left    needle bx-neg   CATARACT EXTRACTION W/PHACO Left 05/07/2017   Procedure: CATARACT EXTRACTION PHACO AND INTRAOCULAR LENS PLACEMENT (IOC);  Surgeon: Galen Manila, MD;  Location: [facility];  Service: Ophthalmology;  Laterality: Left;  [place] 00:48.7 AP% 11.3 CDE 5.46 Fluid Pack Lot # [id number]   CATARACT EXTRACTION W/PHACO Right 04/23/2017   Procedure: CATARACT EXTRACTION PHACO AND INTRAOCULAR LENS PLACEMENT (IOC);  Surgeon: Galen Manila, MD;  Location: [facility];  Service: Ophthalmology;  Laterality: Right;  [place] 00:29 AP% 12.8 CDE 3.79 FLUID PACK LOT # [id number]   CESAREAN SECTION     FOOT ARTHROPLASTY     JOINT REPLACEMENT     x2 tkr   TOTAL SHOULDER ARTHROPLASTY Left 07/02/2013   Procedure: LEFT TOTAL SHOULDER  ARTHROPLASTY;  Surgeon: Senaida Lange, MD;  Location: [facility];  Service: Orthopedics;  Laterality: Left;   TOTAL SHOULDER ARTHROPLASTY Right 04/10/2018   Procedure: TOTAL REVERSE SHOULDER ARTHROPLASTY;  Surgeon: Francena Hanly, MD;  Location: [facility];  Service: Orthopedics;  Laterality: Right;    Patient Active Problem List   Diagnosis Date Noted   Bilateral leg pain 08/29/2022   Cervical radiculopathy 08/28/2022   CHF exacerbation (HCC) 08/26/2022   Swelling of lower extremity 08/26/2022   Acute on chronic respiratory failure with hypoxia and hypercapnia (HCC) 03/29/2022   Chronic diastolic CHF (congestive heart failure) (HCC) 03/29/2022   Cellulitis of right lower extremity 03/29/2022   Restless leg syndrome 02/08/2022   Hypothyroidism 02/08/2022   Obesity (BMI 30-39.9) 02/08/2022   Insomnia 02/08/2022   Anxiety 02/08/2022   Poor appetite 02/08/2022   S/P reverse total shoulder arthroplasty, 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   Chronic diastolic heart failure (HCC) 08/09/2016   COPD (chronic obstructive pulmonary disease) (HCC) 08/09/2016   Pulmonary infiltrates    Pneumonia due to Streptococcus pneumoniae (HCC)    Fever    Community acquired pneumonia of right lung    Palliative care by specialist    Goals of care,  counseling/discussion    Pressure injury of skin 07/03/2016   Acute respiratory failure with hypoxia (HCC) 07/02/2016   Acute on chronic diastolic CHF (congestive heart failure) (HCC) 07/21/2015   COPD exacerbation (HCC) 07/21/2015   Acute renal insufficiency 07/21/2015   Hyperglycemia 07/21/2015   HTN (hypertension) 07/21/2015   S/P shoulder replacement 07/02/2013    PCP: Lynnea Ferrier, MD  REFERRING PROVIDER: Lynnea Ferrier, MD  REFERRING DIAG: Lumbar DDD, Neuropathy  Rationale for Evaluation and Treatment: Rehabilitation  THERAPY DIAG:  Other low back pain  Muscle weakness (generalized)  Gait difficulty  Imbalance  Abnormal posture  ONSET DATE:  03/20/2019  SUBJECTIVE:                                                                                                                                                                                           SUBJECTIVE STATEMENT: Pt. Reports chronic h/o lumbar stenosis/ back pain.  Pt. Reports 8/10 low back pain first thing in the morning and uses heating pad/ ibuprofen to decrease back pain to 1/10 after about 2 hours.  Pt. Reports no recent falls and using elevated RW for short distance ambulation and w/c for community and some household mobility.  Pt. States she has poor standing/walking endurance and is very slow with movement, which is why she requires use of w/c.  Increase episodes of COPD since last PT tx. Session.  Pt. Has 2 weddings to attend on 10/26 and 11/16.    PERTINENT HISTORY:  See MD notes.    PAIN:  Are you having pain? Yes: NPRS scale: 8/10 Pain location: low back Pain description: aching Aggravating factors: lying supine/ getting out of bed.  Relieving factors: moist heat  PRECAUTIONS: Fall  RED FLAGS: None   WEIGHT BEARING RESTRICTIONS: No  FALLS:  Has patient fallen in last 6 months? No  LIVING ENVIRONMENT: Lives with: lives with their spouse Lives in: House/apartment Stairs: No Has following equipment at home: Environmental consultant - 2 wheeled, Wheelchair (manual), and Tour manager  OCCUPATION: Retired  PLOF: Requires assistive device for independence  PATIENT GOALS: Increase standing/walking endurance.  Decrease low back pain.  NEXT MD VISIT: PRN  OBJECTIVE:  Note: Objective measures were completed at Evaluation unless otherwise noted.  DIAGNOSTIC FINDINGS:  COMPARISON:  Lumbar spine MRI from 06/26/2020   FINDINGS: Normal alignment. The vertebral body heights are well preserved. No signs of acute fracture or subluxation. Moderate multilevel disc space narrowing and anterior and posterior spur formation. Facet arthropathy identified within the lower lumbar  spine. Aortic atherosclerosis. Moderate stool burden noted within the colon.   IMPRESSION: 1. No acute  findings. 2. Moderate multilevel degenerative disc disease and facet arthropathy.  PATIENT SURVEYS:  FOTO initial 40/ goal 89  SCREENING FOR RED FLAGS: Bowel or bladder incontinence: yes Spinal tumors: No Cauda equina syndrome: No Compression fracture: No Abdominal aneurysm: No  COGNITION: Overall cognitive status: Within functional limits for tasks assessed   SENSATION: WFL, moderate lower leg swelling noted.  MUSCLE LENGTH: No supine positioning today Hamstrings: NT Thomas test: NT   POSTURE: rounded shoulders, forward head, increased thoracic kyphosis, and flexed trunk   PALPATION: Tenderness over lumbar paraspinals (generalized).  LUMBAR ROM:   AROM eval  Flexion 50% limited  Extension 100% limited  Right lateral flexion 50% limited  Left lateral flexion 50% limited  Right rotation 50% limited  Left rotation 50% limited   (Blank rows = not tested)  LOWER EXTREMITY ROM:       B hip flexion/ knee extension limited.    LOWER EXTREMITY MMT:       B LE muscle strength grossly 4/5 MMT except hamstring 4+/5 MMT and hip flexion 4-/5 MMT  Ascends steps with L LE and heavy UE assist.   LUMBAR SPECIAL TESTS:  Next tx. Session.  No supine positioning today.  FUNCTIONAL TESTS:  5 times sit to stand: 22.13 sec. With use of armrests.  Pt. Limited with STS from gray chair without UE assist.   6 minute walk test: TBD  GAIT: Distance walked: in clinic with elevated RW Assistive device utilized: Walker - 2 wheeled Level of assistance: Modified independence Comments: Significant forward head/ flexed posture/ thoracic kyphosis.  Slow, controlled gait pattern.  Fear of L knee buckling with R LE swing through phase of gait.   TODAY'S TREATMENT:                                                                                                                               DATE: 02/12/2023    Subjective:  Pt. Reports "a little bit of pain in hips" and states "legs are so weak".  Pt. Went to gym yesterday and rode bike for 30 minutes.  Pt. having a heart cath on 12/17 at [facility].  Significant B lower leg swelling and PT discussed the use of compression stockings.  Pt. Reports sore place on R lower leg.    There.ex.:  Ambulates 74" with upright RW and supervision from car to PT gym.  Slow cadence and short step length.  PT cued pt. With more recip. Gait pattern/ consistent step pattern with fewer breaks, esp. During turning.    Standing marching/ hip abduction/ extension/ heel and toe raises 20x in //-bars (mirror feedback to correct head/upright posture).     Seated with MH to low back: B shoulder flexion with wand/ chest press 20x.   Amb. In //-bars: forward/backwards 3 laps.  Significant B LE muscle fatigue during final lap.  1 episode of knee buckling but pt. Able to self-correct with UE assist in //-bars.  Pt. Fearful of knee buckling/ falling.   Amb. From //-bars to Nautilus: seated lat. Pull downs 50# with shoulder flexion 20x.    Nustep L4 10 min. B UE/LE (consistent cadence)- MH to back.  Seated LAQ/ marching 15x2.    Ambulate 58 feet from gym to outside of clinic with elevated RW and cuing for consistent recip. Gait pattern.  Pt. Challenged when stepping over door threshold/ rug at front of clinic.    No supine ex. today  PATIENT EDUCATION:  Education details: Daily walking/ posture Person educated: Patient Education method: Medical illustrator Education comprehension: verbalized understanding and returned demonstration  HOME EXERCISE PROGRAM: Will issue next tx. session  ASSESSMENT:  CLINICAL IMPRESSION:    Patient ambulated into clinic/gym from front of building with significant forward flexed posture and heavy forearm support of upright RW.  Extra time required and PT provided SBA/ cuing to increase recip. Step pattern/ cadence  to improve efficiency with gait.   Improvement noted with turning at end of //-bars with less steps taken but poor upright posture.  Pt. Cued t/o tx. Session to increase upright posture during seated/standing there.ex. and during walking.  Pt. Will benefit from skilled PT services in combination with gym based ex. To improve LE strengthening/ pain-free mobility.   OBJECTIVE IMPAIRMENTS: Abnormal gait, decreased activity tolerance, decreased balance, decreased endurance, decreased mobility, difficulty walking, decreased ROM, decreased strength, hypomobility, increased edema, impaired flexibility, improper body mechanics, postural dysfunction, and pain.   ACTIVITY LIMITATIONS: bending, sitting, standing, squatting, stairs, transfers, bed mobility, toileting, and locomotion level  PARTICIPATION LIMITATIONS: medication management  PERSONAL FACTORS: Fitness and Past/current experiences are also affecting patient's functional outcome.   REHAB POTENTIAL: Good  CLINICAL DECISION MAKING: Evolving/moderate complexity  EVALUATION COMPLEXITY: Moderate   GOALS: Goals reviewed with patient? Yes  SHORT TERM GOALS: Target date: 01/08/23  Pt. Will be independent with HEP to increase B LE muscle strength 1/2 muscle grade to improve transfer/ safety with walking.  Baseline:  see above Goal status: Not met  2.  Pt. Will complete with no rest breaks to improve walking endurance.   Baseline: TBD.  10/22: not completed at this time secondary to increase mid-back pain.   Goal status: On-going   LONG TERM GOALS: Target date: 02/12/23  Pt. Will increase FOTO to 51 to improve pain-free functional mobility.  Baseline:  initial 40 Goal status: INITIAL  2.  Pt. Able to stand from 21# chair height with no UE assist or low back pain to improve transfers/ independence with daily activity.   Baseline: benefits from armrests/ UE assist.  Goal status: INITIAL  3.  Pt. Able to return to gym based ex.  Program with no limitations or c/o low back pain.   Baseline: currently not exercising or going to gym. Goal status: INITIAL   PLAN:  PT FREQUENCY: 2x/week  PT DURATION: 8 weeks  PLANNED INTERVENTIONS: Therapeutic exercises, Therapeutic activity, Neuromuscular re-education, Balance training, Gait training, Patient/Family education, Self Care, Joint mobilization, DME instructions, Dry Needling, Electrical stimulation, Cryotherapy, Moist heat, and Manual therapy.  PLAN FOR NEXT SESSION: Check goals/ recert next tx.    Cammie Mcgee, PT, DPT # [phone] 02/12/2023, 12:44 PM

## 2023-02-19 ENCOUNTER — Ambulatory Visit: Payer: Medicare HMO | Admitting: Physical Therapy

## 2023-02-20 DIAGNOSIS — J449 Chronic obstructive pulmonary disease, unspecified: Secondary | ICD-10-CM | POA: Diagnosis not present

## 2023-02-21 ENCOUNTER — Ambulatory Visit: Payer: Medicare HMO | Attending: Internal Medicine | Admitting: Physical Therapy

## 2023-02-21 ENCOUNTER — Encounter: Payer: Self-pay | Admitting: Physical Therapy

## 2023-02-21 DIAGNOSIS — R262 Difficulty in walking, not elsewhere classified: Secondary | ICD-10-CM | POA: Diagnosis not present

## 2023-02-21 DIAGNOSIS — R269 Unspecified abnormalities of gait and mobility: Secondary | ICD-10-CM | POA: Diagnosis not present

## 2023-02-21 DIAGNOSIS — R2689 Other abnormalities of gait and mobility: Secondary | ICD-10-CM | POA: Diagnosis not present

## 2023-02-21 DIAGNOSIS — M5459 Other low back pain: Secondary | ICD-10-CM | POA: Diagnosis not present

## 2023-02-21 DIAGNOSIS — M6281 Muscle weakness (generalized): Secondary | ICD-10-CM | POA: Insufficient documentation

## 2023-02-21 DIAGNOSIS — R2681 Unsteadiness on feet: Secondary | ICD-10-CM | POA: Insufficient documentation

## 2023-02-21 DIAGNOSIS — R293 Abnormal posture: Secondary | ICD-10-CM | POA: Insufficient documentation

## 2023-02-21 NOTE — Therapy (Signed)
OUTPATIENT PHYSICAL THERAPY THORACOLUMBAR TREATMENT/ RECERTIFICATION  Patient Name: Hannah Powell MRN: [id number] DOB:[birth year], 77 y.o., female Today's Date: 02/22/2023  END OF SESSION:  PT End of Session - 02/21/23 0943     Visit Number 14    Number of Visits 30    Date for PT Re-Evaluation 04/18/23    PT Start Time 0943    PT Stop Time 1036    PT Time Calculation (min) 53 min    Activity Tolerance Patient tolerated treatment well    Behavior During Therapy [facility] for tasks assessed/performed             Past Medical History:  Diagnosis Date   Anxiety    Arthritis    CHF (congestive heart failure) (HCC)    COPD (chronic obstructive pulmonary disease) (HCC)    Dysplastic nevus 04/22/2019   R abdomen parallel to sup umbilicus - mod   Dysplastic nevus 04/22/2019   R low back above waistline - mild    Edema    feet/legs   Hypertension    dr Darryll Capers      Hypothyroidism    Leg weakness, bilateral    Osteoporosis    Oxygen deficit    2l with bipap at night   RLS (restless legs syndrome)    Shortness of breath    Sleep apnea    bipap   Wheezing    Past Surgical History:  Procedure Laterality Date   BACK SURGERY     cervical   BREAST BIOPSY Left    needle bx-neg   CATARACT EXTRACTION W/PHACO Left 05/07/2017   Procedure: CATARACT EXTRACTION PHACO AND INTRAOCULAR LENS PLACEMENT (IOC);  Surgeon: Galen Manila, MD;  Location: [facility] ORS;  Service: Ophthalmology;  Laterality: Left;  [place] 00:48.7 AP% 11.3 CDE 5.46 Fluid Pack Lot # [id number]   CATARACT EXTRACTION W/PHACO Right 04/23/2017   Procedure: CATARACT EXTRACTION PHACO AND INTRAOCULAR LENS PLACEMENT (IOC);  Surgeon: Galen Manila, MD;  Location: [facility] ORS;  Service: Ophthalmology;  Laterality: Right;  [place] 00:29 AP% 12.8 CDE 3.79 FLUID PACK LOT # [id number]   CESAREAN SECTION     FOOT ARTHROPLASTY     JOINT REPLACEMENT     x2 tkr   TOTAL SHOULDER ARTHROPLASTY Left 07/02/2013   Procedure: LEFT TOTAL  SHOULDER ARTHROPLASTY;  Surgeon: Senaida Lange, MD;  Location: [facility] OR;  Service: Orthopedics;  Laterality: Left;   TOTAL SHOULDER ARTHROPLASTY Right 04/10/2018   Procedure: TOTAL REVERSE SHOULDER ARTHROPLASTY;  Surgeon: Francena Hanly, MD;  Location: [facility];  Service: Orthopedics;  Laterality: Right;    Patient Active Problem List   Diagnosis Date Noted   Bilateral leg pain 08/29/2022   Cervical radiculopathy 08/28/2022   CHF exacerbation (HCC) 08/26/2022   Swelling of lower extremity 08/26/2022   Acute on chronic respiratory failure with hypoxia and hypercapnia (HCC) 03/29/2022   Chronic diastolic CHF (congestive heart failure) (HCC) 03/29/2022   Cellulitis of right lower extremity 03/29/2022   Restless leg syndrome 02/08/2022   Hypothyroidism 02/08/2022   Obesity (BMI 30-39.9) 02/08/2022   Insomnia 02/08/2022   Anxiety 02/08/2022   Poor appetite 02/08/2022   S/P reverse total shoulder arthroplasty, 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   Chronic diastolic heart failure (HCC) 08/09/2016   COPD (chronic obstructive pulmonary disease) (HCC) 08/09/2016   Pulmonary infiltrates    Pneumonia due to Streptococcus pneumoniae (HCC)    Fever    Community acquired pneumonia of right lung    Palliative care by specialist    Goals of  care, counseling/discussion    Pressure injury of skin 07/03/2016   Acute respiratory failure with hypoxia (HCC) 07/02/2016   Acute on chronic diastolic CHF (congestive heart failure) (HCC) 07/21/2015   COPD exacerbation (HCC) 07/21/2015   Acute renal insufficiency 07/21/2015   Hyperglycemia 07/21/2015   HTN (hypertension) 07/21/2015   S/P shoulder replacement 07/02/2013    PCP: Lynnea Ferrier, MD  REFERRING PROVIDER: Lynnea Ferrier, MD  REFERRING DIAG: Lumbar DDD, Neuropathy  Rationale for Evaluation and Treatment: Rehabilitation  THERAPY DIAG:  Other low back pain  Muscle weakness (generalized)  Gait difficulty  Imbalance  Abnormal posture  ONSET  DATE: 03/20/2019  SUBJECTIVE:                                                                                                                                                                                           SUBJECTIVE STATEMENT: Pt. Reports chronic h/o lumbar stenosis/ back pain.  Pt. Reports 8/10 low back pain first thing in the morning and uses heating pad/ ibuprofen to decrease back pain to 1/10 after about 2 hours.  Pt. Reports no recent falls and using elevated RW for short distance ambulation and w/c for community and some household mobility.  Pt. States she has poor standing/walking endurance and is very slow with movement, which is why she requires use of w/c.  Increase episodes of COPD since last PT tx. Session.  Pt. Has 2 weddings to attend on 10/26 and 11/16.    PERTINENT HISTORY:  See MD notes.    PAIN:  Are you having pain? Yes: NPRS scale: 8/10 Pain location: low back Pain description: aching Aggravating factors: lying supine/ getting out of bed.  Relieving factors: moist heat  PRECAUTIONS: Fall  RED FLAGS: None   WEIGHT BEARING RESTRICTIONS: No  FALLS:  Has patient fallen in last 6 months? No  LIVING ENVIRONMENT: Lives with: lives with their spouse Lives in: House/apartment Stairs: No Has following equipment at home: Environmental consultant - 2 wheeled, Wheelchair (manual), and Tour manager  OCCUPATION: Retired  PLOF: Requires assistive device for independence  PATIENT GOALS: Increase standing/walking endurance.  Decrease low back pain.  NEXT MD VISIT: PRN  OBJECTIVE:  Note: Objective measures were completed at Evaluation unless otherwise noted.  DIAGNOSTIC FINDINGS:  COMPARISON:  Lumbar spine MRI from 06/26/2020   FINDINGS: Normal alignment. The vertebral body heights are well preserved. No signs of acute fracture or subluxation. Moderate multilevel disc space narrowing and anterior and posterior spur formation. Facet arthropathy identified within the lower  lumbar spine. Aortic atherosclerosis. Moderate stool burden noted within the colon.   IMPRESSION: 1. No  acute findings. 2. Moderate multilevel degenerative disc disease and facet arthropathy.  PATIENT SURVEYS:  FOTO initial 40/ goal 29  SCREENING FOR RED FLAGS: Bowel or bladder incontinence: yes Spinal tumors: No Cauda equina syndrome: No Compression fracture: No Abdominal aneurysm: No  COGNITION: Overall cognitive status: Within functional limits for tasks assessed   SENSATION: WFL, moderate lower leg swelling noted.  MUSCLE LENGTH: No supine positioning today Hamstrings: NT Thomas test: NT   POSTURE: rounded shoulders, forward head, increased thoracic kyphosis, and flexed trunk   PALPATION: Tenderness over lumbar paraspinals (generalized).  LUMBAR ROM:   AROM eval  Flexion 50% limited  Extension 100% limited  Right lateral flexion 50% limited  Left lateral flexion 50% limited  Right rotation 50% limited  Left rotation 50% limited   (Blank rows = not tested)  LOWER EXTREMITY ROM:       B hip flexion/ knee extension limited.    LOWER EXTREMITY MMT:       B LE muscle strength grossly 4/5 MMT except hamstring 4+/5 MMT and hip flexion 4-/5 MMT  Ascends steps with L LE and heavy UE assist.   LUMBAR SPECIAL TESTS:  Next tx. Session.  No supine positioning today.  FUNCTIONAL TESTS:  5 times sit to stand: 22.13 sec. With use of armrests.  Pt. Limited with STS from gray chair without UE assist.   6 minute walk test: TBD  GAIT: Distance walked: in clinic with elevated RW Assistive device utilized: Walker - 2 wheeled Level of assistance: Modified independence Comments: Significant forward head/ flexed posture/ thoracic kyphosis.  Slow, controlled gait pattern.  Fear of L knee buckling with R LE swing through phase of gait.   TODAY'S TREATMENT:                                                                                                                               DATE: 02/22/2023    Subjective:  Pt. Reports 7/10 R hip pain walking into PT clinic and states "legs are so weak".  Pt. Had a difficult week due to dtr. Having a stent placed in bile duct.  Pt. Went to hospital with dtr. And has not been to gym this week.  Pt. having a heart cath on 12/17 at [facility].  Significant B lower leg swelling and PT discussed the use of compression stockings.  Pt. Had a pulmonary function test yesterday with improvement reported.  No change in medications.    There.ex.:  Ambulates 68" with upright RW and supervision from car to PT gym.  Slow cadence and short step length.  PT cued pt. With more recip. Gait pattern/ consistent step pattern with fewer breaks, esp. During turning.    Nustep L4 at seat position 9 for 10+ min. B UE/LE (consistent cadence)- MH to back.  Averaging 64 SPM.  O2 sat: 90%.  HR: 78 bpm.  Seated B shoulder flexion/ abduction/ chest press with wand 20x.  Banker  and moderate verbal/tactile cuing to prevent L lateral lean.  Tactile/verbal cuing for posture correction.    Standing marching/ hip abduction/ extension/ heel and toe raises 20x in //-bars (mirror feedback to correct head/upright posture).  O2 sat: 95%.       Sit to stands from gray chair in front of //-bars/ mirror 5x.    Seated LAQ/ marching 15x2.    Ambulate 58 feet from gym to outside of clinic with elevated RW and cuing for consistent recip. Gait pattern.  Pt. Challenged when stepping over door threshold/ rug at front of clinic.    No supine ex. Today  Reassessment of PT goals.    PATIENT EDUCATION:  Education details: Daily walking/ posture Person educated: Patient Education method: Medical illustrator Education comprehension: verbalized understanding and returned demonstration  HOME EXERCISE PROGRAM: See handouts  ASSESSMENT:  CLINICAL IMPRESSION:    Patient ambulated into clinic/gym from front of building with significant forward flexed posture and  heavy forearm support of upright RW.  Extra time required and PT provided SBA/ cuing to increase recip. Step pattern/ cadence to improve efficiency with gait.   Improvement noted with turning at end of //-bars with less steps taken but poor upright posture.  Pt. Cued t/o tx. Session to increase upright posture during seated/standing there.ex. and during walking.  Pt. Will benefit from skilled PT services in combination with gym based ex. To improve LE strengthening/ pain-free mobility.   OBJECTIVE IMPAIRMENTS: Abnormal gait, decreased activity tolerance, decreased balance, decreased endurance, decreased mobility, difficulty walking, decreased ROM, decreased strength, hypomobility, increased edema, impaired flexibility, improper body mechanics, postural dysfunction, and pain.   ACTIVITY LIMITATIONS: bending, sitting, standing, squatting, stairs, transfers, bed mobility, toileting, and locomotion level  PARTICIPATION LIMITATIONS: medication management  PERSONAL FACTORS: Fitness and Past/current experiences are also affecting patient's functional outcome.   REHAB POTENTIAL: Good  CLINICAL DECISION MAKING: Evolving/moderate complexity  EVALUATION COMPLEXITY: Moderate   GOALS: Goals reviewed with patient? Yes  SHORT TERM GOALS: Target date: 03/13/23  Pt. Will be independent with HEP to increase B LE muscle strength 1/2 muscle grade to improve transfer/ safety with walking.  Baseline:  see above Goal status: Not met  2.  Pt. Will complete with no rest breaks to improve walking endurance.   Baseline: TBD.  12/5: not completed at this time secondary to increase mid-back pain and inability to walk 6 minutes.    Goal status: On-going   LONG TERM GOALS: Target date: 04/18/23  Pt. Will increase FOTO to 51 to improve pain-free functional mobility.  Baseline:  initial 40 Goal status: On-going  2.  Pt. Able to stand from 21# chair height with no UE assist or low back pain to improve  transfers/ independence with daily activity.   Baseline: benefits from armrests/ UE assist.  Goal status: Not met  3.  Pt. Able to return to gym based ex. Program with no limitations or c/o low back pain.   Baseline: currently not exercising or going to gym.  12/5: pt. Returned to gym for a few weeks prior to family issues/ no recent gym visits. Goal status: Partially met   PLAN:  PT FREQUENCY: 2x/week  PT DURATION: 8 weeks  PLANNED INTERVENTIONS: Therapeutic exercises, Therapeutic activity, Neuromuscular re-education, Balance training, Gait training, Patient/Family education, Self Care, Joint mobilization, DME instructions, Dry Needling, Electrical stimulation, Cryotherapy, Moist heat, and Manual therapy.  PLAN FOR NEXT SESSION: Standing there.ex./ posture correction with mirror feedback.      Kayleen Memos  Christel Mormon, PT, DPT # [id number] 02/22/2023, 9:39 AM

## 2023-02-26 ENCOUNTER — Ambulatory Visit: Payer: Medicare HMO

## 2023-02-26 DIAGNOSIS — R269 Unspecified abnormalities of gait and mobility: Secondary | ICD-10-CM | POA: Diagnosis not present

## 2023-02-26 DIAGNOSIS — R2689 Other abnormalities of gait and mobility: Secondary | ICD-10-CM | POA: Diagnosis not present

## 2023-02-26 DIAGNOSIS — M6281 Muscle weakness (generalized): Secondary | ICD-10-CM

## 2023-02-26 DIAGNOSIS — R293 Abnormal posture: Secondary | ICD-10-CM

## 2023-02-26 DIAGNOSIS — M5459 Other low back pain: Secondary | ICD-10-CM

## 2023-02-26 DIAGNOSIS — R2681 Unsteadiness on feet: Secondary | ICD-10-CM | POA: Diagnosis not present

## 2023-02-26 DIAGNOSIS — R262 Difficulty in walking, not elsewhere classified: Secondary | ICD-10-CM | POA: Diagnosis not present

## 2023-02-26 NOTE — Therapy (Signed)
OUTPATIENT PHYSICAL THERAPY THORACOLUMBAR TREATMENT/ RECERTIFICATION  Patient Name: Hannah Powell MRN: [id number] DOB:[birth year], 77 y.o., female Today's Date: 02/26/2023  END OF SESSION:  PT End of Session - 02/26/23 1249     Visit Number 15    Number of Visits 30    Date for PT Re-Evaluation 04/18/23    PT Start Time 0955    PT Stop Time 1030    PT Time Calculation (min) 35 min    Equipment Utilized During Treatment Gait belt    Activity Tolerance Patient tolerated treatment well    Behavior During Therapy WFL for tasks assessed/performed              Past Medical History:  Diagnosis Date   Anxiety    Arthritis    CHF (congestive heart failure) (HCC)    COPD (chronic obstructive pulmonary disease) (HCC)    Dysplastic nevus 04/22/2019   R abdomen parallel to sup umbilicus - mod   Dysplastic nevus 04/22/2019   R low back above waistline - mild    Edema    feet/legs   Hypertension    dr Darryll Capers      Hypothyroidism    Leg weakness, bilateral    Osteoporosis    Oxygen deficit    2l with bipap at night   RLS (restless legs syndrome)    Shortness of breath    Sleep apnea    bipap   Wheezing    Past Surgical History:  Procedure Laterality Date   BACK SURGERY     cervical   BREAST BIOPSY Left    needle bx-neg   CATARACT EXTRACTION W/PHACO Left 05/07/2017   Procedure: CATARACT EXTRACTION PHACO AND INTRAOCULAR LENS PLACEMENT (IOC);  Surgeon: Galen Manila, MD;  Location: [facility];  Service: Ophthalmology;  Laterality: Left;  [place] 00:48.7 AP% 11.3 CDE 5.46 Fluid Pack Lot # [id number]   CATARACT EXTRACTION W/PHACO Right 04/23/2017   Procedure: CATARACT EXTRACTION PHACO AND INTRAOCULAR LENS PLACEMENT (IOC);  Surgeon: Galen Manila, MD;  Location: [facility];  Service: Ophthalmology;  Laterality: Right;  [place] 00:29 AP% 12.8 CDE 3.79 FLUID PACK LOT # [id number]   CESAREAN SECTION     FOOT ARTHROPLASTY     JOINT REPLACEMENT     x2 tkr   TOTAL SHOULDER  ARTHROPLASTY Left 07/02/2013   Procedure: LEFT TOTAL SHOULDER ARTHROPLASTY;  Surgeon: Senaida Lange, MD;  Location: [facility];  Service: Orthopedics;  Laterality: Left;   TOTAL SHOULDER ARTHROPLASTY Right 04/10/2018   Procedure: TOTAL REVERSE SHOULDER ARTHROPLASTY;  Surgeon: Francena Hanly, MD;  Location: [facility];  Service: Orthopedics;  Laterality: Right;    Patient Active Problem List   Diagnosis Date Noted   Bilateral leg pain 08/29/2022   Cervical radiculopathy 08/28/2022   CHF exacerbation (HCC) 08/26/2022   Swelling of lower extremity 08/26/2022   Acute on chronic respiratory failure with hypoxia and hypercapnia (HCC) 03/29/2022   Chronic diastolic CHF (congestive heart failure) (HCC) 03/29/2022   Cellulitis of right lower extremity 03/29/2022   Restless leg syndrome 02/08/2022   Hypothyroidism 02/08/2022   Obesity (BMI 30-39.9) 02/08/2022   Insomnia 02/08/2022   Anxiety 02/08/2022   Poor appetite 02/08/2022   S/P reverse total shoulder arthroplasty, 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   Chronic diastolic heart failure (HCC) 08/09/2016   COPD (chronic obstructive pulmonary disease) (HCC) 08/09/2016   Pulmonary infiltrates    Pneumonia due to Streptococcus pneumoniae (HCC)    Fever    Community acquired pneumonia of right lung  Palliative care by specialist    Goals of care, counseling/discussion    Pressure injury of skin 07/03/2016   Acute respiratory failure with hypoxia (HCC) 07/02/2016   Acute on chronic diastolic CHF (congestive heart failure) (HCC) 07/21/2015   COPD exacerbation (HCC) 07/21/2015   Acute renal insufficiency 07/21/2015   Hyperglycemia 07/21/2015   HTN (hypertension) 07/21/2015   S/P shoulder replacement 07/02/2013    PCP: Lynnea Ferrier, MD  REFERRING PROVIDER: Lynnea Ferrier, MD  REFERRING DIAG: Lumbar DDD, Neuropathy  Rationale for Evaluation and Treatment: Rehabilitation  THERAPY DIAG:  Difficulty in walking, not elsewhere classified  Muscle  weakness (generalized)  Other low back pain  Imbalance  Unsteadiness on feet  Gait difficulty  Abnormal posture  ONSET DATE: 03/20/2019  SUBJECTIVE:                                                                                                                                                                                           SUBJECTIVE STATEMENT: Pt. Reports chronic h/o lumbar stenosis/ back pain.  Pt. Reports 8/10 low back pain first thing in the morning and uses heating pad/ ibuprofen to decrease back pain to 1/10 after about 2 hours.  Pt. Reports no recent falls and using elevated RW for short distance ambulation and w/c for community and some household mobility.  Pt. States she has poor standing/walking endurance and is very slow with movement, which is why she requires use of w/c.  Increase episodes of COPD since last PT tx. Session.  Pt. Has 2 weddings to attend on 10/26 and 11/16.    PERTINENT HISTORY:  See MD notes.    PAIN:  Are you having pain? Yes: NPRS scale: 8/10 Pain location: low back Pain description: aching Aggravating factors: lying supine/ getting out of bed.  Relieving factors: moist heat  PRECAUTIONS: Fall  RED FLAGS: None   WEIGHT BEARING RESTRICTIONS: No  FALLS:  Has patient fallen in last 6 months? No  LIVING ENVIRONMENT: Lives with: lives with their spouse Lives in: House/apartment Stairs: No Has following equipment at home: Environmental consultant - 2 wheeled, Wheelchair (manual), and Tour manager  OCCUPATION: Retired  PLOF: Requires assistive device for independence  PATIENT GOALS: Increase standing/walking endurance.  Decrease low back pain.  NEXT MD VISIT: PRN  OBJECTIVE:  Note: Objective measures were completed at Evaluation unless otherwise noted.  DIAGNOSTIC FINDINGS:  COMPARISON:  Lumbar spine MRI from 06/26/2020   FINDINGS: Normal alignment. The vertebral body heights are well preserved. No signs of acute fracture or subluxation.  Moderate multilevel disc space narrowing and anterior and posterior spur formation. Facet arthropathy  identified within the lower lumbar spine. Aortic atherosclerosis. Moderate stool burden noted within the colon.   IMPRESSION: 1. No acute findings. 2. Moderate multilevel degenerative disc disease and facet arthropathy.  PATIENT SURVEYS:  FOTO initial 40/ goal 26  SCREENING FOR RED FLAGS: Bowel or bladder incontinence: yes Spinal tumors: No Cauda equina syndrome: No Compression fracture: No Abdominal aneurysm: No  COGNITION: Overall cognitive status: Within functional limits for tasks assessed   SENSATION: WFL, moderate lower leg swelling noted.  MUSCLE LENGTH: No supine positioning today Hamstrings: NT Thomas test: NT   POSTURE: rounded shoulders, forward head, increased thoracic kyphosis, and flexed trunk   PALPATION: Tenderness over lumbar paraspinals (generalized).  LUMBAR ROM:   AROM eval  Flexion 50% limited  Extension 100% limited  Right lateral flexion 50% limited  Left lateral flexion 50% limited  Right rotation 50% limited  Left rotation 50% limited   (Blank rows = not tested)  LOWER EXTREMITY ROM:       B hip flexion/ knee extension limited.    LOWER EXTREMITY MMT:       B LE muscle strength grossly 4/5 MMT except hamstring 4+/5 MMT and hip flexion 4-/5 MMT  Ascends steps with L LE and heavy UE assist.   LUMBAR SPECIAL TESTS:  Next tx. Session.  No supine positioning today.  FUNCTIONAL TESTS:  5 times sit to stand: 22.13 sec. With use of armrests.  Pt. Limited with STS from gray chair without UE assist.   6 minute walk test: TBD  GAIT: Distance walked: in clinic with elevated RW Assistive device utilized: Walker - 2 wheeled Level of assistance: Modified independence Comments: Significant forward head/ flexed posture/ thoracic kyphosis.  Slow, controlled gait pattern.  Fear of L knee buckling with R LE swing through phase of gait.    TODAY'S TREATMENT:                                                                                                                              DATE: 02/26/2023    Subjective:  Pt. Reports 6/10 R mid back pain in sitting and decreases with pt is moving. No change since the last session:   Pt. Had a difficult week due to dtr. Having a stent placed in bile duct.  Pt. Went toPt. Had a pulmonary function test results shows improvement. No change in medications.    hospital with dtr. And has not been to gym this week.  Pt. having a heart cath on 12/17 at [facility].  Significant B lower leg swelling and PT discussed the use of compression stockings.  Pt forgot her Upright walker a t home. So pt pt deferred from gait  training today.    Nustep L4 at seat position 11 for 10+ min. B UE/LE (consistent cadence)- MH to back.  Averaging 64 SPM.  O2 sat: 90%.  HR: 78 bpm. Lower Lower trunk rotation stretch 1 x 10 reps 5 secs hold [place]  2 x 30 secs each side DKC 1 x 10 reps 5 secs hold Pelvic tilts 10 reps Bridges 1 x10 reps Scapular retraction 1 x 10 reps Shoulder shrugs with posterior roll.  Pt educated to do the stretches 2 x a day. Pt agrees.   Not today: Seated B shoulder flexion/ abduction/ chest press with wand 20x.  Mirror feedback and moderate verbal/tactile cuing to prevent L lateral lean.  Tactile/verbal cuing for posture correction.    Standing marching/ hip abduction/ extension/ heel and toe raises 20x in //-bars (mirror feedback to correct head/upright posture).  O2 sat: 95%.       Sit to stands from gray chair in front of //-bars/ mirror 5x.    Seated LAQ/ marching 15x2.    Ambulate 58 feet from gym to outside of clinic with elevated RW and cuing for consistent recip. Gait pattern.  Pt. Challenged when stepping over door threshold/ rug at front of clinic.    No supine ex. Today  Reassessment of PT goals.    PATIENT EDUCATION:  Education details: Daily walking/ posture Person educated:  Patient Education method: Medical illustrator Education comprehension: verbalized understanding and returned demonstration  HOME EXERCISE PROGRAM: See handouts  ASSESSMENT:  CLINICAL IMPRESSION:    Patient presents today 10 mins late to the door of the clinic without her platform walker, ambulated into clinic/gym from front of building with significant forward flexed posture,and heavy forearm support of FWW.  Extra time required and PT provided SBA for gait the gym for safety as pt not accustomed to use FWW. PT continued to challenge tp with Strengthening, stretching and postural muscle activation exs.  Pt. Will benefit from skilled PT services in combination with gym based ex. To improve LE strengthening/ pain-free mobility.   OBJECTIVE IMPAIRMENTS: Abnormal gait, decreased activity tolerance, decreased balance, decreased endurance, decreased mobility, difficulty walking, decreased ROM, decreased strength, hypomobility, increased edema, impaired flexibility, improper body mechanics, postural dysfunction, and pain.   ACTIVITY LIMITATIONS: bending, sitting, standing, squatting, stairs, transfers, bed mobility, toileting, and locomotion level  PARTICIPATION LIMITATIONS: medication management  PERSONAL FACTORS: Fitness and Past/current experiences are also affecting patient's functional outcome.   REHAB POTENTIAL: Good  CLINICAL DECISION MAKING: Evolving/moderate complexity  EVALUATION COMPLEXITY: Moderate   GOALS: Goals reviewed with patient? Yes  SHORT TERM GOALS: Target date: 03/13/23  Pt. Will be independent with HEP to increase B LE muscle strength 1/2 muscle grade to improve transfer/ safety with walking.  Baseline:  see above Goal status: Not met  2.  Pt. Will complete with no rest breaks to improve walking endurance.   Baseline: TBD.  12/5: not completed at this time secondary to increase mid-back pain and inability to walk 6 minutes.    Goal status:  On-going   LONG TERM GOALS: Target date: 04/18/23  Pt. Will increase FOTO to 51 to improve pain-free functional mobility.  Baseline:  initial 40 Goal status: On-going  2.  Pt. Able to stand from 21# chair height with no UE assist or low back pain to improve transfers/ independence with daily activity.   Baseline: benefits from armrests/ UE assist.  Goal status: Not met  3.  Pt. Able to return to gym based ex. Program with no limitations or c/o low back pain.   Baseline: currently not exercising or going to gym.  12/5: pt. Returned to gym for a few weeks prior to family issues/ no recent gym visits. Goal status: Partially met   PLAN:   PT  FREQUENCY: 2x/week  PT DURATION: 8 weeks  PLANNED INTERVENTIONS: Therapeutic exercises, Therapeutic activity, Neuromuscular re-education, Balance training, Gait training, Patient/Family education, Self Care, Joint mobilization, DME instructions, Dry Needling, Electrical stimulation, Cryotherapy, Moist heat, and Manual therapy.  PLAN FOR NEXT SESSION: Standing there.ex./ posture correction with mirror feedback.       Janet [place] PT DPT 12:51 PM,02/26/23

## 2023-02-27 NOTE — Therapy (Unsigned)
OUTPATIENT PHYSICAL THERAPY THORACOLUMBAR TREATMENT/ RECERTIFICATION  Patient Name: Hannah Powell MRN: [id number] DOB:Jun 11, 1945, 77 y.o., female Today's Date: 02/27/2023  END OF SESSION:     Past Medical History:  Diagnosis Date   Anxiety    Arthritis    CHF (congestive heart failure) (HCC)    COPD (chronic obstructive pulmonary disease) (HCC)    Dysplastic nevus 04/22/2019   R abdomen parallel to sup umbilicus - mod   Dysplastic nevus 04/22/2019   R low back above waistline - mild    Edema    feet/legs   Hypertension    dr Darryll Capers      Hypothyroidism    Leg weakness, bilateral    Osteoporosis    Oxygen deficit    2l with bipap at night   RLS (restless legs syndrome)    Shortness of breath    Sleep apnea    bipap   Wheezing    Past Surgical History:  Procedure Laterality Date   BACK SURGERY     cervical   BREAST BIOPSY Left    needle bx-neg   CATARACT EXTRACTION W/PHACO Left 05/07/2017   Procedure: CATARACT EXTRACTION PHACO AND INTRAOCULAR LENS PLACEMENT (IOC);  Surgeon: Galen Manila, MD;  Location: [facility];  Service: Ophthalmology;  Laterality: Left;  [place] 00:48.7 AP% 11.3 CDE 5.46 Fluid Pack Lot # [id number]   CATARACT EXTRACTION W/PHACO Right 04/23/2017   Procedure: CATARACT EXTRACTION PHACO AND INTRAOCULAR LENS PLACEMENT (IOC);  Surgeon: Galen Manila, MD;  Location: [facility];  Service: Ophthalmology;  Laterality: Right;  [place] 00:29 AP% 12.8 CDE 3.79 FLUID PACK LOT # [id number]   CESAREAN SECTION     FOOT ARTHROPLASTY     JOINT REPLACEMENT     x2 tkr   TOTAL SHOULDER ARTHROPLASTY Left 07/02/2013   Procedure: LEFT TOTAL SHOULDER ARTHROPLASTY;  Surgeon: Senaida Lange, MD;  Location: [facility];  Service: Orthopedics;  Laterality: Left;   TOTAL SHOULDER ARTHROPLASTY Right 04/10/2018   Procedure: TOTAL REVERSE SHOULDER ARTHROPLASTY;  Surgeon: Francena Hanly, MD;  Location: [facility];  Service: Orthopedics;  Laterality: Right;    Patient Active  Problem List   Diagnosis Date Noted   Bilateral leg pain 08/29/2022   Cervical radiculopathy 08/28/2022   CHF exacerbation (HCC) 08/26/2022   Swelling of lower extremity 08/26/2022   Acute on chronic respiratory failure with hypoxia and hypercapnia (HCC) 03/29/2022   Chronic diastolic CHF (congestive heart failure) (HCC) 03/29/2022   Cellulitis of right lower extremity 03/29/2022   Restless leg syndrome 02/08/2022   Hypothyroidism 02/08/2022   Obesity (BMI 30-39.9) 02/08/2022   Insomnia 02/08/2022   Anxiety 02/08/2022   Poor appetite 02/08/2022   S/P reverse total shoulder arthroplasty, 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   Chronic diastolic heart failure (HCC) 08/09/2016   COPD (chronic obstructive pulmonary disease) (HCC) 08/09/2016   Pulmonary infiltrates    Pneumonia due to Streptococcus pneumoniae (HCC)    Fever    Community acquired pneumonia of right lung    Palliative care by specialist    Goals of care, counseling/discussion    Pressure injury of skin 07/03/2016   Acute respiratory failure with hypoxia (HCC) 07/02/2016   Acute on chronic diastolic CHF (congestive heart failure) (HCC) 07/21/2015   COPD exacerbation (HCC) 07/21/2015   Acute renal insufficiency 07/21/2015   Hyperglycemia 07/21/2015   HTN (hypertension) 07/21/2015   S/P shoulder replacement 07/02/2013    PCP: Lynnea Ferrier, MD  REFERRING PROVIDER: Lynnea Ferrier, MD  REFERRING DIAG: Lumbar DDD,  Neuropathy  Rationale for Evaluation and Treatment: Rehabilitation  THERAPY DIAG:  Difficulty in walking, not elsewhere classified  Muscle weakness (generalized)  Other low back pain  ONSET DATE: 03/20/2019  SUBJECTIVE:                                                                                                                                                                                           SUBJECTIVE STATEMENT: Pt. Reports chronic h/o lumbar stenosis/ back pain.  Pt. Reports 8/10 low back pain first  thing in the morning and uses heating pad/ ibuprofen to decrease back pain to 1/10 after about 2 hours.  Pt. Reports no recent falls and using elevated RW for short distance ambulation and w/c for community and some household mobility.  Pt. States she has poor standing/walking endurance and is very slow with movement, which is why she requires use of w/c.  Increase episodes of COPD since last PT tx. Session.  Pt. Has 2 weddings to attend on 10/26 and 11/16.    PERTINENT HISTORY:  See MD notes.    PAIN:  Are you having pain? Yes: NPRS scale: 8/10 Pain location: low back Pain description: aching Aggravating factors: lying supine/ getting out of bed.  Relieving factors: moist heat  PRECAUTIONS: Fall  RED FLAGS: None   WEIGHT BEARING RESTRICTIONS: No  FALLS:  Has patient fallen in last 6 months? No  LIVING ENVIRONMENT: Lives with: lives with their spouse Lives in: House/apartment Stairs: No Has following equipment at home: Environmental consultant - 2 wheeled, Wheelchair (manual), and Tour manager  OCCUPATION: Retired  PLOF: Requires assistive device for independence  PATIENT GOALS: Increase standing/walking endurance.  Decrease low back pain.  NEXT MD VISIT: PRN  OBJECTIVE:  Note: Objective measures were completed at Evaluation unless otherwise noted.  DIAGNOSTIC FINDINGS:  COMPARISON:  Lumbar spine MRI from 06/26/2020   FINDINGS: Normal alignment. The vertebral body heights are well preserved. No signs of acute fracture or subluxation. Moderate multilevel disc space narrowing and anterior and posterior spur formation. Facet arthropathy identified within the lower lumbar spine. Aortic atherosclerosis. Moderate stool burden noted within the colon.   IMPRESSION: 1. No acute findings. 2. Moderate multilevel degenerative disc disease and facet arthropathy.  PATIENT SURVEYS:  FOTO initial 40/ goal 6  SCREENING FOR RED FLAGS: Bowel or bladder incontinence: yes Spinal tumors:  No Cauda equina syndrome: No Compression fracture: No Abdominal aneurysm: No  COGNITION: Overall cognitive status: Within functional limits for tasks assessed   SENSATION: WFL, moderate lower leg swelling noted.  MUSCLE LENGTH: No supine positioning today Hamstrings: NT Thomas test: NT   POSTURE: rounded shoulders, forward  head, increased thoracic kyphosis, and flexed trunk   PALPATION: Tenderness over lumbar paraspinals (generalized).  LUMBAR ROM:   AROM eval  Flexion 50% limited  Extension 100% limited  Right lateral flexion 50% limited  Left lateral flexion 50% limited  Right rotation 50% limited  Left rotation 50% limited   (Blank rows = not tested)  LOWER EXTREMITY ROM:       B hip flexion/ knee extension limited.    LOWER EXTREMITY MMT:       B LE muscle strength grossly 4/5 MMT except hamstring 4+/5 MMT and hip flexion 4-/5 MMT  Ascends steps with L LE and heavy UE assist.   LUMBAR SPECIAL TESTS:  Next tx. Session.  No supine positioning today.  FUNCTIONAL TESTS:  5 times sit to stand: 22.13 sec. With use of armrests.  Pt. Limited with STS from gray chair without UE assist.   6 minute walk test: TBD  GAIT: Distance walked: in clinic with elevated RW Assistive device utilized: Hurley Blevins - 2 wheeled Level of assistance: Modified independence Comments: Significant forward head/ flexed posture/ thoracic kyphosis.  Slow, controlled gait pattern.  Fear of L knee buckling with R LE swing through phase of gait.   TODAY'S TREATMENT:                                                                                                                              DATE: 02/27/2023   *** Subjective:  Pt. Reports 6/10 R mid back pain in sitting and decreases with pt is moving. No change since the last session:   Pt. Had a difficult week due to dtr. Having a stent placed in bile duct.  Pt. Went toPt. Had a pulmonary function test results shows improvement. No change in  medications.    hospital with dtr. And has not been to gym this week.  Pt. having a heart cath on 12/17 at [facility].  Significant B lower leg swelling and PT discussed the use of compression stockings.  Pt forgot her [employer] a t home. So pt pt deferred from gait  training today.    Nustep L4 at seat position 11 for 10+ min. B UE/LE (consistent cadence)- MH to back.  Averaging 64 SPM.  O2 sat: 90%.  HR: 78 bpm. Lower Lower trunk rotation stretch 1 x 10 reps 5 secs hold SKC 2 x 30 secs each side DKC 1 x 10 reps 5 secs hold Pelvic tilts 10 reps Bridges 1 x10 reps Scapular retraction 1 x 10 reps Shoulder shrugs with posterior roll.  Pt educated to do the stretches 2 x a day. Pt agrees.   Not today: Seated B shoulder flexion/ abduction/ chest press with wand 20x.  Mirror feedback and moderate verbal/tactile cuing to prevent L lateral lean.  Tactile/verbal cuing for posture correction.    Standing marching/ hip abduction/ extension/ heel and toe raises 20x in //-bars (mirror feedback to correct head/upright posture).  O2  sat: 95%.       Sit to stands from gray chair in front of //-bars/ mirror 5x.    Seated LAQ/ marching 15x2.    Ambulate 58 feet from gym to outside of clinic with elevated RW and cuing for consistent recip. Gait pattern.  Pt. Challenged when stepping over door threshold/ rug at front of clinic.    No supine ex. Today  Reassessment of PT goals.    PATIENT EDUCATION:  Education details: Daily walking/ posture Person educated: Patient Education method: Medical illustrator Education comprehension: verbalized understanding and returned demonstration  HOME EXERCISE PROGRAM: See handouts  ASSESSMENT:  CLINICAL IMPRESSION: ***   Patient presents today 10 mins late to the door of the clinic without her platform Airica Schwartzkopf, ambulated into clinic/gym from front of building with significant forward flexed posture,and heavy forearm support of FWW.  Extra time  required and PT provided SBA for gait the gym for safety as pt not accustomed to use FWW. PT continued to challenge tp with Strengthening, stretching and postural muscle activation exs.  Pt. Will benefit from skilled PT services in combination with gym based ex. To improve LE strengthening/ pain-free mobility.   OBJECTIVE IMPAIRMENTS: Abnormal gait, decreased activity tolerance, decreased balance, decreased endurance, decreased mobility, difficulty walking, decreased ROM, decreased strength, hypomobility, increased edema, impaired flexibility, improper body mechanics, postural dysfunction, and pain.   ACTIVITY LIMITATIONS: bending, sitting, standing, squatting, stairs, transfers, bed mobility, toileting, and locomotion level  PARTICIPATION LIMITATIONS: medication management  PERSONAL FACTORS: Fitness and Past/current experiences are also affecting patient's functional outcome.   REHAB POTENTIAL: Good  CLINICAL DECISION MAKING: Evolving/moderate complexity  EVALUATION COMPLEXITY: Moderate   GOALS: Goals reviewed with patient? Yes  SHORT TERM GOALS: Target date: 03/13/23  Pt. Will be independent with HEP to increase B LE muscle strength 1/2 muscle grade to improve transfer/ safety with walking.  Baseline:  see above Goal status: Not met  2.  Pt. Will complete with no rest breaks to improve walking endurance.   Baseline: TBD.  12/5: not completed at this time secondary to increase mid-back pain and inability to walk 6 minutes.    Goal status: On-going   LONG TERM GOALS: Target date: 04/18/23  Pt. Will increase FOTO to 51 to improve pain-free functional mobility.  Baseline:  initial 40 Goal status: On-going  2.  Pt. Able to stand from 21# chair height with no UE assist or low back pain to improve transfers/ independence with daily activity.   Baseline: benefits from armrests/ UE assist.  Goal status: Not met  3.  Pt. Able to return to gym based ex. Program with no limitations  or c/o low back pain.   Baseline: currently not exercising or going to gym.  12/5: pt. Returned to gym for a few weeks prior to family issues/ no recent gym visits. Goal status: Partially met   PLAN:   PT FREQUENCY: 2x/week  PT DURATION: 8 weeks  PLANNED INTERVENTIONS: Therapeutic exercises, Therapeutic activity, Neuromuscular re-education, Balance training, Gait training, Patient/Family education, Self Care, Joint mobilization, DME instructions, Dry Needling, Electrical stimulation, Cryotherapy, Moist heat, and Manual therapy.  PLAN FOR NEXT SESSION: Standing there.ex./ posture correction with mirror feedback.       Maylon Peppers, PT, DPT Physical Therapist - [place]  [facility]  7:27 AM,02/27/23

## 2023-02-28 ENCOUNTER — Ambulatory Visit: Payer: Medicare HMO | Admitting: Physical Therapy

## 2023-03-05 ENCOUNTER — Encounter: Payer: Medicare HMO | Admitting: Physical Therapy

## 2023-03-05 DIAGNOSIS — N189 Chronic kidney disease, unspecified: Secondary | ICD-10-CM | POA: Diagnosis not present

## 2023-03-05 DIAGNOSIS — N1831 Chronic kidney disease, stage 3a: Secondary | ICD-10-CM | POA: Diagnosis not present

## 2023-03-05 DIAGNOSIS — E785 Hyperlipidemia, unspecified: Secondary | ICD-10-CM | POA: Diagnosis not present

## 2023-03-05 DIAGNOSIS — Z9989 Dependence on other enabling machines and devices: Secondary | ICD-10-CM | POA: Diagnosis not present

## 2023-03-05 DIAGNOSIS — J449 Chronic obstructive pulmonary disease, unspecified: Secondary | ICD-10-CM | POA: Diagnosis not present

## 2023-03-05 DIAGNOSIS — I132 Hypertensive heart and chronic kidney disease with heart failure and with stage 5 chronic kidney disease, or end stage renal disease: Secondary | ICD-10-CM | POA: Diagnosis not present

## 2023-03-05 DIAGNOSIS — I130 Hypertensive heart and chronic kidney disease with heart failure and stage 1 through stage 4 chronic kidney disease, or unspecified chronic kidney disease: Secondary | ICD-10-CM | POA: Diagnosis not present

## 2023-03-05 DIAGNOSIS — G4733 Obstructive sleep apnea (adult) (pediatric): Secondary | ICD-10-CM | POA: Diagnosis not present

## 2023-03-05 DIAGNOSIS — E039 Hypothyroidism, unspecified: Secondary | ICD-10-CM | POA: Diagnosis not present

## 2023-03-05 DIAGNOSIS — I5032 Chronic diastolic (congestive) heart failure: Secondary | ICD-10-CM | POA: Diagnosis not present

## 2023-03-07 ENCOUNTER — Encounter: Payer: Medicare HMO | Admitting: Physical Therapy

## 2023-03-11 ENCOUNTER — Encounter: Payer: Medicare HMO | Admitting: Physical Therapy

## 2023-03-14 ENCOUNTER — Encounter: Payer: Medicare HMO | Admitting: Physical Therapy

## 2023-03-19 ENCOUNTER — Encounter: Payer: Medicare HMO | Admitting: Physical Therapy

## 2023-04-01 ENCOUNTER — Other Ambulatory Visit: Payer: Self-pay | Admitting: Obstetrics and Gynecology

## 2023-04-01 DIAGNOSIS — N393 Stress incontinence (female) (male): Secondary | ICD-10-CM

## 2023-04-01 MED ORDER — DIAZEPAM 5 MG PO TABS
ORAL_TABLET | ORAL | 0 refills | Status: AC
Start: 2023-04-01 — End: ?

## 2023-04-01 NOTE — Progress Notes (Signed)
 [employer] contacted at [phone] #6 to check is pre-certification is needed for Urethral Bulking. NO PA REQUIRED   Ref# [id number] (ID AND Date)

## 2023-04-01 NOTE — Progress Notes (Signed)
 Pt requested something for anxiety for procedure. Will prescribe valium 5mg . Pt will need a driver.

## 2023-04-04 ENCOUNTER — Encounter: Payer: Self-pay | Admitting: Obstetrics and Gynecology

## 2023-04-04 ENCOUNTER — Ambulatory Visit: Payer: Medicare Other | Admitting: Obstetrics and Gynecology

## 2023-04-04 VITALS — BP 106/71 | HR 97

## 2023-04-04 DIAGNOSIS — N393 Stress incontinence (female) (male): Secondary | ICD-10-CM

## 2023-04-04 DIAGNOSIS — R350 Frequency of micturition: Secondary | ICD-10-CM

## 2023-04-04 LAB — POCT URINALYSIS DIPSTICK
Bilirubin, UA: NEGATIVE
Blood, UA: NEGATIVE
Glucose, UA: NEGATIVE
Ketones, UA: NEGATIVE
Leukocytes, UA: NEGATIVE
Nitrite, UA: NEGATIVE
Protein, UA: NEGATIVE
Spec Grav, UA: 1.025 (ref 1.010–1.025)
Urobilinogen, UA: 0.2 U/dL
pH, UA: 5.5 (ref 5.0–8.0)

## 2023-04-04 MED ORDER — LIDOCAINE HCL URETHRAL/MUCOSAL 2 % EX GEL
1.0000 | Freq: Once | CUTANEOUS | Status: AC
Start: 2023-04-04 — End: 2023-04-04
  Administered 2023-04-04: 1 via URETHRAL

## 2023-04-04 MED ORDER — LIDOCAINE-EPINEPHRINE 1 %-1:100000 IJ SOLN
6.0000 mL | Freq: Once | INTRAMUSCULAR | Status: AC
Start: 2023-04-04 — End: 2023-04-04
  Administered 2023-04-04: 6 mL

## 2023-04-04 NOTE — Patient Instructions (Signed)
Taking Care of Yourself after Urodynamics, Cystoscopy, Bulkamid Injection, or Botox Injection   Drink plenty of water for a day or two following your procedure. Try to have about 8 ounces (one cup) at a time, and do this 6 times or more per day unless you have fluid restrictitons AVOID irritative beverages such as coffee, tea, soda, alcoholic or citrus drinks for a day or two, as this may cause burning with urination.  For the first 1-2 days after the procedure, your urine may be pink or red in color. You may have some blood in your urine as a normal side effect of the procedure. Large amounts of bleeding or difficulty urinating are NOT normal. Call the nurse line if this happens or go to the nearest Emergency Room if the bleeding is heavy or you cannot urinate at all and it is after hours. If you had a Bulkamid injection in the urethra and need to be catheterized, ask for a pediatric catheter to be used (size 10 or 12-French) so the material is not pushed out of place.   You may experience some discomfort or a burning sensation with urination after having this procedure. You can use over the counter Azo or pyridium to help with burning and follow the instructions on the packaging. If it does not improve within 1-2 days, or other symptoms appear (fever, chills, or difficulty urinating) call the office to speak to a nurse.  You may return to normal daily activities such as work, school, driving, exercising and housework on the day of the procedure. If your doctor gave you a prescription, take it as ordered.     

## 2023-04-04 NOTE — Progress Notes (Signed)
Bulkamid Injection  CC: 78 y.o. 78 y.o. F with stress incontinence who presents for transurethral Bulkamid injection.  Patient signed her consent form.  She started antibiotic prophylaxis today.  Today's Vitals   04/04/23 0933  BP: 106/71  Pulse: 97    Results for orders placed or performed in visit on 04/04/23 (from the past 24 hours)  POCT Urinalysis Dipstick     Status: None   Collection Time: 04/04/23 12:14 PM  Result Value Ref Range   Color, UA Yellow    Clarity, UA Clear    Glucose, UA Negative Negative   Bilirubin, UA Negative    Ketones, UA Negative    Spec Grav, UA 1.025 1.010 - 1.025   Blood, UA Negative    pH, UA 5.5 5.0 - 8.0   Protein, UA Negative Negative   Urobilinogen, UA 0.2 0.2 or 1.0 E.U./dL   Nitrite, UA Negative    Leukocytes, UA Negative Negative   Appearance     Odor      Procedure: Time out was performed. The bladder was catheterized and 10 ml of 2% lidocaine jelly placed in the urethra. A urethral block was performed by injecting 3ml of 1% lidocaine with epinephrine at 3 and 9 o'clock adjacent to the urethra.  The needle was primed.  The cystoscope was inserted to the level of the bladder neck.  The needle was inserted 2 cm and the scope was pulled back into the urethra 2 cm.  The needle was inserted bevel up at the 5 o'clock position and the Bulkamid was injected to obtain coaptation.  This was repeated at the 2 o'clock,  10 o'clock and 7 o'clock positions.   A total of 2- 1ml syringes were used and good circumferential coaptation was noted.  The patient tolerated the procedure well. She was asked to void after the procedure.  Post-Void Residual (PVR) by Bladder Scan: In order to evaluate bladder emptying, we discussed obtaining a postvoid residual and she agreed to this procedure.  Procedure: The ultrasound unit was placed on the patient's abdomen in the suprapubic region after the patient had voided. A PVR of 0 ml was obtained by bladder  scan.    ASSESSMENT: 78 y.o. y.o. s/p transurethral Bulkamid injection for stress incontinence.   PLAN: Patient will follow up in 4 weeks to reassess. Voiding and post-procedure precautions were given. She will return for heavy bleeding, fevers, dysuria lasting beyond today and incomplete emptying.  All questions were answered.  Marguerita Beards, MD

## 2023-04-09 ENCOUNTER — Encounter: Payer: Self-pay | Admitting: Physical Therapy

## 2023-04-09 ENCOUNTER — Ambulatory Visit: Payer: Medicare Other | Attending: Internal Medicine | Admitting: Physical Therapy

## 2023-04-09 DIAGNOSIS — R269 Unspecified abnormalities of gait and mobility: Secondary | ICD-10-CM | POA: Insufficient documentation

## 2023-04-09 DIAGNOSIS — R293 Abnormal posture: Secondary | ICD-10-CM | POA: Insufficient documentation

## 2023-04-09 DIAGNOSIS — R2681 Unsteadiness on feet: Secondary | ICD-10-CM | POA: Insufficient documentation

## 2023-04-09 DIAGNOSIS — M5459 Other low back pain: Secondary | ICD-10-CM | POA: Insufficient documentation

## 2023-04-09 DIAGNOSIS — R2689 Other abnormalities of gait and mobility: Secondary | ICD-10-CM | POA: Insufficient documentation

## 2023-04-09 DIAGNOSIS — R262 Difficulty in walking, not elsewhere classified: Secondary | ICD-10-CM | POA: Insufficient documentation

## 2023-04-09 DIAGNOSIS — M6281 Muscle weakness (generalized): Secondary | ICD-10-CM | POA: Insufficient documentation

## 2023-04-09 NOTE — Therapy (Signed)
OUTPATIENT PHYSICAL THERAPY THORACOLUMBAR TREATMENT  Patient Name: Hannah Powell MRN: [id number] DOB:01/20/1946, 78 y.o., female Today's Date: 04/09/2023  END OF SESSION:  PT End of Session - 04/09/23 0807     Visit Number 16    Number of Visits 30    Date for PT Re-Evaluation 04/18/23    PT Start Time 0808    PT Stop Time 0901    PT Time Calculation (min) 53 min    Equipment Utilized During Treatment Gait belt    Activity Tolerance Patient tolerated treatment well    Behavior During Therapy WFL for tasks assessed/performed            Past Medical History:  Diagnosis Date   Anxiety    Arthritis    CHF (congestive heart failure) (HCC)    COPD (chronic obstructive pulmonary disease) (HCC)    Dysplastic nevus 04/22/2019   R abdomen parallel to sup umbilicus - mod   Dysplastic nevus 04/22/2019   R low back above waistline - mild    Edema    feet/legs   Hypertension    dr Darryll Capers      Hypothyroidism    Leg weakness, bilateral    Osteoporosis    Oxygen deficit    2l with bipap at night   RLS (restless legs syndrome)    Shortness of breath    Sleep apnea    bipap   Wheezing    Past Surgical History:  Procedure Laterality Date   BACK SURGERY     cervical   BREAST BIOPSY Left    needle bx-neg   CATARACT EXTRACTION W/PHACO Left 05/07/2017   Procedure: CATARACT EXTRACTION PHACO AND INTRAOCULAR LENS PLACEMENT (IOC);  Surgeon: Galen Manila, MD;  Location: [facility];  Service: Ophthalmology;  Laterality: Left;  [place] 00:48.7 AP% 11.3 CDE 5.46 Fluid Pack Lot # [id number]   CATARACT EXTRACTION W/PHACO Right 04/23/2017   Procedure: CATARACT EXTRACTION PHACO AND INTRAOCULAR LENS PLACEMENT (IOC);  Surgeon: Galen Manila, MD;  Location: [facility];  Service: Ophthalmology;  Laterality: Right;  [place] 00:29 AP% 12.8 CDE 3.79 FLUID PACK LOT # [id number]   CESAREAN SECTION     FOOT ARTHROPLASTY     JOINT REPLACEMENT     x2 tkr   TOTAL SHOULDER ARTHROPLASTY Left  07/02/2013   Procedure: LEFT TOTAL SHOULDER ARTHROPLASTY;  Surgeon: Senaida Lange, MD;  Location: [facility];  Service: Orthopedics;  Laterality: Left;   TOTAL SHOULDER ARTHROPLASTY Right 04/10/2018   Procedure: TOTAL REVERSE SHOULDER ARTHROPLASTY;  Surgeon: Francena Hanly, MD;  Location: [facility];  Service: Orthopedics;  Laterality: Right;    Patient Active Problem List   Diagnosis Date Noted   Bilateral leg pain 08/29/2022   Cervical radiculopathy 08/28/2022   CHF exacerbation (HCC) 08/26/2022   Swelling of lower extremity 08/26/2022   Acute on chronic respiratory failure with hypoxia and hypercapnia (HCC) 03/29/2022   Chronic diastolic CHF (congestive heart failure) (HCC) 03/29/2022   Cellulitis of right lower extremity 03/29/2022   Restless leg syndrome 02/08/2022   Hypothyroidism 02/08/2022   Obesity (BMI 30-39.9) 02/08/2022   Insomnia 02/08/2022   Anxiety 02/08/2022   Poor appetite 02/08/2022   S/P reverse total shoulder arthroplasty, 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   Chronic diastolic heart failure (HCC) 08/09/2016   COPD (chronic obstructive pulmonary disease) (HCC) 08/09/2016   Pulmonary infiltrates    Pneumonia due to Streptococcus pneumoniae (HCC)    Fever    Community acquired pneumonia of right lung    Palliative care  by specialist    Goals of care, counseling/discussion    Pressure injury of skin 07/03/2016   Acute respiratory failure with hypoxia (HCC) 07/02/2016   Acute on chronic diastolic CHF (congestive heart failure) (HCC) 07/21/2015   COPD exacerbation (HCC) 07/21/2015   Acute renal insufficiency 07/21/2015   Hyperglycemia 07/21/2015   HTN (hypertension) 07/21/2015   S/P shoulder replacement 07/02/2013    PCP: Lynnea Ferrier, MD  REFERRING PROVIDER: Lynnea Ferrier, MD  REFERRING DIAG: Lumbar DDD, Neuropathy  Rationale for Evaluation and Treatment: Rehabilitation  THERAPY DIAG:  Difficulty in walking, not elsewhere classified  Muscle weakness  (generalized)  Other low back pain  Imbalance  Unsteadiness on feet  Gait difficulty  Abnormal posture  ONSET DATE: 03/20/2019  SUBJECTIVE:                                                                                                                                                                                           SUBJECTIVE STATEMENT: Pt. Reports chronic h/o lumbar stenosis/ back pain.  Pt. Reports 8/10 low back pain first thing in the morning and uses heating pad/ ibuprofen to decrease back pain to 1/10 after about 2 hours.  Pt. Reports no recent falls and using elevated RW for short distance ambulation and w/c for community and some household mobility.  Pt. States she has poor standing/walking endurance and is very slow with movement, which is why she requires use of w/c.  Increase episodes of COPD since last PT tx. Session.  Pt. Has 2 weddings to attend on 10/26 and 11/16.    PERTINENT HISTORY:  See MD notes.    PAIN:  Are you having pain? Yes: NPRS scale: 8/10 Pain location: low back Pain description: aching Aggravating factors: lying supine/ getting out of bed.  Relieving factors: moist heat  PRECAUTIONS: Fall  RED FLAGS: None   WEIGHT BEARING RESTRICTIONS: No  FALLS:  Has patient fallen in last 6 months? No  LIVING ENVIRONMENT: Lives with: lives with their spouse Lives in: House/apartment Stairs: No Has following equipment at home: Environmental consultant - 2 wheeled, Wheelchair (manual), and Tour manager  OCCUPATION: Retired  PLOF: Requires assistive device for independence  PATIENT GOALS: Increase standing/walking endurance.  Decrease low back pain.  NEXT MD VISIT: PRN  OBJECTIVE:  Note: Objective measures were completed at Evaluation unless otherwise noted.  DIAGNOSTIC FINDINGS:  COMPARISON:  Lumbar spine MRI from 06/26/2020   FINDINGS: Normal alignment. The vertebral body heights are well preserved. No signs of acute fracture or subluxation. Moderate  multilevel disc space narrowing and anterior and posterior spur formation. Facet arthropathy identified within  the lower lumbar spine. Aortic atherosclerosis. Moderate stool burden noted within the colon.   IMPRESSION: 1. No acute findings. 2. Moderate multilevel degenerative disc disease and facet arthropathy.  PATIENT SURVEYS:  FOTO initial 40/ goal 66  SCREENING FOR RED FLAGS: Bowel or bladder incontinence: yes Spinal tumors: No Cauda equina syndrome: No Compression fracture: No Abdominal aneurysm: No  COGNITION: Overall cognitive status: Within functional limits for tasks assessed   SENSATION: WFL, moderate lower leg swelling noted.  MUSCLE LENGTH: No supine positioning today Hamstrings: NT Thomas test: NT   POSTURE: rounded shoulders, forward head, increased thoracic kyphosis, and flexed trunk   PALPATION: Tenderness over lumbar paraspinals (generalized).  LUMBAR ROM:   AROM eval  Flexion 50% limited  Extension 100% limited  Right lateral flexion 50% limited  Left lateral flexion 50% limited  Right rotation 50% limited  Left rotation 50% limited   (Blank rows = not tested)  LOWER EXTREMITY ROM:       B hip flexion/ knee extension limited.    LOWER EXTREMITY MMT:       B LE muscle strength grossly 4/5 MMT except hamstring 4+/5 MMT and hip flexion 4-/5 MMT  Ascends steps with L LE and heavy UE assist.   LUMBAR SPECIAL TESTS:  Next tx. Session.  No supine positioning today.  FUNCTIONAL TESTS:  5 times sit to stand: 22.13 sec. With use of armrests.  Pt. Limited with STS from gray chair without UE assist.   6 minute walk test: TBD  GAIT: Distance walked: in clinic with elevated RW Assistive device utilized: Walker - 2 wheeled Level of assistance: Modified independence Comments: Significant forward head/ flexed posture/ thoracic kyphosis.  Slow, controlled gait pattern.  Fear of L knee buckling with R LE swing through phase of gait.   TODAY'S  TREATMENT:                                                                                                                              DATE: 04/09/2023    Subjective:  Pt. Reports 3/10 central low back pain in sitting and pain decreases when pt is moving.  Pt. Has not been seen by PT over past month secondary to pt. Out of town/ in [place] with son.  Pt. Has been to the gym a few times since 1st of year.  Pt. Went to gym yesterday and did bike/ core machine.       There.ex.:  Nustep L4 at seat position 11 for 10+ min. B UE/LE (consistent cadence)- MH to back.  Averaging 64 SPM.  O2 sat: 90%.  HR: 78 bpm.  Seated posture correction/ B shoulder flexion with cuing from PT to prevent lateral leaning.    Walking in //-bars with cuing to increase cadence/ heel strike/ recip. Step pattern/ upright posture.    Standing marching/ hip abduction 20x in //-bars (mirror feedback to correct head/upright posture).  No c/o back pain.  Sit to stands from gray chair in front of //-bars 5x.    Seated LAQ/ marching 15x2.   Discussed gym based ex.  Seated Nautilus: 40# lat. Pull downs 20x2 with cuing for proper head position/ posture.     Ambulate 58 feet from gym to outside of clinic with elevated RW and cuing for consistent recip. Gait pattern.  Pt. Challenged when stepping over door threshold/ rug at front of clinic.    No supine ex. Today   PATIENT EDUCATION:  Education details: Daily walking/ posture Person educated: Patient Education method: Medical illustrator Education comprehension: verbalized understanding and returned demonstration  HOME EXERCISE PROGRAM: See handouts  ASSESSMENT:  CLINICAL IMPRESSION:    Patient presents today 10 mins late to the door of the clinic without her platform walker, ambulated into clinic/gym from front of building with significant forward flexed posture,and heavy forearm support of FWW.  Extra time required and PT provided SBA for gait the gym  for safety as pt not accustomed to use FWW. PT continued to challenge tp with Strengthening, stretching and postural muscle activation exs.  Pt. Will benefit from skilled PT services in combination with gym based ex. To improve LE strengthening/ pain-free mobility.   OBJECTIVE IMPAIRMENTS: Abnormal gait, decreased activity tolerance, decreased balance, decreased endurance, decreased mobility, difficulty walking, decreased ROM, decreased strength, hypomobility, increased edema, impaired flexibility, improper body mechanics, postural dysfunction, and pain.   ACTIVITY LIMITATIONS: bending, sitting, standing, squatting, stairs, transfers, bed mobility, toileting, and locomotion level  PARTICIPATION LIMITATIONS: medication management  PERSONAL FACTORS: Fitness and Past/current experiences are also affecting patient's functional outcome.   REHAB POTENTIAL: Good  CLINICAL DECISION MAKING: Evolving/moderate complexity  EVALUATION COMPLEXITY: Moderate   GOALS: Goals reviewed with patient? Yes  SHORT TERM GOALS: Target date: 03/13/23  Pt. Will be independent with HEP to increase B LE muscle strength 1/2 muscle grade to improve transfer/ safety with walking.  Baseline:  see above Goal status: Not met  2.  Pt. Will complete with no rest breaks to improve walking endurance.   Baseline: TBD.  12/5: not completed at this time secondary to increase mid-back pain and inability to walk 6 minutes.    Goal status: On-going   LONG TERM GOALS: Target date: 04/18/23  Pt. Will increase FOTO to 51 to improve pain-free functional mobility.  Baseline:  initial 40 Goal status: On-going  2.  Pt. Able to stand from 21# chair height with no UE assist or low back pain to improve transfers/ independence with daily activity.   Baseline: benefits from armrests/ UE assist.  Goal status: Not met  3.  Pt. Able to return to gym based ex. Program with no limitations or c/o low back pain.   Baseline: currently  not exercising or going to gym.  12/5: pt. Returned to gym for a few weeks prior to family issues/ no recent gym visits. Goal status: Partially met   PLAN:   PT FREQUENCY: 2x/week  PT DURATION: 8 weeks  PLANNED INTERVENTIONS: Therapeutic exercises, Therapeutic activity, Neuromuscular re-education, Balance training, Gait training, Patient/Family education, Self Care, Joint mobilization, DME instructions, Dry Needling, Electrical stimulation, Cryotherapy, Moist heat, and Manual therapy.  PLAN FOR NEXT SESSION: Standing there.ex./ posture correction with mirror feedback.   CHECK GOALS/ SCHEDULE.     Cammie Mcgee, PT, DPT # [phone] 3:00 PM,04/09/23

## 2023-04-11 ENCOUNTER — Encounter: Payer: Self-pay | Admitting: Physical Therapy

## 2023-04-11 ENCOUNTER — Ambulatory Visit: Payer: Medicare Other | Admitting: Physical Therapy

## 2023-04-11 DIAGNOSIS — R2689 Other abnormalities of gait and mobility: Secondary | ICD-10-CM

## 2023-04-11 DIAGNOSIS — R262 Difficulty in walking, not elsewhere classified: Secondary | ICD-10-CM

## 2023-04-11 DIAGNOSIS — M6281 Muscle weakness (generalized): Secondary | ICD-10-CM

## 2023-04-11 DIAGNOSIS — M5459 Other low back pain: Secondary | ICD-10-CM

## 2023-04-11 NOTE — Therapy (Signed)
OUTPATIENT PHYSICAL THERAPY THORACOLUMBAR TREATMENT  Patient Name: Hannah Powell MRN: [id number] DOB:[birth year], 78 y.o., female Today's Date: 04/12/2023  END OF SESSION:  PT End of Session - 04/11/23 1438     Visit Number 17    Number of Visits 30    Date for PT Re-Evaluation 04/18/23    PT Start Time 1428    PT Stop Time 1520    PT Time Calculation (min) 52 min    Equipment Utilized During Treatment Gait belt    Activity Tolerance Patient tolerated treatment well    Behavior During Therapy WFL for tasks assessed/performed            Past Medical History:  Diagnosis Date   Anxiety    Arthritis    CHF (congestive heart failure) (HCC)    COPD (chronic obstructive pulmonary disease) (HCC)    Dysplastic nevus 04/22/2019   R abdomen parallel to sup umbilicus - mod   Dysplastic nevus 04/22/2019   R low back above waistline - mild    Edema    feet/legs   Hypertension    dr Darryll Capers      Hypothyroidism    Leg weakness, bilateral    Osteoporosis    Oxygen deficit    2l with bipap at night   RLS (restless legs syndrome)    Shortness of breath    Sleep apnea    bipap   Wheezing    Past Surgical History:  Procedure Laterality Date   BACK SURGERY     cervical   BREAST BIOPSY Left    needle bx-neg   CATARACT EXTRACTION W/PHACO Left 05/07/2017   Procedure: CATARACT EXTRACTION PHACO AND INTRAOCULAR LENS PLACEMENT (IOC);  Surgeon: Galen Manila, MD;  Location: [facility];  Service: Ophthalmology;  Laterality: Left;  [place] 00:48.7 AP% 11.3 CDE 5.46 Fluid Pack Lot # [id number]   CATARACT EXTRACTION W/PHACO Right 04/23/2017   Procedure: CATARACT EXTRACTION PHACO AND INTRAOCULAR LENS PLACEMENT (IOC);  Surgeon: Galen Manila, MD;  Location: [facility];  Service: Ophthalmology;  Laterality: Right;  [place] 00:29 AP% 12.8 CDE 3.79 FLUID PACK LOT # [id number]   CESAREAN SECTION     FOOT ARTHROPLASTY     JOINT REPLACEMENT     x2 tkr   TOTAL SHOULDER ARTHROPLASTY Left  07/02/2013   Procedure: LEFT TOTAL SHOULDER ARTHROPLASTY;  Surgeon: Senaida Lange, MD;  Location: [facility];  Service: Orthopedics;  Laterality: Left;   TOTAL SHOULDER ARTHROPLASTY Right 04/10/2018   Procedure: TOTAL REVERSE SHOULDER ARTHROPLASTY;  Surgeon: Francena Hanly, MD;  Location: [facility];  Service: Orthopedics;  Laterality: Right;    Patient Active Problem List   Diagnosis Date Noted   Bilateral leg pain 08/29/2022   Cervical radiculopathy 08/28/2022   CHF exacerbation (HCC) 08/26/2022   Swelling of lower extremity 08/26/2022   Acute on chronic respiratory failure with hypoxia and hypercapnia (HCC) 03/29/2022   Chronic diastolic CHF (congestive heart failure) (HCC) 03/29/2022   Cellulitis of right lower extremity 03/29/2022   Restless leg syndrome 02/08/2022   Hypothyroidism 02/08/2022   Obesity (BMI 30-39.9) 02/08/2022   Insomnia 02/08/2022   Anxiety 02/08/2022   Poor appetite 02/08/2022   S/P reverse total shoulder arthroplasty, 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   Chronic diastolic heart failure (HCC) 08/09/2016   COPD (chronic obstructive pulmonary disease) (HCC) 08/09/2016   Pulmonary infiltrates    Pneumonia due to Streptococcus pneumoniae (HCC)    Fever    Community acquired pneumonia of right lung    Palliative care  by specialist    Goals of care, counseling/discussion    Pressure injury of skin 07/03/2016   Acute respiratory failure with hypoxia (HCC) 07/02/2016   Acute on chronic diastolic CHF (congestive heart failure) (HCC) 07/21/2015   COPD exacerbation (HCC) 07/21/2015   Acute renal insufficiency 07/21/2015   Hyperglycemia 07/21/2015   HTN (hypertension) 07/21/2015   S/P shoulder replacement 07/02/2013    PCP: Lynnea Ferrier, MD  REFERRING PROVIDER: Lynnea Ferrier, MD  REFERRING DIAG: Lumbar DDD, Neuropathy  Rationale for Evaluation and Treatment: Rehabilitation  THERAPY DIAG:  Difficulty in walking, not elsewhere classified  Muscle weakness  (generalized)  Other low back pain  Imbalance  ONSET DATE: 03/20/2019  SUBJECTIVE:                                                                                                                                                                                           SUBJECTIVE STATEMENT: Pt. Reports chronic h/o lumbar stenosis/ back pain.  Pt. Reports 8/10 low back pain first thing in the morning and uses heating pad/ ibuprofen to decrease back pain to 1/10 after about 2 hours.  Pt. Reports no recent falls and using elevated RW for short distance ambulation and w/c for community and some household mobility.  Pt. States she has poor standing/walking endurance and is very slow with movement, which is why she requires use of w/c.  Increase episodes of COPD since last PT tx. Session.  Pt. Has 2 weddings to attend on 10/26 and 11/16.    PERTINENT HISTORY:  See MD notes.    PAIN:  Are you having pain? Yes: NPRS scale: 8/10 Pain location: low back Pain description: aching Aggravating factors: lying supine/ getting out of bed.  Relieving factors: moist heat  PRECAUTIONS: Fall  RED FLAGS: None   WEIGHT BEARING RESTRICTIONS: No  FALLS:  Has patient fallen in last 6 months? No  LIVING ENVIRONMENT: Lives with: lives with their spouse Lives in: House/apartment Stairs: No Has following equipment at home: Environmental consultant - 2 wheeled, Wheelchair (manual), and Tour manager  OCCUPATION: Retired  PLOF: Requires assistive device for independence  PATIENT GOALS: Increase standing/walking endurance.  Decrease low back pain.  NEXT MD VISIT: PRN  OBJECTIVE:  Note: Objective measures were completed at Evaluation unless otherwise noted.  DIAGNOSTIC FINDINGS:  COMPARISON:  Lumbar spine MRI from 06/26/2020   FINDINGS: Normal alignment. The vertebral body heights are well preserved. No signs of acute fracture or subluxation. Moderate multilevel disc space narrowing and anterior and posterior spur  formation. Facet arthropathy identified within the lower lumbar spine. Aortic atherosclerosis. Moderate stool burden noted  within the colon.   IMPRESSION: 1. No acute findings. 2. Moderate multilevel degenerative disc disease and facet arthropathy.  PATIENT SURVEYS:  FOTO initial 40/ goal 25  SCREENING FOR RED FLAGS: Bowel or bladder incontinence: yes Spinal tumors: No Cauda equina syndrome: No Compression fracture: No Abdominal aneurysm: No  COGNITION: Overall cognitive status: Within functional limits for tasks assessed   SENSATION: WFL, moderate lower leg swelling noted.  MUSCLE LENGTH: No supine positioning today Hamstrings: NT Thomas test: NT   POSTURE: rounded shoulders, forward head, increased thoracic kyphosis, and flexed trunk   PALPATION: Tenderness over lumbar paraspinals (generalized).  LUMBAR ROM:   AROM eval  Flexion 50% limited  Extension 100% limited  Right lateral flexion 50% limited  Left lateral flexion 50% limited  Right rotation 50% limited  Left rotation 50% limited   (Blank rows = not tested)  LOWER EXTREMITY ROM:       B hip flexion/ knee extension limited.    LOWER EXTREMITY MMT:       B LE muscle strength grossly 4/5 MMT except hamstring 4+/5 MMT and hip flexion 4-/5 MMT  Ascends steps with L LE and heavy UE assist.   LUMBAR SPECIAL TESTS:  Next tx. Session.  No supine positioning today.  FUNCTIONAL TESTS:  5 times sit to stand: 22.13 sec. With use of armrests.  Pt. Limited with STS from gray chair without UE assist.   6 minute walk test: TBD  GAIT: Distance walked: in clinic with elevated RW Assistive device utilized: Walker - 2 wheeled Level of assistance: Modified independence Comments: Significant forward head/ flexed posture/ thoracic kyphosis.  Slow, controlled gait pattern.  Fear of L knee buckling with R LE swing through phase of gait.   TODAY'S TREATMENT:                                                                                                                               DATE: 04/12/2023    Subjective:  Pt. Reports no central low back pain while walking into PT clinic with use of RW.  Pt. Did not go to gym yesterday due to weather.  Pt. Motivated to participate with PT today and focus on generalized strengthening/ posture correction.    There.ex.:  Seated posture correction (MH to low back)/ B shoulder flexion/ chest press with wand (cuing to prevent lateral leaning).    Walking in //-bars with cuing to increase cadence/ heel strike/ recip. Step pattern/ upright posture.    Standing marching/ hip abduction 20x in //-bars (mirror feedback to correct head/upright posture).  No c/o back pain.  Nustep L4 at seat position 11 for 10+ min. B UE/LE (consistent cadence)- MH to back.  Averaging 64 SPM.  O2 sat: 90%.  HR: 78 bpm.  Sit to stands from gray chair in front of //-bars 5x.    Seated LAQ/ marching 15x2.   Discussed gym based ex.  Seated Nautilus: 40# lat. Pull downs  20x2 with cuing for proper head position/ posture.     Ambulate 74 feet from gym to outside of clinic with elevated RW and cuing for consistent recip. Gait pattern.  Pt. Challenged when stepping over door threshold/ rug at front of clinic.    No supine ex. Today   PATIENT EDUCATION:  Education details: Daily walking/ posture Person educated: Patient Education method: Medical illustrator Education comprehension: verbalized understanding and returned demonstration  HOME EXERCISE PROGRAM: See handouts  ASSESSMENT:  CLINICAL IMPRESSION:    Pt. Requires extra time to ambulate into clinic/ gym and PT provided SBA with gait to gym for safety.   PT continued to challenge pt. with Strengthening, stretching and postural muscle activation exs. No c/o back pain today and pt. Motivated to increase upright posture/ generalized strengthening and endurance.  Pt. Will benefit from skilled PT services in combination with gym  based ex. To improve LE strengthening/ pain-free mobility.   OBJECTIVE IMPAIRMENTS: Abnormal gait, decreased activity tolerance, decreased balance, decreased endurance, decreased mobility, difficulty walking, decreased ROM, decreased strength, hypomobility, increased edema, impaired flexibility, improper body mechanics, postural dysfunction, and pain.   ACTIVITY LIMITATIONS: bending, sitting, standing, squatting, stairs, transfers, bed mobility, toileting, and locomotion level  PARTICIPATION LIMITATIONS: medication management  PERSONAL FACTORS: Fitness and Past/current experiences are also affecting patient's functional outcome.   REHAB POTENTIAL: Good  CLINICAL DECISION MAKING: Evolving/moderate complexity  EVALUATION COMPLEXITY: Moderate   GOALS: Goals reviewed with patient? Yes  SHORT TERM GOALS: Target date: 03/13/23  Pt. Will be independent with HEP to increase B LE muscle strength 1/2 muscle grade to improve transfer/ safety with walking.  Baseline:  see above Goal status: Not met  2.  Pt. Will complete with no rest breaks to improve walking endurance.   Baseline: TBD.  12/5: not completed at this time secondary to increase mid-back pain and inability to walk 6 minutes.    Goal status: On-going   LONG TERM GOALS: Target date: 04/18/23  Pt. Will increase FOTO to 51 to improve pain-free functional mobility.  Baseline:  initial 40 Goal status: On-going  2.  Pt. Able to stand from 21# chair height with no UE assist or low back pain to improve transfers/ independence with daily activity.   Baseline: benefits from armrests/ UE assist.  Goal status: Not met  3.  Pt. Able to return to gym based ex. Program with no limitations or c/o low back pain.   Baseline: currently not exercising or going to gym.  12/5: pt. Returned to gym for a few weeks prior to family issues/ no recent gym visits. Goal status: Partially met   PLAN:   PT FREQUENCY: 2x/week  PT DURATION: 8  weeks  PLANNED INTERVENTIONS: Therapeutic exercises, Therapeutic activity, Neuromuscular re-education, Balance training, Gait training, Patient/Family education, Self Care, Joint mobilization, DME instructions, Dry Needling, Electrical stimulation, Cryotherapy, Moist heat, and Manual therapy.  PLAN FOR NEXT SESSION: Standing there.ex./ posture correction with mirror feedback.   CHECK GOALS/ SCHEDULE.     Cammie Mcgee, PT, DPT # [phone] 4:23 PM,04/12/23

## 2023-04-15 ENCOUNTER — Other Ambulatory Visit: Payer: Self-pay | Admitting: Internal Medicine

## 2023-04-15 ENCOUNTER — Ambulatory Visit
Admission: RE | Admit: 2023-04-15 | Discharge: 2023-04-15 | Disposition: A | Discharge status: Home / Self Care | Payer: Medicare Other | Source: Ambulatory Visit | Attending: Internal Medicine | Admitting: Internal Medicine

## 2023-04-15 DIAGNOSIS — M7989 Other specified soft tissue disorders: Secondary | ICD-10-CM | POA: Insufficient documentation

## 2023-04-15 DIAGNOSIS — M79662 Pain in left lower leg: Secondary | ICD-10-CM

## 2023-04-16 ENCOUNTER — Ambulatory Visit: Payer: Medicare Other | Admitting: Physical Therapy

## 2023-04-18 ENCOUNTER — Ambulatory Visit: Payer: Medicare Other | Admitting: Physical Therapy

## 2023-04-18 ENCOUNTER — Encounter: Payer: Self-pay | Admitting: Physical Therapy

## 2023-04-18 DIAGNOSIS — R262 Difficulty in walking, not elsewhere classified: Secondary | ICD-10-CM | POA: Diagnosis not present

## 2023-04-18 DIAGNOSIS — M5459 Other low back pain: Secondary | ICD-10-CM

## 2023-04-18 DIAGNOSIS — R2681 Unsteadiness on feet: Secondary | ICD-10-CM

## 2023-04-18 DIAGNOSIS — R2689 Other abnormalities of gait and mobility: Secondary | ICD-10-CM

## 2023-04-18 DIAGNOSIS — M6281 Muscle weakness (generalized): Secondary | ICD-10-CM

## 2023-04-18 NOTE — Therapy (Signed)
OUTPATIENT PHYSICAL THERAPY THORACOLUMBAR TREATMENT  Patient Name: Hannah Powell MRN: [id number] DOB:[birth year], 78 y.o., female Today's Date: 04/18/2023  END OF SESSION:  PT End of Session - 04/18/23 1449     Visit Number 18    Number of Visits 30    Date for PT Re-Evaluation 04/18/23    PT Start Time 1440    Equipment Utilized During Treatment Gait belt    Activity Tolerance Patient tolerated treatment well    Behavior During Therapy WFL for tasks assessed/performed             Past Medical History:  Diagnosis Date   Anxiety    Arthritis    CHF (congestive heart failure) (HCC)    COPD (chronic obstructive pulmonary disease) (HCC)    Dysplastic nevus 04/22/2019   R abdomen parallel to sup umbilicus - mod   Dysplastic nevus 04/22/2019   R low back above waistline - mild    Edema    feet/legs   Hypertension    dr Darryll Capers      Hypothyroidism    Leg weakness, bilateral    Osteoporosis    Oxygen deficit    2l with bipap at night   RLS (restless legs syndrome)    Shortness of breath    Sleep apnea    bipap   Wheezing    Past Surgical History:  Procedure Laterality Date   BACK SURGERY     cervical   BREAST BIOPSY Left    needle bx-neg   CATARACT EXTRACTION W/PHACO Left 05/07/2017   Procedure: CATARACT EXTRACTION PHACO AND INTRAOCULAR LENS PLACEMENT (IOC);  Surgeon: Galen Manila, MD;  Location: [facility];  Service: Ophthalmology;  Laterality: Left;  [place] 00:48.7 AP% 11.3 CDE 5.46 Fluid Pack Lot # [id number]   CATARACT EXTRACTION W/PHACO Right 04/23/2017   Procedure: CATARACT EXTRACTION PHACO AND INTRAOCULAR LENS PLACEMENT (IOC);  Surgeon: Galen Manila, MD;  Location: [facility];  Service: Ophthalmology;  Laterality: Right;  [place] 00:29 AP% 12.8 CDE 3.79 FLUID PACK LOT # [id number]   CESAREAN SECTION     FOOT ARTHROPLASTY     JOINT REPLACEMENT     x2 tkr   TOTAL SHOULDER ARTHROPLASTY Left 07/02/2013   Procedure: LEFT TOTAL SHOULDER ARTHROPLASTY;   Surgeon: Senaida Lange, MD;  Location: [facility];  Service: Orthopedics;  Laterality: Left;   TOTAL SHOULDER ARTHROPLASTY Right 04/10/2018   Procedure: TOTAL REVERSE SHOULDER ARTHROPLASTY;  Surgeon: Francena Hanly, MD;  Location: [facility];  Service: Orthopedics;  Laterality: Right;    Patient Active Problem List   Diagnosis Date Noted   Bilateral leg pain 08/29/2022   Cervical radiculopathy 08/28/2022   CHF exacerbation (HCC) 08/26/2022   Swelling of lower extremity 08/26/2022   Acute on chronic respiratory failure with hypoxia and hypercapnia (HCC) 03/29/2022   Chronic diastolic CHF (congestive heart failure) (HCC) 03/29/2022   Cellulitis of right lower extremity 03/29/2022   Restless leg syndrome 02/08/2022   Hypothyroidism 02/08/2022   Obesity (BMI 30-39.9) 02/08/2022   Insomnia 02/08/2022   Anxiety 02/08/2022   Poor appetite 02/08/2022   S/P reverse total shoulder arthroplasty, 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   Chronic diastolic heart failure (HCC) 08/09/2016   COPD (chronic obstructive pulmonary disease) (HCC) 08/09/2016   Pulmonary infiltrates    Pneumonia due to Streptococcus pneumoniae (HCC)    Fever    Community acquired pneumonia of right lung    Palliative care by specialist    Goals of care, counseling/discussion    Pressure injury of  skin 07/03/2016   Acute respiratory failure with hypoxia (HCC) 07/02/2016   Acute on chronic diastolic CHF (congestive heart failure) (HCC) 07/21/2015   COPD exacerbation (HCC) 07/21/2015   Acute renal insufficiency 07/21/2015   Hyperglycemia 07/21/2015   HTN (hypertension) 07/21/2015   S/P shoulder replacement 07/02/2013    PCP: Lynnea Ferrier, MD  REFERRING PROVIDER: Lynnea Ferrier, MD  REFERRING DIAG: Lumbar DDD, Neuropathy  Rationale for Evaluation and Treatment: Rehabilitation  THERAPY DIAG:  Difficulty in walking, not elsewhere classified  Muscle weakness (generalized)  Other low back pain  Imbalance  Unsteadiness on  feet  ONSET DATE: 03/20/2019  SUBJECTIVE:                                                                                                                                                                                           SUBJECTIVE STATEMENT: Pt. Reports chronic h/o lumbar stenosis/ back pain.  Pt. Reports 8/10 low back pain first thing in the morning and uses heating pad/ ibuprofen to decrease back pain to 1/10 after about 2 hours.  Pt. Reports no recent falls and using elevated RW for short distance ambulation and w/c for community and some household mobility.  Pt. States she has poor standing/walking endurance and is very slow with movement, which is why she requires use of w/c.  Increase episodes of COPD since last PT tx. Session.  Pt. Has 2 weddings to attend on 10/26 and 11/16.    PERTINENT HISTORY:  See MD notes.    PAIN:  Are you having pain? Yes: NPRS scale: 8/10 Pain location: low back Pain description: aching Aggravating factors: lying supine/ getting out of bed.  Relieving factors: moist heat  PRECAUTIONS: Fall  RED FLAGS: None   WEIGHT BEARING RESTRICTIONS: No  FALLS:  Has patient fallen in last 6 months? No  LIVING ENVIRONMENT: Lives with: lives with their spouse Lives in: House/apartment Stairs: No Has following equipment at home: Environmental consultant - 2 wheeled, Wheelchair (manual), and Tour manager  OCCUPATION: Retired  PLOF: Requires assistive device for independence  PATIENT GOALS: Increase standing/walking endurance.  Decrease low back pain.  NEXT MD VISIT: PRN  OBJECTIVE:  Note: Objective measures were completed at Evaluation unless otherwise noted.  DIAGNOSTIC FINDINGS:  COMPARISON:  Lumbar spine MRI from 06/26/2020   FINDINGS: Normal alignment. The vertebral body heights are well preserved. No signs of acute fracture or subluxation. Moderate multilevel disc space narrowing and anterior and posterior spur formation. Facet arthropathy identified  within the lower lumbar spine. Aortic atherosclerosis. Moderate stool burden noted within the colon.   IMPRESSION: 1. No acute findings. 2.  Moderate multilevel degenerative disc disease and facet arthropathy.  PATIENT SURVEYS:  FOTO initial 40/ goal 34  SCREENING FOR RED FLAGS: Bowel or bladder incontinence: yes Spinal tumors: No Cauda equina syndrome: No Compression fracture: No Abdominal aneurysm: No  COGNITION: Overall cognitive status: Within functional limits for tasks assessed   SENSATION: WFL, moderate lower leg swelling noted.  MUSCLE LENGTH: No supine positioning today Hamstrings: NT Thomas test: NT   POSTURE: rounded shoulders, forward head, increased thoracic kyphosis, and flexed trunk   PALPATION: Tenderness over lumbar paraspinals (generalized).  LUMBAR ROM:   AROM eval  Flexion 50% limited  Extension 100% limited  Right lateral flexion 50% limited  Left lateral flexion 50% limited  Right rotation 50% limited  Left rotation 50% limited   (Blank rows = not tested)  LOWER EXTREMITY ROM:       B hip flexion/ knee extension limited.    LOWER EXTREMITY MMT:       B LE muscle strength grossly 4/5 MMT except hamstring 4+/5 MMT and hip flexion 4-/5 MMT  Ascends steps with L LE and heavy UE assist.   LUMBAR SPECIAL TESTS:  Next tx. Session.  No supine positioning today.  FUNCTIONAL TESTS:  5 times sit to stand: 22.13 sec. With use of armrests.  Pt. Limited with STS from gray chair without UE assist.   6 minute walk test: TBD  GAIT: Distance walked: in clinic with elevated RW Assistive device utilized: Walker - 2 wheeled Level of assistance: Modified independence Comments: Significant forward head/ flexed posture/ thoracic kyphosis.  Slow, controlled gait pattern.  Fear of L knee buckling with R LE swing through phase of gait.   TODAY'S TREATMENT:                                                                                                                               DATE: 04/18/2023    Subjective:  Pt. Reports 1/10 central low back pain in sitting and pain decreases when pt is moving.  Pt. C/o 4/10 L lower leg (medial aspect) pain/tenderness.  Pt. Had MD appt. With Dr. Graciela Husbands on Tuesday to assess L medial calf pain.  No blood clot noted and pt. Diagnosed with bruise/ hematoma.    There.ex.:  Nustep L4 at seat position 11 for 10+ min. B UE/LE (consistent cadence)- MH to back.    Standing marching/ hip abduction 20x in //-bars (mirror feedback to correct head/upright posture).  No c/o back pain.      Neuro.mm.:  Sit to stands from gray chair in front of //-bars 5x.   Posture correction in sitting/standing with mirror feedback. Cuing to correction sh./head posture and prevent lateral lean.   PT discussed use of compression stockings/ ice and heat to manage   There.act.:  Standing functional reaching/ balance tasks with Blaze pods in //-bars: 3x1 minute on Focus.  Pt. Challanged with lateral steps while maintaining upright posture/ reaching.    Ambulate  60 feet from gym to outside of clinic with elevated RW and cuing for consistent recip. Gait pattern.  Pt. Cued to increase recip. Step pattern/ cadence.  Pt. Slows down gait at door thresholds/ carpets.     PATIENT EDUCATION:  Education details: Daily walking/ posture Person educated: Patient Education method: Medical illustrator Education comprehension: verbalized understanding and returned demonstration  HOME EXERCISE PROGRAM: See handouts  ASSESSMENT:  CLINICAL IMPRESSION:    Pt. Requires extra time and PT provided SBA for gait to the gym for safety.  PT continued to be challenged with standing functional reaching/ wt. Shifting tasks in //-bars.  Pt. Has new c/o L medial hematoma that is tender with palpation.  Pt. Works hard during PT tx. Session with several seated rest breaks to due fatigue/ low back discomfort.   Pt. Will benefit from skilled PT services in  combination with gym based ex. To improve LE strengthening/ pain-free mobility.   OBJECTIVE IMPAIRMENTS: Abnormal gait, decreased activity tolerance, decreased balance, decreased endurance, decreased mobility, difficulty walking, decreased ROM, decreased strength, hypomobility, increased edema, impaired flexibility, improper body mechanics, postural dysfunction, and pain.   ACTIVITY LIMITATIONS: bending, sitting, standing, squatting, stairs, transfers, bed mobility, toileting, and locomotion level  PARTICIPATION LIMITATIONS: medication management  PERSONAL FACTORS: Fitness and Past/current experiences are also affecting patient's functional outcome.   REHAB POTENTIAL: Good  CLINICAL DECISION MAKING: Evolving/moderate complexity  EVALUATION COMPLEXITY: Moderate   GOALS: Goals reviewed with patient? Yes  SHORT TERM GOALS: Target date: 03/13/23  Pt. Will be independent with HEP to increase B LE muscle strength 1/2 muscle grade to improve transfer/ safety with walking.  Baseline:  see above Goal status: Not met  2.  Pt. Will complete with no rest breaks to improve walking endurance.   Baseline: TBD.  12/5: not completed at this time secondary to increase mid-back pain and inability to walk 6 minutes.    Goal status: On-going   LONG TERM GOALS: Target date: 04/18/23  Pt. Will increase FOTO to 51 to improve pain-free functional mobility.  Baseline:  initial 40 Goal status: On-going  2.  Pt. Able to stand from 21# chair height with no UE assist or low back pain to improve transfers/ independence with daily activity.   Baseline: benefits from armrests/ UE assist.  Goal status: Not met  3.  Pt. Able to return to gym based ex. Program with no limitations or c/o low back pain.   Baseline: currently not exercising or going to gym.  12/5: pt. Returned to gym for a few weeks prior to family issues/ no recent gym visits. Goal status: Partially met   PLAN:   PT FREQUENCY:  2x/week  PT DURATION: 8 weeks  PLANNED INTERVENTIONS: Therapeutic exercises, Therapeutic activity, Neuromuscular re-education, Balance training, Gait training, Patient/Family education, Self Care, Joint mobilization, DME instructions, Dry Needling, Electrical stimulation, Cryotherapy, Moist heat, and Manual therapy.  PLAN FOR NEXT SESSION: Standing there.ex./ posture correction with mirror feedback.   CHECK GOALS next tx.   Cammie Mcgee, PT, DPT # [phone] 2:49 PM,04/18/23

## 2023-04-22 ENCOUNTER — Encounter: Payer: Self-pay | Admitting: Physical Therapy

## 2023-04-22 ENCOUNTER — Ambulatory Visit: Payer: Medicare Other | Attending: Internal Medicine | Admitting: Physical Therapy

## 2023-04-22 DIAGNOSIS — R2681 Unsteadiness on feet: Secondary | ICD-10-CM | POA: Diagnosis present

## 2023-04-22 DIAGNOSIS — R293 Abnormal posture: Secondary | ICD-10-CM | POA: Insufficient documentation

## 2023-04-22 DIAGNOSIS — M5459 Other low back pain: Secondary | ICD-10-CM | POA: Diagnosis present

## 2023-04-22 DIAGNOSIS — R269 Unspecified abnormalities of gait and mobility: Secondary | ICD-10-CM | POA: Diagnosis present

## 2023-04-22 DIAGNOSIS — R262 Difficulty in walking, not elsewhere classified: Secondary | ICD-10-CM | POA: Insufficient documentation

## 2023-04-22 DIAGNOSIS — M6281 Muscle weakness (generalized): Secondary | ICD-10-CM | POA: Diagnosis present

## 2023-04-22 DIAGNOSIS — R2689 Other abnormalities of gait and mobility: Secondary | ICD-10-CM | POA: Diagnosis present

## 2023-04-22 NOTE — Therapy (Signed)
OUTPATIENT PHYSICAL THERAPY THORACOLUMBAR TREATMENT/ RECERTIFICATION  Patient Name: Hannah Powell MRN: [id number] DOB:05-Nov-1945, 78 y.o., female Today's Date: 04/23/2023  END OF SESSION:  PT End of Session - 04/22/23 1305     Visit Number 19    Number of Visits 31    Date for PT Re-Evaluation 06/03/23    PT Start Time 1305    PT Stop Time 1402    PT Time Calculation (min) 57 min    Equipment Utilized During Treatment Gait belt    Activity Tolerance Patient tolerated treatment well    Behavior During Therapy WFL for tasks assessed/performed             Past Medical History:  Diagnosis Date   Anxiety    Arthritis    CHF (congestive heart failure) (HCC)    COPD (chronic obstructive pulmonary disease) (HCC)    Dysplastic nevus 04/22/2019   R abdomen parallel to sup umbilicus - mod   Dysplastic nevus 04/22/2019   R low back above waistline - mild    Edema    feet/legs   Hypertension    dr Darryll Capers      Hypothyroidism    Leg weakness, bilateral    Osteoporosis    Oxygen deficit    2l with bipap at night   RLS (restless legs syndrome)    Shortness of breath    Sleep apnea    bipap   Wheezing    Past Surgical History:  Procedure Laterality Date   BACK SURGERY     cervical   BREAST BIOPSY Left    needle bx-neg   CATARACT EXTRACTION W/PHACO Left 05/07/2017   Procedure: CATARACT EXTRACTION PHACO AND INTRAOCULAR LENS PLACEMENT (IOC);  Surgeon: Galen Manila, MD;  Location: [facility];  Service: Ophthalmology;  Laterality: Left;  [place] 00:48.7 AP% 11.3 CDE 5.46 Fluid Pack Lot # [id number]   CATARACT EXTRACTION W/PHACO Right 04/23/2017   Procedure: CATARACT EXTRACTION PHACO AND INTRAOCULAR LENS PLACEMENT (IOC);  Surgeon: Galen Manila, MD;  Location: [facility];  Service: Ophthalmology;  Laterality: Right;  [place] 00:29 AP% 12.8 CDE 3.79 FLUID PACK LOT # [id number]   CESAREAN SECTION     FOOT ARTHROPLASTY     JOINT REPLACEMENT     x2 tkr   TOTAL SHOULDER  ARTHROPLASTY Left 07/02/2013   Procedure: LEFT TOTAL SHOULDER ARTHROPLASTY;  Surgeon: Senaida Lange, MD;  Location: [facility];  Service: Orthopedics;  Laterality: Left;   TOTAL SHOULDER ARTHROPLASTY Right 04/10/2018   Procedure: TOTAL REVERSE SHOULDER ARTHROPLASTY;  Surgeon: Francena Hanly, MD;  Location: [facility];  Service: Orthopedics;  Laterality: Right;    Patient Active Problem List   Diagnosis Date Noted   Bilateral leg pain 08/29/2022   Cervical radiculopathy 08/28/2022   CHF exacerbation (HCC) 08/26/2022   Swelling of lower extremity 08/26/2022   Acute on chronic respiratory failure with hypoxia and hypercapnia (HCC) 03/29/2022   Chronic diastolic CHF (congestive heart failure) (HCC) 03/29/2022   Cellulitis of right lower extremity 03/29/2022   Restless leg syndrome 02/08/2022   Hypothyroidism 02/08/2022   Obesity (BMI 30-39.9) 02/08/2022   Insomnia 02/08/2022   Anxiety 02/08/2022   Poor appetite 02/08/2022   S/P reverse total shoulder arthroplasty, 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   Chronic diastolic heart failure (HCC) 08/09/2016   COPD (chronic obstructive pulmonary disease) (HCC) 08/09/2016   Pulmonary infiltrates    Pneumonia due to Streptococcus pneumoniae (HCC)    Fever    Community acquired pneumonia of right lung  Palliative care by specialist    Goals of care, counseling/discussion    Pressure injury of skin 07/03/2016   Acute respiratory failure with hypoxia (HCC) 07/02/2016   Acute on chronic diastolic CHF (congestive heart failure) (HCC) 07/21/2015   COPD exacerbation (HCC) 07/21/2015   Acute renal insufficiency 07/21/2015   Hyperglycemia 07/21/2015   HTN (hypertension) 07/21/2015   S/P shoulder replacement 07/02/2013    PCP: Lynnea Ferrier, MD  REFERRING PROVIDER: Lynnea Ferrier, MD  REFERRING DIAG: Lumbar DDD, Neuropathy  Rationale for Evaluation and Treatment: Rehabilitation  THERAPY DIAG:  Difficulty in walking, not elsewhere classified  Muscle  weakness (generalized)  Other low back pain  Imbalance  Unsteadiness on feet  Gait difficulty  Abnormal posture  ONSET DATE: 03/20/2019  SUBJECTIVE:                                                                                                                                                                                           SUBJECTIVE STATEMENT: Pt. Reports chronic h/o lumbar stenosis/ back pain.  Pt. Reports 8/10 low back pain first thing in the morning and uses heating pad/ ibuprofen to decrease back pain to 1/10 after about 2 hours.  Pt. Reports no recent falls and using elevated RW for short distance ambulation and w/c for community and some household mobility.  Pt. States she has poor standing/walking endurance and is very slow with movement, which is why she requires use of w/c.  Increase episodes of COPD since last PT tx. Session.  Pt. Has 2 weddings to attend on 10/26 and 11/16.    PERTINENT HISTORY:  See MD notes.    PAIN:  Are you having pain? Yes: NPRS scale: 8/10 Pain location: low back Pain description: aching Aggravating factors: lying supine/ getting out of bed.  Relieving factors: moist heat  PRECAUTIONS: Fall  RED FLAGS: None   WEIGHT BEARING RESTRICTIONS: No  FALLS:  Has patient fallen in last 6 months? No  LIVING ENVIRONMENT: Lives with: lives with their spouse Lives in: House/apartment Stairs: No Has following equipment at home: Environmental consultant - 2 wheeled, Wheelchair (manual), and Tour manager  OCCUPATION: Retired  PLOF: Requires assistive device for independence  PATIENT GOALS: Increase standing/walking endurance.  Decrease low back pain.  NEXT MD VISIT: PRN  OBJECTIVE:  Note: Objective measures were completed at Evaluation unless otherwise noted.  DIAGNOSTIC FINDINGS:  COMPARISON:  Lumbar spine MRI from 06/26/2020   FINDINGS: Normal alignment. The vertebral body heights are well preserved. No signs of acute fracture or subluxation.  Moderate multilevel disc space narrowing and anterior and posterior spur formation. Facet arthropathy  identified within the lower lumbar spine. Aortic atherosclerosis. Moderate stool burden noted within the colon.   IMPRESSION: 1. No acute findings. 2. Moderate multilevel degenerative disc disease and facet arthropathy.  PATIENT SURVEYS:  FOTO initial 40/ goal 62  SCREENING FOR RED FLAGS: Bowel or bladder incontinence: yes Spinal tumors: No Cauda equina syndrome: No Compression fracture: No Abdominal aneurysm: No  COGNITION: Overall cognitive status: Within functional limits for tasks assessed   SENSATION: WFL, moderate lower leg swelling noted.  MUSCLE LENGTH: No supine positioning today Hamstrings: NT Thomas test: NT   POSTURE: rounded shoulders, forward head, increased thoracic kyphosis, and flexed trunk   PALPATION: Tenderness over lumbar paraspinals (generalized).  LUMBAR ROM:   AROM eval  Flexion 50% limited  Extension 100% limited  Right lateral flexion 50% limited  Left lateral flexion 50% limited  Right rotation 50% limited  Left rotation 50% limited   (Blank rows = not tested)  LOWER EXTREMITY ROM:       B hip flexion/ knee extension limited.    LOWER EXTREMITY MMT:       B LE muscle strength grossly 4/5 MMT except hamstring 4+/5 MMT and hip flexion 4-/5 MMT  Ascends steps with L LE and heavy UE assist.   LUMBAR SPECIAL TESTS:  Next tx. Session.  No supine positioning today.  FUNCTIONAL TESTS:  5 times sit to stand: 22.13 sec. With use of armrests.  Pt. Limited with STS from gray chair without UE assist.   6 minute walk test: TBD  GAIT: Distance walked: in clinic with elevated RW Assistive device utilized: Walker - 2 wheeled Level of assistance: Modified independence Comments: Significant forward head/ flexed posture/ thoracic kyphosis.  Slow, controlled gait pattern.  Fear of L knee buckling with R LE swing through phase of gait.    TODAY'S TREATMENT:                                                                                                                              DATE: 04/23/2023    Subjective:  Pt. Reports 1/10 central low back pain in sitting and pain decreases when pt is moving.  Pt. Wearing compression stockings and reports improvement with L lower leg swelling, esp. In the mornings.    There.ex.:  Nustep L4 at seat position 11 for 10+ min. B UE/LE (consistent cadence)- MH to back.    Sit to stands from gray chair in front of //-bars 5x.  L UE assist on arm rest.    Seated LAQ/ alt. UE and LE marching 20x.  Seated Nautilus: 50# lat. Pull downs 20x2.   There.act.:  TRX 8x (fatigue)- rest break.  10x.    Walking in //-bars from blue line to blue line (1 lap)-  20.75 seconds/ 18.56 seconds.    Ambulate 60 feet from gym to outside of clinic with elevated RW and cuing for consistent recip. Gait pattern.  Pt. Cued to increase recip. Step pattern/  cadence.  Pt. Slows down gait at door thresholds/ carpets.    Posture correction in sitting/standing with mirror feedback. Cuing to correction sh./head posture and prevent lateral lean.    PATIENT EDUCATION:  Education details: Daily walking/ posture Person educated: Patient Education method: Medical illustrator Education comprehension: verbalized understanding and returned demonstration  HOME EXERCISE PROGRAM: See handouts  ASSESSMENT:  CLINICAL IMPRESSION:    Pt. Requires extra time and PT provided SBA for gait to the gym for safety.  PT continued to be challenged with standing functional reaching/ wt. Shifting tasks in //-bars.  Pt. Has continued c/o L medial hematoma that is tender with palpation.  Pts. Symptoms have improved with ice/heat and use of compression stockings.  Pt. Works hard during PT tx. Session with several seated rest breaks to due fatigue/ low back discomfort.   Pt. Will benefit from skilled PT services in combination with  gym based ex. To improve LE strengthening/ pain-free mobility.   OBJECTIVE IMPAIRMENTS: Abnormal gait, decreased activity tolerance, decreased balance, decreased endurance, decreased mobility, difficulty walking, decreased ROM, decreased strength, hypomobility, increased edema, impaired flexibility, improper body mechanics, postural dysfunction, and pain.   ACTIVITY LIMITATIONS: bending, sitting, standing, squatting, stairs, transfers, bed mobility, toileting, and locomotion level  PARTICIPATION LIMITATIONS: medication management  PERSONAL FACTORS: Fitness and Past/current experiences are also affecting patient's functional outcome.   REHAB POTENTIAL: Good  CLINICAL DECISION MAKING: Evolving/moderate complexity  EVALUATION COMPLEXITY: Moderate   GOALS: Goals reviewed with patient? Yes  SHORT TERM GOALS: Target date: 05/13/23  Pt. Will be independent with HEP to increase B LE muscle strength 1/2 muscle grade to improve transfer/ safety with walking.  Baseline:  see above Goal status: Not met  2.  Pt. Will complete with no rest breaks to improve walking endurance.   Baseline: TBD.  12/5: not completed at this time secondary to increase mid-back pain and inability to walk 6 minutes.    Goal status: On-going   LONG TERM GOALS: Target date: 06/03/23  Pt. Will increase FOTO to 51 to improve pain-free functional mobility.  Baseline:  initial 40 Goal status: On-going  2.  Pt. Able to stand from 21# chair height with no UE assist or low back pain to improve transfers/ independence with daily activity.   Baseline: benefits from armrests/ UE assist.  Goal status: Not met  3.  Pt. Able to return to gym based ex. Program with no limitations or c/o low back pain.   Baseline: currently not exercising or going to gym.  12/5: pt. Returned to gym for a few weeks prior to family issues/ no recent gym visits. Goal status: Partially met   PLAN:   PT FREQUENCY: 2x/week  PT DURATION: 6  weeks  PLANNED INTERVENTIONS: Therapeutic exercises, Therapeutic activity, Neuromuscular re-education, Balance training, Gait training, Patient/Family education, Self Care, Joint mobilization, DME instructions, Dry Needling, Electrical stimulation, Cryotherapy, Moist heat, and Manual therapy.  PLAN FOR NEXT SESSION: Standing there.ex./ posture correction with mirror feedback.     Cammie Mcgee, PT, DPT # [id number] 12:24 PM,04/23/23

## 2023-04-30 ENCOUNTER — Ambulatory Visit: Payer: Medicare Other | Admitting: Physical Therapy

## 2023-05-02 ENCOUNTER — Encounter: Payer: Self-pay | Admitting: Physical Therapy

## 2023-05-02 ENCOUNTER — Ambulatory Visit: Payer: Medicare Other | Admitting: Physical Therapy

## 2023-05-02 DIAGNOSIS — R2689 Other abnormalities of gait and mobility: Secondary | ICD-10-CM

## 2023-05-02 DIAGNOSIS — M6281 Muscle weakness (generalized): Secondary | ICD-10-CM

## 2023-05-02 DIAGNOSIS — R262 Difficulty in walking, not elsewhere classified: Secondary | ICD-10-CM | POA: Diagnosis not present

## 2023-05-02 DIAGNOSIS — M5459 Other low back pain: Secondary | ICD-10-CM

## 2023-05-02 DIAGNOSIS — R2681 Unsteadiness on feet: Secondary | ICD-10-CM

## 2023-05-02 NOTE — Therapy (Signed)
OUTPATIENT PHYSICAL THERAPY THORACOLUMBAR TREATMENT Physical Therapy Progress Note  Dates of reporting period  01/31/23   to   05/02/23   Patient Name: Hannah Powell MRN: [id number] DOB:1946-01-29, 78 y.o., female Today's Date: 05/02/2023  END OF SESSION:  PT End of Session - 05/02/23 1426     Visit Number 20    Number of Visits 31    Date for PT Re-Evaluation 06/03/23    PT Start Time 1427    PT Stop Time 1520    PT Time Calculation (min) 53 min    Equipment Utilized During Treatment Gait belt    Activity Tolerance Patient tolerated treatment well    Behavior During Therapy WFL for tasks assessed/performed             Past Medical History:  Diagnosis Date   Anxiety    Arthritis    CHF (congestive heart failure) (HCC)    COPD (chronic obstructive pulmonary disease) (HCC)    Dysplastic nevus 04/22/2019   R abdomen parallel to sup umbilicus - mod   Dysplastic nevus 04/22/2019   R low back above waistline - mild    Edema    feet/legs   Hypertension    dr Darryll Capers      Hypothyroidism    Leg weakness, bilateral    Osteoporosis    Oxygen deficit    2l with bipap at night   RLS (restless legs syndrome)    Shortness of breath    Sleep apnea    bipap   Wheezing    Past Surgical History:  Procedure Laterality Date   BACK SURGERY     cervical   BREAST BIOPSY Left    needle bx-neg   CATARACT EXTRACTION W/PHACO Left 05/07/2017   Procedure: CATARACT EXTRACTION PHACO AND INTRAOCULAR LENS PLACEMENT (IOC);  Surgeon: Galen Manila, MD;  Location: [facility];  Service: Ophthalmology;  Laterality: Left;  [place] 00:48.7 AP% 11.3 CDE 5.46 Fluid Pack Lot # [id number]   CATARACT EXTRACTION W/PHACO Right 04/23/2017   Procedure: CATARACT EXTRACTION PHACO AND INTRAOCULAR LENS PLACEMENT (IOC);  Surgeon: Galen Manila, MD;  Location: [facility];  Service: Ophthalmology;  Laterality: Right;  [place] 00:29 AP% 12.8 CDE 3.79 FLUID PACK LOT # [id number]   CESAREAN SECTION     FOOT  ARTHROPLASTY     JOINT REPLACEMENT     x2 tkr   TOTAL SHOULDER ARTHROPLASTY Left 07/02/2013   Procedure: LEFT TOTAL SHOULDER ARTHROPLASTY;  Surgeon: Senaida Lange, MD;  Location: [facility];  Service: Orthopedics;  Laterality: Left;   TOTAL SHOULDER ARTHROPLASTY Right 04/10/2018   Procedure: TOTAL REVERSE SHOULDER ARTHROPLASTY;  Surgeon: Francena Hanly, MD;  Location: [facility];  Service: Orthopedics;  Laterality: Right;    Patient Active Problem List   Diagnosis Date Noted   Bilateral leg pain 08/29/2022   Cervical radiculopathy 08/28/2022   CHF exacerbation (HCC) 08/26/2022   Swelling of lower extremity 08/26/2022   Acute on chronic respiratory failure with hypoxia and hypercapnia (HCC) 03/29/2022   Chronic diastolic CHF (congestive heart failure) (HCC) 03/29/2022   Cellulitis of right lower extremity 03/29/2022   Restless leg syndrome 02/08/2022   Hypothyroidism 02/08/2022   Obesity (BMI 30-39.9) 02/08/2022   Insomnia 02/08/2022   Anxiety 02/08/2022   Poor appetite 02/08/2022   S/P reverse total shoulder arthroplasty, 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   Chronic diastolic heart failure (HCC) 08/09/2016   COPD (chronic obstructive pulmonary disease) (HCC) 08/09/2016   Pulmonary infiltrates    Pneumonia due to Streptococcus pneumoniae (  HCC)    Fever    Community acquired pneumonia of right lung    Palliative care by specialist    Goals of care, counseling/discussion    Pressure injury of skin 07/03/2016   Acute respiratory failure with hypoxia (HCC) 07/02/2016   Acute on chronic diastolic CHF (congestive heart failure) (HCC) 07/21/2015   COPD exacerbation (HCC) 07/21/2015   Acute renal insufficiency 07/21/2015   Hyperglycemia 07/21/2015   HTN (hypertension) 07/21/2015   S/P shoulder replacement 07/02/2013    PCP: Lynnea Ferrier, MD  REFERRING PROVIDER: Lynnea Ferrier, MD  REFERRING DIAG: Lumbar DDD, Neuropathy  Rationale for Evaluation and Treatment: Rehabilitation  THERAPY  DIAG:  Difficulty in walking, not elsewhere classified  Muscle weakness (generalized)  Other low back pain  Imbalance  Unsteadiness on feet  ONSET DATE: 03/20/2019  SUBJECTIVE:                                                                                                                                                                                           SUBJECTIVE STATEMENT: Pt. Reports chronic h/o lumbar stenosis/ back pain.  Pt. Reports 8/10 low back pain first thing in the morning and uses heating pad/ ibuprofen to decrease back pain to 1/10 after about 2 hours.  Pt. Reports no recent falls and using elevated RW for short distance ambulation and w/c for community and some household mobility.  Pt. States she has poor standing/walking endurance and is very slow with movement, which is why she requires use of w/c.  Increase episodes of COPD since last PT tx. Session.  Pt. Has 2 weddings to attend on 10/26 and 11/16.    PERTINENT HISTORY:  See MD notes.    PAIN:  Are you having pain? Yes: NPRS scale: 8/10 Pain location: low back Pain description: aching Aggravating factors: lying supine/ getting out of bed.  Relieving factors: moist heat  PRECAUTIONS: Fall  RED FLAGS: None   WEIGHT BEARING RESTRICTIONS: No  FALLS:  Has patient fallen in last 6 months? No  LIVING ENVIRONMENT: Lives with: lives with their spouse Lives in: House/apartment Stairs: No Has following equipment at home: Environmental consultant - 2 wheeled, Wheelchair (manual), and Tour manager  OCCUPATION: Retired  PLOF: Requires assistive device for independence  PATIENT GOALS: Increase standing/walking endurance.  Decrease low back pain.  NEXT MD VISIT: PRN  OBJECTIVE:  Note: Objective measures were completed at Evaluation unless otherwise noted.  DIAGNOSTIC FINDINGS:  COMPARISON:  Lumbar spine MRI from 06/26/2020   FINDINGS: Normal alignment. The vertebral body heights are well preserved. No signs of acute  fracture or subluxation. Moderate multilevel  disc space narrowing and anterior and posterior spur formation. Facet arthropathy identified within the lower lumbar spine. Aortic atherosclerosis. Moderate stool burden noted within the colon.   IMPRESSION: 1. No acute findings. 2. Moderate multilevel degenerative disc disease and facet arthropathy.  PATIENT SURVEYS:  FOTO initial 40/ goal 72  SCREENING FOR RED FLAGS: Bowel or bladder incontinence: yes Spinal tumors: No Cauda equina syndrome: No Compression fracture: No Abdominal aneurysm: No  COGNITION: Overall cognitive status: Within functional limits for tasks assessed   SENSATION: WFL, moderate lower leg swelling noted.  MUSCLE LENGTH: No supine positioning today Hamstrings: NT Thomas test: NT   POSTURE: rounded shoulders, forward head, increased thoracic kyphosis, and flexed trunk   PALPATION: Tenderness over lumbar paraspinals (generalized).  LUMBAR ROM:   AROM eval  Flexion 50% limited  Extension 100% limited  Right lateral flexion 50% limited  Left lateral flexion 50% limited  Right rotation 50% limited  Left rotation 50% limited   (Blank rows = not tested)  LOWER EXTREMITY ROM:       B hip flexion/ knee extension limited.    LOWER EXTREMITY MMT:       B LE muscle strength grossly 4/5 MMT except hamstring 4+/5 MMT and hip flexion 4-/5 MMT  Ascends steps with L LE and heavy UE assist.   LUMBAR SPECIAL TESTS:  Next tx. Session.  No supine positioning today.  FUNCTIONAL TESTS:  5 times sit to stand: 22.13 sec. With use of armrests.  Pt. Limited with STS from gray chair without UE assist.   6 minute walk test: TBD  GAIT: Distance walked: in clinic with elevated RW Assistive device utilized: Walker - 2 wheeled Level of assistance: Modified independence Comments: Significant forward head/ flexed posture/ thoracic kyphosis.  Slow, controlled gait pattern.  Fear of L knee buckling with R LE swing through  phase of gait.   Walking in //-bars from blue line to blue line (1 lap)-  20.75 seconds/ 18.56 seconds.    TODAY'S TREATMENT:                                                                                                                              DATE: 05/02/2023    Subjective:  Pt. Reports 0-1/10 central low back pain in sitting and pain decreases when pt is moving.  Pt. States she hasn't been to active this week.     There.ex.:  Nustep L4 at seat position 11 for 10 min. B UE/LE (consistent cadence)- MH to back.    Seated LAQ/ LE marching/ shoulder flexion with posture correction 20x.  Seated Nautilus: 50# lat. Pull downs 20x2.   There.act.:  TRX squats with Airex pad in gray chair to limited knee flexion/ improve control.    Ambulate 60 feet from gym to outside of clinic with elevated RW and cuing for consistent recip. Gait pattern.  Pt. Cued to increase recip. Step pattern/ cadence.  Pt. Slows down gait at  door thresholds/ carpets.    Posture correction in sitting/standing with mirror feedback. Cuing to correction sh./head posture and prevent lateral lean.    PATIENT EDUCATION:  Education details: Daily walking/ posture Person educated: Patient Education method: Medical illustrator Education comprehension: verbalized understanding and returned demonstration  HOME EXERCISE PROGRAM: See handouts  ASSESSMENT:  CLINICAL IMPRESSION:    Pt. Requires extra time and PT provided SBA for gait to the gym for safety.  PT continued to be challenged with standing functional reaching/ wt. Shifting tasks in //-bars.  Marked improvement in L medial hematoma that is tender with palpation.  Pt. Works hard during PT tx. Session with several seated rest breaks to due fatigue/ low back discomfort.   Pt. Will benefit from skilled PT services in combination with gym based ex. To improve LE strengthening/ pain-free mobility.   OBJECTIVE IMPAIRMENTS: Abnormal gait, decreased activity  tolerance, decreased balance, decreased endurance, decreased mobility, difficulty walking, decreased ROM, decreased strength, hypomobility, increased edema, impaired flexibility, improper body mechanics, postural dysfunction, and pain.   ACTIVITY LIMITATIONS: bending, sitting, standing, squatting, stairs, transfers, bed mobility, toileting, and locomotion level  PARTICIPATION LIMITATIONS: medication management  PERSONAL FACTORS: Fitness and Past/current experiences are also affecting patient's functional outcome.   REHAB POTENTIAL: Good  CLINICAL DECISION MAKING: Evolving/moderate complexity  EVALUATION COMPLEXITY: Moderate   GOALS: Goals reviewed with patient? Yes  SHORT TERM GOALS: Target date: 05/13/23  Pt. Will be independent with HEP to increase B LE muscle strength 1/2 muscle grade to improve transfer/ safety with walking.  Baseline:  see above Goal status: Not met  2.  Pt. Will complete with no rest breaks to improve walking endurance.   Baseline: TBD.  12/5: not completed at this time secondary to increase mid-back pain and inability to walk 6 minutes.    Goal status: On-going   LONG TERM GOALS: Target date: 06/03/23  Pt. Will increase FOTO to 51 to improve pain-free functional mobility.  Baseline:  initial 40 Goal status: On-going  2.  Pt. Able to stand from 21# chair height with no UE assist or low back pain to improve transfers/ independence with daily activity.   Baseline: benefits from armrests/ UE assist.  Goal status: Not met  3.  Pt. Able to return to gym based ex. Program with no limitations or c/o low back pain.   Baseline: currently not exercising or going to gym.  12/5: pt. Returned to gym for a few weeks prior to family issues/ no recent gym visits. Goal status: Partially met   PLAN:   PT FREQUENCY: 2x/week  PT DURATION: 6 weeks  PLANNED INTERVENTIONS: Therapeutic exercises, Therapeutic activity, Neuromuscular re-education, Balance training,  Gait training, Patient/Family education, Self Care, Joint mobilization, DME instructions, Dry Needling, Electrical stimulation, Cryotherapy, Moist heat, and Manual therapy.  PLAN FOR NEXT SESSION: Standing there.ex./ posture correction with mirror feedback.     Cammie Mcgee, PT, DPT # [phone] 4:35 PM,05/02/23

## 2023-05-03 ENCOUNTER — Encounter: Payer: Self-pay | Admitting: Obstetrics and Gynecology

## 2023-05-03 ENCOUNTER — Ambulatory Visit: Payer: Medicare Other | Admitting: Obstetrics and Gynecology

## 2023-05-03 VITALS — BP 119/77 | HR 69

## 2023-05-03 DIAGNOSIS — N393 Stress incontinence (female) (male): Secondary | ICD-10-CM | POA: Insufficient documentation

## 2023-05-03 DIAGNOSIS — N3281 Overactive bladder: Secondary | ICD-10-CM | POA: Diagnosis not present

## 2023-05-03 NOTE — Assessment & Plan Note (Signed)
-   Reviewed options of percutaneous tibial nerve stimulation, intravesical botox or sacral nerve stimulation. She would be interested in PTNS. We discussed this involves 12 weekly visits with tapered therapy after. Many people do not see improvement for several weeks. Will have her keep a baseline 3 day bladder diary prior to first treatment session.  - Will continue on the trospium for now and do in combination with PTNS.

## 2023-05-03 NOTE — Assessment & Plan Note (Signed)
-   Did not have good success with urethral bulking. We discussed the option of a second top up of bulking but unclear if this would be of benefit if she did not notice any improvement with the first injection.  - She is not interested in surgery  - Continue with her incontinence ring as needed

## 2023-05-03 NOTE — Progress Notes (Signed)
[facility] Return Visit  SUBJECTIVE  History of Present Illness: Hannah Powell is a 78 y.o. female seen in follow-up for mixed incontinence. Underwent urethral bulking for SUI on 04/04/23.  Has been on trospium 60mg  ER daily. Has previously tried Myrbetriq, gemtesa for OAB.   She still has leakage with cough/ lifting/ bending- did not see any difference after the bulking.  Trospium is still helping her to have better control overall, better than the other medications. She is only drinking water or the occasional juice or half and half tea. But still having leakage in the mornings and after taking   She is still using the #1 incontinence pessary and cleans it herself.   Past Medical History: Patient  has a past medical history of Anxiety, Arthritis, CHF (congestive heart failure) (HCC), COPD (chronic obstructive pulmonary disease) (HCC), Dysplastic nevus (04/22/2019), Dysplastic nevus (04/22/2019), Edema, Hypertension, Hypothyroidism, Leg weakness, bilateral, Osteoporosis, Oxygen deficit, RLS (restless legs syndrome), Shortness of breath, Sleep apnea, and Wheezing.   Past Surgical History: She  has a past surgical history that includes Back surgery; Joint replacement; Foot arthroplasty; Total shoulder arthroplasty (Left, 07/02/2013); Breast biopsy (Left); Cataract extraction w/PHACO (Left, 05/07/2017); Cataract extraction w/PHACO (Right, 04/23/2017); Total shoulder arthroplasty (Right, 04/10/2018); and Cesarean section.   Medications: She has a current medication list which includes the following prescription(s): alprazolam, aspirin ec, azelastine, b-complex with vitamin c, calcium carbonate, chlorpheniramine-hydrocodone, diazepam, estradiol, ferrous sulfate, breo ellipta, ipratropium-albuterol, ketoconazole, levothyroxine, loratadine-pseudoephedrine, modafinil, multiple vitamins-minerals, omega-3 fatty acids, potassium chloride sa, pramipexole, semaglutide, spironolactone,  topiramate, tramadol, trazodone, trospium chloride, and ventolin hfa.   Allergies: Patient is allergic to empagliflozin and lyrica [pregabalin].   Social History: Patient  reports that she quit smoking about 48 years ago. Her smoking use included cigarettes. She started smoking about 58 years ago. She has a 10 pack-year smoking history. She has never used smokeless tobacco. She reports that she does not drink alcohol and does not use drugs.     OBJECTIVE     Physical Exam: Vitals:   05/03/23 1136  BP: 119/77  Pulse: 69   Gen: No apparent distress, A&O x 3.  Detailed Urogynecologic Evaluation:  Deferred.    ASSESSMENT AND PLAN    Hannah Powell is a 78 y.o. with:  1. SUI (stress urinary incontinence, female)   2. Overactive bladder     SUI (stress urinary incontinence, female) Assessment & Plan: - Did not have good success with urethral bulking. We discussed the option of a second top up of bulking but unclear if this would be of benefit if she did not notice any improvement with the first injection.  - She is not interested in surgery  - Continue with her incontinence ring as needed   Overactive bladder Assessment & Plan: - Reviewed options of percutaneous tibial nerve stimulation, intravesical botox or sacral nerve stimulation. She would be interested in PTNS. We discussed this involves 12 weekly visits with tapered therapy after. Many people do not see improvement for several weeks. Will have her keep a baseline 3 day bladder diary prior to first treatment session.  - Will continue on the trospium for now and do in combination with PTNS.       Marguerita Beards, MD

## 2023-05-07 ENCOUNTER — Ambulatory Visit: Payer: Medicare Other | Admitting: Physical Therapy

## 2023-05-07 ENCOUNTER — Encounter: Payer: Self-pay | Admitting: Physical Therapy

## 2023-05-07 DIAGNOSIS — R2689 Other abnormalities of gait and mobility: Secondary | ICD-10-CM

## 2023-05-07 DIAGNOSIS — R262 Difficulty in walking, not elsewhere classified: Secondary | ICD-10-CM

## 2023-05-07 DIAGNOSIS — M5459 Other low back pain: Secondary | ICD-10-CM

## 2023-05-07 DIAGNOSIS — R2681 Unsteadiness on feet: Secondary | ICD-10-CM

## 2023-05-07 DIAGNOSIS — M6281 Muscle weakness (generalized): Secondary | ICD-10-CM

## 2023-05-07 NOTE — Therapy (Signed)
OUTPATIENT PHYSICAL THERAPY THORACOLUMBAR TREATMENT  Patient Name: Hannah Powell MRN: [id number] DOB:Dec 07, 1945, 78 y.o., female Today's Date: 05/07/2023  END OF SESSION:  PT End of Session - 05/07/23 1108     Visit Number 21    Number of Visits 31    Date for PT Re-Evaluation 06/03/23    PT Start Time 1108    PT Stop Time 1208    PT Time Calculation (min) 60 min    Equipment Utilized During Treatment Gait belt    Activity Tolerance Patient tolerated treatment well    Behavior During Therapy WFL for tasks assessed/performed            Past Medical History:  Diagnosis Date   Anxiety    Arthritis    CHF (congestive heart failure) (HCC)    COPD (chronic obstructive pulmonary disease) (HCC)    Dysplastic nevus 04/22/2019   R abdomen parallel to sup umbilicus - mod   Dysplastic nevus 04/22/2019   R low back above waistline - mild    Edema    feet/legs   Hypertension    dr Darryll Capers      Hypothyroidism    Leg weakness, bilateral    Osteoporosis    Oxygen deficit    2l with bipap at night   RLS (restless legs syndrome)    Shortness of breath    Sleep apnea    bipap   Wheezing    Past Surgical History:  Procedure Laterality Date   BACK SURGERY     cervical   BREAST BIOPSY Left    needle bx-neg   CATARACT EXTRACTION W/PHACO Left 05/07/2017   Procedure: CATARACT EXTRACTION PHACO AND INTRAOCULAR LENS PLACEMENT (IOC);  Surgeon: Galen Manila, MD;  Location: [facility];  Service: Ophthalmology;  Laterality: Left;  [place] 00:48.7 AP% 11.3 CDE 5.46 Fluid Pack Lot # [id number]   CATARACT EXTRACTION W/PHACO Right 04/23/2017   Procedure: CATARACT EXTRACTION PHACO AND INTRAOCULAR LENS PLACEMENT (IOC);  Surgeon: Galen Manila, MD;  Location: [facility];  Service: Ophthalmology;  Laterality: Right;  [place] 00:29 AP% 12.8 CDE 3.79 FLUID PACK LOT # [id number]   CESAREAN SECTION     FOOT ARTHROPLASTY     JOINT REPLACEMENT     x2 tkr   TOTAL SHOULDER ARTHROPLASTY Left  07/02/2013   Procedure: LEFT TOTAL SHOULDER ARTHROPLASTY;  Surgeon: Senaida Lange, MD;  Location: [facility];  Service: Orthopedics;  Laterality: Left;   TOTAL SHOULDER ARTHROPLASTY Right 04/10/2018   Procedure: TOTAL REVERSE SHOULDER ARTHROPLASTY;  Surgeon: Francena Hanly, MD;  Location: [facility];  Service: Orthopedics;  Laterality: Right;    Patient Active Problem List   Diagnosis Date Noted   SUI (stress urinary incontinence, female) 05/03/2023   Overactive bladder 05/03/2023   Bilateral leg pain 08/29/2022   Cervical radiculopathy 08/28/2022   CHF exacerbation (HCC) 08/26/2022   Swelling of lower extremity 08/26/2022   Acute on chronic respiratory failure with hypoxia and hypercapnia (HCC) 03/29/2022   Chronic diastolic CHF (congestive heart failure) (HCC) 03/29/2022   Cellulitis of right lower extremity 03/29/2022   Restless leg syndrome 02/08/2022   Hypothyroidism 02/08/2022   Obesity (BMI 30-39.9) 02/08/2022   Insomnia 02/08/2022   Anxiety 02/08/2022   Poor appetite 02/08/2022   S/P reverse total shoulder arthroplasty, 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   Chronic diastolic heart failure (HCC) 08/09/2016   COPD (chronic obstructive pulmonary disease) (HCC) 08/09/2016   Pulmonary infiltrates    Pneumonia due to Streptococcus pneumoniae (HCC)    Fever  Community acquired pneumonia of right lung    Palliative care by specialist    Goals of care, counseling/discussion    Pressure injury of skin 07/03/2016   Acute respiratory failure with hypoxia (HCC) 07/02/2016   Acute on chronic diastolic CHF (congestive heart failure) (HCC) 07/21/2015   COPD exacerbation (HCC) 07/21/2015   Acute renal insufficiency 07/21/2015   Hyperglycemia 07/21/2015   HTN (hypertension) 07/21/2015   S/P shoulder replacement 07/02/2013   PCP: Lynnea Ferrier, MD  REFERRING PROVIDER: Lynnea Ferrier, MD  REFERRING DIAG: Lumbar DDD, Neuropathy  Rationale for Evaluation and Treatment: Rehabilitation  THERAPY  DIAG:  Difficulty in walking, not elsewhere classified  Muscle weakness (generalized)  Other low back pain  Imbalance  Unsteadiness on feet  ONSET DATE: 03/20/2019  SUBJECTIVE:                                                                                                                                                                                           SUBJECTIVE STATEMENT: Pt. Reports chronic h/o lumbar stenosis/ back pain.  Pt. Reports 8/10 low back pain first thing in the morning and uses heating pad/ ibuprofen to decrease back pain to 1/10 after about 2 hours.  Pt. Reports no recent falls and using elevated RW for short distance ambulation and w/c for community and some household mobility.  Pt. States she has poor standing/walking endurance and is very slow with movement, which is why she requires use of w/c.  Increase episodes of COPD since last PT tx. Session.  Pt. Has 2 weddings to attend on 10/26 and 11/16.    PERTINENT HISTORY:  See MD notes.    PAIN:  Are you having pain? Yes: NPRS scale: 8/10 Pain location: low back Pain description: aching Aggravating factors: lying supine/ getting out of bed.  Relieving factors: moist heat  PRECAUTIONS: Fall  RED FLAGS: None   WEIGHT BEARING RESTRICTIONS: No  FALLS:  Has patient fallen in last 6 months? No  LIVING ENVIRONMENT: Lives with: lives with their spouse Lives in: House/apartment Stairs: No Has following equipment at home: Environmental consultant - 2 wheeled, Wheelchair (manual), and Tour manager  OCCUPATION: Retired  PLOF: Requires assistive device for independence  PATIENT GOALS: Increase standing/walking endurance.  Decrease low back pain.  NEXT MD VISIT: PRN  OBJECTIVE:  Note: Objective measures were completed at Evaluation unless otherwise noted.  DIAGNOSTIC FINDINGS:  COMPARISON:  Lumbar spine MRI from 06/26/2020   FINDINGS: Normal alignment. The vertebral body heights are well preserved. No signs of acute  fracture or subluxation. Moderate multilevel disc space narrowing and anterior and posterior spur formation.  Facet arthropathy identified within the lower lumbar spine. Aortic atherosclerosis. Moderate stool burden noted within the colon.   IMPRESSION: 1. No acute findings. 2. Moderate multilevel degenerative disc disease and facet arthropathy.  PATIENT SURVEYS:  FOTO initial 40/ goal 4  SCREENING FOR RED FLAGS: Bowel or bladder incontinence: yes Spinal tumors: No Cauda equina syndrome: No Compression fracture: No Abdominal aneurysm: No  COGNITION: Overall cognitive status: Within functional limits for tasks assessed   SENSATION: WFL, moderate lower leg swelling noted.  MUSCLE LENGTH: No supine positioning today Hamstrings: NT Thomas test: NT   POSTURE: rounded shoulders, forward head, increased thoracic kyphosis, and flexed trunk   PALPATION: Tenderness over lumbar paraspinals (generalized).  LUMBAR ROM:   AROM eval  Flexion 50% limited  Extension 100% limited  Right lateral flexion 50% limited  Left lateral flexion 50% limited  Right rotation 50% limited  Left rotation 50% limited   (Blank rows = not tested)  LOWER EXTREMITY ROM:       B hip flexion/ knee extension limited.    LOWER EXTREMITY MMT:       B LE muscle strength grossly 4/5 MMT except hamstring 4+/5 MMT and hip flexion 4-/5 MMT  Ascends steps with L LE and heavy UE assist.   LUMBAR SPECIAL TESTS:  Next tx. Session.  No supine positioning today.  FUNCTIONAL TESTS:  5 times sit to stand: 22.13 sec. With use of armrests.  Pt. Limited with STS from gray chair without UE assist.   6 minute walk test: TBD  GAIT: Distance walked: in clinic with elevated RW Assistive device utilized: Walker - 2 wheeled Level of assistance: Modified independence Comments: Significant forward head/ flexed posture/ thoracic kyphosis.  Slow, controlled gait pattern.  Fear of L knee buckling with R LE swing through  phase of gait.   Walking in //-bars from blue line to blue line (1 lap)-  20.75 seconds/ 18.56 seconds.    TODAY'S TREATMENT:                                                                                                                              DATE: 05/07/2023    Subjective:  Pt. Reports being fatigued today and stressed with dtrs. Upcoming CT scan.  Pt. Reports minimal central low back pain in sitting at this time.  Pt. Went to gym with husband yesterday and did bike/ core machine.       There.ex.:  Nustep L4 at seat position 11 for 10 min. B UE/LE (consistent cadence)- MH to back.    Seated LAQ/ LE marching/ shoulder flexion with posture correction 20x.  Sit to stand from chair/ Nustep with UE assist for safety.   Extra time required.   There.act.:  Standing Blaze pods UE reaching in //-bars/ mirror: Random set up for 3 minutes 1st attempt and 2 minutes for 2nd attempt.    Ambulate 98 feet from gym to outside of clinic with elevated RW  and cuing for consistent recip. Gait pattern.  Pt. Cued to increase recip. Step pattern/ cadence.  Pt. Slows down gait at door thresholds/ carpets.    Posture correction in sitting/standing with mirror feedback. Cuing to correction sh./head posture and prevent lateral lean.    PATIENT EDUCATION:  Education details: Daily walking/ posture Person educated: Patient Education method: Medical illustrator Education comprehension: verbalized understanding and returned demonstration  HOME EXERCISE PROGRAM: See handouts  ASSESSMENT:  CLINICAL IMPRESSION:    Pt. Requires extra time and PT provided SBA for gait to the gym for safety.  PT continued to be challenged with standing functional reaching/ wt. Shifting tasks in //-bars.  Marked improvement in L medial hematoma that is tender with palpation.  Pt. Works hard during PT tx. Session with several seated rest breaks to due fatigue/ low back discomfort.   Pt. Will benefit from skilled PT  services in combination with gym based ex. To improve LE strengthening/ pain-free mobility.   OBJECTIVE IMPAIRMENTS: Abnormal gait, decreased activity tolerance, decreased balance, decreased endurance, decreased mobility, difficulty walking, decreased ROM, decreased strength, hypomobility, increased edema, impaired flexibility, improper body mechanics, postural dysfunction, and pain.   ACTIVITY LIMITATIONS: bending, sitting, standing, squatting, stairs, transfers, bed mobility, toileting, and locomotion level  PARTICIPATION LIMITATIONS: medication management  PERSONAL FACTORS: Fitness and Past/current experiences are also affecting patient's functional outcome.   REHAB POTENTIAL: Good  CLINICAL DECISION MAKING: Evolving/moderate complexity  EVALUATION COMPLEXITY: Moderate   GOALS: Goals reviewed with patient? Yes  SHORT TERM GOALS: Target date: 05/13/23  Pt. Will be independent with HEP to increase B LE muscle strength 1/2 muscle grade to improve transfer/ safety with walking.  Baseline:  see above Goal status: Not met  2.  Pt. Will complete with no rest breaks to improve walking endurance.   Baseline: TBD.  12/5: not completed at this time secondary to increase mid-back pain and inability to walk 6 minutes.    Goal status: On-going   LONG TERM GOALS: Target date: 06/03/23  Pt. Will increase FOTO to 51 to improve pain-free functional mobility.  Baseline:  initial 40 Goal status: On-going  2.  Pt. Able to stand from 21# chair height with no UE assist or low back pain to improve transfers/ independence with daily activity.   Baseline: benefits from armrests/ UE assist.  Goal status: Not met  3.  Pt. Able to return to gym based ex. Program with no limitations or c/o low back pain.   Baseline: currently not exercising or going to gym.  12/5: pt. Returned to gym for a few weeks prior to family issues/ no recent gym visits. Goal status: Partially met   PLAN:   PT  FREQUENCY: 2x/week  PT DURATION: 6 weeks  PLANNED INTERVENTIONS: Therapeutic exercises, Therapeutic activity, Neuromuscular re-education, Balance training, Gait training, Patient/Family education, Self Care, Joint mobilization, DME instructions, Dry Needling, Electrical stimulation, Cryotherapy, Moist heat, and Manual therapy.  PLAN FOR NEXT SESSION: Standing there.ex./ posture correction with mirror feedback.     Cammie Mcgee, PT, DPT # [phone] 2:35 PM,05/07/23

## 2023-05-09 ENCOUNTER — Ambulatory Visit: Payer: Medicare Other | Admitting: Physical Therapy

## 2023-05-09 DIAGNOSIS — R262 Difficulty in walking, not elsewhere classified: Secondary | ICD-10-CM

## 2023-05-09 DIAGNOSIS — M5459 Other low back pain: Secondary | ICD-10-CM

## 2023-05-09 DIAGNOSIS — M6281 Muscle weakness (generalized): Secondary | ICD-10-CM

## 2023-05-09 DIAGNOSIS — R2681 Unsteadiness on feet: Secondary | ICD-10-CM

## 2023-05-09 NOTE — Therapy (Unsigned)
OUTPATIENT PHYSICAL THERAPY THORACOLUMBAR TREATMENT  Patient Name: Hannah Powell MRN: [id number] DOB:07/23/1945, 78 y.o., female Today's Date: 05/09/2023  END OF SESSION:  PT End of Session - 05/09/23 1544     Visit Number 22    Number of Visits 31    Date for PT Re-Evaluation 06/03/23    PT Start Time 1520    PT Stop Time 1615    PT Time Calculation (min) 55 min    Equipment Utilized During Treatment Gait belt    Activity Tolerance Patient tolerated treatment well    Behavior During Therapy WFL for tasks assessed/performed            Past Medical History:  Diagnosis Date   Anxiety    Arthritis    CHF (congestive heart failure) (HCC)    COPD (chronic obstructive pulmonary disease) (HCC)    Dysplastic nevus 04/22/2019   R abdomen parallel to sup umbilicus - mod   Dysplastic nevus 04/22/2019   R low back above waistline - mild    Edema    feet/legs   Hypertension    dr Darryll Capers      Hypothyroidism    Leg weakness, bilateral    Osteoporosis    Oxygen deficit    2l with bipap at night   RLS (restless legs syndrome)    Shortness of breath    Sleep apnea    bipap   Wheezing    Past Surgical History:  Procedure Laterality Date   BACK SURGERY     cervical   BREAST BIOPSY Left    needle bx-neg   CATARACT EXTRACTION W/PHACO Left 05/07/2017   Procedure: CATARACT EXTRACTION PHACO AND INTRAOCULAR LENS PLACEMENT (IOC);  Surgeon: Galen Manila, MD;  Location: [facility];  Service: Ophthalmology;  Laterality: Left;  [place] 00:48.7 AP% 11.3 CDE 5.46 Fluid Pack Lot # [id number]   CATARACT EXTRACTION W/PHACO Right 04/23/2017   Procedure: CATARACT EXTRACTION PHACO AND INTRAOCULAR LENS PLACEMENT (IOC);  Surgeon: Galen Manila, MD;  Location: [facility];  Service: Ophthalmology;  Laterality: Right;  [place] 00:29 AP% 12.8 CDE 3.79 FLUID PACK LOT # [id number]   CESAREAN SECTION     FOOT ARTHROPLASTY     JOINT REPLACEMENT     x2 tkr   TOTAL SHOULDER ARTHROPLASTY Left  07/02/2013   Procedure: LEFT TOTAL SHOULDER ARTHROPLASTY;  Surgeon: Senaida Lange, MD;  Location: [facility];  Service: Orthopedics;  Laterality: Left;   TOTAL SHOULDER ARTHROPLASTY Right 04/10/2018   Procedure: TOTAL REVERSE SHOULDER ARTHROPLASTY;  Surgeon: Francena Hanly, MD;  Location: [facility];  Service: Orthopedics;  Laterality: Right;    Patient Active Problem List   Diagnosis Date Noted   SUI (stress urinary incontinence, female) 05/03/2023   Overactive bladder 05/03/2023   Bilateral leg pain 08/29/2022   Cervical radiculopathy 08/28/2022   CHF exacerbation (HCC) 08/26/2022   Swelling of lower extremity 08/26/2022   Acute on chronic respiratory failure with hypoxia and hypercapnia (HCC) 03/29/2022   Chronic diastolic CHF (congestive heart failure) (HCC) 03/29/2022   Cellulitis of right lower extremity 03/29/2022   Restless leg syndrome 02/08/2022   Hypothyroidism 02/08/2022   Obesity (BMI 30-39.9) 02/08/2022   Insomnia 02/08/2022   Anxiety 02/08/2022   Poor appetite 02/08/2022   S/P reverse total shoulder arthroplasty, 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   Chronic diastolic heart failure (HCC) 08/09/2016   COPD (chronic obstructive pulmonary disease) (HCC) 08/09/2016   Pulmonary infiltrates    Pneumonia due to Streptococcus pneumoniae (HCC)    Fever  Community acquired pneumonia of right lung    Palliative care by specialist    Goals of care, counseling/discussion    Pressure injury of skin 07/03/2016   Acute respiratory failure with hypoxia (HCC) 07/02/2016   Acute on chronic diastolic CHF (congestive heart failure) (HCC) 07/21/2015   COPD exacerbation (HCC) 07/21/2015   Acute renal insufficiency 07/21/2015   Hyperglycemia 07/21/2015   HTN (hypertension) 07/21/2015   S/P shoulder replacement 07/02/2013   PCP: Lynnea Ferrier, MD  REFERRING PROVIDER: Lynnea Ferrier, MD  REFERRING DIAG: Lumbar DDD, Neuropathy  Rationale for Evaluation and Treatment: Rehabilitation  THERAPY  DIAG:  Difficulty in walking, not elsewhere classified  Muscle weakness (generalized)  Other low back pain  Unsteadiness on feet  ONSET DATE: 03/20/2019  SUBJECTIVE:                                                                                                                                                                                           SUBJECTIVE STATEMENT: Pt. Reports chronic h/o lumbar stenosis/ back pain.  Pt. Reports 8/10 low back pain first thing in the morning and uses heating pad/ ibuprofen to decrease back pain to 1/10 after about 2 hours.  Pt. Reports no recent falls and using elevated RW for short distance ambulation and w/c for community and some household mobility.  Pt. States she has poor standing/walking endurance and is very slow with movement, which is why she requires use of w/c.  Increase episodes of COPD since last PT tx. Session.  Pt. Has 2 weddings to attend on 10/26 and 11/16.    PERTINENT HISTORY:  See MD notes.    PAIN:  Are you having pain? Yes: NPRS scale: 8/10 Pain location: low back Pain description: aching Aggravating factors: lying supine/ getting out of bed.  Relieving factors: moist heat  PRECAUTIONS: Fall  RED FLAGS: None   WEIGHT BEARING RESTRICTIONS: No  FALLS:  Has patient fallen in last 6 months? No  LIVING ENVIRONMENT: Lives with: lives with their spouse Lives in: House/apartment Stairs: No Has following equipment at home: Environmental consultant - 2 wheeled, Wheelchair (manual), and Tour manager  OCCUPATION: Retired  PLOF: Requires assistive device for independence  PATIENT GOALS: Increase standing/walking endurance.  Decrease low back pain.  NEXT MD VISIT: PRN  OBJECTIVE:  Note: Objective measures were completed at Evaluation unless otherwise noted.  DIAGNOSTIC FINDINGS:  COMPARISON:  Lumbar spine MRI from 06/26/2020   FINDINGS: Normal alignment. The vertebral body heights are well preserved. No signs of acute fracture or  subluxation. Moderate multilevel disc space narrowing and anterior and posterior spur formation. Facet arthropathy  identified within the lower lumbar spine. Aortic atherosclerosis. Moderate stool burden noted within the colon.   IMPRESSION: 1. No acute findings. 2. Moderate multilevel degenerative disc disease and facet arthropathy.  PATIENT SURVEYS:  FOTO initial 40/ goal 48  SCREENING FOR RED FLAGS: Bowel or bladder incontinence: yes Spinal tumors: No Cauda equina syndrome: No Compression fracture: No Abdominal aneurysm: No  COGNITION: Overall cognitive status: Within functional limits for tasks assessed   SENSATION: WFL, moderate lower leg swelling noted.  MUSCLE LENGTH: No supine positioning today Hamstrings: NT Thomas test: NT   POSTURE: rounded shoulders, forward head, increased thoracic kyphosis, and flexed trunk   PALPATION: Tenderness over lumbar paraspinals (generalized).  LUMBAR ROM:   AROM eval  Flexion 50% limited  Extension 100% limited  Right lateral flexion 50% limited  Left lateral flexion 50% limited  Right rotation 50% limited  Left rotation 50% limited   (Blank rows = not tested)  LOWER EXTREMITY ROM:       B hip flexion/ knee extension limited.    LOWER EXTREMITY MMT:       B LE muscle strength grossly 4/5 MMT except hamstring 4+/5 MMT and hip flexion 4-/5 MMT  Ascends steps with L LE and heavy UE assist.   LUMBAR SPECIAL TESTS:  Next tx. Session.  No supine positioning today.  FUNCTIONAL TESTS:  5 times sit to stand: 22.13 sec. With use of armrests.  Pt. Limited with STS from gray chair without UE assist.   6 minute walk test: TBD  GAIT: Distance walked: in clinic with elevated RW Assistive device utilized: Walker - 2 wheeled Level of assistance: Modified independence Comments: Significant forward head/ flexed posture/ thoracic kyphosis.  Slow, controlled gait pattern.  Fear of L knee buckling with R LE swing through phase of  gait.   Walking in //-bars from blue line to blue line (1 lap)-  20.75 seconds/ 18.56 seconds.    TODAY'S TREATMENT:                                                                                                                              DATE: 05/09/2023    Subjective:  Pt. Reports being fatigued today and stressed with dtrs. Pt. States they have a physical appointment tomorrow with their PCP. Pt. Reports they have an MD appoint ment next Wednesday or Thursday. Pt. Reports moderate central low back pain in sitting at this time. Pt. Explains that they are feeling rather weak today, especially in their legs.   There.ex.:  Nustep L4 at seat position 11 for 10 min. B UE/LE (consistent cadence)- MH to back.    Seated LAQ/ LE marching/ shoulder flexion with posture correction 20x. 4# ankle weights B.  TRX Squat to chair height. B UE assistance. 3x10.   Sit to stand from chair/ Nustep with UE assist for safety.   Extra time required.   There.act.:  Standing Blaze pods UE reaching in //-bars/ mirror: Random  set up for 3 minutes 1st attempt and 2 minutes for 2nd attempt.    Ambulate 98 feet from gym to outside of clinic with elevated RW and cuing for consistent recip. Gait pattern.  Pt. Cued to increase recip. Step pattern/ cadence.  Pt. Slows down gait at door thresholds/ carpets.    Posture correction in sitting/standing with mirror feedback. Cuing to correction sh./head posture and prevent lateral lean.    PATIENT EDUCATION:  Education details: Daily walking/ posture Person educated: Patient Education method: Medical illustrator Education comprehension: verbalized understanding and returned demonstration  HOME EXERCISE PROGRAM: See handouts  ASSESSMENT:  CLINICAL IMPRESSION:    Pt. Requires extra time and PT provided SBA for gait to the gym for safety.  PT continued to be challenged with standing functional reaching/ wt. Shifting tasks in //-bars.  Marked improvement  in L medial hematoma that is tender with palpation.  Pt. Works hard during PT tx. Session with several seated rest breaks to due fatigue/ low back discomfort. Pt. Is continually challenges with standing tolerance and forward reaching tasks. Pt. Expressed having "weak legs" throughout treatment session, and that their L leg has worse pain than their right. Pt. Will benefit from skilled PT services in combination with gym based ex. To improve LE strengthening/ pain-free mobility.   OBJECTIVE IMPAIRMENTS: Abnormal gait, decreased activity tolerance, decreased balance, decreased endurance, decreased mobility, difficulty walking, decreased ROM, decreased strength, hypomobility, increased edema, impaired flexibility, improper body mechanics, postural dysfunction, and pain.   ACTIVITY LIMITATIONS: bending, sitting, standing, squatting, stairs, transfers, bed mobility, toileting, and locomotion level  PARTICIPATION LIMITATIONS: medication management  PERSONAL FACTORS: Fitness and Past/current experiences are also affecting patient's functional outcome.   REHAB POTENTIAL: Good  CLINICAL DECISION MAKING: Evolving/moderate complexity  EVALUATION COMPLEXITY: Moderate   GOALS: Goals reviewed with patient? Yes  SHORT TERM GOALS: Target date: 05/13/23  Pt. Will be independent with HEP to increase B LE muscle strength 1/2 muscle grade to improve transfer/ safety with walking.  Baseline:  see above Goal status: Not met  2.  Pt. Will complete with no rest breaks to improve walking endurance.   Baseline: TBD.  12/5: not completed at this time secondary to increase mid-back pain and inability to walk 6 minutes.    Goal status: On-going   LONG TERM GOALS: Target date: 06/03/23  Pt. Will increase FOTO to 51 to improve pain-free functional mobility.  Baseline:  initial 40 Goal status: On-going  2.  Pt. Able to stand from 21# chair height with no UE assist or low back pain to improve transfers/  independence with daily activity.   Baseline: benefits from armrests/ UE assist.  Goal status: Not met  3.  Pt. Able to return to gym based ex. Program with no limitations or c/o low back pain.   Baseline: currently not exercising or going to gym.  12/5: pt. Returned to gym for a few weeks prior to family issues/ no recent gym visits. Goal status: Partially met   PLAN:   PT FREQUENCY: 2x/week  PT DURATION: 6 weeks  PLANNED INTERVENTIONS: Therapeutic exercises, Therapeutic activity, Neuromuscular re-education, Balance training, Gait training, Patient/Family education, Self Care, Joint mobilization, DME instructions, Dry Needling, Electrical stimulation, Cryotherapy, Moist heat, and Manual therapy.  PLAN FOR NEXT SESSION: Standing there.ex./ posture correction with mirror feedback.     Cammie Mcgee, PT, DPT # [phone] 4:14 PM,05/09/23

## 2023-05-13 ENCOUNTER — Encounter: Payer: Self-pay | Admitting: Physical Therapy

## 2023-05-13 ENCOUNTER — Ambulatory Visit: Payer: Medicare Other | Admitting: Physical Therapy

## 2023-05-13 DIAGNOSIS — R262 Difficulty in walking, not elsewhere classified: Secondary | ICD-10-CM

## 2023-05-13 DIAGNOSIS — R2681 Unsteadiness on feet: Secondary | ICD-10-CM

## 2023-05-13 DIAGNOSIS — R2689 Other abnormalities of gait and mobility: Secondary | ICD-10-CM

## 2023-05-13 DIAGNOSIS — M5459 Other low back pain: Secondary | ICD-10-CM

## 2023-05-13 DIAGNOSIS — M6281 Muscle weakness (generalized): Secondary | ICD-10-CM

## 2023-05-13 NOTE — Therapy (Signed)
 OUTPATIENT PHYSICAL THERAPY THORACOLUMBAR TREATMENT  Patient Name: Hannah Powell MRN: [id number] DOB:12/01/1945, 78 y.o., female Today's Date: 05/13/2023  END OF SESSION:  PT End of Session - 05/13/23 1346     Visit Number 23    Number of Visits 31    Date for PT Re-Evaluation 06/03/23    PT Start Time 1313    PT Stop Time 1402    PT Time Calculation (min) 49 min    Equipment Utilized During Treatment Gait belt    Activity Tolerance Patient tolerated treatment well    Behavior During Therapy WFL for tasks assessed/performed            Past Medical History:  Diagnosis Date   Anxiety    Arthritis    CHF (congestive heart failure) (HCC)    COPD (chronic obstructive pulmonary disease) (HCC)    Dysplastic nevus 04/22/2019   R abdomen parallel to sup umbilicus - mod   Dysplastic nevus 04/22/2019   R low back above waistline - mild    Edema    feet/legs   Hypertension    dr Darryll Capers      Hypothyroidism    Leg weakness, bilateral    Osteoporosis    Oxygen deficit    2l with bipap at night   RLS (restless legs syndrome)    Shortness of breath    Sleep apnea    bipap   Wheezing    Past Surgical History:  Procedure Laterality Date   BACK SURGERY     cervical   BREAST BIOPSY Left    needle bx-neg   CATARACT EXTRACTION W/PHACO Left 05/07/2017   Procedure: CATARACT EXTRACTION PHACO AND INTRAOCULAR LENS PLACEMENT (IOC);  Surgeon: Galen Manila, MD;  Location: [facility];  Service: Ophthalmology;  Laterality: Left;  [place] 00:48.7 AP% 11.3 CDE 5.46 Fluid Pack Lot # [id number]   CATARACT EXTRACTION W/PHACO Right 04/23/2017   Procedure: CATARACT EXTRACTION PHACO AND INTRAOCULAR LENS PLACEMENT (IOC);  Surgeon: Galen Manila, MD;  Location: [facility];  Service: Ophthalmology;  Laterality: Right;  [place] 00:29 AP% 12.8 CDE 3.79 FLUID PACK LOT # [id number]   CESAREAN SECTION     FOOT ARTHROPLASTY     JOINT REPLACEMENT     x2 tkr   TOTAL SHOULDER ARTHROPLASTY Left  07/02/2013   Procedure: LEFT TOTAL SHOULDER ARTHROPLASTY;  Surgeon: Senaida Lange, MD;  Location: [facility];  Service: Orthopedics;  Laterality: Left;   TOTAL SHOULDER ARTHROPLASTY Right 04/10/2018   Procedure: TOTAL REVERSE SHOULDER ARTHROPLASTY;  Surgeon: Francena Hanly, MD;  Location: [facility];  Service: Orthopedics;  Laterality: Right;    Patient Active Problem List   Diagnosis Date Noted   SUI (stress urinary incontinence, female) 05/03/2023   Overactive bladder 05/03/2023   Bilateral leg pain 08/29/2022   Cervical radiculopathy 08/28/2022   CHF exacerbation (HCC) 08/26/2022   Swelling of lower extremity 08/26/2022   Acute on chronic respiratory failure with hypoxia and hypercapnia (HCC) 03/29/2022   Chronic diastolic CHF (congestive heart failure) (HCC) 03/29/2022   Cellulitis of right lower extremity 03/29/2022   Restless leg syndrome 02/08/2022   Hypothyroidism 02/08/2022   Obesity (BMI 30-39.9) 02/08/2022   Insomnia 02/08/2022   Anxiety 02/08/2022   Poor appetite 02/08/2022   S/P reverse total shoulder arthroplasty, 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   Chronic diastolic heart failure (HCC) 08/09/2016   COPD (chronic obstructive pulmonary disease) (HCC) 08/09/2016   Pulmonary infiltrates    Pneumonia due to Streptococcus pneumoniae (HCC)    Fever  Community acquired pneumonia of right lung    Palliative care by specialist    Goals of care, counseling/discussion    Pressure injury of skin 07/03/2016   Acute respiratory failure with hypoxia (HCC) 07/02/2016   Acute on chronic diastolic CHF (congestive heart failure) (HCC) 07/21/2015   COPD exacerbation (HCC) 07/21/2015   Acute renal insufficiency 07/21/2015   Hyperglycemia 07/21/2015   HTN (hypertension) 07/21/2015   S/P shoulder replacement 07/02/2013   PCP: Lynnea Ferrier, MD  REFERRING PROVIDER: Lynnea Ferrier, MD  REFERRING DIAG: Lumbar DDD, Neuropathy  Rationale for Evaluation and Treatment: Rehabilitation  THERAPY  DIAG:  Difficulty in walking, not elsewhere classified  Muscle weakness (generalized)  Other low back pain  Unsteadiness on feet  Imbalance  ONSET DATE: 03/20/2019  SUBJECTIVE:                                                                                                                                                                                           SUBJECTIVE STATEMENT: Pt. Reports chronic h/o lumbar stenosis/ back pain.  Pt. Reports 8/10 low back pain first thing in the morning and uses heating pad/ ibuprofen to decrease back pain to 1/10 after about 2 hours.  Pt. Reports no recent falls and using elevated RW for short distance ambulation and w/c for community and some household mobility.  Pt. States she has poor standing/walking endurance and is very slow with movement, which is why she requires use of w/c.  Increase episodes of COPD since last PT tx. Session.  Pt. Has 2 weddings to attend on 10/26 and 11/16.    PERTINENT HISTORY:  See MD notes.    PAIN:  Are you having pain? Yes: NPRS scale: 8/10 Pain location: low back Pain description: aching Aggravating factors: lying supine/ getting out of bed.  Relieving factors: moist heat  PRECAUTIONS: Fall  RED FLAGS: None   WEIGHT BEARING RESTRICTIONS: No  FALLS:  Has patient fallen in last 6 months? No  LIVING ENVIRONMENT: Lives with: lives with their spouse Lives in: House/apartment Stairs: No Has following equipment at home: Environmental consultant - 2 wheeled, Wheelchair (manual), and Tour manager  OCCUPATION: Retired  PLOF: Requires assistive device for independence  PATIENT GOALS: Increase standing/walking endurance.  Decrease low back pain.  NEXT MD VISIT: PRN  OBJECTIVE:  Note: Objective measures were completed at Evaluation unless otherwise noted.  DIAGNOSTIC FINDINGS:  COMPARISON:  Lumbar spine MRI from 06/26/2020   FINDINGS: Normal alignment. The vertebral body heights are well preserved. No signs of acute  fracture or subluxation. Moderate multilevel disc space narrowing and anterior and posterior spur formation.  Facet arthropathy identified within the lower lumbar spine. Aortic atherosclerosis. Moderate stool burden noted within the colon.   IMPRESSION: 1. No acute findings. 2. Moderate multilevel degenerative disc disease and facet arthropathy.  PATIENT SURVEYS:  FOTO initial 40/ goal 57  SCREENING FOR RED FLAGS: Bowel or bladder incontinence: yes Spinal tumors: No Cauda equina syndrome: No Compression fracture: No Abdominal aneurysm: No  COGNITION: Overall cognitive status: Within functional limits for tasks assessed   SENSATION: WFL, moderate lower leg swelling noted.  MUSCLE LENGTH: No supine positioning today Hamstrings: NT Thomas test: NT   POSTURE: rounded shoulders, forward head, increased thoracic kyphosis, and flexed trunk   PALPATION: Tenderness over lumbar paraspinals (generalized).  LUMBAR ROM:   AROM eval  Flexion 50% limited  Extension 100% limited  Right lateral flexion 50% limited  Left lateral flexion 50% limited  Right rotation 50% limited  Left rotation 50% limited   (Blank rows = not tested)  LOWER EXTREMITY ROM:       B hip flexion/ knee extension limited.    LOWER EXTREMITY MMT:       B LE muscle strength grossly 4/5 MMT except hamstring 4+/5 MMT and hip flexion 4-/5 MMT  Ascends steps with L LE and heavy UE assist.   LUMBAR SPECIAL TESTS:  Next tx. Session.  No supine positioning today.  FUNCTIONAL TESTS:  5 times sit to stand: 22.13 sec. With use of armrests.  Pt. Limited with STS from gray chair without UE assist.   6 minute walk test: TBD  GAIT: Distance walked: in clinic with elevated RW Assistive device utilized: Walker - 2 wheeled Level of assistance: Modified independence Comments: Significant forward head/ flexed posture/ thoracic kyphosis.  Slow, controlled gait pattern.  Fear of L knee buckling with R LE swing through  phase of gait.   Walking in //-bars from blue line to blue line (1 lap)-  20.75 seconds/ 18.56 seconds.    TODAY'S TREATMENT:                                                                                                                              DATE: 05/13/2023    Subjective:  Pt. Reports minimal central low back pain in sitting at this time.  Pt. Discussed mid-thoracic symptoms and pt. States they are trying to focus on upright posture.  No new complaints.    There.ex.:  Nustep L4 at seat position 11 for 10 min. B UE/LE (consistent cadence)- MH to back.    TRX Squat to chair height. B UE assistance. 3x10.   Seated Nautilus:  50# lat. Pull downs with wand (15x2) with sh. Flexion static holds.    Sit to stand from chair/ Nustep with UE assist for safety.   Extra time required.   There.act.:  Ambulate 85 feet from gym to outside of clinic with elevated RW and cuing for consistent recip. Gait pattern.  Pt. Cued to increase recip. Step pattern.  Pt. Slows down gait at door thresholds/ carpets.  Focus on maintaining consistent cadence, esp. Over the door thresholds.     NOT TODAY:  Standing Blaze pods UE reaching in //-bars/ mirror: Random set up for 3 minutes 1st attempt and 2 minutes for 2nd attempt.    Posture correction in sitting/standing with mirror feedback. Cuing to correction sh./head posture and prevent lateral lean.    PATIENT EDUCATION:  Education details: Daily walking/ posture Person educated: Patient Education method: Medical illustrator Education comprehension: verbalized understanding and returned demonstration  HOME EXERCISE PROGRAM: See handouts  ASSESSMENT:  CLINICAL IMPRESSION:    Pt. Requires extra time and PT provided SBA for gait to the gym for safety.  PT continued to be challenged with standing functional reaching/ wt. Shifting tasks in //-bars.  Marked improvement in L medial hematoma that is tender with palpation.  Pt. Works hard  during PT tx. Session with several seated rest breaks to due fatigue/ low back discomfort. Pt. Is continually challenges with standing tolerance and forward reaching tasks. Pt. Expressed having "weak legs" throughout treatment session, and that their L leg has worse pain than their right. Pt. Will benefit from skilled PT services in combination with gym based ex. To improve LE strengthening/ pain-free mobility.   OBJECTIVE IMPAIRMENTS: Abnormal gait, decreased activity tolerance, decreased balance, decreased endurance, decreased mobility, difficulty walking, decreased ROM, decreased strength, hypomobility, increased edema, impaired flexibility, improper body mechanics, postural dysfunction, and pain.   ACTIVITY LIMITATIONS: bending, sitting, standing, squatting, stairs, transfers, bed mobility, toileting, and locomotion level  PARTICIPATION LIMITATIONS: medication management  PERSONAL FACTORS: Fitness and Past/current experiences are also affecting patient's functional outcome.   REHAB POTENTIAL: Good  CLINICAL DECISION MAKING: Evolving/moderate complexity  EVALUATION COMPLEXITY: Moderate   GOALS: Goals reviewed with patient? Yes  SHORT TERM GOALS: Target date: 05/13/23  Pt. Will be independent with HEP to increase B LE muscle strength 1/2 muscle grade to improve transfer/ safety with walking.  Baseline:  see above Goal status: Not met  2.  Pt. Will complete with no rest breaks to improve walking endurance.   Baseline: TBD.  12/5: not completed at this time secondary to increase mid-back pain and inability to walk 6 minutes.    Goal status: On-going   LONG TERM GOALS: Target date: 06/03/23  Pt. Will increase FOTO to 51 to improve pain-free functional mobility.  Baseline:  initial 40 Goal status: On-going  2.  Pt. Able to stand from 21# chair height with no UE assist or low back pain to improve transfers/ independence with daily activity.   Baseline: benefits from armrests/ UE  assist.  Goal status: Not met  3.  Pt. Able to return to gym based ex. Program with no limitations or c/o low back pain.   Baseline: currently not exercising or going to gym.  12/5: pt. Returned to gym for a few weeks prior to family issues/ no recent gym visits. Goal status: Partially met   PLAN:   PT FREQUENCY: 2x/week  PT DURATION: 6 weeks  PLANNED INTERVENTIONS: Therapeutic exercises, Therapeutic activity, Neuromuscular re-education, Balance training, Gait training, Patient/Family education, Self Care, Joint mobilization, DME instructions, Dry Needling, Electrical stimulation, Cryotherapy, Moist heat, and Manual therapy.  PLAN FOR NEXT SESSION: Standing there.ex./ posture correction with mirror feedback.     Cammie Mcgee, PT, DPT # [id number] 3:12 PM,05/13/23  Milford Cage, SPT

## 2023-05-15 ENCOUNTER — Ambulatory Visit: Payer: Medicare Other | Admitting: Physical Therapy

## 2023-05-28 ENCOUNTER — Encounter: Payer: Self-pay | Admitting: Physical Therapy

## 2023-05-28 ENCOUNTER — Ambulatory Visit: Attending: Internal Medicine | Admitting: Physical Therapy

## 2023-05-28 DIAGNOSIS — R2681 Unsteadiness on feet: Secondary | ICD-10-CM | POA: Insufficient documentation

## 2023-05-28 DIAGNOSIS — R262 Difficulty in walking, not elsewhere classified: Secondary | ICD-10-CM | POA: Diagnosis present

## 2023-05-28 DIAGNOSIS — M5459 Other low back pain: Secondary | ICD-10-CM | POA: Diagnosis present

## 2023-05-28 DIAGNOSIS — M6281 Muscle weakness (generalized): Secondary | ICD-10-CM | POA: Diagnosis present

## 2023-05-28 DIAGNOSIS — R2689 Other abnormalities of gait and mobility: Secondary | ICD-10-CM | POA: Insufficient documentation

## 2023-05-28 NOTE — Therapy (Signed)
 OUTPATIENT PHYSICAL THERAPY THORACOLUMBAR TREATMENT  Patient Name: Hannah Powell MRN: [id number] DOB:05/30/1945, 78 y.o., female Today's Date: 05/28/2023  END OF SESSION:  PT End of Session - 05/28/23 1125     Visit Number 24    Number of Visits 31    Date for PT Re-Evaluation 06/03/23    PT Start Time 1118    PT Stop Time 1205    PT Time Calculation (min) 47 min    Equipment Utilized During Treatment Gait belt    Activity Tolerance Patient tolerated treatment well    Behavior During Therapy WFL for tasks assessed/performed            Past Medical History:  Diagnosis Date   Anxiety    Arthritis    CHF (congestive heart failure) (HCC)    COPD (chronic obstructive pulmonary disease) (HCC)    Dysplastic nevus 04/22/2019   R abdomen parallel to sup umbilicus - mod   Dysplastic nevus 04/22/2019   R low back above waistline - mild    Edema    feet/legs   Hypertension    dr Darryll Capers      Hypothyroidism    Leg weakness, bilateral    Osteoporosis    Oxygen deficit    2l with bipap at night   RLS (restless legs syndrome)    Shortness of breath    Sleep apnea    bipap   Wheezing    Past Surgical History:  Procedure Laterality Date   BACK SURGERY     cervical   BREAST BIOPSY Left    needle bx-neg   CATARACT EXTRACTION W/PHACO Left 05/07/2017   Procedure: CATARACT EXTRACTION PHACO AND INTRAOCULAR LENS PLACEMENT (IOC);  Surgeon: Galen Manila, MD;  Location: [facility];  Service: Ophthalmology;  Laterality: Left;  [place] 00:48.7 AP% 11.3 CDE 5.46 Fluid Pack Lot # [id number]   CATARACT EXTRACTION W/PHACO Right 04/23/2017   Procedure: CATARACT EXTRACTION PHACO AND INTRAOCULAR LENS PLACEMENT (IOC);  Surgeon: Galen Manila, MD;  Location: [facility];  Service: Ophthalmology;  Laterality: Right;  [place] 00:29 AP% 12.8 CDE 3.79 FLUID PACK LOT # [id number]   CESAREAN SECTION     FOOT ARTHROPLASTY     JOINT REPLACEMENT     x2 tkr   TOTAL SHOULDER ARTHROPLASTY Left  07/02/2013   Procedure: LEFT TOTAL SHOULDER ARTHROPLASTY;  Surgeon: Senaida Lange, MD;  Location: [facility];  Service: Orthopedics;  Laterality: Left;   TOTAL SHOULDER ARTHROPLASTY Right 04/10/2018   Procedure: TOTAL REVERSE SHOULDER ARTHROPLASTY;  Surgeon: Francena Hanly, MD;  Location: [facility];  Service: Orthopedics;  Laterality: Right;    Patient Active Problem List   Diagnosis Date Noted   SUI (stress urinary incontinence, female) 05/03/2023   Overactive bladder 05/03/2023   Bilateral leg pain 08/29/2022   Cervical radiculopathy 08/28/2022   CHF exacerbation (HCC) 08/26/2022   Swelling of lower extremity 08/26/2022   Acute on chronic respiratory failure with hypoxia and hypercapnia (HCC) 03/29/2022   Chronic diastolic CHF (congestive heart failure) (HCC) 03/29/2022   Cellulitis of right lower extremity 03/29/2022   Restless leg syndrome 02/08/2022   Hypothyroidism 02/08/2022   Obesity (BMI 30-39.9) 02/08/2022   Insomnia 02/08/2022   Anxiety 02/08/2022   Poor appetite 02/08/2022   S/P reverse total shoulder arthroplasty, 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   Chronic diastolic heart failure (HCC) 08/09/2016   COPD (chronic obstructive pulmonary disease) (HCC) 08/09/2016   Pulmonary infiltrates    Pneumonia due to Streptococcus pneumoniae (HCC)    Fever  Community acquired pneumonia of right lung    Palliative care by specialist    Goals of care, counseling/discussion    Pressure injury of skin 07/03/2016   Acute respiratory failure with hypoxia (HCC) 07/02/2016   Acute on chronic diastolic CHF (congestive heart failure) (HCC) 07/21/2015   COPD exacerbation (HCC) 07/21/2015   Acute renal insufficiency 07/21/2015   Hyperglycemia 07/21/2015   HTN (hypertension) 07/21/2015   S/P shoulder replacement 07/02/2013   PCP: Lynnea Ferrier, MD  REFERRING PROVIDER: Lynnea Ferrier, MD  REFERRING DIAG: Lumbar DDD, Neuropathy  Rationale for Evaluation and Treatment: Rehabilitation  THERAPY  DIAG:  Difficulty in walking, not elsewhere classified  Muscle weakness (generalized)  Other low back pain  ONSET DATE: 03/20/2019  SUBJECTIVE:                                                                                                                                                                                           SUBJECTIVE STATEMENT: Pt. Reports chronic h/o lumbar stenosis/ back pain.  Pt. Reports 8/10 low back pain first thing in the morning and uses heating pad/ ibuprofen to decrease back pain to 1/10 after about 2 hours.  Pt. Reports no recent falls and using elevated RW for short distance ambulation and w/c for community and some household mobility.  Pt. States she has poor standing/walking endurance and is very slow with movement, which is why she requires use of w/c.  Increase episodes of COPD since last PT tx. Session.  Pt. Has 2 weddings to attend on 10/26 and 11/16.    PERTINENT HISTORY:  See MD notes.    PAIN:  Are you having pain? Yes: NPRS scale: 8/10 Pain location: low back Pain description: aching Aggravating factors: lying supine/ getting out of bed.  Relieving factors: moist heat  PRECAUTIONS: Fall  RED FLAGS: None   WEIGHT BEARING RESTRICTIONS: No  FALLS:  Has patient fallen in last 6 months? No  LIVING ENVIRONMENT: Lives with: lives with their spouse Lives in: House/apartment Stairs: No Has following equipment at home: Environmental consultant - 2 wheeled, Wheelchair (manual), and Tour manager  OCCUPATION: Retired  PLOF: Requires assistive device for independence  PATIENT GOALS: Increase standing/walking endurance.  Decrease low back pain.  NEXT MD VISIT: PRN  OBJECTIVE:  Note: Objective measures were completed at Evaluation unless otherwise noted.  DIAGNOSTIC FINDINGS:  COMPARISON:  Lumbar spine MRI from 06/26/2020   FINDINGS: Normal alignment. The vertebral body heights are well preserved. No signs of acute fracture or subluxation. Moderate  multilevel disc space narrowing and anterior and posterior spur formation. Facet arthropathy identified within the lower  lumbar spine. Aortic atherosclerosis. Moderate stool burden noted within the colon.   IMPRESSION: 1. No acute findings. 2. Moderate multilevel degenerative disc disease and facet arthropathy.  PATIENT SURVEYS:  FOTO initial 40/ goal 83  SCREENING FOR RED FLAGS: Bowel or bladder incontinence: yes Spinal tumors: No Cauda equina syndrome: No Compression fracture: No Abdominal aneurysm: No  COGNITION: Overall cognitive status: Within functional limits for tasks assessed   SENSATION: WFL, moderate lower leg swelling noted.  MUSCLE LENGTH: No supine positioning today Hamstrings: NT Thomas test: NT   POSTURE: rounded shoulders, forward head, increased thoracic kyphosis, and flexed trunk   PALPATION: Tenderness over lumbar paraspinals (generalized).  LUMBAR ROM:   AROM eval  Flexion 50% limited  Extension 100% limited  Right lateral flexion 50% limited  Left lateral flexion 50% limited  Right rotation 50% limited  Left rotation 50% limited   (Blank rows = not tested)  LOWER EXTREMITY ROM:       B hip flexion/ knee extension limited.    LOWER EXTREMITY MMT:       B LE muscle strength grossly 4/5 MMT except hamstring 4+/5 MMT and hip flexion 4-/5 MMT  Ascends steps with L LE and heavy UE assist.   LUMBAR SPECIAL TESTS:  Next tx. Session.  No supine positioning today.  FUNCTIONAL TESTS:  5 times sit to stand: 22.13 sec. With use of armrests.  Pt. Limited with STS from gray chair without UE assist.   6 minute walk test: TBD  GAIT: Distance walked: in clinic with elevated RW Assistive device utilized: Walker - 2 wheeled Level of assistance: Modified independence Comments: Significant forward head/ flexed posture/ thoracic kyphosis.  Slow, controlled gait pattern.  Fear of L knee buckling with R LE swing through phase of gait.   Walking in  //-bars from blue line to blue line (1 lap)-  20.75 seconds/ 18.56 seconds.    TODAY'S TREATMENT:                                                                                                                              DATE: 05/28/2023    Subjective:  Pt. reports no new complaints and entered PT with slow but consistent recip. Gait pattern while using elevated RW.  No reports of back pain prior to tx. Session.  Pt. Went to gym last Thursday and completed bike ex.     There.ex.:  Nustep L4 at seat position 11 for 10 min. B UE/LE (consistent cadence)- MH to back.    Seated Nautilus: 50# lat. Pull downs with wand (20x2) with sh. Flexion static holds.  MH to back in chair with back support.  Pt. Focused on upright posture/ head position.    TRX Squat to chair height (added Airex pad). B UE assistance. 5x2.    Sit to stand from chair/ Nustep with UE assist for safety.   Extra time required.   There.act.:  Standing 6" step touches with  heel (recip. Pattern)- B UE assist.  20x.  Limited upright posture.    Ambulate 94 feet in PT clinic/ gym to outside of clinic with elevated RW and cuing for consistent recip. Gait pattern.  Pt. Cued to increase recip. Step pattern.  Pt. Slows down gait at door thresholds/ carpets.  Focus on maintaining consistent cadence, esp. Over the door thresholds.  Hip flexion for clearance and improved heel strike.  CGA while stepping down curb with RW assist.  Extra time but safe.     NOT TODAY:  Standing Blaze pods UE reaching in //-bars/ mirror: Random set up for 3 minutes 1st attempt and 2 minutes for 2nd attempt.    Posture correction in sitting/standing with mirror feedback. Cuing to correction sh./head posture and prevent lateral lean.    PATIENT EDUCATION:  Education details: Daily walking/ posture Person educated: Patient Education method: Medical illustrator Education comprehension: verbalized understanding and returned demonstration  HOME  EXERCISE PROGRAM: See handouts  ASSESSMENT:  CLINICAL IMPRESSION:    Pt. Requires extra time and PT provided SBA for gait to the gym for safety.  PT continued to be challenged with standing functional reaching/ wt. Shifting tasks in //-bars.  Pt. Works hard during PT tx. Session with several seated rest breaks to due fatigue/ low back discomfort. Pt. Is continually challenged with standing tolerance and forward reaching tasks. Pt. Reports fatigue at end of tx., esp. During TRX squats.  Pt. will benefit from skilled PT services in combination with gym based ex. To improve LE strengthening/ pain-free mobility.   OBJECTIVE IMPAIRMENTS: Abnormal gait, decreased activity tolerance, decreased balance, decreased endurance, decreased mobility, difficulty walking, decreased ROM, decreased strength, hypomobility, increased edema, impaired flexibility, improper body mechanics, postural dysfunction, and pain.   ACTIVITY LIMITATIONS: bending, sitting, standing, squatting, stairs, transfers, bed mobility, toileting, and locomotion level  PARTICIPATION LIMITATIONS: medication management  PERSONAL FACTORS: Fitness and Past/current experiences are also affecting patient's functional outcome.   REHAB POTENTIAL: Good  CLINICAL DECISION MAKING: Evolving/moderate complexity  EVALUATION COMPLEXITY: Moderate   GOALS: Goals reviewed with patient? Yes  SHORT TERM GOALS: Target date: 05/13/23  Pt. Will be independent with HEP to increase B LE muscle strength 1/2 muscle grade to improve transfer/ safety with walking.  Baseline:  see above Goal status: Not met  2.  Pt. Will complete with no rest breaks to improve walking endurance.   Baseline: TBD.  12/5: not completed at this time secondary to increase mid-back pain and inability to walk 6 minutes.    Goal status: On-going   LONG TERM GOALS: Target date: 06/03/23  Pt. Will increase FOTO to 51 to improve pain-free functional mobility.  Baseline:   initial 40 Goal status: On-going  2.  Pt. Able to stand from 21# chair height with no UE assist or low back pain to improve transfers/ independence with daily activity.   Baseline: benefits from armrests/ UE assist.  Goal status: Not met  3.  Pt. Able to return to gym based ex. Program with no limitations or c/o low back pain.   Baseline: currently not exercising or going to gym.  12/5: pt. Returned to gym for a few weeks prior to family issues/ no recent gym visits. Goal status: Partially met   PLAN:   PT FREQUENCY: 2x/week  PT DURATION: 6 weeks  PLANNED INTERVENTIONS: Therapeutic exercises, Therapeutic activity, Neuromuscular re-education, Balance training, Gait training, Patient/Family education, Self Care, Joint mobilization, DME instructions, Dry Needling, Electrical stimulation, Cryotherapy, Moist heat, and  Manual therapy.  PLAN FOR NEXT SESSION: Attempt at beginning of tx. So pt. Not fatigued.    Cammie Mcgee, PT, DPT # [phone] 1:10 PM,05/28/23

## 2023-06-03 ENCOUNTER — Ambulatory Visit: Admitting: Obstetrics and Gynecology

## 2023-06-04 ENCOUNTER — Ambulatory Visit: Admitting: Physical Therapy

## 2023-06-06 ENCOUNTER — Ambulatory Visit: Admitting: Physical Therapy

## 2023-06-10 ENCOUNTER — Ambulatory Visit: Admitting: Obstetrics and Gynecology

## 2023-06-11 ENCOUNTER — Encounter: Admitting: Physical Therapy

## 2023-06-13 ENCOUNTER — Encounter: Payer: Self-pay | Admitting: Physical Therapy

## 2023-06-13 ENCOUNTER — Ambulatory Visit: Admitting: Physical Therapy

## 2023-06-13 DIAGNOSIS — R262 Difficulty in walking, not elsewhere classified: Secondary | ICD-10-CM

## 2023-06-13 DIAGNOSIS — M5459 Other low back pain: Secondary | ICD-10-CM

## 2023-06-13 DIAGNOSIS — R2689 Other abnormalities of gait and mobility: Secondary | ICD-10-CM

## 2023-06-13 DIAGNOSIS — R2681 Unsteadiness on feet: Secondary | ICD-10-CM

## 2023-06-13 DIAGNOSIS — M6281 Muscle weakness (generalized): Secondary | ICD-10-CM

## 2023-06-13 NOTE — Therapy (Signed)
 OUTPATIENT PHYSICAL THERAPY THORACOLUMBAR TREATMENT/ RECERTIFICATION  Patient Name: Hannah Powell MRN: [id number] DOB:11/03/1945, 78 y.o., female Today's Date: 06/13/2023  END OF SESSION:  PT End of Session - 06/13/23 1118     Visit Number 25    Number of Visits 37    Date for PT Re-Evaluation 07/25/23    PT Start Time 1112    PT Stop Time 1202    PT Time Calculation (min) 50 min            Past Medical History:  Diagnosis Date   Anxiety    Arthritis    CHF (congestive heart failure) (HCC)    COPD (chronic obstructive pulmonary disease) (HCC)    Dysplastic nevus 04/22/2019   R abdomen parallel to sup umbilicus - mod   Dysplastic nevus 04/22/2019   R low back above waistline - mild    Edema    feet/legs   Hypertension    dr Darryll Capers      Hypothyroidism    Leg weakness, bilateral    Osteoporosis    Oxygen deficit    2l with bipap at night   RLS (restless legs syndrome)    Shortness of breath    Sleep apnea    bipap   Wheezing    Past Surgical History:  Procedure Laterality Date   BACK SURGERY     cervical   BREAST BIOPSY Left    needle bx-neg   CATARACT EXTRACTION W/PHACO Left 05/07/2017   Procedure: CATARACT EXTRACTION PHACO AND INTRAOCULAR LENS PLACEMENT (IOC);  Surgeon: Galen Manila, MD;  Location: [facility];  Service: Ophthalmology;  Laterality: Left;  [place] 00:48.7 AP% 11.3 CDE 5.46 Fluid Pack Lot # [id number]   CATARACT EXTRACTION W/PHACO Right 04/23/2017   Procedure: CATARACT EXTRACTION PHACO AND INTRAOCULAR LENS PLACEMENT (IOC);  Surgeon: Galen Manila, MD;  Location: [facility];  Service: Ophthalmology;  Laterality: Right;  [place] 00:29 AP% 12.8 CDE 3.79 FLUID PACK LOT # [id number]   CESAREAN SECTION     FOOT ARTHROPLASTY     JOINT REPLACEMENT     x2 tkr   TOTAL SHOULDER ARTHROPLASTY Left 07/02/2013   Procedure: LEFT TOTAL SHOULDER ARTHROPLASTY;  Surgeon: Senaida Lange, MD;  Location: [facility];  Service: Orthopedics;  Laterality: Left;   TOTAL  SHOULDER ARTHROPLASTY Right 04/10/2018   Procedure: TOTAL REVERSE SHOULDER ARTHROPLASTY;  Surgeon: Francena Hanly, MD;  Location: [facility];  Service: Orthopedics;  Laterality: Right;    Patient Active Problem List   Diagnosis Date Noted   SUI (stress urinary incontinence, female) 05/03/2023   Overactive bladder 05/03/2023   Bilateral leg pain 08/29/2022   Cervical radiculopathy 08/28/2022   CHF exacerbation (HCC) 08/26/2022   Swelling of lower extremity 08/26/2022   Acute on chronic respiratory failure with hypoxia and hypercapnia (HCC) 03/29/2022   Chronic diastolic CHF (congestive heart failure) (HCC) 03/29/2022   Cellulitis of right lower extremity 03/29/2022   Restless leg syndrome 02/08/2022   Hypothyroidism 02/08/2022   Obesity (BMI 30-39.9) 02/08/2022   Insomnia 02/08/2022   Anxiety 02/08/2022   Poor appetite 02/08/2022   S/P reverse total shoulder arthroplasty, 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   Chronic diastolic heart failure (HCC) 08/09/2016   COPD (chronic obstructive pulmonary disease) (HCC) 08/09/2016   Pulmonary infiltrates    Pneumonia due to Streptococcus pneumoniae (HCC)    Fever    Community acquired pneumonia of right lung    Palliative care by specialist    Goals of care, counseling/discussion    Pressure injury  of skin 07/03/2016   Acute respiratory failure with hypoxia (HCC) 07/02/2016   Acute on chronic diastolic CHF (congestive heart failure) (HCC) 07/21/2015   COPD exacerbation (HCC) 07/21/2015   Acute renal insufficiency 07/21/2015   Hyperglycemia 07/21/2015   HTN (hypertension) 07/21/2015   S/P shoulder replacement 07/02/2013   PCP: Lynnea Ferrier, MD  REFERRING PROVIDER: Lynnea Ferrier, MD  REFERRING DIAG: Lumbar DDD, Neuropathy  Rationale for Evaluation and Treatment: Rehabilitation  THERAPY DIAG:  Difficulty in walking, not elsewhere classified  Muscle weakness (generalized)  Other low back pain  Unsteadiness on feet  Imbalance  ONSET  DATE: 03/20/2019  SUBJECTIVE:                                                                                                                                                                                           SUBJECTIVE STATEMENT: Pt. Reports chronic h/o lumbar stenosis/ back pain.  Pt. Reports 8/10 low back pain first thing in the morning and uses heating pad/ ibuprofen to decrease back pain to 1/10 after about 2 hours.  Pt. Reports no recent falls and using elevated RW for short distance ambulation and w/c for community and some household mobility.  Pt. States she has poor standing/walking endurance and is very slow with movement, which is why she requires use of w/c.  Increase episodes of COPD since last PT tx. Session.  Pt. Has 2 weddings to attend on 10/26 and 11/16.    PERTINENT HISTORY:  See MD notes.    PAIN:  Are you having pain? Yes: NPRS scale: 8/10 Pain location: low back Pain description: aching Aggravating factors: lying supine/ getting out of bed.  Relieving factors: moist heat  PRECAUTIONS: Fall  RED FLAGS: None   WEIGHT BEARING RESTRICTIONS: No  FALLS:  Has patient fallen in last 6 months? No  LIVING ENVIRONMENT: Lives with: lives with their spouse Lives in: House/apartment Stairs: No Has following equipment at home: Environmental consultant - 2 wheeled, Wheelchair (manual), and Tour manager  OCCUPATION: Retired  PLOF: Requires assistive device for independence  PATIENT GOALS: Increase standing/walking endurance.  Decrease low back pain.  NEXT MD VISIT: PRN  OBJECTIVE:  Note: Objective measures were completed at Evaluation unless otherwise noted.  DIAGNOSTIC FINDINGS:  COMPARISON:  Lumbar spine MRI from 06/26/2020   FINDINGS: Normal alignment. The vertebral body heights are well preserved. No signs of acute fracture or subluxation. Moderate multilevel disc space narrowing and anterior and posterior spur formation. Facet arthropathy identified within the lower  lumbar spine. Aortic atherosclerosis. Moderate stool burden noted within the colon.   IMPRESSION: 1. No acute findings. 2.  Moderate multilevel degenerative disc disease and facet arthropathy.  PATIENT SURVEYS:  FOTO initial 40/ goal 17  SCREENING FOR RED FLAGS: Bowel or bladder incontinence: yes Spinal tumors: No Cauda equina syndrome: No Compression fracture: No Abdominal aneurysm: No  COGNITION: Overall cognitive status: Within functional limits for tasks assessed   SENSATION: WFL, moderate lower leg swelling noted.  MUSCLE LENGTH: No supine positioning today Hamstrings: NT Thomas test: NT   POSTURE: rounded shoulders, forward head, increased thoracic kyphosis, and flexed trunk   PALPATION: Tenderness over lumbar paraspinals (generalized).  LUMBAR ROM:   AROM eval  Flexion 50% limited  Extension 100% limited  Right lateral flexion 50% limited  Left lateral flexion 50% limited  Right rotation 50% limited  Left rotation 50% limited   (Blank rows = not tested)  LOWER EXTREMITY ROM:       B hip flexion/ knee extension limited.    LOWER EXTREMITY MMT:       B LE muscle strength grossly 4/5 MMT except hamstring 4+/5 MMT and hip flexion 4-/5 MMT  Ascends steps with L LE and heavy UE assist.   LUMBAR SPECIAL TESTS:  Next tx. Session.  No supine positioning today.  FUNCTIONAL TESTS:  5 times sit to stand: 22.13 sec. With use of armrests.  Pt. Limited with STS from gray chair without UE assist.   6 minute walk test: TBD  GAIT: Distance walked: in clinic with elevated RW Assistive device utilized: Walker - 2 wheeled Level of assistance: Modified independence Comments: Significant forward head/ flexed posture/ thoracic kyphosis.  Slow, controlled gait pattern.  Fear of L knee buckling with R LE swing through phase of gait.   Walking in //-bars from blue line to blue line (1 lap)-  20.75 seconds/ 18.56 seconds.    TODAY'S TREATMENT:                                                                                                                               DATE: 06/13/2023    Subjective:  Pt. reports no low back pain but L shoulder is hurting.  Pt. Has been working with lymphedema PT for compression wraps of LE.  Pt. Fit for lymphedema machine for home use.  Pt. Entered PT with slow but consistent recip. Gait pattern while using elevated RW.  Pt. Has not been to gym over past week secondary to pts. Dtr. In hospital.    There.ex.:  Nustep L4 at seat position 11 for 10+ min. B UE/LE (consistent cadence)- MH to back.  0.53 miles.  No rest breaks.    Seated B shoulder flexion/ chest press with wand (feedback for proper posture/ head position)- 20x.    Discussed LE compression stockings during HEP.  There.act.:  Walking in //-bars: forward/ backwards 3 laps.  Requested seated rest break.  Mirror feedback for step pattern/ posture correction.    Ambulate 90 feet in PT clinic/ gym to outside of clinic with elevated  RW and cuing for consistent recip. Gait pattern.  Pt. Cued to increase recip. Step pattern.  Pt. Slows down gait at door thresholds/ carpets.  Focus on maintaining consistent cadence, esp. Over the door thresholds.  Hip flexion for clearance and improved heel strike.  CGA while stepping down curb with RW assist.  Extra time but safe.  Pt. Very fatigued today secondary to limited activity at home and weight of lymphedema wraps on LE.     NOT TODAY:  Standing Blaze pods UE reaching in //-bars/ mirror: Random set up for 3 minutes 1st attempt and 2 minutes for 2nd attempt.    Posture correction in sitting/standing with mirror feedback. Cuing to correction sh./head posture and prevent lateral lean.   Seated Nautilus: 50# lat. Pull downs with wand (20x2) with sh. Flexion static holds.  MH to back in chair with back support.  Pt. Focused on upright posture/ head position.    TRX Squat to chair height (added Airex pad). B UE assistance. 5x2.    Sit  to stand from chair/ Nustep with UE assist for safety.   Extra time required.   Standing 6" step touches with heel (recip. Pattern)- B UE assist.  20x.  Limited upright posture.     PATIENT EDUCATION:  Education details: Daily walking/ posture Person educated: Patient Education method: Medical illustrator Education comprehension: verbalized understanding and returned demonstration  HOME EXERCISE PROGRAM: See handouts  ASSESSMENT:  CLINICAL IMPRESSION:    Pt. Requires extra time and PT provided SBA for gait to the gym for safety.  PT continued to be challenged with standing functional reaching/ wt. Shifting tasks in //-bars.  Pt. Works hard during PT tx. Session with several seated rest breaks to due fatigue. Pt. Is continually challenged with standing tolerance and forward reaching tasks. Pt. Reports fatigue at end of tx. Limiting progression of there.ex./ goal reassessment.  Pt. will benefit from skilled PT services in combination with gym based ex. To improve LE strengthening/ pain-free mobility.   OBJECTIVE IMPAIRMENTS: Abnormal gait, decreased activity tolerance, decreased balance, decreased endurance, decreased mobility, difficulty walking, decreased ROM, decreased strength, hypomobility, increased edema, impaired flexibility, improper body mechanics, postural dysfunction, and pain.   ACTIVITY LIMITATIONS: bending, sitting, standing, squatting, stairs, transfers, bed mobility, toileting, and locomotion level  PARTICIPATION LIMITATIONS: medication management  PERSONAL FACTORS: Fitness and Past/current experiences are also affecting patient's functional outcome.   REHAB POTENTIAL: Good  CLINICAL DECISION MAKING: Evolving/moderate complexity  EVALUATION COMPLEXITY: Moderate   GOALS: Goals reviewed with patient? Yes  SHORT TERM GOALS: Target date: 07/04/23  Pt. Will be independent with HEP to increase B LE muscle strength 1/2 muscle grade to improve transfer/ safety  with walking.  Baseline:  see above Goal status: Not met  2.  Pt. Will complete with no rest breaks to improve walking endurance.   Baseline: TBD.  12/5: not completed at this time secondary to increase mid-back pain and inability to walk 6 minutes.    Goal status: On-going   LONG TERM GOALS: Target date: 07/25/23  Pt. Will increase LEFS score by 10 points to improve pain-free functional mobility.  Baseline:  LEFS: TBD Goal status: Initial  2.  Pt. Able to stand from 21# chair height with no UE assist or low back pain to improve transfers/ independence with daily activity.   Baseline: benefits from armrests/ UE assist.  Goal status: Not met  3.  Pt. Able to return to gym based ex. Program with no limitations or  c/o low back pain.   Baseline: currently not exercising or going to gym.  12/5: pt. Returned to gym for a few weeks prior to family issues/ no recent gym visits. Goal status: Partially met   PLAN:   PT FREQUENCY: 2x/week  PT DURATION: 6 weeks  PLANNED INTERVENTIONS: Therapeutic exercises, Therapeutic activity, Neuromuscular re-education, Balance training, Gait training, Patient/Family education, Self Care, Joint mobilization, DME instructions, Dry Needling, Electrical stimulation, Cryotherapy, Moist heat, and Manual therapy.  PLAN FOR NEXT SESSION: Attempt at beginning of tx. So pt. Not fatigued. Complete LEFS   Cammie Mcgee, PT, DPT # [phone] 12:40 PM,06/13/23

## 2023-06-17 ENCOUNTER — Ambulatory Visit: Admitting: Obstetrics and Gynecology

## 2023-06-18 ENCOUNTER — Ambulatory Visit: Attending: Internal Medicine | Admitting: Physical Therapy

## 2023-06-18 DIAGNOSIS — R262 Difficulty in walking, not elsewhere classified: Secondary | ICD-10-CM | POA: Insufficient documentation

## 2023-06-18 DIAGNOSIS — R2681 Unsteadiness on feet: Secondary | ICD-10-CM | POA: Insufficient documentation

## 2023-06-18 DIAGNOSIS — R2689 Other abnormalities of gait and mobility: Secondary | ICD-10-CM | POA: Insufficient documentation

## 2023-06-18 DIAGNOSIS — R269 Unspecified abnormalities of gait and mobility: Secondary | ICD-10-CM | POA: Insufficient documentation

## 2023-06-18 DIAGNOSIS — M5459 Other low back pain: Secondary | ICD-10-CM | POA: Insufficient documentation

## 2023-06-18 DIAGNOSIS — M6281 Muscle weakness (generalized): Secondary | ICD-10-CM | POA: Insufficient documentation

## 2023-06-18 DIAGNOSIS — R293 Abnormal posture: Secondary | ICD-10-CM | POA: Insufficient documentation

## 2023-06-20 ENCOUNTER — Ambulatory Visit: Admitting: Physical Therapy

## 2023-06-20 DIAGNOSIS — R262 Difficulty in walking, not elsewhere classified: Secondary | ICD-10-CM | POA: Diagnosis present

## 2023-06-20 DIAGNOSIS — R2681 Unsteadiness on feet: Secondary | ICD-10-CM | POA: Diagnosis present

## 2023-06-20 DIAGNOSIS — M5459 Other low back pain: Secondary | ICD-10-CM

## 2023-06-20 DIAGNOSIS — R293 Abnormal posture: Secondary | ICD-10-CM

## 2023-06-20 DIAGNOSIS — M6281 Muscle weakness (generalized): Secondary | ICD-10-CM

## 2023-06-20 DIAGNOSIS — R269 Unspecified abnormalities of gait and mobility: Secondary | ICD-10-CM

## 2023-06-20 DIAGNOSIS — R2689 Other abnormalities of gait and mobility: Secondary | ICD-10-CM | POA: Diagnosis present

## 2023-06-20 NOTE — Therapy (Signed)
 OUTPATIENT PHYSICAL THERAPY THORACOLUMBAR TREATMENT  Patient Name: Hannah Powell MRN: [id number] DOB:04-13-1945, 78 y.o., female Today's Date: 06/20/2023  END OF SESSION:  PT End of Session - 06/20/23 1138     Visit Number 26    Number of Visits 37    Date for PT Re-Evaluation 07/25/23    PT Start Time 1120    PT Stop Time 1206    PT Time Calculation (min) 46 min            Past Medical History:  Diagnosis Date   Anxiety    Arthritis    CHF (congestive heart failure) (HCC)    COPD (chronic obstructive pulmonary disease) (HCC)    Dysplastic nevus 04/22/2019   R abdomen parallel to sup umbilicus - mod   Dysplastic nevus 04/22/2019   R low back above waistline - mild    Edema    feet/legs   Hypertension    dr Darryll Capers      Hypothyroidism    Leg weakness, bilateral    Osteoporosis    Oxygen deficit    2l with bipap at night   RLS (restless legs syndrome)    Shortness of breath    Sleep apnea    bipap   Wheezing    Past Surgical History:  Procedure Laterality Date   BACK SURGERY     cervical   BREAST BIOPSY Left    needle bx-neg   CATARACT EXTRACTION W/PHACO Left 05/07/2017   Procedure: CATARACT EXTRACTION PHACO AND INTRAOCULAR LENS PLACEMENT (IOC);  Surgeon: Galen Manila, MD;  Location: [facility];  Service: Ophthalmology;  Laterality: Left;  [place] 00:48.7 AP% 11.3 CDE 5.46 Fluid Pack Lot # [id number]   CATARACT EXTRACTION W/PHACO Right 04/23/2017   Procedure: CATARACT EXTRACTION PHACO AND INTRAOCULAR LENS PLACEMENT (IOC);  Surgeon: Galen Manila, MD;  Location: [facility];  Service: Ophthalmology;  Laterality: Right;  [place] 00:29 AP% 12.8 CDE 3.79 FLUID PACK LOT # [id number]   CESAREAN SECTION     FOOT ARTHROPLASTY     JOINT REPLACEMENT     x2 tkr   TOTAL SHOULDER ARTHROPLASTY Left 07/02/2013   Procedure: LEFT TOTAL SHOULDER ARTHROPLASTY;  Surgeon: Senaida Lange, MD;  Location: [facility];  Service: Orthopedics;  Laterality: Left;   TOTAL SHOULDER  ARTHROPLASTY Right 04/10/2018   Procedure: TOTAL REVERSE SHOULDER ARTHROPLASTY;  Surgeon: Francena Hanly, MD;  Location: [facility];  Service: Orthopedics;  Laterality: Right;    Patient Active Problem List   Diagnosis Date Noted   SUI (stress urinary incontinence, female) 05/03/2023   Overactive bladder 05/03/2023   Bilateral leg pain 08/29/2022   Cervical radiculopathy 08/28/2022   CHF exacerbation (HCC) 08/26/2022   Swelling of lower extremity 08/26/2022   Acute on chronic respiratory failure with hypoxia and hypercapnia (HCC) 03/29/2022   Chronic diastolic CHF (congestive heart failure) (HCC) 03/29/2022   Cellulitis of right lower extremity 03/29/2022   Restless leg syndrome 02/08/2022   Hypothyroidism 02/08/2022   Obesity (BMI 30-39.9) 02/08/2022   Insomnia 02/08/2022   Anxiety 02/08/2022   Poor appetite 02/08/2022   S/P reverse total shoulder arthroplasty, 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   Chronic diastolic heart failure (HCC) 08/09/2016   COPD (chronic obstructive pulmonary disease) (HCC) 08/09/2016   Pulmonary infiltrates    Pneumonia due to Streptococcus pneumoniae (HCC)    Fever    Community acquired pneumonia of right lung    Palliative care by specialist    Goals of care, counseling/discussion    Pressure injury of  skin 07/03/2016   Acute respiratory failure with hypoxia (HCC) 07/02/2016   Acute on chronic diastolic CHF (congestive heart failure) (HCC) 07/21/2015   COPD exacerbation (HCC) 07/21/2015   Acute renal insufficiency 07/21/2015   Hyperglycemia 07/21/2015   HTN (hypertension) 07/21/2015   S/P shoulder replacement 07/02/2013   PCP: Lynnea Ferrier, MD  REFERRING PROVIDER: Lynnea Ferrier, MD  REFERRING DIAG: Lumbar DDD, Neuropathy  Rationale for Evaluation and Treatment: Rehabilitation  THERAPY DIAG:  Difficulty in walking, not elsewhere classified  Muscle weakness (generalized)  Other low back pain  Unsteadiness on feet  Imbalance  Gait  difficulty  Abnormal posture  ONSET DATE: 03/20/2019  SUBJECTIVE:                                                                                                                                                                                           SUBJECTIVE STATEMENT: Pt. Reports chronic h/o lumbar stenosis/ back pain.  Pt. Reports 8/10 low back pain first thing in the morning and uses heating pad/ ibuprofen to decrease back pain to 1/10 after about 2 hours.  Pt. Reports no recent falls and using elevated RW for short distance ambulation and w/c for community and some household mobility.  Pt. States she has poor standing/walking endurance and is very slow with movement, which is why she requires use of w/c.  Increase episodes of COPD since last PT tx. Session.  Pt. Has 2 weddings to attend on 10/26 and 11/16.    PERTINENT HISTORY:  See MD notes.    PAIN:  Are you having pain? Yes: NPRS scale: 8/10 Pain location: low back Pain description: aching Aggravating factors: lying supine/ getting out of bed.  Relieving factors: moist heat  PRECAUTIONS: Fall  RED FLAGS: None   WEIGHT BEARING RESTRICTIONS: No  FALLS:  Has patient fallen in last 6 months? No  LIVING ENVIRONMENT: Lives with: lives with their spouse Lives in: House/apartment Stairs: No Has following equipment at home: Environmental consultant - 2 wheeled, Wheelchair (manual), and Tour manager  OCCUPATION: Retired  PLOF: Requires assistive device for independence  PATIENT GOALS: Increase standing/walking endurance.  Decrease low back pain.  NEXT MD VISIT: PRN  OBJECTIVE:  Note: Objective measures were completed at Evaluation unless otherwise noted.  DIAGNOSTIC FINDINGS:  COMPARISON:  Lumbar spine MRI from 06/26/2020   FINDINGS: Normal alignment. The vertebral body heights are well preserved. No signs of acute fracture or subluxation. Moderate multilevel disc space narrowing and anterior and posterior spur formation.  Facet arthropathy identified within the lower lumbar spine. Aortic atherosclerosis. Moderate stool burden noted within the colon.   IMPRESSION:  1. No acute findings. 2. Moderate multilevel degenerative disc disease and facet arthropathy.  PATIENT SURVEYS:  FOTO initial 40/ goal 73  SCREENING FOR RED FLAGS: Bowel or bladder incontinence: yes Spinal tumors: No Cauda equina syndrome: No Compression fracture: No Abdominal aneurysm: No  COGNITION: Overall cognitive status: Within functional limits for tasks assessed   SENSATION: WFL, moderate lower leg swelling noted.  MUSCLE LENGTH: No supine positioning today Hamstrings: NT Thomas test: NT   POSTURE: rounded shoulders, forward head, increased thoracic kyphosis, and flexed trunk   PALPATION: Tenderness over lumbar paraspinals (generalized).  LUMBAR ROM:   AROM eval  Flexion 50% limited  Extension 100% limited  Right lateral flexion 50% limited  Left lateral flexion 50% limited  Right rotation 50% limited  Left rotation 50% limited   (Blank rows = not tested)  LOWER EXTREMITY ROM:       B hip flexion/ knee extension limited.    LOWER EXTREMITY MMT:       B LE muscle strength grossly 4/5 MMT except hamstring 4+/5 MMT and hip flexion 4-/5 MMT  Ascends steps with L LE and heavy UE assist.   LUMBAR SPECIAL TESTS:  Next tx. Session.  No supine positioning today.  FUNCTIONAL TESTS:  5 times sit to stand: 22.13 sec. With use of armrests.  Pt. Limited with STS from gray chair without UE assist.   6 minute walk test: TBD  GAIT: Distance walked: in clinic with elevated RW Assistive device utilized: Walker - 2 wheeled Level of assistance: Modified independence Comments: Significant forward head/ flexed posture/ thoracic kyphosis.  Slow, controlled gait pattern.  Fear of L knee buckling with R LE swing through phase of gait.   Walking in //-bars from blue line to blue line (1 lap)-  20.75 seconds/ 18.56 seconds.     TODAY'S TREATMENT:                                                                                                                              DATE: 06/20/2023    Subjective:  Pt. reports no low back pain and L shoulder is still hurting.  Pt. lymphedema continues to be managed by therapist and waiting on compression machine for home.  Pt. Entered PT with slow but consistent recip. Gait pattern while using elevated RW.  Pt. Has not been to gym over past week and PT encouraged pt. To return to gym.     There.ex.:  Nustep L4 at seat position 11 for 10+ min. B UE/LE (consistent cadence)- MH to back.  No rest breaks.    Reassessment of L shoulder ROM (A/AROM).    Discussed LE compression stockings during HEP.  There.act.:  Walking in //-bars: forward/ backwards 3 laps.  Requested seated rest break.  Mirror feedback for step pattern/ posture correction.    Ambulate 90 feet in PT clinic/ gym to outside of clinic with elevated RW and cuing for consistent recip. Gait pattern.  Pt. Cued to increase recip. Step pattern.  Pt. Slows down gait at door thresholds/ carpets.  Focus on maintaining consistent cadence, esp. Over the door thresholds.  Hip flexion for clearance and improved heel strike.    TRX squats 5x/ 6x with rest break in between. Good wt. Bearing on B LE and cuing to correct posture/ head position.  No Airex pad to add height today.    NOT TODAY:  Standing Blaze pods UE reaching in //-bars/ mirror: Random set up for 3 minutes 1st attempt and 2 minutes for 2nd attempt.    Posture correction in sitting/standing with mirror feedback. Cuing to correction sh./head posture and prevent lateral lean.   Seated Nautilus: 50# lat. Pull downs with wand (20x2) with sh. Flexion static holds.  MH to back in chair with back support.  Pt. Focused on upright posture/ head position.    Sit to stand from chair/ Nustep with UE assist for safety.   Extra time required.   Standing 6" step touches with  heel (recip. Pattern)- B UE assist.  20x.  Limited upright posture.     PATIENT EDUCATION:  Education details: Daily walking/ posture Person educated: Patient Education method: Medical illustrator Education comprehension: verbalized understanding and returned demonstration  HOME EXERCISE PROGRAM: See handouts  ASSESSMENT:  CLINICAL IMPRESSION:    Pt. Requires extra time and PT provided SBA for gait to the gym for safety.  PT continued to be challenged with marching in //-bars/ trust of knee.  Pt. Works hard during PT tx. Session with several seated rest breaks to due fatigue. Pt. Is continually challenged with standing there.ex./ TRX squats.  Pt. Reports fatigue at end of tx. Limiting progression of there.ex./ goal reassessment.  Pt. will benefit from skilled PT services in combination with gym based ex. To improve LE strengthening/ pain-free mobility.   OBJECTIVE IMPAIRMENTS: Abnormal gait, decreased activity tolerance, decreased balance, decreased endurance, decreased mobility, difficulty walking, decreased ROM, decreased strength, hypomobility, increased edema, impaired flexibility, improper body mechanics, postural dysfunction, and pain.   ACTIVITY LIMITATIONS: bending, sitting, standing, squatting, stairs, transfers, bed mobility, toileting, and locomotion level  PARTICIPATION LIMITATIONS: medication management  PERSONAL FACTORS: Fitness and Past/current experiences are also affecting patient's functional outcome.   REHAB POTENTIAL: Good  CLINICAL DECISION MAKING: Evolving/moderate complexity  EVALUATION COMPLEXITY: Moderate   GOALS: Goals reviewed with patient? Yes  SHORT TERM GOALS: Target date: 07/04/23  Pt. Will be independent with HEP to increase B LE muscle strength 1/2 muscle grade to improve transfer/ safety with walking.  Baseline:  see above Goal status: Not met  2.  Pt. Will complete with no rest breaks to improve walking endurance.   Baseline:  TBD.  12/5: not completed at this time secondary to increase mid-back pain and inability to walk 6 minutes.    Goal status: On-going   LONG TERM GOALS: Target date: 07/25/23  Pt. Will increase LEFS score by 10 points to improve pain-free functional mobility.  Baseline:  LEFS: TBD Goal status: Initial  2.  Pt. Able to stand from 21# chair height with no UE assist or low back pain to improve transfers/ independence with daily activity.   Baseline: benefits from armrests/ UE assist.  Goal status: Not met  3.  Pt. Able to return to gym based ex. Program with no limitations or c/o low back pain.   Baseline: currently not exercising or going to gym.  12/5: pt. Returned to gym for a few weeks prior to family  issues/ no recent gym visits. Goal status: Partially met   PLAN:   PT FREQUENCY: 2x/week  PT DURATION: 6 weeks  PLANNED INTERVENTIONS: Therapeutic exercises, Therapeutic activity, Neuromuscular re-education, Balance training, Gait training, Patient/Family education, Self Care, Joint mobilization, DME instructions, Dry Needling, Electrical stimulation, Cryotherapy, Moist heat, and Manual therapy.  PLAN FOR NEXT SESSION: Attempt at beginning of tx. So pt. Not fatigued. Complete LEFS   Cammie Mcgee, PT, DPT # [phone] 12:43 PM,06/20/23

## 2023-06-24 ENCOUNTER — Ambulatory Visit: Admitting: Obstetrics and Gynecology

## 2023-06-25 ENCOUNTER — Encounter: Payer: Self-pay | Admitting: Physical Therapy

## 2023-06-25 ENCOUNTER — Ambulatory Visit: Admitting: Physical Therapy

## 2023-06-25 DIAGNOSIS — R262 Difficulty in walking, not elsewhere classified: Secondary | ICD-10-CM

## 2023-06-25 DIAGNOSIS — M5459 Other low back pain: Secondary | ICD-10-CM

## 2023-06-25 DIAGNOSIS — M6281 Muscle weakness (generalized): Secondary | ICD-10-CM

## 2023-06-25 DIAGNOSIS — R2689 Other abnormalities of gait and mobility: Secondary | ICD-10-CM

## 2023-06-25 DIAGNOSIS — R2681 Unsteadiness on feet: Secondary | ICD-10-CM

## 2023-06-25 NOTE — Therapy (Signed)
 OUTPATIENT PHYSICAL THERAPY THORACOLUMBAR TREATMENT  Patient Name: Hannah Powell MRN: [id number] DOB:05/08/1945, 78 y.o., female Today's Date: 06/26/2023  END OF SESSION:  PT End of Session - 06/25/23 1110     Visit Number 27    Number of Visits 37    Date for PT Re-Evaluation 07/25/23    PT Start Time 1100    PT Stop Time 1158    PT Time Calculation (min) 58 min            Past Medical History:  Diagnosis Date   Anxiety    Arthritis    CHF (congestive heart failure) (HCC)    COPD (chronic obstructive pulmonary disease) (HCC)    Dysplastic nevus 04/22/2019   R abdomen parallel to sup umbilicus - mod   Dysplastic nevus 04/22/2019   R low back above waistline - mild    Edema    feet/legs   Hypertension    dr Darryll Capers      Hypothyroidism    Leg weakness, bilateral    Osteoporosis    Oxygen deficit    2l with bipap at night   RLS (restless legs syndrome)    Shortness of breath    Sleep apnea    bipap   Wheezing    Past Surgical History:  Procedure Laterality Date   BACK SURGERY     cervical   BREAST BIOPSY Left    needle bx-neg   CATARACT EXTRACTION W/PHACO Left 05/07/2017   Procedure: CATARACT EXTRACTION PHACO AND INTRAOCULAR LENS PLACEMENT (IOC);  Surgeon: Galen Manila, MD;  Location: [facility];  Service: Ophthalmology;  Laterality: Left;  [place] 00:48.7 AP% 11.3 CDE 5.46 Fluid Pack Lot # [id number]   CATARACT EXTRACTION W/PHACO Right 04/23/2017   Procedure: CATARACT EXTRACTION PHACO AND INTRAOCULAR LENS PLACEMENT (IOC);  Surgeon: Galen Manila, MD;  Location: [facility];  Service: Ophthalmology;  Laterality: Right;  [place] 00:29 AP% 12.8 CDE 3.79 FLUID PACK LOT # [id number]   CESAREAN SECTION     FOOT ARTHROPLASTY     JOINT REPLACEMENT     x2 tkr   TOTAL SHOULDER ARTHROPLASTY Left 07/02/2013   Procedure: LEFT TOTAL SHOULDER ARTHROPLASTY;  Surgeon: Senaida Lange, MD;  Location: [facility];  Service: Orthopedics;  Laterality: Left;   TOTAL SHOULDER  ARTHROPLASTY Right 04/10/2018   Procedure: TOTAL REVERSE SHOULDER ARTHROPLASTY;  Surgeon: Francena Hanly, MD;  Location: [facility];  Service: Orthopedics;  Laterality: Right;    Patient Active Problem List   Diagnosis Date Noted   SUI (stress urinary incontinence, female) 05/03/2023   Overactive bladder 05/03/2023   Bilateral leg pain 08/29/2022   Cervical radiculopathy 08/28/2022   CHF exacerbation (HCC) 08/26/2022   Swelling of lower extremity 08/26/2022   Acute on chronic respiratory failure with hypoxia and hypercapnia (HCC) 03/29/2022   Chronic diastolic CHF (congestive heart failure) (HCC) 03/29/2022   Cellulitis of right lower extremity 03/29/2022   Restless leg syndrome 02/08/2022   Hypothyroidism 02/08/2022   Obesity (BMI 30-39.9) 02/08/2022   Insomnia 02/08/2022   Anxiety 02/08/2022   Poor appetite 02/08/2022   S/P reverse total shoulder arthroplasty, 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   Chronic diastolic heart failure (HCC) 08/09/2016   COPD (chronic obstructive pulmonary disease) (HCC) 08/09/2016   Pulmonary infiltrates    Pneumonia due to Streptococcus pneumoniae (HCC)    Fever    Community acquired pneumonia of right lung    Palliative care by specialist    Goals of care, counseling/discussion    Pressure injury of  skin 07/03/2016   Acute respiratory failure with hypoxia (HCC) 07/02/2016   Acute on chronic diastolic CHF (congestive heart failure) (HCC) 07/21/2015   COPD exacerbation (HCC) 07/21/2015   Acute renal insufficiency 07/21/2015   Hyperglycemia 07/21/2015   HTN (hypertension) 07/21/2015   S/P shoulder replacement 07/02/2013   PCP: Lynnea Ferrier, MD  REFERRING PROVIDER: Lynnea Ferrier, MD  REFERRING DIAG: Lumbar DDD, Neuropathy  Rationale for Evaluation and Treatment: Rehabilitation  THERAPY DIAG:  Difficulty in walking, not elsewhere classified  Muscle weakness (generalized)  Other low back pain  Unsteadiness on feet  Imbalance  ONSET DATE:  03/20/2019  SUBJECTIVE:                                                                                                                                                                                           SUBJECTIVE STATEMENT: Pt. Reports chronic h/o lumbar stenosis/ back pain.  Pt. Reports 8/10 low back pain first thing in the morning and uses heating pad/ ibuprofen to decrease back pain to 1/10 after about 2 hours.  Pt. Reports no recent falls and using elevated RW for short distance ambulation and w/c for community and some household mobility.  Pt. States she has poor standing/walking endurance and is very slow with movement, which is why she requires use of w/c.  Increase episodes of COPD since last PT tx. Session.  Pt. Has 2 weddings to attend on 10/26 and 11/16.    PERTINENT HISTORY:  See MD notes.    PAIN:  Are you having pain? Yes: NPRS scale: 8/10 Pain location: low back Pain description: aching Aggravating factors: lying supine/ getting out of bed.  Relieving factors: moist heat  PRECAUTIONS: Fall  RED FLAGS: None   WEIGHT BEARING RESTRICTIONS: No  FALLS:  Has patient fallen in last 6 months? No  LIVING ENVIRONMENT: Lives with: lives with their spouse Lives in: House/apartment Stairs: No Has following equipment at home: Environmental consultant - 2 wheeled, Wheelchair (manual), and Tour manager  OCCUPATION: Retired  PLOF: Requires assistive device for independence  PATIENT GOALS: Increase standing/walking endurance.  Decrease low back pain.  NEXT MD VISIT: PRN  OBJECTIVE:  Note: Objective measures were completed at Evaluation unless otherwise noted.  DIAGNOSTIC FINDINGS:  COMPARISON:  Lumbar spine MRI from 06/26/2020   FINDINGS: Normal alignment. The vertebral body heights are well preserved. No signs of acute fracture or subluxation. Moderate multilevel disc space narrowing and anterior and posterior spur formation. Facet arthropathy identified within the lower lumbar  spine. Aortic atherosclerosis. Moderate stool burden noted within the colon.   IMPRESSION: 1. No acute findings. 2. Moderate  multilevel degenerative disc disease and facet arthropathy.  PATIENT SURVEYS:  FOTO initial 40/ goal 78  SCREENING FOR RED FLAGS: Bowel or bladder incontinence: yes Spinal tumors: No Cauda equina syndrome: No Compression fracture: No Abdominal aneurysm: No  COGNITION: Overall cognitive status: Within functional limits for tasks assessed   SENSATION: WFL, moderate lower leg swelling noted.  MUSCLE LENGTH: No supine positioning today Hamstrings: NT Thomas test: NT   POSTURE: rounded shoulders, forward head, increased thoracic kyphosis, and flexed trunk   PALPATION: Tenderness over lumbar paraspinals (generalized).  LUMBAR ROM:   AROM eval  Flexion 50% limited  Extension 100% limited  Right lateral flexion 50% limited  Left lateral flexion 50% limited  Right rotation 50% limited  Left rotation 50% limited   (Blank rows = not tested)  LOWER EXTREMITY ROM:       B hip flexion/ knee extension limited.    LOWER EXTREMITY MMT:       B LE muscle strength grossly 4/5 MMT except hamstring 4+/5 MMT and hip flexion 4-/5 MMT  Ascends steps with L LE and heavy UE assist.   LUMBAR SPECIAL TESTS:  Next tx. Session.  No supine positioning today.  FUNCTIONAL TESTS:  5 times sit to stand: 22.13 sec. With use of armrests.  Pt. Limited with STS from gray chair without UE assist.   6 minute walk test: TBD  GAIT: Distance walked: in clinic with elevated RW Assistive device utilized: Walker - 2 wheeled Level of assistance: Modified independence Comments: Significant forward head/ flexed posture/ thoracic kyphosis.  Slow, controlled gait pattern.  Fear of L knee buckling with R LE swing through phase of gait.   Walking in //-bars from blue line to blue line (1 lap)-  20.75 seconds/ 18.56 seconds.    TODAY'S TREATMENT:                                                                                                                               DATE: 06/26/2023    Subjective:  Pt. reports no low back pain and L shoulder is still hurting.  Pt. lymphedema continues to be managed by therapist and waiting on compression machine for home.  Pt. Entered PT with slow but consistent recip. Gait pattern while using elevated RW.  Pt. Went to gym on Saturday with no issues and is planning to go tomorrow and Friday.      There.ex.:  Nustep L4 at seat position 12 for 10+ min. B UE/LE (consistent cadence)- MH to back.  No rest breaks.    Seated Nautilus: lat. Pull downs with shoulder flexion stretches 20x2 (50#).    Seated marching/ LAQ 20x.    Standing mini-squats at //-bars.    Discussed LE compression stockings during HEP.  There.act.:  Walking in //-bars: forward/ backwards 2 laps.  Requested seated rest break.  Mirror feedback for step pattern/ posture correction.  Added 6" hurdles to increase hip/knee flexion with step overs  1 lap before fatigue/ seated rest break.     Ambulate 90 feet in PT clinic/ gym to outside of clinic with elevated RW and cuing for consistent recip. Gait pattern.  Pt. Cued to increase recip. Step pattern.  Pt. Slows down gait at door thresholds/ carpets.  Focus on maintaining consistent cadence, esp. Over the door thresholds.  Hip flexion for clearance and improved heel strike.       TRX squats 5x/ 6x with rest break in between. Good wt. Bearing on B LE and cuing to correct posture/ head position.  No Airex pad to add height today.    NOT TODAY:  Standing Blaze pods UE reaching in //-bars/ mirror: Random set up for 3 minutes 1st attempt and 2 minutes for 2nd attempt.    Posture correction in sitting/standing with mirror feedback. Cuing to correction sh./head posture and prevent lateral lean.   Seated Nautilus: 50# lat. Pull downs with wand (20x2) with sh. Flexion static holds.  MH to back in chair with back support.  Pt.  Focused on upright posture/ head position.    Sit to stand from chair/ Nustep with UE assist for safety.   Extra time required.   Standing 6" step touches with heel (recip. Pattern)- B UE assist.  20x.  Limited upright posture.     PATIENT EDUCATION:  Education details: Daily walking/ posture Person educated: Patient Education method: Medical illustrator Education comprehension: verbalized understanding and returned demonstration  HOME EXERCISE PROGRAM: See handouts  ASSESSMENT:  CLINICAL IMPRESSION:    Pt. Requires extra time and PT provided SBA for gait to the gym for safety.  PT continued to be challenged with marching in //-bars/ trust of knee.  Pt. Works hard during PT tx. Session with several seated rest breaks to due fatigue. Pt. Is continually challenged with standing there.ex./ TRX squats.  Pt. Reports fatigue at end of tx. Limiting progression of there.ex./ goal reassessment.  Pt. will benefit from skilled PT services in combination with gym based ex. To improve LE strengthening/ pain-free mobility.   OBJECTIVE IMPAIRMENTS: Abnormal gait, decreased activity tolerance, decreased balance, decreased endurance, decreased mobility, difficulty walking, decreased ROM, decreased strength, hypomobility, increased edema, impaired flexibility, improper body mechanics, postural dysfunction, and pain.   ACTIVITY LIMITATIONS: bending, sitting, standing, squatting, stairs, transfers, bed mobility, toileting, and locomotion level  PARTICIPATION LIMITATIONS: medication management  PERSONAL FACTORS: Fitness and Past/current experiences are also affecting patient's functional outcome.   REHAB POTENTIAL: Good  CLINICAL DECISION MAKING: Evolving/moderate complexity  EVALUATION COMPLEXITY: Moderate   GOALS: Goals reviewed with patient? Yes  SHORT TERM GOALS: Target date: 07/04/23  Pt. Will be independent with HEP to increase B LE muscle strength 1/2 muscle grade to improve  transfer/ safety with walking.  Baseline:  see above Goal status: Not met  2.  Pt. Will complete with no rest breaks to improve walking endurance.   Baseline: TBD.  12/5: not completed at this time secondary to increase mid-back pain and inability to walk 6 minutes.    Goal status: On-going   LONG TERM GOALS: Target date: 07/25/23  Pt. Will increase LEFS score by 10 points to improve pain-free functional mobility.  Baseline:  LEFS: TBD Goal status: Initial  2.  Pt. Able to stand from 21# chair height with no UE assist or low back pain to improve transfers/ independence with daily activity.   Baseline: benefits from armrests/ UE assist.  Goal status: Not met  3.  Pt. Able to return  to gym based ex. Program with no limitations or c/o low back pain.   Baseline: currently not exercising or going to gym.  12/5: pt. Returned to gym for a few weeks prior to family issues/ no recent gym visits. Goal status: Partially met   PLAN:   PT FREQUENCY: 2x/week  PT DURATION: 6 weeks  PLANNED INTERVENTIONS: Therapeutic exercises, Therapeutic activity, Neuromuscular re-education, Balance training, Gait training, Patient/Family education, Self Care, Joint mobilization, DME instructions, Dry Needling, Electrical stimulation, Cryotherapy, Moist heat, and Manual therapy.  PLAN FOR NEXT SESSION: Attempt at beginning of tx. So pt. Not fatigued. Complete LEFS   Cammie Mcgee, PT, DPT # [phone] 9:37 AM,06/26/23

## 2023-06-27 ENCOUNTER — Ambulatory Visit: Admitting: Physical Therapy

## 2023-06-27 DIAGNOSIS — R262 Difficulty in walking, not elsewhere classified: Secondary | ICD-10-CM | POA: Diagnosis not present

## 2023-06-27 DIAGNOSIS — M6281 Muscle weakness (generalized): Secondary | ICD-10-CM

## 2023-06-27 DIAGNOSIS — R2689 Other abnormalities of gait and mobility: Secondary | ICD-10-CM

## 2023-06-27 DIAGNOSIS — M5459 Other low back pain: Secondary | ICD-10-CM

## 2023-06-27 DIAGNOSIS — R2681 Unsteadiness on feet: Secondary | ICD-10-CM

## 2023-06-28 ENCOUNTER — Ambulatory Visit: Payer: Medicare Other | Admitting: Obstetrics and Gynecology

## 2023-06-30 NOTE — Therapy (Signed)
 OUTPATIENT PHYSICAL THERAPY THORACOLUMBAR TREATMENT  Patient Name: Hannah Powell MRN: [id number] DOB:[birth year], 78 y.o., female Today's Date: 06/27/2023  END OF SESSION:  PT End of Session - 06/30/23 0549     Visit Number 28    Number of Visits 37    Date for PT Re-Evaluation 07/25/23    PT Start Time 1111    PT Stop Time 1200    PT Time Calculation (min) 49 min            Past Medical History:  Diagnosis Date   Anxiety    Arthritis    CHF (congestive heart failure) (HCC)    COPD (chronic obstructive pulmonary disease) (HCC)    Dysplastic nevus 04/22/2019   R abdomen parallel to sup umbilicus - mod   Dysplastic nevus 04/22/2019   R low back above waistline - mild    Edema    feet/legs   Hypertension    dr Darryll Capers      Hypothyroidism    Leg weakness, bilateral    Osteoporosis    Oxygen deficit    2l with bipap at night   RLS (restless legs syndrome)    Shortness of breath    Sleep apnea    bipap   Wheezing    Past Surgical History:  Procedure Laterality Date   BACK SURGERY     cervical   BREAST BIOPSY Left    needle bx-neg   CATARACT EXTRACTION W/PHACO Left 05/07/2017   Procedure: CATARACT EXTRACTION PHACO AND INTRAOCULAR LENS PLACEMENT (IOC);  Surgeon: Galen Manila, MD;  Location: [facility];  Service: Ophthalmology;  Laterality: Left;  [place] 00:48.7 AP% 11.3 CDE 5.46 Fluid Pack Lot # [id number]   CATARACT EXTRACTION W/PHACO Right 04/23/2017   Procedure: CATARACT EXTRACTION PHACO AND INTRAOCULAR LENS PLACEMENT (IOC);  Surgeon: Galen Manila, MD;  Location: [facility];  Service: Ophthalmology;  Laterality: Right;  [place] 00:29 AP% 12.8 CDE 3.79 FLUID PACK LOT # [id number]   CESAREAN SECTION     FOOT ARTHROPLASTY     JOINT REPLACEMENT     x2 tkr   TOTAL SHOULDER ARTHROPLASTY Left 07/02/2013   Procedure: LEFT TOTAL SHOULDER ARTHROPLASTY;  Surgeon: Senaida Lange, MD;  Location: [facility];  Service: Orthopedics;  Laterality: Left;   TOTAL SHOULDER  ARTHROPLASTY Right 04/10/2018   Procedure: TOTAL REVERSE SHOULDER ARTHROPLASTY;  Surgeon: Francena Hanly, MD;  Location: [facility];  Service: Orthopedics;  Laterality: Right;    Patient Active Problem List   Diagnosis Date Noted   SUI (stress urinary incontinence, female) 05/03/2023   Overactive bladder 05/03/2023   Bilateral leg pain 08/29/2022   Cervical radiculopathy 08/28/2022   CHF exacerbation (HCC) 08/26/2022   Swelling of lower extremity 08/26/2022   Acute on chronic respiratory failure with hypoxia and hypercapnia (HCC) 03/29/2022   Chronic diastolic CHF (congestive heart failure) (HCC) 03/29/2022   Cellulitis of right lower extremity 03/29/2022   Restless leg syndrome 02/08/2022   Hypothyroidism 02/08/2022   Obesity (BMI 30-39.9) 02/08/2022   Insomnia 02/08/2022   Anxiety 02/08/2022   Poor appetite 02/08/2022   S/P reverse total shoulder arthroplasty, 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   Chronic diastolic heart failure (HCC) 08/09/2016   COPD (chronic obstructive pulmonary disease) (HCC) 08/09/2016   Pulmonary infiltrates    Pneumonia due to Streptococcus pneumoniae (HCC)    Fever    Community acquired pneumonia of right lung    Palliative care by specialist    Goals of care, counseling/discussion    Pressure injury of  skin 07/03/2016   Acute respiratory failure with hypoxia (HCC) 07/02/2016   Acute on chronic diastolic CHF (congestive heart failure) (HCC) 07/21/2015   COPD exacerbation (HCC) 07/21/2015   Acute renal insufficiency 07/21/2015   Hyperglycemia 07/21/2015   HTN (hypertension) 07/21/2015   S/P shoulder replacement 07/02/2013   PCP: Melchor Spoon, MD  REFERRING PROVIDER: Melchor Spoon, MD  REFERRING DIAG: Lumbar DDD, Neuropathy  Rationale for Evaluation and Treatment: Rehabilitation  THERAPY DIAG:  Difficulty in walking, not elsewhere classified  Muscle weakness (generalized)  Other low back pain  Unsteadiness on feet  Imbalance  ONSET DATE:  03/20/2019  SUBJECTIVE:                                                                                                                                                                                           SUBJECTIVE STATEMENT: Pt. Reports chronic h/o lumbar stenosis/ back pain.  Pt. Reports 8/10 low back pain first thing in the morning and uses heating pad/ ibuprofen to decrease back pain to 1/10 after about 2 hours.  Pt. Reports no recent falls and using elevated RW for short distance ambulation and w/c for community and some household mobility.  Pt. States she has poor standing/walking endurance and is very slow with movement, which is why she requires use of w/c.  Increase episodes of COPD since last PT tx. Session.  Pt. Has 2 weddings to attend on 10/26 and 11/16.    PERTINENT HISTORY:  See MD notes.    PAIN:  Are you having pain? Yes: NPRS scale: 8/10 Pain location: low back Pain description: aching Aggravating factors: lying supine/ getting out of bed.  Relieving factors: moist heat  PRECAUTIONS: Fall  RED FLAGS: None   WEIGHT BEARING RESTRICTIONS: No  FALLS:  Has patient fallen in last 6 months? No  LIVING ENVIRONMENT: Lives with: lives with their spouse Lives in: House/apartment Stairs: No Has following equipment at home: Environmental consultant - 2 wheeled, Wheelchair (manual), and Tour manager  OCCUPATION: Retired  PLOF: Requires assistive device for independence  PATIENT GOALS: Increase standing/walking endurance.  Decrease low back pain.  NEXT MD VISIT: PRN  OBJECTIVE:  Note: Objective measures were completed at Evaluation unless otherwise noted.  DIAGNOSTIC FINDINGS:  COMPARISON:  Lumbar spine MRI from 06/26/2020   FINDINGS: Normal alignment. The vertebral body heights are well preserved. No signs of acute fracture or subluxation. Moderate multilevel disc space narrowing and anterior and posterior spur formation. Facet arthropathy identified within the lower lumbar  spine. Aortic atherosclerosis. Moderate stool burden noted within the colon.   IMPRESSION: 1. No acute findings. 2. Moderate  multilevel degenerative disc disease and facet arthropathy.  PATIENT SURVEYS:  FOTO initial 40/ goal 72  SCREENING FOR RED FLAGS: Bowel or bladder incontinence: yes Spinal tumors: No Cauda equina syndrome: No Compression fracture: No Abdominal aneurysm: No  COGNITION: Overall cognitive status: Within functional limits for tasks assessed   SENSATION: WFL, moderate lower leg swelling noted.  MUSCLE LENGTH: No supine positioning today Hamstrings: NT Thomas test: NT   POSTURE: rounded shoulders, forward head, increased thoracic kyphosis, and flexed trunk   PALPATION: Tenderness over lumbar paraspinals (generalized).  LUMBAR ROM:   AROM eval  Flexion 50% limited  Extension 100% limited  Right lateral flexion 50% limited  Left lateral flexion 50% limited  Right rotation 50% limited  Left rotation 50% limited   (Blank rows = not tested)  LOWER EXTREMITY ROM:       B hip flexion/ knee extension limited.    LOWER EXTREMITY MMT:       B LE muscle strength grossly 4/5 MMT except hamstring 4+/5 MMT and hip flexion 4-/5 MMT  Ascends steps with L LE and heavy UE assist.   LUMBAR SPECIAL TESTS:  Next tx. Session.  No supine positioning today.  FUNCTIONAL TESTS:  5 times sit to stand: 22.13 sec. With use of armrests.  Pt. Limited with STS from gray chair without UE assist.   6 minute walk test: TBD  GAIT: Distance walked: in clinic with elevated RW Assistive device utilized: Walker - 2 wheeled Level of assistance: Modified independence Comments: Significant forward head/ flexed posture/ thoracic kyphosis.  Slow, controlled gait pattern.  Fear of L knee buckling with R LE swing through phase of gait.   Walking in //-bars from blue line to blue line (1 lap)-  20.75 seconds/ 18.56 seconds.    TODAY'S TREATMENT:                                                                                                                               DATE: 06/27/2023    Subjective:  Pt. reports no low back pain and L shoulder is still hurting.  Pt. lymphedema continues to be managed by therapist and waiting on compression machine for home.  Pt. Entered PT with slow but consistent recip. Gait pattern while using elevated RW.      There.ex.:  Nustep L4 at seat position 12 for 10+ min. B UE/LE (consistent cadence)- MH to back.  No rest breaks.    Seated marching/ LAQ 20x.    Standing mini-squats at //-bars 10x.    Discussed gym based there.ex./ HEP.  There.act.:  Walking in //-bars: forward for timed gait reassessment (blue mark to end of blue mark)- 9.66 sec./ 10.62 sec.)- walking in //-bars forward/backwards/lateral.  Ambulate 144 feet in PT clinic/ gym to outside of clinic with elevated RW and cuing for consistent recip. Gait pattern.  Pt. Cued to increase recip. Step pattern.  Pt. Slows down gait at door thresholds/ carpets.  Focus on maintaining consistent cadence, esp. Over the door thresholds.  Descending outside ramp to access driver's side car door.  Pt. Very cautious and cuing to increase step length.    TRX squats 10x2 with posture correction in sitting/standing.  Cuing to correct sh./head posture and prevent lateral lean.    PATIENT EDUCATION:  Education details: Daily walking/ posture Person educated: Patient Education method: Medical illustrator Education comprehension: verbalized understanding and returned demonstration  HOME EXERCISE PROGRAM: See handouts  ASSESSMENT:  CLINICAL IMPRESSION:    Pt. Requires extra time and PT provided SBA for gait to the gym for safety.  PT continued to be challenged with marching in //-bars/ trust of knee.  Pt. Works hard during PT tx. Session with several seated rest breaks to due fatigue. Pt. Is continually challenged with standing there.ex./ squats.  Pt. Reports fatigue at end of  tx. Limiting progression of there.ex./ goal reassessment.  Pt. will benefit from skilled PT services in combination with gym based ex. To improve LE strengthening/ pain-free mobility.   OBJECTIVE IMPAIRMENTS: Abnormal gait, decreased activity tolerance, decreased balance, decreased endurance, decreased mobility, difficulty walking, decreased ROM, decreased strength, hypomobility, increased edema, impaired flexibility, improper body mechanics, postural dysfunction, and pain.   ACTIVITY LIMITATIONS: bending, sitting, standing, squatting, stairs, transfers, bed mobility, toileting, and locomotion level  PARTICIPATION LIMITATIONS: medication management  PERSONAL FACTORS: Fitness and Past/current experiences are also affecting patient's functional outcome.   REHAB POTENTIAL: Good  CLINICAL DECISION MAKING: Evolving/moderate complexity  EVALUATION COMPLEXITY: Moderate   GOALS: Goals reviewed with patient? Yes  SHORT TERM GOALS: Target date: 07/04/23  Pt. Will be independent with HEP to increase B LE muscle strength 1/2 muscle grade to improve transfer/ safety with walking.  Baseline:  see above Goal status: Not met  2.  Pt. Will complete with no rest breaks to improve walking endurance.   Baseline: TBD.  12/5: not completed at this time secondary to increase mid-back pain and inability to walk 6 minutes.    Goal status: On-going   LONG TERM GOALS: Target date: 07/25/23  Pt. Will increase LEFS score by 10 points to improve pain-free functional mobility.  Baseline:  LEFS: TBD Goal status: Initial  2.  Pt. Able to stand from 21# chair height with no UE assist or low back pain to improve transfers/ independence with daily activity.   Baseline: benefits from armrests/ UE assist.  Goal status: Not met  3.  Pt. Able to return to gym based ex. Program with no limitations or c/o low back pain.   Baseline: currently not exercising or going to gym.  12/5: pt. Returned to gym for a few  weeks prior to family issues/ no recent gym visits. Goal status: Partially met   PLAN:   PT FREQUENCY: 2x/week  PT DURATION: 6 weeks  PLANNED INTERVENTIONS: Therapeutic exercises, Therapeutic activity, Neuromuscular re-education, Balance training, Gait training, Patient/Family education, Self Care, Joint mobilization, DME instructions, Dry Needling, Electrical stimulation, Cryotherapy, Moist heat, and Manual therapy.  PLAN FOR NEXT SESSION: Attempt at beginning of tx. So pt. Not fatigued. Complete LEFS   Lendell Quarry, PT, DPT # [phone] 5:50 AM,06/30/23

## 2023-07-01 ENCOUNTER — Ambulatory Visit: Admitting: Physical Therapy

## 2023-07-01 ENCOUNTER — Ambulatory Visit: Admitting: Obstetrics and Gynecology

## 2023-07-01 ENCOUNTER — Encounter: Payer: Self-pay | Admitting: Physical Therapy

## 2023-07-01 DIAGNOSIS — R262 Difficulty in walking, not elsewhere classified: Secondary | ICD-10-CM

## 2023-07-01 DIAGNOSIS — M6281 Muscle weakness (generalized): Secondary | ICD-10-CM

## 2023-07-01 DIAGNOSIS — R2689 Other abnormalities of gait and mobility: Secondary | ICD-10-CM

## 2023-07-01 DIAGNOSIS — R2681 Unsteadiness on feet: Secondary | ICD-10-CM

## 2023-07-01 DIAGNOSIS — M5459 Other low back pain: Secondary | ICD-10-CM

## 2023-07-01 NOTE — Therapy (Signed)
 OUTPATIENT PHYSICAL THERAPY THORACOLUMBAR TREATMENT  Patient Name: Hannah Powell MRN: [id number] DOB:1946-01-11, 78 y.o., female Today's Date: 07/01/2023  END OF SESSION:  PT End of Session - 07/01/23 0813     Visit Number 29    Number of Visits 37    Date for PT Re-Evaluation 07/25/23    PT Start Time 0811    PT Stop Time 0902    PT Time Calculation (min) 51 min            Past Medical History:  Diagnosis Date   Anxiety    Arthritis    CHF (congestive heart failure) (HCC)    COPD (chronic obstructive pulmonary disease) (HCC)    Dysplastic nevus 04/22/2019   R abdomen parallel to sup umbilicus - mod   Dysplastic nevus 04/22/2019   R low back above waistline - mild    Edema    feet/legs   Hypertension    dr Darryll Capers      Hypothyroidism    Leg weakness, bilateral    Osteoporosis    Oxygen deficit    2l with bipap at night   RLS (restless legs syndrome)    Shortness of breath    Sleep apnea    bipap   Wheezing    Past Surgical History:  Procedure Laterality Date   BACK SURGERY     cervical   BREAST BIOPSY Left    needle bx-neg   CATARACT EXTRACTION W/PHACO Left 05/07/2017   Procedure: CATARACT EXTRACTION PHACO AND INTRAOCULAR LENS PLACEMENT (IOC);  Surgeon: Galen Manila, MD;  Location: [facility];  Service: Ophthalmology;  Laterality: Left;  [place] 00:48.7 AP% 11.3 CDE 5.46 Fluid Pack Lot # [id number] H   CATARACT EXTRACTION W/PHACO Right 04/23/2017   Procedure: CATARACT EXTRACTION PHACO AND INTRAOCULAR LENS PLACEMENT (IOC);  Surgeon: Galen Manila, MD;  Location: [facility];  Service: Ophthalmology;  Laterality: Right;  [place] 00:29 AP% 12.8 CDE 3.79 FLUID PACK LOT # [id number]   CESAREAN SECTION     FOOT ARTHROPLASTY     JOINT REPLACEMENT     x2 tkr   TOTAL SHOULDER ARTHROPLASTY Left 07/02/2013   Procedure: LEFT TOTAL SHOULDER ARTHROPLASTY;  Surgeon: Senaida Lange, MD;  Location: [facility];  Service: Orthopedics;  Laterality: Left;   TOTAL SHOULDER  ARTHROPLASTY Right 04/10/2018   Procedure: TOTAL REVERSE SHOULDER ARTHROPLASTY;  Surgeon: Francena Hanly, MD;  Location: [facility];  Service: Orthopedics;  Laterality: Right;    Patient Active Problem List   Diagnosis Date Noted   SUI (stress urinary incontinence, female) 05/03/2023   Overactive bladder 05/03/2023   Bilateral leg pain 08/29/2022   Cervical radiculopathy 08/28/2022   CHF exacerbation (HCC) 08/26/2022   Swelling of lower extremity 08/26/2022   Acute on chronic respiratory failure with hypoxia and hypercapnia (HCC) 03/29/2022   Chronic diastolic CHF (congestive heart failure) (HCC) 03/29/2022   Cellulitis of right lower extremity 03/29/2022   Restless leg syndrome 02/08/2022   Hypothyroidism 02/08/2022   Obesity (BMI 30-39.9) 02/08/2022   Insomnia 02/08/2022   Anxiety 02/08/2022   Poor appetite 02/08/2022   S/P reverse total shoulder arthroplasty, 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   Chronic diastolic heart failure (HCC) 08/09/2016   COPD (chronic obstructive pulmonary disease) (HCC) 08/09/2016   Pulmonary infiltrates    Pneumonia due to Streptococcus pneumoniae (HCC)    Fever    Community acquired pneumonia of right lung    Palliative care by specialist    Goals of care, counseling/discussion    Pressure injury of  skin 07/03/2016   Acute respiratory failure with hypoxia (HCC) 07/02/2016   Acute on chronic diastolic CHF (congestive heart failure) (HCC) 07/21/2015   COPD exacerbation (HCC) 07/21/2015   Acute renal insufficiency 07/21/2015   Hyperglycemia 07/21/2015   HTN (hypertension) 07/21/2015   S/P shoulder replacement 07/02/2013   PCP: Melchor Spoon, MD  REFERRING PROVIDER: Melchor Spoon, MD  REFERRING DIAG: Lumbar DDD, Neuropathy  Rationale for Evaluation and Treatment: Rehabilitation  THERAPY DIAG:  Difficulty in walking, not elsewhere classified  Muscle weakness (generalized)  Other low back pain  Unsteadiness on feet  Imbalance  ONSET DATE:  03/20/2019  SUBJECTIVE:                                                                                                                                                                                           SUBJECTIVE STATEMENT: Pt. Reports chronic h/o lumbar stenosis/ back pain.  Pt. Reports 8/10 low back pain first thing in the morning and uses heating pad/ ibuprofen to decrease back pain to 1/10 after about 2 hours.  Pt. Reports no recent falls and using elevated RW for short distance ambulation and w/c for community and some household mobility.  Pt. States she has poor standing/walking endurance and is very slow with movement, which is why she requires use of w/c.  Increase episodes of COPD since last PT tx. Session.  Pt. Has 2 weddings to attend on 10/26 and 11/16.    PERTINENT HISTORY:  See MD notes.    PAIN:  Are you having pain? Yes: NPRS scale: 8/10 Pain location: low back Pain description: aching Aggravating factors: lying supine/ getting out of bed.  Relieving factors: moist heat  PRECAUTIONS: Fall  RED FLAGS: None   WEIGHT BEARING RESTRICTIONS: No  FALLS:  Has patient fallen in last 6 months? No  LIVING ENVIRONMENT: Lives with: lives with their spouse Lives in: House/apartment Stairs: No Has following equipment at home: Environmental consultant - 2 wheeled, Wheelchair (manual), and Tour manager  OCCUPATION: Retired  PLOF: Requires assistive device for independence  PATIENT GOALS: Increase standing/walking endurance.  Decrease low back pain.  NEXT MD VISIT: PRN  OBJECTIVE:  Note: Objective measures were completed at Evaluation unless otherwise noted.  DIAGNOSTIC FINDINGS:  COMPARISON:  Lumbar spine MRI from 06/26/2020   FINDINGS: Normal alignment. The vertebral body heights are well preserved. No signs of acute fracture or subluxation. Moderate multilevel disc space narrowing and anterior and posterior spur formation. Facet arthropathy identified within the lower lumbar  spine. Aortic atherosclerosis. Moderate stool burden noted within the colon.   IMPRESSION: 1. No acute findings. 2. Moderate  multilevel degenerative disc disease and facet arthropathy.  PATIENT SURVEYS:  FOTO initial 40/ goal 22  SCREENING FOR RED FLAGS: Bowel or bladder incontinence: yes Spinal tumors: No Cauda equina syndrome: No Compression fracture: No Abdominal aneurysm: No  COGNITION: Overall cognitive status: Within functional limits for tasks assessed   SENSATION: WFL, moderate lower leg swelling noted.  MUSCLE LENGTH: No supine positioning today Hamstrings: NT Thomas test: NT   POSTURE: rounded shoulders, forward head, increased thoracic kyphosis, and flexed trunk   PALPATION: Tenderness over lumbar paraspinals (generalized).  LUMBAR ROM:   AROM eval  Flexion 50% limited  Extension 100% limited  Right lateral flexion 50% limited  Left lateral flexion 50% limited  Right rotation 50% limited  Left rotation 50% limited   (Blank rows = not tested)  LOWER EXTREMITY ROM:       B hip flexion/ knee extension limited.    LOWER EXTREMITY MMT:       B LE muscle strength grossly 4/5 MMT except hamstring 4+/5 MMT and hip flexion 4-/5 MMT  Ascends steps with L LE and heavy UE assist.   LUMBAR SPECIAL TESTS:  Next tx. Session.  No supine positioning today.  FUNCTIONAL TESTS:  5 times sit to stand: 22.13 sec. With use of armrests.  Pt. Limited with STS from gray chair without UE assist.   6 minute walk test: TBD  GAIT: Distance walked: in clinic with elevated RW Assistive device utilized: Walker - 2 wheeled Level of assistance: Modified independence Comments: Significant forward head/ flexed posture/ thoracic kyphosis.  Slow, controlled gait pattern.  Fear of L knee buckling with R LE swing through phase of gait.   Walking in //-bars from blue line to blue line (1 lap)-  20.75 seconds/ 18.56 seconds.    4/10: gait reassessment (blue mark to end of blue  mark)- 9.66 sec./ 10.62 sec.)  TODAY'S TREATMENT:                                                                                                                              DATE: 07/01/2023    Subjective:  Pt. reports no low back pain prior to tx. Session.  Pt. lymphedema continues to be managed by therapist and going to [facility] today to pick up today.   Pt. Entered PT with slow but consistent recip. Gait pattern while using elevated RW.      LEFS: 26 out of 80  There.ex.:  : 164 feet with elevated RW.  No standing rest breaks.    Nustep L4 at seat position 12 for 10+ min. B UE/LE (consistent cadence)- MH to back.  No rest breaks.    Seated marching/ LAQ 20x.    Discussed gym based there.ex./ HEP.  There.act.:  TRX squats 10x/ 3x (fatigue) with posture correction in sitting/standing.  Cuing to correct sh./head posture and prevent lateral lean.   Standing step touches at stairs 20x. Working on less UE assist and  maintaining upright posture/ head position.    Ambulate 138 feet in PT clinic/ gym to outside of clinic with elevated RW and cuing for consistent recip. Gait pattern.  Pt. Cued to increase recip. Step pattern.  Pt. Slows down gait at door thresholds/ carpets.  Focus on maintaining consistent cadence, esp. Over the door thresholds.    PATIENT EDUCATION:  Education details: Daily walking/ posture Person educated: Patient Education method: Medical illustrator Education comprehension: verbalized understanding and returned demonstration  HOME EXERCISE PROGRAM: See handouts  ASSESSMENT:  CLINICAL IMPRESSION:    Pt. Requires extra time and PT provided SBA for gait to the gym for safety.  PT continued to be challenged with standing step touches with less UE assist.  Pt. Works hard during PT tx. Session with several seated rest breaks to due fatigue. Pt. Is continually challenged with standing there.ex./ squats.  Pt. Reports fatigue at end of tx. Limiting  progression of there.ex./ goal reassessment.  Pt. will benefit from skilled PT services in combination with gym based ex. To improve LE strengthening/ pain-free mobility.   OBJECTIVE IMPAIRMENTS: Abnormal gait, decreased activity tolerance, decreased balance, decreased endurance, decreased mobility, difficulty walking, decreased ROM, decreased strength, hypomobility, increased edema, impaired flexibility, improper body mechanics, postural dysfunction, and pain.   ACTIVITY LIMITATIONS: bending, sitting, standing, squatting, stairs, transfers, bed mobility, toileting, and locomotion level  PARTICIPATION LIMITATIONS: medication management  PERSONAL FACTORS: Fitness and Past/current experiences are also affecting patient's functional outcome.   REHAB POTENTIAL: Good  CLINICAL DECISION MAKING: Evolving/moderate complexity  EVALUATION COMPLEXITY: Moderate   GOALS: Goals reviewed with patient? Yes  SHORT TERM GOALS: Target date: 07/04/23  Pt. Will be independent with HEP to increase B LE muscle strength 1/2 muscle grade to improve transfer/ safety with walking.  Baseline:  see above Goal status: Not met  2.  Pt. Will complete with no rest breaks to improve walking endurance.   Baseline: TBD.  12/5: not completed at this time secondary to increase mid-back pain and inability to walk 6 minutes.    Goal status: On-going   LONG TERM GOALS: Target date: 07/25/23  Pt. Will increase LEFS score by 10 points to improve pain-free functional mobility.  Baseline:  LEFS: TBD Goal status: Initial  2.  Pt. Able to stand from 21# chair height with no UE assist or low back pain to improve transfers/ independence with daily activity.   Baseline: benefits from armrests/ UE assist.  Goal status: Not met  3.  Pt. Able to return to gym based ex. Program with no limitations or c/o low back pain.   Baseline: currently not exercising or going to gym.  12/5: pt. Returned to gym for a few weeks prior to  family issues/ no recent gym visits. Goal status: Partially met   PLAN:   PT FREQUENCY: 2x/week  PT DURATION: 6 weeks  PLANNED INTERVENTIONS: Therapeutic exercises, Therapeutic activity, Neuromuscular re-education, Balance training, Gait training, Patient/Family education, Self Care, Joint mobilization, DME instructions, Dry Needling, Electrical stimulation, Cryotherapy, Moist heat, and Manual therapy.  PLAN FOR NEXT SESSION: 10th visit progress note   Lendell Quarry, PT, DPT # [phone] 11:44 AM,07/01/23

## 2023-07-04 ENCOUNTER — Ambulatory Visit: Payer: Medicare Other | Admitting: Obstetrics and Gynecology

## 2023-07-08 ENCOUNTER — Ambulatory Visit: Admitting: Obstetrics and Gynecology

## 2023-07-10 ENCOUNTER — Encounter: Payer: Self-pay | Admitting: Physical Therapy

## 2023-07-10 ENCOUNTER — Ambulatory Visit: Admitting: Physical Therapy

## 2023-07-10 DIAGNOSIS — M5459 Other low back pain: Secondary | ICD-10-CM

## 2023-07-10 DIAGNOSIS — R2689 Other abnormalities of gait and mobility: Secondary | ICD-10-CM

## 2023-07-10 DIAGNOSIS — M6281 Muscle weakness (generalized): Secondary | ICD-10-CM

## 2023-07-10 DIAGNOSIS — R262 Difficulty in walking, not elsewhere classified: Secondary | ICD-10-CM | POA: Diagnosis not present

## 2023-07-10 DIAGNOSIS — R2681 Unsteadiness on feet: Secondary | ICD-10-CM

## 2023-07-10 NOTE — Therapy (Signed)
 OUTPATIENT PHYSICAL THERAPY THORACOLUMBAR TREATMENT Physical Therapy Progress Note  Dates of reporting period  05/07/23   to   07/10/23   Patient Name: Hannah Powell MRN: [id number] DOB:1946-02-03, 78 y.o., female Today's Date: 07/10/2023  END OF SESSION:  PT End of Session - 07/10/23 1437     Visit Number 30    Number of Visits 37    Date for PT Re-Evaluation 07/25/23    PT Start Time 1425    PT Stop Time 1519    PT Time Calculation (min) 54 min            Past Medical History:  Diagnosis Date   Anxiety    Arthritis    CHF (congestive heart failure) (HCC)    COPD (chronic obstructive pulmonary disease) (HCC)    Dysplastic nevus 04/22/2019   R abdomen parallel to sup umbilicus - mod   Dysplastic nevus 04/22/2019   R low back above waistline - mild    Edema    feet/legs   Hypertension    dr Reinaldo Caras      Hypothyroidism    Leg weakness, bilateral    Osteoporosis    Oxygen  deficit    2l with bipap at night   RLS (restless legs syndrome)    Shortness of breath    Sleep apnea    bipap   Wheezing    Past Surgical History:  Procedure Laterality Date   BACK SURGERY     cervical   BREAST BIOPSY Left    needle bx-neg   CATARACT EXTRACTION W/PHACO Left 05/07/2017   Procedure: CATARACT EXTRACTION PHACO AND INTRAOCULAR LENS PLACEMENT (IOC);  Surgeon: Clair Crews, MD;  Location: [facility];  Service: Ophthalmology;  Laterality: Left;  US  00:48.7 AP% 11.3 CDE 5.46 Fluid Pack Lot # [id number]   CATARACT EXTRACTION W/PHACO Right 04/23/2017   Procedure: CATARACT EXTRACTION PHACO AND INTRAOCULAR LENS PLACEMENT (IOC);  Surgeon: Clair Crews, MD;  Location: [facility];  Service: Ophthalmology;  Laterality: Right;  US  00:29 AP% 12.8 CDE 3.79 FLUID PACK LOT # [id number]   CESAREAN SECTION     FOOT ARTHROPLASTY     JOINT REPLACEMENT     x2 tkr   TOTAL SHOULDER ARTHROPLASTY Left 07/02/2013   Procedure: LEFT TOTAL SHOULDER ARTHROPLASTY;  Surgeon: Glo Larch, MD;   Location: [facility];  Service: Orthopedics;  Laterality: Left;   TOTAL SHOULDER ARTHROPLASTY Right 04/10/2018   Procedure: TOTAL REVERSE SHOULDER ARTHROPLASTY;  Surgeon: Ellard Gunning, MD;  Location: [facility];  Service: Orthopedics;  Laterality: Right;    Patient Active Problem List   Diagnosis Date Noted   SUI (stress urinary incontinence, female) 05/03/2023   Overactive bladder 05/03/2023   Bilateral leg pain 08/29/2022   Cervical radiculopathy 08/28/2022   CHF exacerbation (HCC) 08/26/2022   Swelling of lower extremity 08/26/2022   Acute on chronic respiratory failure with hypoxia and hypercapnia (HCC) 03/29/2022   Chronic diastolic CHF (congestive heart failure) (HCC) 03/29/2022   Cellulitis of right lower extremity 03/29/2022   Restless leg syndrome 02/08/2022   Hypothyroidism 02/08/2022   Obesity (BMI 30-39.9) 02/08/2022   Insomnia 02/08/2022   Anxiety 02/08/2022   Poor appetite 02/08/2022   S/P reverse total shoulder arthroplasty, 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   Chronic diastolic heart failure (HCC) 08/09/2016   COPD (chronic obstructive pulmonary disease) (HCC) 08/09/2016   Pulmonary infiltrates    Pneumonia due to Streptococcus pneumoniae (HCC)    Fever    Community acquired pneumonia of right lung  Palliative care by specialist    Goals of care, counseling/discussion    Pressure injury of skin 07/03/2016   Acute respiratory failure with hypoxia (HCC) 07/02/2016   Acute on chronic diastolic CHF (congestive heart failure) (HCC) 07/21/2015   COPD exacerbation (HCC) 07/21/2015   Acute renal insufficiency 07/21/2015   Hyperglycemia 07/21/2015   HTN (hypertension) 07/21/2015   S/P shoulder replacement 07/02/2013   PCP: Melchor Spoon, MD  REFERRING PROVIDER: Melchor Spoon, MD  REFERRING DIAG: Lumbar DDD, Neuropathy  Rationale for Evaluation and Treatment: Rehabilitation  THERAPY DIAG:  Difficulty in walking, not elsewhere classified  Muscle weakness  (generalized)  Other low back pain  Unsteadiness on feet  Imbalance  ONSET DATE: 03/20/2019  SUBJECTIVE:                                                                                                                                                                                           SUBJECTIVE STATEMENT: Pt. Reports chronic h/o lumbar stenosis/ back pain.  Pt. Reports 8/10 low back pain first thing in the morning and uses heating pad/ ibuprofen  to decrease back pain to 1/10 after about 2 hours.  Pt. Reports no recent falls and using elevated RW for short distance ambulation and w/c for community and some household mobility.  Pt. States she has poor standing/walking endurance and is very slow with movement, which is why she requires use of w/c.  Increase episodes of COPD since last PT tx. Session.  Pt. Has 2 weddings to attend on 10/26 and 11/16.    PERTINENT HISTORY:  See MD notes.    PAIN:  Are you having pain? Yes: NPRS scale: 8/10 Pain location: low back Pain description: aching Aggravating factors: lying supine/ getting out of bed.  Relieving factors: moist heat  PRECAUTIONS: Fall  RED FLAGS: None   WEIGHT BEARING RESTRICTIONS: No  FALLS:  Has patient fallen in last 6 months? No  LIVING ENVIRONMENT: Lives with: lives with their spouse Lives in: House/apartment Stairs: No Has following equipment at home: Environmental consultant - 2 wheeled, Wheelchair (manual), and Tour manager  OCCUPATION: Retired  PLOF: Requires assistive device for independence  PATIENT GOALS: Increase standing/walking endurance.  Decrease low back pain.  NEXT MD VISIT: PRN  OBJECTIVE:  Note: Objective measures were completed at Evaluation unless otherwise noted.  DIAGNOSTIC FINDINGS:  COMPARISON:  Lumbar spine MRI from 06/26/2020   FINDINGS: Normal alignment. The vertebral body heights are well preserved. No signs of acute fracture or subluxation. Moderate multilevel disc space narrowing and  anterior and posterior spur formation. Facet arthropathy identified within the lower lumbar spine. Aortic  atherosclerosis. Moderate stool burden noted within the colon.   IMPRESSION: 1. No acute findings. 2. Moderate multilevel degenerative disc disease and facet arthropathy.  PATIENT SURVEYS:  FOTO initial 40/ goal 48  SCREENING FOR RED FLAGS: Bowel or bladder incontinence: yes Spinal tumors: No Cauda equina syndrome: No Compression fracture: No Abdominal aneurysm: No  COGNITION: Overall cognitive status: Within functional limits for tasks assessed   SENSATION: WFL, moderate lower leg swelling noted.  MUSCLE LENGTH: No supine positioning today Hamstrings: NT Thomas test: NT   POSTURE: rounded shoulders, forward head, increased thoracic kyphosis, and flexed trunk   PALPATION: Tenderness over lumbar paraspinals (generalized).  LUMBAR ROM:   AROM eval  Flexion 50% limited  Extension 100% limited  Right lateral flexion 50% limited  Left lateral flexion 50% limited  Right rotation 50% limited  Left rotation 50% limited   (Blank rows = not tested)  LOWER EXTREMITY ROM:       B hip flexion/ knee extension limited.    LOWER EXTREMITY MMT:       B LE muscle strength grossly 4/5 MMT except hamstring 4+/5 MMT and hip flexion 4-/5 MMT  Ascends steps with L LE and heavy UE assist.   LUMBAR SPECIAL TESTS:  Next tx. Session.  No supine positioning today.  FUNCTIONAL TESTS:  5 times sit to stand: 22.13 sec. With use of armrests.  Pt. Limited with STS from gray chair without UE assist.   6 minute walk test: TBD  GAIT: Distance walked: in clinic with elevated RW Assistive device utilized: Walker - 2 wheeled Level of assistance: Modified independence Comments: Significant forward head/ flexed posture/ thoracic kyphosis.  Slow, controlled gait pattern.  Fear of L knee buckling with R LE swing through phase of gait.   Walking in //-bars from blue line to blue line (1  lap)-  20.75 seconds/ 18.56 seconds.    4/10: gait reassessment (blue mark to end of blue mark)- 9.66 sec./ 10.62 sec.)  LEFS: 26 out of 80  : 164 feet with elevated RW.  No standing rest breaks.   TODAY'S TREATMENT:                                                                                                                              DATE: 07/10/2023    Subjective:  Pt. reports no low back pain prior to tx. Session.  Pt. Entered PT with slow but consistent recip. Gait pattern while using elevated RW.  Pt. Has been going to gym and used leg press on Monday (sore in legs the next day).    There.ex.:  Nustep L4 at seat position 12 for 10 min. B UE/LE (consistent cadence).  No rest breaks.    Seated marching/ LAQ 20x.  Walking in //-bars with 3" plinth step overs 4 laps.  L/R lateral step over 3" plinth (5x).     Seated wand ex.: chest press/ flexion.    Discussed gym based  there.ex./ HEP.   There.act.:  Ambulate 138 feet in PT clinic/ gym to outside of clinic with elevated RW and cuing for consistent recip. Gait pattern.  Pt. Cued to increase recip. Step pattern.  Pt. Slows down gait at door thresholds/ carpets.  Focus on maintaining consistent cadence, esp. Over the door thresholds.    Standing step touches at stairs 20x. Working on less UE assist and maintaining upright posture/ head position.    Sit to stands with heavy UE assist on arm rests required.    Several seated rest breaks during tx. And pt. Cued for posture correction with head/ shoulders/ lean.    Not Today:  TRX squats 10x/ 3x (fatigue) with posture correction in sitting/standing.  Cuing to correct sh./head posture and prevent lateral lean.   PATIENT EDUCATION:  Education details: Daily walking/ posture Person educated: Patient Education method: Medical illustrator Education comprehension: verbalized understanding and returned demonstration  HOME EXERCISE PROGRAM: See  handouts  ASSESSMENT:  CLINICAL IMPRESSION:    Pt. Requires extra time and PT provided SBA for gait to the gym for safety.  PT continued to be challenged with standing step touches with less UE assist.  Pt. Works hard during PT tx. Session with several seated rest breaks to due fatigue. Pt. Is continually challenged with standing there.ex./ squats.  Pt. Reports fatigue at end of tx. Limiting progression of there.ex./ goal reassessment.  Pt. will benefit from skilled PT services in combination with gym based ex. To improve LE strengthening/ pain-free mobility.   OBJECTIVE IMPAIRMENTS: Abnormal gait, decreased activity tolerance, decreased balance, decreased endurance, decreased mobility, difficulty walking, decreased ROM, decreased strength, hypomobility, increased edema, impaired flexibility, improper body mechanics, postural dysfunction, and pain.   ACTIVITY LIMITATIONS: bending, sitting, standing, squatting, stairs, transfers, bed mobility, toileting, and locomotion level  PARTICIPATION LIMITATIONS: medication management  PERSONAL FACTORS: Fitness and Past/current experiences are also affecting patient's functional outcome.   REHAB POTENTIAL: Good  CLINICAL DECISION MAKING: Evolving/moderate complexity  EVALUATION COMPLEXITY: Moderate   GOALS: Goals reviewed with patient? Yes  SHORT TERM GOALS: Target date: 07/04/23  Pt. Will be independent with HEP to increase B LE muscle strength 1/2 muscle grade to improve transfer/ safety with walking.  Baseline:  see above Goal status: Not met  2.  Pt. Will complete with no rest breaks to improve walking endurance.   Baseline: TBD.  12/5: not completed at this time secondary to increase mid-back pain and inability to walk 6 minutes.    Goal status: On-going   LONG TERM GOALS: Target date: 07/25/23  Pt. Will increase LEFS score by 10 points to improve pain-free functional mobility.  Baseline:  LEFS: TBD Goal status: Initial  2.  Pt.  Able to stand from 21# chair height with no UE assist or low back pain to improve transfers/ independence with daily activity.   Baseline: benefits from armrests/ UE assist.  Goal status: Not met  3.  Pt. Able to return to gym based ex. Program with no limitations or c/o low back pain.   Baseline: currently not exercising or going to gym.  12/5: pt. Returned to gym for a few weeks prior to family issues/ no recent gym visits. Goal status: Partially met   PLAN:   PT FREQUENCY: 2x/week  PT DURATION: 6 weeks  PLANNED INTERVENTIONS: Therapeutic exercises, Therapeutic activity, Neuromuscular re-education, Balance training, Gait training, Patient/Family education, Self Care, Joint mobilization, DME instructions, Dry Needling, Electrical stimulation, Cryotherapy, Moist heat, and Manual therapy.  PLAN  FOR NEXT SESSION: LE strengthening/ gait and  balance tasks.    Lendell Quarry, PT, DPT # [phone] 5:53 PM,07/10/23

## 2023-07-12 ENCOUNTER — Ambulatory Visit: Payer: Medicare Other | Admitting: Obstetrics and Gynecology

## 2023-07-18 ENCOUNTER — Ambulatory Visit: Admitting: Physical Therapy

## 2023-07-18 DIAGNOSIS — M6281 Muscle weakness (generalized): Secondary | ICD-10-CM

## 2023-07-18 DIAGNOSIS — R262 Difficulty in walking, not elsewhere classified: Secondary | ICD-10-CM

## 2023-07-18 DIAGNOSIS — R2689 Other abnormalities of gait and mobility: Secondary | ICD-10-CM

## 2023-07-18 DIAGNOSIS — M5459 Other low back pain: Secondary | ICD-10-CM

## 2023-07-18 DIAGNOSIS — R2681 Unsteadiness on feet: Secondary | ICD-10-CM

## 2023-07-19 ENCOUNTER — Ambulatory Visit: Payer: Medicare Other | Admitting: Obstetrics and Gynecology

## 2023-07-24 ENCOUNTER — Ambulatory Visit: Attending: Internal Medicine | Admitting: Physical Therapy

## 2023-07-24 ENCOUNTER — Encounter: Payer: Self-pay | Admitting: Physical Therapy

## 2023-07-24 DIAGNOSIS — R269 Unspecified abnormalities of gait and mobility: Secondary | ICD-10-CM | POA: Insufficient documentation

## 2023-07-24 DIAGNOSIS — R2681 Unsteadiness on feet: Secondary | ICD-10-CM | POA: Insufficient documentation

## 2023-07-24 DIAGNOSIS — R293 Abnormal posture: Secondary | ICD-10-CM | POA: Diagnosis present

## 2023-07-24 DIAGNOSIS — R2689 Other abnormalities of gait and mobility: Secondary | ICD-10-CM | POA: Insufficient documentation

## 2023-07-24 DIAGNOSIS — M6281 Muscle weakness (generalized): Secondary | ICD-10-CM | POA: Insufficient documentation

## 2023-07-24 DIAGNOSIS — R262 Difficulty in walking, not elsewhere classified: Secondary | ICD-10-CM | POA: Insufficient documentation

## 2023-07-24 DIAGNOSIS — M5459 Other low back pain: Secondary | ICD-10-CM | POA: Diagnosis present

## 2023-07-24 NOTE — Therapy (Signed)
 OUTPATIENT PHYSICAL THERAPY THORACOLUMBAR TREATMENT/ DISCHARGE  Patient Name: Hannah Powell MRN: [id number] DOB:05/24/1945, 78 y.o., female Today's Date: 07/24/2023  END OF SESSION:  PT End of Session - 07/24/23 1433     Visit Number 31    Number of Visits 37    Date for PT Re-Evaluation 07/25/23    PT Start Time 1420            1420 to 1516  (56 minutes).    Past Medical History:  Diagnosis Date   Anxiety    Arthritis    CHF (congestive heart failure) (HCC)    COPD (chronic obstructive pulmonary disease) (HCC)    Dysplastic nevus 04/22/2019   R abdomen parallel to sup umbilicus - mod   Dysplastic nevus 04/22/2019   R low back above waistline - mild    Edema    feet/legs   Hypertension    dr Reinaldo Caras      Hypothyroidism    Leg weakness, bilateral    Osteoporosis    Oxygen  deficit    2l with bipap at night   RLS (restless legs syndrome)    Shortness of breath    Sleep apnea    bipap   Wheezing    Past Surgical History:  Procedure Laterality Date   BACK SURGERY     cervical   BREAST BIOPSY Left    needle bx-neg   CATARACT EXTRACTION W/PHACO Left 05/07/2017   Procedure: CATARACT EXTRACTION PHACO AND INTRAOCULAR LENS PLACEMENT (IOC);  Surgeon: Clair Crews, MD;  Location: [facility];  Service: Ophthalmology;  Laterality: Left;  US  00:48.7 AP% 11.3 CDE 5.46 Fluid Pack Lot # [id number]   CATARACT EXTRACTION W/PHACO Right 04/23/2017   Procedure: CATARACT EXTRACTION PHACO AND INTRAOCULAR LENS PLACEMENT (IOC);  Surgeon: Clair Crews, MD;  Location: [facility];  Service: Ophthalmology;  Laterality: Right;  US  00:29 AP% 12.8 CDE 3.79 FLUID PACK LOT # [id number]   CESAREAN SECTION     FOOT ARTHROPLASTY     JOINT REPLACEMENT     x2 tkr   TOTAL SHOULDER ARTHROPLASTY Left 07/02/2013   Procedure: LEFT TOTAL SHOULDER ARTHROPLASTY;  Surgeon: Glo Larch, MD;  Location: [facility];  Service: Orthopedics;  Laterality: Left;   TOTAL SHOULDER ARTHROPLASTY Right  04/10/2018   Procedure: TOTAL REVERSE SHOULDER ARTHROPLASTY;  Surgeon: Ellard Gunning, MD;  Location: [facility];  Service: Orthopedics;  Laterality: Right;    Patient Active Problem List   Diagnosis Date Noted   SUI (stress urinary incontinence, female) 05/03/2023   Overactive bladder 05/03/2023   Bilateral leg pain 08/29/2022   Cervical radiculopathy 08/28/2022   CHF exacerbation (HCC) 08/26/2022   Swelling of lower extremity 08/26/2022   Acute on chronic respiratory failure with hypoxia and hypercapnia (HCC) 03/29/2022   Chronic diastolic CHF (congestive heart failure) (HCC) 03/29/2022   Cellulitis of right lower extremity 03/29/2022   Restless leg syndrome 02/08/2022   Hypothyroidism 02/08/2022   Obesity (BMI 30-39.9) 02/08/2022   Insomnia 02/08/2022   Anxiety 02/08/2022   Poor appetite 02/08/2022   S/P reverse total shoulder arthroplasty, 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   Chronic diastolic heart failure (HCC) 08/09/2016   COPD (chronic obstructive pulmonary disease) (HCC) 08/09/2016   Pulmonary infiltrates    Pneumonia due to Streptococcus pneumoniae (HCC)    Fever    Community acquired pneumonia of right lung    Palliative care by specialist    Goals of care, counseling/discussion    Pressure injury of skin 07/03/2016   Acute respiratory  failure with hypoxia (HCC) 07/02/2016   Acute on chronic diastolic CHF (congestive heart failure) (HCC) 07/21/2015   COPD exacerbation (HCC) 07/21/2015   Acute renal insufficiency 07/21/2015   Hyperglycemia 07/21/2015   HTN (hypertension) 07/21/2015   S/P shoulder replacement 07/02/2013   PCP: Melchor Spoon, MD  REFERRING PROVIDER: Melchor Spoon, MD  REFERRING DIAG: Lumbar DDD, Neuropathy  Rationale for Evaluation and Treatment: Rehabilitation  THERAPY DIAG:  Difficulty in walking, not elsewhere classified  Muscle weakness (generalized)  Other low back pain  Unsteadiness on feet  Imbalance  Gait difficulty  Abnormal  posture  ONSET DATE: 03/20/2019  SUBJECTIVE:                                                                                                                                                                                           SUBJECTIVE STATEMENT: Pt. Reports chronic h/o lumbar stenosis/ back pain.  Pt. Reports 8/10 low back pain first thing in the morning and uses heating pad/ ibuprofen  to decrease back pain to 1/10 after about 2 hours.  Pt. Reports no recent falls and using elevated RW for short distance ambulation and w/c for community and some household mobility.  Pt. States she has poor standing/walking endurance and is very slow with movement, which is why she requires use of w/c.  Increase episodes of COPD since last PT tx. Session.  Pt. Has 2 weddings to attend on 10/26 and 11/16.    PERTINENT HISTORY:  See MD notes.    PAIN:  Are you having pain? Yes: NPRS scale: 8/10 Pain location: low back Pain description: aching Aggravating factors: lying supine/ getting out of bed.  Relieving factors: moist heat  PRECAUTIONS: Fall  RED FLAGS: None   WEIGHT BEARING RESTRICTIONS: No  FALLS:  Has patient fallen in last 6 months? No  LIVING ENVIRONMENT: Lives with: lives with their spouse Lives in: House/apartment Stairs: No Has following equipment at home: Environmental consultant - 2 wheeled, Wheelchair (manual), and Tour manager  OCCUPATION: Retired  PLOF: Requires assistive device for independence  PATIENT GOALS: Increase standing/walking endurance.  Decrease low back pain.  NEXT MD VISIT: PRN  OBJECTIVE:  Note: Objective measures were completed at Evaluation unless otherwise noted.  DIAGNOSTIC FINDINGS:  COMPARISON:  Lumbar spine MRI from 06/26/2020   FINDINGS: Normal alignment. The vertebral body heights are well preserved. No signs of acute fracture or subluxation. Moderate multilevel disc space narrowing and anterior and posterior spur formation. Facet arthropathy identified  within the lower lumbar spine. Aortic atherosclerosis. Moderate stool burden noted within the colon.   IMPRESSION: 1. No acute findings. 2. Moderate  multilevel degenerative disc disease and facet arthropathy.  PATIENT SURVEYS:  FOTO initial 40/ goal 78  SCREENING FOR RED FLAGS: Bowel or bladder incontinence: yes Spinal tumors: No Cauda equina syndrome: No Compression fracture: No Abdominal aneurysm: No  COGNITION: Overall cognitive status: Within functional limits for tasks assessed   SENSATION: WFL, moderate lower leg swelling noted.  MUSCLE LENGTH: No supine positioning today Hamstrings: NT Thomas test: NT   POSTURE: rounded shoulders, forward head, increased thoracic kyphosis, and flexed trunk   PALPATION: Tenderness over lumbar paraspinals (generalized).  LUMBAR ROM:   AROM eval  Flexion 50% limited  Extension 100% limited  Right lateral flexion 50% limited  Left lateral flexion 50% limited  Right rotation 50% limited  Left rotation 50% limited   (Blank rows = not tested)  LOWER EXTREMITY ROM:       B hip flexion/ knee extension limited.    LOWER EXTREMITY MMT:       B LE muscle strength grossly 4/5 MMT except hamstring 4+/5 MMT and hip flexion 4-/5 MMT  Ascends steps with L LE and heavy UE assist.   LUMBAR SPECIAL TESTS:  Next tx. Session.  No supine positioning today.  FUNCTIONAL TESTS:  5 times sit to stand: 22.13 sec. With use of armrests.  Pt. Limited with STS from gray chair without UE assist.   6 minute walk test: TBD  GAIT: Distance walked: in clinic with elevated RW Assistive device utilized: Walker - 2 wheeled Level of assistance: Modified independence Comments: Significant forward head/ flexed posture/ thoracic kyphosis.  Slow, controlled gait pattern.  Fear of L knee buckling with R LE swing through phase of gait.   Walking in //-bars from blue line to blue line (1 lap)-  20.75 seconds/ 18.56 seconds.    4/10: gait reassessment (blue  mark to end of blue mark)- 9.66 sec./ 10.62 sec.)  LEFS: 26 out of 80  : 164 feet with elevated RW.  No standing rest breaks.   TODAY'S TREATMENT:                                                                                                                              DATE: 07/24/2023   Subjective:  Pt. reports some mid-back pain prior to tx. Session.  Pt. Entered PT with slow but consistent recip. Gait pattern while using elevated RW.  Pt. Going to gym Monday/ Friday and still using leg press (discussed posture).    There.ex.:  Nustep L4 at seat position 12 for 10 min. B UE/LE (consistent cadence).  No rest breaks.    Seated Nautilus:  50# lat. Pulldowns 20x2.    Seated wand ex.: chest press/ flexion 20x.      Seated marching/ LAQ 20x.  Walking in //-bars with 3" plinth step overs 4 laps.  L/R lateral step over 3" plinth (5x).     Discussed gym based there.ex./ HEP.   There.act.:  Standing TRX: squats 14x.  Fatigue limited increase reps.    Ambulate 155 feet in PT clinic/ gym to outside of clinic with elevated RW and cuing for consistent recip. Gait pattern.  Pt. Cued to increase recip. Step pattern.  Pt. Slows down gait at door thresholds/ carpets.  Focus on maintaining consistent cadence, esp. Over the door thresholds.    Standing step touches at stairs 20x. Working on less UE assist and maintaining upright posture/ head position.    Sit to stands with heavy UE assist on arm rests required.    Several seated rest breaks during tx. And pt. Cued for posture correction with head/ shoulders/ lean.    PATIENT EDUCATION:  Education details: Daily walking/ posture Person educated: Patient Education method: Medical illustrator Education comprehension: verbalized understanding and returned demonstration  HOME EXERCISE PROGRAM: See handouts  ASSESSMENT:  CLINICAL IMPRESSION:  07/24/2023   Pt. Requires extra time and PT provided SBA for gait to the gym for safety.   PT continued to be challenged with standing step touches with less UE assist.  Pt. Works hard during PT tx. Session with several seated rest breaks to due fatigue.  Increase reps today with TRX squats with good technique/ head position.  Pt. Is continually challenged with standing there.ex./ squats.  Pt. Reports fatigue at end of tx. Limiting progression of there.ex./ goal reassessment.  Pt. Will benefit from focus on gym based ex./ cardio on a regular basis.  Discharge from PT at this time and pt. Instructed to reach out to PT if any questions or regression in symptoms.    OBJECTIVE IMPAIRMENTS: Abnormal gait, decreased activity tolerance, decreased balance, decreased endurance, decreased mobility, difficulty walking, decreased ROM, decreased strength, hypomobility, increased edema, impaired flexibility, improper body mechanics, postural dysfunction, and pain.   ACTIVITY LIMITATIONS: bending, sitting, standing, squatting, stairs, transfers, bed mobility, toileting, and locomotion level  PARTICIPATION LIMITATIONS: medication management  PERSONAL FACTORS: Fitness and Past/current experiences are also affecting patient's functional outcome.   REHAB POTENTIAL: Good  CLINICAL DECISION MAKING: Evolving/moderate complexity  EVALUATION COMPLEXITY: Moderate   GOALS: Goals reviewed with patient? Yes  SHORT TERM GOALS: Target date: 07/04/23  Pt. Will be independent with HEP to increase B LE muscle strength 1/2 muscle grade to improve transfer/ safety with walking.  Baseline:  see above Goal status: Partially met  2.  Pt. Will complete with no rest breaks to improve walking endurance.   Baseline:  164 feet with elevated RW.  No standing rest breaks.   Goal status: Partially met   LONG TERM GOALS: Target date: 07/25/23  Pt. Will increase LEFS score by 10 points to improve pain-free functional mobility.  Baseline:  see above Goal status: Partially met  2.  Pt. Able to stand from 21# chair  height with no UE assist or low back pain to improve transfers/ independence with daily activity.   Baseline: benefits from armrests/ UE assist.  Goal status: Not met  3.  Pt. Able to return to gym based ex. Program with no limitations or c/o low back pain.   Baseline: currently not exercising or going to gym.  12/5: pt. Returned to gym for a few weeks prior to family issues/ no recent gym visits.  5/7:  Pt. Has been going more consistently.   Goal status: Goal met   PLAN:   PT FREQUENCY: 2x/week  PT DURATION: 6 weeks  PLANNED INTERVENTIONS: Therapeutic exercises, Therapeutic activity, Neuromuscular re-education, Balance training, Gait training, Patient/Family education, Self Care, Joint mobilization, DME instructions,  Dry Needling, Electrical stimulation, Cryotherapy, Moist heat, and Manual therapy.  PLAN FOR NEXT SESSION:  Discharge.  Pt. Instructed to reach out to PT with any changes in status.      Lendell Quarry, PT, DPT # [phone] Physical Therapist - [place]  [facility] 2:36 PM,07/24/23

## 2023-07-26 ENCOUNTER — Ambulatory Visit: Payer: Medicare Other | Admitting: Obstetrics and Gynecology

## 2023-08-02 ENCOUNTER — Ambulatory Visit: Payer: Medicare Other | Admitting: Obstetrics and Gynecology

## 2023-09-09 ENCOUNTER — Other Ambulatory Visit: Payer: Self-pay | Admitting: Internal Medicine

## 2023-09-09 DIAGNOSIS — K429 Umbilical hernia without obstruction or gangrene: Secondary | ICD-10-CM

## 2023-09-25 ENCOUNTER — Ambulatory Visit
Admission: RE | Admit: 2023-09-25 | Discharge: 2023-09-25 | Disposition: A | Discharge status: Home / Self Care | Source: Ambulatory Visit | Attending: Internal Medicine | Admitting: Internal Medicine

## 2023-09-25 DIAGNOSIS — K429 Umbilical hernia without obstruction or gangrene: Secondary | ICD-10-CM | POA: Diagnosis present

## 2023-10-29 ENCOUNTER — Ambulatory Visit: Attending: Internal Medicine | Admitting: Physical Therapy

## 2023-10-29 DIAGNOSIS — R262 Difficulty in walking, not elsewhere classified: Secondary | ICD-10-CM | POA: Insufficient documentation

## 2023-10-29 DIAGNOSIS — R269 Unspecified abnormalities of gait and mobility: Secondary | ICD-10-CM | POA: Insufficient documentation

## 2023-10-29 DIAGNOSIS — M5459 Other low back pain: Secondary | ICD-10-CM | POA: Insufficient documentation

## 2023-10-29 DIAGNOSIS — R293 Abnormal posture: Secondary | ICD-10-CM | POA: Diagnosis present

## 2023-10-29 DIAGNOSIS — R2681 Unsteadiness on feet: Secondary | ICD-10-CM | POA: Insufficient documentation

## 2023-10-29 DIAGNOSIS — R2689 Other abnormalities of gait and mobility: Secondary | ICD-10-CM | POA: Insufficient documentation

## 2023-10-29 DIAGNOSIS — M6281 Muscle weakness (generalized): Secondary | ICD-10-CM | POA: Diagnosis present

## 2023-11-06 ENCOUNTER — Ambulatory Visit: Admitting: Physical Therapy

## 2023-11-06 DIAGNOSIS — R2681 Unsteadiness on feet: Secondary | ICD-10-CM

## 2023-11-06 DIAGNOSIS — R293 Abnormal posture: Secondary | ICD-10-CM

## 2023-11-06 DIAGNOSIS — R269 Unspecified abnormalities of gait and mobility: Secondary | ICD-10-CM

## 2023-11-06 DIAGNOSIS — R262 Difficulty in walking, not elsewhere classified: Secondary | ICD-10-CM | POA: Diagnosis not present

## 2023-11-06 DIAGNOSIS — M6281 Muscle weakness (generalized): Secondary | ICD-10-CM

## 2023-11-06 DIAGNOSIS — R2689 Other abnormalities of gait and mobility: Secondary | ICD-10-CM

## 2023-11-06 DIAGNOSIS — M5459 Other low back pain: Secondary | ICD-10-CM

## 2023-11-09 NOTE — Therapy (Signed)
 OUTPATIENT PHYSICAL THERAPY THORACOLUMBAR EVALUATION  Patient Name: Hannah Powell MRN: [id number] DOB:05/06/1945, 78 y.o., female Today's Date: 10/29/2023  END OF SESSION:  PT End of Session - 11/09/23 1511     Visit Number 1    Number of Visits 12    Date for PT Re-Evaluation 12/10/23    PT Start Time 1056    PT Stop Time 1159    PT Time Calculation (min) 63 min          Past Medical History:  Diagnosis Date   Anxiety    Arthritis    CHF (congestive heart failure) (HCC)    COPD (chronic obstructive pulmonary disease) (HCC)    Dysplastic nevus 04/22/2019   R abdomen parallel to sup umbilicus - mod   Dysplastic nevus 04/22/2019   R low back above waistline - mild    Edema    feet/legs   Hypertension    dr hezzie      Hypothyroidism    Leg weakness, bilateral    Osteoporosis    Oxygen  deficit    2l with bipap at night   RLS (restless legs syndrome)    Shortness of breath    Sleep apnea    bipap   Wheezing    Past Surgical History:  Procedure Laterality Date   BACK SURGERY     cervical   BREAST BIOPSY Left    needle bx-neg   CATARACT EXTRACTION W/PHACO Left 05/07/2017   Procedure: CATARACT EXTRACTION PHACO AND INTRAOCULAR LENS PLACEMENT (IOC);  Surgeon: Jaye Fallow, MD;  Location: [facility];  Service: Ophthalmology;  Laterality: Left;  US  00:48.7 AP% 11.3 CDE 5.46 Fluid Pack Lot # [id number]   CATARACT EXTRACTION W/PHACO Right 04/23/2017   Procedure: CATARACT EXTRACTION PHACO AND INTRAOCULAR LENS PLACEMENT (IOC);  Surgeon: Jaye Fallow, MD;  Location: [facility];  Service: Ophthalmology;  Laterality: Right;  US  00:29 AP% 12.8 CDE 3.79 FLUID PACK LOT # [id number]   CESAREAN SECTION     FOOT ARTHROPLASTY     JOINT REPLACEMENT     x2 tkr   TOTAL SHOULDER ARTHROPLASTY Left 07/02/2013   Procedure: LEFT TOTAL SHOULDER ARTHROPLASTY;  Surgeon: Franky CHRISTELLA Pointer, MD;  Location: [facility];  Service: Orthopedics;  Laterality: Left;   TOTAL SHOULDER  ARTHROPLASTY Right 04/10/2018   Procedure: TOTAL REVERSE SHOULDER ARTHROPLASTY;  Surgeon: Pointer Franky, MD;  Location: [facility];  Service: Orthopedics;  Laterality: Right;    Patient Active Problem List   Diagnosis Date Noted   SUI (stress urinary incontinence, female) 05/03/2023   Overactive bladder 05/03/2023   Bilateral leg pain 08/29/2022   Cervical radiculopathy 08/28/2022   CHF exacerbation (HCC) 08/26/2022   Swelling of lower extremity 08/26/2022   Acute on chronic respiratory failure with hypoxia and hypercapnia (HCC) 03/29/2022   Chronic diastolic CHF (congestive heart failure) (HCC) 03/29/2022   Cellulitis of right lower extremity 03/29/2022   Restless leg syndrome 02/08/2022   Hypothyroidism 02/08/2022   Obesity (BMI 30-39.9) 02/08/2022   Insomnia 02/08/2022   Anxiety 02/08/2022   Poor appetite 02/08/2022   S/P reverse total shoulder arthroplasty, 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   Chronic diastolic heart failure (HCC) 08/09/2016   COPD (chronic obstructive pulmonary disease) (HCC) 08/09/2016   Pulmonary infiltrates    Pneumonia due to Streptococcus pneumoniae (HCC)    Fever    Community acquired pneumonia of right lung    Palliative care by specialist    Goals of care, counseling/discussion    Pressure injury of skin 07/03/2016  Acute respiratory failure with hypoxia (HCC) 07/02/2016   Acute on chronic diastolic CHF (congestive heart failure) (HCC) 07/21/2015   COPD exacerbation (HCC) 07/21/2015   Acute renal insufficiency 07/21/2015   Hyperglycemia 07/21/2015   HTN (hypertension) 07/21/2015   S/P shoulder replacement 07/02/2013    PCP: Fernande Ophelia JINNY DOUGLAS, MD  REFERRING PROVIDER: Fernande Ophelia JINNY DOUGLAS, MD  REFERRING DIAG: [phone] (ICD-10-CM) - Other intervertebral disc degeneration, lumbar region with discogenic back pain and lower extremity pain   Rationale for Evaluation and Treatment: Rehabilitation  THERAPY DIAG:  Difficulty in walking, not elsewhere  classified  Muscle weakness (generalized)  Other low back pain  Unsteadiness on feet  Imbalance  Gait difficulty  Abnormal posture  ONSET DATE: 03/20/2019  SUBJECTIVE:                                                                                                                                                                                           SUBJECTIVE STATEMENT: Pt. Reports chronic h/o lumbar stenosis/ back pain.  Pt. States approximately 3 weeks ago she experienced an increase in B LE muscle weakness in legs.  Pt. Reports 8/10 low back pain first thing in the morning and uses heating pad/ ibuprofen  to decrease back pain to 1/10 after about 2 hours.  Pt. Reports no recent falls and using elevated RW for short distance ambulation and w/c for community and some household mobility.  Pt. States she has poor standing/walking endurance and is very slow with movement, which is why she requires use of w/c.  Pt. Known well to PT clinic.     PERTINENT HISTORY:  See MD notes.    PAIN:  Are you having pain? Yes: NPRS scale: 2/10 Pain location: low back Pain description: aching Aggravating factors: lying supine/ getting out of bed.  Relieving factors: moist heat  PRECAUTIONS: Fall  RED FLAGS: None   WEIGHT BEARING RESTRICTIONS: No  FALLS:  Has patient fallen in last 6 months? No  LIVING ENVIRONMENT: Lives with: lives with their spouse Lives in: House/apartment Stairs: No Has following equipment at home: Environmental consultant - 2 wheeled, Wheelchair (manual), and Tour manager  OCCUPATION: Retired  PLOF: Requires assistive device for independence  PATIENT GOALS: Increase standing/walking endurance.  Decrease low back pain and improve LE muscle strength.  NEXT MD VISIT: PRN  OBJECTIVE:  Note: Objective measures were completed at Evaluation unless otherwise noted.  DIAGNOSTIC FINDINGS:  COMPARISON:  Lumbar spine MRI from 06/26/2020   FINDINGS: Normal alignment. The vertebral  body heights are well preserved. No signs of acute fracture or subluxation. Moderate multilevel disc space narrowing and anterior and posterior  spur formation. Facet arthropathy identified within the lower lumbar spine. Aortic atherosclerosis. Moderate stool burden noted within the colon.   IMPRESSION: 1. No acute findings. 2. Moderate multilevel degenerative disc disease and facet arthropathy.  PATIENT SURVEYS:  FOTO initial 40/ goal 59  SCREENING FOR RED FLAGS: Bowel or bladder incontinence: yes Spinal tumors: No Cauda equina syndrome: No Compression fracture: Yes (mid-thoracic spine). Abdominal aneurysm: No  COGNITION: Overall cognitive status: Within functional limits for tasks assessed   SENSATION: WFL, moderate lower leg swelling noted.  MUSCLE LENGTH: No supine positioning today Hamstrings (distal): R 105 deg./ L 113 deg. Thomas test: NT   POSTURE: rounded shoulders, forward head, increased thoracic kyphosis, and flexed trunk   PALPATION: Tenderness over lumbar paraspinals (generalized).  LUMBAR ROM:   AROM eval  Flexion 50% limited  Extension 100% limited  Right lateral flexion 50% limited  Left lateral flexion 50% limited  Right rotation 50% limited  Left rotation 50% limited   (Blank rows = not tested)  LOWER EXTREMITY ROM:       B hip flexion/ knee extension limited.  Supine L/R hip flexion (92/ 94 deg.), knee flexion (110/115 deg.), knee extension (-2/ -2 deg.).    LOWER EXTREMITY MMT:       B LE muscle strength grossly 4/5 MMT except hamstring 4+/5 MMT and hip flexion 3+/5 MMT  Ascends steps with L LE and heavy UE assist.   LUMBAR SPECIAL TESTS:  NT  FUNCTIONAL TESTS:  5xSTS: TBD : TBD  GAIT: Distance walked: in clinic with elevated RW Assistive device utilized: Walker - 2 wheeled Level of assistance: Modified independence Comments: Significant forward head/ flexed posture/ thoracic kyphosis.  Slow, controlled gait pattern.  Fear of  L knee buckling with R LE swing through phase of gait.   TODAY'S TREATMENT:                                                                                                                              DATE: 10/29/23    See evaluation.    There.ex.:  Nustep L4 13 min. B UE/LE (consistent cadence)- MH to back.  0.5 miles.  Discussed gym based ex. And daily activity.    Supine LE ex./ standing 3-way hip ex. At //-bars.    PATIENT EDUCATION:  Education details: Daily walking/ posture Person educated: Patient Education method: Medical illustrator Education comprehension: verbalized understanding and returned demonstration  HOME EXERCISE PROGRAM: Will issue next tx. session  ASSESSMENT:  CLINICAL IMPRESSION:    Pt. Is a pleasant 78 y/o female with chronic h/o low back symptoms/ pain and LE muscle weakness.  Patient ambulated into clinic/gym from front of building with significant forward flexed posture and heavy forearm support of upright RW.  Extra time required and PT provided SBA/ cuing to increase recip. Step pattern/ cadence to improve efficiency with gait.   Pt. Cued t/o tx. Session to increase upright posture during seated/standing there.ex. and during  walking.  Pt. Presents with B LE muscle weakness and limited knee control with sit to stands/ wt. Bearing and stance during swing through phase of gait.  Pt. Will benefit from skilled PT services in combination with gym based ex. To improve LE strengthening/ pain-free mobility.   OBJECTIVE IMPAIRMENTS: Abnormal gait, decreased activity tolerance, decreased balance, decreased endurance, decreased mobility, difficulty walking, decreased ROM, decreased strength, hypomobility, increased edema, impaired flexibility, improper body mechanics, postural dysfunction, and pain.   ACTIVITY LIMITATIONS: bending, sitting, standing, squatting, stairs, transfers, bed mobility, toileting, and locomotion level  PARTICIPATION LIMITATIONS:  medication management  PERSONAL FACTORS: Fitness and Past/current experiences are also affecting patient's functional outcome.   REHAB POTENTIAL: Good  CLINICAL DECISION MAKING: Evolving/moderate complexity  EVALUATION COMPLEXITY: Moderate   GOALS: Goals reviewed with patient? Yes  SHORT TERM GOALS: Target date: 11/19/23  Pt. Will be independent with HEP to increase B LE muscle strength 1/2 muscle grade to improve transfer/ safety with walking.  Baseline:  see above Goal status: Initial  2.  Pt. Will complete with no rest breaks to improve walking endurance.   Baseline: TBD.   Goal status: Initial  LONG TERM GOALS: Target date: 12/10/23  Pt. Will complete LEFS and score >40 out of 80 to improve functional mobility.    Baseline: TBD Goal status: INITIAL  2.  Pt. Able to stand from 21# chair height with no UE assist or low back pain to improve transfers/ independence with daily activity.   Baseline: benefits from armrests/ UE assist.  Goal status: INITIAL  3.  Pt. Able to return to gym based ex. Program with no limitations or c/o low back pain.   Baseline: c/o LE muscle weakness Goal status: INITIAL   PLAN:  PT FREQUENCY: 2x/week  PT DURATION: 6 weeks  PLANNED INTERVENTIONS: Therapeutic exercises, Therapeutic activity, Neuromuscular re-education, Balance training, Gait training, Patient/Family education, Self Care, Joint mobilization, DME instructions, Dry Needling, Electrical stimulation, Cryotherapy, Moist heat, and Manual therapy.  PLAN FOR NEXT SESSION: ISSUE HEP    Ozell JAYSON Sero, PT, DPT # [phone] 11/09/2023, 3:13 PM

## 2023-11-09 NOTE — Therapy (Signed)
 OUTPATIENT PHYSICAL THERAPY THORACOLUMBAR TREATMENT  Patient Name: Hannah Powell MRN: [id number] DOB:08-05-1945, 78 y.o., female Today's Date: 11/06/2023  END OF SESSION:  PT End of Session - 11/09/23 1716     Visit Number 2    Number of Visits 12    Date for PT Re-Evaluation 12/10/23    PT Start Time 0733    PT Stop Time 0830    PT Time Calculation (min) 57 min          Past Medical History:  Diagnosis Date   Anxiety    Arthritis    CHF (congestive heart failure) (HCC)    COPD (chronic obstructive pulmonary disease) (HCC)    Dysplastic nevus 04/22/2019   R abdomen parallel to sup umbilicus - mod   Dysplastic nevus 04/22/2019   R low back above waistline - mild    Edema    feet/legs   Hypertension    dr hezzie      Hypothyroidism    Leg weakness, bilateral    Osteoporosis    Oxygen  deficit    2l with bipap at night   RLS (restless legs syndrome)    Shortness of breath    Sleep apnea    bipap   Wheezing    Past Surgical History:  Procedure Laterality Date   BACK SURGERY     cervical   BREAST BIOPSY Left    needle bx-neg   CATARACT EXTRACTION W/PHACO Left 05/07/2017   Procedure: CATARACT EXTRACTION PHACO AND INTRAOCULAR LENS PLACEMENT (IOC);  Surgeon: Jaye Fallow, MD;  Location: [facility];  Service: Ophthalmology;  Laterality: Left;  US  00:48.7 AP% 11.3 CDE 5.46 Fluid Pack Lot # [id number]   CATARACT EXTRACTION W/PHACO Right 04/23/2017   Procedure: CATARACT EXTRACTION PHACO AND INTRAOCULAR LENS PLACEMENT (IOC);  Surgeon: Jaye Fallow, MD;  Location: [facility];  Service: Ophthalmology;  Laterality: Right;  US  00:29 AP% 12.8 CDE 3.79 FLUID PACK LOT # [id number]   CESAREAN SECTION     FOOT ARTHROPLASTY     JOINT REPLACEMENT     x2 tkr   TOTAL SHOULDER ARTHROPLASTY Left 07/02/2013   Procedure: LEFT TOTAL SHOULDER ARTHROPLASTY;  Surgeon: Franky CHRISTELLA Pointer, MD;  Location: [facility];  Service: Orthopedics;  Laterality: Left;   TOTAL SHOULDER  ARTHROPLASTY Right 04/10/2018   Procedure: TOTAL REVERSE SHOULDER ARTHROPLASTY;  Surgeon: Pointer Franky, MD;  Location: [facility];  Service: Orthopedics;  Laterality: Right;    Patient Active Problem List   Diagnosis Date Noted   SUI (stress urinary incontinence, female) 05/03/2023   Overactive bladder 05/03/2023   Bilateral leg pain 08/29/2022   Cervical radiculopathy 08/28/2022   CHF exacerbation (HCC) 08/26/2022   Swelling of lower extremity 08/26/2022   Acute on chronic respiratory failure with hypoxia and hypercapnia (HCC) 03/29/2022   Chronic diastolic CHF (congestive heart failure) (HCC) 03/29/2022   Cellulitis of right lower extremity 03/29/2022   Restless leg syndrome 02/08/2022   Hypothyroidism 02/08/2022   Obesity (BMI 30-39.9) 02/08/2022   Insomnia 02/08/2022   Anxiety 02/08/2022   Poor appetite 02/08/2022   S/P reverse total shoulder arthroplasty, 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   Chronic diastolic heart failure (HCC) 08/09/2016   COPD (chronic obstructive pulmonary disease) (HCC) 08/09/2016   Pulmonary infiltrates    Pneumonia due to Streptococcus pneumoniae (HCC)    Fever    Community acquired pneumonia of right lung    Palliative care by specialist    Goals of care, counseling/discussion    Pressure injury of skin 07/03/2016  Acute respiratory failure with hypoxia (HCC) 07/02/2016   Acute on chronic diastolic CHF (congestive heart failure) (HCC) 07/21/2015   COPD exacerbation (HCC) 07/21/2015   Acute renal insufficiency 07/21/2015   Hyperglycemia 07/21/2015   HTN (hypertension) 07/21/2015   S/P shoulder replacement 07/02/2013    PCP: Fernande Ophelia JINNY DOUGLAS, MD  REFERRING PROVIDER: Fernande Ophelia JINNY DOUGLAS, MD  REFERRING DIAG: [phone] (ICD-10-CM) - Other intervertebral disc degeneration, lumbar region with discogenic back pain and lower extremity pain   Rationale for Evaluation and Treatment: Rehabilitation  THERAPY DIAG:  Difficulty in walking, not elsewhere  classified  Muscle weakness (generalized)  Other low back pain  Unsteadiness on feet  Imbalance  Gait difficulty  Abnormal posture  ONSET DATE: 03/20/2019  SUBJECTIVE:                                                                                                                                                                                           SUBJECTIVE STATEMENT: Pt. Reports chronic h/o lumbar stenosis/ back pain.  Pt. States approximately 3 weeks ago she experienced an increase in B LE muscle weakness in legs.  Pt. Reports 8/10 low back pain first thing in the morning and uses heating pad/ ibuprofen  to decrease back pain to 1/10 after about 2 hours.  Pt. Reports no recent falls and using elevated RW for short distance ambulation and w/c for community and some household mobility.  Pt. States she has poor standing/walking endurance and is very slow with movement, which is why she requires use of w/c.  Pt. Known well to PT clinic.     PERTINENT HISTORY:  See MD notes.    PAIN:  Are you having pain? Yes: NPRS scale: 2/10 Pain location: low back Pain description: aching Aggravating factors: lying supine/ getting out of bed.  Relieving factors: moist heat  PRECAUTIONS: Fall  RED FLAGS: None   WEIGHT BEARING RESTRICTIONS: No  FALLS:  Has patient fallen in last 6 months? No  LIVING ENVIRONMENT: Lives with: lives with their spouse Lives in: House/apartment Stairs: No Has following equipment at home: Environmental consultant - 2 wheeled, Wheelchair (manual), and Tour manager  OCCUPATION: Retired  PLOF: Requires assistive device for independence  PATIENT GOALS: Increase standing/walking endurance.  Decrease low back pain and improve LE muscle strength.  NEXT MD VISIT: PRN  OBJECTIVE:  Note: Objective measures were completed at Evaluation unless otherwise noted.  DIAGNOSTIC FINDINGS:  COMPARISON:  Lumbar spine MRI from 06/26/2020   FINDINGS: Normal alignment. The vertebral  body heights are well preserved. No signs of acute fracture or subluxation. Moderate multilevel disc space narrowing and anterior and posterior  spur formation. Facet arthropathy identified within the lower lumbar spine. Aortic atherosclerosis. Moderate stool burden noted within the colon.   IMPRESSION: 1. No acute findings. 2. Moderate multilevel degenerative disc disease and facet arthropathy.  PATIENT SURVEYS:  FOTO initial 40/ goal 63  SCREENING FOR RED FLAGS: Bowel or bladder incontinence: yes Spinal tumors: No Cauda equina syndrome: No Compression fracture: Yes (mid-thoracic spine). Abdominal aneurysm: No  COGNITION: Overall cognitive status: Within functional limits for tasks assessed   SENSATION: WFL, moderate lower leg swelling noted.  MUSCLE LENGTH: No supine positioning today Hamstrings (distal): R 105 deg./ L 113 deg. Thomas test: NT   POSTURE: rounded shoulders, forward head, increased thoracic kyphosis, and flexed trunk   PALPATION: Tenderness over lumbar paraspinals (generalized).  LUMBAR ROM:   AROM eval  Flexion 50% limited  Extension 100% limited  Right lateral flexion 50% limited  Left lateral flexion 50% limited  Right rotation 50% limited  Left rotation 50% limited   (Blank rows = not tested)  LOWER EXTREMITY ROM:       B hip flexion/ knee extension limited.  Supine L/R hip flexion (92/ 94 deg.), knee flexion (110/115 deg.), knee extension (-2/ -2 deg.).    LOWER EXTREMITY MMT:       B LE muscle strength grossly 4/5 MMT except hamstring 4+/5 MMT and hip flexion 3+/5 MMT  Ascends steps with L LE and heavy UE assist.   LUMBAR SPECIAL TESTS:  NT  FUNCTIONAL TESTS:  5xSTS: TBD : TBD  GAIT: Distance walked: in clinic with elevated RW Assistive device utilized: Walker - 2 wheeled Level of assistance: Modified independence Comments: Significant forward head/ flexed posture/ thoracic kyphosis.  Slow, controlled gait pattern.  Fear of  L knee buckling with R LE swing through phase of gait.   TODAY'S TREATMENT:                                                                                                                              DATE: 11/06/23    Subjective: Pt. Entered PT in w/c and c/o 12/10 cervical pain at rest.  Pt. States her neck pain has been high over past couple days.  Pt. Unable to ambulate into PT clinic due to LE muscle weakness/ inability to lock out knees with wt. Bearing.  Pt. Went to [place] [employer] [place] with family last weekend and has not been able to ambulate for past several days.    Manual tx.:  Seated STM to cervical/B UT musculature.  Use of MH to neck while reviewing recent decline in mobility.  Hypervolt to UT/cervical musculature as tolerated.    Reassessment of cervical AROM.    There.act.:  Reassessment of LE muscle strength.  No significant changes as compared to initial evaluation except hip flexion 3-/5.    Sit to stands with mod/max. Assist from PT in //-bars.  Pt. Unable to complete a TKE on L/R and required blocking out of knee during hip  flexion attempt.  No active hip flexion in standing.    Assist to transfer from w/c into passenger side of car.  Heavy UE assist to push up/ pull up into passenger seat with PT assist for LE placement.     PATIENT EDUCATION:  Education details: Daily walking/ posture Person educated: Patient Education method: Medical illustrator Education comprehension: verbalized understanding and returned demonstration  HOME EXERCISE PROGRAM: Will issue next tx. session  ASSESSMENT:  CLINICAL IMPRESSION:    Pts. Functional status limited by mod to max assist to stand in //-bars due to leg weakness/ inability to extend knees.  Pt. Unable to complete standing marching in //-bars with heavy UE assist.  Max assist to safely transfer from w/c to passenger side of car.   Pt. Presents with B LE muscle weakness and limited knee control with sit to stands/ wt.  Bearing and stance during swing through phase of gait.   Pt. Also limited with significant cervical pain/ tightness with slight improvement after STM/ use of MH and Hypervolt.  Pt. Will benefit from skilled PT services in combination with gym based ex. To improve LE strengthening/ pain-free mobility.   OBJECTIVE IMPAIRMENTS: Abnormal gait, decreased activity tolerance, decreased balance, decreased endurance, decreased mobility, difficulty walking, decreased ROM, decreased strength, hypomobility, increased edema, impaired flexibility, improper body mechanics, postural dysfunction, and pain.   ACTIVITY LIMITATIONS: bending, sitting, standing, squatting, stairs, transfers, bed mobility, toileting, and locomotion level  PARTICIPATION LIMITATIONS: medication management  PERSONAL FACTORS: Fitness and Past/current experiences are also affecting patient's functional outcome.   REHAB POTENTIAL: Good  CLINICAL DECISION MAKING: Evolving/moderate complexity  EVALUATION COMPLEXITY: Moderate   GOALS: Goals reviewed with patient? Yes  SHORT TERM GOALS: Target date: 11/19/23  Pt. Will be independent with HEP to increase B LE muscle strength 1/2 muscle grade to improve transfer/ safety with walking.  Baseline:  see above Goal status: Initial  2.  Pt. Will complete with no rest breaks to improve walking endurance.   Baseline: TBD.   Goal status: Initial  LONG TERM GOALS: Target date: 12/10/23  Pt. Will complete LEFS and score >40 out of 80 to improve functional mobility.    Baseline: TBD Goal status: INITIAL  2.  Pt. Able to stand from 21# chair height with no UE assist or low back pain to improve transfers/ independence with daily activity.   Baseline: benefits from armrests/ UE assist.  Goal status: INITIAL  3.  Pt. Able to return to gym based ex. Program with no limitations or c/o low back pain.   Baseline: c/o LE muscle weakness Goal status: INITIAL  PLAN:  PT FREQUENCY:  2x/week  PT DURATION: 6 weeks  PLANNED INTERVENTIONS: Therapeutic exercises, Therapeutic activity, Neuromuscular re-education, Balance training, Gait training, Patient/Family education, Self Care, Joint mobilization, DME instructions, Dry Needling, Electrical stimulation, Cryotherapy, Moist heat, and Manual therapy.  PLAN FOR NEXT SESSION: ISSUE HEP/ check LE muscle strength and sit to stands  Ozell JAYSON Sero, PT, DPT # [phone] 11/09/2023, 5:18 PM

## 2023-11-12 ENCOUNTER — Other Ambulatory Visit: Payer: Self-pay | Admitting: Internal Medicine

## 2023-11-12 ENCOUNTER — Ambulatory Visit
Admission: RE | Admit: 2023-11-12 | Discharge: 2023-11-12 | Disposition: A | Discharge status: Home / Self Care | Source: Ambulatory Visit | Attending: Internal Medicine | Admitting: Internal Medicine

## 2023-11-12 DIAGNOSIS — Z1231 Encounter for screening mammogram for malignant neoplasm of breast: Secondary | ICD-10-CM

## 2023-11-12 DIAGNOSIS — G959 Disease of spinal cord, unspecified: Secondary | ICD-10-CM | POA: Diagnosis present

## 2023-11-13 ENCOUNTER — Encounter: Admitting: Physical Therapy

## 2023-11-15 ENCOUNTER — Ambulatory Visit: Admitting: Physical Therapy

## 2023-11-19 ENCOUNTER — Ambulatory Visit
Admission: RE | Admit: 2023-11-19 | Discharge: 2023-11-19 | Disposition: A | Discharge status: Home / Self Care | Source: Ambulatory Visit | Attending: Internal Medicine | Admitting: Internal Medicine

## 2023-11-19 DIAGNOSIS — Z1231 Encounter for screening mammogram for malignant neoplasm of breast: Secondary | ICD-10-CM | POA: Insufficient documentation

## 2023-11-20 ENCOUNTER — Encounter: Admitting: Physical Therapy

## 2023-11-22 ENCOUNTER — Ambulatory Visit: Admitting: Physical Therapy

## 2023-11-26 ENCOUNTER — Other Ambulatory Visit: Payer: Self-pay

## 2023-11-26 DIAGNOSIS — R2689 Other abnormalities of gait and mobility: Secondary | ICD-10-CM

## 2023-11-26 DIAGNOSIS — M48062 Spinal stenosis, lumbar region with neurogenic claudication: Secondary | ICD-10-CM

## 2023-11-27 ENCOUNTER — Ambulatory Visit: Admitting: Physical Therapy

## 2023-12-04 ENCOUNTER — Ambulatory Visit: Admitting: Physical Therapy

## 2023-12-11 ENCOUNTER — Ambulatory Visit: Admitting: Physical Therapy

## 2023-12-18 ENCOUNTER — Encounter: Admitting: Physical Therapy

## 2023-12-25 ENCOUNTER — Encounter: Admitting: Physical Therapy

## 2024-01-22 ENCOUNTER — Ambulatory Visit
Admission: RE | Admit: 2024-01-22 | Discharge: 2024-01-22 | Disposition: A | Discharge status: Home / Self Care | Source: Ambulatory Visit

## 2024-01-22 DIAGNOSIS — R2689 Other abnormalities of gait and mobility: Secondary | ICD-10-CM | POA: Diagnosis present

## 2024-01-22 DIAGNOSIS — M48062 Spinal stenosis, lumbar region with neurogenic claudication: Secondary | ICD-10-CM | POA: Insufficient documentation

## 2024-03-04 ENCOUNTER — Other Ambulatory Visit: Payer: Self-pay | Admitting: Internal Medicine

## 2024-03-04 DIAGNOSIS — R0789 Other chest pain: Secondary | ICD-10-CM

## 2024-03-05 ENCOUNTER — Ambulatory Visit
Admission: RE | Admit: 2024-03-05 | Discharge: 2024-03-05 | Disposition: A | Source: Ambulatory Visit | Attending: Internal Medicine | Admitting: Internal Medicine

## 2024-03-05 DIAGNOSIS — R0789 Other chest pain: Secondary | ICD-10-CM | POA: Insufficient documentation

## 2024-03-30 ENCOUNTER — Encounter: Payer: Self-pay | Admitting: *Deleted

## 2024-03-30 ENCOUNTER — Ambulatory Visit: Admitting: Physical Therapy

## 2024-03-30 DIAGNOSIS — R2689 Other abnormalities of gait and mobility: Secondary | ICD-10-CM | POA: Diagnosis present

## 2024-03-30 DIAGNOSIS — R269 Unspecified abnormalities of gait and mobility: Secondary | ICD-10-CM | POA: Diagnosis present

## 2024-03-30 DIAGNOSIS — R2681 Unsteadiness on feet: Secondary | ICD-10-CM | POA: Diagnosis present

## 2024-03-30 DIAGNOSIS — R262 Difficulty in walking, not elsewhere classified: Secondary | ICD-10-CM | POA: Insufficient documentation

## 2024-03-30 DIAGNOSIS — M5459 Other low back pain: Secondary | ICD-10-CM | POA: Insufficient documentation

## 2024-03-30 DIAGNOSIS — R293 Abnormal posture: Secondary | ICD-10-CM | POA: Diagnosis present

## 2024-03-30 DIAGNOSIS — M6281 Muscle weakness (generalized): Secondary | ICD-10-CM | POA: Diagnosis present

## 2024-04-05 NOTE — Therapy (Addendum)
 " OUTPATIENT PHYSICAL THERAPY THORACOLUMBAR EVALUATION  Patient Name: Hannah Powell MRN: [id number] DOB:[birth year], 79 y.o., female Today's Date: 03/30/2024  END OF SESSION:  PT End of Session - 04/05/24 0907     Visit Number 1    Number of Visits 12    Date for Recertification  05/11/24    PT Start Time 1033    PT Stop Time 1118    PT Time Calculation (min) 45 min    Equipment Utilized During Treatment Gait belt    Activity Tolerance Patient tolerated treatment well;No increased pain;Patient limited by fatigue    Behavior During Therapy [facility] for tasks assessed/performed          Past Medical History:  Diagnosis Date   Anxiety    Arthritis    CHF (congestive heart failure) (HCC)    COPD (chronic obstructive pulmonary disease) (HCC)    Dysplastic nevus 04/22/2019   R abdomen parallel to sup umbilicus - mod   Dysplastic nevus 04/22/2019   R low back above waistline - mild    Edema    feet/legs   Hypertension    dr hezzie      Hypothyroidism    Leg weakness, bilateral    Osteoporosis    Oxygen  deficit    2l with bipap at night   RLS (restless legs syndrome)    Shortness of breath    Sleep apnea    bipap   Wheezing    Past Surgical History:  Procedure Laterality Date   BACK SURGERY     cervical   BREAST BIOPSY Left    needle bx-neg   CATARACT EXTRACTION W/PHACO Left 05/07/2017   Procedure: CATARACT EXTRACTION PHACO AND INTRAOCULAR LENS PLACEMENT (IOC);  Surgeon: Jaye Fallow, MD;  Location: [facility];  Service: Ophthalmology;  Laterality: Left;  US  00:48.7 AP% 11.3 CDE 5.46 Fluid Pack Lot # [id number] H   CATARACT EXTRACTION W/PHACO Right 04/23/2017   Procedure: CATARACT EXTRACTION PHACO AND INTRAOCULAR LENS PLACEMENT (IOC);  Surgeon: Jaye Fallow, MD;  Location: [facility];  Service: Ophthalmology;  Laterality: Right;  US  00:29 AP% 12.8 CDE 3.79 FLUID PACK LOT # [id number]   CESAREAN SECTION     FOOT ARTHROPLASTY     JOINT REPLACEMENT     x2 tkr    TOTAL SHOULDER ARTHROPLASTY Left 07/02/2013   Procedure: LEFT TOTAL SHOULDER ARTHROPLASTY;  Surgeon: Franky CHRISTELLA Pointer, MD;  Location: [facility];  Service: Orthopedics;  Laterality: Left;   TOTAL SHOULDER ARTHROPLASTY Right 04/10/2018   Procedure: TOTAL REVERSE SHOULDER ARTHROPLASTY;  Surgeon: Pointer Franky, MD;  Location: [facility];  Service: Orthopedics;  Laterality: Right;    Patient Active Problem List   Diagnosis Date Noted   SUI (stress urinary incontinence, female) 05/03/2023   Overactive bladder 05/03/2023   Bilateral leg pain 08/29/2022   Cervical radiculopathy 08/28/2022   CHF exacerbation (HCC) 08/26/2022   Swelling of lower extremity 08/26/2022   Acute on chronic respiratory failure with hypoxia and hypercapnia (HCC) 03/29/2022   Chronic diastolic CHF (congestive heart failure) (HCC) 03/29/2022   Cellulitis of right lower extremity 03/29/2022   Restless leg syndrome 02/08/2022   Hypothyroidism 02/08/2022   Obesity (BMI 30-39.9) 02/08/2022   Insomnia 02/08/2022   Anxiety 02/08/2022   Poor appetite 02/08/2022   S/P reverse total shoulder arthroplasty, 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   Chronic diastolic heart failure (HCC) 08/09/2016   COPD (chronic obstructive pulmonary disease) (HCC) 08/09/2016   Pulmonary infiltrates    Pneumonia due to Streptococcus pneumoniae  Fever    Community acquired pneumonia of right lung    Palliative care by specialist    Goals of care, counseling/discussion    Pressure injury of skin 07/03/2016   Acute respiratory failure with hypoxia (HCC) 07/02/2016   Acute on chronic diastolic CHF (congestive heart failure) (HCC) 07/21/2015   COPD exacerbation (HCC) 07/21/2015   Acute renal insufficiency 07/21/2015   Hyperglycemia 07/21/2015   HTN (hypertension) 07/21/2015   S/P shoulder replacement 07/02/2013    PCP: Fernande Ophelia JINNY DOUGLAS, MD  REFERRING PROVIDER: Roselee Milo Beagle, DO  REFERRING DIAG:   M40.04 (ICD-10-CM) - Postural kyphosis, thoracic region    Rationale for Evaluation and Treatment: Rehabilitation  THERAPY DIAG:  Difficulty in walking, not elsewhere classified  Muscle weakness (generalized)  Other low back pain  Unsteadiness on feet  Imbalance  Gait difficulty  Abnormal posture  ONSET DATE: 03/20/2019  SUBJECTIVE:                                                                                                                                                                                           SUBJECTIVE STATEMENT: Pt. Reports chronic h/o lumbar stenosis/ back pain.  Pt. States approximately 5 months ago she experienced an increase in B LE muscle weakness in legs.  Pt. Reports 5/10 B knee pain (L worse than R) and increase low back pain first thing in the morning and uses heating pad/ ibuprofen  to decrease back pain.  Pt. C/o 2/10 neck pain and presents with significant forward head/ rounded shoulder/ kyphotic posture.  Pt. Reports possible R rib fracture (rib 7?). Pt. Reports no recent falls and using elevated RW for short distance ambulation at home (limited) and w/c for community and household mobility.  Pt. Has not been able to participate with ex./ [employer] [facility] [employer] due to limitations.  Pt. Known well to PT clinic.     PERTINENT HISTORY:  See MD notes.    PAIN:  Are you having pain? Yes: NPRS scale: 2/10 Pain location: cervical/ mid-back Pain description: aching Aggravating factors: lying supine/ getting out of bed.  Relieving factors: moist heat  PRECAUTIONS: Fall  RED FLAGS: None   WEIGHT BEARING RESTRICTIONS: No  FALLS:  Has patient fallen in last 6 months? No  LIVING ENVIRONMENT: Lives with: lives with their spouse Lives in: House/apartment Stairs: No Has following equipment at home: Environmental Consultant - 2 wheeled, Wheelchair (manual), and Tour manager  OCCUPATION: Retired  PLOF: Requires assistive device for independence  PATIENT GOALS: Increase standing/walking endurance.  Decrease back  pain and improve LE muscle strength/ upright posture.  NEXT MD VISIT: PRN  OBJECTIVE:  Note: Objective measures were completed at Evaluation unless otherwise noted.  DIAGNOSTIC FINDINGS:  COMPARISON:  Lumbar spine MRI from 06/26/2020   FINDINGS: Normal alignment. The vertebral body heights are well preserved. No signs of acute fracture or subluxation. Moderate multilevel disc space narrowing and anterior and posterior spur formation. Facet arthropathy identified within the lower lumbar spine. Aortic atherosclerosis. Moderate stool burden noted within the colon.   IMPRESSION: 1. No acute findings. 2. Moderate multilevel degenerative disc disease and facet arthropathy.  PATIENT SURVEYS:  FOTO initial 40/ goal 76  SCREENING FOR RED FLAGS: Bowel or bladder incontinence: yes Spinal tumors: No Cauda equina syndrome: No Compression fracture: Yes (mid-thoracic spine). Abdominal aneurysm: No  COGNITION: Overall cognitive status: Within functional limits for tasks assessed   SENSATION: WFL, moderate lower leg swelling noted.  MUSCLE LENGTH: No supine positioning today Hamstrings (distal): R 105 deg./ L 113 deg. Thomas test: NT   POSTURE: rounded shoulders, forward head, increased thoracic kyphosis, and flexed trunk   PALPATION: Tenderness over lumbar paraspinals (generalized).  Moderate B UT/ upper thoracic paraspinal tenderness and muscle tightness.   LUMBAR ROM:   AROM eval  Flexion 50% limited  Extension 100% limited  Right lateral flexion 50% limited  Left lateral flexion 50% limited  Right rotation 50% limited  Left rotation 50% limited   (Blank rows = not tested)  LOWER EXTREMITY ROM:       B hip flexion/ knee extension limited.  Supine L/R hip flexion (92/ 94 deg.), knee flexion (110/115 deg.), knee extension (-2/ -2 deg.).    LOWER EXTREMITY MMT:       B LE muscle strength grossly 4/5 MMT except hamstring 4/5 MMT and hip flexion 3+/5 MMT  Limited walking  in //-bars today due to pain/ limited step through  LUMBAR SPECIAL TESTS:  NT  FUNCTIONAL TESTS:  5xSTS: TBD : TBD  GAIT: Distance walked: in //-bars Assistive device utilized: Environmental Consultant - 2 wheeled Level of assistance: Modified independence Comments: Significant forward head/ flexed posture/ thoracic kyphosis.  Slow gait pattern.  Fear of L knee buckling with R LE swing through phase of gait.   TODAY'S TREATMENT:                                                                                                                              DATE: 03/30/2024    See evaluation.    PATIENT EDUCATION:  Education details: Daily walking/ posture Person educated: Patient Education method: Medical Illustrator Education comprehension: verbalized understanding and returned demonstration  HOME EXERCISE PROGRAM: Will issue next tx. session  ASSESSMENT:  CLINICAL IMPRESSION:    Pt. Is a pleasant 79 y/o female with chronic h/o back symptoms/ pain, forward head/rounded sh./ thoracic kyphosis and LE muscle weakness.  Patient arrived to PT in w/c and presents with significant forward flexed posture.  Pt. States she is walking limited distances at home with elevated RW/ forearm support. Extra time required and PT  provided CGA to stand and attempt walking in //-bars.  Pt. Cued t/o tx. Session to increase upright posture during seated/standing tasks and during walking.  Pt. Presents with B LE muscle weakness and limited knee control with sit to stands/ wt. Bearing and stance during swing through phase of gait.  Pt. Will benefit from skilled PT services in combination with gym based ex. To improve LE strengthening/ pain-free mobility.   OBJECTIVE IMPAIRMENTS: Abnormal gait, decreased activity tolerance, decreased balance, decreased endurance, decreased mobility, difficulty walking, decreased ROM, decreased strength, hypomobility, increased edema, impaired flexibility, improper body mechanics,  postural dysfunction, and pain.   ACTIVITY LIMITATIONS: bending, sitting, standing, squatting, stairs, transfers, bed mobility, toileting, and locomotion level  PARTICIPATION LIMITATIONS: medication management  PERSONAL FACTORS: Fitness and Past/current experiences are also affecting patient's functional outcome.   REHAB POTENTIAL: Good  CLINICAL DECISION MAKING: Evolving/moderate complexity  EVALUATION COMPLEXITY: Moderate   GOALS: Goals reviewed with patient? Yes  SHORT TERM GOALS: Target date: 04/20/24  Pt. Will be independent with HEP to increase B LE muscle strength 1/2 muscle grade to improve transfer/ safety with walking.  Baseline:  see above Goal status: Initial  2.  Pt. Will complete 5xSTS with UE assist and no rest breaks to improve walking endurance.   Baseline: TBD Goal status: Initial  LONG TERM GOALS: Target date: 05/11/24  Pt. Will increase LEFS >30 out of 80 to improve functional mobility.    Baseline: 14 out of 80 Goal status: INITIAL  2.  Pt. Able to stand from 21# chair height with no UE assist or low back pain to improve transfers/ independence with daily activity.   Baseline: requires armrests/ UE assist.  Goal status: INITIAL  3.  Pt. Able to return to gym based ex. Program with no limitations or c/o low back pain.   Baseline: c/o LE muscle weakness Goal status: INITIAL   PLAN:  PT FREQUENCY: 2x/week  PT DURATION: 6 weeks  PLANNED INTERVENTIONS: Therapeutic exercises, Therapeutic activity, Neuromuscular re-education, Balance training, Gait training, Patient/Family education, Self Care, Joint mobilization, DME instructions, Dry Needling, Electrical stimulation, Cryotherapy, Moist heat, and Manual therapy.  PLAN FOR NEXT SESSION: ISSUE HEP/ 5xSTS with UE assist  Ozell JAYSON Sero, PT, DPT # [id number] 04/05/2024, 9:08 AM  "

## 2024-04-07 ENCOUNTER — Encounter: Payer: Self-pay | Admitting: Physical Therapy

## 2024-04-07 ENCOUNTER — Ambulatory Visit: Admitting: Physical Therapy

## 2024-04-07 DIAGNOSIS — M6281 Muscle weakness (generalized): Secondary | ICD-10-CM

## 2024-04-07 DIAGNOSIS — R293 Abnormal posture: Secondary | ICD-10-CM

## 2024-04-07 DIAGNOSIS — M5459 Other low back pain: Secondary | ICD-10-CM

## 2024-04-07 DIAGNOSIS — R262 Difficulty in walking, not elsewhere classified: Secondary | ICD-10-CM

## 2024-04-07 DIAGNOSIS — R269 Unspecified abnormalities of gait and mobility: Secondary | ICD-10-CM

## 2024-04-07 DIAGNOSIS — R2681 Unsteadiness on feet: Secondary | ICD-10-CM

## 2024-04-07 DIAGNOSIS — R2689 Other abnormalities of gait and mobility: Secondary | ICD-10-CM

## 2024-04-07 NOTE — Therapy (Signed)
 " OUTPATIENT PHYSICAL THERAPY THORACOLUMBAR TREATMENT  Patient Name: Hannah Powell MRN: [id number] DOB:07-Oct-1945, 79 y.o., female Today's Date: 04/08/2024  END OF SESSION:  PT End of Session - 04/07/24 1555     Visit Number 2    Number of Visits 12    Date for Recertification  05/11/24    PT Start Time 1555    PT Stop Time 1638    PT Time Calculation (min) 43 min    Equipment Utilized During Treatment Gait belt    Activity Tolerance Patient tolerated treatment well;No increased pain;Patient limited by fatigue    Behavior During Therapy [facility] for tasks assessed/performed          Past Medical History:  Diagnosis Date   Anxiety    Arthritis    CHF (congestive heart failure) (HCC)    COPD (chronic obstructive pulmonary disease) (HCC)    Dysplastic nevus 04/22/2019   R abdomen parallel to sup umbilicus - mod   Dysplastic nevus 04/22/2019   R low back above waistline - mild    Edema    feet/legs   Hypertension    dr hezzie      Hypothyroidism    Leg weakness, bilateral    Osteoporosis    Oxygen  deficit    2l with bipap at night   RLS (restless legs syndrome)    Shortness of breath    Sleep apnea    bipap   Wheezing    Past Surgical History:  Procedure Laterality Date   BACK SURGERY     cervical   BREAST BIOPSY Left    needle bx-neg   CATARACT EXTRACTION W/PHACO Left 05/07/2017   Procedure: CATARACT EXTRACTION PHACO AND INTRAOCULAR LENS PLACEMENT (IOC);  Surgeon: Jaye Fallow, MD;  Location: [facility];  Service: Ophthalmology;  Laterality: Left;  US  00:48.7 AP% 11.3 CDE 5.46 Fluid Pack Lot # [id number]   CATARACT EXTRACTION W/PHACO Right 04/23/2017   Procedure: CATARACT EXTRACTION PHACO AND INTRAOCULAR LENS PLACEMENT (IOC);  Surgeon: Jaye Fallow, MD;  Location: [facility];  Service: Ophthalmology;  Laterality: Right;  US  00:29 AP% 12.8 CDE 3.79 FLUID PACK LOT # [id number]   CESAREAN SECTION     FOOT ARTHROPLASTY     JOINT REPLACEMENT     x2 tkr    TOTAL SHOULDER ARTHROPLASTY Left 07/02/2013   Procedure: LEFT TOTAL SHOULDER ARTHROPLASTY;  Surgeon: Franky CHRISTELLA Pointer, MD;  Location: [facility];  Service: Orthopedics;  Laterality: Left;   TOTAL SHOULDER ARTHROPLASTY Right 04/10/2018   Procedure: TOTAL REVERSE SHOULDER ARTHROPLASTY;  Surgeon: Pointer Franky, MD;  Location: [facility];  Service: Orthopedics;  Laterality: Right;    Patient Active Problem List   Diagnosis Date Noted   SUI (stress urinary incontinence, female) 05/03/2023   Overactive bladder 05/03/2023   Bilateral leg pain 08/29/2022   Cervical radiculopathy 08/28/2022   CHF exacerbation (HCC) 08/26/2022   Swelling of lower extremity 08/26/2022   Acute on chronic respiratory failure with hypoxia and hypercapnia (HCC) 03/29/2022   Chronic diastolic CHF (congestive heart failure) (HCC) 03/29/2022   Cellulitis of right lower extremity 03/29/2022   Restless leg syndrome 02/08/2022   Hypothyroidism 02/08/2022   Obesity (BMI 30-39.9) 02/08/2022   Insomnia 02/08/2022   Anxiety 02/08/2022   Poor appetite 02/08/2022   S/P reverse total shoulder arthroplasty, 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   Chronic diastolic heart failure (HCC) 08/09/2016   COPD (chronic obstructive pulmonary disease) (HCC) 08/09/2016   Pulmonary infiltrates    Pneumonia due to Streptococcus pneumoniae  Fever    Community acquired pneumonia of right lung    Palliative care by specialist    Goals of care, counseling/discussion    Pressure injury of skin 07/03/2016   Acute respiratory failure with hypoxia (HCC) 07/02/2016   Acute on chronic diastolic CHF (congestive heart failure) (HCC) 07/21/2015   COPD exacerbation (HCC) 07/21/2015   Acute renal insufficiency 07/21/2015   Hyperglycemia 07/21/2015   HTN (hypertension) 07/21/2015   S/P shoulder replacement 07/02/2013    PCP: Fernande Ophelia JINNY DOUGLAS, MD  REFERRING PROVIDER: Roselee Milo Beagle, DO  REFERRING DIAG:   M40.04 (ICD-10-CM) - Postural kyphosis, thoracic region    Rationale for Evaluation and Treatment: Rehabilitation  THERAPY DIAG:  Difficulty in walking, not elsewhere classified  Muscle weakness (generalized)  Other low back pain  Unsteadiness on feet  Imbalance  Gait difficulty  Abnormal posture  ONSET DATE: 03/20/2019  SUBJECTIVE:                                                                                                                                                                                           SUBJECTIVE STATEMENT: Pt. Reports chronic h/o lumbar stenosis/ back pain.  Pt. States approximately 5 months ago she experienced an increase in B LE muscle weakness in legs.  Pt. Reports 5/10 B knee pain (L worse than R) and increase low back pain first thing in the morning and uses heating pad/ ibuprofen  to decrease back pain.  Pt. C/o 2/10 neck pain and presents with significant forward head/ rounded shoulder/ kyphotic posture.  Pt. Reports possible R rib fracture (rib 7?). Pt. Reports no recent falls and using elevated RW for short distance ambulation at home (limited) and w/c for community and household mobility.  Pt. Has not been able to participate with ex./ [employer] [facility] [employer] due to limitations.  Pt. Known well to PT clinic.     PERTINENT HISTORY:  See MD notes.    PAIN:  Are you having pain? Yes: NPRS scale: 2/10 Pain location: cervical/ mid-back Pain description: aching Aggravating factors: lying supine/ getting out of bed.  Relieving factors: moist heat  PRECAUTIONS: Fall  RED FLAGS: None   WEIGHT BEARING RESTRICTIONS: No  FALLS:  Has patient fallen in last 6 months? No  LIVING ENVIRONMENT: Lives with: lives with their spouse Lives in: House/apartment Stairs: No Has following equipment at home: Environmental Consultant - 2 wheeled, Wheelchair (manual), and Tour manager  OCCUPATION: Retired  PLOF: Requires assistive device for independence  PATIENT GOALS: Increase standing/walking endurance.  Decrease back  pain and improve LE muscle strength/ upright posture.  NEXT MD VISIT: PRN  OBJECTIVE:  Note: Objective measures were completed at Evaluation unless otherwise noted.  DIAGNOSTIC FINDINGS:  COMPARISON:  Lumbar spine MRI from 06/26/2020   FINDINGS: Normal alignment. The vertebral body heights are well preserved. No signs of acute fracture or subluxation. Moderate multilevel disc space narrowing and anterior and posterior spur formation. Facet arthropathy identified within the lower lumbar spine. Aortic atherosclerosis. Moderate stool burden noted within the colon.   IMPRESSION: 1. No acute findings. 2. Moderate multilevel degenerative disc disease and facet arthropathy.  PATIENT SURVEYS:  FOTO initial 40/ goal 41  SCREENING FOR RED FLAGS: Bowel or bladder incontinence: yes Spinal tumors: No Cauda equina syndrome: No Compression fracture: Yes (mid-thoracic spine). Abdominal aneurysm: No  COGNITION: Overall cognitive status: Within functional limits for tasks assessed   SENSATION: WFL, moderate lower leg swelling noted.  MUSCLE LENGTH: No supine positioning today Hamstrings (distal): R 105 deg./ L 113 deg. Thomas test: NT   POSTURE: rounded shoulders, forward head, increased thoracic kyphosis, and flexed trunk   PALPATION: Tenderness over lumbar paraspinals (generalized).  Moderate B UT/ upper thoracic paraspinal tenderness and muscle tightness.   LUMBAR ROM:   AROM eval  Flexion 50% limited  Extension 100% limited  Right lateral flexion 50% limited  Left lateral flexion 50% limited  Right rotation 50% limited  Left rotation 50% limited   (Blank rows = not tested)  LOWER EXTREMITY ROM:       B hip flexion/ knee extension limited.  Supine L/R hip flexion (92/ 94 deg.), knee flexion (110/115 deg.), knee extension (-2/ -2 deg.).    LOWER EXTREMITY MMT:       B LE muscle strength grossly 4/5 MMT except hamstring 4/5 MMT and hip flexion 3+/5 MMT  Limited walking  in //-bars today due to pain/ limited step through  LUMBAR SPECIAL TESTS:  NT  FUNCTIONAL TESTS:  5xSTS: TBD : TBD  GAIT: Distance walked: in //-bars Assistive device utilized: Environmental Consultant - 2 wheeled Level of assistance: Modified independence Comments: Significant forward head/ flexed posture/ thoracic kyphosis.  Slow gait pattern.  Fear of L knee buckling with R LE swing through phase of gait.   TODAY'S TREATMENT:                                                                                                                              DATE: 04/08/2024    Subjective:  Pt. States she is doing okay today.  No complaints of pain at this time.  Pt. Limited with standing/ walking at home and brought in personal forearm RW.  Pt. Arrived in personal w/c with significant forward head/ kyphotic posture.    There.act.:    Sit to stands from w/c into //-bars 6x (standing wt. Shifting with varying UE assist on //-bars).  Walking in //-bars with min.-mod. A and cuing for proper step pattern/ L knee control (fear of buckling)- multiple laps with cuing for posture/ head position correction.    Walking in //-bars  with use of upright/ forearm RW to assess gait pattern/ BOS/ step pattern.   Improved gait with use of forearm walking as compared to //-bars.    Standing transfer from w/c into passenger side of car with mod. A to manage door/ w/c.  Good safety awareness and extra time to manage LE into passenger side of car.     PATIENT EDUCATION:  Education details: Daily walking/ posture Person educated: Patient Education method: Medical Illustrator Education comprehension: verbalized understanding and returned demonstration  HOME EXERCISE PROGRAM: Will issue next tx. session  ASSESSMENT:  CLINICAL IMPRESSION:    Pt. arrived to PT in w/c and presents with significant forward flexed posture.  Extra time required and PT provided CGA to stand and attempt walking in //-bars.  Pt. Cued  t/o tx. Session to increase upright posture during seated/standing tasks and during walking.  Pt. Presents with B LE muscle weakness and limited knee control with sit to stands/ wt. Bearing and stance during swing through phase of gait.  Pt. Ambulates better with use of forearm RW as compared to //-bars.  Pt. Will benefit from skilled PT services in combination with gym based ex. To improve LE strengthening/ pain-free mobility.   OBJECTIVE IMPAIRMENTS: Abnormal gait, decreased activity tolerance, decreased balance, decreased endurance, decreased mobility, difficulty walking, decreased ROM, decreased strength, hypomobility, increased edema, impaired flexibility, improper body mechanics, postural dysfunction, and pain.   ACTIVITY LIMITATIONS: bending, sitting, standing, squatting, stairs, transfers, bed mobility, toileting, and locomotion level  PARTICIPATION LIMITATIONS: medication management  PERSONAL FACTORS: Fitness and Past/current experiences are also affecting patient's functional outcome.   REHAB POTENTIAL: Good  CLINICAL DECISION MAKING: Evolving/moderate complexity  EVALUATION COMPLEXITY: Moderate   GOALS: Goals reviewed with patient? Yes  SHORT TERM GOALS: Target date: 04/20/24  Pt. Will be independent with HEP to increase B LE muscle strength 1/2 muscle grade to improve transfer/ safety with walking.  Baseline:  see above Goal status: Initial  2.  Pt. Will complete 5xSTS with UE assist and no rest breaks to improve walking endurance.   Baseline: TBD Goal status: Initial  LONG TERM GOALS: Target date: 05/11/24  Pt. Will increase LEFS >30 out of 80 to improve functional mobility.    Baseline: 14 out of 80 Goal status: INITIAL  2.  Pt. Able to stand from 21# chair height with no UE assist or low back pain to improve transfers/ independence with daily activity.   Baseline: requires armrests/ UE assist.  Goal status: INITIAL  3.  Pt. Able to return to gym based ex. Program  with no limitations or c/o low back pain.   Baseline: c/o LE muscle weakness Goal status: INITIAL   PLAN:  PT FREQUENCY: 2x/week  PT DURATION: 6 weeks  PLANNED INTERVENTIONS: Therapeutic exercises, Therapeutic activity, Neuromuscular re-education, Balance training, Gait training, Patient/Family education, Self Care, Joint mobilization, DME instructions, Dry Needling, Electrical stimulation, Cryotherapy, Moist heat, and Manual therapy.  PLAN FOR NEXT SESSION: ISSUE HEP/ 5xSTS with UE assist  Ozell JAYSON Sero, PT, DPT # [phone] 04/08/2024, 3:39 PM  "

## 2024-04-14 ENCOUNTER — Ambulatory Visit: Admitting: Physical Therapy

## 2024-04-16 ENCOUNTER — Ambulatory Visit: Admitting: Physical Therapy

## 2024-04-16 ENCOUNTER — Encounter: Payer: Self-pay | Admitting: Physical Therapy

## 2024-04-16 DIAGNOSIS — M5459 Other low back pain: Secondary | ICD-10-CM

## 2024-04-16 DIAGNOSIS — M6281 Muscle weakness (generalized): Secondary | ICD-10-CM

## 2024-04-16 DIAGNOSIS — R262 Difficulty in walking, not elsewhere classified: Secondary | ICD-10-CM | POA: Diagnosis not present

## 2024-04-16 DIAGNOSIS — R269 Unspecified abnormalities of gait and mobility: Secondary | ICD-10-CM

## 2024-04-16 DIAGNOSIS — R2689 Other abnormalities of gait and mobility: Secondary | ICD-10-CM

## 2024-04-16 DIAGNOSIS — R2681 Unsteadiness on feet: Secondary | ICD-10-CM

## 2024-04-16 DIAGNOSIS — R293 Abnormal posture: Secondary | ICD-10-CM

## 2024-04-16 NOTE — Therapy (Signed)
 " OUTPATIENT PHYSICAL THERAPY THORACOLUMBAR TREATMENT  Patient Name: Hannah Powell MRN: [id number] DOB:[birth year], 79 y.o., female Today's Date: 04/16/2024  END OF SESSION:  PT End of Session - 04/16/24 1045     Visit Number 3    Number of Visits 12    Date for Recertification  05/11/24    PT Start Time 0942    PT Stop Time 1040    PT Time Calculation (min) 58 min    Equipment Utilized During Treatment Gait belt    Activity Tolerance Patient tolerated treatment well;No increased pain;Patient limited by fatigue    Behavior During Therapy [facility] for tasks assessed/performed         Past Medical History:  Diagnosis Date   Anxiety    Arthritis    CHF (congestive heart failure) (HCC)    COPD (chronic obstructive pulmonary disease) (HCC)    Dysplastic nevus 04/22/2019   R abdomen parallel to sup umbilicus - mod   Dysplastic nevus 04/22/2019   R low back above waistline - mild    Edema    feet/legs   Hypertension    dr hezzie      Hypothyroidism    Leg weakness, bilateral    Osteoporosis    Oxygen  deficit    2l with bipap at night   RLS (restless legs syndrome)    Shortness of breath    Sleep apnea    bipap   Wheezing    Past Surgical History:  Procedure Laterality Date   BACK SURGERY     cervical   BREAST BIOPSY Left    needle bx-neg   CATARACT EXTRACTION W/PHACO Left 05/07/2017   Procedure: CATARACT EXTRACTION PHACO AND INTRAOCULAR LENS PLACEMENT (IOC);  Surgeon: Jaye Fallow, MD;  Location: [facility] ORS;  Service: Ophthalmology;  Laterality: Left;  US  00:48.7 AP% 11.3 CDE 5.46 Fluid Pack Lot # [id number]   CATARACT EXTRACTION W/PHACO Right 04/23/2017   Procedure: CATARACT EXTRACTION PHACO AND INTRAOCULAR LENS PLACEMENT (IOC);  Surgeon: Jaye Fallow, MD;  Location: [facility] ORS;  Service: Ophthalmology;  Laterality: Right;  US  00:29 AP% 12.8 CDE 3.79 FLUID PACK LOT # [id number]   CESAREAN SECTION     FOOT ARTHROPLASTY     JOINT REPLACEMENT     x2 tkr    TOTAL SHOULDER ARTHROPLASTY Left 07/02/2013   Procedure: LEFT TOTAL SHOULDER ARTHROPLASTY;  Surgeon: Franky CHRISTELLA Pointer, MD;  Location: [facility] OR;  Service: Orthopedics;  Laterality: Left;   TOTAL SHOULDER ARTHROPLASTY Right 04/10/2018   Procedure: TOTAL REVERSE SHOULDER ARTHROPLASTY;  Surgeon: Pointer Franky, MD;  Location: [facility] OR[facility];  Service: Orthopedics;  Laterality: Right;    Patient Active Problem List   Diagnosis Date Noted   SUI (stress urinary incontinence, female) 05/03/2023   Overactive bladder 05/03/2023   Bilateral leg pain 08/29/2022   Cervical radiculopathy 08/28/2022   CHF exacerbation (HCC) 08/26/2022   Swelling of lower extremity 08/26/2022   Acute on chronic respiratory failure with hypoxia and hypercapnia (HCC) 03/29/2022   Chronic diastolic CHF (congestive heart failure) (HCC) 03/29/2022   Cellulitis of right lower extremity 03/29/2022   Restless leg syndrome 02/08/2022   Hypothyroidism 02/08/2022   Obesity (BMI 30-39.9) 02/08/2022   Insomnia 02/08/2022   Anxiety 02/08/2022   Poor appetite 02/08/2022   S/P reverse total shoulder arthroplasty, 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   Chronic diastolic heart failure (HCC) 08/09/2016   COPD (chronic obstructive pulmonary disease) (HCC) 08/09/2016   Pulmonary infiltrates    Pneumonia due to Streptococcus pneumoniae  Fever    Community acquired pneumonia of right lung    Palliative care by specialist    Goals of care, counseling/discussion    Pressure injury of skin 07/03/2016   Acute respiratory failure with hypoxia (HCC) 07/02/2016   Acute on chronic diastolic CHF (congestive heart failure) (HCC) 07/21/2015   COPD exacerbation (HCC) 07/21/2015   Acute renal insufficiency 07/21/2015   Hyperglycemia 07/21/2015   HTN (hypertension) 07/21/2015   S/P shoulder replacement 07/02/2013    PCP: Fernande Ophelia JINNY DOUGLAS, MD  REFERRING PROVIDER: Roselee Milo Beagle, DO  REFERRING DIAG:   M40.04 (ICD-10-CM) - Postural kyphosis, thoracic region    Rationale for Evaluation and Treatment: Rehabilitation  THERAPY DIAG:  Difficulty in walking, not elsewhere classified  Muscle weakness (generalized)  Other low back pain  Unsteadiness on feet  Imbalance  Gait difficulty  Abnormal posture  ONSET DATE: 03/20/2019  SUBJECTIVE:                                                                                                                                                                                           SUBJECTIVE STATEMENT: Pt. Reports chronic h/o lumbar stenosis/ back pain.  Pt. States approximately 5 months ago she experienced an increase in B LE muscle weakness in legs.  Pt. Reports 5/10 B knee pain (L worse than R) and increase low back pain first thing in the morning and uses heating pad/ ibuprofen  to decrease back pain.  Pt. C/o 2/10 neck pain and presents with significant forward head/ rounded shoulder/ kyphotic posture.  Pt. Reports possible R rib fracture (rib 7?). Pt. Reports no recent falls and using elevated RW for short distance ambulation at home (limited) and w/c for community and household mobility.  Pt. Has not been able to participate with ex./ [employer] due to limitations.  Pt. Known well to PT clinic.     PERTINENT HISTORY:  See MD notes.    PAIN:  Are you having pain? Yes: NPRS scale: 2/10 Pain location: cervical/ mid-back Pain description: aching Aggravating factors: lying supine/ getting out of bed.  Relieving factors: moist heat  PRECAUTIONS: Fall  RED FLAGS: None   WEIGHT BEARING RESTRICTIONS: No  FALLS:  Has patient fallen in last 6 months? No  LIVING ENVIRONMENT: Lives with: lives with their spouse Lives in: House/apartment Stairs: No Has following equipment at home: Environmental Consultant - 2 wheeled, Wheelchair (manual), and Tour manager  OCCUPATION: Retired  PLOF: Requires assistive device for independence  PATIENT GOALS: Increase standing/walking endurance.  Decrease back  pain and improve LE muscle strength/ upright posture.  NEXT MD VISIT: PRN  OBJECTIVE:  Note: Objective measures were completed at Evaluation unless otherwise noted.  DIAGNOSTIC FINDINGS:  COMPARISON:  Lumbar spine MRI from 06/26/2020   FINDINGS: Normal alignment. The vertebral body heights are well preserved. No signs of acute fracture or subluxation. Moderate multilevel disc space narrowing and anterior and posterior spur formation. Facet arthropathy identified within the lower lumbar spine. Aortic atherosclerosis. Moderate stool burden noted within the colon.   IMPRESSION: 1. No acute findings. 2. Moderate multilevel degenerative disc disease and facet arthropathy.  PATIENT SURVEYS:  FOTO initial 40/ goal 51  SCREENING FOR RED FLAGS: Bowel or bladder incontinence: yes Spinal tumors: No Cauda equina syndrome: No Compression fracture: Yes (mid-thoracic spine). Abdominal aneurysm: No  COGNITION: Overall cognitive status: Within functional limits for tasks assessed   SENSATION: WFL, moderate lower leg swelling noted.  MUSCLE LENGTH: No supine positioning today Hamstrings (distal): R 105 deg./ L 113 deg. Thomas test: NT   POSTURE: rounded shoulders, forward head, increased thoracic kyphosis, and flexed trunk   PALPATION: Tenderness over lumbar paraspinals (generalized).  Moderate B UT/ upper thoracic paraspinal tenderness and muscle tightness.   LUMBAR ROM:   AROM eval  Flexion 50% limited  Extension 100% limited  Right lateral flexion 50% limited  Left lateral flexion 50% limited  Right rotation 50% limited  Left rotation 50% limited   (Blank rows = not tested)  LOWER EXTREMITY ROM:       B hip flexion/ knee extension limited.  Supine L/R hip flexion (92/ 94 deg.), knee flexion (110/115 deg.), knee extension (-2/ -2 deg.).    LOWER EXTREMITY MMT:       B LE muscle strength grossly 4/5 MMT except hamstring 4/5 MMT and hip flexion 3+/5 MMT  Limited walking  in //-bars today due to pain/ limited step through  LUMBAR SPECIAL TESTS:  NT  FUNCTIONAL TESTS:  5xSTS: TBD : TBD  GAIT: Distance walked: in //-bars Assistive device utilized: Environmental Consultant - 2 wheeled Level of assistance: Modified independence Comments: Significant forward head/ flexed posture/ thoracic kyphosis.  Slow gait pattern.  Fear of L knee buckling with R LE swing through phase of gait.   TODAY'S TREATMENT:                                                                                                                              DATE: 04/16/2024    Subjective:   Pt states that she is doing good today. No c/o pain in lower back. Pt presented with forearm RW and was able to walk from car to front desk and was then transferred to w/c to go into PT gym. Pt presents with significant forward head/ kyphotic posture. Pt also states L knee is feeling weak and will not straighten out but has no pain.     There.act.:    // bars: forwards/ backwards walking 1 lap total (with UE assist/ CGA).  Focus on upright posture and straightening/ confidence in L knee. Limited by  fatigue in backwards walking. Walking in // bars with forearm RW 1 lap (focusing on extending on L knee/ quad control)  Walk from //-bars to [place] with forearm RW ~ 21 ft. with forearm RW from [place]ilus to stairs ~ 19 ft (With CGA). Pt cued to increase upright posture/ head position and focus on L heel strike/ knee extension with wt. Bearing to improve R swing through phase of gait.  Pt. Limited with step length and confidence on L LE.    Nautilus: seated lat pulldowns 30# 2x (fatigue).  Focusing on upright posture and opening up spine. Hold at end of set.   Stair taps 1x8 (with UE assist and CGA) (Pt cued to straight L knee and was assisted by PT to keep knee straight, Limited by fatigue).  Seated hamstrings stretches at 1st step 3x30 sec.   Manual cuing/ feedback.    Nu-step 10 min level 4 for LE strength and  conditioning.     PATIENT EDUCATION:  Education details: Daily walking/ posture Person educated: Patient Education method: Medical Illustrator Education comprehension: verbalized understanding and returned demonstration  HOME EXERCISE PROGRAM: Will issue next tx. session  ASSESSMENT:  CLINICAL IMPRESSION:    Pt arrived with forearm RW and significant forward flexed posture. Pt did well with ambulating with forearm walker but was often limited by fatigue/ L knee control. Pt. Presents with B LE muscle weakness and limited knee control. Pt was apprehensive to put weight through L knee due to fear of buckling. Pt was cued to correct upright posture and increase step length while bearing weight on L LE. Hamstring stretches did not increase pain and will benefit pt to increase knee extension/ quad control.  Pt. Ambulates better with use of forearm RW as compared to //-bars.  Pt. Will benefit from skilled PT services in combination with gym based ex. To improve LE strengthening/ pain-free mobility.   OBJECTIVE IMPAIRMENTS: Abnormal gait, decreased activity tolerance, decreased balance, decreased endurance, decreased mobility, difficulty walking, decreased ROM, decreased strength, hypomobility, increased edema, impaired flexibility, improper body mechanics, postural dysfunction, and pain.   ACTIVITY LIMITATIONS: bending, sitting, standing, squatting, stairs, transfers, bed mobility, toileting, and locomotion level  PARTICIPATION LIMITATIONS: medication management  PERSONAL FACTORS: Fitness and Past/current experiences are also affecting patient's functional outcome.   REHAB POTENTIAL: Good  CLINICAL DECISION MAKING: Evolving/moderate complexity  EVALUATION COMPLEXITY: Moderate   GOALS: Goals reviewed with patient? Yes  SHORT TERM GOALS: Target date: 04/20/24  Pt. Will be independent with HEP to increase B LE muscle strength 1/2 muscle grade to improve transfer/ safety with  walking.  Baseline:  see above Goal status: Initial  2.  Pt. Will complete 5xSTS with UE assist and no rest breaks to improve walking endurance.   Baseline: TBD Goal status: Initial  LONG TERM GOALS: Target date: 05/11/24  Pt. Will increase LEFS >30 out of 80 to improve functional mobility.    Baseline: 14 out of 80 Goal status: INITIAL  2.  Pt. Able to stand from 21# chair height with no UE assist or low back pain to improve transfers/ independence with daily activity.   Baseline: requires armrests/ UE assist.  Goal status: INITIAL  3.  Pt. Able to return to gym based ex. Program with no limitations or c/o low back pain.   Baseline: c/o LE muscle weakness Goal status: INITIAL  PLAN:  PT FREQUENCY: 2x/week  PT DURATION: 6 weeks  PLANNED INTERVENTIONS: Therapeutic exercises, Therapeutic activity, Neuromuscular re-education, Balance training, Gait training,  Patient/Family education, Self Care, Joint mobilization, DME instructions, Dry Needling, Electrical stimulation, Cryotherapy, Moist heat, and Manual therapy.  PLAN FOR NEXT SESSION: ISSUE HEP/ 5xSTS with UE assist  Ozell JAYSON Sero, PT, DPT # [phone] [place] Gainer, SPT 04/16/2024, 1:15 PM  "

## 2024-04-21 ENCOUNTER — Encounter: Payer: Self-pay | Admitting: Physical Therapy

## 2024-04-21 ENCOUNTER — Ambulatory Visit: Admitting: Physical Therapy

## 2024-04-21 DIAGNOSIS — M6281 Muscle weakness (generalized): Secondary | ICD-10-CM

## 2024-04-21 DIAGNOSIS — R293 Abnormal posture: Secondary | ICD-10-CM

## 2024-04-21 DIAGNOSIS — R269 Unspecified abnormalities of gait and mobility: Secondary | ICD-10-CM

## 2024-04-21 DIAGNOSIS — R262 Difficulty in walking, not elsewhere classified: Secondary | ICD-10-CM

## 2024-04-21 DIAGNOSIS — R2689 Other abnormalities of gait and mobility: Secondary | ICD-10-CM

## 2024-04-21 DIAGNOSIS — R2681 Unsteadiness on feet: Secondary | ICD-10-CM

## 2024-04-21 DIAGNOSIS — M5459 Other low back pain: Secondary | ICD-10-CM

## 2024-04-23 ENCOUNTER — Ambulatory Visit: Admitting: Physical Therapy

## 2024-04-24 ENCOUNTER — Ambulatory Visit: Admitting: Physical Therapy

## 2024-04-24 DIAGNOSIS — R2681 Unsteadiness on feet: Secondary | ICD-10-CM

## 2024-04-24 DIAGNOSIS — M5459 Other low back pain: Secondary | ICD-10-CM

## 2024-04-24 DIAGNOSIS — R2689 Other abnormalities of gait and mobility: Secondary | ICD-10-CM

## 2024-04-24 DIAGNOSIS — R293 Abnormal posture: Secondary | ICD-10-CM

## 2024-04-24 DIAGNOSIS — R269 Unspecified abnormalities of gait and mobility: Secondary | ICD-10-CM

## 2024-04-24 DIAGNOSIS — R262 Difficulty in walking, not elsewhere classified: Secondary | ICD-10-CM

## 2024-04-24 DIAGNOSIS — M6281 Muscle weakness (generalized): Secondary | ICD-10-CM

## 2024-04-24 NOTE — Therapy (Signed)
 " OUTPATIENT PHYSICAL THERAPY THORACOLUMBAR TREATMENT  Patient Name: Hannah Powell MRN: [id number] DOB:05-05-1945, 79 y.o., female Today's Date: 04/24/2024  END OF SESSION:  PT End of Session - 04/24/24 1039     Visit Number 5    Number of Visits 12    Date for Recertification  05/11/24    PT Start Time 1025    PT Stop Time 1132    PT Time Calculation (min) 67 min    Equipment Utilized During Treatment Gait belt    Activity Tolerance Patient tolerated treatment well;No increased pain;Patient limited by fatigue    Behavior During Therapy [facility] for tasks assessed/performed          Past Medical History:  Diagnosis Date   Anxiety    Arthritis    CHF (congestive heart failure) (HCC)    COPD (chronic obstructive pulmonary disease) (HCC)    Dysplastic nevus 04/22/2019   R abdomen parallel to sup umbilicus - mod   Dysplastic nevus 04/22/2019   R low back above waistline - mild    Edema    feet/legs   Hypertension    dr hezzie      Hypothyroidism    Leg weakness, bilateral    Osteoporosis    Oxygen  deficit    2l with bipap at night   RLS (restless legs syndrome)    Shortness of breath    Sleep apnea    bipap   Wheezing    Past Surgical History:  Procedure Laterality Date   BACK SURGERY     cervical   BREAST BIOPSY Left    needle bx-neg   CATARACT EXTRACTION W/PHACO Left 05/07/2017   Procedure: CATARACT EXTRACTION PHACO AND INTRAOCULAR LENS PLACEMENT (IOC);  Surgeon: Jaye Fallow, MD;  Location: [facility];  Service: Ophthalmology;  Laterality: Left;  US  00:48.7 AP% 11.3 CDE 5.46 Fluid Pack Lot # [id number]   CATARACT EXTRACTION W/PHACO Right 04/23/2017   Procedure: CATARACT EXTRACTION PHACO AND INTRAOCULAR LENS PLACEMENT (IOC);  Surgeon: Jaye Fallow, MD;  Location: [facility];  Service: Ophthalmology;  Laterality: Right;  US  00:29 AP% 12.8 CDE 3.79 FLUID PACK LOT # [id number]   CESAREAN SECTION     FOOT ARTHROPLASTY     JOINT REPLACEMENT     x2 tkr    TOTAL SHOULDER ARTHROPLASTY Left 07/02/2013   Procedure: LEFT TOTAL SHOULDER ARTHROPLASTY;  Surgeon: Franky CHRISTELLA Pointer, MD;  Location: [facility] OR;  Service: Orthopedics;  Laterality: Left;   TOTAL SHOULDER ARTHROPLASTY Right 04/10/2018   Procedure: TOTAL REVERSE SHOULDER ARTHROPLASTY;  Surgeon: Pointer Franky, MD;  Location: [facility];  Service: Orthopedics;  Laterality: Right;    Patient Active Problem List   Diagnosis Date Noted   SUI (stress urinary incontinence, female) 05/03/2023   Overactive bladder 05/03/2023   Bilateral leg pain 08/29/2022   Cervical radiculopathy 08/28/2022   CHF exacerbation (HCC) 08/26/2022   Swelling of lower extremity 08/26/2022   Acute on chronic respiratory failure with hypoxia and hypercapnia (HCC) 03/29/2022   Chronic diastolic CHF (congestive heart failure) (HCC) 03/29/2022   Cellulitis of right lower extremity 03/29/2022   Restless leg syndrome 02/08/2022   Hypothyroidism 02/08/2022   Obesity (BMI 30-39.9) 02/08/2022   Insomnia 02/08/2022   Anxiety 02/08/2022   Poor appetite 02/08/2022   S/P reverse total shoulder arthroplasty, 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   Chronic diastolic heart failure (HCC) 08/09/2016   COPD (chronic obstructive pulmonary disease) (HCC) 08/09/2016   Pulmonary infiltrates    Pneumonia due to Streptococcus pneumoniae  Fever    Community acquired pneumonia of right lung    Palliative care by specialist    Goals of care, counseling/discussion    Pressure injury of skin 07/03/2016   Acute respiratory failure with hypoxia (HCC) 07/02/2016   Acute on chronic diastolic CHF (congestive heart failure) (HCC) 07/21/2015   COPD exacerbation (HCC) 07/21/2015   Acute renal insufficiency 07/21/2015   Hyperglycemia 07/21/2015   HTN (hypertension) 07/21/2015   S/P shoulder replacement 07/02/2013    PCP: Fernande Ophelia JINNY DOUGLAS, MD  REFERRING PROVIDER: Roselee Milo Beagle, DO  REFERRING DIAG:   M40.04 (ICD-10-CM) - Postural kyphosis, thoracic region    Rationale for Evaluation and Treatment: Rehabilitation  THERAPY DIAG:  Difficulty in walking, not elsewhere classified  Muscle weakness (generalized)  Other low back pain  Unsteadiness on feet  Imbalance  Gait difficulty  Abnormal posture  ONSET DATE: 03/20/2019  SUBJECTIVE:                                                                                                                                                                                           SUBJECTIVE STATEMENT: Pt. Reports chronic h/o lumbar stenosis/ back pain.  Pt. States approximately 5 months ago she experienced an increase in B LE muscle weakness in legs.  Pt. Reports 5/10 B knee pain (L worse than R) and increase low back pain first thing in the morning and uses heating pad/ ibuprofen  to decrease back pain.  Pt. C/o 2/10 neck pain and presents with significant forward head/ rounded shoulder/ kyphotic posture.  Pt. Reports possible R rib fracture (rib 7?). Pt. Reports no recent falls and using elevated RW for short distance ambulation at home (limited) and w/c for community and household mobility.  Pt. Has not been able to participate with ex./ [employer] [facility] [employer] due to limitations.  Pt. Known well to PT clinic.     PERTINENT HISTORY:  See MD notes.    PAIN:  Are you having pain? Yes: NPRS scale: 2/10 Pain location: cervical/ mid-back Pain description: aching Aggravating factors: lying supine/ getting out of bed.  Relieving factors: moist heat  PRECAUTIONS: Fall  RED FLAGS: None   WEIGHT BEARING RESTRICTIONS: No  FALLS:  Has patient fallen in last 6 months? No  LIVING ENVIRONMENT: Lives with: lives with their spouse Lives in: House/apartment Stairs: No Has following equipment at home: Environmental Consultant - 2 wheeled, Wheelchair (manual), and Tour manager  OCCUPATION: Retired  PLOF: Requires assistive device for independence  PATIENT GOALS: Increase standing/walking endurance.  Decrease back  pain and improve LE muscle strength/ upright posture.  NEXT MD VISIT: PRN  OBJECTIVE:  Note: Objective measures were completed at Evaluation unless otherwise noted.  DIAGNOSTIC FINDINGS:  COMPARISON:  Lumbar spine MRI from 06/26/2020   FINDINGS: Normal alignment. The vertebral body heights are well preserved. No signs of acute fracture or subluxation. Moderate multilevel disc space narrowing and anterior and posterior spur formation. Facet arthropathy identified within the lower lumbar spine. Aortic atherosclerosis. Moderate stool burden noted within the colon.   IMPRESSION: 1. No acute findings. 2. Moderate multilevel degenerative disc disease and facet arthropathy.  PATIENT SURVEYS:  FOTO initial 40/ goal 15  SCREENING FOR RED FLAGS: Bowel or bladder incontinence: yes Spinal tumors: No Cauda equina syndrome: No Compression fracture: Yes (mid-thoracic spine). Abdominal aneurysm: No  COGNITION: Overall cognitive status: Within functional limits for tasks assessed   SENSATION: WFL, moderate lower leg swelling noted.  MUSCLE LENGTH: No supine positioning today Hamstrings (distal): R 105 deg./ L 113 deg. Thomas test: NT   POSTURE: rounded shoulders, forward head, increased thoracic kyphosis, and flexed trunk   PALPATION: Tenderness over lumbar paraspinals (generalized).  Moderate B UT/ upper thoracic paraspinal tenderness and muscle tightness.   LUMBAR ROM:   AROM eval  Flexion 50% limited  Extension 100% limited  Right lateral flexion 50% limited  Left lateral flexion 50% limited  Right rotation 50% limited  Left rotation 50% limited   (Blank rows = not tested)  LOWER EXTREMITY ROM:       B hip flexion/ knee extension limited.  Supine L/R hip flexion (92/ 94 deg.), knee flexion (110/115 deg.), knee extension (-2/ -2 deg.).    LOWER EXTREMITY MMT:       B LE muscle strength grossly 4/5 MMT except hamstring 4/5 MMT and hip flexion 3+/5 MMT  Limited walking  in //-bars today due to pain/ limited step through  LUMBAR SPECIAL TESTS:  NT  FUNCTIONAL TESTS:  5xSTS: TBD : TBD  GAIT: Distance walked: in //-bars Assistive device utilized: Environmental Consultant - 2 wheeled Level of assistance: Modified independence Comments: Significant forward head/ flexed posture/ thoracic kyphosis.  Slow gait pattern.  Fear of L knee buckling with R LE swing through phase of gait.   TODAY'S TREATMENT:                                                                                                                              DATE: 04/24/2024    Subjective:   Pt states that she is doing okay but fatigued today. No c/o pain in lower back. Pt presented in w/c and brought in forearm RW  Pt presents with significant forward head/ kyphotic posture. Pt also states that knees are in pain.    There.act.:    Seated hamstrings stretches at 1st step 4x30 sec.  Manual cuing/ feedback.    //-bars: forwards walking 1 lap total (with UE assist/ CGA).  Focus on upright posture and straightening/ confidence in L knee. Pt is limited in straightening L knee and states they really want to  straighten it  (Pt limited by fatigue).  //-bars: Lateral walking (With UE and CGA): (Pt was unable to laterally walk to the R due to fatigue and L LE weakness). (Pt was able to laterally walk ~ 4 steps to the left before stopping due to weakness/ fatigue)     Seated postural stretches. (Pec 1x10 3sec hold, Horizontal abduction, flexion)  Walks with forearm RW from //-bars to Nautilus ~23 ft. Walk from nautilus to stairs ~66ft. Walk from gym doorway to car ~39 ft  Pt cued to increase upright posture/ head position and focus on L knee confidence extension with wt. Pt. Limited with step length and confidence on L LE.  (With CGA)  Nautilus: seated lat pulldowns 50# 1st set, 60# 2nd set 2x (fatigue).  Focusing on upright posture and opening up spine. Hold at end of set.   Stair taps (with UE assist and CGA) -  Pt is was unable to complete any due to fatigue and lack of confidence in L knee)    PATIENT EDUCATION:  Education details: Daily walking/ posture Person educated: Patient Education method: Medical Illustrator Education comprehension: verbalized understanding and returned demonstration  HOME EXERCISE PROGRAM: Will issue next tx. session  ASSESSMENT:  CLINICAL IMPRESSION:    Pt presents with B LE muscle weakness and limited knee/ quad control. Pt was apprehensive to put weight through L knee due to fear of buckling and weakness. Pt is eager to exercise and wants L knee to fully extended. Pt was able to walk farther throughout the session today walking 39 ft at end of tx. To passenger side of car.  Pt had difficulty with stair taps, and lateral walking due to fatigue and lack of confidence in L LE. Pt. Ambulates better with use of forearm RW as compared to //-bars.  Pt. Will benefit from skilled PT services in combination with gym based ex. To improve LE strengthening/ pain-free mobility.      OBJECTIVE IMPAIRMENTS: Abnormal gait, decreased activity tolerance, decreased balance, decreased endurance, decreased mobility, difficulty walking, decreased ROM, decreased strength, hypomobility, increased edema, impaired flexibility, improper body mechanics, postural dysfunction, and pain.   ACTIVITY LIMITATIONS: bending, sitting, standing, squatting, stairs, transfers, bed mobility, toileting, and locomotion level  PARTICIPATION LIMITATIONS: medication management  PERSONAL FACTORS: Fitness and Past/current experiences are also affecting patient's functional outcome.   REHAB POTENTIAL: Good  CLINICAL DECISION MAKING: Evolving/moderate complexity  EVALUATION COMPLEXITY: Moderate   GOALS: Goals reviewed with patient? Yes  SHORT TERM GOALS: Target date: 04/20/24  Pt. Will be independent with HEP to increase B LE muscle strength 1/2 muscle grade to improve transfer/ safety with  walking.  Baseline:  see above Goal status: Not met  2.  Pt. Will complete 5xSTS with UE assist and no rest breaks to improve walking endurance.   Baseline: TBD Goal status: On-going  LONG TERM GOALS: Target date: 05/11/24  Pt. Will increase LEFS >30 out of 80 to improve functional mobility.    Baseline: 14 out of 80 Goal status: INITIAL  2.  Pt. Able to stand from 21# chair height with no UE assist or low back pain to improve transfers/ independence with daily activity.   Baseline: requires armrests/ UE assist.  Goal status: INITIAL  3.  Pt. Able to return to gym based ex. Program with no limitations or c/o low back pain.   Baseline: c/o LE muscle weakness Goal status: INITIAL  PLAN:  PT FREQUENCY: 2x/week  PT DURATION: 6 weeks  PLANNED INTERVENTIONS:  Therapeutic exercises, Therapeutic activity, Neuromuscular re-education, Balance training, Gait training, Patient/Family education, Self Care, Joint mobilization, DME instructions, Dry Needling, Electrical stimulation, Cryotherapy, Moist heat, and Manual therapy.  PLAN FOR NEXT SESSION: ISSUE HEP/ 5xSTS with UE assist.  Ozell JAYSON Sero, PT, DPT # [id number] [place]ainer, SPT 04/24/2024, 1:13 PM  "

## 2024-04-28 ENCOUNTER — Ambulatory Visit: Admitting: Physical Therapy

## 2024-04-30 ENCOUNTER — Ambulatory Visit: Admitting: Physical Therapy

## 2024-05-05 ENCOUNTER — Ambulatory Visit: Admitting: Physical Therapy

## 2024-05-07 ENCOUNTER — Ambulatory Visit: Admitting: Physical Therapy

## 2024-05-12 ENCOUNTER — Ambulatory Visit: Admitting: Physical Therapy

## 2024-05-14 ENCOUNTER — Ambulatory Visit: Admitting: Physical Therapy
